# Patient Record
Sex: Female | Born: 1950 | Race: White | Hispanic: No | Marital: Married | Smoking: Never smoker
Health system: Southern US, Community
[De-identification: ages and names within clinical notes are randomized; demographics above are authoritative.]

## PROBLEM LIST (undated history)

## (undated) DIAGNOSIS — C569 Malignant neoplasm of unspecified ovary: Principal | ICD-10-CM

## (undated) DIAGNOSIS — R1900 Intra-abdominal and pelvic swelling, mass and lump, unspecified site: Secondary | ICD-10-CM

## (undated) DIAGNOSIS — N189 Chronic kidney disease, unspecified: Secondary | ICD-10-CM

## (undated) DIAGNOSIS — I499 Cardiac arrhythmia, unspecified: Secondary | ICD-10-CM

## (undated) DIAGNOSIS — I509 Heart failure, unspecified: Secondary | ICD-10-CM

## (undated) DIAGNOSIS — I5030 Unspecified diastolic (congestive) heart failure: Secondary | ICD-10-CM

## (undated) DIAGNOSIS — D649 Anemia, unspecified: Secondary | ICD-10-CM

## (undated) DIAGNOSIS — I10 Essential (primary) hypertension: Secondary | ICD-10-CM

## (undated) DIAGNOSIS — Z1379 Encounter for other screening for genetic and chromosomal anomalies: Secondary | ICD-10-CM

## (undated) HISTORY — DX: Malignant neoplasm of unspecified ovary: C56.9

## (undated) HISTORY — DX: Chronic kidney disease, unspecified: N18.9

## (undated) HISTORY — PX: APPENDECTOMY: SHX54

## (undated) HISTORY — DX: Intra-abdominal and pelvic swelling, mass and lump, unspecified site: R19.00

## (undated) HISTORY — DX: Anemia, unspecified: D64.9

## (undated) HISTORY — DX: Heart failure, unspecified: I50.9

## (undated) HISTORY — DX: Encounter for other screening for genetic and chromosomal anomalies: Z13.79

---

## 2006-10-04 ENCOUNTER — Emergency Department: Payer: Self-pay | Admitting: Emergency Medicine

## 2016-11-13 ENCOUNTER — Encounter: Payer: Self-pay | Admitting: Emergency Medicine

## 2016-11-13 ENCOUNTER — Emergency Department
Admission: EM | Admit: 2016-11-13 | Discharge: 2016-11-13 | Disposition: A | Discharge status: Home / Self Care | Attending: Student in an Organized Health Care Education/Training Program | Admitting: Student in an Organized Health Care Education/Training Program

## 2016-11-13 ENCOUNTER — Emergency Department

## 2016-11-13 DIAGNOSIS — R1031 Right lower quadrant pain: Secondary | ICD-10-CM | POA: Insufficient documentation

## 2016-11-13 DIAGNOSIS — R1903 Right lower quadrant abdominal swelling, mass and lump: Secondary | ICD-10-CM | POA: Diagnosis not present

## 2016-11-13 LAB — COMPREHENSIVE METABOLIC PANEL
ALBUMIN: 4 g/dL (ref 3.5–5.0)
ALK PHOS: 84 U/L (ref 38–126)
ALT: 8 U/L — ABNORMAL LOW (ref 14–54)
ANION GAP: 7 (ref 5–15)
AST: 33 U/L (ref 15–41)
BILIRUBIN TOTAL: 0.7 mg/dL (ref 0.3–1.2)
BUN: 8 mg/dL (ref 6–20)
CHLORIDE: 100 mmol/L — AB (ref 101–111)
CO2: 25 mmol/L (ref 22–32)
Calcium: 10 mg/dL (ref 8.9–10.3)
Creatinine, Ser: 0.5 mg/dL (ref 0.44–1.00)
GFR calc Af Amer: >60 mL/min (ref >60)
GFR calc non Af Amer: >60 mL/min (ref >60)
Glucose, Bld: 92 mg/dL (ref 65–99)
Potassium: 4.2 mmol/L (ref 3.5–5.1)
Sodium: 132 mmol/L — ABNORMAL LOW (ref 135–145)
Total Protein: 8.3 g/dL — ABNORMAL HIGH (ref 6.5–8.1)

## 2016-11-13 LAB — URINALYSIS, COMPLETE (UACMP) WITH MICROSCOPIC
Bacteria, UA: NONE SEEN
Bilirubin Urine: NEGATIVE
GLUCOSE, UA: NEGATIVE mg/dL
KETONES UR: NEGATIVE mg/dL
LEUKOCYTES UA: NEGATIVE
Nitrite: NEGATIVE
PROTEIN: NEGATIVE mg/dL
SQUAMOUS EPITHELIAL / LPF: NONE SEEN
Specific Gravity, Urine: 1.008 (ref 1.005–1.030)
pH: 7 (ref 5.0–8.0)

## 2016-11-13 LAB — CBC
HCT: 30.5 % — ABNORMAL LOW (ref 35.0–47.0)
HEMOGLOBIN: 10.4 g/dL — AB (ref 12.0–16.0)
MCH: 28.5 pg (ref 26.0–34.0)
MCHC: 34 g/dL (ref 32.0–36.0)
MCV: 83.9 fL (ref 80.0–100.0)
Platelets: 300 10*3/uL (ref 150–440)
RBC: 3.64 MIL/uL — ABNORMAL LOW (ref 3.80–5.20)
RDW: 14.6 % — ABNORMAL HIGH (ref 11.5–14.5)
WBC: 4.8 10*3/uL (ref 3.6–11.0)

## 2016-11-13 LAB — LIPASE, BLOOD: Lipase: 26 U/L (ref 11–51)

## 2016-11-13 MED ORDER — HYDROCODONE-ACETAMINOPHEN 5-325 MG PO TABS
1.0000 | ORAL_TABLET | Freq: Once | ORAL | Status: AC
Start: 2016-11-13 — End: 2016-11-13
  Administered 2016-11-13: 1 via ORAL
  Filled 2016-11-13: qty 1

## 2016-11-13 MED ORDER — IOPAMIDOL (ISOVUE-300) INJECTION 61%
75.0000 mL | Freq: Once | INTRAVENOUS | Status: AC | PRN
  Administered 2016-11-13: 75 mL via INTRAVENOUS
  Filled 2016-11-13: qty 75

## 2016-11-13 MED ORDER — ONDANSETRON HCL 4 MG PO TABS: 4.0000 mg | ORAL_TABLET | Freq: Every day | ORAL | 0 refills | Status: DC | PRN

## 2016-11-13 MED ORDER — SODIUM CHLORIDE 0.9 % IV BOLUS (SEPSIS)
1000.0000 mL | Freq: Once | INTRAVENOUS | Status: AC
  Administered 2016-11-13: 1000 mL via INTRAVENOUS

## 2016-11-13 MED ORDER — FENTANYL CITRATE (PF) 100 MCG/2ML IJ SOLN
50.0000 ug | INTRAMUSCULAR | Status: DC | PRN
  Administered 2016-11-13: 50 ug via INTRAVENOUS
  Filled 2016-11-13: qty 2

## 2016-11-13 MED ORDER — IOPAMIDOL (ISOVUE-300) INJECTION 61%
30.0000 mL | Freq: Once | INTRAVENOUS | Status: AC | PRN
  Administered 2016-11-13: 30 mL via ORAL
  Filled 2016-11-13: qty 30

## 2016-11-13 MED ORDER — ONDANSETRON HCL 4 MG/2ML IJ SOLN
4.0000 mg | Freq: Once | INTRAMUSCULAR | Status: AC
  Administered 2016-11-13: 4 mg via INTRAVENOUS
  Filled 2016-11-13: qty 2

## 2016-11-13 MED ORDER — ACETAMINOPHEN 325 MG PO TABS
650.0000 mg | ORAL_TABLET | Freq: Once | ORAL | Status: AC
  Administered 2016-11-13: 650 mg via ORAL
  Filled 2016-11-13: qty 2

## 2016-11-13 MED ORDER — TRAMADOL HCL 50 MG PO TABS
ORAL_TABLET | ORAL | Status: DC
Start: 2016-11-13 — End: 2016-11-13
  Filled 2016-11-13: qty 1

## 2016-11-13 MED ORDER — TRAMADOL HCL 50 MG PO TABS: 50.0000 mg | ORAL_TABLET | Freq: Four times a day (QID) | ORAL | 0 refills | Status: DC | PRN

## 2016-11-13 MED ORDER — TRAMADOL HCL 50 MG PO TABS
50.0000 mg | ORAL_TABLET | Freq: Once | ORAL | Status: AC
Start: 2016-11-13 — End: 2016-11-13
  Administered 2016-11-13: 50 mg via ORAL

## 2016-11-13 NOTE — ED Triage Notes (Signed)
Pt to ed with c/o right side abd pain intermittent x 3 weeks.  Also reports mucus in diarrhea.  Pt denies n/v.  Pt alert and oriented, rates abd pain at this time 8/10.

## 2016-11-13 NOTE — ED Provider Notes (Signed)
[facility] Emergency Department Provider Note    First MD Initiated Contact with Patient 11/13/16 1458     (approximate)  I have reviewed the triage vital signs and the nursing notes.   HISTORY  Chief Complaint Abdominal Pain    HPI Katie Hill is a 65 y.o. female presents due to complaint of 3 weeks of intermittent right lower quadrant abdominal pain described as a shooting pain right under her previous appendectomy scar. Patient states she's also been struggling to use the bathroom. Is normally regular about 2-3 weeks ago started having issues with constipation. Denies any blood or melena. Denies any fevers. Does feel nauseated but no vomiting. Denies any chest pain or shortness of breath.   History reviewed. No pertinent past medical history. History reviewed. No pertinent family history. History reviewed. No pertinent surgical history. There are no active problems to display for this patient.     Prior to Admission medications   Medication Sig Start Date End Date Taking? Authorizing Provider  ondansetron (ZOFRAN) 4 MG tablet Take 1 tablet (4 mg total) by mouth daily as needed for nausea or vomiting. 11/13/16 11/13/17  Merlyn Lot, MD  traMADol (ULTRAM) 50 MG tablet Take 1 tablet (50 mg total) by mouth every 6 (six) hours as needed. 11/13/16 11/13/17  Merlyn Lot, MD    Allergies Patient has no known allergies.    Social History Social History  Substance Use Topics  . Smoking status: Never Smoker  . Smokeless tobacco: Never Used  . Alcohol use Yes    Review of Systems Patient denies headaches, rhinorrhea, blurry vision, numbness, shortness of breath, chest pain, edema, cough, abdominal pain, nausea, vomiting, diarrhea, dysuria, fevers, rashes or hallucinations unless otherwise stated above in HPI. ____________________________________________   PHYSICAL EXAM:  VITAL SIGNS: Vitals:   11/13/16 1406  BP: (!) 116/58  Pulse: 83   Resp: 16  Temp: 98.1 F (36.7 C)    Constitutional: Alert and oriented. Well appearing and in no acute distress. Eyes: Conjunctivae are normal.  Head: Atraumatic. Nose: No congestion/rhinnorhea. Mouth/Throat: Mucous membranes are moist.   Neck: No stridor. Painless ROM.  Cardiovascular: Normal rate, regular rhythm. Grossly normal heart sounds.  Good peripheral circulation. Respiratory: Normal respiratory effort.  No retractions. Lungs CTAB. Gastrointestinal: Soft with mild ttp of RLQ, no guarding or rebound. No distention. No abdominal bruits. No CVA tenderness. Musculoskeletal: No lower extremity tenderness nor edema.  No joint effusions. Neurologic:  Normal speech and language. No gross focal neurologic deficits are appreciated. No facial droop Skin:  Skin is warm, dry and intact. No rash noted. Psychiatric: Mood and affect are normal. Speech and behavior are normal.  ____________________________________________   LABS (all labs ordered are listed, but only abnormal results are displayed)  Results for orders placed or performed during the hospital encounter of 11/13/16 (from the past 24 hour(s))  Lipase, blood     Status: None   Collection Time: 11/13/16  2:09 PM  Result Value Ref Range   Lipase 26 11 - 51 U/L  Comprehensive metabolic panel     Status: Abnormal   Collection Time: 11/13/16  2:09 PM  Result Value Ref Range   Sodium 132 (L) 135 - 145 mmol/L   Potassium 4.2 3.5 - 5.1 mmol/L   Chloride 100 (L) 101 - 111 mmol/L   CO2 25 22 - 32 mmol/L   Glucose, Bld 92 65 - 99 mg/dL   BUN 8 6 - 20 mg/dL   Creatinine, Ser  0.50 0.44 - 1.00 mg/dL   Calcium 10.0 8.9 - 10.3 mg/dL   Total Protein 8.3 (H) 6.5 - 8.1 g/dL   Albumin 4.0 3.5 - 5.0 g/dL   AST 33 15 - 41 U/L   ALT 8 (L) 14 - 54 U/L   Alkaline Phosphatase 84 38 - 126 U/L   Total Bilirubin 0.7 0.3 - 1.2 mg/dL   GFR calc non Af Amer >60 >60 mL/min   GFR calc Af Amer >60 >60 mL/min   Anion gap 7 5 - 15  CBC     Status:  Abnormal   Collection Time: 11/13/16  2:09 PM  Result Value Ref Range   WBC 4.8 3.6 - 11.0 K/uL   RBC 3.64 (L) 3.80 - 5.20 MIL/uL   Hemoglobin 10.4 (L) 12.0 - 16.0 g/dL   HCT 30.5 (L) 35.0 - 47.0 %   MCV 83.9 80.0 - 100.0 fL   MCH 28.5 26.0 - 34.0 pg   MCHC 34.0 32.0 - 36.0 g/dL   RDW 14.6 (H) 11.5 - 14.5 %   Platelets 300 150 - 440 K/uL  Urinalysis, Complete w Microscopic     Status: Abnormal   Collection Time: 11/13/16  2:09 PM  Result Value Ref Range   Color, Urine STRAW (A) YELLOW   APPearance CLEAR (A) CLEAR   Specific Gravity, Urine 1.008 1.005 - 1.030   pH 7.0 5.0 - 8.0   Glucose, UA NEGATIVE NEGATIVE mg/dL   Hgb urine dipstick SMALL (A) NEGATIVE   Bilirubin Urine NEGATIVE NEGATIVE   Ketones, ur NEGATIVE NEGATIVE mg/dL   Protein, ur NEGATIVE NEGATIVE mg/dL   Nitrite NEGATIVE NEGATIVE   Leukocytes, UA NEGATIVE NEGATIVE   RBC / HPF 0-5 0 - 5 RBC/hpf   WBC, UA 0-5 0 - 5 WBC/hpf   Bacteria, UA NONE SEEN NONE SEEN   Squamous Epithelial / LPF NONE SEEN NONE SEEN   ____________________________________________ ___________________________________________  RADIOLOGY  I personally reviewed all radiographic images ordered to evaluate for the above acute complaints and reviewed radiology reports and findings.  These findings were personally discussed with the patient.  Please see medical record for radiology report.  ____________________________________________   PROCEDURES  Procedure(s) performed:  Procedures    Critical Care performed: no ____________________________________________   INITIAL IMPRESSION / ASSESSMENT AND PLAN / ED COURSE  Pertinent labs & imaging results that were available during my care of the patient were reviewed by me and considered in my medical decision making (see chart for details).  DDX: appendicitis, stone, pyelo, msk strain, shingles, colitis, diverticulitis,  mass  Katie Hill is a 66 y.o. who presents to the ED with right-sided  abdominal pain as described above.   CT imaging ordered to evaluate the above differential due to the patient's pain. Unfortunately initial CT scan was limited as was ordered as a CT with contrast but the contrast extravasated. CT does show evidence of large probable uterine mass but unable to fully characterize. After discussing with radiology will order complete CT imaging of the chest abdomen and pelvis due to the likelihood that this is malignant process.  The patient will be placed on continuous pulse oximetry and telemetry for monitoring.  Laboratory evaluation will be sent to evaluate for the above complaints.      Clinical Course as of Nov 14 2014  Mon Nov 13, 2016  1931 I discussed the imaging with the patient and her husband at bedside. I have also spoken with Dr. Kenton Kingfisher of OB/GYN who  kindly agrees to see patient in his clinic tomorrow 1. She not having any urinary retention. There is no evidence of bowel obstruction. Pain seems to be well-controlled. She does not have any lower extremity edema to suggest critical stenosis of the IVC.  [PR]    Clinical Course User Index [PR] Merlyn Lot, MD   ----------------------------------------- 8:12 PM on 11/13/2016 -----------------------------------------  Patient requesting discharge home at this point. States pain is controlled. She is tolerating oral hydration and in no acute distress with close follow-up arranged tomorrow I do feel that this is an appropriate option. Discussed signs and symptoms for which she should return immediately to the hospital.  Have discussed with the patient and available family all diagnostics and treatments performed thus far and all questions were answered to the best of my ability. The patient demonstrates understanding and agreement with plan.  ____________________________________________   FINAL CLINICAL IMPRESSION(S) / ED DIAGNOSES  Final diagnoses:  Right lower quadrant abdominal pain  Right lower  quadrant abdominal mass      NEW MEDICATIONS STARTED DURING THIS VISIT:  New Prescriptions   ONDANSETRON (ZOFRAN) 4 MG TABLET    Take 1 tablet (4 mg total) by mouth daily as needed for nausea or vomiting.   TRAMADOL (ULTRAM) 50 MG TABLET    Take 1 tablet (50 mg total) by mouth every 6 (six) hours as needed.     Note:  This document was prepared using Dragon voice recognition software and may include unintentional dictation errors.    Merlyn Lot, MD 11/13/16 2016

## 2016-11-13 NOTE — Discharge Instructions (Signed)
Be sure to follow up with Dr. Kenton Kingfisher in clinic tomorrow at 1:00PM.     You have been seen in the emergency department for emergency care. It is important that you contact your own  doctor, specialist or the closest clinic for follow-up care. Please bring this instruction sheet, all medications and X-ray copies with you when you are seen for follow-up care.  Determining the exact cause for all patients with abdominal pain is extremely difficult in the emergency department. Our primary focus is to rule-out immediate life-threatening diseases. If no immediate source of pain is found the definitive diagnosis frequently needs to be determined over time.Many times your primary care physician can determine the cause by following the symptoms over time. Sometimes, specialist are required such as Gastroenterologists, Gynecologists, Urologists or Surgeons. Please return immediately to the Emergency Department for fever>101, Vomiting or Intractable Pain. You should return to the emergency department or see your primary care provider in 12-24hrs if your pain is no better and sooner if your pain becomes worse.

## 2016-11-14 ENCOUNTER — Telehealth: Payer: Self-pay | Admitting: Obstetrics & Gynecology

## 2016-11-14 ENCOUNTER — Ambulatory Visit (INDEPENDENT_AMBULATORY_CARE_PROVIDER_SITE_OTHER): Admitting: Obstetrics & Gynecology

## 2016-11-14 ENCOUNTER — Encounter: Payer: Self-pay | Admitting: Obstetrics & Gynecology

## 2016-11-14 VITALS — BP 124/72 | HR 86 | Ht 67.0 in | Wt 122.0 lb

## 2016-11-14 DIAGNOSIS — R1900 Intra-abdominal and pelvic swelling, mass and lump, unspecified site: Secondary | ICD-10-CM

## 2016-11-14 NOTE — Telephone Encounter (Signed)
Patient is schedule 11/14/16 with [facility]

## 2016-11-14 NOTE — Patient Instructions (Signed)
[place] at [place] at 10:45  Pelvic Mass A pelvic mass is an abnormal growth in the pelvis. The pelvis is the area between your hip bones. It includes the bladder and the rectum in males and females, and also the uterus and ovaries in females. What are the causes? Many things can cause a pelvic mass, including:  Cancer.  Fibroids of the uterus.  Ovarian cysts.  Infection.  Ectopic pregnancy.  What are the signs or symptoms? Symptoms of a pelvic mass may include:  Cramping.  Nausea.  Diarrhea.  Fever.  Vomiting.  Weakness.  Pain in the pelvis, side, or back.  Weight loss.  Constipation.  Problems with vaginal bleeding, including: ? Light or heavy bleeding with or without blood clots. ? Irregular menstruation. ? Pain with menstruation.  Problems with urination, including: ? Frequent urination. ? Inability to empty the bladder completely. ? Urinating very small amounts. ? Pain with urination. ? Bloody urine.  Some pelvic masses do not cause symptoms. How is this diagnosed? To make a diagnosis, your health care provider will need to learn more about the mass. You may have tests or procedures done, such as:  Blood tests.  X-rays.  Ultrasound.  CT scan.  MRI.  A surgery to look inside of your abdomen with cameras (laparoscopy).  A biopsy that is performed with a needle or during laparoscopy or surgery.  In some cases, what seemed like a pelvic mass may actually be something else, such as a mass in one of the organs that are near the pelvis, an infection (abscess) or scar tissue (adhesions) that formed after a surgery. How is this treated? Treatment will depend on the cause of the mass. Follow these instructions at home: What you need to do at home will depend on the cause of the mass. Follow the instructions that your health care provider gives to you. In general:  Keep all follow-up visits as directed by your health care  provider. This is important.  Take medicines only as directed by your health care provider.  Follow any restrictions that are given to you by your health care provider.  Contact a health care provider if:  You develop new symptoms. Get help right away if:  You vomit bright red blood or vomit material that looks like coffee grounds.  You have blood in your stools, or the stools turn black and tarry.  You have an abnormal or increased amount of vaginal bleeding.  You have a fever.  You develop easy bruising or bleeding.  You develop sudden or worsening pain that is not controlled by your medicine.  You feel worsening weakness, or you have a fainting episode.  You feel that the mass has suddenly gotten larger.  You develop severe bloating in your abdomen or your pelvis.  You cannot pass any urine.  You are unable to have a bowel movement. This information is not intended to replace advice given to you by your health care provider. Make sure you discuss any questions you have with your health care provider. Document Released: 07/04/2006 Document Revised: 09/02/2015 Document Reviewed: 11/10/2013 Elsevier Interactive Patient Education  2018 [employer].

## 2016-11-14 NOTE — Progress Notes (Signed)
  HPI: Patient is a 66 y.o. [id number] who LMP was No LMP recorded. Patient is postmenopausal., presents today for a problem visit.  She complains of recent findings of Right adnexal mass by CT - Pelvis.  Pt has had symptoms of right lower quadrant pain for 3-4 weeks, also has had nausea, bloating, no change in weight, urinary frequency, and change in bowel habits with more diffucult and stringy BM's..  Previous evaluation: emergency room visit on 11/13/16. Prior Diagnosis: none. Previous Treatment: none.  PMHx: She  has no past medical history on file. Also,  has a past surgical history that includes Appendectomy., family history is not on file.,  reports that she has never smoked. She has never used smokeless tobacco. She reports that she drinks alcohol. She reports that she does not use drugs.  She has a current medication list which includes the following prescription(s): ondansetron and tramadol. Also, has No Known Allergies.  Review of Systems  Constitutional: Negative for chills, fever and malaise/fatigue.  HENT: Negative for congestion, sinus pain and sore throat.   Eyes: Negative for blurred vision and pain.  Respiratory: Negative for cough and wheezing.   Cardiovascular: Negative for chest pain and leg swelling.  Gastrointestinal: Negative for abdominal pain, constipation, diarrhea, heartburn, nausea and vomiting.  Genitourinary: Negative for dysuria, frequency, hematuria and urgency.  Musculoskeletal: Negative for back pain, joint pain, myalgias and neck pain.  Skin: Negative for itching and rash.  Neurological: Negative for dizziness, tremors and weakness.  Endo/Heme/Allergies: Does not bruise/bleed easily.  Psychiatric/Behavioral: Negative for depression. The patient is not nervous/anxious and does not have insomnia.   All other systems reviewed and are negative.  Objective: BP 124/72 (BP Location: Left Arm, Patient Position: Sitting, Cuff Size: Normal)   Pulse 86   Ht 5\' 7"   (1.702 m)   Wt 122 lb (55.3 kg)   BMI 19.11 kg/m  Physical Exam  Constitutional: She is oriented to person, place, and time. She appears well-developed and well-nourished. No distress.  Abdominal: Normal appearance. There is generalized tenderness and tenderness in the right lower quadrant.  Musculoskeletal: Normal range of motion.  Neurological: She is alert and oriented to person, place, and time.  Skin: Skin is warm and dry.  Psychiatric: She has a normal mood and affect.  Vitals reviewed.  ASSESSMENT/PLAN:    Visit Diagnoses    Pelvic mass in female    -  Primary   Relevant Orders   Ambulatory referral to Gynecologic Oncology    CA125 pending from last night  Counseled as to concern for ovarian cancer and its prognosis and treatment options (more info to be provided by Dr Theora Gianotti tomorrow). Will assist with surgery if able to be done at [facility].  A total of 45 minutes were spent face-to-face with the patient during this encounter and over half of that time dealt with counseling and coordination of care.  Barnett Applebaum, MD, [facility], [facility] 11/14/2016  1:39 PM

## 2016-11-14 NOTE — Telephone Encounter (Signed)
-----   Message from Gae Dry, MD sent at 11/14/2016  7:04 AM EDT ----- Regarding: appt Add to my schedule today at 1:00; pt aware but still can call to get any info you need or to give additional instructions

## 2016-11-15 ENCOUNTER — Other Ambulatory Visit: Payer: Self-pay

## 2016-11-15 ENCOUNTER — Other Ambulatory Visit: Payer: Self-pay | Admitting: Obstetrics and Gynecology

## 2016-11-15 ENCOUNTER — Telehealth: Payer: Self-pay

## 2016-11-15 ENCOUNTER — Inpatient Hospital Stay: Attending: Obstetrics and Gynecology | Admitting: Obstetrics and Gynecology

## 2016-11-15 DIAGNOSIS — C768 Malignant neoplasm of other specified ill-defined sites: Secondary | ICD-10-CM

## 2016-11-15 DIAGNOSIS — C569 Malignant neoplasm of unspecified ovary: Secondary | ICD-10-CM | POA: Insufficient documentation

## 2016-11-15 DIAGNOSIS — Z7982 Long term (current) use of aspirin: Secondary | ICD-10-CM | POA: Insufficient documentation

## 2016-11-15 DIAGNOSIS — R1900 Intra-abdominal and pelvic swelling, mass and lump, unspecified site: Secondary | ICD-10-CM | POA: Insufficient documentation

## 2016-11-15 DIAGNOSIS — G893 Neoplasm related pain (acute) (chronic): Secondary | ICD-10-CM | POA: Insufficient documentation

## 2016-11-15 DIAGNOSIS — M545 Low back pain: Secondary | ICD-10-CM | POA: Insufficient documentation

## 2016-11-15 DIAGNOSIS — Z79899 Other long term (current) drug therapy: Secondary | ICD-10-CM | POA: Insufficient documentation

## 2016-11-15 DIAGNOSIS — E871 Hypo-osmolality and hyponatremia: Secondary | ICD-10-CM | POA: Insufficient documentation

## 2016-11-15 DIAGNOSIS — Z5111 Encounter for antineoplastic chemotherapy: Secondary | ICD-10-CM | POA: Insufficient documentation

## 2016-11-15 DIAGNOSIS — D649 Anemia, unspecified: Secondary | ICD-10-CM | POA: Insufficient documentation

## 2016-11-15 NOTE — Progress Notes (Signed)
Gynecologic Oncology Consult Visit   Referring Provider: Dr. Barnett Applebaum  Chief Concern: symptomatic right adenexal mass  Subjective:  Katie Hill is a 66 y.o. G0P0 female who is seen in consultation from Dr. Kenton Kingfisher for symptomatic right adnexal mass .  She is a usually healthy patient who presented to the ER 11/13/2016 with symptoms of right lower quadrant pain for 3-4 weeks, associated with nausea, bloating, no change in weight, urinary frequency, and change in bowel habits with more diffucult and stringy BM's.  CT CHEST, ABDOMEN, AND PELVIS WITH CONTRAST  Cardiovascular: The heart size is normal. No pericardial effusion. No thoracic aortic aneurysm.  Mediastinum/Nodes: No mediastinal lymphadenopathy. There is no hilar lymphadenopathy. There is no axillary lymphadenopathy. The esophagus has normal imaging features.  Lungs/Pleura: Biapical pleural-parenchymal scarring evident. Atelectasis noted in the dependent lung bases. 7 mm left lower lobe pulmonary nodule seen on image 89. Several tiny subpleural nodules are seen in the extreme right lung base (image 115 series 4).  Musculoskeletal: Bone windows reveal no worrisome lytic or sclerotic osseous lesions.  Hepatobiliary: No focal abnormality within the liver parenchyma. There is no evidence for gallstones, gallbladder wall thickening, or pericholecystic fluid. No intrahepatic or extrahepatic biliary dilation.  Pancreas: No focal mass lesion. No dilatation of the main duct. No intraparenchymal cyst. No peripancreatic edema.  Spleen: No splenomegaly. No focal mass lesion.  Adrenals/Urinary Tract: No adrenal nodule or mass. 5 mm low-density cortical lesion interpolar right kidney likely a cyst. Left kidney unremarkable. Ureters are unremarkable. Bladder is displaced by the large central pelvic mass described below.  Stomach/Bowel: Stomach is nondistended. No gastric wall thickening. No evidence of outlet  obstruction. Duodenum is normally positioned as is the ligament of Treitz. No small bowel wall thickening. No small bowel dilatation. The terminal ileum is normal. The appendix is not visualized, but there is no edema or inflammation in the region of the cecum. Colon is unremarkable except for the mid sigmoid segment that is draped across the cranial aspect of the large central pelvic mass. In this region there is focal wall thickening and imaging features are suspicious for invasion of the colonic wall in the mid sigmoid colon (see images 43 and 44 of coronal series 6 which correspond to image 96 and 97 of axial series 2).  Vascular/Lymphatic: No abdominal aortic aneurysm. Bulky necrotic retroperitoneal lymphadenopathy is evident. 5.3 x 4.4 cm right para-aortic nodal mass markedly displaces the IVC posteriorly, generating prominent mass-effect on the vessel. Left para-aortic nodal conglomeration just caudal to the left renal vein measures 4.7 x 3.7 cm. 4.3 x 4.8 cm necrotic right common iliac lymph node is evident. Numerous other smaller retroperitoneal lymph nodes are evident. There is a necrotic nodal mass in the left pelvic sidewall measuring 4.8 x 2.6 cm on image 103 of series 2. The right external iliac vein is markedly attenuated along the right pelvic sidewall and patency of this vessel cannot be confirmed by CT. Similar marked attenuation of the left common iliac vein noted due to mass-effect from the central pelvic mass and the left pelvic sidewall lymphadenopathy. 2.2 cm necrotic left internal iliac lymph node is visualized on image 93.  Reproductive: 9.6 x 11.2 x 12.6 cm heterogeneous cystic and solid or solid necrotic mass identified in the central pelvis. Axial imaging suggests at the left gonadal vessels track into the left lateral aspect of this lesion raising the question of left ovarian origin. The right gonadal veins are less clearly associated with this  lesion although the lesion does appear more bulky in the right anatomic pelvis. Difficult to assess on sagittal reformations, but while this central pelvic mass generates substantial mass-effect on the uterus, some images suggest that it is separate from the uterus.  Other: Small amount of free fluid is identified in the pelvis.  Musculoskeletal: Bone windows reveal no worrisome lytic or sclerotic osseous lesions.  IMPRESSION: 1. Large heterogeneous central pelvic mass measures up to 12.6 cm. The lesion generates substantial mass-effect on the uterus, bladder, and pelvic sidewall anatomy. The mass appears to invade the mid sigmoid colon. Etiology of the central pelvic mass is not definitive by CT, but ovarian primary is suspected. The lesion becomes indistinguishable from the posterior uterus on some images and uterine origin is possible, but the uterus does not appear to be the epicenter of the mass and appears to be more displaced by it. MRI of the pelvis without and with contrast may prove helpful to better delineate the relationship of the uterus to the central pelvic mass although it may not be able to definitively localize the origin. 2. Bulky retroperitoneal and left pelvic sidewall lymphadenopathy. The retroperitoneal lymphadenopathy generates substantial mass-effect on the IVC and left renal vein. The external iliac veins along each pelvic sidewall are markedly attenuated by the mass/lymphadenopathy. Patency of these vessels cannot be definitely confirmed on this exam.  She does not have regular medical cancer and has not has screening mammogram, colonoscopy, or Pap smears. She has not had an abnormal Pap.   Problem List: Patient Active Problem List   Diagnosis Date Noted  . Pelvic mass 11/15/2016    Past Medical History: Past Medical History:  Diagnosis Date  . Pelvic mass in female     Past Surgical History: Past Surgical History:  Procedure Laterality  Date  . APPENDECTOMY      Past Gynecologic History:  Menarche: 17 Last Menstrual Period: 24 years ago History of Abnormal pap: no Last pap: years ago   OB History:  OB History  Gravida Para Term Preterm AB Living  0 0 0 0 0 0  SAB TAB Ectopic Multiple Live Births  0 0 0 0 0        Family History: Family History  Problem Relation Age of Onset  . Throat cancer Cousin   . Throat cancer Cousin     Social History: Social History   Social History  . Marital status: Married    Spouse name: N/A  . Number of children: N/A  . Years of education: N/A   Occupational History  . Not on file.   Social History Main Topics  . Smoking status: Never Smoker  . Smokeless tobacco: Never Used  . Alcohol use Yes     Comment: occasional   . Drug use: No  . Sexual activity: Yes    Birth control/ protection: Post-menopausal   Other Topics Concern  . Not on file   Social History Narrative  . No narrative on file    Allergies: No Known Allergies  Current Medications: Current Outpatient Prescriptions  Medication Sig Dispense Refill  . ondansetron (ZOFRAN) 4 MG tablet Take 1 tablet (4 mg total) by mouth daily as needed for nausea or vomiting. 14 tablet 0  . traMADol (ULTRAM) 50 MG tablet Take 1 tablet (50 mg total) by mouth every 6 (six) hours as needed. 20 tablet 0   No current facility-administered medications for this visit.     Review of Systems General: no complaints  HEENT: no complaints  Lungs: no complaints  Cardiac: no complaints  GI: constipation; RLQ pelvic pain  GU: urinary incontinence and frequency with incomplete emptying of the bladder  Musculoskeletal: back pain  Extremities: no complaints  Skin: no complaints  Neuro: no complaints  Endocrine: no complaints  Psych: no complaints       Objective:  Physical Examination:  BP 114/68   Pulse 77   Temp 98.4 F (36.9 C) (Tympanic)   Resp 18   Ht 5\' 7"  (1.702 m)   Wt 118 lb 12.8 oz (53.9 kg)    BMI 18.61 kg/m    ECOG Performance Status: 1 - Symptomatic but completely ambulatory  General appearance: alert, cooperative and appears stated age HEENT:PERRLA, extra ocular movement intact and sclera clear, anicteric Lymph node survey: non-palpable, axillary, inguinal, supraclavicular Cardiovascular: regular rate and rhythm Respiratory: normal air entry, lungs clear to auscultation Breast exam: breasts appear normal, no suspicious masses, no skin or nipple changes or axillary nodes. Abdomen: soft, nondistended, tenderness moderate in the RLQ, without guarding, without rebound, mass located in the RLQ, no hepatosplenomegaly, no hernias and well healed incision Back: inspection of back is normal Extremities: extremities normal, atraumatic, no cyanosis or edema Skin exam - normal coloration and turgor, no rashes, no suspicious skin lesions noted. Neurological exam reveals alert, oriented, normal speech, no focal findings or movement disorder noted.  Pelvic: exam chaperoned by nurse;  Vulva: normal appearing vulva with no masses, tenderness or lesions; Vagina: normal vagina; Adnexa: tenderness right with large immobile mass present right side, size 12-14 cm; Uterus: unable to determine size due to mass ; Cervix: no lesions; Rectal: confirms impingement of the mass on the large bowel and limited mobility.    Lab Review Labs on site today: pending  Radiologic Imaging: reviewed    Assessment:  Katie Hill is a 67 y.o. 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 Exam concerning for limited mobility of mass and pelvic structures and may indicate unresectable disease.    Medical co-morbidities complicating care: prior abdominal surgery.  Plan:   Problem List Items Addressed This Visit      Other   Pelvic mass   Relevant Orders   CT Biopsy   Protime-INR   APTT    CEA   CA 125   Pap liquid-based and HPV (high risk)      We discussed options for management and recommended radiologic guided biopsy asap with tumor markers. Based on these findings we can proceed with neoadjuvant therapy. If ovarian cancer anticipate 3-4 cycles of paclitaxel/platin based therapy with interval debulking. Bevacizumab would not be advisable given concern for bowel involvement.   The patient's diagnosis, an outline of the further diagnostic and laboratory studies which will be required, the recommendation, and alternatives were discussed.  All questions were answered to the patient's satisfaction.     Gillis Ends, MD    CC:  [address],  [address], [place] [id number]

## 2016-11-15 NOTE — Progress Notes (Signed)
  Oncology Nurse Navigator Documentation Orders placed for STAT Ct guided biopsy. Lab orders placed. Provided contact information for any future questions after today. Navigator Location: [facility] (11/15/16 1200)   )Navigator Encounter Type: Initial GynOnc (11/15/16 1100)                                                    Time Spent with Patient: 60 (11/15/16 1100)

## 2016-11-15 NOTE — Telephone Encounter (Signed)
  Oncology Nurse Navigator Documentation Notified Katie Hill to come to cancer center at [place] on 8/9 for labs then she [facility] guided biopsy at 0900 for 1000 scheduled biopsy time. Educated to remain npo for 8 hours before. She may take any heart or bp medications in the early am with sip of water. Denies taking any of these medications. Instructed that she will need driver for this biopsy. Read back performed. Navigator Location: [facility] (11/15/16 1700)   )                                                     Time Spent with Patient: 15 (11/15/16 1700)

## 2016-11-15 NOTE — Patient Instructions (Signed)
Constipation, Adult °Constipation is when a person: °· Poops (has a bowel movement) fewer times in a week than normal. °· Has a hard time pooping. °· Has poop that is dry, hard, or bigger than normal. ° °Follow these instructions at home: °Eating and drinking ° °· Eat foods that have a lot of fiber, such as: °? Fresh fruits and vegetables. °? Whole grains. °? Beans. °· Eat less of foods that are high in fat, low in fiber, or overly processed, such as: °? French fries. °? Hamburgers. °? Cookies. °? Candy. °? Soda. °· Drink enough fluid to keep your pee (urine) clear or pale yellow. °General instructions °· Exercise regularly or as told by your doctor. °· Go to the restroom when you feel like you need to poop. Do not hold it in. °· Take over-the-counter and prescription medicines only as told by your doctor. These include any fiber supplements. °· Do pelvic floor retraining exercises, such as: °? Doing deep breathing while relaxing your lower belly (abdomen). °? Relaxing your pelvic floor while pooping. °· Watch your condition for any changes. °· Keep all follow-up visits as told by your doctor. This is important. °Contact a doctor if: °· You have pain that gets worse. °· You have a fever. °· You have not pooped for 4 days. °· You throw up (vomit). °· You are not hungry. °· You lose weight. °· You are bleeding from the anus. °· You have thin, pencil-like poop (stool). °Get help right away if: °· You have a fever, and your symptoms suddenly get worse. °· You leak poop or have blood in your poop. °· Your belly feels hard or bigger than normal (is bloated). °· You have very bad belly pain. °· You feel dizzy or you faint. °This information is not intended to replace advice given to you by your health care provider. Make sure you discuss any questions you have with your health care provider. °Document Released: 09/13/2007 Document Revised: 10/15/2015 Document Reviewed: 09/15/2015 °[employer]vier Interactive Patient Education ©  2017 [employer] ° °

## 2016-11-15 NOTE — Progress Notes (Signed)
Pt here for pelvic mass- started having pain in pelvic and went to ER, then got sent to [facility] and then referred to gyn

## 2016-11-16 ENCOUNTER — Inpatient Hospital Stay

## 2016-11-16 ENCOUNTER — Ambulatory Visit: Payer: No Typology Code available for payment source

## 2016-11-16 ENCOUNTER — Ambulatory Visit
Admission: RE | Admit: 2016-11-16 | Discharge: 2016-11-16 | Disposition: A | Discharge status: Home / Self Care | Source: Ambulatory Visit | Attending: Obstetrics and Gynecology | Admitting: Obstetrics and Gynecology

## 2016-11-16 DIAGNOSIS — E871 Hypo-osmolality and hyponatremia: Secondary | ICD-10-CM | POA: Diagnosis not present

## 2016-11-16 DIAGNOSIS — R1031 Right lower quadrant pain: Secondary | ICD-10-CM | POA: Diagnosis not present

## 2016-11-16 DIAGNOSIS — C779 Secondary and unspecified malignant neoplasm of lymph node, unspecified: Secondary | ICD-10-CM | POA: Diagnosis not present

## 2016-11-16 DIAGNOSIS — Z7982 Long term (current) use of aspirin: Secondary | ICD-10-CM | POA: Diagnosis not present

## 2016-11-16 DIAGNOSIS — R1900 Intra-abdominal and pelvic swelling, mass and lump, unspecified site: Secondary | ICD-10-CM | POA: Diagnosis present

## 2016-11-16 DIAGNOSIS — Z79899 Other long term (current) drug therapy: Secondary | ICD-10-CM | POA: Diagnosis not present

## 2016-11-16 DIAGNOSIS — G893 Neoplasm related pain (acute) (chronic): Secondary | ICD-10-CM | POA: Diagnosis not present

## 2016-11-16 DIAGNOSIS — Z9889 Other specified postprocedural states: Secondary | ICD-10-CM | POA: Diagnosis not present

## 2016-11-16 DIAGNOSIS — R590 Localized enlarged lymph nodes: Secondary | ICD-10-CM | POA: Diagnosis not present

## 2016-11-16 DIAGNOSIS — Z5111 Encounter for antineoplastic chemotherapy: Secondary | ICD-10-CM | POA: Diagnosis present

## 2016-11-16 DIAGNOSIS — D649 Anemia, unspecified: Secondary | ICD-10-CM | POA: Diagnosis not present

## 2016-11-16 DIAGNOSIS — Z800 Family history of malignant neoplasm of digestive organs: Secondary | ICD-10-CM | POA: Diagnosis not present

## 2016-11-16 DIAGNOSIS — C768 Malignant neoplasm of other specified ill-defined sites: Secondary | ICD-10-CM | POA: Diagnosis not present

## 2016-11-16 DIAGNOSIS — C569 Malignant neoplasm of unspecified ovary: Secondary | ICD-10-CM | POA: Diagnosis not present

## 2016-11-16 DIAGNOSIS — M545 Low back pain: Secondary | ICD-10-CM | POA: Diagnosis not present

## 2016-11-16 LAB — APTT: aPTT: 31 seconds (ref 24–36)

## 2016-11-16 LAB — PROTIME-INR
INR: 1.07
PROTHROMBIN TIME: 13.9 s (ref 11.4–15.2)

## 2016-11-16 MED ORDER — FENTANYL CITRATE (PF) 100 MCG/2ML IJ SOLN
INTRAMUSCULAR | Status: AC | PRN
  Administered 2016-11-16 (×2): 50 ug via INTRAVENOUS

## 2016-11-16 MED ORDER — SODIUM CHLORIDE 0.9 % IV SOLN
INTRAVENOUS | Status: DC
  Administered 2016-11-16: 11:00:00 via INTRAVENOUS

## 2016-11-16 MED ORDER — HYDROCODONE-ACETAMINOPHEN 5-325 MG PO TABS: 1.0000 | ORAL_TABLET | ORAL | Status: DC | PRN

## 2016-11-16 MED ORDER — FENTANYL CITRATE (PF) 100 MCG/2ML IJ SOLN
INTRAMUSCULAR | Status: AC
  Filled 2016-11-16: qty 4

## 2016-11-16 MED ORDER — MIDAZOLAM HCL 2 MG/2ML IJ SOLN
INTRAMUSCULAR | Status: AC | PRN
  Administered 2016-11-16 (×2): 1 mg via INTRAVENOUS

## 2016-11-16 MED ORDER — MIDAZOLAM HCL 2 MG/2ML IJ SOLN
INTRAMUSCULAR | Status: AC
Start: 2016-11-16 — End: 2016-11-16
  Filled 2016-11-16: qty 4

## 2016-11-16 NOTE — H&P (Signed)
Chief Complaint: Patient was seen in consultation today for pelvic, abdominal and retroperitoneal masses at the request of Gillis Ends  Referring Physician(s): Gillis Ends  Patient Status: [place]  History of Present Illness: Katie Hill is a 66 y.o. female who recently presented to the ER after 3-4 weeks of RLQ pain.  Ct imaging revealed a large pelvic mass and extensive adenopathy.  Today she is in her normal state of health and has no active complaints.   Past Medical History:  Diagnosis Date  . Pelvic mass in female     Past Surgical History:  Procedure Laterality Date  . APPENDECTOMY      Allergies: Patient has no known allergies.  Medications: Prior to Admission medications   Medication Sig Start Date End Date Taking? Authorizing Provider  traMADol (ULTRAM) 50 MG tablet Take 1 tablet (50 mg total) by mouth every 6 (six) hours as needed. 11/13/16 11/13/17 Yes Merlyn Lot, MD  ondansetron (ZOFRAN) 4 MG tablet Take 1 tablet (4 mg total) by mouth daily as needed for nausea or vomiting. 11/13/16 11/13/17  Merlyn Lot, MD     Family History  Problem Relation Age of Onset  . Throat cancer Cousin   . Throat cancer Cousin     Social History   Social History  . Marital status: Married    Spouse name: N/A  . Number of children: N/A  . Years of education: N/A   Social History Main Topics  . Smoking status: Never Smoker  . Smokeless tobacco: Never Used  . Alcohol use Yes     Comment: occasional   . Drug use: No  . Sexual activity: Yes    Birth control/ protection: Post-menopausal   Other Topics Concern  . None   Social History Narrative  . None    ECOG Status: 1 - Symptomatic but completely ambulatory  Review of Systems: A 12 point ROS discussed and pertinent positives are indicated in the HPI above.  All other systems are negative.  Review of Systems  Vital Signs: BP 114/77   Pulse 81   Temp 97.8 F (36.6  C) (Oral)   Resp 12   Ht 5\' 7"  (1.702 m)   Wt 118 lb (53.5 kg)   SpO2 96%   BMI 18.48 kg/m   Physical Exam  Mallampati Score:  MD Evaluation Airway: WNL Heart: WNL Abdomen: WNL Chest/ Lungs: WNL ASA  Classification: 2 Mallampati/Airway Score: One  Imaging: Ct Chest W Contrast  Result Date: 11/13/2016 CLINICAL DATA:  Right side abdominal pain for 3 weeks. Intraabdominal abnormality seen on noncontrast CT earlier today. EXAM: CT CHEST, ABDOMEN, AND PELVIS WITH CONTRAST TECHNIQUE: Multidetector CT imaging of the chest, abdomen and pelvis was performed following the standard protocol during bolus administration of intravenous contrast. CONTRAST:  63mL ISOVUE-300 IOPAMIDOL (ISOVUE-300) INJECTION 61% COMPARISON:  Noncontrast abdomen and pelvis CT from earlier today. FINDINGS: CT CHEST FINDINGS Cardiovascular: The heart size is normal. No pericardial effusion. No thoracic aortic aneurysm. Mediastinum/Nodes: No mediastinal lymphadenopathy. There is no hilar lymphadenopathy. There is no axillary lymphadenopathy. The esophagus has normal imaging features. Lungs/Pleura: Biapical pleural-parenchymal scarring evident. Atelectasis noted in the dependent lung bases. 7 mm left lower lobe pulmonary nodule seen on image 89. Several tiny subpleural nodules are seen in the extreme right lung base (image 115 series 4). Musculoskeletal: Bone windows reveal no worrisome lytic or sclerotic osseous lesions. CT ABDOMEN PELVIS FINDINGS Hepatobiliary: No focal abnormality within the liver parenchyma. There is  no evidence for gallstones, gallbladder wall thickening, or pericholecystic fluid. No intrahepatic or extrahepatic biliary dilation. Pancreas: No focal mass lesion. No dilatation of the main duct. No intraparenchymal cyst. No peripancreatic edema. Spleen: No splenomegaly. No focal mass lesion. Adrenals/Urinary Tract: No adrenal nodule or mass. 5 mm low-density cortical lesion interpolar right kidney likely a cyst.  Left kidney unremarkable. Ureters are unremarkable. Bladder is displaced by the large central pelvic mass described below. Stomach/Bowel: Stomach is nondistended. No gastric wall thickening. No evidence of outlet obstruction. Duodenum is normally positioned as is the ligament of Treitz. No small bowel wall thickening. No small bowel dilatation. The terminal ileum is normal. The appendix is not visualized, but there is no edema or inflammation in the region of the cecum. Colon is unremarkable except for the mid sigmoid segment that is draped across the cranial aspect of the large central pelvic mass. In this region there is focal wall thickening and imaging features are suspicious for invasion of the colonic wall in the mid sigmoid colon (see images 43 and 44 of coronal series 6 which correspond to image 96 and 97 of axial series 2). Vascular/Lymphatic: No abdominal aortic aneurysm. Bulky necrotic retroperitoneal lymphadenopathy is evident. 5.3 x 4.4 cm right para-aortic nodal mass markedly displaces the IVC posteriorly, generating prominent mass-effect on the vessel. Left para-aortic nodal conglomeration just caudal to the left renal vein measures 4.7 x 3.7 cm. 4.3 x 4.8 cm necrotic right common iliac lymph node is evident. Numerous other smaller retroperitoneal lymph nodes are evident. There is a necrotic nodal mass in the left pelvic sidewall measuring 4.8 x 2.6 cm on image 103 of series 2. The right external iliac vein is markedly attenuated along the right pelvic sidewall and patency of this vessel cannot be confirmed by CT. Similar marked attenuation of the left common iliac vein noted due to mass-effect from the central pelvic mass and the left pelvic sidewall lymphadenopathy. 2.2 cm necrotic left internal iliac lymph node is visualized on image 93. Reproductive: 9.6 x 11.2 x 12.6 cm heterogeneous cystic and solid or solid necrotic mass identified in the central pelvis. Axial imaging suggests at the left  gonadal vessels track into the left lateral aspect of this lesion raising the question of left ovarian origin. The right gonadal veins are less clearly associated with this lesion although the lesion does appear more bulky in the right anatomic pelvis. Difficult to assess on sagittal reformations, but while this central pelvic mass generates substantial mass-effect on the uterus, some images suggest that it is separate from the uterus. Other: Small amount of free fluid is identified in the pelvis. Musculoskeletal: Bone windows reveal no worrisome lytic or sclerotic osseous lesions. IMPRESSION: 1. Large heterogeneous central pelvic mass measures up to 12.6 cm. The lesion generates substantial mass-effect on the uterus, bladder, and pelvic sidewall anatomy. The mass appears to invade the mid sigmoid colon. Etiology of the central pelvic mass is not definitive by CT, but ovarian primary is suspected. The lesion becomes indistinguishable from the posterior uterus on some images and uterine origin is possible, but the uterus does not appear to be the epicenter of the mass and appears to be more displaced by it. MRI of the pelvis without and with contrast may prove helpful to better delineate the relationship of the uterus to the central pelvic mass although it may not be able to definitively localize the origin. 2. Bulky retroperitoneal and left pelvic sidewall lymphadenopathy. The retroperitoneal lymphadenopathy generates substantial mass-effect on  the IVC and left renal vein. The external iliac veins along each pelvic sidewall are markedly attenuated by the mass/lymphadenopathy. Patency of these vessels cannot be definitely confirmed on this exam. Electronically Signed   By: Misty Stanley M.D.   On: 11/13/2016 18:56   Ct Abdomen Pelvis W Contrast  Result Date: 11/13/2016 CLINICAL DATA:  Right side abdominal pain for 3 weeks. Intraabdominal abnormality seen on noncontrast CT earlier today. EXAM: CT CHEST, ABDOMEN,  AND PELVIS WITH CONTRAST TECHNIQUE: Multidetector CT imaging of the chest, abdomen and pelvis was performed following the standard protocol during bolus administration of intravenous contrast. CONTRAST:  75mL ISOVUE-300 IOPAMIDOL (ISOVUE-300) INJECTION 61% COMPARISON:  Noncontrast abdomen and pelvis CT from earlier today. FINDINGS: CT CHEST FINDINGS Cardiovascular: The heart size is normal. No pericardial effusion. No thoracic aortic aneurysm. Mediastinum/Nodes: No mediastinal lymphadenopathy. There is no hilar lymphadenopathy. There is no axillary lymphadenopathy. The esophagus has normal imaging features. Lungs/Pleura: Biapical pleural-parenchymal scarring evident. Atelectasis noted in the dependent lung bases. 7 mm left lower lobe pulmonary nodule seen on image 89. Several tiny subpleural nodules are seen in the extreme right lung base (image 115 series 4). Musculoskeletal: Bone windows reveal no worrisome lytic or sclerotic osseous lesions. CT ABDOMEN PELVIS FINDINGS Hepatobiliary: No focal abnormality within the liver parenchyma. There is no evidence for gallstones, gallbladder wall thickening, or pericholecystic fluid. No intrahepatic or extrahepatic biliary dilation. Pancreas: No focal mass lesion. No dilatation of the main duct. No intraparenchymal cyst. No peripancreatic edema. Spleen: No splenomegaly. No focal mass lesion. Adrenals/Urinary Tract: No adrenal nodule or mass. 5 mm low-density cortical lesion interpolar right kidney likely a cyst. Left kidney unremarkable. Ureters are unremarkable. Bladder is displaced by the large central pelvic mass described below. Stomach/Bowel: Stomach is nondistended. No gastric wall thickening. No evidence of outlet obstruction. Duodenum is normally positioned as is the ligament of Treitz. No small bowel wall thickening. No small bowel dilatation. The terminal ileum is normal. The appendix is not visualized, but there is no edema or inflammation in the region of the  cecum. Colon is unremarkable except for the mid sigmoid segment that is draped across the cranial aspect of the large central pelvic mass. In this region there is focal wall thickening and imaging features are suspicious for invasion of the colonic wall in the mid sigmoid colon (see images 43 and 44 of coronal series 6 which correspond to image 96 and 97 of axial series 2). Vascular/Lymphatic: No abdominal aortic aneurysm. Bulky necrotic retroperitoneal lymphadenopathy is evident. 5.3 x 4.4 cm right para-aortic nodal mass markedly displaces the IVC posteriorly, generating prominent mass-effect on the vessel. Left para-aortic nodal conglomeration just caudal to the left renal vein measures 4.7 x 3.7 cm. 4.3 x 4.8 cm necrotic right common iliac lymph node is evident. Numerous other smaller retroperitoneal lymph nodes are evident. There is a necrotic nodal mass in the left pelvic sidewall measuring 4.8 x 2.6 cm on image 103 of series 2. The right external iliac vein is markedly attenuated along the right pelvic sidewall and patency of this vessel cannot be confirmed by CT. Similar marked attenuation of the left common iliac vein noted due to mass-effect from the central pelvic mass and the left pelvic sidewall lymphadenopathy. 2.2 cm necrotic left internal iliac lymph node is visualized on image 93. Reproductive: 9.6 x 11.2 x 12.6 cm heterogeneous cystic and solid or solid necrotic mass identified in the central pelvis. Axial imaging suggests at the left gonadal vessels track into the  left lateral aspect of this lesion raising the question of left ovarian origin. The right gonadal veins are less clearly associated with this lesion although the lesion does appear more bulky in the right anatomic pelvis. Difficult to assess on sagittal reformations, but while this central pelvic mass generates substantial mass-effect on the uterus, some images suggest that it is separate from the uterus. Other: Small amount of free fluid  is identified in the pelvis. Musculoskeletal: Bone windows reveal no worrisome lytic or sclerotic osseous lesions. IMPRESSION: 1. Large heterogeneous central pelvic mass measures up to 12.6 cm. The lesion generates substantial mass-effect on the uterus, bladder, and pelvic sidewall anatomy. The mass appears to invade the mid sigmoid colon. Etiology of the central pelvic mass is not definitive by CT, but ovarian primary is suspected. The lesion becomes indistinguishable from the posterior uterus on some images and uterine origin is possible, but the uterus does not appear to be the epicenter of the mass and appears to be more displaced by it. MRI of the pelvis without and with contrast may prove helpful to better delineate the relationship of the uterus to the central pelvic mass although it may not be able to definitively localize the origin. 2. Bulky retroperitoneal and left pelvic sidewall lymphadenopathy. The retroperitoneal lymphadenopathy generates substantial mass-effect on the IVC and left renal vein. The external iliac veins along each pelvic sidewall are markedly attenuated by the mass/lymphadenopathy. Patency of these vessels cannot be definitely confirmed on this exam. Electronically Signed   By: Misty Stanley M.D.   On: 11/13/2016 18:56   Ct Abdomen Pelvis W Contrast  Result Date: 11/13/2016 CLINICAL DATA:  66 year old female with right-sided intermittent pain for the past 3 weeks. Initial encounter. EXAM: CT ABDOMEN AND PELVIS WITH CONTRAST TECHNIQUE: Multidetector CT imaging of the abdomen and pelvis was performed using the standard protocol following bolus administration of intravenous contrast. CONTRAST:  75 cc Isovue 300. No IV contrast is visualized suggesting extravasation. Discuss with Jinny Blossom, CT technologist. There were no problems during injection. Patient in emergency room. Currently being discussed with Dr. Quentin Cornwall. Exam was ordered without oral contrast. COMPARISON:  None. FINDINGS: Lower  chest: Right lower lobe pleural based 4.9 mm nodule (series 5, image 7) basilar subsegmental atelectasis. Heart size within normal limits. Hepatobiliary: Taking into account limitation by non contrast imaging, no hepatic mass. No calcified gallstones. Pancreas: Taking into account limitation by non contrast imaging, no primary pancreatic mass with surrounding inflammation. Spleen: Taking into account limitation by non contrast imaging, no mass or enlargement. Adrenals/Urinary Tract: No renal or ureteral obstructing stone. There is slight fullness of the renal collecting system which may be caused by mass effect upon the distal ureters by pelvic mass. Taking into account limitation by non contrast imaging, no renal or adrenal mass. Bladder markedly distorted by pelvic mass displaced anteriorly an under filled. Stomach/Bowel: Pelvic mass compresses colon and portions of small bowel. Adenopathy compresses and and anterior displaces duodenum and pancreas. Vascular/Lymphatic: Marked bulky adenopathy retroperitoneal region greater on the right appears partially calcified and is compressing surrounding structures including bowel, pancreas, inferior vena cava and aorta. Transverse dimension of right-sided retroperitoneal adenopathy measuring up to 6 cm. Pelvic adenopathy greatest on the left with left external iliac adenopathy transverse dimension of 4.3 x 2.7 cm. Aorta is partially calcified. At points the aorta is inseparable from the retroperitoneal adenopathy. Reproductive: Large complex pelvic mass spans over 13 x 15 x 12.7 cm and is compressing adjacent bowel and urinary bladder. This  may arise from the uterus with primary adnexal mass a secondary consideration. In addition to the more solid component, there is a cystic component versus loculated fluid within the cul de sac measuring 5.2 x 6 x 5.3 cm. Other: No free intraperitoneal air or bowel containing hernia. Musculoskeletal: Curvature lumbar spine without osseous  destructive lesion. IMPRESSION: Large complex pelvic mass spans over 13 x 15 x 12.7 cm and is compressing adjacent bowel and urinary bladder. This may arise from the uterus with primary adnexal mass a secondary consideration. In addition to the more solid component, there is a cystic component versus loculated fluid within the cul de sac measuring 5.2 x 6 x 5.3 cm. Gynecological consultation may be considered. This mass would be best assessed by pelvic MR (may be difficult with pelvic sonogram given the size). Marked bulky adenopathy retroperitoneal region greater on the right appears partially calcified and is compressing surrounding structures including bowel, pancreas, inferior vena cava and aorta. Transverse dimension of right-sided retroperitoneal adenopathy measuring up to 6 cm. Pelvic adenopathy greatest on the left with left external iliac adenopathy transverse dimension of 4.3 x 2.7 cm. Mild fullness of the renal collecting systems may be secondary to mass-effect upon the distal ureters. 4.9 mm right base pleural based nodule. Patient received contrast although appears to have extravasated. This is currently being treated by Dr. Quentin Cornwall. If contrast-enhanced CT scan is repeated after a new IV is started and patient noted to have normal renal function study, full ingestion of oral contrast recommended. CT scan of the chest, abdomen and pelvis may be performed. These results were called by telephone at the time of interpretation on 11/13/2016 at 4:06 pm to Dr. Merlyn Lot , who verbally acknowledged these results. Electronically Signed   By: Genia Del M.D.   On: 11/13/2016 16:37    Labs:  CBC:  Recent Labs  11/13/16 1409  WBC 4.8  HGB 10.4*  HCT 30.5*  PLT 300    COAGS:  Recent Labs  11/16/16 0847  INR 1.07  APTT 31    BMP:  Recent Labs  11/13/16 1409  NA 132*  K 4.2  CL 100*  CO2 25  GLUCOSE 92  BUN 8  CALCIUM 10.0  CREATININE 0.50  GFRNONAA >60  GFRAA >60     LIVER FUNCTION TESTS:  Recent Labs  11/13/16 1409  BILITOT 0.7  AST 33  ALT 8*  ALKPHOS 84  PROT 8.3*  ALBUMIN 4.0    TUMOR MARKERS: No results for input(s): AFPTM, CEA, CA199, CHROMGRNA in the last 8760 hours.  Assessment and Plan:  Newly diagnosed extensive pelvic, abdominal and retroperitoneal adenopathy.  Imaging findings concerning for lymphoma versus metastases from an ovarian or other gyn primary.   1.) CT guided biopsy of left retroperitoneal mass with moderate sedation.  Risks, benefits and alternatives discussed at length and questions were answered.  Pt understands and desires to proceed.   Thank you for this interesting consult.  I greatly enjoyed meeting Katie Hill and look forward to participating in their care.  A copy of this report was sent to the requesting provider on this date.  Electronically Signed: Jacqulynn Cadet, MD 11/16/2016, 10:34 AM   I spent a total of  15 Minutes   in face to face in clinical consultation, greater than 50% of which was counseling/coordinating care for liver mass.

## 2016-11-16 NOTE — Procedures (Signed)
Interventional Radiology Procedure Note  Procedure: CT guided bx of left retroperitoneal mass.   Complications: None  Estimated Blood Loss: None  Recommendations:  - Bedrest x 2 hrs - DC home  Signed,  Criselda Peaches, MD

## 2016-11-16 NOTE — Discharge Instructions (Signed)
Needle Biopsy, Care After °Refer to this sheet in the next few weeks. These instructions provide you with information about caring for yourself after your procedure. Your health care provider may also give you more specific instructions. Your treatment has been planned according to current medical practices, but problems sometimes occur. Call your health care provider if you have any problems or questions after your procedure. °What can I expect after the procedure? °After your procedure, it is common to have soreness, bruising, or mild pain at the biopsy site. This should go away in a few days. °Follow these instructions at home: °· Rest as directed by your health care provider. °· Take medicines only as directed by your health care provider. °· There are many different ways to close and cover the biopsy site, including stitches (sutures), skin glue, and adhesive strips. Follow your health care provider's instructions about: °? Biopsy site care. °? Bandage (dressing) changes and removal. °? Biopsy site closure removal. °· Check your biopsy site every day for signs of infection. Watch for: °? Redness, swelling, or pain. °? Fluid, blood, or pus. °Contact a health care provider if: °· You have a fever. °· You have redness, swelling, or pain at the biopsy site that lasts longer than a few days. °· You have fluid, blood, or pus coming from the biopsy site. °· You feel nauseous. °· You vomit. °Get help right away if: °· You have shortness of breath. °· You have trouble breathing. °· You have chest pain. °· You feel dizzy or you faint. °· You have bleeding that does not stop with pressure or a bandage. °· You cough up blood. °· You have pain in your abdomen. °This information is not intended to replace advice given to you by your health care provider. Make sure you discuss any questions you have with your health care provider. °Document Released: 08/11/2014 Document Revised: 09/02/2015 Document Reviewed:  03/23/2014 °Elsevier Interactive Patient Education © 2018 [employer] ° °

## 2016-11-17 ENCOUNTER — Telehealth: Payer: Self-pay

## 2016-11-17 LAB — PAP LB AND HPV HIGH-RISK
HPV, high-risk: NEGATIVE
PAP SMEAR COMMENT: 0

## 2016-11-17 LAB — SURGICAL PATHOLOGY

## 2016-11-17 LAB — CA 125: Cancer Antigen (CA) 125: 4382 U/mL — ABNORMAL HIGH (ref 0.0–38.1)

## 2016-11-17 LAB — CEA: CEA: 0.7 ng/mL (ref 0.0–4.7)

## 2016-11-17 NOTE — Telephone Encounter (Signed)
  Oncology Nurse Navigator Documentation Dr. Theora Gianotti and Dr. Fransisca Connors notified of pathology results below: Surgical Pathology  CASE: [id number]  PATIENT: Katie Hill  Surgical Pathology Report      SPECIMEN SUBMITTED:  A. Retroperitoneal adenopathy, left   CLINICAL HISTORY:  Large pelvic mass and retroperitoneal adenopathy   PRE-OPERATIVE DIAGNOSIS:  Metastatic ovarian/uterine cancer vs lymphoma   POST-OPERATIVE DIAGNOSIS:  None provided.      DIAGNOSIS:  A. LYMPH NODE, LEFT RETROPERITONEAL; CT-GUIDED CORE BIOPSY:  - METASTATIC HIGH-GRADE SEROUS CARCINOMA.   Comment:  The diagnosis was called to Mariea Clonts on 11/17/16 at 4:13 PM.  Read-back was performed.    GROSS DESCRIPTION:  A. Labeled: Left retroperitoneal adenopathy  Tissue fragment(s): 3  Size: 0.1-0.7 cm in length by 0.1 cm in diameter  Description: 3 cores of pale tan soft tissue soft tissue. In a separate  saline container is a small amount of pale tan soft tissue measuring 0.3  x 0.1 x 0.1 cm, which is triaged for lymphoma workup. 2 touch preps are  made, and the specimen is HELD for possible flow cytometry studies.  After review of histologic sections, flow cytometry is NOT performed.   Entirely submitted in 2 cassette(s).      Final Diagnosis performed by Bryan Lemma, MD. Electronically signed  11/17/2016 4:16:50PM      Navigator Location: [facility] (11/17/16 1600)   )Navigator Encounter Type: Diagnostic Results (11/17/16 1600)                                                    Time Spent with Patient: 15 (11/17/16 1600)

## 2016-11-20 ENCOUNTER — Other Ambulatory Visit: Payer: Self-pay

## 2016-11-20 DIAGNOSIS — C569 Malignant neoplasm of unspecified ovary: Secondary | ICD-10-CM | POA: Insufficient documentation

## 2016-11-20 HISTORY — DX: Malignant neoplasm of unspecified ovary: C56.9

## 2016-11-21 ENCOUNTER — Telehealth: Payer: Self-pay

## 2016-11-21 NOTE — Telephone Encounter (Signed)
  Oncology Nurse Navigator Documentation Notified [name] of appointment with Dr. Tasia Catchings, 8/15 at 1500. Per Dr. Theora Gianotti we can cancel appointment with Dr. Fransisca Connors. Navigator Location: [facility] (11/21/16 1700)   )Navigator Encounter Type: Telephone (11/21/16 1700)                                                    Time Spent with Patient: 15 (11/21/16 1700)

## 2016-11-22 ENCOUNTER — Inpatient Hospital Stay

## 2016-11-22 ENCOUNTER — Inpatient Hospital Stay (HOSPITAL_BASED_OUTPATIENT_CLINIC_OR_DEPARTMENT_OTHER): Admitting: Oncology

## 2016-11-22 ENCOUNTER — Encounter: Payer: Self-pay | Admitting: Oncology

## 2016-11-22 VITALS — BP 118/73 | HR 80 | Temp 98.6°F | Resp 16 | Ht 68.0 in | Wt 115.0 lb

## 2016-11-22 DIAGNOSIS — M545 Low back pain, unspecified: Secondary | ICD-10-CM

## 2016-11-22 DIAGNOSIS — Z5111 Encounter for antineoplastic chemotherapy: Secondary | ICD-10-CM | POA: Diagnosis not present

## 2016-11-22 DIAGNOSIS — C569 Malignant neoplasm of unspecified ovary: Secondary | ICD-10-CM | POA: Diagnosis not present

## 2016-11-22 DIAGNOSIS — D649 Anemia, unspecified: Secondary | ICD-10-CM

## 2016-11-22 LAB — CBC WITH DIFFERENTIAL/PLATELET
BASOS ABS: 0.3 10*3/uL — AB (ref 0–0.1)
BASOS PCT: 4 %
EOS ABS: 0.1 10*3/uL (ref 0–0.7)
Eosinophils Relative: 2 %
HCT: 31.1 % — ABNORMAL LOW (ref 35.0–47.0)
HEMOGLOBIN: 10.6 g/dL — AB (ref 12.0–16.0)
Lymphocytes Relative: 11 %
Lymphs Abs: 0.6 10*3/uL — ABNORMAL LOW (ref 1.0–3.6)
MCH: 28.6 pg (ref 26.0–34.0)
MCHC: 34.1 g/dL (ref 32.0–36.0)
MCV: 84 fL (ref 80.0–100.0)
MONOS PCT: 9 %
Monocytes Absolute: 0.5 10*3/uL (ref 0.2–0.9)
NEUTROS PCT: 74 %
Neutro Abs: 4.2 10*3/uL (ref 1.4–6.5)
Platelets: 318 10*3/uL (ref 150–440)
RBC: 3.7 MIL/uL — ABNORMAL LOW (ref 3.80–5.20)
RDW: 15.7 % — AB (ref 11.5–14.5)
WBC: 5.7 10*3/uL (ref 3.6–11.0)

## 2016-11-22 LAB — FERRITIN: Ferritin: 299 ng/mL (ref 11–307)

## 2016-11-22 LAB — IRON AND TIBC
IRON: 35 ug/dL (ref 28–170)
SATURATION RATIOS: 12 % (ref 10.4–31.8)
TIBC: 300 ug/dL (ref 250–450)
UIBC: 265 ug/dL

## 2016-11-22 NOTE — Progress Notes (Signed)
START ON PATHWAY REGIMEN - [place]     A cycle is every 21 days:     Paclitaxel      Carboplatin   **Always confirm dose/schedule in your pharmacy ordering system**    Patient Characteristics: Newly Diagnosed, Neoadjuvant Therapy AJCC T Category: T3c AJCC N Category: N1b AJCC M Category: M0 Therapeutic Status: Newly Diagnosed, Neoadjuvant Therapy AJCC 8 Stage Grouping: IIIC Intent of Therapy: Curative Intent, Discussed with Patient

## 2016-11-22 NOTE — Progress Notes (Signed)
Katie Hill Initial consultation  Patient Care Team: Patient, [name], RN as Registered Nurse  CHIEF COMPLAINTS/PURPOSE OF CONSULTATION: Newly diagnosed ovarian Hill.  No history exists.    HISTORY OF PRESENTING ILLNESS: Katie Hill 66 y.o. female with in her usual health with recently diagnosed ovarian Hill who is referred by Dr. Theora Gianotti to me for evaluation and management of ovarian Hill. Patient reportsthat she has been in her  Usual health until about 1 month ago she started to feel right lower quadrant pain. She presented to emergency room for evaluation. CT imaging revealed a large pelvic mass and extensive adenopathy. 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 Patient reports healthy at baseline and has not follow-up with any doctor for her life. She is married, G0P0 She denies any fatigue, weight loss, chest pain, cough, shortness of breath, and lower extremity swelling. She noted some urinary symptoms with frequency of urination. She also mentioned that she intermittently pass some mucus in her stool.also some back pain which is new.  Review of Systems  Constitutional: Negative.   HENT:  Negative.   Eyes: Negative.   Respiratory: Negative.   Cardiovascular: Negative.   Gastrointestinal: Positive for abdominal pain.       Mucus in the stool  Endocrine: Negative.   Genitourinary: Positive for frequency.   Musculoskeletal: Positive for back pain.  Skin: Negative.   Neurological: Negative.   Hematological: Negative.   Psychiatric/Behavioral: Negative.     MEDICAL HISTORY: Past Medical History:  Diagnosis Date  . 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 Pelvic mass in female     SURGICAL HISTORY: Past Surgical History:  Procedure Laterality Date  . APPENDECTOMY      SOCIAL HISTORY: Social History   Social History   . Marital status: Married    Spouse name: N/A  . Number of children: N/A  . Years of education: N/A   Occupational History  . Not on file.   Social History Main Topics  . Smoking status: Never Smoker  . Smokeless tobacco: Never Used  . Alcohol use Yes     Comment: occasional   . Drug use: No  . Sexual activity: Yes    Birth control/ protection: Post-menopausal   Other Topics Concern  . Not on file   Social History Narrative  . No narrative on file    FAMILY HISTORY Family History  Problem Relation Age of Onset  . Throat Hill Cousin   . Throat Hill Cousin     ALLERGIES:  has No Known Allergies.  MEDICATIONS:  Current Outpatient Prescriptions  Medication Sig Dispense Refill  . ondansetron (ZOFRAN) 4 MG tablet Take 1 tablet (4 mg total) by mouth daily as needed for nausea or vomiting. 14 tablet 0  . traMADol (ULTRAM) 50 MG tablet Take 1 tablet (50 mg total) by mouth every 6 (six) hours as needed. 20 tablet 0   No current facility-administered medications for this visit.     PHYSICAL EXAMINATION:  ECOG PERFORMANCE STATUS: 1 - Symptomatic but completely ambulatory   Vitals:   11/22/16 1515  BP: 118/73  Pulse: 80  Resp: 16  Temp: 98.6 F (37 C)    Filed Weights   11/22/16 1515  Weight: 115 lb (52.2 kg)     Physical Exam GENERAL: No distress, well nourished.  SKIN:  No rashes or significant lesions  HEAD:  Normocephalic, No masses, lesions, tenderness or abnormalities  EYES: Conjunctiva are pink, non icteric ENT: External ears normal ,lips , buccal mucosa, and tongue normal and mucous membranes are moist  LYMPH: No palpable cervical and axillary lymphadenopathy  LUNGS: Clear to auscultation, no crackles or wheezes HEART: Regular rate & rhythm, no murmurs, no gallops, S1 normal and S2 normal  ABDOMEN: Abdomen soft,  normal bowel sounds, She has right lower quadrant tenderness. I can't feel a mass in her lower abdomen. MUSCULOSKELETAL: No CVA  tenderness and no tenderness on percussion of the back or rib cage.  EXTREMITIES: No edema, no skin discoloration or tenderness NEURO: Alert & oriented, no focal motor/sensory deficits.    LABORATORY DATA: I have personally reviewed the data as listed: CBC    Component Value Date/Time   WBC 5.7 11/22/2016 0606   RBC 3.70 (L) 11/22/2016 0606   HGB 10.6 (L) 11/22/2016 0606   HCT 31.1 (L) 11/22/2016 0606   PLT 318 11/22/2016 0606   MCV 84.0 11/22/2016 0606   MCH 28.6 11/22/2016 0606   MCHC 34.1 11/22/2016 0606   RDW 15.7 (H) 11/22/2016 0606   LYMPHSABS 0.6 (L) 11/22/2016 0606   MONOABS 0.5 11/22/2016 0606   EOSABS 0.1 11/22/2016 0606   BASOSABS 0.3 (H) 11/22/2016 0606  Results for [name], [name] (MRN [id number]) as of 11/22/2016 21:23  Ref. Range 11/16/2016 08:47  Hill Antigen (CA) 125 Latest Ref Range: 0.0 - 38.1 U/mL 4,382.0 (H)  CEA Latest Ref Range: 0.0 - 4.7 ng/mL 0.7    pathology 11/16/2016 Surgical Pathology  CASE: [id number]  PATIENT: Katie Hill  Surgical Pathology Report   SPECIMEN SUBMITTED:  A. Retroperitoneal adenopathy, left  DIAGNOSIS:  A. LYMPH NODE, LEFT RETROPERITONEAL; CT-GUIDED CORE BIOPSY:  - METASTATIC HIGH-GRADE SEROUS CARCINOMA.   Comment:   RADIOGRAPHIC STUDIES: I have personally reviewed the radiological images as listed and agree with the findings in the report CT chest abdomen pelvis with contrast 11/13/2016 IMPRESSION: 1. Large heterogeneous central pelvic mass measures up to 12.6 cm. The lesion generates substantial mass-effect on the uterus, bladder, and pelvic sidewall anatomy. The mass appears to invade the mid sigmoid colon. Etiology of the central pelvic mass is not definitive by CT, but ovarian primary is suspected. The lesion becomes indistinguishable from the posterior uterus on some images and uterine origin is possible, but the uterus does not appear to be the epicenter of the mass and appears to be more displaced  by it. MRI of the pelvis without and with contrast may prove helpful to better delineate the relationship of the uterus to the central pelvic mass although it may not be able to definitively localize the origin. 2. Bulky retroperitoneal and left pelvic sidewall lymphadenopathy. The retroperitoneal lymphadenopathy generates substantial mass-effect on the IVC and left renal vein. The external iliac veins along each pelvic sidewall are markedly attenuated by the mass/lymphadenopathy. Patency of these vessels cannot be definitely confirmed on this exam.   ASSESSMENT/PLAN Hill Staging High grade ovarian Hill [facility]) Staging form: Ovary, Fallopian Tube, and Primary Peritoneal Carcinoma, AJCC 8th Edition - Clinical stage from 11/22/2016: Stage IIIC (cT3c, cN1b, cM0) - Signed by Earlie Server, MD on 11/22/2016  1. 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 Normocytic anemia   3. Midline low back pain without sciatica, unspecified chronicity    Lab results, image study results, pathology results were discussed with patient and her husband in detail.patient has been seen by GYN oncology Dr. Theora Gianotti. I agree with  the plan of new adjuvant chemotherapy with [place] and Taxol for 3 cycles, reevaluation for possible cytoreductive debulking.  I explained to the patient the risks and benefits of chemotherapy including all but not limited to hair loss, mouth sore, nausea, vomiting, low blood counts, bleeding, and risk of life threatening infection and even death, secondary malignancy etc.  Risk of neuropathy is associated with Taxol .patient agrees to proceed chemotherapy. # Chemotherapy education; # refer for vascular surgeon for port placement. # bone scan  Orders Placed This Encounter  Procedures  . NM Bone Scan Whole Body    Standing Status:   Future    Standing Expiration Date:   11/22/2017    Order Specific Question:   If indicated for the ordered procedure, I authorize the administration of a  radiopharmaceutical per Radiology protocol    Answer:   Yes    Order Specific Question:   Preferred imaging location?    Answer:    [facility]    Order Specific Question:   Radiology Contrast Protocol - do NOT remove file path    Answer:   [email or web address]  . CBC with Differential/Platelet    Standing Status:   Future    Number of Occurrences:   1    Standing Expiration Date:   11/22/2017  . Iron and TIBC    Standing Status:   Future    Number of Occurrences:   1    Standing Expiration Date:   11/22/2017  . Ferritin    Standing Status:   Future    Number of Occurrences:   1    Standing Expiration Date:   11/22/2017  . Ambulatory referral to Vascular Surgery    Referral Priority:   Routine    Referral Type:   Surgical    Referral Reason:   Specialty Services Required    Referred to Provider:   Algernon Huxley, MD    Requested Specialty:   Vascular Surgery    Number of Visits Requested:   1   Follow-up visit for port placement and on the day of beginning of chemotherapy. All questions were answered. The patient knows to call the clinic with any problems, questions or concerns. Thank you for this kind referral and the opportunity to participate in the care of this patient. A copy of today's note is routed to referring provider Dr.Secord,    Earlie Server, MD  11/22/2016 5:26 PM

## 2016-11-23 NOTE — Patient Instructions (Signed)

## 2016-11-24 ENCOUNTER — Other Ambulatory Visit (INDEPENDENT_AMBULATORY_CARE_PROVIDER_SITE_OTHER): Payer: Self-pay

## 2016-11-26 MED ORDER — SODIUM CHLORIDE 0.9 % IR SOLN
Freq: Once | Status: AC
  Administered 2016-11-27: 11:00:00
  Filled 2016-11-26: qty 2

## 2016-11-27 ENCOUNTER — Encounter
Admission: AD | Disposition: A | Discharge status: Home / Self Care | Payer: Self-pay | Source: Ambulatory Visit | Attending: Vascular Surgery

## 2016-11-27 ENCOUNTER — Encounter: Payer: Self-pay | Admitting: *Deleted

## 2016-11-27 ENCOUNTER — Telehealth: Payer: Self-pay

## 2016-11-27 ENCOUNTER — Ambulatory Visit
Admission: AD | Admit: 2016-11-27 | Discharge: 2016-11-27 | Disposition: A | Discharge status: Home / Self Care | Source: Ambulatory Visit | Attending: Vascular Surgery | Admitting: Vascular Surgery

## 2016-11-27 DIAGNOSIS — M545 Low back pain: Secondary | ICD-10-CM | POA: Diagnosis not present

## 2016-11-27 DIAGNOSIS — C779 Secondary and unspecified malignant neoplasm of lymph node, unspecified: Secondary | ICD-10-CM | POA: Diagnosis not present

## 2016-11-27 DIAGNOSIS — Z808 Family history of malignant neoplasm of other organs or systems: Secondary | ICD-10-CM | POA: Diagnosis not present

## 2016-11-27 DIAGNOSIS — C569 Malignant neoplasm of unspecified ovary: Secondary | ICD-10-CM | POA: Diagnosis not present

## 2016-11-27 DIAGNOSIS — Z9889 Other specified postprocedural states: Secondary | ICD-10-CM | POA: Diagnosis not present

## 2016-11-27 DIAGNOSIS — D649 Anemia, unspecified: Secondary | ICD-10-CM | POA: Insufficient documentation

## 2016-11-27 HISTORY — PX: PORTA CATH INSERTION: CATH118285

## 2016-11-27 SURGERY — PORTA CATH INSERTION
Anesthesia: Moderate Sedation

## 2016-11-27 MED ORDER — HEPARIN (PORCINE) IN NACL 2-0.9 UNIT/ML-% IJ SOLN
INTRAMUSCULAR | Status: AC
  Filled 2016-11-27: qty 500

## 2016-11-27 MED ORDER — FENTANYL CITRATE (PF) 100 MCG/2ML IJ SOLN
INTRAMUSCULAR | Status: AC
  Filled 2016-11-27: qty 2

## 2016-11-27 MED ORDER — SODIUM CHLORIDE 0.9 % IV SOLN
INTRAVENOUS | Status: DC
  Administered 2016-11-27: 11:00:00 via INTRAVENOUS

## 2016-11-27 MED ORDER — HYDROMORPHONE HCL PF 10 MG/ML IJ SOLN: 1.0000 mg/h | INTRAMUSCULAR | Status: DC

## 2016-11-27 MED ORDER — MIDAZOLAM HCL 2 MG/2ML IJ SOLN
INTRAMUSCULAR | Status: DC | PRN
  Administered 2016-11-27: 2 mg via INTRAVENOUS

## 2016-11-27 MED ORDER — CEFAZOLIN SODIUM-DEXTROSE 1-4 GM/50ML-% IV SOLN
1.0000 g | Freq: Once | INTRAVENOUS | Status: AC
  Administered 2016-11-27: 1 g via INTRAVENOUS

## 2016-11-27 MED ORDER — MIDAZOLAM HCL 2 MG/2ML IJ SOLN
INTRAMUSCULAR | Status: AC
  Filled 2016-11-27: qty 4

## 2016-11-27 MED ORDER — FENTANYL CITRATE (PF) 100 MCG/2ML IJ SOLN
INTRAMUSCULAR | Status: DC | PRN
  Administered 2016-11-27 (×2): 50 ug via INTRAVENOUS

## 2016-11-27 MED ORDER — ONDANSETRON HCL 4 MG/2ML IJ SOLN: 4.0000 mg | Freq: Once | INTRAMUSCULAR | Status: DC

## 2016-11-27 MED ORDER — TRAMADOL HCL 50 MG PO TABS: 50.0000 mg | ORAL_TABLET | Freq: Four times a day (QID) | ORAL | 0 refills | Status: DC | PRN

## 2016-11-27 MED ORDER — LIDOCAINE-EPINEPHRINE (PF) 2 %-1:200000 IJ SOLN
INTRAMUSCULAR | Status: AC
  Filled 2016-11-27: qty 20

## 2016-11-27 SURGICAL SUPPLY — 12 items
DERMABOND ADVANCED (GAUZE/BANDAGES/DRESSINGS) ×2
DERMABOND ADVANCED .7 DNX12 (GAUZE/BANDAGES/DRESSINGS) ×1 IMPLANT
KIT PORT POWER 8FR ISP CVUE (Miscellaneous) ×3 IMPLANT
PACK ANGIOGRAPHY (CUSTOM PROCEDURE TRAY) ×3 IMPLANT
PAD GROUND ADULT SPLIT (MISCELLANEOUS) ×3 IMPLANT
PENCIL ELECTRO HAND CTR (MISCELLANEOUS) ×3 IMPLANT
SUT MNCRL AB 4-0 PS2 18 (SUTURE) ×3 IMPLANT
SUT PROLENE 0 CT 1 30 (SUTURE) ×3 IMPLANT
SUTURE VIC 3-0 (SUTURE) ×3 IMPLANT
TOWEL OR 17X26 4PK STRL BLUE (TOWEL DISPOSABLE) ×3 IMPLANT
TUBING CONNECTING 10 (TUBING) ×2 IMPLANT
TUBING CONNECTING 10' (TUBING) ×1

## 2016-11-27 NOTE — Telephone Encounter (Signed)
  Oncology Nurse Navigator Documentation Received call from special procedures where Katie Hill is having her port a cath placed today. She is requesting a refill on her Ultram. Navigator Location: [facility] (11/27/16 1100)   )Navigator Encounter Type: Telephone (11/27/16 1100)                                                    Time Spent with Patient: 15 (11/27/16 1100)

## 2016-11-27 NOTE — Telephone Encounter (Addendum)
Per Dr Ander Gaster to refill, #120.  Rx printed, signed and faxed to pharmacy

## 2016-11-27 NOTE — Addendum Note (Signed)
Addended by: Vernetta Honey on: 11/27/2016 11:37 AM   Modules accepted: Orders

## 2016-11-27 NOTE — Op Note (Signed)
      [facility]       Operative Note  Date: 11/27/2016  Preoperative diagnosis:  1. Ovarian cancer  Postoperative diagnosis:  Same as above  Procedures: #1. Ultrasound guidance for vascular access to the right internal jugular vein. #2. Fluoroscopic guidance for placement of catheter. #3. Placement of CT compatible Port-A-Cath, right internal jugular vein.  Surgeon: Leotis Pain, MD.   Anesthesia: Local with moderate conscious sedation for approximately 20 minutes using 2 mg of Versed and 100 mcg of Fentanyl  Fluoroscopy time: less than 1 minute  Contrast used: 0  Estimated blood loss: 5 cc  Indication for the procedure:  The patient is a 66 y.o.female with ovarian cancer.  The patient needs a Port-A-Cath for durable venous access, chemotherapy, lab draws, and CT scans. We are asked to place this. Risks and benefits were discussed and informed consent was obtained.  Description of procedure: The patient was brought to the vascular and interventional radiology suite.  Moderate conscious sedation was administered throughout the procedure during a face to face encounter with the patient with my supervision of the RN administering medicines and monitoring the patient's vital signs, pulse oximetry, telemetry and mental status throughout from the start of the procedure until the patient was taken to the recovery room. The right neck chest and shoulder were sterilely prepped and draped, and a sterile surgical field was created. Ultrasound was used to help visualize a patent right internal jugular vein. This was then accessed under direct ultrasound guidance without difficulty with the Seldinger needle and a permanent image was recorded. A J-wire was placed. After skin nick and dilatation, the peel-away sheath was then placed over the wire. I then anesthetized an area under the clavicle approximately 1-2 fingerbreadths. A transverse incision was created and an inferior pocket  was created with electrocautery and blunt dissection. The port was then brought onto the field, placed into the pocket and secured to the chest wall with 2 Prolene sutures. The catheter was connected to the port and tunneled from the subclavicular incision to the access site. Fluoroscopic guidance was then used to cut the catheter to an appropriate length. The catheter was then placed through the peel-away sheath and the peel-away sheath was removed. The catheter tip was parked in excellent location under fluorocoscopic guidance in the SVC just above the right atrium. The pocket was then irrigated with antibiotic impregnated saline and the wound was closed with a running 3-0 Vicryl and a 4-0 Monocryl. The access incision was closed with a single 4-0 Monocryl. The Huber needle was used to withdraw blood and flush the port with heparinized saline. Dermabond was then placed as a dressing. The patient tolerated the procedure well and was taken to the recovery room in stable condition.   Leotis Pain 11/27/2016 2:35 PM   This note was created with Dragon Medical transcription system. Any errors in dictation are purely unintentional.

## 2016-11-27 NOTE — Discharge Instructions (Signed)
Moderate Conscious Sedation, Adult, Care After °These instructions provide you with information about caring for yourself after your procedure. Your health care provider may also give you more specific instructions. Your treatment has been planned according to current medical practices, but problems sometimes occur. Call your health care provider if you have any problems or questions after your procedure. °What can I expect after the procedure? °After your procedure, it is common: °· To feel sleepy for several hours. °· To feel clumsy and have poor balance for several hours. °· To have poor judgment for several hours. °· To vomit if you eat too soon. ° °Follow these instructions at home: °For at least 24 hours after the procedure: ° °· Do not: °? Participate in activities where you could fall or become injured. °? Drive. °? Use heavy machinery. °? Drink alcohol. °? Take sleeping pills or medicines that cause drowsiness. °? Make important decisions or sign legal documents. °? Take care of children on your own. °· Rest. °Eating and drinking °· Follow the diet recommended by your health care provider. °· If you vomit: °? Drink water, juice, or soup when you can drink without vomiting. °? Make sure you have little or no nausea before eating solid foods. °General instructions °· Have a responsible adult stay with you until you are awake and alert. °· Take over-the-counter and prescription medicines only as told by your health care provider. °· If you smoke, do not smoke without supervision. °· Keep all follow-up visits as told by your health care provider. This is important. °Contact a health care provider if: °· You keep feeling nauseous or you keep vomiting. °· You feel light-headed. °· You develop a rash. °· You have a fever. °Get help right away if: °· You have trouble breathing. °This information is not intended to replace advice given to you by your health care provider. Make sure you discuss any questions you have  with your health care provider. °Document Released: 01/15/2013 Document Revised: 08/30/2015 Document Reviewed: 07/17/2015 °Elsevier Interactive Patient Education © 2018 [employer] °Implanted Port Insertion, Care After °This sheet gives you information about how to care for yourself after your procedure. Your health care provider may also give you more specific instructions. If you have problems or questions, contact your health care provider. °What can I expect after the procedure? °After your procedure, it is common to have: °· Discomfort at the port insertion site. °· Bruising on the skin over the port. This should improve over 3-4 days. ° °Follow these instructions at home: °Port care °· After your port is placed, you will get a manufacturer's information card. The card has information about your port. Keep this card with you at all times. °· Take care of the port as told by your health care provider. Ask your health care provider if you or a family member can get training for taking care of the port at home. A home health care nurse may also take care of the port. °· Make sure to remember what type of port you have. °Incision care °· Follow instructions from your health care provider about how to take care of your port insertion site. Make sure you: °? Wash your hands with soap and water before you change your bandage (dressing). If soap and water are not available, use hand sanitizer. °? Change your dressing as told by your health care provider. °? Leave stitches (sutures), skin glue, or adhesive strips in place. These skin closures may need to stay   in place for 2 weeks or longer. If adhesive strip edges start to loosen and curl up, you may trim the loose edges. Do not remove adhesive strips completely unless your health care provider tells you to do that. °· Check your port insertion site every day for signs of infection. Check for: °? More redness, swelling, or pain. °? More fluid or  blood. °? Warmth. °? Pus or a bad smell. °General instructions °· Do not take baths, swim, or use a hot tub until your health care provider approves. °· Do not lift anything that is heavier than 10 lb (4.5 kg) for a week, or as told by your health care provider. °· Ask your health care provider when it is okay to: °? Return to work or school. °? Resume usual physical activities or sports. °· Do not drive for 24 hours if you were given a medicine to help you relax (sedative). °· Take over-the-counter and prescription medicines only as told by your health care provider. °· Wear a medical alert bracelet in case of an emergency. This will tell any health care providers that you have a port. °· Keep all follow-up visits as told by your health care provider. This is important. °Contact a health care provider if: °· You cannot flush your port with saline as directed, or you cannot draw blood from the port. °· You have a fever or chills. °· You have more redness, swelling, or pain around your port insertion site. °· You have more fluid or blood coming from your port insertion site. °· Your port insertion site feels warm to the touch. °· You have pus or a bad smell coming from the port insertion site. °Get help right away if: °· You have chest pain or shortness of breath. °· You have bleeding from your port that you cannot control. °Summary °· Take care of the port as told by your health care provider. °· Change your dressing as told by your health care provider. °· Keep all follow-up visits as told by your health care provider. °This information is not intended to replace advice given to you by your health care provider. Make sure you discuss any questions you have with your health care provider. °Document Released: 01/15/2013 Document Revised: 02/16/2016 Document Reviewed: 02/16/2016 °Elsevier Interactive Patient Education © 2017 Elsevier Inc. ° °

## 2016-11-27 NOTE — H&P (Signed)
[facility] History & Physical Update  The patient was interviewed and re-examined.  The patient's previous History and Physical has been reviewed and is unchanged.  There is no change in the plan of care. We plan to proceed with the scheduled procedure.  Leotis Pain, MD  11/27/2016, 10:12 AM

## 2016-11-28 ENCOUNTER — Encounter: Payer: Self-pay | Admitting: Vascular Surgery

## 2016-11-28 ENCOUNTER — Inpatient Hospital Stay

## 2016-11-29 ENCOUNTER — Telehealth: Payer: Self-pay | Admitting: *Deleted

## 2016-11-29 MED ORDER — LIDOCAINE-PRILOCAINE 2.5-2.5 % EX CREA: 1.0000 "application " | TOPICAL_CREAM | CUTANEOUS | 1 refills | Status: DC | PRN

## 2016-11-29 NOTE — Telephone Encounter (Signed)
Needs EMLA cream before treatment starts. E Scribed

## 2016-11-30 ENCOUNTER — Other Ambulatory Visit: Payer: Self-pay | Admitting: *Deleted

## 2016-11-30 DIAGNOSIS — C569 Malignant neoplasm of unspecified ovary: Secondary | ICD-10-CM

## 2016-12-01 ENCOUNTER — Inpatient Hospital Stay (HOSPITAL_BASED_OUTPATIENT_CLINIC_OR_DEPARTMENT_OTHER): Admitting: Oncology

## 2016-12-01 ENCOUNTER — Inpatient Hospital Stay

## 2016-12-01 ENCOUNTER — Encounter: Payer: Self-pay | Admitting: Oncology

## 2016-12-01 ENCOUNTER — Telehealth: Payer: Self-pay | Admitting: Oncology

## 2016-12-01 VITALS — BP 111/68 | HR 76 | Temp 98.2°F | Resp 16 | Ht 68.0 in | Wt 115.9 lb

## 2016-12-01 VITALS — BP 116/67 | HR 70 | Resp 20

## 2016-12-01 DIAGNOSIS — C569 Malignant neoplasm of unspecified ovary: Secondary | ICD-10-CM

## 2016-12-01 DIAGNOSIS — G893 Neoplasm related pain (acute) (chronic): Secondary | ICD-10-CM

## 2016-12-01 DIAGNOSIS — M545 Low back pain: Secondary | ICD-10-CM | POA: Diagnosis not present

## 2016-12-01 DIAGNOSIS — E871 Hypo-osmolality and hyponatremia: Secondary | ICD-10-CM | POA: Diagnosis not present

## 2016-12-01 DIAGNOSIS — D649 Anemia, unspecified: Secondary | ICD-10-CM

## 2016-12-01 DIAGNOSIS — Z79899 Other long term (current) drug therapy: Secondary | ICD-10-CM

## 2016-12-01 DIAGNOSIS — Z5111 Encounter for antineoplastic chemotherapy: Secondary | ICD-10-CM | POA: Diagnosis not present

## 2016-12-01 LAB — COMPREHENSIVE METABOLIC PANEL
ALBUMIN: 3.7 g/dL (ref 3.5–5.0)
ALT: 8 U/L — ABNORMAL LOW (ref 14–54)
ANION GAP: 6 (ref 5–15)
AST: 36 U/L (ref 15–41)
Alkaline Phosphatase: 81 U/L (ref 38–126)
BILIRUBIN TOTAL: 0.6 mg/dL (ref 0.3–1.2)
BUN: 8 mg/dL (ref 6–20)
CHLORIDE: 98 mmol/L — AB (ref 101–111)
CO2: 25 mmol/L (ref 22–32)
Calcium: 9.6 mg/dL (ref 8.9–10.3)
Creatinine, Ser: 0.51 mg/dL (ref 0.44–1.00)
GFR calc Af Amer: >60 mL/min (ref >60)
GFR calc non Af Amer: >60 mL/min (ref >60)
GLUCOSE: 95 mg/dL (ref 65–99)
POTASSIUM: 3.8 mmol/L (ref 3.5–5.1)
Sodium: 129 mmol/L — ABNORMAL LOW (ref 135–145)
TOTAL PROTEIN: 7.8 g/dL (ref 6.5–8.1)

## 2016-12-01 LAB — CBC WITH DIFFERENTIAL/PLATELET
BASOS ABS: 0 10*3/uL (ref 0–0.1)
BASOS PCT: 1 %
EOS ABS: 0.1 10*3/uL (ref 0–0.7)
EOS PCT: 1 %
HEMATOCRIT: 28 % — AB (ref 35.0–47.0)
Hemoglobin: 9.7 g/dL — ABNORMAL LOW (ref 12.0–16.0)
Lymphocytes Relative: 13 %
Lymphs Abs: 0.6 10*3/uL — ABNORMAL LOW (ref 1.0–3.6)
MCH: 29 pg (ref 26.0–34.0)
MCHC: 34.7 g/dL (ref 32.0–36.0)
MCV: 83.5 fL (ref 80.0–100.0)
MONO ABS: 0.5 10*3/uL (ref 0.2–0.9)
MONOS PCT: 10 %
Neutro Abs: 3.8 10*3/uL (ref 1.4–6.5)
Neutrophils Relative %: 75 %
PLATELETS: 273 10*3/uL (ref 150–440)
RBC: 3.35 MIL/uL — ABNORMAL LOW (ref 3.80–5.20)
RDW: 16 % — AB (ref 11.5–14.5)
WBC: 5 10*3/uL (ref 3.6–11.0)

## 2016-12-01 MED ORDER — SODIUM CHLORIDE 0.9 % IV SOLN
417.5000 mg | Freq: Once | INTRAVENOUS | Status: AC
  Administered 2016-12-01: 420 mg via INTRAVENOUS
  Filled 2016-12-01: qty 42

## 2016-12-01 MED ORDER — PALONOSETRON HCL INJECTION 0.25 MG/5ML
0.2500 mg | Freq: Once | INTRAVENOUS | Status: AC
  Administered 2016-12-01: 0.25 mg via INTRAVENOUS
  Filled 2016-12-01: qty 5

## 2016-12-01 MED ORDER — FAMOTIDINE IN NACL 20-0.9 MG/50ML-% IV SOLN
20.0000 mg | Freq: Once | INTRAVENOUS | Status: AC
  Administered 2016-12-01: 20 mg via INTRAVENOUS
  Filled 2016-12-01: qty 50

## 2016-12-01 MED ORDER — SODIUM CHLORIDE 0.9 % IV SOLN
Freq: Once | INTRAVENOUS | Status: AC
  Administered 2016-12-01: 10:00:00 via INTRAVENOUS
  Filled 2016-12-01: qty 1000

## 2016-12-01 MED ORDER — SODIUM CHLORIDE 0.9 % IV SOLN
20.0000 mg | Freq: Once | INTRAVENOUS | Status: AC
  Administered 2016-12-01: 20 mg via INTRAVENOUS
  Filled 2016-12-01: qty 2

## 2016-12-01 MED ORDER — HEPARIN SOD (PORK) LOCK FLUSH 100 UNIT/ML IV SOLN
500.0000 [IU] | Freq: Once | INTRAVENOUS | Status: AC | PRN
  Administered 2016-12-01: 500 [IU]

## 2016-12-01 MED ORDER — SODIUM CHLORIDE 0.9 % IV SOLN
Freq: Once | INTRAVENOUS | Status: AC
  Administered 2016-12-01: 09:00:00 via INTRAVENOUS
  Filled 2016-12-01: qty 1000

## 2016-12-01 MED ORDER — DIPHENHYDRAMINE HCL 50 MG/ML IJ SOLN
50.0000 mg | Freq: Once | INTRAMUSCULAR | Status: AC
  Administered 2016-12-01: 50 mg via INTRAVENOUS
  Filled 2016-12-01: qty 1

## 2016-12-01 MED ORDER — PACLITAXEL CHEMO INJECTION 300 MG/50ML
175.0000 mg/m2 | Freq: Once | INTRAVENOUS | Status: AC
  Administered 2016-12-01: 276 mg via INTRAVENOUS
  Filled 2016-12-01: qty 46

## 2016-12-01 NOTE — Progress Notes (Signed)
[facility] Initial consultation  Patient Care Team: Patient, No Pcp Per as PCP - General (General Practice) Katie Jacks, RN as Registered Nurse  CHIEF COMPLAINTS/PURPOSE OF CONSULTATION: Newly diagnosed ovarian cancer.  HISTORY OF PRESENTING ILLNESS: Katie Hill 66 y.o. female with in her usual health with recently diagnosed ovarian cancer who is referred by Dr. Theora Hill to me for evaluation and management of ovarian cancer. Patient reportsthat she has been in her  Usual health until about 1 month ago she started to feel right lower quadrant pain. She presented to emergency room for evaluation. CT imaging revealed a large pelvic mass and extensive adenopathy. 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 Patient reports healthy at baseline and has not follow-up with any doctor for her life. She is married, G0P0 She denies any fatigue, weight loss, chest pain, cough, shortness of breath, and lower extremity swelling. She noted some urinary symptoms with frequency of urination. She also mentioned that she intermittently pass some mucus in her stool.also some back pain which is new.  INTERVAL HISTORY Patient presents for evaluation prior to cycle 1, carbo and Taxol treatment. She takes 1 or 2 tramadol a day and her lower abdomen pain is well controlled. She continues to have increased urinary frequency. Denies fever chills, chest pain, shortness of breath,.   Review of Systems  Constitutional: Negative.   HENT:  Negative.   Eyes: Negative.   Respiratory: Negative.   Cardiovascular: Negative.   Gastrointestinal: Positive for abdominal pain.       Mucus in the stool  Endocrine: Negative.   Genitourinary: Positive for frequency.   Musculoskeletal: Positive for back pain.  Skin: Negative.   Neurological: Negative.   Hematological: Negative.   Psychiatric/Behavioral: Negative.     MEDICAL  HISTORY: Past Medical History:  Diagnosis Date  . 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 Pelvic mass in female     SURGICAL HISTORY: Past Surgical History:  Procedure Laterality Date  . APPENDECTOMY    . PORTA CATH INSERTION N/A 11/27/2016   Procedure: Glori Luis Cath Insertion;  Surgeon: Katie Huxley, MD;  Location: [facility];  Service: Cardiovascular;  Laterality: N/A;    SOCIAL HISTORY: Social History   Social History  . Marital status: Married    Spouse name: N/A  . Number of children: N/A  . Years of education: N/A   Occupational History  . Not on file.   Social History Main Topics  . Smoking status: Never Smoker  . Smokeless tobacco: Never Used  . Alcohol use Yes     Comment: occasional   . Drug use: No  . Sexual activity: Yes    Birth control/ protection: Post-menopausal   Other Topics Concern  . Not on file   Social History Narrative  . No narrative on file    FAMILY HISTORY Family History  Problem Relation Age of Onset  . Throat cancer Cousin   . Throat cancer Cousin     ALLERGIES:  has No Known Allergies.  MEDICATIONS:  Current Outpatient Prescriptions  Medication Sig Dispense Refill  . aspirin EC 325 MG tablet Take 325 mg by mouth daily as needed for moderate pain.    [name] lidocaine-prilocaine (EMLA) cream Apply 1 application topically as needed. Apply small amount to port site at least 1 hour prior to it being accessed, cover with plastic wrap 30 g 1  . ondansetron (ZOFRAN) 4 MG tablet  Take 1 tablet (4 mg total) by mouth daily as needed for nausea or vomiting. 14 tablet 0  . Tetrahydrozoline HCl (REDNESS RELIEVER EYE DROPS OP) Apply 1 drop to eye daily as needed (red eye).    . traMADol (ULTRAM) 50 MG tablet Take 1 tablet (50 mg total) by mouth every 6 (six) hours as needed. 120 tablet 0   Current Facility-Administered Medications  Medication Dose Route Frequency Provider Last Rate Last Dose  . HYDROmorphone (DILAUDID) 50 mg in sodium  chloride 0.9 % 100 mL (0.5 mg/mL) infusion  1 mg/hr Intravenous Continuous Dew, Erskine Squibb, MD      . ondansetron (ZOFRAN) injection 4 mg  4 mg Intravenous Once Katie Huxley, MD        PHYSICAL EXAMINATION:  ECOG PERFORMANCE STATUS: 1 - Symptomatic but completely ambulatory   Vitals:   12/01/16 0838  BP: 111/68  Pulse: 76  Resp: 16  Temp: 98.2 F (36.8 C)    Filed Weights   12/01/16 0838  Weight: 115 lb 14.4 oz (52.6 kg)     Physical Exam GENERAL: No distress, well nourished.  SKIN:  No rashes or significant lesions  HEAD: Normocephalic, No masses, lesions, tenderness or abnormalities  EYES: Conjunctiva are pink, non icteric ENT: External ears normal ,lips , buccal mucosa, and tongue normal and mucous membranes are moist  LYMPH: No palpable cervical and axillary lymphadenopathy  LUNGS: Clear to auscultation, no crackles or wheezes HEART: Regular rate & rhythm, no murmurs, no gallops, S1 normal and S2 normal  ABDOMEN: Abdomen soft,  normal bowel sounds, She has right lower quadrant tenderness.  MUSCULOSKELETAL: No CVA tenderness and no tenderness on percussion of the back or rib cage.  EXTREMITIES: No edema, no skin discoloration or tenderness NEURO: Alert & oriented, no focal motor/sensory deficits.    LABORATORY DATA: I have personally reviewed the data as listed: CBC    Component Value Date/Time   WBC 5.7 11/22/2016 0606   RBC 3.70 (L) 11/22/2016 0606   HGB 10.6 (L) 11/22/2016 0606   HCT 31.1 (L) 11/22/2016 0606   PLT 318 11/22/2016 0606   MCV 84.0 11/22/2016 0606   MCH 28.6 11/22/2016 0606   MCHC 34.1 11/22/2016 0606   RDW 15.7 (H) 11/22/2016 0606   LYMPHSABS 0.6 (L) 11/22/2016 0606   MONOABS 0.5 11/22/2016 0606   EOSABS 0.1 11/22/2016 0606   BASOSABS 0.3 (H) 11/22/2016 0606  Results for Katie Hill (MRN [id number]) as of 11/22/2016 21:23  Ref. Range 11/16/2016 08:47  Cancer Antigen (CA) 125 Latest Ref Range: 0.0 - 38.1 U/mL 4,382.0 (H)  CEA Latest  Ref Range: 0.0 - 4.7 ng/mL 0.7  Results for Katie Hill (MRN [id number]) as of 12/01/2016 09:50  Ref. Range 11/22/2016 06:06  Iron Latest Ref Range: 28 - 170 ug/dL 35  UIBC Latest Units: ug/dL 265  TIBC Latest Ref Range: 250 - 450 ug/dL 300  Saturation Ratios Latest Ref Range: 10.4 - 31.8 % 12  Ferritin Latest Ref Range: 11 - 307 ng/mL 299    pathology 11/16/2016 Surgical Pathology  CASE: [id number]  PATIENT: Loella Nolde  Surgical Pathology Report   SPECIMEN SUBMITTED:  A. Retroperitoneal adenopathy, left  DIAGNOSIS:  A. LYMPH NODE, LEFT RETROPERITONEAL; CT-GUIDED CORE BIOPSY:  - METASTATIC HIGH-GRADE SEROUS CARCINOMA.   Comment:   RADIOGRAPHIC STUDIES: I have personally reviewed the radiological images as listed and agree with the findings in the report CT chest abdomen pelvis with contrast 11/13/2016 IMPRESSION: 1.  Large heterogeneous central pelvic mass measures up to 12.6 cm. The lesion generates substantial mass-effect on the uterus, bladder, and pelvic sidewall anatomy. The mass appears to invade the mid sigmoid colon. Etiology of the central pelvic mass is not definitive by CT, but ovarian primary is suspected. The lesion becomes indistinguishable from the posterior uterus on some images and uterine origin is possible, but the uterus does not appear to be the epicenter of the mass and appears to be more displaced by it. MRI of the pelvis without and with contrast may prove helpful to better delineate the relationship of the uterus to the central pelvic mass although it may not be able to definitively localize the origin. 2. Bulky retroperitoneal and left pelvic sidewall lymphadenopathy. The retroperitoneal lymphadenopathy generates substantial mass-effect on the IVC and left renal vein. The external iliac veins along each pelvic sidewall are markedly attenuated by the mass/lymphadenopathy. Patency of these vessels cannot be definitely confirmed on this  exam.   ASSESSMENT/PLAN Cancer Staging High grade ovarian cancer [facility]) Staging form: Ovary, Fallopian Tube, and Primary Peritoneal Carcinoma, AJCC 8th Edition - Clinical stage from 11/22/2016: Stage IIIC (cT3c, cN1b, cM0) - Signed by Earlie Server, MD on 11/22/2016  1. High grade ovarian cancer ([place])   2. Normocytic anemia   3. Encounter for antineoplastic chemotherapy   4. Neoplasm related pain   5. Hyponatremia    # Ok to proceed cycle 1 carbo and taxol.  # Hyponatremia is likely due to poor oral intake and dehydration. add 1 L NS for hydration.  # Continue tramadol 50 mg every 6 hours when necessary. # Patient has antiemetics medication.  # bone scan pending  Follow-up 1 week prior to day 8 of cycle 1 to access tolerability.   All questions were answered. The patient knows to call the clinic with any problems, questions or concerns.  Orders Placed This Encounter  Procedures  . CBC with Differential/Platelet    Standing Status:   Standing    Number of Occurrences:   20    Standing Expiration Date:   12/01/2017  . Comprehensive metabolic panel    Standing Status:   Standing    Number of Occurrences:   20    Standing Expiration Date:   12/01/2017    Earlie Server, MD  12/01/2016 8:22 AM

## 2016-12-01 NOTE — Progress Notes (Signed)
Patient here for initial treatment. No changes since last apt. Patient still experiencing lower abdominal pain.

## 2016-12-01 NOTE — Telephone Encounter (Signed)
Per Julie/Check out notes, schd Lab/MD/Taxol/Carbo in 1 week. Treatment plan reflected Taxol/Carbo q 3 weeks. Today is the patient's 1 day of Chemo, meaning that the patient should be returning 1 week after 1st tx for TOXICITY check.  Verified with Dr Tasia Catchings, Taxol/Carbo should be q 3 weeks as reflected in tx plan, with Tox chk in 1 week. Advised Dr Tasia Catchings to update/addend orders/check out notes. Patient scheduled for Lab/MD and TOXICITY check in 1 week.

## 2016-12-02 ENCOUNTER — Encounter
Admission: RE | Admit: 2016-12-02 | Discharge: 2016-12-02 | Disposition: A | Discharge status: Home / Self Care | Source: Ambulatory Visit | Attending: Oncology | Admitting: Oncology

## 2016-12-02 ENCOUNTER — Ambulatory Visit
Admission: RE | Admit: 2016-12-02 | Discharge: 2016-12-02 | Disposition: A | Discharge status: Home / Self Care | Source: Ambulatory Visit | Attending: Oncology | Admitting: Oncology

## 2016-12-02 DIAGNOSIS — C569 Malignant neoplasm of unspecified ovary: Secondary | ICD-10-CM | POA: Diagnosis not present

## 2016-12-02 MED ORDER — TECHNETIUM TC 99M MEDRONATE IV KIT
25.0000 | PACK | Freq: Once | INTRAVENOUS | Status: AC | PRN
  Administered 2016-12-02: 21.66 via INTRAVENOUS

## 2016-12-08 ENCOUNTER — Encounter: Payer: Self-pay | Admitting: Oncology

## 2016-12-08 ENCOUNTER — Inpatient Hospital Stay

## 2016-12-08 ENCOUNTER — Other Ambulatory Visit: Payer: Self-pay

## 2016-12-08 ENCOUNTER — Inpatient Hospital Stay (HOSPITAL_BASED_OUTPATIENT_CLINIC_OR_DEPARTMENT_OTHER): Admitting: Oncology

## 2016-12-08 VITALS — BP 105/69 | HR 81 | Temp 97.8°F | Wt 115.1 lb

## 2016-12-08 DIAGNOSIS — E860 Dehydration: Secondary | ICD-10-CM

## 2016-12-08 DIAGNOSIS — M545 Low back pain: Secondary | ICD-10-CM

## 2016-12-08 DIAGNOSIS — C569 Malignant neoplasm of unspecified ovary: Secondary | ICD-10-CM

## 2016-12-08 DIAGNOSIS — D649 Anemia, unspecified: Secondary | ICD-10-CM

## 2016-12-08 DIAGNOSIS — C678 Malignant neoplasm of overlapping sites of bladder: Secondary | ICD-10-CM

## 2016-12-08 DIAGNOSIS — R1900 Intra-abdominal and pelvic swelling, mass and lump, unspecified site: Secondary | ICD-10-CM

## 2016-12-08 DIAGNOSIS — E871 Hypo-osmolality and hyponatremia: Secondary | ICD-10-CM | POA: Diagnosis not present

## 2016-12-08 DIAGNOSIS — Z79899 Other long term (current) drug therapy: Secondary | ICD-10-CM

## 2016-12-08 DIAGNOSIS — Z5111 Encounter for antineoplastic chemotherapy: Secondary | ICD-10-CM | POA: Diagnosis not present

## 2016-12-08 DIAGNOSIS — G893 Neoplasm related pain (acute) (chronic): Secondary | ICD-10-CM

## 2016-12-08 LAB — CBC WITH DIFFERENTIAL/PLATELET
BASOS ABS: 0 10*3/uL (ref 0–0.1)
BASOS PCT: 1 %
Eosinophils Absolute: 0.1 10*3/uL (ref 0–0.7)
Eosinophils Relative: 2 %
HCT: 28.1 % — ABNORMAL LOW (ref 35.0–47.0)
HEMOGLOBIN: 9.5 g/dL — AB (ref 12.0–16.0)
LYMPHS PCT: 16 %
Lymphs Abs: 0.6 10*3/uL — ABNORMAL LOW (ref 1.0–3.6)
MCH: 28.6 pg (ref 26.0–34.0)
MCHC: 34 g/dL (ref 32.0–36.0)
MCV: 84.1 fL (ref 80.0–100.0)
MONOS PCT: 6 %
Monocytes Absolute: 0.2 10*3/uL (ref 0.2–0.9)
NEUTROS ABS: 2.6 10*3/uL (ref 1.4–6.5)
NEUTROS PCT: 75 %
Platelets: 254 10*3/uL (ref 150–440)
RBC: 3.34 MIL/uL — ABNORMAL LOW (ref 3.80–5.20)
RDW: 16.1 % — ABNORMAL HIGH (ref 11.5–14.5)
WBC: 3.5 10*3/uL — ABNORMAL LOW (ref 3.6–11.0)

## 2016-12-08 LAB — COMPREHENSIVE METABOLIC PANEL
ALBUMIN: 3.7 g/dL (ref 3.5–5.0)
ALK PHOS: 80 U/L (ref 38–126)
ALT: 13 U/L — ABNORMAL LOW (ref 14–54)
AST: 39 U/L (ref 15–41)
Anion gap: 8 (ref 5–15)
BILIRUBIN TOTAL: 0.4 mg/dL (ref 0.3–1.2)
BUN: 8 mg/dL (ref 6–20)
CALCIUM: 9.5 mg/dL (ref 8.9–10.3)
CO2: 24 mmol/L (ref 22–32)
CREATININE: 0.46 mg/dL (ref 0.44–1.00)
Chloride: 92 mmol/L — ABNORMAL LOW (ref 101–111)
GFR calc Af Amer: >60 mL/min (ref >60)
GFR calc non Af Amer: >60 mL/min (ref >60)
Glucose, Bld: 112 mg/dL — ABNORMAL HIGH (ref 65–99)
Potassium: 3.9 mmol/L (ref 3.5–5.1)
Sodium: 124 mmol/L — ABNORMAL LOW (ref 135–145)
TOTAL PROTEIN: 7.5 g/dL (ref 6.5–8.1)

## 2016-12-08 LAB — OSMOLALITY: OSMOLALITY: 260 mosm/kg — AB (ref 275–295)

## 2016-12-08 MED ORDER — ONDANSETRON HCL 4 MG PO TABS: 4.0000 mg | ORAL_TABLET | Freq: Every day | ORAL | 0 refills | Status: DC | PRN

## 2016-12-08 MED ORDER — SODIUM CHLORIDE 0.9% FLUSH
10.0000 mL | Freq: Once | INTRAVENOUS | Status: AC
  Administered 2016-12-08: 10 mL via INTRAVENOUS
  Filled 2016-12-08: qty 10

## 2016-12-08 MED ORDER — HYDROCORTISONE 2.5 % EX CREA
TOPICAL_CREAM | Freq: Two times a day (BID) | CUTANEOUS | 0 refills | Status: DC
Start: 2016-12-08 — End: 2017-02-05

## 2016-12-08 MED ORDER — SODIUM CHLORIDE 0.9 % IV SOLN
Freq: Once | INTRAVENOUS | Status: AC
  Administered 2016-12-08: 15:00:00 via INTRAVENOUS
  Filled 2016-12-08: qty 1000

## 2016-12-08 MED ORDER — SENNA 8.6 MG PO TABS: 2.0000 | ORAL_TABLET | Freq: Every day | ORAL | 0 refills | Status: DC

## 2016-12-08 MED ORDER — OXYCODONE HCL 5 MG PO TABS: 5.0000 mg | ORAL_TABLET | ORAL | 0 refills | Status: DC | PRN

## 2016-12-08 MED ORDER — HEPARIN SOD (PORK) LOCK FLUSH 100 UNIT/ML IV SOLN
500.0000 [IU] | Freq: Once | INTRAVENOUS | Status: AC
  Administered 2016-12-08: 500 [IU] via INTRAVENOUS
  Filled 2016-12-08: qty 5

## 2016-12-08 NOTE — Progress Notes (Signed)
[facility] Initial consultation  Patient Care Team: Patient, [name], RN as Registered Nurse  CHIEF COMPLAINTS/PURPOSE OF CONSULTATION: Newly diagnosed ovarian cancer.  HISTORY OF PRESENTING ILLNESS: Katie Hill 66 y.o. female with in her usual health with recently diagnosed ovarian cancer who is referred by Dr. Theora Gianotti to me for evaluation and management of ovarian cancer. Patient reportsthat she has been in her  Usual health until about 1 month ago she started to feel right lower quadrant pain. She presented to emergency room for evaluation. CT imaging revealed a large pelvic mass and extensive adenopathy. 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 Patient reports healthy at baseline and has not follow-up with any doctor for her life. She is married, G0P0 She denies any fatigue, weight loss, chest pain, cough, shortness of breath, and lower extremity swelling. She noted some urinary symptoms with frequency of urination. She also mentioned that she intermittently pass some mucus in her stool.also some back pain which is new.  INTERVAL HISTORY Patient presents for evaluation on Day 8 of cycle 1, carbo and Taxol treatment. She reports feeling nausea and need zofran refll. Appetite is poor. She take tramadol for pain control and feels pain is not fully controlled. She continues to have increased urinary frequency. Denies fever chills, chest pain, shortness of breath,.   Review of Systems  Constitutional: Negative.   HENT:  Negative.   Eyes: Negative.   Respiratory: Negative.   Cardiovascular: Negative.   Gastrointestinal: Positive for abdominal pain.       Mucus in the stool  Endocrine: Negative.   Genitourinary: Positive for frequency.   Musculoskeletal: Positive for back pain.  Skin: Negative.   Neurological: Negative.   Hematological:  Negative.   Psychiatric/Behavioral: Negative.     MEDICAL HISTORY: Past Medical History:  Diagnosis Date  . 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 Pelvic mass in female     SURGICAL HISTORY: Past Surgical History:  Procedure Laterality Date  . APPENDECTOMY    . PORTA CATH INSERTION N/A 11/27/2016   Procedure: Glori Luis Cath Insertion;  Surgeon: Algernon Huxley, MD;  Location: [facility];  Service: Cardiovascular;  Laterality: N/A;    SOCIAL HISTORY: Social History   Social History  . Marital status: Married    Spouse name: N/A  . Number of children: N/A  . Years of education: N/A   Occupational History  . Not on file.   Social History Main Topics  . Smoking status: Never Smoker  . Smokeless tobacco: Never Used  . Alcohol use Yes     Comment: occasional   . Drug use: No  . Sexual activity: Yes    Birth control/ protection: Post-menopausal   Other Topics Concern  . Not on file   Social History Narrative  . No narrative on file    FAMILY HISTORY Family History  Problem Relation Age of Onset  . Throat cancer Cousin   . Throat cancer Cousin     ALLERGIES:  has No Known Allergies.  MEDICATIONS:  Current Outpatient Prescriptions  Medication Sig Dispense Refill  . aspirin EC 325 MG tablet Take 325 mg by mouth daily as needed for moderate pain.    [name] lidocaine-prilocaine (EMLA) cream Apply 1 application topically as needed. Apply small amount to port site at least 1 hour prior to it being accessed, cover with plastic wrap  30 g 1  . ondansetron (ZOFRAN) 4 MG tablet Take 1 tablet (4 mg total) by mouth daily as needed for nausea or vomiting. 14 tablet 0  . Tetrahydrozoline HCl (REDNESS RELIEVER EYE DROPS OP) Apply 1 drop to eye daily as needed (red eye).    . traMADol (ULTRAM) 50 MG tablet Take 1 tablet (50 mg total) by mouth every 6 (six) hours as needed. 120 tablet 0   Current Facility-Administered Medications  Medication Dose Route Frequency Provider  Last Rate Last Dose  . HYDROmorphone (DILAUDID) 50 mg in sodium chloride 0.9 % 100 mL (0.5 mg/mL) infusion  1 mg/hr Intravenous Continuous Dew, Erskine Squibb, MD      . ondansetron (ZOFRAN) injection 4 mg  4 mg Intravenous Once Algernon Huxley, MD        PHYSICAL EXAMINATION:  ECOG PERFORMANCE STATUS: 1 - Symptomatic but completely ambulatory   Vitals:   12/08/16 1400  BP: 105/69  Pulse: 81  Temp: 97.8 F (36.6 C)    Filed Weights   12/08/16 1400  Weight: 115 lb 2 oz (52.2 kg)     Physical Exam GENERAL: No distress, well nourished.  SKIN:  No rashes or significant lesions  HEAD: Normocephalic, No masses, lesions, tenderness or abnormalities  EYES: Conjunctiva are pink, non icteric ENT: External ears normal ,lips , buccal mucosa, and tongue normal and mucous membranes are moist  LYMPH: No palpable cervical and axillary lymphadenopathy  LUNGS: Clear to auscultation, no crackles or wheezes HEART: Regular rate & rhythm, no murmurs, no gallops, S1 normal and S2 normal  ABDOMEN: Abdomen soft,  normal bowel sounds, She has right lower quadrant tenderness.  MUSCULOSKELETAL: No CVA tenderness and no tenderness on percussion of the back or rib cage.  EXTREMITIES: No edema, no skin discoloration or tenderness NEURO: Alert & oriented, no focal motor/sensory deficits.    LABORATORY DATA: I have personally reviewed the data as listed: CBC    Component Value Date/Time   WBC 3.5 (L) 12/08/2016 1335   RBC 3.34 (L) 12/08/2016 1335   HGB 9.5 (L) 12/08/2016 1335   HCT 28.1 (L) 12/08/2016 1335   PLT 254 12/08/2016 1335   MCV 84.1 12/08/2016 1335   MCH 28.6 12/08/2016 1335   MCHC 34.0 12/08/2016 1335   RDW 16.1 (H) 12/08/2016 1335   LYMPHSABS 0.6 (L) 12/08/2016 1335   MONOABS 0.2 12/08/2016 1335   EOSABS 0.1 12/08/2016 1335   BASOSABS 0.0 12/08/2016 1335  Results for LOUINE, TENPENNY (MRN [id number]) as of 11/22/2016 21:23  Ref. Range 11/16/2016 08:47  Cancer Antigen (CA) 125 Latest  Ref Range: 0.0 - 38.1 U/mL 4,382.0 (H)  CEA Latest Ref Range: 0.0 - 4.7 ng/mL 0.7  Results for TYSHIKA, BALDRIDGE (MRN [id number]) as of 12/01/2016 09:50  Ref. Range 11/22/2016 06:06  Iron Latest Ref Range: 28 - 170 ug/dL 35  UIBC Latest Units: ug/dL 265  TIBC Latest Ref Range: 250 - 450 ug/dL 300  Saturation Ratios Latest Ref Range: 10.4 - 31.8 % 12  Ferritin Latest Ref Range: 11 - 307 ng/mL 299    pathology 11/16/2016 Surgical Pathology  CASE: [id number]  PATIENT: Pandora Samuelson  Surgical Pathology Report   SPECIMEN SUBMITTED:  A. Retroperitoneal adenopathy, left  DIAGNOSIS:  A. LYMPH NODE, LEFT RETROPERITONEAL; CT-GUIDED CORE BIOPSY:  - METASTATIC HIGH-GRADE SEROUS CARCINOMA.   Comment:   RADIOGRAPHIC STUDIES: I have personally reviewed the radiological images as listed and agree with the findings in the report CT chest  abdomen pelvis with contrast 11/13/2016 IMPRESSION: 1. Large heterogeneous central pelvic mass measures up to 12.6 cm. The lesion generates substantial mass-effect on the uterus, bladder, and pelvic sidewall anatomy. The mass appears to invade the mid sigmoid colon. Etiology of the central pelvic mass is not definitive by CT, but ovarian primary is suspected. The lesion becomes indistinguishable from the posterior uterus on some images and uterine origin is possible, but the uterus does not appear to be the epicenter of the mass and appears to be more displaced by it. MRI of the pelvis without and with contrast may prove helpful to better delineate the relationship of the uterus to the central pelvic mass although it may not be able to definitively localize the origin. 2. Bulky retroperitoneal and left pelvic sidewall lymphadenopathy. The retroperitoneal lymphadenopathy generates substantial mass-effect on the IVC and left renal vein. The external iliac veins along each pelvic sidewall are markedly attenuated by the mass/lymphadenopathy. Patency of these  vessels cannot be definitely confirmed on this exam.   ASSESSMENT/PLAN Cancer Staging High grade ovarian cancer [facility]) Staging form: Ovary, Fallopian Tube, and Primary Peritoneal Carcinoma, AJCC 8th Edition - Clinical stage from 11/22/2016: Stage IIIC (cT3c, cN1b, cM0) - Signed by Earlie Server, MD on 11/22/2016  1. High grade ovarian cancer ([place])   2. Pelvic mass    # Ok to proceed cycle 1 carbo and taxol.  # Hyponatremia is likely due to poor oral intake and dehydration.  IV 1 L NS for hydration. Chest plasma osmolarity # switch to oxycodone 5mg  Q4-6 hours as needed.  # Patient has antiemetics medication.  # bone scan negative for bone mets. Repeat BMP on 9/4 Follow-up on 12/25/2016 on Day 1 of cycle 2. All questions were answered. The patient knows to call the clinic with any problems, questions or concerns.  Orders Placed This Encounter  Procedures  . Osmolality    Standing Status:   Future    Number of Occurrences:   1    Standing Expiration Date:   12/08/2017  . Basic metabolic panel    Standing Status:   Future    Standing Expiration Date:   12/08/2017    Earlie Server, MD  12/08/2016 2:04 PM

## 2016-12-08 NOTE — Progress Notes (Signed)
Patient c/o constipation and needing increase of pain medication, tramadol works but isnt lasting to 4 hours.  Rash in the groin area that appeared about 3 days ago

## 2016-12-12 ENCOUNTER — Inpatient Hospital Stay: Attending: Oncology | Admitting: Oncology

## 2016-12-12 ENCOUNTER — Inpatient Hospital Stay

## 2016-12-12 ENCOUNTER — Encounter: Payer: Self-pay | Admitting: Oncology

## 2016-12-12 ENCOUNTER — Other Ambulatory Visit: Payer: Self-pay

## 2016-12-12 ENCOUNTER — Other Ambulatory Visit: Payer: Self-pay | Admitting: Oncology

## 2016-12-12 VITALS — BP 111/63 | HR 79 | Temp 98.2°F

## 2016-12-12 DIAGNOSIS — E871 Hypo-osmolality and hyponatremia: Secondary | ICD-10-CM

## 2016-12-12 DIAGNOSIS — C569 Malignant neoplasm of unspecified ovary: Secondary | ICD-10-CM

## 2016-12-12 DIAGNOSIS — R21 Rash and other nonspecific skin eruption: Secondary | ICD-10-CM | POA: Diagnosis not present

## 2016-12-12 DIAGNOSIS — R1900 Intra-abdominal and pelvic swelling, mass and lump, unspecified site: Secondary | ICD-10-CM

## 2016-12-12 DIAGNOSIS — Z79899 Other long term (current) drug therapy: Secondary | ICD-10-CM | POA: Insufficient documentation

## 2016-12-12 DIAGNOSIS — D649 Anemia, unspecified: Secondary | ICD-10-CM | POA: Insufficient documentation

## 2016-12-12 DIAGNOSIS — G893 Neoplasm related pain (acute) (chronic): Secondary | ICD-10-CM | POA: Diagnosis not present

## 2016-12-12 DIAGNOSIS — Z5111 Encounter for antineoplastic chemotherapy: Secondary | ICD-10-CM | POA: Insufficient documentation

## 2016-12-12 DIAGNOSIS — E860 Dehydration: Secondary | ICD-10-CM

## 2016-12-12 DIAGNOSIS — Z7982 Long term (current) use of aspirin: Secondary | ICD-10-CM | POA: Insufficient documentation

## 2016-12-12 LAB — BASIC METABOLIC PANEL WITH GFR
Anion gap: 7 (ref 5–15)
BUN: 10 mg/dL (ref 6–20)
CO2: 27 mmol/L (ref 22–32)
Calcium: 9.4 mg/dL (ref 8.9–10.3)
Chloride: 93 mmol/L — ABNORMAL LOW (ref 101–111)
Creatinine, Ser: 0.51 mg/dL (ref 0.44–1.00)
GFR calc Af Amer: >60 mL/min
GFR calc non Af Amer: >60 mL/min
Glucose, Bld: 115 mg/dL — ABNORMAL HIGH (ref 65–99)
Potassium: 3.5 mmol/L (ref 3.5–5.1)
Sodium: 127 mmol/L — ABNORMAL LOW (ref 135–145)

## 2016-12-12 LAB — OSMOLALITY, URINE: Osmolality, Ur: 242 mOsm/kg — ABNORMAL LOW (ref 300–900)

## 2016-12-12 MED ORDER — PREDNISONE 10 MG PO TABS: 20.0000 mg | ORAL_TABLET | Freq: Every day | ORAL | 0 refills | Status: DC

## 2016-12-12 MED ORDER — METHYLPREDNISOLONE 4 MG PO TBPK: ORAL_TABLET | ORAL | 0 refills | Status: DC

## 2016-12-12 MED ORDER — SODIUM CHLORIDE 0.9 % IV SOLN
INTRAVENOUS | Status: DC
  Administered 2016-12-12: 16:00:00 via INTRAVENOUS
  Filled 2016-12-12 (×2): qty 1000

## 2016-12-12 MED ORDER — HEPARIN SOD (PORK) LOCK FLUSH 100 UNIT/ML IV SOLN
500.0000 [IU] | Freq: Once | INTRAVENOUS | Status: AC
  Administered 2016-12-12: 500 [IU] via INTRAVENOUS

## 2016-12-12 MED ORDER — METHYLPREDNISOLONE SODIUM SUCC 40 MG IJ SOLR
40.0000 mg | Freq: Once | INTRAMUSCULAR | Status: AC
  Administered 2016-12-12: 40 mg via INTRAVENOUS
  Filled 2016-12-12: qty 1

## 2016-12-12 NOTE — Progress Notes (Signed)
[facility] Initial consultation  Patient Care Team: Patient, [name], RN as Registered Nurse  CHIEF COMPLAINTS/PURPOSE OF CONSULTATION: Newly diagnosed ovarian cancer.  HISTORY OF PRESENTING ILLNESS: Katie Hill 66 y.o. female with in her usual health with recently diagnosed ovarian cancer who is referred by Dr. Theora Gianotti to me for evaluation and management of ovarian cancer. Patient reportsthat she has been in her  Usual health until about 1 month ago she started to feel right lower quadrant pain. She presented to emergency room for evaluation. CT imaging revealed a large pelvic mass and extensive adenopathy. 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 Patient reports healthy at baseline and has not follow-up with any doctor for her life. She is married, G0P0 She denies any fatigue, weight loss, chest pain, cough, shortness of breath, and lower extremity swelling. She noted some urinary symptoms with frequency of urination. She also mentioned that she intermittently pass some mucus in her stool.also some back pain which is new.  INTERVAL HISTORY Patient presents for evaluation on Day 12 of cycle 1, carbo and Taxol treatment due to worsening of skin rash. She is also here to recheck her sodium level. Rash started in her groin area 7 days after treatment and I prescribed hydrocortisone 2.5% cream topical. She reports the skin rash break out and now she has erythematous rash on her bilateral anterior and posterior side of thigh as well as some on her truck. Rash is ichy. Steroid cream is not helpful. She takes benadryl PRN.  She denies vomiting. Appetite has improved.   Review of Systems  Constitutional: Negative.   HENT:  Negative.   Eyes: Negative.   Respiratory: Negative.   Cardiovascular: Negative.   Gastrointestinal: Positive for abdominal pain.        Mucus in the stool  Endocrine: Negative.   Genitourinary: Positive for frequency.   Musculoskeletal: Positive for back pain.  Skin: Positive for itching and rash.  Neurological: Negative.   Hematological: Negative.   Psychiatric/Behavioral: Negative.     MEDICAL HISTORY: Past Medical History:  Diagnosis Date  . 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 Pelvic mass in female     SURGICAL HISTORY: Past Surgical History:  Procedure Laterality Date  . APPENDECTOMY    . PORTA CATH INSERTION N/A 11/27/2016   Procedure: Glori Luis Cath Insertion;  Surgeon: Algernon Huxley, MD;  Location: [facility];  Service: Cardiovascular;  Laterality: N/A;    SOCIAL HISTORY: Social History   Social History  . Marital status: Married    Spouse name: N/A  . Number of children: N/A  . Years of education: N/A   Occupational History  . Not on file.   Social History Main Topics  . Smoking status: Never Smoker  . Smokeless tobacco: Never Used  . Alcohol use Yes     Comment: occasional   . Drug use: No  . Sexual activity: Yes    Birth control/ protection: Post-menopausal   Other Topics Concern  . Not on file   Social History Narrative  . No narrative on file    FAMILY HISTORY Family History  Problem Relation Age of Onset  . Throat cancer Cousin   . Throat cancer Cousin     ALLERGIES:  has No Known Allergies.  MEDICATIONS:  Current Outpatient Prescriptions  Medication Sig Dispense Refill  . aspirin EC 325 MG  tablet Take 325 mg by mouth daily as needed for moderate pain.    . hydrocortisone 2.5 % cream Apply topically 2 (two) times daily. 30 g 0  . lidocaine-prilocaine (EMLA) cream Apply 1 application topically as needed. Apply small amount to port site at least 1 hour prior to it being accessed, cover with plastic wrap 30 g 1  . ondansetron (ZOFRAN) 4 MG tablet Take 1 tablet (4 mg total) by mouth daily as needed for nausea or vomiting. 30 tablet 0  . oxyCODONE (OXY  IR/ROXICODONE) 5 MG immediate release tablet Take 1 tablet (5 mg total) by mouth every 4 (four) hours as needed for severe pain. 30 tablet 0  . predniSONE (DELTASONE) 10 MG tablet Take 2 tablets (20 mg total) by mouth daily with breakfast. 10 tablet 0  . senna (SENOKOT) 8.6 MG TABS tablet Take 2 tablets (17.2 mg total) by mouth daily. Hold if loose stool. 120 each 0  . Tetrahydrozoline HCl (REDNESS RELIEVER EYE DROPS OP) Apply 1 drop to eye daily as needed (red eye).    . methylPREDNISolone (MEDROL DOSEPAK) 4 MG TBPK tablet Day 1 take 6 tablets Day 2 take 5 tablets,  Day 3 take 4 tablets,  Day 4 take 3 tablets,  Day 5 take 2 tablets,  Day 6 take 1 tablet then stop. 21 tablet 0   Current Facility-Administered Medications  Medication Dose Route Frequency Provider Last Rate Last Dose  . ondansetron (ZOFRAN) injection 4 mg  4 mg Intravenous Once Algernon Huxley, MD        PHYSICAL EXAMINATION:  ECOG PERFORMANCE STATUS: 1 - Symptomatic but completely ambulatory   Vitals:   12/12/16 1516  BP: 111/63  Pulse: 79  Temp: 98.2 F (36.8 C)    There were no vitals filed for this visit.   Physical Exam GENERAL: No distress, well nourished.  SKIN:  No rashes or significant lesions  HEAD: Normocephalic, No masses, lesions, tenderness or abnormalities  EYES: Conjunctiva are pink, non icteric ENT: External ears normal ,lips , buccal mucosa, and tongue normal and mucous membranes are moist  LYMPH: No palpable cervical and axillary lymphadenopathy  LUNGS: Clear to auscultation, no crackles or wheezes HEART: Regular rate & rhythm, no murmurs, no gallops, S1 normal and S2 normal  ABDOMEN: Abdomen soft,  normal bowel sounds, She has right lower quadrant tenderness.  MUSCULOSKELETAL: No CVA tenderness and no tenderness on percussion of the back or rib cage.  EXTREMITIES: No edema, no skin discoloration or tenderness NEURO: Alert & oriented, no focal motor/sensory deficits. SKIN: maculopapular rash  on both lower extremities as well as trunk, less than 50% BSA  LABORATORY DATA: I have personally reviewed the data as listed: CBC    Component Value Date/Time   WBC 3.5 (L) 12/08/2016 1335   RBC 3.34 (L) 12/08/2016 1335   HGB 9.5 (L) 12/08/2016 1335   HCT 28.1 (L) 12/08/2016 1335   PLT 254 12/08/2016 1335   MCV 84.1 12/08/2016 1335   MCH 28.6 12/08/2016 1335   MCHC 34.0 12/08/2016 1335   RDW 16.1 (H) 12/08/2016 1335   LYMPHSABS 0.6 (L) 12/08/2016 1335   MONOABS 0.2 12/08/2016 1335   EOSABS 0.1 12/08/2016 1335   BASOSABS 0.0 12/08/2016 1335  Results for Katie Hill, Katie Hill (MRN [id number]) as of 11/22/2016 21:23  Ref. Range 11/16/2016 08:47  Cancer Antigen (CA) 125 Latest Ref Range: 0.0 - 38.1 U/mL 4,382.0 (H)  CEA Latest Ref Range: 0.0 - 4.7 ng/mL 0.7  Results for Katie Hill, Katie Hill (MRN [id number]) as of 12/01/2016 09:50  Ref. Range 11/22/2016 06:06  Iron Latest Ref Range: 28 - 170 ug/dL 35  UIBC Latest Units: ug/dL 265  TIBC Latest Ref Range: 250 - 450 ug/dL 300  Saturation Ratios Latest Ref Range: 10.4 - 31.8 % 12  Ferritin Latest Ref Range: 11 - 307 ng/mL 299    pathology 11/16/2016 Surgical Pathology  CASE: [id number]  PATIENT: Katie Hill  Surgical Pathology Report   SPECIMEN SUBMITTED:  A. Retroperitoneal adenopathy, left  DIAGNOSIS:  A. LYMPH NODE, LEFT RETROPERITONEAL; CT-GUIDED CORE BIOPSY:  - METASTATIC HIGH-GRADE SEROUS CARCINOMA.   Comment:   RADIOGRAPHIC STUDIES: I have personally reviewed the radiological images as listed and agree with the findings in the report CT chest abdomen pelvis with contrast 11/13/2016 IMPRESSION: 1. Large heterogeneous central pelvic mass measures up to 12.6 cm. The lesion generates substantial mass-effect on the uterus, bladder, and pelvic sidewall anatomy. The mass appears to invade the mid sigmoid colon. Etiology of the central pelvic mass is not definitive by CT, but ovarian primary is suspected. The lesion becomes  indistinguishable from the posterior uterus on some images and uterine origin is possible, but the uterus does not appear to be the epicenter of the mass and appears to be more displaced by it. MRI of the pelvis without and with contrast may prove helpful to better delineate the relationship of the uterus to the central pelvic mass although it may not be able to definitively localize the origin. 2. Bulky retroperitoneal and left pelvic sidewall lymphadenopathy. The retroperitoneal lymphadenopathy generates substantial mass-effect on the IVC and left renal vein. The external iliac veins along each pelvic sidewall are markedly attenuated by the mass/lymphadenopathy. Patency of these vessels cannot be definitely confirmed on this exam.   ASSESSMENT/PLAN Cancer Staging High grade ovarian cancer [facility]) Staging form: Ovary, Fallopian Tube, and Primary Peritoneal Carcinoma, AJCC 8th Edition - Clinical stage from 11/22/2016: Stage IIIC (cT3c, cN1b, cM0) - Signed by Earlie Server, MD on 11/22/2016  1. Rash/skin eruption   2. High grade ovarian cancer ([place])   3. Neoplasm related pain   4. Hyponatremia    #  Day 12 of first cycle of Carbo and Taxol.  # Hyponatremia with low plasma osmolarity. Possible SIADH. Check urine osmolarity. IV 1 L NS.  # Continue oxycodone 5mg  Q4-6 hours as needed.  # Grade 2 maculopapular skin rash likely chemotherapy drug induced (taxol?). Will give 40mg  IV solumedrol today and also start her on Medrol pack (21 tabs) from yesterday.   Follow-up on 1 week.  All questions were answered. The patient knows to call the clinic with any problems, questions or concerns.  No orders of the defined types were placed in this encounter.   Earlie Server, MD  12/12/2016 3:14 PM

## 2016-12-13 ENCOUNTER — Encounter

## 2016-12-19 ENCOUNTER — Inpatient Hospital Stay

## 2016-12-19 ENCOUNTER — Inpatient Hospital Stay (HOSPITAL_BASED_OUTPATIENT_CLINIC_OR_DEPARTMENT_OTHER): Admitting: Oncology

## 2016-12-19 ENCOUNTER — Encounter: Payer: Self-pay | Admitting: Oncology

## 2016-12-19 VITALS — BP 109/68 | HR 69 | Temp 97.7°F | Resp 16 | Wt 111.2 lb

## 2016-12-19 DIAGNOSIS — R21 Rash and other nonspecific skin eruption: Secondary | ICD-10-CM

## 2016-12-19 DIAGNOSIS — C569 Malignant neoplasm of unspecified ovary: Secondary | ICD-10-CM

## 2016-12-19 DIAGNOSIS — D649 Anemia, unspecified: Secondary | ICD-10-CM

## 2016-12-19 DIAGNOSIS — Z5111 Encounter for antineoplastic chemotherapy: Secondary | ICD-10-CM | POA: Diagnosis not present

## 2016-12-19 DIAGNOSIS — Z79899 Other long term (current) drug therapy: Secondary | ICD-10-CM

## 2016-12-19 DIAGNOSIS — E871 Hypo-osmolality and hyponatremia: Secondary | ICD-10-CM | POA: Diagnosis not present

## 2016-12-19 DIAGNOSIS — G893 Neoplasm related pain (acute) (chronic): Secondary | ICD-10-CM | POA: Diagnosis not present

## 2016-12-19 LAB — COMPREHENSIVE METABOLIC PANEL
ALT: 11 U/L — ABNORMAL LOW (ref 14–54)
ANION GAP: 4 — AB (ref 5–15)
AST: 20 U/L (ref 15–41)
Albumin: 3.7 g/dL (ref 3.5–5.0)
Alkaline Phosphatase: 74 U/L (ref 38–126)
BILIRUBIN TOTAL: 0.3 mg/dL (ref 0.3–1.2)
BUN: 12 mg/dL (ref 6–20)
CHLORIDE: 103 mmol/L (ref 101–111)
CO2: 26 mmol/L (ref 22–32)
Calcium: 9.4 mg/dL (ref 8.9–10.3)
Creatinine, Ser: 0.56 mg/dL (ref 0.44–1.00)
Glucose, Bld: 74 mg/dL (ref 65–99)
POTASSIUM: 3.7 mmol/L (ref 3.5–5.1)
Sodium: 133 mmol/L — ABNORMAL LOW (ref 135–145)
TOTAL PROTEIN: 7.2 g/dL (ref 6.5–8.1)

## 2016-12-19 LAB — CBC WITH DIFFERENTIAL/PLATELET
Basophils Absolute: 0 10*3/uL (ref 0–0.1)
Basophils Relative: 0 %
Eosinophils Absolute: 0 10*3/uL (ref 0–0.7)
Eosinophils Relative: 1 %
HEMATOCRIT: 31.7 % — AB (ref 35.0–47.0)
Hemoglobin: 10.8 g/dL — ABNORMAL LOW (ref 12.0–16.0)
LYMPHS ABS: 1 10*3/uL (ref 1.0–3.6)
LYMPHS PCT: 23 %
MCH: 28.5 pg (ref 26.0–34.0)
MCHC: 34 g/dL (ref 32.0–36.0)
MCV: 83.6 fL (ref 80.0–100.0)
MONO ABS: 0.8 10*3/uL (ref 0.2–0.9)
MONOS PCT: 18 %
NEUTROS ABS: 2.4 10*3/uL (ref 1.4–6.5)
Neutrophils Relative %: 58 %
PLATELETS: 341 10*3/uL (ref 150–440)
RBC: 3.79 MIL/uL — ABNORMAL LOW (ref 3.80–5.20)
RDW: 16.9 % — AB (ref 11.5–14.5)
WBC: 4.1 10*3/uL (ref 3.6–11.0)

## 2016-12-19 MED ORDER — HEPARIN SOD (PORK) LOCK FLUSH 100 UNIT/ML IV SOLN
INTRAVENOUS | Status: AC
  Filled 2016-12-19: qty 5

## 2016-12-19 MED ORDER — OMEPRAZOLE 40 MG PO CPDR: 40.0000 mg | DELAYED_RELEASE_CAPSULE | Freq: Every day | ORAL | 0 refills | Status: DC

## 2016-12-19 MED ORDER — DEXAMETHASONE 4 MG PO TABS: 20.0000 mg | ORAL_TABLET | Freq: Every day | ORAL | 2 refills | Status: DC

## 2016-12-19 MED ORDER — HEPARIN SOD (PORK) LOCK FLUSH 100 UNIT/ML IV SOLN
500.0000 [IU] | Freq: Once | INTRAVENOUS | Status: AC
  Administered 2016-12-19: 500 [IU] via INTRAVENOUS

## 2016-12-19 MED ORDER — SODIUM CHLORIDE 0.9% FLUSH
10.0000 mL | INTRAVENOUS | Status: DC | PRN
  Administered 2016-12-19: 10 mL via INTRAVENOUS
  Filled 2016-12-19: qty 10

## 2016-12-19 NOTE — Progress Notes (Signed)
Patient here today for follow up.   

## 2016-12-19 NOTE — Progress Notes (Signed)
[facility] Initial consultation  Patient Care Team: Patient, [name], RN as Registered Nurse  CHIEF COMPLAINTS/PURPOSE OF CONSULTATION: Newly diagnosed ovarian cancer.  HISTORY OF PRESENTING ILLNESS: Katie Hill 66 y.o. female with in her usual health with recently diagnosed ovarian cancer who is referred by Dr. Theora Gianotti to me for evaluation and management of ovarian cancer. Patient reportsthat she has been in her  Usual health until about 1 month ago she started to feel right lower quadrant pain. She presented to emergency room for evaluation. CT imaging revealed a large pelvic mass and extensive adenopathy. 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 Patient reports healthy at baseline and has not follow-up with any doctor for her life. She is married, G0P0 She denies any fatigue, weight loss, chest pain, cough, shortness of breath, and lower extremity swelling. She noted some urinary symptoms with frequency of urination. She also mentioned that she intermittently pass some mucus in her stool.also some back pain which is new.  INTERVAL HISTORY Patient presents for evaluation of chemotherapy related skin toxicity. She finished her medrol pack and rash has improved. She denies vomiting. Appetite has improved. She subjectively feel that her urinary symptoms and lower abdominal symptoms are better.   Review of Systems  Constitutional: Negative.   HENT:  Negative.   Eyes: Negative.   Respiratory: Negative.   Cardiovascular: Negative.   Gastrointestinal: Negative.   Endocrine: Negative.   Genitourinary: Positive for frequency.   Musculoskeletal: Positive for back pain.  Skin: Positive for itching and rash.       Improved.   Neurological: Negative.   Hematological: Negative.   Psychiatric/Behavioral: Negative.     MEDICAL HISTORY: Past Medical  History:  Diagnosis Date  . 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 Pelvic mass in female     SURGICAL HISTORY: Past Surgical History:  Procedure Laterality Date  . APPENDECTOMY    . PORTA CATH INSERTION N/A 11/27/2016   Procedure: Glori Luis Cath Insertion;  Surgeon: Algernon Huxley, MD;  Location: [facility];  Service: Cardiovascular;  Laterality: N/A;    SOCIAL HISTORY: Social History   Social History  . Marital status: Married    Spouse name: N/A  . Number of children: N/A  . Years of education: N/A   Occupational History  . Not on file.   Social History Main Topics  . Smoking status: Never Smoker  . Smokeless tobacco: Never Used  . Alcohol use Yes     Comment: occasional   . Drug use: No  . Sexual activity: Yes    Birth control/ protection: Post-menopausal   Other Topics Concern  . Not on file   Social History Narrative  . No narrative on file    FAMILY HISTORY Family History  Problem Relation Age of Onset  . Throat cancer Cousin   . Throat cancer Cousin     ALLERGIES:  has No Known Allergies.  MEDICATIONS:  Current Outpatient Prescriptions  Medication Sig Dispense Refill  . aspirin EC 325 MG tablet Take 325 mg by mouth daily as needed for moderate pain.    . hydrocortisone 2.5 % cream Apply topically 2 (two) times daily. 30 g 0  . lidocaine-prilocaine (EMLA) cream Apply 1 application topically as needed. Apply small amount to port site at least 1 hour prior to it being accessed, cover with plastic wrap 30 g  1  . ondansetron (ZOFRAN) 4 MG tablet Take 1 tablet (4 mg total) by mouth daily as needed for nausea or vomiting. 30 tablet 0  . oxyCODONE (OXY IR/ROXICODONE) 5 MG immediate release tablet Take 1 tablet (5 mg total) by mouth every 4 (four) hours as needed for severe pain. 30 tablet 0  . senna (SENOKOT) 8.6 MG TABS tablet Take 2 tablets (17.2 mg total) by mouth daily. Hold if loose stool. 120 each 0  . Tetrahydrozoline HCl (REDNESS RELIEVER  EYE DROPS OP) Apply 1 drop to eye daily as needed (red eye).    [name] dexamethasone (DECADRON) 4 MG tablet Take 5 tablets (20 mg total) by mouth daily. Take 20 mg daily for 2 days prior to your chemotherapy and take 20mg  daily for 2 days after chemotherapy. 20 tablet 2  . omeprazole (PRILOSEC) 40 MG capsule Take 1 capsule (40 mg total) by mouth daily. 30 capsule 0   Current Facility-Administered Medications  Medication Dose Route Frequency Provider Last Rate Last Dose  . ondansetron (ZOFRAN) injection 4 mg  4 mg Intravenous Once Algernon Huxley, MD        PHYSICAL EXAMINATION:  ECOG PERFORMANCE STATUS: 0 - Asymptomatic   Vitals:   12/19/16 0901  BP: 109/68  Pulse: 69  Resp: 16  Temp: 97.7 F (36.5 C)    Filed Weights   12/19/16 0901  Weight: 111 lb 4 oz (50.5 kg)     Physical Exam GENERAL: No distress, well nourished.  SKIN:  No rashes or significant lesions  HEAD: Normocephalic, No masses, lesions, tenderness or abnormalities  EYES: Conjunctiva are pink, non icteric ENT: External ears normal ,lips , buccal mucosa, and tongue normal and mucous membranes are moist  LYMPH: No palpable cervical and axillary lymphadenopathy  LUNGS: Clear to auscultation, no crackles or wheezes HEART: Regular rate & rhythm, no murmurs, no gallops, S1 normal and S2 normal  ABDOMEN: Abdomen soft,  normal bowel sounds, She has right lower quadrant tenderness.  MUSCULOSKELETAL: No CVA tenderness and no tenderness on percussion of the back or rib cage.  EXTREMITIES: No edema, no skin discoloration or tenderness NEURO: Alert & oriented, no focal motor/sensory deficits. SKIN: maculopapular rash on both lower extremities as well as trunk, less than 50% BSA, have significantly improved.   LABORATORY DATA: I have personally reviewed the data as listed: CBC    Component Value Date/Time   WBC 4.1 12/19/2016 0822   RBC 3.79 (L) 12/19/2016 0822   HGB 10.8 (L) 12/19/2016 0822   HCT 31.7 (L) 12/19/2016 0822    PLT 341 12/19/2016 0822   MCV 83.6 12/19/2016 0822   MCH 28.5 12/19/2016 0822   MCHC 34.0 12/19/2016 0822   RDW 16.9 (H) 12/19/2016 0822   LYMPHSABS 1.0 12/19/2016 0822   MONOABS 0.8 12/19/2016 0822   EOSABS 0.0 12/19/2016 0822   BASOSABS 0.0 12/19/2016 0822  Results for Katie Hill, Katie Hill (MRN [id number]) as of 11/22/2016 21:23  Ref. Range 11/16/2016 08:47  Cancer Antigen (CA) 125 Latest Ref Range: 0.0 - 38.1 U/mL 4,382.0 (H)  CEA Latest Ref Range: 0.0 - 4.7 ng/mL 0.7  Results for Katie Hill, Katie Hill (MRN [id number]) as of 12/01/2016 09:50  Ref. Range 11/22/2016 06:06  Iron Latest Ref Range: 28 - 170 ug/dL 35  UIBC Latest Units: ug/dL 265  TIBC Latest Ref Range: 250 - 450 ug/dL 300  Saturation Ratios Latest Ref Range: 10.4 - 31.8 % 12  Ferritin Latest Ref Range: 11 - 307  ng/mL 299    pathology 11/16/2016 Surgical Pathology  CASE: [id number]  PATIENT: Katie Hill  Surgical Pathology Report   SPECIMEN SUBMITTED:  A. Retroperitoneal adenopathy, left  DIAGNOSIS:  A. LYMPH NODE, LEFT RETROPERITONEAL; CT-GUIDED CORE BIOPSY:  - METASTATIC HIGH-GRADE SEROUS CARCINOMA.   Comment:   RADIOGRAPHIC STUDIES: I have personally reviewed the radiological images as listed and agree with the findings in the report CT chest abdomen pelvis with contrast 11/13/2016 IMPRESSION: 1. Large heterogeneous central pelvic mass measures up to 12.6 cm. The lesion generates substantial mass-effect on the uterus, bladder, and pelvic sidewall anatomy. The mass appears to invade the mid sigmoid colon. Etiology of the central pelvic mass is not definitive by CT, but ovarian primary is suspected. The lesion becomes indistinguishable from the posterior uterus on some images and uterine origin is possible, but the uterus does not appear to be the epicenter of the mass and appears to be more displaced by it. MRI of the pelvis without and with contrast may prove helpful to better delineate the relationship  of the uterus to the central pelvic mass although it may not be able to definitively localize the origin. 2. Bulky retroperitoneal and left pelvic sidewall lymphadenopathy. The retroperitoneal lymphadenopathy generates substantial mass-effect on the IVC and left renal vein. The external iliac veins along each pelvic sidewall are markedly attenuated by the mass/lymphadenopathy. Patency of these vessels cannot be definitely confirmed on this exam.   ASSESSMENT/PLAN Cancer Staging High grade ovarian cancer [facility]) Staging form: Ovary, Fallopian Tube, and Primary Peritoneal Carcinoma, AJCC 8th Edition - Clinical stage from 11/22/2016: Stage IIIC (cT3c, cN1b, cM0) - Signed by Earlie Server, MD on 11/22/2016  1. Rash/skin eruption   2. High grade ovarian cancer ([place])   3. Neoplasm related pain   4. Hyponatremia   5. Normocytic anemia    # Chemotherapy related skin toxicity. She improved with medrol pack.  # Otherwise tolerate chemotherapy well.  # Hyponatremia with low plasma osmolarity and low urine osmalarity, likely due to SIADH. Today's lab shows improvement.  # Continue oxycodone 5mg  Q4-6 hours as needed.  # For cycle 2, will ask her to take Dexamethason 20mg  x 2 days prior and after chemotherapy.   Follow-up on 1 week on Day 1 of cycle 2 Carbo Taxol w cbc, cmp, CA 125.  All questions were answered. The patient knows to call the clinic with any problems, questions or concerns.  Orders Placed This Encounter  Procedures  . CA 125    Standing Status:   Standing    Number of Occurrences:   4    Standing Expiration Date:   12/19/2017    Earlie Server, MD  12/19/2016 9:27 AM

## 2016-12-25 ENCOUNTER — Inpatient Hospital Stay

## 2016-12-25 ENCOUNTER — Inpatient Hospital Stay (HOSPITAL_BASED_OUTPATIENT_CLINIC_OR_DEPARTMENT_OTHER): Admitting: Oncology

## 2016-12-25 ENCOUNTER — Encounter: Payer: Self-pay | Admitting: Oncology

## 2016-12-25 VITALS — BP 117/68 | HR 68 | Temp 96.6°F | Resp 16 | Wt 112.1 lb

## 2016-12-25 DIAGNOSIS — C801 Malignant (primary) neoplasm, unspecified: Secondary | ICD-10-CM

## 2016-12-25 DIAGNOSIS — D649 Anemia, unspecified: Secondary | ICD-10-CM

## 2016-12-25 DIAGNOSIS — Z5111 Encounter for antineoplastic chemotherapy: Secondary | ICD-10-CM | POA: Diagnosis not present

## 2016-12-25 DIAGNOSIS — Z79899 Other long term (current) drug therapy: Secondary | ICD-10-CM | POA: Diagnosis not present

## 2016-12-25 DIAGNOSIS — R21 Rash and other nonspecific skin eruption: Secondary | ICD-10-CM | POA: Diagnosis not present

## 2016-12-25 DIAGNOSIS — G893 Neoplasm related pain (acute) (chronic): Secondary | ICD-10-CM | POA: Diagnosis not present

## 2016-12-25 DIAGNOSIS — E871 Hypo-osmolality and hyponatremia: Secondary | ICD-10-CM | POA: Diagnosis not present

## 2016-12-25 DIAGNOSIS — C569 Malignant neoplasm of unspecified ovary: Secondary | ICD-10-CM

## 2016-12-25 LAB — COMPREHENSIVE METABOLIC PANEL
ALT: 11 U/L — ABNORMAL LOW (ref 14–54)
ANION GAP: 8 (ref 5–15)
AST: 16 U/L (ref 15–41)
Albumin: 3.8 g/dL (ref 3.5–5.0)
Alkaline Phosphatase: 74 U/L (ref 38–126)
BUN: 20 mg/dL (ref 6–20)
CALCIUM: 9.7 mg/dL (ref 8.9–10.3)
CHLORIDE: 100 mmol/L — AB (ref 101–111)
CO2: 23 mmol/L (ref 22–32)
Creatinine, Ser: 0.52 mg/dL (ref 0.44–1.00)
Glucose, Bld: 119 mg/dL — ABNORMAL HIGH (ref 65–99)
Potassium: 4.1 mmol/L (ref 3.5–5.1)
SODIUM: 131 mmol/L — AB (ref 135–145)
Total Bilirubin: 0.4 mg/dL (ref 0.3–1.2)
Total Protein: 7.6 g/dL (ref 6.5–8.1)

## 2016-12-25 LAB — CBC WITH DIFFERENTIAL/PLATELET
Basophils Absolute: 0 10*3/uL (ref 0–0.1)
Basophils Relative: 0 %
EOS ABS: 0 10*3/uL (ref 0–0.7)
EOS PCT: 0 %
HCT: 32 % — ABNORMAL LOW (ref 35.0–47.0)
Hemoglobin: 10.8 g/dL — ABNORMAL LOW (ref 12.0–16.0)
LYMPHS ABS: 0.6 10*3/uL — AB (ref 1.0–3.6)
Lymphocytes Relative: 3 %
MCH: 28.7 pg (ref 26.0–34.0)
MCHC: 33.8 g/dL (ref 32.0–36.0)
MCV: 84.7 fL (ref 80.0–100.0)
MONO ABS: 0.5 10*3/uL (ref 0.2–0.9)
Monocytes Relative: 3 %
Neutro Abs: 17.8 10*3/uL — ABNORMAL HIGH (ref 1.4–6.5)
Neutrophils Relative %: 94 %
PLATELETS: 212 10*3/uL (ref 150–440)
RBC: 3.78 MIL/uL — ABNORMAL LOW (ref 3.80–5.20)
RDW: 17.6 % — AB (ref 11.5–14.5)
WBC: 19 10*3/uL — AB (ref 3.6–11.0)

## 2016-12-25 MED ORDER — DIPHENHYDRAMINE HCL 50 MG/ML IJ SOLN
50.0000 mg | Freq: Once | INTRAMUSCULAR | Status: AC
  Administered 2016-12-25: 50 mg via INTRAVENOUS
  Filled 2016-12-25: qty 1

## 2016-12-25 MED ORDER — HEPARIN SOD (PORK) LOCK FLUSH 100 UNIT/ML IV SOLN
500.0000 [IU] | Freq: Once | INTRAVENOUS | Status: AC
  Administered 2016-12-25: 500 [IU] via INTRAVENOUS
  Filled 2016-12-25: qty 5

## 2016-12-25 MED ORDER — PALONOSETRON HCL INJECTION 0.25 MG/5ML
0.2500 mg | Freq: Once | INTRAVENOUS | Status: AC
  Administered 2016-12-25: 0.25 mg via INTRAVENOUS
  Filled 2016-12-25: qty 5

## 2016-12-25 MED ORDER — PACLITAXEL CHEMO INJECTION 300 MG/50ML
175.0000 mg/m2 | Freq: Once | INTRAVENOUS | Status: AC
  Administered 2016-12-25: 276 mg via INTRAVENOUS
  Filled 2016-12-25: qty 46

## 2016-12-25 MED ORDER — SODIUM CHLORIDE 0.9 % IV SOLN
20.0000 mg | Freq: Once | INTRAVENOUS | Status: AC
  Administered 2016-12-25: 20 mg via INTRAVENOUS
  Filled 2016-12-25: qty 2

## 2016-12-25 MED ORDER — SODIUM CHLORIDE 0.9 % IV SOLN
Freq: Once | INTRAVENOUS | Status: AC
  Administered 2016-12-25: 10:00:00 via INTRAVENOUS
  Filled 2016-12-25: qty 1000

## 2016-12-25 MED ORDER — SODIUM CHLORIDE 0.9 % IV SOLN
417.5000 mg | Freq: Once | INTRAVENOUS | Status: AC
  Administered 2016-12-25: 420 mg via INTRAVENOUS
  Filled 2016-12-25: qty 42

## 2016-12-25 MED ORDER — SODIUM CHLORIDE 0.9% FLUSH
10.0000 mL | INTRAVENOUS | Status: DC | PRN
  Administered 2016-12-25: 10 mL via INTRAVENOUS
  Filled 2016-12-25: qty 10

## 2016-12-25 MED ORDER — FAMOTIDINE IN NACL 20-0.9 MG/50ML-% IV SOLN
20.0000 mg | Freq: Once | INTRAVENOUS | Status: AC
  Administered 2016-12-25: 20 mg via INTRAVENOUS
  Filled 2016-12-25: qty 50

## 2016-12-25 NOTE — Progress Notes (Signed)
[facility] Initial consultation  Patient Care Team: Patient, [name], RN as Registered Nurse  CHIEF COMPLAINTS/PURPOSE OF CONSULTATION: Newly diagnosed ovarian cancer.  HISTORY OF PRESENTING ILLNESS: Katie Hill 66 y.o. female with in her usual health with recently diagnosed ovarian cancer who is referred by Dr. Theora Gianotti to me for evaluation and management of ovarian cancer. Patient reportsthat she has been in her  Usual health until about 1 month ago she started to feel right lower quadrant pain. She presented to emergency room for evaluation. CT imaging revealed a large pelvic mass and extensive adenopathy. 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 Patient reports healthy at baseline and has not follow-up with any doctor for her life. She is married, G0P0 She denies any fatigue, weight loss, chest pain, cough, shortness of breath, and lower extremity swelling. She noted some urinary symptoms with frequency of urination. She also mentioned that she intermittently pass some mucus in her stool.also some back pain which is new.  INTERVAL HISTORY Patient presents for evaluation prior to cycle 2 chemotherapy   Appetite has improved and she has gained one pound. She subjectively feel that her urinary symptoms and lower abdominal symptoms are better.  Rash has improved. Over the weekend, she took 20mg  Dexamethasone for 2 days prior to her treatment today. She also took PPI for GI prophylaxis.  She reports developing a different kind of rash on her abdominal wall, itchy, starting yesterday, after PPI and steroids. She can not sleep well.   Review of Systems  Constitutional: Negative.   HENT:  Negative.   Eyes: Negative.   Respiratory: Negative.   Cardiovascular: Negative.   Gastrointestinal: Negative.   Endocrine: Negative.   Genitourinary: Positive for  frequency.        Improved   Musculoskeletal: Negative.   Skin: Positive for itching and rash.       Improved.   Neurological: Negative.   Hematological: Negative.   Psychiatric/Behavioral: Negative.     MEDICAL HISTORY: Past Medical History:  Diagnosis Date  . High grade ovarian cancer ([place]) 11/20/2016  . Pelvic mass in female     SURGICAL HISTORY: Past Surgical History:  Procedure Laterality Date  . APPENDECTOMY    . PORTA CATH INSERTION N/A 11/27/2016   Procedure: Glori Luis Cath Insertion;  Surgeon: Algernon Huxley, MD;  Location: [facility];  Service: Cardiovascular;  Laterality: N/A;    SOCIAL HISTORY: Social History   Social History  . Marital status: Married    Spouse name: N/A  . Number of children: N/A  . Years of education: N/A   Occupational History  . Not on file.   Social History Main Topics  . Smoking status: Never Smoker  . Smokeless tobacco: Never Used  . Alcohol use Yes     Comment: occasional   . Drug use: No  . Sexual activity: Yes    Birth control/ protection: Post-menopausal   Other Topics Concern  . Not on file   Social History Narrative  . No narrative on file    FAMILY HISTORY Family History  Problem Relation Age of Onset  . Throat cancer Cousin   . Throat cancer Cousin     ALLERGIES:  has No Known Allergies.  MEDICATIONS:  Current Outpatient Prescriptions  Medication Sig Dispense Refill  . aspirin EC 325 MG tablet Take 325 mg by mouth daily as  needed for moderate pain.    [name] dexamethasone (DECADRON) 4 MG tablet Take 5 tablets (20 mg total) by mouth daily. Take 20 mg daily for 2 days prior to your chemotherapy and take 20mg  daily for 2 days after chemotherapy. 20 tablet 2  . hydrocortisone 2.5 % cream Apply topically 2 (two) times daily. 30 g 0  . lidocaine-prilocaine (EMLA) cream Apply 1 application topically as needed. Apply small amount to port site at least 1 hour prior to it being accessed, cover with plastic wrap 30 g 1   . omeprazole (PRILOSEC) 40 MG capsule Take 1 capsule (40 mg total) by mouth daily. 30 capsule 0  . ondansetron (ZOFRAN) 4 MG tablet Take 1 tablet (4 mg total) by mouth daily as needed for nausea or vomiting. 30 tablet 0  . oxyCODONE (OXY IR/ROXICODONE) 5 MG immediate release tablet Take 1 tablet (5 mg total) by mouth every 4 (four) hours as needed for severe pain. 30 tablet 0  . senna (SENOKOT) 8.6 MG TABS tablet Take 2 tablets (17.2 mg total) by mouth daily. Hold if loose stool. 120 each 0  . Tetrahydrozoline HCl (REDNESS RELIEVER EYE DROPS OP) Apply 1 drop to eye daily as needed (red eye).     Current Facility-Administered Medications  Medication Dose Route Frequency Provider Last Rate Last Dose  . ondansetron (ZOFRAN) injection 4 mg  4 mg Intravenous Once Algernon Huxley, MD        PHYSICAL EXAMINATION:  ECOG PERFORMANCE STATUS: 0 - Asymptomatic   Vitals:   12/25/16 0945  BP: 117/68  Pulse: 68  Resp: 16  Temp: (!) 96.6 F (35.9 C)    Filed Weights   12/25/16 0942  Weight: 112 lb 1 oz (50.8 kg)     Physical Exam GENERAL: No distress, well nourished.  SKIN:  No rashes or significant lesions  HEAD: Normocephalic, No masses, lesions, tenderness or abnormalities  EYES: Conjunctiva are pink, non icteric ENT: External ears normal ,lips , buccal mucosa, and tongue normal and mucous membranes are moist  LYMPH: No palpable cervical and axillary lymphadenopathy  LUNGS: Clear to auscultation, no crackles or wheezes HEART: Regular rate & rhythm, no murmurs, no gallops, S1 normal and S2 normal  ABDOMEN: Abdomen soft,  normal bowel sounds, She has right lower quadrant tenderness.  MUSCULOSKELETAL: No CVA tenderness and no tenderness on percussion of the back or rib cage.  EXTREMITIES: No edema, no skin discoloration or tenderness NEURO: Alert & oriented, no focal motor/sensory deficits. SKIN: scatted maculopapular rash on both lower extremities as well as trunk, less than 50% BSA, have  significantly improved. A few round erythematous rash on abdominal wall.   LABORATORY DATA: I have personally reviewed the data as listed: CBC    Component Value Date/Time   WBC 4.1 12/19/2016 0822   RBC 3.79 (L) 12/19/2016 0822   HGB 10.8 (L) 12/19/2016 0822   HCT 31.7 (L) 12/19/2016 0822   PLT 341 12/19/2016 0822   MCV 83.6 12/19/2016 0822   MCH 28.5 12/19/2016 0822   MCHC 34.0 12/19/2016 0822   RDW 16.9 (H) 12/19/2016 0822   LYMPHSABS 1.0 12/19/2016 0822   MONOABS 0.8 12/19/2016 0822   EOSABS 0.0 12/19/2016 0822   BASOSABS 0.0 12/19/2016 0822  Results for Katie Hill, Katie Hill (MRN [id number]) as of 11/22/2016 21:23  Ref. Range 11/16/2016 08:47  Cancer Antigen (CA) 125 Latest Ref Range: 0.0 - 38.1 U/mL 4,382.0 (H)  CEA Latest Ref Range: 0.0 - 4.7 ng/mL 0.7  Results for Katie Hill, Katie Hill (MRN [id number]) as of 12/01/2016 09:50  Ref. Range 11/22/2016 06:06  Iron Latest Ref Range: 28 - 170 ug/dL 35  UIBC Latest Units: ug/dL 265  TIBC Latest Ref Range: 250 - 450 ug/dL 300  Saturation Ratios Latest Ref Range: 10.4 - 31.8 % 12  Ferritin Latest Ref Range: 11 - 307 ng/mL 299    pathology 11/16/2016 Surgical Pathology  CASE: [id number]  PATIENT: Katie Hill  Surgical Pathology Report   SPECIMEN SUBMITTED:  A. Retroperitoneal adenopathy, left  DIAGNOSIS:  A. LYMPH NODE, LEFT RETROPERITONEAL; CT-GUIDED CORE BIOPSY:  - METASTATIC HIGH-GRADE SEROUS CARCINOMA.   Comment:   RADIOGRAPHIC STUDIES: I have personally reviewed the radiological images as listed and agree with the findings in the report CT chest abdomen pelvis with contrast 11/13/2016 IMPRESSION: 1. Large heterogeneous central pelvic mass measures up to 12.6 cm. The lesion generates substantial mass-effect on the uterus, bladder, and pelvic sidewall anatomy. The mass appears to invade the mid sigmoid colon. Etiology of the central pelvic mass is not definitive by CT, but ovarian primary is suspected. The lesion  becomes indistinguishable from the posterior uterus on some images and uterine origin is possible, but the uterus does not appear to be the epicenter of the mass and appears to be more displaced by it. MRI of the pelvis without and with contrast may prove helpful to better delineate the relationship of the uterus to the central pelvic mass although it may not be able to definitively localize the origin. 2. Bulky retroperitoneal and left pelvic sidewall lymphadenopathy. The retroperitoneal lymphadenopathy generates substantial mass-effect on the IVC and left renal vein. The external iliac veins along each pelvic sidewall are markedly attenuated by the mass/lymphadenopathy. Patency of these vessels cannot be definitely confirmed on this exam.   ASSESSMENT/PLAN Cancer Staging High grade ovarian cancer [facility]) Staging form: Ovary, Fallopian Tube, and Primary Peritoneal Carcinoma, AJCC 8th Edition - Clinical stage from 11/22/2016: Stage IIIC (cT3c, cN1b, cM0) - Signed by Earlie Server, MD on 11/22/2016  1. High grade ovarian cancer ([place])   2. Neoplasm related pain   3. Hyponatremia   4. Rash/skin eruption   5. Encounter for antineoplastic chemotherapy    # OK to proceed to cycle 2, She has taken Dexamethason 20mg  x 2 days prior and advise her to take for 2 days after chemotherapy.  # Chemotherapy related skin toxicity, treated with medrol pack. The etiology of mild new rash is unknown, only new medication is PPI. I suggest her to hold taking PPI, instead, she can take Tums as needed.  For the itchiness, she can due benadryl as needed.  # Hyponatremia with low plasma osmolarity and low urine osmalarity, likely due to SIADH. Today's lab shows improvement.  # Continue oxycodone 5mg  Q4-6 hours as needed. Patient reports pain has become better, she only need pain medication occationally. # plan CT scan after 3 cycles of treatment.    Follow-up on 3 weeks on Day 1 of cycle 3 Carbo Taxol w cbc, cmp, CA 125.   All questions were answered. The patient knows to call the clinic with any problems, questions or concerns.  No orders of the defined types were placed in this encounter.   Earlie Server, MD  12/25/2016 8:47 AM

## 2016-12-25 NOTE — Progress Notes (Signed)
Patient here today for follow up.  Patient c/o of rash on her trunk and also in groin area that has not resolved

## 2016-12-26 LAB — CA 125: Cancer Antigen (CA) 125: 699.1 U/mL — ABNORMAL HIGH (ref 0.0–38.1)

## 2017-01-15 ENCOUNTER — Inpatient Hospital Stay

## 2017-01-15 ENCOUNTER — Inpatient Hospital Stay: Attending: Oncology | Admitting: Oncology

## 2017-01-15 ENCOUNTER — Encounter: Payer: Self-pay | Admitting: Oncology

## 2017-01-15 VITALS — BP 112/66 | HR 67 | Temp 98.1°F | Wt 112.2 lb

## 2017-01-15 DIAGNOSIS — C569 Malignant neoplasm of unspecified ovary: Secondary | ICD-10-CM | POA: Diagnosis not present

## 2017-01-15 DIAGNOSIS — R599 Enlarged lymph nodes, unspecified: Secondary | ICD-10-CM | POA: Insufficient documentation

## 2017-01-15 DIAGNOSIS — Z791 Long term (current) use of non-steroidal anti-inflammatories (NSAID): Secondary | ICD-10-CM | POA: Diagnosis not present

## 2017-01-15 DIAGNOSIS — Z5111 Encounter for antineoplastic chemotherapy: Secondary | ICD-10-CM | POA: Diagnosis present

## 2017-01-15 DIAGNOSIS — R21 Rash and other nonspecific skin eruption: Secondary | ICD-10-CM | POA: Diagnosis not present

## 2017-01-15 DIAGNOSIS — G893 Neoplasm related pain (acute) (chronic): Secondary | ICD-10-CM | POA: Diagnosis not present

## 2017-01-15 DIAGNOSIS — E871 Hypo-osmolality and hyponatremia: Secondary | ICD-10-CM | POA: Insufficient documentation

## 2017-01-15 DIAGNOSIS — D649 Anemia, unspecified: Secondary | ICD-10-CM

## 2017-01-15 DIAGNOSIS — Z79899 Other long term (current) drug therapy: Secondary | ICD-10-CM | POA: Insufficient documentation

## 2017-01-15 LAB — CBC WITH DIFFERENTIAL/PLATELET
BASOS ABS: 0 10*3/uL (ref 0–0.1)
Basophils Relative: 0 %
EOS PCT: 0 %
Eosinophils Absolute: 0 10*3/uL (ref 0–0.7)
HEMATOCRIT: 30.1 % — AB (ref 35.0–47.0)
Hemoglobin: 10.3 g/dL — ABNORMAL LOW (ref 12.0–16.0)
LYMPHS ABS: 0.7 10*3/uL — AB (ref 1.0–3.6)
LYMPHS PCT: 10 %
MCH: 29.3 pg (ref 26.0–34.0)
MCHC: 34.3 g/dL (ref 32.0–36.0)
MCV: 85.4 fL (ref 80.0–100.0)
MONO ABS: 0.8 10*3/uL (ref 0.2–0.9)
MONOS PCT: 12 %
NEUTROS ABS: 5.5 10*3/uL (ref 1.4–6.5)
Neutrophils Relative %: 78 %
PLATELETS: 237 10*3/uL (ref 150–440)
RBC: 3.52 MIL/uL — ABNORMAL LOW (ref 3.80–5.20)
RDW: 18.5 % — AB (ref 11.5–14.5)
WBC: 7 10*3/uL (ref 3.6–11.0)

## 2017-01-15 LAB — COMPREHENSIVE METABOLIC PANEL
ALT: 11 U/L — ABNORMAL LOW (ref 14–54)
AST: 18 U/L (ref 15–41)
Albumin: 4 g/dL (ref 3.5–5.0)
Alkaline Phosphatase: 71 U/L (ref 38–126)
Anion gap: 5 (ref 5–15)
BUN: 18 mg/dL (ref 6–20)
CALCIUM: 9.9 mg/dL (ref 8.9–10.3)
CHLORIDE: 101 mmol/L (ref 101–111)
CO2: 25 mmol/L (ref 22–32)
CREATININE: 0.53 mg/dL (ref 0.44–1.00)
GFR calc Af Amer: >60 mL/min (ref >60)
Glucose, Bld: 110 mg/dL — ABNORMAL HIGH (ref 65–99)
Potassium: 3.8 mmol/L (ref 3.5–5.1)
Sodium: 131 mmol/L — ABNORMAL LOW (ref 135–145)
Total Bilirubin: 0.4 mg/dL (ref 0.3–1.2)
Total Protein: 7.2 g/dL (ref 6.5–8.1)

## 2017-01-15 MED ORDER — FAMOTIDINE IN NACL 20-0.9 MG/50ML-% IV SOLN
20.0000 mg | Freq: Once | INTRAVENOUS | Status: AC
  Administered 2017-01-15: 20 mg via INTRAVENOUS
  Filled 2017-01-15: qty 50

## 2017-01-15 MED ORDER — PACLITAXEL CHEMO INJECTION 300 MG/50ML
175.0000 mg/m2 | Freq: Once | INTRAVENOUS | Status: AC
  Administered 2017-01-15: 276 mg via INTRAVENOUS
  Filled 2017-01-15 (×2): qty 46

## 2017-01-15 MED ORDER — SODIUM CHLORIDE 0.9 % IV SOLN
20.0000 mg | Freq: Once | INTRAVENOUS | Status: AC
  Administered 2017-01-15: 20 mg via INTRAVENOUS
  Filled 2017-01-15 (×2): qty 2

## 2017-01-15 MED ORDER — SODIUM CHLORIDE 0.9 % IV SOLN
420.0000 mg | Freq: Once | INTRAVENOUS | Status: AC
  Administered 2017-01-15: 420 mg via INTRAVENOUS
  Filled 2017-01-15 (×2): qty 42

## 2017-01-15 MED ORDER — PALONOSETRON HCL INJECTION 0.25 MG/5ML
0.2500 mg | Freq: Once | INTRAVENOUS | Status: AC
  Administered 2017-01-15: 0.25 mg via INTRAVENOUS
  Filled 2017-01-15: qty 5

## 2017-01-15 MED ORDER — HEPARIN SOD (PORK) LOCK FLUSH 100 UNIT/ML IV SOLN
500.0000 [IU] | Freq: Once | INTRAVENOUS | Status: AC | PRN
  Administered 2017-01-15: 500 [IU]
  Filled 2017-01-15: qty 5

## 2017-01-15 MED ORDER — SODIUM CHLORIDE 0.9% FLUSH
10.0000 mL | INTRAVENOUS | Status: DC | PRN
  Administered 2017-01-15: 10 mL
  Filled 2017-01-15: qty 10

## 2017-01-15 MED ORDER — DIPHENHYDRAMINE HCL 50 MG/ML IJ SOLN
50.0000 mg | Freq: Once | INTRAMUSCULAR | Status: AC
  Administered 2017-01-15: 50 mg via INTRAVENOUS
  Filled 2017-01-15: qty 1

## 2017-01-15 MED ORDER — SODIUM CHLORIDE 0.9 % IV SOLN
Freq: Once | INTRAVENOUS | Status: AC
  Administered 2017-01-15: 10:00:00 via INTRAVENOUS
  Filled 2017-01-15: qty 1000

## 2017-01-15 NOTE — Progress Notes (Addendum)
Katie Hill  Patient Care Team: Patient, [name], RN as Registered Nurse  CHIEF COMPLAINTS/PURPOSE OF Hill Follow up for Treatment of  ovarian cancer.  HISTORY OF PRESENTING ILLNESS: Katie Hill 66 y.o. female presents for follow up of management of ovarian cancer. She is on Neoadjuvant chemotherapy.   On presentation, CT imaging revealed a large pelvic mass and extensive adenopathy. 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 Patient reports healthy at baseline and has not follow-up with any doctor for her life. She is married, G0P0 She denies any fatigue, weight loss, chest pain, cough, shortness of breath, and lower extremity swelling. She noted some urinary symptoms with frequency of urination. She also mentioned that she intermittently pass some mucus in her stool.also some back pain which is new.   INTERVAL HISTORY Patient presents for evaluation prior to cycle 3  chemotherapy. Her appetite is very good and weight is stable.  Urinary symptoms and lower abdominal symptoms have improved a lot.  Rash due to skin toxicities has improved. She takes Dexamethasone for 2 days prior to each chemotherapy cycles to prevent rash. She did get eruption of skin rash after cycle 2 chemotherapy.   Review of Systems  Constitutional: Negative for fever, night sweats,unintentional weight loss, change in appetite. HENT: Negative for ear pain, hearing loss, nasal bleeding Eyes: Negative for eye pain, double vision   Respiratory: Negative for wheezing, shortness of breath, cough Cardiovascular: Negative for chest pain, palpitation.   Gastrointestinal: Negative abdominal pain, diarrhea, nausea vomiting Endocrine: Negative  Genitourinary: her chronic dysuria, frequency has significantly improved.  Skin: Negative for iching, bruising. Skin rash has  improved.  Neurological: Negative for headache, dizziness, seizure Hematological: Negative for easy bruising/bleeding, lymph node enlargement Psychiatric/Behavioral: Negative for depression, anxiety, suicidality  MEDICAL HISTORY: Past Medical History:  Diagnosis Date  . 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 Pelvic mass in female     SURGICAL HISTORY: Past Surgical History:  Procedure Laterality Date  . APPENDECTOMY    . PORTA CATH INSERTION N/A 11/27/2016   Procedure: Glori Luis Cath Insertion;  Surgeon: Algernon Huxley, MD;  Location: [place];  Service: Cardiovascular;  Laterality: N/A;    SOCIAL HISTORY: Social History   Social History  . Marital status: Married    Spouse name: N/A  . Number of children: N/A  . Years of education: N/A   Occupational History  . Not on file.   Social History Main Topics  . Smoking status: Never Smoker  . Smokeless tobacco: Never Used  . Alcohol use Yes     Comment: occasional   . Drug use: No  . Sexual activity: Yes    Birth control/ protection: Post-menopausal   Other Topics Concern  . Not on file   Social History Narrative  . No narrative on file    FAMILY HISTORY Family History  Problem Relation Age of Onset  . Throat cancer Cousin   . Throat cancer Cousin     ALLERGIES:  has No Known Allergies.  MEDICATIONS:  Current Outpatient Prescriptions  Medication Sig Dispense Refill  . dexamethasone (DECADRON) 4 MG tablet Take 5 tablets (20 mg total) by mouth daily. Take 20 mg daily for 2 days prior to your chemotherapy and take 20mg  daily for 2 days after chemotherapy. 20 tablet 2  . hydrocortisone 2.5 % cream Apply topically  2 (two) times daily. 30 g 0  . lidocaine-prilocaine (EMLA) cream Apply 1 application topically as needed. Apply small amount to port site at least 1 hour prior to it being accessed, cover with plastic wrap 30 g 1  . omeprazole (PRILOSEC) 40 MG capsule Take 1 capsule (40 mg total) by mouth  daily. 30 capsule 0  . ondansetron (ZOFRAN) 4 MG tablet Take 1 tablet (4 mg total) by mouth daily as needed for nausea or vomiting. 30 tablet 0  . oxyCODONE (OXY IR/ROXICODONE) 5 MG immediate release tablet Take 1 tablet (5 mg total) by mouth every 4 (four) hours as needed for severe pain. 30 tablet 0  . senna (SENOKOT) 8.6 MG TABS tablet Take 2 tablets (17.2 mg total) by mouth daily. Hold if loose stool. 120 each 0  . Tetrahydrozoline HCl (REDNESS RELIEVER EYE DROPS OP) Apply 1 drop to eye daily as needed (red eye).     Current Facility-Administered Medications  Medication Dose Route Frequency Provider Last Rate Last Dose  . ondansetron (ZOFRAN) injection 4 mg  4 mg Intravenous Once Algernon Huxley, MD       Facility-Administered Medications Ordered in Other Visits  Medication Dose Route Frequency Provider Last Rate Last Dose  . 0.9 %  sodium chloride infusion   Intravenous Once Earlie Server, MD      . CARBOplatin (PARAPLATIN) 420 mg in sodium chloride 0.9 % 250 mL chemo infusion  420 mg Intravenous Once Earlie Server, MD      . dexamethasone (DECADRON) 20 mg in sodium chloride 0.9 % 50 mL IVPB  20 mg Intravenous Once Earlie Server, MD      . diphenhydrAMINE (BENADRYL) injection 50 mg  50 mg Intravenous Once Earlie Server, MD      . famotidine (PEPCID) IVPB 20 mg premix  20 mg Intravenous Once Earlie Server, MD      . PACLitaxel (TAXOL) 276 mg in dextrose 5 % 250 mL chemo infusion (> 80mg /m2)  175 mg/m2 (Treatment Plan Recorded) Intravenous Once Earlie Server, MD      . palonosetron (ALOXI) injection 0.25 mg  0.25 mg Intravenous Once Earlie Server, MD        PHYSICAL EXAMINATION:  ECOG PERFORMANCE STATUS: 0 - Asymptomatic   Vitals:   01/15/17 0917  BP: 112/66  Pulse: 67  Temp: 98.1 F (36.7 C)    Filed Weights   01/15/17 0917  Weight: 112 lb 3.2 oz (50.9 kg)     Physical Exam GENERAL: No distress, well nourished.  HEAD: Normocephalic, No masses, lesions, tenderness or abnormalities  EYES: Conjunctiva are  pink, non icteric ENT: External ears normal ,lips , buccal mucosa, and tongue normal and mucous membranes are moist  LYMPH: No palpable cervical and axillary lymphadenopathy  LUNGS: Clear to auscultation, no crackles or wheezes HEART: Regular rate & rhythm, no murmurs, no gallops, S1 normal and S2 normal  ABDOMEN: Abdomen soft, non-tender, normal bowel sounds, I did not appreciate any  masses or organomegaly  MUSCULOSKELETAL: No CVA tenderness and no tenderness on percussion of the back or rib cage.  EXTREMITIES: No edema, or tenderness NEURO: Alert & oriented, no focal motor/sensory deficits. SKIN: a few scatted maculopapular rash on both lower extremities, have significantly improved.     SKIN: LABORATORY DATA: I have personally reviewed the data as listed: CBC    Component Value Date/Time   WBC 7.0 01/15/2017 0905   RBC 3.52 (L) 01/15/2017 0905   HGB 10.3 (L) 01/15/2017 8413  HCT 30.1 (L) 01/15/2017 0905   PLT 237 01/15/2017 0905   MCV 85.4 01/15/2017 0905   MCH 29.3 01/15/2017 0905   MCHC 34.3 01/15/2017 0905   RDW 18.5 (H) 01/15/2017 0905   LYMPHSABS 0.7 (L) 01/15/2017 0905   MONOABS 0.8 01/15/2017 0905   EOSABS 0.0 01/15/2017 0905   BASOSABS 0.0 01/15/2017 0905   CMP Latest Ref Rng & Units 01/15/2017 12/25/2016 12/19/2016  Glucose 65 - 99 mg/dL 110(H) 119(H) 74  BUN 6 - 20 mg/dL 18 20 12   Creatinine 0.44 - 1.00 mg/dL 0.53 0.52 0.56  Sodium 135 - 145 mmol/L 131(L) 131(L) 133(L)  Potassium 3.5 - 5.1 mmol/L 3.8 4.1 3.7  Chloride 101 - 111 mmol/L 101 100(L) 103  CO2 22 - 32 mmol/L 25 23 26   Calcium 8.9 - 10.3 mg/dL 9.9 9.7 9.4  Total Protein 6.5 - 8.1 g/dL 7.2 7.6 7.2  Total Bilirubin 0.3 - 1.2 mg/dL 0.4 0.4 0.3  Alkaline Phos 38 - 126 U/L 71 74 74  AST 15 - 41 U/L 18 16 20   ALT 14 - 54 U/L 11(L) 11(L) 11(L)    pathology 11/16/2016 Surgical Pathology  CASE: [id number]  PATIENT: Donnabelle Woodrow  Surgical Pathology Report   SPECIMEN SUBMITTED:  A. Retroperitoneal  adenopathy, left  DIAGNOSIS:  A. LYMPH NODE, LEFT RETROPERITONEAL; CT-GUIDED CORE BIOPSY:  - METASTATIC HIGH-GRADE SEROUS CARCINOMA.   Comment:   RADIOGRAPHIC STUDIES: I have personally reviewed the radiological images as listed and agree with the findings in the report CT chest abdomen pelvis with contrast 11/13/2016 IMPRESSION: 1. Large heterogeneous central pelvic mass measures up to 12.6 cm. The lesion generates substantial mass-effect on the uterus, bladder, and pelvic sidewall anatomy. The mass appears to invade the mid sigmoid colon. Etiology of the central pelvic mass is not definitive by CT, but ovarian primary is suspected. The lesion becomes indistinguishable from the posterior uterus on some images and uterine origin is possible, but the uterus does not appear to be the epicenter of the mass and appears to be more displaced by it. MRI of the pelvis without and with contrast may prove helpful to better delineate the relationship of the uterus to the central pelvic mass although it may not be able to definitively localize the origin. 2. Bulky retroperitoneal and left pelvic sidewall lymphadenopathy. The retroperitoneal lymphadenopathy generates substantial mass-effect on the IVC and left renal vein. The external iliac veins along each pelvic sidewall are markedly attenuated by the mass/lymphadenopathy. Patency of these vessels cannot be definitely confirmed on this exam.   ASSESSMENT/PLAN Cancer Staging High grade ovarian cancer [facility]) Staging form: Ovary, Fallopian Tube, and Primary Peritoneal Carcinoma, AJCC 8th Edition - Clinical stage from 11/22/2016: Stage IIIC (cT3c, cN1b, cM0) - Signed by Earlie Server, MD on 11/22/2016  1. High grade ovarian cancer ([place])   2. Encounter for antineoplastic chemotherapy   3. Neoplasm related pain   4. Hyponatremia   5. Rash/skin eruption    # OK to proceed to cycle 3 carbo and taxol.   # Chemotherapy related skin toxicity, likely from Taxol.  Continue Dexamethason 20mg   For two days prior and two days after chemotherapy to prevent skin toxicities.  # Hyponatremia with low plasma osmolarity and low urine osmalarity, due to SIADH. Today's sodium is stable # Continue oxycodone 5mg  Q4-6 hours as needed. Patient reports pain has become better, she only need pain medication occationally. # CT after 3-4 cycles. Will discuss with GYN for follow up [place] with Dr.Seachord for  surgery plan.   Follow-up on 3 weeks on Day 1 of cycle 3 Carbo Taxol w cbc, cmp, CA 125. My colleague Dr.Bramanday will see her. Patient is aware.   All questions were answered. The patient knows to call the clinic with any problems, questions or concerns.  Addendum: discussed with Dr.Seachord. Will proceed with CT scan after 3 cycles of chemotherapy. And plan possible debulking surgery after 4 cycles.  Earlie Server, MD  01/15/2017 9:52 AM

## 2017-01-16 LAB — CA 125: Cancer Antigen (CA) 125: 207.6 U/mL — ABNORMAL HIGH (ref 0.0–38.1)

## 2017-01-17 NOTE — Addendum Note (Signed)
Addended by: Earlie Server on: 01/17/2017 08:40 AM   Modules accepted: Orders

## 2017-01-24 ENCOUNTER — Ambulatory Visit
Admission: RE | Admit: 2017-01-24 | Discharge: 2017-01-24 | Disposition: A | Discharge status: Home / Self Care | Source: Ambulatory Visit | Attending: Oncology | Admitting: Oncology

## 2017-01-24 ENCOUNTER — Inpatient Hospital Stay (HOSPITAL_BASED_OUTPATIENT_CLINIC_OR_DEPARTMENT_OTHER): Admitting: Obstetrics and Gynecology

## 2017-01-24 VITALS — BP 116/76 | HR 79 | Temp 98.4°F | Resp 12 | Ht 68.0 in | Wt 117.6 lb

## 2017-01-24 DIAGNOSIS — E871 Hypo-osmolality and hyponatremia: Secondary | ICD-10-CM

## 2017-01-24 DIAGNOSIS — Z5111 Encounter for antineoplastic chemotherapy: Secondary | ICD-10-CM | POA: Diagnosis not present

## 2017-01-24 DIAGNOSIS — Z79899 Other long term (current) drug therapy: Secondary | ICD-10-CM

## 2017-01-24 DIAGNOSIS — Z791 Long term (current) use of non-steroidal anti-inflammatories (NSAID): Secondary | ICD-10-CM

## 2017-01-24 DIAGNOSIS — G893 Neoplasm related pain (acute) (chronic): Secondary | ICD-10-CM

## 2017-01-24 DIAGNOSIS — C569 Malignant neoplasm of unspecified ovary: Secondary | ICD-10-CM

## 2017-01-24 DIAGNOSIS — R590 Localized enlarged lymph nodes: Secondary | ICD-10-CM | POA: Insufficient documentation

## 2017-01-24 DIAGNOSIS — R1900 Intra-abdominal and pelvic swelling, mass and lump, unspecified site: Secondary | ICD-10-CM | POA: Diagnosis not present

## 2017-01-24 DIAGNOSIS — R599 Enlarged lymph nodes, unspecified: Secondary | ICD-10-CM | POA: Diagnosis not present

## 2017-01-24 DIAGNOSIS — R21 Rash and other nonspecific skin eruption: Secondary | ICD-10-CM

## 2017-01-24 MED ORDER — IOPAMIDOL (ISOVUE-300) INJECTION 61%
85.0000 mL | Freq: Once | INTRAVENOUS | Status: AC | PRN
  Administered 2017-01-24: 85 mL via INTRAVENOUS

## 2017-01-24 NOTE — Progress Notes (Signed)
Patient here for initial visit. Patient states she feels bloated, denies abdominal pain.

## 2017-01-24 NOTE — Progress Notes (Signed)
  Oncology Nurse Navigator Documentation Chaperoned exam. Appointment arranged to see Dr. Theora Gianotti, Nov 21st following fourth cycle. Navigator Location: [facility] (01/24/17 1500)   )Navigator Encounter Type: Follow-up Appt (01/24/17 1500)                     Patient Visit Type: GynOnc (01/24/17 1500)                              Time Spent with Patient: 15 (01/24/17 1500)

## 2017-01-24 NOTE — Progress Notes (Signed)
Gynecologic Oncology Consult Visit   Referring Provider: Dr. 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  Subjective:  Katie Hill is a 66 y.o. G0P0 female with advanced ovarian cancer.  Patient had cycle 3 of neoadjuvant chemotherapy 01/15/17 at [facility] with Dr. Tasia Catchings. Tolerating chemo well. CA125 has fallen from 4,382 to 207 (10/8).  CT scan shows dramatic response as well.  CT scan 10/14 Vascular/Lymphatic: Normal appearance of the abdominal aorta. Interval decrease in size of previously identified bulky retroperitoneal adenopathy. Index aortocaval node measures 1.6 x 2.6 cm, image 32 of series 2. Previously 4.4 x 5.3 cm. Index left periaortic nodal mass measures 2.2 x 1.3 cm, image 31 of series 2. Previously 3.7 x 4.7 cm. At the level of the bifurcation there is a low-attenuation nodal mass which measure 3.1 x 2.6 cm, image 44 of series 2. Previously 4.3 x 4.8 cm. Left common iliac node measures 1.2 cm, image 53 of series 2. Previously 2.2 cm. Left external iliac node measures 1.6 x 1.1 cm, image 67 of series 2. Previously 4.8 x 2.6 cm.  Reproductive: Large mass centered around the uterus measures 9.4 x 5.5 cm, image 67 of series 2. Previously this measured 12.6 x 9.2 cm.  Oncology history She is a usually healthy patient who presented to the ER 11/13/2016 with symptoms of right lower quadrant pain for 3-4 weeks, associated with nausea, bloating, no change in weight, urinary frequency, and change in bowel habits with more diffucult and stringy BM's.  CT CHEST, ABDOMEN, AND PELVIS WITH CONTRAST IMPRESSION: 1. Large heterogeneous central pelvic mass measures up to 12.6 cm. The lesion generates substantial mass-effect on the uterus, bladder, and pelvic sidewall anatomy. The mass appears to invade the mid sigmoid colon. Etiology of the central pelvic mass is not definitive by CT, but ovarian primary is suspected. The lesion  becomes indistinguishable from the posterior uterus on some images and uterine origin is possible, but the uterus does not appear to be the epicenter of the mass and appears to be more displaced by it. MRI of the pelvis without and with contrast may prove helpful to better delineate the relationship of the uterus to the central pelvic mass although it may not be able to definitively localize the origin. 2. Bulky retroperitoneal and left pelvic sidewall lymphadenopathy. The retroperitoneal lymphadenopathy generates substantial mass-effect on the IVC and left renal vein. The external iliac veins along each pelvic sidewall are markedly attenuated by the mass/lymphadenopathy. Patency of these vessels cannot be definitely confirmed on this exam.  11/16/16 LYMPH NODE, LEFT RETROPERITONEAL; CT-GUIDED CORE BIOPSY:  - METASTATIC HIGH-GRADE SEROUS CARCINOMA.   Decision was made to do neoadjuvant carbo/taxol chemotherapy.  She does not have regular medical cancer and has not has screening mammogram, colonoscopy, or Pap smears. She has not had an abnormal Pap.   Problem List: Patient Active Problem List   Diagnosis Date Noted  . Hyponatremia 12/12/2016  . Dehydration 12/08/2016  . High grade ovarian cancer ([place]) 11/20/2016  . Pelvic mass 11/15/2016    Past Medical History: Past Medical History:  Diagnosis Date  . High grade ovarian cancer ([place]) 11/20/2016  . Pelvic mass in female     Past Surgical History: Past Surgical History:  Procedure Laterality Date  . APPENDECTOMY    . PORTA CATH INSERTION N/A 11/27/2016   Procedure: Katie Hill Cath Insertion;  Surgeon: Algernon Huxley, MD;  Location: [facility];  Service: Cardiovascular;  Laterality: N/A;  Past Gynecologic History:  Menarche: 17 Last Menstrual Period: 24 years ago History of Abnormal pap: no Last pap: years ago   OB History:  OB History  Gravida Para Term Preterm AB Living  0 0 0 0 0 0  SAB TAB Ectopic Multiple  Live Births  0 0 0 0 0        Family History: Family History  Problem Relation Age of Onset  . Throat cancer Cousin   . Throat cancer Cousin     Social History: Social History   Social History  . Marital status: Married    Spouse name: N/A  . Number of children: N/A  . Years of education: N/A   Occupational History  . Not on file.   Social History Main Topics  . Smoking status: Never Smoker  . Smokeless tobacco: Never Used  . Alcohol use Yes     Comment: occasional   . Drug use: No  . Sexual activity: Yes    Birth control/ protection: Post-menopausal   Other Topics Concern  . Not on file   Social History Narrative  . No narrative on file    Allergies: No Known Allergies  Current Medications: Current Outpatient Prescriptions  Medication Sig Dispense Refill  . dexamethasone (DECADRON) 4 MG tablet Take 5 tablets (20 mg total) by mouth daily. Take 20 mg daily for 2 days prior to your chemotherapy and take 37m daily for 2 days after chemotherapy. 20 tablet 2  . hydrocortisone 2.5 % cream Apply topically 2 (two) times daily. 30 g 0  . lidocaine-prilocaine (EMLA) cream Apply 1 application topically as needed. Apply small amount to port site at least 1 hour prior to it being accessed, cover with plastic wrap 30 g 1  . meloxicam (MOBIC) 7.5 MG tablet Take 7.5 mg by mouth daily.    .Marland Kitchenomeprazole (PRILOSEC) 40 MG capsule Take 1 capsule (40 mg total) by mouth daily. 30 capsule 0  . ondansetron (ZOFRAN) 4 MG tablet Take 1 tablet (4 mg total) by mouth daily as needed for nausea or vomiting. 30 tablet 0  . oxyCODONE (OXY IR/ROXICODONE) 5 MG immediate release tablet Take 1 tablet (5 mg total) by mouth every 4 (four) hours as needed for severe pain. 30 tablet 0  . senna (SENOKOT) 8.6 MG TABS tablet Take 2 tablets (17.2 mg total) by mouth daily. Hold if loose stool. 120 each 0  . Tetrahydrozoline HCl (REDNESS RELIEVER EYE DROPS OP) Apply 1 drop to eye daily as needed (red eye).      Current Facility-Administered Medications  Medication Dose Route Frequency Provider Last Rate Last Dose  . ondansetron (ZOFRAN) injection 4 mg  4 mg Intravenous Once DAlgernon Huxley MD        Review of Systems General: no complaints  HEENT: no complaints  Lungs: no complaints  Cardiac: no complaints  GI: constipation; RLQ pelvic pain  GU: none  Musculoskeletal: back pain  Extremities: no complaints  Skin: no complaints  Neuro: no complaints  Endocrine: no complaints  Psych: no complaints     Objective:  Physical Examination:  BP 116/76 (BP Location: Left Arm, Patient Position: Sitting)   Pulse 79   Temp 98.4 F (36.9 C) (Oral)   Resp 12   Ht '5\' 8"'  (1.727 m)   Wt 117 lb 9.6 oz (53.3 kg)   BMI 17.88 kg/m    ECOG Performance Status: 1 - Symptomatic but completely ambulatory  General appearance: alert, cooperative and appears stated  age HEENT:PERRLA, extra ocular movement intact and sclera clear, anicteric Lymph node survey: non-palpable, axillary, inguinal, supraclavicular Cardiovascular: regular rate and rhythm Respiratory: normal air entry, lungs clear to auscultation Breast exam: breasts appear normal, no suspicious masses, no skin or nipple changes or axillary nodes. Abdomen: thin and soft with no masses or ascites Back: inspection of back is normal Extremities: extremities normal, atraumatic, no cyanosis or edema Skin exam - normal coloration and turgor, no rashes, no suspicious skin lesions noted. Neurological exam reveals alert, oriented, normal speech, no focal findings or movement disorder noted.  Pelvic: exam chaperoned by nurse;  Vulva: normal appearing vulva with no masses, tenderness or lesions; Vagina: normal vagina; Adnexa: tenderness right with persistent mass but only about 8 cm now; Uterus: small and mobile; Cervix: no lesions; Rectal: confirms.    Assessment:  Marlise Fahr is a 66 y.o. female diagnosed with high grade serous ovarian cancer  on CT directed node biopsy with partial bowel obstruction and extensive bulky adenopathy 8/18. Now s/p 3 cycles of carbo/taxol chemotherapy with dramatic decline in CA125 and improvement on CT scan.    Medical co-morbidities complicating care: prior abdominal surgery.  Plan:   Problem List Items Addressed This Visit      Genitourinary   High grade ovarian cancer ([place]) - Primary     Discussed with patient and husband that she has had a dramatic response to treatment.  Still has residual right adnexal mass and lymph nodes are much smaller,but still enlarged.  In view of persistent adenopathy recommend an additional cycle of chemotherapy prior to surgery.  Perhaps this would make a laparoscopic approach to optimal debulking more feasible. She will have cycle 4 on 10/19 and return to see Dr Theora Gianotti to schedule surgery on 11/21.  She understands that she will need an additional 3 cycles of chemotherapy after surgery.  Needs Genetic testing for BRCA1/2 and other HR pathway genes.   Mellody Drown, MD  CC:  Gae Dry,  [address] [place], [place] [address]

## 2017-02-05 ENCOUNTER — Inpatient Hospital Stay (HOSPITAL_BASED_OUTPATIENT_CLINIC_OR_DEPARTMENT_OTHER): Admitting: Internal Medicine

## 2017-02-05 ENCOUNTER — Inpatient Hospital Stay

## 2017-02-05 ENCOUNTER — Telehealth: Payer: Self-pay

## 2017-02-05 VITALS — BP 113/68 | HR 65

## 2017-02-05 VITALS — BP 116/70 | HR 76 | Temp 97.9°F | Wt 115.0 lb

## 2017-02-05 DIAGNOSIS — C569 Malignant neoplasm of unspecified ovary: Secondary | ICD-10-CM

## 2017-02-05 DIAGNOSIS — E871 Hypo-osmolality and hyponatremia: Secondary | ICD-10-CM

## 2017-02-05 DIAGNOSIS — R21 Rash and other nonspecific skin eruption: Secondary | ICD-10-CM

## 2017-02-05 DIAGNOSIS — D649 Anemia, unspecified: Secondary | ICD-10-CM

## 2017-02-05 DIAGNOSIS — E860 Dehydration: Secondary | ICD-10-CM

## 2017-02-05 DIAGNOSIS — R599 Enlarged lymph nodes, unspecified: Secondary | ICD-10-CM | POA: Diagnosis not present

## 2017-02-05 DIAGNOSIS — G893 Neoplasm related pain (acute) (chronic): Secondary | ICD-10-CM

## 2017-02-05 DIAGNOSIS — Z5111 Encounter for antineoplastic chemotherapy: Secondary | ICD-10-CM | POA: Diagnosis not present

## 2017-02-05 DIAGNOSIS — Z79899 Other long term (current) drug therapy: Secondary | ICD-10-CM | POA: Diagnosis not present

## 2017-02-05 DIAGNOSIS — R1900 Intra-abdominal and pelvic swelling, mass and lump, unspecified site: Secondary | ICD-10-CM

## 2017-02-05 LAB — CBC WITH DIFFERENTIAL/PLATELET
BASOS PCT: 0 %
Basophils Absolute: 0 10*3/uL (ref 0–0.1)
EOS ABS: 0 10*3/uL (ref 0–0.7)
Eosinophils Relative: 0 %
HCT: 28.9 % — ABNORMAL LOW (ref 35.0–47.0)
HEMOGLOBIN: 10.1 g/dL — AB (ref 12.0–16.0)
LYMPHS ABS: 1.1 10*3/uL (ref 1.0–3.6)
Lymphocytes Relative: 9 %
MCH: 30.4 pg (ref 26.0–34.0)
MCHC: 35 g/dL (ref 32.0–36.0)
MCV: 86.9 fL (ref 80.0–100.0)
MONO ABS: 0.8 10*3/uL (ref 0.2–0.9)
MONOS PCT: 7 %
Neutro Abs: 10.3 10*3/uL — ABNORMAL HIGH (ref 1.4–6.5)
Neutrophils Relative %: 84 %
Platelets: 189 10*3/uL (ref 150–440)
RBC: 3.32 MIL/uL — ABNORMAL LOW (ref 3.80–5.20)
RDW: 20 % — AB (ref 11.5–14.5)
WBC: 12.2 10*3/uL — ABNORMAL HIGH (ref 3.6–11.0)

## 2017-02-05 LAB — COMPREHENSIVE METABOLIC PANEL
ALBUMIN: 4 g/dL (ref 3.5–5.0)
ALT: 12 U/L — ABNORMAL LOW (ref 14–54)
AST: 20 U/L (ref 15–41)
Alkaline Phosphatase: 65 U/L (ref 38–126)
Anion gap: 9 (ref 5–15)
BUN: 16 mg/dL (ref 6–20)
CHLORIDE: 101 mmol/L (ref 101–111)
CO2: 22 mmol/L (ref 22–32)
Calcium: 9.9 mg/dL (ref 8.9–10.3)
Creatinine, Ser: 0.53 mg/dL (ref 0.44–1.00)
GFR calc Af Amer: >60 mL/min (ref >60)
Glucose, Bld: 134 mg/dL — ABNORMAL HIGH (ref 65–99)
POTASSIUM: 3.8 mmol/L (ref 3.5–5.1)
SODIUM: 132 mmol/L — AB (ref 135–145)
Total Bilirubin: 0.5 mg/dL (ref 0.3–1.2)
Total Protein: 7.1 g/dL (ref 6.5–8.1)

## 2017-02-05 MED ORDER — SODIUM CHLORIDE 0.9 % IV SOLN
417.5000 mg | Freq: Once | INTRAVENOUS | Status: AC
  Administered 2017-02-05: 420 mg via INTRAVENOUS
  Filled 2017-02-05: qty 42

## 2017-02-05 MED ORDER — FAMOTIDINE IN NACL 20-0.9 MG/50ML-% IV SOLN
20.0000 mg | Freq: Once | INTRAVENOUS | Status: AC
  Administered 2017-02-05: 20 mg via INTRAVENOUS
  Filled 2017-02-05: qty 50

## 2017-02-05 MED ORDER — SODIUM CHLORIDE 0.9 % IV SOLN
Freq: Once | INTRAVENOUS | Status: AC
  Administered 2017-02-05: 11:00:00 via INTRAVENOUS
  Filled 2017-02-05: qty 1000

## 2017-02-05 MED ORDER — HEPARIN SOD (PORK) LOCK FLUSH 100 UNIT/ML IV SOLN
500.0000 [IU] | Freq: Once | INTRAVENOUS | Status: AC
  Administered 2017-02-05: 500 [IU] via INTRAVENOUS
  Filled 2017-02-05: qty 5

## 2017-02-05 MED ORDER — SENNA 8.6 MG PO TABS: 2.0000 | ORAL_TABLET | Freq: Every day | ORAL | 0 refills | Status: DC

## 2017-02-05 MED ORDER — PACLITAXEL CHEMO INJECTION 300 MG/50ML
175.0000 mg/m2 | Freq: Once | INTRAVENOUS | Status: AC
  Administered 2017-02-05: 276 mg via INTRAVENOUS
  Filled 2017-02-05: qty 46

## 2017-02-05 MED ORDER — HYDROCORTISONE 2.5 % EX CREA: TOPICAL_CREAM | Freq: Two times a day (BID) | CUTANEOUS | 0 refills | Status: DC

## 2017-02-05 MED ORDER — SODIUM CHLORIDE 0.9% FLUSH
10.0000 mL | INTRAVENOUS | Status: DC | PRN
  Administered 2017-02-05: 10 mL via INTRAVENOUS
  Filled 2017-02-05: qty 10

## 2017-02-05 MED ORDER — PALONOSETRON HCL INJECTION 0.25 MG/5ML
0.2500 mg | Freq: Once | INTRAVENOUS | Status: AC
  Administered 2017-02-05: 0.25 mg via INTRAVENOUS
  Filled 2017-02-05: qty 5

## 2017-02-05 MED ORDER — DIPHENHYDRAMINE HCL 50 MG/ML IJ SOLN
50.0000 mg | Freq: Once | INTRAMUSCULAR | Status: AC
  Administered 2017-02-05: 50 mg via INTRAVENOUS
  Filled 2017-02-05: qty 1

## 2017-02-05 MED ORDER — SODIUM CHLORIDE 0.9 % IV SOLN
20.0000 mg | Freq: Once | INTRAVENOUS | Status: AC
  Administered 2017-02-05: 20 mg via INTRAVENOUS
  Filled 2017-02-05: qty 2

## 2017-02-05 NOTE — Progress Notes (Signed)
Patient here today for follow up.   

## 2017-02-05 NOTE — Telephone Encounter (Signed)
  Oncology Nurse Navigator Documentation Katie Hill is receiving cycle 4 of chemotherapy today. She is scheduled to see Dr. Theora Gianotti 11/21 per Dr. Fransisca Connors to arrange for surgery. I met withy Katie Hill in chemo infusion and we went over what would happen at 11/21 appt. We have cancelled cycle 5 appointment in preparation for surgery. Navigator Location: [facility] (02/05/17 1400)   )Navigator Encounter Type: Other;Genetics (02/05/17 1400)                       Treatment Phase:  (cycle 4) (02/05/17 1400)                            Time Spent with Patient: 15 (02/05/17 1400)

## 2017-02-05 NOTE — Assessment & Plan Note (Addendum)
#   High-grade serous ovarian cancer-stage III C- undergoing neoadjuvant chemotherapy-carbotaxol currently status post 3 cycles with excellent response after 3 cycles.  #  Plan is to proceed with fourth cycle of chemotherapy today; proceed with definitive surgery after cycle #4. Labs today reviewed;  acceptable for treatment today.   # Skin rash- secondary chemotherapy on steroids-improved.  # genetic testing- discussed the implications- for the patient herself/which would include therapeutic options; and also for the family. Discussed the pros and cons of genetic testing. She agrees. We'll plan to have it done at the next visit/ approximately 3 weeks.  # follow up Dr.Yu/labs- ca-125/ 3 weeks-No chemo. Will confirm re: timing of the surgery with gyn-onc.

## 2017-02-05 NOTE — Progress Notes (Signed)
[facility] OFFICE PROGRESS NOTE  Patient Care Team: Patient, [name], RN as Registered Nurse  Cancer Staging High grade ovarian cancer [facility] Staging form: Ovary, Fallopian Tube, and Primary Peritoneal Carcinoma, AJCC 8th Edition - Clinical stage from 11/22/2016: Stage IIIC (cT3c, cN1b, cM0) - Signed by Earlie Server, MD on 11/22/2016     No history exists.    This is my first interaction with the patient as patient's primary oncologist has been Dr.Yu. I reviewed the patient's prior charts/pertinent labs/imaging in detail.   INTERVAL HISTORY:  Katie Hill 66 y.o.  female  diagnosed with high grade serous ovarian cancer on CT directed node biopsy with partial bowel obstruction and extensive bulky adenopathy 8/18. Now s/p 3 cycles of carbo/taxol chemotherapy with dramatic decline in CA125 and improvement on CT scan. Patient is here to proceed with cycle #4 of carbotaxol chemotherapy.   Patient admits to mild skin rash after chemotherapy- which is improved since taking the steroids. Otherwise denies any significant tingling or numbness. Denies any significant nausea or vomiting. Appetite is good. No abdominal distention or swelling in the legs.  REVIEW OF SYSTEMS:  A complete 10 point review of system is done which is negative except mentioned above/history of present illness.   PAST MEDICAL HISTORY :  Past Medical History:  Diagnosis Date  . High grade ovarian cancer ([place]) 11/20/2016  . Pelvic mass in female     PAST SURGICAL HISTORY :   Past Surgical History:  Procedure Laterality Date  . APPENDECTOMY    . PORTA CATH INSERTION N/A 11/27/2016   Procedure: Glori Luis Cath Insertion;  Surgeon: Algernon Huxley, MD;  Location: [facility];  Service: Cardiovascular;  Laterality: N/A;    FAMILY HISTORY :   Family History  Problem Relation Age of Onset  . Throat cancer Cousin   . Throat cancer Cousin      SOCIAL HISTORY:   Social History  Substance Use Topics  . Smoking status: Never Smoker  . Smokeless tobacco: Never Used  . Alcohol use Yes     Comment: occasional     ALLERGIES:  has No Known Allergies.  MEDICATIONS:  Current Outpatient Prescriptions  Medication Sig Dispense Refill  . Calcium Carbonate Antacid (TUMS E-X PO) Take by mouth.    . dexamethasone (DECADRON) 4 MG tablet Take 5 tablets (20 mg total) by mouth daily. Take 20 mg daily for 2 days prior to your chemotherapy and take 20mg  daily for 2 days after chemotherapy. 20 tablet 2  . hydrocortisone 2.5 % cream Apply topically 2 (two) times daily. 30 g 0  . lidocaine-prilocaine (EMLA) cream Apply 1 application topically as needed. Apply small amount to port site at least 1 hour prior to it being accessed, cover with plastic wrap 30 g 1  . ondansetron (ZOFRAN) 4 MG tablet Take 1 tablet (4 mg total) by mouth daily as needed for nausea or vomiting. 30 tablet 0  . oxyCODONE (OXY IR/ROXICODONE) 5 MG immediate release tablet Take 1 tablet (5 mg total) by mouth every 4 (four) hours as needed for severe pain. 30 tablet 0  . senna (SENOKOT) 8.6 MG TABS tablet Take 2 tablets (17.2 mg total) by mouth daily. Hold if loose stool. 120 each 0  . Tetrahydrozoline HCl (REDNESS RELIEVER EYE DROPS OP) Apply 1 drop to eye daily as needed (red eye).     Current Facility-Administered Medications  Medication  Dose Route Frequency Provider Last Rate Last Dose  . ondansetron (ZOFRAN) injection 4 mg  4 mg Intravenous Once Algernon Huxley, MD       Facility-Administered Medications Ordered in Other Visits  Medication Dose Route Frequency Provider Last Rate Last Dose  . CARBOplatin (PARAPLATIN) 420 mg in sodium chloride 0.9 % 250 mL chemo infusion  420 mg Intravenous Once Charlaine Dalton R, MD      . heparin lock flush 100 unit/mL  500 Units Intravenous Once Earlie Server, MD      . PACLitaxel (TAXOL) 276 mg in dextrose 5 % 250 mL chemo infusion (>  80mg /m2)  175 mg/m2 (Treatment Plan Recorded) Intravenous Once Cammie Sickle, MD 99 mL/hr at 02/05/17 1146 276 mg at 02/05/17 1146  . sodium chloride flush (NS) 0.9 % injection 10 mL  10 mL Intravenous PRN Earlie Server, MD   10 mL at 02/05/17 0838    PHYSICAL EXAMINATION: ECOG PERFORMANCE STATUS: 0 - Asymptomatic  BP 116/70 (BP Location: Left Arm, Patient Position: Sitting)   Pulse 76   Temp 97.9 F (36.6 C) (Tympanic)   Wt 115 lb (52.2 kg)   BMI 17.49 kg/m   Filed Weights   02/05/17 0903  Weight: 115 lb (52.2 kg)    GENERAL: Well-nourished well-developed; Alert, no distress and comfortable.   With family. EYES: no pallor or icterus OROPHARYNX: no thrush or ulceration; good dentition  NECK: supple, no masses felt LYMPH:  no palpable lymphadenopathy in the cervical, axillary or inguinal regions LUNGS: clear to auscultation and  No wheeze or crackles HEART/CVS: regular rate & rhythm and no murmurs; No lower extremity edema ABDOMEN:abdomen soft, non-tender and normal bowel sounds Musculoskeletal:no cyanosis of digits and no clubbing  PSYCH: alert & oriented x 3 with fluent speech NEURO: no focal motor/sensory deficits SKIN:  no rashes or significant lesions  LABORATORY DATA:  I have reviewed the data as listed    Component Value Date/Time   NA 132 (L) 02/05/2017 0840   K 3.8 02/05/2017 0840   CL 101 02/05/2017 0840   CO2 22 02/05/2017 0840   GLUCOSE 134 (H) 02/05/2017 0840   BUN 16 02/05/2017 0840   CREATININE 0.53 02/05/2017 0840   CALCIUM 9.9 02/05/2017 0840   PROT 7.1 02/05/2017 0840   ALBUMIN 4.0 02/05/2017 0840   AST 20 02/05/2017 0840   ALT 12 (L) 02/05/2017 0840   ALKPHOS 65 02/05/2017 0840   BILITOT 0.5 02/05/2017 0840   GFRNONAA >60 02/05/2017 0840   GFRAA >60 02/05/2017 0840    No results found for: SPEP, UPEP  Lab Results  Component Value Date   WBC 12.2 (H) 02/05/2017   NEUTROABS 10.3 (H) 02/05/2017   HGB 10.1 (L) 02/05/2017   HCT 28.9 (L)  02/05/2017   MCV 86.9 02/05/2017   PLT 189 02/05/2017      Chemistry      Component Value Date/Time   NA 132 (L) 02/05/2017 0840   K 3.8 02/05/2017 0840   CL 101 02/05/2017 0840   CO2 22 02/05/2017 0840   BUN 16 02/05/2017 0840   CREATININE 0.53 02/05/2017 0840      Component Value Date/Time   CALCIUM 9.9 02/05/2017 0840   ALKPHOS 65 02/05/2017 0840   AST 20 02/05/2017 0840   ALT 12 (L) 02/05/2017 0840   BILITOT 0.5 02/05/2017 0840       RADIOGRAPHIC STUDIES: I have personally reviewed the radiological images as listed and agreed with the findings in  the report. No results found.   ASSESSMENT & PLAN:  High grade ovarian cancer ([place]) # High-grade serous ovarian cancer-stage III C- undergoing neoadjuvant chemotherapy-carbotaxol currently status post 3 cycles with excellent response after 3 cycles.  #  Plan is to proceed with fourth cycle of chemotherapy today; proceed with definitive surgery after cycle #4. Labs today reviewed;  acceptable for treatment today.   # Skin rash- secondary chemotherapy on steroids-improved.  # genetic testing- discussed the implications- for the patient herself/which would include therapeutic options; and also for the family. Discussed the pros and cons of genetic testing. She agrees. We'll plan to have it done at the next visit/ approximately 3 weeks.  # follow up Dr.Yu/labs- ca-125/ 3 weeks-No chemo. Will confirm re: timing of the surgery with gyn-onc.    No orders of the defined types were placed in this encounter.  All questions were answered. The patient knows to call the clinic with any problems, questions or concerns.      Cammie Sickle, MD 02/05/2017 1:23 PM

## 2017-02-06 ENCOUNTER — Telehealth: Payer: Self-pay

## 2017-02-06 LAB — CA 125: Cancer Antigen (CA) 125: 64.6 U/mL — ABNORMAL HIGH (ref 0.0–38.1)

## 2017-02-06 NOTE — Telephone Encounter (Signed)
  Oncology Nurse Navigator Documentation Notified Katie Hill of CA 125 results.  CA 125  Order: [id number]  Status:  Final result   Visible to patient:  No (Not Released) Next appt:  02/28/2017 at 11:30 AM in Oncology ([facility]) Dx:  High grade ovarian cancer ([place])      Ref Range & Units 1d ago  3wk ago  34mo ago  11mo ago    Cancer Antigen (CA) 125 0.0 - 38.1 U/mL 64.6   207.6CM   699.1CM   4,382.0CM    Comment: (NOTE)            Navigator Location: [facility] (02/06/17 0900)   )Navigator Encounter Type: Telephone;Diagnostic Results (02/06/17 0900)                                                    Time Spent with Patient: 15 (02/06/17 0900)

## 2017-02-10 ENCOUNTER — Other Ambulatory Visit: Payer: Self-pay | Admitting: Oncology

## 2017-02-12 ENCOUNTER — Telehealth: Payer: Self-pay | Admitting: Genetic Counselor

## 2017-02-12 NOTE — Telephone Encounter (Signed)
Patient referred by Dr. Tasia Catchings for genetic counseling via telegenetics visit. Left message for patient to call back to get scheduled.  Steele Berg, [place], [place] Genetic Counselor Phone: [phone]

## 2017-02-16 ENCOUNTER — Telehealth: Payer: Self-pay | Admitting: Genetic Counselor

## 2017-02-16 NOTE — Telephone Encounter (Signed)
2nd message left for patient to schedule genetic counseling phone appt. Closing case.   Dr. Tasia Catchings, Please contact the patient and re-refer if you'd like for her to have genetic counseling.  Steele Berg, [place], [place] Genetic Counselor Phone: [phone]

## 2017-02-20 ENCOUNTER — Telehealth: Payer: Self-pay | Admitting: Genetic Counselor

## 2017-02-20 NOTE — Telephone Encounter (Signed)
Cancer Genetics            Telegenetics Initial Visit    Patient Name: Katie Hill Patient DOB: 1950/06/17 Patient Age: 66 y.o. Phone Call Date: 02/20/2017  Referring Provider: Earlie Server, MD  Reason for Visit: Evaluate for hereditary susceptibility to cancer    Assessment and Plan:  . Ms. Katie Hill's history is not suggestive of a hereditary predisposition to cancer given the numerous unaffected family members on both maternal and paternal sides. However, she meets NCCN criteria for genetic testing due to her diagnosis of ovarian cancer as it may have significant implications for treatment.  . Testing is recommended to determine whether she has a pathogenic mutation that will impact her future treatment as well as screening and risk-reduction for future cancer. A negative result will be reassuring.  . Ms. Katie Hill wished to pursue genetic testing, but her blood draw will be deferred until the beginning on December when she will have Medicare. At that time, cost of testing will be covered. Analysis will include the 83 genes on [facility]'s Multi-Cancer panel (ALK, APC, ATM, AXIN2, BAP1, BARD1, BLM, BMPR1A, BRCA1, BRCA2, BRIP1, CASR, CDC73, CDH1, CDK4, CDKN1B, CDKN1C, CDKN2A, CEBPA, CHEK2, CTNNA1, DICER1, DIS3L2, EGFR, EPCAM, FH, FLCN, GATA2, GPC3, GREM1, HOXB13, HRAS, KIT, MAX, MEN1, MET, MITF, MLH1, MSH2, MSH3, MSH6, MUTYH, NBN, NF1, NF2, NTHL1, PALB2, PDGFRA, PHOX2B, PMS2, POLD1, POLE, POT1, PRKAR1A, PTCH1, PTEN, RAD50, RAD51C, RAD51D, RB1, RECQL4, RET, RUNX1, SDHA, SDHAF2, SDHB, SDHC, SDHD, SMAD4, SMARCA4, SMARCB1, SMARCE1, STK11, SUFU, TERC, TERT, TMEM127, TP53, TSC1, TSC2, VHL, WRN, WT1).  . Once analysis starts, results should be available in approximately 2-3 weeks, at which point we will contact her and address implications for her as well as address genetic testing for at-risk family members, if needed.      Dr. Grayland Ormond was available for questions concerning this  case. Total time spent by counseling by phone was approximately 30 minutes.   _____________________________________________________________________   History of Present Illness: Katie Hill, a 65 y.o. female, was referred for genetic counseling to discuss the possibility of a hereditary predisposition to cancer and discuss whether genetic testing is warranted. This was a telegenetics visit via phone.  Ms. Katie Hill was diagnosed with ovarian cancer at the age of 64 and is currently receiving neoadjuvant chemotherapy.   She indicated that she has never had a mammogram and has never had colon cancer screenings. She attributes this to not having a regular physician as she has not needed medical care until now.   Past Medical History:  Diagnosis Date  . High grade ovarian cancer ([place]) 11/20/2016  . Pelvic mass in female     Past Surgical History:  Procedure Laterality Date  . APPENDECTOMY      Family History: Significant diagnoses include the following:  Family History  Problem Relation Age of Onset  . Throat cancer Cousin   . Throat cancer Cousin     Additionally, Ms. Katie Hill has no children. She has two sisters and had a brother who died at 36 in an MVA. Her mother (deceased at 47) had 52 siblings, all cancer-free. Her father (suicide at 70) had 12 siblings, all cancer-free. There are no cancers reported in her grandparents.  Ms. Katie Hill ancestry is [place] and [place]. There is no known Jewish ancestry and no consanguinity.  Discussion: We reviewed the characteristics, features and inheritance patterns of hereditary cancer syndromes. We discussed her  risk of harboring a mutation in the context of her personal and family history as well as how this information may impact future treatment. We discussed the process of genetic testing, insurance coverage and implications of results: positive, negative and variant of unknown significance (VUS).   Ms. Katie Hill questions were  answered to her satisfaction today and she is welcome to call with any additional questions or concerns. Thank you for the referral and allowing [place] to share in the care of your patient.    Steele Berg, MS, [place] Certified Genetic Counselor phone: [phone]

## 2017-02-26 ENCOUNTER — Ambulatory Visit: Admitting: Oncology

## 2017-02-26 ENCOUNTER — Ambulatory Visit

## 2017-02-26 ENCOUNTER — Other Ambulatory Visit

## 2017-02-26 NOTE — Progress Notes (Addendum)
Gynecologic Oncology Consult Visit   Referring Provider: Dr. 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  Subjective:  Katie Hill is a 66 y.o. G0P0 female with advanced ovarian cancer s/p 4 cycles of neoadjuvant chemotherapy who presents for her preoperative visit.   CA125 has fallen from 4,382 to 64.6 (10/29).  CT scan shows dramatic response.  CT scan 10/14 Vascular/Lymphatic: Normal appearance of the abdominal aorta. Interval decrease in size of previously identified bulky retroperitoneal adenopathy. Index aortocaval node measures 1.6 x 2.6 cm, image 32 of series 2. Previously 4.4 x 5.3 cm. Index left periaortic nodal mass measures 2.2 x 1.3 cm, image 31 of series 2. Previously 3.7 x 4.7 cm. At the level of the bifurcation there is a low-attenuation nodal mass which measure 3.1 x 2.6 cm, image 44 of series 2. Previously 4.3 x 4.8 cm. Left common iliac node measures 1.2 cm, image 53 of series 2. Previously 2.2 cm. Left external iliac node measures 1.6 x 1.1 cm, image 67 of series 2. Previously 4.8 x 2.6 cm.  Reproductive: Large mass centered around the uterus measures 9.4 x 5.5 cm, image 67 of series 2. Previously this measured 12.6 x 9.2 Cm.  She presents today to discuss interval debulking surgery.   Oncology history She is a usually healthy patient who presented to the ER 11/13/2016 with symptoms of right lower quadrant pain for 3-4 weeks, associated with nausea, bloating, no change in weight, urinary frequency, and change in bowel habits with more diffucult and stringy BM's.  CT CHEST, ABDOMEN, AND PELVIS WITH CONTRAST IMPRESSION: 1. Large heterogeneous central pelvic mass measures up to 12.6 cm. The lesion generates substantial mass-effect on the uterus, bladder, and pelvic sidewall anatomy. The mass appears to invade the mid sigmoid colon. Etiology of the central pelvic mass is not definitive by CT, but ovarian primary is  suspected. The lesion becomes indistinguishable from the posterior uterus on some images and uterine origin is possible, but the uterus does not appear to be the epicenter of the mass and appears to be more displaced by it. MRI of the pelvis without and with contrast may prove helpful to better delineate the relationship of the uterus to the central pelvic mass although it may not be able to definitively localize the origin. 2. Bulky retroperitoneal and left pelvic sidewall lymphadenopathy. The retroperitoneal lymphadenopathy generates substantial mass-effect on the IVC and left renal vein. The external iliac veins along each pelvic sidewall are markedly attenuated by the mass/lymphadenopathy. Patency of these vessels cannot be definitely confirmed on this exam.  11/16/16 LYMPH NODE, LEFT RETROPERITONEAL; CT-GUIDED CORE BIOPSY:  - METASTATIC HIGH-GRADE SEROUS CARCINOMA.   Decision was made to do neoadjuvant carbo/taxol chemotherapy.  She does not have regular medical cancer and has not has screening mammogram, colonoscopy, or Pap smears. She has not had an abnormal Pap.   Genetic testing pending. She is awaiting insurance coverage  Problem List: Patient Active Problem List   Diagnosis Date Noted  . Hyponatremia 12/12/2016  . Dehydration 12/08/2016  . Malignant neoplasm of ovary ([facility]) 11/20/2016  . Pelvic mass 11/15/2016    Past Medical History: Past Medical History:  Diagnosis Date  . High grade ovarian cancer ([place]) 11/20/2016  . Pelvic mass in female     Past Surgical History: Past Surgical History:  Procedure Laterality Date  . APPENDECTOMY    . PORTA CATH INSERTION N/A 11/27/2016   Procedure: Glori Luis Cath Insertion;  Surgeon: Leotis Pain  S, MD;  Location: [facility];  Service: Cardiovascular;  Laterality: N/A;    Past Gynecologic History:  Menarche: 17 Last Menstrual Period: 24 years ago History of Abnormal pap: no Last pap: years ago   OB History:  OB  History  Gravida Para Term Preterm AB Living  0 0 0 0 0 0  SAB TAB Ectopic Multiple Live Births  0 0 0 0 0        Family History: Family History  Problem Relation Age of Onset  . Throat cancer Cousin   . Throat cancer Cousin     Social History: Social History   Socioeconomic History  . Marital status: Married    Spouse name: Not on file  . Number of children: Not on file  . Years of education: Not on file  . Highest education level: Not on file  Social Needs  . Financial resource strain: Not on file  . Food insecurity - worry: Not on file  . Food insecurity - inability: Not on file  . Transportation needs - medical: Not on file  . Transportation needs - non-medical: Not on file  Occupational History  . Not on file  Tobacco Use  . Smoking status: Never Smoker  . Smokeless tobacco: Never Used  Substance and Sexual Activity  . Alcohol use: Yes    Comment: occasional   . Drug use: No  . Sexual activity: Yes    Birth control/protection: Post-menopausal  Other Topics Concern  . Not on file  Social History Narrative  . Not on file    Allergies: Allergies  Allergen Reactions  . Omeprazole Rash    Current Medications: Current Outpatient Medications  Medication Sig Dispense Refill  . Calcium Carbonate Antacid (TUMS E-X PO) Take by mouth.    . dexamethasone (DECADRON) 4 MG tablet Take 5 tablets (20 mg total) by mouth daily. Take 20 mg daily for 2 days prior to your chemotherapy and take 91m daily for 2 days after chemotherapy. 20 tablet 2  . hydrocortisone 2.5 % cream Apply topically 2 (two) times daily. 30 g 0  . lidocaine-prilocaine (EMLA) cream Apply 1 application topically as needed. Apply small amount to port site at least 1 hour prior to it being accessed, cover with plastic wrap 30 g 1  . ondansetron (ZOFRAN) 4 MG tablet Take 1 tablet (4 mg total) by mouth daily as needed for nausea or vomiting. 30 tablet 0  . oxyCODONE (OXY IR/ROXICODONE) 5 MG immediate  release tablet Take 1 tablet (5 mg total) by mouth every 4 (four) hours as needed for severe pain. 30 tablet 0  . senna (SENOKOT) 8.6 MG TABS tablet Take 2 tablets (17.2 mg total) by mouth daily. Hold if loose stool. 120 each 0  . Tetrahydrozoline HCl (REDNESS RELIEVER EYE DROPS OP) Apply 1 drop to eye daily as needed (red eye).     Current Facility-Administered Medications  Medication Dose Route Frequency Provider Last Rate Last Dose  . ondansetron (ZOFRAN) injection 4 mg  4 mg Intravenous Once DAlgernon Huxley MD        Review of Systems General: no complaints  HEENT: no complaints  Lungs: no complaints  Cardiac: no complaints  GI: abdominal bloating/distension persistent but improved; RLQ pelvic pain resolved  GU: none  Musculoskeletal: back pain  Extremities: no complaints  Skin: no complaints  Neuro: no complaints  Endocrine: no complaints  Psych: no complaints     Objective:  Physical Examination:  BP  115/71 (BP Location: Left Arm, Patient Position: Sitting)   Pulse 90   Temp 98.2 F (36.8 C) (Tympanic)   Resp 18   Ht 5' 8" (1.727 m)   Wt 116 lb 9.6 oz (52.9 kg)   BMI 17.73 kg/m    ECOG Performance Status: 1 - Symptomatic but completely ambulatory  General appearance: alert, cooperative and appears stated age HEENT:PERRLA, extra ocular movement intact and sclera clear, anicteric Lymph node survey: non-palpable, axillary, and supraclavicular, bilateral palpable inguinal nodes but not enlarged. Cardiovascular: regular rate and rhythm Respiratory: normal air entry, lungs clear to auscultation Abdomen: thin and soft with no masses or ascites Back: inspection of back is normal Extremities: extremities normal, atraumatic, no cyanosis or edema Skin exam - normal coloration and turgor, no rashes, no suspicious skin lesions noted. Neurological exam reveals alert, oriented, normal speech, no focal findings or movement disorder noted.  Pelvic: exam chaperoned by nurse;   Vulva: normal appearing vulva with no masses, tenderness or lesions; Vagina: normal vagina; Adnexa: tenderness right with persistent mass adherent to the uterus but significantly smaller than on prior exam, perhaps 4-5 cm now but adherent to the uterus; right uterosacral nodularity/thickening and extension to right lateral sidewall but not involving the sidewall; Uterus: WNL and mobile; Cervix: no lesions; Rectal: confirms.   LABS: Lab Results  Component Value Date   WBC 12.2 (H) 02/05/2017   HGB 10.1 (L) 02/05/2017   HCT 28.9 (L) 02/05/2017   MCV 86.9 02/05/2017   PLT 189 02/05/2017     Chemistry      Component Value Date/Time   NA 132 (L) 02/05/2017 0840   K 3.8 02/05/2017 0840   CL 101 02/05/2017 0840   CO2 22 02/05/2017 0840   BUN 16 02/05/2017 0840   CREATININE 0.53 02/05/2017 0840      Component Value Date/Time   CALCIUM 9.9 02/05/2017 0840   ALKPHOS 65 02/05/2017 0840   AST 20 02/05/2017 0840   ALT 12 (L) 02/05/2017 0840   BILITOT 0.5 02/05/2017 0840     Albumin 4.0    Assessment:  Taraoluwa Joan C Paszkiewicz is a 65 y.o. female diagnosed with high grade serous ovarian cancer on CT directed node biopsy with partial bowel obstruction and extensive bulky adenopathy 8/18. Now s/p 4 cycles of carbo/taxol chemotherapy with dramatic decline in CA125 and improvement on CT scan.    Medical co-morbidities complicating care: prior abdominal surgery.  Plan:   Problem List Items Addressed This Visit      Genitourinary   Malignant neoplasm of ovary (HCC) - Primary   Relevant Orders   CBC with Differential   Comprehensive metabolic panel   Magnesium   CA 125     We discussed surgery and she would like to proceed. Plan laparoscopy first to determine extent of disease, possible laparotomy, TH, BSO, omentectomy, possible node dissection and resection of high PA nodes, tumor debulking, possible bowel resection/ostomy.   Risks were discussed in detail. These include infection,  anesthesia, bleeding, transfusion, wound separation, vaginal cuff dehiscence, medical issues (blood clots, stroke, heart attack, fluid in the lungs, pneumonia, abnormal heart rhythm, death), possible exploratory surgery with larger incision, lymphedema, lymphocyst, allergic reaction, injury to adjacent organs (bowel, bladder, blood vessels, nerves, ureters, uterus).   We reviewed the plan with Dr. Harris and he is not available December 5th, but his partner Dr. Staebler is available to assist with the procedure on that day.   Genetic testing pending. I briefly discussed the SOLO1   data with her and if she is either a BRCA1/2 carrier or has a somatic BRCA1/2 mutation I would recommend olaparib maintenance once she completes her therapy.   I personally reviewed the patient's history, completed key elements of her exam, and was involved in decision making in conjunction with Ms. Allen.   Gillis Ends, MD   CC:  Gae Dry,  [place], [place] [address]

## 2017-02-28 ENCOUNTER — Inpatient Hospital Stay: Attending: Obstetrics and Gynecology | Admitting: Obstetrics and Gynecology

## 2017-02-28 ENCOUNTER — Encounter: Payer: Self-pay | Admitting: Obstetrics and Gynecology

## 2017-02-28 ENCOUNTER — Encounter (INDEPENDENT_AMBULATORY_CARE_PROVIDER_SITE_OTHER): Payer: Self-pay

## 2017-02-28 ENCOUNTER — Inpatient Hospital Stay

## 2017-02-28 VITALS — BP 115/71 | HR 90 | Temp 98.2°F | Resp 18 | Ht 68.0 in | Wt 116.6 lb

## 2017-02-28 DIAGNOSIS — Z9221 Personal history of antineoplastic chemotherapy: Secondary | ICD-10-CM

## 2017-02-28 DIAGNOSIS — C569 Malignant neoplasm of unspecified ovary: Secondary | ICD-10-CM

## 2017-02-28 DIAGNOSIS — Z79899 Other long term (current) drug therapy: Secondary | ICD-10-CM

## 2017-02-28 LAB — COMPREHENSIVE METABOLIC PANEL
ALT: 11 U/L — ABNORMAL LOW (ref 14–54)
ANION GAP: 10 (ref 5–15)
AST: 20 U/L (ref 15–41)
Albumin: 4.3 g/dL (ref 3.5–5.0)
Alkaline Phosphatase: 74 U/L (ref 38–126)
BUN: 12 mg/dL (ref 6–20)
CHLORIDE: 98 mmol/L — AB (ref 101–111)
CO2: 24 mmol/L (ref 22–32)
Calcium: 10.4 mg/dL — ABNORMAL HIGH (ref 8.9–10.3)
Creatinine, Ser: 0.59 mg/dL (ref 0.44–1.00)
GFR calc non Af Amer: >60 mL/min (ref >60)
Glucose, Bld: 99 mg/dL (ref 65–99)
Potassium: 3.9 mmol/L (ref 3.5–5.1)
SODIUM: 132 mmol/L — AB (ref 135–145)
Total Bilirubin: 0.7 mg/dL (ref 0.3–1.2)
Total Protein: 7.6 g/dL (ref 6.5–8.1)

## 2017-02-28 LAB — CBC WITH DIFFERENTIAL/PLATELET
Basophils Absolute: 0 10*3/uL (ref 0–0.1)
Basophils Relative: 1 %
Eosinophils Absolute: 0.1 10*3/uL (ref 0–0.7)
Eosinophils Relative: 3 %
HEMATOCRIT: 30.2 % — AB (ref 35.0–47.0)
HEMOGLOBIN: 10.4 g/dL — AB (ref 12.0–16.0)
Lymphocytes Relative: 24 %
Lymphs Abs: 1.1 10*3/uL (ref 1.0–3.6)
MCH: 31.5 pg (ref 26.0–34.0)
MCHC: 34.6 g/dL (ref 32.0–36.0)
MCV: 91.2 fL (ref 80.0–100.0)
MONOS PCT: 9 %
Monocytes Absolute: 0.4 10*3/uL (ref 0.2–0.9)
NEUTROS ABS: 2.9 10*3/uL (ref 1.4–6.5)
NEUTROS PCT: 63 %
Platelets: 201 10*3/uL (ref 150–440)
RBC: 3.31 MIL/uL — AB (ref 3.80–5.20)
RDW: 21.8 % — ABNORMAL HIGH (ref 11.5–14.5)
WBC: 4.4 10*3/uL (ref 3.6–11.0)

## 2017-02-28 LAB — MAGNESIUM: MAGNESIUM: 2.1 mg/dL (ref 1.7–2.4)

## 2017-02-28 NOTE — H&P (View-Only) (Signed)
Gynecologic Oncology Consult Visit   Referring Provider: Dr. 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  Subjective:  Katie Hill is a 66 y.o. G0P0 female with advanced ovarian cancer s/p 4 cycles of neoadjuvant chemotherapy who presents for her preoperative visit.   CA125 has fallen from 4,382 to 64.6 (10/29).  CT scan shows dramatic response.  CT scan 10/14 Vascular/Lymphatic: Normal appearance of the abdominal aorta. Interval decrease in size of previously identified bulky retroperitoneal adenopathy. Index aortocaval node measures 1.6 x 2.6 cm, image 32 of series 2. Previously 4.4 x 5.3 cm. Index left periaortic nodal mass measures 2.2 x 1.3 cm, image 31 of series 2. Previously 3.7 x 4.7 cm. At the level of the bifurcation there is a low-attenuation nodal mass which measure 3.1 x 2.6 cm, image 44 of series 2. Previously 4.3 x 4.8 cm. Left common iliac node measures 1.2 cm, image 53 of series 2. Previously 2.2 cm. Left external iliac node measures 1.6 x 1.1 cm, image 67 of series 2. Previously 4.8 x 2.6 cm.  Reproductive: Large mass centered around the uterus measures 9.4 x 5.5 cm, image 67 of series 2. Previously this measured 12.6 x 9.2 Cm.  She presents today to discuss interval debulking surgery.   Oncology history She is a usually healthy patient who presented to the ER 11/13/2016 with symptoms of right lower quadrant pain for 3-4 weeks, associated with nausea, bloating, no change in weight, urinary frequency, and change in bowel habits with more diffucult and stringy BM's.  CT CHEST, ABDOMEN, AND PELVIS WITH CONTRAST IMPRESSION: 1. Large heterogeneous central pelvic mass measures up to 12.6 cm. The lesion generates substantial mass-effect on the uterus, bladder, and pelvic sidewall anatomy. The mass appears to invade the mid sigmoid colon. Etiology of the central pelvic mass is not definitive by CT, but ovarian primary is  suspected. The lesion becomes indistinguishable from the posterior uterus on some images and uterine origin is possible, but the uterus does not appear to be the epicenter of the mass and appears to be more displaced by it. MRI of the pelvis without and with contrast may prove helpful to better delineate the relationship of the uterus to the central pelvic mass although it may not be able to definitively localize the origin. 2. Bulky retroperitoneal and left pelvic sidewall lymphadenopathy. The retroperitoneal lymphadenopathy generates substantial mass-effect on the IVC and left renal vein. The external iliac veins along each pelvic sidewall are markedly attenuated by the mass/lymphadenopathy. Patency of these vessels cannot be definitely confirmed on this exam.  11/16/16 LYMPH NODE, LEFT RETROPERITONEAL; CT-GUIDED CORE BIOPSY:  - METASTATIC HIGH-GRADE SEROUS CARCINOMA.   Decision was made to do neoadjuvant carbo/taxol chemotherapy.  She does not have regular medical cancer and has not has screening mammogram, colonoscopy, or Pap smears. She has not had an abnormal Pap.   Genetic testing pending. She is awaiting insurance coverage  Problem List: Patient Active Problem List   Diagnosis Date Noted  . Hyponatremia 12/12/2016  . Dehydration 12/08/2016  . Malignant neoplasm of ovary ([facility]) 11/20/2016  . Pelvic mass 11/15/2016    Past Medical History: Past Medical History:  Diagnosis Date  . High grade ovarian cancer ([place]) 11/20/2016  . Pelvic mass in female     Past Surgical History: Past Surgical History:  Procedure Laterality Date  . APPENDECTOMY    . PORTA CATH INSERTION N/A 11/27/2016   Procedure: Glori Luis Cath Insertion;  Surgeon: Leotis Pain  S, MD;  Location: [facility];  Service: Cardiovascular;  Laterality: N/A;    Past Gynecologic History:  Menarche: 17 Last Menstrual Period: 24 years ago History of Abnormal pap: no Last pap: years ago   OB History:  OB  History  Gravida Para Term Preterm AB Living  0 0 0 0 0 0  SAB TAB Ectopic Multiple Live Births  0 0 0 0 0        Family History: Family History  Problem Relation Age of Onset  . Throat cancer Cousin   . Throat cancer Cousin     Social History: Social History   Socioeconomic History  . Marital status: Married    Spouse name: Not on file  . Number of children: Not on file  . Years of education: Not on file  . Highest education level: Not on file  Social Needs  . Financial resource strain: Not on file  . Food insecurity - worry: Not on file  . Food insecurity - inability: Not on file  . Transportation needs - medical: Not on file  . Transportation needs - non-medical: Not on file  Occupational History  . Not on file  Tobacco Use  . Smoking status: Never Smoker  . Smokeless tobacco: Never Used  Substance and Sexual Activity  . Alcohol use: Yes    Comment: occasional   . Drug use: No  . Sexual activity: Yes    Birth control/protection: Post-menopausal  Other Topics Concern  . Not on file  Social History Narrative  . Not on file    Allergies: Allergies  Allergen Reactions  . Omeprazole Rash    Current Medications: Current Outpatient Medications  Medication Sig Dispense Refill  . Calcium Carbonate Antacid (TUMS E-X PO) Take by mouth.    . dexamethasone (DECADRON) 4 MG tablet Take 5 tablets (20 mg total) by mouth daily. Take 20 mg daily for 2 days prior to your chemotherapy and take 91m daily for 2 days after chemotherapy. 20 tablet 2  . hydrocortisone 2.5 % cream Apply topically 2 (two) times daily. 30 g 0  . lidocaine-prilocaine (EMLA) cream Apply 1 application topically as needed. Apply small amount to port site at least 1 hour prior to it being accessed, cover with plastic wrap 30 g 1  . ondansetron (ZOFRAN) 4 MG tablet Take 1 tablet (4 mg total) by mouth daily as needed for nausea or vomiting. 30 tablet 0  . oxyCODONE (OXY IR/ROXICODONE) 5 MG immediate  release tablet Take 1 tablet (5 mg total) by mouth every 4 (four) hours as needed for severe pain. 30 tablet 0  . senna (SENOKOT) 8.6 MG TABS tablet Take 2 tablets (17.2 mg total) by mouth daily. Hold if loose stool. 120 each 0  . Tetrahydrozoline HCl (REDNESS RELIEVER EYE DROPS OP) Apply 1 drop to eye daily as needed (red eye).     Current Facility-Administered Medications  Medication Dose Route Frequency Provider Last Rate Last Dose  . ondansetron (ZOFRAN) injection 4 mg  4 mg Intravenous Once DAlgernon Huxley MD        Review of Systems General: no complaints  HEENT: no complaints  Lungs: no complaints  Cardiac: no complaints  GI: abdominal bloating/distension persistent but improved; RLQ pelvic pain resolved  GU: none  Musculoskeletal: back pain  Extremities: no complaints  Skin: no complaints  Neuro: no complaints  Endocrine: no complaints  Psych: no complaints     Objective:  Physical Examination:  BP  115/71 (BP Location: Left Arm, Patient Position: Sitting)   Pulse 90   Temp 98.2 F (36.8 C) (Tympanic)   Resp 18   Ht _0  (1.727 m)   Wt 116 lb 9.6 oz (52.9 kg)   BMI 17.73 kg/m    ECOG Performance Status: 1 - Symptomatic but completely ambulatory  General appearance: alert, cooperative and appears stated age HEENT:PERRLA, extra ocular movement intact and sclera clear, anicteric Lymph node survey: non-palpable, axillary, and supraclavicular, bilateral palpable inguinal nodes but not enlarged. Cardiovascular: regular rate and rhythm Respiratory: normal air entry, lungs clear to auscultation Abdomen: thin and soft with no masses or ascites Back: inspection of back is normal Extremities: extremities normal, atraumatic, no cyanosis or edema Skin exam - normal coloration and turgor, no rashes, no suspicious skin lesions noted. Neurological exam reveals alert, oriented, normal speech, no focal findings or movement disorder noted.  Pelvic: exam chaperoned by nurse;   Vulva: normal appearing vulva with no masses, tenderness or lesions; Vagina: normal vagina; Adnexa: tenderness right with persistent mass adherent to the uterus but significantly smaller than on prior exam, perhaps 4-5 cm now but adherent to the uterus; right uterosacral nodularity/thickening and extension to right lateral sidewall but not involving the sidewall; Uterus: WNL and mobile; Cervix: no lesions; Rectal: confirms.   LABS: Lab Results  Component Value Date   WBC 12.2 (H) 02/05/2017   HGB 10.1 (L) 02/05/2017   HCT 28.9 (L) 02/05/2017   MCV 86.9 02/05/2017   PLT 189 02/05/2017     Chemistry      Component Value Date/Time   NA 132 (L) 02/05/2017 0840   K 3.8 02/05/2017 0840   CL 101 02/05/2017 0840   CO2 22 02/05/2017 0840   BUN 16 02/05/2017 0840   CREATININE 0.53 02/05/2017 0840      Component Value Date/Time   CALCIUM 9.9 02/05/2017 0840   ALKPHOS 65 02/05/2017 0840   AST 20 02/05/2017 0840   ALT 12 (L) 02/05/2017 0840   BILITOT 0.5 02/05/2017 0840     Albumin 4.0    Assessment:  Katie Hill is a 66 y.o. female diagnosed with high grade serous ovarian cancer on CT directed node biopsy with partial bowel obstruction and extensive bulky adenopathy 8/18. Now s/p 4 cycles of carbo/taxol chemotherapy with dramatic decline in CA125 and improvement on CT scan.    Medical co-morbidities complicating care: prior abdominal surgery.  Plan:   Problem List Items Addressed This Visit      Genitourinary   Malignant neoplasm of ovary ([place]) - Primary   Relevant Orders   CBC with Differential   Comprehensive metabolic panel   Magnesium   CA 125     We discussed surgery and she would like to proceed. Plan laparoscopy first to determine extent of disease, possible laparotomy, TH, BSO, omentectomy, possible node dissection and resection of high PA nodes, tumor debulking, possible bowel resection/ostomy.   Risks were discussed in detail. These include infection,  anesthesia, bleeding, transfusion, wound separation, vaginal cuff dehiscence, medical issues (blood clots, stroke, heart attack, fluid in the lungs, pneumonia, abnormal heart rhythm, death), possible exploratory surgery with larger incision, lymphedema, lymphocyst, allergic reaction, injury to adjacent organs (bowel, bladder, blood vessels, nerves, ureters, uterus).   We reviewed the plan with Dr. Kenton Kingfisher and he is not available December 5th, but his partner Dr. Georgianne Fick is available to assist with the procedure on that day.   Genetic testing pending. I briefly discussed the SOLO1  data with her and if she is either a BRCA1/2 carrier or has a somatic BRCA1/2 mutation I would recommend olaparib maintenance once she completes her therapy.   Gillis Ends, MD  CC:  Gae Dry,  [place], [place] [address]

## 2017-02-28 NOTE — Progress Notes (Signed)
  Oncology Nurse Navigator Documentation  Navigator Location: [facility] (02/28/17 1300)   )Navigator Encounter Type: Follow-up Appt (02/28/17 1300)    Surgery being scheduled for 12/5 with Dr. Fransisca Connors and Georgianne Fick. PAT to be scheduled and she will be notified of this appointment. Labs today to be performed. Educated on upcoming surgery, including pre and post op education. Post op appointment arranged and given to Ms. Katie Hill. This appointment is 04/11/17 with Dr. Fransisca Connors. Copy of education provided in AVS. Katie Hill was given my phone number to call for any questions. Escorted to the lab.                                                Time Spent with Patient: 30 (02/28/17 1300)

## 2017-02-28 NOTE — Patient Instructions (Signed)
Laparoscopy Laparoscopy is a procedure to diagnose diseases in the abdomen. During the procedure, a thin, lighted, pencil-sized instrument called a laparoscope is inserted into the abdomen through an incision. The laparoscope allows your health care provider to look at the organs inside your body. LET YOUR HEALTH CARE PROVIDER KNOW ABOUT:  Any allergies you have.  All medicines you are taking, including vitamins, herbs, eye drops, creams, and over-the-counter medicines.  Previous problems you or members of your family have had with the use of anesthetics.  Any blood disorders you have.  Previous surgeries you have had.  Medical conditions you have. RISKS AND COMPLICATIONS  Generally, this is a safe procedure. However, problems can occur, which may include:  Infection.  Bleeding.  Damage to other organs.  Allergic reaction to the anesthetics used during the procedure. BEFORE THE PROCEDURE  Do not eat or drink anything after midnight on the night before the procedure or as directed by your health care provider.  Ask your health care provider about: ? Changing or stopping your regular medicines. ? Taking medicines such as aspirin and ibuprofen. These medicines can thin your blood. Do not take these medicines before your procedure if your health care provider instructs you not to.  Plan to have someone take you home after the procedure. PROCEDURE  You may be given a medicine to help you relax (sedative).  You will be given a medicine to make you sleep (general anesthetic).  Your abdomen will be inflated with a gas. This will make your organs easier to see.  Small incisions will be made in your abdomen.  A laparoscope and other small instruments will be inserted into the abdomen through the incisions.  A tissue sample may be removed from an organ in the abdomen for examination.  The instruments will be removed from the abdomen.  The gas will be released.  The incisions  will be closed with stitches (sutures). AFTER THE PROCEDURE  Your blood pressure, heart rate, breathing rate, and blood oxygen level will be monitored often until the medicines you were given have worn off.   This information is not intended to replace advice given to you by your health care provider. Make sure you discuss any questions you have with your health care provider.     Clear Liquid Diet for GYN Oncology Patients Day Before Surgery The day before your scheduled surgery DO NOT EAT any solid foods.  We do want you to drink enough liquids, but NO MILK products.  We do not want you to be dehydrated.  Clear liquids are defined as no milk products and no pieces of any solid food. The following are all approved for you to drink the day before you surgery.  Chicken, Beef or Vegetable Broth (bouillon or consomm) - NO BROTH AFTER MIDNIGHT  Plain Jello  (no fruit)  Water  Strained lemonade or fruit punch  Gatorade (any flavor)  CLEAR Ensure or Boost Breeze  Fruit juices without pulp, such as apple, grape, or cranberry juice  Clear sodas - NO SODA AFTER MIDNIGHT  Ice Pops without bits of fruit or fruit pulp  Honey  Tea or coffee without milk or cream Any foods not on the above list should be avoided.                                                                                 DIVISION OF GYNECOLOGIC ONCOLOGY BOWEL PREP   The following instructions are extremely important to prepare for your surgery. Please follow them carefully   Step 1: Liquid Diet Instructions   The day before surgery, drink ONLY CLEAR LIQUIDS for breakfast, lunch, dinner and throughout the day.  Drink at least 64 oz of fluid.             CLEAR LIQUID EXAMPLES:             Beef, chicken or vegetable broth, sodas, coffee, tea (sugar, lemon             artificial sweeteners, honey are acceptable), juices (apple, grape, cranberry, any    mixture of clear juices). Kool-Aid, Gatorade, Jell-o (without  fruit), popsicles                          NO MILK, MILK PRODUCTS, NON-DAIRY CREAMERS    Step 2: Laxatives           The evening before surgery:   Time: around 5pm   Follow these instructions carefully.   Administer 1 Dulcolax suppository according to manufacturer instructions on the box. You will need to purchase this laxative at a pharmacy or grocery store.     Individual responses to laxatives vary; this prep may cause multiple bowel movements. It often works in 30 minutes and may take as long as 3 hours. Stay near an available bathroom.    It is important to stay hydrated. Ensure you are still drinking clear liquids.      IMPORTANT: FOR YOUR SAFETY, WE WILL HAVE TO CANCEL YOUR SURGERY IF YOU DO NOT FOLLOW THESE INSTRUCTIONS.    Do not eat anything after midnight (including gum or candy) prior to your surgery.  Avoid drinking carbonated beverages after midnight.  You can have clear liquids up until one hour before you arrive at the hospital. "Nothing by mouth" means no liquids, gum, candy, etc for one hour before your arrival time.    Bowel Symptoms After Surgery After gynecologic surgery, women often have temporary changes in bowel function (constipation and gas pain).  Following are tips to help prevent and treat common bowel problems.  It also tells you when to call the doctor.  This is important because some symptoms might be a sign of a more serious bowel problem such as obstruction (bowel blockage).  These problems are rare but can happen after gynecologic surgery.   Besides surgery, what can temporarily affect bowel function? 1. Dietary changes   2. Decreased physical activity   3.Antibiotics   4. Pain medication   How can I prevent constipation (three days or more without a stool)? 1. Include fiber in your diet: whole grains, raw or dried fruits & vegetables, prunes, prune/pear juiceDrink at least 8 glasses of liquid (preferably water) every day 2. Avoid: ? Gas forming  foods such as broccoli, beans, peas, salads, cabbage, sweet potatoes ? Greasy, fatty, or fried foods 3. Activity helps bowel function return to normal, walk around the house at least 3-4 times each day for 15 minutes or longer, if tolerated.  Rocking in a rocking chair is preferable to sitting still. 4. Stool softeners: these are not laxatives, but serve to soften the stool to avoid straining.  Take 2-4 times a day until normal bowel function returns         Examples: Colace or generic equivalent (Docusafe) 5. Bulk laxatives: provide a concentrated source of fiber.    They do not stimulate the bowel.  Take 1-2 times each day until normal bowel function return.              Examples: Citrucel, Metamucil, Fiberal, Fibercon   What can I take for "Gas Pains"? 1. Simethicone (Mylicon, Gas-X, Maalox-Gas, Mylanta-Gas) take 3-4 times a day 2. Maalox Regular - take 3-4 times a day 3. Mylanta Regular - take 3-4 times a day   What can I take if I become constipated? 1. Start with stool softeners and add additional laxatives below as needed to have a bowel movement every 1-2 days  2. Stool softeners 1-2 tablets, 2 times a day 3. Senakot 1-2 tablets, 1-2 times a day 4. Glycerin suppository can soften hard stool take once a day 5. Bisacodyl suppository once a day  6. Milk of Magnesia 30 mL 1-2 times a day 7. Fleets or tap water enema    What can I do for nausea?  1. Limit most solid foods for 24-48 hours 2. Continue eating small frequent amounts of liquids and/or bland soft foods ? Toast, crackers, cooked cereal (grits, cream of wheat, rice) 3. Benadryl: a mild anti-nausea medicine can be obtained without a prescription. May cause drowsiness, especially if taken with narcotic pain medicines 4. Contact provider for prescription nausea medication     What can I do, or take for diarrhea (more than five loose stools per day)? 1. Drink plenty of clear fluids to prevent dehydration 2. May take Kaopectate,  Pepto-Bismol, Immodium, or probiotics for 1-2 days 3. Annusol or Preparation-H can be helpful for hemorrhoids and irritated tissue around anus   When should I call the doctor?             CONSTIPATION:   Not relieved after three days following the above program VOMITING:  That contains blood, "coffee ground" material  More the three times/hour and unable to keep down nausea medication for more than eight hours  With dry mouth, dark or strong urine, feeling light-headed, dizzy, or confused  With severe abdominal pain or bloating for more than 24 hours DIARRHEA:  That continues for more then 24-48 hours despite treatment  That contains blood or tarry material  With dry mouth, dark or strong urine, feeling light~headed, dizzy, or confused FEVER:  101 F or higher along with nausea, vomiting, gas pain, diarrhea UNABLE TO:  Pass gas from rectum for more than 24 hours  Tolerate liquids by mouth for more than 24 hours     Laparoscopy, Care After Refer to this sheet in the next few weeks. These instructions provide you with information about caring for yourself after your procedure. Your health care provider may also give you more specific instructions. Your treatment has been planned according to current medical practices, but problems sometimes occur. Call your health care provider if you have any problems or questions after your procedure. WHAT TO EXPECT AFTER THE PROCEDURE After your procedure, it is common to have mild discomfort in the throat and abdomen. HOME CARE INSTRUCTIONS  Take over-the-counter and prescription medicines only as told by your health care provider.  Do not drive for 24 hours if you received a sedative.  Return to your normal activities as told by your health care provider.  Do not take baths, swim, or use a hot tub until your health care provider approves. You may shower.  Follow instructions from your health care provider about how to take care of  your incision. Make sure you: ? Wash   your hands with soap and water before you change your bandage (dressing). If soap and water are not available, use hand sanitizer. ? Change your dressing as told by your health care provider. ? Leave stitches (sutures), skin glue, or adhesive strips in place. These skin closures may need to stay in place for 2 weeks or longer. If adhesive strip edges start to loosen and curl up, you may trim the loose edges. Do not remove adhesive strips completely unless your health care provider tells you to do that.  Check your incision area every day for signs of infection. Check for: ? More redness, swelling, or pain. ? More fluid or blood. ? Warmth. ? Pus or a bad smell.  It is your responsibility to get the results of your procedure. Ask your health care provider or the department performing the procedure when your results will be ready. SEEK MEDICAL CARE IF:  There is new pain in your shoulders.  You feel light-headed or faint.  You are unable to pass gas or unable to have a bowel movement.  You feel nauseous or you vomit.  You develop a rash.  You have more redness, swelling, or pain around your incision.  You have more fluid or blood coming from your incision.  Your incision feels warm to the touch.  You have pus or a bad smell coming from your incision.  You have a fever or chills. SEEK IMMEDIATE MEDICAL CARE IF:  Your pain is getting worse.  You have ongoing vomiting.  The edges of your incision open up.  You have trouble breathing.  You have chest pain.   This information is not intended to replace advice given to you by your health care provider. Make sure you discuss any questions you have with your health care provider.   Laparoscopic Hysterectomy, Care After Refer to this sheet in the next few weeks. These instructions provide you with information on caring for yourself after your procedure. Your health care provider may also give  you more specific instructions. Your treatment has been planned according to current medical practices, but problems sometimes occur. Call your health care provider if you have any problems or questions after your procedure. What can I expect after the procedure?  Pain and bruising at the incision sites. You will be given pain medicine to control it.  Menopausal symptoms such as hot flashes, night sweats, and insomnia if your ovaries were removed.  Sore throat from the breathing tube that was inserted during surgery. Follow these instructions at home:  Only take over-the-counter or prescription medicines for pain, discomfort, or fever as directed by your health care provider.  Do not take aspirin. It can cause bleeding.  Do not drive when taking pain medicine.  Follow your health care provider's advice regarding diet, exercise, lifting, driving, and general activities.  Resume your usual diet as directed and allowed.  Get plenty of rest and sleep.  Do not douche, use tampons, or have sexual intercourse for at least 6 weeks, or until your health care provider gives you permission.  Change your bandages (dressings) as directed by your health care provider.  Monitor your temperature and notify your health care provider of a fever.  Take showers instead of baths for 2-3 weeks.  Do not drink alcohol until your health care provider gives you permission.  If you develop constipation, you may take a mild laxative with your health care provider's permission. Bran foods may help with constipation problems. Drinking enough fluids   to keep your urine clear or pale yellow may help as well.  Try to have someone home with you for 1-2 weeks to help around the house.  Keep all of your follow-up appointments as directed by your health care provider. Contact a health care provider if:  You have swelling, redness, or increasing pain around your incision sites.  You have pus coming from your  incision.  You notice a bad smell coming from your incision.  Your incision breaks open.  You feel dizzy or lightheaded.  You have pain or bleeding when you urinate.  You have persistent diarrhea.  You have persistent nausea and vomiting.  You have abnormal vaginal discharge.  You have a rash.  You have any type of abnormal reaction or develop an allergy to your medicine.  You have poor pain control with your prescribed medicine. Get help right away if:  You have chest pain or shortness of breath.  You have severe abdominal pain that is not relieved with pain medicine.  You have pain or swelling in your legs. This information is not intended to replace advice given to you by your health care provider. Make sure you discuss any questions you have with your health care provider. Document Released: 01/15/2013 Document Revised: 09/02/2015 Document Reviewed: 10/15/2012 [employer] Interactive Patient Education  2017 [employer]     

## 2017-02-28 NOTE — Progress Notes (Signed)
Patient is here today for follow up, she mentions on her check in sheet abdominal distension/bloating.

## 2017-02-28 NOTE — H&P (Signed)
Gynecologic Oncology Consult Visit   Referring Provider: Dr. 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  Subjective:  Katie Hill is a 66 y.o. G0P0 female with advanced ovarian cancer s/p 4 cycles of neoadjuvant chemotherapy who presents for her preoperative visit.   CA125 has fallen from 4,382 to 64.6 (10/29).  CT scan shows dramatic response.  CT scan 10/14 Vascular/Lymphatic: Normal appearance of the abdominal aorta. Interval decrease in size of previously identified bulky retroperitoneal adenopathy. Index aortocaval node measures 1.6 x 2.6 cm, image 32 of series 2. Previously 4.4 x 5.3 cm. Index left periaortic nodal mass measures 2.2 x 1.3 cm, image 31 of series 2. Previously 3.7 x 4.7 cm. At the level of the bifurcation there is a low-attenuation nodal mass which measure 3.1 x 2.6 cm, image 44 of series 2. Previously 4.3 x 4.8 cm. Left common iliac node measures 1.2 cm, image 53 of series 2. Previously 2.2 cm. Left external iliac node measures 1.6 x 1.1 cm, image 67 of series 2. Previously 4.8 x 2.6 cm.  Reproductive: Large mass centered around the uterus measures 9.4 x 5.5 cm, image 67 of series 2. Previously this measured 12.6 x 9.2 Cm.  She presents today to discuss interval debulking surgery.   Oncology history She is a usually healthy patient who presented to the ER 11/13/2016 with symptoms of right lower quadrant pain for 3-4 weeks, associated with nausea, bloating, no change in weight, urinary frequency, and change in bowel habits with more diffucult and stringy BM's.  CT CHEST, ABDOMEN, AND PELVIS WITH CONTRAST IMPRESSION: 1. Large heterogeneous central pelvic mass measures up to 12.6 cm. The lesion generates substantial mass-effect on the uterus, bladder, and pelvic sidewall anatomy. The mass appears to invade the mid sigmoid colon. Etiology of the central pelvic mass is not definitive by CT, but ovarian primary is  suspected. The lesion becomes indistinguishable from the posterior uterus on some images and uterine origin is possible, but the uterus does not appear to be the epicenter of the mass and appears to be more displaced by it. MRI of the pelvis without and with contrast may prove helpful to better delineate the relationship of the uterus to the central pelvic mass although it may not be able to definitively localize the origin. 2. Bulky retroperitoneal and left pelvic sidewall lymphadenopathy. The retroperitoneal lymphadenopathy generates substantial mass-effect on the IVC and left renal vein. The external iliac veins along each pelvic sidewall are markedly attenuated by the mass/lymphadenopathy. Patency of these vessels cannot be definitely confirmed on this exam.  11/16/16 LYMPH NODE, LEFT RETROPERITONEAL; CT-GUIDED CORE BIOPSY:  - METASTATIC HIGH-GRADE SEROUS CARCINOMA.   Decision was made to do neoadjuvant carbo/taxol chemotherapy.  She does not have regular medical cancer and has not has screening mammogram, colonoscopy, or Pap smears. She has not had an abnormal Pap.   Genetic testing pending. She is awaiting insurance coverage  Problem List: Patient Active Problem List   Diagnosis Date Noted  . Hyponatremia 12/12/2016  . Dehydration 12/08/2016  . Malignant neoplasm of ovary ([place]) 11/20/2016  . Pelvic mass 11/15/2016    Past Medical History: Past Medical History:  Diagnosis Date  . High grade ovarian cancer ([place]) 11/20/2016  . Pelvic mass in female     Past Surgical History: Past Surgical History:  Procedure Laterality Date  . APPENDECTOMY    . PORTA CATH INSERTION N/A 11/27/2016   Procedure: Glori Luis Cath Insertion;  Surgeon: Leotis Pain  S, MD;  Location: [facility];  Service: Cardiovascular;  Laterality: N/A;    Past Gynecologic History:  Menarche: 17 Last Menstrual Period: 24 years ago History of Abnormal pap: no Last pap: years ago   OB History:  OB  History  Gravida Para Term Preterm AB Living  0 0 0 0 0 0  SAB TAB Ectopic Multiple Live Births  0 0 0 0 0        Family History: Family History  Problem Relation Age of Onset  . Throat cancer Cousin   . Throat cancer Cousin     Social History: Social History   Socioeconomic History  . Marital status: Married    Spouse name: Not on file  . Number of children: Not on file  . Years of education: Not on file  . Highest education level: Not on file  Social Needs  . Financial resource strain: Not on file  . Food insecurity - worry: Not on file  . Food insecurity - inability: Not on file  . Transportation needs - medical: Not on file  . Transportation needs - non-medical: Not on file  Occupational History  . Not on file  Tobacco Use  . Smoking status: Never Smoker  . Smokeless tobacco: Never Used  Substance and Sexual Activity  . Alcohol use: Yes    Comment: occasional   . Drug use: No  . Sexual activity: Yes    Birth control/protection: Post-menopausal  Other Topics Concern  . Not on file  Social History Narrative  . Not on file    Allergies: Allergies  Allergen Reactions  . Omeprazole Rash    Current Medications: Current Outpatient Medications  Medication Sig Dispense Refill  . Calcium Carbonate Antacid (TUMS E-X PO) Take by mouth.    . dexamethasone (DECADRON) 4 MG tablet Take 5 tablets (20 mg total) by mouth daily. Take 20 mg daily for 2 days prior to your chemotherapy and take 91m daily for 2 days after chemotherapy. 20 tablet 2  . hydrocortisone 2.5 % cream Apply topically 2 (two) times daily. 30 g 0  . lidocaine-prilocaine (EMLA) cream Apply 1 application topically as needed. Apply small amount to port site at least 1 hour prior to it being accessed, cover with plastic wrap 30 g 1  . ondansetron (ZOFRAN) 4 MG tablet Take 1 tablet (4 mg total) by mouth daily as needed for nausea or vomiting. 30 tablet 0  . oxyCODONE (OXY IR/ROXICODONE) 5 MG immediate  release tablet Take 1 tablet (5 mg total) by mouth every 4 (four) hours as needed for severe pain. 30 tablet 0  . senna (SENOKOT) 8.6 MG TABS tablet Take 2 tablets (17.2 mg total) by mouth daily. Hold if loose stool. 120 each 0  . Tetrahydrozoline HCl (REDNESS RELIEVER EYE DROPS OP) Apply 1 drop to eye daily as needed (red eye).     Current Facility-Administered Medications  Medication Dose Route Frequency Provider Last Rate Last Dose  . ondansetron (ZOFRAN) injection 4 mg  4 mg Intravenous Once DAlgernon Huxley MD        Review of Systems General: no complaints  HEENT: no complaints  Lungs: no complaints  Cardiac: no complaints  GI: abdominal bloating/distension persistent but improved; RLQ pelvic pain resolved  GU: none  Musculoskeletal: back pain  Extremities: no complaints  Skin: no complaints  Neuro: no complaints  Endocrine: no complaints  Psych: no complaints     Objective:  Physical Examination:  BP  115/71 (BP Location: Left Arm, Patient Position: Sitting)   Pulse 90   Temp 98.2 F (36.8 C) (Tympanic)   Resp 18   Ht 5' 8" (1.727 m)   Wt 116 lb 9.6 oz (52.9 kg)   BMI 17.73 kg/m    ECOG Performance Status: 1 - Symptomatic but completely ambulatory  General appearance: alert, cooperative and appears stated age HEENT:PERRLA, extra ocular movement intact and sclera clear, anicteric Lymph node survey: non-palpable, axillary, and supraclavicular, bilateral palpable inguinal nodes but not enlarged. Cardiovascular: regular rate and rhythm Respiratory: normal air entry, lungs clear to auscultation Abdomen: thin and soft with no masses or ascites Back: inspection of back is normal Extremities: extremities normal, atraumatic, no cyanosis or edema Skin exam - normal coloration and turgor, no rashes, no suspicious skin lesions noted. Neurological exam reveals alert, oriented, normal speech, no focal findings or movement disorder noted.  Pelvic: exam chaperoned by nurse;   Vulva: normal appearing vulva with no masses, tenderness or lesions; Vagina: normal vagina; Adnexa: tenderness right with persistent mass adherent to the uterus but significantly smaller than on prior exam, perhaps 4-5 cm now but adherent to the uterus; right uterosacral nodularity/thickening and extension to right lateral sidewall but not involving the sidewall; Uterus: WNL and mobile; Cervix: no lesions; Rectal: confirms.   LABS: Lab Results  Component Value Date   WBC 12.2 (H) 02/05/2017   HGB 10.1 (L) 02/05/2017   HCT 28.9 (L) 02/05/2017   MCV 86.9 02/05/2017   PLT 189 02/05/2017     Chemistry      Component Value Date/Time   NA 132 (L) 02/05/2017 0840   K 3.8 02/05/2017 0840   CL 101 02/05/2017 0840   CO2 22 02/05/2017 0840   BUN 16 02/05/2017 0840   CREATININE 0.53 02/05/2017 0840      Component Value Date/Time   CALCIUM 9.9 02/05/2017 0840   ALKPHOS 65 02/05/2017 0840   AST 20 02/05/2017 0840   ALT 12 (L) 02/05/2017 0840   BILITOT 0.5 02/05/2017 0840     Albumin 4.0    Assessment:  Katie Hill is a 65 y.o. female diagnosed with high grade serous ovarian cancer on CT directed node biopsy with partial bowel obstruction and extensive bulky adenopathy 8/18. Now s/p 4 cycles of carbo/taxol chemotherapy with dramatic decline in CA125 and improvement on CT scan.    Medical co-morbidities complicating care: prior abdominal surgery.  Plan:   Problem List Items Addressed This Visit      Genitourinary   Malignant neoplasm of ovary (HCC) - Primary   Relevant Orders   CBC with Differential   Comprehensive metabolic panel   Magnesium   CA 125     We discussed surgery and she would like to proceed. Plan laparoscopy first to determine extent of disease, possible laparotomy, TH, BSO, omentectomy, possible node dissection and resection of high PA nodes, tumor debulking, possible bowel resection/ostomy.   Risks were discussed in detail. These include infection,  anesthesia, bleeding, transfusion, wound separation, vaginal cuff dehiscence, medical issues (blood clots, stroke, heart attack, fluid in the lungs, pneumonia, abnormal heart rhythm, death), possible exploratory surgery with larger incision, lymphedema, lymphocyst, allergic reaction, injury to adjacent organs (bowel, bladder, blood vessels, nerves, ureters, uterus).   We reviewed the plan with Dr. Harris and he is not available December 5th, but his partner Dr. Staebler is available to assist with the procedure on that day.   Genetic testing pending. I briefly discussed the SOLO1   data with her and if she is either a BRCA1/2 carrier or has a somatic BRCA1/2 mutation I would recommend olaparib maintenance once she completes her therapy.   SECORD,ANGELES ALVAREZ, MD  CC:  Robert P Harris,  MD [address] [place], [place] [address]  

## 2017-03-01 LAB — CA 125: CANCER ANTIGEN (CA) 125: 36.9 U/mL (ref 0.0–38.1)

## 2017-03-05 ENCOUNTER — Telehealth: Payer: Self-pay

## 2017-03-05 NOTE — Telephone Encounter (Signed)
  Oncology Nurse Navigator Documentation Notified of PAT appointment 11/28 at 0930 in the [facility] [address]. Read back performed. Navigator Location: [facility] (03/05/17 1500)   )Navigator Encounter Type: Telephone (03/05/17 1500) Telephone: Lahoma Crocker Call;Appt Confirmation/Clarification (03/05/17 1500)                                                  Time Spent with Patient: 15 (03/05/17 1500)

## 2017-03-05 NOTE — Telephone Encounter (Signed)
  Oncology Nurse Navigator Documentation Notified of Ca 125 results. Notified of all upcoming appointments on 12/19 to resume chemotherapy and post op with Dr. Fransisca Connors 04/11/17. Read back performed on all appointments. Surgery request sent to OR posting with confirmation. Copy of consent faxed to PAT with confirmation. PAT testing appointment pending.  Ref Range & Units5d ago Cancer Antigen (CA) 1250.0 - 38.1 U/mL36.9   Navigator Location: [facility] (03/05/17 1100)   )Navigator Encounter Type: Diagnostic Results (03/05/17 1100)                                                    Time Spent with Patient: 15 (03/05/17 1100)

## 2017-03-07 ENCOUNTER — Encounter: Admitting: Obstetrics and Gynecology

## 2017-03-07 ENCOUNTER — Ambulatory Visit (INDEPENDENT_AMBULATORY_CARE_PROVIDER_SITE_OTHER): Admitting: Obstetrics and Gynecology

## 2017-03-07 ENCOUNTER — Encounter
Admission: RE | Admit: 2017-03-07 | Discharge: 2017-03-07 | Disposition: A | Discharge status: Home / Self Care | Source: Ambulatory Visit | Attending: Obstetrics and Gynecology | Admitting: Obstetrics and Gynecology

## 2017-03-07 ENCOUNTER — Encounter: Payer: Self-pay | Admitting: Obstetrics and Gynecology

## 2017-03-07 ENCOUNTER — Telehealth: Payer: Self-pay

## 2017-03-07 ENCOUNTER — Other Ambulatory Visit: Payer: Self-pay

## 2017-03-07 VITALS — BP 114/78 | Ht 68.0 in | Wt 117.0 lb

## 2017-03-07 DIAGNOSIS — Z01812 Encounter for preprocedural laboratory examination: Secondary | ICD-10-CM | POA: Diagnosis present

## 2017-03-07 DIAGNOSIS — Z0183 Encounter for blood typing: Secondary | ICD-10-CM | POA: Diagnosis not present

## 2017-03-07 DIAGNOSIS — C569 Malignant neoplasm of unspecified ovary: Secondary | ICD-10-CM | POA: Diagnosis not present

## 2017-03-07 DIAGNOSIS — Z01818 Encounter for other preprocedural examination: Secondary | ICD-10-CM

## 2017-03-07 HISTORY — DX: Cardiac arrhythmia, unspecified: I49.9

## 2017-03-07 NOTE — Telephone Encounter (Signed)
  Oncology Nurse Navigator Documentation [place] duty letter requested by Ms. Talerico is at front desk for pick up. She is aware. Navigator Location: [facility] (03/07/17 1700)   )Navigator Encounter Type: Telephone (03/07/17 1700) Telephone: Madaline Savage Duty) (03/07/17 1700)                                                  Time Spent with Patient: 15 (03/07/17 1700)

## 2017-03-07 NOTE — Progress Notes (Signed)
Briefly met with patient to introduce myself and discussed that I would be assisting in her surgery and taking care of her postoperative care.  We discussed length of surgery will depend on operative course, as well as postoperative recovery.  I did discuss prophylactic lovenox in setting of postoperative period and malignancy.

## 2017-03-07 NOTE — Patient Instructions (Signed)
Your procedure is scheduled on: 03/14/17 Wed Report to Same Day Surgery 2nd floor medical mall [facility] Entrance-take elevator on left to 2nd floor.  Check in with surgery information desk.) To find out your arrival time please call [phone] between 1PM - 3PM on 03/13/17 Tues  Remember: Instructions that are not followed completely may result in serious medical risk, up to and including death, or upon the discretion of your surgeon and anesthesiologist your surgery may need to be rescheduled.    _x___ 1. Do not eat food after midnight the night before your procedure. You may drink clear liquids up to 2 hours before you are scheduled to arrive at the hospital for your procedure.  Do not drink clear liquids within 2 hours of your scheduled arrival to the hospital.  Clear liquids include  --Water or Apple juice without pulp  --Clear carbohydrate beverage such as ClearFast or Gatorade  --Black Coffee or Clear Tea (No milk, no creamers, do not add anything to                  the coffee or Tea Type 1 and type 2 diabetics should only drink water.  No gum chewing or hard candies.     __x__ 2. No Alcohol for 24 hours before or after surgery.   __x__3. No Smoking for 24 prior to surgery.   ____  4. Bring all medications with you on the day of surgery if instructed.    __x__ 5. Notify your doctor if there is any change in your medical condition     (cold, fever, infections).     Do not wear jewelry, make-up, hairpins, clips or nail polish.  Do not wear lotions, powders, or perfumes. You may wear deodorant.  Do not shave 48 hours prior to surgery. Men may shave face and neck.  Do not bring valuables to the hospital.    [facility] is not responsible for any belongings or valuables.               Contacts, dentures or bridgework may not be worn into surgery.  Leave your suitcase in the car. After surgery it may be brought to your room.  For patients admitted to the hospital, discharge  time is determined by your                       treatment team.   Patients discharged the day of surgery will not be allowed to drive home.  You will need someone to drive you home and stay with you the night of your procedure.    Please read over the following fact sheets that you were given:   [facility] Preparing for Surgery and or MRSA Information   _x___ Take anti-hypertensive listed below, cardiac, seizure, asthma,     anti-reflux and psychiatric medicines. These include:  1. None  2.  3.  4.  5.  6.  ____Fleets enema or Magnesium Citrate as directed.   _x___ Use CHG Soap or sage wipes as directed on instruction sheet   ____ Use inhalers on the day of surgery and bring to hospital day of surgery  ____ Stop Metformin and Janumet 2 days prior to surgery.    ____ Take 1/2 of usual insulin dose the night before surgery and none on the morning     surgery.   _x___ Follow recommendations from Cardiologist, Pulmonologist or PCP regarding  stopping Aspirin, Coumadin, Plavix ,Eliquis, Effient, or Pradaxa, and Pletal.  X____Stop Anti-inflammatories such as Advil, Aleve, Ibuprofen, Motrin, Naproxen, Naprosyn, Goodies powders or aspirin products. OK to take Tylenol and                          Celebrex.   _x___ Stop supplements until after surgery.  But may continue Vitamin D, Vitamin B,       and multivitamin.   ____ Bring C-Pap to the hospital.

## 2017-03-12 NOTE — Telephone Encounter (Signed)
Katie Hill now has Medicare and wishes to proceed with genetic testing. She has a lab appointment already on 03/28/17 before seeing Dr. Tasia Catchings and her blood will be drawn at that time.  Steele Berg, [place], [place] Genetic Counselor Phone: [phone]

## 2017-03-14 ENCOUNTER — Encounter
Admission: RE | Disposition: A | Discharge status: Home / Self Care | Payer: Self-pay | Source: Ambulatory Visit | Attending: Obstetrics and Gynecology

## 2017-03-14 ENCOUNTER — Inpatient Hospital Stay: Payer: Medicare Other | Admitting: Anesthesiology

## 2017-03-14 ENCOUNTER — Encounter: Payer: Self-pay | Admitting: *Deleted

## 2017-03-14 ENCOUNTER — Inpatient Hospital Stay
Admission: RE | Admit: 2017-03-14 | Discharge: 2017-03-17 | DRG: 737 | Disposition: A | Discharge status: Home / Self Care | Payer: Medicare Other | Source: Ambulatory Visit | Attending: Obstetrics and Gynecology | Admitting: Obstetrics and Gynecology

## 2017-03-14 ENCOUNTER — Other Ambulatory Visit: Payer: Self-pay

## 2017-03-14 DIAGNOSIS — C561 Malignant neoplasm of right ovary: Secondary | ICD-10-CM | POA: Diagnosis not present

## 2017-03-14 DIAGNOSIS — D62 Acute posthemorrhagic anemia: Secondary | ICD-10-CM | POA: Diagnosis not present

## 2017-03-14 DIAGNOSIS — C569 Malignant neoplasm of unspecified ovary: Secondary | ICD-10-CM

## 2017-03-14 DIAGNOSIS — R1110 Vomiting, unspecified: Secondary | ICD-10-CM | POA: Diagnosis not present

## 2017-03-14 DIAGNOSIS — R1084 Generalized abdominal pain: Secondary | ICD-10-CM | POA: Diagnosis not present

## 2017-03-14 DIAGNOSIS — Z90710 Acquired absence of both cervix and uterus: Secondary | ICD-10-CM

## 2017-03-14 DIAGNOSIS — Z90722 Acquired absence of ovaries, bilateral: Secondary | ICD-10-CM

## 2017-03-14 DIAGNOSIS — R590 Localized enlarged lymph nodes: Secondary | ICD-10-CM | POA: Diagnosis present

## 2017-03-14 DIAGNOSIS — Z5331 Laparoscopic surgical procedure converted to open procedure: Secondary | ICD-10-CM

## 2017-03-14 DIAGNOSIS — G8918 Other acute postprocedural pain: Secondary | ICD-10-CM | POA: Diagnosis not present

## 2017-03-14 DIAGNOSIS — Z9079 Acquired absence of other genital organ(s): Secondary | ICD-10-CM

## 2017-03-14 HISTORY — PX: OMENTECTOMY: SHX5985

## 2017-03-14 HISTORY — PX: LAPAROTOMY: SHX154

## 2017-03-14 HISTORY — PX: LAPAROSCOPY: SHX197

## 2017-03-14 HISTORY — PX: LYMPH NODE DISSECTION: SHX5087

## 2017-03-14 LAB — ABO/RH: ABO/RH(D): O POS

## 2017-03-14 SURGERY — LAPAROSCOPY OPERATIVE
Anesthesia: General | Site: Abdomen | Wound class: Clean Contaminated

## 2017-03-14 MED ORDER — ROCURONIUM BROMIDE 50 MG/5ML IV SOLN
INTRAVENOUS | Status: AC
  Filled 2017-03-14: qty 1

## 2017-03-14 MED ORDER — PROPOFOL 10 MG/ML IV BOLUS
INTRAVENOUS | Status: DC | PRN
Start: 2017-03-14 — End: 2017-03-14
  Administered 2017-03-14: 110 mg via INTRAVENOUS

## 2017-03-14 MED ORDER — OXYCODONE-ACETAMINOPHEN 5-325 MG PO TABS
1.0000 | ORAL_TABLET | ORAL | Status: DC | PRN
  Administered 2017-03-14 – 2017-03-15 (×4): 2 via ORAL
  Administered 2017-03-16: 1 via ORAL
  Administered 2017-03-17: 2 via ORAL
  Filled 2017-03-14 (×2): qty 2
  Filled 2017-03-14: qty 1
  Filled 2017-03-14 (×3): qty 2

## 2017-03-14 MED ORDER — MICROFIBRILLAR COLL HEMOSTAT EX POWD
CUTANEOUS | Status: AC
  Filled 2017-03-14: qty 10

## 2017-03-14 MED ORDER — LIDOCAINE HCL (PF) 2 % IJ SOLN
INTRAMUSCULAR | Status: AC
  Filled 2017-03-14: qty 10

## 2017-03-14 MED ORDER — SUGAMMADEX SODIUM 200 MG/2ML IV SOLN
INTRAVENOUS | Status: DC | PRN
Start: 2017-03-14 — End: 2017-03-14
  Administered 2017-03-14: 200 mg via INTRAVENOUS

## 2017-03-14 MED ORDER — FENTANYL CITRATE (PF) 100 MCG/2ML IJ SOLN
25.0000 ug | INTRAMUSCULAR | Status: DC | PRN
  Administered 2017-03-14 (×4): 25 ug via INTRAVENOUS

## 2017-03-14 MED ORDER — PHENYLEPHRINE HCL 10 MG/ML IJ SOLN
INTRAMUSCULAR | Status: DC | PRN
  Administered 2017-03-14: 100 ug via INTRAVENOUS
  Administered 2017-03-14: 200 ug via INTRAVENOUS
  Administered 2017-03-14 (×2): 100 ug via INTRAVENOUS
  Administered 2017-03-14: 200 ug via INTRAVENOUS
  Administered 2017-03-14: 100 ug via INTRAVENOUS

## 2017-03-14 MED ORDER — ENOXAPARIN SODIUM 40 MG/0.4ML ~~LOC~~ SOLN
40.0000 mg | SUBCUTANEOUS | Status: DC
  Administered 2017-03-15 – 2017-03-17 (×3): 40 mg via SUBCUTANEOUS
  Filled 2017-03-14 (×3): qty 0.4

## 2017-03-14 MED ORDER — EPHEDRINE SULFATE 50 MG/ML IJ SOLN
INTRAMUSCULAR | Status: DC | PRN
  Administered 2017-03-14 (×2): 10 mg via INTRAVENOUS

## 2017-03-14 MED ORDER — BUPIVACAINE LIPOSOME 1.3 % IJ SUSP
INTRAMUSCULAR | Status: DC | PRN
  Administered 2017-03-14: 20 mL

## 2017-03-14 MED ORDER — CEFAZOLIN SODIUM-DEXTROSE 2-4 GM/100ML-% IV SOLN
INTRAVENOUS | Status: AC
  Filled 2017-03-14: qty 100

## 2017-03-14 MED ORDER — ONDANSETRON HCL 4 MG/2ML IJ SOLN
INTRAMUSCULAR | Status: AC
  Filled 2017-03-14: qty 2

## 2017-03-14 MED ORDER — ACETAMINOPHEN NICU IV SYRINGE 10 MG/ML
INTRAVENOUS | Status: AC
  Filled 2017-03-14: qty 1

## 2017-03-14 MED ORDER — BUPIVACAINE HCL (PF) 0.5 % IJ SOLN
INTRAMUSCULAR | Status: AC
  Filled 2017-03-14: qty 30

## 2017-03-14 MED ORDER — SODIUM CHLORIDE 0.9 % IV SOLN
INTRAVENOUS | Status: DC | PRN
  Administered 2017-03-14: 10:00:00 via INTRAVENOUS

## 2017-03-14 MED ORDER — BUPIVACAINE LIPOSOME 1.3 % IJ SUSP
INTRAMUSCULAR | Status: AC
  Filled 2017-03-14: qty 20

## 2017-03-14 MED ORDER — FENTANYL CITRATE (PF) 250 MCG/5ML IJ SOLN
INTRAMUSCULAR | Status: AC
  Filled 2017-03-14: qty 5

## 2017-03-14 MED ORDER — KETOROLAC TROMETHAMINE 30 MG/ML IJ SOLN
INTRAMUSCULAR | Status: AC
  Filled 2017-03-14: qty 1

## 2017-03-14 MED ORDER — MIDAZOLAM HCL 2 MG/2ML IJ SOLN
INTRAMUSCULAR | Status: AC
  Filled 2017-03-14: qty 2

## 2017-03-14 MED ORDER — FAMOTIDINE 20 MG PO TABS
ORAL_TABLET | ORAL | Status: AC
  Administered 2017-03-14: 20 mg via ORAL
  Filled 2017-03-14: qty 1

## 2017-03-14 MED ORDER — FAMOTIDINE 20 MG PO TABS
20.0000 mg | ORAL_TABLET | Freq: Once | ORAL | Status: AC
  Administered 2017-03-14: 20 mg via ORAL

## 2017-03-14 MED ORDER — CEFAZOLIN SODIUM-DEXTROSE 2-4 GM/100ML-% IV SOLN
2.0000 g | INTRAVENOUS | Status: AC
  Administered 2017-03-14: 2 g via INTRAVENOUS

## 2017-03-14 MED ORDER — KETAMINE HCL 10 MG/ML IJ SOLN
INTRAMUSCULAR | Status: DC | PRN
  Administered 2017-03-14: 50 mg via INTRAVENOUS

## 2017-03-14 MED ORDER — FENTANYL CITRATE (PF) 100 MCG/2ML IJ SOLN
INTRAMUSCULAR | Status: AC
  Administered 2017-03-14: 25 ug via INTRAVENOUS
  Filled 2017-03-14: qty 2

## 2017-03-14 MED ORDER — ACETAMINOPHEN 10 MG/ML IV SOLN
INTRAVENOUS | Status: DC | PRN
  Administered 2017-03-14: 1000 mg via INTRAVENOUS

## 2017-03-14 MED ORDER — FENTANYL CITRATE (PF) 100 MCG/2ML IJ SOLN
INTRAMUSCULAR | Status: DC | PRN
  Administered 2017-03-14 (×2): 25 ug via INTRAVENOUS
  Administered 2017-03-14 (×2): 50 ug via INTRAVENOUS

## 2017-03-14 MED ORDER — PROMETHAZINE HCL 25 MG RE SUPP: 25.0000 mg | Freq: Three times a day (TID) | RECTAL | Status: DC | PRN

## 2017-03-14 MED ORDER — SIMETHICONE 80 MG PO CHEW
80.0000 mg | CHEWABLE_TABLET | Freq: Four times a day (QID) | ORAL | Status: DC | PRN
  Administered 2017-03-17: 80 mg via ORAL
  Filled 2017-03-14: qty 1

## 2017-03-14 MED ORDER — ONDANSETRON HCL 4 MG/2ML IJ SOLN: 4.0000 mg | Freq: Once | INTRAMUSCULAR | Status: DC | PRN

## 2017-03-14 MED ORDER — MICROFIBRILLAR COLL HEMOSTAT EX POWD
CUTANEOUS | Status: DC | PRN
  Administered 2017-03-14: 2 g via TOPICAL

## 2017-03-14 MED ORDER — LIDOCAINE HCL (CARDIAC) 20 MG/ML IV SOLN
INTRAVENOUS | Status: DC | PRN
  Administered 2017-03-14: 50 mg via INTRAVENOUS

## 2017-03-14 MED ORDER — DEXAMETHASONE SODIUM PHOSPHATE 10 MG/ML IJ SOLN
INTRAMUSCULAR | Status: DC | PRN
  Administered 2017-03-14: 10 mg via INTRAVENOUS

## 2017-03-14 MED ORDER — MORPHINE SULFATE (PF) 2 MG/ML IV SOLN
1.0000 mg | INTRAVENOUS | Status: DC | PRN
  Administered 2017-03-14: 2 mg via INTRAVENOUS
  Administered 2017-03-14: 1 mg via INTRAVENOUS
  Administered 2017-03-16 (×2): 2 mg via INTRAVENOUS
  Filled 2017-03-14 (×4): qty 1

## 2017-03-14 MED ORDER — SUGAMMADEX SODIUM 200 MG/2ML IV SOLN
INTRAVENOUS | Status: AC
  Filled 2017-03-14: qty 2

## 2017-03-14 MED ORDER — PROPOFOL 10 MG/ML IV BOLUS
INTRAVENOUS | Status: AC
  Filled 2017-03-14: qty 20

## 2017-03-14 MED ORDER — DEXTROSE IN LACTATED RINGERS 5 % IV SOLN: INTRAVENOUS | Status: DC

## 2017-03-14 MED ORDER — ONDANSETRON HCL 4 MG/2ML IJ SOLN
INTRAMUSCULAR | Status: DC | PRN
  Administered 2017-03-14: 4 mg via INTRAVENOUS

## 2017-03-14 MED ORDER — DEXAMETHASONE SODIUM PHOSPHATE 10 MG/ML IJ SOLN
INTRAMUSCULAR | Status: AC
  Filled 2017-03-14: qty 1

## 2017-03-14 MED ORDER — ONDANSETRON HCL 4 MG/2ML IJ SOLN
4.0000 mg | Freq: Four times a day (QID) | INTRAMUSCULAR | Status: DC | PRN
  Administered 2017-03-14 – 2017-03-16 (×3): 4 mg via INTRAVENOUS
  Filled 2017-03-14 (×4): qty 2

## 2017-03-14 MED ORDER — BUPIVACAINE HCL (PF) 0.5 % IJ SOLN
INTRAMUSCULAR | Status: DC | PRN
  Administered 2017-03-14: 20 mL

## 2017-03-14 MED ORDER — MENTHOL 3 MG MT LOZG: 1.0000 | LOZENGE | OROMUCOSAL | Status: DC | PRN

## 2017-03-14 MED ORDER — ROCURONIUM BROMIDE 100 MG/10ML IV SOLN
INTRAVENOUS | Status: DC | PRN
  Administered 2017-03-14: 50 mg via INTRAVENOUS
  Administered 2017-03-14: 20 mg via INTRAVENOUS

## 2017-03-14 MED ORDER — LACTATED RINGERS IV SOLN
INTRAVENOUS | Status: DC
  Administered 2017-03-14 (×2): via INTRAVENOUS

## 2017-03-14 MED ORDER — MIDAZOLAM HCL 2 MG/2ML IJ SOLN
INTRAMUSCULAR | Status: DC | PRN
  Administered 2017-03-14: 2 mg via INTRAVENOUS

## 2017-03-14 MED ORDER — DEXTROSE-NACL 5-0.45 % IV SOLN
INTRAVENOUS | Status: DC
  Administered 2017-03-14 – 2017-03-15 (×5): via INTRAVENOUS
  Administered 2017-03-16: 125 mL/h via INTRAVENOUS
  Administered 2017-03-16 (×2): via INTRAVENOUS

## 2017-03-14 MED ORDER — SOD CITRATE-CITRIC ACID 500-334 MG/5ML PO SOLN: 30.0000 mL | ORAL | Status: DC

## 2017-03-14 MED ORDER — ENOXAPARIN SODIUM 40 MG/0.4ML ~~LOC~~ SOLN
40.0000 mg | SUBCUTANEOUS | Status: AC
  Administered 2017-03-14: 40 mg via SUBCUTANEOUS
  Filled 2017-03-14: qty 0.4

## 2017-03-14 MED ORDER — IBUPROFEN 600 MG PO TABS
600.0000 mg | ORAL_TABLET | Freq: Four times a day (QID) | ORAL | Status: DC | PRN
  Administered 2017-03-15 (×2): 600 mg via ORAL
  Filled 2017-03-14 (×3): qty 1

## 2017-03-14 MED ORDER — SODIUM CHLORIDE 0.9 % IV SOLN
INTRAVENOUS | Status: DC | PRN
  Administered 2017-03-14: 20 ug/min via INTRAVENOUS

## 2017-03-14 MED ORDER — KETAMINE HCL 50 MG/ML IJ SOLN
INTRAMUSCULAR | Status: AC
  Filled 2017-03-14: qty 10

## 2017-03-14 MED ORDER — INDOCYANINE GREEN 25 MG IV SOLR
INTRAVENOUS | Status: AC
  Filled 2017-03-14: qty 25

## 2017-03-14 SURGICAL SUPPLY — 90 items
APPLIER CLIP 11 MED OPEN (CLIP) ×8
APPLIER CLIP 9.375 SM OPEN (CLIP) ×4
BACTOSHIELD CHG 4% 4OZ (MISCELLANEOUS) ×1
BAG URINE DRAINAGE (UROLOGICAL SUPPLIES) ×4 IMPLANT
BARRIER ADHS 3X4 INTERCEED (GAUZE/BANDAGES/DRESSINGS) IMPLANT
BLADE SURG 15 STRL LF DISP TIS (BLADE) ×6 IMPLANT
BLADE SURG 15 STRL SS (BLADE) ×6
CANISTER SUCT 1200ML W/VALVE (MISCELLANEOUS) ×4 IMPLANT
CATH FOLEY 2WAY  5CC 16FR (CATHETERS) ×2
CATH URTH 16FR FL 2W BLN LF (CATHETERS) ×2 IMPLANT
CHLORAPREP W/TINT 26ML (MISCELLANEOUS) ×4 IMPLANT
CLIP APPLIE 11 MED OPEN (CLIP) ×4 IMPLANT
CLIP APPLIE 9.375 SM OPEN (CLIP) ×2 IMPLANT
CNTNR SPEC 2.5X3XGRAD LEK (MISCELLANEOUS) ×2
CONT SPEC 4OZ STER OR WHT (MISCELLANEOUS) ×2
CONTAINER SPEC 2.5X3XGRAD LEK (MISCELLANEOUS) ×2 IMPLANT
CORD MONOPOLAR M/FML 12FT (MISCELLANEOUS) ×4 IMPLANT
DEFOGGER SCOPE WARMER CLEARIFY (MISCELLANEOUS) ×4 IMPLANT
DERMABOND ADVANCED (GAUZE/BANDAGES/DRESSINGS) ×2
DERMABOND ADVANCED .7 DNX12 (GAUZE/BANDAGES/DRESSINGS) ×2 IMPLANT
DRAPE LAP W/FLUID (DRAPES) IMPLANT
DRAPE LAPAROTOMY 100X77 ABD (DRAPES) IMPLANT
DRAPE LAPAROTOMY TRNSV 106X77 (MISCELLANEOUS) IMPLANT
DRAPE LEGGINS SURG 28X43 STRL (DRAPES) ×4 IMPLANT
DRAPE PERI LITHO V/GYN (MISCELLANEOUS) IMPLANT
DRAPE SHEET LG 3/4 BI-LAMINATE (DRAPES) ×4 IMPLANT
DRAPE STERI POUCH LG 24X46 STR (DRAPES) IMPLANT
DRAPE TABLE BACK 80X90 (DRAPES) ×4 IMPLANT
DRAPE UNDER BUTTOCK W/FLU (DRAPES) IMPLANT
DRSG OPSITE POSTOP 4X12 (GAUZE/BANDAGES/DRESSINGS) ×4 IMPLANT
DRSG TEGADERM 2-3/8X2-3/4 SM (GAUZE/BANDAGES/DRESSINGS) IMPLANT
DRSG TELFA 3X8 NADH (GAUZE/BANDAGES/DRESSINGS) IMPLANT
ELECT BLADE 6 FLAT ULTRCLN (ELECTRODE) IMPLANT
ELECT CAUTERY BLADE 6.4 (BLADE) ×4 IMPLANT
ELECT REM PT RETURN 9FT ADLT (ELECTROSURGICAL) ×4
ELECTRODE REM PT RTRN 9FT ADLT (ELECTROSURGICAL) ×2 IMPLANT
GAUZE SPONGE 4X4 12PLY STRL (GAUZE/BANDAGES/DRESSINGS) IMPLANT
GLOVE BIO SURGEON STRL SZ8 (GLOVE) ×12 IMPLANT
GLOVE INDICATOR 8.0 STRL GRN (GLOVE) ×32 IMPLANT
GOWN STRL REUS W/ TWL LRG LVL3 (GOWN DISPOSABLE) ×4 IMPLANT
GOWN STRL REUS W/ TWL XL LVL3 (GOWN DISPOSABLE) ×2 IMPLANT
GOWN STRL REUS W/TWL LRG LVL3 (GOWN DISPOSABLE) ×4
GOWN STRL REUS W/TWL XL LVL3 (GOWN DISPOSABLE) ×2
GRASPER SUT TROCAR 14GX15 (MISCELLANEOUS) IMPLANT
HANDLE YANKAUER SUCT BULB TIP (MISCELLANEOUS) ×4 IMPLANT
IRRIGATION STRYKERFLOW (MISCELLANEOUS) IMPLANT
IRRIGATOR STRYKERFLOW (MISCELLANEOUS)
IV LACTATED RINGERS 1000ML (IV SOLUTION) IMPLANT
KIT PINK PAD W/HEAD ARE REST (MISCELLANEOUS) ×4
KIT PINK PAD W/HEAD ARM REST (MISCELLANEOUS) ×2 IMPLANT
KIT RM TURNOVER CYSTO AR (KITS) ×4 IMPLANT
LABEL OR SOLS (LABEL) ×4 IMPLANT
LIGASURE IMPACT 36 18CM CVD LR (INSTRUMENTS) ×4 IMPLANT
LIGASURE VESSEL 5MM BLUNT TIP (ELECTROSURGICAL) ×4 IMPLANT
MANIPULATOR VCARE LG CRV RETR (MISCELLANEOUS) IMPLANT
MANIPULATOR VCARE STD CRV RETR (MISCELLANEOUS) ×4 IMPLANT
NEEDLE VERESS 14GA 120MM (NEEDLE) IMPLANT
NS IRRIG 1000ML POUR BTL (IV SOLUTION) ×4 IMPLANT
NS IRRIG 500ML POUR BTL (IV SOLUTION) ×4 IMPLANT
PACK BASIN MAJOR ARMC (MISCELLANEOUS) IMPLANT
PACK GYN LAPAROSCOPIC (MISCELLANEOUS) ×4 IMPLANT
PAD OB MATERNITY 4.3X12.25 (PERSONAL CARE ITEMS) ×4 IMPLANT
PAD PREP 24X41 OB/GYN DISP (PERSONAL CARE ITEMS) ×4 IMPLANT
POUCH ENDO CATCH II 15MM (MISCELLANEOUS) IMPLANT
SCRUB CHG 4% DYNA-HEX 4OZ (MISCELLANEOUS) ×3 IMPLANT
SET CYSTO W/LG BORE CLAMP LF (SET/KITS/TRAYS/PACK) IMPLANT
SHEARS ENDO 5MM 31CM (CUTTER) IMPLANT
SPONGE LAP 18X18 5 PK (GAUZE/BANDAGES/DRESSINGS) ×28 IMPLANT
STAPLER SKIN PROX 35W (STAPLE) ×4 IMPLANT
SUT MAXON ABS #0 GS21 30IN (SUTURE) IMPLANT
SUT PDS AB 1 TP1 96 (SUTURE) ×8 IMPLANT
SUT PLAIN 2 0 XLH (SUTURE) IMPLANT
SUT VIC AB 0 CT1 27 (SUTURE) ×6
SUT VIC AB 0 CT1 27XCR 8 STRN (SUTURE) ×6 IMPLANT
SUT VIC AB 0 CT1 36 (SUTURE) IMPLANT
SUT VIC AB 2-0 UR6 27 (SUTURE) IMPLANT
SUT VIC AB 3-0 SH 27 (SUTURE) ×4
SUT VIC AB 3-0 SH 27X BRD (SUTURE) ×4 IMPLANT
SUT VIC AB 4-0 FS2 27 (SUTURE) IMPLANT
SUT VICRYL 0 AB UR-6 (SUTURE) ×4 IMPLANT
SUT VICRYL AB 3-0 FS1 BRD 27IN (SUTURE) ×8 IMPLANT
SYR 10ML LL (SYRINGE) ×4 IMPLANT
SYR 30ML LL (SYRINGE) IMPLANT
SYR BULB IRRIG 60ML STRL (SYRINGE) ×4 IMPLANT
TOWEL OR 17X26 4PK STRL BLUE (TOWEL DISPOSABLE) ×8 IMPLANT
TRAY FOLEY W/METER SILVER 16FR (SET/KITS/TRAYS/PACK) IMPLANT
TROCAR BLUNT TIP 12MM OMST12BT (TROCAR) ×4 IMPLANT
TROCAR VERSASTEP PLUS 12MM (TROCAR) ×4 IMPLANT
TROCAR VERSASTEP PLUS 5MM (TROCAR) ×8 IMPLANT
TUBING INSUF HEATED (TUBING) ×4 IMPLANT

## 2017-03-14 NOTE — Anesthesia Procedure Notes (Signed)
Anesthesia Regional Block: TAP block   Pre-Anesthetic Checklist: ,, timeout performed, Correct Patient, Correct Site, Correct Laterality, Correct Procedure, Correct Position, site marked, Risks and benefits discussed,  Surgical consent,  Pre-op evaluation,  At surgeon's request and post-op pain management  Laterality: Left and Right  Prep: chloraprep       Needles:  Injection technique: Single-shot  Needle Type: Stimiplex     Needle Length: 10cm  Needle Gauge: 21     Additional Needles:   Procedures:,,,, ultrasound used (permanent image in chart),,,,  Narrative:  Start time: 03/14/2017 10:50 AM End time: 03/14/2017 11:01 AM Injection made incrementally with aspirations every 5 mL.  Performed by: Personally  Anesthesiologist: Emmie Niemann, MD  Additional Notes: Functioning IV was confirmed and monitors were applied.  A Stimuplex needle was used. Sterile prep and drape,hand hygiene and sterile gloves were used.  Negative aspiration and negative test dose prior to incremental administration of local anesthetic. The patient tolerated the procedure well.

## 2017-03-14 NOTE — Interval H&P Note (Signed)
History and Physical Interval Note:  03/14/2017 7:06 AM  Katie Hill  has presented today for surgery, with the diagnosis of ovarian cancer  The various methods of treatment have been discussed with the patient and family. After consideration of risks, benefits and other options for treatment, the patient has consented to  Procedure(s): LAPAROSCOPY OPERATIVE (N/A) HYSTERECTOMY ABDOMINAL WITH SALPINGO-OOPHORECTOMY (N/A) LYMPH NODE DISSECTION (N/A) OMENTECTOMY (N/A) LAPAROTOMY (N/A) as a surgical intervention .  The patient's history has been reviewed, patient examined, no change in status, stable for surgery.  I have reviewed the patient's chart and labs.  Questions were answered to the patient's satisfaction.     Mellody Drown

## 2017-03-14 NOTE — Anesthesia Preprocedure Evaluation (Signed)
Anesthesia Evaluation  Patient identified by MRN, date of birth, ID band Patient awake    Reviewed: Allergy & Precautions, NPO status , Patient's Chart, lab work & pertinent test results  History of Anesthesia Complications Negative for: history of anesthetic complications  Airway Mallampati: III       Dental   Pulmonary neg sleep apnea, neg COPD,           Cardiovascular (-) hypertension(-) Past MI and (-) CHF + dysrhythmias (occassional palpatations) (-) Valvular Problems/Murmurs     Neuro/Psych neg Seizures    GI/Hepatic Neg liver ROS, neg GERD  ,  Endo/Other  neg diabetes  Renal/GU negative Renal ROS     Musculoskeletal   Abdominal   Peds  Hematology   Anesthesia Other Findings   Reproductive/Obstetrics                             Anesthesia Physical Anesthesia Plan  ASA: III  Anesthesia Plan: General   Post-op Pain Management:    Induction: Intravenous  PONV Risk Score and Plan: 3 and Dexamethasone, Ondansetron, Midazolam and Treatment may vary due to age or medical condition  Airway Management Planned: Oral ETT  Additional Equipment:   Intra-op Plan:   Post-operative Plan:   Informed Consent: I have reviewed the patients History and Physical, chart, labs and discussed the procedure including the risks, benefits and alternatives for the proposed anesthesia with the patient or authorized representative who has indicated his/her understanding and acceptance.     Plan Discussed with:   Anesthesia Plan Comments:         Anesthesia Quick Evaluation

## 2017-03-14 NOTE — Anesthesia Post-op Follow-up Note (Signed)
Anesthesia QCDR form completed.        

## 2017-03-14 NOTE — Progress Notes (Signed)
   Subjective: Patient reports some mild nausea, no emesis.  Tolerating clears.   Pain appropriate well-controlled on current analgesic regimen.  Objective: Vital signs in last 24 hours: Temp:  [97 F (36.1 C)-98.4 F (36.9 C)] 97.9 F (36.6 C) (12/05 1641) Pulse Rate:  [76-98] 82 (12/05 1641) Resp:  [11-18] 16 (12/05 1641) BP: (99-130)/(55-79) 110/60 (12/05 1641) SpO2:  [96 %-100 %] 100 % (12/05 1641) Weight:  [117 lb (53.1 kg)] 117 lb (53.1 kg) (12/05 0617)    Intake/Output from previous day: No intake/output data recorded.  Physical Examination: Gen: NAD HEENT: normocephalic, anicteric Pulmonary: no increased work of breathing Abdomen: Soft, non-distended, tender around incision, opsite C/D/I staple line ExtL No edema SCD's  Labs: Results for orders placed or performed during the hospital encounter of 03/14/17 (from the past 72 hour(s))  ABO/Rh     Status: None   Collection Time: 03/14/17  7:20 AM  Result Value Ref Range   ABO/RH(D) O POS   Prepare RBC (crossmatch)     Status: None   Collection Time: 03/14/17  9:36 AM  Result Value Ref Range   Order Confirmation ORDER PROCESSED BY BLOOD BANK      Assessment:  66 y.o. s/p Day of Surgery Procedure(s) (LRB): LAPAROSCOPY OPERATIVE (N/A) HYSTERECTOMY ABDOMINAL WITH SALPINGO-OOPHORECTOMY (Bilateral) LYMPH NODE DISSECTION (N/A) OMENTECTOMY (N/A) LAPAROTOMY (N/A) : stable  Plan: 1) Pain: continue po percocet prn, morphine prn - add simethicone - add abdominal binder - add zofran prn, phenergan suppository prn for nausea  2) Heme: postop CBC in AM  3) FEN: Clears, D5 1/2NS at 134mL/hr, CMP in AM  4) Prophylaxis: intermittent pneumatic compression boots and lovenox to start in AM.  5) Disposition: Pending return of bowl function  Malachy Mood 03/14/2017, 5:48 PM

## 2017-03-14 NOTE — Anesthesia Postprocedure Evaluation (Signed)
Anesthesia Post Note  Patient: Katie Hill  Procedure(s) Performed: LAPAROSCOPY OPERATIVE (N/A Abdomen) HYSTERECTOMY ABDOMINAL WITH SALPINGO-OOPHORECTOMY (Bilateral Abdomen) LYMPH NODE DISSECTION (N/A Abdomen) OMENTECTOMY (N/A Abdomen) LAPAROTOMY (N/A Abdomen)  Patient location during evaluation: PACU Anesthesia Type: General Level of consciousness: awake and alert Pain management: pain level controlled Vital Signs Assessment: post-procedure vital signs reviewed and stable Respiratory status: spontaneous breathing and respiratory function stable Cardiovascular status: stable Anesthetic complications: no     Last Vitals:  Vitals:   03/14/17 0617 03/14/17 1112  BP: 130/75 102/62  Pulse: 98 78  Resp: 17 11  Temp: (!) 36.1 C (!) 36.1 C  SpO2: 100% 100%    Last Pain:  Vitals:   03/14/17 1112  TempSrc:   PainSc: Asleep                 KEPHART,WILLIAM K

## 2017-03-14 NOTE — Anesthesia Procedure Notes (Signed)
Procedure Name: Intubation Date/Time: 03/14/2017 7:45 AM Performed by: Eben Burow, CRNA Pre-anesthesia Checklist: Patient identified, Emergency Drugs available, Suction available, Patient being monitored and Timeout performed Patient Re-evaluated:Patient Re-evaluated prior to induction Oxygen Delivery Method: Circle system utilized Preoxygenation: Pre-oxygenation with 100% oxygen Induction Type: IV induction Ventilation: Mask ventilation without difficulty Laryngoscope Size: Miller and 2 Grade View: Grade I Tube type: Oral Tube size: 7.0 mm Number of attempts: 1 Airway Equipment and Method: Stylet Placement Confirmation: ETT inserted through vocal cords under direct vision,  positive ETCO2 and breath sounds checked- equal and bilateral Secured at: 21 cm Tube secured with: Tape Dental Injury: Teeth and Oropharynx as per pre-operative assessment

## 2017-03-14 NOTE — Transfer of Care (Signed)
Immediate Anesthesia Transfer of Care Note  Patient: Sorina Derrig  Procedure(s) Performed: LAPAROSCOPY OPERATIVE (N/A Abdomen) HYSTERECTOMY ABDOMINAL WITH SALPINGO-OOPHORECTOMY (Bilateral Abdomen) LYMPH NODE DISSECTION (N/A Abdomen) OMENTECTOMY (N/A Abdomen) LAPAROTOMY (N/A Abdomen)  Patient Location: PACU  Anesthesia Type:General  Level of Consciousness: drowsy and patient cooperative  Airway & Oxygen Therapy: Patient Spontanous Breathing and Patient connected to face mask oxygen  Post-op Assessment: Report given to RN and Post -op Vital signs reviewed and stable  Post vital signs: Reviewed and stable  Last Vitals:  Vitals:   03/14/17 0617 03/14/17 1112  BP: 130/75   Pulse: 98   Resp: 17   Temp: (!) 36.1 C (!) (P) 36.1 C  SpO2: 100%     Last Pain:  Vitals:   03/14/17 0617  TempSrc: Tympanic  PainSc: 0-No pain         Complications: No apparent anesthesia complications

## 2017-03-14 NOTE — Op Note (Signed)
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  CONDITION: stable  INDICATIONS: Patient with advanced high grade epithelial ovarian cancer s/p 4 cycles of chemotherapy with excellent response for interval debulking.  FINDINGS: There was a 5 cm tumor in the right ovary that was adherent to the uterus and peritoneum.  Left adnexa and uterus normal, but also involved with adhesions.  Upper abdomen normal, but there was a 4 cm node over the IVC.  At the end of surgery there was no residual disease.   PROCEDURE IN DETAIL: After informed consent was obtained, the patient was taken to the operating room where general endotracheal anesthesia was performed without difficulty. The patient was positioned in the dorsal lithotomy position in [place] with her arms to the side.  She was prepped and draped in normal sterile fashion. Time-out was performed and a Foley catheter was placed into the bladder. A Hasson trochar was placed just below the umbilicus.  After inspection of the peritoneal cavity decision was made to convert to laparotomy, as she was amenable to complete debulking.  A vertical midline incision was made from the symphysis to above the umbilicus and carried down to the underlying fascia. The fascia was incised in the midline, and the peritoneum was then entered. After the incision was  extended the operative findings noted above were identified.   At this point attention was paid to the omentum were an omentectomy was performed to the stomach using the Ligasure. All the pedicles were hemostatic and the omentum was sent to pathology.   The bowel was freed up and placed up into the upper abdomen, packed away and a Bookwalter abdominal retractor was then placed. Round ligaments were then divided on each side with electrocautery and the retroperitoneal space was opened bilaterally. The ureters were identified and preserved. The infundibulopelvic ligaments were skeletonized, divided, ligated with the Ligasure impact. The bladder flap was created with electrocautery and the bladder was dissected down off the lower uterine segment and cervix. The uterine arteries were then skeletonized bilaterally and transected with the Ligasure. The cardinal ligaments were then clamped cut and divided and suture ligated as well with Vicryl. The vaginal angles were then clamped with hysterectomy clamps, cut and suture ligated and tagged. The specimen was then amputated from the vagina using scissors and the vaginal walls were grasped. The vaginal cuff was then closed with 0 Vicryl suture in a figure-of-eight fashion with careful attention to include the vaginal cuff angles and the vaginal mucosa within the closure and avoid the bladder.   We then extended the peritoneal incision over the root of the small bowel mesentary and the large precaval node was carefully dissected from the IVC and right common iliac artery and aorta using Bovie and Clips.    After the abdomen was irrigated and all pedicles felt to be hemostatic the laparotomy packs were removed and the fascia was then reapproximated in a running  mass abdominal wall closure using 1 loop PDS. The subcutaneous space was then irrigated, and subdermal sutures were place to reapproximate the incision the skin was closed with staples. A dressing was placed in  the operating room. The patient tolerated the procedure well. Sponge, lap, needle and instrument counts were correct x2 at the end of the procedure.   Antibiotics: 2 gm Ancef pre op.  VTE prophylaxis: was ordered perioperatively with SCDs and Lovonox.  Mellody Drown, MD

## 2017-03-15 ENCOUNTER — Encounter: Payer: Self-pay | Admitting: Obstetrics and Gynecology

## 2017-03-15 DIAGNOSIS — D62 Acute posthemorrhagic anemia: Secondary | ICD-10-CM

## 2017-03-15 LAB — CBC
HCT: 25.6 % — ABNORMAL LOW (ref 35.0–47.0)
HEMOGLOBIN: 8.6 g/dL — AB (ref 12.0–16.0)
MCH: 31.4 pg (ref 26.0–34.0)
MCHC: 33.6 g/dL (ref 32.0–36.0)
MCV: 93.6 fL (ref 80.0–100.0)
Platelets: 205 10*3/uL (ref 150–440)
RBC: 2.73 MIL/uL — AB (ref 3.80–5.20)
RDW: 17.9 % — ABNORMAL HIGH (ref 11.5–14.5)
WBC: 6.4 10*3/uL (ref 3.6–11.0)

## 2017-03-15 LAB — BASIC METABOLIC PANEL
ANION GAP: 9 (ref 5–15)
BUN: 7 mg/dL (ref 6–20)
CHLORIDE: 98 mmol/L — AB (ref 101–111)
CO2: 21 mmol/L — ABNORMAL LOW (ref 22–32)
Calcium: 8.9 mg/dL (ref 8.9–10.3)
Creatinine, Ser: 0.47 mg/dL (ref 0.44–1.00)
GFR calc Af Amer: >60 mL/min (ref >60)
GFR calc non Af Amer: >60 mL/min (ref >60)
GLUCOSE: 138 mg/dL — AB (ref 65–99)
POTASSIUM: 3.6 mmol/L (ref 3.5–5.1)
Sodium: 128 mmol/L — ABNORMAL LOW (ref 135–145)

## 2017-03-15 NOTE — Progress Notes (Signed)
   Subjective: Patient reports no passage of flatus, some nausea, one episode of emesis. Pain is well-controlled on current analgesic regimen..  No fevers or chills  Objective: Vital signs in last 24 hours: Temp:  [97 F (36.1 C)-98.5 F (36.9 C)] 98.3 F (36.8 C) (12/06 0806) Pulse Rate:  [66-85] 78 (12/06 0806) Resp:  [11-20] 18 (12/06 0806) BP: (99-110)/(52-79) 105/52 (12/06 0806) SpO2:  [96 %-100 %] 97 % (12/06 0806)    Intake/Output from previous day: 12/05 0701 - 12/06 0700 In: 1960 [P.O.:660; I.V.:1300] Out: 1740 [Urine:1440; Blood:300]  Physical Examination: Gen: NAD Pulmonary: no increased work of breathing Abdomen: Hypoactive BS, soft, mildly distended and tympanic, appropriately tender, incision D/C/I staple line Ext: no edema  Labs: Results for orders placed or performed during the hospital encounter of 03/14/17 (from the past 72 hour(s))  ABO/Rh     Status: None   Collection Time: 03/14/17  7:20 AM  Result Value Ref Range   ABO/RH(D) O POS   Prepare RBC (crossmatch)     Status: None   Collection Time: 03/14/17  9:36 AM  Result Value Ref Range   Order Confirmation ORDER PROCESSED BY BLOOD BANK   CBC     Status: Abnormal   Collection Time: 03/15/17  4:08 AM  Result Value Ref Range   WBC 6.4 3.6 - 11.0 K/uL   RBC 2.73 (L) 3.80 - 5.20 MIL/uL   Hemoglobin 8.6 (L) 12.0 - 16.0 g/dL   HCT 25.6 (L) 35.0 - 47.0 %   MCV 93.6 80.0 - 100.0 fL   MCH 31.4 26.0 - 34.0 pg   MCHC 33.6 32.0 - 36.0 g/dL   RDW 17.9 (H) 11.5 - 14.5 %   Platelets 205 150 - 440 K/uL  Basic metabolic panel     Status: Abnormal   Collection Time: 03/15/17  4:08 AM  Result Value Ref Range   Sodium 128 (L) 135 - 145 mmol/L   Potassium 3.6 3.5 - 5.1 mmol/L   Chloride 98 (L) 101 - 111 mmol/L   CO2 21 (L) 22 - 32 mmol/L   Glucose, Bld 138 (H) 65 - 99 mg/dL   BUN 7 6 - 20 mg/dL   Creatinine, Ser 0.47 0.44 - 1.00 mg/dL   Calcium 8.9 8.9 - 10.3 mg/dL   GFR calc non Af Amer >60 >60 mL/min   GFR  calc Af Amer >60 >60 mL/min    Comment: (NOTE) The eGFR has been calculated using the CKD EPI equation. This calculation has not been validated in all clinical situations. eGFR's persistently <60 mL/min signify possible Chronic Kidney Disease.    Anion gap 9 5 - 15     Assessment:  66 y.o. s/p 1 Day Post-Op Procedure(s) (LRB): LAPAROSCOPY OPERATIVE (N/A) HYSTERECTOMY ABDOMINAL WITH SALPINGO-OOPHORECTOMY (Bilateral) LYMPH NODE DISSECTION (N/A) OMENTECTOMY (N/A) LAPAROTOMY (N/A) : stable  Plan: 1) Pain: well controlled on current regimen  2) Heme: monitor CBC, H&H currently at 8.6 & 25.6 acute blood loss on chronic anemia.  Asymptomatic  3) FEN: Clear liquids until return of bowl function  4) Prophylaxis: pharmacologic prophylaxis (with any of the following: lovenox '40mg'$  daily) and intermittent pneumatic compression boots.  5) Disposition: pending postoperative course  Malachy Mood 03/15/2017, 10:17 AM

## 2017-03-16 DIAGNOSIS — R1110 Vomiting, unspecified: Secondary | ICD-10-CM

## 2017-03-16 LAB — BPAM RBC
BLOOD PRODUCT EXPIRATION DATE: 201812202359
Blood Product Expiration Date: 201812202359
UNIT TYPE AND RH: 5100
UNIT TYPE AND RH: 5100

## 2017-03-16 LAB — CBC
HEMATOCRIT: 21.8 % — AB (ref 35.0–47.0)
HEMOGLOBIN: 7.8 g/dL — AB (ref 12.0–16.0)
MCH: 32.7 pg (ref 26.0–34.0)
MCHC: 35.6 g/dL (ref 32.0–36.0)
MCV: 92 fL (ref 80.0–100.0)
Platelets: 175 10*3/uL (ref 150–440)
RBC: 2.37 MIL/uL — AB (ref 3.80–5.20)
RDW: 17.4 % — ABNORMAL HIGH (ref 11.5–14.5)
WBC: 5.5 10*3/uL (ref 3.6–11.0)

## 2017-03-16 LAB — TYPE AND SCREEN
ABO/RH(D): O POS
ANTIBODY SCREEN: NEGATIVE
UNIT DIVISION: 0
Unit division: 0

## 2017-03-16 NOTE — Progress Notes (Signed)
Subjective: Patient reports nausea.  Pain is well-controlled on current analgesic regimen..  Tolerating po: No: still having nausea had another episode of emesis overnight    Objective: Vital signs in last 24 hours: Temp:  [97.7 F (36.5 C)-98.6 F (37 C)] 98.4 F (36.9 C) (12/07 0820) Pulse Rate:  [74-87] 79 (12/07 0820) Resp:  [16-20] 16 (12/07 0820) BP: (90-107)/(49-61) 101/55 (12/07 0820) SpO2:  [89 %-98 %] 92 % (12/07 0820)    Intake/Output from previous day: 12/06 0701 - 12/07 0700 In: 4031 [P.O.:390; I.V.:3641] Out: 650 [Urine:450; Emesis/NG output:200]  Physical Examination: OBGyn Exam  Labs: Results for orders placed or performed during the hospital encounter of 03/14/17 (from the past 72 hour(s))  ABO/Rh     Status: None   Collection Time: 03/14/17  7:20 AM  Result Value Ref Range   ABO/RH(D) O POS   Prepare RBC (crossmatch)     Status: None   Collection Time: 03/14/17  9:36 AM  Result Value Ref Range   Order Confirmation ORDER PROCESSED BY BLOOD BANK   CBC     Status: Abnormal   Collection Time: 03/15/17  4:08 AM  Result Value Ref Range   WBC 6.4 3.6 - 11.0 K/uL   RBC 2.73 (L) 3.80 - 5.20 MIL/uL   Hemoglobin 8.6 (L) 12.0 - 16.0 g/dL   HCT 25.6 (L) 35.0 - 47.0 %   MCV 93.6 80.0 - 100.0 fL   MCH 31.4 26.0 - 34.0 pg   MCHC 33.6 32.0 - 36.0 g/dL   RDW 17.9 (H) 11.5 - 14.5 %   Platelets 205 150 - 440 K/uL  Basic metabolic panel     Status: Abnormal   Collection Time: 03/15/17  4:08 AM  Result Value Ref Range   Sodium 128 (L) 135 - 145 mmol/L   Potassium 3.6 3.5 - 5.1 mmol/L   Chloride 98 (L) 101 - 111 mmol/L   CO2 21 (L) 22 - 32 mmol/L   Glucose, Bld 138 (H) 65 - 99 mg/dL   BUN 7 6 - 20 mg/dL   Creatinine, Ser 0.47 0.44 - 1.00 mg/dL   Calcium 8.9 8.9 - 10.3 mg/dL   GFR calc non Af Amer >60 >60 mL/min   GFR calc Af Amer >60 >60 mL/min    Comment: (NOTE) The eGFR has been calculated using the CKD EPI equation. This calculation has not been validated  in all clinical situations. eGFR's persistently <60 mL/min signify possible Chronic Kidney Disease.    Anion gap 9 5 - 15  CBC     Status: Abnormal   Collection Time: 03/16/17  6:30 AM  Result Value Ref Range   WBC 5.5 3.6 - 11.0 K/uL   RBC 2.37 (L) 3.80 - 5.20 MIL/uL   Hemoglobin 7.8 (L) 12.0 - 16.0 g/dL   HCT 21.8 (L) 35.0 - 47.0 %   MCV 92.0 80.0 - 100.0 fL   MCH 32.7 26.0 - 34.0 pg   MCHC 35.6 32.0 - 36.0 g/dL   RDW 17.4 (H) 11.5 - 14.5 %   Platelets 175 150 - 440 K/uL     Assessment:  66 y.o. s/p 2 Days Post-Op Procedure(s) (LRB): LAPAROSCOPY OPERATIVE (N/A) HYSTERECTOMY ABDOMINAL WITH SALPINGO-OOPHORECTOMY (Bilateral) LYMPH NODE DISSECTION (N/A) OMENTECTOMY (N/A) LAPAROTOMY (N/A) : stable  Plan: 1) Pain: continue percocet, ibuprofen, with prn morphine pushes  2) Heme:Anemia: hemodynamically stable will recheck in AM.  Discussed transfusion if drops below hgb of 7.0  3) FEN: Clear liquids  -  slow return of bowl function  4) Prophylaxis: pharmacologic prophylaxis (with any of the following: loveno 89m daily) and intermittent pneumatic compression boots.  5) Disposition: pending return of bowl function  AMalachy Mood12/10/2016, 9:54 AM

## 2017-03-17 DIAGNOSIS — R1110 Vomiting, unspecified: Secondary | ICD-10-CM

## 2017-03-17 DIAGNOSIS — C561 Malignant neoplasm of right ovary: Secondary | ICD-10-CM

## 2017-03-17 DIAGNOSIS — D62 Acute posthemorrhagic anemia: Secondary | ICD-10-CM

## 2017-03-17 DIAGNOSIS — R590 Localized enlarged lymph nodes: Secondary | ICD-10-CM

## 2017-03-17 LAB — PREPARE RBC (CROSSMATCH)

## 2017-03-17 LAB — CBC
HCT: 24.3 % — ABNORMAL LOW (ref 35.0–47.0)
HEMOGLOBIN: 8.3 g/dL — AB (ref 12.0–16.0)
MCH: 31.7 pg (ref 26.0–34.0)
MCHC: 34.1 g/dL (ref 32.0–36.0)
MCV: 93 fL (ref 80.0–100.0)
Platelets: 183 10*3/uL (ref 150–440)
RBC: 2.61 MIL/uL — AB (ref 3.80–5.20)
RDW: 17.1 % — ABNORMAL HIGH (ref 11.5–14.5)
WBC: 4 10*3/uL (ref 3.6–11.0)

## 2017-03-17 MED ORDER — ENOXAPARIN SODIUM 40 MG/0.4ML ~~LOC~~ SOLN: 40.0000 mg | SUBCUTANEOUS | 0 refills | Status: DC

## 2017-03-17 MED ORDER — IBUPROFEN 600 MG PO TABS: 600.0000 mg | ORAL_TABLET | Freq: Four times a day (QID) | ORAL | 3 refills | Status: DC | PRN

## 2017-03-17 MED ORDER — OXYCODONE-ACETAMINOPHEN 5-325 MG PO TABS: 1.0000 | ORAL_TABLET | ORAL | 0 refills | Status: DC | PRN

## 2017-03-17 MED ORDER — ONDANSETRON HCL 4 MG/2ML IJ SOLN: 4.0000 mg | Freq: Four times a day (QID) | INTRAMUSCULAR | 0 refills | Status: DC | PRN

## 2017-03-17 NOTE — Progress Notes (Signed)
Pt discharged to home with husband. Reviewed D/C instructions and Rx's, pt verbalized understanding. Pt discharged via W/C. Will schedule f/u appts on Monday.

## 2017-03-17 NOTE — Discharge Summary (Signed)
Physician Discharge Summary  Patient ID: Katie Hill MRN: [id number] DOB/AGE: [birth year] 65 y.o.  Admit date: 03/14/2017 Discharge date: 03/17/2017  Admission Diagnoses: Ovarian cancer  Discharge Diagnoses:  Active Problems:   S/P total abdominal hysterectomy and bilateral salpingo-oophorectomy   Discharged Condition: good  Hospital Course: 66 year old with ovarian cancer presenting for interval debulking following neoadjuvant chemotherapy with good response.  She underwent uncomplicated total abdominal hysterectomy, bilateral salpingo-oophorectomy, omentectomy, and removal of enlarged para-aortic lymph node.  Initial postoperative course was notable for slow return of bowl function.  She began passing flatus on afternoon of postoperative day 2, has her diet advanced.  She was doing tolerating general diet, ambulating, voiding, hemodynamically stable at the time of discharge on POD3.  She will continue of prophylactic lovenox for 4 week following her procedure.     Consults: None  Significant Diagnostic Studies:  Results for orders placed or performed during the hospital encounter of 03/14/17 (from the past 72 hour(s))  Prepare RBC (crossmatch)     Status: None   Collection Time: 03/14/17  9:36 AM  Result Value Ref Range   Order Confirmation ORDER PROCESSED BY BLOOD BANK   CBC     Status: Abnormal   Collection Time: 03/15/17  4:08 AM  Result Value Ref Range   WBC 6.4 3.6 - 11.0 K/uL   RBC 2.73 (L) 3.80 - 5.20 MIL/uL   Hemoglobin 8.6 (L) 12.0 - 16.0 g/dL   HCT 25.6 (L) 35.0 - 47.0 %   MCV 93.6 80.0 - 100.0 fL   MCH 31.4 26.0 - 34.0 pg   MCHC 33.6 32.0 - 36.0 g/dL   RDW 17.9 (H) 11.5 - 14.5 %   Platelets 205 150 - 440 K/uL  Basic metabolic panel     Status: Abnormal   Collection Time: 03/15/17  4:08 AM  Result Value Ref Range   Sodium 128 (L) 135 - 145 mmol/L   Potassium 3.6 3.5 - 5.1 mmol/L   Chloride 98 (L) 101 - 111 mmol/L   CO2 21 (L) 22 - 32 mmol/L   Glucose,  Bld 138 (H) 65 - 99 mg/dL   BUN 7 6 - 20 mg/dL   Creatinine, Ser 0.47 0.44 - 1.00 mg/dL   Calcium 8.9 8.9 - 10.3 mg/dL   GFR calc non Af Amer >60 >60 mL/min   GFR calc Af Amer >60 >60 mL/min    Comment: (NOTE) The eGFR has been calculated using the CKD EPI equation. This calculation has not been validated in all clinical situations. eGFR's persistently <60 mL/min signify possible Chronic Kidney Disease.    Anion gap 9 5 - 15  CBC     Status: Abnormal   Collection Time: 03/16/17  6:30 AM  Result Value Ref Range   WBC 5.5 3.6 - 11.0 K/uL   RBC 2.37 (L) 3.80 - 5.20 MIL/uL   Hemoglobin 7.8 (L) 12.0 - 16.0 g/dL   HCT 21.8 (L) 35.0 - 47.0 %   MCV 92.0 80.0 - 100.0 fL   MCH 32.7 26.0 - 34.0 pg   MCHC 35.6 32.0 - 36.0 g/dL   RDW 17.4 (H) 11.5 - 14.5 %   Platelets 175 150 - 440 K/uL     Treatments: IV hydration, surgery  Discharge Exam: Blood pressure (!) 116/53, pulse 84, temperature (!) 97.3 F (36.3 C), temperature source Oral, resp. rate 18, height '5\' 8"'  (1.727 m), weight 117 lb (53.1 kg), SpO2 96 %. General appearance: alert, appears stated  age and no distress Resp: clear to auscultation bilaterally GI: NABS, soft, minimally tender around incision, non-distended Extremities: extremities normal, atraumatic, no cyanosis or edema Incision/Wound: D/C/I staple line  Disposition: 01-Home or Self Care  Discharge Instructions    Call MD for:   Complete by:  As directed    Heavy vaginal bleeding greater than 1 pad an hour   Call MD for:  difficulty breathing, headache or visual disturbances   Complete by:  As directed    Call MD for:  extreme fatigue   Complete by:  As directed    Call MD for:  hives   Complete by:  As directed    Call MD for:  persistant dizziness or light-headedness   Complete by:  As directed    Call MD for:  persistant nausea and vomiting   Complete by:  As directed    Call MD for:  redness, tenderness, or signs of infection (pain, swelling, redness,  odor or green/yellow discharge around incision site)   Complete by:  As directed    Call MD for:  severe uncontrolled pain   Complete by:  As directed    Call MD for:  temperature >100.4   Complete by:  As directed    Diet general   Complete by:  As directed    Discharge wound care:   Complete by:  As directed    You may apply a light dressing for minor discharge from the incision or to keep waistbands of clothing from rubbing.  You may also have been discharge with a clear dressing in which case this will be removed at your postoperative clinic visit.  You may shower, use soap on your incision.  Avoid baths or soaking the incision in the first 6 weeks following your surgery..   Driving restriction   Complete by:  As directed    Avoid driving for at least 2 weeks or while taking prescription narcotics.   Lifting restrictions   Complete by:  As directed    Weight restriction of 10lbs for 6 weeks.   Sexual acrtivity   Complete by:  As directed    No intercourse, tampons, or anything vaginally for 6 weeks     Allergies as of 03/17/2017      Reactions   Omeprazole Rash      Medication List    STOP taking these medications   oxyCODONE 5 MG immediate release tablet Commonly known as:  Oxy IR/ROXICODONE     TAKE these medications   calcium carbonate 500 MG chewable tablet Commonly known as:  TUMS - dosed in mg elemental calcium Chew 1 tablet by mouth as needed for indigestion or heartburn.   dexamethasone 4 MG tablet Commonly known as:  DECADRON Take 5 tablets (20 mg total) by mouth daily. Take 20 mg daily for 2 days prior to your chemotherapy and take 3m daily for 2 days after chemotherapy. What changed:    when to take this  additional instructions   enoxaparin 40 MG/0.4ML injection Commonly known as:  LOVENOX Inject 0.4 mLs (40 mg total) into the skin daily.   hydrocortisone 2.5 % cream Apply topically 2 (two) times daily.   ibuprofen 600 MG tablet Commonly known  as:  ADVIL,MOTRIN Take 1 tablet (600 mg total) by mouth every 6 (six) hours as needed.   lidocaine-prilocaine cream Commonly known as:  EMLA Apply 1 application topically as needed. Apply small amount to port site at least 1 hour prior to it  being accessed, cover with plastic wrap   ondansetron 4 MG tablet Commonly known as:  ZOFRAN Take 1 tablet (4 mg total) by mouth daily as needed for nausea or vomiting. What changed:  Another medication with the same name was added. Make sure you understand how and when to take each.   ondansetron 4 MG/2ML Soln injection Commonly known as:  ZOFRAN Inject 2 mLs (4 mg total) into the vein every 6 (six) hours as needed for nausea or vomiting. What changed:  You were already taking a medication with the same name, and this prescription was added. Make sure you understand how and when to take each.   oxyCODONE-acetaminophen 5-325 MG tablet Commonly known as:  PERCOCET/ROXICET Take 1-2 tablets by mouth every 4 (four) hours as needed (moderate to severe pain (when tolerating fluids)).   REDNESS RELIEVER EYE DROPS OP Place 1 drop into both eyes as needed (for red eyes).   senna 8.6 MG Tabs tablet Commonly known as:  SENOKOT Take 2 tablets (17.2 mg total) by mouth daily. Hold if loose stool. What changed:    how much to take  when to take this  additional instructions            Discharge Care Instructions  (From admission, onward)        Start     Ordered   03/17/17 0000  Discharge wound care:    Comments:  You may apply a light dressing for minor discharge from the incision or to keep waistbands of clothing from rubbing.  You may also have been discharge with a clear dressing in which case this will be removed at your postoperative clinic visit.  You may shower, use soap on your incision.  Avoid baths or soaking the incision in the first 6 weeks following your surgery..   03/17/17 3568     Follow-up Information    Malachy Mood,  MD Follow up in 1 week(s).   Specialty:  Obstetrics and Gynecology Why:  incision check Contact information: [place] [address] [phone]        Mellody Drown, MD Follow up in 2 week(s).   Specialty:  Obstetrics and Gynecology Contact information: [facility] [address] [place] [address] [phone]           Signed: Malachy Mood 03/17/2017, 7:36 AM

## 2017-03-19 LAB — SURGICAL PATHOLOGY

## 2017-03-21 ENCOUNTER — Telehealth: Payer: Self-pay | Admitting: Obstetrics and Gynecology

## 2017-03-21 ENCOUNTER — Other Ambulatory Visit: Payer: Self-pay

## 2017-03-21 DIAGNOSIS — C569 Malignant neoplasm of unspecified ovary: Secondary | ICD-10-CM

## 2017-03-21 DIAGNOSIS — R1900 Intra-abdominal and pelvic swelling, mass and lump, unspecified site: Secondary | ICD-10-CM

## 2017-03-21 DIAGNOSIS — E860 Dehydration: Secondary | ICD-10-CM

## 2017-03-21 DIAGNOSIS — E871 Hypo-osmolality and hyponatremia: Secondary | ICD-10-CM

## 2017-03-21 MED ORDER — ONDANSETRON HCL 4 MG PO TABS: 4.0000 mg | ORAL_TABLET | Freq: Every day | ORAL | 0 refills | Status: AC | PRN

## 2017-03-21 NOTE — Telephone Encounter (Signed)
Pharmacy called, they need the inj changed to the quick dissolving tablets. Please do today.

## 2017-03-21 NOTE — Telephone Encounter (Signed)
I resent the right Zofran, receipt confirmation was received by pharmacy. KJ CMA

## 2017-03-23 ENCOUNTER — Telehealth: Payer: Self-pay

## 2017-03-23 ENCOUNTER — Other Ambulatory Visit: Payer: Self-pay | Admitting: Oncology

## 2017-03-23 MED ORDER — DEXAMETHASONE 4 MG PO TABS: 20.0000 mg | ORAL_TABLET | Freq: Every day | ORAL | 3 refills | Status: DC

## 2017-03-23 NOTE — Telephone Encounter (Signed)
Received voicemail from Mr. Bartosik. Ms. Krizek is scheduled to resume her chemotherapy 12/19. SHe has taken dexamethasone in the past. If she needs this she will need more called in. Please call him and advise regarding. Oncology Nurse Navigator Documentation  Navigator Location: [facility] (03/23/17 1200)   )Navigator Encounter Type: Telephone (03/23/17 1200)                                                    Time Spent with Patient: 15 (03/23/17 1200)

## 2017-03-27 ENCOUNTER — Other Ambulatory Visit: Payer: Self-pay | Admitting: *Deleted

## 2017-03-27 MED ORDER — DEXAMETHASONE 4 MG PO TABS: ORAL_TABLET | ORAL | 3 refills | Status: DC

## 2017-03-28 ENCOUNTER — Other Ambulatory Visit: Payer: Self-pay

## 2017-03-28 ENCOUNTER — Inpatient Hospital Stay: Payer: Medicare Other

## 2017-03-28 ENCOUNTER — Inpatient Hospital Stay: Payer: Medicare Other | Attending: Oncology | Admitting: Oncology

## 2017-03-28 ENCOUNTER — Encounter: Payer: Self-pay | Admitting: Oncology

## 2017-03-28 VITALS — BP 101/63 | HR 70 | Resp 20

## 2017-03-28 VITALS — BP 114/77 | HR 71 | Temp 98.0°F | Resp 16 | Wt 115.0 lb

## 2017-03-28 DIAGNOSIS — Z90722 Acquired absence of ovaries, bilateral: Secondary | ICD-10-CM | POA: Insufficient documentation

## 2017-03-28 DIAGNOSIS — Z90710 Acquired absence of both cervix and uterus: Secondary | ICD-10-CM | POA: Diagnosis not present

## 2017-03-28 DIAGNOSIS — C569 Malignant neoplasm of unspecified ovary: Secondary | ICD-10-CM | POA: Diagnosis not present

## 2017-03-28 DIAGNOSIS — N858 Other specified noninflammatory disorders of uterus: Secondary | ICD-10-CM | POA: Diagnosis not present

## 2017-03-28 DIAGNOSIS — D649 Anemia, unspecified: Secondary | ICD-10-CM | POA: Diagnosis not present

## 2017-03-28 DIAGNOSIS — N888 Other specified noninflammatory disorders of cervix uteri: Secondary | ICD-10-CM

## 2017-03-28 DIAGNOSIS — C562 Malignant neoplasm of left ovary: Secondary | ICD-10-CM | POA: Insufficient documentation

## 2017-03-28 DIAGNOSIS — Z5111 Encounter for antineoplastic chemotherapy: Secondary | ICD-10-CM

## 2017-03-28 DIAGNOSIS — Z79899 Other long term (current) drug therapy: Secondary | ICD-10-CM | POA: Insufficient documentation

## 2017-03-28 DIAGNOSIS — Z1379 Encounter for other screening for genetic and chromosomal anomalies: Secondary | ICD-10-CM

## 2017-03-28 HISTORY — DX: Encounter for other screening for genetic and chromosomal anomalies: Z13.79

## 2017-03-28 LAB — CBC WITH DIFFERENTIAL/PLATELET
BASOS ABS: 0 10*3/uL (ref 0–0.1)
Basophils Relative: 1 %
Eosinophils Absolute: 0.5 10*3/uL (ref 0–0.7)
Eosinophils Relative: 10 %
HEMATOCRIT: 25 % — AB (ref 35.0–47.0)
Hemoglobin: 8.5 g/dL — ABNORMAL LOW (ref 12.0–16.0)
LYMPHS PCT: 20 %
Lymphs Abs: 1.1 10*3/uL (ref 1.0–3.6)
MCH: 32.3 pg (ref 26.0–34.0)
MCHC: 33.8 g/dL (ref 32.0–36.0)
MCV: 95.6 fL (ref 80.0–100.0)
Monocytes Absolute: 0.3 10*3/uL (ref 0.2–0.9)
Monocytes Relative: 6 %
NEUTROS ABS: 3.5 10*3/uL (ref 1.4–6.5)
Neutrophils Relative %: 63 %
PLATELETS: 317 10*3/uL (ref 150–440)
RBC: 2.62 MIL/uL — AB (ref 3.80–5.20)
RDW: 15.7 % — ABNORMAL HIGH (ref 11.5–14.5)
WBC: 5.5 10*3/uL (ref 3.6–11.0)

## 2017-03-28 LAB — COMPREHENSIVE METABOLIC PANEL
ALT: 10 U/L — ABNORMAL LOW (ref 14–54)
ANION GAP: 7 (ref 5–15)
AST: 19 U/L (ref 15–41)
Albumin: 3.3 g/dL — ABNORMAL LOW (ref 3.5–5.0)
Alkaline Phosphatase: 62 U/L (ref 38–126)
BILIRUBIN TOTAL: 0.4 mg/dL (ref 0.3–1.2)
BUN: 7 mg/dL (ref 6–20)
CO2: 24 mmol/L (ref 22–32)
Calcium: 9.5 mg/dL (ref 8.9–10.3)
Chloride: 102 mmol/L (ref 101–111)
Creatinine, Ser: 0.57 mg/dL (ref 0.44–1.00)
Glucose, Bld: 84 mg/dL (ref 65–99)
POTASSIUM: 4.2 mmol/L (ref 3.5–5.1)
Sodium: 133 mmol/L — ABNORMAL LOW (ref 135–145)
TOTAL PROTEIN: 6.6 g/dL (ref 6.5–8.1)

## 2017-03-28 MED ORDER — FAMOTIDINE IN NACL 20-0.9 MG/50ML-% IV SOLN
20.0000 mg | Freq: Once | INTRAVENOUS | Status: AC
  Administered 2017-03-28: 20 mg via INTRAVENOUS
  Filled 2017-03-28: qty 50

## 2017-03-28 MED ORDER — DIPHENHYDRAMINE HCL 50 MG/ML IJ SOLN
50.0000 mg | Freq: Once | INTRAMUSCULAR | Status: AC
  Administered 2017-03-28: 50 mg via INTRAVENOUS
  Filled 2017-03-28: qty 1

## 2017-03-28 MED ORDER — HEPARIN SOD (PORK) LOCK FLUSH 100 UNIT/ML IV SOLN: 500.0000 [IU] | Freq: Once | INTRAVENOUS | Status: DC

## 2017-03-28 MED ORDER — SODIUM CHLORIDE 0.9 % IV SOLN
175.0000 mg/m2 | Freq: Once | INTRAVENOUS | Status: AC
  Administered 2017-03-28: 276 mg via INTRAVENOUS
  Filled 2017-03-28: qty 46

## 2017-03-28 MED ORDER — PALONOSETRON HCL INJECTION 0.25 MG/5ML
0.2500 mg | Freq: Once | INTRAVENOUS | Status: AC
  Administered 2017-03-28: 0.25 mg via INTRAVENOUS
  Filled 2017-03-28: qty 5

## 2017-03-28 MED ORDER — SODIUM CHLORIDE 0.9% FLUSH
10.0000 mL | INTRAVENOUS | Status: DC | PRN
  Administered 2017-03-28: 10 mL via INTRAVENOUS
  Filled 2017-03-28: qty 10

## 2017-03-28 MED ORDER — SODIUM CHLORIDE 0.9 % IV SOLN
356.0000 mg | Freq: Once | INTRAVENOUS | Status: AC
  Administered 2017-03-28: 360 mg via INTRAVENOUS
  Filled 2017-03-28: qty 36

## 2017-03-28 MED ORDER — SODIUM CHLORIDE 0.9 % IV SOLN
20.0000 mg | Freq: Once | INTRAVENOUS | Status: AC
  Administered 2017-03-28: 20 mg via INTRAVENOUS
  Filled 2017-03-28: qty 2

## 2017-03-28 MED ORDER — HEPARIN SOD (PORK) LOCK FLUSH 100 UNIT/ML IV SOLN
500.0000 [IU] | Freq: Once | INTRAVENOUS | Status: AC | PRN
  Administered 2017-03-28: 500 [IU]
  Filled 2017-03-28: qty 5

## 2017-03-28 MED ORDER — SODIUM CHLORIDE 0.9 % IV SOLN
Freq: Once | INTRAVENOUS | Status: AC
  Administered 2017-03-28: 10:00:00 via INTRAVENOUS
  Filled 2017-03-28: qty 1000

## 2017-03-28 NOTE — Progress Notes (Signed)
[facility] Follow up visit  Patient Care Team: Patient, [name], RN as Registered Nurse Gae Dry, MD as Referring Physician (Obstetrics and Gynecology)  CHIEF COMPLAINTS/PURPOSE OF Visit Follow up for Treatment of  ovarian cancer.  HISTORY OF PRESENTING ILLNESS: Katie Hill 66 y.o. female presents for follow up of management of ovarian cancer. She is on Neoadjuvant chemotherapy.   On presentation, CT imaging revealed a large pelvic mass and extensive adenopathy. 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 Patient reports healthy at baseline and has not follow-up with any doctor for her life. She is married, G0P0 She denies any fatigue, weight loss, chest pain, cough, shortness of breath, and lower extremity swelling. She noted some urinary symptoms with frequency of urination. She also mentioned that she intermittently pass some mucus in her stool.also some back pain which is new.   INTERVAL HISTORY Patient presents for evaluation prior to post surgery and adjuvant chemotherapy. Patient had debulking surgery on 03/14/2017. She had a laparoscopy with conversion to laparotomy, total hysterectomy, with bilateral salpingo oophorectomy, right aortic lymph node dissection, omentectomy. Today she reports feeling well. Her wound has healed well. She reports some soreness around the incision scar.. She has a good appetite. She feels tired sometimes.   Review of Systems  Constitutional: Positive for fatigue. Negative for appetite change and chills.  HENT:   Negative for hearing loss.   Eyes: Negative for eye problems.  Respiratory: Negative for chest tightness.   Cardiovascular: Negative for chest pain.  Gastrointestinal: Negative for abdominal distention.  Endocrine: Negative for hot flashes.  Genitourinary: Negative for difficulty urinating.    Musculoskeletal: Negative for arthralgias.  Skin: Negative for itching.       Soreness around the incision. No discharge or erythema.  Neurological: Negative for dizziness.  Hematological: Negative for adenopathy.  Psychiatric/Behavioral: The patient is not nervous/anxious.     MEDICAL HISTORY: Past Medical History:  Diagnosis Date  . Dysrhythmia   . High grade ovarian cancer ([place]) 11/20/2016  . Pelvic mass in female     SURGICAL HISTORY: Past Surgical History:  Procedure Laterality Date  . APPENDECTOMY    . LAPAROSCOPY N/A 03/14/2017   Procedure: LAPAROSCOPY OPERATIVE;  Surgeon: Mellody Drown, MD;  Location: [facility];  Service: Gynecology;  Laterality: N/A;  . LAPAROTOMY N/A 03/14/2017   Procedure: LAPAROTOMY;  Surgeon: Mellody Drown, MD;  Location: [facility];  Service: Gynecology;  Laterality: N/A;  . LYMPH NODE DISSECTION N/A 03/14/2017   Procedure: LYMPH NODE DISSECTION;  Surgeon: Mellody Drown, MD;  Location: [facility];  Service: Gynecology;  Laterality: N/A;  . OMENTECTOMY N/A 03/14/2017   Procedure: OMENTECTOMY;  Surgeon: Mellody Drown, MD;  Location: [facility];  Service: Gynecology;  Laterality: N/A;  . PORTA CATH INSERTION N/A 11/27/2016   Procedure: Glori Luis Cath Insertion;  Surgeon: Algernon Huxley, MD;  Location: [facility];  Service: Cardiovascular;  Laterality: N/A;    SOCIAL HISTORY: Social History   Socioeconomic History  . Marital status: Married    Spouse name: Not on file  . Number of children: Not on file  . Years of education: Not on file  . Highest education level: Not on file  Social Needs  . Financial resource strain: Not on file  . Food insecurity - worry: Not on file  . Food insecurity - inability: Not on file  . Transportation needs -  medical: Not on file  . Transportation needs - non-medical: Not on file  Occupational History  . Not on file  Tobacco Use  . Smoking status: Never Smoker  . Smokeless tobacco: Never Used  Substance  and Sexual Activity  . Alcohol use: Yes    Comment: occasional   . Drug use: No  . Sexual activity: Yes    Birth control/protection: Post-menopausal  Other Topics Concern  . Not on file  Social History Narrative  . Not on file    FAMILY HISTORY Family History  Problem Relation Age of Onset  . Throat cancer Cousin   . Throat cancer Cousin     ALLERGIES:  is allergic to omeprazole.  MEDICATIONS:  Current Outpatient Medications  Medication Sig Dispense Refill  . calcium carbonate (TUMS - DOSED IN MG ELEMENTAL CALCIUM) 500 MG chewable tablet Chew 1 tablet by mouth as needed for indigestion or heartburn.    . enoxaparin (LOVENOX) 40 MG/0.4ML injection Inject 0.4 mLs (40 mg total) into the skin daily. 28 Syringe 0  . lidocaine-prilocaine (EMLA) cream Apply 1 application topically as needed. Apply small amount to port site at least 1 hour prior to it being accessed, cover with plastic wrap 30 g 1  . ondansetron (ZOFRAN) 4 MG tablet Take 1 tablet (4 mg total) by mouth daily as needed for nausea or vomiting. 30 tablet 0  . oxyCODONE-acetaminophen (PERCOCET/ROXICET) 5-325 MG tablet Take 1-2 tablets by mouth every 4 (four) hours as needed (moderate to severe pain (when tolerating fluids)). 30 tablet 0  . senna (SENOKOT) 8.6 MG TABS tablet Take 2 tablets (17.2 mg total) by mouth daily. Hold if loose stool. (Patient taking differently: Take 1 tablet by mouth at bedtime. Hold if loose stool.) 120 each 0  . Tetrahydrozoline HCl (REDNESS RELIEVER EYE DROPS OP) Place 1 drop into both eyes as needed (for red eyes).     [name] dexamethasone (DECADRON) 4 MG tablet Take 5 tablets (20 mg) 2 days prior to chemo and 5 tablets (20mg ) 2 days after chemo. (Patient not taking: Reported on 03/28/2017) 20 tablet 3   No current facility-administered medications for this visit.    Facility-Administered Medications Ordered in Other Visits  Medication Dose Route Frequency Provider Last Rate Last Dose  . heparin lock  flush 100 unit/mL  500 Units Intravenous Once Earlie Server, MD      . sodium chloride flush (NS) 0.9 % injection 10 mL  10 mL Intravenous PRN Earlie Server, MD   10 mL at 03/28/17 5427    PHYSICAL EXAMINATION:  ECOG PERFORMANCE STATUS: 1 - Symptomatic but completely ambulatory   Vitals:   03/28/17 0842  BP: 114/77  Pulse: 71  Resp: 16  Temp: 98 F (36.7 C)    Filed Weights   03/28/17 0842  Weight: 115 lb (52.2 kg)     Physical Exam  Constitutional: She is oriented to person, place, and time and well-developed, well-nourished, and in no distress. No distress.  HENT:  Head: Normocephalic and atraumatic.  Eyes: EOM are normal. Pupils are equal, round, and reactive to light. No scleral icterus.  Neck: Normal range of motion. Neck supple. No JVD present.  Cardiovascular:  No murmur heard. Pulmonary/Chest: Effort normal and breath sounds normal. No respiratory distress.  Abdominal: Soft. Bowel sounds are normal. She exhibits no distension.  Soreness around the incision, still has staples. No erythema or discharge.  Musculoskeletal: Normal range of motion. She exhibits no edema.  Lymphadenopathy:  She has no cervical adenopathy.  Neurological: She is alert and oriented to person, place, and time.  Skin: Skin is warm and dry.  Psychiatric: Affect normal.       SKIN: LABORATORY DATA: I have personally reviewed the data as listed: CBC    Component Value Date/Time   WBC 5.5 03/28/2017 0827   RBC 2.62 (L) 03/28/2017 0827   HGB 8.5 (L) 03/28/2017 0827   HCT 25.0 (L) 03/28/2017 0827   PLT 317 03/28/2017 0827   MCV 95.6 03/28/2017 0827   MCH 32.3 03/28/2017 0827   MCHC 33.8 03/28/2017 0827   RDW 15.7 (H) 03/28/2017 0827   LYMPHSABS 1.1 03/28/2017 0827   MONOABS 0.3 03/28/2017 0827   EOSABS 0.5 03/28/2017 0827   BASOSABS 0.0 03/28/2017 0827   CMP Latest Ref Rng & Units 03/15/2017 02/28/2017 02/05/2017  Glucose 65 - 99 mg/dL 138(H) 99 134(H)  BUN 6 - 20 mg/dL 7 12 16    Creatinine 0.44 - 1.00 mg/dL 0.47 0.59 0.53  Sodium 135 - 145 mmol/L 128(L) 132(L) 132(L)  Potassium 3.5 - 5.1 mmol/L 3.6 3.9 3.8  Chloride 101 - 111 mmol/L 98(L) 98(L) 101  CO2 22 - 32 mmol/L 21(L) 24 22  Calcium 8.9 - 10.3 mg/dL 8.9 10.4(H) 9.9  Total Protein 6.5 - 8.1 g/dL - 7.6 7.1  Total Bilirubin 0.3 - 1.2 mg/dL - 0.7 0.5  Alkaline Phos 38 - 126 U/L - 74 65  AST 15 - 41 U/L - 20 20  ALT 14 - 54 U/L - 11(L) 12(L)    pathology 11/16/2016 Surgical Pathology  CASE: [id number]  PATIENT: Yashira Nephew  Surgical Pathology Report   SPECIMEN SUBMITTED:  A. Retroperitoneal adenopathy, left  DIAGNOSIS:  A. LYMPH NODE, LEFT RETROPERITONEAL; CT-GUIDED CORE BIOPSY:  - METASTATIC HIGH-GRADE SEROUS CARCINOMA.   Comment:   RADIOGRAPHIC STUDIES: I have personally reviewed the radiological images as listed and agree with the findings in the report CT chest abdomen pelvis with contrast 11/13/2016 IMPRESSION: 1. Large heterogeneous central pelvic mass measures up to 12.6 cm. The lesion generates substantial mass-effect on the uterus, bladder, and pelvic sidewall anatomy. The mass appears to invade the mid sigmoid colon. Etiology of the central pelvic mass is not definitive by CT, but ovarian primary is suspected. The lesion becomes indistinguishable from the posterior uterus on some images and uterine origin is possible, but the uterus does not appear to be the epicenter of the mass and appears to be more displaced by it. MRI of the pelvis without and with contrast may prove helpful to better delineate the relationship of the uterus to the central pelvic mass although it may not be able to definitively localize the origin. 2. Bulky retroperitoneal and left pelvic sidewall lymphadenopathy. The retroperitoneal lymphadenopathy generates substantial mass-effect on the IVC and left renal vein. The external iliac veins along each pelvic sidewall are markedly attenuated by the  mass/lymphadenopathy. Patency of these vessels cannot be definitely confirmed on this exam.  Pathology 03/14/2017   DIAGNOSIS:  A. OMENTUM; OMENTECTOMY:  - NO TUMOR SEEN.  - ONE NEGATIVE LYMPH NODE (0/1).   B. RIGHT FALLOPIAN TUBE AND OVARY; SALPINGO-OOPHORECTOMY:  - SMALL FOCI OF HIGH GRADE SEROUS CARCINOMA INVOLVING THE OVARY.  - MARKED THERAPY RELATED CHANGE.  - NO TUMOR SEEN IN THE FALLOPIAN TUBE.   C. UTERUS, CERVIX, LEFT FALLOPIAN TUBE AND OVARY; HYSTERECTOMY AND LEFT  SALPINGO-OOPHORECTOMY:  - NABOTHIAN CYSTS.  - CYSTIC ATROPHY OF THE ENDOMETRIUM.  - SEROSAL ADHESIONS.  -  UNREMARKABLE FALLOPIAN TUBE.  - OVARY SHOWING TREATMENT RELATED CHANGE.   D. 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    ASSESSMENT/PLAN Cancer Staging Malignant neoplasm of ovary [facility]) Staging form: Ovary, Fallopian Tube, and Primary Peritoneal Carcinoma, AJCC 8th Edition - Clinical stage from 11/22/2016: Stage IIIC (cT3c, cN1b, cM0) - Signed by Earlie Server, MD on 11/22/2016  1. High grade ovarian cancer ([place])   2. Encounter for antineoplastic chemotherapy   3. Anemia, unspecified type    # OK to proceed to cycle 5 carbo and taxol.  Plan day 3 Neulasta. # Chemotherapy related skin toxicity, likely from Taxol. Continue Dexamethason 20mg  for two days after chemotherapy to prevent skin toxicities. # Take Percocet as needed for localized pain. # Will order anemia workup. Follow-up on 3 weeks for next course of chemotherapy. Patient's wound was examined by [facility], he is ok with starting chemotherapy today.   All questions were answered. The patient knows to call the clinic with any problems, questions or concerns.  Earlie Server, MD  03/28/2017 8:41 AM

## 2017-03-28 NOTE — Progress Notes (Signed)
Patient here for follow up with labs and treatment today. She states that she is feeling well, except for some soreness in her abdomen. Her husband states that she has done very well since her surgery.

## 2017-03-29 ENCOUNTER — Ambulatory Visit (INDEPENDENT_AMBULATORY_CARE_PROVIDER_SITE_OTHER): Payer: Medicare Other | Admitting: Obstetrics and Gynecology

## 2017-03-29 ENCOUNTER — Encounter: Payer: Self-pay | Admitting: Obstetrics and Gynecology

## 2017-03-29 VITALS — BP 110/68 | HR 90 | Ht 68.0 in | Wt 115.0 lb

## 2017-03-29 DIAGNOSIS — Z4889 Encounter for other specified surgical aftercare: Secondary | ICD-10-CM

## 2017-03-29 LAB — IRON AND TIBC
Iron: 33 ug/dL (ref 28–170)
SATURATION RATIOS: 12 % (ref 10.4–31.8)
TIBC: 287 ug/dL (ref 250–450)
UIBC: 254 ug/dL

## 2017-03-29 LAB — FOLATE: Folate: 29 ng/mL (ref >5.9)

## 2017-03-29 LAB — FERRITIN: Ferritin: 171 ng/mL (ref 11–307)

## 2017-03-29 LAB — CA 125: Cancer Antigen (CA) 125: 31.9 U/mL (ref 0.0–38.1)

## 2017-03-29 NOTE — Progress Notes (Signed)
      Postoperative Follow-up Patient presents post op from TAH, BSO, omentectomy, and para-aortic lymph node disection 2weeks ago for debuling ovarian cancer following neoadjuvant chemotherapy..  Subjective: Patient reports marked improvement in her preop symptoms. Eating a regular diet without difficulty. The patient is not having any pain.  Activity: normal activities of daily living.  Objective: Vitals:   03/29/17 1057  BP: 110/68  Pulse: 90   Gen: NAD HEENT: normocephalic, anicteric Pulmonary: CTAB Abdomen: soft, non-tender, non-distended, incision D/C/I staples removed steri strips applied Ext: no edema  Assessment: 66 y.o. s/p TAH, BSO, omentectomy, and para-aortic lymph node disection stable  Plan: Patient has done well after surgery with no apparent complications.  I have discussed the post-operative course to date, and the expected progress moving forward.  The patient understands what complications to be concerned about.  I will see the patient in routine follow up, or sooner if needed.    Activity plan: No heavy lifting.  Malachy Mood 03/29/2017, 10:13 PMStaples removed

## 2017-04-02 ENCOUNTER — Inpatient Hospital Stay: Payer: Medicare Other

## 2017-04-02 NOTE — Progress Notes (Signed)
Patient scheduled for Neulasta injection today with her last chemotherapy treatment on 03/18/17.  Discussed with pharmacy about today being the 5th day after treatment and should she receive the Neulasta injection.  The package insert for pegfilgrastim notes that it should not be administered between 14 days before and 24 hours after administration of cytotoxic chemotherapy.  Optimal timing is considered to be within 24 to 72 hours after chemotherapy administration.  Pharmacy also checked with our insurance department and they states that it shouldn't be a problem.  I discussed with patient and her husband about the optimal time frame of neulasta injection along with possibility of insurance issues.  Patient would rather not receive the injection today.  Advised her to follow neutropenic precautions and she understands (this is her 4th treatment).

## 2017-04-06 ENCOUNTER — Encounter: Payer: Self-pay | Admitting: Genetic Counselor

## 2017-04-06 ENCOUNTER — Telehealth: Payer: Self-pay | Admitting: Genetic Counselor

## 2017-04-06 NOTE — Telephone Encounter (Signed)
Cancer Genetics             Telegenetics Results Disclosure   Patient Name: Katie Hill Patient DOB: 1950-06-09 Patient Age: 66 y.o. Phone Call Date: 04/06/2017  Referring Provider: Earlie Server, MD    Katie Hill was called today to discuss genetic test results. Please see the Genetics telephone note from 02/20/2017 for a detailed discussion of her personal and family histories and the recommendations provided.  Genetic Testing: At the time of Katie Hill's telegenetics visit, she decided to pursue genetic testing of multiple genes associated with hereditary susceptibility to cancer. Testing included sequencing and deletion/duplication analysis. Testing did not reveal any pathogenic mutation in any of these genes.  A copy of the genetic test report will be scanned into Epic under the media tab.  The genes analyzed were the 83 genes on [facility]'s Multi-Cancer panel (ALK, APC, ATM, AXIN2, BAP1, BARD1, BLM, BMPR1A, BRCA1, BRCA2, BRIP1, CASR, CDC73, CDH1, CDK4, CDKN1B, CDKN1C, CDKN2A, CEBPA, CHEK2, CTNNA1, DICER1, DIS3L2, EGFR, EPCAM, FH, FLCN, GATA2, GPC3, GREM1, HOXB13, HRAS, KIT, MAX, MEN1, MET, MITF, MLH1, MSH2, MSH3, MSH6, MUTYH, NBN, NF1, NF2, NTHL1, PALB2, PDGFRA, PHOX2B, PMS2, POLD1, POLE, POT1, PRKAR1A, PTCH1, PTEN, RAD50, RAD51C, RAD51D, RB1, RECQL4, RET, RUNX1, SDHA, SDHAF2, SDHB, SDHC, SDHD, SMAD4, SMARCA4, SMARCB1, SMARCE1, STK11, SUFU, TERC, TERT, TMEM127, TP53, TSC1, TSC2, VHL, WRN, WT1).  Since the current test is not perfect, it is possible that there may be a gene mutation that current testing cannot detect, but that chance is small. It is possible that a different genetic factor, which has not yet been discovered or is not on this panel, is responsible for the cancer diagnoses in the family. Again, the likelihood of this is low. No additional testing is recommended at this time for Katie Hill.  A Variant of Uncertain Significance was detected: CASR c.106G>A  (p.Gly36Arg). This is still considered a normal result. While at this time, it is unknown if this finding is associated with increased cancer risk, the majority of these variants get reclassified to be inconsequential. We emphasized that medical management should not be based on this finding. With time, we suspect the lab will determine the significance, if any. If we do learn more about it, we will try to contact Katie Hill to discuss it further. It is important to stay in touch with [place] periodically and keep the address and phone number up to date.  Cancer Screening: These results suggest that Katie Hill's cancer was most likely not due to an inherited predisposition. Most cancers happen by chance and this test, along with details of her family history, suggests that her cancer falls into this category. We discussed continuing to follow the cancer screening guidelines provided by her physician.   Family Members: Family members are recommended to speak with their own providers about appropriate cancer screenings.  Any relative who had cancer at a young age or had a particularly rare cancer may also wish to pursue genetic testing. Genetic counselors can be located in other cities, by visiting the website of the [employer] ([employer]) and Field seismologist for a Dietitian by zip code.   Family members are not recommended to get tested for the above VUS outside of a research protocol as this finding has no implications for their medical management.    Lastly, cancer genetics is a rapidly advancing field  and it is possible that new genetic tests will be appropriate for Katie Hill in the future. We encourage Katie Hill to remain in contact with Genetics on an annual basis so her personal and family histories can be updated.    Steele Berg, MS, [place] Certified Genetic Counselor phone: [phone]

## 2017-04-06 NOTE — Progress Notes (Signed)
Genetic testing results mailed to home address. Oncology Nurse Navigator Documentation  Navigator Location: [facility] (04/06/17 1200)   )Navigator Encounter Type: Letter/Fax/Email;Genetics (04/06/17 1200)                                                    Time Spent with Patient: 15 (04/06/17 1200)

## 2017-04-10 NOTE — Progress Notes (Signed)
Gynecologic Oncology Visit   Referring Provider: Dr. 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  Subjective:  Katie Hill is a 67 y.o. G0P0 female with advanced ovarian cancer s/p 4 cycles of neoadjuvant chemotherapy who presents for her postoperative visit.   On 03/14/2017 she underwent L/S converted to XL, TAH, BSO, right PA node resection, omentectomy, and LOA to no residual disease.   Postoperatively she has done well.   Pathology 03/14/2017   DIAGNOSIS:  A. OMENTUM; OMENTECTOMY:  - NO TUMOR SEEN.  - ONE NEGATIVE LYMPH NODE (0/1).   B. RIGHT FALLOPIAN TUBE AND OVARY; SALPINGO-OOPHORECTOMY:  - SMALL FOCI OF HIGH GRADE SEROUS CARCINOMA INVOLVING THE OVARY.  - MARKED THERAPY RELATED CHANGE.  - NO TUMOR SEEN IN THE FALLOPIAN TUBE.   C. UTERUS, CERVIX, LEFT FALLOPIAN TUBE AND OVARY; HYSTERECTOMY AND LEFT  SALPINGO-OOPHORECTOMY:  - NABOTHIAN CYSTS.  - CYSTIC ATROPHY OF THE ENDOMETRIUM.  - SEROSAL ADHESIONS.  - UNREMARKABLE FALLOPIAN TUBE.  - OVARY SHOWING TREATMENT RELATED CHANGE.   D. 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    Cycle of chemotherapy given on 03/28/2017. CA125 31.9 .   Oncology history Katie Hill is a pleasant. G0P0 female with advanced ovarian cancer. See prior notes for complete details. She presented to the ER 11/13/2016 with symptoms of right lower quadrant pain for 3-4 weeks, associated with nausea, bloating, no change in weight, urinary frequency, and change in bowel habits with more diffucult and stringy BM's.  CT CHEST, ABDOMEN, AND PELVIS WITH CONTRAST IMPRESSION: 1. Large heterogeneous central pelvic mass measures up to 12.6 cm. The lesion generates substantial mass-effect on the uterus, bladder, and pelvic sidewall anatomy. The mass appears to invade the mid sigmoid colon. Etiology of the central  pelvic mass is not definitive by CT, but ovarian primary is suspected. The lesion becomes indistinguishable from the posterior uterus on some images and uterine origin is possible, but the uterus does not appear to be the epicenter of the mass and appears to be more displaced by it. MRI of the pelvis without and with contrast may prove helpful to better delineate the relationship of the uterus to the central pelvic mass although it may not be able to definitively localize the origin. 2. Bulky retroperitoneal and left pelvic sidewall lymphadenopathy. The retroperitoneal lymphadenopathy generates substantial mass-effect on the IVC and left renal vein. The external iliac veins along each pelvic sidewall are markedly attenuated by the mass/lymphadenopathy. Patency of these vessels cannot be definitely confirmed on this exam.  11/16/16 LYMPH NODE, LEFT RETROPERITONEAL; CT-GUIDED CORE BIOPSY:  - METASTATIC HIGH-GRADE SEROUS CARCINOMA.   Decision was made to do neoadjuvant carbo/taxol chemotherapy.  CA125 has fallen from 4,382 to 64.6 (10/29).  CT scan shows dramatic response.  CT scan 10/14 Vascular/Lymphatic: Normal appearance of the abdominal aorta. Interval decrease in size of previously identified bulky retroperitoneal adenopathy. Index aortocaval node measures 1.6 x 2.6 cm, image 32 of series 2. Previously 4.4 x 5.3 cm. Index left periaortic nodal mass measures 2.2 x 1.3 cm, image 31 of series 2. Previously 3.7 x 4.7 cm. At the level of the bifurcation there is a low-attenuation nodal mass which measure 3.1 x 2.6 cm, image 44 of series 2. Previously 4.3 x 4.8 cm. Left common iliac node measures 1.2 cm, image 53 of series 2. Previously 2.2 cm. Left external iliac node measures 1.6 x 1.1 cm,  image 67 of series 2. Previously 4.8 x 2.6 cm. Reproductive: Large mass centered around the uterus measures 9.4 x 5.5 cm, image 67 of series 2. Previously this measured 12.6 x 9.2 Cm  On  03/14/2017 she underwent L/S converted to XL, TAH, BSO, right PA node resection, omentectomy, and LOA to no residual disease.   Genetic testing negative for 83 genes on [facility]'s Multi-Cancer panel (ALK, APC, ATM, AXIN2, BAP1, BARD1, BLM, BMPR1A, BRCA1, BRCA2, BRIP1, CASR, CDC73, CDH1, CDK4, CDKN1B, CDKN1C, CDKN2A, CEBPA, CHEK2, CTNNA1, DICER1, DIS3L2, EGFR, EPCAM, FH, FLCN, GATA2, GPC3, GREM1, HOXB13, HRAS, KIT, MAX, MEN1, MET, MITF, MLH1, MSH2, MSH3, MSH6, MUTYH, NBN, NF1, NF2, NTHL1, PALB2, PDGFRA, PHOX2B, PMS2, POLD1, POLE, POT1, PRKAR1A, PTCH1, PTEN, RAD50, RAD51C, RAD51D, RB1, RECQL4, RET, RUNX1, SDHA, SDHAF2, SDHB, SDHC, SDHD, SMAD4, SMARCA4, SMARCB1, SMARCE1, STK11, SUFU, TERC, TERT, TMEM127, TP53, TSC1, TSC2, VHL, WRN, WT1).  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 This is still considered a normal result.   Problem List: Patient Active Problem List   Diagnosis Date Noted  . Hyponatremia 12/12/2016  . Dehydration 12/08/2016  . Malignant neoplasm of ovary ([place]) 11/20/2016  . Pelvic mass 11/15/2016    Past Medical History: Past Medical History:  Diagnosis Date  . High grade ovarian cancer ([place]) 11/20/2016  . Pelvic mass in female     Past Surgical History: Past Surgical History:  Procedure Laterality Date  . APPENDECTOMY    . PORTA CATH INSERTION N/A 11/27/2016   Procedure: Glori Luis Cath Insertion;  Surgeon: Algernon Huxley, MD;  Location: [facility];  Service: Cardiovascular;  Laterality: N/A;    Past Gynecologic History:  Menarche: 17 Last Menstrual Period: 24 years ago History of Abnormal pap: no Last pap: years ago She does not have regular medical cancer and has not has screening mammogram, colonoscopy, or Pap smears. She has not had an abnormal Pap.   OB History:  OB History  Gravida Para Term Preterm AB Living  0 0 0 0 0 0  SAB TAB Ectopic Multiple Live Births  0 0 0 0 0        Family History: Family History   Problem Relation Age of Onset  . Throat cancer Cousin   . Throat cancer Cousin     Social History: Social History   Socioeconomic History  . Marital status: Married    Spouse name: Not on file  . Number of children: Not on file  . Years of education: Not on file  . Highest education level: Not on file  Social Needs  . Financial resource strain: Not on file  . Food insecurity - worry: Not on file  . Food insecurity - inability: Not on file  . Transportation needs - medical: Not on file  . Transportation needs - non-medical: Not on file  Occupational History  . Not on file  Tobacco Use  . Smoking status: Never Smoker  . Smokeless tobacco: Never Used  Substance and Sexual Activity  . Alcohol use: Yes    Comment: occasional   . Drug use: No  . Sexual activity: Yes    Birth control/protection: Post-menopausal  Other Topics Concern  . Not on file  Social History Narrative  . Not on file    Allergies: Allergies  Allergen Reactions  . Omeprazole Rash    Current Medications: Current Outpatient Medications  Medication Sig Dispense Refill  . Calcium Carbonate Antacid (TUMS E-X PO) Take by mouth.    [name]  dexamethasone (DECADRON) 4 MG tablet Take 5 tablets (20 mg total) by mouth daily. Take 20 mg daily for 2 days prior to your chemotherapy and take 43m daily for 2 days after chemotherapy. 20 tablet 2  . hydrocortisone 2.5 % cream Apply topically 2 (two) times daily. 30 g 0  . lidocaine-prilocaine (EMLA) cream Apply 1 application topically as needed. Apply small amount to port site at least 1 hour prior to it being accessed, cover with plastic wrap 30 g 1  . ondansetron (ZOFRAN) 4 MG tablet Take 1 tablet (4 mg total) by mouth daily as needed for nausea or vomiting. 30 tablet 0  . oxyCODONE (OXY IR/ROXICODONE) 5 MG immediate release tablet Take 1 tablet (5 mg total) by mouth every 4 (four) hours as needed for severe pain. 30 tablet 0  . senna (SENOKOT) 8.6 MG TABS tablet Take 2  tablets (17.2 mg total) by mouth daily. Hold if loose stool. 120 each 0  . Tetrahydrozoline HCl (REDNESS RELIEVER EYE DROPS OP) Apply 1 drop to eye daily as needed (red eye).     Current Facility-Administered Medications  Medication Dose Route Frequency Provider Last Rate Last Dose  . ondansetron (ZOFRAN) injection 4 mg  4 mg Intravenous Once DAlgernon Huxley MD        Review of Systems General: no complaints  HEENT: no complaints  Lungs: no complaints  Cardiac: no complaints  GI: incisional pain, constipation managed with stool softeners  GU: vaginal spotting  Musculoskeletal: no complaints  Extremities: no complaints  Skin: no complaints  Neuro: no complaints  Endocrine: no complaints  Psych: no complaints     Objective:  Physical Examination:  BP 104/67   Pulse 76   Temp (!) 97.3 F (36.3 C) (Oral)   Resp 18   Ht '5\' 8"'  (1.727 m)   Wt 114 lb 6.4 oz (51.9 kg)   BMI 17.39 kg/m     ECOG Performance Status: 1 - Symptomatic but completely ambulatory  General appearance: alert, cooperative and appears stated age HEENT:PERRLA, extra ocular movement intact and sclera clear, anicteric Lymph node survey: non-palpable, axillary, and supraclavicular, bilateral palpable inguinal nodes but not enlarged. Cardiovascular: regular rate and rhythm Respiratory: normal air entry, lungs clear to auscultation Abdomen: soft, NTND, no masses or ascites; incision healing well and no hernias Back: inspection of back is normal Extremities: extremities normal, atraumatic, no cyanosis or edema Skin exam - normal coloration and turgor, no rashes, no suspicious skin lesions noted. Neurological exam reveals alert, oriented, normal speech, no focal findings or movement disorder noted.  Pelvic: exam chaperoned by nurse;  Vulva: normal appearing vulva with no masses, tenderness or lesions; Vagina: normal vagina, cuff healing small area of redness on right apex; Adnexa/Uterus/Cervix: surgically absent;  BME negative for masses or nodularity. Rectal: deferred  LABS: Lab Results  Component Value Date   WBC 12.2 (H) 02/05/2017   HGB 10.1 (L) 02/05/2017   HCT 28.9 (L) 02/05/2017   MCV 86.9 02/05/2017   PLT 189 02/05/2017     Chemistry      Component Value Date/Time   NA 132 (L) 02/05/2017 0840   K 3.8 02/05/2017 0840   CL 101 02/05/2017 0840   CO2 22 02/05/2017 0840   BUN 16 02/05/2017 0840   CREATININE 0.53 02/05/2017 0840      Component Value Date/Time   CALCIUM 9.9 02/05/2017 0840   ALKPHOS 65 02/05/2017 0840   AST 20 02/05/2017 0840   ALT 12 (L) 02/05/2017  0840   BILITOT 0.5 02/05/2017 0840     Albumin 4.0    Assessment:  Kenlyn Lose is a 67 y.o. female diagnosed with high grade serous ovarian cancer on CT directed node biopsy with partial bowel obstruction and extensive bulky adenopathy 8/18. s/p 4 cycles of carbo/taxol chemotherapy with dramatic decline in CA125 and improvement on CT scan. Interval debulking surgery to no gross residual on 03/14/2017. Reinitiation of chemotherapy on 03/28/2017.   Medical co-morbidities complicating care: prior abdominal surgery.  Plan:   Problem List Items Addressed This Visit      Genitourinary   Malignant neoplasm of ovary ([place]) - Primary   Relevant Orders   CBC with Differential   Comprehensive metabolic panel   Magnesium   CA 125     Continued follow up with Dr. Tasia Catchings. RTC to Gynecologic Oncology clinic in 2 months. I recommended 2 more cycles of chemotherapy.  Genetic testing negative other than VUS.  I previously discussed the [place] with her and if she is either a BRCA1/2 carrier or has a somatic BRCA1/2 mutation I would recommend olaparib maintenance once she completes her therapy. Recommend BRCA1/2 tumor testing.  Gillis Ends, MD  CC:  Gae Dry,  [address], [place] [address]

## 2017-04-11 ENCOUNTER — Encounter: Payer: Self-pay | Admitting: Obstetrics and Gynecology

## 2017-04-11 ENCOUNTER — Inpatient Hospital Stay: Payer: Medicare Other | Attending: Oncology | Admitting: Obstetrics and Gynecology

## 2017-04-11 ENCOUNTER — Encounter: Payer: Self-pay | Admitting: Oncology

## 2017-04-11 VITALS — BP 104/67 | HR 76 | Temp 97.3°F | Resp 18 | Ht 68.0 in | Wt 114.4 lb

## 2017-04-11 DIAGNOSIS — C569 Malignant neoplasm of unspecified ovary: Secondary | ICD-10-CM

## 2017-04-11 DIAGNOSIS — C786 Secondary malignant neoplasm of retroperitoneum and peritoneum: Secondary | ICD-10-CM | POA: Insufficient documentation

## 2017-04-11 DIAGNOSIS — L271 Localized skin eruption due to drugs and medicaments taken internally: Secondary | ICD-10-CM | POA: Insufficient documentation

## 2017-04-11 DIAGNOSIS — C561 Malignant neoplasm of right ovary: Secondary | ICD-10-CM | POA: Insufficient documentation

## 2017-04-11 DIAGNOSIS — D6481 Anemia due to antineoplastic chemotherapy: Secondary | ICD-10-CM | POA: Insufficient documentation

## 2017-04-11 DIAGNOSIS — Z5111 Encounter for antineoplastic chemotherapy: Secondary | ICD-10-CM | POA: Insufficient documentation

## 2017-04-11 DIAGNOSIS — E876 Hypokalemia: Secondary | ICD-10-CM | POA: Insufficient documentation

## 2017-04-11 NOTE — Progress Notes (Signed)
Pt has occ. Sharp pain in abdomen at times. Not any today, pt has some spotting after surgery and had it again a couple days ago and it was small amount

## 2017-04-17 ENCOUNTER — Other Ambulatory Visit: Payer: Self-pay | Admitting: *Deleted

## 2017-04-17 DIAGNOSIS — D649 Anemia, unspecified: Secondary | ICD-10-CM

## 2017-04-17 NOTE — Progress Notes (Addendum)
[place] [facility] Follow up visit  Patient Care Team: Patient, [name], RN as Registered Nurse Gae Dry, MD as Referring Physician (Obstetrics and Gynecology)  CHIEF COMPLAINTS/PURPOSE OF Visit Follow up for Treatment of  ovarian cancer.  HISTORY OF PRESENTING ILLNESS: Katie Hill 67 y.o. female presents for follow up of management of ovarian cancer. She is on Neoadjuvant chemotherapy.   On presentation, CT imaging revealed a large pelvic mass and extensive adenopathy. 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 Patient reports healthy at baseline and has not follow-up with any doctor for her life. She is married, G0P0 She denies any fatigue, weight loss, chest pain, cough, shortness of breath, and lower extremity swelling. She noted some urinary symptoms with frequency of urination. She also mentioned that she intermittently pass some mucus in her stool.also some back pain which is new.  Treatment:  #Carboplatin and taxol x 4 # Patient had debulking surgery on 03/14/2017. She had a laparoscopy with conversion to laparotomy, total hysterectomy, with bilateral salpingo oophorectomy, right aortic lymph node dissection, omentectomy. Pathology showed small foci of residual disease in ovary.   INTERVAL HISTORY Patient presents for evaluation prior to post surgery and adjuvant chemotherapy,   Today she reports feeling well. She has taken her steroid premeds. Denies any rash, chest pain, SOB, abdominal pain.  She has a good appetite. Fatigue is better   Review of Systems  Constitutional: Negative for appetite change, chills and fatigue.  HENT:   Negative for hearing loss and mouth sores.   Eyes: Negative for eye problems.  Respiratory: Negative for chest tightness and cough.   Cardiovascular: Negative for chest pain and leg swelling.  Gastrointestinal:  Negative for abdominal distention and abdominal pain.  Endocrine: Negative for hot flashes.  Genitourinary: Negative for difficulty urinating and dysuria.   Musculoskeletal: Negative for arthralgias, back pain and gait problem.  Skin: Negative for itching.  Neurological: Negative for dizziness, gait problem and speech difficulty.  Hematological: Negative for adenopathy.  Psychiatric/Behavioral: The patient is not nervous/anxious.     MEDICAL HISTORY: Past Medical History:  Diagnosis Date  . Dysrhythmia   . Genetic testing 03/28/2017   Multi-Cancer panel (83 genes) @ [facility] - No pathogenic mutations detected  . High grade ovarian cancer ([place]) 11/20/2016  . Pelvic mass in female     SURGICAL HISTORY: Past Surgical History:  Procedure Laterality Date  . APPENDECTOMY    . LAPAROSCOPY N/A 03/14/2017   Procedure: LAPAROSCOPY OPERATIVE;  Surgeon: Mellody Drown, MD;  Location: [facility];  Service: Gynecology;  Laterality: N/A;  . LAPAROTOMY N/A 03/14/2017   Procedure: LAPAROTOMY;  Surgeon: Mellody Drown, MD;  Location: [facility];  Service: Gynecology;  Laterality: N/A;  . LYMPH NODE DISSECTION N/A 03/14/2017   Procedure: LYMPH NODE DISSECTION;  Surgeon: Mellody Drown, MD;  Location: [facility];  Service: Gynecology;  Laterality: N/A;  . OMENTECTOMY N/A 03/14/2017   Procedure: OMENTECTOMY;  Surgeon: Mellody Drown, MD;  Location: [facility];  Service: Gynecology;  Laterality: N/A;  . PORTA CATH INSERTION N/A 11/27/2016   Procedure: Glori Luis Cath Insertion;  Surgeon: Algernon Huxley, MD;  Location: [facility];  Service: Cardiovascular;  Laterality: N/A;    SOCIAL HISTORY: Social History   Socioeconomic History  . Marital status: Married    Spouse name: Not on file  . Number of children: Not on file  . Years  of education: Not on file  . Highest education level: Not on file  Social Needs  . Financial resource strain: Not on file  . Food insecurity - worry: Not on file  . Food  insecurity - inability: Not on file  . Transportation needs - medical: Not on file  . Transportation needs - non-medical: Not on file  Occupational History  . Not on file  Tobacco Use  . Smoking status: Never Smoker  . Smokeless tobacco: Never Used  Substance and Sexual Activity  . Alcohol use: No    Frequency: Never    Comment: use to be occ. but not any since 5 months  . Drug use: No  . Sexual activity: Yes    Birth control/protection: Post-menopausal  Other Topics Concern  . Not on file  Social History Narrative  . Not on file    FAMILY HISTORY Family History  Problem Relation Age of Onset  . Throat cancer Cousin   . Throat cancer Cousin     ALLERGIES:  is allergic to omeprazole.  MEDICATIONS:  Current Outpatient Medications  Medication Sig Dispense Refill  . calcium carbonate (TUMS - DOSED IN MG ELEMENTAL CALCIUM) 500 MG chewable tablet Chew 1 tablet by mouth as needed for indigestion or heartburn.    . dexamethasone (DECADRON) 4 MG tablet Take 5 tablets (20 mg) 2 days prior to chemo and 5 tablets (22m) 2 days after chemo. 20 tablet 3  . enoxaparin (LOVENOX) 40 MG/0.4ML injection Inject 0.4 mLs (40 mg total) into the skin daily. 28 Syringe 0  . lidocaine-prilocaine (EMLA) cream Apply 1 application topically as needed. Apply small amount to port site at least 1 hour prior to it being accessed, cover with plastic wrap 30 g 1  . ondansetron (ZOFRAN) 4 MG tablet Take 1 tablet (4 mg total) by mouth daily as needed for nausea or vomiting. 30 tablet 0  . oxyCODONE-acetaminophen (PERCOCET/ROXICET) 5-325 MG tablet Take 1-2 tablets by mouth every 4 (four) hours as needed (moderate to severe pain (when tolerating fluids)). 30 tablet 0  . senna (SENOKOT) 8.6 MG TABS tablet Take 2 tablets (17.2 mg total) by mouth daily. Hold if loose stool. (Patient taking differently: Take 1 tablet by mouth at bedtime. Hold if loose stool.) 120 each 0  . Tetrahydrozoline HCl (REDNESS RELIEVER EYE  DROPS OP) Place 1 drop into both eyes as needed (for red eyes).      No current facility-administered medications for this visit.     PHYSICAL EXAMINATION:  ECOG PERFORMANCE STATUS: 1 - Symptomatic but completely ambulatory   Vitals:   04/18/17 0905  BP: 113/69  Pulse: 84  Resp: 18  Temp: (!) 97 F (36.1 C)    Filed Weights   04/18/17 0905  Weight: 114 lb (51.7 kg)     Physical Exam  Constitutional: She is oriented to person, place, and time and well-developed, well-nourished, and in no distress. No distress.  HENT:  Head: Normocephalic and atraumatic.  Eyes: EOM are normal. Pupils are equal, round, and reactive to light. No scleral icterus.  Neck: Normal range of motion. Neck supple. No JVD present.  Cardiovascular:  No murmur heard. Pulmonary/Chest: Effort normal and breath sounds normal. No respiratory distress.  Abdominal: Soft. Bowel sounds are normal. She exhibits no distension.  Incision is well healed.  Musculoskeletal: Normal range of motion. She exhibits no edema.  Lymphadenopathy:    She has no cervical adenopathy.  Neurological: She is alert and oriented to person, place,  and time.  Skin: Skin is warm and dry.  Psychiatric: Affect normal.       SKIN: LABORATORY DATA: I have personally reviewed the data as listed: CBC    Component Value Date/Time   WBC 5.5 03/28/2017 0827   RBC 2.62 (L) 03/28/2017 0827   HGB 8.5 (L) 03/28/2017 0827   HCT 25.0 (L) 03/28/2017 0827   PLT 317 03/28/2017 0827   MCV 95.6 03/28/2017 0827   MCH 32.3 03/28/2017 0827   MCHC 33.8 03/28/2017 0827   RDW 15.7 (H) 03/28/2017 0827   LYMPHSABS 1.1 03/28/2017 0827   MONOABS 0.3 03/28/2017 0827   EOSABS 0.5 03/28/2017 0827   BASOSABS 0.0 03/28/2017 0827   CMP Latest Ref Rng & Units 03/28/2017 03/15/2017 02/28/2017  Glucose 65 - 99 mg/dL 84 138(H) 99  BUN 6 - 20 mg/dL '7 7 12  ' Creatinine 0.44 - 1.00 mg/dL 0.57 0.47 0.59  Sodium 135 - 145 mmol/L 133(L) 128(L) 132(L)  Potassium  3.5 - 5.1 mmol/L 4.2 3.6 3.9  Chloride 101 - 111 mmol/L 102 98(L) 98(L)  CO2 22 - 32 mmol/L 24 21(L) 24  Calcium 8.9 - 10.3 mg/dL 9.5 8.9 10.4(H)  Total Protein 6.5 - 8.1 g/dL 6.6 - 7.6  Total Bilirubin 0.3 - 1.2 mg/dL 0.4 - 0.7  Alkaline Phos 38 - 126 U/L 62 - 74  AST 15 - 41 U/L 19 - 20  ALT 14 - 54 U/L 10(L) - 11(L)    pathology 11/16/2016 Surgical Pathology  CASE: [id number]  PATIENT: Katie Hill  Surgical Pathology Report   SPECIMEN SUBMITTED:  A. Retroperitoneal adenopathy, left  DIAGNOSIS:  A. LYMPH NODE, LEFT RETROPERITONEAL; CT-GUIDED CORE BIOPSY:  - METASTATIC HIGH-GRADE SEROUS CARCINOMA.   Comment:   RADIOGRAPHIC STUDIES: I have personally reviewed the radiological images as listed and agree with the findings in the report CT chest abdomen pelvis with contrast 11/13/2016 IMPRESSION: 1. Large heterogeneous central pelvic mass measures up to 12.6 cm. The lesion generates substantial mass-effect on the uterus, bladder, and pelvic sidewall anatomy. The mass appears to invade the mid sigmoid colon. Etiology of the central pelvic mass is not definitive by CT, but ovarian primary is suspected. The lesion becomes indistinguishable from the posterior uterus on some images and uterine origin is possible, but the uterus does not appear to be the epicenter of the mass and appears to be more displaced by it. MRI of the pelvis without and with contrast may prove helpful to better delineate the relationship of the uterus to the central pelvic mass although it may not be able to definitively localize the origin. 2. Bulky retroperitoneal and left pelvic sidewall lymphadenopathy. The retroperitoneal lymphadenopathy generates substantial mass-effect on the IVC and left renal vein. The external iliac veins along each pelvic sidewall are markedly attenuated by the mass/lymphadenopathy. Patency of these vessels cannot be definitely confirmed on this exam.  Pathology 03/14/2017    DIAGNOSIS:  A. OMENTUM; OMENTECTOMY:  - NO TUMOR SEEN.  - ONE NEGATIVE LYMPH NODE (0/1).   B. RIGHT FALLOPIAN TUBE AND OVARY; SALPINGO-OOPHORECTOMY:  - SMALL FOCI OF HIGH GRADE SEROUS CARCINOMA INVOLVING THE OVARY.  - MARKED THERAPY RELATED CHANGE.  - NO TUMOR SEEN IN THE FALLOPIAN TUBE.   C. UTERUS, CERVIX, LEFT FALLOPIAN TUBE AND OVARY; HYSTERECTOMY AND LEFT  SALPINGO-OOPHORECTOMY:  - NABOTHIAN CYSTS.  - CYSTIC ATROPHY OF THE ENDOMETRIUM.  - SEROSAL ADHESIONS.  - UNREMARKABLE FALLOPIAN TUBE.  - OVARY SHOWING TREATMENT RELATED CHANGE.   D. PARA-AORTIC LYMPH  NODE; DISSECTION:  - PREDOMINANTLY NECROTIC TUMOR (0/1) SHOWING NEARCOMPLETE TREATMENT  RESPONSE.    ASSESSMENT/PLAN Cancer Staging Malignant neoplasm of ovary [facility]) Staging form: Ovary, Fallopian Tube, and Primary Peritoneal Carcinoma, AJCC 8th Edition - Clinical stage from 11/22/2016: Stage IIIC (cT3c, cN1b, cM0) - Signed by Earlie Server, MD on 11/22/2016  1. High grade ovarian cancer ([place])   2. Encounter for antineoplastic chemotherapy   3. Anemia, unspecified type    # OK to proceed to cycle 6 carbo and taxol.   # She has had rash due to chemotherapy.  Continue Dexamethason 6m for two days after chemotherapy to prevent skin toxicities. # Take Percocet as needed for localized pain. # Anemia is improving. Iron panel reviewd.  Follow-up on 3 weeks for final course of chemotherapy.repeat CA125. # Hypokalemia: give 254m KCL with chemotherapy today.  # Awaiting somatic mutation test for BRCA1/2 to see if eligible for Olarparib maintenance( SOLO1 data).   All questions were answered. The patient knows to call the clinic with any problems, questions or concerns. Follow up in 3 weeks. Lab, MD and Carbo Taxol.   ZhEarlie ServerMD, PhD Hematology Oncology [facility]- [phone]/12/2017

## 2017-04-17 NOTE — Progress Notes (Signed)
Per request of Dr. Theora Gianotti, somatic Tumor [facility] cdx test request form sent to [facility] with confirmation of receipt. Oncology Nurse Navigator Documentation  Navigator Location: [facility] (04/17/17 1400)   )Navigator Encounter Type: Letter/Fax/Email (04/17/17 1400)                                                    Time Spent with Patient: 15 (04/17/17 1400)

## 2017-04-18 ENCOUNTER — Inpatient Hospital Stay: Payer: Medicare Other

## 2017-04-18 ENCOUNTER — Inpatient Hospital Stay (HOSPITAL_BASED_OUTPATIENT_CLINIC_OR_DEPARTMENT_OTHER): Payer: Medicare Other | Admitting: Oncology

## 2017-04-18 ENCOUNTER — Encounter: Payer: Self-pay | Admitting: Oncology

## 2017-04-18 VITALS — BP 101/61 | HR 66 | Resp 18

## 2017-04-18 VITALS — BP 113/69 | HR 84 | Temp 97.0°F | Resp 18 | Wt 114.0 lb

## 2017-04-18 DIAGNOSIS — C569 Malignant neoplasm of unspecified ovary: Secondary | ICD-10-CM

## 2017-04-18 DIAGNOSIS — C561 Malignant neoplasm of right ovary: Secondary | ICD-10-CM | POA: Diagnosis not present

## 2017-04-18 DIAGNOSIS — Z5111 Encounter for antineoplastic chemotherapy: Secondary | ICD-10-CM

## 2017-04-18 DIAGNOSIS — E876 Hypokalemia: Secondary | ICD-10-CM | POA: Diagnosis not present

## 2017-04-18 DIAGNOSIS — L271 Localized skin eruption due to drugs and medicaments taken internally: Secondary | ICD-10-CM

## 2017-04-18 DIAGNOSIS — D6481 Anemia due to antineoplastic chemotherapy: Secondary | ICD-10-CM | POA: Diagnosis not present

## 2017-04-18 DIAGNOSIS — D649 Anemia, unspecified: Secondary | ICD-10-CM

## 2017-04-18 DIAGNOSIS — E871 Hypo-osmolality and hyponatremia: Secondary | ICD-10-CM

## 2017-04-18 DIAGNOSIS — C786 Secondary malignant neoplasm of retroperitoneum and peritoneum: Secondary | ICD-10-CM

## 2017-04-18 LAB — CBC WITH DIFFERENTIAL/PLATELET
BASOS ABS: 0 10*3/uL (ref 0–0.1)
BASOS PCT: 0 %
EOS PCT: 0 %
Eosinophils Absolute: 0 10*3/uL (ref 0–0.7)
HCT: 31.3 % — ABNORMAL LOW (ref 35.0–47.0)
Hemoglobin: 10.7 g/dL — ABNORMAL LOW (ref 12.0–16.0)
Lymphocytes Relative: 15 %
Lymphs Abs: 1 10*3/uL (ref 1.0–3.6)
MCH: 32.4 pg (ref 26.0–34.0)
MCHC: 34.2 g/dL (ref 32.0–36.0)
MCV: 94.8 fL (ref 80.0–100.0)
Monocytes Absolute: 0.3 10*3/uL (ref 0.2–0.9)
Monocytes Relative: 4 %
Neutro Abs: 5.2 10*3/uL (ref 1.4–6.5)
Neutrophils Relative %: 81 %
PLATELETS: 157 10*3/uL (ref 150–440)
RBC: 3.3 MIL/uL — ABNORMAL LOW (ref 3.80–5.20)
RDW: 15.5 % — ABNORMAL HIGH (ref 11.5–14.5)
WBC: 6.4 10*3/uL (ref 3.6–11.0)

## 2017-04-18 LAB — COMPREHENSIVE METABOLIC PANEL
ALBUMIN: 4.2 g/dL (ref 3.5–5.0)
ALK PHOS: 63 U/L (ref 38–126)
ALT: 16 U/L (ref 14–54)
AST: 26 U/L (ref 15–41)
Anion gap: 10 (ref 5–15)
BUN: 19 mg/dL (ref 6–20)
CALCIUM: 10 mg/dL (ref 8.9–10.3)
CO2: 21 mmol/L — ABNORMAL LOW (ref 22–32)
Chloride: 101 mmol/L (ref 101–111)
Creatinine, Ser: 0.63 mg/dL (ref 0.44–1.00)
GFR calc Af Amer: >60 mL/min (ref >60)
GFR calc non Af Amer: >60 mL/min (ref >60)
GLUCOSE: 206 mg/dL — AB (ref 65–99)
Potassium: 3.2 mmol/L — ABNORMAL LOW (ref 3.5–5.1)
Sodium: 132 mmol/L — ABNORMAL LOW (ref 135–145)
TOTAL PROTEIN: 7.2 g/dL (ref 6.5–8.1)
Total Bilirubin: 0.5 mg/dL (ref 0.3–1.2)

## 2017-04-18 MED ORDER — SODIUM CHLORIDE 0.9 % IV SOLN
175.0000 mg/m2 | Freq: Once | INTRAVENOUS | Status: AC
  Administered 2017-04-18: 276 mg via INTRAVENOUS
  Filled 2017-04-18: qty 46

## 2017-04-18 MED ORDER — SODIUM CHLORIDE 0.9% FLUSH
10.0000 mL | INTRAVENOUS | Status: DC | PRN
  Administered 2017-04-18: 10 mL via INTRAVENOUS
  Filled 2017-04-18: qty 10

## 2017-04-18 MED ORDER — POTASSIUM CHLORIDE 20 MEQ/100ML IV SOLN
20.0000 meq | Freq: Once | INTRAVENOUS | Status: AC
  Administered 2017-04-18: 20 meq via INTRAVENOUS
  Filled 2017-04-18: qty 100

## 2017-04-18 MED ORDER — PALONOSETRON HCL INJECTION 0.25 MG/5ML
0.2500 mg | Freq: Once | INTRAVENOUS | Status: AC
  Administered 2017-04-18: 0.25 mg via INTRAVENOUS
  Filled 2017-04-18: qty 5

## 2017-04-18 MED ORDER — DIPHENHYDRAMINE HCL 50 MG/ML IJ SOLN
50.0000 mg | Freq: Once | INTRAMUSCULAR | Status: AC
  Administered 2017-04-18: 50 mg via INTRAVENOUS
  Filled 2017-04-18: qty 1

## 2017-04-18 MED ORDER — SODIUM CHLORIDE 0.9 % IV SOLN
20.0000 mg | Freq: Once | INTRAVENOUS | Status: AC
  Administered 2017-04-18: 20 mg via INTRAVENOUS
  Filled 2017-04-18: qty 2

## 2017-04-18 MED ORDER — HEPARIN SOD (PORK) LOCK FLUSH 100 UNIT/ML IV SOLN: 500.0000 [IU] | Freq: Once | INTRAVENOUS | Status: AC

## 2017-04-18 MED ORDER — HEPARIN SOD (PORK) LOCK FLUSH 100 UNIT/ML IV SOLN
500.0000 [IU] | Freq: Once | INTRAVENOUS | Status: AC | PRN
  Administered 2017-04-18: 500 [IU]
  Filled 2017-04-18: qty 5

## 2017-04-18 MED ORDER — SODIUM CHLORIDE 0.9 % IV SOLN
Freq: Once | INTRAVENOUS | Status: AC
  Administered 2017-04-18: 11:00:00 via INTRAVENOUS
  Filled 2017-04-18: qty 1000

## 2017-04-18 MED ORDER — CARBOPLATIN CHEMO INJECTION 450 MG/45ML
356.0000 mg | Freq: Once | INTRAVENOUS | Status: AC
  Administered 2017-04-18: 360 mg via INTRAVENOUS
  Filled 2017-04-18: qty 36

## 2017-04-18 MED ORDER — FAMOTIDINE IN NACL 20-0.9 MG/50ML-% IV SOLN
20.0000 mg | Freq: Once | INTRAVENOUS | Status: AC
  Administered 2017-04-18: 20 mg via INTRAVENOUS
  Filled 2017-04-18: qty 50

## 2017-04-18 NOTE — Addendum Note (Signed)
Addended by: Earlie Server on: 04/18/2017 09:36 PM   Modules accepted: Orders

## 2017-04-19 DIAGNOSIS — C569 Malignant neoplasm of unspecified ovary: Secondary | ICD-10-CM | POA: Diagnosis not present

## 2017-04-27 ENCOUNTER — Encounter: Payer: Self-pay | Admitting: Obstetrics and Gynecology

## 2017-04-27 ENCOUNTER — Ambulatory Visit (INDEPENDENT_AMBULATORY_CARE_PROVIDER_SITE_OTHER): Payer: Medicare Other | Admitting: Obstetrics and Gynecology

## 2017-04-27 VITALS — BP 108/68 | HR 91 | Wt 117.0 lb

## 2017-04-27 DIAGNOSIS — Z09 Encounter for follow-up examination after completed treatment for conditions other than malignant neoplasm: Secondary | ICD-10-CM

## 2017-04-27 NOTE — Progress Notes (Signed)
      Postoperative Follow-up Patient presents post op from TAH, BSO, omentectomy, paraortic node resection 6weeks ago for ovarian cancer.  Subjective: Patient reports marked improvement in her preop symptoms. Eating a regular diet without difficulty. The patient is not having any pain.  Activity: normal activities of daily living.  Objective: Vitals:   04/27/17 0846  BP: 108/68  Pulse: 91   Gen: NAD Abdomen: soft, non-tender, non-distended, incision well healed Gu: normal external female genitalia, vaginal cuff intact and well healed, no nodularity or tenderness Ext: no edema  Assessment: 67 y.o. s/p TAH, BSO, omentectomy, paraortic node resection  stable  Plan: Patient has done well after surgery with no apparent complications.  I have discussed the post-operative course to date, and the expected progress moving forward.  The patient understands what complications to be concerned about.  I will see the patient in routine follow up, or sooner if needed.    Activity plan: No restriction. - F/U 6 months pelvic - One more round of chemo before completion  Katie Hill 04/27/2017, 1:16 PM

## 2017-05-08 NOTE — Progress Notes (Signed)
[facility] Follow up visit  Patient Care Team: Patient, [name], RN as Registered Nurse Gae Dry, MD as Referring Physician (Obstetrics and Gynecology)  CHIEF COMPLAINTS/PURPOSE OF Visit Follow up for Treatment of  ovarian cancer.  HISTORY OF PRESENTING ILLNESS: Katie Hill 67 y.o. female presents for follow up of management of stage IIIC ovarian cancer. She underwent  Neoadjuvant chemotherapy of carbo and taxol x 4  # Patient had debulking surgery on 03/14/2017. She had a laparoscopy with conversion to laparotomy, total hysterectomy, with bilateral salpingo oophorectomy, right aortic lymph node dissection, omentectomy. Pathology showed small foci of residual disease in ovary.   Genetic testing negative for83 genes on [facility]'s Multi-Cancer panel (ALK, APC, ATM, AXIN2, BAP1, BARD1, BLM, BMPR1A, BRCA1, BRCA2, BRIP1, CASR, CDC73, CDH1, CDK4, CDKN1B, CDKN1C, CDKN2A, CEBPA, CHEK2, CTNNA1, DICER1, DIS3L2, EGFR, EPCAM, FH, FLCN, GATA2, GPC3, GREM1, HOXB13, HRAS, KIT, MAX, MEN1, MET, MITF, MLH1, MSH2, MSH3, MSH6, MUTYH, NBN, NF1, NF2, NTHL1, PALB2, PDGFRA, PHOX2B, PMS2, POLD1, POLE, POT1, PRKAR1A, PTCH1, PTEN, RAD50, RAD51C, RAD51D, RB1, RECQL4, RET, RUNX1, SDHA, SDHAF2, SDHB, SDHC, SDHD, SMAD4, SMARCA4, SMARCB1, SMARCE1, STK11, SUFU, TERC, TERT, TMEM127, TP53, TSC1, TSC2, VHL, WRN, WT1).  A Variant of UncertainSignificancewas detected: [id number]>A (p.Gly36Arg).  Patient is currently getting adjuvant chemotherapy. Per GynOnc, recommend 3 cycles of adjuvant carbo/taxol.    Treatment:  #Carboplatin and taxol x 4 Adjuvant carbo and taxol plan total 3  INTERVAL HISTORY Patient presents for evaluation prior to adjuvant chemotherapy. She reports breaking rash 2 weeks after last chemotherapy, rash is on her trunk and back, ichy, similar to the rash she got previously during her first and second neoadjuvant  treatment. She took benadryl and rash resolved after 2 days.  Otherwise doing well. Good appetite. Less fatigued.     Review of Systems  Constitutional: Negative for appetite change, chills and fatigue.  HENT:   Negative for hearing loss and mouth sores.   Eyes: Negative for eye problems.  Respiratory: Negative for cough.   Cardiovascular: Negative for chest pain and leg swelling.  Gastrointestinal: Negative for abdominal distention and abdominal pain.       Very occassionally she has right low quadrant sharp pain, resolve spontaneously.   Endocrine: Negative for hot flashes.  Genitourinary: Negative for difficulty urinating and dysuria.   Musculoskeletal: Negative for arthralgias, back pain and gait problem.  Skin: Negative for itching.       Broke out rash on trunk, resolved now.   Neurological: Negative for dizziness, gait problem and speech difficulty.  Hematological: Negative for adenopathy.  Psychiatric/Behavioral: The patient is not nervous/anxious.     MEDICAL HISTORY: Past Medical History:  Diagnosis Date  . Dysrhythmia   . Genetic testing 03/28/2017   Multi-Cancer panel (83 genes) @ [facility] - No pathogenic mutations detected  . High grade ovarian cancer ([place]) 11/20/2016  . Pelvic mass in female     SURGICAL HISTORY: Past Surgical History:  Procedure Laterality Date  . APPENDECTOMY    . LAPAROSCOPY N/A 03/14/2017   Procedure: LAPAROSCOPY OPERATIVE;  Surgeon: Mellody Drown, MD;  Location: [facility];  Service: Gynecology;  Laterality: N/A;  . LAPAROTOMY N/A 03/14/2017   Procedure: LAPAROTOMY;  Surgeon: Mellody Drown, MD;  Location: [facility];  Service: Gynecology;  Laterality: N/A;  . LYMPH NODE DISSECTION N/A 03/14/2017   Procedure: LYMPH NODE DISSECTION;  Surgeon: Mellody Drown, MD;  Location: [facility];  Service: Gynecology;  Laterality:  N/A;  . OMENTECTOMY N/A 03/14/2017   Procedure: OMENTECTOMY;  Surgeon: Mellody Drown, MD;  Location: [facility];  Service:  Gynecology;  Laterality: N/A;  . PORTA CATH INSERTION N/A 11/27/2016   Procedure: Glori Luis Cath Insertion;  Surgeon: Algernon Huxley, MD;  Location: [place] [facility];  Service: Cardiovascular;  Laterality: N/A;    SOCIAL HISTORY: Social History   Socioeconomic History  . Marital status: Married    Spouse name: Not on file  . Number of children: Not on file  . Years of education: Not on file  . Highest education level: Not on file  Social Needs  . Financial resource strain: Not on file  . Food insecurity - worry: Not on file  . Food insecurity - inability: Not on file  . Transportation needs - medical: Not on file  . Transportation needs - non-medical: Not on file  Occupational History  . Not on file  Tobacco Use  . Smoking status: Never Smoker  . Smokeless tobacco: Never Used  Substance and Sexual Activity  . Alcohol use: No    Frequency: Never    Comment: use to be occ. but not any since 5 months  . Drug use: No  . Sexual activity: Yes    Birth control/protection: Post-menopausal  Other Topics Concern  . Not on file  Social History Narrative  . Not on file    FAMILY HISTORY Family History  Problem Relation Age of Onset  . Throat cancer Cousin   . Throat cancer Cousin     ALLERGIES:  is allergic to omeprazole.  MEDICATIONS:  Current Outpatient Medications  Medication Sig Dispense Refill  . calcium carbonate (TUMS - DOSED IN MG ELEMENTAL CALCIUM) 500 MG chewable tablet Chew 1 tablet by mouth as needed for indigestion or heartburn.    . dexamethasone (DECADRON) 4 MG tablet Take 5 tablets (20 mg) 2 days prior to chemo and 5 tablets (61m) 2 days after chemo. 20 tablet 3  . lidocaine-prilocaine (EMLA) cream Apply 1 application topically as needed. Apply small amount to port site at least 1 hour prior to it being accessed, cover with plastic wrap 30 g 1  . ondansetron (ZOFRAN) 4 MG tablet Take 1 tablet (4 mg total) by mouth daily as needed for nausea or vomiting. 30  tablet 0  . oxyCODONE-acetaminophen (PERCOCET/ROXICET) 5-325 MG tablet Take 1-2 tablets by mouth every 4 (four) hours as needed (moderate to severe pain (when tolerating fluids)). 30 tablet 0  . senna (SENOKOT) 8.6 MG TABS tablet Take 2 tablets (17.2 mg total) by mouth daily. Hold if loose stool. (Patient taking differently: Take 1 tablet by mouth at bedtime. Hold if loose stool.) 120 each 0  . Tetrahydrozoline HCl (REDNESS RELIEVER EYE DROPS OP) Place 1 drop into both eyes as needed (for red eyes).      No current facility-administered medications for this visit.    Facility-Administered Medications Ordered in Other Visits  Medication Dose Route Frequency Provider Last Rate Last Dose  . CARBOplatin (PARAPLATIN) 360 mg in sodium chloride 0.9 % 250 mL chemo infusion  360 mg Intravenous Once YEarlie Server MD      . heparin lock flush 100 unit/mL  500 Units Intravenous Once YEarlie Server MD      . heparin lock flush 100 unit/mL  500 Units Intracatheter Once PRN YEarlie Server MD      . PACLitaxel (TAXOL) 276 mg in sodium chloride 0.9 % 250 mL chemo infusion (> 823mm2)  175  mg/m2 (Treatment Plan Recorded) Intravenous Once Earlie Server, MD 99 mL/hr at 05/09/17 1010 276 mg at 05/09/17 1010  . sodium chloride flush (NS) 0.9 % injection 10 mL  10 mL Intravenous PRN Earlie Server, MD   10 mL at 05/09/17 0830  . sodium chloride flush (NS) 0.9 % injection 10 mL  10 mL Intracatheter PRN Earlie Server, MD        PHYSICAL EXAMINATION:  ECOG PERFORMANCE STATUS: 1 - Symptomatic but completely ambulatory   Vitals:   05/09/17 0848  BP: 126/70  Pulse: 69  Resp: 18  Temp: (!) 97.2 F (36.2 C)    Filed Weights   05/09/17 0848  Weight: 116 lb 8 oz (52.8 kg)     Physical Exam  Constitutional: She is oriented to person, place, and time and well-developed, well-nourished, and in no distress. No distress.  HENT:  Head: Normocephalic and atraumatic.  Eyes: EOM are normal. Pupils are equal, round, and reactive to light. No  scleral icterus.  Neck: Normal range of motion. Neck supple. No JVD present.  Cardiovascular:  No murmur heard. Pulmonary/Chest: Effort normal and breath sounds normal. No respiratory distress.  Abdominal: Soft. Bowel sounds are normal. She exhibits no distension.  Incision is well healed.  Musculoskeletal: Normal range of motion. She exhibits no edema.  Lymphadenopathy:    She has no cervical adenopathy.  Neurological: She is alert and oriented to person, place, and time.  Skin: Skin is warm and dry.  Psychiatric: Affect normal.       SKIN: LABORATORY DATA: I have personally reviewed the data as listed: CBC    Component Value Date/Time   WBC 6.4 04/18/2017 0855   RBC 3.30 (L) 04/18/2017 0855   HGB 10.7 (L) 04/18/2017 0855   HCT 31.3 (L) 04/18/2017 0855   PLT 157 04/18/2017 0855   MCV 94.8 04/18/2017 0855   MCH 32.4 04/18/2017 0855   MCHC 34.2 04/18/2017 0855   RDW 15.5 (H) 04/18/2017 0855   LYMPHSABS 1.0 04/18/2017 0855   MONOABS 0.3 04/18/2017 0855   EOSABS 0.0 04/18/2017 0855   BASOSABS 0.0 04/18/2017 0855   CMP Latest Ref Rng & Units 04/18/2017 03/28/2017 03/15/2017  Glucose 65 - 99 mg/dL 206(H) 84 138(H)  BUN 6 - 20 mg/dL '19 7 7  ' Creatinine 0.44 - 1.00 mg/dL 0.63 0.57 0.47  Sodium 135 - 145 mmol/L 132(L) 133(L) 128(L)  Potassium 3.5 - 5.1 mmol/L 3.2(L) 4.2 3.6  Chloride 101 - 111 mmol/L 101 102 98(L)  CO2 22 - 32 mmol/L 21(L) 24 21(L)  Calcium 8.9 - 10.3 mg/dL 10.0 9.5 8.9  Total Protein 6.5 - 8.1 g/dL 7.2 6.6 -  Total Bilirubin 0.3 - 1.2 mg/dL 0.5 0.4 -  Alkaline Phos 38 - 126 U/L 63 62 -  AST 15 - 41 U/L 26 19 -  ALT 14 - 54 U/L 16 10(L) -    pathology 11/16/2016 Surgical Pathology  CASE: [id number]  PATIENT: Makeba Mcgath  Surgical Pathology Report   SPECIMEN SUBMITTED:  A. Retroperitoneal adenopathy, left  DIAGNOSIS:  A. LYMPH NODE, LEFT RETROPERITONEAL; CT-GUIDED CORE BIOPSY:  - METASTATIC HIGH-GRADE SEROUS CARCINOMA.   Comment:    RADIOGRAPHIC STUDIES: I have personally reviewed the radiological images as listed and agree with the findings in the report CT chest abdomen pelvis with contrast 11/13/2016 IMPRESSION: 1. Large heterogeneous central pelvic mass measures up to 12.6 cm. The lesion generates substantial mass-effect on the uterus, bladder, and pelvic sidewall anatomy. The mass appears to  invade the mid sigmoid colon. Etiology of the central pelvic mass is not definitive by CT, but ovarian primary is suspected. The lesion becomes indistinguishable from the posterior uterus on some images and uterine origin is possible, but the uterus does not appear to be the epicenter of the mass and appears to be more displaced by it. MRI of the pelvis without and with contrast may prove helpful to better delineate the relationship of the uterus to the central pelvic mass although it may not be able to definitively localize the origin. 2. Bulky retroperitoneal and left pelvic sidewall lymphadenopathy. The retroperitoneal lymphadenopathy generates substantial mass-effect on the IVC and left renal vein. The external iliac veins along each pelvic sidewall are markedly attenuated by the mass/lymphadenopathy. Patency of these vessels cannot be definitely confirmed on this exam.  Pathology 03/14/2017   DIAGNOSIS:  A. OMENTUM; OMENTECTOMY:  - NO TUMOR SEEN.  - ONE NEGATIVE LYMPH NODE (0/1).   B. RIGHT FALLOPIAN TUBE AND OVARY; SALPINGO-OOPHORECTOMY:  - SMALL FOCI OF HIGH GRADE SEROUS CARCINOMA INVOLVING THE OVARY.  - MARKED THERAPY RELATED CHANGE.  - NO TUMOR SEEN IN THE FALLOPIAN TUBE.   C. UTERUS, CERVIX, LEFT FALLOPIAN TUBE AND OVARY; HYSTERECTOMY AND LEFT  SALPINGO-OOPHORECTOMY:  - NABOTHIAN CYSTS.  - CYSTIC ATROPHY OF THE ENDOMETRIUM.  - SEROSAL ADHESIONS.  - UNREMARKABLE FALLOPIAN TUBE.  - OVARY SHOWING TREATMENT RELATED CHANGE.   D. 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    ASSESSMENT/PLAN Cancer Staging Malignant neoplasm of ovary [facility]) Staging form: Ovary, Fallopian Tube, and Primary Peritoneal Carcinoma, AJCC 8th Edition - Clinical stage from 11/22/2016: Stage IIIC (cT3c, cN1b, cM0) - Signed by Earlie Server, MD on 11/22/2016  No diagnosis found. # OK to proceed to cycle 6 carbo and taxol.   # She has had rash due to chemotherapy.  Continue Dexamethason 58m for two days after chemotherapy to prevent skin toxicities. # Take Percocet as needed for localized pain. # Anemia is improving. Iron panel reviewd.  Follow-up on 3 weeks for final course of chemotherapy.repeat CA125. # Hypokalemia: give 244m KCL with chemotherapy today.  # Awaiting somatic mutation test for BRCA1/2 to see if eligible for Olarparib maintenance( SOLO1 data).   All questions were answered. The patient knows to call the clinic with any problems, questions or concerns. Follow up in 3 weeks. Lab, MD and Carbo Taxol.   ZhEarlie ServerMD, PhD Hematology Oncology [facility]- [phone]/29/2019 CoWoburnancer Follow up visit  Patient Care Team: Patient, No Pcp Per as PCP - General ([place] Heights[place]ent JacksRN as Registered Nurse HaGae DryMD as Referring Physician (Obstetrics and Gynecology)  CHIEF COMPLAINTS/PURPOSE OF Visit Follow up for Treatment of  ovarian cancer.  HISTORY OF PRESENTING ILLNESS: Katie RAMEY89.o. female presents for follow up of management of ovarian cancer. She is on Neoadjuvant chemotherapy.   On presentation, CT imaging revealed a large pelvic mass and extensive adenopathy. 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 Patient reports healthy at baseline and has not follow-up with any doctor for her life. She is married, G0P0 She denies any fatigue, weight loss, chest pain, cough, shortness  of breath, and lower extremity swelling. She noted some urinary symptoms with frequency of urination. She also mentioned that she intermittently pass some mucus in her stool.also some back pain which is  new.  Treatment:  #Carboplatin and taxol x 4 # Patient had debulking surgery on 03/14/2017. She had a laparoscopy with conversion to laparotomy, total hysterectomy, with bilateral salpingo oophorectomy, right aortic lymph node dissection, omentectomy. Pathology showed small foci of residual disease in ovary.   INTERVAL HISTORY Patient presents for evaluation prior to post surgery and adjuvant chemotherapy,   Today she reports feeling well. She has taken her steroid premeds. Denies any rash, chest pain, SOB, abdominal pain.  She has a good appetite. Fatigue is better   Review of Systems  Constitutional: Negative for appetite change, chills and fatigue.  HENT:   Negative for hearing loss and mouth sores.   Eyes: Negative for eye problems.  Respiratory: Negative for chest tightness and cough.   Cardiovascular: Negative for chest pain and leg swelling.  Gastrointestinal: Negative for abdominal distention and abdominal pain.  Endocrine: Negative for hot flashes.  Genitourinary: Negative for difficulty urinating and dysuria.   Musculoskeletal: Negative for arthralgias, back pain and gait problem.  Skin: Negative for itching.  Neurological: Negative for dizziness, gait problem and speech difficulty.  Hematological: Negative for adenopathy.  Psychiatric/Behavioral: The patient is not nervous/anxious.     MEDICAL HISTORY: Past Medical History:  Diagnosis Date  . Dysrhythmia   . Genetic testing 03/28/2017   Multi-Cancer panel (83 genes) @ [facility] - No pathogenic mutations detected  . High grade ovarian cancer ([place]) 11/20/2016  . Pelvic mass in female     SURGICAL HISTORY: Past Surgical History:  Procedure Laterality Date  . APPENDECTOMY    . LAPAROSCOPY N/A 03/14/2017   Procedure:  LAPAROSCOPY OPERATIVE;  Surgeon: Mellody Drown, MD;  Location: [facility];  Service: Gynecology;  Laterality: N/A;  . LAPAROTOMY N/A 03/14/2017   Procedure: LAPAROTOMY;  Surgeon: Mellody Drown, MD;  Location: [facility];  Service: Gynecology;  Laterality: N/A;  . LYMPH NODE DISSECTION N/A 03/14/2017   Procedure: LYMPH NODE DISSECTION;  Surgeon: Mellody Drown, MD;  Location: [facility];  Service: Gynecology;  Laterality: N/A;  . OMENTECTOMY N/A 03/14/2017   Procedure: OMENTECTOMY;  Surgeon: Mellody Drown, MD;  Location: [facility];  Service: Gynecology;  Laterality: N/A;  . PORTA CATH INSERTION N/A 11/27/2016   Procedure: Glori Luis Cath Insertion;  Surgeon: Algernon Huxley, MD;  Location: [place] [facility];  Service: Cardiovascular;  Laterality: N/A;    SOCIAL HISTORY: Social History   Socioeconomic History  . Marital status: Married    Spouse name: Not on file  . Number of children: Not on file  . Years of education: Not on file  . Highest education level: Not on file  Social Needs  . Financial resource strain: Not on file  . Food insecurity - worry: Not on file  . Food insecurity - inability: Not on file  . Transportation needs - medical: Not on file  . Transportation needs - non-medical: Not on file  Occupational History  . Not on file  Tobacco Use  . Smoking status: Never Smoker  . Smokeless tobacco: Never Used  Substance and Sexual Activity  . Alcohol use: No    Frequency: Never    Comment: use to be occ. but not any since 5 months  . Drug use: No  . Sexual activity: Yes    Birth control/protection: Post-menopausal  Other Topics Concern  . Not on file  Social History Narrative  . Not on file    FAMILY HISTORY Family History  Problem Relation Age of Onset  . Throat cancer Cousin   .  Throat cancer Cousin     ALLERGIES:  is allergic to omeprazole.  MEDICATIONS:  Current Outpatient Medications  Medication Sig Dispense Refill  . calcium carbonate (TUMS - DOSED IN MG  ELEMENTAL CALCIUM) 500 MG chewable tablet Chew 1 tablet by mouth as needed for indigestion or heartburn.    . dexamethasone (DECADRON) 4 MG tablet Take 5 tablets (20 mg) 2 days prior to chemo and 5 tablets (43m) 2 days after chemo. 20 tablet 3  . lidocaine-prilocaine (EMLA) cream Apply 1 application topically as needed. Apply small amount to port site at least 1 hour prior to it being accessed, cover with plastic wrap 30 g 1  . ondansetron (ZOFRAN) 4 MG tablet Take 1 tablet (4 mg total) by mouth daily as needed for nausea or vomiting. 30 tablet 0  . oxyCODONE-acetaminophen (PERCOCET/ROXICET) 5-325 MG tablet Take 1-2 tablets by mouth every 4 (four) hours as needed (moderate to severe pain (when tolerating fluids)). 30 tablet 0  . senna (SENOKOT) 8.6 MG TABS tablet Take 2 tablets (17.2 mg total) by mouth daily. Hold if loose stool. (Patient taking differently: Take 1 tablet by mouth at bedtime. Hold if loose stool.) 120 each 0  . Tetrahydrozoline HCl (REDNESS RELIEVER EYE DROPS OP) Place 1 drop into both eyes as needed (for red eyes).      No current facility-administered medications for this visit.     PHYSICAL EXAMINATION:  ECOG PERFORMANCE STATUS: 0 - Asymptomatic   Vitals:   05/09/17 0848  BP: 126/70  Pulse: 69  Resp: 18  Temp: (!) 97.2 F (36.2 C)    Filed Weights   05/09/17 0848  Weight: 116 lb 8 oz (52.8 kg)   GENERAL: No distress, well nourished.  SKIN:  No rashes or significant lesions  HEAD: Normocephalic, No masses, lesions, tenderness or abnormalities  EYES: Conjunctiva are pink, non icteric ENT: External ears normal ,lips , buccal mucosa, and tongue normal and mucous membranes are moist  LYMPH: No palpable cervical and axillary lymphadenopathy  LUNGS: Clear to auscultation, no crackles or wheezes HEART: Regular rate & rhythm, no murmurs, no gallops, S1 normal and S2 normal  ABDOMEN: Abdomen soft, non-tender, normal bowel sounds, I did not appreciate any  masses or  organomegaly Incision is well healed.  MUSCULOSKELETAL: No CVA tenderness and no tenderness on percussion of the back or rib cage.  EXTREMITIES: No edema, no skin discoloration or tenderness NEURO: Alert & oriented, no focal motor/sensory deficits.    LABORATORY DATA: I have personally reviewed the data as listed: CBC    Component Value Date/Time   WBC 6.4 04/18/2017 0855   RBC 3.30 (L) 04/18/2017 0855   HGB 10.7 (L) 04/18/2017 0855   HCT 31.3 (L) 04/18/2017 0855   PLT 157 04/18/2017 0855   MCV 94.8 04/18/2017 0855   MCH 32.4 04/18/2017 0855   MCHC 34.2 04/18/2017 0855   RDW 15.5 (H) 04/18/2017 0855   LYMPHSABS 1.0 04/18/2017 0855   MONOABS 0.3 04/18/2017 0855   EOSABS 0.0 04/18/2017 0855   BASOSABS 0.0 04/18/2017 0855   CMP Latest Ref Rng & Units 04/18/2017 03/28/2017 03/15/2017  Glucose 65 - 99 mg/dL 206(H) 84 138(H)  BUN 6 - 20 mg/dL '19 7 7  ' Creatinine 0.44 - 1.00 mg/dL 0.63 0.57 0.47  Sodium 135 - 145 mmol/L 132(L) 133(L) 128(L)  Potassium 3.5 - 5.1 mmol/L 3.2(L) 4.2 3.6  Chloride 101 - 111 mmol/L 101 102 98(L)  CO2 22 - 32 mmol/L 21(L) 24 21(L)  Calcium 8.9 - 10.3 mg/dL 10.0 9.5 8.9  Total Protein 6.5 - 8.1 g/dL 7.2 6.6 -  Total Bilirubin 0.3 - 1.2 mg/dL 0.5 0.4 -  Alkaline Phos 38 - 126 U/L 63 62 -  AST 15 - 41 U/L 26 19 -  ALT 14 - 54 U/L 16 10(L) -    pathology 11/16/2016 Surgical Pathology  CASE: [id number]  PATIENT: Katie Hill  Surgical Pathology Report   SPECIMEN SUBMITTED:  A. Retroperitoneal adenopathy, left  DIAGNOSIS:  A. LYMPH NODE, LEFT RETROPERITONEAL; CT-GUIDED CORE BIOPSY:  - METASTATIC HIGH-GRADE SEROUS CARCINOMA.   Comment:   RADIOGRAPHIC STUDIES: I have personally reviewed the radiological images as listed and agree with the findings in the report CT chest abdomen pelvis with contrast 11/13/2016 IMPRESSION: 1. Large heterogeneous central pelvic mass measures up to 12.6 cm. The lesion generates substantial mass-effect on the uterus,  bladder, and pelvic sidewall anatomy. The mass appears to invade the mid sigmoid colon. Etiology of the central pelvic mass is not definitive by CT, but ovarian primary is suspected. The lesion becomes indistinguishable from the posterior uterus on some images and uterine origin is possible, but the uterus does not appear to be the epicenter of the mass and appears to be more displaced by it. MRI of the pelvis without and with contrast may prove helpful to better delineate the relationship of the uterus to the central pelvic mass although it may not be able to definitively localize the origin. 2. Bulky retroperitoneal and left pelvic sidewall lymphadenopathy. The retroperitoneal lymphadenopathy generates substantial mass-effect on the IVC and left renal vein. The external iliac veins along each pelvic sidewall are markedly attenuated by the mass/lymphadenopathy. Patency of these vessels cannot be definitely confirmed on this exam.  Pathology 03/14/2017   DIAGNOSIS:  A. OMENTUM; OMENTECTOMY:  - NO TUMOR SEEN.  - ONE NEGATIVE LYMPH NODE (0/1).   B. RIGHT FALLOPIAN TUBE AND OVARY; SALPINGO-OOPHORECTOMY:  - SMALL FOCI OF HIGH GRADE SEROUS CARCINOMA INVOLVING THE OVARY.  - MARKED THERAPY RELATED CHANGE.  - NO TUMOR SEEN IN THE FALLOPIAN TUBE.   C. UTERUS, CERVIX, LEFT FALLOPIAN TUBE AND OVARY; HYSTERECTOMY AND LEFT  SALPINGO-OOPHORECTOMY:  - NABOTHIAN CYSTS.  - CYSTIC ATROPHY OF THE ENDOMETRIUM.  - SEROSAL ADHESIONS.  - UNREMARKABLE FALLOPIAN TUBE.  - OVARY SHOWING TREATMENT RELATED CHANGE.   D. 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     ASSESSMENT/PLAN Cancer Staging Malignant neoplasm of ovary [facility]) Staging form: Ovary, Fallopian Tube, and Primary Peritoneal Carcinoma, AJCC 8th Edition - Clinical stage from 11/22/2016: Stage IIIC (cT3c, cN1b, cM0) - Signed by Earlie Server, MD on 11/22/2016  1. High grade  ovarian cancer ([place])   2. Anemia associated with chemotherapy   3. Encounter for antineoplastic chemotherapy    # OK to proceed to cycle 7 carbo and taxol.   # Continue Dexamethason 60m for two days after chemotherapy to prevent skin toxicities. # CA125 pending.  # Anemia secondary to chemotherapy. Stable. monitor Follow-up on 3 week.  # Awaiting somatic mutation test for BRCA1/2 to see if eligible for Olarparib maintenance( SOLO1 data).   All questions were answered. The patient knows to call the clinic with any problems, questions or concerns. Follow up in 3 weeks. Lab, MD  ZEarlie Server MD, PhD Hematology Oncology CSummit Surgical LLCat ASoutheastern Regional Medical CenterPager- [phone]/30/2019

## 2017-05-09 ENCOUNTER — Inpatient Hospital Stay: Payer: Medicare Other

## 2017-05-09 ENCOUNTER — Inpatient Hospital Stay (HOSPITAL_BASED_OUTPATIENT_CLINIC_OR_DEPARTMENT_OTHER): Payer: Medicare Other | Admitting: Oncology

## 2017-05-09 ENCOUNTER — Encounter: Payer: Self-pay | Admitting: Oncology

## 2017-05-09 VITALS — BP 126/70 | HR 69 | Temp 97.2°F | Resp 18 | Wt 116.5 lb

## 2017-05-09 DIAGNOSIS — D6481 Anemia due to antineoplastic chemotherapy: Secondary | ICD-10-CM | POA: Diagnosis not present

## 2017-05-09 DIAGNOSIS — C569 Malignant neoplasm of unspecified ovary: Secondary | ICD-10-CM

## 2017-05-09 DIAGNOSIS — T451X5A Adverse effect of antineoplastic and immunosuppressive drugs, initial encounter: Secondary | ICD-10-CM

## 2017-05-09 DIAGNOSIS — C561 Malignant neoplasm of right ovary: Secondary | ICD-10-CM

## 2017-05-09 DIAGNOSIS — L271 Localized skin eruption due to drugs and medicaments taken internally: Secondary | ICD-10-CM | POA: Diagnosis not present

## 2017-05-09 DIAGNOSIS — D649 Anemia, unspecified: Secondary | ICD-10-CM

## 2017-05-09 DIAGNOSIS — Z5111 Encounter for antineoplastic chemotherapy: Secondary | ICD-10-CM | POA: Diagnosis not present

## 2017-05-09 DIAGNOSIS — E876 Hypokalemia: Secondary | ICD-10-CM | POA: Diagnosis not present

## 2017-05-09 DIAGNOSIS — C786 Secondary malignant neoplasm of retroperitoneum and peritoneum: Secondary | ICD-10-CM | POA: Diagnosis not present

## 2017-05-09 LAB — CBC WITH DIFFERENTIAL/PLATELET
Basophils Absolute: 0 10*3/uL (ref 0–0.1)
Basophils Relative: 0 %
Eosinophils Absolute: 0 10*3/uL (ref 0–0.7)
Eosinophils Relative: 0 %
HCT: 29.8 % — ABNORMAL LOW (ref 35.0–47.0)
HEMOGLOBIN: 10.4 g/dL — AB (ref 12.0–16.0)
LYMPHS ABS: 1.2 10*3/uL (ref 1.0–3.6)
LYMPHS PCT: 16 %
MCH: 32.3 pg (ref 26.0–34.0)
MCHC: 34.8 g/dL (ref 32.0–36.0)
MCV: 92.8 fL (ref 80.0–100.0)
Monocytes Absolute: 0.8 10*3/uL (ref 0.2–0.9)
Monocytes Relative: 11 %
NEUTROS ABS: 5.1 10*3/uL (ref 1.4–6.5)
NEUTROS PCT: 73 %
Platelets: 207 10*3/uL (ref 150–440)
RBC: 3.21 MIL/uL — AB (ref 3.80–5.20)
RDW: 15.4 % — ABNORMAL HIGH (ref 11.5–14.5)
WBC: 7.1 10*3/uL (ref 3.6–11.0)

## 2017-05-09 LAB — COMPREHENSIVE METABOLIC PANEL
ALT: 11 U/L — ABNORMAL LOW (ref 14–54)
AST: 17 U/L (ref 15–41)
Albumin: 3.9 g/dL (ref 3.5–5.0)
Alkaline Phosphatase: 55 U/L (ref 38–126)
Anion gap: 10 (ref 5–15)
BUN: 18 mg/dL (ref 6–20)
CHLORIDE: 101 mmol/L (ref 101–111)
CO2: 21 mmol/L — ABNORMAL LOW (ref 22–32)
Calcium: 9.8 mg/dL (ref 8.9–10.3)
Creatinine, Ser: 0.63 mg/dL (ref 0.44–1.00)
GFR calc Af Amer: >60 mL/min (ref >60)
Glucose, Bld: 88 mg/dL (ref 65–99)
POTASSIUM: 3.6 mmol/L (ref 3.5–5.1)
Sodium: 132 mmol/L — ABNORMAL LOW (ref 135–145)
Total Bilirubin: 0.3 mg/dL (ref 0.3–1.2)
Total Protein: 6.9 g/dL (ref 6.5–8.1)

## 2017-05-09 MED ORDER — SODIUM CHLORIDE 0.9 % IV SOLN
175.0000 mg/m2 | Freq: Once | INTRAVENOUS | Status: AC
  Administered 2017-05-09: 276 mg via INTRAVENOUS
  Filled 2017-05-09: qty 46

## 2017-05-09 MED ORDER — FAMOTIDINE IN NACL 20-0.9 MG/50ML-% IV SOLN
20.0000 mg | Freq: Once | INTRAVENOUS | Status: AC
  Administered 2017-05-09: 20 mg via INTRAVENOUS
  Filled 2017-05-09: qty 50

## 2017-05-09 MED ORDER — SODIUM CHLORIDE 0.9 % IV SOLN
Freq: Once | INTRAVENOUS | Status: AC
  Administered 2017-05-09: 09:00:00 via INTRAVENOUS
  Filled 2017-05-09: qty 1000

## 2017-05-09 MED ORDER — SODIUM CHLORIDE 0.9% FLUSH
10.0000 mL | INTRAVENOUS | Status: DC | PRN
  Administered 2017-05-09: 10 mL via INTRAVENOUS
  Filled 2017-05-09: qty 10

## 2017-05-09 MED ORDER — DIPHENHYDRAMINE HCL 50 MG/ML IJ SOLN
50.0000 mg | Freq: Once | INTRAMUSCULAR | Status: AC
  Administered 2017-05-09: 50 mg via INTRAVENOUS
  Filled 2017-05-09: qty 1

## 2017-05-09 MED ORDER — PALONOSETRON HCL INJECTION 0.25 MG/5ML
0.2500 mg | Freq: Once | INTRAVENOUS | Status: AC
  Administered 2017-05-09: 0.25 mg via INTRAVENOUS
  Filled 2017-05-09: qty 5

## 2017-05-09 MED ORDER — SODIUM CHLORIDE 0.9 % IV SOLN
20.0000 mg | Freq: Once | INTRAVENOUS | Status: AC
  Administered 2017-05-09: 20 mg via INTRAVENOUS
  Filled 2017-05-09: qty 2

## 2017-05-09 MED ORDER — SODIUM CHLORIDE 0.9% FLUSH
10.0000 mL | INTRAVENOUS | Status: DC | PRN
  Filled 2017-05-09: qty 10

## 2017-05-09 MED ORDER — HEPARIN SOD (PORK) LOCK FLUSH 100 UNIT/ML IV SOLN: 500.0000 [IU] | Freq: Once | INTRAVENOUS | Status: DC | PRN

## 2017-05-09 MED ORDER — SODIUM CHLORIDE 0.9 % IV SOLN
356.0000 mg | Freq: Once | INTRAVENOUS | Status: AC
  Administered 2017-05-09: 360 mg via INTRAVENOUS
  Filled 2017-05-09: qty 36

## 2017-05-09 MED ORDER — HEPARIN SOD (PORK) LOCK FLUSH 100 UNIT/ML IV SOLN
500.0000 [IU] | Freq: Once | INTRAVENOUS | Status: AC
  Administered 2017-05-09: 500 [IU] via INTRAVENOUS
  Filled 2017-05-09: qty 5

## 2017-05-10 LAB — CA 125: CANCER ANTIGEN (CA) 125: 7.4 U/mL (ref 0.0–38.1)

## 2017-05-15 ENCOUNTER — Encounter: Payer: Self-pay | Admitting: Oncology

## 2017-05-23 ENCOUNTER — Encounter: Payer: Self-pay | Admitting: Oncology

## 2017-05-29 NOTE — Progress Notes (Signed)
[facility] Follow up visit  Patient Care Team: Patient, [name], RN as Registered Nurse Gae Dry, MD as Referring Physician (Obstetrics and Gynecology)  CHIEF COMPLAINTS/PURPOSE OF Visit Follow up for Treatment of  ovarian cancer.  HISTORY OF PRESENTING ILLNESS: Katie Hill 67 y.o. female presents for follow up of management of stage IIIC ovarian cancer. She underwent  Neoadjuvant chemotherapy of carbo and taxol x 4  # Patient had debulking surgery on 03/14/2017. She had a laparoscopy with conversion to laparotomy, total hysterectomy, with bilateral salpingo oophorectomy, right aortic lymph node dissection, omentectomy. Pathology showed small foci of residual disease in ovary.   Genetic testing negative for83 genes on [facility]'s Multi-Cancer panel (ALK, APC, ATM, AXIN2, BAP1, BARD1, BLM, BMPR1A, BRCA1, BRCA2, BRIP1, CASR, CDC73, CDH1, CDK4, CDKN1B, CDKN1C, CDKN2A, CEBPA, CHEK2, CTNNA1, DICER1, DIS3L2, EGFR, EPCAM, FH, FLCN, GATA2, GPC3, GREM1, HOXB13, HRAS, KIT, MAX, MEN1, MET, MITF, MLH1, MSH2, MSH3, MSH6, MUTYH, NBN, NF1, NF2, NTHL1, PALB2, PDGFRA, PHOX2B, PMS2, POLD1, POLE, POT1, PRKAR1A, PTCH1, PTEN, RAD50, RAD51C, RAD51D, RB1, RECQL4, RET, RUNX1, SDHA, SDHAF2, SDHB, SDHC, SDHD, SMAD4, SMARCA4, SMARCB1, SMARCE1, STK11, SUFU, TERC, TERT, TMEM127, TP53, TSC1, TSC2, VHL, WRN, WT1).  A Variant of UncertainSignificancewas detected: [id number].106G>A (p.Gly36Arg). Myraid testing negative for somatic BRACA1/2, positive for HRD.    Treatment:  #s/p Carboplatin and taxol x 4 neoadjuvant chemotherapy followed by debulking surgery # S/p Adjuvant carbo and taxol x3    INTERVAL HISTORY Patient presents for evaluation after last cycle of chemotherapy and discuss about molecular testing. . She is doing well, good appetite, less fatigue.  Rash has resolved.   Review of Systems  Constitutional: Negative for  appetite change, chills, fatigue and unexpected weight change.  HENT:   Negative for hearing loss and mouth sores.   Eyes: Negative for eye problems.  Respiratory: Negative for cough.   Cardiovascular: Negative for chest pain and leg swelling.  Gastrointestinal: Negative for abdominal distention and abdominal pain.       Very occassionally she has right low quadrant sharp pain, resolve spontaneously.   Endocrine: Negative for hot flashes.  Genitourinary: Negative for difficulty urinating and dysuria.   Musculoskeletal: Negative for arthralgias, back pain and gait problem.  Skin: Negative for itching.  Neurological: Negative for dizziness, gait problem and speech difficulty.  Hematological: Negative for adenopathy.  Psychiatric/Behavioral: The patient is not nervous/anxious.     MEDICAL HISTORY: Past Medical History:  Diagnosis Date  . Dysrhythmia   . Genetic testing 03/28/2017   Multi-Cancer panel (83 genes) @ [facility] - No pathogenic mutations detected  . High grade ovarian cancer ([place]) 11/20/2016  . Pelvic mass in female     SURGICAL HISTORY: Past Surgical History:  Procedure Laterality Date  . APPENDECTOMY    . LAPAROSCOPY N/A 03/14/2017   Procedure: LAPAROSCOPY OPERATIVE;  Surgeon: Mellody Drown, MD;  Location: [facility];  Service: Gynecology;  Laterality: N/A;  . LAPAROTOMY N/A 03/14/2017   Procedure: LAPAROTOMY;  Surgeon: Mellody Drown, MD;  Location: [facility];  Service: Gynecology;  Laterality: N/A;  . LYMPH NODE DISSECTION N/A 03/14/2017   Procedure: LYMPH NODE DISSECTION;  Surgeon: Mellody Drown, MD;  Location: [facility];  Service: Gynecology;  Laterality: N/A;  . OMENTECTOMY N/A 03/14/2017   Procedure: OMENTECTOMY;  Surgeon: Mellody Drown, MD;  Location: [facility];  Service: Gynecology;  Laterality: N/A;  . PORTA CATH INSERTION N/A 11/27/2016   Procedure: Glori Luis Cath Insertion;  Surgeon: Algernon Huxley, MD;  Location: [facility];  Service: Cardiovascular;   Laterality: N/A;    SOCIAL HISTORY: Social History   Socioeconomic History  . Marital status: Married    Spouse name: Not on file  . Number of children: Not on file  . Years of education: Not on file  . Highest education level: Not on file  Social Needs  . Financial resource strain: Not on file  . Food insecurity - worry: Not on file  . Food insecurity - inability: Not on file  . Transportation needs - medical: Not on file  . Transportation needs - non-medical: Not on file  Occupational History  . Not on file  Tobacco Use  . Smoking status: Never Smoker  . Smokeless tobacco: Never Used  Substance and Sexual Activity  . Alcohol use: No    Frequency: Never    Comment: use to be occ. but not any since 5 months  . Drug use: No  . Sexual activity: Yes    Birth control/protection: Post-menopausal  Other Topics Concern  . Not on file  Social History Narrative  . Not on file    FAMILY HISTORY Family History  Problem Relation Age of Onset  . Throat cancer Cousin   . Throat cancer Cousin     ALLERGIES:  is allergic to omeprazole.  MEDICATIONS:  Current Outpatient Medications  Medication Sig Dispense Refill  . calcium carbonate (TUMS - DOSED IN MG ELEMENTAL CALCIUM) 500 MG chewable tablet Chew 1 tablet by mouth as needed for indigestion or heartburn.    . dexamethasone (DECADRON) 4 MG tablet Take 5 tablets (20 mg) 2 days prior to chemo and 5 tablets (87m) 2 days after chemo. 20 tablet 3  . lidocaine-prilocaine (EMLA) cream Apply 1 application topically as needed. Apply small amount to port site at least 1 hour prior to it being accessed, cover with plastic wrap 30 g 1  . ondansetron (ZOFRAN) 4 MG tablet Take 1 tablet (4 mg total) by mouth daily as needed for nausea or vomiting. 30 tablet 0  . oxyCODONE-acetaminophen (PERCOCET/ROXICET) 5-325 MG tablet Take 1-2 tablets by mouth every 4 (four) hours as needed (moderate to severe pain (when tolerating fluids)). 30 tablet 0  .  senna (SENOKOT) 8.6 MG TABS tablet Take 2 tablets (17.2 mg total) by mouth daily. Hold if loose stool. (Patient taking differently: Take 1 tablet by mouth at bedtime. Hold if loose stool.) 120 each 0  . Tetrahydrozoline HCl (REDNESS RELIEVER EYE DROPS OP) Place 1 drop into both eyes as needed (for red eyes).      No current facility-administered medications for this visit.     PHYSICAL EXAMINATION:  ECOG PERFORMANCE STATUS: 1 - Symptomatic but completely ambulatory   There were no vitals filed for this visit.  There were no vitals filed for this visit.   Physical Exam  Constitutional: She is oriented to person, place, and time and well-developed, well-nourished, and in no distress. No distress.  HENT:  Head: Normocephalic and atraumatic.  Eyes: EOM are normal. Pupils are equal, round, and reactive to light. No scleral icterus.  Neck: Normal range of motion. Neck supple. No JVD present.  Cardiovascular:  No murmur heard. Pulmonary/Chest: Effort normal and breath sounds normal. No respiratory distress.  Abdominal: Soft. Bowel sounds are normal. She exhibits no distension.  Incision is well healed.  Musculoskeletal: Normal range of motion. She exhibits no edema.  Lymphadenopathy:    She has no cervical  adenopathy.  Neurological: She is alert and oriented to person, place, and time.  Skin: Skin is warm and dry.  Psychiatric: Affect normal.       SKIN: LABORATORY DATA: I have personally reviewed the data as listed: CBC    Component Value Date/Time   WBC 7.1 05/09/2017 0835   RBC 3.21 (L) 05/09/2017 0835   HGB 10.4 (L) 05/09/2017 0835   HCT 29.8 (L) 05/09/2017 0835   PLT 207 05/09/2017 0835   MCV 92.8 05/09/2017 0835   MCH 32.3 05/09/2017 0835   MCHC 34.8 05/09/2017 0835   RDW 15.4 (H) 05/09/2017 0835   LYMPHSABS 1.2 05/09/2017 0835   MONOABS 0.8 05/09/2017 0835   EOSABS 0.0 05/09/2017 0835   BASOSABS 0.0 05/09/2017 0835   CMP Latest Ref Rng & Units 05/09/2017 04/18/2017  03/28/2017  Glucose 65 - 99 mg/dL 88 206(H) 84  BUN 6 - 20 mg/dL '18 19 7  ' Creatinine 0.44 - 1.00 mg/dL 0.63 0.63 0.57  Sodium 135 - 145 mmol/L 132(L) 132(L) 133(L)  Potassium 3.5 - 5.1 mmol/L 3.6 3.2(L) 4.2  Chloride 101 - 111 mmol/L 101 101 102  CO2 22 - 32 mmol/L 21(L) 21(L) 24  Calcium 8.9 - 10.3 mg/dL 9.8 10.0 9.5  Total Protein 6.5 - 8.1 g/dL 6.9 7.2 6.6  Total Bilirubin 0.3 - 1.2 mg/dL 0.3 0.5 0.4  Alkaline Phos 38 - 126 U/L 55 63 62  AST 15 - 41 U/L '17 26 19  ' ALT 14 - 54 U/L 11(L) 16 10(L)    pathology 11/16/2016 Surgical Pathology  CASE: [id number]  PATIENT: Hadleigh Gutknecht  Surgical Pathology Report   SPECIMEN SUBMITTED:  A. Retroperitoneal adenopathy, left  DIAGNOSIS:  A. LYMPH NODE, LEFT RETROPERITONEAL; CT-GUIDED CORE BIOPSY:  - METASTATIC HIGH-GRADE SEROUS CARCINOMA.   Comment:   RADIOGRAPHIC STUDIES: I have personally reviewed the radiological images as listed and agree with the findings in the report CT chest abdomen pelvis with contrast 11/13/2016 IMPRESSION: 1. Large heterogeneous central pelvic mass measures up to 12.6 cm. The lesion generates substantial mass-effect on the uterus, bladder, and pelvic sidewall anatomy. The mass appears to invade the mid sigmoid colon. Etiology of the central pelvic mass is not definitive by CT, but ovarian primary is suspected. The lesion becomes indistinguishable from the posterior uterus on some images and uterine origin is possible, but the uterus does not appear to be the epicenter of the mass and appears to be more displaced by it. MRI of the pelvis without and with contrast may prove helpful to better delineate the relationship of the uterus to the central pelvic mass although it may not be able to definitively localize the origin. 2. Bulky retroperitoneal and left pelvic sidewall lymphadenopathy. The retroperitoneal lymphadenopathy generates substantial mass-effect on the IVC and left renal vein. The external iliac  veins along each pelvic sidewall are markedly attenuated by the mass/lymphadenopathy. Patency of these vessels cannot be definitely confirmed on this exam.  Pathology 03/14/2017   DIAGNOSIS:  A. OMENTUM; OMENTECTOMY:  - NO TUMOR SEEN.  - ONE NEGATIVE LYMPH NODE (0/1).   B. RIGHT FALLOPIAN TUBE AND OVARY; SALPINGO-OOPHORECTOMY:  - SMALL FOCI OF HIGH GRADE SEROUS CARCINOMA INVOLVING THE OVARY.  - MARKED THERAPY RELATED CHANGE.  - NO TUMOR SEEN IN THE FALLOPIAN TUBE.   C. UTERUS, CERVIX, LEFT FALLOPIAN TUBE AND OVARY; HYSTERECTOMY AND LEFT  SALPINGO-OOPHORECTOMY:  - NABOTHIAN CYSTS.  - CYSTIC ATROPHY OF THE ENDOMETRIUM.  - SEROSAL ADHESIONS.  - UNREMARKABLE FALLOPIAN TUBE.  -  OVARY SHOWING TREATMENT RELATED CHANGE.   D. 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    ASSESSMENT/PLAN Cancer Staging Malignant neoplasm of ovary [facility]) Staging form: Ovary, Fallopian Tube, and Primary Peritoneal Carcinoma, AJCC 8th Edition - Clinical stage from 11/22/2016: Stage IIIC (cT3c, cN1b, cM0) - Signed by Earlie Server, MD on 11/22/2016  No diagnosis found. # OK to proceed to cycle 6 carbo and taxol.   # She has had rash due to chemotherapy.  Continue Dexamethason 5m for two days after chemotherapy to prevent skin toxicities. # Take Percocet as needed for localized pain. # Anemia is improving. Iron panel reviewd.  Follow-up on 3 weeks for final course of chemotherapy.repeat CA125. # Hypokalemia: give 281m KCL with chemotherapy today.  # Awaiting somatic mutation test for BRCA1/2 to see if eligible for Olarparib maintenance( SOLO1 data).   All questions were answered. The patient knows to call the clinic with any problems, questions or concerns. Follow up in 3 weeks. Lab, MD and Carbo Taxol.   ZhEarlie ServerMD, PhD Hematology Oncology [facility]- [phone]/19/2019 [facility] Follow up visit  Patient Care Team: Patient, No Pcp Per as PCP - General (GeTurinStClent JacksRN as Registered Nurse HaGae DryMD as Referring Physician (Obstetrics and Gynecology)  CHIEF COMPLAINTS/PURPOSE OF Visit Follow up for Treatment of  ovarian cancer.  HISTORY OF PRESENTING ILLNESS: Katie MADEJ89.o. female presents for follow up of management of ovarian cancer. She is on Neoadjuvant chemotherapy.   On presentation, CT imaging revealed a large pelvic mass and extensive adenopathy. 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 Patient reports healthy at baseline and has not follow-up with any doctor for her life. She is married, G0P0 She denies any fatigue, weight loss, chest pain, cough, shortness of breath, and lower extremity swelling. She noted some urinary symptoms with frequency of urination. She also mentioned that she intermittently pass some mucus in her stool.also some back pain which is new.  Treatment:  #Carboplatin and taxol x 4 # Patient had debulking surgery on 03/14/2017. She had a laparoscopy with conversion to laparotomy, total hysterectomy, with bilateral salpingo oophorectomy, right aortic lymph node dissection, omentectomy. Pathology showed small foci of residual disease in ovary.   INTERVAL HISTORY Patient presents for evaluation prior to post surgery and adjuvant chemotherapy,   Today she reports feeling well. She has taken her steroid premeds. Denies any rash, chest pain, SOB, abdominal pain.  She has a good appetite. Fatigue is better   Review of Systems  Constitutional: Negative for appetite change, chills and fatigue.  HENT:   Negative for hearing loss and mouth sores.   Eyes: Negative for eye problems.  Respiratory: Negative for chest tightness and cough.   Cardiovascular: Negative for chest pain and leg swelling.  Gastrointestinal: Negative for  abdominal distention and abdominal pain.  Endocrine: Negative for hot flashes.  Genitourinary: Negative for difficulty urinating and dysuria.   Musculoskeletal: Negative for arthralgias, back pain and gait problem.  Skin: Negative for itching.  Neurological: Negative for dizziness, gait problem and speech difficulty.  Hematological: Negative for adenopathy.  Psychiatric/Behavioral: The patient is not nervous/anxious.     MEDICAL HISTORY: Past Medical History:  Diagnosis Date  . Dysrhythmia   . Genetic testing 03/28/2017   Multi-Cancer panel (83 genes) @ [facility] - No pathogenic mutations  detected  . High grade ovarian cancer ([place]) 11/20/2016  . Pelvic mass in female     SURGICAL HISTORY: Past Surgical History:  Procedure Laterality Date  . APPENDECTOMY    . LAPAROSCOPY N/A 03/14/2017   Procedure: LAPAROSCOPY OPERATIVE;  Surgeon: Mellody Drown, MD;  Location: [facility];  Service: Gynecology;  Laterality: N/A;  . LAPAROTOMY N/A 03/14/2017   Procedure: LAPAROTOMY;  Surgeon: Mellody Drown, MD;  Location: [facility];  Service: Gynecology;  Laterality: N/A;  . LYMPH NODE DISSECTION N/A 03/14/2017   Procedure: LYMPH NODE DISSECTION;  Surgeon: Mellody Drown, MD;  Location: [facility];  Service: Gynecology;  Laterality: N/A;  . OMENTECTOMY N/A 03/14/2017   Procedure: OMENTECTOMY;  Surgeon: Mellody Drown, MD;  Location: [facility];  Service: Gynecology;  Laterality: N/A;  . PORTA CATH INSERTION N/A 11/27/2016   Procedure: Glori Luis Cath Insertion;  Surgeon: Algernon Huxley, MD;  Location: [facility];  Service: Cardiovascular;  Laterality: N/A;    SOCIAL HISTORY: Social History   Socioeconomic History  . Marital status: Married    Spouse name: Not on file  . Number of children: Not on file  . Years of education: Not on file  . Highest education level: Not on file  Social Needs  . Financial resource strain: Not on file  . Food insecurity - worry: Not on file  . Food insecurity -  inability: Not on file  . Transportation needs - medical: Not on file  . Transportation needs - non-medical: Not on file  Occupational History  . Not on file  Tobacco Use  . Smoking status: Never Smoker  . Smokeless tobacco: Never Used  Substance and Sexual Activity  . Alcohol use: No    Frequency: Never    Comment: use to be occ. but not any since 5 months  . Drug use: No  . Sexual activity: Yes    Birth control/protection: Post-menopausal  Other Topics Concern  . Not on file  Social History Narrative  . Not on file    FAMILY HISTORY Family History  Problem Relation Age of Onset  . Throat cancer Cousin   . Throat cancer Cousin     ALLERGIES:  is allergic to omeprazole.  MEDICATIONS:  Current Outpatient Medications  Medication Sig Dispense Refill  . calcium carbonate (TUMS - DOSED IN MG ELEMENTAL CALCIUM) 500 MG chewable tablet Chew 1 tablet by mouth as needed for indigestion or heartburn.    . lidocaine-prilocaine (EMLA) cream Apply 1 application topically as needed. Apply small amount to port site at least 1 hour prior to it being accessed, cover with plastic wrap 30 g 1  . ondansetron (ZOFRAN) 4 MG tablet Take 1 tablet (4 mg total) by mouth daily as needed for nausea or vomiting. 30 tablet 0  . senna (SENOKOT) 8.6 MG TABS tablet Take 2 tablets (17.2 mg total) by mouth daily. Hold if loose stool. (Patient taking differently: Take 1 tablet by mouth at bedtime. Hold if loose stool.) 120 each 0  . Tetrahydrozoline HCl (REDNESS RELIEVER EYE DROPS OP) Place 1 drop into both eyes as needed (for red eyes).     [name] oxyCODONE-acetaminophen (PERCOCET/ROXICET) 5-325 MG tablet Take 1-2 tablets by mouth every 4 (four) hours as needed (moderate to severe pain (when tolerating fluids)). (Patient not taking: Reported on 05/30/2017) 30 tablet 0   No current facility-administered medications for this visit.     PHYSICAL EXAMINATION:  ECOG PERFORMANCE STATUS: 0 - Asymptomatic   Vitals:  05/30/17 1058  BP: 101/66  Pulse: 76  Temp: (!) 96.4 F (35.8 C)    Filed Weights   05/30/17 1058  Weight: 118 lb 9 oz (53.8 kg)   GENERAL: No distress, well nourished.  SKIN:  No rashes or significant lesions  HEAD: Normocephalic, No masses, lesions, tenderness or abnormalities  EYES: Conjunctiva are pink, non icteric ENT: External ears normal ,lips , buccal mucosa, and tongue normal and mucous membranes are moist  LYMPH: No palpable cervical and axillary lymphadenopathy  LUNGS: Clear to auscultation, no crackles or wheezes HEART: Regular rate & rhythm, no murmurs, no gallops, S1 normal and S2 normal  ABDOMEN: Abdomen soft, non-tender, normal bowel sounds, I did not appreciate any  masses or organomegaly. Incision is well healed.  MUSCULOSKELETAL: No CVA tenderness and no tenderness on percussion of the back or rib cage.  EXTREMITIES: No edema, no skin discoloration or tenderness NEURO: Alert & oriented, no focal motor/sensory deficits.    LABORATORY DATA: I have personally reviewed the data as listed: CBC    Component Value Date/Time   WBC 3.2 (L) 05/30/2017 1030   RBC 3.50 (L) 05/30/2017 1030   HGB 11.0 (L) 05/30/2017 1030   HCT 32.3 (L) 05/30/2017 1030   PLT 145 (L) 05/30/2017 1030   MCV 92.5 05/30/2017 1030   MCH 31.5 05/30/2017 1030   MCHC 34.1 05/30/2017 1030   RDW 16.5 (H) 05/30/2017 1030   LYMPHSABS 1.3 05/30/2017 1030   MONOABS 0.4 05/30/2017 1030   EOSABS 0.2 05/30/2017 1030   BASOSABS 0.0 05/30/2017 1030   CMP Latest Ref Rng & Units 05/30/2017 05/09/2017 04/18/2017  Glucose 65 - 99 mg/dL 113(H) 88 206(H)  BUN 6 - 20 mg/dL '9 18 19  ' Creatinine 0.44 - 1.00 mg/dL 0.58 0.63 0.63  Sodium 135 - 145 mmol/L 133(L) 132(L) 132(L)  Potassium 3.5 - 5.1 mmol/L 3.8 3.6 3.2(L)  Chloride 101 - 111 mmol/L 103 101 101  CO2 22 - 32 mmol/L 24 21(L) 21(L)  Calcium 8.9 - 10.3 mg/dL 9.8 9.8 10.0  Total Protein 6.5 - 8.1 g/dL 7.0 6.9 7.2  Total Bilirubin 0.3 - 1.2 mg/dL 0.5 0.3  0.5  Alkaline Phos 38 - 126 U/L 62 55 63  AST 15 - 41 U/L '17 17 26  ' ALT 14 - 54 U/L 12(L) 11(L) 16    pathology 11/16/2016 Surgical Pathology  CASE: [id number]  PATIENT: Katie Hill  Surgical Pathology Report   SPECIMEN SUBMITTED:  A. Retroperitoneal adenopathy, left  DIAGNOSIS:  A. LYMPH NODE, LEFT RETROPERITONEAL; CT-GUIDED CORE BIOPSY:  - METASTATIC HIGH-GRADE SEROUS CARCINOMA.   Comment:   RADIOGRAPHIC STUDIES: I have personally reviewed the radiological images as listed and agree with the findings in the report CT chest abdomen pelvis with contrast 11/13/2016 IMPRESSION: 1. Large heterogeneous central pelvic mass measures up to 12.6 cm. The lesion generates substantial mass-effect on the uterus, bladder, and pelvic sidewall anatomy. The mass appears to invade the mid sigmoid colon. Etiology of the central pelvic mass is not definitive by CT, but ovarian primary is suspected. The lesion becomes indistinguishable from the posterior uterus on some images and uterine origin is possible, but the uterus does not appear to be the epicenter of the mass and appears to be more displaced by it. MRI of the pelvis without and with contrast may prove helpful to better delineate the relationship of the uterus to the central pelvic mass although it may not be able to definitively localize the origin. 2. Bulky retroperitoneal  and left pelvic sidewall lymphadenopathy. The retroperitoneal lymphadenopathy generates substantial mass-effect on the IVC and left renal vein. The external iliac veins along each pelvic sidewall are markedly attenuated by the mass/lymphadenopathy. Patency of these vessels cannot be definitely confirmed on this exam.  Pathology 03/14/2017   DIAGNOSIS:  A. OMENTUM; OMENTECTOMY:  - NO TUMOR SEEN.  - ONE NEGATIVE LYMPH NODE (0/1).   B. RIGHT FALLOPIAN TUBE AND OVARY; SALPINGO-OOPHORECTOMY:  - SMALL FOCI OF HIGH GRADE SEROUS CARCINOMA INVOLVING THE OVARY.  - MARKED  THERAPY RELATED CHANGE.  - NO TUMOR SEEN IN THE FALLOPIAN TUBE.   C. UTERUS, CERVIX, LEFT FALLOPIAN TUBE AND OVARY; HYSTERECTOMY AND LEFT  SALPINGO-OOPHORECTOMY:  - NABOTHIAN CYSTS.  - CYSTIC ATROPHY OF THE ENDOMETRIUM.  - SEROSAL ADHESIONS.  - UNREMARKABLE FALLOPIAN TUBE.  - OVARY SHOWING TREATMENT RELATED CHANGE.   D. 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     ASSESSMENT/PLAN Cancer Staging Malignant neoplasm of ovary [facility]) Staging form: Ovary, Fallopian Tube, and Primary Peritoneal Carcinoma, AJCC 8th Edition - Clinical stage from 11/22/2016: Stage IIIC (cT3c, cN1b, cM0) - Signed by Earlie Server, MD on 11/22/2016  1. Malignant neoplasm of ovary, unspecified laterality ([place])   2. Normocytic anemia   3 Homologous recombination deficiency  [facility] testing results were discussed with patient. She has HRD, negative for somatic or genetic BRCA mutation, upfront olarparib is not approved in HRD setting.  I have discussed with Dr.Seachord and Dr.Berchuk, there is a trial at [facility] for upfront Parp inhibitor first line maintenance.  Patient is motivated and willing to try.  RN navigator [place] will contact [facility] clinical trial for screening patient.   Follow-up on 3 months with repeat labs/ tumor marker.    All questions were answered. The patient knows to call the clinic with any problems, questions or concerns. Earlie Server, MD, PhD Hematology Oncology [facility] at [facility] Pager- [phone] 05/30/2017

## 2017-05-30 ENCOUNTER — Inpatient Hospital Stay (HOSPITAL_BASED_OUTPATIENT_CLINIC_OR_DEPARTMENT_OTHER): Payer: Medicare Other | Admitting: Oncology

## 2017-05-30 ENCOUNTER — Other Ambulatory Visit: Payer: Self-pay

## 2017-05-30 ENCOUNTER — Encounter: Payer: Self-pay | Admitting: Oncology

## 2017-05-30 ENCOUNTER — Inpatient Hospital Stay: Payer: Medicare Other | Attending: Oncology

## 2017-05-30 VITALS — BP 101/66 | HR 76 | Temp 96.4°F | Wt 118.6 lb

## 2017-05-30 DIAGNOSIS — C561 Malignant neoplasm of right ovary: Secondary | ICD-10-CM | POA: Insufficient documentation

## 2017-05-30 DIAGNOSIS — R21 Rash and other nonspecific skin eruption: Secondary | ICD-10-CM

## 2017-05-30 DIAGNOSIS — D649 Anemia, unspecified: Secondary | ICD-10-CM

## 2017-05-30 DIAGNOSIS — C569 Malignant neoplasm of unspecified ovary: Secondary | ICD-10-CM

## 2017-05-30 DIAGNOSIS — E876 Hypokalemia: Secondary | ICD-10-CM | POA: Diagnosis not present

## 2017-05-30 LAB — COMPREHENSIVE METABOLIC PANEL
ALBUMIN: 4.1 g/dL (ref 3.5–5.0)
ALT: 12 U/L — ABNORMAL LOW (ref 14–54)
ANION GAP: 6 (ref 5–15)
AST: 17 U/L (ref 15–41)
Alkaline Phosphatase: 62 U/L (ref 38–126)
BILIRUBIN TOTAL: 0.5 mg/dL (ref 0.3–1.2)
BUN: 9 mg/dL (ref 6–20)
CO2: 24 mmol/L (ref 22–32)
Calcium: 9.8 mg/dL (ref 8.9–10.3)
Chloride: 103 mmol/L (ref 101–111)
Creatinine, Ser: 0.58 mg/dL (ref 0.44–1.00)
GFR calc Af Amer: >60 mL/min (ref >60)
GFR calc non Af Amer: >60 mL/min (ref >60)
GLUCOSE: 113 mg/dL — AB (ref 65–99)
POTASSIUM: 3.8 mmol/L (ref 3.5–5.1)
SODIUM: 133 mmol/L — AB (ref 135–145)
TOTAL PROTEIN: 7 g/dL (ref 6.5–8.1)

## 2017-05-30 LAB — CBC WITH DIFFERENTIAL/PLATELET
BASOS ABS: 0 10*3/uL (ref 0–0.1)
Basophils Relative: 1 %
Eosinophils Absolute: 0.2 10*3/uL (ref 0–0.7)
Eosinophils Relative: 6 %
HEMATOCRIT: 32.3 % — AB (ref 35.0–47.0)
Hemoglobin: 11 g/dL — ABNORMAL LOW (ref 12.0–16.0)
LYMPHS PCT: 41 %
Lymphs Abs: 1.3 10*3/uL (ref 1.0–3.6)
MCH: 31.5 pg (ref 26.0–34.0)
MCHC: 34.1 g/dL (ref 32.0–36.0)
MCV: 92.5 fL (ref 80.0–100.0)
MONO ABS: 0.4 10*3/uL (ref 0.2–0.9)
Monocytes Relative: 12 %
NEUTROS ABS: 1.3 10*3/uL — AB (ref 1.4–6.5)
Neutrophils Relative %: 40 %
Platelets: 145 10*3/uL — ABNORMAL LOW (ref 150–440)
RBC: 3.5 MIL/uL — AB (ref 3.80–5.20)
RDW: 16.5 % — ABNORMAL HIGH (ref 11.5–14.5)
WBC: 3.2 10*3/uL — ABNORMAL LOW (ref 3.6–11.0)

## 2017-05-30 NOTE — Progress Notes (Signed)
Patient here today for follow up.  Patient states no new concerns today  

## 2017-06-01 ENCOUNTER — Telehealth: Payer: Self-pay

## 2017-06-01 NOTE — Telephone Encounter (Signed)
I have spoken with Dr. Theora Gianotti regarding the [place] trail. With Ms. Mcclish interest, her team will contact [name] regarding the trial. Oncology Nurse Navigator Documentation  Navigator Location: [facility] (06/01/17 0800)   )Navigator Encounter Type: Telephone (06/01/17 0800)                                                    Time Spent with Patient: 15 (06/01/17 0800)

## 2017-06-06 ENCOUNTER — Ambulatory Visit

## 2017-07-04 ENCOUNTER — Encounter: Payer: Self-pay | Admitting: Obstetrics and Gynecology

## 2017-07-04 ENCOUNTER — Inpatient Hospital Stay: Payer: Medicare Other

## 2017-07-04 ENCOUNTER — Inpatient Hospital Stay: Payer: Medicare Other | Attending: Obstetrics and Gynecology | Admitting: Obstetrics and Gynecology

## 2017-07-04 VITALS — BP 125/71 | HR 83 | Temp 97.5°F | Resp 18 | Ht 68.0 in | Wt 119.6 lb

## 2017-07-04 DIAGNOSIS — Z9221 Personal history of antineoplastic chemotherapy: Secondary | ICD-10-CM | POA: Diagnosis not present

## 2017-07-04 DIAGNOSIS — Z90722 Acquired absence of ovaries, bilateral: Secondary | ICD-10-CM

## 2017-07-04 DIAGNOSIS — Z8543 Personal history of malignant neoplasm of ovary: Secondary | ICD-10-CM | POA: Diagnosis not present

## 2017-07-04 DIAGNOSIS — Z90710 Acquired absence of both cervix and uterus: Secondary | ICD-10-CM | POA: Insufficient documentation

## 2017-07-04 DIAGNOSIS — Z95828 Presence of other vascular implants and grafts: Secondary | ICD-10-CM

## 2017-07-04 DIAGNOSIS — C569 Malignant neoplasm of unspecified ovary: Secondary | ICD-10-CM

## 2017-07-04 MED ORDER — HEPARIN SOD (PORK) LOCK FLUSH 100 UNIT/ML IV SOLN
500.0000 [IU] | INTRAVENOUS | Status: AC | PRN
  Administered 2017-07-04: 500 [IU]

## 2017-07-04 MED ORDER — SODIUM CHLORIDE 0.9% FLUSH
10.0000 mL | INTRAVENOUS | Status: AC | PRN
  Administered 2017-07-04: 10 mL
  Filled 2017-07-04: qty 10

## 2017-07-04 NOTE — Progress Notes (Signed)
Pt has no gyn concerns. Would like to know when to get port out. I told her she would need to check with Dr. Tasia Catchings. I sent her  inbasket about the pt question

## 2017-07-04 NOTE — Progress Notes (Signed)
Gynecologic Oncology Interval Visit   Referring Provider: Dr. 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 G0P0 female with advanced ovarian cancer s/p 4 cycles of neoadjuvant chemotherapy who presents for follow-up.    On 03/14/2017 she underwent L/S converted to XL, TAH, BSO, right PA node resection, omentectomy, and LOA to no residual disease.   In the interim, she has undergone somatic testing.  She was positive for genetic instability/HRD negative for somatic BRCA 1/2 mutation.  Dr. Tasia Catchings, discussed there is a trial (Athena trial) at [facility] for upfront parp inhibitor as first-line maintenance. Ultimately, she opted out of proceeding with the trial. She saw Dr. Georgianne Fick on 04/27/17 for follow-up. Completed chemotherapy on 05/09/17.   CA-125- 7.4 (05/09/17)  Today, she reports feeling well. She reports mild tenderness at surgical incision, sporadic abdominal bloating. Requesting to have port removed. Denies other specific complaints.     Oncology history Katie Hill is a pleasant. G0P0 female with advanced ovarian cancer. See prior notes for complete details. She presented to the ER 11/13/2016 with symptoms of right lower quadrant pain for 3-4 weeks, associated with nausea, bloating, no change in weight, urinary frequency, and change in bowel habits with more diffucult and stringy BM's.  CT CHEST, ABDOMEN, AND PELVIS WITH CONTRAST IMPRESSION: 1. Large heterogeneous central pelvic mass measures up to 12.6 cm. The lesion generates substantial mass-effect on the uterus, bladder, and pelvic sidewall anatomy. The mass appears to invade the mid sigmoid colon. Etiology of the central pelvic mass is not definitive by CT, but ovarian primary is suspected. The lesion becomes indistinguishable from the posterior uterus on some images and uterine origin is possible, but the uterus does not appear to  be the epicenter of the mass and appears to be more displaced by it. MRI of the pelvis without and with contrast may prove helpful to better delineate the relationship of the uterus to the central pelvic mass although it may not be able to definitively localize the origin. 2. Bulky retroperitoneal and left pelvic sidewall lymphadenopathy. The retroperitoneal lymphadenopathy generates substantial mass-effect on the IVC and left renal vein. The external iliac veins along each pelvic sidewall are markedly attenuated by the mass/lymphadenopathy. Patency of these vessels cannot be definitely confirmed on this exam.  11/16/16 LYMPH NODE, LEFT RETROPERITONEAL; CT-GUIDED CORE BIOPSY:  - METASTATIC HIGH-GRADE SEROUS CARCINOMA.   Decision was made to do neoadjuvant carbo/taxol chemotherapy.  CA125 fell from 4,382 to 64.6 (10/29).  CT scan showed dramatic response.  CT scan 10/14 Vascular/Lymphatic: Normal appearance of the abdominal aorta. Interval decrease in size of previously identified bulky retroperitoneal adenopathy. Index aortocaval node measures 1.6 x 2.6 cm, image 32 of series 2. Previously 4.4 x 5.3 cm. Index left periaortic nodal mass measures 2.2 x 1.3 cm, image 31 of series 2. Previously 3.7 x 4.7 cm. At the level of the bifurcation there is a low-attenuation nodal mass which measure 3.1 x 2.6 cm, image 44 of series 2. Previously 4.3 x 4.8 cm. Left common iliac node measures 1.2 cm, image 53 of series 2. Previously 2.2 cm. Left external iliac node measures 1.6 x 1.1 cm, image 67 of series 2. Previously 4.8 x 2.6 cm. Reproductive: Large mass centered around the uterus measures 9.4 x 5.5 cm, image 67 of series 2. Previously this measured 12.6 x 9.2 Cm  On 03/14/2017 she underwent L/S converted to XL, TAH,  BSO, right PA node resection, omentectomy, and LOA to no residual disease.   Pathology 03/14/2017   DIAGNOSIS:  A. OMENTUM; OMENTECTOMY:  - NO TUMOR SEEN.  - ONE NEGATIVE  LYMPH NODE (0/1).   B. RIGHT FALLOPIAN TUBE AND OVARY; SALPINGO-OOPHORECTOMY:  - SMALL FOCI OF HIGH GRADE SEROUS CARCINOMA INVOLVING THE OVARY.  - MARKED THERAPY RELATED CHANGE.  - NO TUMOR SEEN IN THE FALLOPIAN TUBE.   C. UTERUS, CERVIX, LEFT FALLOPIAN TUBE AND OVARY; HYSTERECTOMY AND LEFT  SALPINGO-OOPHORECTOMY:  - NABOTHIAN CYSTS.  - CYSTIC ATROPHY OF THE ENDOMETRIUM.  - SEROSAL ADHESIONS.  - UNREMARKABLE FALLOPIAN TUBE.  - OVARY SHOWING TREATMENT RELATED CHANGE.   D. 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   Genetic testing negative for 83 genes on [employer]'s Multi-Cancer panel (ALK, APC, ATM, AXIN2, BAP1, BARD1, BLM, BMPR1A, BRCA1, BRCA2, BRIP1, CASR, CDC73, CDH1, CDK4, CDKN1B, CDKN1C, CDKN2A, CEBPA, CHEK2, CTNNA1, DICER1, DIS3L2, EGFR, EPCAM, FH, FLCN, GATA2, GPC3, GREM1, HOXB13, HRAS, KIT, MAX, MEN1, MET, MITF, MLH1, MSH2, MSH3, MSH6, MUTYH, NBN, NF1, NF2, NTHL1, PALB2, PDGFRA, PHOX2B, PMS2, POLD1, POLE, POT1, PRKAR1A, PTCH1, PTEN, RAD50, RAD51C, RAD51D, RB1, RECQL4, RET, RUNX1, SDHA, SDHAF2, SDHB, SDHC, SDHD, SMAD4, SMARCA4, SMARCB1, SMARCE1, STK11, SUFU, TERC, TERT, TMEM127, TP53, TSC1, TSC2, VHL, WRN, WT1). 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 This is still considered a normal result.   05/09/17- [facility] MyChoice was positive for genomic instability/HRD, negative for somatic BRCA1/2 mutation.    She completed 3 cycles of adjuvant carbo-taxol on 06/20/17, for a total of 7 cycles of carbo-taxol chemotherapy.   CA-125 11/16/16- 4382 12/25/16- 699.1 01/15/17- 207.6 02/05/17- 64.6 02/28/17- 36.9 03/28/17- 31.9 05/09/17- 7.4  Problem List: Patient Active Problem List   Diagnosis Date Noted  . Hyponatremia 12/12/2016  . Dehydration 12/08/2016  . Malignant neoplasm of ovary ([place]) 11/20/2016  . Pelvic mass 11/15/2016   Past Medical History: Past Medical History:   Diagnosis Date  . High grade ovarian cancer ([place]) 11/20/2016  . Pelvic mass in female    Past Surgical History: Past Surgical History:  Procedure Laterality Date  . APPENDECTOMY    . PORTA CATH INSERTION N/A 11/27/2016   Procedure: Glori Luis Cath Insertion;  Surgeon: Algernon Huxley, MD;  Location: [facility];  Service: Cardiovascular;  Laterality: N/A;   Past Gynecologic History:  Menarche: 17 Last Menstrual Period: 24 years ago History of Abnormal pap: no Last pap: years ago She does not have regular medical cancer and has not has screening mammogram, colonoscopy, or Pap smears. She has not had an abnormal Pap.   OB History:  OB History  Gravida Para Term Preterm AB Living  0 0 0 0 0 0  SAB TAB Ectopic Multiple Live Births  0 0 0 0 0       Family History: Family History  Problem Relation Age of Onset  . Throat cancer Cousin   . Throat cancer Cousin    Social History: Social History   Socioeconomic History  . Marital status: Married    Spouse name: Not on file  . Number of children: Not on file  . Years of education: Not on file  . Highest education level: Not on file  Social Needs  . Financial resource strain: Not on file  . Food insecurity - worry: Not on file  . Food insecurity - inability: Not on file  . Transportation needs - medical: Not on file  .  Transportation needs - non-medical: Not on file  Occupational History  . Not on file  Tobacco Use  . Smoking status: Never Smoker  . Smokeless tobacco: Never Used  Substance and Sexual Activity  . Alcohol use: Yes    Comment: occasional   . Drug use: No  . Sexual activity: Yes    Birth control/protection: Post-menopausal  Other Topics Concern  . Not on file  Social History Narrative  . Not on file   Allergies: Allergies  Allergen Reactions  . Omeprazole Rash   Current Medications: Current Outpatient Medications  Medication Sig Dispense Refill  . Calcium Carbonate Antacid (TUMS E-X PO) Take  by mouth.    . dexamethasone (DECADRON) 4 MG tablet Take 5 tablets (20 mg total) by mouth daily. Take 20 mg daily for 2 days prior to your chemotherapy and take 18m daily for 2 days after chemotherapy. 20 tablet 2  . hydrocortisone 2.5 % cream Apply topically 2 (two) times daily. 30 g 0  . lidocaine-prilocaine (EMLA) cream Apply 1 application topically as needed. Apply small amount to port site at least 1 hour prior to it being accessed, cover with plastic wrap 30 g 1  . ondansetron (ZOFRAN) 4 MG tablet Take 1 tablet (4 mg total) by mouth daily as needed for nausea or vomiting. 30 tablet 0  . oxyCODONE (OXY IR/ROXICODONE) 5 MG immediate release tablet Take 1 tablet (5 mg total) by mouth every 4 (four) hours as needed for severe pain. 30 tablet 0  . senna (SENOKOT) 8.6 MG TABS tablet Take 2 tablets (17.2 mg total) by mouth daily. Hold if loose stool. 120 each 0  . Tetrahydrozoline HCl (REDNESS RELIEVER EYE DROPS OP) Apply 1 drop to eye daily as needed (red eye).     Current Facility-Administered Medications  Medication Dose Route Frequency Provider Last Rate Last Dose  . ondansetron (ZOFRAN) injection 4 mg  4 mg Intravenous Once DAlgernon Huxley MD       Review of Systems General: no complaints  HEENT: no complaints  Lungs: no complaints  Cardiac: no complaints  GI: no complaints  GU: no complaints  Musculoskeletal: no complaints  Extremities: no complaints  Skin: no complaints  Neuro: no complaints  Endocrine: no complaints  Psych: no complaints     Objective:  Physical Examination:  BP 125/71   Pulse 83   Temp (!) 97.5 F (36.4 C) (Tympanic)   Resp 18   Ht _0  (1.727 m)   Wt 119 lb 9.6 oz (54.3 kg)   BMI 18.19 kg/m   ECOG Performance Status: 1 - Symptomatic but completely ambulatory  General appearance: alert, cooperative and appears stated age HEENT:PERRLA, extra ocular movement intact and sclera clear, anicteric Lymph node survey: non-palpable, axillary, and  supraclavicular, bilateral palpable inguinal nodes but not enlarged. Cardiovascular: regular rate and rhythm Respiratory: normal air entry, lungs clear to auscultation Abdomen: soft, NTND, no masses or ascites; incision healing well and no hernias Back: inspection of back is normal Extremities: extremities normal, atraumatic, no cyanosis or edema Skin exam - normal coloration and turgor, no rashes, no suspicious skin lesions noted. Neurological exam reveals alert, oriented, normal speech, no focal findings or movement disorder noted.  Pelvic: exam chaperoned by NP;  Vulva: normal appearing vulva with no masses, tenderness or lesions; BME and rectal negative for masses or nodularity.   CA-125 11/16/16- 4382 12/25/16- 699.1 01/15/17- 207.6 02/05/17- 64.6 02/28/17- 36.9 03/28/17- 31.9 05/09/17- 7.4   Assessment:  ETyrell Antonio  HILARIE SINHA is a 67 y.o. female diagnosed with high grade serous ovarian cancer on CT directed node biopsy with partial bowel obstruction and extensive bulky adenopathy 8/18. s/p 4 cycles of neoadjuvant carbo/taxol chemotherapy with dramatic decline in CA125 and improvement on CT scan. Debulking surgery to no gross residual on 03/14/2017. Reinitiation of adjuvant chemotherapy on 03/28/2017, completed 3 additional cycles on 06/20/17. CA125 and exam now normal.  She had germline testing with [employer] gene panel and BRCA1/2 and other genes were negative.  [facility] MyChoice testing on the tumor was negative for BRCA 1/2 mutation, but positive for genomic instability/HRD.    Medical co-morbidities complicating care: prior abdominal surgery.  Plan:   Problem List Items Addressed This Visit      Genitourinary   Malignant neoplasm of ovary ([place]) - Primary   Relevant Orders   CBC with Differential   Comprehensive metabolic panel   Magnesium   CA 125     The cancer was positive for HRD and she was contacted by [facility] for consideration of Athena trial for parpi and/or nivolumimab  maintenance therapy, but ultimately opted out due to potential for side effects.   Discussed that she can expect visits every 3-4 months for the first 2 years then every 3-6 months for 3 years, then annually after 5 years. We will continue to monitor her CA-125 levels at each visit. She will see Dr Tasia Catchings in May and [place] in August.     Beckey Rutter, [place], AGNP-C [place] [facility] [phone] (work cell) [phone] (office) 07/04/17 1:59 PM  I personally interviewed and examined the patient. Agreed with the above/below plan of care. Patient/family questions were answered.  Mellody Drown, MD   CC:  [address],  [address] [place], [place] [address]

## 2017-07-05 ENCOUNTER — Telehealth: Payer: Self-pay | Admitting: Nurse Practitioner

## 2017-07-05 LAB — CA 125: Cancer Antigen (CA) 125: 11.3 U/mL (ref 0.0–38.1)

## 2017-07-05 NOTE — Telephone Encounter (Signed)
Called patient with results of CA 125. All questions answered. Keep follow-up appointment as scheduled for further monitoring.

## 2017-08-01 ENCOUNTER — Inpatient Hospital Stay: Payer: Medicare Other | Attending: Oncology

## 2017-08-02 NOTE — Progress Notes (Unsigned)
Survivorship Care Plan visit completed.  Treatment summary reviewed and given to patient.  ASCO answers booklet reviewed and given to patient.  CARE program and Cancer Transitions discussed with patient along with other resources cancer center offers to patients and caregivers.  Patient verbalized understanding.    

## 2017-08-27 ENCOUNTER — Inpatient Hospital Stay (HOSPITAL_BASED_OUTPATIENT_CLINIC_OR_DEPARTMENT_OTHER): Payer: Medicare Other | Admitting: Oncology

## 2017-08-27 ENCOUNTER — Other Ambulatory Visit: Payer: Self-pay

## 2017-08-27 ENCOUNTER — Inpatient Hospital Stay: Payer: Medicare Other | Attending: Oncology

## 2017-08-27 ENCOUNTER — Encounter: Payer: Self-pay | Admitting: Oncology

## 2017-08-27 DIAGNOSIS — E871 Hypo-osmolality and hyponatremia: Secondary | ICD-10-CM

## 2017-08-27 DIAGNOSIS — C569 Malignant neoplasm of unspecified ovary: Secondary | ICD-10-CM

## 2017-08-27 DIAGNOSIS — Z8543 Personal history of malignant neoplasm of ovary: Secondary | ICD-10-CM | POA: Diagnosis not present

## 2017-08-27 DIAGNOSIS — D649 Anemia, unspecified: Secondary | ICD-10-CM | POA: Insufficient documentation

## 2017-08-27 LAB — COMPREHENSIVE METABOLIC PANEL
ALT: 9 U/L — ABNORMAL LOW (ref 14–54)
ANION GAP: 9 (ref 5–15)
AST: 16 U/L (ref 15–41)
Albumin: 4.3 g/dL (ref 3.5–5.0)
Alkaline Phosphatase: 59 U/L (ref 38–126)
BILIRUBIN TOTAL: 0.8 mg/dL (ref 0.3–1.2)
BUN: 10 mg/dL (ref 6–20)
CHLORIDE: 100 mmol/L — AB (ref 101–111)
CO2: 21 mmol/L — ABNORMAL LOW (ref 22–32)
Calcium: 10 mg/dL (ref 8.9–10.3)
Creatinine, Ser: 0.49 mg/dL (ref 0.44–1.00)
Glucose, Bld: 87 mg/dL (ref 65–99)
POTASSIUM: 3.7 mmol/L (ref 3.5–5.1)
Sodium: 130 mmol/L — ABNORMAL LOW (ref 135–145)
TOTAL PROTEIN: 7.4 g/dL (ref 6.5–8.1)

## 2017-08-27 LAB — CBC WITH DIFFERENTIAL/PLATELET
BASOS ABS: 0 10*3/uL (ref 0–0.1)
Basophils Relative: 1 %
EOS PCT: 4 %
Eosinophils Absolute: 0.1 10*3/uL (ref 0–0.7)
HCT: 33.1 % — ABNORMAL LOW (ref 35.0–47.0)
HEMOGLOBIN: 11.8 g/dL — AB (ref 12.0–16.0)
LYMPHS ABS: 1.2 10*3/uL (ref 1.0–3.6)
LYMPHS PCT: 34 %
MCH: 32.5 pg (ref 26.0–34.0)
MCHC: 35.8 g/dL (ref 32.0–36.0)
MCV: 90.9 fL (ref 80.0–100.0)
Monocytes Absolute: 0.3 10*3/uL (ref 0.2–0.9)
Monocytes Relative: 8 %
NEUTROS PCT: 53 %
Neutro Abs: 1.8 10*3/uL (ref 1.4–6.5)
PLATELETS: 168 10*3/uL (ref 150–440)
RBC: 3.64 MIL/uL — AB (ref 3.80–5.20)
RDW: 12.8 % (ref 11.5–14.5)
WBC: 3.5 10*3/uL — ABNORMAL LOW (ref 3.6–11.0)

## 2017-08-27 MED ORDER — HEPARIN SOD (PORK) LOCK FLUSH 100 UNIT/ML IV SOLN
500.0000 [IU] | Freq: Once | INTRAVENOUS | Status: AC
  Administered 2017-08-27: 500 [IU] via INTRAVENOUS

## 2017-08-27 MED ORDER — SODIUM CHLORIDE 0.9% FLUSH
10.0000 mL | Freq: Once | INTRAVENOUS | Status: AC
  Administered 2017-08-27: 10 mL via INTRAVENOUS
  Filled 2017-08-27: qty 10

## 2017-08-27 NOTE — Progress Notes (Signed)
[facility] Follow up visit  Patient Care Team: Patient, [name], [name], [name], [name], MD as Referring Physician (Obstetrics and Gynecology)  CHIEF COMPLAINTS/PURPOSE OF Visit Follow up for Treatment of  ovarian cancer.  HISTORY OF PRESENTING ILLNESS: Katie Hill 67 y.o. female presents for follow up of management of stage IIIC ovarian cancer. She underwent  Neoadjuvant chemotherapy of carbo and taxol x 4  # Patient had debulking surgery on 03/14/2017. She had a laparoscopy with conversion to laparotomy, total hysterectomy, with bilateral salpingo oophorectomy, right aortic lymph node dissection, omentectomy. Pathology showed small foci of residual disease in ovary.   Genetic testing negative for83 genes on [facility]'s Multi-Cancer panel (ALK, APC, ATM, AXIN2, BAP1, BARD1, BLM, BMPR1A, BRCA1, BRCA2, BRIP1, CASR, CDC73, CDH1, CDK4, CDKN1B, CDKN1C, CDKN2A, CEBPA, CHEK2, CTNNA1, DICER1, DIS3L2, EGFR, EPCAM, FH, FLCN, GATA2, GPC3, GREM1, HOXB13, HRAS, KIT, MAX, MEN1, MET, MITF, MLH1, MSH2, MSH3, MSH6, MUTYH, NBN, NF1, NF2, NTHL1, PALB2, PDGFRA, PHOX2B, PMS2, POLD1, POLE, POT1, PRKAR1A, PTCH1, PTEN, RAD50, RAD51C, RAD51D, RB1, RECQL4, RET, RUNX1, SDHA, SDHAF2, SDHB, SDHC, SDHD, SMAD4, SMARCA4, SMARCB1, SMARCE1, STK11, SUFU, TERC, TERT, TMEM127, TP53, TSC1, TSC2, VHL, WRN, WT1).  A Variant of UncertainSignificancewas detected: [id number].106G>A (p.Gly36Arg). Myraid testing negative for somatic BRACA1/2, positive for HRD.   # HRD positive, was referred to [facility] for clinical trials of Olarparib maintenance. She opted out.   Treatment:  #s/p Carboplatin and taxol x 4 neoadjuvant chemotherapy followed by debulking surgery # S/p Adjuvant carbo and taxol x3    INTERVAL  HISTORY Patient presents for follow up of her ovarian cancer. Reports doing well with good energy level.  Review of Systems  Constitutional: Negative for appetite change, chills, fatigue and unexpected weight change.  HENT:   Negative for hearing loss and mouth sores.   Eyes: Negative for eye problems.  Respiratory: Negative for cough.   Cardiovascular: Negative for chest pain and leg swelling.  Gastrointestinal: Negative for abdominal distention and abdominal pain.  Endocrine: Negative for hot flashes.  Genitourinary: Negative for difficulty urinating and dysuria.   Musculoskeletal: Negative for arthralgias, back pain and gait problem.  Skin: Negative for itching.  Neurological: Negative for dizziness, gait problem and speech difficulty.  Hematological: Negative for adenopathy.  Psychiatric/Behavioral: The patient is not nervous/anxious.     MEDICAL HISTORY: Past Medical History:  Diagnosis Date  . Dysrhythmia   . Genetic testing 03/28/2017   Multi-Cancer panel (83 genes) @ [facility] - No pathogenic mutations detected  . High grade ovarian cancer ([place]) 11/20/2016  . Pelvic mass in female     SURGICAL HISTORY: Past Surgical History:  Procedure Laterality Date  . APPENDECTOMY    . LAPAROSCOPY N/A 03/14/2017   Procedure: LAPAROSCOPY OPERATIVE;  Surgeon: Mellody Drown, MD;  Location: [facility];  Service: Gynecology;  Laterality: N/A;  . LAPAROTOMY N/A 03/14/2017   Procedure: LAPAROTOMY;  Surgeon: Mellody Drown, MD;  Location: [facility];  Service: Gynecology;  Laterality: N/A;  . LYMPH NODE DISSECTION N/A 03/14/2017   Procedure: LYMPH NODE DISSECTION;  Surgeon: Mellody Drown, MD;  Location: [facility];  Service: Gynecology;  Laterality: N/A;  . OMENTECTOMY N/A 03/14/2017   Procedure: OMENTECTOMY;  Surgeon: Mellody Drown, MD;  Location: [facility];  Service: Gynecology;  Laterality: N/A;  . PORTA CATH INSERTION N/A 11/27/2016   Procedure:  Porta Cath Insertion;  Surgeon: Algernon Huxley,  MD;  Location: [place] [facility];  Service: Cardiovascular;  Laterality: N/A;    SOCIAL HISTORY: Social History   Socioeconomic History  . Marital status: Married    Spouse name: Not on file  . Number of children: Not on file  . Years of education: Not on file  . Highest education level: Not on file  Occupational History  . Not on file  Social Needs  . Financial resource strain: Not on file  . Food insecurity:    Worry: Not on file    Inability: Not on file  . Transportation needs:    Medical: Not on file    Non-medical: Not on file  Tobacco Use  . Smoking status: Never Smoker  . Smokeless tobacco: Never Used  Substance and Sexual Activity  . Alcohol use: No    Frequency: Never    Comment: use to be occ. but not any since 5 months  . Drug use: No  . Sexual activity: Yes    Birth control/protection: Post-menopausal  Lifestyle  . Physical activity:    Days per week: Not on file    Minutes per session: Not on file  . Stress: Not on file  Relationships  . Social connections:    Talks on phone: Not on file    Gets together: Not on file    Attends religious service: Not on file    Active member of club or organization: Not on file    Attends meetings of clubs or organizations: Not on file    Relationship status: Not on file  . Intimate partner violence:    Fear of current or ex partner: Not on file    Emotionally abused: Not on file    Physically abused: Not on file    Forced sexual activity: Not on file  Other Topics Concern  . Not on file  Social History Narrative  . Not on file    FAMILY HISTORY Family History  Problem Relation Age of Onset  . Throat cancer Cousin   . Throat cancer Cousin   . Leukemia Cousin     ALLERGIES:  is allergic to omeprazole.  MEDICATIONS:  Current Outpatient Medications  Medication Sig Dispense Refill  . calcium carbonate (TUMS - DOSED IN MG ELEMENTAL CALCIUM) 500 MG chewable tablet Chew 1 tablet by mouth as needed for  indigestion or heartburn.    . lidocaine-prilocaine (EMLA) cream Apply 1 application topically as needed. Apply small amount to port site at least 1 hour prior to it being accessed, cover with plastic wrap 30 g 1  . ondansetron (ZOFRAN) 4 MG tablet Take 1 tablet (4 mg total) by mouth daily as needed for nausea or vomiting. 30 tablet 0  . Tetrahydrozoline HCl (REDNESS RELIEVER EYE DROPS OP) Place 1 drop into both eyes as needed (for red eyes).      No current facility-administered medications for this visit.     PHYSICAL EXAMINATION:  ECOG PERFORMANCE STATUS: 1 - Symptomatic but completely ambulatory   Vitals:   08/27/17 1004  BP: 102/66  Pulse: 68  Temp: (!) 97 F (36.1 C)    Filed Weights   08/27/17 1004  Weight: 121 lb 2 oz (54.9 kg)     Physical Exam  Constitutional: She is oriented to person, place, and time and well-developed, well-nourished, and in no distress. No distress.  HENT:  Head: Normocephalic and atraumatic.  Mouth/Throat: No oropharyngeal exudate.  Eyes:  Pupils are equal, round, and reactive to light. EOM are normal. No scleral icterus.  Neck: Normal range of motion. Neck supple. No JVD present.  Cardiovascular:  No murmur heard. Pulmonary/Chest: Effort normal and breath sounds normal. No respiratory distress.  Abdominal: Soft. Bowel sounds are normal. She exhibits no distension. There is no tenderness. There is no rebound.  Incision is well healed.  Musculoskeletal: Normal range of motion. She exhibits no edema.  Lymphadenopathy:    She has no cervical adenopathy.  Neurological: She is alert and oriented to person, place, and time.  Skin: Skin is warm and dry. No erythema.  Psychiatric: Affect and judgment normal.       SKIN: LABORATORY DATA: I have personally reviewed the data as listed: CBC    Component Value Date/Time   WBC 3.5 (L) 08/27/2017 0945   RBC 3.64 (L) 08/27/2017 0945   HGB 11.8 (L) 08/27/2017 0945   HCT 33.1 (L) 08/27/2017 0945    PLT 168 08/27/2017 0945   MCV 90.9 08/27/2017 0945   MCH 32.5 08/27/2017 0945   MCHC 35.8 08/27/2017 0945   RDW 12.8 08/27/2017 0945   LYMPHSABS 1.2 08/27/2017 0945   MONOABS 0.3 08/27/2017 0945   EOSABS 0.1 08/27/2017 0945   BASOSABS 0.0 08/27/2017 0945   CMP Latest Ref Rng & Units 08/27/2017 05/30/2017 05/09/2017  Glucose 65 - 99 mg/dL 87 113(H) 88  BUN 6 - 20 mg/dL '10 9 18  ' Creatinine 0.44 - 1.00 mg/dL 0.49 0.58 0.63  Sodium 135 - 145 mmol/L 130(L) 133(L) 132(L)  Potassium 3.5 - 5.1 mmol/L 3.7 3.8 3.6  Chloride 101 - 111 mmol/L 100(L) 103 101  CO2 22 - 32 mmol/L 21(L) 24 21(L)  Calcium 8.9 - 10.3 mg/dL 10.0 9.8 9.8  Total Protein 6.5 - 8.1 g/dL 7.4 7.0 6.9  Total Bilirubin 0.3 - 1.2 mg/dL 0.8 0.5 0.3  Alkaline Phos 38 - 126 U/L 59 62 55  AST 15 - 41 U/L '16 17 17  ' ALT 14 - 54 U/L 9(L) 12(L) 11(L)    pathology 11/16/2016 Surgical Pathology  CASE: [id number]  PATIENT: Doriana Leavitt  Surgical Pathology Report   SPECIMEN SUBMITTED:  A. Retroperitoneal adenopathy, left  DIAGNOSIS:  A. LYMPH NODE, LEFT RETROPERITONEAL; CT-GUIDED CORE BIOPSY:  - METASTATIC HIGH-GRADE SEROUS CARCINOMA.   Comment:   RADIOGRAPHIC STUDIES: I have personally reviewed the radiological images as listed and agree with the findings in the report CT chest abdomen pelvis with contrast 11/13/2016 IMPRESSION: 1. Large heterogeneous central pelvic mass measures up to 12.6 cm. The lesion generates substantial mass-effect on the uterus, bladder, and pelvic sidewall anatomy. The mass appears to invade the mid sigmoid colon. Etiology of the central pelvic mass is not definitive by CT, but ovarian primary is suspected. The lesion becomes indistinguishable from the posterior uterus on some images and uterine origin is possible, but the uterus does not appear to be the epicenter of the mass and appears to be more displaced by it. MRI of the pelvis without and with contrast may prove helpful to better delineate  the relationship of the uterus to the central pelvic mass although it may not be able to definitively localize the origin. 2. Bulky retroperitoneal and left pelvic sidewall lymphadenopathy. The retroperitoneal lymphadenopathy generates substantial mass-effect on the IVC and left renal vein. The external iliac veins along each pelvic sidewall are markedly attenuated by the mass/lymphadenopathy. Patency of these vessels cannot be definitely confirmed on this exam.  Pathology 03/14/2017   DIAGNOSIS:  A. OMENTUM; OMENTECTOMY:  - NO TUMOR SEEN.  - ONE NEGATIVE LYMPH NODE (0/1).   B. RIGHT FALLOPIAN TUBE AND OVARY; SALPINGO-OOPHORECTOMY:  - SMALL FOCI OF HIGH GRADE SEROUS CARCINOMA INVOLVING THE OVARY.  - MARKED THERAPY RELATED CHANGE.  - NO TUMOR SEEN IN THE FALLOPIAN TUBE.   C. UTERUS, CERVIX, LEFT FALLOPIAN TUBE AND OVARY; HYSTERECTOMY AND LEFT  SALPINGO-OOPHORECTOMY:  - NABOTHIAN CYSTS.  - CYSTIC ATROPHY OF THE ENDOMETRIUM.  - SEROSAL ADHESIONS.  - UNREMARKABLE FALLOPIAN TUBE.  - OVARY SHOWING TREATMENT RELATED CHANGE.   D. 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    ASSESSMENT/PLAN Cancer Staging Malignant neoplasm of ovary [facility]) Staging form: Ovary, Fallopian Tube, and Primary Peritoneal Carcinoma, AJCC 8th Edition - Clinical stage from 11/22/2016: Stage IIIC (cT3c, cN1b, cM0) - Signed by Earlie Server, MD on 11/22/2016  1. Malignant neoplasm of ovary, unspecified laterality ([place])   2. High grade ovarian cancer ([place])   3. Normocytic anemia     # Ovarian Cancer, in remission.  Will check CA 125 every 3 months.   # Anemia: improved.   # Hyponatremia: chronic, stable.  # All questions were answered. The patient knows to call the clinic with any problems, questions or concerns. Follow up in 3 weeks. Lab, MD and Carbo Taxol.   Earlie Server, MD, PhD Hematology Oncology [place] at  [facility] Pager- [phone] 08/27/2017 [place] [facility] Follow up visit  Patient Care Team: Patient, [name], [name], [name], [name], MD as Referring Physician (Obstetrics and Gynecology)  CHIEF COMPLAINTS/PURPOSE OF Visit Follow up for Treatment of  ovarian cancer.  HISTORY OF PRESENTING ILLNESS: Katie Hill 67 y.o. female presents for follow up of management of ovarian cancer. She is on Neoadjuvant chemotherapy.   On presentation, CT imaging revealed a large pelvic mass and extensive adenopathy. 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 Patient reports healthy at baseline and has not follow-up with any doctor for her life. She is married, G0P0 She denies any fatigue, weight loss, chest pain, cough, shortness of breath, and lower extremity swelling. She noted some urinary symptoms with frequency of urination. She also mentioned that she intermittently pass some mucus in her stool.also some back pain which is new.  Treatment:  #Carboplatin and taxol x 4 # Patient had debulking surgery on 03/14/2017. She had a laparoscopy with conversion to laparotomy, total hysterectomy, with bilateral salpingo oophorectomy, right aortic lymph node dissection, omentectomy. Pathology showed small foci of residual disease in ovary.   INTERVAL HISTORY Patient presents for evaluation prior to post surgery and adjuvant chemotherapy,   Today she reports feeling well. She has taken her steroid premeds. Denies any rash, chest pain, SOB, abdominal pain.  She has a good appetite. Fatigue is better   Review of Systems  Constitutional: Negative for appetite change, chills and fatigue.  HENT:   Negative for hearing loss and mouth  sores.   Eyes: Negative for eye problems.  Respiratory: Negative for chest tightness and cough.   Cardiovascular: Negative for chest pain and leg swelling.  Gastrointestinal: Negative for abdominal distention and abdominal pain.  Endocrine: Negative for hot flashes.  Genitourinary: Negative for difficulty urinating and dysuria.   Musculoskeletal: Negative  for arthralgias, back pain and gait problem.  Skin: Negative for itching.  Neurological: Negative for dizziness, gait problem and speech difficulty.  Hematological: Negative for adenopathy.  Psychiatric/Behavioral: The patient is not nervous/anxious.     MEDICAL HISTORY: Past Medical History:  Diagnosis Date  . Dysrhythmia   . Genetic testing 03/28/2017   Multi-Cancer panel (83 genes) @ [facility] - No pathogenic mutations detected  . High grade ovarian cancer ([place]) 11/20/2016  . Pelvic mass in female     SURGICAL HISTORY: Past Surgical History:  Procedure Laterality Date  . APPENDECTOMY    . LAPAROSCOPY N/A 03/14/2017   Procedure: LAPAROSCOPY OPERATIVE;  Surgeon: Mellody Drown, MD;  Location: [facility];  Service: Gynecology;  Laterality: N/A;  . LAPAROTOMY N/A 03/14/2017   Procedure: LAPAROTOMY;  Surgeon: Mellody Drown, MD;  Location: [facility];  Service: Gynecology;  Laterality: N/A;  . LYMPH NODE DISSECTION N/A 03/14/2017   Procedure: LYMPH NODE DISSECTION;  Surgeon: Mellody Drown, MD;  Location: [facility];  Service: Gynecology;  Laterality: N/A;  . OMENTECTOMY N/A 03/14/2017   Procedure: OMENTECTOMY;  Surgeon: Mellody Drown, MD;  Location: [facility];  Service: Gynecology;  Laterality: N/A;  . PORTA CATH INSERTION N/A 11/27/2016   Procedure: Glori Luis Cath Insertion;  Surgeon: Algernon Huxley, MD;  Location: [place] [facility];  Service: Cardiovascular;  Laterality: N/A;    SOCIAL HISTORY: Social History   Socioeconomic History  . Marital status: Married    Spouse name: Not on file  . Number of children: Not on file  .  Years of education: Not on file  . Highest education level: Not on file  Occupational History  . Not on file  Social Needs  . Financial resource strain: Not on file  . Food insecurity:    Worry: Not on file    Inability: Not on file  . Transportation needs:    Medical: Not on file    Non-medical: Not on file  Tobacco Use  . Smoking status: Never Smoker  . Smokeless tobacco: Never Used  Substance and Sexual Activity  . Alcohol use: No    Frequency: Never    Comment: use to be occ. but not any since 5 months  . Drug use: No  . Sexual activity: Yes    Birth control/protection: Post-menopausal  Lifestyle  . Physical activity:    Days per week: Not on file    Minutes per session: Not on file  . Stress: Not on file  Relationships  . Social connections:    Talks on phone: Not on file    Gets together: Not on file    Attends religious service: Not on file    Active member of club or organization: Not on file    Attends meetings of clubs or organizations: Not on file    Relationship status: Not on file  . Intimate partner violence:    Fear of current or ex partner: Not on file    Emotionally abused: Not on file    Physically abused: Not on file    Forced sexual activity: Not on file  Other Topics Concern  . Not on file  Social History Narrative  . Not on file    FAMILY HISTORY Family History  Problem Relation Age of Onset  . Throat cancer Cousin   . Throat cancer Cousin   . Leukemia Cousin     ALLERGIES:  is allergic to omeprazole.  MEDICATIONS:  Current Outpatient Medications  Medication Sig Dispense Refill  .  calcium carbonate (TUMS - DOSED IN MG ELEMENTAL CALCIUM) 500 MG chewable tablet Chew 1 tablet by mouth as needed for indigestion or heartburn.    . lidocaine-prilocaine (EMLA) cream Apply 1 application topically as needed. Apply small amount to port site at least 1 hour prior to it being accessed, cover with plastic wrap 30 g 1  . ondansetron (ZOFRAN) 4 MG  tablet Take 1 tablet (4 mg total) by mouth daily as needed for nausea or vomiting. 30 tablet 0  . Tetrahydrozoline HCl (REDNESS RELIEVER EYE DROPS OP) Place 1 drop into both eyes as needed (for red eyes).      No current facility-administered medications for this visit.     PHYSICAL EXAMINATION:  ECOG PERFORMANCE STATUS: 0 - Asymptomatic   Vitals:   08/27/17 1004  BP: 102/66  Pulse: 68  Temp: (!) 97 F (36.1 C)    Filed Weights   08/27/17 1004  Weight: 121 lb 2 oz (54.9 kg)   GENERAL: No distress, well nourished.  SKIN:  No rashes or significant lesions  HEAD: Normocephalic, No masses, lesions, tenderness or abnormalities  EYES: Conjunctiva are pink, non icteric ENT: External ears normal ,lips , buccal mucosa, and tongue normal and mucous membranes are moist  LYMPH: No palpable cervical and axillary lymphadenopathy  LUNGS: Clear to auscultation, no crackles or wheezes HEART: Regular rate & rhythm, no murmurs, no gallops, S1 normal and S2 normal  ABDOMEN: Abdomen soft, non-tender, normal bowel sounds, I did not appreciate any  masses or organomegaly. Incision is well healed.  MUSCULOSKELETAL: No CVA tenderness and no tenderness on percussion of the back or rib cage.  EXTREMITIES: No edema, no skin discoloration or tenderness NEURO: Alert & oriented, no focal motor/sensory deficits.    LABORATORY DATA: I have personally reviewed the data as listed: CBC    Component Value Date/Time   WBC 3.5 (L) 08/27/2017 0945   RBC 3.64 (L) 08/27/2017 0945   HGB 11.8 (L) 08/27/2017 0945   HCT 33.1 (L) 08/27/2017 0945   PLT 168 08/27/2017 0945   MCV 90.9 08/27/2017 0945   MCH 32.5 08/27/2017 0945   MCHC 35.8 08/27/2017 0945   RDW 12.8 08/27/2017 0945   LYMPHSABS 1.2 08/27/2017 0945   MONOABS 0.3 08/27/2017 0945   EOSABS 0.1 08/27/2017 0945   BASOSABS 0.0 08/27/2017 0945   CMP Latest Ref Rng & Units 08/27/2017 05/30/2017 05/09/2017  Glucose 65 - 99 mg/dL 87 113(H) 88  BUN 6 - 20  mg/dL '10 9 18  ' Creatinine 0.44 - 1.00 mg/dL 0.49 0.58 0.63  Sodium 135 - 145 mmol/L 130(L) 133(L) 132(L)  Potassium 3.5 - 5.1 mmol/L 3.7 3.8 3.6  Chloride 101 - 111 mmol/L 100(L) 103 101  CO2 22 - 32 mmol/L 21(L) 24 21(L)  Calcium 8.9 - 10.3 mg/dL 10.0 9.8 9.8  Total Protein 6.5 - 8.1 g/dL 7.4 7.0 6.9  Total Bilirubin 0.3 - 1.2 mg/dL 0.8 0.5 0.3  Alkaline Phos 38 - 126 U/L 59 62 55  AST 15 - 41 U/L '16 17 17  ' ALT 14 - 54 U/L 9(L) 12(L) 11(L)    pathology 11/16/2016 Surgical Pathology  CASE: [id number]  PATIENT: Shawntelle Chuang  Surgical Pathology Report   SPECIMEN SUBMITTED:  A. Retroperitoneal adenopathy, left  DIAGNOSIS:  A. LYMPH NODE, LEFT RETROPERITONEAL; CT-GUIDED CORE BIOPSY:  - METASTATIC HIGH-GRADE SEROUS CARCINOMA.   Comment:   RADIOGRAPHIC STUDIES: I have personally reviewed the radiological images as listed and agree with the findings in the  report CT chest abdomen pelvis with contrast 11/13/2016 IMPRESSION: 1. Large heterogeneous central pelvic mass measures up to 12.6 cm. The lesion generates substantial mass-effect on the uterus, bladder, and pelvic sidewall anatomy. The mass appears to invade the mid sigmoid colon. Etiology of the central pelvic mass is not definitive by CT, but ovarian primary is suspected. The lesion becomes indistinguishable from the posterior uterus on some images and uterine origin is possible, but the uterus does not appear to be the epicenter of the mass and appears to be more displaced by it. MRI of the pelvis without and with contrast may prove helpful to better delineate the relationship of the uterus to the central pelvic mass although it may not be able to definitively localize the origin. 2. Bulky retroperitoneal and left pelvic sidewall lymphadenopathy. The retroperitoneal lymphadenopathy generates substantial mass-effect on the IVC and left renal vein. The external iliac veins along each pelvic sidewall are markedly attenuated by  the mass/lymphadenopathy. Patency of these vessels cannot be definitely confirmed on this exam.  Pathology 03/14/2017   DIAGNOSIS:  A. OMENTUM; OMENTECTOMY:  - NO TUMOR SEEN.  - ONE NEGATIVE LYMPH NODE (0/1).   B. RIGHT FALLOPIAN TUBE AND OVARY; SALPINGO-OOPHORECTOMY:  - SMALL FOCI OF HIGH GRADE SEROUS CARCINOMA INVOLVING THE OVARY.  - MARKED THERAPY RELATED CHANGE.  - NO TUMOR SEEN IN THE FALLOPIAN TUBE.   C. UTERUS, CERVIX, LEFT FALLOPIAN TUBE AND OVARY; HYSTERECTOMY AND LEFT  SALPINGO-OOPHORECTOMY:  - NABOTHIAN CYSTS.  - CYSTIC ATROPHY OF THE ENDOMETRIUM.  - SEROSAL ADHESIONS.  - UNREMARKABLE FALLOPIAN TUBE.  - OVARY SHOWING TREATMENT RELATED CHANGE.   D. 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     ASSESSMENT/PLAN Cancer Staging Malignant neoplasm of ovary [facility]) Staging form: Ovary, Fallopian Tube, and Primary Peritoneal Carcinoma, AJCC 8th Edition - Clinical stage from 11/22/2016: Stage IIIC (cT3c, cN1b, cM0) - Signed by Earlie Server, MD on 11/22/2016  1. Malignant neoplasm of ovary, unspecified laterality ([place])   2. High grade ovarian cancer ([place])   3. Normocytic anemia   3 Homologous recombination deficiency  [facility]riad testing results were discussed with patient. She has HRD, negative for somatic or genetic BRCA mutation, upfront olarparib is not approved in HRD setting.   # Port flush every 6-8weeks, will set up port flush appointment for this year.  Follow-up on 3 months with repeat labs/ tumor marker.    All questions were answered. The patient knows to call the clinic with any problems, questions or concerns. Earlie Server, MD, PhD Hematology Oncology [facility] at [facility] Pager- [phone] 08/27/2017

## 2017-08-28 LAB — CA 125: Cancer Antigen (CA) 125: 14.1 U/mL (ref 0.0–38.1)

## 2017-08-30 ENCOUNTER — Telehealth: Payer: Self-pay | Admitting: *Deleted

## 2017-08-30 NOTE — Telephone Encounter (Signed)
Notified pt of CA125 results

## 2017-08-30 NOTE — Telephone Encounter (Signed)
Patient called asking for results from last weeks labs  Dx:  Normocytic anemia; High grade ovarian...   Ref Range & Units 3d ago  WBC 3.6 - 11.0 K/uL 3.5Low    RBC 3.80 - 5.20 MIL/uL 3.64Low    Hemoglobin 12.0 - 16.0 g/dL 11.8Low    HCT 35.0 - 47.0 % 33.1Low    MCV 80.0 - 100.0 fL 90.9   MCH 26.0 - 34.0 pg 32.5   MCHC 32.0 - 36.0 g/dL 35.8   RDW 11.5 - 14.5 % 12.8   Platelets 150 - 440 K/uL 168   Neutrophils Relative % % 53   Neutro Abs 1.4 - 6.5 K/uL 1.8   Lymphocytes Relative % 34   Lymphs Abs 1.0 - 3.6 K/uL 1.2   Monocytes Relative % 8   Monocytes Absolute 0.2 - 0.9 K/uL 0.3   Eosinophils Relative % 4   Eosinophils Absolute 0 - 0.7 K/uL 0.1   Basophils Relative % 1   Basophils Absolute 0 - 0.1 K/uL 0.0   Comment: Performed at [facility], [address] [place], [place] [address]  Resulting Agency  [facility]      Specimen Collected: 08/27/17 09:45  Last Resulted: 08/27/17 09:51       Dx:  Malignant neoplasm of ovary, unspecif...   Ref Range & Units 3d ago  Cancer Antigen (CA) 125 0.0 - 38.1 U/mL 14.1        Dx:  Normocytic anemia; High grade ovarian...   Ref Range & Units 3d ago  Sodium 135 - 145 mmol/L 130Low    Potassium 3.5 - 5.1 mmol/L 3.7   Chloride 101 - 111 mmol/L 100Low    CO2 22 - 32 mmol/L 21Low    Glucose, Bld 65 - 99 mg/dL 87   BUN 6 - 20 mg/dL 10   Creatinine, Ser 0.44 - 1.00 mg/dL 0.49   Calcium 8.9 - 10.3 mg/dL 10.0   Total Protein 6.5 - 8.1 g/dL 7.4   Albumin 3.5 - 5.0 g/dL 4.3   AST 15 - 41 U/L 16   ALT 14 - 54 U/L 9Low    Alkaline Phosphatase 38 - 126 U/L 59   Total Bilirubin 0.3 - 1.2 mg/dL 0.8   GFR calc non Af Amer >60 mL/min >60   GFR calc Af Amer >60 mL/min >60   Comment: (NOTE)  The eGFR has been calculated using the CKD EPI equation.  This calculation has not been validated in all clinical situations.  eGFR's persistently <60 mL/min signify possible Chronic Kidney  Disease.   Anion gap 5 - 15 9   Comment: Performed  at [facility], [address] [place], [place] [address]  Resulting Agency  [facility]      Specimen Collected: 08/27/17 09:45  Last Resulted: 08/27/17 10:01

## 2017-10-08 ENCOUNTER — Inpatient Hospital Stay: Payer: Medicare Other | Attending: Oncology

## 2017-10-08 DIAGNOSIS — Z452 Encounter for adjustment and management of vascular access device: Secondary | ICD-10-CM | POA: Insufficient documentation

## 2017-10-08 DIAGNOSIS — Z8543 Personal history of malignant neoplasm of ovary: Secondary | ICD-10-CM | POA: Insufficient documentation

## 2017-10-08 DIAGNOSIS — Z95828 Presence of other vascular implants and grafts: Secondary | ICD-10-CM

## 2017-10-08 MED ORDER — SODIUM CHLORIDE 0.9% FLUSH
10.0000 mL | Freq: Once | INTRAVENOUS | Status: AC
  Administered 2017-10-08: 10 mL via INTRAVENOUS
  Filled 2017-10-08: qty 10

## 2017-10-08 MED ORDER — HEPARIN SOD (PORK) LOCK FLUSH 100 UNIT/ML IV SOLN
500.0000 [IU] | Freq: Once | INTRAVENOUS | Status: AC
  Administered 2017-10-08: 500 [IU] via INTRAVENOUS

## 2017-11-09 ENCOUNTER — Inpatient Hospital Stay: Payer: Medicare Other | Attending: Oncology

## 2017-11-09 DIAGNOSIS — Z452 Encounter for adjustment and management of vascular access device: Secondary | ICD-10-CM | POA: Insufficient documentation

## 2017-11-09 DIAGNOSIS — Z90710 Acquired absence of both cervix and uterus: Secondary | ICD-10-CM | POA: Diagnosis not present

## 2017-11-09 DIAGNOSIS — Z90722 Acquired absence of ovaries, bilateral: Secondary | ICD-10-CM | POA: Insufficient documentation

## 2017-11-09 DIAGNOSIS — D649 Anemia, unspecified: Secondary | ICD-10-CM | POA: Diagnosis not present

## 2017-11-09 DIAGNOSIS — C569 Malignant neoplasm of unspecified ovary: Secondary | ICD-10-CM | POA: Insufficient documentation

## 2017-11-09 DIAGNOSIS — Z9221 Personal history of antineoplastic chemotherapy: Secondary | ICD-10-CM | POA: Diagnosis not present

## 2017-11-09 LAB — CBC WITH DIFFERENTIAL/PLATELET
Basophils Absolute: 0 10*3/uL (ref 0–0.1)
Basophils Relative: 1 %
Eosinophils Absolute: 0.1 10*3/uL (ref 0–0.7)
Eosinophils Relative: 3 %
HEMATOCRIT: 34.3 % — AB (ref 35.0–47.0)
HEMOGLOBIN: 11.6 g/dL — AB (ref 12.0–16.0)
LYMPHS PCT: 32 %
Lymphs Abs: 1 10*3/uL (ref 1.0–3.6)
MCH: 30.9 pg (ref 26.0–34.0)
MCHC: 33.7 g/dL (ref 32.0–36.0)
MCV: 91.7 fL (ref 80.0–100.0)
Monocytes Absolute: 0.3 10*3/uL (ref 0.2–0.9)
Monocytes Relative: 9 %
NEUTROS PCT: 55 %
Neutro Abs: 1.8 10*3/uL (ref 1.4–6.5)
Platelets: 167 10*3/uL (ref 150–440)
RBC: 3.73 MIL/uL — AB (ref 3.80–5.20)
RDW: 14 % (ref 11.5–14.5)
WBC: 3.3 10*3/uL — AB (ref 3.6–11.0)

## 2017-11-09 LAB — COMPREHENSIVE METABOLIC PANEL
ALT: 9 U/L (ref 0–44)
AST: 16 U/L (ref 15–41)
Albumin: 4.4 g/dL (ref 3.5–5.0)
Alkaline Phosphatase: 52 U/L (ref 38–126)
Anion gap: 8 (ref 5–15)
BUN: 10 mg/dL (ref 8–23)
CHLORIDE: 103 mmol/L (ref 98–111)
CO2: 23 mmol/L (ref 22–32)
Calcium: 10.1 mg/dL (ref 8.9–10.3)
Creatinine, Ser: 0.54 mg/dL (ref 0.44–1.00)
GFR calc Af Amer: >60 mL/min (ref >60)
Glucose, Bld: 88 mg/dL (ref 70–99)
POTASSIUM: 4.5 mmol/L (ref 3.5–5.1)
Sodium: 134 mmol/L — ABNORMAL LOW (ref 135–145)
Total Bilirubin: 0.9 mg/dL (ref 0.3–1.2)
Total Protein: 7.5 g/dL (ref 6.5–8.1)

## 2017-11-10 LAB — CA 125: CANCER ANTIGEN (CA) 125: 21 U/mL (ref 0.0–38.1)

## 2017-11-14 ENCOUNTER — Other Ambulatory Visit: Payer: Self-pay

## 2017-11-14 ENCOUNTER — Encounter: Payer: Self-pay | Admitting: Oncology

## 2017-11-14 ENCOUNTER — Inpatient Hospital Stay (HOSPITAL_BASED_OUTPATIENT_CLINIC_OR_DEPARTMENT_OTHER): Payer: Medicare Other | Admitting: Oncology

## 2017-11-14 ENCOUNTER — Inpatient Hospital Stay (HOSPITAL_BASED_OUTPATIENT_CLINIC_OR_DEPARTMENT_OTHER): Payer: Medicare Other | Admitting: Obstetrics and Gynecology

## 2017-11-14 ENCOUNTER — Other Ambulatory Visit

## 2017-11-14 VITALS — BP 114/66 | HR 70 | Temp 97.9°F | Resp 16 | Wt 123.4 lb

## 2017-11-14 DIAGNOSIS — Z90710 Acquired absence of both cervix and uterus: Secondary | ICD-10-CM

## 2017-11-14 DIAGNOSIS — D649 Anemia, unspecified: Secondary | ICD-10-CM

## 2017-11-14 DIAGNOSIS — Z90722 Acquired absence of ovaries, bilateral: Secondary | ICD-10-CM | POA: Diagnosis not present

## 2017-11-14 DIAGNOSIS — Z9221 Personal history of antineoplastic chemotherapy: Secondary | ICD-10-CM | POA: Diagnosis not present

## 2017-11-14 DIAGNOSIS — C569 Malignant neoplasm of unspecified ovary: Secondary | ICD-10-CM | POA: Diagnosis not present

## 2017-11-14 DIAGNOSIS — Z452 Encounter for adjustment and management of vascular access device: Secondary | ICD-10-CM | POA: Diagnosis not present

## 2017-11-14 DIAGNOSIS — Z95828 Presence of other vascular implants and grafts: Secondary | ICD-10-CM

## 2017-11-14 NOTE — Progress Notes (Signed)
[place] [facility] Follow up visit  Patient Care Team: Patient, [name], [name], [name], [name], MD as Referring Physician (Obstetrics and Gynecology)  CHIEF COMPLAINTS/PURPOSE OF Visit Follow up for Treatment of  ovarian cancer.  HISTORY OF PRESENTING ILLNESS: Katie Hill 67 y.o. female presents for follow up of management of stage IIIC ovarian cancer. She underwent  Neoadjuvant chemotherapy of carbo and taxol x 4  # Patient had debulking surgery on 03/14/2017. She had a laparoscopy with conversion to laparotomy, total hysterectomy, with bilateral salpingo oophorectomy, right aortic lymph node dissection, omentectomy. Pathology showed small foci of residual disease in ovary.   Genetic testing negative for83 genes on [facility]'s Multi-Cancer panel (ALK, APC, ATM, AXIN2, BAP1, BARD1, BLM, BMPR1A, BRCA1, BRCA2, BRIP1, CASR, CDC73, CDH1, CDK4, CDKN1B, CDKN1C, CDKN2A, CEBPA, CHEK2, CTNNA1, DICER1, DIS3L2, EGFR, EPCAM, FH, FLCN, GATA2, GPC3, GREM1, HOXB13, HRAS, KIT, MAX, MEN1, MET, MITF, MLH1, MSH2, MSH3, MSH6, MUTYH, NBN, NF1, NF2, NTHL1, PALB2, PDGFRA, PHOX2B, PMS2, POLD1, POLE, POT1, PRKAR1A, PTCH1, PTEN, RAD50, RAD51C, RAD51D, RB1, RECQL4, RET, RUNX1, SDHA, SDHAF2, SDHB, SDHC, SDHD, SMAD4, SMARCA4, SMARCB1, SMARCE1, STK11, SUFU, TERC, TERT, TMEM127, TP53, TSC1, TSC2, VHL, WRN, WT1).  A Variant of UncertainSignificancewas detected: CASRc.106G>A (p.Gly36Arg). Myraid testing negative for somatic BRACA1/2, positive for HRD.   # HRD positive, was referred to [facility] for clinical trials of Olarparib maintenance. She opted out.   Treatment:  #s/p Carboplatin and taxol x 4 neoadjuvant chemotherapy followed by debulking surgery # S/p Adjuvant carbo and taxol x3    INTERVAL  HISTORY Patient with above oncology history presents for follow-up of stage IIIc ovarian cancer. She reports doing really well.  Hair has grown back.  She has formed bowel movement 2 times a day. Denies any abdominal pain, bloating, cough, chest pain. Weight has been stable.  Appetite is good.  Review of Systems  Constitutional: Negative for appetite change, chills, fatigue and unexpected weight change.  HENT:   Negative for hearing loss and mouth sores.   Eyes: Negative for eye problems.  Respiratory: Negative for cough.   Cardiovascular: Negative for chest pain and leg swelling.  Gastrointestinal: Negative for abdominal distention and abdominal pain.  Endocrine: Negative for hot flashes.  Genitourinary: Negative for difficulty urinating and dysuria.   Musculoskeletal: Negative for arthralgias, back pain and gait problem.  Skin: Negative for itching.  Neurological: Negative for dizziness, gait problem and speech difficulty.  Hematological: Negative for adenopathy.  Psychiatric/Behavioral: The patient is not nervous/anxious.     MEDICAL HISTORY: Past Medical History:  Diagnosis Date  . Dysrhythmia   . Genetic testing 03/28/2017   Multi-Cancer panel (83 genes) @ [facility] - No pathogenic mutations detected  . High grade ovarian cancer ([place]) 11/20/2016  . Pelvic mass in female     SURGICAL HISTORY: Past Surgical History:  Procedure Laterality Date  . APPENDECTOMY    . LAPAROSCOPY N/A 03/14/2017   Procedure: LAPAROSCOPY OPERATIVE;  Surgeon: Mellody Drown, MD;  Location: [facility] ORS;  Service: Gynecology;  Laterality: N/A;  . LAPAROTOMY N/A 03/14/2017   Procedure: LAPAROTOMY;  Surgeon: Mellody Drown, MD;  Location: [facility] ORS;  Service: Gynecology;  Laterality: N/A;  . LYMPH NODE DISSECTION N/A 03/14/2017   Procedure: LYMPH NODE DISSECTION;  Surgeon: Mellody Drown, MD;  Location: [facility] ORS;  Service: Gynecology;  Laterality: N/A;  .  OMENTECTOMY N/A 03/14/2017   Procedure:  OMENTECTOMY;  Surgeon: Mellody Drown, MD;  Location: [facility] ORS;  Service: Gynecology;  Laterality: N/A;  . PORTA CATH INSERTION N/A 11/27/2016   Procedure: Glori Luis Cath Insertion;  Surgeon: Algernon Huxley, MD;  Location: [place] [facility];  Service: Cardiovascular;  Laterality: N/A;    SOCIAL HISTORY: Social History   Socioeconomic History  . Marital status: Married    Spouse name: Not on file  . Number of children: Not on file  . Years of education: Not on file  . Highest education level: Not on file  Occupational History  . Not on file  Social Needs  . Financial resource strain: Not on file  . Food insecurity:    Worry: Not on file    Inability: Not on file  . Transportation needs:    Medical: Not on file    Non-medical: Not on file  Tobacco Use  . Smoking status: Never Smoker  . Smokeless tobacco: Never Used  Substance and Sexual Activity  . Alcohol use: No    Frequency: Never    Comment: use to be occ. but not any since 5 months  . Drug use: No  . Sexual activity: Yes    Birth control/protection: Post-menopausal  Lifestyle  . Physical activity:    Days per week: Not on file    Minutes per session: Not on file  . Stress: Not on file  Relationships  . Social connections:    Talks on phone: Not on file    Gets together: Not on file    Attends religious service: Not on file    Active member of club or organization: Not on file    Attends meetings of clubs or organizations: Not on file    Relationship status: Not on file  . Intimate partner violence:    Fear of current or ex partner: Not on file    Emotionally abused: Not on file    Physically abused: Not on file    Forced sexual activity: Not on file  Other Topics Concern  . Not on file  Social History Narrative  . Not on file    FAMILY HISTORY Family History  Problem Relation Age of Onset  . Throat cancer Cousin   . Throat cancer Cousin   . Leukemia Cousin     ALLERGIES:  is allergic to  omeprazole.  MEDICATIONS:  Current Outpatient Medications  Medication Sig Dispense Refill  . calcium carbonate (TUMS - DOSED IN MG ELEMENTAL CALCIUM) 500 MG chewable tablet Chew 1 tablet by mouth as needed for indigestion or heartburn.    . lidocaine-prilocaine (EMLA) cream Apply 1 application topically as needed. Apply small amount to port site at least 1 hour prior to it being accessed, cover with plastic wrap 30 g 1  . ondansetron (ZOFRAN) 4 MG tablet Take 1 tablet (4 mg total) by mouth daily as needed for nausea or vomiting. 30 tablet 0  . Tetrahydrozoline HCl (REDNESS RELIEVER EYE DROPS OP) Place 1 drop into both eyes as needed (for red eyes).      No current facility-administered medications for this visit.     PHYSICAL EXAMINATION:  ECOG PERFORMANCE STATUS: 1 - Symptomatic but completely ambulatory Vitals:   11/14/17 1431  BP: 114/66  Pulse: 70  Resp: 16  Temp: 97.9 F (36.6 C)    Filed Weights   11/14/17 1431  Weight: 123 lb 6 oz (56 kg)     Physical Exam  Constitutional: She is oriented to person, place, and time and well-developed, well-nourished, and in no distress. No distress.  HENT:  Head: Normocephalic and atraumatic.  Mouth/Throat: No oropharyngeal exudate.  Eyes: Pupils are equal, round, and reactive to light. EOM are normal. No scleral icterus.  Neck: Normal range of motion. Neck supple. No JVD present.  Cardiovascular:  No murmur heard. Pulmonary/Chest: Effort normal and breath sounds normal. No respiratory distress.  Abdominal: Soft. Bowel sounds are normal. She exhibits no distension. There is no tenderness. There is no rebound.  Musculoskeletal: Normal range of motion. She exhibits no edema.  Lymphadenopathy:    She has no cervical adenopathy.  Neurological: She is alert and oriented to person, place, and time.  Skin: Skin is warm and dry. No erythema.  Psychiatric: Affect and judgment normal.       SKIN: LABORATORY DATA: I have personally  reviewed the data as listed: CBC    Component Value Date/Time   WBC 3.3 (L) 11/09/2017 1114   RBC 3.73 (L) 11/09/2017 1114   HGB 11.6 (L) 11/09/2017 1114   HCT 34.3 (L) 11/09/2017 1114   PLT 167 11/09/2017 1114   MCV 91.7 11/09/2017 1114   MCH 30.9 11/09/2017 1114   MCHC 33.7 11/09/2017 1114   RDW 14.0 11/09/2017 1114   LYMPHSABS 1.0 11/09/2017 1114   MONOABS 0.3 11/09/2017 1114   EOSABS 0.1 11/09/2017 1114   BASOSABS 0.0 11/09/2017 1114   CMP Latest Ref Rng & Units 11/09/2017 08/27/2017 05/30/2017  Glucose 70 - 99 mg/dL 88 87 113(H)  BUN 8 - 23 mg/dL '10 10 9  ' Creatinine 0.44 - 1.00 mg/dL 0.54 0.49 0.58  Sodium 135 - 145 mmol/L 134(L) 130(L) 133(L)  Potassium 3.5 - 5.1 mmol/L 4.5 3.7 3.8  Chloride 98 - 111 mmol/L 103 100(L) 103  CO2 22 - 32 mmol/L 23 21(L) 24  Calcium 8.9 - 10.3 mg/dL 10.1 10.0 9.8  Total Protein 6.5 - 8.1 g/dL 7.5 7.4 7.0  Total Bilirubin 0.3 - 1.2 mg/dL 0.9 0.8 0.5  Alkaline Phos 38 - 126 U/L 52 59 62  AST 15 - 41 U/L '16 16 17  ' ALT 0 - 44 U/L 9 9(L) 12(L)    pathology 11/16/2016 Surgical Pathology  CASE: [id number]  PATIENT: Mariyana Antkowiak  Surgical Pathology Report   SPECIMEN SUBMITTED:  A. Retroperitoneal adenopathy, left  DIAGNOSIS:  A. LYMPH NODE, LEFT RETROPERITONEAL; CT-GUIDED CORE BIOPSY:  - METASTATIC HIGH-GRADE SEROUS CARCINOMA.   Comment:   RADIOGRAPHIC STUDIES: I have personally reviewed the radiological images as listed and agree with the findings in the report CT chest abdomen pelvis with contrast 11/13/2016 IMPRESSION: 1. Large heterogeneous central pelvic mass measures up to 12.6 cm. The lesion generates substantial mass-effect on the uterus, bladder, and pelvic sidewall anatomy. The mass appears to invade the mid sigmoid colon. Etiology of the central pelvic mass is not definitive by CT, but ovarian primary is suspected. The lesion becomes indistinguishable from the posterior uterus on some images and uterine origin is possible,  but the uterus does not appear to be the epicenter of the mass and appears to be more displaced by it. MRI of the pelvis without and with contrast may prove helpful to better delineate the relationship of the uterus to the central pelvic mass although it may not be able to definitively localize the origin. 2. Bulky retroperitoneal and left pelvic sidewall lymphadenopathy. The retroperitoneal lymphadenopathy generates substantial mass-effect on the IVC and left renal vein. The external iliac veins  along each pelvic sidewall are markedly attenuated by the mass/lymphadenopathy. Patency of these vessels cannot be definitely confirmed on this exam.  Pathology 03/14/2017   DIAGNOSIS:  A. OMENTUM; OMENTECTOMY:  - NO TUMOR SEEN.  - ONE NEGATIVE LYMPH NODE (0/1).   B. RIGHT FALLOPIAN TUBE AND OVARY; SALPINGO-OOPHORECTOMY:  - SMALL FOCI OF HIGH GRADE SEROUS CARCINOMA INVOLVING THE OVARY.  - MARKED THERAPY RELATED CHANGE.  - NO TUMOR SEEN IN THE FALLOPIAN TUBE.   C. UTERUS, CERVIX, LEFT FALLOPIAN TUBE AND OVARY; HYSTERECTOMY AND LEFT  SALPINGO-OOPHORECTOMY:  - NABOTHIAN CYSTS.  - CYSTIC ATROPHY OF THE ENDOMETRIUM.  - SEROSAL ADHESIONS.  - UNREMARKABLE FALLOPIAN TUBE.  - OVARY SHOWING TREATMENT RELATED CHANGE.   D. 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   Results for LAURALIE, BLACKSHER" (MRN [id number]) as of 11/15/2017 11:04  Ref. Range 11/16/2016 08:47 12/25/2016 09:26 01/15/2017 09:05 02/05/2017 10:07 02/28/2017 13:35 03/28/2017 08:27 05/09/2017 08:35 07/04/2017 14:49 08/27/2017 09:45 11/09/2017 11:14  Cancer Antigen (CA) 125 Latest Ref Range: 0.0 - 38.1 U/mL 4,382.0 (H) 699.1 (H) 207.6 (H) 64.6 (H) 36.9 31.9 7.4 11.3 14.1 21.0   ASSESSMENT/PLAN Cancer Staging Malignant neoplasm of ovary (HCC) Staging form: Ovary, Fallopian Tube, and Primary Peritoneal Carcinoma, AJCC 8th Edition - Clinical stage from 11/22/2016:  Stage IIIC (cT3c, cN1b, cM0) - Signed by Earlie Server, MD on 11/22/2016  1. Malignant neoplasm of ovary, unspecified laterality ([place])     # Ovarian Cancer,  Clinically doing well.  However CA125 levels gradually climbing up although still within normal limits. I discussed with GYN oncology Dr.Berchuck will proceed with surveillance image with CT chest abdomen pelvis with contrast # Anemia: stable. monitor All questions were answered. The patient knows to call the clinic with any problems, questions or concerns. Follow up in 3 months or earlier if CT showed progression.  Orders Placed This Encounter  Procedures  . CT Chest W Contrast    Standing Status:   Future    Standing Expiration Date:   11/14/2018    Order Specific Question:   If indicated for the ordered procedure, I authorize the administration of contrast media per Radiology protocol    Answer:   Yes    Order Specific Question:   Preferred imaging location?    Answer:   [facility]    Order Specific Question:   Radiology Contrast Protocol - do NOT remove file path    Answer:   \\[email or web address]  . CT Abdomen Pelvis W Contrast    Standing Status:   Future    Standing Expiration Date:   11/14/2018    Order Specific Question:   If indicated for the ordered procedure, I authorize the administration of contrast media per Radiology protocol    Answer:   Yes    Order Specific Question:   Preferred imaging location?    Answer:   [facility]    Order Specific Question:   Is Oral Contrast requested for this exam?    Answer:   Yes, Per Radiology protocol    Order Specific Question:   Radiology Contrast Protocol - do NOT remove file path    Answer:   \\[email or web address]   Total face to face encounter time for this patient visit was 25 min. >50% of the time was  spent in counseling and coordination of care.   Earlie Server, MD, PhD Hematology Oncology [facility] at  [facility] Pager- [phone] 11/14/2017

## 2017-11-14 NOTE — Progress Notes (Signed)
Gynecologic Oncology Visit   Referring Provider: Dr. 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  Subjective:  Katie Hill is a 67 y.o. G0P0 female with advanced ovarian cancer s/p 4 cycles of neoadjuvant chemotherapy who presents for follow up.   Received 4 cycles of neoadjuvant carbo/taxol and on 03/14/2017 she underwent L/S converted to XL, TAH, BSO, right PA node resection, omentectomy, and LOA to no residual disease. Then had additional 3 cycles of chemo.  CA125 was 7.4 05/09/17 and then has risen to 14 in 5/19 and 21 in 8/19.  Today reports some mild abdominal bloating, but no other symptoms.  Oncology history Katie Hill is a pleasant. G0P0 female with advanced ovarian cancer.  She presented to the ER 11/13/2016 with symptoms of right lower quadrant pain for 3-4 weeks, associated with nausea, bloating, no change in weight, urinary frequency, and change in bowel habits with more diffucult and stringy BM's.  CT CHEST, ABDOMEN, AND PELVIS WITH CONTRAST IMPRESSION: 1. Large heterogeneous central pelvic mass measures up to 12.6 cm. The lesion generates substantial mass-effect on the uterus, bladder, and pelvic sidewall anatomy. The mass appears to invade the mid sigmoid colon. Etiology of the central pelvic mass is not definitive by CT, but ovarian primary is suspected. The lesion becomes indistinguishable from the posterior uterus on some images and uterine origin is possible, but the uterus does not appear to be the epicenter of the mass and appears to be more displaced by it. MRI of the pelvis without and with contrast may prove helpful to better delineate the relationship of the uterus to the central pelvic mass although it may not be able to definitively localize the origin. 2. Bulky retroperitoneal and left pelvic sidewall lymphadenopathy. The retroperitoneal lymphadenopathy generates substantial mass-effect on the IVC and left  renal vein. The external iliac veins along each pelvic sidewall are markedly attenuated by the mass/lymphadenopathy. Patency of these vessels cannot be definitely confirmed on this exam.  11/16/16 LYMPH NODE, LEFT RETROPERITONEAL; CT-GUIDED CORE BIOPSY:  - METASTATIC HIGH-GRADE SEROUS CARCINOMA.   Decision was made to do neoadjuvant carbo/taxol chemotherapy.  CA125 fell from 4,382 to 64.6 (10/29).  CT scan shows dramatic response.  CT scan 10/14 Vascular/Lymphatic: Normal appearance of the abdominal aorta. Interval decrease in size of previously identified bulky retroperitoneal adenopathy. Index aortocaval node measures 1.6 x 2.6 cm, image 32 of series 2. Previously 4.4 x 5.3 cm. Index left periaortic Hill mass measures 2.2 x 1.3 cm, image 31 of series 2. Previously 3.7 x 4.7 cm. At the level of the bifurcation there is a low-attenuation Hill mass which measure 3.1 x 2.6 cm, image 44 of series 2. Previously 4.3 x 4.8 cm. Left common iliac node measures 1.2 cm, image 53 of series 2. Previously 2.2 cm. Left external iliac node measures 1.6 x 1.1 cm, image 67 of series 2. Previously 4.8 x 2.6 cm. Reproductive: Large mass centered around the uterus measures 9.4 x 5.5 cm, image 67 of series 2. Previously this measured 12.6 x 9.2 Cm  On 03/14/2017 she underwent L/S converted to XL, TAH, BSO, right PA node resection, omentectomy, and LOA to no residual disease.   Pathology 03/14/2017   DIAGNOSIS:  A. OMENTUM; OMENTECTOMY:  - NO TUMOR SEEN.  - ONE NEGATIVE LYMPH NODE (0/1).   B. RIGHT FALLOPIAN TUBE AND OVARY; SALPINGO-OOPHORECTOMY:  - SMALL FOCI OF HIGH GRADE SEROUS CARCINOMA INVOLVING THE OVARY.  - MARKED THERAPY RELATED CHANGE.  -  NO TUMOR SEEN IN THE FALLOPIAN TUBE.   C. UTERUS, CERVIX, LEFT FALLOPIAN TUBE AND OVARY; HYSTERECTOMY AND LEFT  SALPINGO-OOPHORECTOMY:  - NABOTHIAN CYSTS.  - CYSTIC ATROPHY OF THE ENDOMETRIUM.  - SEROSAL ADHESIONS.  - UNREMARKABLE FALLOPIAN TUBE.   - OVARY SHOWING TREATMENT RELATED CHANGE.   D. 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   Then completed an additional 3 cycles of carbo/taxol.   Genetic testing negative for 83 genes on [facility] Multi-Cancer panel (ALK, APC, ATM, AXIN2, BAP1, BARD1, BLM, BMPR1A, BRCA1, BRCA2, BRIP1, CASR, CDC73, CDH1, CDK4, CDKN1B, CDKN1C, CDKN2A, CEBPA, CHEK2, CTNNA1, DICER1, DIS3L2, EGFR, EPCAM, FH, FLCN, GATA2, GPC3, GREM1, HOXB13, HRAS, KIT, MAX, MEN1, MET, MITF, MLH1, MSH2, MSH3, MSH6, MUTYH, NBN, NF1, NF2, NTHL1, PALB2, PDGFRA, PHOX2B, PMS2, POLD1, POLE, POT1, PRKAR1A, PTCH1, PTEN, RAD50, RAD51C, RAD51D, RB1, RECQL4, RET, RUNX1, SDHA, SDHAF2, SDHB, SDHC, SDHD, SMAD4, SMARCA4, SMARCB1, SMARCE1, STK11, SUFU, TERC, TERT, TMEM127, TP53, TSC1, TSC2, VHL, WRN, WT1).  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 This is still considered a normal result.   Somatic tumor testing:  She has HRD positive cancer, negative for somatic or germline BRCA mutation.   Problem List: Patient Active Problem List   Diagnosis Date Noted  . Hyponatremia 12/12/2016  . Dehydration 12/08/2016  . Malignant neoplasm of ovary ([place]) 11/20/2016  . Pelvic mass 11/15/2016    Past Medical History: Past Medical History:  Diagnosis Date  . High grade ovarian cancer ([place]) 11/20/2016  . Pelvic mass in female     Past Surgical History: Past Surgical History:  Procedure Laterality Date  . APPENDECTOMY    . PORTA CATH INSERTION N/A 11/27/2016   Procedure: Katie Hill Cath Insertion;  Surgeon: Algernon Huxley, MD;  Location: [facility];  Service: Cardiovascular;  Laterality: N/A;   Past Gynecologic History:  Menarche: 17 Last Menstrual Period: 24 years ago History of Abnormal pap: no Last pap: years ago She does not have regular medical cancer and has not has screening mammogram, colonoscopy, or Pap smears. She has  not had an abnormal Pap.   OB History:  OB History  Gravida Para Term Preterm AB Living  0 0 0 0 0 0  SAB TAB Ectopic Multiple Live Births  0 0 0 0 0        Family History: Family History  Problem Relation Age of Onset  . Throat cancer Cousin   . Throat cancer Cousin     Social History: Social History   Socioeconomic History  . Marital status: Married    Spouse name: Not on file  . Number of children: Not on file  . Years of education: Not on file  . Highest education level: Not on file  Social Needs  . Financial resource strain: Not on file  . Food insecurity - worry: Not on file  . Food insecurity - inability: Not on file  . Transportation needs - medical: Not on file  . Transportation needs - non-medical: Not on file  Occupational History  . Not on file  Tobacco Use  . Smoking status: Never Smoker  . Smokeless tobacco: Never Used  Substance and Sexual Activity  . Alcohol use: Yes    Comment: occasional   . Drug use: No  . Sexual activity: Yes    Birth control/protection: Post-menopausal  Other Topics Concern  . Not on file  Social History Narrative  . Not on file  Allergies: Allergies  Allergen Reactions  . Omeprazole Rash    Current Medications: Current Outpatient Medications  Medication Sig Dispense Refill  . Calcium Carbonate Antacid (TUMS E-X PO) Take by mouth.    . dexamethasone (DECADRON) 4 MG tablet Take 5 tablets (20 mg total) by mouth daily. Take 20 mg daily for 2 days prior to your chemotherapy and take 46m daily for 2 days after chemotherapy. 20 tablet 2  . hydrocortisone 2.5 % cream Apply topically 2 (two) times daily. 30 g 0  . lidocaine-prilocaine (EMLA) cream Apply 1 application topically as needed. Apply small amount to port site at least 1 hour prior to it being accessed, cover with plastic wrap 30 g 1  . ondansetron (ZOFRAN) 4 MG tablet Take 1 tablet (4 mg total) by mouth daily as needed for nausea or vomiting. 30 tablet 0  .  oxyCODONE (OXY IR/ROXICODONE) 5 MG immediate release tablet Take 1 tablet (5 mg total) by mouth every 4 (four) hours as needed for severe pain. 30 tablet 0  . senna (SENOKOT) 8.6 MG TABS tablet Take 2 tablets (17.2 mg total) by mouth daily. Hold if loose stool. 120 each 0  . Tetrahydrozoline HCl (REDNESS RELIEVER EYE DROPS OP) Apply 1 drop to eye daily as needed (red eye).     Current Facility-Administered Medications  Medication Dose Route Frequency Provider Last Rate Last Dose  . ondansetron (ZOFRAN) injection 4 mg  4 mg Intravenous Once DAlgernon Huxley MD        Review of Systems General: no complaints  HEENT: no complaints  Lungs: no complaints  Cardiac: no complaints  GI: incisional pain, constipation managed with stool softeners  GU: vaginal spotting  Musculoskeletal: no complaints  Extremities: no complaints  Skin: no complaints  Neuro: no complaints  Endocrine: no complaints  Psych: no complaints     Objective:  Physical Examination:   ECOG Performance Status: 1 - Symptomatic but completely ambulatory  General appearance: alert, cooperative and appears stated age HEENT:PERRLA, extra ocular movement intact and sclera clear, anicteric Lymph node survey: non-palpable, axillary, and supraclavicular, bilateral palpable inguinal nodes but not enlarged. Cardiovascular: regular rate and rhythm Respiratory: normal air entry, lungs clear to auscultation Abdomen: soft, NTND, no masses or ascites; incision healing well and no hernias Back: inspection of back is normal Extremities: extremities normal, atraumatic, no cyanosis or edema Skin exam - normal coloration and turgor, no rashes, no suspicious skin lesions noted. Neurological exam reveals alert, oriented, normal speech, no focal findings or movement disorder noted.  Pelvic: exam chaperoned by nurse;  Vulva: normal appearing vulva with no masses, tenderness or lesions; Vagina: normal vagina, cuff healing small area of redness on  right apex; Adnexa/Uterus/Cervix: surgically absent; BME negative for masses or nodularity. Rectal: deferred   LABS: Component     Latest Ref Rng & Units 03/28/2017 05/09/2017 07/04/2017 08/27/2017  Cancer Antigen (CA) 125     0.0 - 38.1 U/mL 31.9 7.4 11.3 14.1   Component     Latest Ref Rng & Units 11/09/2017  Cancer Antigen (CA) 125     0.0 - 38.1 U/mL 21.0     Assessment:  EKyaira Tranthamis a 67y.o. female diagnosed with high grade serous ovarian cancer on CT directed node biopsy with partial bowel obstruction and extensive bulky adenopathy 8/18. s/p 4 cycles of carbo/taxol chemotherapy with dramatic decline in CA125 and improvement on CT scan. Interval debulking surgery to no gross residual on 03/14/2017. Reinitiation of  chemotherapy on 03/28/2017 with normalization of CA125.  Now with rising CA125 within the normal range from 7 to 21.  She does not have a germline mutation in BRCA1/2 or other related gene, but somatic tumor testing is positive for HRD.   Medical co-morbidities complicating care: prior abdominal surgery.  Plan:   Problem List Items Addressed This Visit      Genitourinary   Malignant neoplasm of ovary ([place]) - Primary   Relevant Orders   CBC with Differential   Comprehensive metabolic panel   Magnesium   CA 125     Will order CT scan C/A/P to look for evidence of recurrent disease in view of rising CA125.    Continued follow up with Dr. Tasia Catchings. RTC to Gynecologic Oncology clinic in 2 months.    Olaparib maintenance therapy may be an option if she has recurrent disease and has a response to second line therapy.   Mellody Drown, MD   CC:  [address],  [address] [place], [place] [address]

## 2017-11-14 NOTE — Progress Notes (Signed)
Patient here today for follow up.  Patient states no new concerns today  

## 2017-11-21 ENCOUNTER — Telehealth: Payer: Self-pay | Admitting: *Deleted

## 2017-11-21 NOTE — Telephone Encounter (Signed)
Patient's CT ABD/PELVIS/CHEST is now scheduled for 11/23/17 @ 8:00a.m  I called her and made her aware of the location, date and time of this scheduled appt.  NPO and to pick up day before 11/23/17 Patient is aware!!

## 2017-11-23 ENCOUNTER — Ambulatory Visit
Admission: RE | Admit: 2017-11-23 | Discharge: 2017-11-23 | Disposition: A | Discharge status: Home / Self Care | Payer: Medicare Other | Source: Ambulatory Visit | Attending: Oncology | Admitting: Oncology

## 2017-11-23 ENCOUNTER — Telehealth: Payer: Self-pay

## 2017-11-23 DIAGNOSIS — R591 Generalized enlarged lymph nodes: Secondary | ICD-10-CM | POA: Diagnosis not present

## 2017-11-23 DIAGNOSIS — C569 Malignant neoplasm of unspecified ovary: Secondary | ICD-10-CM | POA: Insufficient documentation

## 2017-11-23 DIAGNOSIS — R918 Other nonspecific abnormal finding of lung field: Secondary | ICD-10-CM | POA: Insufficient documentation

## 2017-11-23 DIAGNOSIS — N289 Disorder of kidney and ureter, unspecified: Secondary | ICD-10-CM | POA: Diagnosis not present

## 2017-11-23 MED ORDER — IOPAMIDOL (ISOVUE-300) INJECTION 61%
100.0000 mL | Freq: Once | INTRAVENOUS | Status: AC | PRN
  Administered 2017-11-23: 100 mL via INTRAVENOUS

## 2017-11-26 ENCOUNTER — Inpatient Hospital Stay: Payer: Medicare Other

## 2017-11-26 ENCOUNTER — Telehealth: Payer: Self-pay

## 2017-11-26 DIAGNOSIS — Z90722 Acquired absence of ovaries, bilateral: Secondary | ICD-10-CM | POA: Diagnosis not present

## 2017-11-26 DIAGNOSIS — Z452 Encounter for adjustment and management of vascular access device: Secondary | ICD-10-CM | POA: Diagnosis not present

## 2017-11-26 DIAGNOSIS — C569 Malignant neoplasm of unspecified ovary: Secondary | ICD-10-CM | POA: Diagnosis not present

## 2017-11-26 DIAGNOSIS — Z95828 Presence of other vascular implants and grafts: Secondary | ICD-10-CM

## 2017-11-26 DIAGNOSIS — Z9221 Personal history of antineoplastic chemotherapy: Secondary | ICD-10-CM | POA: Diagnosis not present

## 2017-11-26 DIAGNOSIS — D649 Anemia, unspecified: Secondary | ICD-10-CM | POA: Diagnosis not present

## 2017-11-26 DIAGNOSIS — Z90710 Acquired absence of both cervix and uterus: Secondary | ICD-10-CM | POA: Diagnosis not present

## 2017-11-26 MED ORDER — HEPARIN SOD (PORK) LOCK FLUSH 100 UNIT/ML IV SOLN
500.0000 [IU] | Freq: Once | INTRAVENOUS | Status: AC
  Administered 2017-11-26: 500 [IU] via INTRAVENOUS

## 2017-11-26 MED ORDER — SODIUM CHLORIDE 0.9% FLUSH
10.0000 mL | Freq: Once | INTRAVENOUS | Status: AC
  Administered 2017-11-26: 10 mL via INTRAVENOUS
  Filled 2017-11-26: qty 10

## 2017-11-26 NOTE — Telephone Encounter (Signed)
error 

## 2017-11-26 NOTE — Telephone Encounter (Signed)
Called and went over CT results with Katie Hill. Dr Fransisca Connors would like to see her back in one month with repeat Ca 125. Appt for lab and Gyn Onc arranged. All questions answered. Dr. Tasia Catchings will keep appt in three months for right now.  IMPRESSION: 1. Interval debulking surgery with decrease in retroperitoneal and extraperitoneal lymphadenopathy. In some areas the lymphadenopathy has resolved completely. Peritoneal implant seen along the falciform ligament on the prior study has decreased. 2. 4 x 2 cm collection of complex fluid or soft tissue in the right cul-de-sac is in the region of the complex mass lesion seen on the previous study. Given that there is some apparent enhancement peripherally, residual/recurrent disease is a concern and close attention will be required. 3. Stable tiny bilateral pulmonary nodules with no pleural effusion.    Oncology Nurse Navigator Documentation  Navigator Location: [facility] (11/26/17 0900)   )Navigator Encounter Type: Telephone;Diagnostic Results (11/26/17 0900)                                                    Time Spent with Patient: 15 (11/26/17 0900)

## 2017-12-24 ENCOUNTER — Inpatient Hospital Stay: Payer: Medicare Other | Attending: Oncology

## 2017-12-24 DIAGNOSIS — C772 Secondary and unspecified malignant neoplasm of intra-abdominal lymph nodes: Secondary | ICD-10-CM | POA: Insufficient documentation

## 2017-12-24 DIAGNOSIS — Z90722 Acquired absence of ovaries, bilateral: Secondary | ICD-10-CM | POA: Insufficient documentation

## 2017-12-24 DIAGNOSIS — Z90710 Acquired absence of both cervix and uterus: Secondary | ICD-10-CM | POA: Diagnosis not present

## 2017-12-24 DIAGNOSIS — Z9221 Personal history of antineoplastic chemotherapy: Secondary | ICD-10-CM | POA: Insufficient documentation

## 2017-12-24 DIAGNOSIS — C569 Malignant neoplasm of unspecified ovary: Secondary | ICD-10-CM | POA: Insufficient documentation

## 2017-12-24 MED ORDER — SODIUM CHLORIDE 0.9% FLUSH
10.0000 mL | Freq: Once | INTRAVENOUS | Status: AC
  Administered 2017-12-24: 10 mL via INTRAVENOUS
  Filled 2017-12-24: qty 10

## 2017-12-24 MED ORDER — HEPARIN SOD (PORK) LOCK FLUSH 100 UNIT/ML IV SOLN
500.0000 [IU] | Freq: Once | INTRAVENOUS | Status: AC
  Administered 2017-12-24: 500 [IU] via INTRAVENOUS

## 2017-12-25 LAB — CA 125: Cancer Antigen (CA) 125: 25.7 U/mL (ref 0.0–38.1)

## 2017-12-26 ENCOUNTER — Inpatient Hospital Stay (HOSPITAL_BASED_OUTPATIENT_CLINIC_OR_DEPARTMENT_OTHER): Payer: Medicare Other | Admitting: Obstetrics and Gynecology

## 2017-12-26 VITALS — BP 103/69 | HR 72 | Temp 97.2°F | Resp 18 | Ht 68.0 in | Wt 123.2 lb

## 2017-12-26 DIAGNOSIS — C569 Malignant neoplasm of unspecified ovary: Secondary | ICD-10-CM

## 2017-12-26 DIAGNOSIS — Z90710 Acquired absence of both cervix and uterus: Secondary | ICD-10-CM

## 2017-12-26 DIAGNOSIS — Z9221 Personal history of antineoplastic chemotherapy: Secondary | ICD-10-CM | POA: Diagnosis not present

## 2017-12-26 DIAGNOSIS — Z90722 Acquired absence of ovaries, bilateral: Secondary | ICD-10-CM

## 2017-12-26 DIAGNOSIS — C772 Secondary and unspecified malignant neoplasm of intra-abdominal lymph nodes: Secondary | ICD-10-CM

## 2017-12-26 NOTE — Progress Notes (Signed)
Gynecologic Oncology Visit   Referring Provider: Dr. 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  Subjective:  Katie Hill is a 67 y.o. G0P0 female with advanced ovarian cancer s/p 4 cycles of neoadjuvant chemotherapy who presents for follow up.   Received 4 cycles of neoadjuvant carbo/taxol and on 03/14/2017 she underwent L/S converted to XL, TAH, BSO, right PA node resection, omentectomy, and LOA to no residual disease. Then had additional 3 cycles of chemo.  CA125 was 7.4 05/09/17 and then has risen to 14 in 5/19 and 21 in 8/19.   CT scan 11/22/17 IMPRESSION: 1. Interval debulking surgery with decrease in retroperitoneal and extraperitoneal lymphadenopathy. In some areas the lymphadenopathy has resolved completely. Peritoneal implant seen along the falciform ligament on the prior study has decreased. 2. 4 x 2 cm collection of complex fluid or soft tissue in the right cul-de-sac is in the region of the complex mass lesion seen on the previous study. Given that there is some apparent enhancement peripherally, residual/recurrent disease is a concern and close attention will be required. 3. Stable tiny bilateral pulmonary nodules with no pleural effusion.  CA125 25.7 (12/24/17)  Today reports feeling well. Denies fatigue, weight loss, abdominal bloating or distension.   Oncology History: Katie Hill is a pleasant. G0P0 female with advanced ovarian cancer.  She presented to the ER 11/13/2016 with symptoms of right lower quadrant pain for 3-4 weeks, associated with nausea, bloating, no change in weight, urinary frequency, and change in bowel habits with more diffucult and stringy BM's.  CT CHEST, ABDOMEN, AND PELVIS WITH CONTRAST IMPRESSION: 1. Large heterogeneous central pelvic mass measures up to 12.6 cm. The lesion generates substantial mass-effect on the uterus, bladder, and pelvic sidewall anatomy. The mass appears to invade the mid sigmoid  colon. Etiology of the central pelvic mass is not definitive by CT, but ovarian primary is suspected. The lesion becomes indistinguishable from the posterior uterus on some images and uterine origin is possible, but the uterus does not appear to be the epicenter of the mass and appears to be more displaced by it. MRI of the pelvis without and with contrast may prove helpful to better delineate the relationship of the uterus to the central pelvic mass although it may not be able to definitively localize the origin. 2. Bulky retroperitoneal and left pelvic sidewall lymphadenopathy. The retroperitoneal lymphadenopathy generates substantial mass-effect on the IVC and left renal vein. The external iliac veins along each pelvic sidewall are markedly attenuated by the mass/lymphadenopathy. Patency of these vessels cannot be definitely confirmed on this exam.  11/16/16 LYMPH NODE, LEFT RETROPERITONEAL; CT-GUIDED CORE BIOPSY:  - METASTATIC HIGH-GRADE SEROUS CARCINOMA.   Decision was made to do neoadjuvant carbo/taxol chemotherapy.  CA125 fell from 4,382 to 64.6 (10/29).  CT scan showed dramatic response.  CT scan 10/14 Vascular/Lymphatic: Normal appearance of the abdominal aorta. Interval decrease in size of previously identified bulky retroperitoneal adenopathy. Index aortocaval node measures 1.6 x 2.6 cm, image 32 of series 2. Previously 4.4 x 5.3 cm. Index left periaortic nodal mass measures 2.2 x 1.3 cm, image 31 of series 2. Previously 3.7 x 4.7 cm. At the level of the bifurcation there is a low-attenuation nodal mass which measure 3.1 x 2.6 cm, image 44 of series 2. Previously 4.3 x 4.8 cm. Left common iliac node measures 1.2 cm, image 53 of series 2. Previously 2.2 cm. Left external iliac node measures 1.6 x 1.1 cm, image 67  of series 2. Previously 4.8 x 2.6 cm. Reproductive: Large mass centered around the uterus measures 9.4 x 5.5 cm, image 67 of series 2. Previously this measured 12.6 x 9.2 cm  On  03/14/2017 she underwent L/S converted to XL, TAH, BSO, right PA node resection, omentectomy, and LOA to no residual disease.   Pathology 03/14/2017  DIAGNOSIS:  A. OMENTUM; OMENTECTOMY:  - NO TUMOR SEEN.  - ONE NEGATIVE LYMPH NODE (0/1).   B. RIGHT FALLOPIAN TUBE AND OVARY; SALPINGO-OOPHORECTOMY:  - SMALL FOCI OF HIGH GRADE SEROUS CARCINOMA INVOLVING THE OVARY.  - MARKED THERAPY RELATED CHANGE.  - NO TUMOR SEEN IN THE FALLOPIAN TUBE.   C. UTERUS, CERVIX, LEFT FALLOPIAN TUBE AND OVARY; HYSTERECTOMY AND LEFT  SALPINGO-OOPHORECTOMY:  - NABOTHIAN CYSTS.  - CYSTIC ATROPHY OF THE ENDOMETRIUM.  - SEROSAL ADHESIONS.  - UNREMARKABLE FALLOPIAN TUBE.  - OVARY SHOWING TREATMENT RELATED CHANGE.   D. 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   Then completed an additional 3 cycles of carbo/taxol.   Genetic testing negative for 83 genes on [facility] Multi-Cancer panel (ALK, APC, ATM, AXIN2, BAP1, BARD1, BLM, BMPR1A, BRCA1, BRCA2, BRIP1, CASR, CDC73, CDH1, CDK4, CDKN1B, CDKN1C, CDKN2A, CEBPA, CHEK2, CTNNA1, DICER1, DIS3L2, EGFR, EPCAM, FH, FLCN, GATA2, GPC3, GREM1, HOXB13, HRAS, KIT, MAX, MEN1, MET, MITF, MLH1, MSH2, MSH3, MSH6, MUTYH, NBN, NF1, NF2, NTHL1, PALB2, PDGFRA, PHOX2B, PMS2, POLD1, POLE, POT1, PRKAR1A, PTCH1, PTEN, RAD50, RAD51C, RAD51D, RB1, RECQL4, RET, RUNX1, SDHA, SDHAF2, SDHB, SDHC, SDHD, SMAD4, SMARCA4, SMARCB1, SMARCE1, STK11, SUFU, TERC, TERT, TMEM127, TP53, TSC1, TSC2, VHL, WRN, WT1).  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 This is still considered a normal result.   Somatic tumor testing:  She has HRD positive cancer, negative for somatic or germline BRCA mutation.   Problem List: Patient Active Problem List   Diagnosis Date Noted  . Hyponatremia 12/12/2016  . Dehydration 12/08/2016  . Malignant neoplasm of ovary ([place]) 11/20/2016  . Pelvic mass 11/15/2016     Past Medical History: Past Medical History:  Diagnosis Date  . High grade ovarian cancer ([place]) 11/20/2016  . Pelvic mass in female     Past Surgical History: Past Surgical History:  Procedure Laterality Date  . APPENDECTOMY    . PORTA CATH INSERTION N/A 11/27/2016   Procedure: Katie Hill Cath Insertion;  Surgeon: Algernon Huxley, MD;  Location: [facility];  Service: Cardiovascular;  Laterality: N/A;   Past Gynecologic History:  Menarche: 17 Last Menstrual Period: 24 years ago History of Abnormal pap: no Last pap: years ago She does not have regular medical cancer and has not has screening mammogram, colonoscopy, or Pap smears. She has not had an abnormal Pap.   OB History:  OB History  Gravida Para Term Preterm AB Living  0 0 0 0 0 0  SAB TAB Ectopic Multiple Live Births  0 0 0 0 0        Family History: Family History  Problem Relation Age of Onset  . Throat cancer Cousin   . Throat cancer Cousin     Social History: Social History   Socioeconomic History  . Marital status: Married    Spouse name: Not on file  . Number of children: Not on file  . Years of education: Not on file  . Highest education level: Not on file  Social Needs  . Financial resource strain: Not on file  . Food insecurity - worry:  Not on file  . Food insecurity - inability: Not on file  . Transportation needs - medical: Not on file  . Transportation needs - non-medical: Not on file  Occupational History  . Not on file  Tobacco Use  . Smoking status: Never Smoker  . Smokeless tobacco: Never Used  Substance and Sexual Activity  . Alcohol use: Yes    Comment: occasional   . Drug use: No  . Sexual activity: Yes    Birth control/protection: Post-menopausal  Other Topics Concern  . Not on file  Social History Narrative  . Not on file    Allergies: Allergies  Allergen Reactions  . Omeprazole Rash    Current Medications: Current Outpatient Medications  Medication Sig  Dispense Refill  . Calcium Carbonate Antacid (TUMS E-X PO) Take by mouth.    . dexamethasone (DECADRON) 4 MG tablet Take 5 tablets (20 mg total) by mouth daily. Take 20 mg daily for 2 days prior to your chemotherapy and take 84m daily for 2 days after chemotherapy. 20 tablet 2  . hydrocortisone 2.5 % cream Apply topically 2 (two) times daily. 30 g 0  . lidocaine-prilocaine (EMLA) cream Apply 1 application topically as needed. Apply small amount to port site at least 1 hour prior to it being accessed, cover with plastic wrap 30 g 1  . ondansetron (ZOFRAN) 4 MG tablet Take 1 tablet (4 mg total) by mouth daily as needed for nausea or vomiting. 30 tablet 0  . oxyCODONE (OXY IR/ROXICODONE) 5 MG immediate release tablet Take 1 tablet (5 mg total) by mouth every 4 (four) hours as needed for severe pain. 30 tablet 0  . senna (SENOKOT) 8.6 MG TABS tablet Take 2 tablets (17.2 mg total) by mouth daily. Hold if loose stool. 120 each 0  . Tetrahydrozoline HCl (REDNESS RELIEVER EYE DROPS OP) Apply 1 drop to eye daily as needed (red eye).     Current Facility-Administered Medications  Medication Dose Route Frequency Provider Last Rate Last Dose  . ondansetron (ZOFRAN) injection 4 mg  4 mg Intravenous Once DAlgernon Huxley MD        Review of Systems General: no complaints  HEENT: no complaints  Lungs: no complaints  Cardiac: no complaints  GI: incisional pain, constipation managed with stool softeners  GU: vaginal spotting  Musculoskeletal: no complaints  Extremities: no complaints  Skin: no complaints  Neuro: no complaints  Endocrine: no complaints  Psych: no complaints     Objective:  Physical Examination:   ECOG Performance Status: 1 - Symptomatic but completely ambulatory  General appearance: alert, cooperative and appears stated age HEENT:PERRLA, extra ocular movement intact and sclera clear, anicteric Lymph node survey: non-palpable, axillary, and supraclavicular, bilateral palpable  inguinal nodes but not enlarged. Cardiovascular: regular rate and rhythm Respiratory: normal air entry, lungs clear to auscultation Abdomen: soft, NTND, no masses or ascites; incision healing well and no hernias Back: inspection of back is normal Extremities: extremities normal, atraumatic, no cyanosis or edema Skin exam - normal coloration and turgor, no rashes, no suspicious skin lesions noted. Neurological exam reveals alert, oriented, normal speech, no focal findings or movement disorder noted.  Pelvic: exam chaperoned by nurse;  Vulva: normal appearing vulva with no masses, tenderness or lesions; Vagina: normal vagina, cuff healing small area of redness on right apex; Adnexa/Uterus/Cervix: surgically absent; BME negative for masses or nodularity. Rectal: deferred   LABS: Component     Latest Ref Rng & Units 03/28/2017 05/09/2017 07/04/2017 08/27/2017  Cancer Antigen (CA) 125     0.0 - 38.1 U/mL 31.9 7.4 11.3 14.1   Component     Latest Ref Rng & Units 11/09/2017  Cancer Antigen (CA) 125     0.0 - 38.1 U/mL 21.0     Assessment:  Katie Hill is a 67 y.o. female diagnosed with high grade serous ovarian cancer on CT directed node biopsy with partial bowel obstruction and extensive bulky adenopathy 8/18. s/p 4 cycles of carbo/taxol chemotherapy with dramatic decline in CA125 and improvement on CT scan. Interval debulking surgery to no gross residual on 03/14/2017. Reinitiation of chemotherapy on 03/28/2017 with normalization of CA125.  Now with rising CA125 within the normal range from 7 to 25.  She does not have a germline mutation in BRCA1/2 or other related gene, but somatic tumor testing is positive for HRD.   Medical co-morbidities complicating care: prior abdominal surgery.  Plan:   Problem List Items Addressed This Visit      Genitourinary   Malignant neoplasm of ovary ([place]) - Primary   Relevant Orders   CBC with Differential   Comprehensive metabolic panel    Magnesium   CA 125     In view of rising CA125 will monitor closely and she will RTC with CA125 in 2 months.  Olaparib maintenance therapy may be an option if she has recurrent disease and has a response to second line therapy.   Mellody Drown, MD   CC:  [address],  [place], [place] [address]

## 2017-12-26 NOTE — Addendum Note (Signed)
Addended by: Mariea Clonts D on: 12/26/2017 03:58 PM   Modules accepted: Orders

## 2017-12-26 NOTE — Progress Notes (Signed)
Pt has no complaints of GYN

## 2018-01-07 ENCOUNTER — Encounter

## 2018-01-08 NOTE — Progress Notes (Signed)
Spoke with Dr. Tasia Catchings regarding follow up. Dr. Fransisca Connors feels that since we are seeing Katie Hill back in 2 months for Gyn Onc assessment she can cancel appointment in November with Dr. Tasia Catchings. Dr. Tasia Catchings approved and message sent to scheduling. Katie Hill is aware that we were going to ask Dr. Tasia Catchings if appointment could be cancelled and is awaiting answer. Oncology Nurse Navigator Documentation  Navigator Location: [facility] (01/08/18 0900)   )Navigator Encounter Type: Other;Follow-up Appt (01/08/18 0900)                                                    Time Spent with Patient: 15 (01/08/18 0900)

## 2018-01-15 ENCOUNTER — Ambulatory Visit

## 2018-01-16 ENCOUNTER — Ambulatory Visit

## 2018-02-08 ENCOUNTER — Other Ambulatory Visit

## 2018-02-14 ENCOUNTER — Ambulatory Visit: Admitting: Oncology

## 2018-02-18 ENCOUNTER — Inpatient Hospital Stay: Payer: Medicare Other

## 2018-02-18 DIAGNOSIS — C569 Malignant neoplasm of unspecified ovary: Secondary | ICD-10-CM

## 2018-02-18 DIAGNOSIS — Z90722 Acquired absence of ovaries, bilateral: Secondary | ICD-10-CM | POA: Diagnosis not present

## 2018-02-18 DIAGNOSIS — Z9221 Personal history of antineoplastic chemotherapy: Secondary | ICD-10-CM | POA: Insufficient documentation

## 2018-02-18 DIAGNOSIS — Z90710 Acquired absence of both cervix and uterus: Secondary | ICD-10-CM | POA: Insufficient documentation

## 2018-02-19 LAB — CA 125: Cancer Antigen (CA) 125: 46.3 U/mL — ABNORMAL HIGH (ref 0.0–38.1)

## 2018-02-20 ENCOUNTER — Inpatient Hospital Stay: Payer: Medicare Other | Attending: Obstetrics and Gynecology | Admitting: Obstetrics and Gynecology

## 2018-02-20 ENCOUNTER — Other Ambulatory Visit: Payer: Self-pay | Admitting: Nurse Practitioner

## 2018-02-20 ENCOUNTER — Inpatient Hospital Stay: Payer: Medicare Other

## 2018-02-20 VITALS — BP 104/64 | HR 79 | Temp 98.0°F | Resp 18 | Ht 68.0 in | Wt 123.0 lb

## 2018-02-20 DIAGNOSIS — Z90722 Acquired absence of ovaries, bilateral: Secondary | ICD-10-CM | POA: Diagnosis not present

## 2018-02-20 DIAGNOSIS — Z9221 Personal history of antineoplastic chemotherapy: Secondary | ICD-10-CM | POA: Diagnosis not present

## 2018-02-20 DIAGNOSIS — C569 Malignant neoplasm of unspecified ovary: Secondary | ICD-10-CM

## 2018-02-20 DIAGNOSIS — Z90710 Acquired absence of both cervix and uterus: Secondary | ICD-10-CM

## 2018-02-20 DIAGNOSIS — Z95828 Presence of other vascular implants and grafts: Secondary | ICD-10-CM

## 2018-02-20 MED ORDER — HEPARIN SOD (PORK) LOCK FLUSH 100 UNIT/ML IV SOLN
500.0000 [IU] | Freq: Once | INTRAVENOUS | Status: AC
  Administered 2018-02-20: 500 [IU] via INTRAVENOUS

## 2018-02-20 MED ORDER — SODIUM CHLORIDE 0.9% FLUSH
10.0000 mL | Freq: Once | INTRAVENOUS | Status: AC
  Administered 2018-02-20: 10 mL via INTRAVENOUS
  Filled 2018-02-20: qty 10

## 2018-02-20 NOTE — Progress Notes (Signed)
Pt had bloating every day and sometimes gas pains. No other issues

## 2018-02-20 NOTE — Progress Notes (Signed)
Gynecologic Oncology Interval Visit   Referring Provider: Dr. 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  Subjective:  Katie Hill is a 67 y.o. G0P0 female with advanced ovarian cancer s/p 4 cycles of neoadjuvant chemotherapy who presents for follow up.   Received 4 cycles of neoadjuvant carbo/taxol and on 03/14/2017 she underwent L/S converted to XL, TAH, BSO, right PA node resection, omentectomy, and LOA to no residual disease. Then had additional 3 cycles of chemo.  CA 125 was 7.4 05/09/17 and then has risen to 14 in 5/19 and 21 in 8/19.   CT scan 11/22/17 IMPRESSION: 1. Interval debulking surgery with decrease in retroperitoneal and extraperitoneal lymphadenopathy. In some areas the lymphadenopathy has resolved completely. Peritoneal implant seen along the falciform ligament on the prior study has decreased. 2. 4 x 2 cm collection of complex fluid or soft tissue in the right cul-de-sac is in the region of the complex mass lesion seen on the previous study. Given that there is some apparent enhancement peripherally, residual/recurrent disease is a concern and close attention will be required. 3. Stable tiny bilateral pulmonary nodules with no pleural effusion.  CA125   25.7 12/24/17  46.3 02/18/18  Today reports abdominal bloating and gas like pains nearly every day. She denies fatigue, weight change, weakness, shortness of breath.    Oncology History: Katie Hill  is a pleasant. G0P0 female with advanced ovarian cancer.  She presented to the ER 11/13/2016 with symptoms of right lower quadrant pain for 3-4 weeks, associated with nausea, bloating, no change in weight, urinary frequency, and change in bowel habits with more diffucult and stringy BM's.  CT CHEST, ABDOMEN, AND PELVIS WITH CONTRAST IMPRESSION: 1. Large heterogeneous central pelvic mass measures up to 12.6 cm. The lesion generates substantial mass-effect on the uterus, bladder,  and pelvic sidewall anatomy. The mass appears to invade the mid sigmoid colon. Etiology of the central pelvic mass is not definitive by CT, but ovarian primary is suspected. The lesion becomes indistinguishable from the posterior uterus on some images and uterine origin is possible, but the uterus does not appear to be the epicenter of the mass and appears to be more displaced by it. MRI of the pelvis without and with contrast may prove helpful to better delineate the relationship of the uterus to the central pelvic mass although it may not be able to definitively localize the origin. 2. Bulky retroperitoneal and left pelvic sidewall lymphadenopathy. The retroperitoneal lymphadenopathy generates substantial mass-effect on the IVC and left renal vein. The external iliac veins along each pelvic sidewall are markedly attenuated by the mass/lymphadenopathy. Patency of these vessels cannot be definitely confirmed on this exam.  11/16/16 LYMPH NODE, LEFT RETROPERITONEAL; CT-GUIDED CORE BIOPSY:  - METASTATIC HIGH-GRADE SEROUS CARCINOMA.   Decision was made to do neoadjuvant carbo/taxol chemotherapy.  CA125 fell from 4,382 to 64.6 (10/29).  CT scan showed dramatic response.  CT scan 10/14 Vascular/Lymphatic: Normal appearance of the abdominal aorta. Interval decrease in size of previously identified bulky retroperitoneal adenopathy. Index aortocaval node measures 1.6 x 2.6 cm, image 32 of series 2. Previously 4.4 x 5.3 cm. Index left periaortic nodal mass measures 2.2 x 1.3 cm, image 31 of series 2. Previously 3.7 x 4.7 cm. At the level of the bifurcation there is a low-attenuation nodal mass which measure 3.1 x 2.6 cm, image 44 of series 2. Previously 4.3 x 4.8 cm. Left common iliac node measures 1.2 cm, image 53 of  series 2. Previously 2.2 cm. Left external iliac node measures 1.6 x 1.1 cm, image 67 of series 2. Previously 4.8 x 2.6 cm. Reproductive: Large mass centered around the uterus measures 9.4 x 5.5  cm, image 67 of series 2. Previously this measured 12.6 x 9.2 cm  On 03/14/2017 she underwent L/S converted to XL, TAH, BSO, right PA node resection, omentectomy, and LOA to no residual disease.   Pathology 03/14/2017  DIAGNOSIS:  A. OMENTUM; OMENTECTOMY:  - NO TUMOR SEEN.  - ONE NEGATIVE LYMPH NODE (0/1).   B. RIGHT FALLOPIAN TUBE AND OVARY; SALPINGO-OOPHORECTOMY:  - SMALL FOCI OF HIGH GRADE SEROUS CARCINOMA INVOLVING THE OVARY.  - MARKED THERAPY RELATED CHANGE.  - NO TUMOR SEEN IN THE FALLOPIAN TUBE.   C. UTERUS, CERVIX, LEFT FALLOPIAN TUBE AND OVARY; HYSTERECTOMY AND LEFT  SALPINGO-OOPHORECTOMY:  - NABOTHIAN CYSTS.  - CYSTIC ATROPHY OF THE ENDOMETRIUM.  - SEROSAL ADHESIONS.  - UNREMARKABLE FALLOPIAN TUBE.  - OVARY SHOWING TREATMENT RELATED CHANGE.   D. 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   Then completed an additional 3 cycles of carbo/taxol.   Genetic testing negative for 83 genes on [facility]'s Multi-Cancer panel (ALK, APC, ATM, AXIN2, BAP1, BARD1, BLM, BMPR1A, BRCA1, BRCA2, BRIP1, CASR, CDC73, CDH1, CDK4, CDKN1B, CDKN1C, CDKN2A, CEBPA, CHEK2, CTNNA1, DICER1, DIS3L2, EGFR, EPCAM, FH, FLCN, GATA2, GPC3, GREM1, HOXB13, HRAS, KIT, MAX, MEN1, MET, MITF, MLH1, MSH2, MSH3, MSH6, MUTYH, NBN, NF1, NF2, NTHL1, PALB2, PDGFRA, PHOX2B, PMS2, POLD1, POLE, POT1, PRKAR1A, PTCH1, PTEN, RAD50, RAD51C, RAD51D, RB1, RECQL4, RET, RUNX1, SDHA, SDHAF2, SDHB, SDHC, SDHD, SMAD4, SMARCA4, SMARCB1, SMARCE1, STK11, SUFU, TERC, TERT, TMEM127, TP53, TSC1, TSC2, VHL, WRN, WT1).  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 This is still considered a normal result.   Somatic tumor testing:  She has HRD positive cancer, negative for somatic or germline BRCA mutation.   Patient Active Problem List   Diagnosis Date Noted  . Genetic testing 03/28/2017  . S/P total abdominal hysterectomy and  bilateral salpingo-oophorectomy 03/14/2017  . Hyponatremia 12/12/2016  . Dehydration 12/08/2016  . Malignant neoplasm of ovary ([place]) 11/20/2016  . Pelvic mass 11/15/2016   Past Medical History:  Diagnosis Date  . Dysrhythmia   . Genetic testing 03/28/2017   Multi-Cancer panel (83 genes) @ [facility] - No pathogenic mutations detected  . High grade ovarian cancer ([place]) 11/20/2016  . Pelvic mass in female    Past Surgical History:  Procedure Laterality Date  . APPENDECTOMY    . LAPAROSCOPY N/A 03/14/2017   Procedure: LAPAROSCOPY OPERATIVE;  Surgeon: Mellody Drown, MD;  Location: [facility];  Service: Gynecology;  Laterality: N/A;  . LAPAROTOMY N/A 03/14/2017   Procedure: LAPAROTOMY;  Surgeon: Mellody Drown, MD;  Location: [facility];  Service: Gynecology;  Laterality: N/A;  . LYMPH NODE DISSECTION N/A 03/14/2017   Procedure: LYMPH NODE DISSECTION;  Surgeon: Mellody Drown, MD;  Location: [facility];  Service: Gynecology;  Laterality: N/A;  . OMENTECTOMY N/A 03/14/2017   Procedure: OMENTECTOMY;  Surgeon: Mellody Drown, MD;  Location: [facility];  Service: Gynecology;  Laterality: N/A;  . PORTA CATH INSERTION N/A 11/27/2016   Procedure: Glori Luis Cath Insertion;  Surgeon: Algernon Huxley, MD;  Location: [facility];  Service: Cardiovascular;  Laterality: N/A;   Past Gynecologic History:  Menarche: 17 Last Menstrual Period: 24 years ago History of Abnormal pap: no Last pap: years ago She does not have regular medical cancer and has  not has screening mammogram, colonoscopy, or Pap smears. She has not had an abnormal Pap.   OB History    Gravida  0   Para  0   Term  0   Preterm  0   AB  0   Living  0     SAB  0   TAB  0   Ectopic  0   Multiple  0   Live Births  0          Family History  Problem Relation Age of Onset  . Throat cancer Cousin   . Throat cancer Cousin   . Leukemia Cousin    Social History   Socioeconomic History  . Marital status: Married     Spouse name: Not on file  . Number of children: Not on file  . Years of education: Not on file  . Highest education level: Not on file  Occupational History  . Not on file  Social Needs  . Financial resource strain: Not on file  . Food insecurity:    Worry: Not on file    Inability: Not on file  . Transportation needs:    Medical: Not on file    Non-medical: Not on file  Tobacco Use  . Smoking status: Never Smoker  . Smokeless tobacco: Never Used  Substance and Sexual Activity  . Alcohol use: No    Frequency: Never    Comment: use to be occ. but not any since 5 months  . Drug use: No  . Sexual activity: Yes    Birth control/protection: Post-menopausal  Lifestyle  . Physical activity:    Days per week: Not on file    Minutes per session: Not on file  . Stress: Not on file  Relationships  . Social connections:    Talks on phone: Not on file    Gets together: Not on file    Attends religious service: Not on file    Active member of club or organization: Not on file    Attends meetings of clubs or organizations: Not on file    Relationship status: Not on file  Other Topics Concern  . Not on file  Social History Narrative  . Not on file   Allergies  Allergen Reactions  . Omeprazole Rash   Current Outpatient Medications on File Prior to Visit  Medication Sig Dispense Refill  . calcium carbonate (TUMS - DOSED IN MG ELEMENTAL CALCIUM) 500 MG chewable tablet Chew 1 tablet by mouth as needed for indigestion or heartburn.    . lidocaine-prilocaine (EMLA) cream Apply 1 application topically as needed. Apply small amount to port site at least 1 hour prior to it being accessed, cover with plastic wrap 30 g 1  . ondansetron (ZOFRAN) 4 MG tablet Take 1 tablet (4 mg total) by mouth daily as needed for nausea or vomiting. 30 tablet 0  . Tetrahydrozoline HCl (REDNESS RELIEVER EYE DROPS OP) Place 1 drop into both eyes as needed (for red eyes).      No current facility-administered  medications on file prior to visit.     Review of Systems General:  no complaints Skin: no complaints Eyes: no complaints HEENT: no complaints Breasts: no complaints Pulmonary: no complaints Cardiac: no complaints Gastrointestinal: per HPI Genitourinary/Sexual: no complaints Ob/Gyn: no complaints Musculoskeletal: no complaints Hematology: no complaints Neurologic/Psych: no complaints  Objective:  Physical Examination:  Today's Vitals   02/20/18 1332  BP: 104/64  Pulse: 79  Resp: 18  Temp: 98 F (36.7 C)  TempSrc: Tympanic  Weight: 123 lb (55.8 kg)  Height: _0  (1.727 m)  PainSc: 0-No pain   Body mass index is 18.7 kg/m.  ECOG Performance Status: 1 - Symptomatic but completely ambulatory  GENERAL: Patient is a well appearing female in no acute distress HEENT:  PERRL, neck supple with midline trachea. Thyroid without masses.  NODES:  No cervical, supraclavicular, axillary, or inguinal lymphadenopathy palpated. Previously bilateral pelvic nodes palpable but not enlarged. LUNGS:  Clear to auscultation bilaterally.  No wheezes or rhonchi. HEART:  Regular rate and rhythm. No murmur appreciated. ABDOMEN:  Soft, nontender.  Positive, normoactive bowel sounds.  MSK:  No focal spinal tenderness to palpation. Full range of motion bilaterally in the upper extremities. EXTREMITIES:  No peripheral edema.   SKIN:  Clear with no obvious rashes or skin changes. No nail dyscrasia. NEURO:  Nonfocal. Well oriented.  Appropriate affect.  Pelvic: exam chaperoned by nurse;  Vulva: normal appearing vulva with no masses, tenderness or lesions; Vagina: normal vagina, cuff healing small area of redness on right apex; Adnexa/Uterus/Cervix: surgically absent; BME negative for masses or nodularity. Rectal: deferred.  Assessment:  Charity Tessier is a 67 y.o. female diagnosed with high grade serous ovarian cancer on CT directed node biopsy with partial bowel obstruction and extensive  bulky adenopathy 8/18. s/p 4 cycles of carbo/taxol chemotherapy with dramatic decline in CA125 and improvement on CT scan. Interval debulking surgery to no gross residual on 03/14/2017.  Reinitiation of chemotherapy on 03/28/2017 with normalization of CA125.  Now with rising CA125 (46.3 on 02/18/18) and having some abdominal discomfort.  She does not have a germline mutation in BRCA1/2 or other related gene, but somatic tumor testing is positive for HRD.     Medical co-morbidities complicating care: prior abdominal surgery.  Plan:   Problem List Items Addressed This Visit      Genitourinary   Malignant neoplasm of ovary ([place]) - Primary   In view of rising CA125 and symptoms will repeat CT scan and if recurrence seen will plan to restart chemotherapy with carbo/doxil since she is platinum sensitive.  If she responds well then will transition to maintenance Olaparib in view of HRD positive cancer.    CC:  Gae Dry,  MD [address] [place], [place] [address]  Mellody Drown, MD

## 2018-02-28 ENCOUNTER — Ambulatory Visit
Admission: RE | Admit: 2018-02-28 | Discharge: 2018-02-28 | Disposition: A | Discharge status: Home / Self Care | Payer: Medicare Other | Source: Ambulatory Visit | Attending: Nurse Practitioner | Admitting: Nurse Practitioner

## 2018-02-28 DIAGNOSIS — R590 Localized enlarged lymph nodes: Secondary | ICD-10-CM | POA: Diagnosis not present

## 2018-02-28 DIAGNOSIS — R140 Abdominal distension (gaseous): Secondary | ICD-10-CM | POA: Diagnosis not present

## 2018-02-28 DIAGNOSIS — C569 Malignant neoplasm of unspecified ovary: Secondary | ICD-10-CM | POA: Insufficient documentation

## 2018-02-28 DIAGNOSIS — R918 Other nonspecific abnormal finding of lung field: Secondary | ICD-10-CM | POA: Diagnosis not present

## 2018-02-28 DIAGNOSIS — C562 Malignant neoplasm of left ovary: Secondary | ICD-10-CM | POA: Diagnosis not present

## 2018-02-28 DIAGNOSIS — I700 Atherosclerosis of aorta: Secondary | ICD-10-CM | POA: Insufficient documentation

## 2018-02-28 LAB — POCT I-STAT CREATININE: Creatinine, Ser: 0.7 mg/dL (ref 0.44–1.00)

## 2018-02-28 MED ORDER — IOPAMIDOL (ISOVUE-300) INJECTION 61%
80.0000 mL | Freq: Once | INTRAVENOUS | Status: AC | PRN
  Administered 2018-02-28: 80 mL via INTRAVENOUS

## 2018-03-04 ENCOUNTER — Encounter: Payer: Self-pay | Admitting: Oncology

## 2018-03-04 ENCOUNTER — Inpatient Hospital Stay (HOSPITAL_BASED_OUTPATIENT_CLINIC_OR_DEPARTMENT_OTHER): Payer: Medicare Other | Admitting: Oncology

## 2018-03-04 ENCOUNTER — Other Ambulatory Visit: Payer: Self-pay

## 2018-03-04 VITALS — BP 104/69 | HR 87 | Temp 97.9°F | Resp 18 | Wt 122.8 lb

## 2018-03-04 DIAGNOSIS — C569 Malignant neoplasm of unspecified ovary: Secondary | ICD-10-CM

## 2018-03-04 DIAGNOSIS — Z90722 Acquired absence of ovaries, bilateral: Secondary | ICD-10-CM | POA: Diagnosis not present

## 2018-03-04 DIAGNOSIS — Z9221 Personal history of antineoplastic chemotherapy: Secondary | ICD-10-CM | POA: Diagnosis not present

## 2018-03-04 DIAGNOSIS — Z95828 Presence of other vascular implants and grafts: Secondary | ICD-10-CM

## 2018-03-04 DIAGNOSIS — Z90710 Acquired absence of both cervix and uterus: Secondary | ICD-10-CM | POA: Diagnosis not present

## 2018-03-04 MED ORDER — ONDANSETRON HCL 8 MG PO TABS: 8.0000 mg | ORAL_TABLET | Freq: Two times a day (BID) | ORAL | 1 refills | Status: DC | PRN

## 2018-03-04 MED ORDER — PROCHLORPERAZINE MALEATE 10 MG PO TABS: 10.0000 mg | ORAL_TABLET | Freq: Four times a day (QID) | ORAL | 1 refills | Status: DC | PRN

## 2018-03-04 MED ORDER — DEXAMETHASONE 4 MG PO TABS: 8.0000 mg | ORAL_TABLET | Freq: Every day | ORAL | 1 refills | Status: DC

## 2018-03-04 MED ORDER — LIDOCAINE-PRILOCAINE 2.5-2.5 % EX CREA: TOPICAL_CREAM | CUTANEOUS | 3 refills | Status: DC

## 2018-03-04 NOTE — Progress Notes (Signed)
ON PATHWAY REGIMEN - Ovarian  No Change  Continue With Treatment as Ordered.     A cycle is every 21 days:     Paclitaxel      Carboplatin   **Always confirm dose/schedule in your pharmacy ordering system**  Patient Characteristics: Newly Diagnosed, Neoadjuvant Therapy AJCC T Category: T3c AJCC N Category: N1b AJCC M Category: M0 Therapeutic Status: Newly Diagnosed, Neoadjuvant Therapy AJCC 8 Stage Grouping: IIIC Intent of Therapy: Curative Intent, Discussed with Patient

## 2018-03-04 NOTE — Progress Notes (Signed)
[facility] Follow up visit  Patient Care Team: Patient, [name], [name], [name], [name], MD as Referring Physician (Obstetrics and Gynecology)  CHIEF COMPLAINTS/PURPOSE OF Visit Follow up for Treatment of  ovarian cancer.  HISTORY OF PRESENTING ILLNESS: Katie Hill 67 y.o. female presents for follow up of management of stage IIIC ovarian cancer. She underwent  Neoadjuvant chemotherapy of carbo and taxol x 4  # Patient had debulking surgery on 03/14/2017. She had a laparoscopy with conversion to laparotomy, total hysterectomy, with bilateral salpingo oophorectomy, right aortic lymph node dissection, omentectomy. Pathology showed small foci of residual disease in ovary.   Genetic testing negative for83 genes on [facility]'s Multi-Cancer panel (ALK, APC, ATM, AXIN2, BAP1, BARD1, BLM, BMPR1A, BRCA1, BRCA2, BRIP1, CASR, CDC73, CDH1, CDK4, CDKN1B, CDKN1C, CDKN2A, CEBPA, CHEK2, CTNNA1, DICER1, DIS3L2, EGFR, EPCAM, FH, FLCN, GATA2, GPC3, GREM1, HOXB13, HRAS, KIT, MAX, MEN1, MET, MITF, MLH1, MSH2, MSH3, MSH6, MUTYH, NBN, NF1, NF2, NTHL1, PALB2, PDGFRA, PHOX2B, PMS2, POLD1, POLE, POT1, PRKAR1A, PTCH1, PTEN, RAD50, RAD51C, RAD51D, RB1, RECQL4, RET, RUNX1, SDHA, SDHAF2, SDHB, SDHC, SDHD, SMAD4, SMARCA4, SMARCB1, SMARCE1, STK11, SUFU, TERC, TERT, TMEM127, TP53, TSC1, TSC2, VHL, WRN, WT1).  A Variant of UncertainSignificancewas detected: CASRc.106G>A (p.Gly36Arg). Myraid testing negative for somatic BRACA1/2, positive for HRD.   # HRD positive, was referred to [facility] for clinical trials of Olarparib maintenance. She opted out.   Treatment:  #s/p Carboplatin and taxol x 4 neoadjuvant chemotherapy followed by debulking surgery 03/14/2017 # 03/28/2017 S/p Adjuvant carbo and taxol x3     INTERVAL HISTORY 67 y.o. female with above oncology history reviewed by me today presents for follow-up of stage IIIC ovarian cancer.  During the interval, patient has had lab work-up done which reviewed rising Ca125 to 46.3 on 02/18/2018. Interval CT chest abdomen pelvis showed interval enlargement off and aorto caval lymph node which currently measures 1.4 cm in short axis.  This lymph node is in close proximity to the previously resected retroperitoneal lymphadenopathy and it was a concern for additional nodal metastasis.Small lung nodules stable.  Patient was accompanied by her husband to today's visit.  Overall she reports doing well.  Denies any fatigue, weight loss.  Occasionally she has some right lower abdominal pain/bloating and gas-like pains  Review of Systems  Constitutional: Negative for appetite change, chills, fatigue and fever.  HENT:   Negative for hearing loss and voice change.   Eyes: Negative for eye problems.  Respiratory: Negative for chest tightness and cough.   Cardiovascular: Negative for chest pain.  Gastrointestinal: Positive for abdominal pain. Negative for abdominal distention and blood in stool.  Endocrine: Negative for hot flashes.  Genitourinary: Negative for difficulty urinating and frequency.   Musculoskeletal: Negative for arthralgias.  Skin: Negative for itching and rash.  Neurological: Negative for extremity weakness.  Hematological: Negative for adenopathy.  Psychiatric/Behavioral: Negative for confusion.     MEDICAL HISTORY: Past Medical History:  Diagnosis Date  . Dysrhythmia   . Genetic testing 03/28/2017   Multi-Cancer panel (83 genes) @ [facility] - No pathogenic mutations detected  . High grade ovarian cancer ([place]) 11/20/2016  . Pelvic mass in female     SURGICAL HISTORY: Past Surgical History:  Procedure Laterality Date  . APPENDECTOMY    . LAPAROSCOPY N/A 03/14/2017   Procedure: LAPAROSCOPY OPERATIVE;  Surgeon:  Mellody Drown,  MD;  Location: [facility];  Service: Gynecology;  Laterality: N/A;  . LAPAROTOMY N/A 03/14/2017   Procedure: LAPAROTOMY;  Surgeon: Mellody Drown, MD;  Location: [facility];  Service: Gynecology;  Laterality: N/A;  . LYMPH NODE DISSECTION N/A 03/14/2017   Procedure: LYMPH NODE DISSECTION;  Surgeon: Mellody Drown, MD;  Location: [facility];  Service: Gynecology;  Laterality: N/A;  . OMENTECTOMY N/A 03/14/2017   Procedure: OMENTECTOMY;  Surgeon: Mellody Drown, MD;  Location: [facility];  Service: Gynecology;  Laterality: N/A;  . PORTA CATH INSERTION N/A 11/27/2016   Procedure: Glori Luis Cath Insertion;  Surgeon: Algernon Huxley, MD;  Location: [facility];  Service: Cardiovascular;  Laterality: N/A;    SOCIAL HISTORY: Social History   Socioeconomic History  . Marital status: Married    Spouse name: Not on file  . Number of children: Not on file  . Years of education: Not on file  . Highest education level: Not on file  Occupational History  . Not on file  Social Needs  . Financial resource strain: Not on file  . Food insecurity:    Worry: Not on file    Inability: Not on file  . Transportation needs:    Medical: Not on file    Non-medical: Not on file  Tobacco Use  . Smoking status: Never Smoker  . Smokeless tobacco: Never Used  Substance and Sexual Activity  . Alcohol use: No    Frequency: Never    Comment: use to be occ. but not any since 5 months  . Drug use: No  . Sexual activity: Yes    Birth control/protection: Post-menopausal  Lifestyle  . Physical activity:    Days per week: Not on file    Minutes per session: Not on file  . Stress: Not on file  Relationships  . Social connections:    Talks on phone: Not on file    Gets together: Not on file    Attends religious service: Not on file    Active member of club or organization: Not on file    Attends meetings of clubs or organizations: Not on file    Relationship status: Not on file  . Intimate partner violence:     Fear of current or ex partner: Not on file    Emotionally abused: Not on file    Physically abused: Not on file    Forced sexual activity: Not on file  Other Topics Concern  . Not on file  Social History Narrative  . Not on file    FAMILY HISTORY Family History  Problem Relation Age of Onset  . Throat cancer Cousin   . Throat cancer Cousin   . Leukemia Cousin     ALLERGIES:  is allergic to omeprazole.  MEDICATIONS:  Current Outpatient Medications  Medication Sig Dispense Refill  . calcium carbonate (TUMS - DOSED IN MG ELEMENTAL CALCIUM) 500 MG chewable tablet Chew 1 tablet by mouth as needed for indigestion or heartburn.    . lidocaine-prilocaine (EMLA) cream Apply 1 application topically as needed. Apply small amount to port site at least 1 hour prior to it being accessed, cover with plastic wrap 30 g 1  . ondansetron (ZOFRAN) 4 MG tablet Take 1 tablet (4 mg total) by mouth daily as needed for nausea or vomiting. 30 tablet 0  . Tetrahydrozoline HCl (REDNESS RELIEVER EYE DROPS OP) Place 1 drop into both eyes as needed (for red eyes).  No current facility-administered medications for this visit.     PHYSICAL EXAMINATION:  ECOG PERFORMANCE STATUS: 1 - Symptomatic but completely ambulatory Vitals:   03/04/18 1325  BP: 104/69  Pulse: 87  Resp: 18  Temp: 97.9 F (36.6 C)    Filed Weights   03/04/18 1325  Weight: 122 lb 12.8 oz (55.7 kg)     Physical Exam  Constitutional: She is oriented to person, place, and time and well-developed, well-nourished, and in no distress. No distress.  HENT:  Head: Normocephalic and atraumatic.  Nose: Nose normal.  Mouth/Throat: Oropharynx is clear and moist. No oropharyngeal exudate.  Eyes: Pupils are equal, round, and reactive to light. EOM are normal. No scleral icterus.  Neck: Normal range of motion. Neck supple. No JVD present.  Cardiovascular: Normal rate and regular rhythm.  No murmur heard. Pulmonary/Chest: Effort  normal and breath sounds normal. No respiratory distress. She has no rales. She exhibits no tenderness.  Abdominal: Soft. Bowel sounds are normal. She exhibits no distension. There is no tenderness. There is no rebound.  Musculoskeletal: Normal range of motion. She exhibits no edema.  Lymphadenopathy:    She has no cervical adenopathy.  Neurological: She is alert and oriented to person, place, and time. No cranial nerve deficit. She exhibits normal muscle tone. Coordination normal.  Skin: Skin is warm and dry. She is not diaphoretic. No erythema.  Psychiatric: Affect and judgment normal.       SKIN: LABORATORY DATA: I have personally reviewed the data as listed: CBC    Component Value Date/Time   WBC 3.3 (L) 11/09/2017 1114   RBC 3.73 (L) 11/09/2017 1114   HGB 11.6 (L) 11/09/2017 1114   HCT 34.3 (L) 11/09/2017 1114   PLT 167 11/09/2017 1114   MCV 91.7 11/09/2017 1114   MCH 30.9 11/09/2017 1114   MCHC 33.7 11/09/2017 1114   RDW 14.0 11/09/2017 1114   LYMPHSABS 1.0 11/09/2017 1114   MONOABS 0.3 11/09/2017 1114   EOSABS 0.1 11/09/2017 1114   BASOSABS 0.0 11/09/2017 1114   CMP Latest Ref Rng & Units 02/28/2018 11/09/2017 08/27/2017  Glucose 70 - 99 mg/dL - 88 87  BUN 8 - 23 mg/dL - 10 10  Creatinine 0.44 - 1.00 mg/dL 0.70 0.54 0.49  Sodium 135 - 145 mmol/L - 134(L) 130(L)  Potassium 3.5 - 5.1 mmol/L - 4.5 3.7  Chloride 98 - 111 mmol/L - 103 100(L)  CO2 22 - 32 mmol/L - 23 21(L)  Calcium 8.9 - 10.3 mg/dL - 10.1 10.0  Total Protein 6.5 - 8.1 g/dL - 7.5 7.4  Total Bilirubin 0.3 - 1.2 mg/dL - 0.9 0.8  Alkaline Phos 38 - 126 U/L - 52 59  AST 15 - 41 U/L - 16 16  ALT 0 - 44 U/L - 9 9(L)    pathology 11/16/2016 Surgical Pathology  CASE: [id number]  PATIENT: Katie Hill  Surgical Pathology Report   SPECIMEN SUBMITTED:  A. Retroperitoneal adenopathy, left  DIAGNOSIS:  A. LYMPH NODE, LEFT RETROPERITONEAL; CT-GUIDED CORE BIOPSY:  - METASTATIC HIGH-GRADE SEROUS CARCINOMA.    Comment:   RADIOGRAPHIC STUDIES: I have personally reviewed the radiological images as listed and agree with the findings in the report CT chest abdomen pelvis with contrast 11/13/2016 IMPRESSION: 1. Large heterogeneous central pelvic mass measures up to 12.6 cm. The lesion generates substantial mass-effect on the uterus, bladder, and pelvic sidewall anatomy. The mass appears to invade the mid sigmoid colon. Etiology of the central pelvic mass is not definitive  by CT, but ovarian primary is suspected. The lesion becomes indistinguishable from the posterior uterus on some images and uterine origin is possible, but the uterus does not appear to be the epicenter of the mass and appears to be more displaced by it. MRI of the pelvis without and with contrast may prove helpful to better delineate the relationship of the uterus to the central pelvic mass although it may not be able to definitively localize the origin. 2. Bulky retroperitoneal and left pelvic sidewall lymphadenopathy. The retroperitoneal lymphadenopathy generates substantial mass-effect on the IVC and left renal vein. The external iliac veins along each pelvic sidewall are markedly attenuated by the mass/lymphadenopathy. Patency of these vessels cannot be definitely confirmed on this exam.  Pathology 03/14/2017   DIAGNOSIS:  A. OMENTUM; OMENTECTOMY:  - NO TUMOR SEEN.  - ONE NEGATIVE LYMPH NODE (0/1).   B. RIGHT FALLOPIAN TUBE AND OVARY; SALPINGO-OOPHORECTOMY:  - SMALL FOCI OF HIGH GRADE SEROUS CARCINOMA INVOLVING THE OVARY.  - MARKED THERAPY RELATED CHANGE.  - NO TUMOR SEEN IN THE FALLOPIAN TUBE.   C. UTERUS, CERVIX, LEFT FALLOPIAN TUBE AND OVARY; HYSTERECTOMY AND LEFT  SALPINGO-OOPHORECTOMY:  - NABOTHIAN CYSTS.  - CYSTIC ATROPHY OF THE ENDOMETRIUM.  - SEROSAL ADHESIONS.  - UNREMARKABLE FALLOPIAN TUBE.  - OVARY SHOWING TREATMENT RELATED CHANGE.   D. 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   Results for LADEJA, PELHAM" (MRN [id number]) as of 03/04/2018 16:52  Ref. Range 11/16/2016 08:47 12/25/2016 09:26 01/15/2017 09:05 02/05/2017 10:07 02/28/2017 13:35 03/28/2017 08:27 05/09/2017 08:35 07/04/2017 14:49 08/27/2017 09:45 11/09/2017 11:14 12/24/2017 11:25 02/18/2018 14:48  Cancer Antigen (CA) 125 Latest Ref Range: 0.0 - 38.1 U/mL 4,382.0 (H) 699.1 (H) 207.6 (H) 64.6 (H) 36.9 31.9 7.4 11.3 14.1 21.0 25.7 46.3 (H)   ASSESSMENT/PLAN Cancer Staging Malignant neoplasm of ovary (HCC) Staging form: Ovary, Fallopian Tube, and Primary Peritoneal Carcinoma, AJCC 8th Edition - Clinical stage from 11/22/2016: Stage IIIC (cT3c, cN1b, cM0) - Signed by Earlie Server, MD on 11/22/2016  1. High grade ovarian cancer ([place])   2. Malignant neoplasm of ovary, unspecified laterality ([place])   3. Port-A-Cath in place     # Ovarian Cancer, Stage IIIC Status post neoadjuvant chemotherapy followed by debulking surgery followed by adjuvant chemotherapy. She has been in remission for more than 6 months. Ca125 rises, CT chest abdomen pelvis was independent reviewed by me and discussed with the patient and her husband. CT is concerning for local recurrence. I discussed with Dr. Fransisca Connors via email.  There is no role for secondary cytoreduction. We agree to reinitiate carboplatin and Taxol.  If she responds this, will add her on pump inhibitor for maintenance. Plan was discussed with patient and her husband.  She is in agreement and willing to proceed.  Orders Placed This Encounter  Procedures  . CA 125    Standing Status:   Standing    Number of Occurrences:   20    Standing Expiration Date:   03/05/2019  . CBC with Differential    Standing Status:   Standing    Number of Occurrences:   20    Standing Expiration Date:   03/05/2019  . Comprehensive metabolic panel    Standing Status:   Standing    Number of Occurrences:   20    Standing Expiration Date:    03/05/2019  Total face to face encounter time for this patient visit was 25 min. >50%  of the time was  spent in counseling and coordination of care.   Earlie Server, MD, PhD Hematology Oncology [facility] Pager- [phone] 03/04/2018

## 2018-03-04 NOTE — Progress Notes (Signed)
Patient here for follow up. No concerns voiced.  °

## 2018-03-09 ENCOUNTER — Encounter: Payer: Self-pay | Admitting: Oncology

## 2018-03-09 DIAGNOSIS — Z7189 Other specified counseling: Secondary | ICD-10-CM | POA: Insufficient documentation

## 2018-03-15 ENCOUNTER — Other Ambulatory Visit: Payer: Self-pay

## 2018-03-15 ENCOUNTER — Inpatient Hospital Stay: Payer: Medicare Other | Attending: Oncology

## 2018-03-15 ENCOUNTER — Encounter: Payer: Self-pay | Admitting: Oncology

## 2018-03-15 ENCOUNTER — Inpatient Hospital Stay: Payer: Medicare Other

## 2018-03-15 ENCOUNTER — Inpatient Hospital Stay (HOSPITAL_BASED_OUTPATIENT_CLINIC_OR_DEPARTMENT_OTHER): Payer: Medicare Other | Admitting: Oncology

## 2018-03-15 VITALS — BP 119/67 | HR 61 | Resp 18

## 2018-03-15 VITALS — BP 118/70 | HR 67 | Temp 98.1°F | Resp 16 | Wt 124.0 lb

## 2018-03-15 DIAGNOSIS — D6481 Anemia due to antineoplastic chemotherapy: Secondary | ICD-10-CM | POA: Insufficient documentation

## 2018-03-15 DIAGNOSIS — Z5111 Encounter for antineoplastic chemotherapy: Secondary | ICD-10-CM | POA: Diagnosis not present

## 2018-03-15 DIAGNOSIS — D6959 Other secondary thrombocytopenia: Secondary | ICD-10-CM | POA: Diagnosis not present

## 2018-03-15 DIAGNOSIS — C569 Malignant neoplasm of unspecified ovary: Secondary | ICD-10-CM | POA: Diagnosis not present

## 2018-03-15 LAB — CBC WITH DIFFERENTIAL/PLATELET
Abs Immature Granulocytes: 0.04 10*3/uL (ref 0.00–0.07)
BASOS PCT: 0 %
Basophils Absolute: 0 10*3/uL (ref 0.0–0.1)
EOS PCT: 0 %
Eosinophils Absolute: 0 10*3/uL (ref 0.0–0.5)
HCT: 32.8 % — ABNORMAL LOW (ref 36.0–46.0)
HEMOGLOBIN: 11.3 g/dL — AB (ref 12.0–15.0)
Immature Granulocytes: 1 %
Lymphocytes Relative: 20 %
Lymphs Abs: 1.8 10*3/uL (ref 0.7–4.0)
MCH: 31.2 pg (ref 26.0–34.0)
MCHC: 34.5 g/dL (ref 30.0–36.0)
MCV: 90.6 fL (ref 80.0–100.0)
MONO ABS: 0.9 10*3/uL (ref 0.1–1.0)
Monocytes Relative: 10 %
Neutro Abs: 6.2 10*3/uL (ref 1.7–7.7)
Neutrophils Relative %: 69 %
PLATELETS: 192 10*3/uL (ref 150–400)
RBC: 3.62 MIL/uL — AB (ref 3.87–5.11)
RDW: 12.7 % (ref 11.5–15.5)
WBC: 8.9 10*3/uL (ref 4.0–10.5)
nRBC: 0 % (ref 0.0–0.2)

## 2018-03-15 LAB — COMPREHENSIVE METABOLIC PANEL
ALT: 9 U/L (ref 0–44)
ANION GAP: 7 (ref 5–15)
AST: 14 U/L — ABNORMAL LOW (ref 15–41)
Albumin: 4.5 g/dL (ref 3.5–5.0)
Alkaline Phosphatase: 51 U/L (ref 38–126)
BILIRUBIN TOTAL: 0.6 mg/dL (ref 0.3–1.2)
BUN: 17 mg/dL (ref 8–23)
CALCIUM: 9.9 mg/dL (ref 8.9–10.3)
CO2: 25 mmol/L (ref 22–32)
CREATININE: 0.66 mg/dL (ref 0.44–1.00)
Chloride: 100 mmol/L (ref 98–111)
Glucose, Bld: 83 mg/dL (ref 70–99)
Potassium: 3.7 mmol/L (ref 3.5–5.1)
Sodium: 132 mmol/L — ABNORMAL LOW (ref 135–145)
TOTAL PROTEIN: 7.7 g/dL (ref 6.5–8.1)

## 2018-03-15 MED ORDER — FAMOTIDINE IN NACL 20-0.9 MG/50ML-% IV SOLN
20.0000 mg | Freq: Once | INTRAVENOUS | Status: AC
  Administered 2018-03-15: 20 mg via INTRAVENOUS
  Filled 2018-03-15: qty 50

## 2018-03-15 MED ORDER — PALONOSETRON HCL INJECTION 0.25 MG/5ML
0.2500 mg | Freq: Once | INTRAVENOUS | Status: AC
  Administered 2018-03-15: 0.25 mg via INTRAVENOUS
  Filled 2018-03-15: qty 5

## 2018-03-15 MED ORDER — SODIUM CHLORIDE 0.9 % IV SOLN
Freq: Once | INTRAVENOUS | Status: AC
  Administered 2018-03-15: 10:00:00 via INTRAVENOUS
  Filled 2018-03-15: qty 250

## 2018-03-15 MED ORDER — SODIUM CHLORIDE 0.9 % IV SOLN
175.0000 mg/m2 | Freq: Once | INTRAVENOUS | Status: AC
  Administered 2018-03-15: 288 mg via INTRAVENOUS
  Filled 2018-03-15: qty 48

## 2018-03-15 MED ORDER — SODIUM CHLORIDE 0.9 % IV SOLN
371.5000 mg | Freq: Once | INTRAVENOUS | Status: AC
  Administered 2018-03-15: 370 mg via INTRAVENOUS
  Filled 2018-03-15: qty 37

## 2018-03-15 MED ORDER — DIPHENHYDRAMINE HCL 50 MG/ML IJ SOLN
50.0000 mg | Freq: Once | INTRAMUSCULAR | Status: AC
  Administered 2018-03-15: 50 mg via INTRAVENOUS
  Filled 2018-03-15: qty 1

## 2018-03-15 MED ORDER — SODIUM CHLORIDE 0.9% FLUSH
10.0000 mL | Freq: Once | INTRAVENOUS | Status: AC
  Administered 2018-03-15: 10 mL
  Filled 2018-03-15: qty 10

## 2018-03-15 MED ORDER — SODIUM CHLORIDE 0.9 % IV SOLN
20.0000 mg | Freq: Once | INTRAVENOUS | Status: AC
  Administered 2018-03-15: 20 mg via INTRAVENOUS
  Filled 2018-03-15: qty 2

## 2018-03-15 MED ORDER — HEPARIN SOD (PORK) LOCK FLUSH 100 UNIT/ML IV SOLN
500.0000 [IU] | Freq: Once | INTRAVENOUS | Status: AC | PRN
  Administered 2018-03-15: 500 [IU]
  Filled 2018-03-15: qty 5

## 2018-03-15 NOTE — Progress Notes (Signed)
Patient here today for follow up and chemo infusion

## 2018-03-15 NOTE — Progress Notes (Signed)
Katie Hill Follow up visit  Patient Care Team: Patient, [name], [name], [name], [name], MD as Referring Physician (Obstetrics and Gynecology)  CHIEF COMPLAINTS/PURPOSE OF Visit Follow up for Treatment of  ovarian Hill.  HISTORY OF PRESENTING ILLNESS: Katie Hill 67 y.o. female presents for follow up of management of stage IIIC ovarian Hill. She underwent  Neoadjuvant chemotherapy of carbo and taxol x 4  # Patient had debulking surgery on 03/14/2017. She had a laparoscopy with conversion to laparotomy, total hysterectomy, with bilateral salpingo oophorectomy, right aortic lymph node dissection, omentectomy. Pathology showed small foci of residual disease in ovary.   Genetic testing negative for83 genes on [facility]'s Multi-Hill panel (ALK, APC, ATM, AXIN2, BAP1, BARD1, BLM, BMPR1A, BRCA1, BRCA2, BRIP1, CASR, CDC73, CDH1, CDK4, CDKN1B, CDKN1C, CDKN2A, CEBPA, CHEK2, CTNNA1, DICER1, DIS3L2, EGFR, EPCAM, FH, FLCN, GATA2, GPC3, GREM1, HOXB13, HRAS, KIT, MAX, MEN1, MET, MITF, MLH1, MSH2, MSH3, MSH6, MUTYH, NBN, NF1, NF2, NTHL1, PALB2, PDGFRA, PHOX2B, PMS2, POLD1, POLE, POT1, PRKAR1A, PTCH1, PTEN, RAD50, RAD51C, RAD51D, RB1, RECQL4, RET, RUNX1, SDHA, SDHAF2, SDHB, SDHC, SDHD, SMAD4, SMARCA4, SMARCB1, SMARCE1, STK11, SUFU, TERC, TERT, TMEM127, TP53, TSC1, TSC2, VHL, WRN, WT1).  A Variant of UncertainSignificancewas detected: [id number].106G>A (p.Gly36Arg). Myraid testing negative for somatic BRACA1/2, positive for HRD.   # HRD positive, was referred to [facility] for clinical trials of Olarparib maintenance. She opted out.  #  Treatment:  Stage IIIC Ovarian Hill:  #s/p Carboplatin and taxol x 4 neoadjuvant chemotherapy followed by debulking surgery 03/14/2017 # 03/28/2017  S/p Adjuvant carbo and taxol x3   Local recurrence 03/15/2018 Carboplatin and Taxol   INTERVAL HISTORY 67 y.o. female with above oncology history reviewed by me today presents management of recurrence of ovarian Hill. Today she will restart chemotherapy.  Feels well. Started taking Dexamethasone as instructed.  Did not sleep well last night.    Review of Systems  Constitutional: Negative for appetite change, chills, fatigue and fever.  HENT:   Negative for hearing loss and voice change.   Eyes: Negative for eye problems.  Respiratory: Negative for chest tightness and cough.   Cardiovascular: Negative for chest pain.  Gastrointestinal: Negative for abdominal distention, abdominal pain and blood in stool.  Endocrine: Negative for hot flashes.  Genitourinary: Negative for difficulty urinating and frequency.   Musculoskeletal: Negative for arthralgias.  Skin: Negative for itching and rash.  Neurological: Negative for extremity weakness.  Hematological: Negative for adenopathy.  Psychiatric/Behavioral: Negative for confusion.   MEDICAL HISTORY: Past Medical History:  Diagnosis Date  . Dysrhythmia   . Genetic testing 03/28/2017   Multi-Hill panel (83 genes) @ [facility] - No pathogenic mutations detected  . High grade ovarian Hill ([place]) 11/20/2016  . Pelvic mass in female     SURGICAL HISTORY: Past Surgical History:  Procedure Laterality Date  . APPENDECTOMY    . LAPAROSCOPY N/A 03/14/2017   Procedure: LAPAROSCOPY OPERATIVE;  Surgeon: Mellody Drown, MD;  Location: [facility];  Service: Gynecology;  Laterality: N/A;  . LAPAROTOMY N/A 03/14/2017   Procedure: LAPAROTOMY;  Surgeon: Mellody Drown, MD;  Location: [facility];  Service: Gynecology;  Laterality: N/A;  . LYMPH NODE DISSECTION N/A 03/14/2017   Procedure: LYMPH NODE DISSECTION;  Surgeon: Mellody Drown, MD;  Location: [facility];  Service: Gynecology;  Laterality: N/A;  . OMENTECTOMY N/A 03/14/2017  Procedure:  OMENTECTOMY;  Surgeon: Mellody Drown, MD;  Location: [facility];  Service: Gynecology;  Laterality: N/A;  . PORTA CATH INSERTION N/A 11/27/2016   Procedure: Glori Luis Cath Insertion;  Surgeon: Algernon Huxley, MD;  Location: [place] [facility];  Service: Cardiovascular;  Laterality: N/A;    SOCIAL HISTORY: Social History   Socioeconomic History  . Marital status: Married    Spouse name: Not on file  . Number of children: Not on file  . Years of education: Not on file  . Highest education level: Not on file  Occupational History  . Not on file  Social Needs  . Financial resource strain: Not on file  . Food insecurity:    Worry: Not on file    Inability: Not on file  . Transportation needs:    Medical: Not on file    Non-medical: Not on file  Tobacco Use  . Smoking status: Never Smoker  . Smokeless tobacco: Never Used  Substance and Sexual Activity  . Alcohol use: No    Frequency: Never    Comment: use to be occ. but not any since 5 months  . Drug use: No  . Sexual activity: Yes    Birth control/protection: Post-menopausal  Lifestyle  . Physical activity:    Days per week: Not on file    Minutes per session: Not on file  . Stress: Not on file  Relationships  . Social connections:    Talks on phone: Not on file    Gets together: Not on file    Attends religious service: Not on file    Active member of club or organization: Not on file    Attends meetings of clubs or organizations: Not on file    Relationship status: Not on file  . Intimate partner violence:    Fear of current or ex partner: Not on file    Emotionally abused: Not on file    Physically abused: Not on file    Forced sexual activity: Not on file  Other Topics Concern  . Not on file  Social History Narrative  . Not on file    FAMILY HISTORY Family History  Problem Relation Age of Onset  . Throat Hill Cousin   . Throat Hill Cousin   . Leukemia Cousin     ALLERGIES:  is allergic to  omeprazole.  MEDICATIONS:  Current Outpatient Medications  Medication Sig Dispense Refill  . calcium carbonate (TUMS - DOSED IN MG ELEMENTAL CALCIUM) 500 MG chewable tablet Chew 1 tablet by mouth as needed for indigestion or heartburn.    . dexamethasone (DECADRON) 4 MG tablet Take 2 tablets (8 mg total) by mouth daily. Take 2 days before and 2 days after chemotherapy 60 tablet 1  . lidocaine-prilocaine (EMLA) cream Apply 1 application topically as needed. Apply small amount to port site at least 1 hour prior to it being accessed, cover with plastic wrap 30 g 1  . lidocaine-prilocaine (EMLA) cream Apply to affected area once 30 g 3  . ondansetron (ZOFRAN) 4 MG tablet Take 1 tablet (4 mg total) by mouth daily as needed for nausea or vomiting. 30 tablet 0  . ondansetron (ZOFRAN) 8 MG tablet Take 1 tablet (8 mg total) by mouth 2 (two) times daily as needed for refractory nausea / vomiting. Start on day 3 after chemo. 30 tablet 1  . prochlorperazine (COMPAZINE) 10 MG tablet Take 1 tablet (10 mg total) by mouth every 6 (six) hours as needed (  Nausea or vomiting). 30 tablet 1  . Tetrahydrozoline HCl (REDNESS RELIEVER EYE DROPS OP) Place 1 drop into both eyes as needed (for red eyes).      No current facility-administered medications for this visit.     PHYSICAL EXAMINATION:  ECOG PERFORMANCE STATUS: 1 - Symptomatic but completely ambulatory Vitals:   03/15/18 0909 03/15/18 0957  BP: 118/70   Pulse: 67   Resp:  16  Temp:  98.1 F (36.7 C)    Filed Weights   03/15/18 0909  Weight: 124 lb (56.2 kg)     Physical Exam  Constitutional: She is oriented to person, place, and time. No distress.  HENT:  Head: Normocephalic and atraumatic.  Mouth/Throat: No oropharyngeal exudate.  Eyes: Pupils are equal, round, and reactive to light. EOM are normal. No scleral icterus.  Neck: Normal range of motion. Neck supple. No JVD present.  Cardiovascular: Normal rate and regular rhythm.  No murmur  heard. Pulmonary/Chest: Effort normal and breath sounds normal. No respiratory distress. She has no rales. She exhibits no tenderness.  Abdominal: Soft. Bowel sounds are normal. She exhibits no distension. There is no tenderness.  Musculoskeletal: Normal range of motion. She exhibits no edema.  Lymphadenopathy:    She has no cervical adenopathy.  Neurological: She is alert and oriented to person, place, and time. No cranial nerve deficit. She exhibits normal muscle tone. Coordination normal.  Skin: Skin is warm and dry. No erythema.  Psychiatric: Affect and judgment normal.       SKIN: LABORATORY DATA: I have personally reviewed the data as listed: CBC    Component Value Date/Time   WBC 8.9 03/15/2018 0844   RBC 3.62 (L) 03/15/2018 0844   HGB 11.3 (L) 03/15/2018 0844   HCT 32.8 (L) 03/15/2018 0844   PLT 192 03/15/2018 0844   MCV 90.6 03/15/2018 0844   MCH 31.2 03/15/2018 0844   MCHC 34.5 03/15/2018 0844   RDW 12.7 03/15/2018 0844   LYMPHSABS 1.8 03/15/2018 0844   MONOABS 0.9 03/15/2018 0844   EOSABS 0.0 03/15/2018 0844   BASOSABS 0.0 03/15/2018 0844   CMP Latest Ref Rng & Units 03/15/2018 02/28/2018 11/09/2017  Glucose 70 - 99 mg/dL 83 - 88  BUN 8 - 23 mg/dL 17 - 10  Creatinine 0.44 - 1.00 mg/dL 0.66 0.70 0.54  Sodium 135 - 145 mmol/L 132(L) - 134(L)  Potassium 3.5 - 5.1 mmol/L 3.7 - 4.5  Chloride 98 - 111 mmol/L 100 - 103  CO2 22 - 32 mmol/L 25 - 23  Calcium 8.9 - 10.3 mg/dL 9.9 - 10.1  Total Protein 6.5 - 8.1 g/dL 7.7 - 7.5  Total Bilirubin 0.3 - 1.2 mg/dL 0.6 - 0.9  Alkaline Phos 38 - 126 U/L 51 - 52  AST 15 - 41 U/L 14(L) - 16  ALT 0 - 44 U/L 9 - 9    pathology 11/16/2016 Surgical Pathology  CASE: [id number]  PATIENT: Anael Mazzuca  Surgical Pathology Report   SPECIMEN SUBMITTED:  A. Retroperitoneal adenopathy, left  DIAGNOSIS:  A. LYMPH NODE, LEFT RETROPERITONEAL; CT-GUIDED CORE BIOPSY:  - METASTATIC HIGH-GRADE SEROUS CARCINOMA.   Comment:    RADIOGRAPHIC STUDIES: I have personally reviewed the radiological images as listed and agree with the findings in the report CT chest abdomen pelvis with contrast 11/13/2016 IMPRESSION: 1. Large heterogeneous central pelvic mass measures up to 12.6 cm. The lesion generates substantial mass-effect on the uterus, bladder, and pelvic sidewall anatomy. The mass appears to invade the mid sigmoid  colon. Etiology of the central pelvic mass is not definitive by CT, but ovarian primary is suspected. The lesion becomes indistinguishable from the posterior uterus on some images and uterine origin is possible, but the uterus does not appear to be the epicenter of the mass and appears to be more displaced by it. MRI of the pelvis without and with contrast may prove helpful to better delineate the relationship of the uterus to the central pelvic mass although it may not be able to definitively localize the origin. 2. Bulky retroperitoneal and left pelvic sidewall lymphadenopathy. The retroperitoneal lymphadenopathy generates substantial mass-effect on the IVC and left renal vein. The external iliac veins along each pelvic sidewall are markedly attenuated by the mass/lymphadenopathy. Patency of these vessels cannot be definitely confirmed on this exam.  Pathology 03/14/2017   DIAGNOSIS:  A. OMENTUM; OMENTECTOMY:  - NO TUMOR SEEN.  - ONE NEGATIVE LYMPH NODE (0/1).   B. RIGHT FALLOPIAN TUBE AND OVARY; SALPINGO-OOPHORECTOMY:  - SMALL FOCI OF HIGH GRADE SEROUS CARCINOMA INVOLVING THE OVARY.  - MARKED THERAPY RELATED CHANGE.  - NO TUMOR SEEN IN THE FALLOPIAN TUBE.   C. UTERUS, CERVIX, LEFT FALLOPIAN TUBE AND OVARY; HYSTERECTOMY AND LEFT  SALPINGO-OOPHORECTOMY:  - NABOTHIAN CYSTS.  - CYSTIC ATROPHY OF THE ENDOMETRIUM.  - SEROSAL ADHESIONS.  - UNREMARKABLE FALLOPIAN TUBE.  - OVARY SHOWING TREATMENT RELATED CHANGE.   D. 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   Results for HIAWATHA, DRESSEL" (MRN [id number]) as of 03/04/2018 16:52  Ref. Range 11/16/2016 08:47 12/25/2016 09:26 01/15/2017 09:05 02/05/2017 10:07 02/28/2017 13:35 03/28/2017 08:27 05/09/2017 08:35 07/04/2017 14:49 08/27/2017 09:45 11/09/2017 11:14 12/24/2017 11:25 02/18/2018 14:48  Hill Antigen (CA) 125 Latest Ref Range: 0.0 - 38.1 U/mL 4,382.0 (H) 699.1 (H) 207.6 (H) 64.6 (H) 36.9 31.9 7.4 11.3 14.1 21.0 25.7 46.3 (H)    RADIOGRAPHIC STUDIES: I have personally reviewed the radiological images as listed and agreed with the findings in the report. 02/28/2018 CT chest abdomen pelvis showed interval enlargement off and aorto caval lymph node which currently measures 1.4 cm in short axis.  This lymph node is in close proximity to the previously resected retroperitoneal lymphadenopathy and it was a concern for additional nodal metastasis.Small lung nodules stable.  ASSESSMENT/PLAN Hill Staging Malignant neoplasm of ovary [facility]) Staging form: Ovary, Fallopian Tube, and Primary Peritoneal Carcinoma, AJCC 8th Edition - Clinical stage from 11/22/2016: Stage IIIC (cT3c, cN1b, cM0) - Signed by Earlie Server, MD on 11/22/2016  1. Malignant neoplasm of ovary, unspecified laterality ([place])   2. Encounter for antineoplastic chemotherapy     # Ovarian Hill, local recurrence.  Status post neoadjuvant chemotherapy followed by debulking surgery followed by adjuvant chemotherapy. She has been in remission for more than 6 months. Local recurrence.  Labs are reviewed and discussed with patient.  Proceed with carboplatin and Taxol.  Patient has antiemetics at home.  Follow up in 3 weeks for next cycle of treatment.  Patient knows to call clinic if any symptoms or concerns.   Total face to face encounter time for this patient visit was 25 min. >50% of the time was  spent in counseling and coordination of care.   Earlie Server, MD, PhD Hematology Oncology [facility] Pager- [phone] 03/15/2018

## 2018-03-16 LAB — CA 125: Cancer Antigen (CA) 125: 49.7 U/mL — ABNORMAL HIGH (ref 0.0–38.1)

## 2018-03-27 ENCOUNTER — Ambulatory Visit: Payer: Medicare Other

## 2018-04-05 ENCOUNTER — Other Ambulatory Visit: Payer: Self-pay

## 2018-04-05 ENCOUNTER — Inpatient Hospital Stay: Payer: Medicare Other

## 2018-04-05 ENCOUNTER — Encounter: Payer: Self-pay | Admitting: Oncology

## 2018-04-05 ENCOUNTER — Inpatient Hospital Stay (HOSPITAL_BASED_OUTPATIENT_CLINIC_OR_DEPARTMENT_OTHER): Payer: Medicare Other | Admitting: Oncology

## 2018-04-05 VITALS — BP 108/71 | HR 66 | Temp 96.9°F | Resp 18 | Wt 123.4 lb

## 2018-04-05 DIAGNOSIS — C569 Malignant neoplasm of unspecified ovary: Secondary | ICD-10-CM | POA: Diagnosis not present

## 2018-04-05 DIAGNOSIS — D6481 Anemia due to antineoplastic chemotherapy: Secondary | ICD-10-CM

## 2018-04-05 DIAGNOSIS — D696 Thrombocytopenia, unspecified: Secondary | ICD-10-CM

## 2018-04-05 DIAGNOSIS — Z5111 Encounter for antineoplastic chemotherapy: Secondary | ICD-10-CM | POA: Diagnosis not present

## 2018-04-05 DIAGNOSIS — D6959 Other secondary thrombocytopenia: Secondary | ICD-10-CM

## 2018-04-05 DIAGNOSIS — T451X5A Adverse effect of antineoplastic and immunosuppressive drugs, initial encounter: Secondary | ICD-10-CM

## 2018-04-05 LAB — COMPREHENSIVE METABOLIC PANEL
ALT: 11 U/L (ref 0–44)
AST: 15 U/L (ref 15–41)
Albumin: 4.3 g/dL (ref 3.5–5.0)
Alkaline Phosphatase: 45 U/L (ref 38–126)
Anion gap: 9 (ref 5–15)
BUN: 14 mg/dL (ref 8–23)
CO2: 23 mmol/L (ref 22–32)
CREATININE: 0.75 mg/dL (ref 0.44–1.00)
Calcium: 10.1 mg/dL (ref 8.9–10.3)
Chloride: 105 mmol/L (ref 98–111)
Glucose, Bld: 87 mg/dL (ref 70–99)
Potassium: 3.6 mmol/L (ref 3.5–5.1)
Sodium: 137 mmol/L (ref 135–145)
Total Bilirubin: 0.5 mg/dL (ref 0.3–1.2)
Total Protein: 7.2 g/dL (ref 6.5–8.1)

## 2018-04-05 LAB — CBC WITH DIFFERENTIAL/PLATELET
Abs Immature Granulocytes: 0.05 10*3/uL (ref 0.00–0.07)
Basophils Absolute: 0 10*3/uL (ref 0.0–0.1)
Basophils Relative: 0 %
Eosinophils Absolute: 0 10*3/uL (ref 0.0–0.5)
Eosinophils Relative: 1 %
HCT: 31.8 % — ABNORMAL LOW (ref 36.0–46.0)
HEMOGLOBIN: 10.5 g/dL — AB (ref 12.0–15.0)
Immature Granulocytes: 1 %
Lymphocytes Relative: 34 %
Lymphs Abs: 1.8 10*3/uL (ref 0.7–4.0)
MCH: 30.2 pg (ref 26.0–34.0)
MCHC: 33 g/dL (ref 30.0–36.0)
MCV: 91.4 fL (ref 80.0–100.0)
Monocytes Absolute: 0.6 10*3/uL (ref 0.1–1.0)
Monocytes Relative: 11 %
NRBC: 0 % (ref 0.0–0.2)
Neutro Abs: 2.8 10*3/uL (ref 1.7–7.7)
Neutrophils Relative %: 53 %
Platelets: 129 10*3/uL — ABNORMAL LOW (ref 150–400)
RBC: 3.48 MIL/uL — ABNORMAL LOW (ref 3.87–5.11)
RDW: 13.8 % (ref 11.5–15.5)
WBC: 5.3 10*3/uL (ref 4.0–10.5)

## 2018-04-05 MED ORDER — FAMOTIDINE IN NACL 20-0.9 MG/50ML-% IV SOLN
20.0000 mg | Freq: Once | INTRAVENOUS | Status: AC
  Administered 2018-04-05: 20 mg via INTRAVENOUS
  Filled 2018-04-05: qty 50

## 2018-04-05 MED ORDER — HEPARIN SOD (PORK) LOCK FLUSH 100 UNIT/ML IV SOLN
500.0000 [IU] | Freq: Once | INTRAVENOUS | Status: AC
  Administered 2018-04-05: 500 [IU] via INTRAVENOUS
  Filled 2018-04-05: qty 5

## 2018-04-05 MED ORDER — SODIUM CHLORIDE 0.9 % IV SOLN
20.0000 mg | Freq: Once | INTRAVENOUS | Status: AC
  Administered 2018-04-05: 20 mg via INTRAVENOUS
  Filled 2018-04-05: qty 2

## 2018-04-05 MED ORDER — PALONOSETRON HCL INJECTION 0.25 MG/5ML
0.2500 mg | Freq: Once | INTRAVENOUS | Status: AC
  Administered 2018-04-05: 0.25 mg via INTRAVENOUS
  Filled 2018-04-05: qty 5

## 2018-04-05 MED ORDER — DIPHENHYDRAMINE HCL 50 MG/ML IJ SOLN
50.0000 mg | Freq: Once | INTRAMUSCULAR | Status: AC
  Administered 2018-04-05: 50 mg via INTRAVENOUS
  Filled 2018-04-05: qty 1

## 2018-04-05 MED ORDER — SODIUM CHLORIDE 0.9 % IV SOLN
Freq: Once | INTRAVENOUS | Status: AC
  Administered 2018-04-05: 10:00:00 via INTRAVENOUS
  Filled 2018-04-05: qty 250

## 2018-04-05 MED ORDER — SODIUM CHLORIDE 0.9 % IV SOLN
175.0000 mg/m2 | Freq: Once | INTRAVENOUS | Status: AC
  Administered 2018-04-05: 288 mg via INTRAVENOUS
  Filled 2018-04-05: qty 48

## 2018-04-05 MED ORDER — SODIUM CHLORIDE 0.9 % IV SOLN
368.5000 mg | Freq: Once | INTRAVENOUS | Status: AC
  Administered 2018-04-05: 370 mg via INTRAVENOUS
  Filled 2018-04-05: qty 37

## 2018-04-05 MED ORDER — SODIUM CHLORIDE 0.9% FLUSH
10.0000 mL | Freq: Once | INTRAVENOUS | Status: AC
  Administered 2018-04-05: 10 mL via INTRAVENOUS
  Filled 2018-04-05: qty 10

## 2018-04-05 NOTE — Progress Notes (Signed)
[facility] Follow up visit  Patient Care Team: Patient, [name], [name], [name], [name], MD as Referring Physician (Obstetrics and Gynecology)  CHIEF COMPLAINTS/PURPOSE OF Visit Follow up for Treatment of  ovarian cancer.  HISTORY OF PRESENTING ILLNESS: Katie Hill 67 y.o. female presents for follow up of management of stage IIIC ovarian cancer. She underwent  Neoadjuvant chemotherapy of carbo and taxol x 4  # Patient had debulking surgery on 03/14/2017. She had a laparoscopy with conversion to laparotomy, total hysterectomy, with bilateral salpingo oophorectomy, right aortic lymph node dissection, omentectomy. Pathology showed small foci of residual disease in ovary.   Genetic testing negative for83 genes on [facility]'s Multi-Cancer panel (ALK, APC, ATM, AXIN2, BAP1, BARD1, BLM, BMPR1A, BRCA1, BRCA2, BRIP1, CASR, CDC73, CDH1, CDK4, CDKN1B, CDKN1C, CDKN2A, CEBPA, CHEK2, CTNNA1, DICER1, DIS3L2, EGFR, EPCAM, FH, FLCN, GATA2, GPC3, GREM1, HOXB13, HRAS, KIT, MAX, MEN1, MET, MITF, MLH1, MSH2, MSH3, MSH6, MUTYH, NBN, NF1, NF2, NTHL1, PALB2, PDGFRA, PHOX2B, PMS2, POLD1, POLE, POT1, PRKAR1A, PTCH1, PTEN, RAD50, RAD51C, RAD51D, RB1, RECQL4, RET, RUNX1, SDHA, SDHAF2, SDHB, SDHC, SDHD, SMAD4, SMARCA4, SMARCB1, SMARCE1, STK11, SUFU, TERC, TERT, TMEM127, TP53, TSC1, TSC2, VHL, WRN, WT1).  A Variant of UncertainSignificancewas detected: CASRc.106G>A (p.Gly36Arg). Myraid testing negative for somatic BRACA1/2, positive for HRD.   # HRD positive, was referred to [facility] for clinical trials of Olarparib maintenance. She opted out.  #  Treatment:  Stage IIIC Ovarian cancer:  #s/p Carboplatin and taxol x 4 neoadjuvant chemotherapy followed by debulking surgery 03/14/2017 # 03/28/2017  S/p Adjuvant carbo and taxol x3   Local recurrence 03/15/2018 Carboplatin and Taxol   INTERVAL HISTORY 67 y.o. female with above oncology history reviewed by me today presents for evaluation prior to chemotherapy carbo and Taxol for the treatment of recurrence of ovarian cancer. Patient reports tolerating chemotherapy well.  Denies nausea.  No vomiting.  She does not need to use any antiemetics. She takes dexamethasone prior and after each cycles of chemotherapy.  Denies any development of skin rash. Reports not able to sleep well on the days that she takes dexamethasone.  Last night she slept 4 hours.  She uses Benadryl for her chronic sinusitis last night and able to sleep a little better. #Denies any numbness or tingling of fingertips.  Very occasionally she may has very mild tingling of great toes. Denies any chest pain, shortness of breath, abdominal pain, weight loss, nausea vomiting.   Review of Systems  Constitutional: Negative for appetite change, chills, fatigue and fever.  HENT:   Negative for hearing loss and voice change.   Eyes: Negative for eye problems.  Respiratory: Negative for chest tightness and cough.   Cardiovascular: Negative for chest pain.  Gastrointestinal: Negative for abdominal distention, abdominal pain and blood in stool.  Endocrine: Negative for hot flashes.  Genitourinary: Negative for difficulty urinating and frequency.   Musculoskeletal: Negative for arthralgias.  Skin: Negative for itching and rash.  Neurological: Negative for extremity weakness.  Hematological: Negative for adenopathy.  Psychiatric/Behavioral: Negative for confusion.   MEDICAL HISTORY: Past Medical History:  Diagnosis Date  . Dysrhythmia   . Genetic testing 03/28/2017   Multi-Cancer panel (83 genes) @ [facility] - No pathogenic mutations detected  . High grade ovarian cancer ([place]) 11/20/2016  . Pelvic mass in female     SURGICAL  HISTORY: Past Surgical History:  Procedure  Laterality Date  . APPENDECTOMY    . LAPAROSCOPY N/A 03/14/2017   Procedure: LAPAROSCOPY OPERATIVE;  Surgeon: Mellody Drown, MD;  Location: [facility];  Service: Gynecology;  Laterality: N/A;  . LAPAROTOMY N/A 03/14/2017   Procedure: LAPAROTOMY;  Surgeon: Mellody Drown, MD;  Location: [facility];  Service: Gynecology;  Laterality: N/A;  . LYMPH NODE DISSECTION N/A 03/14/2017   Procedure: LYMPH NODE DISSECTION;  Surgeon: Mellody Drown, MD;  Location: [facility];  Service: Gynecology;  Laterality: N/A;  . OMENTECTOMY N/A 03/14/2017   Procedure: OMENTECTOMY;  Surgeon: Mellody Drown, MD;  Location: [facility];  Service: Gynecology;  Laterality: N/A;  . PORTA CATH INSERTION N/A 11/27/2016   Procedure: Glori Luis Cath Insertion;  Surgeon: Algernon Huxley, MD;  Location: [place] [facility];  Service: Cardiovascular;  Laterality: N/A;    SOCIAL HISTORY: Social History   Socioeconomic History  . Marital status: Married    Spouse name: Not on file  . Number of children: Not on file  . Years of education: Not on file  . Highest education level: Not on file  Occupational History  . Not on file  Social Needs  . Financial resource strain: Not on file  . Food insecurity:    Worry: Not on file    Inability: Not on file  . Transportation needs:    Medical: Not on file    Non-medical: Not on file  Tobacco Use  . Smoking status: Never Smoker  . Smokeless tobacco: Never Used  Substance and Sexual Activity  . Alcohol use: No    Frequency: Never    Comment: use to be occ. but not any since 5 months  . Drug use: No  . Sexual activity: Yes    Birth control/protection: Post-menopausal  Lifestyle  . Physical activity:    Days per week: Not on file    Minutes per session: Not on file  . Stress: Not on file  Relationships  . Social connections:    Talks on phone: Not on file    Gets together: Not on file    Attends religious service: Not on file    Active member of club or organization: Not on file     Attends meetings of clubs or organizations: Not on file    Relationship status: Not on file  . Intimate partner violence:    Fear of current or ex partner: Not on file    Emotionally abused: Not on file    Physically abused: Not on file    Forced sexual activity: Not on file  Other Topics Concern  . Not on file  Social History Narrative  . Not on file    FAMILY HISTORY Family History  Problem Relation Age of Onset  . Throat cancer Cousin   . Throat cancer Cousin   . Leukemia Cousin     ALLERGIES:  is allergic to omeprazole.  MEDICATIONS:  Current Outpatient Medications  Medication Sig Dispense Refill  . calcium carbonate (TUMS - DOSED IN MG ELEMENTAL CALCIUM) 500 MG chewable tablet Chew 1 tablet by mouth as needed for indigestion or heartburn.    . dexamethasone (DECADRON) 4 MG tablet Take 2 tablets (8 mg total) by mouth daily. Take 2 days before and 2 days after chemotherapy 60 tablet 1  . lidocaine-prilocaine (EMLA) cream Apply 1 application topically as needed. Apply small amount to port site at least 1 hour prior to it being accessed, cover with plastic wrap  30 g 1  . lidocaine-prilocaine (EMLA) cream Apply to affected area once 30 g 3  . ondansetron (ZOFRAN) 8 MG tablet Take 1 tablet (8 mg total) by mouth 2 (two) times daily as needed for refractory nausea / vomiting. Start on day 3 after chemo. 30 tablet 1  . prochlorperazine (COMPAZINE) 10 MG tablet Take 1 tablet (10 mg total) by mouth every 6 (six) hours as needed (Nausea or vomiting). 30 tablet 1  . Tetrahydrozoline HCl (REDNESS RELIEVER EYE DROPS OP) Place 1 drop into both eyes as needed (for red eyes).      No current facility-administered medications for this visit.    Facility-Administered Medications Ordered in Other Visits  Medication Dose Route Frequency Provider Last Rate Last Dose  . heparin lock flush 100 unit/mL  500 Units Intravenous Once Earlie Server, MD      . sodium chloride flush (NS) 0.9 % injection 10  mL  10 mL Intravenous Once Earlie Server, MD        PHYSICAL EXAMINATION:  ECOG PERFORMANCE STATUS: 0 - Asymptomatic Vitals:   04/05/18 0847  BP: 108/71  Pulse: 66  Resp: 18  Temp: (!) 96.9 F (36.1 C)    Filed Weights   04/05/18 0847  Weight: 123 lb 6.4 oz (56 kg)     Physical Exam  Constitutional: She is oriented to person, place, and time. No distress.  HENT:  Head: Normocephalic and atraumatic.  Nose: Nose normal.  Mouth/Throat: Oropharynx is clear and moist. No oropharyngeal exudate.  Eyes: Pupils are equal, round, and reactive to light. EOM are normal. No scleral icterus.  Neck: Normal range of motion. Neck supple. No JVD present.  Cardiovascular: Normal rate and regular rhythm.  No murmur heard. Pulmonary/Chest: Effort normal and breath sounds normal. No respiratory distress. She has no rales. She exhibits no tenderness.  Abdominal: Soft. Bowel sounds are normal. She exhibits no distension. There is no abdominal tenderness.  Musculoskeletal: Normal range of motion.        General: No edema.  Lymphadenopathy:    She has no cervical adenopathy.  Neurological: She is alert and oriented to person, place, and time. No cranial nerve deficit. She exhibits normal muscle tone. Coordination normal.  Skin: Skin is warm and dry. She is not diaphoretic. No erythema.  Psychiatric: Affect and judgment normal.       SKIN: LABORATORY DATA: I have personally reviewed the data as listed: CBC    Component Value Date/Time   WBC 8.9 03/15/2018 0844   RBC 3.62 (L) 03/15/2018 0844   HGB 11.3 (L) 03/15/2018 0844   HCT 32.8 (L) 03/15/2018 0844   PLT 192 03/15/2018 0844   MCV 90.6 03/15/2018 0844   MCH 31.2 03/15/2018 0844   MCHC 34.5 03/15/2018 0844   RDW 12.7 03/15/2018 0844   LYMPHSABS 1.8 03/15/2018 0844   MONOABS 0.9 03/15/2018 0844   EOSABS 0.0 03/15/2018 0844   BASOSABS 0.0 03/15/2018 0844   CMP Latest Ref Rng & Units 03/15/2018 02/28/2018 11/09/2017  Glucose 70 - 99 mg/dL  83 - 88  BUN 8 - 23 mg/dL 17 - 10  Creatinine 0.44 - 1.00 mg/dL 0.66 0.70 0.54  Sodium 135 - 145 mmol/L 132(L) - 134(L)  Potassium 3.5 - 5.1 mmol/L 3.7 - 4.5  Chloride 98 - 111 mmol/L 100 - 103  CO2 22 - 32 mmol/L 25 - 23  Calcium 8.9 - 10.3 mg/dL 9.9 - 10.1  Total Protein 6.5 - 8.1 g/dL 7.7 - 7.5  Total Bilirubin 0.3 - 1.2 mg/dL 0.6 - 0.9  Alkaline Phos 38 - 126 U/L 51 - 52  AST 15 - 41 U/L 14(L) - 16  ALT 0 - 44 U/L 9 - 9    pathology 11/16/2016 Surgical Pathology  CASE: [id number]  PATIENT: Katie Hill  Surgical Pathology Report   SPECIMEN SUBMITTED:  A. Retroperitoneal adenopathy, left  DIAGNOSIS:  A. LYMPH NODE, LEFT RETROPERITONEAL; CT-GUIDED CORE BIOPSY:  - METASTATIC HIGH-GRADE SEROUS CARCINOMA.   Comment:   RADIOGRAPHIC STUDIES: I have personally reviewed the radiological images as listed and agree with the findings in the report CT chest abdomen pelvis with contrast 11/13/2016 IMPRESSION: 1. Large heterogeneous central pelvic mass measures up to 12.6 cm. The lesion generates substantial mass-effect on the uterus, bladder, and pelvic sidewall anatomy. The mass appears to invade the mid sigmoid colon. Etiology of the central pelvic mass is not definitive by CT, but ovarian primary is suspected. The lesion becomes indistinguishable from the posterior uterus on some images and uterine origin is possible, but the uterus does not appear to be the epicenter of the mass and appears to be more displaced by it. MRI of the pelvis without and with contrast may prove helpful to better delineate the relationship of the uterus to the central pelvic mass although it may not be able to definitively localize the origin. 2. Bulky retroperitoneal and left pelvic sidewall lymphadenopathy. The retroperitoneal lymphadenopathy generates substantial mass-effect on the IVC and left renal vein. The external iliac veins along each pelvic sidewall are markedly attenuated by the  mass/lymphadenopathy. Patency of these vessels cannot be definitely confirmed on this exam.  Pathology 03/14/2017   DIAGNOSIS:  A. OMENTUM; OMENTECTOMY:  - NO TUMOR SEEN.  - ONE NEGATIVE LYMPH NODE (0/1).   B. RIGHT FALLOPIAN TUBE AND OVARY; SALPINGO-OOPHORECTOMY:  - SMALL FOCI OF HIGH GRADE SEROUS CARCINOMA INVOLVING THE OVARY.  - MARKED THERAPY RELATED CHANGE.  - NO TUMOR SEEN IN THE FALLOPIAN TUBE.   C. UTERUS, CERVIX, LEFT FALLOPIAN TUBE AND OVARY; HYSTERECTOMY AND LEFT  SALPINGO-OOPHORECTOMY:  - NABOTHIAN CYSTS.  - CYSTIC ATROPHY OF THE ENDOMETRIUM.  - SEROSAL ADHESIONS.  - UNREMARKABLE FALLOPIAN TUBE.  - OVARY SHOWING TREATMENT RELATED CHANGE.   D. 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   Results for SHUNDRA, WIRSING" (MRN [id number]) as of 03/04/2018 16:52  Ref. Range 11/16/2016 08:47 12/25/2016 09:26 01/15/2017 09:05 02/05/2017 10:07 02/28/2017 13:35 03/28/2017 08:27 05/09/2017 08:35 07/04/2017 14:49 08/27/2017 09:45 11/09/2017 11:14 12/24/2017 11:25 02/18/2018 14:48  Cancer Antigen (CA) 125 Latest Ref Range: 0.0 - 38.1 U/mL 4,382.0 (H) 699.1 (H) 207.6 (H) 64.6 (H) 36.9 31.9 7.4 11.3 14.1 21.0 25.7 46.3 (H)    RADIOGRAPHIC STUDIES: I have personally reviewed the radiological images as listed and agreed with the findings in the report. 02/28/2018 CT chest abdomen pelvis showed interval enlargement off and aorto caval lymph node which currently measures 1.4 cm in short axis.  This lymph node is in close proximity to the previously resected retroperitoneal lymphadenopathy and it was a concern for additional nodal metastasis.Small lung nodules stable.  ASSESSMENT/PLAN Cancer Staging Malignant neoplasm of ovary [facility]) Staging form: Ovary, Fallopian Tube, and Primary Peritoneal Carcinoma, AJCC 8th Edition - Clinical stage from 11/22/2016: Stage IIIC (cT3c, cN1b, cM0) - Signed by Earlie Server, MD on  11/22/2016  1. Encounter for antineoplastic chemotherapy   2. High grade ovarian cancer ([place])   3. Anemia associated with chemotherapy  4. Thrombocytopenia ([place])     # Ovarian Cancer, local recurrence.  Status post neoadjuvant chemotherapy followed by debulking surgery followed by adjuvant chemotherapy.  Now with local recurrence. Tolerates chemotherapy with [place] and Taxol with minimal side effects. Labs reviewed and discussed with patient. Counts acceptable to proceed with cycle 2 carboplatin and Taxol. She takes dexamethasone prior and after each cycles of chemotherapy.  So far no development of skin rash. #Insomnia secondary to steroid use.  Advised patient to use over-the-counter melatonin.  Patient declines as she does not not have any problems sleeping on the days she is not taking dexamethasone.  She does not feel like to take any medication. #Normocytic anemia and thrombocytopenia secondary to chemotherapy.  Continue to monitor. Follow-up in 3 weeks for next cycle of treatment. Patient knows to call clinic if any symptoms or concerns. Total face to face encounter time for this patient visit was 25 min. >50% of the time was  spent in counseling and coordination of care.  Earlie Server, MD, PhD Hematology Oncology [facility] Pager- [phone] 04/05/2018

## 2018-04-05 NOTE — Progress Notes (Signed)
Patient here for follow up. No concerns voiced.  °

## 2018-04-06 LAB — CA 125: Cancer Antigen (CA) 125: 15.3 U/mL (ref 0.0–38.1)

## 2018-04-26 ENCOUNTER — Inpatient Hospital Stay: Payer: Medicare Other

## 2018-04-26 ENCOUNTER — Encounter: Payer: Self-pay | Admitting: Oncology

## 2018-04-26 ENCOUNTER — Ambulatory Visit: Payer: Medicare Other

## 2018-04-26 ENCOUNTER — Inpatient Hospital Stay (HOSPITAL_BASED_OUTPATIENT_CLINIC_OR_DEPARTMENT_OTHER): Payer: Medicare Other | Admitting: Oncology

## 2018-04-26 ENCOUNTER — Other Ambulatory Visit: Payer: Self-pay

## 2018-04-26 ENCOUNTER — Inpatient Hospital Stay: Payer: Medicare Other | Attending: Oncology

## 2018-04-26 VITALS — BP 112/71 | HR 68 | Temp 96.2°F | Resp 18 | Wt 122.9 lb

## 2018-04-26 VITALS — BP 101/60 | HR 68 | Resp 18

## 2018-04-26 DIAGNOSIS — D6959 Other secondary thrombocytopenia: Secondary | ICD-10-CM

## 2018-04-26 DIAGNOSIS — C569 Malignant neoplasm of unspecified ovary: Secondary | ICD-10-CM | POA: Insufficient documentation

## 2018-04-26 DIAGNOSIS — T451X5A Adverse effect of antineoplastic and immunosuppressive drugs, initial encounter: Secondary | ICD-10-CM

## 2018-04-26 DIAGNOSIS — Z5111 Encounter for antineoplastic chemotherapy: Secondary | ICD-10-CM | POA: Diagnosis not present

## 2018-04-26 DIAGNOSIS — D6481 Anemia due to antineoplastic chemotherapy: Secondary | ICD-10-CM | POA: Insufficient documentation

## 2018-04-26 DIAGNOSIS — G4700 Insomnia, unspecified: Secondary | ICD-10-CM | POA: Insufficient documentation

## 2018-04-26 DIAGNOSIS — Z95828 Presence of other vascular implants and grafts: Secondary | ICD-10-CM

## 2018-04-26 DIAGNOSIS — D696 Thrombocytopenia, unspecified: Secondary | ICD-10-CM

## 2018-04-26 LAB — COMPREHENSIVE METABOLIC PANEL
ALBUMIN: 4.3 g/dL (ref 3.5–5.0)
ALT: 9 U/L (ref 0–44)
AST: 16 U/L (ref 15–41)
Alkaline Phosphatase: 52 U/L (ref 38–126)
Anion gap: 7 (ref 5–15)
BUN: 16 mg/dL (ref 8–23)
CO2: 23 mmol/L (ref 22–32)
Calcium: 9.9 mg/dL (ref 8.9–10.3)
Chloride: 105 mmol/L (ref 98–111)
Creatinine, Ser: 0.62 mg/dL (ref 0.44–1.00)
GFR calc Af Amer: >60 mL/min (ref >60)
GFR calc non Af Amer: >60 mL/min (ref >60)
Glucose, Bld: 76 mg/dL (ref 70–99)
Potassium: 3.6 mmol/L (ref 3.5–5.1)
Sodium: 135 mmol/L (ref 135–145)
Total Bilirubin: 0.6 mg/dL (ref 0.3–1.2)
Total Protein: 7 g/dL (ref 6.5–8.1)

## 2018-04-26 LAB — CBC WITH DIFFERENTIAL/PLATELET
Abs Immature Granulocytes: 0.04 10*3/uL (ref 0.00–0.07)
BASOS ABS: 0 10*3/uL (ref 0.0–0.1)
Basophils Relative: 0 %
Eosinophils Absolute: 0 10*3/uL (ref 0.0–0.5)
Eosinophils Relative: 0 %
HCT: 30.4 % — ABNORMAL LOW (ref 36.0–46.0)
Hemoglobin: 10.2 g/dL — ABNORMAL LOW (ref 12.0–15.0)
IMMATURE GRANULOCYTES: 1 %
LYMPHS PCT: 33 %
Lymphs Abs: 1.7 10*3/uL (ref 0.7–4.0)
MCH: 31.4 pg (ref 26.0–34.0)
MCHC: 33.6 g/dL (ref 30.0–36.0)
MCV: 93.5 fL (ref 80.0–100.0)
Monocytes Absolute: 0.8 10*3/uL (ref 0.1–1.0)
Monocytes Relative: 14 %
NRBC: 0 % (ref 0.0–0.2)
Neutro Abs: 2.7 10*3/uL (ref 1.7–7.7)
Neutrophils Relative %: 52 %
Platelets: 127 10*3/uL — ABNORMAL LOW (ref 150–400)
RBC: 3.25 MIL/uL — ABNORMAL LOW (ref 3.87–5.11)
RDW: 15.2 % (ref 11.5–15.5)
WBC: 5.3 10*3/uL (ref 4.0–10.5)

## 2018-04-26 MED ORDER — SODIUM CHLORIDE 0.9 % IV SOLN
20.0000 mg | Freq: Once | INTRAVENOUS | Status: AC
  Administered 2018-04-26: 20 mg via INTRAVENOUS
  Filled 2018-04-26: qty 2

## 2018-04-26 MED ORDER — SODIUM CHLORIDE 0.9 % IV SOLN
175.0000 mg/m2 | Freq: Once | INTRAVENOUS | Status: AC
  Administered 2018-04-26: 288 mg via INTRAVENOUS
  Filled 2018-04-26: qty 48

## 2018-04-26 MED ORDER — HEPARIN SOD (PORK) LOCK FLUSH 100 UNIT/ML IV SOLN
500.0000 [IU] | Freq: Once | INTRAVENOUS | Status: AC | PRN
  Administered 2018-04-26: 500 [IU]
  Filled 2018-04-26: qty 5

## 2018-04-26 MED ORDER — SODIUM CHLORIDE 0.9 % IV SOLN
368.5000 mg | Freq: Once | INTRAVENOUS | Status: AC
  Administered 2018-04-26: 370 mg via INTRAVENOUS
  Filled 2018-04-26: qty 37

## 2018-04-26 MED ORDER — PALONOSETRON HCL INJECTION 0.25 MG/5ML
0.2500 mg | Freq: Once | INTRAVENOUS | Status: AC
  Administered 2018-04-26: 0.25 mg via INTRAVENOUS
  Filled 2018-04-26: qty 5

## 2018-04-26 MED ORDER — FAMOTIDINE IN NACL 20-0.9 MG/50ML-% IV SOLN
20.0000 mg | Freq: Once | INTRAVENOUS | Status: AC
  Administered 2018-04-26: 20 mg via INTRAVENOUS
  Filled 2018-04-26: qty 50

## 2018-04-26 MED ORDER — DIPHENHYDRAMINE HCL 50 MG/ML IJ SOLN
50.0000 mg | Freq: Once | INTRAMUSCULAR | Status: AC
  Administered 2018-04-26: 50 mg via INTRAVENOUS
  Filled 2018-04-26: qty 1

## 2018-04-26 MED ORDER — SODIUM CHLORIDE 0.9 % IV SOLN
Freq: Once | INTRAVENOUS | Status: AC
  Administered 2018-04-26: 09:00:00 via INTRAVENOUS
  Filled 2018-04-26: qty 250

## 2018-04-26 NOTE — Progress Notes (Signed)
Patient here for follow up. Pt has no concerns, states she feels "pretty good."

## 2018-04-26 NOTE — Progress Notes (Signed)
[place] [facility] Follow up visit  Patient Care Team: Patient, [name], [name], [name], [name], MD as Referring Physician (Obstetrics and Gynecology)  CHIEF COMPLAINTS/PURPOSE OF Visit Follow up for Treatment of  ovarian cancer.  HISTORY OF PRESENTING ILLNESS: Katie Hill 68 y.o. female presents for follow up of management of stage IIIC ovarian cancer. She underwent  Neoadjuvant chemotherapy of carbo and taxol x 4  # Patient had debulking surgery on 03/14/2017. She had a laparoscopy with conversion to laparotomy, total hysterectomy, with bilateral salpingo oophorectomy, right aortic lymph node dissection, omentectomy. Pathology showed small foci of residual disease in ovary.   Genetic testing negative for83 genes on [facility]'s Multi-Cancer panel (ALK, APC, ATM, AXIN2, BAP1, BARD1, BLM, BMPR1A, BRCA1, BRCA2, BRIP1, CASR, CDC73, CDH1, CDK4, CDKN1B, CDKN1C, CDKN2A, CEBPA, CHEK2, CTNNA1, DICER1, DIS3L2, EGFR, EPCAM, FH, FLCN, GATA2, GPC3, GREM1, HOXB13, HRAS, KIT, MAX, MEN1, MET, MITF, MLH1, MSH2, MSH3, MSH6, MUTYH, NBN, NF1, NF2, NTHL1, PALB2, PDGFRA, PHOX2B, PMS2, POLD1, POLE, POT1, PRKAR1A, PTCH1, PTEN, RAD50, RAD51C, RAD51D, RB1, RECQL4, RET, RUNX1, SDHA, SDHAF2, SDHB, SDHC, SDHD, SMAD4, SMARCA4, SMARCB1, SMARCE1, STK11, SUFU, TERC, TERT, TMEM127, TP53, TSC1, TSC2, VHL, WRN, WT1).  A Variant of UncertainSignificancewas detected: CASRc.106G>A (p.Gly36Arg). Myraid testing negative for somatic BRACA1/2, positive for HRD.   # HRD positive, was referred to [facility] for clinical trials of Olarparib maintenance. She opted out.  #  Treatment:  Stage IIIC Ovarian cancer:  #s/p Carboplatin and taxol x 4 neoadjuvant chemotherapy followed by debulking surgery 03/14/2017 # 03/28/2017  S/p Adjuvant carbo and taxol x3   Local recurrence 03/15/2018 Carboplatin and Taxol   INTERVAL HISTORY 68 y.o. female with above oncology history reviewed by me today presents for evaluation prior to chemotherapy carbo and Taxol for the treatment of recurrence of ovarian cancer. #Patient reports tolerating chemotherapy very well.  Denies any nausea, vomiting.  Does not use any antiemetics. She takes dexamethasone prior and after each cycles of chemotherapy.  Denies any development of skin rash Mentioned that she has some facial flash when she takes dexamethasone. She still have insomnia on the days she takes dexamethasone.  We discussed about starting melatonin and she never starts as she usually is able to sleep when she is not on dexamethasone and do not want to restart.  Denies any numbness or tingling of fingertips.  Very occasionally she has very mild tingling of great toes.  Not bothering her daily life. Denies any chest pain, shortness of breath, abdominal pain, weight loss, nausea or vomiting.    Review of Systems  Constitutional: Negative for appetite change, chills, fatigue and fever.  HENT:   Negative for hearing loss and voice change.   Eyes: Negative for eye problems.  Respiratory: Negative for chest tightness and cough.   Cardiovascular: Negative for chest pain.  Gastrointestinal: Negative for abdominal distention, abdominal pain and blood in stool.  Endocrine: Negative for hot flashes.  Genitourinary: Negative for bladder incontinence, difficulty urinating, frequency and hematuria.   Musculoskeletal: Negative for arthralgias.  Skin: Negative for itching and rash.  Neurological: Negative for extremity weakness.  Hematological: Negative for adenopathy.  Psychiatric/Behavioral: Negative for confusion.   MEDICAL HISTORY: Past Medical History:  Diagnosis Date  . Dysrhythmia   . Genetic testing 03/28/2017   Multi-Cancer panel (83 genes) @ [facility] - No pathogenic  mutations  detected  . High grade ovarian cancer ([place]) 11/20/2016  . Pelvic mass in female     SURGICAL HISTORY: Past Surgical History:  Procedure Laterality Date  . APPENDECTOMY    . LAPAROSCOPY N/A 03/14/2017   Procedure: LAPAROSCOPY OPERATIVE;  Surgeon: Mellody Drown, MD;  Location: [facility] OR[facility];  Service: Gynecology;  Laterality: N/A;  . LAPAROTOMY N/A 03/14/2017   Procedure: LAPAROTOMY;  Surgeon: Mellody Drown, MD;  Location: [facility] OR[facility];  Service: Gynecology;  Laterality: N/A;  . LYMPH NODE DISSECTION N/A 03/14/2017   Procedure: LYMPH NODE DISSECTION;  Surgeon: Mellody Drown, MD;  Location: [facility] OR[facility];  Service: Gynecology;  Laterality: N/A;  . OMENTECTOMY N/A 03/14/2017   Procedure: OMENTECTOMY;  Surgeon: Mellody Drown, MD;  Location: [facility] OR[facility];  Service: Gynecology;  Laterality: N/A;  . PORTA CATH INSERTION N/A 11/27/2016   Procedure: Glori Luis Cath Insertion;  Surgeon: Algernon Huxley, MD;  Location: [place] [facility];  Service: Cardiovascular;  Laterality: N/A;    SOCIAL HISTORY: Social History   Socioeconomic History  . Marital status: Married    Spouse name: Not on file  . Number of children: Not on file  . Years of education: Not on file  . Highest education level: Not on file  Occupational History  . Not on file  Social Needs  . Financial resource strain: Not on file  . Food insecurity:    Worry: Not on file    Inability: Not on file  . Transportation needs:    Medical: Not on file    Non-medical: Not on file  Tobacco Use  . Smoking status: Never Smoker  . Smokeless tobacco: Never Used  Substance and Sexual Activity  . Alcohol use: No    Frequency: Never    Comment: use to be occ. but not any since 5 months  . Drug use: No  . Sexual activity: Yes    Birth control/protection: Post-menopausal  Lifestyle  . Physical activity:    Days per week: Not on file    Minutes per session: Not on file  . Stress: Not on file  Relationships  . Social connections:    Talks on  phone: Not on file    Gets together: Not on file    Attends religious service: Not on file    Active member of club or organization: Not on file    Attends meetings of clubs or organizations: Not on file    Relationship status: Not on file  . Intimate partner violence:    Fear of current or ex partner: Not on file    Emotionally abused: Not on file    Physically abused: Not on file    Forced sexual activity: Not on file  Other Topics Concern  . Not on file  Social History Narrative  . Not on file    FAMILY HISTORY Family History  Problem Relation Age of Onset  . Throat cancer Cousin   . Throat cancer Cousin   . Leukemia Cousin     ALLERGIES:  is allergic to omeprazole.  MEDICATIONS:  Current Outpatient Medications  Medication Sig Dispense Refill  . calcium carbonate (TUMS - DOSED IN MG ELEMENTAL CALCIUM) 500 MG chewable tablet Chew 1 tablet by mouth as needed for indigestion or heartburn.    . dexamethasone (DECADRON) 4 MG tablet Take 2 tablets (8 mg total) by mouth daily. Take 2 days before and 2 days after chemotherapy 60 tablet 1  . lidocaine-prilocaine (EMLA) cream Apply 1 application topically  as needed. Apply small amount to port site at least 1 hour prior to it being accessed, cover with plastic wrap 30 g 1  . lidocaine-prilocaine (EMLA) cream Apply to affected area once 30 g 3  . ondansetron (ZOFRAN) 8 MG tablet Take 1 tablet (8 mg total) by mouth 2 (two) times daily as needed for refractory nausea / vomiting. Start on day 3 after chemo. 30 tablet 1  . prochlorperazine (COMPAZINE) 10 MG tablet Take 1 tablet (10 mg total) by mouth every 6 (six) hours as needed (Nausea or vomiting). 30 tablet 1  . Tetrahydrozoline HCl (REDNESS RELIEVER EYE DROPS OP) Place 1 drop into both eyes as needed (for red eyes).      No current facility-administered medications for this visit.     PHYSICAL EXAMINATION:  ECOG PERFORMANCE STATUS: 0 - Asymptomatic Vitals:   04/26/18 0845  BP:  112/71  Pulse: 68  Resp: 18  Temp: (!) 96.2 F (35.7 C)    Filed Weights   04/26/18 0845  Weight: 122 lb 14.4 oz (55.7 kg)     Physical Exam  Constitutional: She is oriented to person, place, and time. No distress.  HENT:  Head: Normocephalic and atraumatic.  Nose: Nose normal.  Mouth/Throat: Oropharynx is clear and moist. No oropharyngeal exudate.  Eyes: Pupils are equal, round, and reactive to light. EOM are normal. No scleral icterus.  Neck: Normal range of motion. Neck supple. No JVD present.  Cardiovascular: Normal rate and regular rhythm.  No murmur heard. Pulmonary/Chest: Effort normal and breath sounds normal. No respiratory distress. She has no rales. She exhibits no tenderness.  Abdominal: Soft. Bowel sounds are normal. She exhibits no distension. There is no abdominal tenderness.  Musculoskeletal: Normal range of motion.        General: No edema.  Lymphadenopathy:    She has no cervical adenopathy.  Neurological: She is alert and oriented to person, place, and time. No cranial nerve deficit. She exhibits normal muscle tone. Coordination normal.  Skin: Skin is warm and dry. She is not diaphoretic. No erythema.  Psychiatric: Affect and judgment normal.       SKIN: LABORATORY DATA: I have personally reviewed the data as listed: CBC    Component Value Date/Time   WBC 5.3 04/05/2018 0837   RBC 3.48 (L) 04/05/2018 0837   HGB 10.5 (L) 04/05/2018 0837   HCT 31.8 (L) 04/05/2018 0837   PLT 129 (L) 04/05/2018 0837   MCV 91.4 04/05/2018 0837   MCH 30.2 04/05/2018 0837   MCHC 33.0 04/05/2018 0837   RDW 13.8 04/05/2018 0837   LYMPHSABS 1.8 04/05/2018 0837   MONOABS 0.6 04/05/2018 0837   EOSABS 0.0 04/05/2018 0837   BASOSABS 0.0 04/05/2018 0837   CMP Latest Ref Rng & Units 04/05/2018 03/15/2018 02/28/2018  Glucose 70 - 99 mg/dL 87 83 -  BUN 8 - 23 mg/dL 14 17 -  Creatinine 0.44 - 1.00 mg/dL 0.75 0.66 0.70  Sodium 135 - 145 mmol/L 137 132(L) -  Potassium 3.5 - 5.1  mmol/L 3.6 3.7 -  Chloride 98 - 111 mmol/L 105 100 -  CO2 22 - 32 mmol/L 23 25 -  Calcium 8.9 - 10.3 mg/dL 10.1 9.9 -  Total Protein 6.5 - 8.1 g/dL 7.2 7.7 -  Total Bilirubin 0.3 - 1.2 mg/dL 0.5 0.6 -  Alkaline Phos 38 - 126 U/L 45 51 -  AST 15 - 41 U/L 15 14(L) -  ALT 0 - 44 U/L 11 9 -  pathology 11/16/2016 Surgical Pathology  CASE: [id number]  PATIENT: Breella Thumm  Surgical Pathology Report   SPECIMEN SUBMITTED:  A. Retroperitoneal adenopathy, left  DIAGNOSIS:  A. LYMPH NODE, LEFT RETROPERITONEAL; CT-GUIDED CORE BIOPSY:  - METASTATIC HIGH-GRADE SEROUS CARCINOMA.   Comment:   RADIOGRAPHIC STUDIES: I have personally reviewed the radiological images as listed and agree with the findings in the report CT chest abdomen pelvis with contrast 11/13/2016 IMPRESSION: 1. Large heterogeneous central pelvic mass measures up to 12.6 cm. The lesion generates substantial mass-effect on the uterus, bladder, and pelvic sidewall anatomy. The mass appears to invade the mid sigmoid colon. Etiology of the central pelvic mass is not definitive by CT, but ovarian primary is suspected. The lesion becomes indistinguishable from the posterior uterus on some images and uterine origin is possible, but the uterus does not appear to be the epicenter of the mass and appears to be more displaced by it. MRI of the pelvis without and with contrast may prove helpful to better delineate the relationship of the uterus to the central pelvic mass although it may not be able to definitively localize the origin. 2. Bulky retroperitoneal and left pelvic sidewall lymphadenopathy. The retroperitoneal lymphadenopathy generates substantial mass-effect on the IVC and left renal vein. The external iliac veins along each pelvic sidewall are markedly attenuated by the mass/lymphadenopathy. Patency of these vessels cannot be definitely confirmed on this exam.  Pathology 03/14/2017   DIAGNOSIS:  A. OMENTUM; OMENTECTOMY:   - NO TUMOR SEEN.  - ONE NEGATIVE LYMPH NODE (0/1).   B. RIGHT FALLOPIAN TUBE AND OVARY; SALPINGO-OOPHORECTOMY:  - SMALL FOCI OF HIGH GRADE SEROUS CARCINOMA INVOLVING THE OVARY.  - MARKED THERAPY RELATED CHANGE.  - NO TUMOR SEEN IN THE FALLOPIAN TUBE.   C. UTERUS, CERVIX, LEFT FALLOPIAN TUBE AND OVARY; HYSTERECTOMY AND LEFT  SALPINGO-OOPHORECTOMY:  - NABOTHIAN CYSTS.  - CYSTIC ATROPHY OF THE ENDOMETRIUM.  - SEROSAL ADHESIONS.  - UNREMARKABLE FALLOPIAN TUBE.  - OVARY SHOWING TREATMENT RELATED CHANGE.   D. 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   Results for MANVIR, THORSON" (MRN [id number]) as of 03/04/2018 16:52  Ref. Range 11/16/2016 08:47 12/25/2016 09:26 01/15/2017 09:05 02/05/2017 10:07 02/28/2017 13:35 03/28/2017 08:27 05/09/2017 08:35 07/04/2017 14:49 08/27/2017 09:45 11/09/2017 11:14 12/24/2017 11:25 02/18/2018 14:48  Cancer Antigen (CA) 125 Latest Ref Range: 0.0 - 38.1 U/mL 4,382.0 (H) 699.1 (H) 207.6 (H) 64.6 (H) 36.9 31.9 7.4 11.3 14.1 21.0 25.7 46.3 (H)    RADIOGRAPHIC STUDIES: I have personally reviewed the radiological images as listed and agreed with the findings in the report. 02/28/2018 CT chest abdomen pelvis showed interval enlargement off and aorto caval lymph node which currently measures 1.4 cm in short axis.  This lymph node is in close proximity to the previously resected retroperitoneal lymphadenopathy and it was a concern for additional nodal metastasis.Small lung nodules stable.  ASSESSMENT/PLAN Cancer Staging Malignant neoplasm of ovary [facility]) Staging form: Ovary, Fallopian Tube, and Primary Peritoneal Carcinoma, AJCC 8th Edition - Clinical stage from 11/22/2016: Stage IIIC (cT3c, cN1b, cM0) - Signed by Earlie Server, MD on 11/22/2016  1. Encounter for antineoplastic chemotherapy   2. High grade ovarian cancer ([place])   3. Thrombocytopenia ([place])   4. Anemia associated with  chemotherapy   5. Port-A-Cath in place     # Ovarian Cancer, local recurrence.  Status post neoadjuvant chemotherapy followed by debulking surgery followed by adjuvant chemotherapy.  Now with local recurrence. Tolerates chemotherapy  with [place] and Taxol with minimal side effects. Labs reviewed and discussed with patient. Counts are acceptable to proceed with cycle 3 carboplatin AUC 5 and Taxol. She uses dexamethasone prior and after each cycle of chemotherapy.  No recurrent episode of skin rash . #Insomnia, secondary to steroid use.  She prefers not to start melatonin.  But aware about option.  #Normocytic anemia and thrombocytopenia secondary to chemotherapy.  Counts acceptable to proceed with treatment.  Continue monitor.  Her CA125 has decreased to 2:15 cycles of carboplatin and Taxol.  Today's Ca1 25 trended down to 6.3. She has responded very well to chemotherapy.  Discussed with patient about switching to olaparib 360m BID as maintenance. Rationale of Olarparib and side effects including but not limited to peripheral edema, fatigue, nausea, vomiting, low blood counts, infection, etc, discussed with patient. Patient agrees with plan and willing to proceed.  Will start paperwork to obtain Olarparib.   Total face to face encounter time for this patient visit was 25 min. >50% of the time was  spent in counseling and coordination of care.   RTF in 3 weeks.   ZEarlie Server MD, PhD Hematology Oncology CNaugatuck Valley Endoscopy Center LLCat AMacomb Endoscopy Center PlcPager- [phone]/17/2020

## 2018-04-27 LAB — CA 125: Cancer Antigen (CA) 125: 6.3 U/mL (ref 0.0–38.1)

## 2018-04-29 ENCOUNTER — Telehealth: Payer: Self-pay | Admitting: Pharmacist

## 2018-04-29 ENCOUNTER — Telehealth: Payer: Self-pay | Admitting: Pharmacy Technician

## 2018-04-29 DIAGNOSIS — C569 Malignant neoplasm of unspecified ovary: Secondary | ICD-10-CM

## 2018-04-29 NOTE — Telephone Encounter (Signed)
Oral Oncology Patient Advocate Encounter  After completing a benefits investigation, prior authorization for Lonie Peak is not required at this time through [facility].  Patient's copay is $3000 (deductible).  We will apply for grant funding to cover the out of pocket cost.  [place] Patient [place] Phone [phone] Fax [phone] 04/29/2018 2:42 PM

## 2018-04-29 NOTE — Telephone Encounter (Signed)
Oral Oncology Patient Advocate Encounter  Was successful in securing patient a $3500 grant from [employer] to provide copayment coverage for [employer].  This will keep the out of pocket expense at $0.     [employer] ID: [id number]  I have spoken with the patient.    The billing information is as follows and has been shared with [place].    RxBin: [id number] PCN: [id number] Member ID: [id number] Group ID: [id number] Dates of Eligibility: 03/30/18 through 03/30/2019  [place] Patient [place] Phone [phone] Fax [phone] 04/29/2018 3:15 PM

## 2018-04-29 NOTE — Telephone Encounter (Signed)
Oral Oncology Patient Advocate Encounter   Was successful in securing patient an $80 grant from [facility] [place] [facility] to provide copayment coverage for Lynparza.  This will keep the out of pocket expense at $0.     I have spoken with the patient.    The billing information is as follows and has been shared with [place].   Member ID: [id number] Group ID: [id number] RxBin: [id number] Dates of Eligibility: 01/29/18 through 04/29/2019  [place] Patient [place] Phone [phone] Fax [phone] 04/29/2018 3:37 PM

## 2018-04-29 NOTE — Telephone Encounter (Signed)
Oral Oncology Pharmacist Encounter  Received new prescription for Lynparza (olaparib) for the maintenance therapy of recurrent ovarian cancer following her carboplatin and paclitaxel regimen, planned duration until disease progression or unacceptable drug toxicity.  CMP/CBC from 04/26/2018 assessed, no relevant lab abnormalities. She will have some time to recover following her infusion on 04/26/2018 before starting the olaparib. Prescription dose and frequency assessed.   Current medication list in Epic reviewed, no DDIs with olaparib identified.  Oral Oncology Clinic will continue to follow for insurance authorization, copayment issues, initial counseling and start date.  Darl Pikes, PharmD, BCPS, [facility] Hematology/Oncology Clinical Pharmacist [facility]/AP [facility] [phone]  04/29/2018 8:44 AM

## 2018-04-30 MED ORDER — OLAPARIB 150 MG PO TABS: 300.0000 mg | ORAL_TABLET | Freq: Two times a day (BID) | ORAL | 2 refills | Status: DC

## 2018-04-30 NOTE — Telephone Encounter (Signed)
Oral Chemotherapy Pharmacist Encounter   Patient would prefer to use [place] for her Lonie Peak. Prescription was sent to [facility]. Will continue to follow patient medication access.   Called patient and let her know not to start the [place] when she receives it. She should bring the medication with her to her next appt and she will be instructed when to begin at that time. She stated her understanding.  Darl Pikes, PharmD, BCPS, [facility] Hematology/Oncology Clinical Pharmacist [facility]/AP Oral [facility] [phone]  04/30/2018 11:49 AM

## 2018-05-02 NOTE — Telephone Encounter (Signed)
Oral Oncology Patient Advocate Encounter  Spoke with patients husband on 1/21 and they want to use [facility] [facility].  They were told that they could receive medication through this program at no out of pocket cost to them.  I explained that the grants we signed her up for could cover the $3000 deductible but patient was adamant that they wanted to use [employer] service instead.    Lynparza prescription was e-scribed to [facility] [employer].  Patient is aware that not using grants will result in the funds closing.  [place] Patient [place] Phone [phone] Fax [phone] 05/02/2018 9:25 AM

## 2018-05-10 NOTE — Telephone Encounter (Signed)
Spoke to patient today to confirm that she had received medication from [facility].  She stated that she received it 05/09/2018 and will bring it to her appointment on 05/17/2018.  [place] Patient [place] Phone [phone] Fax [phone] 05/10/2018 3:21 PM

## 2018-05-17 ENCOUNTER — Inpatient Hospital Stay: Payer: Medicare Other

## 2018-05-17 ENCOUNTER — Inpatient Hospital Stay: Payer: Medicare Other | Attending: Oncology

## 2018-05-17 ENCOUNTER — Other Ambulatory Visit: Payer: Self-pay

## 2018-05-17 ENCOUNTER — Inpatient Hospital Stay (HOSPITAL_BASED_OUTPATIENT_CLINIC_OR_DEPARTMENT_OTHER): Payer: Medicare Other | Admitting: Oncology

## 2018-05-17 ENCOUNTER — Encounter: Payer: Self-pay | Admitting: Oncology

## 2018-05-17 VITALS — BP 116/70 | HR 73 | Temp 96.3°F | Resp 18 | Wt 124.3 lb

## 2018-05-17 DIAGNOSIS — T451X5A Adverse effect of antineoplastic and immunosuppressive drugs, initial encounter: Secondary | ICD-10-CM

## 2018-05-17 DIAGNOSIS — D61810 Antineoplastic chemotherapy induced pancytopenia: Secondary | ICD-10-CM | POA: Diagnosis not present

## 2018-05-17 DIAGNOSIS — Z79899 Other long term (current) drug therapy: Secondary | ICD-10-CM | POA: Diagnosis not present

## 2018-05-17 DIAGNOSIS — Z9221 Personal history of antineoplastic chemotherapy: Secondary | ICD-10-CM | POA: Insufficient documentation

## 2018-05-17 DIAGNOSIS — E871 Hypo-osmolality and hyponatremia: Secondary | ICD-10-CM | POA: Insufficient documentation

## 2018-05-17 DIAGNOSIS — Z90710 Acquired absence of both cervix and uterus: Secondary | ICD-10-CM | POA: Insufficient documentation

## 2018-05-17 DIAGNOSIS — D696 Thrombocytopenia, unspecified: Secondary | ICD-10-CM

## 2018-05-17 DIAGNOSIS — R590 Localized enlarged lymph nodes: Secondary | ICD-10-CM | POA: Diagnosis not present

## 2018-05-17 DIAGNOSIS — C569 Malignant neoplasm of unspecified ovary: Secondary | ICD-10-CM | POA: Diagnosis not present

## 2018-05-17 DIAGNOSIS — Z95828 Presence of other vascular implants and grafts: Secondary | ICD-10-CM

## 2018-05-17 DIAGNOSIS — Z90722 Acquired absence of ovaries, bilateral: Secondary | ICD-10-CM | POA: Insufficient documentation

## 2018-05-17 DIAGNOSIS — D6481 Anemia due to antineoplastic chemotherapy: Secondary | ICD-10-CM

## 2018-05-17 DIAGNOSIS — Z5111 Encounter for antineoplastic chemotherapy: Secondary | ICD-10-CM

## 2018-05-17 DIAGNOSIS — Z452 Encounter for adjustment and management of vascular access device: Secondary | ICD-10-CM | POA: Diagnosis not present

## 2018-05-17 LAB — COMPREHENSIVE METABOLIC PANEL
ALT: 10 U/L (ref 0–44)
AST: 17 U/L (ref 15–41)
Albumin: 4.1 g/dL (ref 3.5–5.0)
Alkaline Phosphatase: 56 U/L (ref 38–126)
Anion gap: 4 — ABNORMAL LOW (ref 5–15)
BUN: 9 mg/dL (ref 8–23)
CO2: 23 mmol/L (ref 22–32)
Calcium: 9.2 mg/dL (ref 8.9–10.3)
Chloride: 106 mmol/L (ref 98–111)
Creatinine, Ser: 0.52 mg/dL (ref 0.44–1.00)
GFR calc Af Amer: >60 mL/min (ref >60)
Glucose, Bld: 96 mg/dL (ref 70–99)
Potassium: 3.6 mmol/L (ref 3.5–5.1)
Sodium: 133 mmol/L — ABNORMAL LOW (ref 135–145)
TOTAL PROTEIN: 6.7 g/dL (ref 6.5–8.1)
Total Bilirubin: 0.6 mg/dL (ref 0.3–1.2)

## 2018-05-17 LAB — CBC WITH DIFFERENTIAL/PLATELET
Abs Immature Granulocytes: 0.02 10*3/uL (ref 0.00–0.07)
Basophils Absolute: 0 10*3/uL (ref 0.0–0.1)
Basophils Relative: 0 %
Eosinophils Absolute: 0.1 10*3/uL (ref 0.0–0.5)
Eosinophils Relative: 3 %
HCT: 29.9 % — ABNORMAL LOW (ref 36.0–46.0)
Hemoglobin: 9.9 g/dL — ABNORMAL LOW (ref 12.0–15.0)
Immature Granulocytes: 1 %
Lymphocytes Relative: 40 %
Lymphs Abs: 1.1 10*3/uL (ref 0.7–4.0)
MCH: 31.6 pg (ref 26.0–34.0)
MCHC: 33.1 g/dL (ref 30.0–36.0)
MCV: 95.5 fL (ref 80.0–100.0)
Monocytes Absolute: 0.4 10*3/uL (ref 0.1–1.0)
Monocytes Relative: 14 %
NEUTROS PCT: 42 %
Neutro Abs: 1.1 10*3/uL — ABNORMAL LOW (ref 1.7–7.7)
Platelets: 113 10*3/uL — ABNORMAL LOW (ref 150–400)
RBC: 3.13 MIL/uL — ABNORMAL LOW (ref 3.87–5.11)
RDW: 15.9 % — ABNORMAL HIGH (ref 11.5–15.5)
WBC: 2.7 10*3/uL — ABNORMAL LOW (ref 4.0–10.5)
nRBC: 0 % (ref 0.0–0.2)

## 2018-05-17 MED ORDER — HEPARIN SOD (PORK) LOCK FLUSH 100 UNIT/ML IV SOLN
INTRAVENOUS | Status: AC
  Filled 2018-05-17: qty 5

## 2018-05-17 MED ORDER — HEPARIN SOD (PORK) LOCK FLUSH 100 UNIT/ML IV SOLN
500.0000 [IU] | Freq: Once | INTRAVENOUS | Status: AC
  Administered 2018-05-17: 500 [IU] via INTRAVENOUS

## 2018-05-17 MED ORDER — LOPERAMIDE HCL 2 MG PO CAPS: 2.0000 mg | ORAL_CAPSULE | ORAL | 1 refills | Status: DC

## 2018-05-17 NOTE — Progress Notes (Signed)
Patient here for follow up. No concerns voiced.  °

## 2018-05-17 NOTE — Progress Notes (Signed)
[facility] Follow up visit  Patient Care Team: Patient, [name], [name], [name], [name], MD as Referring Physician (Obstetrics and Gynecology)  CHIEF COMPLAINTS/PURPOSE OF Visit Follow up for Treatment of  ovarian cancer.  HISTORY OF PRESENTING ILLNESS: Katie Hill 68 y.o. female presents for follow up of management of stage IIIC ovarian cancer. She underwent  Neoadjuvant chemotherapy of carbo and taxol x 4  # Patient had debulking surgery on 03/14/2017. She had a laparoscopy with conversion to laparotomy, total hysterectomy, with bilateral salpingo oophorectomy, right aortic lymph node dissection, omentectomy. Pathology showed small foci of residual disease in ovary.   Genetic testing negative for83 genes on [facility]'s Multi-Cancer panel (ALK, APC, ATM, AXIN2, BAP1, BARD1, BLM, BMPR1A, BRCA1, BRCA2, BRIP1, CASR, CDC73, CDH1, CDK4, CDKN1B, CDKN1C, CDKN2A, CEBPA, CHEK2, CTNNA1, DICER1, DIS3L2, EGFR, EPCAM, FH, FLCN, GATA2, GPC3, GREM1, HOXB13, HRAS, KIT, MAX, MEN1, MET, MITF, MLH1, MSH2, MSH3, MSH6, MUTYH, NBN, NF1, NF2, NTHL1, PALB2, PDGFRA, PHOX2B, PMS2, POLD1, POLE, POT1, PRKAR1A, PTCH1, PTEN, RAD50, RAD51C, RAD51D, RB1, RECQL4, RET, RUNX1, SDHA, SDHAF2, SDHB, SDHC, SDHD, SMAD4, SMARCA4, SMARCB1, SMARCE1, STK11, SUFU, TERC, TERT, TMEM127, TP53, TSC1, TSC2, VHL, WRN, WT1).  A Variant of UncertainSignificancewas detected: CASRc.106G>A (p.Gly36Arg). Myraid testing negative for somatic BRACA1/2, positive for HRD.   # HRD positive, was referred to [facility] for clinical trials of Olarparib maintenance. She opted out.  #  Treatment:  Stage IIIC Ovarian cancer:  #s/p Carboplatin and taxol x 4 neoadjuvant chemotherapy followed by debulking surgery 03/14/2017 # 03/28/2017  S/p Adjuvant carbo and taxol x3   Local recurrence, CA125 one 46.3 03/15/2018 Carboplatin and Taxol   INTERVAL HISTORY 68 y.o. female with above oncology history reviewed by me today presents for evaluation prior to chemotherapy carbo and Taxol for the treatment of recurrence of ovarian cancer. She has tolerated 3 cycles of carbo and Taxol.  Denies any nausea, vomiting.  Denies any numbness or tingling CA125 has responded to treatment very well and trended down to 6. She noticed a skin lesion on her chest wall which has been there for months and recently she has used hydrocortisone and applied on the skin lesion.  Skin lesion color is more red.  No itchiness, discharge or pus Chronic fatigue, at baseline.  Manageable.   Review of Systems  Constitutional: Positive for fatigue. Negative for appetite change, chills and fever.  HENT:   Negative for hearing loss and voice change.   Eyes: Negative for eye problems.  Respiratory: Negative for chest tightness and cough.   Cardiovascular: Negative for chest pain.  Gastrointestinal: Negative for abdominal distention, abdominal pain and blood in stool.  Endocrine: Negative for hot flashes.  Genitourinary: Negative for bladder incontinence, difficulty urinating, frequency and hematuria.   Musculoskeletal: Negative for arthralgias.  Skin: Negative for itching and rash.  Neurological: Negative for extremity weakness.  Hematological: Negative for adenopathy.  Psychiatric/Behavioral: Negative for confusion.   MEDICAL HISTORY: Past Medical History:  Diagnosis Date  . Dysrhythmia   . Genetic testing 03/28/2017   Multi-Cancer panel (83 genes) @ [facility] - No pathogenic mutations detected  . High grade ovarian cancer ([place]) 11/20/2016  . Pelvic mass in female     SURGICAL HISTORY: Past Surgical History:  Procedure Laterality Date  . APPENDECTOMY    . LAPAROSCOPY N/A 03/14/2017  Procedure: LAPAROSCOPY OPERATIVE;  Surgeon: Mellody Drown, MD;   Location: [facility] OR[facility];  Service: Gynecology;  Laterality: N/A;  . LAPAROTOMY N/A 03/14/2017   Procedure: LAPAROTOMY;  Surgeon: Mellody Drown, MD;  Location: [facility] OR[facility];  Service: Gynecology;  Laterality: N/A;  . LYMPH NODE DISSECTION N/A 03/14/2017   Procedure: LYMPH NODE DISSECTION;  Surgeon: Mellody Drown, MD;  Location: [facility];  Service: Gynecology;  Laterality: N/A;  . OMENTECTOMY N/A 03/14/2017   Procedure: OMENTECTOMY;  Surgeon: Mellody Drown, MD;  Location: [facility];  Service: Gynecology;  Laterality: N/A;  . PORTA CATH INSERTION N/A 11/27/2016   Procedure: Glori Luis Cath Insertion;  Surgeon: Algernon Huxley, MD;  Location: [place] [facility];  Service: Cardiovascular;  Laterality: N/A;    SOCIAL HISTORY: Social History   Socioeconomic History  . Marital status: Married    Spouse name: Not on file  . Number of children: Not on file  . Years of education: Not on file  . Highest education level: Not on file  Occupational History  . Not on file  Social Needs  . Financial resource strain: Not on file  . Food insecurity:    Worry: Not on file    Inability: Not on file  . Transportation needs:    Medical: Not on file    Non-medical: Not on file  Tobacco Use  . Smoking status: Never Smoker  . Smokeless tobacco: Never Used  Substance and Sexual Activity  . Alcohol use: No    Frequency: Never    Comment: use to be occ. but not any since 5 months  . Drug use: No  . Sexual activity: Yes    Birth control/protection: Post-menopausal  Lifestyle  . Physical activity:    Days per week: Not on file    Minutes per session: Not on file  . Stress: Not on file  Relationships  . Social connections:    Talks on phone: Not on file    Gets together: Not on file    Attends religious service: Not on file    Active member of club or organization: Not on file    Attends meetings of clubs or organizations: Not on file    Relationship status: Not on file  . Intimate partner violence:     Fear of current or ex partner: Not on file    Emotionally abused: Not on file    Physically abused: Not on file    Forced sexual activity: Not on file  Other Topics Concern  . Not on file  Social History Narrative  . Not on file    FAMILY HISTORY Family History  Problem Relation Age of Onset  . Throat cancer Cousin   . Throat cancer Cousin   . Leukemia Cousin     ALLERGIES:  is allergic to omeprazole.  MEDICATIONS:  Current Outpatient Medications  Medication Sig Dispense Refill  . calcium carbonate (TUMS - DOSED IN MG ELEMENTAL CALCIUM) 500 MG chewable tablet Chew 1 tablet by mouth as needed for indigestion or heartburn.    . dexamethasone (DECADRON) 4 MG tablet Take 2 tablets (8 mg total) by mouth daily. Take 2 days before and 2 days after chemotherapy 60 tablet 1  . lidocaine-prilocaine (EMLA) cream Apply 1 application topically as needed. Apply small amount to port site at least 1 hour prior to it being accessed, cover with plastic wrap 30 g 1  . lidocaine-prilocaine (EMLA) cream Apply to affected area once 30 g 3  . olaparib (LYNPARZA)  150 MG tablet Take 2 tablets (300 mg total) by mouth 2 (two) times daily. 120 tablet 2  . ondansetron (ZOFRAN) 8 MG tablet Take 1 tablet (8 mg total) by mouth 2 (two) times daily as needed for refractory nausea / vomiting. Start on day 3 after chemo. 30 tablet 1  . prochlorperazine (COMPAZINE) 10 MG tablet Take 1 tablet (10 mg total) by mouth every 6 (six) hours as needed (Nausea or vomiting). 30 tablet 1  . Tetrahydrozoline HCl (REDNESS RELIEVER EYE DROPS OP) Place 1 drop into both eyes as needed (for red eyes).     [name] loperamide (IMODIUM) 2 MG capsule Take 1 capsule (2 mg total) by mouth See admin instructions. With onset of loose stool, take 91m followed by 289mevery 2 hours until 12 hours have passed without loose bowel movement. Maximum: 16 mg/day 120 capsule 1   No current facility-administered medications for this visit.     PHYSICAL  EXAMINATION:  ECOG PERFORMANCE STATUS: 0 - Asymptomatic Vitals:   05/17/18 0838  BP: 116/70  Pulse: 73  Resp: 18  Temp: (!) 96.3 F (35.7 C)    Filed Weights   05/17/18 0838  Weight: 124 lb 4.8 oz (56.4 kg)     Physical Exam  Constitutional: She is oriented to person, place, and time. No distress.  HENT:  Head: Normocephalic and atraumatic.  Nose: Nose normal.  Mouth/Throat: Oropharynx is clear and moist. No oropharyngeal exudate.  Eyes: Pupils are equal, round, and reactive to light. EOM are normal. No scleral icterus.  Neck: Normal range of motion. Neck supple. No JVD present.  Cardiovascular: Normal rate and regular rhythm.  No murmur heard. Pulmonary/Chest: Effort normal and breath sounds normal. No respiratory distress. She has no rales. She exhibits no tenderness.  Abdominal: Soft. Bowel sounds are normal. She exhibits no distension. There is no abdominal tenderness.  Musculoskeletal: Normal range of motion.        General: No edema.  Lymphadenopathy:    She has no cervical adenopathy.  Neurological: She is alert and oriented to person, place, and time. No cranial nerve deficit. She exhibits normal muscle tone. Coordination normal.  Skin: Skin is warm and dry. She is not diaphoretic. No erythema.  Right anterior medi port + 0.5cm raised erythematous skin lesion on anterior chest wall.   Psychiatric: Affect and judgment normal.       SKIN: LABORATORY DATA: I have personally reviewed the data as listed: CBC    Component Value Date/Time   WBC 2.7 (L) 05/17/2018 0813   RBC 3.13 (L) 05/17/2018 0813   HGB 9.9 (L) 05/17/2018 0813   HCT 29.9 (L) 05/17/2018 0813   PLT 113 (L) 05/17/2018 0813   MCV 95.5 05/17/2018 0813   MCH 31.6 05/17/2018 0813   MCHC 33.1 05/17/2018 0813   RDW 15.9 (H) 05/17/2018 0813   LYMPHSABS 1.1 05/17/2018 0813   MONOABS 0.4 05/17/2018 0813   EOSABS 0.1 05/17/2018 0813   BASOSABS 0.0 05/17/2018 0813   CMP Latest Ref Rng & Units 05/17/2018  04/26/2018 04/05/2018  Glucose 70 - 99 mg/dL 96 76 87  BUN 8 - 23 mg/dL _0 Creatinine 0.44 - 1.00 mg/dL 0.52 0.62 0.75  Sodium 135 - 145 mmol/L 133(L) 135 137  Potassium 3.5 - 5.1 mmol/L 3.6 3.6 3.6  Chloride 98 - 111 mmol/L 106 105 105  CO2 22 - 32 mmol/L _1 Calcium 8.9 - 10.3 mg/dL 9.2 9.9 10.1  Total Protein  6.5 - 8.1 g/dL 6.7 7.0 7.2  Total Bilirubin 0.3 - 1.2 mg/dL 0.6 0.6 0.5  Alkaline Phos 38 - 126 U/L 56 52 45  AST 15 - 41 U/L _0 ALT 0 - 44 U/L _1 pathology 11/16/2016 Surgical Pathology  CASE: [id number]  PATIENT: Boni Somes  Surgical Pathology Report   SPECIMEN SUBMITTED:  A. Retroperitoneal adenopathy, left  DIAGNOSIS:  A. LYMPH NODE, LEFT RETROPERITONEAL; CT-GUIDED CORE BIOPSY:  - METASTATIC HIGH-GRADE SEROUS CARCINOMA.   RADIOGRAPHIC STUDIES: I have personally reviewed the radiological images as listed and agree with the findings in the report CT chest abdomen pelvis with contrast 11/13/2016 IMPRESSION: 1. Large heterogeneous central pelvic mass measures up to 12.6 cm. The lesion generates substantial mass-effect on the uterus, bladder, and pelvic sidewall anatomy. The mass appears to invade the mid sigmoid colon. Etiology of the central pelvic mass is not definitive by CT, but ovarian primary is suspected. The lesion becomes indistinguishable from the posterior uterus on some images and uterine origin is possible, but the uterus does not appear to be the epicenter of the mass and appears to be more displaced by it. MRI of the pelvis without and with contrast may prove helpful to better delineate the relationship of the uterus to the central pelvic mass although it may not be able to definitively localize the origin. 2. Bulky retroperitoneal and left pelvic sidewall lymphadenopathy. The retroperitoneal lymphadenopathy generates substantial mass-effect on the IVC and left renal vein. The external iliac veins along each pelvic sidewall  are markedly attenuated by the mass/lymphadenopathy. Patency of these vessels cannot be definitely confirmed on this exam.  Pathology 03/14/2017   DIAGNOSIS:  A. OMENTUM; OMENTECTOMY:  - NO TUMOR SEEN.  - ONE NEGATIVE LYMPH NODE (0/1).   B. RIGHT FALLOPIAN TUBE AND OVARY; SALPINGO-OOPHORECTOMY:  - SMALL FOCI OF HIGH GRADE SEROUS CARCINOMA INVOLVING THE OVARY.  - MARKED THERAPY RELATED CHANGE.  - NO TUMOR SEEN IN THE FALLOPIAN TUBE.   C. UTERUS, CERVIX, LEFT FALLOPIAN TUBE AND OVARY; HYSTERECTOMY AND LEFT  SALPINGO-OOPHORECTOMY:  - NABOTHIAN CYSTS.  - CYSTIC ATROPHY OF THE ENDOMETRIUM.  - SEROSAL ADHESIONS.  - UNREMARKABLE FALLOPIAN TUBE.  - OVARY SHOWING TREATMENT RELATED CHANGE.   D. 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   Results for CARMINA, WALLE" (MRN [id number]) as of 03/04/2018 16:52  Ref. Range 11/16/2016 08:47 12/25/2016 09:26 01/15/2017 09:05 02/05/2017 10:07 02/28/2017 13:35 03/28/2017 08:27 05/09/2017 08:35 07/04/2017 14:49 08/27/2017 09:45 11/09/2017 11:14 12/24/2017 11:25 02/18/2018 14:48  Cancer Antigen (CA) 125 Latest Ref Range: 0.0 - 38.1 U/mL 4,382.0 (H) 699.1 (H) 207.6 (H) 64.6 (H) 36.9 31.9 7.4 11.3 14.1 21.0 25.7 46.3 (H)    RADIOGRAPHIC STUDIES: I have personally reviewed the radiological images as listed and agreed with the findings in the report. 02/28/2018 CT chest abdomen pelvis showed interval enlargement off and aorto caval lymph node which currently measures 1.4 cm in short axis.  This lymph node is in close proximity to the previously resected retroperitoneal lymphadenopathy and it was a concern for additional nodal metastasis.Small lung nodules stable.  ASSESSMENT/PLAN Cancer Staging Malignant neoplasm of ovary [facility]) Staging form: Ovary, Fallopian Tube, and Primary Peritoneal Carcinoma, AJCC 8th Edition - Clinical stage from 11/22/2016: Stage IIIC (cT3c, cN1b, cM0) -  Signed by Earlie Server, MD on 11/22/2016  1. Malignant neoplasm of ovary, unspecified laterality ([place])   2. Encounter for antineoplastic chemotherapy  3. Thrombocytopenia ([place])   4. Port-A-Cath in place   5. Anemia associated with chemotherapy   : # Ovarian Cancer, local recurrence.  Tolerated 3 cycles of carboplatin AUC 5 and Taxol.  CA 125 trended down to 6.  Today's CA125 pending. Switch to Olarparib 387m BID.  Rationale and side effects discussed with patient. side effects including but not limited to peripheral edema, fatigue, nausea, vomiting, low blood counts, infection, etc, discussed with patient. Patient agrees with plan and willing to proceed.   # Pancytopenia, chemotherapy induced. Close monitor.  Repeat CBC,CMP in 1 week  # Medi port needs to be flushed every 6 weeks.   RTF in 3 weeks.   ZEarlie Server MD, PhD Hematology Oncology CThe [facility]at AOutpatient CarecenterPager- [phone]/10/2018

## 2018-05-18 LAB — CA 125: Cancer Antigen (CA) 125: 6 U/mL (ref 0.0–38.1)

## 2018-05-20 ENCOUNTER — Telehealth: Payer: Self-pay | Admitting: Pharmacist

## 2018-05-20 NOTE — Telephone Encounter (Signed)
Oral Chemotherapy Pharmacist Encounter  Patient Education I spoke with patient for overview of new oral chemotherapy medication: Lynparza (olaparib) for maintenance therapy of recurrent ovarian cancer following her carboplatin and paclitaxel regimen, planned duration until disease progression or unacceptable drug toxicity.   Pt is doing well. Counseled patient on administration, dosing, side effects, monitoring, drug-food interactions, safe handling, storage, and disposal. Patient will take 2 tablets (300 mg total) by mouth 2 (two) times daily.  Side effects include but not limited to: N/V, decreased wbc/hgb/plt, fatigue, diarrhea.    Reviewed with patient importance of keeping a medication schedule and plan for any missed doses.  Ms. Riede voiced understanding and appreciation. All questions answered. Medication handout placed in the mail.  Provided patient with Oral [facility] phone number. Patient knows to call the office with questions or concerns. Oral Chemotherapy Navigation [facility] will continue to follow.  Darl Pikes, PharmD, BCPS, [facility] Hematology/Oncology Clinical Pharmacist [facility]/AP [facility] [phone]  05/20/2018 9:41 AM

## 2018-05-22 ENCOUNTER — Inpatient Hospital Stay (HOSPITAL_BASED_OUTPATIENT_CLINIC_OR_DEPARTMENT_OTHER): Payer: Medicare Other | Admitting: Obstetrics and Gynecology

## 2018-05-22 ENCOUNTER — Other Ambulatory Visit: Payer: Self-pay

## 2018-05-22 ENCOUNTER — Inpatient Hospital Stay: Payer: Medicare Other

## 2018-05-22 VITALS — BP 123/74 | HR 57 | Temp 96.8°F | Resp 18 | Wt 122.9 lb

## 2018-05-22 DIAGNOSIS — Z90710 Acquired absence of both cervix and uterus: Secondary | ICD-10-CM

## 2018-05-22 DIAGNOSIS — C569 Malignant neoplasm of unspecified ovary: Secondary | ICD-10-CM

## 2018-05-22 DIAGNOSIS — Z90722 Acquired absence of ovaries, bilateral: Secondary | ICD-10-CM

## 2018-05-22 DIAGNOSIS — Z9221 Personal history of antineoplastic chemotherapy: Secondary | ICD-10-CM

## 2018-05-22 DIAGNOSIS — Z79899 Other long term (current) drug therapy: Secondary | ICD-10-CM | POA: Diagnosis not present

## 2018-05-22 DIAGNOSIS — R590 Localized enlarged lymph nodes: Secondary | ICD-10-CM

## 2018-05-22 LAB — CBC WITH DIFFERENTIAL/PLATELET
Abs Immature Granulocytes: 0.02 10*3/uL (ref 0.00–0.07)
BASOS ABS: 0 10*3/uL (ref 0.0–0.1)
Basophils Relative: 1 %
EOS ABS: 0 10*3/uL (ref 0.0–0.5)
Eosinophils Relative: 1 %
HCT: 31.6 % — ABNORMAL LOW (ref 36.0–46.0)
Hemoglobin: 10.5 g/dL — ABNORMAL LOW (ref 12.0–15.0)
Immature Granulocytes: 1 %
Lymphocytes Relative: 30 %
Lymphs Abs: 0.9 10*3/uL (ref 0.7–4.0)
MCH: 31.9 pg (ref 26.0–34.0)
MCHC: 33.2 g/dL (ref 30.0–36.0)
MCV: 96 fL (ref 80.0–100.0)
Monocytes Absolute: 0.3 10*3/uL (ref 0.1–1.0)
Monocytes Relative: 10 %
Neutro Abs: 1.8 10*3/uL (ref 1.7–7.7)
Neutrophils Relative %: 57 %
Platelets: 120 10*3/uL — ABNORMAL LOW (ref 150–400)
RBC: 3.29 MIL/uL — ABNORMAL LOW (ref 3.87–5.11)
RDW: 15.4 % (ref 11.5–15.5)
WBC: 3.1 10*3/uL — ABNORMAL LOW (ref 4.0–10.5)
nRBC: 0 % (ref 0.0–0.2)

## 2018-05-22 LAB — COMPREHENSIVE METABOLIC PANEL
ALK PHOS: 60 U/L (ref 38–126)
ALT: 10 U/L (ref 0–44)
ANION GAP: 5 (ref 5–15)
AST: 16 U/L (ref 15–41)
Albumin: 4.4 g/dL (ref 3.5–5.0)
BUN: 12 mg/dL (ref 8–23)
CALCIUM: 9.6 mg/dL (ref 8.9–10.3)
CO2: 25 mmol/L (ref 22–32)
Chloride: 103 mmol/L (ref 98–111)
Creatinine, Ser: 0.68 mg/dL (ref 0.44–1.00)
GFR calc Af Amer: >60 mL/min (ref >60)
GFR calc non Af Amer: >60 mL/min (ref >60)
Glucose, Bld: 99 mg/dL (ref 70–99)
Potassium: 3.9 mmol/L (ref 3.5–5.1)
Sodium: 133 mmol/L — ABNORMAL LOW (ref 135–145)
Total Bilirubin: 0.7 mg/dL (ref 0.3–1.2)
Total Protein: 7.2 g/dL (ref 6.5–8.1)

## 2018-05-22 NOTE — Progress Notes (Signed)
Here for follow up. Overall stated " I feel pretty good " appetite decreased w taste sensations due to chemo med. Pt stated eating twice a day. No voiced c/o.

## 2018-05-22 NOTE — Progress Notes (Signed)
Gynecologic Oncology Interval Visit   Referring Provider: Dr. 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  Subjective:  Katie Hill is a 68 y.o. G0P0 female with advanced ovarian cancer s/p 4 cycles of neoadjuvant chemotherapy who presents for follow up.   02/28/2018- CT C/A/P  1. Compared to the prior examination there has been interval enlargement of an aortocaval lymph node which currently measures 1.4 cm in short axis (axial image 75 of series 2). This lymph node is in close proximity to the previously resected retroperitoneal lymphadenopathy and is very concerning for an additional nodal metastasis. 2. Other findings in the chest, abdomen and pelvis appear very similar to prior examinations, as discussed above. 3. Aortic atherosclerosis.  She received 3 cycles of carbo-Taxol 03/15/2018-04/26/2018. CA 125 improved to 6.0. She started olaparib 300 mg BID for platinum-sensitive recurrent ovarian cancer.   Today, she reports overall feeling well. She has some fatigue. She complains of abdominal bloating (epigastric) intermittently, decreased appetite, and intermittent nausea.   CA125 04/05/2018    15.3 04/26/2018       6.3 05/17/2018         6.0  Oncology History: Katie Hill is a pleasant. G0P0 female with advanced ovarian cancer.  She presented to the ER 11/13/2016 with symptoms of right lower quadrant pain for 3-4 weeks, associated with nausea, bloating, no change in weight, urinary frequency, and change in bowel habits with more diffucult and stringy BM's.  CT CHEST, ABDOMEN, AND PELVIS WITH CONTRAST IMPRESSION: 1. Large heterogeneous central pelvic mass measures up to 12.6 cm. The lesion generates substantial mass-effect on the uterus, bladder, and pelvic sidewall anatomy. The mass appears to invade the mid sigmoid colon. Etiology of the central pelvic mass is not definitive by CT, but ovarian primary is suspected. The lesion becomes  indistinguishable from the posterior uterus on some images and uterine origin is possible, but the uterus does not appear to be the epicenter of the mass and appears to be more displaced by it. MRI of the pelvis without and with contrast may prove helpful to better delineate the relationship of the uterus to the central pelvic mass although it may not be able to definitively localize the origin. 2. Bulky retroperitoneal and left pelvic sidewall lymphadenopathy. The retroperitoneal lymphadenopathy generates substantial mass-effect on the IVC and left renal vein. The external iliac veins along each pelvic sidewall are markedly attenuated by the mass/lymphadenopathy. Patency of these vessels cannot be definitely confirmed on this exam.  11/16/16 LYMPH NODE, LEFT RETROPERITONEAL; CT-GUIDED CORE BIOPSY:  - METASTATIC HIGH-GRADE SEROUS CARCINOMA.   Decision was made to do neoadjuvant carbo/taxol chemotherapy.  CA125 fell from 4,382 to 64.6 (10/29).  CT scan showed dramatic response.  CT scan 10/14 Vascular/Lymphatic: Normal appearance of the abdominal aorta. Interval decrease in size of previously identified bulky retroperitoneal adenopathy. Index aortocaval node measures 1.6 x 2.6 cm, image 32 of series 2. Previously 4.4 x 5.3 cm. Index left periaortic nodal mass measures 2.2 x 1.3 cm, image 31 of series 2. Previously 3.7 x 4.7 cm. At the level of the bifurcation there is a low-attenuation nodal mass which measure 3.1 x 2.6 cm, image 44 of series 2. Previously 4.3 x 4.8 cm. Left common iliac node measures 1.2 cm, image 53 of series 2. Previously 2.2 cm. Left external iliac node measures 1.6 x 1.1 cm, image 67 of series 2. Previously 4.8 x 2.6 cm. Reproductive: Large mass centered around the uterus  measures 9.4 x 5.5 cm, image 67 of series 2. Previously this measured 12.6 x 9.2 cm  On 03/14/2017 she underwent L/S converted to XL, TAH, BSO, right PA node resection, omentectomy, and LOA to no residual disease.    Pathology 03/14/2017  DIAGNOSIS:  A. OMENTUM; OMENTECTOMY:  - NO TUMOR SEEN.  - ONE NEGATIVE LYMPH NODE (0/1).   B. RIGHT FALLOPIAN TUBE AND OVARY; SALPINGO-OOPHORECTOMY:  - SMALL FOCI OF HIGH GRADE SEROUS CARCINOMA INVOLVING THE OVARY.  - MARKED THERAPY RELATED CHANGE.  - NO TUMOR SEEN IN THE FALLOPIAN TUBE.   C. UTERUS, CERVIX, LEFT FALLOPIAN TUBE AND OVARY; HYSTERECTOMY AND LEFT  SALPINGO-OOPHORECTOMY:  - NABOTHIAN CYSTS.  - CYSTIC ATROPHY OF THE ENDOMETRIUM.  - SEROSAL ADHESIONS.  - UNREMARKABLE FALLOPIAN TUBE.  - OVARY SHOWING TREATMENT RELATED CHANGE.   D. 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   Then completed an additional 3 cycles of carbo/Taxol and CA 125 was 7.4 05/09/17.  CA 125  14 in 5/19  21 in 8/19.   CT scan 11/22/17 IMPRESSION: 1. Interval debulking surgery with decrease in retroperitoneal and extraperitoneal lymphadenopathy. In some areas the lymphadenopathy has resolved completely. Peritoneal implant seen along the falciform ligament on the prior study has decreased. 2. 4 x 2 cm collection of complex fluid or soft tissue in the right cul-de-sac is in the region of the complex mass lesion seen on the previous study. Given that there is some apparent enhancement peripherally, residual/recurrent disease is a concern and close attention will be required. 3. Stable tiny bilateral pulmonary nodules with no pleural effusion.  CA125  25.7 12/24/17 46.3 02/18/18     Genetic testing negative for 83 genes on [facility]'s Multi-Cancer panel (ALK, APC, ATM, AXIN2, BAP1, BARD1, BLM, BMPR1A, BRCA1, BRCA2, BRIP1, CASR, CDC73, CDH1, CDK4, CDKN1B, CDKN1C, CDKN2A, CEBPA, CHEK2, CTNNA1, DICER1, DIS3L2, EGFR, EPCAM, FH, FLCN, GATA2, GPC3, GREM1, HOXB13, HRAS, KIT, MAX, MEN1, MET, MITF, MLH1, MSH2, MSH3, MSH6, MUTYH, NBN, NF1, NF2, NTHL1, PALB2, PDGFRA, PHOX2B, PMS2, POLD1, POLE, POT1, PRKAR1A, PTCH1,  PTEN, RAD50, RAD51C, RAD51D, RB1, RECQL4, RET, RUNX1, SDHA, SDHAF2, SDHB, SDHC, SDHD, SMAD4, SMARCA4, SMARCB1, SMARCE1, STK11, SUFU, TERC, TERT, TMEM127, TP53, TSC1, TSC2, VHL, WRN, WT1).  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 This is still considered a normal result.   Somatic tumor testing:  She has HRD positive cancer, negative for somatic or germline BRCA mutation.   Patient Active Problem List   Diagnosis Date Noted  . Goals of care, counseling/discussion 03/09/2018  . Genetic testing 03/28/2017  . S/P total abdominal hysterectomy and bilateral salpingo-oophorectomy 03/14/2017  . Hyponatremia 12/12/2016  . Dehydration 12/08/2016  . Malignant neoplasm of ovary ([place]) 11/20/2016  . Pelvic mass 11/15/2016   Past Medical History:  Diagnosis Date  . Dysrhythmia   . Genetic testing 03/28/2017   Multi-Cancer panel (83 genes) @ [facility] - No pathogenic mutations detected  . High grade ovarian cancer ([place]) 11/20/2016  . Pelvic mass in female    Past Surgical History:  Procedure Laterality Date  . APPENDECTOMY    . LAPAROSCOPY N/A 03/14/2017   Procedure: LAPAROSCOPY OPERATIVE;  Surgeon: Mellody Drown, MD;  Location: [facility];  Service: Gynecology;  Laterality: N/A;  . LAPAROTOMY N/A 03/14/2017   Procedure: LAPAROTOMY;  Surgeon: Mellody Drown, MD;  Location: [facility];  Service: Gynecology;  Laterality: N/A;  . LYMPH NODE DISSECTION N/A 03/14/2017   Procedure: LYMPH NODE DISSECTION;  Surgeon: Fransisca Connors,  Mitzi Hansen, MD;  Location: [facility];  Service: Gynecology;  Laterality: N/A;  . OMENTECTOMY N/A 03/14/2017   Procedure: OMENTECTOMY;  Surgeon: Mellody Drown, MD;  Location: [facility];  Service: Gynecology;  Laterality: N/A;  . PORTA CATH INSERTION N/A 11/27/2016   Procedure: Glori Luis Cath Insertion;  Surgeon: Algernon Huxley, MD;  Location: [facility];  Service: Cardiovascular;  Laterality: N/A;   Past Gynecologic History:  Menarche: 17 Last  Menstrual Period: 24 years ago History of Abnormal pap: no Last pap: years ago She does not have regular medical cancer and has not has screening mammogram, colonoscopy, or Pap smears. She has not had an abnormal Pap.   OB History    Gravida  0   Para  0   Term  0   Preterm  0   AB  0   Living  0     SAB  0   TAB  0   Ectopic  0   Multiple  0   Live Births  0          Family History  Problem Relation Age of Onset  . Throat cancer Cousin   . Throat cancer Cousin   . Leukemia Cousin    Social History   Socioeconomic History  . Marital status: Married    Spouse name: Not on file  . Number of children: Not on file  . Years of education: Not on file  . Highest education level: Not on file  Occupational History  . Not on file  Social Needs  . Financial resource strain: Not on file  . Food insecurity:    Worry: Not on file    Inability: Not on file  . Transportation needs:    Medical: Not on file    Non-medical: Not on file  Tobacco Use  . Smoking status: Never Smoker  . Smokeless tobacco: Never Used  Substance and Sexual Activity  . Alcohol use: No    Frequency: Never    Comment: use to be occ. but not any since 5 months  . Drug use: No  . Sexual activity: Yes    Birth control/protection: Post-menopausal  Lifestyle  . Physical activity:    Days per week: Not on file    Minutes per session: Not on file  . Stress: Not on file  Relationships  . Social connections:    Talks on phone: Not on file    Gets together: Not on file    Attends religious service: Not on file    Active member of club or organization: Not on file    Attends meetings of clubs or organizations: Not on file    Relationship status: Not on file  Other Topics Concern  . Not on file  Social History Narrative  . Not on file   Allergies  Allergen Reactions  . Omeprazole Rash   Current Outpatient Medications on File Prior to Visit  Medication Sig Dispense Refill  . olaparib  (LYNPARZA) 150 MG tablet Take 2 tablets (300 mg total) by mouth 2 (two) times daily. 120 tablet 2  . calcium carbonate (TUMS - DOSED IN MG ELEMENTAL CALCIUM) 500 MG chewable tablet Chew 1 tablet by mouth as needed for indigestion or heartburn.    . dexamethasone (DECADRON) 4 MG tablet Take 2 tablets (8 mg total) by mouth daily. Take 2 days before and 2 days after chemotherapy (Patient not taking: Reported on 05/22/2018) 60 tablet 1  . lidocaine-prilocaine (EMLA) cream Apply  1 application topically as needed. Apply small amount to port site at least 1 hour prior to it being accessed, cover with plastic wrap (Patient not taking: Reported on 05/22/2018) 30 g 1  . loperamide (IMODIUM) 2 MG capsule Take 1 capsule (2 mg total) by mouth See admin instructions. With onset of loose stool, take '4mg'$  followed by '2mg'$  every 2 hours until 12 hours have passed without loose bowel movement. Maximum: 16 mg/day (Patient not taking: Reported on 05/22/2018) 120 capsule 1  . ondansetron (ZOFRAN) 8 MG tablet Take 1 tablet (8 mg total) by mouth 2 (two) times daily as needed for refractory nausea / vomiting. Start on day 3 after chemo. (Patient not taking: Reported on 05/22/2018) 30 tablet 1  . prochlorperazine (COMPAZINE) 10 MG tablet Take 1 tablet (10 mg total) by mouth every 6 (six) hours as needed (Nausea or vomiting). (Patient not taking: Reported on 05/22/2018) 30 tablet 1  . Tetrahydrozoline HCl (REDNESS RELIEVER EYE DROPS OP) Place 1 drop into both eyes as needed (for red eyes).      No current facility-administered medications on file prior to visit.     Review of Systems General:  no complaints Skin: no complaints Eyes: no complaints HEENT: no complaints Breasts: no complaints Pulmonary: no complaints Cardiac: no complaints Gastrointestinal: per hpi Genitourinary/Sexual: no complaints Ob/Gyn: no complaints Musculoskeletal: no complaints Hematology: no complaints Neurologic/Psych: no complaints   Objective:   Physical Examination:  Today's Vitals   05/22/18 1527  BP: 123/74  Pulse: (!) 57  Resp: 18  Temp: (!) 96.8 F (36 C)  Weight: 122 lb 14.4 oz (55.7 kg)  PainSc: 0-No pain   Body mass index is 18.69 kg/m.  ECOG Performance Status: 1 - Symptomatic but completely ambulatory  GENERAL: thin built, female. Alert. No acute distress. Accompanied by husband.  HEENT:  Sclera clear. Anicteric. Alopecia.  NODES:  Negative axillary, supraclavicular, inguinal lymph node survery LUNGS:  Clear to auscultation bilaterally.   HEART:  Regular rate and rhythm.  ABDOMEN:  Soft, nontender.  No hernias, incisions well healed. No masses or ascites EXTREMITIES:  No peripheral edema. Atraumatic.  SKIN:  Clear with no obvious rashes or skin changes.  NEURO:  Nonfocal. Well oriented.  Appropriate affect.  Pelvic: exam chaperoned by nurse;  Vulva: normal appearing vulva with no masses, tenderness or lesions; Vagina: normal vagina, cuff healed. Adnexa/Uterus/Cervix: surgically absent; BME negative for masses or nodularity. Rectal: confirmatory.  Assessment:  Katie Hill is a 68 y.o. female diagnosed with high grade serous ovarian cancer (HRD) on CT directed node biopsy with partial bowel obstruction and extensive bulky adenopathy 8/18. s/p 4 cycles of carbo/taxol chemotherapy with dramatic decline in CA125 and improvement on CT scan. Interval debulking surgery to no gross residual on 03/14/2017.  Reinitiation of chemotherapy on 03/28/2017 with normalization of CA125.  Platinum-sensitive recurrence with normalization with re-induction of carbo-Taxol 03/15/2018-04/26/2018. Currently olaparib.   She does not have a germline mutation in BRCA1/2 or other related gene, but somatic tumor testing is positive for HRD.     Medical co-morbidities complicating care: prior abdominal surgery.  Plan:   Problem List Items Addressed This Visit      Genitourinary   Malignant neoplasm of ovary ([place]) - Primary    Continue to follow closely. She will see Dr. Tasia Catchings for monthly evaluation on olaparib. She is currently doing well other than mild GI complaints that she will review with Dr. Collie Siad team. I did discuss with her timing of  meals with olaparib and use of anti-emetics.   A total of 20 minutes were spent with the patient/family today; >50% was spent in education, counseling and coordination of care for ovarian cancer.  Jabbar Palmero Gaetana Michaelis, MD   CC:  Gae Dry,  [address] [place], [place] [phone]

## 2018-06-07 ENCOUNTER — Inpatient Hospital Stay: Payer: Medicare Other

## 2018-06-07 ENCOUNTER — Inpatient Hospital Stay (HOSPITAL_BASED_OUTPATIENT_CLINIC_OR_DEPARTMENT_OTHER): Payer: Medicare Other | Admitting: Oncology

## 2018-06-07 ENCOUNTER — Other Ambulatory Visit: Payer: Self-pay

## 2018-06-07 ENCOUNTER — Encounter: Payer: Self-pay | Admitting: Oncology

## 2018-06-07 VITALS — BP 96/59 | HR 75 | Temp 97.5°F | Resp 18 | Wt 123.9 lb

## 2018-06-07 DIAGNOSIS — Z9221 Personal history of antineoplastic chemotherapy: Secondary | ICD-10-CM | POA: Diagnosis not present

## 2018-06-07 DIAGNOSIS — C569 Malignant neoplasm of unspecified ovary: Secondary | ICD-10-CM | POA: Diagnosis not present

## 2018-06-07 DIAGNOSIS — T451X5A Adverse effect of antineoplastic and immunosuppressive drugs, initial encounter: Secondary | ICD-10-CM

## 2018-06-07 DIAGNOSIS — Z95828 Presence of other vascular implants and grafts: Secondary | ICD-10-CM

## 2018-06-07 DIAGNOSIS — R590 Localized enlarged lymph nodes: Secondary | ICD-10-CM | POA: Diagnosis not present

## 2018-06-07 DIAGNOSIS — D61810 Antineoplastic chemotherapy induced pancytopenia: Secondary | ICD-10-CM | POA: Diagnosis not present

## 2018-06-07 DIAGNOSIS — D6481 Anemia due to antineoplastic chemotherapy: Secondary | ICD-10-CM

## 2018-06-07 DIAGNOSIS — Z79899 Other long term (current) drug therapy: Secondary | ICD-10-CM | POA: Diagnosis not present

## 2018-06-07 DIAGNOSIS — E871 Hypo-osmolality and hyponatremia: Secondary | ICD-10-CM | POA: Diagnosis not present

## 2018-06-07 DIAGNOSIS — Z5111 Encounter for antineoplastic chemotherapy: Secondary | ICD-10-CM

## 2018-06-07 DIAGNOSIS — Z90722 Acquired absence of ovaries, bilateral: Secondary | ICD-10-CM | POA: Diagnosis not present

## 2018-06-07 DIAGNOSIS — Z90710 Acquired absence of both cervix and uterus: Secondary | ICD-10-CM | POA: Diagnosis not present

## 2018-06-07 LAB — COMPREHENSIVE METABOLIC PANEL
ALT: 9 U/L (ref 0–44)
ANION GAP: 6 (ref 5–15)
AST: 18 U/L (ref 15–41)
Albumin: 4.4 g/dL (ref 3.5–5.0)
Alkaline Phosphatase: 59 U/L (ref 38–126)
BUN: 12 mg/dL (ref 8–23)
CO2: 24 mmol/L (ref 22–32)
Calcium: 9.6 mg/dL (ref 8.9–10.3)
Chloride: 102 mmol/L (ref 98–111)
Creatinine, Ser: 0.79 mg/dL (ref 0.44–1.00)
GFR calc Af Amer: >60 mL/min (ref >60)
GFR calc non Af Amer: >60 mL/min (ref >60)
Glucose, Bld: 94 mg/dL (ref 70–99)
POTASSIUM: 3.8 mmol/L (ref 3.5–5.1)
SODIUM: 132 mmol/L — AB (ref 135–145)
Total Bilirubin: 0.7 mg/dL (ref 0.3–1.2)
Total Protein: 7.2 g/dL (ref 6.5–8.1)

## 2018-06-07 LAB — CBC WITH DIFFERENTIAL/PLATELET
Abs Immature Granulocytes: 0.01 10*3/uL (ref 0.00–0.07)
Basophils Absolute: 0 10*3/uL (ref 0.0–0.1)
Basophils Relative: 1 %
Eosinophils Absolute: 0.1 10*3/uL (ref 0.0–0.5)
Eosinophils Relative: 3 %
HEMATOCRIT: 29.5 % — AB (ref 36.0–46.0)
HEMOGLOBIN: 10 g/dL — AB (ref 12.0–15.0)
Immature Granulocytes: 0 %
LYMPHS ABS: 0.8 10*3/uL (ref 0.7–4.0)
Lymphocytes Relative: 30 %
MCH: 33.7 pg (ref 26.0–34.0)
MCHC: 33.9 g/dL (ref 30.0–36.0)
MCV: 99.3 fL (ref 80.0–100.0)
MONOS PCT: 9 %
Monocytes Absolute: 0.3 10*3/uL (ref 0.1–1.0)
Neutro Abs: 1.6 10*3/uL — ABNORMAL LOW (ref 1.7–7.7)
Neutrophils Relative %: 57 %
Platelets: 166 10*3/uL (ref 150–400)
RBC: 2.97 MIL/uL — ABNORMAL LOW (ref 3.87–5.11)
RDW: 16.6 % — ABNORMAL HIGH (ref 11.5–15.5)
WBC: 2.8 10*3/uL — ABNORMAL LOW (ref 4.0–10.5)
nRBC: 0 % (ref 0.0–0.2)

## 2018-06-07 NOTE — Progress Notes (Signed)
Patient here for follow up. Pt states her taste buds have changed due to lymparza. Feels nauseated at times.

## 2018-06-08 LAB — CA 125: Cancer Antigen (CA) 125: 6.9 U/mL (ref 0.0–38.1)

## 2018-06-08 NOTE — Progress Notes (Signed)
[facility] Follow up visit  Patient Care Team: Patient, [name], [name], [name], [name], MD as Referring Physician (Obstetrics and Gynecology)  CHIEF COMPLAINTS/PURPOSE OF Visit Follow up for Treatment of  ovarian cancer.  HISTORY OF PRESENTING ILLNESS: Katie Hill 68 y.o. female presents for follow up of management of stage IIIC ovarian cancer. She underwent  Neoadjuvant chemotherapy of carbo and taxol x 4  # Patient had debulking surgery on 03/14/2017. She had a laparoscopy with conversion to laparotomy, total hysterectomy, with bilateral salpingo oophorectomy, right aortic lymph node dissection, omentectomy. Pathology showed small foci of residual disease in ovary.   Genetic testing negative for83 genes on [facility]'s Multi-Cancer panel (ALK, APC, ATM, AXIN2, BAP1, BARD1, BLM, BMPR1A, BRCA1, BRCA2, BRIP1, CASR, CDC73, CDH1, CDK4, CDKN1B, CDKN1C, CDKN2A, CEBPA, CHEK2, CTNNA1, DICER1, DIS3L2, EGFR, EPCAM, FH, FLCN, GATA2, GPC3, GREM1, HOXB13, HRAS, KIT, MAX, MEN1, MET, MITF, MLH1, MSH2, MSH3, MSH6, MUTYH, NBN, NF1, NF2, NTHL1, PALB2, PDGFRA, PHOX2B, PMS2, POLD1, POLE, POT1, PRKAR1A, PTCH1, PTEN, RAD50, RAD51C, RAD51D, RB1, RECQL4, RET, RUNX1, SDHA, SDHAF2, SDHB, SDHC, SDHD, SMAD4, SMARCA4, SMARCB1, SMARCE1, STK11, SUFU, TERC, TERT, TMEM127, TP53, TSC1, TSC2, VHL, WRN, WT1).  A Variant of UncertainSignificancewas detected: CASRc.106G>A (p.Gly36Arg). Myraid testing negative for somatic BRACA1/2, positive for HRD.   # HRD positive, was referred to [facility] for clinical trials of Olarparib maintenance. She opted out.  #  Treatment:  Stage IIIC Ovarian cancer:  #s/p Carboplatin and taxol x 4 neoadjuvant chemotherapy followed by debulking surgery 03/14/2017 # 03/28/2017  S/p Adjuvant carbo and taxol x3   Local recurrence, CA125 one 46.3 03/15/2018 Carboplatin and Taxol   INTERVAL HISTORY 68 y.o. female with above oncology history reviewed by me today presents for evaluation for tolerability of olaparib. Patient has been started on olaparib 300 mg twice daily since 05/17/2018. Reports tolerating well except that she feels taste change for a few hours after she takes medication. Otherwise no new complaints.  Denies any nausea vomiting, abdominal pain Chronic fatigue at baseline.   Review of Systems  Constitutional: Positive for fatigue. Negative for appetite change, chills and fever.  HENT:   Negative for hearing loss and voice change.   Eyes: Negative for eye problems.  Respiratory: Negative for chest tightness and cough.   Cardiovascular: Negative for chest pain.  Gastrointestinal: Negative for abdominal distention, abdominal pain and blood in stool.  Endocrine: Negative for hot flashes.  Genitourinary: Negative for bladder incontinence, difficulty urinating, frequency and hematuria.   Musculoskeletal: Negative for arthralgias.  Skin: Negative for itching and rash.  Neurological: Negative for extremity weakness.  Hematological: Negative for adenopathy.  Psychiatric/Behavioral: Negative for confusion.   MEDICAL HISTORY: Past Medical History:  Diagnosis Date  . Dysrhythmia   . Genetic testing 03/28/2017   Multi-Cancer panel (83 genes) @ [facility] - No pathogenic mutations detected  . High grade ovarian cancer ([place]) 11/20/2016  . Pelvic mass in female     SURGICAL HISTORY: Past Surgical History:  Procedure Laterality Date  . APPENDECTOMY    . LAPAROSCOPY N/A 03/14/2017   Procedure: LAPAROSCOPY OPERATIVE;  Surgeon: Mellody Drown, MD;  Location: [facility];  Service: Gynecology;  Laterality: N/A;  . LAPAROTOMY N/A 03/14/2017   Procedure: LAPAROTOMY;  Surgeon: Mellody Drown, MD;  Location: [facility];  Service: Gynecology;  Laterality: N/A;  .  LYMPH  NODE DISSECTION N/A 03/14/2017   Procedure: LYMPH NODE DISSECTION;  Surgeon: Mellody Drown, MD;  Location: [facility];  Service: Gynecology;  Laterality: N/A;  . OMENTECTOMY N/A 03/14/2017   Procedure: OMENTECTOMY;  Surgeon: Mellody Drown, MD;  Location: [facility];  Service: Gynecology;  Laterality: N/A;  . PORTA CATH INSERTION N/A 11/27/2016   Procedure: Glori Luis Cath Insertion;  Surgeon: Algernon Huxley, MD;  Location: [place] [facility];  Service: Cardiovascular;  Laterality: N/A;    SOCIAL HISTORY: Social History   Socioeconomic History  . Marital status: Married    Spouse name: Not on file  . Number of children: Not on file  . Years of education: Not on file  . Highest education level: Not on file  Occupational History  . Not on file  Social Needs  . Financial resource strain: Not on file  . Food insecurity:    Worry: Not on file    Inability: Not on file  . Transportation needs:    Medical: Not on file    Non-medical: Not on file  Tobacco Use  . Smoking status: Never Smoker  . Smokeless tobacco: Never Used  Substance and Sexual Activity  . Alcohol use: No    Frequency: Never    Comment: use to be occ. but not any since 5 months  . Drug use: No  . Sexual activity: Yes    Birth control/protection: Post-menopausal  Lifestyle  . Physical activity:    Days per week: Not on file    Minutes per session: Not on file  . Stress: Not on file  Relationships  . Social connections:    Talks on phone: Not on file    Gets together: Not on file    Attends religious service: Not on file    Active member of club or organization: Not on file    Attends meetings of clubs or organizations: Not on file    Relationship status: Not on file  . Intimate partner violence:    Fear of current or ex partner: Not on file    Emotionally abused: Not on file    Physically abused: Not on file    Forced sexual activity: Not on file  Other Topics Concern  . Not on file  Social History Narrative   . Not on file    FAMILY HISTORY Family History  Problem Relation Age of Onset  . Throat cancer Cousin   . Throat cancer Cousin   . Leukemia Cousin     ALLERGIES:  is allergic to omeprazole.  MEDICATIONS:  Current Outpatient Medications  Medication Sig Dispense Refill  . calcium carbonate (TUMS - DOSED IN MG ELEMENTAL CALCIUM) 500 MG chewable tablet Chew 1 tablet by mouth as needed for indigestion or heartburn.    . lidocaine-prilocaine (EMLA) cream Apply 1 application topically as needed. Apply small amount to port site at least 1 hour prior to it being accessed, cover with plastic wrap 30 g 1  . loperamide (IMODIUM) 2 MG capsule Take 1 capsule (2 mg total) by mouth See admin instructions. With onset of loose stool, take '4mg'$  followed by '2mg'$  every 2 hours until 12 hours have passed without loose bowel movement. Maximum: 16 mg/day 120 capsule 1  . olaparib (LYNPARZA) 150 MG tablet Take 2 tablets (300 mg total) by mouth 2 (two) times daily. 120 tablet 2  . ondansetron (ZOFRAN) 8 MG tablet Take 1 tablet (8 mg total) by mouth 2 (two) times daily as needed  for refractory nausea / vomiting. Start on day 3 after chemo. 30 tablet 1  . prochlorperazine (COMPAZINE) 10 MG tablet Take 1 tablet (10 mg total) by mouth every 6 (six) hours as needed (Nausea or vomiting). 30 tablet 1  . Tetrahydrozoline HCl (REDNESS RELIEVER EYE DROPS OP) Place 1 drop into both eyes as needed (for red eyes).     [name] dexamethasone (DECADRON) 4 MG tablet Take 2 tablets (8 mg total) by mouth daily. Take 2 days before and 2 days after chemotherapy (Patient not taking: Reported on 06/07/2018) 60 tablet 1   No current facility-administered medications for this visit.     PHYSICAL EXAMINATION:  ECOG PERFORMANCE STATUS: 0 - Asymptomatic Vitals:   06/07/18 0921  BP: (!) 96/59  Pulse: 75  Resp: 18  Temp: (!) 97.5 F (36.4 C)    Filed Weights   06/07/18 0921  Weight: 123 lb 14.4 oz (56.2 kg)     Physical Exam   Constitutional: She is oriented to person, place, and time. No distress.  HENT:  Head: Normocephalic and atraumatic.  Nose: Nose normal.  Mouth/Throat: Oropharynx is clear and moist. No oropharyngeal exudate.  Eyes: Pupils are equal, round, and reactive to light. EOM are normal. No scleral icterus.  Neck: Normal range of motion. Neck supple. No JVD present.  Cardiovascular: Normal rate and regular rhythm.  No murmur heard. Pulmonary/Chest: Effort normal and breath sounds normal. No respiratory distress. She has no rales. She exhibits no tenderness.  Abdominal: Soft. Bowel sounds are normal. She exhibits no distension. There is no abdominal tenderness.  Musculoskeletal: Normal range of motion.        General: No edema.  Lymphadenopathy:    She has no cervical adenopathy.  Neurological: She is alert and oriented to person, place, and time. No cranial nerve deficit. She exhibits normal muscle tone. Coordination normal.  Skin: Skin is warm and dry. She is not diaphoretic. No erythema.  Right anterior medi port + 0.5cm raised erythematous skin lesion on anterior chest wall.   Psychiatric: Affect and judgment normal.       SKIN: LABORATORY DATA: I have personally reviewed the data as listed: CBC    Component Value Date/Time   WBC 2.8 (L) 06/07/2018 0905   RBC 2.97 (L) 06/07/2018 0905   HGB 10.0 (L) 06/07/2018 0905   HCT 29.5 (L) 06/07/2018 0905   PLT 166 06/07/2018 0905   MCV 99.3 06/07/2018 0905   MCH 33.7 06/07/2018 0905   MCHC 33.9 06/07/2018 0905   RDW 16.6 (H) 06/07/2018 0905   LYMPHSABS 0.8 06/07/2018 0905   MONOABS 0.3 06/07/2018 0905   EOSABS 0.1 06/07/2018 0905   BASOSABS 0.0 06/07/2018 0905   CMP Latest Ref Rng & Units 06/07/2018 05/22/2018 05/17/2018  Glucose 70 - 99 mg/dL 94 99 96  BUN 8 - 23 mg/dL _0 Creatinine 0.44 - 1.00 mg/dL 0.79 0.68 0.52  Sodium 135 - 145 mmol/L 132(L) 133(L) 133(L)  Potassium 3.5 - 5.1 mmol/L 3.8 3.9 3.6  Chloride 98 - 111 mmol/L 102  103 106  CO2 22 - 32 mmol/L _1 Calcium 8.9 - 10.3 mg/dL 9.6 9.6 9.2  Total Protein 6.5 - 8.1 g/dL 7.2 7.2 6.7  Total Bilirubin 0.3 - 1.2 mg/dL 0.7 0.7 0.6  Alkaline Phos 38 - 126 U/L 59 60 56  AST 15 - 41 U/L _2 ALT 0 - 44 U/L _3 pathology 11/16/2016 Surgical  Pathology  CASE: [id number]  PATIENT: Allexis Blanck  Surgical Pathology Report   SPECIMEN SUBMITTED:  A. Retroperitoneal adenopathy, left  DIAGNOSIS:  A. LYMPH NODE, LEFT RETROPERITONEAL; CT-GUIDED CORE BIOPSY:  - METASTATIC HIGH-GRADE SEROUS CARCINOMA.   RADIOGRAPHIC STUDIES: I have personally reviewed the radiological images as listed and agree with the findings in the report CT chest abdomen pelvis with contrast 11/13/2016 IMPRESSION: 1. Large heterogeneous central pelvic mass measures up to 12.6 cm. The lesion generates substantial mass-effect on the uterus, bladder, and pelvic sidewall anatomy. The mass appears to invade the mid sigmoid colon. Etiology of the central pelvic mass is not definitive by CT, but ovarian primary is suspected. The lesion becomes indistinguishable from the posterior uterus on some images and uterine origin is possible, but the uterus does not appear to be the epicenter of the mass and appears to be more displaced by it. MRI of the pelvis without and with contrast may prove helpful to better delineate the relationship of the uterus to the central pelvic mass although it may not be able to definitively localize the origin. 2. Bulky retroperitoneal and left pelvic sidewall lymphadenopathy. The retroperitoneal lymphadenopathy generates substantial mass-effect on the IVC and left renal vein. The external iliac veins along each pelvic sidewall are markedly attenuated by the mass/lymphadenopathy. Patency of these vessels cannot be definitely confirmed on this exam.  Pathology 03/14/2017   DIAGNOSIS:  A. OMENTUM; OMENTECTOMY:  - NO TUMOR SEEN.  - ONE NEGATIVE LYMPH NODE (0/1).    B. RIGHT FALLOPIAN TUBE AND OVARY; SALPINGO-OOPHORECTOMY:  - SMALL FOCI OF HIGH GRADE SEROUS CARCINOMA INVOLVING THE OVARY.  - MARKED THERAPY RELATED CHANGE.  - NO TUMOR SEEN IN THE FALLOPIAN TUBE.   C. UTERUS, CERVIX, LEFT FALLOPIAN TUBE AND OVARY; HYSTERECTOMY AND LEFT  SALPINGO-OOPHORECTOMY:  - NABOTHIAN CYSTS.  - CYSTIC ATROPHY OF THE ENDOMETRIUM.  - SEROSAL ADHESIONS.  - UNREMARKABLE FALLOPIAN TUBE.  - OVARY SHOWING TREATMENT RELATED CHANGE.   D. 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   Results for KINZA, GOUVEIA" (MRN [id number]) as of 03/04/2018 16:52  Ref. Range 11/16/2016 08:47 12/25/2016 09:26 01/15/2017 09:05 02/05/2017 10:07 02/28/2017 13:35 03/28/2017 08:27 05/09/2017 08:35 07/04/2017 14:49 08/27/2017 09:45 11/09/2017 11:14 12/24/2017 11:25 02/18/2018 14:48  Cancer Antigen (CA) 125 Latest Ref Range: 0.0 - 38.1 U/mL 4,382.0 (H) 699.1 (H) 207.6 (H) 64.6 (H) 36.9 31.9 7.4 11.3 14.1 21.0 25.7 46.3 (H)    RADIOGRAPHIC STUDIES: I have personally reviewed the radiological images as listed and agreed with the findings in the report. 02/28/2018 CT chest abdomen pelvis showed interval enlargement off and aorto caval lymph node which currently measures 1.4 cm in short axis.  This lymph node is in close proximity to the previously resected retroperitoneal lymphadenopathy and it was a concern for additional nodal metastasis.Small lung nodules stable.  ASSESSMENT/PLAN Cancer Staging Malignant neoplasm of ovary [facility]) Staging form: Ovary, Fallopian Tube, and Primary Peritoneal Carcinoma, AJCC 8th Edition - Clinical stage from 11/22/2016: Stage IIIC (cT3c, cN1b, cM0) - Signed by Earlie Server, MD on 11/22/2016  1. Encounter for antineoplastic chemotherapy   2. Port-A-Cath in place   3. High grade ovarian cancer ([place])   4. Anemia associated with chemotherapy   5. Hyponatremia   : # Ovarian Cancer, local  recurrence.  Tolerating olaparib 300 mg twice daily as maintenance. We will continue.  CA125 6.9.  #Normocytic anemia, likely secondary to olaparib.  Continue to monitor. # Medi  port will need to be flushed every 6 weeks.  Patient is aware about that. #Hyponatremia, chronic.  Continue to monitor  RTF in 4 weeks with labs MD assessment.   Earlie Server, MD, PhD Hematology Oncology [facility] at [facility] Pager- [phone] 06/07/2018

## 2018-06-11 ENCOUNTER — Telehealth: Payer: Self-pay | Admitting: *Deleted

## 2018-06-11 NOTE — Telephone Encounter (Signed)
Husband called requesting results of markers.  Please return call [phone].    Ref Range & Units 4d ago (06/07/18) 3wk ago (05/17/18) 77mo ago (04/26/18) 47mo ago (04/05/18) 60mo ago (03/15/18) 24mo ago (02/18/18) 26mo ago (12/24/17)  Cancer Antigen (CA) 125 0.0 - 38.1 U/mL 6.9  6.0 CM 6.3 CM 15.3 CM 49.7High  CM 46.3High  CM 25.7 CM  Comment: (NOTE)  Roche [employer]ostics Electrochemiluminescence Immunoassay (ECLIA)  Values obtained with different assay methods or kits cannot be  used interchangeably. Results cannot be interpreted as absolute  evidence of the presence or absence of malignant disease.  Performed At: [facility]  [address] [place], [place] [id number]  Rush Farmer MD NO:[id number]   Resulting Agency  [facility] [place] [facility] [place] [facility] [place] [facility]      Specimen Collected: 06/07/18 09:05  Last Resulted: 06/08/18 03:36

## 2018-06-12 NOTE — Telephone Encounter (Signed)
Left vm notifying pt of CA125 result

## 2018-06-21 ENCOUNTER — Inpatient Hospital Stay: Payer: Medicare Other | Attending: Oncology

## 2018-06-21 ENCOUNTER — Other Ambulatory Visit: Payer: Self-pay

## 2018-06-21 DIAGNOSIS — D539 Nutritional anemia, unspecified: Secondary | ICD-10-CM | POA: Diagnosis not present

## 2018-06-21 DIAGNOSIS — D701 Agranulocytosis secondary to cancer chemotherapy: Secondary | ICD-10-CM | POA: Insufficient documentation

## 2018-06-21 DIAGNOSIS — C569 Malignant neoplasm of unspecified ovary: Secondary | ICD-10-CM

## 2018-06-21 DIAGNOSIS — E871 Hypo-osmolality and hyponatremia: Secondary | ICD-10-CM | POA: Diagnosis not present

## 2018-06-21 DIAGNOSIS — C561 Malignant neoplasm of right ovary: Secondary | ICD-10-CM | POA: Insufficient documentation

## 2018-06-21 LAB — COMPREHENSIVE METABOLIC PANEL
ALT: 9 U/L (ref 0–44)
AST: 14 U/L — ABNORMAL LOW (ref 15–41)
Albumin: 4.3 g/dL (ref 3.5–5.0)
Alkaline Phosphatase: 56 U/L (ref 38–126)
Anion gap: 5 (ref 5–15)
BUN: 10 mg/dL (ref 8–23)
CO2: 24 mmol/L (ref 22–32)
CREATININE: 0.73 mg/dL (ref 0.44–1.00)
Calcium: 9.5 mg/dL (ref 8.9–10.3)
Chloride: 104 mmol/L (ref 98–111)
GFR calc non Af Amer: >60 mL/min (ref >60)
Glucose, Bld: 94 mg/dL (ref 70–99)
Potassium: 3.8 mmol/L (ref 3.5–5.1)
Sodium: 133 mmol/L — ABNORMAL LOW (ref 135–145)
Total Bilirubin: 0.6 mg/dL (ref 0.3–1.2)
Total Protein: 6.7 g/dL (ref 6.5–8.1)

## 2018-06-21 LAB — CBC WITH DIFFERENTIAL/PLATELET
Abs Immature Granulocytes: 0.01 10*3/uL (ref 0.00–0.07)
Basophils Absolute: 0 10*3/uL (ref 0.0–0.1)
Basophils Relative: 0 %
EOS PCT: 4 %
Eosinophils Absolute: 0.1 10*3/uL (ref 0.0–0.5)
HEMATOCRIT: 26.5 % — AB (ref 36.0–46.0)
Hemoglobin: 9.2 g/dL — ABNORMAL LOW (ref 12.0–15.0)
Immature Granulocytes: 0 %
Lymphocytes Relative: 33 %
Lymphs Abs: 0.8 10*3/uL (ref 0.7–4.0)
MCH: 35.5 pg — ABNORMAL HIGH (ref 26.0–34.0)
MCHC: 34.7 g/dL (ref 30.0–36.0)
MCV: 102.3 fL — ABNORMAL HIGH (ref 80.0–100.0)
Monocytes Absolute: 0.3 10*3/uL (ref 0.1–1.0)
Monocytes Relative: 12 %
NRBC: 0 % (ref 0.0–0.2)
Neutro Abs: 1.2 10*3/uL — ABNORMAL LOW (ref 1.7–7.7)
Neutrophils Relative %: 51 %
Platelets: 132 10*3/uL — ABNORMAL LOW (ref 150–400)
RBC: 2.59 MIL/uL — ABNORMAL LOW (ref 3.87–5.11)
RDW: 18.1 % — ABNORMAL HIGH (ref 11.5–15.5)
WBC: 2.3 10*3/uL — ABNORMAL LOW (ref 4.0–10.5)

## 2018-06-22 LAB — CA 125: CANCER ANTIGEN (CA) 125: 7 U/mL (ref 0.0–38.1)

## 2018-06-24 ENCOUNTER — Other Ambulatory Visit: Payer: Self-pay | Admitting: Oncology

## 2018-06-24 DIAGNOSIS — D702 Other drug-induced agranulocytosis: Secondary | ICD-10-CM

## 2018-06-24 DIAGNOSIS — D696 Thrombocytopenia, unspecified: Secondary | ICD-10-CM

## 2018-06-27 ENCOUNTER — Other Ambulatory Visit: Payer: Self-pay

## 2018-06-28 ENCOUNTER — Other Ambulatory Visit: Payer: Self-pay

## 2018-06-28 ENCOUNTER — Inpatient Hospital Stay: Payer: Medicare Other

## 2018-06-28 DIAGNOSIS — D701 Agranulocytosis secondary to cancer chemotherapy: Secondary | ICD-10-CM | POA: Diagnosis not present

## 2018-06-28 DIAGNOSIS — D696 Thrombocytopenia, unspecified: Secondary | ICD-10-CM

## 2018-06-28 DIAGNOSIS — E871 Hypo-osmolality and hyponatremia: Secondary | ICD-10-CM | POA: Diagnosis not present

## 2018-06-28 DIAGNOSIS — D702 Other drug-induced agranulocytosis: Secondary | ICD-10-CM

## 2018-06-28 DIAGNOSIS — Z95828 Presence of other vascular implants and grafts: Secondary | ICD-10-CM

## 2018-06-28 DIAGNOSIS — C561 Malignant neoplasm of right ovary: Secondary | ICD-10-CM | POA: Diagnosis not present

## 2018-06-28 DIAGNOSIS — D539 Nutritional anemia, unspecified: Secondary | ICD-10-CM | POA: Diagnosis not present

## 2018-06-28 DIAGNOSIS — C569 Malignant neoplasm of unspecified ovary: Secondary | ICD-10-CM

## 2018-06-28 MED ORDER — HEPARIN SOD (PORK) LOCK FLUSH 100 UNIT/ML IV SOLN
500.0000 [IU] | Freq: Once | INTRAVENOUS | Status: AC
  Administered 2018-06-28: 500 [IU] via INTRAVENOUS

## 2018-06-28 MED ORDER — SODIUM CHLORIDE 0.9% FLUSH
10.0000 mL | Freq: Once | INTRAVENOUS | Status: AC
  Administered 2018-06-28: 10 mL via INTRAVENOUS
  Filled 2018-06-28: qty 10

## 2018-07-04 ENCOUNTER — Other Ambulatory Visit: Payer: Self-pay

## 2018-07-05 ENCOUNTER — Other Ambulatory Visit: Payer: Self-pay

## 2018-07-05 ENCOUNTER — Encounter: Payer: Self-pay | Admitting: Oncology

## 2018-07-05 ENCOUNTER — Inpatient Hospital Stay (HOSPITAL_BASED_OUTPATIENT_CLINIC_OR_DEPARTMENT_OTHER): Payer: Medicare Other | Admitting: Oncology

## 2018-07-05 ENCOUNTER — Inpatient Hospital Stay: Payer: Medicare Other

## 2018-07-05 ENCOUNTER — Inpatient Hospital Stay: Payer: Medicare Other | Admitting: Oncology

## 2018-07-05 VITALS — BP 104/66 | HR 66 | Temp 96.2°F | Resp 18 | Wt 124.4 lb

## 2018-07-05 DIAGNOSIS — E871 Hypo-osmolality and hyponatremia: Secondary | ICD-10-CM

## 2018-07-05 DIAGNOSIS — C561 Malignant neoplasm of right ovary: Secondary | ICD-10-CM | POA: Diagnosis not present

## 2018-07-05 DIAGNOSIS — D539 Nutritional anemia, unspecified: Secondary | ICD-10-CM

## 2018-07-05 DIAGNOSIS — D701 Agranulocytosis secondary to cancer chemotherapy: Secondary | ICD-10-CM

## 2018-07-05 DIAGNOSIS — Z95828 Presence of other vascular implants and grafts: Secondary | ICD-10-CM

## 2018-07-05 DIAGNOSIS — D702 Other drug-induced agranulocytosis: Secondary | ICD-10-CM

## 2018-07-05 DIAGNOSIS — Z5111 Encounter for antineoplastic chemotherapy: Secondary | ICD-10-CM

## 2018-07-05 DIAGNOSIS — C569 Malignant neoplasm of unspecified ovary: Secondary | ICD-10-CM

## 2018-07-05 LAB — COMPREHENSIVE METABOLIC PANEL
ALT: 7 U/L (ref 0–44)
ANION GAP: 5 (ref 5–15)
AST: 14 U/L — ABNORMAL LOW (ref 15–41)
Albumin: 4.2 g/dL (ref 3.5–5.0)
Alkaline Phosphatase: 57 U/L (ref 38–126)
BUN: 9 mg/dL (ref 8–23)
CO2: 25 mmol/L (ref 22–32)
Calcium: 9.7 mg/dL (ref 8.9–10.3)
Chloride: 103 mmol/L (ref 98–111)
Creatinine, Ser: 0.74 mg/dL (ref 0.44–1.00)
GFR calc Af Amer: >60 mL/min (ref >60)
GFR calc non Af Amer: >60 mL/min (ref >60)
GLUCOSE: 97 mg/dL (ref 70–99)
Potassium: 3.9 mmol/L (ref 3.5–5.1)
Sodium: 133 mmol/L — ABNORMAL LOW (ref 135–145)
TOTAL PROTEIN: 6.7 g/dL (ref 6.5–8.1)
Total Bilirubin: 0.6 mg/dL (ref 0.3–1.2)

## 2018-07-05 LAB — CBC WITH DIFFERENTIAL/PLATELET
Abs Immature Granulocytes: 0.01 10*3/uL (ref 0.00–0.07)
Basophils Absolute: 0 10*3/uL (ref 0.0–0.1)
Basophils Relative: 0 %
EOS PCT: 4 %
Eosinophils Absolute: 0.1 10*3/uL (ref 0.0–0.5)
HCT: 25.8 % — ABNORMAL LOW (ref 36.0–46.0)
Hemoglobin: 9 g/dL — ABNORMAL LOW (ref 12.0–15.0)
Immature Granulocytes: 0 %
Lymphocytes Relative: 33 %
Lymphs Abs: 0.8 10*3/uL (ref 0.7–4.0)
MCH: 37.2 pg — ABNORMAL HIGH (ref 26.0–34.0)
MCHC: 34.9 g/dL (ref 30.0–36.0)
MCV: 106.6 fL — ABNORMAL HIGH (ref 80.0–100.0)
Monocytes Absolute: 0.2 10*3/uL (ref 0.1–1.0)
Monocytes Relative: 10 %
Neutro Abs: 1.3 10*3/uL — ABNORMAL LOW (ref 1.7–7.7)
Neutrophils Relative %: 53 %
Platelets: 120 10*3/uL — ABNORMAL LOW (ref 150–400)
RBC: 2.42 MIL/uL — ABNORMAL LOW (ref 3.87–5.11)
RDW: 19.3 % — ABNORMAL HIGH (ref 11.5–15.5)
WBC: 2.4 10*3/uL — ABNORMAL LOW (ref 4.0–10.5)
nRBC: 0 % (ref 0.0–0.2)

## 2018-07-05 NOTE — Progress Notes (Signed)
[facility] Follow up visit  Patient Care Team: Patient, [name], [name], [name], [name], MD as Referring Physician (Obstetrics and Gynecology)  CHIEF COMPLAINTS/PURPOSE OF Visit Follow up for Treatment tolerability of  ovarian cancer.  HISTORY OF PRESENTING ILLNESS: Katie Hill 68 y.o. female presents for follow up of management of stage IIIC ovarian cancer. She underwent  Neoadjuvant chemotherapy of carbo and taxol x 4  # Patient had debulking surgery on 03/14/2017. She had a laparoscopy with conversion to laparotomy, total hysterectomy, with bilateral salpingo oophorectomy, right aortic lymph node dissection, omentectomy. Pathology showed small foci of residual disease in ovary.   Genetic testing negative for83 genes on [facility]'s Multi-Cancer panel (ALK, APC, ATM, AXIN2, BAP1, BARD1, BLM, BMPR1A, BRCA1, BRCA2, BRIP1, CASR, CDC73, CDH1, CDK4, CDKN1B, CDKN1C, CDKN2A, CEBPA, CHEK2, CTNNA1, DICER1, DIS3L2, EGFR, EPCAM, FH, FLCN, GATA2, GPC3, GREM1, HOXB13, HRAS, KIT, MAX, MEN1, MET, MITF, MLH1, MSH2, MSH3, MSH6, MUTYH, NBN, NF1, NF2, NTHL1, PALB2, PDGFRA, PHOX2B, PMS2, POLD1, POLE, POT1, PRKAR1A, PTCH1, PTEN, RAD50, RAD51C, RAD51D, RB1, RECQL4, RET, RUNX1, SDHA, SDHAF2, SDHB, SDHC, SDHD, SMAD4, SMARCA4, SMARCB1, SMARCE1, STK11, SUFU, TERC, TERT, TMEM127, TP53, TSC1, TSC2, VHL, WRN, WT1).  A Variant of UncertainSignificancewas detected: CASRc.106G>A (p.Gly36Arg). Myraid testing negative for somatic BRACA1/2, positive for HRD.   # HRD positive, was referred to [facility] for clinical trials of Olarparib maintenance. She opted out.  #  Treatment:  Stage IIIC Ovarian cancer:  #s/p Carboplatin and taxol x 4 neoadjuvant chemotherapy followed by debulking surgery 03/14/2017 #  03/28/2017 S/p Adjuvant carbo and taxol x3   Local recurrence, CA125 one 46.3 03/15/2018 Carboplatin and Taxol   INTERVAL HISTORY 68 y.o. female with above oncology history reviewed by me today presents for evaluation for tolerability of olaparib. Patient reports feeling well.  Takes olaparib 300 mg twice daily since 05/17/2018. Reports tolerating well.  No significant side effects she is experiencing. No new complaints. Denies any nausea vomiting, fever, abdominal pain or diarrhea. Chronic fatigue at baseline.    Review of Systems  Constitutional: Positive for fatigue. Negative for appetite change, chills and fever.  HENT:   Negative for hearing loss and voice change.   Eyes: Negative for eye problems.  Respiratory: Negative for chest tightness and cough.   Cardiovascular: Negative for chest pain.  Gastrointestinal: Negative for abdominal distention, abdominal pain and blood in stool.  Endocrine: Negative for hot flashes.  Genitourinary: Negative for bladder incontinence, difficulty urinating, frequency and hematuria.   Musculoskeletal: Negative for arthralgias.  Skin: Negative for itching and rash.  Neurological: Negative for extremity weakness.  Hematological: Negative for adenopathy.  Psychiatric/Behavioral: Negative for confusion.   MEDICAL HISTORY: Past Medical History:  Diagnosis Date  . Dysrhythmia   . Genetic testing 03/28/2017   Multi-Cancer panel (83 genes) @ [facility] - No pathogenic mutations detected  . High grade ovarian cancer ([place]) 11/20/2016  . Pelvic mass in female     SURGICAL HISTORY: Past Surgical History:  Procedure Laterality Date  . APPENDECTOMY    . LAPAROSCOPY N/A 03/14/2017   Procedure: LAPAROSCOPY OPERATIVE;  Surgeon: Mellody Drown, MD;  Location: [facility];  Service: Gynecology;  Laterality: N/A;  . LAPAROTOMY N/A 03/14/2017   Procedure: LAPAROTOMY;  Surgeon: Mellody Drown, MD;  Location: [facility];  Service: Gynecology;  Laterality: N/A;  .  LYMPH  NODE DISSECTION N/A 03/14/2017   Procedure: LYMPH NODE DISSECTION;  Surgeon: Mellody Drown, MD;  Location: [facility];  Service: Gynecology;  Laterality: N/A;  . OMENTECTOMY N/A 03/14/2017   Procedure: OMENTECTOMY;  Surgeon: Mellody Drown, MD;  Location: [facility];  Service: Gynecology;  Laterality: N/A;  . PORTA CATH INSERTION N/A 11/27/2016   Procedure: Glori Luis Cath Insertion;  Surgeon: Algernon Huxley, MD;  Location: [place] [facility];  Service: Cardiovascular;  Laterality: N/A;    SOCIAL HISTORY: Social History   Socioeconomic History  . Marital status: Married    Spouse name: Not on file  . Number of children: Not on file  . Years of education: Not on file  . Highest education level: Not on file  Occupational History  . Not on file  Social Needs  . Financial resource strain: Not on file  . Food insecurity:    Worry: Not on file    Inability: Not on file  . Transportation needs:    Medical: Not on file    Non-medical: Not on file  Tobacco Use  . Smoking status: Never Smoker  . Smokeless tobacco: Never Used  Substance and Sexual Activity  . Alcohol use: No    Frequency: Never    Comment: use to be occ. but not any since 5 months  . Drug use: No  . Sexual activity: Yes    Birth control/protection: Post-menopausal  Lifestyle  . Physical activity:    Days per week: Not on file    Minutes per session: Not on file  . Stress: Not on file  Relationships  . Social connections:    Talks on phone: Not on file    Gets together: Not on file    Attends religious service: Not on file    Active member of club or organization: Not on file    Attends meetings of clubs or organizations: Not on file    Relationship status: Not on file  . Intimate partner violence:    Fear of current or ex partner: Not on file    Emotionally abused: Not on file    Physically abused: Not on file    Forced sexual activity: Not on file  Other Topics Concern  . Not on file  Social History  Narrative  . Not on file    FAMILY HISTORY Family History  Problem Relation Age of Onset  . Throat cancer Cousin   . Throat cancer Cousin   . Leukemia Cousin     ALLERGIES:  is allergic to omeprazole.  MEDICATIONS:  Current Outpatient Medications  Medication Sig Dispense Refill  . calcium carbonate (TUMS - DOSED IN MG ELEMENTAL CALCIUM) 500 MG chewable tablet Chew 1 tablet by mouth as needed for indigestion or heartburn.    . lidocaine-prilocaine (EMLA) cream Apply 1 application topically as needed. Apply small amount to port site at least 1 hour prior to it being accessed, cover with plastic wrap 30 g 1  . loperamide (IMODIUM) 2 MG capsule Take 1 capsule (2 mg total) by mouth See admin instructions. With onset of loose stool, take 36m followed by 224mevery 2 hours until 12 hours have passed without loose bowel movement. Maximum: 16 mg/day 120 capsule 1  . olaparib (LYNPARZA) 150 MG tablet Take 2 tablets (300 mg total) by mouth 2 (two) times daily. 120 tablet 2  . ondansetron (ZOFRAN) 8 MG tablet Take 1 tablet (8 mg total) by mouth 2 (two) times daily as needed for refractory  nausea / vomiting. Start on day 3 after chemo. 30 tablet 1  . prochlorperazine (COMPAZINE) 10 MG tablet Take 1 tablet (10 mg total) by mouth every 6 (six) hours as needed (Nausea or vomiting). 30 tablet 1  . Tetrahydrozoline HCl (REDNESS RELIEVER EYE DROPS OP) Place 1 drop into both eyes as needed (for red eyes).     [name] dexamethasone (DECADRON) 4 MG tablet Take 2 tablets (8 mg total) by mouth daily. Take 2 days before and 2 days after chemotherapy (Patient not taking: Reported on 06/07/2018) 60 tablet 1   No current facility-administered medications for this visit.     PHYSICAL EXAMINATION:  ECOG PERFORMANCE STATUS: 0 - Asymptomatic Vitals:   07/05/18 0951  BP: 104/66  Pulse: 66  Resp: 18  Temp: (!) 96.2 F (35.7 C)    Filed Weights   07/05/18 0951  Weight: 124 lb 6.4 oz (56.4 kg)     Physical Exam   Constitutional: She is oriented to person, place, and time. No distress.  HENT:  Head: Normocephalic and atraumatic.  Nose: Nose normal.  Mouth/Throat: Oropharynx is clear and moist. No oropharyngeal exudate.  Eyes: Pupils are equal, round, and reactive to light. EOM are normal. No scleral icterus.  Neck: Normal range of motion. Neck supple. No JVD present.  Cardiovascular: Normal rate and regular rhythm.  No murmur heard. Pulmonary/Chest: Effort normal and breath sounds normal. No respiratory distress. She has no rales. She exhibits no tenderness.  Abdominal: Soft. Bowel sounds are normal. She exhibits no distension. There is no abdominal tenderness.  Musculoskeletal: Normal range of motion.        General: No edema.  Lymphadenopathy:    She has no cervical adenopathy.  Neurological: She is alert and oriented to person, place, and time. No cranial nerve deficit. She exhibits normal muscle tone. Coordination normal.  Skin: Skin is warm and dry. She is not diaphoretic. No erythema.  Right anterior medi port + 0.5cm raised erythematous skin lesion on anterior chest wall.   Psychiatric: Affect and judgment normal.       SKIN: LABORATORY DATA: I have personally reviewed the data as listed: CBC    Component Value Date/Time   WBC 2.4 (L) 07/05/2018 0917   RBC 2.42 (L) 07/05/2018 0917   HGB 9.0 (L) 07/05/2018 0917   HCT 25.8 (L) 07/05/2018 0917   PLT 120 (L) 07/05/2018 0917   MCV 106.6 (H) 07/05/2018 0917   MCH 37.2 (H) 07/05/2018 0917   MCHC 34.9 07/05/2018 0917   RDW 19.3 (H) 07/05/2018 0917   LYMPHSABS 0.8 07/05/2018 0917   MONOABS 0.2 07/05/2018 0917   EOSABS 0.1 07/05/2018 0917   BASOSABS 0.0 07/05/2018 0917   CMP Latest Ref Rng & Units 07/05/2018 06/21/2018 06/07/2018  Glucose 70 - 99 mg/dL 97 94 94  BUN 8 - 23 mg/dL _0 Creatinine 0.44 - 1.00 mg/dL 0.74 0.73 0.79  Sodium 135 - 145 mmol/L 133(L) 133(L) 132(L)  Potassium 3.5 - 5.1 mmol/L 3.9 3.8 3.8  Chloride 98 -  111 mmol/L 103 104 102  CO2 22 - 32 mmol/L _1 Calcium 8.9 - 10.3 mg/dL 9.7 9.5 9.6  Total Protein 6.5 - 8.1 g/dL 6.7 6.7 7.2  Total Bilirubin 0.3 - 1.2 mg/dL 0.6 0.6 0.7  Alkaline Phos 38 - 126 U/L 57 56 59  AST 15 - 41 U/L 14(L) 14(L) 18  ALT 0 - 44 U/L _2 pathology 11/16/2016 Surgical  Pathology  CASE: [id number]  PATIENT: Katie Hill  Surgical Pathology Report   SPECIMEN SUBMITTED:  A. Retroperitoneal adenopathy, left  DIAGNOSIS:  A. LYMPH NODE, LEFT RETROPERITONEAL; CT-GUIDED CORE BIOPSY:  - METASTATIC HIGH-GRADE SEROUS CARCINOMA.   RADIOGRAPHIC STUDIES: I have personally reviewed the radiological images as listed and agree with the findings in the report CT chest abdomen pelvis with contrast 11/13/2016 IMPRESSION: 1. Large heterogeneous central pelvic mass measures up to 12.6 cm. The lesion generates substantial mass-effect on the uterus, bladder, and pelvic sidewall anatomy. The mass appears to invade the mid sigmoid colon. Etiology of the central pelvic mass is not definitive by CT, but ovarian primary is suspected. The lesion becomes indistinguishable from the posterior uterus on some images and uterine origin is possible, but the uterus does not appear to be the epicenter of the mass and appears to be more displaced by it. MRI of the pelvis without and with contrast may prove helpful to better delineate the relationship of the uterus to the central pelvic mass although it may not be able to definitively localize the origin. 2. Bulky retroperitoneal and left pelvic sidewall lymphadenopathy. The retroperitoneal lymphadenopathy generates substantial mass-effect on the IVC and left renal vein. The external iliac veins along each pelvic sidewall are markedly attenuated by the mass/lymphadenopathy. Patency of these vessels cannot be definitely confirmed on this exam.  Pathology 03/14/2017   DIAGNOSIS:  A. OMENTUM; OMENTECTOMY:  - NO TUMOR SEEN.  - ONE  NEGATIVE LYMPH NODE (0/1).   B. RIGHT FALLOPIAN TUBE AND OVARY; SALPINGO-OOPHORECTOMY:  - SMALL FOCI OF HIGH GRADE SEROUS CARCINOMA INVOLVING THE OVARY.  - MARKED THERAPY RELATED CHANGE.  - NO TUMOR SEEN IN THE FALLOPIAN TUBE.   C. UTERUS, CERVIX, LEFT FALLOPIAN TUBE AND OVARY; HYSTERECTOMY AND LEFT  SALPINGO-OOPHORECTOMY:  - NABOTHIAN CYSTS.  - CYSTIC ATROPHY OF THE ENDOMETRIUM.  - SEROSAL ADHESIONS.  - UNREMARKABLE FALLOPIAN TUBE.  - OVARY SHOWING TREATMENT RELATED CHANGE.   D. 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   Results for Katie Hill, Katie Hill" (MRN [id number]) as of 03/04/2018 16:52  Ref. Range 11/16/2016 08:47 12/25/2016 09:26 01/15/2017 09:05 02/05/2017 10:07 02/28/2017 13:35 03/28/2017 08:27 05/09/2017 08:35 07/04/2017 14:49 08/27/2017 09:45 11/09/2017 11:14 12/24/2017 11:25 02/18/2018 14:48  Cancer Antigen (CA) 125 Latest Ref Range: 0.0 - 38.1 U/mL 4,382.0 (H) 699.1 (H) 207.6 (H) 64.6 (H) 36.9 31.9 7.4 11.3 14.1 21.0 25.7 46.3 (H)    RADIOGRAPHIC STUDIES: I have personally reviewed the radiological images as listed and agreed with the findings in the report. 02/28/2018 CT chest abdomen pelvis showed interval enlargement off and aorto caval lymph node which currently measures 1.4 cm in short axis.  This lymph node is in close proximity to the previously resected retroperitoneal lymphadenopathy and it was a concern for additional nodal metastasis.Small lung nodules stable.  ASSESSMENT/PLAN Cancer Staging Malignant neoplasm of ovary [facility]) Staging form: Ovary, Fallopian Tube, and Primary Peritoneal Carcinoma, AJCC 8th Edition - Clinical stage from 11/22/2016: Stage IIIC (cT3c, cN1b, cM0) - Signed by Earlie Server, MD on 11/22/2016  1. Drug-induced neutropenia (HCC)   2. Port-A-Cath in place   3. High grade ovarian cancer ([place])   4. Encounter for antineoplastic chemotherapy   5. Macrocytic anemia   6.  Hyponatremia   : # Ovarian Cancer, local recurrence.  Tolerating maintenance olaparib 300 mg twice daily  We will continue.  CA125 was 7 on 06/21/2018.  #Drug-induced neutropenia, ANC 1.3.  Continue to monitor. #Macrocytic anemia.  MCV increased to 106.6. likely due to ola parib.   Will check folate and B12 at next lab encounter.  # Medi port will need to be flushed every 6 weeks.  Patient is aware about that. #Hyponatremia, chronic. Sodium is stable. Continue to monitor  Labs in 2 weeks.  RTF in 4 weeks with labs MD assessment.  We spent sufficient time to discuss many aspect of care, questions were answered to patient's satisfaction. Total face to face encounter time for this patient visit was 25 min. >50% of the time was  spent in counseling and coordination of care.   Earlie Server, MD, PhD Hematology Oncology [facility] at [facility] Pager- [phone] 07/05/18

## 2018-07-05 NOTE — Progress Notes (Signed)
Patient here for follow up. Continues taking Lynparza, no side effects per patient.

## 2018-07-06 LAB — CA 125: Cancer Antigen (CA) 125: 8.4 U/mL (ref 0.0–38.1)

## 2018-07-19 ENCOUNTER — Other Ambulatory Visit: Payer: Self-pay

## 2018-07-19 ENCOUNTER — Inpatient Hospital Stay: Payer: Medicare Other | Attending: Oncology

## 2018-07-19 DIAGNOSIS — D539 Nutritional anemia, unspecified: Secondary | ICD-10-CM | POA: Insufficient documentation

## 2018-07-19 DIAGNOSIS — E538 Deficiency of other specified B group vitamins: Secondary | ICD-10-CM | POA: Diagnosis not present

## 2018-07-19 DIAGNOSIS — D701 Agranulocytosis secondary to cancer chemotherapy: Secondary | ICD-10-CM | POA: Insufficient documentation

## 2018-07-19 DIAGNOSIS — Z452 Encounter for adjustment and management of vascular access device: Secondary | ICD-10-CM | POA: Diagnosis not present

## 2018-07-19 DIAGNOSIS — C569 Malignant neoplasm of unspecified ovary: Secondary | ICD-10-CM | POA: Insufficient documentation

## 2018-07-19 DIAGNOSIS — E871 Hypo-osmolality and hyponatremia: Secondary | ICD-10-CM | POA: Insufficient documentation

## 2018-07-19 LAB — VITAMIN B12: Vitamin B-12: 104 pg/mL — ABNORMAL LOW (ref 180–914)

## 2018-07-19 LAB — CBC WITH DIFFERENTIAL/PLATELET
Abs Immature Granulocytes: 0.01 10*3/uL (ref 0.00–0.07)
Basophils Absolute: 0 10*3/uL (ref 0.0–0.1)
Basophils Relative: 0 %
Eosinophils Absolute: 0.1 10*3/uL (ref 0.0–0.5)
Eosinophils Relative: 2 %
HCT: 27.5 % — ABNORMAL LOW (ref 36.0–46.0)
Hemoglobin: 9.4 g/dL — ABNORMAL LOW (ref 12.0–15.0)
Immature Granulocytes: 0 %
Lymphocytes Relative: 30 %
Lymphs Abs: 0.8 10*3/uL (ref 0.7–4.0)
MCH: 37.6 pg — ABNORMAL HIGH (ref 26.0–34.0)
MCHC: 34.2 g/dL (ref 30.0–36.0)
MCV: 110 fL — ABNORMAL HIGH (ref 80.0–100.0)
Monocytes Absolute: 0.3 10*3/uL (ref 0.1–1.0)
Monocytes Relative: 11 %
Neutro Abs: 1.5 10*3/uL — ABNORMAL LOW (ref 1.7–7.7)
Neutrophils Relative %: 57 %
Platelets: 124 10*3/uL — ABNORMAL LOW (ref 150–400)
RBC: 2.5 MIL/uL — ABNORMAL LOW (ref 3.87–5.11)
RDW: 19.8 % — ABNORMAL HIGH (ref 11.5–15.5)
WBC: 2.6 10*3/uL — ABNORMAL LOW (ref 4.0–10.5)
nRBC: 0 % (ref 0.0–0.2)

## 2018-07-19 LAB — FOLATE: Folate: 21.1 ng/mL (ref >5.9)

## 2018-07-25 ENCOUNTER — Other Ambulatory Visit: Payer: Self-pay | Admitting: Oncology

## 2018-07-25 MED ORDER — VITAMIN B-12 1000 MCG PO TABS: 1000.0000 ug | ORAL_TABLET | Freq: Every day | ORAL | 1 refills | Status: DC

## 2018-08-01 ENCOUNTER — Other Ambulatory Visit: Payer: Self-pay | Admitting: Oncology

## 2018-08-01 DIAGNOSIS — C569 Malignant neoplasm of unspecified ovary: Secondary | ICD-10-CM

## 2018-08-01 NOTE — Telephone Encounter (Signed)
CBC with Differential/Platelet  Order: [id number]  Status:  Final result  Visible to patient:  No (Not Released)  Next appt:  08/02/2018 at 12:45 PM in Oncology ([facility])  Dx:  Macrocytic anemia   Ref Range & Units 13d ago (07/19/18) 3wk ago (07/05/18) 49mo ago (06/21/18) 65mo ago (06/07/18) 93mo ago (05/22/18) 15mo ago (05/17/18) 45mo ago (04/26/18)  WBC 4.0 - 10.5 K/uL 2.6Low   2.4Low   2.3Low   2.8Low   3.1Low   2.7Low   5.3   RBC 3.87 - 5.11 MIL/uL 2.50Low   2.42Low   2.59Low   2.97Low   3.29Low   3.13Low   3.25Low    Hemoglobin 12.0 - 15.0 g/dL 9.4Low   9.0Low   9.2Low   10.0Low   10.5Low   9.9Low   10.2Low    HCT 36.0 - 46.0 % 27.5Low   25.8Low   26.5Low   29.5Low   31.6Low   29.9Low   30.4Low    MCV 80.0 - 100.0 fL 110.0High   106.6High   102.3High   99.3  96.0  95.5  93.5   MCH 26.0 - 34.0 pg 37.6High   37.2High   35.5High   33.7  31.9  31.6  31.4   MCHC 30.0 - 36.0 g/dL 34.2  34.9  34.7  33.9  33.2  33.1  33.6   RDW 11.5 - 15.5 % 19.8High   19.3High   18.1High   16.6High   15.4  15.9High   15.2   Platelets 150 - 400 K/uL 124Low   120Low   132Low   166  120Low   113Low   127Low    nRBC 0.0 - 0.2 % 0.0  0.0  0.0  0.0  0.0  0.0  0.0   Neutrophils Relative % % 57  53  51  57  57  42  52   Neutro Abs 1.7 - 7.7 K/uL 1.5Low   1.3Low   1.2Low   1.6Low   1.8  1.1Low   2.7   Lymphocytes Relative % 30  33  33  30  30  40  33   Lymphs Abs 0.7 - 4.0 K/uL 0.8  0.8  0.8  0.8  0.9  1.1  1.7   Monocytes Relative % 11  10  12  9  10  14  14    Monocytes Absolute 0.1 - 1.0 K/uL 0.3  0.2  0.3  0.3  0.3  0.4  0.8   Eosinophils Relative % 2  4  4  3  1  3   0   Eosinophils Absolute 0.0 - 0.5 K/uL 0.1  0.1  0.1  0.1  0.0  0.1  0.0   Basophils Relative % 0  0  0  1  1  0  0   Basophils Absolute 0.0 - 0.1 K/uL 0.0  0.0  0.0  0.0  0.0  0.0  0.0   Immature Granulocytes % 0  0  0  0  1  1  1    Abs Immature Granulocytes 0.00 - 0.07 K/uL 0.01  0.01 CM 0.01 CM 0.01 CM 0.02 CM 0.02 CM 0.04 CM  Comment: Performed at  [facility], [address] [place], [place] [address]  Resulting Agency  [facility] CL[facility] LAB [place] [facility] [place] [facility] [place] [facility] [place] [facility] [place] [facility] [place] [facility]      Specimen Collected: 07/19/18 09:11  Last Resulted:  07/19/18 09:22       Comprehensive metabolic panel  Order: [id number]  Status:  Final result Visible to patient:  No (Not Released) Next appt:  08/02/2018 at 12:45 PM in Oncology ([facility]) Dx:  Malignant neoplasm of ovary, unspecif...   Ref Range & Units 3wk ago (07/05/18) 4mo ago (06/21/18) 35mo ago (06/07/18)  Sodium 135 - 145 mmol/L 133Low   133Low   132Low    Potassium 3.5 - 5.1 mmol/L 3.9  3.8  3.8   Chloride 98 - 111 mmol/L 103  104  102   CO2 22 - 32 mmol/L 25  24  24    Glucose, Bld 70 - 99 mg/dL 97  94  94   BUN 8 - 23 mg/dL 9  10  12    Creatinine, Ser 0.44 - 1.00 mg/dL 0.74  0.73  0.79   Calcium 8.9 - 10.3 mg/dL 9.7  9.5  9.6   Total Protein 6.5 - 8.1 g/dL 6.7  6.7  7.2   Albumin 3.5 - 5.0 g/dL 4.2  4.3  4.4   AST 15 - 41 U/L 14Low   14Low   18   ALT 0 - 44 U/L 7  9  9    Alkaline Phosphatase 38 - 126 U/L 57  56  59   Total Bilirubin 0.3 - 1.2 mg/dL 0.6  0.6  0.7   GFR calc non Af Amer >60 mL/min >60  >60  >60   GFR calc Af Amer >60 mL/min >60  >60  >60   Anion gap 5 - 15 5  5  CM 6 CM  Comment: Performed at [facility], [address] [place], [place] [address]  Resulting Agency  [facility] CLIN LAB [facility] CLIN LAB [facility] CLIN LAB      Specimen Collected: 07/05/18 09:17 Last Resulted

## 2018-08-02 ENCOUNTER — Other Ambulatory Visit: Payer: Self-pay | Admitting: *Deleted

## 2018-08-02 ENCOUNTER — Encounter: Payer: Self-pay | Admitting: Oncology

## 2018-08-02 ENCOUNTER — Inpatient Hospital Stay (HOSPITAL_BASED_OUTPATIENT_CLINIC_OR_DEPARTMENT_OTHER): Payer: Medicare Other | Admitting: Oncology

## 2018-08-02 ENCOUNTER — Inpatient Hospital Stay: Payer: Medicare Other

## 2018-08-02 ENCOUNTER — Other Ambulatory Visit: Payer: Self-pay

## 2018-08-02 VITALS — BP 104/68 | HR 67 | Temp 98.4°F | Resp 18 | Wt 123.0 lb

## 2018-08-02 DIAGNOSIS — D702 Other drug-induced agranulocytosis: Secondary | ICD-10-CM

## 2018-08-02 DIAGNOSIS — E538 Deficiency of other specified B group vitamins: Secondary | ICD-10-CM

## 2018-08-02 DIAGNOSIS — C569 Malignant neoplasm of unspecified ovary: Secondary | ICD-10-CM

## 2018-08-02 DIAGNOSIS — E871 Hypo-osmolality and hyponatremia: Secondary | ICD-10-CM | POA: Diagnosis not present

## 2018-08-02 DIAGNOSIS — D539 Nutritional anemia, unspecified: Secondary | ICD-10-CM | POA: Diagnosis not present

## 2018-08-02 DIAGNOSIS — Z5111 Encounter for antineoplastic chemotherapy: Secondary | ICD-10-CM

## 2018-08-02 DIAGNOSIS — Z452 Encounter for adjustment and management of vascular access device: Secondary | ICD-10-CM | POA: Diagnosis not present

## 2018-08-02 DIAGNOSIS — D701 Agranulocytosis secondary to cancer chemotherapy: Secondary | ICD-10-CM | POA: Diagnosis not present

## 2018-08-02 DIAGNOSIS — Z95828 Presence of other vascular implants and grafts: Secondary | ICD-10-CM

## 2018-08-02 LAB — CBC WITH DIFFERENTIAL/PLATELET
Abs Immature Granulocytes: 0.01 10*3/uL (ref 0.00–0.07)
Basophils Absolute: 0 10*3/uL (ref 0.0–0.1)
Basophils Relative: 1 %
Eosinophils Absolute: 0.1 10*3/uL (ref 0.0–0.5)
Eosinophils Relative: 3 %
HCT: 26.9 % — ABNORMAL LOW (ref 36.0–46.0)
Hemoglobin: 9.5 g/dL — ABNORMAL LOW (ref 12.0–15.0)
Immature Granulocytes: 0 %
Lymphocytes Relative: 39 %
Lymphs Abs: 1.1 10*3/uL (ref 0.7–4.0)
MCH: 38.9 pg — ABNORMAL HIGH (ref 26.0–34.0)
MCHC: 35.3 g/dL (ref 30.0–36.0)
MCV: 110.2 fL — ABNORMAL HIGH (ref 80.0–100.0)
Monocytes Absolute: 0.3 10*3/uL (ref 0.1–1.0)
Monocytes Relative: 12 %
Neutro Abs: 1.3 10*3/uL — ABNORMAL LOW (ref 1.7–7.7)
Neutrophils Relative %: 45 %
Platelets: 148 10*3/uL — ABNORMAL LOW (ref 150–400)
RBC: 2.44 MIL/uL — ABNORMAL LOW (ref 3.87–5.11)
RDW: 19.2 % — ABNORMAL HIGH (ref 11.5–15.5)
WBC: 2.8 10*3/uL — ABNORMAL LOW (ref 4.0–10.5)
nRBC: 0 % (ref 0.0–0.2)

## 2018-08-02 LAB — COMPREHENSIVE METABOLIC PANEL
ALT: 8 U/L (ref 0–44)
AST: 16 U/L (ref 15–41)
Albumin: 4.4 g/dL (ref 3.5–5.0)
Alkaline Phosphatase: 64 U/L (ref 38–126)
Anion gap: 6 (ref 5–15)
BUN: 12 mg/dL (ref 8–23)
CO2: 22 mmol/L (ref 22–32)
Calcium: 9.4 mg/dL (ref 8.9–10.3)
Chloride: 104 mmol/L (ref 98–111)
Creatinine, Ser: 0.64 mg/dL (ref 0.44–1.00)
GFR calc Af Amer: >60 mL/min (ref >60)
GFR calc non Af Amer: >60 mL/min (ref >60)
Glucose, Bld: 94 mg/dL (ref 70–99)
Potassium: 3.7 mmol/L (ref 3.5–5.1)
Sodium: 132 mmol/L — ABNORMAL LOW (ref 135–145)
Total Bilirubin: 0.8 mg/dL (ref 0.3–1.2)
Total Protein: 7.1 g/dL (ref 6.5–8.1)

## 2018-08-02 MED ORDER — OLAPARIB 150 MG PO TABS: 300.0000 mg | ORAL_TABLET | Freq: Two times a day (BID) | ORAL | 2 refills | Status: DC

## 2018-08-02 MED ORDER — SODIUM CHLORIDE 0.9% FLUSH
10.0000 mL | INTRAVENOUS | Status: DC | PRN
  Administered 2018-08-02: 10 mL via INTRAVENOUS
  Filled 2018-08-02: qty 10

## 2018-08-02 MED ORDER — HEPARIN SOD (PORK) LOCK FLUSH 100 UNIT/ML IV SOLN
500.0000 [IU] | Freq: Once | INTRAVENOUS | Status: AC
  Administered 2018-08-02: 500 [IU] via INTRAVENOUS
  Filled 2018-08-02: qty 5

## 2018-08-02 NOTE — Progress Notes (Signed)
Patient here for follow up. No concerns voiced. Pt requesting refill for olanzapine

## 2018-08-02 NOTE — Telephone Encounter (Signed)
CBC with Differential/Platelet  Order: [id number]  Status:  Final result  Visible to patient:  No (Not Released)  Next appt:  Today at 12:45 PM in Oncology ([facility])  Dx:  Macrocytic anemia   Ref Range & Units 2wk ago (07/19/18) 4wk ago (07/05/18) 8mo ago (06/21/18) 24mo ago (06/07/18) 65mo ago (05/22/18) 62mo ago (05/17/18) 17mo ago (04/26/18)  WBC 4.0 - 10.5 K/uL 2.6Low   2.4Low   2.3Low   2.8Low   3.1Low   2.7Low   5.3   RBC 3.87 - 5.11 MIL/uL 2.50Low   2.42Low   2.59Low   2.97Low   3.29Low   3.13Low   3.25Low    Hemoglobin 12.0 - 15.0 g/dL 9.4Low   9.0Low   9.2Low   10.0Low   10.5Low   9.9Low   10.2Low    HCT 36.0 - 46.0 % 27.5Low   25.8Low   26.5Low   29.5Low   31.6Low   29.9Low   30.4Low    MCV 80.0 - 100.0 fL 110.0High   106.6High   102.3High   99.3  96.0  95.5  93.5   MCH 26.0 - 34.0 pg 37.6High   37.2High   35.5High   33.7  31.9  31.6  31.4   MCHC 30.0 - 36.0 g/dL 34.2  34.9  34.7  33.9  33.2  33.1  33.6   RDW 11.5 - 15.5 % 19.8High   19.3High   18.1High   16.6High   15.4  15.9High   15.2   Platelets 150 - 400 K/uL 124Low   120Low   132Low   166  120Low   113Low   127Low    nRBC 0.0 - 0.2 % 0.0  0.0  0.0  0.0  0.0  0.0  0.0   Neutrophils Relative % % 57  53  51  57  57  42  52   Neutro Abs 1.7 - 7.7 K/uL 1.5Low   1.3Low   1.2Low   1.6Low   1.8  1.1Low   2.7   Lymphocytes Relative % 30  33  33  30  30  40  33   Lymphs Abs 0.7 - 4.0 K/uL 0.8  0.8  0.8  0.8  0.9  1.1  1.7   Monocytes Relative % 11  10  12  9  10  14  14    Monocytes Absolute 0.1 - 1.0 K/uL 0.3  0.2  0.3  0.3  0.3  0.4  0.8   Eosinophils Relative % 2  4  4  3  1  3   0   Eosinophils Absolute 0.0 - 0.5 K/uL 0.1  0.1  0.1  0.1  0.0  0.1  0.0   Basophils Relative % 0  0  0  1  1  0  0   Basophils Absolute 0.0 - 0.1 K/uL 0.0  0.0  0.0  0.0  0.0  0.0  0.0   Immature Granulocytes % 0  0  0  0  1  1  1    Abs Immature Granulocytes 0.00 - 0.07 K/uL 0.01  0.01 CM 0.01 CM 0.01 CM 0.02 CM 0.02 CM 0.04 CM  Comment: Performed at [facility], [address] [place], [place] [address]  Resulting Agency  [facility] CL[facility] LAB [place] CLIN LAB [place] CL[facility] LAB [place] [facility] [place] [facility] [place] [facility] [place] [facility]      Specimen Collected: 07/19/18 09:11  Last Resulted:  07/19/18 09:22        

## 2018-08-03 LAB — CA 125: Cancer Antigen (CA) 125: 18.7 U/mL (ref 0.0–38.1)

## 2018-08-04 NOTE — Progress Notes (Signed)
[facility] Follow up visit  Patient Care Team: Patient, [name], [name], [name], [name], MD as Referring Physician (Obstetrics and Gynecology)  CHIEF COMPLAINTS/PURPOSE OF Visit Follow up for chemotherapy tolerability of  ovarian cancer.  HISTORY OF PRESENTING ILLNESS: Katie Hill 68 y.o. female presents for follow up of management of stage IIIC ovarian cancer. She underwent  Neoadjuvant chemotherapy of carbo and taxol x 4  # Patient had debulking surgery on 03/14/2017. She had a laparoscopy with conversion to laparotomy, total hysterectomy, with bilateral salpingo oophorectomy, right aortic lymph node dissection, omentectomy. Pathology showed small foci of residual disease in ovary.   Genetic testing negative for83 genes on [facility]'s Multi-Cancer panel (ALK, APC, ATM, AXIN2, BAP1, BARD1, BLM, BMPR1A, BRCA1, BRCA2, BRIP1, CASR, CDC73, CDH1, CDK4, CDKN1B, CDKN1C, CDKN2A, CEBPA, CHEK2, CTNNA1, DICER1, DIS3L2, EGFR, EPCAM, FH, FLCN, GATA2, GPC3, GREM1, HOXB13, HRAS, KIT, MAX, MEN1, MET, MITF, MLH1, MSH2, MSH3, MSH6, MUTYH, NBN, NF1, NF2, NTHL1, PALB2, PDGFRA, PHOX2B, PMS2, POLD1, POLE, POT1, PRKAR1A, PTCH1, PTEN, RAD50, RAD51C, RAD51D, RB1, RECQL4, RET, RUNX1, SDHA, SDHAF2, SDHB, SDHC, SDHD, SMAD4, SMARCA4, SMARCB1, SMARCE1, STK11, SUFU, TERC, TERT, TMEM127, TP53, TSC1, TSC2, VHL, WRN, WT1).  A Variant of UncertainSignificancewas detected: CASRc.106G>A (p.Gly36Arg). Myraid testing negative for somatic BRACA1/2, positive for HRD.   # HRD positive, was referred to [facility] for clinical trials of Olarparib maintenance. She opted out.  #  Treatment:  Stage IIIC Ovarian cancer:  #s/p Carboplatin and taxol x 4 neoadjuvant chemotherapy followed by debulking surgery  03/14/2017 # 03/28/2017 S/p Adjuvant carbo and taxol x3   Local recurrence, CA125 one 46.3 03/15/2018-05/17/2018 Carboplatin and Taxol x 4. CA125 decrease from 49.7 to 6 after 4 cycles of treatment.  Started on Olarparib 372m BID on 05/17/2018.   INTERVAL HISTORY 68y.o. female with above oncology history reviewed by me today presents for evaluation for chemotherapy for treatment of Ovarian cancer  tolerability of olaparib. She denies any nausea, vomiting, diarrhea.  Tolerates well.  Denies any pain.    Review of Systems  Constitutional: Positive for fatigue. Negative for appetite change, chills and fever.  HENT:   Negative for hearing loss and voice change.   Eyes: Negative for eye problems.  Respiratory: Negative for chest tightness and cough.   Cardiovascular: Negative for chest pain.  Gastrointestinal: Negative for abdominal distention, abdominal pain and blood in stool.  Endocrine: Negative for hot flashes.  Genitourinary: Negative for bladder incontinence, difficulty urinating, frequency and hematuria.   Musculoskeletal: Negative for arthralgias.  Skin: Negative for itching and rash.  Neurological: Negative for extremity weakness.  Hematological: Negative for adenopathy.  Psychiatric/Behavioral: Negative for confusion.   MEDICAL HISTORY: Past Medical History:  Diagnosis Date  . Dysrhythmia   . Genetic testing 03/28/2017   Multi-Cancer panel (83 genes) @ [facility] - No pathogenic mutations detected  . High grade ovarian cancer ([place] 11/20/2016  . Pelvic mass in female     SURGICAL HISTORY: Past Surgical History:  Procedure Laterality Date  . APPENDECTOMY    . LAPAROSCOPY N/A 03/14/2017   Procedure: LAPAROSCOPY OPERATIVE;  Surgeon: BMellody Drown MD;  Location: [facility];  Service: Gynecology;  Laterality: N/A;  . LAPAROTOMY N/A 03/14/2017   Procedure: LAPAROTOMY;  Surgeon: BMellody Drown MD;  Location: [facility];  Service: Gynecology;  Laterality: N/A;  . LYMPH  NODE  DISSECTION N/A 03/14/2017   Procedure: LYMPH NODE DISSECTION;  Surgeon: Mellody Drown, MD;  Location: [facility];  Service: Gynecology;  Laterality: N/A;  . OMENTECTOMY N/A 03/14/2017   Procedure: OMENTECTOMY;  Surgeon: Mellody Drown, MD;  Location: [facility];  Service: Gynecology;  Laterality: N/A;  . PORTA CATH INSERTION N/A 11/27/2016   Procedure: Glori Luis Cath Insertion;  Surgeon: Algernon Huxley, MD;  Location: [facility];  Service: Cardiovascular;  Laterality: N/A;    SOCIAL HISTORY: Social History   Socioeconomic History  . Marital status: Married    Spouse name: Not on file  . Number of children: Not on file  . Years of education: Not on file  . Highest education level: Not on file  Occupational History  . Not on file  Social Needs  . Financial resource strain: Not on file  . Food insecurity:    Worry: Not on file    Inability: Not on file  . Transportation needs:    Medical: Not on file    Non-medical: Not on file  Tobacco Use  . Smoking status: Never Smoker  . Smokeless tobacco: Never Used  Substance and Sexual Activity  . Alcohol use: No    Frequency: Never    Comment: use to be occ. but not any since 5 months  . Drug use: No  . Sexual activity: Yes    Birth control/protection: Post-menopausal  Lifestyle  . Physical activity:    Days per week: Not on file    Minutes per session: Not on file  . Stress: Not on file  Relationships  . Social connections:    Talks on phone: Not on file    Gets together: Not on file    Attends religious service: Not on file    Active member of club or organization: Not on file    Attends meetings of clubs or organizations: Not on file    Relationship status: Not on file  . Intimate partner violence:    Fear of current or ex partner: Not on file    Emotionally abused: Not on file    Physically abused: Not on file    Forced sexual activity: Not on file  Other Topics Concern  . Not on file  Social History Narrative  .  Not on file    FAMILY HISTORY Family History  Problem Relation Age of Onset  . Throat cancer Cousin   . Throat cancer Cousin   . Leukemia Cousin     ALLERGIES:  is allergic to omeprazole.  MEDICATIONS:  Current Outpatient Medications  Medication Sig Dispense Refill  . calcium carbonate (TUMS - DOSED IN MG ELEMENTAL CALCIUM) 500 MG chewable tablet Chew 1 tablet by mouth as needed for indigestion or heartburn.    . lidocaine-prilocaine (EMLA) cream Apply 1 application topically as needed. Apply small amount to port site at least 1 hour prior to it being accessed, cover with plastic wrap 30 g 1  . loperamide (IMODIUM) 2 MG capsule Take 1 capsule (2 mg total) by mouth See admin instructions. With onset of loose stool, take '4mg'$  followed by '2mg'$  every 2 hours until 12 hours have passed without loose bowel movement. Maximum: 16 mg/day 120 capsule 1  . olaparib (LYNPARZA) 150 MG tablet Take 2 tablets (300 mg total) by mouth 2 (two) times daily. 120 tablet 2  . ondansetron (ZOFRAN) 8 MG tablet Take 1 tablet (8 mg total) by mouth 2 (two) times daily as needed for  refractory nausea / vomiting. Start on day 3 after chemo. 30 tablet 1  . prochlorperazine (COMPAZINE) 10 MG tablet Take 1 tablet (10 mg total) by mouth every 6 (six) hours as needed (Nausea or vomiting). 30 tablet 1  . Tetrahydrozoline HCl (REDNESS RELIEVER EYE DROPS OP) Place 1 drop into both eyes as needed (for red eyes).     . vitamin B-12 (CYANOCOBALAMIN) 1000 MCG tablet Take 1 tablet (1,000 mcg total) by mouth daily. 90 tablet 1  . dexamethasone (DECADRON) 4 MG tablet Take 2 tablets (8 mg total) by mouth daily. Take 2 days before and 2 days after chemotherapy (Patient not taking: Reported on 06/07/2018) 60 tablet 1   No current facility-administered medications for this visit.     PHYSICAL EXAMINATION:  ECOG PERFORMANCE STATUS: 0 - Asymptomatic Vitals:   08/02/18 1314  BP: 104/68  Pulse: 67  Resp: 18  Temp: 98.4 F (36.9 C)     Filed Weights   08/02/18 1314  Weight: 123 lb (55.8 kg)     Physical Exam  Constitutional: She is oriented to person, place, and time. No distress.  HENT:  Head: Normocephalic and atraumatic.  Nose: Nose normal.  Mouth/Throat: Oropharynx is clear and moist. No oropharyngeal exudate.  Eyes: Pupils are equal, round, and reactive to light. EOM are normal. No scleral icterus.  Neck: Normal range of motion. Neck supple. No JVD present.  Cardiovascular: Normal rate and regular rhythm.  No murmur heard. Pulmonary/Chest: Effort normal and breath sounds normal. No respiratory distress. She has no rales. She exhibits no tenderness.  Abdominal: Soft. Bowel sounds are normal. She exhibits no distension. There is no abdominal tenderness.  Musculoskeletal: Normal range of motion.        General: No edema.  Lymphadenopathy:    She has no cervical adenopathy.  Neurological: She is alert and oriented to person, place, and time. No cranial nerve deficit. She exhibits normal muscle tone. Coordination normal.  Skin: Skin is warm and dry. She is not diaphoretic. No erythema.  Right anterior medi port + 0.5cm raised erythematous skin lesion on anterior chest wall.   Psychiatric: Affect and judgment normal.       SKIN: LABORATORY DATA: I have personally reviewed the data as listed: CBC    Component Value Date/Time   WBC 2.8 (L) 08/02/2018 1243   RBC 2.44 (L) 08/02/2018 1243   HGB 9.5 (L) 08/02/2018 1243   HCT 26.9 (L) 08/02/2018 1243   PLT 148 (L) 08/02/2018 1243   MCV 110.2 (H) 08/02/2018 1243   MCH 38.9 (H) 08/02/2018 1243   MCHC 35.3 08/02/2018 1243   RDW 19.2 (H) 08/02/2018 1243   LYMPHSABS 1.1 08/02/2018 1243   MONOABS 0.3 08/02/2018 1243   EOSABS 0.1 08/02/2018 1243   BASOSABS 0.0 08/02/2018 1243   CMP Latest Ref Rng & Units 08/02/2018 07/05/2018 06/21/2018  Glucose 70 - 99 mg/dL 94 97 94  BUN 8 - 23 mg/dL _0 Creatinine 0.44 - 1.00 mg/dL 0.64 0.74 0.73  Sodium 135 - 145  mmol/L 132(L) 133(L) 133(L)  Potassium 3.5 - 5.1 mmol/L 3.7 3.9 3.8  Chloride 98 - 111 mmol/L 104 103 104  CO2 22 - 32 mmol/L _1 Calcium 8.9 - 10.3 mg/dL 9.4 9.7 9.5  Total Protein 6.5 - 8.1 g/dL 7.1 6.7 6.7  Total Bilirubin 0.3 - 1.2 mg/dL 0.8 0.6 0.6  Alkaline Phos 38 - 126 U/L 64 57 56  AST 15 - 41 U/L  16 14(L) 14(L)  ALT 0 - 44 U/L _0 pathology 11/16/2016 Surgical Pathology  CASE: [id number]  PATIENT: Jeilani Remund  Surgical Pathology Report   SPECIMEN SUBMITTED:  A. Retroperitoneal adenopathy, left  DIAGNOSIS:  A. LYMPH NODE, LEFT RETROPERITONEAL; CT-GUIDED CORE BIOPSY:  - METASTATIC HIGH-GRADE SEROUS CARCINOMA.   RADIOGRAPHIC STUDIES: I have personally reviewed the radiological images as listed and agree with the findings in the report CT chest abdomen pelvis with contrast 11/13/2016 IMPRESSION: 1. Large heterogeneous central pelvic mass measures up to 12.6 cm. The lesion generates substantial mass-effect on the uterus, bladder, and pelvic sidewall anatomy. The mass appears to invade the mid sigmoid colon. Etiology of the central pelvic mass is not definitive by CT, but ovarian primary is suspected. The lesion becomes indistinguishable from the posterior uterus on some images and uterine origin is possible, but the uterus does not appear to be the epicenter of the mass and appears to be more displaced by it. MRI of the pelvis without and with contrast may prove helpful to better delineate the relationship of the uterus to the central pelvic mass although it may not be able to definitively localize the origin. 2. Bulky retroperitoneal and left pelvic sidewall lymphadenopathy. The retroperitoneal lymphadenopathy generates substantial mass-effect on the IVC and left renal vein. The external iliac veins along each pelvic sidewall are markedly attenuated by the mass/lymphadenopathy. Patency of these vessels cannot be definitely confirmed on this exam.  Pathology  03/14/2017   DIAGNOSIS:  A. OMENTUM; OMENTECTOMY:  - NO TUMOR SEEN.  - ONE NEGATIVE LYMPH NODE (0/1).   B. RIGHT FALLOPIAN TUBE AND OVARY; SALPINGO-OOPHORECTOMY:  - SMALL FOCI OF HIGH GRADE SEROUS CARCINOMA INVOLVING THE OVARY.  - MARKED THERAPY RELATED CHANGE.  - NO TUMOR SEEN IN THE FALLOPIAN TUBE.   C. UTERUS, CERVIX, LEFT FALLOPIAN TUBE AND OVARY; HYSTERECTOMY AND LEFT  SALPINGO-OOPHORECTOMY:  - NABOTHIAN CYSTS.  - CYSTIC ATROPHY OF THE ENDOMETRIUM.  - SEROSAL ADHESIONS.  - UNREMARKABLE FALLOPIAN TUBE.  - OVARY SHOWING TREATMENT RELATED CHANGE.   D. 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   Results for JERIKA, WALES" (MRN [id number]) as of 03/04/2018 16:52  Ref. Range 11/16/2016 08:47 12/25/2016 09:26 01/15/2017 09:05 02/05/2017 10:07 02/28/2017 13:35 03/28/2017 08:27 05/09/2017 08:35 07/04/2017 14:49 08/27/2017 09:45 11/09/2017 11:14 12/24/2017 11:25 02/18/2018 14:48  Cancer Antigen (CA) 125 Latest Ref Range: 0.0 - 38.1 U/mL 4,382.0 (H) 699.1 (H) 207.6 (H) 64.6 (H) 36.9 31.9 7.4 11.3 14.1 21.0 25.7 46.3 (H)    RADIOGRAPHIC STUDIES: I have personally reviewed the radiological images as listed and agreed with the findings in the report. 02/28/2018 CT chest abdomen pelvis showed interval enlargement off and aorto caval lymph node which currently measures 1.4 cm in short axis.  This lymph node is in close proximity to the previously resected retroperitoneal lymphadenopathy and it was a concern for additional nodal metastasis.Small lung nodules stable.  ASSESSMENT/PLAN Cancer Staging Malignant neoplasm of ovary [facility]) Staging form: Ovary, Fallopian Tube, and Primary Peritoneal Carcinoma, AJCC 8th Edition - Clinical stage from 11/22/2016: Stage IIIC (cT3c, cN1b, cM0) - Signed by Earlie Server, MD on 11/22/2016  1. Malignant neoplasm of ovary, unspecified laterality ([place])   2. High grade ovarian cancer ([place])    3. Encounter for antineoplastic chemotherapy   4. Macrocytic anemia   5. Hyponatremia   6. Drug-induced neutropenia ([place])   : # Ovarian Cancer, local recurrence.  Tolerating  maintenance olaparib 300 mg twice daily  Continue.   CA125 gradually trending up to 18.7.  Obtain CT for evaluation   #Drug-induced neutropenia, ANC 1.3.  Continue to monitor #Macrocytic anemia.  Due to olarparib and vitamin b12 deficiency. Vitamin B2 deficiency is new. Started on vitamin B12 1059mg daily.   # Medi port will need to be flushed every 6 weeks.  Patient is aware about that. #Hyponatremia, chronic. Sodium is stable. Continue to monitor  Labs in 2 weeks.  RTF in 4 weeks with labs MD assessment.  Orders Placed This Encounter  Procedures  . CT Chest W Contrast    Standing Status:   Future    Standing Expiration Date:   08/02/2019    Order Specific Question:   If indicated for the ordered procedure, I authorize the administration of contrast media per Radiology protocol    Answer:   Yes    Order Specific Question:   Preferred imaging location?    Answer:   [facility]    Order Specific Question:   Radiology Contrast Protocol - do NOT remove file path    Answer:   [email or web address]  . CT Abdomen Pelvis W Contrast    Standing Status:   Future    Standing Expiration Date:   08/02/2019    Order Specific Question:   ** REASON FOR EXAM (FREE TEXT)    Answer:   ovarian cancer    Order Specific Question:   If indicated for the ordered procedure, I authorize the administration of contrast media per Radiology protocol    Answer:   Yes    Order Specific Question:   Preferred imaging location?    Answer:   [facility]    Order Specific Question:   Is Oral Contrast requested for this exam?    Answer:   Yes, Per Radiology protocol    Order Specific Question:   Radiology Contrast Protocol - do NOT remove file path    Answer:    [email or web address]     ZEarlie Server MD, PhD Hematology Oncology CAdventist Health Lodi Memorial Hospitalat AWheeling HospitalPager- [phone]/26/20

## 2018-08-13 IMAGING — CT CT CHEST W/ CM
2 of 5 series · 11 of 36 positions shown, 13 images · IV contrast (iopamidol)
Comparison: Noncontrast abdomen and pelvis CT from earlier today.

CLINICAL DATA: Right side abdominal pain for 3 weeks.
Intraabdominal abnormality seen on noncontrast CT earlier today.

EXAM:
CT CHEST, ABDOMEN, AND PELVIS WITH CONTRAST
TECHNIQUE: Multidetector CT imaging of the chest, abdomen and pelvis was
performed following the standard protocol during bolus
administration of intravenous contrast.
CONTRAST:  75mL [id number] IOPAMIDOL ([id number]) INJECTION 61%

[Series 2: cap with · axial · 0.69mm/px · z∈[-599,-79]mm · 8 of 131 slices shown, 10 images]
[im 14/131  mediastinal]
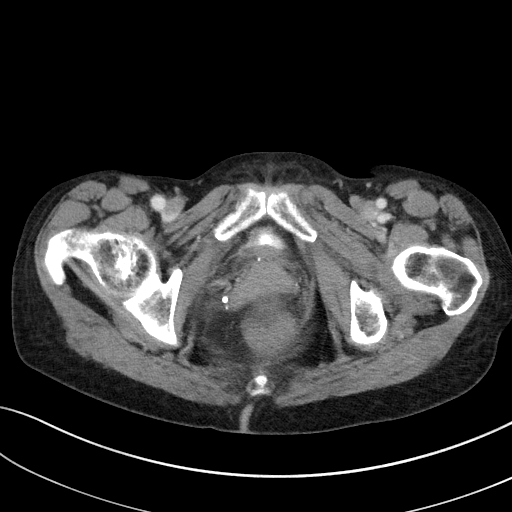
[im 14/131  lung]
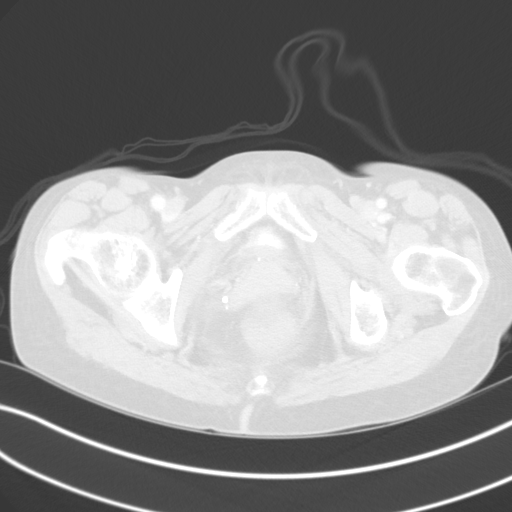
[im 27/131  lung]
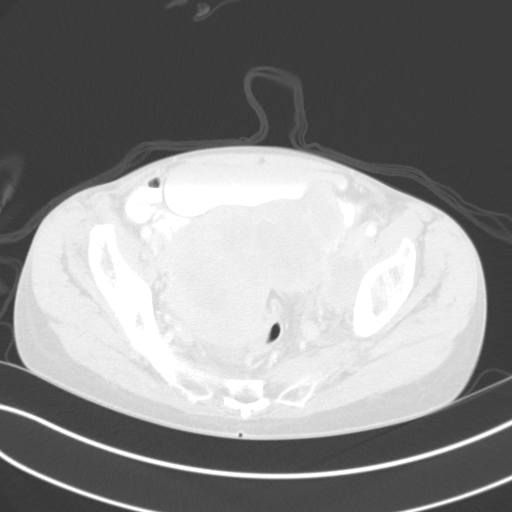
[im 40/131  lung]
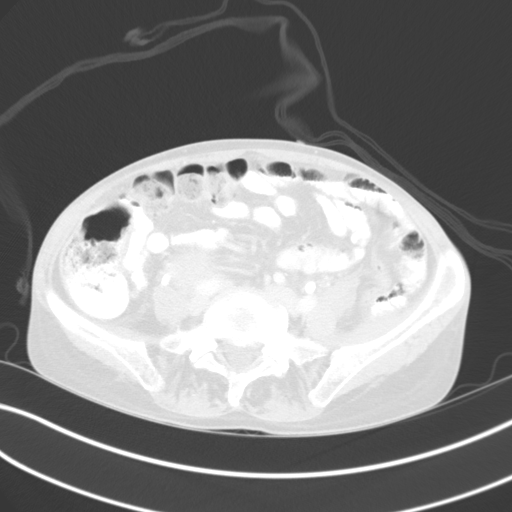
[im 53/131  lung]
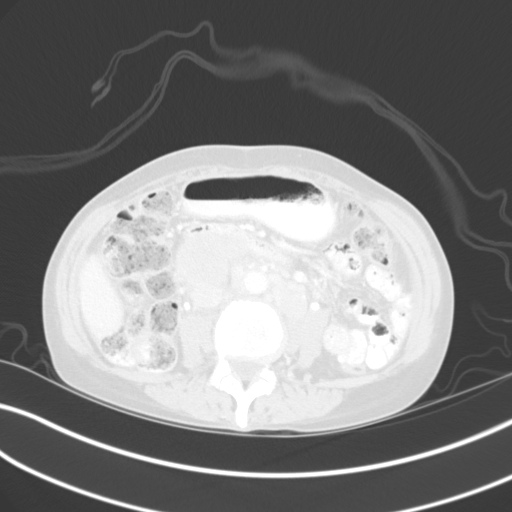
[im 79/131  mediastinal]
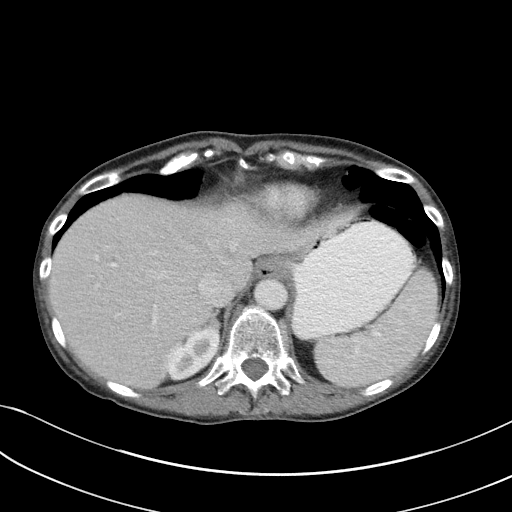
[im 79/131  lung]
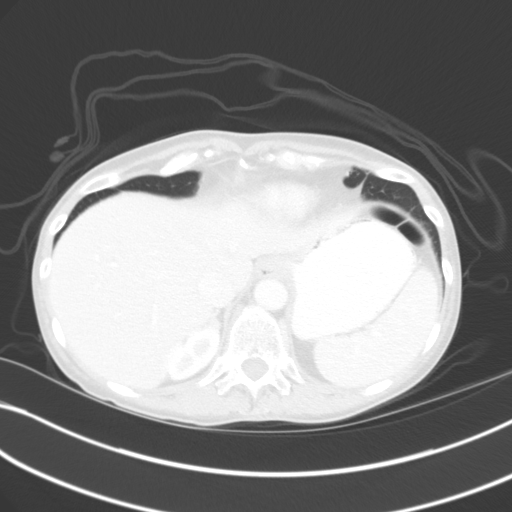
[im 92/131  lung]
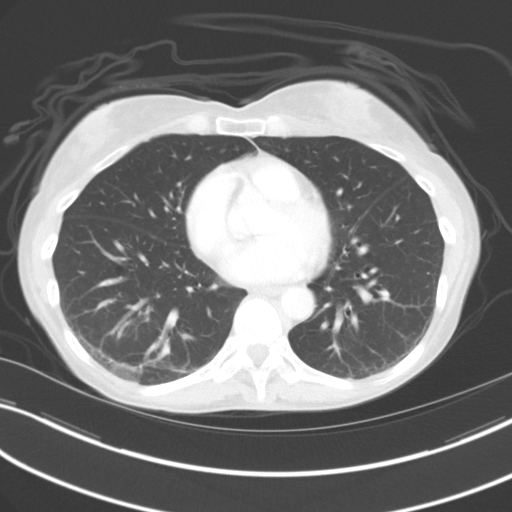
[im 105/131  lung]
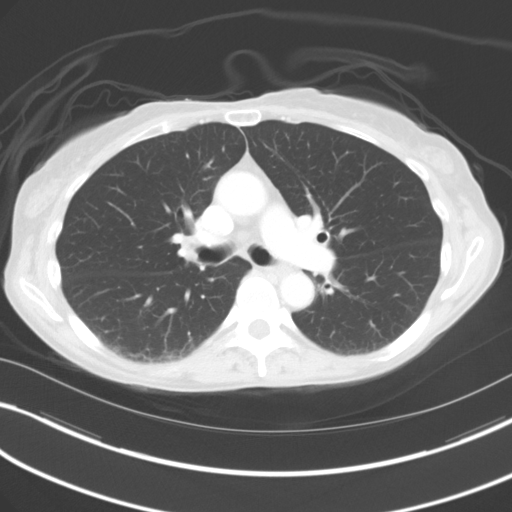
[im 118/131  lung]
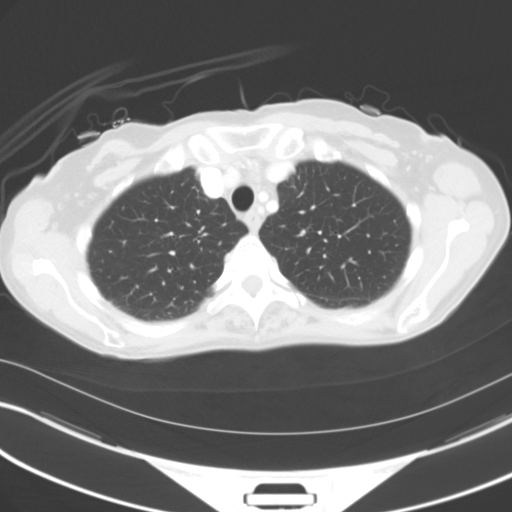

[Series 6: coronals · coronal · 0.86mm/px · 3 of 107 slices shown]
[im 22/107  lung]
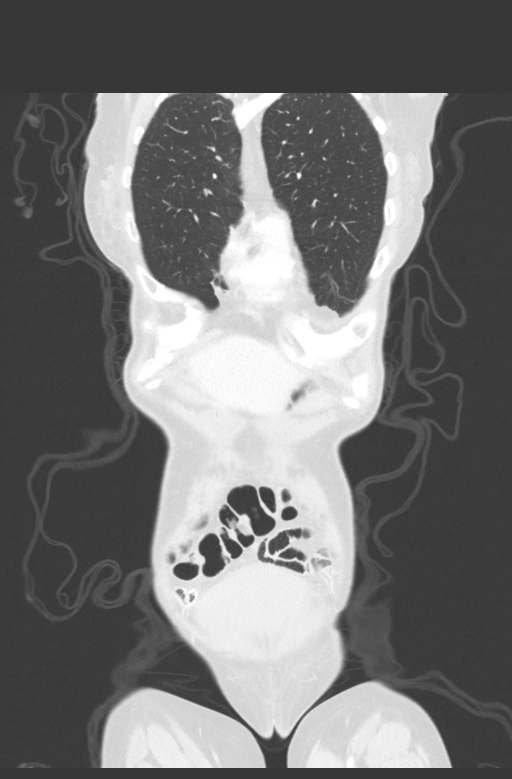
[im 43/107  lung]
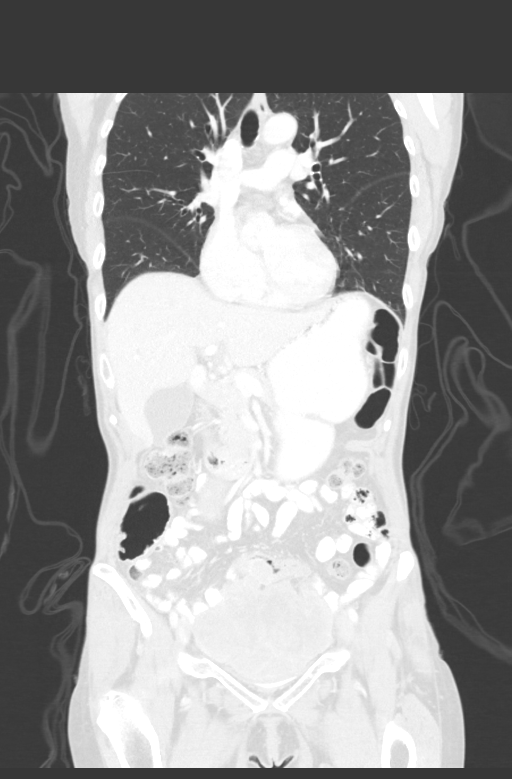
[im 64/107  lung]
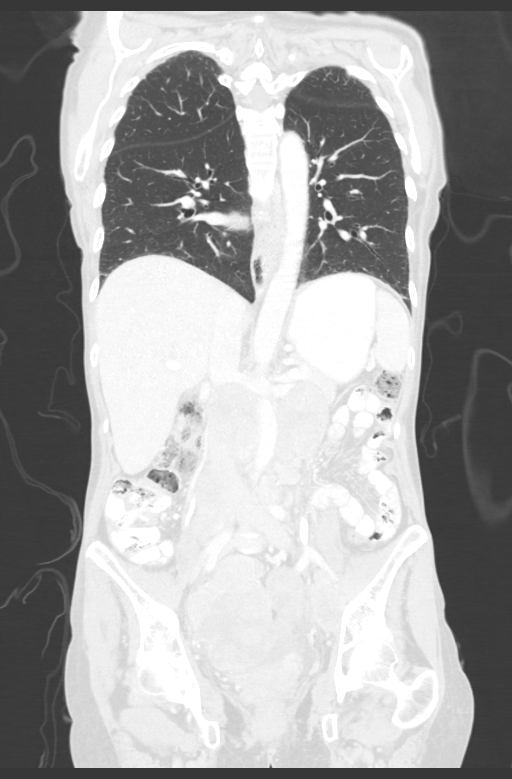

[11 of 36 positions shown; findings below may reference images not displayed]

FINDINGS: CT CHEST FINDINGS

Cardiovascular: The heart size is normal. No pericardial effusion.
No thoracic aortic aneurysm.

Mediastinum/Nodes: No mediastinal lymphadenopathy. There is no hilar
lymphadenopathy. There is no axillary lymphadenopathy. The esophagus
has normal imaging features.

Lungs/Pleura: Biapical pleural-parenchymal scarring evident.
Atelectasis noted in the dependent lung bases. 7 mm left lower lobe
pulmonary nodule seen on image 89. Several tiny subpleural nodules
are seen in the extreme right lung base (image 115 series 4).

Musculoskeletal: Bone windows reveal no worrisome lytic or sclerotic
osseous lesions.

CT ABDOMEN PELVIS FINDINGS

Hepatobiliary: No focal abnormality within the liver parenchyma.
There is no evidence for gallstones, gallbladder wall thickening, or
pericholecystic fluid. No intrahepatic or extrahepatic biliary
dilation.

Pancreas: No focal mass lesion. No dilatation of the main duct. No
intraparenchymal cyst. No peripancreatic edema.

Spleen: No splenomegaly. No focal mass lesion.

Adrenals/Urinary Tract: No adrenal nodule or mass. 5 mm low-density
cortical lesion interpolar right kidney likely a cyst. Left kidney
unremarkable. Ureters are unremarkable. Bladder is displaced by the
large central pelvic mass described below.

Stomach/Bowel: Stomach is nondistended. No gastric wall thickening.
No evidence of outlet obstruction. Duodenum is normally positioned
as is the ligament of Treitz. No small bowel wall thickening. No
small bowel dilatation. The terminal ileum is normal. The appendix
is not visualized, but there is no edema or inflammation in the
region of the cecum. Colon is unremarkable except for the mid
sigmoid segment that is draped across the cranial aspect of the
large central pelvic mass. In this region there is focal wall
thickening and imaging features are suspicious for invasion of the
colonic wall in the mid sigmoid colon (see images 43 and 44 of
coronal series 6 which correspond to image 96 and 97 of axial series
2).

Vascular/Lymphatic: No abdominal aortic aneurysm. Bulky necrotic
retroperitoneal lymphadenopathy is evident. 5.3 x 4.4 cm right
para-aortic nodal mass markedly displaces the IVC posteriorly,
generating prominent mass-effect on the vessel. Left para-aortic
nodal conglomeration just caudal to the left renal vein measures
x 3.7 cm. 4.3 x 4.8 cm necrotic right common iliac lymph node is
evident. Numerous other smaller retroperitoneal lymph nodes are
evident. There is a necrotic nodal mass in the left pelvic sidewall
measuring 4.8 x 2.6 cm on image 103 of series 2. The right external
iliac vein is markedly attenuated along the right pelvic sidewall
and patency of this vessel cannot be confirmed by CT. Similar marked
attenuation of the left common iliac vein noted due to mass-effect
from the central pelvic mass and the left pelvic sidewall
lymphadenopathy. 2.2 cm necrotic left internal iliac lymph node is
visualized on image 93.

Reproductive: 9.6 x 11.2 x 12.6 cm heterogeneous cystic and solid or
solid necrotic mass identified in the central pelvis. Axial imaging
suggests at the left gonadal vessels track into the left lateral
aspect of this lesion raising the question of left ovarian origin.
The right gonadal veins are less clearly associated with this lesion
although the lesion does appear more bulky in the right anatomic
pelvis. Difficult to assess on sagittal reformations, but while this
central pelvic mass generates substantial mass-effect on the uterus,
some images suggest that it is separate from the uterus.

Other: Small amount of free fluid is identified in the pelvis.

Musculoskeletal: Bone windows reveal no worrisome lytic or sclerotic
osseous lesions.
IMPRESSION: 1. Large heterogeneous central pelvic mass measures up to 12.6 cm.
The lesion generates substantial mass-effect on the uterus, bladder,
and pelvic sidewall anatomy. The mass appears to invade the mid
sigmoid colon. Etiology of the central pelvic mass is not definitive
by CT, but ovarian primary is suspected. The lesion becomes
indistinguishable from the posterior uterus on some images and
uterine origin is possible, but the uterus does not appear to be the
epicenter of the mass and appears to be more displaced by it. MRI of
the pelvis without and with contrast may prove helpful to better
delineate the relationship of the uterus to the central pelvic mass
although it may not be able to definitively localize the origin.
2. Bulky retroperitoneal and left pelvic sidewall lymphadenopathy.
The retroperitoneal lymphadenopathy generates substantial
mass-effect on the IVC and left renal vein. The external iliac veins
along each pelvic sidewall are markedly attenuated by the
mass/lymphadenopathy. Patency of these vessels cannot be definitely
confirmed on this exam.

## 2018-08-16 ENCOUNTER — Encounter (INDEPENDENT_AMBULATORY_CARE_PROVIDER_SITE_OTHER): Payer: Self-pay

## 2018-08-16 ENCOUNTER — Other Ambulatory Visit: Payer: Self-pay

## 2018-08-16 ENCOUNTER — Inpatient Hospital Stay: Payer: Medicare Other | Attending: Oncology

## 2018-08-16 DIAGNOSIS — E871 Hypo-osmolality and hyponatremia: Secondary | ICD-10-CM | POA: Insufficient documentation

## 2018-08-16 DIAGNOSIS — D701 Agranulocytosis secondary to cancer chemotherapy: Secondary | ICD-10-CM | POA: Insufficient documentation

## 2018-08-16 DIAGNOSIS — D539 Nutritional anemia, unspecified: Secondary | ICD-10-CM | POA: Diagnosis not present

## 2018-08-16 DIAGNOSIS — C561 Malignant neoplasm of right ovary: Secondary | ICD-10-CM | POA: Diagnosis not present

## 2018-08-16 DIAGNOSIS — C569 Malignant neoplasm of unspecified ovary: Secondary | ICD-10-CM

## 2018-08-16 LAB — COMPREHENSIVE METABOLIC PANEL
ALT: 9 U/L (ref 0–44)
AST: 16 U/L (ref 15–41)
Albumin: 4.5 g/dL (ref 3.5–5.0)
Alkaline Phosphatase: 58 U/L (ref 38–126)
Anion gap: 6 (ref 5–15)
BUN: 11 mg/dL (ref 8–23)
CO2: 23 mmol/L (ref 22–32)
Calcium: 9.7 mg/dL (ref 8.9–10.3)
Chloride: 105 mmol/L (ref 98–111)
Creatinine, Ser: 0.74 mg/dL (ref 0.44–1.00)
GFR calc Af Amer: >60 mL/min (ref >60)
GFR calc non Af Amer: >60 mL/min (ref >60)
Glucose, Bld: 109 mg/dL — ABNORMAL HIGH (ref 70–99)
Potassium: 4.3 mmol/L (ref 3.5–5.1)
Sodium: 134 mmol/L — ABNORMAL LOW (ref 135–145)
Total Bilirubin: 0.8 mg/dL (ref 0.3–1.2)
Total Protein: 7.1 g/dL (ref 6.5–8.1)

## 2018-08-16 LAB — CBC WITH DIFFERENTIAL/PLATELET
Abs Immature Granulocytes: 0.01 10*3/uL (ref 0.00–0.07)
Basophils Absolute: 0 10*3/uL (ref 0.0–0.1)
Basophils Relative: 1 %
Eosinophils Absolute: 0.1 10*3/uL (ref 0.0–0.5)
Eosinophils Relative: 3 %
HCT: 26.9 % — ABNORMAL LOW (ref 36.0–46.0)
Hemoglobin: 9.3 g/dL — ABNORMAL LOW (ref 12.0–15.0)
Immature Granulocytes: 0 %
Lymphocytes Relative: 39 %
Lymphs Abs: 1 10*3/uL (ref 0.7–4.0)
MCH: 39.7 pg — ABNORMAL HIGH (ref 26.0–34.0)
MCHC: 34.6 g/dL (ref 30.0–36.0)
MCV: 115 fL — ABNORMAL HIGH (ref 80.0–100.0)
Monocytes Absolute: 0.3 10*3/uL (ref 0.1–1.0)
Monocytes Relative: 10 %
Neutro Abs: 1.3 10*3/uL — ABNORMAL LOW (ref 1.7–7.7)
Neutrophils Relative %: 47 %
Platelets: 137 10*3/uL — ABNORMAL LOW (ref 150–400)
RBC: 2.34 MIL/uL — ABNORMAL LOW (ref 3.87–5.11)
RDW: 18.5 % — ABNORMAL HIGH (ref 11.5–15.5)
WBC: 2.7 10*3/uL — ABNORMAL LOW (ref 4.0–10.5)
nRBC: 0 % (ref 0.0–0.2)

## 2018-08-16 IMAGING — CT CT BIOPSY
1 of 3 series · 13 of 32 positions shown, 18 images · non-contrast
Comparison: none

INDICATION: 65-year-old female with newly diagnosed large pelvic mass and
extensive retroperitoneal adenopathy concerning for either a primary
gynecologic malignancy with metastatic nodal disease, or lymphoma.

[Series 2: i-spiral 5.0 b30f · axial · 0.64mm/px · z∈[+1062,+1248]mm · 13 of 61 slices shown, 18 images]
[im 4/61  soft-tissue]
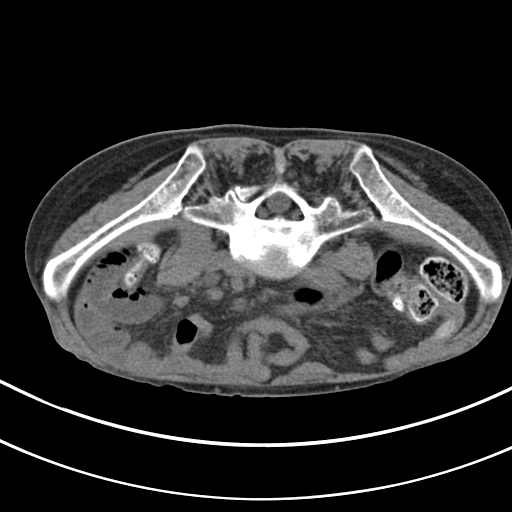
[im 4/61  bone]
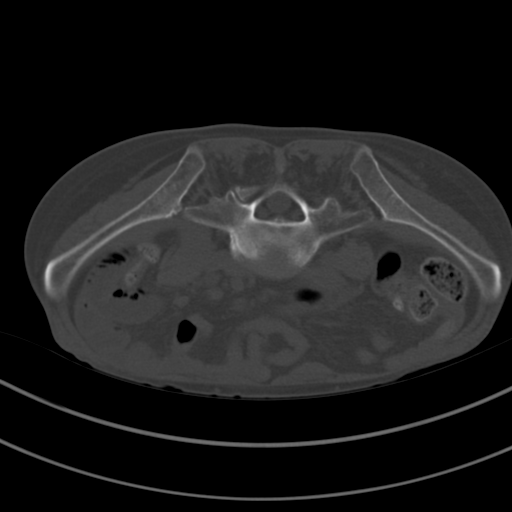
[im 11/61  soft-tissue]
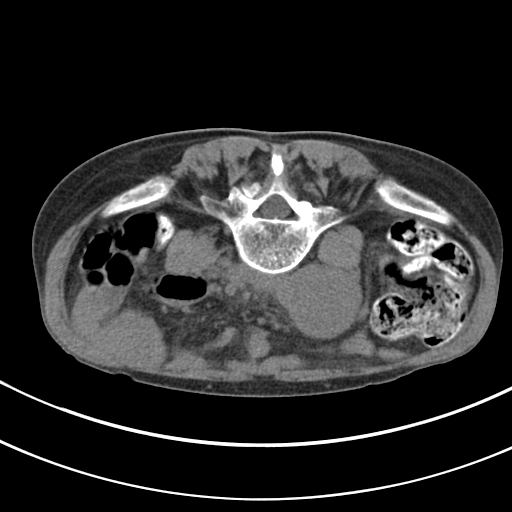
[im 15/61  soft-tissue]
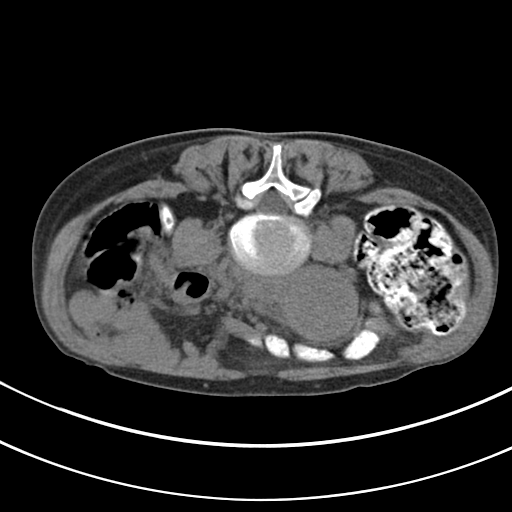
[im 18/61  soft-tissue]
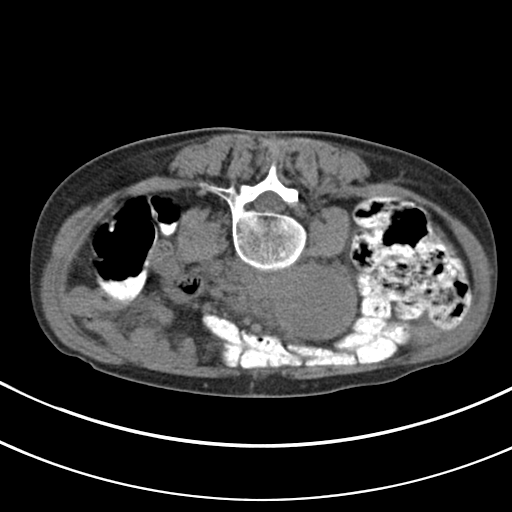
[im 25/61  soft-tissue]
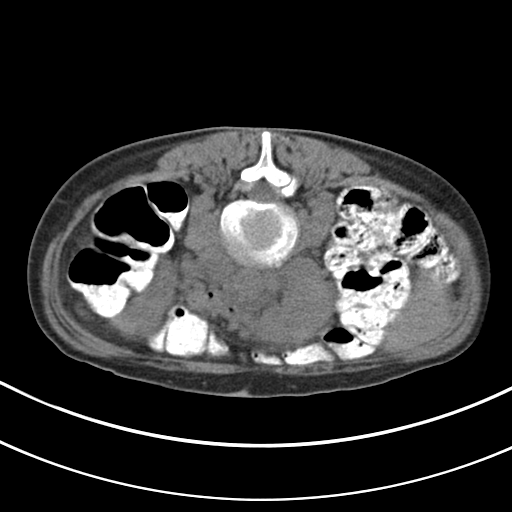
[im 29/61  soft-tissue]
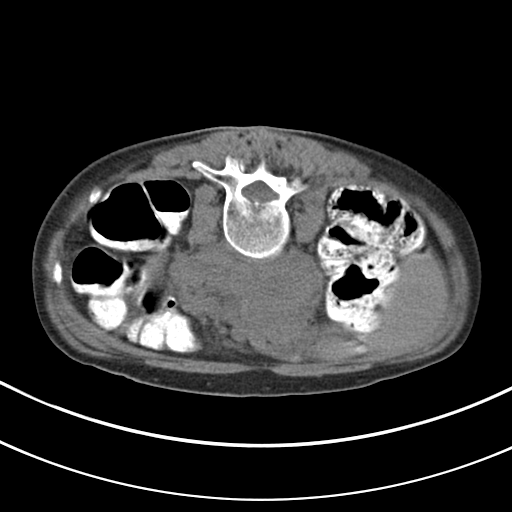
[im 32/61  soft-tissue]
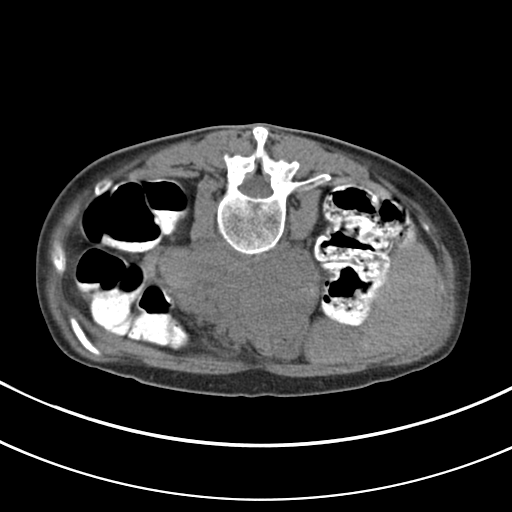
[im 39/61  soft-tissue]
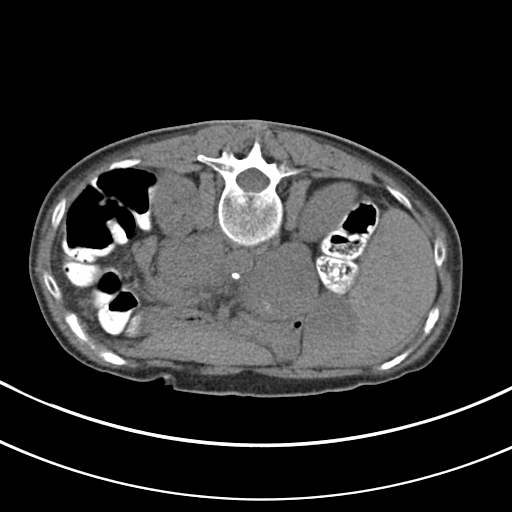
[im 43/61  soft-tissue]
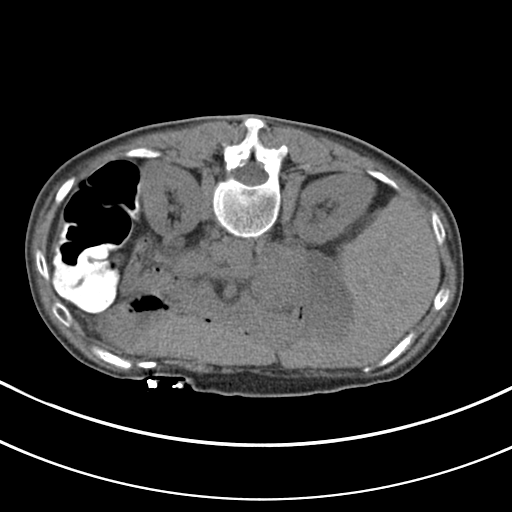
[im 43/61  bone]
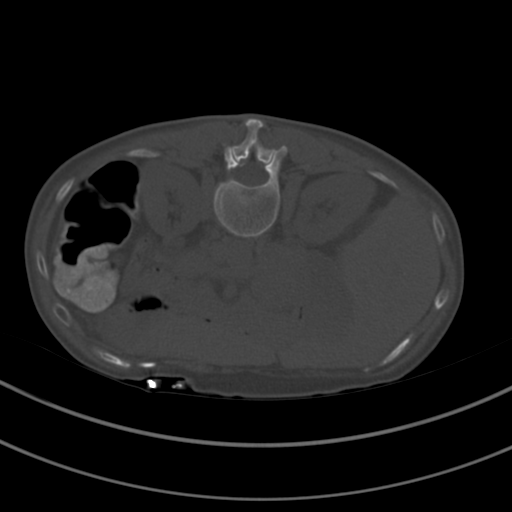
[im 46/61  soft-tissue]
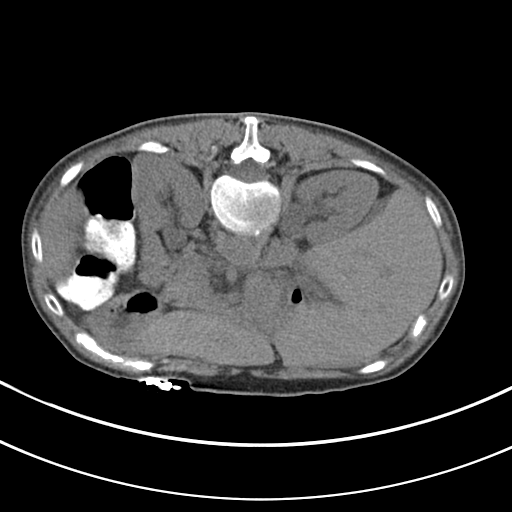
[im 46/61  lung]
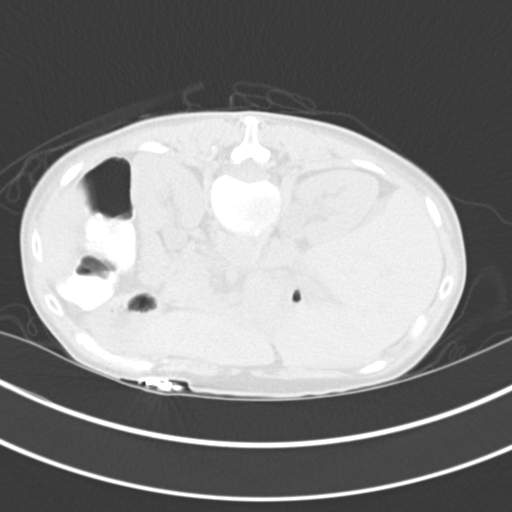
[im 50/61  lung]
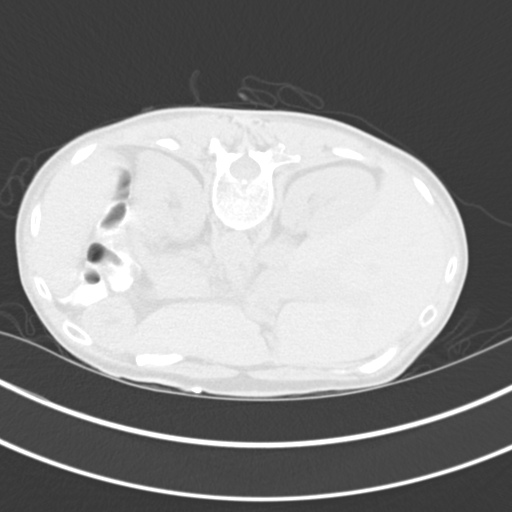
[im 53/61  soft-tissue]
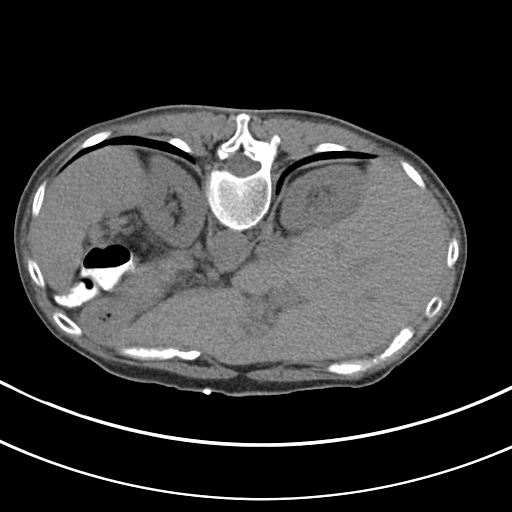
[im 53/61  lung]
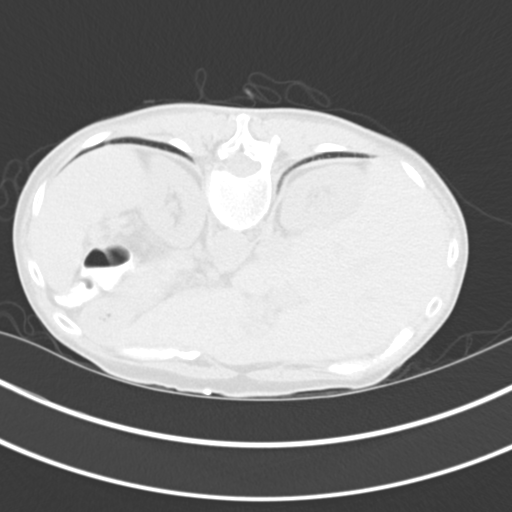
[im 57/61  soft-tissue]
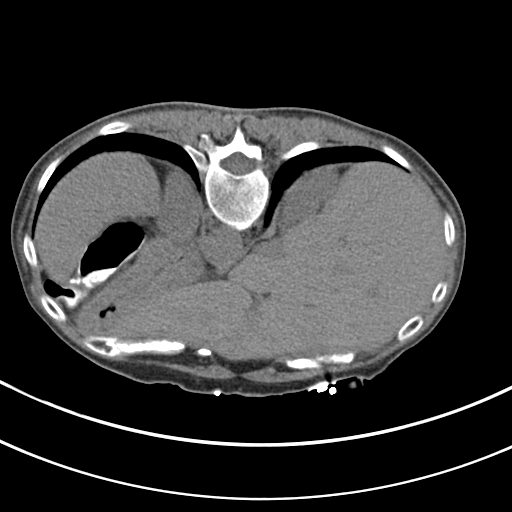
[im 57/61  lung]
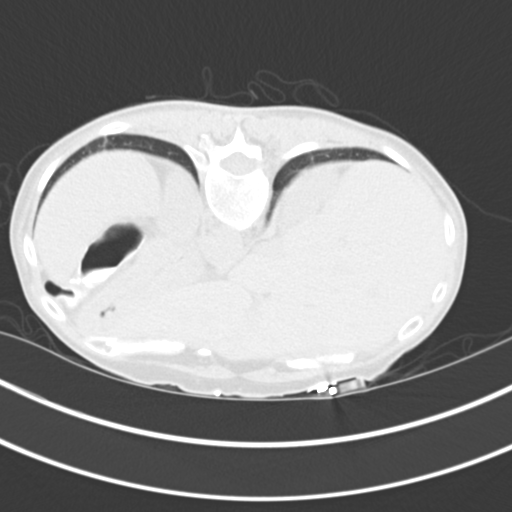

[13 of 32 positions shown; findings below may reference images not displayed]

EXAM:
CT BIOPSY

MEDICATIONS:
None.

ANESTHESIA/SEDATION:
Moderate (conscious) sedation was employed during this procedure. A
total of Versed 2 mg and Fentanyl 100 mcg was administered
intravenously.

Moderate Sedation Time: 13 minutes. The patient's level of
consciousness and vital signs were monitored continuously by
radiology nursing throughout the procedure under my direct
supervision.

FLUOROSCOPY TIME:  Fluoroscopy Time: 0 minutes 0 seconds (0 mGy).

COMPLICATIONS:
None immediate.

PROCEDURE:
Informed written consent was obtained from the patient after a
thorough discussion of the procedural risks, benefits and
alternatives. All questions were addressed. A timeout was performed
prior to the initiation of the procedure.

A planning axial CT scan was performed. The extensive left
para-aortic lymphadenopathy was successfully identified. A suitable
skin entry site was selected and marked. The overlying skin was
sterilely prepped and draped in the standard fashion using
chlorhexidine skin prep. Local anesthesia was attained by
infiltration with 1% lidocaine. A small dermatotomy was made. Under
intermittent CT guidance, a 17 gauge introducer needle was advanced
through the left paraspinal musculature and into the retroperitoneal
adenopathy. Multiple 18 gauge core biopsies were then obtained with
the Senyorita Kalad automated biopsy device. Biopsy specimens were placed
in both formalin and saline and sent to pathology for further
analysis. The needle was removed. Post biopsy axial CT imaging
demonstrates mild expansion of the left psoas muscle consistent with
mild intramuscular hematoma. No evidence of large hemorrhage or
complication. The patient tolerated the procedure well.
IMPRESSION: Technically successful ultrasound-guided biopsy of left
retroperitoneal lymphadenopathy.

## 2018-08-21 ENCOUNTER — Inpatient Hospital Stay: Payer: Medicare Other | Attending: Obstetrics and Gynecology | Admitting: Obstetrics and Gynecology

## 2018-08-21 ENCOUNTER — Inpatient Hospital Stay: Payer: Medicare Other

## 2018-08-21 ENCOUNTER — Other Ambulatory Visit: Payer: Self-pay

## 2018-08-21 ENCOUNTER — Encounter: Payer: Self-pay | Admitting: Obstetrics and Gynecology

## 2018-08-21 VITALS — BP 99/66 | HR 77 | Temp 97.8°F | Resp 16

## 2018-08-21 DIAGNOSIS — Z9221 Personal history of antineoplastic chemotherapy: Secondary | ICD-10-CM | POA: Insufficient documentation

## 2018-08-21 DIAGNOSIS — Z90722 Acquired absence of ovaries, bilateral: Secondary | ICD-10-CM | POA: Diagnosis not present

## 2018-08-21 DIAGNOSIS — C569 Malignant neoplasm of unspecified ovary: Secondary | ICD-10-CM | POA: Diagnosis not present

## 2018-08-21 DIAGNOSIS — Z90710 Acquired absence of both cervix and uterus: Secondary | ICD-10-CM | POA: Insufficient documentation

## 2018-08-21 NOTE — Progress Notes (Signed)
Gynecologic Oncology Interval Visit   Referring Provider: Dr. 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 G0P0 female with locally recurrent advanced ovarian cancer, currently on olaparib, who presents to clinic for follow up and pelvic exam.   She is s/p 4 cycles of neoadjuvant chemotherapy followed by interval debulking to no gross residual disease followed by 3 cycles of adjuvant carbo-taxol completed 05/09/2017. In November 2019 she had local recurrence and received 3 cycles of carbo-taxol 03/15/2018 - 04/26/2018. She is HRD positive and is currently on olaparib.   Today, she reports overall feeling well. She continues to have some abdominal bloating and intermittent nausea but doesn't feel bothered by these enough to take anything for her symptoms. She has imaging scheduled for 08/28/2018.   CA 125 has been followed:  6.9 06/07/2018 7.0 06/21/2018 8.4 07/05/2018 18.7 08/02/2018    Oncology History: Katie Hill is a pleasant. G0P0 female with advanced ovarian cancer.  She presented to the ER 11/13/2016 with symptoms of right lower quadrant pain for 3-4 weeks, associated with nausea, bloating, no change in weight, urinary frequency, and change in bowel habits with more diffucult and stringy BM's.  CT CHEST, ABDOMEN, AND PELVIS WITH CONTRAST IMPRESSION: 1. Large heterogeneous central pelvic mass measures up to 12.6 cm. The lesion generates substantial mass-effect on the uterus, bladder, and pelvic sidewall anatomy. The mass appears to invade the mid sigmoid colon. Etiology of the central pelvic mass is not definitive by CT, but ovarian primary is suspected. The lesion becomes indistinguishable from the posterior uterus on some images and uterine origin is possible, but the uterus does not appear to be the epicenter of the mass and appears to be more displaced by it. MRI of the pelvis without and with contrast  may prove helpful to better delineate the relationship of the uterus to the central pelvic mass although it may not be able to definitively localize the origin. 2. Bulky retroperitoneal and left pelvic sidewall lymphadenopathy. The retroperitoneal lymphadenopathy generates substantial mass-effect on the IVC and left renal vein. The external iliac veins along each pelvic sidewall are markedly attenuated by the mass/lymphadenopathy. Patency of these vessels cannot be definitely confirmed on this exam.  11/16/16 LYMPH NODE, LEFT RETROPERITONEAL; CT-GUIDED CORE BIOPSY:  - METASTATIC HIGH-GRADE SEROUS CARCINOMA.   Decision was made to do neoadjuvant carbo/taxol chemotherapy.  CA125 fell from 4,382 to 64.6 (10/29).  CT scan showed dramatic response.  CT scan 10/14 Vascular/Lymphatic: Normal appearance of the abdominal aorta. Interval decrease in size of previously identified bulky retroperitoneal adenopathy. Index aortocaval node measures 1.6 x 2.6 cm, image 32 of series 2. Previously 4.4 x 5.3 cm. Index left periaortic nodal mass measures 2.2 x 1.3 cm, image 31 of series 2. Previously 3.7 x 4.7 cm. At the level of the bifurcation there is a low-attenuation nodal mass which measure 3.1 x 2.6 cm, image 44 of series 2. Previously 4.3 x 4.8 cm. Left common iliac node measures 1.2 cm, image 53 of series 2. Previously 2.2 cm. Left external iliac node measures 1.6 x 1.1 cm, image 67 of series 2. Previously 4.8 x 2.6 cm. Reproductive: Large mass centered around the uterus measures 9.4 x 5.5 cm, image 67 of series 2. Previously this measured 12.6 x 9.2 cm  On 03/14/2017 she underwent L/S converted to XL, TAH, BSO, right PA node resection, omentectomy, and LOA to no residual disease.  Pathology 03/14/2017  DIAGNOSIS:  A. OMENTUM; OMENTECTOMY:  - NO TUMOR SEEN.  - ONE NEGATIVE LYMPH NODE (0/1).   B. RIGHT FALLOPIAN TUBE AND OVARY; SALPINGO-OOPHORECTOMY:  - SMALL FOCI OF HIGH GRADE SEROUS CARCINOMA INVOLVING  THE OVARY.  - MARKED THERAPY RELATED CHANGE.  - NO TUMOR SEEN IN THE FALLOPIAN TUBE.   C. UTERUS, CERVIX, LEFT FALLOPIAN TUBE AND OVARY; HYSTERECTOMY AND LEFT  SALPINGO-OOPHORECTOMY:  - NABOTHIAN CYSTS.  - CYSTIC ATROPHY OF THE ENDOMETRIUM.  - SEROSAL ADHESIONS.  - UNREMARKABLE FALLOPIAN TUBE.  - OVARY SHOWING TREATMENT RELATED CHANGE.   D. 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   Then completed an additional 3 cycles of carbo/Taxol and CA 125 was 7.4 05/09/17.  CA 125  14 in 5/19  21 in 8/19.   CT scan 11/22/17 IMPRESSION: 1. Interval debulking surgery with decrease in retroperitoneal and extraperitoneal lymphadenopathy. In some areas the lymphadenopathy has resolved completely. Peritoneal implant seen along the falciform ligament on the prior study has decreased. 2. 4 x 2 cm collection of complex fluid or soft tissue in the right cul-de-sac is in the region of the complex mass lesion seen on the previous study. Given that there is some apparent enhancement peripherally, residual/recurrent disease is a concern and close attention will be required. 3. Stable tiny bilateral pulmonary nodules with no pleural effusion.  CA125  25.7 12/24/17 46.3 02/18/18  02/28/2018- CT C/A/P  1. Compared to the prior examination there has been interval enlargement of an aortocaval lymph node which currently measures 1.4 cm in short axis (axial image 75 of series 2). This lymph node is in close proximity to the previously resected retroperitoneal lymphadenopathy and is very concerning for an additional nodal metastasis. 2. Other findings in the chest, abdomen and pelvis appear very similar to prior examinations, as discussed above. 3. Aortic atherosclerosis.  She received 3 cycles of carbo-Taxol 03/15/2018-04/26/2018. CA 125 improved to 6.0. She started olaparib 300 mg BID for platinum-sensitive recurrent ovarian cancer.    CA125 04/05/2018    15.3 04/26/2018       6.3 05/17/2018         6.0    Genetic testing negative for 83 genes on [facility]'s Multi-Cancer panel (ALK, APC, ATM, AXIN2, BAP1, BARD1, BLM, BMPR1A, BRCA1, BRCA2, BRIP1, CASR, CDC73, CDH1, CDK4, CDKN1B, CDKN1C, CDKN2A, CEBPA, CHEK2, CTNNA1, DICER1, DIS3L2, EGFR, EPCAM, FH, FLCN, GATA2, GPC3, GREM1, HOXB13, HRAS, KIT, MAX, MEN1, MET, MITF, MLH1, MSH2, MSH3, MSH6, MUTYH, NBN, NF1, NF2, NTHL1, PALB2, PDGFRA, PHOX2B, PMS2, POLD1, POLE, POT1, PRKAR1A, PTCH1, PTEN, RAD50, RAD51C, RAD51D, RB1, RECQL4, RET, RUNX1, SDHA, SDHAF2, SDHB, SDHC, SDHD, SMAD4, SMARCA4, SMARCB1, SMARCE1, STK11, SUFU, TERC, TERT, TMEM127, TP53, TSC1, TSC2, VHL, WRN, WT1).  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 This is still considered a normal result.   Somatic tumor testing:  She has HRD positive cancer, negative for somatic or germline BRCA mutation.   Patient Active Problem List   Diagnosis Date Noted  . Goals of care, counseling/discussion 03/09/2018  . Genetic testing 03/28/2017  . S/P total abdominal hysterectomy and bilateral salpingo-oophorectomy 03/14/2017  . Hyponatremia 12/12/2016  . Dehydration 12/08/2016  . Malignant neoplasm of ovary ([place]) 11/20/2016  . Pelvic mass 11/15/2016   Past Medical History:  Diagnosis Date  . Dysrhythmia   . Genetic testing 03/28/2017   Multi-Cancer panel (83 genes) @ [facility] - No pathogenic mutations detected  . High grade ovarian cancer ([place]) 11/20/2016  .  Pelvic mass in female    Past Surgical History:  Procedure Laterality Date  . APPENDECTOMY    . LAPAROSCOPY N/A 03/14/2017   Procedure: LAPAROSCOPY OPERATIVE;  Surgeon: Mellody Drown, MD;  Location: [facility];  Service: Gynecology;  Laterality: N/A;  . LAPAROTOMY N/A 03/14/2017   Procedure: LAPAROTOMY;  Surgeon: Mellody Drown, MD;  Location: [facility];  Service: Gynecology;  Laterality: N/A;  . LYMPH NODE DISSECTION N/A 03/14/2017    Procedure: LYMPH NODE DISSECTION;  Surgeon: Mellody Drown, MD;  Location: [facility];  Service: Gynecology;  Laterality: N/A;  . OMENTECTOMY N/A 03/14/2017   Procedure: OMENTECTOMY;  Surgeon: Mellody Drown, MD;  Location: [facility];  Service: Gynecology;  Laterality: N/A;  . PORTA CATH INSERTION N/A 11/27/2016   Procedure: Glori Luis Cath Insertion;  Surgeon: Algernon Huxley, MD;  Location: [facility];  Service: Cardiovascular;  Laterality: N/A;   Past Gynecologic History:  Menarche: 17 Last Menstrual Period: 24 years ago History of Abnormal pap: no Last pap: years ago She does not have regular medical cancer and has not has screening mammogram, colonoscopy, or Pap smears. She has not had an abnormal Pap.   OB History    Gravida  0   Para  0   Term  0   Preterm  0   AB  0   Living  0     SAB  0   TAB  0   Ectopic  0   Multiple  0   Live Births  0          Family History  Problem Relation Age of Onset  . Throat cancer Cousin   . Throat cancer Cousin   . Leukemia Cousin    Social History   Socioeconomic History  . Marital status: Married    Spouse name: Not on file  . Number of children: Not on file  . Years of education: Not on file  . Highest education level: Not on file  Occupational History  . Not on file  Social Needs  . Financial resource strain: Not on file  . Food insecurity:    Worry: Not on file    Inability: Not on file  . Transportation needs:    Medical: Not on file    Non-medical: Not on file  Tobacco Use  . Smoking status: Never Smoker  . Smokeless tobacco: Never Used  Substance and Sexual Activity  . Alcohol use: No    Frequency: Never    Comment: use to be occ. but not any since 5 months  . Drug use: No  . Sexual activity: Yes    Birth control/protection: Post-menopausal  Lifestyle  . Physical activity:    Days per week: Not on file    Minutes per session: Not on file  . Stress: Not on file  Relationships  . Social  connections:    Talks on phone: Not on file    Gets together: Not on file    Attends religious service: Not on file    Active member of club or organization: Not on file    Attends meetings of clubs or organizations: Not on file    Relationship status: Not on file  Other Topics Concern  . Not on file  Social History Narrative  . Not on file   Allergies  Allergen Reactions  . Omeprazole Rash   Current Outpatient Medications on File Prior to Visit  Medication Sig Dispense Refill  . calcium carbonate (TUMS -  DOSED IN MG ELEMENTAL CALCIUM) 500 MG chewable tablet Chew 1 tablet by mouth as needed for indigestion or heartburn.    . dexamethasone (DECADRON) 4 MG tablet Take 2 tablets (8 mg total) by mouth daily. Take 2 days before and 2 days after chemotherapy (Patient not taking: Reported on 06/07/2018) 60 tablet 1  . lidocaine-prilocaine (EMLA) cream Apply 1 application topically as needed. Apply small amount to port site at least 1 hour prior to it being accessed, cover with plastic wrap 30 g 1  . loperamide (IMODIUM) 2 MG capsule Take 1 capsule (2 mg total) by mouth See admin instructions. With onset of loose stool, take 41m followed by 222mevery 2 hours until 12 hours have passed without loose bowel movement. Maximum: 16 mg/day 120 capsule 1  . olaparib (LYNPARZA) 150 MG tablet Take 2 tablets (300 mg total) by mouth 2 (two) times daily. 120 tablet 2  . ondansetron (ZOFRAN) 8 MG tablet Take 1 tablet (8 mg total) by mouth 2 (two) times daily as needed for refractory nausea / vomiting. Start on day 3 after chemo. 30 tablet 1  . prochlorperazine (COMPAZINE) 10 MG tablet Take 1 tablet (10 mg total) by mouth every 6 (six) hours as needed (Nausea or vomiting). 30 tablet 1  . Tetrahydrozoline HCl (REDNESS RELIEVER EYE DROPS OP) Place 1 drop into both eyes as needed (for red eyes).     . vitamin B-12 (CYANOCOBALAMIN) 1000 MCG tablet Take 1 tablet (1,000 mcg total) by mouth daily. 90 tablet 1   No  current facility-administered medications on file prior to visit.    Review of Systems General:  no complaints Skin: no complaints Eyes: no complaints HEENT: no complaints Breasts: no complaints Pulmonary: no complaints Cardiac: no complaints Gastrointestinal: nausea & bloating  Genitourinary/Sexual: no complaints Ob/Gyn: no complaints Musculoskeletal: no complaints Hematology: no complaints Neurologic/Psych: no complaints   Objective:  Physical Examination:  Today's Vitals   08/21/18 1244 08/21/18 1245  BP: 99/66   Pulse: 77   Resp: 16   Temp: 97.8 F (36.6 C)   TempSrc: Tympanic   PainSc: 0-No pain 0-No pain   There is no height or weight on file to calculate BMI.  ECOG Performance Status: 1 - Symptomatic but completely ambulatory  GENERAL: thin built, female. No acute distress.  HEENT:  Sclera clear. Anicteric. Alopecia.  ABDOMEN:  Soft, nontender.  No hernias, incisions well healed. No masses or ascites EXTREMITIES:  No peripheral edema. Atraumatic. No cyanosis  NEURO:  Nonfocal. Well oriented.  Appropriate affect.  Pelvic: exam chaperoned by nurse;  Vulva: normal appearing vulva with no masses, tenderness or lesions; Vagina: normal vagina, cuff healed. Adnexa/Uterus/Cervix: surgically absent; BME negative for masses or nodularity. Rectal: not performed.  Assessment:  Katie Hill a [age].o. female diagnosed with high grade serous ovarian cancer (HRD) on CT directed node biopsy with partial bowel obstruction and extensive bulky adenopathy 8/18. s/p 4 cycles of carbo/taxol chemotherapy with dramatic decline in CA125 and improvement on CT scan. Interval debulking surgery to no gross residual on 03/14/2017.  Reinitiation of chemotherapy on 03/28/2017 with normalization of CA125.  Platinum-sensitive recurrence with normalization with re-induction of carbo-Taxol 03/15/2018-04/26/2018. Currently olaparib. Clinically NED on exam.   She does not have a germline  mutation in BRCA1/2 or other related gene, but somatic tumor testing is positive for HRD.     Medical co-morbidities complicating care: prior abdominal surgery.  Plan:   Problem List Items Addressed This Visit  Genitourinary   Malignant neoplasm of ovary ([place]) - Primary   Continue to follow closely. She will see Dr. Tasia Catchings for monthly evaluation on olaparib. She is currently doing well other than mild GI complaints that she will review with Dr. Collie Siad team. Dr. Tasia Catchings has also scheduled a  CT scan.    A total of 20 minutes were spent with the patient/family today; >50% was spent in education, counseling and coordination of care for ovarian cancer.  Beckey Rutter, DNP, AGNP-C [place] [facility] [phone] (work cell) [phone] (office)  I personally interviewed and examined the patient. Agreed with the above/below plan of care. Patient/family questions were answered.  Santiago Glad, MD   CC:  Gae Dry,  MD [address] [place], [place] [address]  Dr. Tasia Catchings

## 2018-08-28 ENCOUNTER — Ambulatory Visit
Admission: RE | Admit: 2018-08-28 | Discharge: 2018-08-28 | Disposition: A | Discharge status: Home / Self Care | Payer: Medicare Other | Source: Ambulatory Visit | Attending: Oncology | Admitting: Oncology

## 2018-08-28 ENCOUNTER — Other Ambulatory Visit: Payer: Self-pay

## 2018-08-28 DIAGNOSIS — R911 Solitary pulmonary nodule: Secondary | ICD-10-CM | POA: Diagnosis not present

## 2018-08-28 DIAGNOSIS — Z90710 Acquired absence of both cervix and uterus: Secondary | ICD-10-CM | POA: Insufficient documentation

## 2018-08-28 DIAGNOSIS — C569 Malignant neoplasm of unspecified ovary: Secondary | ICD-10-CM | POA: Insufficient documentation

## 2018-08-28 DIAGNOSIS — C562 Malignant neoplasm of left ovary: Secondary | ICD-10-CM | POA: Diagnosis not present

## 2018-08-28 DIAGNOSIS — N289 Disorder of kidney and ureter, unspecified: Secondary | ICD-10-CM | POA: Diagnosis not present

## 2018-08-28 MED ORDER — IOHEXOL 300 MG/ML  SOLN
100.0000 mL | Freq: Once | INTRAMUSCULAR | Status: AC | PRN
  Administered 2018-08-28: 10:00:00 80 mL via INTRAVENOUS

## 2018-08-29 ENCOUNTER — Other Ambulatory Visit: Payer: Self-pay

## 2018-08-29 ENCOUNTER — Other Ambulatory Visit: Payer: Medicare Other

## 2018-08-29 ENCOUNTER — Inpatient Hospital Stay (HOSPITAL_BASED_OUTPATIENT_CLINIC_OR_DEPARTMENT_OTHER): Payer: Medicare Other | Admitting: Oncology

## 2018-08-29 ENCOUNTER — Ambulatory Visit: Payer: Medicare Other | Admitting: Oncology

## 2018-08-29 ENCOUNTER — Encounter: Payer: Self-pay | Admitting: Oncology

## 2018-08-29 ENCOUNTER — Inpatient Hospital Stay: Payer: Medicare Other

## 2018-08-29 VITALS — BP 87/52 | HR 77 | Temp 97.7°F | Wt 120.3 lb

## 2018-08-29 DIAGNOSIS — C569 Malignant neoplasm of unspecified ovary: Secondary | ICD-10-CM

## 2018-08-29 DIAGNOSIS — E871 Hypo-osmolality and hyponatremia: Secondary | ICD-10-CM | POA: Diagnosis not present

## 2018-08-29 DIAGNOSIS — D539 Nutritional anemia, unspecified: Secondary | ICD-10-CM

## 2018-08-29 DIAGNOSIS — D701 Agranulocytosis secondary to cancer chemotherapy: Secondary | ICD-10-CM

## 2018-08-29 DIAGNOSIS — Z5111 Encounter for antineoplastic chemotherapy: Secondary | ICD-10-CM

## 2018-08-29 DIAGNOSIS — Z90710 Acquired absence of both cervix and uterus: Secondary | ICD-10-CM | POA: Diagnosis not present

## 2018-08-29 DIAGNOSIS — D702 Other drug-induced agranulocytosis: Secondary | ICD-10-CM

## 2018-08-29 DIAGNOSIS — C561 Malignant neoplasm of right ovary: Secondary | ICD-10-CM

## 2018-08-29 DIAGNOSIS — Z9221 Personal history of antineoplastic chemotherapy: Secondary | ICD-10-CM | POA: Diagnosis not present

## 2018-08-29 DIAGNOSIS — Z90722 Acquired absence of ovaries, bilateral: Secondary | ICD-10-CM | POA: Diagnosis not present

## 2018-08-29 DIAGNOSIS — Z95828 Presence of other vascular implants and grafts: Secondary | ICD-10-CM

## 2018-08-29 LAB — VITAMIN B12: Vitamin B-12: 366 pg/mL (ref 180–914)

## 2018-08-29 LAB — COMPREHENSIVE METABOLIC PANEL
ALT: 10 U/L (ref 0–44)
AST: 16 U/L (ref 15–41)
Albumin: 4.8 g/dL (ref 3.5–5.0)
Alkaline Phosphatase: 63 U/L (ref 38–126)
Anion gap: 6 (ref 5–15)
BUN: 10 mg/dL (ref 8–23)
CO2: 25 mmol/L (ref 22–32)
Calcium: 9.6 mg/dL (ref 8.9–10.3)
Chloride: 101 mmol/L (ref 98–111)
Creatinine, Ser: 0.74 mg/dL (ref 0.44–1.00)
GFR calc Af Amer: >60 mL/min (ref >60)
GFR calc non Af Amer: >60 mL/min (ref >60)
Glucose, Bld: 109 mg/dL — ABNORMAL HIGH (ref 70–99)
Potassium: 3.7 mmol/L (ref 3.5–5.1)
Sodium: 132 mmol/L — ABNORMAL LOW (ref 135–145)
Total Bilirubin: 0.8 mg/dL (ref 0.3–1.2)
Total Protein: 7.2 g/dL (ref 6.5–8.1)

## 2018-08-29 LAB — CBC WITH DIFFERENTIAL/PLATELET
Abs Immature Granulocytes: 0.01 10*3/uL (ref 0.00–0.07)
Basophils Absolute: 0 10*3/uL (ref 0.0–0.1)
Basophils Relative: 1 %
Eosinophils Absolute: 0.1 10*3/uL (ref 0.0–0.5)
Eosinophils Relative: 4 %
HCT: 28.8 % — ABNORMAL LOW (ref 36.0–46.0)
Hemoglobin: 10.2 g/dL — ABNORMAL LOW (ref 12.0–15.0)
Immature Granulocytes: 0 %
Lymphocytes Relative: 31 %
Lymphs Abs: 0.9 10*3/uL (ref 0.7–4.0)
MCH: 41 pg — ABNORMAL HIGH (ref 26.0–34.0)
MCHC: 35.4 g/dL (ref 30.0–36.0)
MCV: 115.7 fL — ABNORMAL HIGH (ref 80.0–100.0)
Monocytes Absolute: 0.3 10*3/uL (ref 0.1–1.0)
Monocytes Relative: 10 %
Neutro Abs: 1.5 10*3/uL — ABNORMAL LOW (ref 1.7–7.7)
Neutrophils Relative %: 54 %
Platelets: 138 10*3/uL — ABNORMAL LOW (ref 150–400)
RBC: 2.49 MIL/uL — ABNORMAL LOW (ref 3.87–5.11)
RDW: 16.7 % — ABNORMAL HIGH (ref 11.5–15.5)
WBC: 2.8 10*3/uL — ABNORMAL LOW (ref 4.0–10.5)
nRBC: 0 % (ref 0.0–0.2)

## 2018-08-29 NOTE — Progress Notes (Signed)
Patient here today for follow up.  Patient states no new concerns today  

## 2018-08-30 LAB — CA 125: Cancer Antigen (CA) 125: 61.2 U/mL — ABNORMAL HIGH (ref 0.0–38.1)

## 2018-09-01 NOTE — Progress Notes (Signed)
[place] [facility] Follow up visit  Patient Care Team: Patient, [name], [name], [name], [name], MD as Referring Physician (Obstetrics and Gynecology)  CHIEF COMPLAINTS/PURPOSE OF Visit Follow up for chemotherapy tolerability of  ovarian cancer.  HISTORY OF PRESENTING ILLNESS: Katie Hill 68 y.o. female presents for follow up of management of stage IIIC ovarian cancer. She underwent  Neoadjuvant chemotherapy of carbo and taxol x 4  # Patient had debulking surgery on 03/14/2017. She had a laparoscopy with conversion to laparotomy, total hysterectomy, with bilateral salpingo oophorectomy, right aortic lymph node dissection, omentectomy. Pathology showed small foci of residual disease in ovary.   Genetic testing negative for83 genes on [facility]'s Multi-Cancer panel (ALK, APC, ATM, AXIN2, BAP1, BARD1, BLM, BMPR1A, BRCA1, BRCA2, BRIP1, CASR, CDC73, CDH1, CDK4, CDKN1B, CDKN1C, CDKN2A, CEBPA, CHEK2, CTNNA1, DICER1, DIS3L2, EGFR, EPCAM, FH, FLCN, GATA2, GPC3, GREM1, HOXB13, HRAS, KIT, MAX, MEN1, MET, MITF, MLH1, MSH2, MSH3, MSH6, MUTYH, NBN, NF1, NF2, NTHL1, PALB2, PDGFRA, PHOX2B, PMS2, POLD1, POLE, POT1, PRKAR1A, PTCH1, PTEN, RAD50, RAD51C, RAD51D, RB1, RECQL4, RET, RUNX1, SDHA, SDHAF2, SDHB, SDHC, SDHD, SMAD4, SMARCA4, SMARCB1, SMARCE1, STK11, SUFU, TERC, TERT, TMEM127, TP53, TSC1, TSC2, VHL, WRN, WT1).  A Variant of UncertainSignificancewas detected: CASRc.106G>A (p.Gly36Arg). Myraid testing negative for somatic BRACA1/2, positive for HRD.   # HRD positive, was referred to [facility] for clinical trials of Olarparib maintenance. She opted out.  #  Treatment:  Stage IIIC Ovarian cancer:  #s/p Carboplatin and taxol x 4 neoadjuvant chemotherapy followed by debulking surgery  03/14/2017 # 03/28/2017 S/p Adjuvant carbo and taxol x3   Local recurrence, CA125 one 46.3 03/15/2018-05/17/2018 Carboplatin and Taxol x 4. CA125 decrease from 49.7 to 6 after 4 cycles of treatment.  Started on Olarparib 360m BID on 05/17/2018.   INTERVAL HISTORY 68y.o. female with above oncology history reviewed by me today presents for evaluation for chemotherapy for treatment of Ovarian cancer  Patient reports feeling well at baseline.  Denies any nausea, vomiting, diarrhea. Denies any pain.  She takes olarparib, toelrates well.  No new complaints.    Review of Systems  Constitutional: Positive for fatigue. Negative for appetite change, chills and fever.  HENT:   Negative for hearing loss and voice change.   Eyes: Negative for eye problems.  Respiratory: Negative for chest tightness and cough.   Cardiovascular: Negative for chest pain.  Gastrointestinal: Negative for abdominal distention, abdominal pain and blood in stool.  Endocrine: Negative for hot flashes.  Genitourinary: Negative for bladder incontinence, difficulty urinating, frequency and hematuria.   Musculoskeletal: Negative for arthralgias.  Skin: Negative for itching and rash.  Neurological: Negative for extremity weakness.  Hematological: Negative for adenopathy.  Psychiatric/Behavioral: Negative for confusion.   MEDICAL HISTORY: Past Medical History:  Diagnosis Date  . Dysrhythmia   . Genetic testing 03/28/2017   Multi-Cancer panel (83 genes) @ [facility] - No pathogenic mutations detected  . [place]  . Pelvic mass in female     SURGICAL HISTORY: Past Surgical History:  Procedure Laterality Date  . APPENDECTOMY    . LAPAROSCOPY N/A 03/14/2017   Procedure: LAPAROSCOPY OPERATIVE;  Surgeon: BMellody Drown MD;  Location: [facility];  Service: Gynecology;  Laterality: N/A;  . LAPAROTOMY N/A 03/14/2017   Procedure: LAPAROTOMY;  Surgeon: BMellody Drown MD;  Location: [facility];  Service: Gynecology;  Laterality: N/A;  . LYMPH NODE DISSECTION N/A 03/14/2017   Procedure: LYMPH NODE DISSECTION;  Surgeon: Mellody Drown, MD;  Location: [facility];  Service: Gynecology;  Laterality: N/A;  . OMENTECTOMY N/A 03/14/2017   Procedure: OMENTECTOMY;  Surgeon: Mellody Drown, MD;  Location: [facility];  Service: Gynecology;  Laterality: N/A;  . PORTA CATH INSERTION N/A 11/27/2016   Procedure: Glori Luis Cath Insertion;  Surgeon: Algernon Huxley, MD;  Location: [facility];  Service: Cardiovascular;  Laterality: N/A;    SOCIAL HISTORY: Social History   Socioeconomic History  . Marital status: Married    Spouse name: Not on file  . Number of children: Not on file  . Years of education: Not on file  . Highest education level: Not on file  Occupational History  . Not on file  Social Needs  . Financial resource strain: Not on file  . Food insecurity:    Worry: Not on file    Inability: Not on file  . Transportation needs:    Medical: Not on file    Non-medical: Not on file  Tobacco Use  . Smoking status: Never Smoker  . Smokeless tobacco: Never Used  Substance and Sexual Activity  . Alcohol use: No    Frequency: Never    Comment: use to be occ. but not any since 5 months  . Drug use: No  . Sexual activity: Yes    Birth control/protection: Post-menopausal  Lifestyle  . Physical activity:    Days per week: Not on file    Minutes per session: Not on file  . Stress: Not on file  Relationships  . Social connections:    Talks on phone: Not on file    Gets together: Not on file    Attends religious service: Not on file    Active member of club or organization: Not on file    Attends meetings of clubs or organizations: Not on file    Relationship status: Not on file  . Intimate partner violence:    Fear of current or ex partner: Not on file    Emotionally abused: Not on file    Physically abused: Not on file    Forced sexual activity: Not on file  Other Topics  Concern  . Not on file  Social History Narrative  . Not on file    FAMILY HISTORY Family History  Problem Relation Age of Onset  . Throat cancer Cousin   . Throat cancer Cousin   . Leukemia Cousin     ALLERGIES:  is allergic to omeprazole.  MEDICATIONS:  Current Outpatient Medications  Medication Sig Dispense Refill  . calcium carbonate (TUMS - DOSED IN MG ELEMENTAL CALCIUM) 500 MG chewable tablet Chew 1 tablet by mouth as needed for indigestion or heartburn.    . lidocaine-prilocaine (EMLA) cream Apply 1 application topically as needed. Apply small amount to port site at least 1 hour prior to it being accessed, cover with plastic wrap 30 g 1  . loperamide (IMODIUM) 2 MG capsule Take 1 capsule (2 mg total) by mouth See admin instructions. With onset of loose stool, take 67m followed by 253mevery 2 hours until 12 hours have passed without loose bowel movement. Maximum: 16 mg/day 120 capsule 1  . olaparib (LYNPARZA) 150 MG tablet Take 2 tablets (300 mg total) by mouth 2 (two) times daily. 120 tablet 2  . ondansetron (ZOFRAN) 8 MG tablet Take 1 tablet (8 mg total) by mouth  2 (two) times daily as needed for refractory nausea / vomiting. Start on day 3 after chemo. 30 tablet 1  . prochlorperazine (COMPAZINE) 10 MG tablet Take 1 tablet (10 mg total) by mouth every 6 (six) hours as needed (Nausea or vomiting). 30 tablet 1  . Tetrahydrozoline HCl (REDNESS RELIEVER EYE DROPS OP) Place 1 drop into both eyes as needed (for red eyes).     . vitamin B-12 (CYANOCOBALAMIN) 1000 MCG tablet Take 1 tablet (1,000 mcg total) by mouth daily. 90 tablet 1   No current facility-administered medications for this visit.     PHYSICAL EXAMINATION:  ECOG PERFORMANCE STATUS: 0 - Asymptomatic Vitals:   08/29/18 1202  BP: (!) 87/52  Pulse: 77  Temp: 97.7 F (36.5 C)    Filed Weights   08/29/18 1202  Weight: 120 lb 5 oz (54.6 kg)     Physical Exam  Constitutional: She is oriented to person, place,  and time. No distress.  HENT:  Head: Normocephalic and atraumatic.  Nose: Nose normal.  Mouth/Throat: Oropharynx is clear and moist. No oropharyngeal exudate.  Eyes: Pupils are equal, round, and reactive to light. EOM are normal. No scleral icterus.  Neck: Normal range of motion. Neck supple. No JVD present.  Cardiovascular: Normal rate and regular rhythm.  No murmur heard. Pulmonary/Chest: Effort normal and breath sounds normal. No respiratory distress. She has no rales. She exhibits no tenderness.  Abdominal: Soft. Bowel sounds are normal. She exhibits no distension. There is no abdominal tenderness.  Musculoskeletal: Normal range of motion.        General: No edema.  Lymphadenopathy:    She has no cervical adenopathy.  Neurological: She is alert and oriented to person, place, and time. No cranial nerve deficit. She exhibits normal muscle tone. Coordination normal.  Skin: Skin is warm and dry. She is not diaphoretic. No erythema.  Right anterior medi port + 0.5cm raised erythematous skin lesion on anterior chest wall.   Psychiatric: Affect and judgment normal.       SKIN: LABORATORY DATA: I have personally reviewed the data as listed: CBC    Component Value Date/Time   WBC 2.8 (L) 08/29/2018 1127   RBC 2.49 (L) 08/29/2018 1127   HGB 10.2 (L) 08/29/2018 1127   HCT 28.8 (L) 08/29/2018 1127   PLT 138 (L) 08/29/2018 1127   MCV 115.7 (H) 08/29/2018 1127   MCH 41.0 (H) 08/29/2018 1127   MCHC 35.4 08/29/2018 1127   RDW 16.7 (H) 08/29/2018 1127   LYMPHSABS 0.9 08/29/2018 1127   MONOABS 0.3 08/29/2018 1127   EOSABS 0.1 08/29/2018 1127   BASOSABS 0.0 08/29/2018 1127   CMP Latest Ref Rng & Units 08/29/2018 08/16/2018 08/02/2018  Glucose 70 - 99 mg/dL 109(H) 109(H) 94  BUN 8 - 23 mg/dL _0 Creatinine 0.44 - 1.00 mg/dL 0.74 0.74 0.64  Sodium 135 - 145 mmol/L 132(L) 134(L) 132(L)  Potassium 3.5 - 5.1 mmol/L 3.7 4.3 3.7  Chloride 98 - 111 mmol/L 101 105 104  CO2 22 - 32 mmol/L  _1 Calcium 8.9 - 10.3 mg/dL 9.6 9.7 9.4  Total Protein 6.5 - 8.1 g/dL 7.2 7.1 7.1  Total Bilirubin 0.3 - 1.2 mg/dL 0.8 0.8 0.8  Alkaline Phos 38 - 126 U/L 63 58 64  AST 15 - 41 U/L _2 ALT 0 - 44 U/L _3 pathology 11/16/2016 Surgical Pathology  CASE: [id number]  PATIENT: Katie Hill  Surgical Pathology Report   SPECIMEN SUBMITTED:  A. Retroperitoneal adenopathy, left  DIAGNOSIS:  A. LYMPH NODE, LEFT RETROPERITONEAL; CT-GUIDED CORE BIOPSY:  - METASTATIC HIGH-GRADE SEROUS CARCINOMA.   RADIOGRAPHIC STUDIES: I have personally reviewed the radiological images as listed and agree with the findings in the report CT chest abdomen pelvis with contrast 11/13/2016 IMPRESSION: 1. Large heterogeneous central pelvic mass measures up to 12.6 cm. The lesion generates substantial mass-effect on the uterus, bladder, and pelvic sidewall anatomy. The mass appears to invade the mid sigmoid colon. Etiology of the central pelvic mass is not definitive by CT, but ovarian primary is suspected. The lesion becomes indistinguishable from the posterior uterus on some images and uterine origin is possible, but the uterus does not appear to be the epicenter of the mass and appears to be more displaced by it. MRI of the pelvis without and with contrast may prove helpful to better delineate the relationship of the uterus to the central pelvic mass although it may not be able to definitively localize the origin. 2. Bulky retroperitoneal and left pelvic sidewall lymphadenopathy. The retroperitoneal lymphadenopathy generates substantial mass-effect on the IVC and left renal vein. The external iliac veins along each pelvic sidewall are markedly attenuated by the mass/lymphadenopathy. Patency of these vessels cannot be definitely confirmed on this exam.  Pathology 03/14/2017   DIAGNOSIS:  A. OMENTUM; OMENTECTOMY:  - NO TUMOR SEEN.  - ONE NEGATIVE LYMPH NODE (0/1).   B. RIGHT FALLOPIAN TUBE  AND OVARY; SALPINGO-OOPHORECTOMY:  - SMALL FOCI OF HIGH GRADE SEROUS CARCINOMA INVOLVING THE OVARY.  - MARKED THERAPY RELATED CHANGE.  - NO TUMOR SEEN IN THE FALLOPIAN TUBE.   C. UTERUS, CERVIX, LEFT FALLOPIAN TUBE AND OVARY; HYSTERECTOMY AND LEFT  SALPINGO-OOPHORECTOMY:  - NABOTHIAN CYSTS.  - CYSTIC ATROPHY OF THE ENDOMETRIUM.  - SEROSAL ADHESIONS.  - UNREMARKABLE FALLOPIAN TUBE.  - OVARY SHOWING TREATMENT RELATED CHANGE.   D. 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   Results for RENIA, MIKELSON" (MRN [id number]) as of 03/04/2018 16:52  Ref. Range 11/16/2016 08:47 12/25/2016 09:26 01/15/2017 09:05 02/05/2017 10:07 02/28/2017 13:35 03/28/2017 08:27 05/09/2017 08:35 07/04/2017 14:49 08/27/2017 09:45 11/09/2017 11:14 12/24/2017 11:25 02/18/2018 14:48  Cancer Antigen (CA) 125 Latest Ref Range: 0.0 - 38.1 U/mL 4,382.0 (H) 699.1 (H) 207.6 (H) 64.6 (H) 36.9 31.9 7.4 11.3 14.1 21.0 25.7 46.3 (H)   RADIOGRAPHIC STUDIES: I have personally reviewed the radiological images as listed and agreed with the findings in the report.  02/28/2018 CT chest abdomen pelvis showed interval enlargement off and aorto caval lymph node which currently measures 1.4 cm in short axis.  This lymph node is in close proximity to the previously resected retroperitoneal lymphadenopathy and it was a concern for additional nodal metastasis.Small lung nodules stable.  08/28/2018 CT chest abdomen pevlis.  . Marked interval decrease in the abdominal aortocaval lymph node of concern on the prior study, measuring 5 mm short axis today compared to 14 mm on the prior study. 2. No new or progressive findings on today's exam to suggest new recurrent or metastatic disease. 3. Stable 6 mm left lower lobe pulmonary nodule.  ASSESSMENT/PLAN Cancer Staging Malignant neoplasm of ovary [facility]) Staging form: Ovary, Fallopian Tube, and Primary Peritoneal Carcinoma,  AJCC 8th Edition - Clinical stage from 11/22/2016: Stage IIIC (cT3c, cN1b, cM0) - Signed by Earlie Server, MD on 11/22/2016  1. Malignant neoplasm of ovary, unspecified laterality ([place])   2. Encounter for  antineoplastic chemotherapy   3. Port-A-Cath in place   4. Drug-induced neutropenia ([place])   : # Ovarian Cancer, local recurrence.  Tolerates olaparib 300 mg twice daily well Continue current regimen. Labs are reviewed and discussed with patient. I have personally reviewed the CT images and discussed with patient.    #Drug-induced neutropenia, ANC 1.5, stable. continue to monitor.   #Macrocytic anemia.  Due to olarparib and vitamin b12 deficiency.  Vitamin B2 deficiency continue  vitamin B12 1089mg daily. B12 level has improved.   # Medi port will need to be flushed every 6 weeks.  Patient is aware about that. #Hyponatremia, chronic. Sodium is stable. Continue to monitor .  RTF in 4 weeks with labs MD assessment.  No orders of the defined types were placed in this encounter.    ZEarlie Server MD, PhD Hematology Oncology [facility]at AAbrazo Arrowhead CampusPager- [phone]/24/20

## 2018-09-23 ENCOUNTER — Other Ambulatory Visit: Payer: Self-pay

## 2018-09-23 ENCOUNTER — Inpatient Hospital Stay: Payer: Medicare Other

## 2018-09-23 ENCOUNTER — Inpatient Hospital Stay: Payer: Medicare Other | Attending: Oncology

## 2018-09-23 ENCOUNTER — Inpatient Hospital Stay (HOSPITAL_BASED_OUTPATIENT_CLINIC_OR_DEPARTMENT_OTHER): Payer: Medicare Other | Admitting: Oncology

## 2018-09-23 ENCOUNTER — Encounter: Payer: Self-pay | Admitting: Oncology

## 2018-09-23 VITALS — BP 111/69 | HR 79 | Temp 97.3°F | Wt 120.9 lb

## 2018-09-23 DIAGNOSIS — Z5111 Encounter for antineoplastic chemotherapy: Secondary | ICD-10-CM

## 2018-09-23 DIAGNOSIS — C569 Malignant neoplasm of unspecified ovary: Secondary | ICD-10-CM

## 2018-09-23 DIAGNOSIS — E538 Deficiency of other specified B group vitamins: Secondary | ICD-10-CM | POA: Diagnosis not present

## 2018-09-23 DIAGNOSIS — D539 Nutritional anemia, unspecified: Secondary | ICD-10-CM | POA: Diagnosis not present

## 2018-09-23 DIAGNOSIS — D702 Other drug-induced agranulocytosis: Secondary | ICD-10-CM

## 2018-09-23 DIAGNOSIS — E871 Hypo-osmolality and hyponatremia: Secondary | ICD-10-CM | POA: Insufficient documentation

## 2018-09-23 DIAGNOSIS — Z95828 Presence of other vascular implants and grafts: Secondary | ICD-10-CM

## 2018-09-23 DIAGNOSIS — D701 Agranulocytosis secondary to cancer chemotherapy: Secondary | ICD-10-CM

## 2018-09-23 LAB — CBC WITH DIFFERENTIAL/PLATELET
Abs Immature Granulocytes: 0.01 10*3/uL (ref 0.00–0.07)
Basophils Absolute: 0 10*3/uL (ref 0.0–0.1)
Basophils Relative: 1 %
Eosinophils Absolute: 0.1 10*3/uL (ref 0.0–0.5)
Eosinophils Relative: 3 %
HCT: 28 % — ABNORMAL LOW (ref 36.0–46.0)
Hemoglobin: 10 g/dL — ABNORMAL LOW (ref 12.0–15.0)
Immature Granulocytes: 0 %
Lymphocytes Relative: 31 %
Lymphs Abs: 0.9 10*3/uL (ref 0.7–4.0)
MCH: 42.2 pg — ABNORMAL HIGH (ref 26.0–34.0)
MCHC: 35.7 g/dL (ref 30.0–36.0)
MCV: 118.1 fL — ABNORMAL HIGH (ref 80.0–100.0)
Monocytes Absolute: 0.2 10*3/uL (ref 0.1–1.0)
Monocytes Relative: 8 %
Neutro Abs: 1.6 10*3/uL — ABNORMAL LOW (ref 1.7–7.7)
Neutrophils Relative %: 57 %
Platelets: 143 10*3/uL — ABNORMAL LOW (ref 150–400)
RBC: 2.37 MIL/uL — ABNORMAL LOW (ref 3.87–5.11)
RDW: 14.6 % (ref 11.5–15.5)
WBC: 2.8 10*3/uL — ABNORMAL LOW (ref 4.0–10.5)
nRBC: 0 % (ref 0.0–0.2)

## 2018-09-23 LAB — COMPREHENSIVE METABOLIC PANEL
ALT: 9 U/L (ref 0–44)
AST: 15 U/L (ref 15–41)
Albumin: 4.7 g/dL (ref 3.5–5.0)
Alkaline Phosphatase: 62 U/L (ref 38–126)
Anion gap: 9 (ref 5–15)
BUN: 12 mg/dL (ref 8–23)
CO2: 24 mmol/L (ref 22–32)
Calcium: 9.9 mg/dL (ref 8.9–10.3)
Chloride: 102 mmol/L (ref 98–111)
Creatinine, Ser: 0.76 mg/dL (ref 0.44–1.00)
GFR calc Af Amer: >60 mL/min (ref >60)
GFR calc non Af Amer: >60 mL/min (ref >60)
Glucose, Bld: 128 mg/dL — ABNORMAL HIGH (ref 70–99)
Potassium: 4 mmol/L (ref 3.5–5.1)
Sodium: 135 mmol/L (ref 135–145)
Total Bilirubin: 0.7 mg/dL (ref 0.3–1.2)
Total Protein: 7.5 g/dL (ref 6.5–8.1)

## 2018-09-23 MED ORDER — HEPARIN SOD (PORK) LOCK FLUSH 100 UNIT/ML IV SOLN
INTRAVENOUS | Status: AC
  Filled 2018-09-23: qty 5

## 2018-09-23 MED ORDER — SODIUM CHLORIDE 0.9% FLUSH
10.0000 mL | Freq: Once | INTRAVENOUS | Status: AC
  Administered 2018-09-23: 10 mL via INTRAVENOUS
  Filled 2018-09-23: qty 10

## 2018-09-23 MED ORDER — HEPARIN SOD (PORK) LOCK FLUSH 100 UNIT/ML IV SOLN
500.0000 [IU] | Freq: Once | INTRAVENOUS | Status: AC
  Administered 2018-09-23: 500 [IU] via INTRAVENOUS

## 2018-09-24 LAB — CA 125: Cancer Antigen (CA) 125: 111 U/mL — ABNORMAL HIGH (ref 0.0–38.1)

## 2018-09-24 NOTE — Progress Notes (Signed)
[place] [facility] Follow up visit  Patient Care Team: Patient, [name], [name], [name], [name], MD as Referring Physician (Obstetrics and Gynecology)  CHIEF COMPLAINTS/PURPOSE OF Visit Follow up for chemotherapy tolerability of  ovarian cancer.  HISTORY OF PRESENTING ILLNESS: Katie Hill 68 y.o. female presents for follow up of management of stage IIIC ovarian cancer. She underwent  Neoadjuvant chemotherapy of carbo and taxol x 4  # Patient had debulking surgery on 03/14/2017. She had a laparoscopy with conversion to laparotomy, total hysterectomy, with bilateral salpingo oophorectomy, right aortic lymph node dissection, omentectomy. Pathology showed small foci of residual disease in ovary.   Genetic testing negative for83 genes on [facility]'s Multi-Cancer panel (ALK, APC, ATM, AXIN2, BAP1, BARD1, BLM, BMPR1A, BRCA1, BRCA2, BRIP1, CASR, CDC73, CDH1, CDK4, CDKN1B, CDKN1C, CDKN2A, CEBPA, CHEK2, CTNNA1, DICER1, DIS3L2, EGFR, EPCAM, FH, FLCN, GATA2, GPC3, GREM1, HOXB13, HRAS, KIT, MAX, MEN1, MET, MITF, MLH1, MSH2, MSH3, MSH6, MUTYH, NBN, NF1, NF2, NTHL1, PALB2, PDGFRA, PHOX2B, PMS2, POLD1, POLE, POT1, PRKAR1A, PTCH1, PTEN, RAD50, RAD51C, RAD51D, RB1, RECQL4, RET, RUNX1, SDHA, SDHAF2, SDHB, SDHC, SDHD, SMAD4, SMARCA4, SMARCB1, SMARCE1, STK11, SUFU, TERC, TERT, TMEM127, TP53, TSC1, TSC2, VHL, WRN, WT1).  A Variant of UncertainSignificancewas detected: CASRc.106G>A (p.Gly36Arg). Myraid testing negative for somatic BRACA1/2, positive for HRD.   # HRD positive, was referred to [facility] for clinical trials of Olarparib maintenance. She opted out.  #  Treatment:  Stage IIIC Ovarian cancer:  #s/p Carboplatin and taxol x 4 neoadjuvant chemotherapy followed by debulking surgery  03/14/2017 # 03/28/2017 S/p Adjuvant carbo and taxol x3   Local recurrence, CA125 one 46.3 03/15/2018-05/17/2018 Carboplatin and Taxol x 4. CA125 decrease from 49.7 to 6 after 4 cycles of treatment.  Started on Olarparib 338m BID on 05/17/2018.   INTERVAL HISTORY 68y.o. female with above oncology history reviewed by me today presents for evaluation for chemotherapy for treatment of Ovarian cancer  Patient reports feeling well at baseline. Denies any nausea vomiting diarrhea.  Denies any pain. She is taking olaparib.  Tolerating well. No new complaints today.   Review of Systems  Constitutional: Positive for fatigue. Negative for appetite change, chills and fever.  HENT:   Negative for hearing loss and voice change.   Eyes: Negative for eye problems.  Respiratory: Negative for chest tightness and cough.   Cardiovascular: Negative for chest pain.  Gastrointestinal: Negative for abdominal distention, abdominal pain and blood in stool.  Endocrine: Negative for hot flashes.  Genitourinary: Negative for bladder incontinence, difficulty urinating, frequency and hematuria.   Musculoskeletal: Negative for arthralgias.  Skin: Negative for itching and rash.  Neurological: Negative for extremity weakness.  Hematological: Negative for adenopathy.  Psychiatric/Behavioral: Negative for confusion.   MEDICAL HISTORY: Past Medical History:  Diagnosis Date  . Dysrhythmia   . Genetic testing 03/28/2017   Multi-Cancer panel (83 genes) @ [facility] - No pathogenic mutations detected  . High grade ovarian cancer ([facility] 11/20/2016  . Pelvic mass in female     SURGICAL HISTORY: Past Surgical History:  Procedure Laterality Date  . APPENDECTOMY    . LAPAROSCOPY N/A 03/14/2017   Procedure: LAPAROSCOPY OPERATIVE;  Surgeon: BMellody Drown MD;  Location: [facility];  Service: Gynecology;  Laterality: N/A;  . LAPAROTOMY N/A 03/14/2017   Procedure: LAPAROTOMY;  Surgeon: BMellody Drown MD;  Location: [facility];  Service: Gynecology;  Laterality: N/A;  . LYMPH NODE DISSECTION N/A 03/14/2017   Procedure: LYMPH NODE DISSECTION;  Surgeon: Mellody Drown, MD;  Location: [facility];  Service: Gynecology;  Laterality: N/A;  . OMENTECTOMY N/A 03/14/2017   Procedure: OMENTECTOMY;  Surgeon: Mellody Drown, MD;  Location: [facility];  Service: Gynecology;  Laterality: N/A;  . PORTA CATH INSERTION N/A 11/27/2016   Procedure: Glori Luis Cath Insertion;  Surgeon: Algernon Huxley, MD;  Location: [place] [facility];  Service: Cardiovascular;  Laterality: N/A;    SOCIAL HISTORY: Social History   Socioeconomic History  . Marital status: Married    Spouse name: Not on file  . Number of children: Not on file  . Years of education: Not on file  . Highest education level: Not on file  Occupational History  . Not on file  Social Needs  . Financial resource strain: Not on file  . Food insecurity    Worry: Not on file    Inability: Not on file  . Transportation needs    Medical: Not on file    Non-medical: Not on file  Tobacco Use  . Smoking status: Never Smoker  . Smokeless tobacco: Never Used  Substance and Sexual Activity  . Alcohol use: No    Frequency: Never    Comment: use to be occ. but not any since 5 months  . Drug use: No  . Sexual activity: Yes    Birth control/protection: Post-menopausal  Lifestyle  . Physical activity    Days per week: Not on file    Minutes per session: Not on file  . Stress: Not on file  Relationships  . Social Herbalist on phone: Not on file    Gets together: Not on file    Attends religious service: Not on file    Active member of club or organization: Not on file    Attends meetings of clubs or organizations: Not on file    Relationship status: Not on file  . Intimate partner violence    Fear of current or ex partner: Not on file    Emotionally abused: Not on file    Physically abused: Not on file    Forced sexual activity: Not on file  Other Topics  Concern  . Not on file  Social History Narrative  . Not on file    FAMILY HISTORY Family History  Problem Relation Age of Onset  . Throat cancer Cousin   . Throat cancer Cousin   . Leukemia Cousin     ALLERGIES:  is allergic to omeprazole.  MEDICATIONS:  Current Outpatient Medications  Medication Sig Dispense Refill  . calcium carbonate (TUMS - DOSED IN MG ELEMENTAL CALCIUM) 500 MG chewable tablet Chew 1 tablet by mouth as needed for indigestion or heartburn.    . lidocaine-prilocaine (EMLA) cream Apply 1 application topically as needed. Apply small amount to port site at least 1 hour prior to it being accessed, cover with plastic wrap 30 g 1  . loperamide (IMODIUM) 2 MG capsule Take 1 capsule (2 mg total) by mouth See admin instructions. With onset of loose stool, take 54m followed by 246mevery 2 hours until 12 hours have passed without loose bowel movement. Maximum: 16 mg/day 120 capsule 1  . olaparib (LYNPARZA) 150 MG tablet Take 2 tablets (300 mg total) by mouth 2 (two) times daily. 120 tablet 2  . ondansetron (ZOFRAN) 8 MG tablet Take 1 tablet (8 mg total) by mouth  2 (two) times daily as needed for refractory nausea / vomiting. Start on day 3 after chemo. 30 tablet 1  . prochlorperazine (COMPAZINE) 10 MG tablet Take 1 tablet (10 mg total) by mouth every 6 (six) hours as needed (Nausea or vomiting). 30 tablet 1  . Tetrahydrozoline HCl (REDNESS RELIEVER EYE DROPS OP) Place 1 drop into both eyes as needed (for red eyes).     . vitamin B-12 (CYANOCOBALAMIN) 1000 MCG tablet Take 1 tablet (1,000 mcg total) by mouth daily. 90 tablet 1   No current facility-administered medications for this visit.     PHYSICAL EXAMINATION:  ECOG PERFORMANCE STATUS: 0 - Asymptomatic Vitals:   09/23/18 1425  BP: 111/69  Pulse: 79  Temp: (!) 97.3 F (36.3 C)    Filed Weights   09/23/18 1425  Weight: 120 lb 14.4 oz (54.8 kg)     Physical Exam  Constitutional: She is oriented to person,  place, and time. No distress.  HENT:  Head: Normocephalic and atraumatic.  Nose: Nose normal.  Mouth/Throat: Oropharynx is clear and moist. No oropharyngeal exudate.  Eyes: Pupils are equal, round, and reactive to light. EOM are normal. No scleral icterus.  Neck: Normal range of motion. Neck supple. No JVD present.  Cardiovascular: Normal rate and regular rhythm.  No murmur heard. Pulmonary/Chest: Effort normal and breath sounds normal. No respiratory distress. She has no rales. She exhibits no tenderness.  Abdominal: Soft. Bowel sounds are normal. She exhibits no distension. There is no abdominal tenderness.  Musculoskeletal: Normal range of motion.        General: No edema.  Lymphadenopathy:    She has no cervical adenopathy.  Neurological: She is alert and oriented to person, place, and time. No cranial nerve deficit. She exhibits normal muscle tone. Coordination normal.  Skin: Skin is warm and dry. She is not diaphoretic. No erythema.  Right anterior medi port + 0.5cm raised erythematous skin lesion on anterior chest wall.   Psychiatric: Affect and judgment normal.       SKIN: LABORATORY DATA: I have personally reviewed the data as listed: CBC    Component Value Date/Time   WBC 2.8 (L) 09/23/2018 1414   RBC 2.37 (L) 09/23/2018 1414   HGB 10.0 (L) 09/23/2018 1414   HCT 28.0 (L) 09/23/2018 1414   PLT 143 (L) 09/23/2018 1414   MCV 118.1 (H) 09/23/2018 1414   MCH 42.2 (H) 09/23/2018 1414   MCHC 35.7 09/23/2018 1414   RDW 14.6 09/23/2018 1414   LYMPHSABS 0.9 09/23/2018 1414   MONOABS 0.2 09/23/2018 1414   EOSABS 0.1 09/23/2018 1414   BASOSABS 0.0 09/23/2018 1414   CMP Latest Ref Rng & Units 09/23/2018 08/29/2018 08/16/2018  Glucose 70 - 99 mg/dL 128(H) 109(H) 109(H)  BUN 8 - 23 mg/dL '12 10 11  ' Creatinine 0.44 - 1.00 mg/dL 0.76 0.74 0.74  Sodium 135 - 145 mmol/L 135 132(L) 134(L)  Potassium 3.5 - 5.1 mmol/L 4.0 3.7 4.3  Chloride 98 - 111 mmol/L 102 101 105  CO2 22 - 32  mmol/L '24 25 23  ' Calcium 8.9 - 10.3 mg/dL 9.9 9.6 9.7  Total Protein 6.5 - 8.1 g/dL 7.5 7.2 7.1  Total Bilirubin 0.3 - 1.2 mg/dL 0.7 0.8 0.8  Alkaline Phos 38 - 126 U/L 62 63 58  AST 15 - 41 U/L '15 16 16  ' ALT 0 - 44 U/L '9 10 9    ' pathology 11/16/2016 Surgical Pathology  CASE: [id number]  PATIENT: Noam Richardson  Surgical  Pathology Report   SPECIMEN SUBMITTED:  A. Retroperitoneal adenopathy, left  DIAGNOSIS:  A. LYMPH NODE, LEFT RETROPERITONEAL; CT-GUIDED CORE BIOPSY:  - METASTATIC HIGH-GRADE SEROUS CARCINOMA.   RADIOGRAPHIC STUDIES: I have personally reviewed the radiological images as listed and agree with the findings in the report CT chest abdomen pelvis with contrast 11/13/2016 IMPRESSION: 1. Large heterogeneous central pelvic mass measures up to 12.6 cm. The lesion generates substantial mass-effect on the uterus, bladder, and pelvic sidewall anatomy. The mass appears to invade the mid sigmoid colon. Etiology of the central pelvic mass is not definitive by CT, but ovarian primary is suspected. The lesion becomes indistinguishable from the posterior uterus on some images and uterine origin is possible, but the uterus does not appear to be the epicenter of the mass and appears to be more displaced by it. MRI of the pelvis without and with contrast may prove helpful to better delineate the relationship of the uterus to the central pelvic mass although it may not be able to definitively localize the origin. 2. Bulky retroperitoneal and left pelvic sidewall lymphadenopathy. The retroperitoneal lymphadenopathy generates substantial mass-effect on the IVC and left renal vein. The external iliac veins along each pelvic sidewall are markedly attenuated by the mass/lymphadenopathy. Patency of these vessels cannot be definitely confirmed on this exam.  Pathology 03/14/2017   DIAGNOSIS:  A. OMENTUM; OMENTECTOMY:  - NO TUMOR SEEN.  - ONE NEGATIVE LYMPH NODE (0/1).   B. RIGHT  FALLOPIAN TUBE AND OVARY; SALPINGO-OOPHORECTOMY:  - SMALL FOCI OF HIGH GRADE SEROUS CARCINOMA INVOLVING THE OVARY.  - MARKED THERAPY RELATED CHANGE.  - NO TUMOR SEEN IN THE FALLOPIAN TUBE.   C. UTERUS, CERVIX, LEFT FALLOPIAN TUBE AND OVARY; HYSTERECTOMY AND LEFT  SALPINGO-OOPHORECTOMY:  - NABOTHIAN CYSTS.  - CYSTIC ATROPHY OF THE ENDOMETRIUM.  - SEROSAL ADHESIONS.  - UNREMARKABLE FALLOPIAN TUBE.  - OVARY SHOWING TREATMENT RELATED CHANGE.   D. 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    RADIOGRAPHIC STUDIES: I have personally reviewed the radiological images as listed and agreed with the findings in the report.  02/28/2018 CT chest abdomen pelvis showed interval enlargement off and aorto caval lymph node which currently measures 1.4 cm in short axis.  This lymph node is in close proximity to the previously resected retroperitoneal lymphadenopathy and it was a concern for additional nodal metastasis.Small lung nodules stable.  08/28/2018 CT chest abdomen pevlis.  . Marked interval decrease in the abdominal aortocaval lymph node of concern on the prior study, measuring 5 mm short axis today compared to 14 mm on the prior study. 2. No new or progressive findings on today's exam to suggest new recurrent or metastatic disease. 3. Stable 6 mm left lower lobe pulmonary nodule.  ASSESSMENT/PLAN Cancer Staging Malignant neoplasm of ovary [facility]) Staging form: Ovary, Fallopian Tube, and Primary Peritoneal Carcinoma, AJCC 8th Edition - Clinical stage from 11/22/2016: Stage IIIC (cT3c, cN1b, cM0) - Signed by Earlie Server, MD on 11/22/2016  1. Malignant neoplasm of ovary, unspecified laterality ([place])   2. Encounter for antineoplastic chemotherapy   3. Macrocytic anemia   4. Drug-induced neutropenia ([place])   : # Ovarian Cancer, local recurrence.  Tolerates olaparib 300 mg twice daily well CA125 continues to rise, most  recent at 111. [name]  She had CT chest abdomen pelvis done on 08/28/2018 which showed no radiographic recurrence She likely has radiographic occult metastasis. Will have short term follow up CT scan in 3 months.  Discussed with [facility] Dr.Berchuck.  #Drug-induced neutropenia, ANC 1.6, stable.   #Macrocytic anemia.  Due to olarparib and vitamin b12 deficiency.  Vitamin B2 deficiency continue  vitamin B12 1034mg daily. B12 level has improved.   # Medi port will need to be flushed every 6 weeks.  Patient is aware about that. #Hyponatremia, chronic. Sodium is stable. Continue to monitor .  RTF in 4 weeks with labs MD assessment.    ZEarlie Server MD, PhD Hematology Oncology CCanyon [facility] LLCat AValdosta Endoscopy Center LLCPager- [phone]/16/20

## 2018-09-26 ENCOUNTER — Other Ambulatory Visit: Payer: Medicare Other

## 2018-09-26 ENCOUNTER — Ambulatory Visit: Payer: Medicare Other | Admitting: Oncology

## 2018-10-21 ENCOUNTER — Inpatient Hospital Stay: Payer: Medicare Other | Attending: Oncology

## 2018-10-21 ENCOUNTER — Encounter: Payer: Self-pay | Admitting: Oncology

## 2018-10-21 ENCOUNTER — Other Ambulatory Visit: Payer: Self-pay

## 2018-10-21 ENCOUNTER — Inpatient Hospital Stay (HOSPITAL_BASED_OUTPATIENT_CLINIC_OR_DEPARTMENT_OTHER): Payer: Medicare Other | Admitting: Oncology

## 2018-10-21 VITALS — BP 97/58 | HR 65 | Temp 97.9°F | Wt 121.2 lb

## 2018-10-21 DIAGNOSIS — E871 Hypo-osmolality and hyponatremia: Secondary | ICD-10-CM | POA: Insufficient documentation

## 2018-10-21 DIAGNOSIS — Z95828 Presence of other vascular implants and grafts: Secondary | ICD-10-CM

## 2018-10-21 DIAGNOSIS — E538 Deficiency of other specified B group vitamins: Secondary | ICD-10-CM | POA: Diagnosis not present

## 2018-10-21 DIAGNOSIS — D6481 Anemia due to antineoplastic chemotherapy: Secondary | ICD-10-CM

## 2018-10-21 DIAGNOSIS — D701 Agranulocytosis secondary to cancer chemotherapy: Secondary | ICD-10-CM

## 2018-10-21 DIAGNOSIS — C569 Malignant neoplasm of unspecified ovary: Secondary | ICD-10-CM | POA: Diagnosis not present

## 2018-10-21 DIAGNOSIS — D539 Nutritional anemia, unspecified: Secondary | ICD-10-CM

## 2018-10-21 DIAGNOSIS — Z5111 Encounter for antineoplastic chemotherapy: Secondary | ICD-10-CM

## 2018-10-21 DIAGNOSIS — D702 Other drug-induced agranulocytosis: Secondary | ICD-10-CM

## 2018-10-21 LAB — CBC WITH DIFFERENTIAL/PLATELET
Abs Immature Granulocytes: 0.02 10*3/uL (ref 0.00–0.07)
Basophils Absolute: 0 10*3/uL (ref 0.0–0.1)
Basophils Relative: 0 %
Eosinophils Absolute: 0.1 10*3/uL (ref 0.0–0.5)
Eosinophils Relative: 3 %
HCT: 28.5 % — ABNORMAL LOW (ref 36.0–46.0)
Hemoglobin: 10 g/dL — ABNORMAL LOW (ref 12.0–15.0)
Immature Granulocytes: 1 %
Lymphocytes Relative: 37 %
Lymphs Abs: 1.1 10*3/uL (ref 0.7–4.0)
MCH: 41.7 pg — ABNORMAL HIGH (ref 26.0–34.0)
MCHC: 35.1 g/dL (ref 30.0–36.0)
MCV: 118.8 fL — ABNORMAL HIGH (ref 80.0–100.0)
Monocytes Absolute: 0.3 10*3/uL (ref 0.1–1.0)
Monocytes Relative: 11 %
Neutro Abs: 1.4 10*3/uL — ABNORMAL LOW (ref 1.7–7.7)
Neutrophils Relative %: 48 %
Platelets: 129 10*3/uL — ABNORMAL LOW (ref 150–400)
RBC: 2.4 MIL/uL — ABNORMAL LOW (ref 3.87–5.11)
RDW: 13.9 % (ref 11.5–15.5)
WBC: 2.9 10*3/uL — ABNORMAL LOW (ref 4.0–10.5)
nRBC: 0 % (ref 0.0–0.2)

## 2018-10-21 LAB — COMPREHENSIVE METABOLIC PANEL
ALT: 8 U/L (ref 0–44)
AST: 14 U/L — ABNORMAL LOW (ref 15–41)
Albumin: 4.5 g/dL (ref 3.5–5.0)
Alkaline Phosphatase: 53 U/L (ref 38–126)
Anion gap: 4 — ABNORMAL LOW (ref 5–15)
BUN: 12 mg/dL (ref 8–23)
CO2: 26 mmol/L (ref 22–32)
Calcium: 9.8 mg/dL (ref 8.9–10.3)
Chloride: 104 mmol/L (ref 98–111)
Creatinine, Ser: 0.73 mg/dL (ref 0.44–1.00)
GFR calc Af Amer: >60 mL/min (ref >60)
GFR calc non Af Amer: >60 mL/min (ref >60)
Glucose, Bld: 101 mg/dL — ABNORMAL HIGH (ref 70–99)
Potassium: 4.3 mmol/L (ref 3.5–5.1)
Sodium: 134 mmol/L — ABNORMAL LOW (ref 135–145)
Total Bilirubin: 0.8 mg/dL (ref 0.3–1.2)
Total Protein: 7.3 g/dL (ref 6.5–8.1)

## 2018-10-21 NOTE — Progress Notes (Signed)
Patient here today for follow up.  Patient states no new concerns today  

## 2018-10-22 LAB — CA 125: Cancer Antigen (CA) 125: 90.5 U/mL — ABNORMAL HIGH (ref 0.0–38.1)

## 2018-10-23 ENCOUNTER — Other Ambulatory Visit: Payer: Self-pay | Admitting: Oncology

## 2018-10-23 DIAGNOSIS — C569 Malignant neoplasm of unspecified ovary: Secondary | ICD-10-CM

## 2018-10-24 NOTE — Progress Notes (Signed)
[facility] Follow up visit  Patient Care Team: Patient, [name], [name], [name], [name], MD as Referring Physician (Obstetrics and Gynecology)  CHIEF COMPLAINTS/PURPOSE OF Visit Follow up for chemotherapy tolerability of  ovarian cancer.  HISTORY OF PRESENTING ILLNESS: Katie Hill 68 y.o. female presents for follow up of management of stage IIIC ovarian cancer. She underwent  Neoadjuvant chemotherapy of carbo and taxol x 4  # Patient had debulking surgery on 03/14/2017. She had a laparoscopy with conversion to laparotomy, total hysterectomy, with bilateral salpingo oophorectomy, right aortic lymph node dissection, omentectomy. Pathology showed small foci of residual disease in ovary.   Genetic testing negative for83 genes on [facility]'s Multi-Cancer panel (ALK, APC, ATM, AXIN2, BAP1, BARD1, BLM, BMPR1A, BRCA1, BRCA2, BRIP1, CASR, CDC73, CDH1, CDK4, CDKN1B, CDKN1C, CDKN2A, CEBPA, CHEK2, CTNNA1, DICER1, DIS3L2, EGFR, EPCAM, FH, FLCN, GATA2, GPC3, GREM1, HOXB13, HRAS, KIT, MAX, MEN1, MET, MITF, MLH1, MSH2, MSH3, MSH6, MUTYH, NBN, NF1, NF2, NTHL1, PALB2, PDGFRA, PHOX2B, PMS2, POLD1, POLE, POT1, PRKAR1A, PTCH1, PTEN, RAD50, RAD51C, RAD51D, RB1, RECQL4, RET, RUNX1, SDHA, SDHAF2, SDHB, SDHC, SDHD, SMAD4, SMARCA4, SMARCB1, SMARCE1, STK11, SUFU, TERC, TERT, TMEM127, TP53, TSC1, TSC2, VHL, WRN, WT1).  A Variant of UncertainSignificancewas detected: CASRc.106G>A (p.Gly36Arg). Myraid testing negative for somatic BRACA1/2, positive for HRD.   # HRD positive, was referred to [facility] for clinical trials of Olarparib maintenance. She opted out.  #  Treatment:  Stage IIIC Ovarian cancer:  #s/p Carboplatin and taxol x 4 neoadjuvant chemotherapy followed by debulking surgery  03/14/2017 # 03/28/2017 S/p Adjuvant carbo and taxol x3   Local recurrence, CA125 one 46.3 03/15/2018-05/17/2018 Carboplatin and Taxol x 4. CA125 decrease from 49.7 to 6 after 4 cycles of treatment.  Started on Olarparib 361m BID on 05/17/2018.   INTERVAL HISTORY 68y.o. female with above oncology history reviewed by me today presents for evaluation for chemotherapy for treatment of ovarian cancer  Patient reports feeling well at baseline.  Denies any nausea, vomiting, diarrhea. She is taking olaparib.  Tolerating well.  Denies any specific side effects. She is concerned about the rising CA125.    Review of Systems  Constitutional: Positive for fatigue. Negative for appetite change, chills and fever.  HENT:   Negative for hearing loss and voice change.   Eyes: Negative for eye problems.  Respiratory: Negative for chest tightness and cough.   Cardiovascular: Negative for chest pain.  Gastrointestinal: Negative for abdominal distention, abdominal pain and blood in stool.  Endocrine: Negative for hot flashes.  Genitourinary: Negative for bladder incontinence, difficulty urinating, frequency and hematuria.   Musculoskeletal: Negative for arthralgias.  Skin: Negative for itching and rash.  Neurological: Negative for extremity weakness.  Hematological: Negative for adenopathy.  Psychiatric/Behavioral: Negative for confusion.   MEDICAL HISTORY: Past Medical History:  Diagnosis Date  . Dysrhythmia   . Genetic testing 03/28/2017   Multi-Cancer panel (83 genes) @ [facility] - No pathogenic mutations detected  . High grade ovarian cancer ([facility] 11/20/2016  . Pelvic mass in female     SURGICAL HISTORY: Past Surgical History:  Procedure Laterality Date  . APPENDECTOMY    . LAPAROSCOPY N/A 03/14/2017   Procedure: LAPAROSCOPY OPERATIVE;  Surgeon: BMellody Drown MD;  Location: [facility];  Service: Gynecology;  Laterality: N/A;  . LAPAROTOMY N/A 03/14/2017   Procedure: LAPAROTOMY;  Surgeon:  Mellody Drown, MD;  Location: [facility];  Service: Gynecology;  Laterality: N/A;  . LYMPH NODE DISSECTION N/A 03/14/2017   Procedure: LYMPH NODE DISSECTION;  Surgeon: Mellody Drown, MD;  Location: [facility];  Service: Gynecology;  Laterality: N/A;  . OMENTECTOMY N/A 03/14/2017   Procedure: OMENTECTOMY;  Surgeon: Mellody Drown, MD;  Location: [facility];  Service: Gynecology;  Laterality: N/A;  . PORTA CATH INSERTION N/A 11/27/2016   Procedure: Glori Luis Cath Insertion;  Surgeon: Algernon Huxley, MD;  Location: [place] [facility];  Service: Cardiovascular;  Laterality: N/A;    SOCIAL HISTORY: Social History   Socioeconomic History  . Marital status: Married    Spouse name: Not on file  . Number of children: Not on file  . Years of education: Not on file  . Highest education level: Not on file  Occupational History  . Not on file  Social Needs  . Financial resource strain: Not on file  . Food insecurity    Worry: Not on file    Inability: Not on file  . Transportation needs    Medical: Not on file    Non-medical: Not on file  Tobacco Use  . Smoking status: Never Smoker  . Smokeless tobacco: Never Used  Substance and Sexual Activity  . Alcohol use: No    Frequency: Never    Comment: use to be occ. but not any since 5 months  . Drug use: No  . Sexual activity: Yes    Birth control/protection: Post-menopausal  Lifestyle  . Physical activity    Days per week: Not on file    Minutes per session: Not on file  . Stress: Not on file  Relationships  . Social Herbalist on phone: Not on file    Gets together: Not on file    Attends religious service: Not on file    Active member of club or organization: Not on file    Attends meetings of clubs or organizations: Not on file    Relationship status: Not on file  . Intimate partner violence    Fear of current or ex partner: Not on file    Emotionally abused: Not on file    Physically abused: Not on file    Forced sexual  activity: Not on file  Other Topics Concern  . Not on file  Social History Narrative  . Not on file    FAMILY HISTORY Family History  Problem Relation Age of Onset  . Throat cancer Cousin   . Throat cancer Cousin   . Leukemia Cousin     ALLERGIES:  is allergic to omeprazole.  MEDICATIONS:  Current Outpatient Medications  Medication Sig Dispense Refill  . calcium carbonate (TUMS - DOSED IN MG ELEMENTAL CALCIUM) 500 MG chewable tablet Chew 1 tablet by mouth as needed for indigestion or heartburn.    . lidocaine-prilocaine (EMLA) cream Apply 1 application topically as needed. Apply small amount to port site at least 1 hour prior to it being accessed, cover with plastic wrap 30 g 1  . loperamide (IMODIUM) 2 MG capsule Take 1 capsule (2 mg total) by mouth See admin instructions. With onset of loose stool, take 28m followed by 255mevery 2 hours until 12 hours have passed without loose bowel movement. Maximum: 16 mg/day 120 capsule 1  . ondansetron (ZOFRAN) 8 MG tablet Take 1 tablet (8 mg total) by mouth 2 (two) times daily as needed for refractory nausea / vomiting. Start on day  3 after chemo. 30 tablet 1  . prochlorperazine (COMPAZINE) 10 MG tablet Take 1 tablet (10 mg total) by mouth every 6 (six) hours as needed (Nausea or vomiting). 30 tablet 1  . Tetrahydrozoline HCl (REDNESS RELIEVER EYE DROPS OP) Place 1 drop into both eyes as needed (for red eyes).     . vitamin B-12 (CYANOCOBALAMIN) 1000 MCG tablet Take 1 tablet (1,000 mcg total) by mouth daily. 90 tablet 1  . LYNPARZA 150 MG tablet TAKE TWO TABLETS BY MOUTH TWICE A DAY 120 tablet 2   No current facility-administered medications for this visit.     PHYSICAL EXAMINATION:  ECOG PERFORMANCE STATUS: 0 - Asymptomatic Vitals:   10/21/18 1344  BP: (!) 97/58  Pulse: 65  Temp: 97.9 F (36.6 C)    Filed Weights   10/21/18 1344  Weight: 121 lb 3 oz (55 kg)     Physical Exam  Constitutional: She is oriented to person, place,  and time. No distress.  HENT:  Head: Normocephalic and atraumatic.  Nose: Nose normal.  Mouth/Throat: Oropharynx is clear and moist. No oropharyngeal exudate.  Eyes: Pupils are equal, round, and reactive to light. EOM are normal. No scleral icterus.  Neck: Normal range of motion. Neck supple. No JVD present.  Cardiovascular: Normal rate and regular rhythm.  No murmur heard. Pulmonary/Chest: Effort normal and breath sounds normal. No respiratory distress. She has no rales. She exhibits no tenderness.  Abdominal: Soft. Bowel sounds are normal. She exhibits no distension. There is no abdominal tenderness.  Musculoskeletal: Normal range of motion.        General: No edema.  Lymphadenopathy:    She has no cervical adenopathy.  Neurological: She is alert and oriented to person, place, and time. No cranial nerve deficit. She exhibits normal muscle tone. Coordination normal.  Skin: Skin is warm and dry. She is not diaphoretic. No erythema.  Right anterior medi port +   Psychiatric: Affect and judgment normal.       SKIN: LABORATORY DATA: I have personally reviewed the data as listed: CBC    Component Value Date/Time   WBC 2.9 (L) 10/21/2018 1253   RBC 2.40 (L) 10/21/2018 1253   HGB 10.0 (L) 10/21/2018 1253   HCT 28.5 (L) 10/21/2018 1253   PLT 129 (L) 10/21/2018 1253   MCV 118.8 (H) 10/21/2018 1253   MCH 41.7 (H) 10/21/2018 1253   MCHC 35.1 10/21/2018 1253   RDW 13.9 10/21/2018 1253   LYMPHSABS 1.1 10/21/2018 1253   MONOABS 0.3 10/21/2018 1253   EOSABS 0.1 10/21/2018 1253   BASOSABS 0.0 10/21/2018 1253   CMP Latest Ref Rng & Units 10/21/2018 09/23/2018 08/29/2018  Glucose 70 - 99 mg/dL 101(H) 128(H) 109(H)  BUN 8 - 23 mg/dL '12 12 10  ' Creatinine 0.44 - 1.00 mg/dL 0.73 0.76 0.74  Sodium 135 - 145 mmol/L 134(L) 135 132(L)  Potassium 3.5 - 5.1 mmol/L 4.3 4.0 3.7  Chloride 98 - 111 mmol/L 104 102 101  CO2 22 - 32 mmol/L '26 24 25  ' Calcium 8.9 - 10.3 mg/dL 9.8 9.9 9.6  Total Protein  6.5 - 8.1 g/dL 7.3 7.5 7.2  Total Bilirubin 0.3 - 1.2 mg/dL 0.8 0.7 0.8  Alkaline Phos 38 - 126 U/L 53 62 63  AST 15 - 41 U/L 14(L) 15 16  ALT 0 - 44 U/L '8 9 10    ' pathology 11/16/2016 Surgical Pathology  CASE: [id number]  PATIENT: Shevon Steinke  Surgical Pathology Report   SPECIMEN SUBMITTED:  A. Retroperitoneal adenopathy, left  DIAGNOSIS:  A. LYMPH NODE, LEFT RETROPERITONEAL; CT-GUIDED CORE BIOPSY:  - METASTATIC HIGH-GRADE SEROUS CARCINOMA.   RADIOGRAPHIC STUDIES: I have personally reviewed the radiological images as listed and agree with the findings in the report CT chest abdomen pelvis with contrast 11/13/2016 IMPRESSION: 1. Large heterogeneous central pelvic mass measures up to 12.6 cm. The lesion generates substantial mass-effect on the uterus, bladder, and pelvic sidewall anatomy. The mass appears to invade the mid sigmoid colon. Etiology of the central pelvic mass is not definitive by CT, but ovarian primary is suspected. The lesion becomes indistinguishable from the posterior uterus on some images and uterine origin is possible, but the uterus does not appear to be the epicenter of the mass and appears to be more displaced by it. MRI of the pelvis without and with contrast may prove helpful to better delineate the relationship of the uterus to the central pelvic mass although it may not be able to definitively localize the origin. 2. Bulky retroperitoneal and left pelvic sidewall lymphadenopathy. The retroperitoneal lymphadenopathy generates substantial mass-effect on the IVC and left renal vein. The external iliac veins along each pelvic sidewall are markedly attenuated by the mass/lymphadenopathy. Patency of these vessels cannot be definitely confirmed on this exam.  Pathology 03/14/2017   DIAGNOSIS:  A. OMENTUM; OMENTECTOMY:  - NO TUMOR SEEN.  - ONE NEGATIVE LYMPH NODE (0/1).   B. RIGHT FALLOPIAN TUBE AND OVARY; SALPINGO-OOPHORECTOMY:  - SMALL FOCI OF HIGH  GRADE SEROUS CARCINOMA INVOLVING THE OVARY.  - MARKED THERAPY RELATED CHANGE.  - NO TUMOR SEEN IN THE FALLOPIAN TUBE.   C. UTERUS, CERVIX, LEFT FALLOPIAN TUBE AND OVARY; HYSTERECTOMY AND LEFT  SALPINGO-OOPHORECTOMY:  - NABOTHIAN CYSTS.  - CYSTIC ATROPHY OF THE ENDOMETRIUM.  - SEROSAL ADHESIONS.  - UNREMARKABLE FALLOPIAN TUBE.  - OVARY SHOWING TREATMENT RELATED CHANGE.   D. 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    RADIOGRAPHIC STUDIES: I have personally reviewed the radiological images as listed and agreed with the findings in the report.  02/28/2018 CT chest abdomen pelvis showed interval enlargement off and aorto caval lymph node which currently measures 1.4 cm in short axis.  This lymph node is in close proximity to the previously resected retroperitoneal lymphadenopathy and it was a concern for additional nodal metastasis.Small lung nodules stable.  08/28/2018 CT chest abdomen pevlis.  . Marked interval decrease in the abdominal aortocaval lymph node of concern on the prior study, measuring 5 mm short axis today compared to 14 mm on the prior study. 2. No new or progressive findings on today's exam to suggest new recurrent or metastatic disease. 3. Stable 6 mm left lower lobe pulmonary nodule.  ASSESSMENT/PLAN Cancer Staging Malignant neoplasm of ovary [facility]) Staging form: Ovary, Fallopian Tube, and Primary Peritoneal Carcinoma, AJCC 8th Edition - Clinical stage from 11/22/2016: Stage IIIC (cT3c, cN1b, cM0) - Signed by Earlie Server, MD on 11/22/2016  1. Malignant neoplasm of ovary, unspecified laterality ([place])   : # Ovarian Cancer, local recurrence.  Tolerates olaparib 300 mg twice daily well CA125 continues to rise, most recent at 111. 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 Patient is concerned about the rising tumor marker. We had a discussion and decision was made to proceed with MRI abdomen pelvis  #Drug-induced neutropenia, ANC 1.4, stable. #Macrocytic anemia,  due to olaparib and vitamin B12 deficiency. #Vitamin B12 deficiency, continue vitamin B12 1000 mcg daily. # Medi port, continue port flush every 6 to 8 weeks.   #Hyponatremia, chronic.  Stable sodium.  Continue to monitor.  Orders Placed This Encounter  Procedures  . MR Abdomen W Wo Contrast    Standing Status:   Future    Standing Expiration Date:   10/21/2019    Order Specific Question:   ** REASON FOR EXAM (FREE TEXT)    Answer:   ovarian cancer, rising CA125    Order Specific Question:   If indicated for the ordered procedure, I authorize the administration of contrast media per Radiology protocol    Answer:   Yes    Order Specific Question:   What is the patient's sedation requirement?    Answer:   No Sedation    Order Specific Question:   Does the patient have a pacemaker or implanted devices?    Answer:   No    Order Specific Question:   Radiology Contrast Protocol - do NOT remove file path    Answer:   \\[email or web address]    Order Specific Question:   Preferred imaging location?    Answer:   [facility] (table limit-400lbs)  . MR PELVIS W WO CONTRAST    Standing Status:   Future    Standing Expiration Date:   12/22/2019    Order Specific Question:   ** REASON FOR EXAM (FREE TEXT)    Answer:   ovarian cancer, rising CA125    Order Specific Question:   If indicated for the ordered procedure, I authorize the administration of contrast media per Radiology protocol    Answer:   Yes    Order Specific Question:   What is the patient's sedation requirement?    Answer:   No Sedation    Order Specific Question:   Does the patient have a pacemaker or implanted devices?    Answer:   No    Order Specific Question:   Radiology Contrast Protocol - do  NOT remove file path    Answer:   \\[email or web address]    Order Specific Question:   Preferred imaging location?    Answer:   [facility] (table limit-400lbs)   RTF in 4 weeks with labs MD assessment.  We spent sufficient time to discuss many aspect of care, questions were answered to patient's satisfaction. Total face to face encounter time for this patient visit was 25 min. >50% of the time was  spent in counseling and coordination of care.    Earlie Server, MD, PhD Hematology Oncology [facility] Pager- [phone] 10/24/18

## 2018-11-05 ENCOUNTER — Ambulatory Visit
Admission: RE | Admit: 2018-11-05 | Discharge: 2018-11-05 | Disposition: A | Discharge status: Home / Self Care | Payer: Medicare Other | Source: Ambulatory Visit | Attending: Oncology | Admitting: Oncology

## 2018-11-05 ENCOUNTER — Other Ambulatory Visit: Payer: Self-pay

## 2018-11-05 DIAGNOSIS — C569 Malignant neoplasm of unspecified ovary: Secondary | ICD-10-CM | POA: Insufficient documentation

## 2018-11-05 MED ORDER — GADOBUTROL 1 MMOL/ML IV SOLN
6.0000 mL | Freq: Once | INTRAVENOUS | Status: AC | PRN
  Administered 2018-11-05: 09:00:00 6 mL via INTRAVENOUS

## 2018-11-18 ENCOUNTER — Inpatient Hospital Stay (HOSPITAL_BASED_OUTPATIENT_CLINIC_OR_DEPARTMENT_OTHER): Payer: Medicare Other | Admitting: Oncology

## 2018-11-18 ENCOUNTER — Encounter: Payer: Self-pay | Admitting: Oncology

## 2018-11-18 ENCOUNTER — Inpatient Hospital Stay: Payer: Medicare Other | Attending: Oncology

## 2018-11-18 ENCOUNTER — Other Ambulatory Visit: Payer: Self-pay

## 2018-11-18 VITALS — BP 116/70 | HR 63 | Temp 98.9°F | Resp 16 | Wt 120.9 lb

## 2018-11-18 DIAGNOSIS — Z90710 Acquired absence of both cervix and uterus: Secondary | ICD-10-CM | POA: Diagnosis not present

## 2018-11-18 DIAGNOSIS — Z95828 Presence of other vascular implants and grafts: Secondary | ICD-10-CM

## 2018-11-18 DIAGNOSIS — E538 Deficiency of other specified B group vitamins: Secondary | ICD-10-CM | POA: Diagnosis not present

## 2018-11-18 DIAGNOSIS — D6481 Anemia due to antineoplastic chemotherapy: Secondary | ICD-10-CM

## 2018-11-18 DIAGNOSIS — R140 Abdominal distension (gaseous): Secondary | ICD-10-CM | POA: Diagnosis not present

## 2018-11-18 DIAGNOSIS — T451X5A Adverse effect of antineoplastic and immunosuppressive drugs, initial encounter: Secondary | ICD-10-CM | POA: Diagnosis not present

## 2018-11-18 DIAGNOSIS — C772 Secondary and unspecified malignant neoplasm of intra-abdominal lymph nodes: Secondary | ICD-10-CM | POA: Diagnosis not present

## 2018-11-18 DIAGNOSIS — C569 Malignant neoplasm of unspecified ovary: Secondary | ICD-10-CM | POA: Diagnosis not present

## 2018-11-18 DIAGNOSIS — Z79899 Other long term (current) drug therapy: Secondary | ICD-10-CM | POA: Insufficient documentation

## 2018-11-18 DIAGNOSIS — Z90722 Acquired absence of ovaries, bilateral: Secondary | ICD-10-CM | POA: Diagnosis not present

## 2018-11-18 DIAGNOSIS — R110 Nausea: Secondary | ICD-10-CM | POA: Insufficient documentation

## 2018-11-18 DIAGNOSIS — D696 Thrombocytopenia, unspecified: Secondary | ICD-10-CM

## 2018-11-18 LAB — CBC WITH DIFFERENTIAL/PLATELET
Abs Immature Granulocytes: 0 10*3/uL (ref 0.00–0.07)
Basophils Absolute: 0 10*3/uL (ref 0.0–0.1)
Basophils Relative: 1 %
Eosinophils Absolute: 0.1 10*3/uL (ref 0.0–0.5)
Eosinophils Relative: 2 %
HCT: 27.5 % — ABNORMAL LOW (ref 36.0–46.0)
Hemoglobin: 9.8 g/dL — ABNORMAL LOW (ref 12.0–15.0)
Immature Granulocytes: 0 %
Lymphocytes Relative: 33 %
Lymphs Abs: 1.1 10*3/uL (ref 0.7–4.0)
MCH: 41.5 pg — ABNORMAL HIGH (ref 26.0–34.0)
MCHC: 35.6 g/dL (ref 30.0–36.0)
MCV: 116.5 fL — ABNORMAL HIGH (ref 80.0–100.0)
Monocytes Absolute: 0.3 10*3/uL (ref 0.1–1.0)
Monocytes Relative: 8 %
Neutro Abs: 1.8 10*3/uL (ref 1.7–7.7)
Neutrophils Relative %: 56 %
Platelets: 125 10*3/uL — ABNORMAL LOW (ref 150–400)
RBC: 2.36 MIL/uL — ABNORMAL LOW (ref 3.87–5.11)
RDW: 14.2 % (ref 11.5–15.5)
WBC: 3.2 10*3/uL — ABNORMAL LOW (ref 4.0–10.5)
nRBC: 0 % (ref 0.0–0.2)

## 2018-11-18 LAB — COMPREHENSIVE METABOLIC PANEL
ALT: 9 U/L (ref 0–44)
AST: 14 U/L — ABNORMAL LOW (ref 15–41)
Albumin: 4.5 g/dL (ref 3.5–5.0)
Alkaline Phosphatase: 57 U/L (ref 38–126)
Anion gap: 7 (ref 5–15)
BUN: 10 mg/dL (ref 8–23)
CO2: 23 mmol/L (ref 22–32)
Calcium: 9.8 mg/dL (ref 8.9–10.3)
Chloride: 103 mmol/L (ref 98–111)
Creatinine, Ser: 0.77 mg/dL (ref 0.44–1.00)
GFR calc Af Amer: >60 mL/min (ref >60)
GFR calc non Af Amer: >60 mL/min (ref >60)
Glucose, Bld: 96 mg/dL (ref 70–99)
Potassium: 3.8 mmol/L (ref 3.5–5.1)
Sodium: 133 mmol/L — ABNORMAL LOW (ref 135–145)
Total Bilirubin: 0.8 mg/dL (ref 0.3–1.2)
Total Protein: 7.1 g/dL (ref 6.5–8.1)

## 2018-11-18 MED ORDER — HEPARIN SOD (PORK) LOCK FLUSH 100 UNIT/ML IV SOLN
500.0000 [IU] | Freq: Once | INTRAVENOUS | Status: AC
  Administered 2018-11-18: 13:00:00 500 [IU] via INTRAVENOUS

## 2018-11-18 MED ORDER — SODIUM CHLORIDE 0.9% FLUSH
10.0000 mL | Freq: Once | INTRAVENOUS | Status: AC
  Administered 2018-11-18: 13:00:00 10 mL via INTRAVENOUS
  Filled 2018-11-18: qty 10

## 2018-11-18 NOTE — Progress Notes (Signed)
[facility] Follow up visit  Patient Care Team: Patient, [name], [name], [name], [name], MD as Referring Physician (Obstetrics and Gynecology)  CHIEF COMPLAINTS/PURPOSE OF Visit Follow up for chemotherapy tolerability of  ovarian cancer.  HISTORY OF PRESENTING ILLNESS: Katie Hill 68 y.o. female presents for follow up of management of stage IIIC ovarian cancer. She underwent  Neoadjuvant chemotherapy of carbo and taxol x 4  # Patient had debulking surgery on 03/14/2017. She had a laparoscopy with conversion to laparotomy, total hysterectomy, with bilateral salpingo oophorectomy, right aortic lymph node dissection, omentectomy. Pathology showed small foci of residual disease in ovary.   Genetic testing negative for83 genes on [facility]'s Multi-Cancer panel (ALK, APC, ATM, AXIN2, BAP1, BARD1, BLM, BMPR1A, BRCA1, BRCA2, BRIP1, CASR, CDC73, CDH1, CDK4, CDKN1B, CDKN1C, CDKN2A, CEBPA, CHEK2, CTNNA1, DICER1, DIS3L2, EGFR, EPCAM, FH, FLCN, GATA2, GPC3, GREM1, HOXB13, HRAS, KIT, MAX, MEN1, MET, MITF, MLH1, MSH2, MSH3, MSH6, MUTYH, NBN, NF1, NF2, NTHL1, PALB2, PDGFRA, PHOX2B, PMS2, POLD1, POLE, POT1, PRKAR1A, PTCH1, PTEN, RAD50, RAD51C, RAD51D, RB1, RECQL4, RET, RUNX1, SDHA, SDHAF2, SDHB, SDHC, SDHD, SMAD4, SMARCA4, SMARCB1, SMARCE1, STK11, SUFU, TERC, TERT, TMEM127, TP53, TSC1, TSC2, VHL, WRN, WT1).  A Variant of UncertainSignificancewas detected: CASRc.106G>A (p.Gly36Arg). Myraid testing negative for somatic BRACA1/2, positive for HRD.   # HRD positive, was referred to [facility] for clinical trials of Olarparib maintenance. She opted out.  #  Treatment:  Stage IIIC Ovarian cancer:  #s/p Carboplatin and taxol x 4 neoadjuvant chemotherapy followed by debulking surgery  03/14/2017 # 03/28/2017 S/p Adjuvant carbo and taxol x3   Local recurrence, CA125 one 46.3 03/15/2018-05/17/2018 Carboplatin and Taxol x 4. CA125 decrease from 49.7 to 6 after 4 cycles of treatment.  Started on Olarparib 364m BID on 05/17/2018.   INTERVAL HISTORY 68y.o. female with above oncology history reviewed by me today presents for evaluation for chemotherapy for treatment of ovarian cancer  Patient reports feeling well at baseline. Denies nausea vomiting or diarrhea. She is taking olaparib.  Tolerating well.  Denies any specific side effects .  Chronic fatigue slightly worse. During the interval MRI abdomen pelvis was obtained due to rising Ca1 25. es any specific side effects. She is concerned about the rising CA125.    Review of Systems  Constitutional: Positive for fatigue. Negative for appetite change, chills and fever.  HENT:   Negative for hearing loss and voice change.   Eyes: Negative for eye problems.  Respiratory: Negative for chest tightness and cough.   Cardiovascular: Negative for chest pain.  Gastrointestinal: Negative for abdominal distention, abdominal pain and blood in stool.  Endocrine: Negative for hot flashes.  Genitourinary: Negative for bladder incontinence, difficulty urinating, frequency and hematuria.   Musculoskeletal: Negative for arthralgias.  Skin: Negative for itching and rash.  Neurological: Negative for extremity weakness.  Hematological: Negative for adenopathy.  Psychiatric/Behavioral: Negative for confusion.   MEDICAL HISTORY: Past Medical History:  Diagnosis Date  . Dysrhythmia   . Genetic testing 03/28/2017   Multi-Cancer panel (83 genes) @ [facility] - No pathogenic mutations detected  . High grade ovarian cancer ([place] 11/20/2016  . Pelvic mass in female     SURGICAL HISTORY: Past Surgical History:  Procedure Laterality Date  . APPENDECTOMY    . LAPAROSCOPY N/A 03/14/2017   Procedure: LAPAROSCOPY OPERATIVE;  Surgeon: BFransisca Connors  Mitzi Hansen, MD;  Location: [facility];  Service: Gynecology;  Laterality: N/A;  . LAPAROTOMY N/A 03/14/2017   Procedure: LAPAROTOMY;  Surgeon: Mellody Drown, MD;  Location: [facility];  Service: Gynecology;  Laterality: N/A;  . LYMPH NODE DISSECTION N/A 03/14/2017   Procedure: LYMPH NODE DISSECTION;  Surgeon: Mellody Drown, MD;  Location: [facility];  Service: Gynecology;  Laterality: N/A;  . OMENTECTOMY N/A 03/14/2017   Procedure: OMENTECTOMY;  Surgeon: Mellody Drown, MD;  Location: [facility];  Service: Gynecology;  Laterality: N/A;  . PORTA CATH INSERTION N/A 11/27/2016   Procedure: Glori Luis Cath Insertion;  Surgeon: Algernon Huxley, MD;  Location: [facility];  Service: Cardiovascular;  Laterality: N/A;    SOCIAL HISTORY: Social History   Socioeconomic History  . Marital status: Married    Spouse name: Not on file  . Number of children: Not on file  . Years of education: Not on file  . Highest education level: Not on file  Occupational History  . Not on file  Social Needs  . Financial resource strain: Not on file  . Food insecurity    Worry: Not on file    Inability: Not on file  . Transportation needs    Medical: Not on file    Non-medical: Not on file  Tobacco Use  . Smoking status: Never Smoker  . Smokeless tobacco: Never Used  Substance and Sexual Activity  . Alcohol use: No    Frequency: Never    Comment: use to be occ. but not any since 5 months  . Drug use: No  . Sexual activity: Yes    Birth control/protection: Post-menopausal  Lifestyle  . Physical activity    Days per week: Not on file    Minutes per session: Not on file  . Stress: Not on file  Relationships  . Social Herbalist on phone: Not on file    Gets together: Not on file    Attends religious service: Not on file    Active member of club or organization: Not on file    Attends meetings of clubs or organizations: Not on file    Relationship status: Not on file  . Intimate partner  violence    Fear of current or ex partner: Not on file    Emotionally abused: Not on file    Physically abused: Not on file    Forced sexual activity: Not on file  Other Topics Concern  . Not on file  Social History Narrative  . Not on file    FAMILY HISTORY Family History  Problem Relation Age of Onset  . Throat cancer Cousin   . Throat cancer Cousin   . Leukemia Cousin     ALLERGIES:  is allergic to omeprazole.  MEDICATIONS:  Current Outpatient Medications  Medication Sig Dispense Refill  . calcium carbonate (TUMS - DOSED IN MG ELEMENTAL CALCIUM) 500 MG chewable tablet Chew 1 tablet by mouth as needed for indigestion or heartburn.    . lidocaine-prilocaine (EMLA) cream Apply 1 application topically as needed. Apply small amount to port site at least 1 hour prior to it being accessed, cover with plastic wrap 30 g 1  . loperamide (IMODIUM) 2 MG capsule Take 1 capsule (2 mg total) by mouth See admin instructions. With onset of loose stool, take 60m followed by 26mevery 2 hours until 12 hours have passed without loose bowel movement. Maximum: 16 mg/day 120 capsule 1  . LYNPARZA 150 MG tablet  TAKE TWO TABLETS BY MOUTH TWICE A DAY 120 tablet 2  . ondansetron (ZOFRAN) 8 MG tablet Take 1 tablet (8 mg total) by mouth 2 (two) times daily as needed for refractory nausea / vomiting. Start on day 3 after chemo. 30 tablet 1  . prochlorperazine (COMPAZINE) 10 MG tablet Take 1 tablet (10 mg total) by mouth every 6 (six) hours as needed (Nausea or vomiting). 30 tablet 1  . Tetrahydrozoline HCl (REDNESS RELIEVER EYE DROPS OP) Place 1 drop into both eyes as needed (for red eyes).     . vitamin B-12 (CYANOCOBALAMIN) 1000 MCG tablet Take 1 tablet (1,000 mcg total) by mouth daily. 90 tablet 1   No current facility-administered medications for this visit.     PHYSICAL EXAMINATION:  ECOG PERFORMANCE STATUS: 0 - Asymptomatic Vitals:   11/18/18 1310  BP: 116/70  Pulse: 63  Resp: 16  Temp: 98.9  F (37.2 C)    Filed Weights   11/18/18 1310  Weight: 120 lb 14.4 oz (54.8 kg)     Physical Exam  Constitutional: She is oriented to person, place, and time. No distress.  HENT:  Head: Normocephalic and atraumatic.  Nose: Nose normal.  Mouth/Throat: Oropharynx is clear and moist. No oropharyngeal exudate.  Eyes: Pupils are equal, round, and reactive to light. EOM are normal. No scleral icterus.  Neck: Normal range of motion. Neck supple. No JVD present.  Cardiovascular: Normal rate and regular rhythm.  No murmur heard. Pulmonary/Chest: Effort normal and breath sounds normal. No respiratory distress. She has no rales. She exhibits no tenderness.  Abdominal: Soft. Bowel sounds are normal. She exhibits no distension. There is no abdominal tenderness.  Musculoskeletal: Normal range of motion.        General: No edema.  Lymphadenopathy:    She has no cervical adenopathy.  Neurological: She is alert and oriented to person, place, and time. No cranial nerve deficit. She exhibits normal muscle tone. Coordination normal.  Skin: Skin is warm and dry. She is not diaphoretic. No erythema.  Right anterior medi port +   Psychiatric: Affect and judgment normal.       SKIN: LABORATORY DATA: I have personally reviewed the data as listed: CBC    Component Value Date/Time   WBC 3.2 (L) 11/18/2018 1256   RBC 2.36 (L) 11/18/2018 1256   HGB 9.8 (L) 11/18/2018 1256   HCT 27.5 (L) 11/18/2018 1256   PLT 125 (L) 11/18/2018 1256   MCV 116.5 (H) 11/18/2018 1256   MCH 41.5 (H) 11/18/2018 1256   MCHC 35.6 11/18/2018 1256   RDW 14.2 11/18/2018 1256   LYMPHSABS 1.1 11/18/2018 1256   MONOABS 0.3 11/18/2018 1256   EOSABS 0.1 11/18/2018 1256   BASOSABS 0.0 11/18/2018 1256   CMP Latest Ref Rng & Units 11/18/2018 10/21/2018 09/23/2018  Glucose 70 - 99 mg/dL 96 101(H) 128(H)  BUN 8 - 23 mg/dL _0 Creatinine 0.44 - 1.00 mg/dL 0.77 0.73 0.76  Sodium 135 - 145 mmol/L 133(L) 134(L) 135  Potassium  3.5 - 5.1 mmol/L 3.8 4.3 4.0  Chloride 98 - 111 mmol/L 103 104 102  CO2 22 - 32 mmol/L _1 Calcium 8.9 - 10.3 mg/dL 9.8 9.8 9.9  Total Protein 6.5 - 8.1 g/dL 7.1 7.3 7.5  Total Bilirubin 0.3 - 1.2 mg/dL 0.8 0.8 0.7  Alkaline Phos 38 - 126 U/L 57 53 62  AST 15 - 41 U/L 14(L) 14(L) 15  ALT 0 - 44 U/L  _0 pathology 11/16/2016 Surgical Pathology  CASE: [id number]  PATIENT: Diantha Lamons  Surgical Pathology Report   SPECIMEN SUBMITTED:  A. Retroperitoneal adenopathy, left  DIAGNOSIS:  A. LYMPH NODE, LEFT RETROPERITONEAL; CT-GUIDED CORE BIOPSY:  - METASTATIC HIGH-GRADE SEROUS CARCINOMA.   RADIOGRAPHIC STUDIES: I have personally reviewed the radiological images as listed and agree with the findings in the report CT chest abdomen pelvis with contrast 11/13/2016 IMPRESSION: 1. Large heterogeneous central pelvic mass measures up to 12.6 cm. The lesion generates substantial mass-effect on the uterus, bladder, and pelvic sidewall anatomy. The mass appears to invade the mid sigmoid colon. Etiology of the central pelvic mass is not definitive by CT, but ovarian primary is suspected. The lesion becomes indistinguishable from the posterior uterus on some images and uterine origin is possible, but the uterus does not appear to be the epicenter of the mass and appears to be more displaced by it. MRI of the pelvis without and with contrast may prove helpful to better delineate the relationship of the uterus to the central pelvic mass although it may not be able to definitively localize the origin. 2. Bulky retroperitoneal and left pelvic sidewall lymphadenopathy. The retroperitoneal lymphadenopathy generates substantial mass-effect on the IVC and left renal vein. The external iliac veins along each pelvic sidewall are markedly attenuated by the mass/lymphadenopathy. Patency of these vessels cannot be definitely confirmed on this exam.  Pathology 03/14/2017   DIAGNOSIS:  A. OMENTUM;  OMENTECTOMY:  - NO TUMOR SEEN.  - ONE NEGATIVE LYMPH NODE (0/1).   B. RIGHT FALLOPIAN TUBE AND OVARY; SALPINGO-OOPHORECTOMY:  - SMALL FOCI OF HIGH GRADE SEROUS CARCINOMA INVOLVING THE OVARY.  - MARKED THERAPY RELATED CHANGE.  - NO TUMOR SEEN IN THE FALLOPIAN TUBE.   C. UTERUS, CERVIX, LEFT FALLOPIAN TUBE AND OVARY; HYSTERECTOMY AND LEFT  SALPINGO-OOPHORECTOMY:  - NABOTHIAN CYSTS.  - CYSTIC ATROPHY OF THE ENDOMETRIUM.  - SEROSAL ADHESIONS.  - UNREMARKABLE FALLOPIAN TUBE.  - OVARY SHOWING TREATMENT RELATED CHANGE.   D. 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    RADIOGRAPHIC STUDIES: I have personally reviewed the radiological images as listed and agreed with the findings in the report.  02/28/2018 CT chest abdomen pelvis showed interval enlargement off and aorto caval lymph node which currently measures 1.4 cm in short axis.  This lymph node is in close proximity to the previously resected retroperitoneal lymphadenopathy and it was a concern for additional nodal metastasis.Small lung nodules stable.  08/28/2018 CT chest abdomen pevlis.  . Marked interval decrease in the abdominal aortocaval lymph node of concern on the prior study, measuring 5 mm short axis today compared to 14 mm on the prior study. 2. No new or progressive findings on today's exam to suggest new recurrent or metastatic disease. 3. Stable 6 mm left lower lobe pulmonary nodule.  ASSESSMENT/PLAN Cancer Staging Malignant neoplasm of ovary [facility]) Staging form: Ovary, Fallopian Tube, and Primary Peritoneal Carcinoma, AJCC 8th Edition - Clinical stage from 11/22/2016: Stage IIIC (cT3c, cN1b, cM0) - Signed by Earlie Server, MD on 11/22/2016  1. Malignant neoplasm of ovary, unspecified laterality ([place])   2. Port-A-Cath in place   3. Anemia associated with chemotherapy   4. Thrombocytopenia ([place])   : # Ovarian Cancer, local recurrence.  Tolerates  olaparib 300 mg twice daily well Ca1 25 continue to rise, most recent reading was at 90.5. Today's CEA 125 is pending.   Patient had MRI abd  and pelvis was done on 11/05/2018. Images were independently reviewed by me and discussed with patient. There is interval progression of the retro-caval lymph nodes in the abdomen, now measuring 2.7 x 2.3 cm concerning for progression of metastatic disease. Aorto caval lymph node seen as decreased on prior study is stable. 3.3 x 1.6 cm homogeneous lesion posterior right pelvis is smaller than 02/28/2018. Possible disease progression was discussed with patient. I have previously discussed with Dr. Theora Gianotti regarding rising CA1 25. Patient has follow-up appointment with GYN oncology for further discussion of clinical trial.  #Chronic anemia, hemoglobin 9.8, macrocytic secondary to prolonged period. #Thrombocytopenia, 1 25,000.  Stable. Since he progressed on olaparib.  Advised patient to discontinue olaparib  #Vitamin B12 deficiency, continue B12 1000 MCG daily .  Mediport continue port flush every 6 to 8 weeks  RTF to be determined.   We spent sufficient time to discuss many aspect of care, questions were answered to patient's satisfaction. Total face to face encounter time for this patient visit was 25 min. >50% of the time was  spent in counseling and coordination of care.     Earlie Server, MD, PhD Hematology Oncology [facility] Pager- [phone] 11/18/18

## 2018-11-18 NOTE — Progress Notes (Signed)
Patient does not offer any problems today and is here to discuss test results.

## 2018-11-19 ENCOUNTER — Other Ambulatory Visit: Payer: Self-pay

## 2018-11-19 LAB — CA 125: Cancer Antigen (CA) 125: 69 U/mL — ABNORMAL HIGH (ref 0.0–38.1)

## 2018-11-20 ENCOUNTER — Other Ambulatory Visit: Payer: Self-pay

## 2018-11-20 ENCOUNTER — Inpatient Hospital Stay (HOSPITAL_BASED_OUTPATIENT_CLINIC_OR_DEPARTMENT_OTHER): Payer: Medicare Other | Admitting: Obstetrics and Gynecology

## 2018-11-20 ENCOUNTER — Ambulatory Visit: Payer: Medicare Other

## 2018-11-20 ENCOUNTER — Inpatient Hospital Stay: Payer: Medicare Other

## 2018-11-20 VITALS — BP 109/54 | HR 65 | Temp 98.4°F | Resp 20 | Ht 68.0 in | Wt 121.1 lb

## 2018-11-20 DIAGNOSIS — C772 Secondary and unspecified malignant neoplasm of intra-abdominal lymph nodes: Secondary | ICD-10-CM | POA: Diagnosis not present

## 2018-11-20 DIAGNOSIS — C569 Malignant neoplasm of unspecified ovary: Secondary | ICD-10-CM | POA: Diagnosis not present

## 2018-11-20 DIAGNOSIS — R140 Abdominal distension (gaseous): Secondary | ICD-10-CM | POA: Diagnosis not present

## 2018-11-20 DIAGNOSIS — Z90710 Acquired absence of both cervix and uterus: Secondary | ICD-10-CM | POA: Diagnosis not present

## 2018-11-20 DIAGNOSIS — Z90722 Acquired absence of ovaries, bilateral: Secondary | ICD-10-CM | POA: Diagnosis not present

## 2018-11-20 DIAGNOSIS — Z9221 Personal history of antineoplastic chemotherapy: Secondary | ICD-10-CM

## 2018-11-20 DIAGNOSIS — Z79899 Other long term (current) drug therapy: Secondary | ICD-10-CM | POA: Diagnosis not present

## 2018-11-20 MED ORDER — OLAPARIB 150 MG PO TABS: 300.0000 mg | ORAL_TABLET | Freq: Two times a day (BID) | ORAL | 2 refills | Status: DC

## 2018-11-20 NOTE — Progress Notes (Signed)
Gynecologic Oncology Interval Visit   Referring Provider: Dr. 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 G0P0 female with locally recurrent advanced ovarian cancer, currently on olaparib, who presents to clinic for follow up.   She is s/p 4 cycles of neoadjuvant chemotherapy followed by interval debulking to no gross residual disease followed by 3 cycles of adjuvant carbo-taxol completed 05/09/2017. In November 2019 she had local recurrence and received 3 cycles of carbo-taxol 03/15/2018 - 04/26/2018. She is HRD positive and is currently on olaparib since 2/20.  CA125 was rising up to 111 in June from baseline of 7, but down to 90 in July and most recently 69.  Today, she reports overall feeling well. She continues to have some abdominal bloating and intermittent nausea but doesn't feel bothered by these enough to take anything for her symptoms.      11/04/18 MRI Abd/Pelvis IMPRESSION: 1. Interval progression of a retrocaval lymph node in the abdomen, now measuring 2.7 x 2.3 cm and concerning for progression of metastatic disease. 2. Aortocaval lymph node seen as decreased on the prior study is stable. This is unenlarged by MR criteria. 3. 3.3 x 1.6 cm homogeneous lesion posterior right pelvis is smaller than 02/28/2018. This is homogeneous and shows thin, smooth peripheral enhancement with no evidence for central enhancement and likely represents a chronic postoperative collection although metastatic disease not entirely excluded.  Oncology History: Katie Hill is a pleasant. G0P0 female with advanced ovarian cancer.  She presented to the ER 11/13/2016 with symptoms of right lower quadrant pain for 3-4 weeks, associated with nausea, bloating, no change in weight, urinary frequency, and change in bowel habits with more diffucult and stringy BM's.  CT CHEST, ABDOMEN, AND PELVIS WITH  CONTRAST IMPRESSION: 1. Large heterogeneous central pelvic mass measures up to 12.6 cm. The lesion generates substantial mass-effect on the uterus, bladder, and pelvic sidewall anatomy. The mass appears to invade the mid sigmoid colon. Etiology of the central pelvic mass is not definitive by CT, but ovarian primary is suspected. The lesion becomes indistinguishable from the posterior uterus on some images and uterine origin is possible, but the uterus does not appear to be the epicenter of the mass and appears to be more displaced by it. MRI of the pelvis without and with contrast may prove helpful to better delineate the relationship of the uterus to the central pelvic mass although it may not be able to definitively localize the origin. 2. Bulky retroperitoneal and left pelvic sidewall lymphadenopathy. The retroperitoneal lymphadenopathy generates substantial mass-effect on the IVC and left renal vein. The external iliac veins along each pelvic sidewall are markedly attenuated by the mass/lymphadenopathy. Patency of these vessels cannot be definitely confirmed on this exam.  11/16/16 LYMPH NODE, LEFT RETROPERITONEAL; CT-GUIDED CORE BIOPSY:  - METASTATIC HIGH-GRADE SEROUS CARCINOMA.   Decision was made to do neoadjuvant carbo/taxol chemotherapy.  CA125 fell from 4,382 to 64.6 (10/29).  CT scan showed dramatic response.  CT scan 10/14 Vascular/Lymphatic: Normal appearance of the abdominal aorta. Interval decrease in size of previously identified bulky retroperitoneal adenopathy. Index aortocaval node measures 1.6 x 2.6 cm, image 32 of series 2. Previously 4.4 x 5.3 cm. Index left periaortic nodal mass measures 2.2 x 1.3 cm, image 31 of series 2. Previously 3.7 x 4.7 cm. At the level of the bifurcation there is a low-attenuation nodal mass which measure 3.1 x 2.6 cm, image  44 of series 2. Previously 4.3 x 4.8 cm. Left common iliac node measures 1.2 cm, image 53 of series 2. Previously 2.2 cm. Left  external iliac node measures 1.6 x 1.1 cm, image 67 of series 2. Previously 4.8 x 2.6 cm. Reproductive: Large mass centered around the uterus measures 9.4 x 5.5 cm, image 67 of series 2. Previously this measured 12.6 x 9.2 cm  On 03/14/2017 she underwent L/S converted to XL, TAH, BSO, right PA node resection, omentectomy, and LOA to no residual disease.   Pathology 03/14/2017  DIAGNOSIS:  A. OMENTUM; OMENTECTOMY:  - NO TUMOR SEEN.  - ONE NEGATIVE LYMPH NODE (0/1).   B. RIGHT FALLOPIAN TUBE AND OVARY; SALPINGO-OOPHORECTOMY:  - SMALL FOCI OF HIGH GRADE SEROUS CARCINOMA INVOLVING THE OVARY.  - MARKED THERAPY RELATED CHANGE.  - NO TUMOR SEEN IN THE FALLOPIAN TUBE.   C. UTERUS, CERVIX, LEFT FALLOPIAN TUBE AND OVARY; HYSTERECTOMY AND LEFT  SALPINGO-OOPHORECTOMY:  - NABOTHIAN CYSTS.  - CYSTIC ATROPHY OF THE ENDOMETRIUM.  - SEROSAL ADHESIONS.  - UNREMARKABLE FALLOPIAN TUBE.  - OVARY SHOWING TREATMENT RELATED CHANGE.   D. 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   Then completed an additional 3 cycles of carbo/Taxol and CA 125 was 7.4 05/09/17.  CA 125  14 in 5/19  21 in 8/19.   CT scan 11/22/17 IMPRESSION: 1. Interval debulking surgery with decrease in retroperitoneal and extraperitoneal lymphadenopathy. In some areas the lymphadenopathy has resolved completely. Peritoneal implant seen along the falciform ligament on the prior study has decreased. 2. 4 x 2 cm collection of complex fluid or soft tissue in the right cul-de-sac is in the region of the complex mass lesion seen on the previous study. Given that there is some apparent enhancement peripherally, residual/recurrent disease is a concern and close attention will be required. 3. Stable tiny bilateral pulmonary nodules with no pleural effusion.  CA125  25.7 12/24/17 46.3 02/18/18  02/28/2018- CT C/A/P  1. Compared to the prior examination there has been  interval enlargement of an aortocaval lymph node which currently measures 1.4 cm in short axis (axial image 75 of series 2). This lymph node is in close proximity to the previously resected retroperitoneal lymphadenopathy and is very concerning for an additional nodal metastasis. 2. Other findings in the chest, abdomen and pelvis appear very similar to prior examinations, as discussed above. 3. Aortic atherosclerosis.  She received 3 cycles of carbo-Taxol 03/15/2018-04/26/2018. CA 125 improved to 6.0. She started olaparib 300 mg BID for platinum-sensitive recurrent ovarian cancer.   CA125 04/05/2018    15.3 04/26/2018       6.3 05/17/2018         6.0    Genetic testing negative for 83 genes on [facility]'s Multi-Cancer panel (ALK, APC, ATM, AXIN2, BAP1, BARD1, BLM, BMPR1A, BRCA1, BRCA2, BRIP1, CASR, CDC73, CDH1, CDK4, CDKN1B, CDKN1C, CDKN2A, CEBPA, CHEK2, CTNNA1, DICER1, DIS3L2, EGFR, EPCAM, FH, FLCN, GATA2, GPC3, GREM1, HOXB13, HRAS, KIT, MAX, MEN1, MET, MITF, MLH1, MSH2, MSH3, MSH6, MUTYH, NBN, NF1, NF2, NTHL1, PALB2, PDGFRA, PHOX2B, PMS2, POLD1, POLE, POT1, PRKAR1A, PTCH1, PTEN, RAD50, RAD51C, RAD51D, RB1, RECQL4, RET, RUNX1, SDHA, SDHAF2, SDHB, SDHC, SDHD, SMAD4, SMARCA4, SMARCB1, SMARCE1, STK11, SUFU, TERC, TERT, TMEM127, TP53, TSC1, TSC2, VHL, WRN, WT1).  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 This is still considered a normal result.   Somatic tumor testing:  She has HRD positive cancer, negative for somatic or germline BRCA mutation.  Patient Active Problem List   Diagnosis Date Noted  . Goals of care, counseling/discussion 03/09/2018  . Genetic testing 03/28/2017  . S/P total abdominal hysterectomy and bilateral salpingo-oophorectomy 03/14/2017  . Hyponatremia 12/12/2016  . Dehydration 12/08/2016  . Malignant neoplasm of ovary ([place]) 11/20/2016  . Pelvic mass 11/15/2016   Past Medical History:  Diagnosis Date  . Dysrhythmia   . Genetic testing  03/28/2017   Multi-Cancer panel (83 genes) @ [facility] - No pathogenic mutations detected  . High grade ovarian cancer ([place]) 11/20/2016  . Pelvic mass in female    Past Surgical History:  Procedure Laterality Date  . APPENDECTOMY    . LAPAROSCOPY N/A 03/14/2017   Procedure: LAPAROSCOPY OPERATIVE;  Surgeon: Mellody Drown, MD;  Location: [facility];  Service: Gynecology;  Laterality: N/A;  . LAPAROTOMY N/A 03/14/2017   Procedure: LAPAROTOMY;  Surgeon: Mellody Drown, MD;  Location: [facility];  Service: Gynecology;  Laterality: N/A;  . LYMPH NODE DISSECTION N/A 03/14/2017   Procedure: LYMPH NODE DISSECTION;  Surgeon: Mellody Drown, MD;  Location: [facility];  Service: Gynecology;  Laterality: N/A;  . OMENTECTOMY N/A 03/14/2017   Procedure: OMENTECTOMY;  Surgeon: Mellody Drown, MD;  Location: [facility];  Service: Gynecology;  Laterality: N/A;  . PORTA CATH INSERTION N/A 11/27/2016   Procedure: Glori Luis Cath Insertion;  Surgeon: Algernon Huxley, MD;  Location: [facility];  Service: Cardiovascular;  Laterality: N/A;   Past Gynecologic History:  Menarche: 17 Last Menstrual Period: 24 years ago History of Abnormal pap: no Last pap: years ago She does not have regular medical cancer and has not has screening mammogram, colonoscopy, or Pap smears. She has not had an abnormal Pap.   OB History    Gravida  0   Para  0   Term  0   Preterm  0   AB  0   Living  0     SAB  0   TAB  0   Ectopic  0   Multiple  0   Live Births  0          Family History  Problem Relation Age of Onset  . Throat cancer Cousin   . Throat cancer Cousin   . Leukemia Cousin    Social History   Socioeconomic History  . Marital status: Married    Spouse name: Not on file  . Number of children: Not on file  . Years of education: Not on file  . Highest education level: Not on file  Occupational History  . Not on file  Social Needs  . Financial resource strain: Not on file  . Food  insecurity    Worry: Not on file    Inability: Not on file  . Transportation needs    Medical: Not on file    Non-medical: Not on file  Tobacco Use  . Smoking status: Never Smoker  . Smokeless tobacco: Never Used  Substance and Sexual Activity  . Alcohol use: No    Frequency: Never    Comment: use to be occ. but not any since 5 months  . Drug use: No  . Sexual activity: Yes    Birth control/protection: Post-menopausal  Lifestyle  . Physical activity    Days per week: Not on file    Minutes per session: Not on file  . Stress: Not on file  Relationships  . Social Herbalist on phone: Not on file    Gets together: Not  on file    Attends religious service: Not on file    Active member of club or organization: Not on file    Attends meetings of clubs or organizations: Not on file    Relationship status: Not on file  Other Topics Concern  . Not on file  Social History Narrative  . Not on file   Allergies  Allergen Reactions  . Omeprazole Rash   Current Outpatient Medications on File Prior to Visit  Medication Sig Dispense Refill  . calcium carbonate (TUMS - DOSED IN MG ELEMENTAL CALCIUM) 500 MG chewable tablet Chew 1 tablet by mouth as needed for indigestion or heartburn.    . lidocaine-prilocaine (EMLA) cream Apply 1 application topically as needed. Apply small amount to port site at least 1 hour prior to it being accessed, cover with plastic wrap 30 g 1  . loperamide (IMODIUM) 2 MG capsule Take 1 capsule (2 mg total) by mouth See admin instructions. With onset of loose stool, take 30m followed by 280mevery 2 hours until 12 hours have passed without loose bowel movement. Maximum: 16 mg/day 120 capsule 1  . ondansetron (ZOFRAN) 8 MG tablet Take 1 tablet (8 mg total) by mouth 2 (two) times daily as needed for refractory nausea / vomiting. Start on day 3 after chemo. 30 tablet 1  . prochlorperazine (COMPAZINE) 10 MG tablet Take 1 tablet (10 mg total) by mouth every 6  (six) hours as needed (Nausea or vomiting). 30 tablet 1  . Tetrahydrozoline HCl (REDNESS RELIEVER EYE DROPS OP) Place 1 drop into both eyes as needed (for red eyes).     . vitamin B-12 (CYANOCOBALAMIN) 1000 MCG tablet Take 1 tablet (1,000 mcg total) by mouth daily. 90 tablet 1  . [DISCONTINUED] olaparib (LYNPARZA) 150 MG tablet Take 2 tablets (300 mg total) by mouth 2 (two) times daily. 120 tablet 2   No current facility-administered medications on file prior to visit.    Review of Systems General:  no complaints Skin: no complaints Eyes: no complaints HEENT: no complaints Breasts: no complaints Pulmonary: no complaints Cardiac: no complaints Gastrointestinal: nausea & bloating  Genitourinary/Sexual: no complaints Ob/Gyn: no complaints Musculoskeletal: no complaints Hematology: no complaints Neurologic/Psych: no complaints   Objective:  Physical Examination:  Today's Vitals   11/20/18 0839  BP: (!) 109/54  Pulse: 65  Resp: 20  Temp: 98.4 F (36.9 C)  TempSrc: Tympanic  Weight: 121 lb 1.6 oz (54.9 kg)  Height: _0  (1.727 m)   Body mass index is 18.41 kg/m.  ECOG Performance Status: 1 - Symptomatic but completely ambulatory  GENERAL: thin built, female. No acute distress.  HEENT:  Sclera clear. Anicteric. Alopecia.  ABDOMEN:  Soft, nontender.  No hernias, incisions well healed. No masses or ascites EXTREMITIES:  No peripheral edema. Atraumatic. No cyanosis  NEURO:  Nonfocal. Well oriented.  Appropriate affect.  Pelvic: exam chaperoned by nurse;   Vulva: normal appearing vulva with no masses, tenderness or lesions;  Vagina: normal vagina, cuff healed.  Adnexa/Uterus/Cervix: surgically absent;  BME negative for masses or nodularity.  Rectal: confirmed.  Assessment:  ElReem Fleurys a [age].o. female diagnosed with high grade serous ovarian cancer (HRD) on CT directed node biopsy with partial bowel obstruction and extensive bulky adenopathy 8/18. s/p 4  cycles of carbo/taxol chemotherapy with dramatic decline in CA125 and improvement on CT scan. Interval debulking surgery to no gross residual on 03/14/2017.  Reinitiation of chemotherapy on 03/28/2017 with normalization of CA125.  Platinum-sensitive recurrence with normalization with re-induction of carbo-Taxol 03/15/2018-04/26/2018. On olaparib since 2/20 for HRD positive cancer and CA125 was rising, but now falling again and she is doing well clinically.  MRI shows slight progression.   She does not have a germline mutation in BRCA1/2 or other related gene, but somatic tumor testing is positive for HRD.     Medical co-morbidities complicating care: prior abdominal surgery.  Plan:   Problem List Items Addressed This Visit      Genitourinary   Malignant neoplasm of ovary ([place]) - Primary   In view of CA125 falling again will continue olaparib. She will see Dr. Tasia Catchings for monthly evaluation on olaparib. She is currently doing well other than mild GI complaints.  If she has more significant progression of disease she would be treated with other FDA approved drugs including doxil, gemcitabine and Avastin.  Also could be a candidate for clinical trial.  A total of 25 minutes were spent with the patient/family today; >50% was spent in education, counseling and coordination of care for ovarian cancer.  Beckey Rutter, DNP, AGNP-C [place] [facility] [phone] (work cell) [phone] (office)  I personally interviewed and examined the patient. Agreed with the above/below plan of care. Patient/family questions were answered.  Mellody Drown, MD   CC:  Gae Dry,  MD [address] [place], [place] [address]  Dr. Tasia Catchings

## 2018-11-26 ENCOUNTER — Telehealth: Payer: Self-pay | Admitting: *Deleted

## 2018-11-26 NOTE — Telephone Encounter (Signed)
Call returned to patient to answer questions regarding medications and follow up. Patient states Dr. Tasia Catchings stopped Katie Hill on 8/10 and then it was restarted on 8/12 by Gyn Onc after deciding patient would not be starting on trial at [facility]. She also did not have follow up so was concerned, I advised her that follow up for lab/MD had been set for 9/9. Appt details given to patient and she verbalized understanding.

## 2018-11-26 NOTE — Telephone Encounter (Signed)
Thank you :)

## 2018-11-27 ENCOUNTER — Ambulatory Visit: Payer: Medicare Other

## 2018-12-17 ENCOUNTER — Other Ambulatory Visit: Payer: Self-pay

## 2018-12-18 ENCOUNTER — Other Ambulatory Visit: Payer: Self-pay

## 2018-12-18 ENCOUNTER — Inpatient Hospital Stay: Payer: Medicare Other | Attending: Oncology

## 2018-12-18 ENCOUNTER — Encounter: Payer: Self-pay | Admitting: Oncology

## 2018-12-18 ENCOUNTER — Inpatient Hospital Stay (HOSPITAL_BASED_OUTPATIENT_CLINIC_OR_DEPARTMENT_OTHER): Payer: Medicare Other | Admitting: Oncology

## 2018-12-18 VITALS — BP 112/59 | HR 66 | Temp 98.3°F | Resp 16 | Ht 68.0 in | Wt 122.9 lb

## 2018-12-18 DIAGNOSIS — C569 Malignant neoplasm of unspecified ovary: Secondary | ICD-10-CM | POA: Insufficient documentation

## 2018-12-18 DIAGNOSIS — Z5111 Encounter for antineoplastic chemotherapy: Secondary | ICD-10-CM | POA: Diagnosis not present

## 2018-12-18 DIAGNOSIS — D6481 Anemia due to antineoplastic chemotherapy: Secondary | ICD-10-CM | POA: Insufficient documentation

## 2018-12-18 DIAGNOSIS — T451X5A Adverse effect of antineoplastic and immunosuppressive drugs, initial encounter: Secondary | ICD-10-CM | POA: Insufficient documentation

## 2018-12-18 DIAGNOSIS — E538 Deficiency of other specified B group vitamins: Secondary | ICD-10-CM | POA: Insufficient documentation

## 2018-12-18 DIAGNOSIS — Z95828 Presence of other vascular implants and grafts: Secondary | ICD-10-CM

## 2018-12-18 DIAGNOSIS — D702 Other drug-induced agranulocytosis: Secondary | ICD-10-CM

## 2018-12-18 DIAGNOSIS — D696 Thrombocytopenia, unspecified: Secondary | ICD-10-CM | POA: Diagnosis not present

## 2018-12-18 DIAGNOSIS — D701 Agranulocytosis secondary to cancer chemotherapy: Secondary | ICD-10-CM | POA: Diagnosis not present

## 2018-12-18 LAB — CBC WITH DIFFERENTIAL/PLATELET
Abs Immature Granulocytes: 0.01 10*3/uL (ref 0.00–0.07)
Basophils Absolute: 0 10*3/uL (ref 0.0–0.1)
Basophils Relative: 0 %
Eosinophils Absolute: 0.1 10*3/uL (ref 0.0–0.5)
Eosinophils Relative: 2 %
HCT: 29.3 % — ABNORMAL LOW (ref 36.0–46.0)
Hemoglobin: 10.1 g/dL — ABNORMAL LOW (ref 12.0–15.0)
Immature Granulocytes: 0 %
Lymphocytes Relative: 29 %
Lymphs Abs: 0.7 10*3/uL (ref 0.7–4.0)
MCH: 40.7 pg — ABNORMAL HIGH (ref 26.0–34.0)
MCHC: 34.5 g/dL (ref 30.0–36.0)
MCV: 118.1 fL — ABNORMAL HIGH (ref 80.0–100.0)
Monocytes Absolute: 0.3 10*3/uL (ref 0.1–1.0)
Monocytes Relative: 10 %
Neutro Abs: 1.5 10*3/uL — ABNORMAL LOW (ref 1.7–7.7)
Neutrophils Relative %: 59 %
Platelets: 124 10*3/uL — ABNORMAL LOW (ref 150–400)
RBC: 2.48 MIL/uL — ABNORMAL LOW (ref 3.87–5.11)
RDW: 13.7 % (ref 11.5–15.5)
WBC: 2.5 10*3/uL — ABNORMAL LOW (ref 4.0–10.5)
nRBC: 0 % (ref 0.0–0.2)

## 2018-12-18 LAB — COMPREHENSIVE METABOLIC PANEL
ALT: 8 U/L (ref 0–44)
AST: 14 U/L — ABNORMAL LOW (ref 15–41)
Albumin: 4.5 g/dL (ref 3.5–5.0)
Alkaline Phosphatase: 56 U/L (ref 38–126)
Anion gap: 8 (ref 5–15)
BUN: 12 mg/dL (ref 8–23)
CO2: 24 mmol/L (ref 22–32)
Calcium: 9.8 mg/dL (ref 8.9–10.3)
Chloride: 103 mmol/L (ref 98–111)
Creatinine, Ser: 0.85 mg/dL (ref 0.44–1.00)
GFR calc Af Amer: >60 mL/min (ref >60)
GFR calc non Af Amer: >60 mL/min (ref >60)
Glucose, Bld: 102 mg/dL — ABNORMAL HIGH (ref 70–99)
Potassium: 3.9 mmol/L (ref 3.5–5.1)
Sodium: 135 mmol/L (ref 135–145)
Total Bilirubin: 0.7 mg/dL (ref 0.3–1.2)
Total Protein: 6.8 g/dL (ref 6.5–8.1)

## 2018-12-18 NOTE — Progress Notes (Signed)
[facility] Follow up visit  Patient Care Team: Patient, [name], [name], [name], [name], MD as Referring Physician (Obstetrics and Gynecology)  CHIEF COMPLAINTS/PURPOSE OF Visit Follow up for chemotherapy tolerability of  ovarian cancer.  HISTORY OF PRESENTING ILLNESS: Katie Hill 68 y.o. female presents for follow up of management of stage IIIC ovarian cancer. She underwent  Neoadjuvant chemotherapy of carbo and taxol x 4  # Patient had debulking surgery on 03/14/2017. She had a laparoscopy with conversion to laparotomy, total hysterectomy, with bilateral salpingo oophorectomy, right aortic lymph node dissection, omentectomy. Pathology showed small foci of residual disease in ovary.   Genetic testing negative for83 genes on [facility]'s Multi-Cancer panel (ALK, APC, ATM, AXIN2, BAP1, BARD1, BLM, BMPR1A, BRCA1, BRCA2, BRIP1, CASR, CDC73, CDH1, CDK4, CDKN1B, CDKN1C, CDKN2A, CEBPA, CHEK2, CTNNA1, DICER1, DIS3L2, EGFR, EPCAM, FH, FLCN, GATA2, GPC3, GREM1, HOXB13, HRAS, KIT, MAX, MEN1, MET, MITF, MLH1, MSH2, MSH3, MSH6, MUTYH, NBN, NF1, NF2, NTHL1, PALB2, PDGFRA, PHOX2B, PMS2, POLD1, POLE, POT1, PRKAR1A, PTCH1, PTEN, RAD50, RAD51C, RAD51D, RB1, RECQL4, RET, RUNX1, SDHA, SDHAF2, SDHB, SDHC, SDHD, SMAD4, SMARCA4, SMARCB1, SMARCE1, STK11, SUFU, TERC, TERT, TMEM127, TP53, TSC1, TSC2, VHL, WRN, WT1).  A Variant of UncertainSignificancewas detected: CASRc.106G>A (p.Gly36Arg). Myraid testing negative for somatic BRACA1/2, positive for HRD.   # HRD positive, was referred to [facility] for clinical trials of Olarparib maintenance. She opted out.  #  Treatment:  Stage IIIC Ovarian cancer:  #s/p Carboplatin and taxol x 4 neoadjuvant chemotherapy followed by debulking surgery  03/14/2017 # 03/28/2017 S/p Adjuvant carbo and taxol x3   Local recurrence, CA125 one 46.3 03/15/2018-05/17/2018 Carboplatin and Taxol x 4. CA125 decrease from 49.7 to 6 after 4 cycles of treatment.  Started on Olarparib 387m BID on 05/17/2018.   INTERVAL HISTORY 68y.o. female with above oncology history reviewed by me today presents for evaluation for chemotherapy for treatment of ovarian cancer  Patient reports feeling well at baseline.  Denies nausea vomiting or diarrhea. She was seen by Dr. BFransisca Connorsfor further evaluation due to rising Ca1 25 and MRI abdomenPelvis on 11/05/2018 showed Interval progression of, now measuring 2.7 x 2.3 cm and concerning for progression of metastatic disease.retrocaval lymph node in the abdomen Olaparib was held for short period of time. Her CA125 trended down again.  Dr. BFransisca Connorsrecommending resume olaparib and continue monitor. If she has more significant progression of disease she may be a candidate for clinical trial or will be treated with other chemotherapy drugs including Doxil, gemcitabine and Avastin. Patient reports having no new complaint after resuming olaparib.  Chronic fatigue is at baseline.   Review of Systems  Constitutional: Positive for fatigue. Negative for appetite change, chills and fever.  HENT:   Negative for hearing loss and voice change.   Eyes: Negative for eye problems.  Respiratory: Negative for chest tightness and cough.   Cardiovascular: Negative for chest pain.  Gastrointestinal: Negative for abdominal distention, abdominal pain and blood in stool.  Endocrine: Negative for hot flashes.  Genitourinary: Negative for bladder incontinence, difficulty urinating, frequency and hematuria.   Musculoskeletal: Negative for arthralgias.  Skin: Negative for itching and rash.  Neurological: Negative for extremity weakness.  Hematological: Negative for adenopathy.  Psychiatric/Behavioral: Negative for confusion.   MEDICAL HISTORY: Past  Medical History:  Diagnosis Date  . Dysrhythmia   .  Genetic testing 03/28/2017   Multi-Cancer panel (83 genes) @ [facility] - No pathogenic mutations detected  . High grade ovarian cancer ([place]) 11/20/2016  . Pelvic mass in female     SURGICAL HISTORY: Past Surgical History:  Procedure Laterality Date  . APPENDECTOMY    . LAPAROSCOPY N/A 03/14/2017   Procedure: LAPAROSCOPY OPERATIVE;  Surgeon: Mellody Drown, MD;  Location: [facility];  Service: Gynecology;  Laterality: N/A;  . LAPAROTOMY N/A 03/14/2017   Procedure: LAPAROTOMY;  Surgeon: Mellody Drown, MD;  Location: [facility];  Service: Gynecology;  Laterality: N/A;  . LYMPH NODE DISSECTION N/A 03/14/2017   Procedure: LYMPH NODE DISSECTION;  Surgeon: Mellody Drown, MD;  Location: [facility];  Service: Gynecology;  Laterality: N/A;  . OMENTECTOMY N/A 03/14/2017   Procedure: OMENTECTOMY;  Surgeon: Mellody Drown, MD;  Location: [facility];  Service: Gynecology;  Laterality: N/A;  . PORTA CATH INSERTION N/A 11/27/2016   Procedure: Glori Luis Cath Insertion;  Surgeon: Algernon Huxley, MD;  Location: [facility];  Service: Cardiovascular;  Laterality: N/A;    SOCIAL HISTORY: Social History   Socioeconomic History  . Marital status: Married    Spouse name: Not on file  . Number of children: Not on file  . Years of education: Not on file  . Highest education level: Not on file  Occupational History  . Not on file  Social Needs  . Financial resource strain: Not on file  . Food insecurity    Worry: Not on file    Inability: Not on file  . Transportation needs    Medical: Not on file    Non-medical: Not on file  Tobacco Use  . Smoking status: Never Smoker  . Smokeless tobacco: Never Used  Substance and Sexual Activity  . Alcohol use: No    Frequency: Never    Comment: use to be occ. but not any since 5 months  . Drug use: No  . Sexual activity: Yes    Birth control/protection: Post-menopausal  Lifestyle  . Physical activity     Days per week: Not on file    Minutes per session: Not on file  . Stress: Not on file  Relationships  . Social Herbalist on phone: Not on file    Gets together: Not on file    Attends religious service: Not on file    Active member of club or organization: Not on file    Attends meetings of clubs or organizations: Not on file    Relationship status: Not on file  . Intimate partner violence    Fear of current or ex partner: Not on file    Emotionally abused: Not on file    Physically abused: Not on file    Forced sexual activity: Not on file  Other Topics Concern  . Not on file  Social History Narrative  . Not on file    FAMILY HISTORY Family History  Problem Relation Age of Onset  . Throat cancer Cousin   . Throat cancer Cousin   . Leukemia Cousin     ALLERGIES:  is allergic to omeprazole.  MEDICATIONS:  Current Outpatient Medications  Medication Sig Dispense Refill  . calcium carbonate (TUMS - DOSED IN MG ELEMENTAL CALCIUM) 500 MG chewable tablet Chew 1 tablet by mouth as needed for indigestion or heartburn.    . lidocaine-prilocaine (EMLA) cream Apply 1 application topically as needed. Apply small amount to port site at least 1 hour prior to it  being accessed, cover with plastic wrap 30 g 1  . olaparib (LYNPARZA) 150 MG tablet Take 2 tablets (300 mg total) by mouth 2 (two) times daily. Swallow whole. May take with food to decrease nausea and vomiting. 120 tablet 2  . Tetrahydrozoline HCl (REDNESS RELIEVER EYE DROPS OP) Place 1 drop into both eyes as needed (for red eyes).     . vitamin B-12 (CYANOCOBALAMIN) 1000 MCG tablet Take 1 tablet (1,000 mcg total) by mouth daily. 90 tablet 1  . loperamide (IMODIUM) 2 MG capsule Take 1 capsule (2 mg total) by mouth See admin instructions. With onset of loose stool, take 30m followed by 255mevery 2 hours until 12 hours have passed without loose bowel movement. Maximum: 16 mg/day (Patient not taking: Reported on 12/17/2018) 120  capsule 1  . ondansetron (ZOFRAN) 8 MG tablet Take 1 tablet (8 mg total) by mouth 2 (two) times daily as needed for refractory nausea / vomiting. Start on day 3 after chemo. (Patient not taking: Reported on 12/17/2018) 30 tablet 1  . prochlorperazine (COMPAZINE) 10 MG tablet Take 1 tablet (10 mg total) by mouth every 6 (six) hours as needed (Nausea or vomiting). (Patient not taking: Reported on 12/17/2018) 30 tablet 1   No current facility-administered medications for this visit.     PHYSICAL EXAMINATION:  ECOG PERFORMANCE STATUS: 0 - Asymptomatic Vitals:   12/18/18 1025  BP: (!) 112/59  Pulse: 66  Resp: 16  Temp: 98.3 F (36.8 C)    Filed Weights   12/18/18 1025  Weight: 122 lb 14.4 oz (55.7 kg)     Physical Exam  Constitutional: She is oriented to person, place, and time. No distress.  HENT:  Head: Normocephalic and atraumatic.  Nose: Nose normal.  Mouth/Throat: Oropharynx is clear and moist. No oropharyngeal exudate.  Eyes: Pupils are equal, round, and reactive to light. EOM are normal. No scleral icterus.  Neck: Normal range of motion. Neck supple. No JVD present.  Cardiovascular: Normal rate and regular rhythm.  No murmur heard. Pulmonary/Chest: Effort normal and breath sounds normal. No respiratory distress. She has no rales. She exhibits no tenderness.  Abdominal: Soft. Bowel sounds are normal. She exhibits no distension. There is no abdominal tenderness.  Musculoskeletal: Normal range of motion.        General: No edema.  Lymphadenopathy:    She has no cervical adenopathy.  Neurological: She is alert and oriented to person, place, and time. No cranial nerve deficit. She exhibits normal muscle tone. Coordination normal.  Skin: Skin is warm and dry. She is not diaphoretic. No erythema.  Right anterior medi port +   Psychiatric: Affect and judgment normal.       SKIN: LABORATORY DATA: I have personally reviewed the data as listed: CBC    Component Value Date/Time    WBC 2.5 (L) 12/18/2018 1011   RBC 2.48 (L) 12/18/2018 1011   HGB 10.1 (L) 12/18/2018 1011   HCT 29.3 (L) 12/18/2018 1011   PLT 124 (L) 12/18/2018 1011   MCV 118.1 (H) 12/18/2018 1011   MCH 40.7 (H) 12/18/2018 1011   MCHC 34.5 12/18/2018 1011   RDW 13.7 12/18/2018 1011   LYMPHSABS 0.7 12/18/2018 1011   MONOABS 0.3 12/18/2018 1011   EOSABS 0.1 12/18/2018 1011   BASOSABS 0.0 12/18/2018 1011   CMP Latest Ref Rng & Units 12/18/2018 11/18/2018 10/21/2018  Glucose 70 - 99 mg/dL 102(H) 96 101(H)  BUN 8 - 23 mg/dL '12 10 12  ' Creatinine 0.44 -  1.00 mg/dL 0.85 0.77 0.73  Sodium 135 - 145 mmol/L 135 133(L) 134(L)  Potassium 3.5 - 5.1 mmol/L 3.9 3.8 4.3  Chloride 98 - 111 mmol/L 103 103 104  CO2 22 - 32 mmol/L '24 23 26  ' Calcium 8.9 - 10.3 mg/dL 9.8 9.8 9.8  Total Protein 6.5 - 8.1 g/dL 6.8 7.1 7.3  Total Bilirubin 0.3 - 1.2 mg/dL 0.7 0.8 0.8  Alkaline Phos 38 - 126 U/L 56 57 53  AST 15 - 41 U/L 14(L) 14(L) 14(L)  ALT 0 - 44 U/L '8 9 8    ' pathology 11/16/2016 Surgical Pathology  CASE: [id number]  PATIENT: Katie Hill  Surgical Pathology Report   SPECIMEN SUBMITTED:  A. Retroperitoneal adenopathy, left  DIAGNOSIS:  A. LYMPH NODE, LEFT RETROPERITONEAL; CT-GUIDED CORE BIOPSY:  - METASTATIC HIGH-GRADE SEROUS CARCINOMA.   RADIOGRAPHIC STUDIES: I have personally reviewed the radiological images as listed and agree with the findings in the report CT chest abdomen pelvis with contrast 11/13/2016 IMPRESSION: 1. Large heterogeneous central pelvic mass measures up to 12.6 cm. The lesion generates substantial mass-effect on the uterus, bladder, and pelvic sidewall anatomy. The mass appears to invade the mid sigmoid colon. Etiology of the central pelvic mass is not definitive by CT, but ovarian primary is suspected. The lesion becomes indistinguishable from the posterior uterus on some images and uterine origin is possible, but the uterus does not appear to be the epicenter of the mass and  appears to be more displaced by it. MRI of the pelvis without and with contrast may prove helpful to better delineate the relationship of the uterus to the central pelvic mass although it may not be able to definitively localize the origin. 2. Bulky retroperitoneal and left pelvic sidewall lymphadenopathy. The retroperitoneal lymphadenopathy generates substantial mass-effect on the IVC and left renal vein. The external iliac veins along each pelvic sidewall are markedly attenuated by the mass/lymphadenopathy. Patency of these vessels cannot be definitely confirmed on this exam.  Pathology 03/14/2017   DIAGNOSIS:  A. OMENTUM; OMENTECTOMY:  - NO TUMOR SEEN.  - ONE NEGATIVE LYMPH NODE (0/1).   B. RIGHT FALLOPIAN TUBE AND OVARY; SALPINGO-OOPHORECTOMY:  - SMALL FOCI OF HIGH GRADE SEROUS CARCINOMA INVOLVING THE OVARY.  - MARKED THERAPY RELATED CHANGE.  - NO TUMOR SEEN IN THE FALLOPIAN TUBE.   C. UTERUS, CERVIX, LEFT FALLOPIAN TUBE AND OVARY; HYSTERECTOMY AND LEFT  SALPINGO-OOPHORECTOMY:  - NABOTHIAN CYSTS.  - CYSTIC ATROPHY OF THE ENDOMETRIUM.  - SEROSAL ADHESIONS.  - UNREMARKABLE FALLOPIAN TUBE.  - OVARY SHOWING TREATMENT RELATED CHANGE.   D. 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    RADIOGRAPHIC STUDIES: I have personally reviewed the radiological images as listed and agreed with the findings in the report.  02/28/2018 CT chest abdomen pelvis showed interval enlargement off and aorto caval lymph node which currently measures 1.4 cm in short axis.  This lymph node is in close proximity to the previously resected retroperitoneal lymphadenopathy and it was a concern for additional nodal metastasis.Small lung nodules stable.  08/28/2018 CT chest abdomen pevlis.  . Marked interval decrease in the abdominal aortocaval lymph node of concern on the prior study, measuring 5 mm short axis today compared to 14 mm on  the prior study. 2. No new or progressive findings on today's exam to suggest new recurrent or metastatic disease. 3. Stable 6 mm left lower lobe pulmonary nodule.  ASSESSMENT/PLAN Cancer Staging Malignant neoplasm of ovary ([place])  Staging form: Ovary, Fallopian Tube, and Primary Peritoneal Carcinoma, AJCC 8th Edition - Clinical stage from 11/22/2016: Stage IIIC (cT3c, cN1b, cM0) - Signed by Earlie Server, MD on 11/22/2016  1. Malignant neoplasm of ovary, unspecified laterality ([place])   2. Anemia associated with chemotherapy   3. Thrombocytopenia ([place])   4. Drug-induced neutropenia ([facility])   5. Port-A-Cath in place   6. Encounter for antineoplastic chemotherapy   7. Vitamin B12 deficiency   : # Ovarian Cancer, local recurrence.  Currently on olaparib 300 mg twice daily.  Tolerating well. CA125 peaked at 111 on 09/23/2018, then trended down to 90.5 on 10/21/2018,  Further trended down to 69 on 11/18/2018. MRI did showed progression of retrocaval lymph node in the abdomen.  Measuring 2.7 x 2.3 cm. Discussed with GYN oncology Dr. Jarold Song, will continue olaparib 300 mg twice daily for now. If she develops additional significant progression, she will be candidate off clinical trial be switched to other chemotherapy including Doxil, gemcitabine, Avastin .  #Chronic anemia, hemoglobin 10.1, macrocytic secondary to prolonged period.  Continue to monitor. #Thrombocytopenia, 104,000, continue to monitor. #Chemotherapy-induced neutropenia, ANC 1.5, stable.  Continue to monitor. #Vitamin B12 deficiency, continue B12 1000 MCG daily.  [name]#Continue Mediport continue port flush every 6 to 8 weeks  RTF 4 weeks.    Earlie Server, MD, PhD Hematology Oncology [facility] Pager- [phone] 12/18/18

## 2018-12-19 LAB — CA 125: Cancer Antigen (CA) 125: 54.8 U/mL — ABNORMAL HIGH (ref 0.0–38.1)

## 2019-01-14 ENCOUNTER — Other Ambulatory Visit: Payer: Self-pay

## 2019-01-14 ENCOUNTER — Encounter: Payer: Self-pay | Admitting: Oncology

## 2019-01-14 NOTE — Progress Notes (Signed)
Patient stated that she had been doing well with no complaints. 

## 2019-01-15 ENCOUNTER — Inpatient Hospital Stay (HOSPITAL_BASED_OUTPATIENT_CLINIC_OR_DEPARTMENT_OTHER): Payer: Medicare Other | Admitting: Oncology

## 2019-01-15 ENCOUNTER — Other Ambulatory Visit: Payer: Self-pay

## 2019-01-15 ENCOUNTER — Inpatient Hospital Stay: Payer: Medicare Other | Attending: Oncology

## 2019-01-15 VITALS — BP 98/60 | HR 67 | Temp 98.0°F | Resp 18 | Wt 121.3 lb

## 2019-01-15 DIAGNOSIS — C569 Malignant neoplasm of unspecified ovary: Secondary | ICD-10-CM

## 2019-01-15 DIAGNOSIS — Z452 Encounter for adjustment and management of vascular access device: Secondary | ICD-10-CM | POA: Insufficient documentation

## 2019-01-15 DIAGNOSIS — D696 Thrombocytopenia, unspecified: Secondary | ICD-10-CM | POA: Diagnosis not present

## 2019-01-15 DIAGNOSIS — Z95828 Presence of other vascular implants and grafts: Secondary | ICD-10-CM

## 2019-01-15 DIAGNOSIS — T451X5A Adverse effect of antineoplastic and immunosuppressive drugs, initial encounter: Secondary | ICD-10-CM

## 2019-01-15 DIAGNOSIS — D6481 Anemia due to antineoplastic chemotherapy: Secondary | ICD-10-CM | POA: Diagnosis not present

## 2019-01-15 DIAGNOSIS — D702 Other drug-induced agranulocytosis: Secondary | ICD-10-CM

## 2019-01-15 LAB — CBC WITH DIFFERENTIAL/PLATELET
Abs Immature Granulocytes: 0.01 10*3/uL (ref 0.00–0.07)
Basophils Absolute: 0 10*3/uL (ref 0.0–0.1)
Basophils Relative: 1 %
Eosinophils Absolute: 0.1 10*3/uL (ref 0.0–0.5)
Eosinophils Relative: 2 %
HCT: 29.5 % — ABNORMAL LOW (ref 36.0–46.0)
Hemoglobin: 10.5 g/dL — ABNORMAL LOW (ref 12.0–15.0)
Immature Granulocytes: 0 %
Lymphocytes Relative: 35 %
Lymphs Abs: 1.2 10*3/uL (ref 0.7–4.0)
MCH: 40.9 pg — ABNORMAL HIGH (ref 26.0–34.0)
MCHC: 35.6 g/dL (ref 30.0–36.0)
MCV: 114.8 fL — ABNORMAL HIGH (ref 80.0–100.0)
Monocytes Absolute: 0.3 10*3/uL (ref 0.1–1.0)
Monocytes Relative: 9 %
Neutro Abs: 1.8 10*3/uL (ref 1.7–7.7)
Neutrophils Relative %: 53 %
Platelets: 143 10*3/uL — ABNORMAL LOW (ref 150–400)
RBC: 2.57 MIL/uL — ABNORMAL LOW (ref 3.87–5.11)
RDW: 14 % (ref 11.5–15.5)
WBC: 3.4 10*3/uL — ABNORMAL LOW (ref 4.0–10.5)
nRBC: 0 % (ref 0.0–0.2)

## 2019-01-15 LAB — COMPREHENSIVE METABOLIC PANEL
ALT: 8 U/L (ref 0–44)
AST: 14 U/L — ABNORMAL LOW (ref 15–41)
Albumin: 4.7 g/dL (ref 3.5–5.0)
Alkaline Phosphatase: 59 U/L (ref 38–126)
Anion gap: 6 (ref 5–15)
BUN: 11 mg/dL (ref 8–23)
CO2: 24 mmol/L (ref 22–32)
Calcium: 9.9 mg/dL (ref 8.9–10.3)
Chloride: 104 mmol/L (ref 98–111)
Creatinine, Ser: 0.72 mg/dL (ref 0.44–1.00)
GFR calc Af Amer: >60 mL/min (ref >60)
GFR calc non Af Amer: >60 mL/min (ref >60)
Glucose, Bld: 96 mg/dL (ref 70–99)
Potassium: 3.8 mmol/L (ref 3.5–5.1)
Sodium: 134 mmol/L — ABNORMAL LOW (ref 135–145)
Total Bilirubin: 0.8 mg/dL (ref 0.3–1.2)
Total Protein: 7.4 g/dL (ref 6.5–8.1)

## 2019-01-15 MED ORDER — HEPARIN SOD (PORK) LOCK FLUSH 100 UNIT/ML IV SOLN
500.0000 [IU] | Freq: Once | INTRAVENOUS | Status: AC
  Administered 2019-01-15: 500 [IU] via INTRAVENOUS
  Filled 2019-01-15: qty 5

## 2019-01-15 MED ORDER — OLAPARIB 150 MG PO TABS: 300.0000 mg | ORAL_TABLET | Freq: Two times a day (BID) | ORAL | 0 refills | Status: DC

## 2019-01-15 MED ORDER — VITAMIN B-12 1000 MCG PO TABS: 1000.0000 ug | ORAL_TABLET | Freq: Every day | ORAL | 4 refills | Status: DC

## 2019-01-15 MED ORDER — SODIUM CHLORIDE 0.9% FLUSH
10.0000 mL | Freq: Once | INTRAVENOUS | Status: AC
  Administered 2019-01-15: 10 mL via INTRAVENOUS
  Filled 2019-01-15: qty 10

## 2019-01-15 NOTE — Progress Notes (Signed)
[facility] Follow up visit  Patient Care Team: Patient, [name], [name], [name], [name], MD as Referring Physician (Obstetrics and Gynecology)  CHIEF COMPLAINTS/PURPOSE OF Visit Follow up for chemotherapy tolerability of  ovarian cancer.  HISTORY OF PRESENTING ILLNESS: Katie Hill 68 y.o. female presents for follow up of management of stage IIIC ovarian cancer. She underwent  Neoadjuvant chemotherapy of carbo and taxol x 4  # Patient had debulking surgery on 03/14/2017. She had a laparoscopy with conversion to laparotomy, total hysterectomy, with bilateral salpingo oophorectomy, right aortic lymph node dissection, omentectomy. Pathology showed small foci of residual disease in ovary.   Genetic testing negative for83 genes on [facility]'s Multi-Cancer panel (ALK, APC, ATM, AXIN2, BAP1, BARD1, BLM, BMPR1A, BRCA1, BRCA2, BRIP1, CASR, CDC73, CDH1, CDK4, CDKN1B, CDKN1C, CDKN2A, CEBPA, CHEK2, CTNNA1, DICER1, DIS3L2, EGFR, EPCAM, FH, FLCN, GATA2, GPC3, GREM1, HOXB13, HRAS, KIT, MAX, MEN1, MET, MITF, MLH1, MSH2, MSH3, MSH6, MUTYH, NBN, NF1, NF2, NTHL1, PALB2, PDGFRA, PHOX2B, PMS2, POLD1, POLE, POT1, PRKAR1A, PTCH1, PTEN, RAD50, RAD51C, RAD51D, RB1, RECQL4, RET, RUNX1, SDHA, SDHAF2, SDHB, SDHC, SDHD, SMAD4, SMARCA4, SMARCB1, SMARCE1, STK11, SUFU, TERC, TERT, TMEM127, TP53, TSC1, TSC2, VHL, WRN, WT1).  A Variant of UncertainSignificancewas detected: CASRc.106G>A (p.Gly36Arg). Myraid testing negative for somatic BRACA1/2, positive for HRD.   # HRD positive, was referred to [facility] for clinical trials of Olarparib maintenance. She opted out.  #  Treatment:  Stage IIIC Ovarian cancer:  #s/p Carboplatin and taxol x 4 neoadjuvant chemotherapy followed by debulking surgery  03/14/2017 # 03/28/2017 S/p Adjuvant carbo and taxol x3   Local recurrence, CA125 one 46.3 03/15/2018-05/17/2018 Carboplatin and Taxol x 4. CA125 decrease from 49.7 to 6 after 4 cycles of treatment.  Started on Olarparib 368m BID on 05/17/2018.  # was seen by Dr. BFransisca Connorsfor further evaluation due to rising Ca1 25 and MRI abdomenPelvis on 11/05/2018 showed Interval progression of, now measuring 2.7 x 2.3 cm and concerning for progression of metastatic disease.retrocaval lymph node in the abdomen Olaparib was held for short period of time. Her CA125 trended down again.  Dr. BFransisca Connorsrecommending resume olaparib and continue monitor. If she has more significant progression of disease she may be a candidate for clinical trial or will be treated with other chemotherapy drugs including Doxil, gemcitabine and Avastin.   INTERVAL HISTORY 68y.o. female with above oncology history reviewed by me today presents for evaluation for chemotherapy for treatment of ovarian cancer  Reports feeling well at baseline. Denies any nausea or vomiting or abdominal pain.  Chronic fatigue is at baseline.   Review of Systems  Constitutional: Positive for fatigue. Negative for appetite change, chills and fever.  HENT:   Negative for hearing loss and voice change.   Eyes: Negative for eye problems.  Respiratory: Negative for chest tightness and cough.   Cardiovascular: Negative for chest pain.  Gastrointestinal: Negative for abdominal distention, abdominal pain and blood in stool.  Endocrine: Negative for hot flashes.  Genitourinary: Negative for bladder incontinence, difficulty urinating, frequency and hematuria.   Musculoskeletal: Negative for arthralgias.  Skin: Negative for itching and rash.  Neurological: Negative for extremity weakness.  Hematological: Negative for adenopathy.  Psychiatric/Behavioral: Negative for confusion.   MEDICAL HISTORY: Past Medical History:  Diagnosis Date  . Dysrhythmia   .  Genetic testing 03/28/2017  Multi-Cancer panel (83 genes) @ [facility] - No pathogenic mutations detected  . High grade ovarian cancer ([place]) 11/20/2016  . Pelvic mass in female     SURGICAL HISTORY: Past Surgical History:  Procedure Laterality Date  . APPENDECTOMY    . LAPAROSCOPY N/A 03/14/2017   Procedure: LAPAROSCOPY OPERATIVE;  Surgeon: Mellody Drown, MD;  Location: [facility];  Service: Gynecology;  Laterality: N/A;  . LAPAROTOMY N/A 03/14/2017   Procedure: LAPAROTOMY;  Surgeon: Mellody Drown, MD;  Location: [facility];  Service: Gynecology;  Laterality: N/A;  . LYMPH NODE DISSECTION N/A 03/14/2017   Procedure: LYMPH NODE DISSECTION;  Surgeon: Mellody Drown, MD;  Location: [facility];  Service: Gynecology;  Laterality: N/A;  . OMENTECTOMY N/A 03/14/2017   Procedure: OMENTECTOMY;  Surgeon: Mellody Drown, MD;  Location: [facility];  Service: Gynecology;  Laterality: N/A;  . PORTA CATH INSERTION N/A 11/27/2016   Procedure: Glori Luis Cath Insertion;  Surgeon: Algernon Huxley, MD;  Location: [facility];  Service: Cardiovascular;  Laterality: N/A;    SOCIAL HISTORY: Social History   Socioeconomic History  . Marital status: Married    Spouse name: Not on file  . Number of children: Not on file  . Years of education: Not on file  . Highest education level: Not on file  Occupational History  . Not on file  Social Needs  . Financial resource strain: Not on file  . Food insecurity    Worry: Not on file    Inability: Not on file  . Transportation needs    Medical: Not on file    Non-medical: Not on file  Tobacco Use  . Smoking status: Never Smoker  . Smokeless tobacco: Never Used  Substance and Sexual Activity  . Alcohol use: No    Frequency: Never    Comment: use to be occ. but not any since 5 months  . Drug use: No  . Sexual activity: Yes    Birth control/protection: Post-menopausal  Lifestyle  . Physical activity    Days per week: Not on file    Minutes per session:  Not on file  . Stress: Not on file  Relationships  . Social Herbalist on phone: Not on file    Gets together: Not on file    Attends religious service: Not on file    Active member of club or organization: Not on file    Attends meetings of clubs or organizations: Not on file    Relationship status: Not on file  . Intimate partner violence    Fear of current or ex partner: Not on file    Emotionally abused: Not on file    Physically abused: Not on file    Forced sexual activity: Not on file  Other Topics Concern  . Not on file  Social History Narrative  . Not on file    FAMILY HISTORY Family History  Problem Relation Age of Onset  . Throat cancer Cousin   . Throat cancer Cousin   . Leukemia Cousin     ALLERGIES:  is allergic to omeprazole.  MEDICATIONS:  Current Outpatient Medications  Medication Sig Dispense Refill  . calcium carbonate (TUMS - DOSED IN MG ELEMENTAL CALCIUM) 500 MG chewable tablet Chew 1 tablet by mouth as needed for indigestion or heartburn.    . lidocaine-prilocaine (EMLA) cream Apply 1 application topically as needed. Apply small amount to port site at least 1 hour prior to it being accessed, cover with plastic  wrap 30 g 1  . loperamide (IMODIUM) 2 MG capsule Take 1 capsule (2 mg total) by mouth See admin instructions. With onset of loose stool, take 3m followed by 218mevery 2 hours until 12 hours have passed without loose bowel movement. Maximum: 16 mg/day 120 capsule 1  . ondansetron (ZOFRAN) 8 MG tablet Take 1 tablet (8 mg total) by mouth 2 (two) times daily as needed for refractory nausea / vomiting. Start on day 3 after chemo. 30 tablet 1  . prochlorperazine (COMPAZINE) 10 MG tablet Take 1 tablet (10 mg total) by mouth every 6 (six) hours as needed (Nausea or vomiting). 30 tablet 1  . Tetrahydrozoline HCl (REDNESS RELIEVER EYE DROPS OP) Place 1 drop into both eyes as needed (for red eyes).     . Marland Kitchenlaparib (LYNPARZA) 150 MG tablet Take 2  tablets (300 mg total) by mouth 2 (two) times daily. Swallow whole. May take with food to decrease nausea and vomiting. 360 tablet 0  . vitamin B-12 (CYANOCOBALAMIN) 1000 MCG tablet Take 1 tablet (1,000 mcg total) by mouth daily. 90 tablet 4   No current facility-administered medications for this visit.     PHYSICAL EXAMINATION:  ECOG PERFORMANCE STATUS: 0 - Asymptomatic Vitals:   01/14/19 1548  BP: 98/60  Pulse: 67  Resp: 18  Temp: 98 F (36.7 C)    Filed Weights   01/14/19 1548  Weight: 121 lb 4.8 oz (55 kg)     Physical Exam  Constitutional: She is oriented to person, place, and time. No distress.  HENT:  Head: Normocephalic and atraumatic.  Nose: Nose normal.  Mouth/Throat: Oropharynx is clear and moist. No oropharyngeal exudate.  Eyes: Pupils are equal, round, and reactive to light. EOM are normal. No scleral icterus.  Neck: Normal range of motion. Neck supple. No JVD present.  Cardiovascular: Normal rate and regular rhythm.  No murmur heard. Pulmonary/Chest: Effort normal and breath sounds normal. No respiratory distress. She has no rales. She exhibits no tenderness.  Abdominal: Soft. Bowel sounds are normal. She exhibits no distension. There is no abdominal tenderness.  Musculoskeletal: Normal range of motion.        General: No edema.  Lymphadenopathy:    She has no cervical adenopathy.  Neurological: She is alert and oriented to person, place, and time. No cranial nerve deficit. She exhibits normal muscle tone. Coordination normal.  Skin: Skin is warm and dry. She is not diaphoretic. No erythema.  Right anterior medi port +   Psychiatric: Affect and judgment normal.       SKIN: LABORATORY DATA: I have personally reviewed the data as listed: CBC    Component Value Date/Time   WBC 3.4 (L) 01/15/2019 1339   RBC 2.57 (L) 01/15/2019 1339   HGB 10.5 (L) 01/15/2019 1339   HCT 29.5 (L) 01/15/2019 1339   PLT 143 (L) 01/15/2019 1339   MCV 114.8 (H) 01/15/2019  1339   MCH 40.9 (H) 01/15/2019 1339   MCHC 35.6 01/15/2019 1339   RDW 14.0 01/15/2019 1339   LYMPHSABS 1.2 01/15/2019 1339   MONOABS 0.3 01/15/2019 1339   EOSABS 0.1 01/15/2019 1339   BASOSABS 0.0 01/15/2019 1339   CMP Latest Ref Rng & Units 01/15/2019 12/18/2018 11/18/2018  Glucose 70 - 99 mg/dL 96 102(H) 96  BUN 8 - 23 mg/dL _0 Creatinine 0.44 - 1.00 mg/dL 0.72 0.85 0.77  Sodium 135 - 145 mmol/L 134(L) 135 133(L)  Potassium 3.5 - 5.1 mmol/L 3.8 3.9 3.8  Chloride 98 - 111 mmol/L 104 103 103  CO2 22 - 32 mmol/L _0 Calcium 8.9 - 10.3 mg/dL 9.9 9.8 9.8  Total Protein 6.5 - 8.1 g/dL 7.4 6.8 7.1  Total Bilirubin 0.3 - 1.2 mg/dL 0.8 0.7 0.8  Alkaline Phos 38 - 126 U/L 59 56 57  AST 15 - 41 U/L 14(L) 14(L) 14(L)  ALT 0 - 44 U/L _1 pathology 11/16/2016 Surgical Pathology  CASE: [id number]  PATIENT: Tahira Bergin  Surgical Pathology Report   SPECIMEN SUBMITTED:  A. Retroperitoneal adenopathy, left  DIAGNOSIS:  A. LYMPH NODE, LEFT RETROPERITONEAL; CT-GUIDED CORE BIOPSY:  - METASTATIC HIGH-GRADE SEROUS CARCINOMA.   RADIOGRAPHIC STUDIES: I have personally reviewed the radiological images as listed and agree with the findings in the report CT chest abdomen pelvis with contrast 11/13/2016 IMPRESSION: 1. Large heterogeneous central pelvic mass measures up to 12.6 cm. The lesion generates substantial mass-effect on the uterus, bladder, and pelvic sidewall anatomy. The mass appears to invade the mid sigmoid colon. Etiology of the central pelvic mass is not definitive by CT, but ovarian primary is suspected. The lesion becomes indistinguishable from the posterior uterus on some images and uterine origin is possible, but the uterus does not appear to be the epicenter of the mass and appears to be more displaced by it. MRI of the pelvis without and with contrast may prove helpful to better delineate the relationship of the uterus to the central pelvic mass although it may  not be able to definitively localize the origin. 2. Bulky retroperitoneal and left pelvic sidewall lymphadenopathy. The retroperitoneal lymphadenopathy generates substantial mass-effect on the IVC and left renal vein. The external iliac veins along each pelvic sidewall are markedly attenuated by the mass/lymphadenopathy. Patency of these vessels cannot be definitely confirmed on this exam.  Pathology 03/14/2017   DIAGNOSIS:  A. OMENTUM; OMENTECTOMY:  - NO TUMOR SEEN.  - ONE NEGATIVE LYMPH NODE (0/1).   B. RIGHT FALLOPIAN TUBE AND OVARY; SALPINGO-OOPHORECTOMY:  - SMALL FOCI OF HIGH GRADE SEROUS CARCINOMA INVOLVING THE OVARY.  - MARKED THERAPY RELATED CHANGE.  - NO TUMOR SEEN IN THE FALLOPIAN TUBE.   C. UTERUS, CERVIX, LEFT FALLOPIAN TUBE AND OVARY; HYSTERECTOMY AND LEFT  SALPINGO-OOPHORECTOMY:  - NABOTHIAN CYSTS.  - CYSTIC ATROPHY OF THE ENDOMETRIUM.  - SEROSAL ADHESIONS.  - UNREMARKABLE FALLOPIAN TUBE.  - OVARY SHOWING TREATMENT RELATED CHANGE.   D. 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    RADIOGRAPHIC STUDIES: I have personally reviewed the radiological images as listed and agreed with the findings in the report.  02/28/2018 CT chest abdomen pelvis showed interval enlargement off and aorto caval lymph node which currently measures 1.4 cm in short axis.  This lymph node is in close proximity to the previously resected retroperitoneal lymphadenopathy and it was a concern for additional nodal metastasis.Small lung nodules stable.  08/28/2018 CT chest abdomen pevlis.  . Marked interval decrease in the abdominal aortocaval lymph node of concern on the prior study, measuring 5 mm short axis today compared to 14 mm on the prior study. 2. No new or progressive findings on today's exam to suggest new recurrent or metastatic disease. 3. Stable 6 mm left lower lobe pulmonary nodule.  ASSESSMENT/PLAN Cancer  Staging Malignant neoplasm of ovary [facility]) Staging form: Ovary, Fallopian Tube, and Primary Peritoneal Carcinoma, AJCC 8th Edition - Clinical stage from 11/22/2016: Stage IIIC (cT3c, cN1b, cM0) - Signed  by Earlie Server, MD on 11/22/2016  1. Malignant neoplasm of ovary, unspecified laterality ([place])   2. Anemia associated with chemotherapy   3. Thrombocytopenia ([place])   4. Drug-induced neutropenia ([facility])   5. Port-A-Cath in place   : # Ovarian Cancer, local recurrence.  Currently on olaparib 300 mg twice daily.  Tolerates well.  Labs are reviewed and discussed with patient.  I did not check CA125 today.  CA125 peaked at 111 on 09/23/2018, then trended down to 90.5 on 10/21/2018,  Further trended down to 69 on 11/18/2018 Will repeat image after next visit.   #Chronic anemia, hemoglobin 10.5, macrocytic secondary to Olaparib continue to monitor.  #Thrombocytopenia, platelet trended down at last visit, now improved. Monitor.  #Chemotherapy-induced neutropenia, ANC 1.8 today. Improved.  #Vitamin B12 deficiency, continue B12 1000 MCG daily.  [name]#Mediport continue port flush every 6 to 8 weeks  RTF 4 weeks.    Earlie Server, MD, PhD Hematology Oncology [facility] at [facility] Pager- [phone] 01/15/19

## 2019-01-22 ENCOUNTER — Other Ambulatory Visit: Payer: Self-pay | Admitting: Oncology

## 2019-01-22 DIAGNOSIS — C569 Malignant neoplasm of unspecified ovary: Secondary | ICD-10-CM

## 2019-01-23 NOTE — Telephone Encounter (Signed)
I refilled it 1 week ago Is there a problem?

## 2019-01-23 NOTE — Telephone Encounter (Signed)
I called [facility] and spoke to [place], which states that they have not received refill request since September. Must have not gone through when it was sent last week. Will you please refill. Thanks.

## 2019-02-12 ENCOUNTER — Other Ambulatory Visit: Payer: Self-pay

## 2019-02-12 ENCOUNTER — Inpatient Hospital Stay: Payer: Medicare Other | Attending: Oncology

## 2019-02-12 ENCOUNTER — Encounter: Payer: Self-pay | Admitting: Oncology

## 2019-02-12 ENCOUNTER — Inpatient Hospital Stay (HOSPITAL_BASED_OUTPATIENT_CLINIC_OR_DEPARTMENT_OTHER): Payer: Medicare Other | Admitting: Oncology

## 2019-02-12 VITALS — BP 112/71 | HR 69 | Temp 97.7°F | Resp 16 | Wt 122.1 lb

## 2019-02-12 DIAGNOSIS — Z95828 Presence of other vascular implants and grafts: Secondary | ICD-10-CM

## 2019-02-12 DIAGNOSIS — E871 Hypo-osmolality and hyponatremia: Secondary | ICD-10-CM | POA: Insufficient documentation

## 2019-02-12 DIAGNOSIS — Z5111 Encounter for antineoplastic chemotherapy: Secondary | ICD-10-CM

## 2019-02-12 DIAGNOSIS — E538 Deficiency of other specified B group vitamins: Secondary | ICD-10-CM

## 2019-02-12 DIAGNOSIS — Z90710 Acquired absence of both cervix and uterus: Secondary | ICD-10-CM | POA: Diagnosis not present

## 2019-02-12 DIAGNOSIS — D702 Other drug-induced agranulocytosis: Secondary | ICD-10-CM

## 2019-02-12 DIAGNOSIS — C569 Malignant neoplasm of unspecified ovary: Secondary | ICD-10-CM

## 2019-02-12 DIAGNOSIS — Z90722 Acquired absence of ovaries, bilateral: Secondary | ICD-10-CM | POA: Diagnosis not present

## 2019-02-12 DIAGNOSIS — Z9221 Personal history of antineoplastic chemotherapy: Secondary | ICD-10-CM | POA: Diagnosis not present

## 2019-02-12 DIAGNOSIS — I499 Cardiac arrhythmia, unspecified: Secondary | ICD-10-CM | POA: Insufficient documentation

## 2019-02-12 DIAGNOSIS — D6481 Anemia due to antineoplastic chemotherapy: Secondary | ICD-10-CM | POA: Diagnosis not present

## 2019-02-12 DIAGNOSIS — T451X5A Adverse effect of antineoplastic and immunosuppressive drugs, initial encounter: Secondary | ICD-10-CM | POA: Diagnosis not present

## 2019-02-12 DIAGNOSIS — Z79899 Other long term (current) drug therapy: Secondary | ICD-10-CM | POA: Diagnosis not present

## 2019-02-12 DIAGNOSIS — D6959 Other secondary thrombocytopenia: Secondary | ICD-10-CM | POA: Diagnosis not present

## 2019-02-12 DIAGNOSIS — D696 Thrombocytopenia, unspecified: Secondary | ICD-10-CM

## 2019-02-12 LAB — COMPREHENSIVE METABOLIC PANEL
ALT: 8 U/L (ref 0–44)
AST: 15 U/L (ref 15–41)
Albumin: 4.6 g/dL (ref 3.5–5.0)
Alkaline Phosphatase: 57 U/L (ref 38–126)
Anion gap: 7 (ref 5–15)
BUN: 12 mg/dL (ref 8–23)
CO2: 24 mmol/L (ref 22–32)
Calcium: 9.9 mg/dL (ref 8.9–10.3)
Chloride: 101 mmol/L (ref 98–111)
Creatinine, Ser: 0.79 mg/dL (ref 0.44–1.00)
GFR calc Af Amer: >60 mL/min (ref >60)
GFR calc non Af Amer: >60 mL/min (ref >60)
Glucose, Bld: 98 mg/dL (ref 70–99)
Potassium: 4.1 mmol/L (ref 3.5–5.1)
Sodium: 132 mmol/L — ABNORMAL LOW (ref 135–145)
Total Bilirubin: 0.7 mg/dL (ref 0.3–1.2)
Total Protein: 7.2 g/dL (ref 6.5–8.1)

## 2019-02-12 LAB — CBC WITH DIFFERENTIAL/PLATELET
Abs Immature Granulocytes: 0.01 10*3/uL (ref 0.00–0.07)
Basophils Absolute: 0 10*3/uL (ref 0.0–0.1)
Basophils Relative: 1 %
Eosinophils Absolute: 0.1 10*3/uL (ref 0.0–0.5)
Eosinophils Relative: 2 %
HCT: 30.8 % — ABNORMAL LOW (ref 36.0–46.0)
Hemoglobin: 10.7 g/dL — ABNORMAL LOW (ref 12.0–15.0)
Immature Granulocytes: 0 %
Lymphocytes Relative: 26 %
Lymphs Abs: 0.8 10*3/uL (ref 0.7–4.0)
MCH: 40.7 pg — ABNORMAL HIGH (ref 26.0–34.0)
MCHC: 34.7 g/dL (ref 30.0–36.0)
MCV: 117.1 fL — ABNORMAL HIGH (ref 80.0–100.0)
Monocytes Absolute: 0.3 10*3/uL (ref 0.1–1.0)
Monocytes Relative: 9 %
Neutro Abs: 2 10*3/uL (ref 1.7–7.7)
Neutrophils Relative %: 62 %
Platelets: 133 10*3/uL — ABNORMAL LOW (ref 150–400)
RBC: 2.63 MIL/uL — ABNORMAL LOW (ref 3.87–5.11)
RDW: 14.3 % (ref 11.5–15.5)
WBC: 3.1 10*3/uL — ABNORMAL LOW (ref 4.0–10.5)
nRBC: 0 % (ref 0.0–0.2)

## 2019-02-12 NOTE — Progress Notes (Signed)
Patient does not offer any problems today.  

## 2019-02-12 NOTE — Progress Notes (Signed)
[facility] Follow up visit  Patient Care Team: Patient, [name], [name], [name], [name], MD as Referring Physician (Obstetrics and Gynecology)  CHIEF COMPLAINTS/PURPOSE OF Visit Follow up for chemotherapy tolerability of  ovarian cancer.  HISTORY OF PRESENTING ILLNESS: Katie Hill 68 y.o. female presents for follow up of management of stage IIIC ovarian cancer. She underwent  Neoadjuvant chemotherapy of carbo and taxol x 4  # Patient had debulking surgery on 03/14/2017. She had a laparoscopy with conversion to laparotomy, total hysterectomy, with bilateral salpingo oophorectomy, right aortic lymph node dissection, omentectomy. Pathology showed small foci of residual disease in ovary.   Genetic testing negative for83 genes on [facility]'s Multi-Cancer panel (ALK, APC, ATM, AXIN2, BAP1, BARD1, BLM, BMPR1A, BRCA1, BRCA2, BRIP1, CASR, CDC73, CDH1, CDK4, CDKN1B, CDKN1C, CDKN2A, CEBPA, CHEK2, CTNNA1, DICER1, DIS3L2, EGFR, EPCAM, FH, FLCN, GATA2, GPC3, GREM1, HOXB13, HRAS, KIT, MAX, MEN1, MET, MITF, MLH1, MSH2, MSH3, MSH6, MUTYH, NBN, NF1, NF2, NTHL1, PALB2, PDGFRA, PHOX2B, PMS2, POLD1, POLE, POT1, PRKAR1A, PTCH1, PTEN, RAD50, RAD51C, RAD51D, RB1, RECQL4, RET, RUNX1, SDHA, SDHAF2, SDHB, SDHC, SDHD, SMAD4, SMARCA4, SMARCB1, SMARCE1, STK11, SUFU, TERC, TERT, TMEM127, TP53, TSC1, TSC2, VHL, WRN, WT1).  A Variant of UncertainSignificancewas detected: CASRc.106G>A (p.Gly36Arg). Myraid testing negative for somatic BRACA1/2, positive for HRD.   # HRD positive, was referred to [facility] for clinical trials of Olarparib maintenance. She opted out.  #  Treatment:  Stage IIIC Ovarian cancer:  #s/p Carboplatin and taxol x 4 neoadjuvant chemotherapy followed by debulking surgery  03/14/2017 # 03/28/2017 S/p Adjuvant carbo and taxol x3   Local recurrence, CA125 one 46.3 03/15/2018-05/17/2018 Carboplatin and Taxol x 4. CA125 decrease from 49.7 to 6 after 4 cycles of treatment.  Started on Olarparib 373m BID on 05/17/2018.  # was seen by Dr. BFransisca Connorsfor further evaluation due to rising Ca1 25 and MRI abdomenPelvis on 11/05/2018 showed Interval progression of, now measuring 2.7 x 2.3 cm and concerning for progression of metastatic disease.retrocaval lymph node in the abdomen Olaparib was held for short period of time. Her CA125 trended down again.  Dr. BFransisca Connorsrecommending resume olaparib and continue monitor. If she has more significant progression of disease she may be a candidate for clinical trial or will be treated with other chemotherapy drugs including Doxil, gemcitabine and Avastin.   INTERVAL HISTORY 68y.o. female with above oncology history reviewed by me today presents for evaluation for chemotherapy for treatment of ovarian cancer  Reports that she feels well at baseline. No new complaints.  Chronic fatigue at baseline.  Denies any nausea, vomiting or abdominal pain.     Review of Systems  Constitutional: Positive for fatigue. Negative for appetite change, chills and fever.  HENT:   Negative for hearing loss and voice change.   Eyes: Negative for eye problems.  Respiratory: Negative for chest tightness and cough.   Cardiovascular: Negative for chest pain.  Gastrointestinal: Negative for abdominal distention, abdominal pain and blood in stool.  Endocrine: Negative for hot flashes.  Genitourinary: Negative for bladder incontinence, difficulty urinating, frequency and hematuria.   Musculoskeletal: Negative for arthralgias.  Skin: Negative for itching and rash.  Neurological: Negative for extremity weakness.  Hematological: Negative for adenopathy.  Psychiatric/Behavioral: Negative for confusion.   MEDICAL HISTORY: Past Medical History:  Diagnosis Date   . Dysrhythmia   .  Genetic testing 03/28/2017   Multi-Cancer panel (83 genes) @ [facility] - No pathogenic mutations detected  . High grade ovarian cancer ([place]) 11/20/2016  . Pelvic mass in female     SURGICAL HISTORY: Past Surgical History:  Procedure Laterality Date  . APPENDECTOMY    . LAPAROSCOPY N/A 03/14/2017   Procedure: LAPAROSCOPY OPERATIVE;  Surgeon: Mellody Drown, MD;  Location: [facility];  Service: Gynecology;  Laterality: N/A;  . LAPAROTOMY N/A 03/14/2017   Procedure: LAPAROTOMY;  Surgeon: Mellody Drown, MD;  Location: [facility];  Service: Gynecology;  Laterality: N/A;  . LYMPH NODE DISSECTION N/A 03/14/2017   Procedure: LYMPH NODE DISSECTION;  Surgeon: Mellody Drown, MD;  Location: [facility];  Service: Gynecology;  Laterality: N/A;  . OMENTECTOMY N/A 03/14/2017   Procedure: OMENTECTOMY;  Surgeon: Mellody Drown, MD;  Location: [facility];  Service: Gynecology;  Laterality: N/A;  . PORTA CATH INSERTION N/A 11/27/2016   Procedure: Glori Luis Cath Insertion;  Surgeon: Algernon Huxley, MD;  Location: [facility];  Service: Cardiovascular;  Laterality: N/A;    SOCIAL HISTORY: Social History   Socioeconomic History  . Marital status: Married    Spouse name: Not on file  . Number of children: Not on file  . Years of education: Not on file  . Highest education level: Not on file  Occupational History  . Not on file  Social Needs  . Financial resource strain: Not on file  . Food insecurity    Worry: Not on file    Inability: Not on file  . Transportation needs    Medical: Not on file    Non-medical: Not on file  Tobacco Use  . Smoking status: Never Smoker  . Smokeless tobacco: Never Used  Substance and Sexual Activity  . Alcohol use: No    Frequency: Never    Comment: use to be occ. but not any since 5 months  . Drug use: No  . Sexual activity: Yes    Birth control/protection: Post-menopausal  Lifestyle  . Physical activity    Days per week: Not on file     Minutes per session: Not on file  . Stress: Not on file  Relationships  . Social Herbalist on phone: Not on file    Gets together: Not on file    Attends religious service: Not on file    Active member of club or organization: Not on file    Attends meetings of clubs or organizations: Not on file    Relationship status: Not on file  . Intimate partner violence    Fear of current or ex partner: Not on file    Emotionally abused: Not on file    Physically abused: Not on file    Forced sexual activity: Not on file  Other Topics Concern  . Not on file  Social History Narrative  . Not on file    FAMILY HISTORY Family History  Problem Relation Age of Onset  . Throat cancer Cousin   . Throat cancer Cousin   . Leukemia Cousin     ALLERGIES:  is allergic to omeprazole.  MEDICATIONS:  Current Outpatient Medications  Medication Sig Dispense Refill  . calcium carbonate (TUMS - DOSED IN MG ELEMENTAL CALCIUM) 500 MG chewable tablet Chew 1 tablet by mouth as needed for indigestion or heartburn.    . lidocaine-prilocaine (EMLA) cream Apply 1 application topically as needed. Apply small amount to port site at least 1 hour prior to it  being accessed, cover with plastic wrap 30 g 1  . loperamide (IMODIUM) 2 MG capsule Take 1 capsule (2 mg total) by mouth See admin instructions. With onset of loose stool, take 29m followed by 274mevery 2 hours until 12 hours have passed without loose bowel movement. Maximum: 16 mg/day 120 capsule 1  . LYNPARZA 150 MG tablet TAKE TWO TABLETS BY MOUTH TWICE A DAY 120 tablet 2  . ondansetron (ZOFRAN) 8 MG tablet Take 1 tablet (8 mg total) by mouth 2 (two) times daily as needed for refractory nausea / vomiting. Start on day 3 after chemo. 30 tablet 1  . prochlorperazine (COMPAZINE) 10 MG tablet Take 1 tablet (10 mg total) by mouth every 6 (six) hours as needed (Nausea or vomiting). 30 tablet 1  . Tetrahydrozoline HCl (REDNESS RELIEVER EYE DROPS OP) Place 1  drop into both eyes as needed (for red eyes).     . vitamin B-12 (CYANOCOBALAMIN) 1000 MCG tablet Take 1 tablet (1,000 mcg total) by mouth daily. 90 tablet 4   No current facility-administered medications for this visit.     PHYSICAL EXAMINATION:  ECOG PERFORMANCE STATUS: 0 - Asymptomatic Vitals:   02/12/19 1025  BP: 112/71  Pulse: 69  Resp: 16  Temp: 97.7 F (36.5 C)    Filed Weights   02/12/19 1025  Weight: 122 lb 1.6 oz (55.4 kg)     Physical Exam  Constitutional: She is oriented to person, place, and time. No distress.  Thin built, walk independantly  HENT:  Head: Normocephalic and atraumatic.  Nose: Nose normal.  Mouth/Throat: Oropharynx is clear and moist. No oropharyngeal exudate.  Eyes: Pupils are equal, round, and reactive to light. EOM are normal. No scleral icterus.  Neck: Normal range of motion. Neck supple. No JVD present.  Cardiovascular: Normal rate and regular rhythm.  No murmur heard. Pulmonary/Chest: Effort normal and breath sounds normal. No respiratory distress. She has no rales. She exhibits no tenderness.  Abdominal: Soft. Bowel sounds are normal. She exhibits no distension. There is no abdominal tenderness.  Musculoskeletal: Normal range of motion.        General: No edema.  Lymphadenopathy:    She has no cervical adenopathy.  Neurological: She is alert and oriented to person, place, and time. No cranial nerve deficit. She exhibits normal muscle tone. Coordination normal.  Skin: Skin is warm and dry. She is not diaphoretic. No erythema.  Right anterior medi port +   Psychiatric: Affect and judgment normal.       SKIN: LABORATORY DATA: I have personally reviewed the data as listed: CBC    Component Value Date/Time   WBC 3.1 (L) 02/12/2019 0955   RBC 2.63 (L) 02/12/2019 0955   HGB 10.7 (L) 02/12/2019 0955   HCT 30.8 (L) 02/12/2019 0955   PLT 133 (L) 02/12/2019 0955   MCV 117.1 (H) 02/12/2019 0955   MCH 40.7 (H) 02/12/2019 0955   MCHC  34.7 02/12/2019 0955   RDW 14.3 02/12/2019 0955   LYMPHSABS 0.8 02/12/2019 0955   MONOABS 0.3 02/12/2019 0955   EOSABS 0.1 02/12/2019 0955   BASOSABS 0.0 02/12/2019 0955   CMP Latest Ref Rng & Units 02/12/2019 01/15/2019 12/18/2018  Glucose 70 - 99 mg/dL 98 96 102(H)  BUN 8 - 23 mg/dL '12 11 12  ' Creatinine 0.44 - 1.00 mg/dL 0.79 0.72 0.85  Sodium 135 - 145 mmol/L 132(L) 134(L) 135  Potassium 3.5 - 5.1 mmol/L 4.1 3.8 3.9  Chloride 98 - 111 mmol/L  101 104 103  CO2 22 - 32 mmol/L '24 24 24  ' Calcium 8.9 - 10.3 mg/dL 9.9 9.9 9.8  Total Protein 6.5 - 8.1 g/dL 7.2 7.4 6.8  Total Bilirubin 0.3 - 1.2 mg/dL 0.7 0.8 0.7  Alkaline Phos 38 - 126 U/L 57 59 56  AST 15 - 41 U/L 15 14(L) 14(L)  ALT 0 - 44 U/L '8 8 8    ' pathology 11/16/2016 Surgical Pathology  CASE: [id number]  PATIENT: Galileah Spivack  Surgical Pathology Report   SPECIMEN SUBMITTED:  A. Retroperitoneal adenopathy, left  DIAGNOSIS:  A. LYMPH NODE, LEFT RETROPERITONEAL; CT-GUIDED CORE BIOPSY:  - METASTATIC HIGH-GRADE SEROUS CARCINOMA.   RADIOGRAPHIC STUDIES: I have personally reviewed the radiological images as listed and agree with the findings in the report CT chest abdomen pelvis with contrast 11/13/2016 IMPRESSION: 1. Large heterogeneous central pelvic mass measures up to 12.6 cm. The lesion generates substantial mass-effect on the uterus, bladder, and pelvic sidewall anatomy. The mass appears to invade the mid sigmoid colon. Etiology of the central pelvic mass is not definitive by CT, but ovarian primary is suspected. The lesion becomes indistinguishable from the posterior uterus on some images and uterine origin is possible, but the uterus does not appear to be the epicenter of the mass and appears to be more displaced by it. MRI of the pelvis without and with contrast may prove helpful to better delineate the relationship of the uterus to the central pelvic mass although it may not be able to definitively localize the origin.  2. Bulky retroperitoneal and left pelvic sidewall lymphadenopathy. The retroperitoneal lymphadenopathy generates substantial mass-effect on the IVC and left renal vein. The external iliac veins along each pelvic sidewall are markedly attenuated by the mass/lymphadenopathy. Patency of these vessels cannot be definitely confirmed on this exam.  Pathology 03/14/2017   DIAGNOSIS:  A. OMENTUM; OMENTECTOMY:  - NO TUMOR SEEN.  - ONE NEGATIVE LYMPH NODE (0/1).   B. RIGHT FALLOPIAN TUBE AND OVARY; SALPINGO-OOPHORECTOMY:  - SMALL FOCI OF HIGH GRADE SEROUS CARCINOMA INVOLVING THE OVARY.  - MARKED THERAPY RELATED CHANGE.  - NO TUMOR SEEN IN THE FALLOPIAN TUBE.   C. UTERUS, CERVIX, LEFT FALLOPIAN TUBE AND OVARY; HYSTERECTOMY AND LEFT  SALPINGO-OOPHORECTOMY:  - NABOTHIAN CYSTS.  - CYSTIC ATROPHY OF THE ENDOMETRIUM.  - SEROSAL ADHESIONS.  - UNREMARKABLE FALLOPIAN TUBE.  - OVARY SHOWING TREATMENT RELATED CHANGE.   D. 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    RADIOGRAPHIC STUDIES: I have personally reviewed the radiological images as listed and agreed with the findings in the report.  02/28/2018 CT chest abdomen pelvis showed interval enlargement off and aorto caval lymph node which currently measures 1.4 cm in short axis.  This lymph node is in close proximity to the previously resected retroperitoneal lymphadenopathy and it was a concern for additional nodal metastasis.Small lung nodules stable.  08/28/2018 CT chest abdomen pevlis.  . Marked interval decrease in the abdominal aortocaval lymph node of concern on the prior study, measuring 5 mm short axis today compared to 14 mm on the prior study. 2. No new or progressive findings on today's exam to suggest new recurrent or metastatic disease. 3. Stable 6 mm left lower lobe pulmonary nodule.  ASSESSMENT/PLAN Cancer Staging Malignant neoplasm of ovary  [facility]) Staging form: Ovary, Fallopian Tube, and Primary Peritoneal Carcinoma, AJCC 8th Edition - Clinical stage from 11/22/2016: Stage IIIC (cT3c, cN1b, cM0) - Signed by Earlie Server, MD on  11/22/2016  1. Malignant neoplasm of ovary, unspecified laterality ([place])   2. Anemia associated with chemotherapy   3. Thrombocytopenia ([place])   4. Port-A-Cath in place   5. Encounter for antineoplastic chemotherapy   6. Vitamin B12 deficiency   7. Hyponatremia   : # Ovarian Cancer, local recurrence.  Currently on olaparib 300 mg twice daily. She tolerates well.  CA 125 18.7-->61.2-->111-->90.5-->69--> 54.8--> pending.  Labs are reviewed and discussed with patient. Counts are stable. Continue current regimen.  Plan repeat imaging  CT chest abdomen pelvis.   # Hyponatremia, slight lower sodium level today, chronic. Monitor. Encourage oral hydration.  #Chronic anemia, hemoglobin 10.7, macrocytic secondary to Olaparib continue to monitor. Stable.  #Thrombocytopenia,grade 1,stable. Monitor.  #Vitamin B12 deficiency, continue oral B12 1000 MCG daily.  [name]#Mediport  Continue port flush every 6 to 8 weeks  RTF  2 months.      Earlie Server, MD, PhD Hematology Oncology [facility] Pager- [phone] 02/12/19

## 2019-02-13 LAB — CA 125: Cancer Antigen (CA) 125: 29.1 U/mL (ref 0.0–38.1)

## 2019-02-18 ENCOUNTER — Other Ambulatory Visit: Payer: Self-pay

## 2019-02-18 NOTE — Progress Notes (Signed)
Patient pre screened for office appointment, no questions or concerns today. Patient reminded of upcoming appointment time and date. 

## 2019-02-19 ENCOUNTER — Other Ambulatory Visit: Payer: Self-pay

## 2019-02-19 ENCOUNTER — Inpatient Hospital Stay (HOSPITAL_BASED_OUTPATIENT_CLINIC_OR_DEPARTMENT_OTHER): Payer: Medicare Other | Admitting: Obstetrics and Gynecology

## 2019-02-19 VITALS — BP 110/59 | HR 81 | Temp 98.8°F | Resp 16 | Ht 68.5 in | Wt 122.1 lb

## 2019-02-19 DIAGNOSIS — Z9221 Personal history of antineoplastic chemotherapy: Secondary | ICD-10-CM | POA: Diagnosis not present

## 2019-02-19 DIAGNOSIS — Z5111 Encounter for antineoplastic chemotherapy: Secondary | ICD-10-CM

## 2019-02-19 DIAGNOSIS — Z79899 Other long term (current) drug therapy: Secondary | ICD-10-CM

## 2019-02-19 DIAGNOSIS — Z90722 Acquired absence of ovaries, bilateral: Secondary | ICD-10-CM | POA: Diagnosis not present

## 2019-02-19 DIAGNOSIS — C569 Malignant neoplasm of unspecified ovary: Secondary | ICD-10-CM | POA: Diagnosis not present

## 2019-02-19 DIAGNOSIS — Z90710 Acquired absence of both cervix and uterus: Secondary | ICD-10-CM | POA: Diagnosis not present

## 2019-02-19 DIAGNOSIS — D6481 Anemia due to antineoplastic chemotherapy: Secondary | ICD-10-CM | POA: Diagnosis not present

## 2019-02-19 NOTE — Progress Notes (Signed)
Gynecologic Oncology Interval Visit   Referring Provider: Dr. 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 G0P0 female with locally recurrent advanced ovarian cancer, currently on olaparib, who presents to clinic for follow up.   She has continued on olaparib (she only stopped for about a 1/2 a day) and her CA125 values have declined. She presents for a pelvic exam today. She saw Dr. Tasia Catchings on 02/12/2019. She had chronic anemia and grade 1 thrombocytopenia, secondary to olaparib. She is otherwise tolerating treatment very well.     CA125  Component     Latest Ref Rng & Units 11/18/2018 12/18/2018 02/12/2019  Cancer Antigen (CA) 125     0.0 - 38.1 U/mL 69.0 (H) 54.8 (H) 29.1     Oncology History: Katie Hill is a pleasant. G0P0 female with advanced ovarian cancer.  She presented to the ER 11/13/2016 with symptoms of right lower quadrant pain for 3-4 weeks, associated with nausea, bloating, no change in weight, urinary frequency, and change in bowel habits with more diffucult and stringy BM's.  CT CHEST, ABDOMEN, AND PELVIS WITH CONTRAST IMPRESSION: 1. Large heterogeneous central pelvic mass measures up to 12.6 cm. The lesion generates substantial mass-effect on the uterus, bladder, and pelvic sidewall anatomy. The mass appears to invade the mid sigmoid colon. Etiology of the central pelvic mass is not definitive by CT, but ovarian primary is suspected. The lesion becomes indistinguishable from the posterior uterus on some images and uterine origin is possible, but the uterus does not appear to be the epicenter of the mass and appears to be more displaced by it. MRI of the pelvis without and with contrast may prove helpful to better delineate the relationship of the uterus to the central pelvic mass although it may not be able to definitively localize the origin. 2. Bulky retroperitoneal and left pelvic  sidewall lymphadenopathy. The retroperitoneal lymphadenopathy generates substantial mass-effect on the IVC and left renal vein. The external iliac veins along each pelvic sidewall are markedly attenuated by the mass/lymphadenopathy. Patency of these vessels cannot be definitely confirmed on this exam.  11/16/16 LYMPH NODE, LEFT RETROPERITONEAL; CT-GUIDED CORE BIOPSY:  - METASTATIC HIGH-GRADE SEROUS CARCINOMA.   Decision was made to do neoadjuvant carbo/taxol chemotherapy.  CA125 fell from 4,382 to 64.6 (10/29).  CT scan showed dramatic response.  CT scan 10/14 Vascular/Lymphatic: Normal appearance of the abdominal aorta. Interval decrease in size of previously identified bulky retroperitoneal adenopathy. Index aortocaval node measures 1.6 x 2.6 cm, image 32 of series 2. Previously 4.4 x 5.3 cm. Index left periaortic nodal mass measures 2.2 x 1.3 cm, image 31 of series 2. Previously 3.7 x 4.7 cm. At the level of the bifurcation there is a low-attenuation nodal mass which measure 3.1 x 2.6 cm, image 44 of series 2. Previously 4.3 x 4.8 cm. Left common iliac node measures 1.2 cm, image 53 of series 2. Previously 2.2 cm. Left external iliac node measures 1.6 x 1.1 cm, image 67 of series 2. Previously 4.8 x 2.6 cm. Reproductive: Large mass centered around the uterus measures 9.4 x 5.5 cm, image 67 of series 2. Previously this measured 12.6 x 9.2 cm  On 03/14/2017 she underwent L/S converted to XL, TAH, BSO, right PA node resection, omentectomy, and LOA to no residual disease.   Pathology 03/14/2017  DIAGNOSIS:  A. OMENTUM; OMENTECTOMY:  - NO TUMOR SEEN.  - ONE NEGATIVE LYMPH NODE (  0/1).   B. RIGHT FALLOPIAN TUBE AND OVARY; SALPINGO-OOPHORECTOMY:  - SMALL FOCI OF HIGH GRADE SEROUS CARCINOMA INVOLVING THE OVARY.  - MARKED THERAPY RELATED CHANGE.  - NO TUMOR SEEN IN THE FALLOPIAN TUBE.   C. UTERUS, CERVIX, LEFT FALLOPIAN TUBE AND OVARY; HYSTERECTOMY AND LEFT  SALPINGO-OOPHORECTOMY:  - NABOTHIAN  CYSTS.  - CYSTIC ATROPHY OF THE ENDOMETRIUM.  - SEROSAL ADHESIONS.  - UNREMARKABLE FALLOPIAN TUBE.  - OVARY SHOWING TREATMENT RELATED CHANGE.   D. 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   Then completed an additional 3 cycles of carbo/Taxol and CA 125 was 7.4 05/09/17.  CA 125  14 in 5/19  21 in 8/19.   CT scan 11/22/17 IMPRESSION: 1. Interval debulking surgery with decrease in retroperitoneal and extraperitoneal lymphadenopathy. In some areas the lymphadenopathy has resolved completely. Peritoneal implant seen along the falciform ligament on the prior study has decreased. 2. 4 x 2 cm collection of complex fluid or soft tissue in the right cul-de-sac is in the region of the complex mass lesion seen on the previous study. Given that there is some apparent enhancement peripherally, residual/recurrent disease is a concern and close attention will be required. 3. Stable tiny bilateral pulmonary nodules with no pleural effusion.  CA125  25.7 12/24/17 46.3 02/18/18  02/28/2018- CT C/A/P  1. Compared to the prior examination there has been interval enlargement of an aortocaval lymph node which currently measures 1.4 cm in short axis (axial image 75 of series 2). This lymph node is in close proximity to the previously resected retroperitoneal lymphadenopathy and is very concerning for an additional nodal metastasis. 2. Other findings in the chest, abdomen and pelvis appear very similar to prior examinations, as discussed above. 3. Aortic atherosclerosis.  She received 3 cycles of carbo-Taxol 03/15/2018-04/26/2018. CA 125 improved to 6.0. She started olaparib 300 mg BID 2/20 for platinum-sensitive recurrent ovarian cancer (HRD positive).   CA125 04/05/2018    15.3 04/26/2018       6.3 05/17/2018         6.0 06/07/2018        6.9 06/21/2018 7.0 07/05/2018 8.4 08/02/2018 18.7 08/29/2018 61.2 (H) 09/23/2018  111.0  (H) 10/21/2018 90.5 (H)    11/04/18 MRI Abd/Pelvis IMPRESSION: 1. Interval progression of a retrocaval lymph node in the abdomen, now measuring 2.7 x 2.3 cm and concerning for progression of metastatic disease. 2. Aortocaval lymph node seen as decreased on the prior study is stable. This is unenlarged by MR criteria. 3. 3.3 x 1.6 cm homogeneous lesion posterior right pelvis is smaller than 02/28/2018. This is homogeneous and shows thin, smooth peripheral enhancement with no evidence for central enhancement and likely represents a chronic postoperative collection although metastatic disease not entirely excluded.    Genetic testing negative for 83 genes on [facility]'s Multi-Cancer panel (ALK, APC, ATM, AXIN2, BAP1, BARD1, BLM, BMPR1A, BRCA1, BRCA2, BRIP1, CASR, CDC73, CDH1, CDK4, CDKN1B, CDKN1C, CDKN2A, CEBPA, CHEK2, CTNNA1, DICER1, DIS3L2, EGFR, EPCAM, FH, FLCN, GATA2, GPC3, GREM1, HOXB13, HRAS, KIT, MAX, MEN1, MET, MITF, MLH1, MSH2, MSH3, MSH6, MUTYH, NBN, NF1, NF2, NTHL1, PALB2, PDGFRA, PHOX2B, PMS2, POLD1, POLE, POT1, PRKAR1A, PTCH1, PTEN, RAD50, RAD51C, RAD51D, RB1, RECQL4, RET, RUNX1, SDHA, SDHAF2, SDHB, SDHC, SDHD, SMAD4, SMARCA4, SMARCB1, SMARCE1, STK11, SUFU, TERC, TERT, TMEM127, TP53, TSC1, TSC2, VHL, WRN, WT1).  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 This is still considered a normal result.   Somatic tumor testing:  She has HRD  positive cancer, negative for somatic or germline BRCA mutation.   Patient Active Problem List   Diagnosis Date Noted  . Goals of care, counseling/discussion 03/09/2018  . Genetic testing 03/28/2017  . S/P total abdominal hysterectomy and bilateral salpingo-oophorectomy 03/14/2017  . Hyponatremia 12/12/2016  . Dehydration 12/08/2016  . Malignant neoplasm of ovary ([place]) 11/20/2016  . Pelvic mass 11/15/2016   Past Medical History:  Diagnosis Date  . Dysrhythmia   . Genetic testing 03/28/2017   Multi-Cancer panel (83  genes) @ [facility] - No pathogenic mutations detected  . High grade ovarian cancer ([place]) 11/20/2016  . Pelvic mass in female    Past Surgical History:  Procedure Laterality Date  . APPENDECTOMY    . LAPAROSCOPY N/A 03/14/2017   Procedure: LAPAROSCOPY OPERATIVE;  Surgeon: Mellody Drown, MD;  Location: [facility];  Service: Gynecology;  Laterality: N/A;  . LAPAROTOMY N/A 03/14/2017   Procedure: LAPAROTOMY;  Surgeon: Mellody Drown, MD;  Location: [facility];  Service: Gynecology;  Laterality: N/A;  . LYMPH NODE DISSECTION N/A 03/14/2017   Procedure: LYMPH NODE DISSECTION;  Surgeon: Mellody Drown, MD;  Location: [facility];  Service: Gynecology;  Laterality: N/A;  . OMENTECTOMY N/A 03/14/2017   Procedure: OMENTECTOMY;  Surgeon: Mellody Drown, MD;  Location: [facility];  Service: Gynecology;  Laterality: N/A;  . PORTA CATH INSERTION N/A 11/27/2016   Procedure: Glori Luis Cath Insertion;  Surgeon: Algernon Huxley, MD;  Location: [facility];  Service: Cardiovascular;  Laterality: N/A;   Past Gynecologic History:  Menarche: 17 Last Menstrual Period: 24 years ago History of Abnormal pap: no Last pap: years ago She does not have regular medical cancer and has not has screening mammogram, colonoscopy, or Pap smears. She has not had an abnormal Pap.   OB History    Gravida  0   Para  0   Term  0   Preterm  0   AB  0   Living  0     SAB  0   TAB  0   Ectopic  0   Multiple  0   Live Births  0          Family History  Problem Relation Age of Onset  . Throat cancer Cousin   . Throat cancer Cousin   . Leukemia Cousin    Social History   Socioeconomic History  . Marital status: Married    Spouse name: Not on file  . Number of children: Not on file  . Years of education: Not on file  . Highest education level: Not on file  Occupational History  . Not on file  Social Needs  . Financial resource strain: Not on file  . Food insecurity    Worry: Not on file     Inability: Not on file  . Transportation needs    Medical: Not on file    Non-medical: Not on file  Tobacco Use  . Smoking status: Never Smoker  . Smokeless tobacco: Never Used  Substance and Sexual Activity  . Alcohol use: No    Frequency: Never    Comment: use to be occ. but not any since 5 months  . Drug use: No  . Sexual activity: Yes    Birth control/protection: Post-menopausal  Lifestyle  . Physical activity    Days per week: Not on file    Minutes per session: Not on file  . Stress: Not on file  Relationships  . Social connections    Talks  on phone: Not on file    Gets together: Not on file    Attends religious service: Not on file    Active member of club or organization: Not on file    Attends meetings of clubs or organizations: Not on file    Relationship status: Not on file  Other Topics Concern  . Not on file  Social History Narrative  . Not on file   Allergies  Allergen Reactions  . Omeprazole Rash   Current Outpatient Medications on File Prior to Visit  Medication Sig Dispense Refill  . calcium carbonate (TUMS - DOSED IN MG ELEMENTAL CALCIUM) 500 MG chewable tablet Chew 1 tablet by mouth as needed for indigestion or heartburn.    . lidocaine-prilocaine (EMLA) cream Apply 1 application topically as needed. Apply small amount to port site at least 1 hour prior to it being accessed, cover with plastic wrap 30 g 1  . loperamide (IMODIUM) 2 MG capsule Take 1 capsule (2 mg total) by mouth See admin instructions. With onset of loose stool, take 84m followed by 21mevery 2 hours until 12 hours have passed without loose bowel movement. Maximum: 16 mg/day 120 capsule 1  . LYNPARZA 150 MG tablet TAKE TWO TABLETS BY MOUTH TWICE A DAY 120 tablet 2  . ondansetron (ZOFRAN) 8 MG tablet Take 1 tablet (8 mg total) by mouth 2 (two) times daily as needed for refractory nausea / vomiting. Start on day 3 after chemo. 30 tablet 1  . prochlorperazine (COMPAZINE) 10 MG tablet Take 1  tablet (10 mg total) by mouth every 6 (six) hours as needed (Nausea or vomiting). 30 tablet 1  . Tetrahydrozoline HCl (REDNESS RELIEVER EYE DROPS OP) Place 1 drop into both eyes as needed (for red eyes).     . vitamin B-12 (CYANOCOBALAMIN) 1000 MCG tablet Take 1 tablet (1,000 mcg total) by mouth daily. 90 tablet 4  . [DISCONTINUED] olaparib (LYNPARZA) 150 MG tablet Take 2 tablets (300 mg total) by mouth 2 (two) times daily. 120 tablet 2   No current facility-administered medications on file prior to visit.    Review of Systems General: no complaints  HEENT: no complaints  Lungs: no complaints  Cardiac: no complaints  GI: no complaints  GU: no complaints  Musculoskeletal: no complaints  Extremities: no complaints  Skin: no complaints  Neuro: no complaints  Endocrine: no complaints  Psych: no complaints       Objective:  Physical Examination:  Today's Vitals   02/19/19 1400  BP: (!) 110/59  Pulse: 81  Resp: 16  Temp: 98.8 F (37.1 C)  TempSrc: Temporal  SpO2: 100%  Weight: 122 lb 1.6 oz (55.4 kg)  Height: 5' 8.5" (1.74 m)   Body mass index is 18.3 kg/m.  ECOG Performance Status: 1 - Symptomatic but completely ambulatory  GENERAL: Patient is a well appearing female in no acute distress HEENT:  PERRL, neck supple with midline trachea. Thyroid without masses.  NODES:  No cervical, supraclavicular, or inguinal lymphadenopathy palpated.  ABDOMEN:  Soft, nontender.  Positive, normoactive bowel sounds. No ascites or hepatosplenomegaly, or masses.  EXTREMITIES:  No peripheral edema.   NEURO:  Nonfocal. Well oriented.  Appropriate affect.  Pelvic: EGBUS: no lesions Cervix: surgically absent Vagina: no lesions, no discharge or bleeding Uterus: surgically absent Adnexa: no palpable masses Rectovaginal: not done   Assessment:  ElDossie Ocanass a [age].o. female diagnosed with high grade serous ovarian cancer (HRD) on CT directed node  biopsy with partial bowel  obstruction and extensive bulky adenopathy 8/18. s/p 4 cycles of carbo/taxol chemotherapy with dramatic decline in CA125 and improvement on CT scan. Interval debulking surgery to no gross residual on 03/14/2017.  Reinitiation of chemotherapy on 03/28/2017 with normalization of CA125.  Platinum-sensitive recurrence with normalization with re-induction of carbo-Taxol 03/15/2018-04/26/2018. On olaparib since 2/20 for HRD positive cancer, with initial CA125 rise and then normalization CA125.   She does not have a germline mutation in BRCA1/2 or other related gene, but somatic tumor testing is positive for HRD.     Medical co-morbidities complicating care: prior abdominal surgery.  Plan:   Problem List Items Addressed This Visit      Genitourinary   Malignant neoplasm of ovary ([place]) - Primary   She will continue to see Dr. Tasia Catchings for evaluation on olaparib. She is tolerating therapy well.   Return to [place] clinic in 3 months.   If she has more significant progression of disease she would be treated with other FDA approved drugs including Doxil, gemcitabine and Avastin.  Also could be a candidate for clinical trial.  A total of 20 minutes were spent with the patient/family today; >50% was spent in education, counseling and coordination of care for platinum-sensitive  high grade serous ovarian cancer (HRD)  cancer.    Gaetana Michaelis, MD   CC:  Gae Dry,  MD [address] [place], [place] [address]  Dr. Tasia Catchings

## 2019-02-19 NOTE — Patient Instructions (Signed)
CA125  Component     Latest Ref Rng & Units 11/18/2018 12/18/2018 02/12/2019  Cancer Antigen (CA) 125     0.0 - 38.1 U/mL 69.0 (H) 54.8 (H) 29.1

## 2019-03-12 ENCOUNTER — Inpatient Hospital Stay: Payer: Medicare Other | Attending: Oncology

## 2019-03-12 ENCOUNTER — Other Ambulatory Visit: Payer: Self-pay

## 2019-03-12 DIAGNOSIS — C569 Malignant neoplasm of unspecified ovary: Secondary | ICD-10-CM | POA: Insufficient documentation

## 2019-03-12 DIAGNOSIS — Z95828 Presence of other vascular implants and grafts: Secondary | ICD-10-CM

## 2019-03-12 DIAGNOSIS — Z452 Encounter for adjustment and management of vascular access device: Secondary | ICD-10-CM | POA: Diagnosis not present

## 2019-03-12 MED ORDER — SODIUM CHLORIDE 0.9% FLUSH
10.0000 mL | Freq: Once | INTRAVENOUS | Status: AC
  Administered 2019-03-12: 10 mL via INTRAVENOUS
  Filled 2019-03-12: qty 10

## 2019-03-12 MED ORDER — HEPARIN SOD (PORK) LOCK FLUSH 100 UNIT/ML IV SOLN
500.0000 [IU] | Freq: Once | INTRAVENOUS | Status: AC
  Administered 2019-03-12: 500 [IU] via INTRAVENOUS

## 2019-04-14 ENCOUNTER — Other Ambulatory Visit: Payer: Self-pay

## 2019-04-14 ENCOUNTER — Inpatient Hospital Stay: Payer: Medicare Other | Attending: Oncology

## 2019-04-14 ENCOUNTER — Encounter: Payer: Self-pay | Admitting: Oncology

## 2019-04-14 ENCOUNTER — Inpatient Hospital Stay (HOSPITAL_BASED_OUTPATIENT_CLINIC_OR_DEPARTMENT_OTHER): Payer: Medicare Other | Admitting: Oncology

## 2019-04-14 VITALS — BP 110/71 | HR 68 | Temp 98.1°F | Resp 18 | Wt 122.4 lb

## 2019-04-14 DIAGNOSIS — Z5111 Encounter for antineoplastic chemotherapy: Secondary | ICD-10-CM | POA: Insufficient documentation

## 2019-04-14 DIAGNOSIS — R1900 Intra-abdominal and pelvic swelling, mass and lump, unspecified site: Secondary | ICD-10-CM | POA: Diagnosis not present

## 2019-04-14 DIAGNOSIS — R590 Localized enlarged lymph nodes: Secondary | ICD-10-CM | POA: Insufficient documentation

## 2019-04-14 DIAGNOSIS — E538 Deficiency of other specified B group vitamins: Secondary | ICD-10-CM | POA: Diagnosis not present

## 2019-04-14 DIAGNOSIS — Z95828 Presence of other vascular implants and grafts: Secondary | ICD-10-CM

## 2019-04-14 DIAGNOSIS — D6481 Anemia due to antineoplastic chemotherapy: Secondary | ICD-10-CM | POA: Diagnosis not present

## 2019-04-14 DIAGNOSIS — T451X5A Adverse effect of antineoplastic and immunosuppressive drugs, initial encounter: Secondary | ICD-10-CM | POA: Diagnosis not present

## 2019-04-14 DIAGNOSIS — Z452 Encounter for adjustment and management of vascular access device: Secondary | ICD-10-CM | POA: Insufficient documentation

## 2019-04-14 DIAGNOSIS — D696 Thrombocytopenia, unspecified: Secondary | ICD-10-CM | POA: Insufficient documentation

## 2019-04-14 DIAGNOSIS — C569 Malignant neoplasm of unspecified ovary: Secondary | ICD-10-CM | POA: Insufficient documentation

## 2019-04-14 LAB — CBC WITH DIFFERENTIAL/PLATELET
Abs Immature Granulocytes: 0.02 10*3/uL (ref 0.00–0.07)
Basophils Absolute: 0 10*3/uL (ref 0.0–0.1)
Basophils Relative: 1 %
Eosinophils Absolute: 0.1 10*3/uL (ref 0.0–0.5)
Eosinophils Relative: 2 %
HCT: 33.1 % — ABNORMAL LOW (ref 36.0–46.0)
Hemoglobin: 11.2 g/dL — ABNORMAL LOW (ref 12.0–15.0)
Immature Granulocytes: 1 %
Lymphocytes Relative: 34 %
Lymphs Abs: 1 10*3/uL (ref 0.7–4.0)
MCH: 39.7 pg — ABNORMAL HIGH (ref 26.0–34.0)
MCHC: 33.8 g/dL (ref 30.0–36.0)
MCV: 117.4 fL — ABNORMAL HIGH (ref 80.0–100.0)
Monocytes Absolute: 0.3 10*3/uL (ref 0.1–1.0)
Monocytes Relative: 9 %
Neutro Abs: 1.6 10*3/uL — ABNORMAL LOW (ref 1.7–7.7)
Neutrophils Relative %: 53 %
Platelets: 132 10*3/uL — ABNORMAL LOW (ref 150–400)
RBC: 2.82 MIL/uL — ABNORMAL LOW (ref 3.87–5.11)
RDW: 13.8 % (ref 11.5–15.5)
WBC: 3 10*3/uL — ABNORMAL LOW (ref 4.0–10.5)
nRBC: 0 % (ref 0.0–0.2)

## 2019-04-14 LAB — COMPREHENSIVE METABOLIC PANEL
ALT: 9 U/L (ref 0–44)
AST: 15 U/L (ref 15–41)
Albumin: 4.7 g/dL (ref 3.5–5.0)
Alkaline Phosphatase: 58 U/L (ref 38–126)
Anion gap: 8 (ref 5–15)
BUN: 14 mg/dL (ref 8–23)
CO2: 24 mmol/L (ref 22–32)
Calcium: 9.8 mg/dL (ref 8.9–10.3)
Chloride: 103 mmol/L (ref 98–111)
Creatinine, Ser: 0.76 mg/dL (ref 0.44–1.00)
GFR calc Af Amer: >60 mL/min (ref >60)
GFR calc non Af Amer: >60 mL/min (ref >60)
Glucose, Bld: 95 mg/dL (ref 70–99)
Potassium: 4.2 mmol/L (ref 3.5–5.1)
Sodium: 135 mmol/L (ref 135–145)
Total Bilirubin: 0.8 mg/dL (ref 0.3–1.2)
Total Protein: 7.4 g/dL (ref 6.5–8.1)

## 2019-04-14 NOTE — Progress Notes (Signed)
Patient does not offer any problems today.  

## 2019-04-15 NOTE — Progress Notes (Signed)
[place] [facility] Follow up visit  Patient Care Team: Patient, [name], [name], [name], [name], [name] She underwent  Neoadjuvant chemotherapy of carbo and taxol x 4  # Patient had debulking surgery on 03/14/2017. She had a laparoscopy with conversion to laparotomy, total hysterectomy, with bilateral salpingo oophorectomy, right aortic lymph node dissection, omentectomy. Pathology showed small foci of residual disease in ovary.   Genetic testing negative for83 genes on [facility]'s Multi-Cancer panel (ALK, APC, ATM, AXIN2, BAP1, BARD1, BLM, BMPR1A, BRCA1, BRCA2, BRIP1, CASR, CDC73, CDH1, CDK4, CDKN1B, CDKN1C, CDKN2A, CEBPA, CHEK2, CTNNA1, DICER1, DIS3L2, EGFR, EPCAM, FH, FLCN, GATA2, GPC3, GREM1, HOXB13, HRAS, KIT, MAX, MEN1, MET, MITF, MLH1, MSH2, MSH3, MSH6, MUTYH, NBN, NF1, NF2, NTHL1, PALB2, PDGFRA, PHOX2B, PMS2, POLD1, POLE, POT1, PRKAR1A, PTCH1, PTEN, RAD50, RAD51C, RAD51D, RB1, RECQL4, RET, RUNX1, SDHA, SDHAF2, SDHB, SDHC, SDHD, SMAD4, SMARCA4, SMARCB1, SMARCE1, STK11, SUFU, TERC, TERT, TMEM127, TP53, TSC1, TSC2, VHL, WRN, WT1).  A Variant of UncertainSignificancewas detected: [id number].106G>A (p.Gly36Arg). Myraid testing negative for somatic BRACA1/2, positive for HRD.   # HRD positive, was referred to [facility] for clinical trials of Olarparib maintenance. She opted out.  #  Treatment:  Stage IIIC Ovarian cancer:  #s/p Carboplatin and taxol x 4 neoadjuvant chemotherapy followed by debulking surgery  03/14/2017 # 03/28/2017 S/p Adjuvant carbo and taxol x3   Local recurrence, CA125 one 46.3 03/15/2018-05/17/2018 Carboplatin and Taxol x 4. CA125 decrease from 49.7 to 6 after 4 cycles of treatment.  Started on Olarparib '300mg'$  BID on 05/17/2018.  # was seen by Dr. Fransisca Connors for further evaluation due to rising Ca1 25 and MRI abdomenPelvis on 11/05/2018 showed Interval progression of, now measuring 2.7 x 2.3 cm and concerning for progression of metastatic disease.retrocaval lymph node in the abdomen Olaparib was held for short period of time. Her CA125 trended down again.  Dr. Fransisca Connors recommending resume olaparib and continue monitor. If she has more significant progression of disease she may be a candidate for clinical trial or will be treated with other chemotherapy drugs including Doxil, gemcitabine and Avastin.   INTERVAL HISTORY 69 y.o. female with above oncology history reviewed by me today presents for evaluation for chemotherapy for treatment of ovarian cancer  Patient reports feeling well at baseline.  She has no new complaints. Denies any pain, unintentional weight loss, abdominal pain, nausea vomiting.  Chronic fatigue is at baseline.      Review of Systems  Constitutional: Positive for fatigue. Negative for appetite change, chills and fever.  HENT:   Negative for hearing loss and voice change.   Eyes: Negative for eye problems.  Respiratory: Negative for chest tightness and cough.   Cardiovascular: Negative for chest pain.  Gastrointestinal: Negative for abdominal distention, abdominal pain and blood in stool.  Endocrine: Negative for hot flashes.  Genitourinary: Negative for bladder incontinence, difficulty urinating, frequency and hematuria.   Musculoskeletal: Negative for arthralgias.  Skin: Negative for itching and rash.  Neurological: Negative for extremity weakness.  Hematological: Negative for adenopathy.  Psychiatric/Behavioral: Negative for confusion.   MEDICAL  HISTORY: Past Medical History:  Diagnosis Date  . Dysrhythmia   . Genetic testing 03/28/2017   Multi-Cancer panel (83 genes) @ [facility] - No pathogenic mutations detected  . High grade ovarian cancer ([place]) 11/20/2016  . Pelvic mass in female     SURGICAL HISTORY: Past Surgical History:  Procedure Laterality Date  . APPENDECTOMY    . LAPAROSCOPY N/A 03/14/2017   Procedure: LAPAROSCOPY OPERATIVE;  Surgeon: Mellody Drown, MD;  Location: [facility] ORS;  Service: Gynecology;  Laterality: N/A;  . LAPAROTOMY N/A 03/14/2017   Procedure: LAPAROTOMY;  Surgeon: Mellody Drown, MD;  Location: [facility] OR[facility];  Service: Gynecology;  Laterality: N/A;  . LYMPH NODE DISSECTION N/A 03/14/2017   Procedure: LYMPH NODE DISSECTION;  Surgeon: Mellody Drown, MD;  Location: [facility] ORS;  Service: Gynecology;  Laterality: N/A;  . OMENTECTOMY N/A 03/14/2017   Procedure: OMENTECTOMY;  Surgeon: Mellody Drown, MD;  Location: [facility] OR[facility];  Service: Gynecology;  Laterality: N/A;  . PORTA CATH INSERTION N/A 11/27/2016   Procedure: Glori Luis Cath Insertion;  Surgeon: Algernon Huxley, MD;  Location: [facility];  Service: Cardiovascular;  Laterality: N/A;    SOCIAL HISTORY: Social History   Socioeconomic History  . Marital status: Married    Spouse name: Not on file  . Number of children: Not on file  . Years of education: Not on file  . Highest education level: Not on file  Occupational History  . Not on file  Tobacco Use  . Smoking status: Never Smoker  . Smokeless tobacco: Never Used  Substance and Sexual Activity  . Alcohol use: No    Comment: use to be occ. but not any since 5 months  . Drug use: No  . Sexual activity: Yes    Birth control/protection: Post-menopausal  Other Topics Concern  . Not on file  Social History Narrative  . Not on file   Social Determinants of Health   Financial Resource Strain:   . Difficulty of Paying Living Expenses: Not on file  Food Insecurity:   . Worried About Paediatric nurse in the Last Year: Not on file  . Ran Out of Food in the Last Year: Not on file  Transportation Needs:   . Lack of Transportation (Medical): Not on file  . Lack of Transportation (Non-Medical): Not on file  Physical Activity:   . Days of Exercise per Week: Not on file  . Minutes of Exercise per Session: Not on file  Stress:   . Feeling of Stress : Not on file  Social Connections:   . Frequency of Communication with Friends and Family: Not on file  . Frequency of Social Gatherings with Friends and Family: Not on file  . Attends Religious Services: Not on file  . Active Member of Clubs or Organizations: Not on file  . Attends Archivist Meetings: Not on file  . Marital Status: Not on file  Intimate Partner Violence:   . Fear of Current or Ex-Partner: Not on file  . Emotionally Abused: Not on file  . Physically Abused: Not on file  . Sexually Abused: Not on file    FAMILY HISTORY Family History  Problem Relation Age of Onset  . Throat cancer Cousin   . Throat cancer Cousin   . Leukemia Cousin     ALLERGIES:  is allergic to omeprazole.  MEDICATIONS:  Current Outpatient Medications  Medication Sig Dispense Refill  . calcium carbonate (TUMS - DOSED IN MG ELEMENTAL CALCIUM) 500 MG chewable tablet Chew 1 tablet by  mouth as needed for indigestion or heartburn.    . lidocaine-prilocaine (EMLA) cream Apply 1 application topically as needed. Apply small amount to port site at least 1 hour prior to it being accessed, cover with plastic wrap 30 g 1  . loperamide (IMODIUM) 2 MG capsule Take 1 capsule (2 mg total) by mouth See admin instructions. With onset of loose stool, take 62m followed by 235mevery 2 hours until 12 hours have passed without loose bowel movement. Maximum: 16 mg/day 120 capsule 1  . LYNPARZA 150 MG tablet TAKE TWO TABLETS BY MOUTH TWICE A DAY 120 tablet 2  . Tetrahydrozoline HCl (REDNESS RELIEVER EYE DROPS OP) Place 1 drop into both eyes as needed  (for red eyes).     . vitamin B-12 (CYANOCOBALAMIN) 1000 MCG tablet Take 1 tablet (1,000 mcg total) by mouth daily. 90 tablet 4  . ondansetron (ZOFRAN) 8 MG tablet Take 1 tablet (8 mg total) by mouth 2 (two) times daily as needed for refractory nausea / vomiting. Start on day 3 after chemo. (Patient not taking: Reported on 04/14/2019) 30 tablet 1  . prochlorperazine (COMPAZINE) 10 MG tablet Take 1 tablet (10 mg total) by mouth every 6 (six) hours as needed (Nausea or vomiting). (Patient not taking: Reported on 04/14/2019) 30 tablet 1   No current facility-administered medications for this visit.    PHYSICAL EXAMINATION:  ECOG PERFORMANCE STATUS: 0 - Asymptomatic There were no vitals filed for this visit.  There were no vitals filed for this visit.   Physical Exam  Constitutional: She is oriented to person, place, and time. No distress.  Thin built, she walks independantly  HENT:  Head: Normocephalic and atraumatic.  Nose: Nose normal.  Mouth/Throat: Oropharynx is clear and moist. No oropharyngeal exudate.  Eyes: Pupils are equal, round, and reactive to light. EOM are normal. No scleral icterus.  Neck: No JVD present.  Cardiovascular: Normal rate and regular rhythm.  No murmur heard. Pulmonary/Chest: Effort normal and breath sounds normal. No respiratory distress. She has no rales. She exhibits no tenderness.  Abdominal: Soft. Bowel sounds are normal. She exhibits no distension. There is no abdominal tenderness.  Musculoskeletal:        General: No edema. Normal range of motion.     Cervical back: Normal range of motion and neck supple.  Lymphadenopathy:    She has no cervical adenopathy.  Neurological: She is alert and oriented to person, place, and time. No cranial nerve deficit. She exhibits normal muscle tone. Coordination normal.  Skin: Skin is warm and dry. She is not diaphoretic. No erythema.  Right anterior medi port +   Psychiatric: Affect and judgment normal.      LABORATORY DATA: I have personally reviewed the data as listed: CBC    Component Value Date/Time   WBC 3.0 (L) 04/14/2019 1243   RBC 2.82 (L) 04/14/2019 1243   HGB 11.2 (L) 04/14/2019 1243   HCT 33.1 (L) 04/14/2019 1243   PLT 132 (L) 04/14/2019 1243   MCV 117.4 (H) 04/14/2019 1243   MCH 39.7 (H) 04/14/2019 1243   MCHC 33.8 04/14/2019 1243   RDW 13.8 04/14/2019 1243   LYMPHSABS 1.0 04/14/2019 1243   MONOABS 0.3 04/14/2019 1243   EOSABS 0.1 04/14/2019 1243   BASOSABS 0.0 04/14/2019 1243   CMP Latest Ref Rng & Units 04/14/2019 02/12/2019 01/15/2019  Glucose 70 - 99 mg/dL 95 98 96  BUN 8 - 23 mg/dL _0 Creatinine 0.44 - 1.00 mg/dL  0.76 0.79 0.72  Sodium 135 - 145 mmol/L 135 132(L) 134(L)  Potassium 3.5 - 5.1 mmol/L 4.2 4.1 3.8  Chloride 98 - 111 mmol/L 103 101 104  CO2 22 - 32 mmol/L _0 Calcium 8.9 - 10.3 mg/dL 9.8 9.9 9.9  Total Protein 6.5 - 8.1 g/dL 7.4 7.2 7.4  Total Bilirubin 0.3 - 1.2 mg/dL 0.8 0.7 0.8  Alkaline Phos 38 - 126 U/L 58 57 59  AST 15 - 41 U/L 15 15 14(L)  ALT 0 - 44 U/L _1 pathology 11/16/2016 Surgical Pathology  CASE: [id number]  PATIENT: Gunhild Sapien  Surgical Pathology Report   SPECIMEN SUBMITTED:  A. Retroperitoneal adenopathy, left  DIAGNOSIS:  A. LYMPH NODE, LEFT RETROPERITONEAL; CT-GUIDED CORE BIOPSY:  - METASTATIC HIGH-GRADE SEROUS CARCINOMA.   RADIOGRAPHIC STUDIES: I have personally reviewed the radiological images as listed and agree with the findings in the report CT chest abdomen pelvis with contrast 11/13/2016 IMPRESSION: 1. Large heterogeneous central pelvic mass measures up to 12.6 cm. The lesion generates substantial mass-effect on the uterus, bladder, and pelvic sidewall anatomy. The mass appears to invade the mid sigmoid colon. Etiology of the central pelvic mass is not definitive by CT, but ovarian primary is suspected. The lesion becomes indistinguishable from the posterior uterus on some images and uterine  origin is possible, but the uterus does not appear to be the epicenter of the mass and appears to be more displaced by it. MRI of the pelvis without and with contrast may prove helpful to better delineate the relationship of the uterus to the central pelvic mass although it may not be able to definitively localize the origin. 2. Bulky retroperitoneal and left pelvic sidewall lymphadenopathy. The retroperitoneal lymphadenopathy generates substantial mass-effect on the IVC and left renal vein. The external iliac veins along each pelvic sidewall are markedly attenuated by the mass/lymphadenopathy. Patency of these vessels cannot be definitely confirmed on this exam.  Pathology 03/14/2017   DIAGNOSIS:  A. OMENTUM; OMENTECTOMY:  - NO TUMOR SEEN.  - ONE NEGATIVE LYMPH NODE (0/1).   B. RIGHT FALLOPIAN TUBE AND OVARY; SALPINGO-OOPHORECTOMY:  - SMALL FOCI OF HIGH GRADE SEROUS CARCINOMA INVOLVING THE OVARY.  - MARKED THERAPY RELATED CHANGE.  - NO TUMOR SEEN IN THE FALLOPIAN TUBE.   C. UTERUS, CERVIX, LEFT FALLOPIAN TUBE AND OVARY; HYSTERECTOMY AND LEFT  SALPINGO-OOPHORECTOMY:  - NABOTHIAN CYSTS.  - CYSTIC ATROPHY OF THE ENDOMETRIUM.  - SEROSAL ADHESIONS.  - UNREMARKABLE FALLOPIAN TUBE.  - OVARY SHOWING TREATMENT RELATED CHANGE.   D. 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    RADIOGRAPHIC STUDIES: I have personally reviewed the radiological images as listed and agreed with the findings in the report.  02/28/2018 CT chest abdomen pelvis showed interval enlargement off and aorto caval lymph node which currently measures 1.4 cm in short axis.  This lymph node is in close proximity to the previously resected retroperitoneal lymphadenopathy and it was a concern for additional nodal metastasis.Small lung nodules stable.  08/28/2018 CT chest abdomen pevlis.  . Marked interval decrease in the abdominal aortocaval lymph  node of concern on the prior study, measuring 5 mm short axis today compared to 14 mm on the prior study. 2. No new or progressive findings on today's exam to suggest new recurrent or metastatic disease. 3. Stable 6 mm left lower lobe pulmonary nodule.  ASSESSMENT/PLAN Cancer Staging Malignant neoplasm of ovary [facility]) Staging form:  Ovary, Fallopian Tube, and Primary Peritoneal Carcinoma, AJCC 8th Edition - Clinical stage from 11/22/2016: Stage IIIC (cT3c, cN1b, cM0) - Signed by Earlie Server, MD on 11/22/2016  1. Malignant neoplasm of ovary, unspecified laterality ([place])   2. Encounter for antineoplastic chemotherapy   3. Port-A-Cath in place   4. Anemia associated with chemotherapy   : # Ovarian Cancer, local recurrence.  Currently on olaparib 300 mg twice daily. She tolerates well.  CA 125 18.7-->61.2-->111-->90.5-->69--> 54.8--> 29.1  Labs are reviewed and discussed with patient  Counts are stable.  Continue Olaparib 300 mg twice daily. Plan repeat imaging CT chest abdomen pelvis.  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  Continue to monitor. #Thrombocytopenia,grade 1, counts are stable.  Continue to monitor. #Vitamin B12 deficiency, continue vitamin B12 supplements.  Check B12 at the next visit. [name]#Mediport continue port flush.  Every 6 to 8 weeks  RTF  2 months.     Orders Placed This Encounter  Procedures  . CT Chest W Contrast    Standing Status:   Future    Standing Expiration Date:   04/13/2020    Order Specific Question:   If indicated for the ordered procedure, I authorize the administration of contrast media per Radiology protocol    Answer:   Yes    Order Specific Question:   Preferred imaging location?    Answer:   [facility]    Order Specific Question:   Radiology Contrast Protocol - do NOT remove file path    Answer:   \\[email or web address]\[email or web address]\CTProtocols.pdf  . CT Abdomen Pelvis W Contrast    Standing Status:   Future     Standing Expiration Date:   04/13/2020    Order Specific Question:   If indicated for the ordered procedure, I authorize the administration of contrast media per Radiology protocol    Answer:   Yes    Order Specific Question:   Preferred imaging location?    Answer:   [facility]    Order Specific Question:   Is Oral Contrast requested for this exam?    Answer:   Yes, Per Radiology protocol    Order Specific Question:   Radiology Contrast Protocol - do NOT remove file path    Answer:   \\[email or web address]\Radiant\CTProtocols.pdf  . CA 125    Standing Status:   Future    Standing Expiration Date:   04/13/2020  . Comprehensive metabolic panel    Standing Status:   Future    Standing Expiration Date:   04/13/2020  . CBC with Differential/Platelet    Standing Status:   Future    Standing Expiration Date:   04/13/2020    Earlie Server, MD, PhD Hematology Oncology [facility] Pager- [phone] 04/15/19

## 2019-04-16 LAB — CA 125: Cancer Antigen (CA) 125: 47 U/mL — ABNORMAL HIGH (ref 0.0–38.1)

## 2019-04-17 ENCOUNTER — Other Ambulatory Visit: Payer: Self-pay | Admitting: Oncology

## 2019-04-17 ENCOUNTER — Telehealth: Payer: Self-pay | Admitting: *Deleted

## 2019-04-17 DIAGNOSIS — C569 Malignant neoplasm of unspecified ovary: Secondary | ICD-10-CM

## 2019-04-17 NOTE — Telephone Encounter (Signed)
Patient called asking for results of her Tumor Markers, it has gone uo from 02/2019 test. Please call patient   CA 125 Order: [id number] Status:  Final result  Visible to patient:  No (inaccessible in [place])  Next appt:  04/28/2019 at 08:30 AM in Radiology ([facility]-CT)  Dx:  Malignant neoplasm of ovary, unspecif...  Ref Range & Units 3 d ago  Cancer Antigen (CA) 125 0.0 - 38.1 U/mL 47.0High    Comment: (NOTE)  Roche [employer]ostics Electrochemiluminescence Immunoassay (ECLIA)  Values obtained with different assay methods or kits cannot be  used interchangeably. Results cannot be interpreted as absolute  evidence of the presence or absence of malignant disease.  Performed At: [facility]  [place], [place] [id number]  Rush Farmer MD [id number]   Resulting Agency  [facility]      Specimen Collected: 04/14/19 12:43  Last Resulted: 04/16/19 03:36

## 2019-04-17 NOTE — Telephone Encounter (Signed)
CBC with Differential/Platelet Order: [id number]  Status: Final result Visible to patient: No (inaccessible in MyChart) Next appt: 04/28/2019 at 08:30 AM in Radiology ([facility]-CT) Dx: Malignant neoplasm of ovary, unspecif...    Ref Range & Units 3 d ago 2 mo ago 3 mo ago  WBC 4.0 - 10.5 K/uL 3.0 3.1 3.4  RBC 3.87 - 5.11 MIL/uL 2.82 2.63 2.57  Hemoglobin 12.0 - 15.0 g/dL 11.2 10.7 10.5  HCT 36.0 - 46.0 % 33.1 30.8 29.5  MCV 80.0 - 100.0 fL 117.4 117.1 114.8  MCH 26.0 - 34.0 pg 39.7 40.7 40.9  MCHC 30.0 - 36.0 g/dL 33.8  34.7  35.6   RDW 11.5 - 15.5 % 13.8  14.3  14.0   Platelets 150 - 400 K/uL 132 133 143  nRBC 0.0 - 0.2 % 0.0  0.0  0.0   Neutrophils Relative % % 53  62  53   Neutro Abs 1.7 - 7.7 K/uL 1.6 2.0  1.8   Lymphocytes Relative % 34  26  35   Lymphs Abs 0.7 - 4.0 K/uL 1.0  0.8  1.2   Monocytes Relative % 9  9  9    Monocytes Absolute 0.1 - 1.0 K/uL 0.3  0.3  0.3   Eosinophils Relative % 2  2  2    Eosinophils Absolute 0.0 - 0.5 K/uL 0.1  0.1  0.1   Basophils Relative % 1  1  1    Basophils Absolute 0.0 - 0.1 K/uL 0.0  0.0  0.0   Immature Granulocytes % 1  0  0   Abs Immature Granulocytes 0.00 - 0.07 K/uL 0.02  0.01 CM  0.01 CM   Comment: Performed at [facility], [address] [place], [place] [address]  Resulting Agency  [facility]     Specimen Collected: 04/14/19 12:43 Last Resulted: 04/14/19 13:00  Lab Flowsheet  Order Details  View Encounter  Lab and Collection Details  Routing  Result History   CM=Additional comments    Other Results from 04/14/2019 CA 125  Status: Final result Visible to patient: No (inaccessible in [place]) Next appt: 04/28/2019 at 08:30 AM in Radiology ([facility]-CT) Dx: Malignant neoplasm of ovary, unspecif...  Order: [id number]    Ref Range & Units 3 d ago 2 mo ago 4 mo ago  Cancer Antigen (CA) 125 0.0 - 38.1 U/mL 47.0 29.1 CM  54.8CM   Comment: (NOTE)  Roche Diagnostics Electrochemiluminescence Immunoassay (ECLIA)   Values obtained with different assay methods or kits cannot be  used interchangeably. Results cannot be interpreted as absolute  evidence of the presence or absence of malignant disease.  Performed At: [facility]  [address] [place], [place] [id number]  Rush Farmer MD [id number]    Resulting Agency  [facility] CL[facility] LAB [facility] CLIN LAB [facility] LAB     Specimen Collected: 04/14/19 12:43 Last Resulted: 04/16/19 03:36  Lab Flowsheet  Order Details  View Encounter  Lab and Collection Details  Routing  Result History   CM=Additional comments       Comprehensive metabolic panel  Status: Final result Visible to patient: No (inaccessible in MyChart) Next appt: 04/28/2019 at 08:30 AM in Radiology ([facility]-CT) Dx: Malignant neoplasm of ovary, unspecif...  Order: [id number]    Ref Range & Units 3 d ago 2 mo ago 3 mo ago  Sodium 135 - 145 mmol/L 135  132 134  Potassium 3.5 - 5.1 mmol/L 4.2  4.1  3.8   Chloride 98 - 111 mmol/L 103  101  104   CO2 22 - 32 mmol/L 24  24  24    Glucose, Bld 70 - 99 mg/dL 95  98  96   BUN 8 - 23 mg/dL 14  12  11    Creatinine, Ser 0.44 - 1.00 mg/dL 0.76  0.79  0.72   Calcium 8.9 - 10.3 mg/dL 9.8  9.9  9.9   Total Protein 6.5 - 8.1 g/dL 7.4  7.2  7.4   Albumin 3.5 - 5.0 g/dL 4.7  4.6  4.7   AST 15 - 41 U/L 15  15  14   ALT 0 - 44 U/L 9  8  8    Alkaline Phosphatase 38 - 126 U/L 58  57  59   Total Bilirubin 0.3 - 1.2 mg/dL 0.8  0.7  0.8   GFR calc non Af Amer >60 mL/min >60  60 mL/min" class="[id number]">60  60 mL/min" class="[id number]">60   GFR calc Af Amer >60 mL/min >60  60 mL/min" class="[id number]">60  60 mL/min" class="rz_[id number]">60   Anion gap 5 - 15 8  7  CM  6 CM   Comment: Performed at [facility], [address] [place], [place] [address]  Resulting Agency  [facility] CLIN LAB [facility] LAB [facility] LAB     Specimen Collected: 04/14/19 12:43 Last Resulted: 04/14/19 13:09

## 2019-04-18 NOTE — Telephone Encounter (Signed)
Results given to patient as she called again today

## 2019-04-28 ENCOUNTER — Ambulatory Visit
Admission: RE | Admit: 2019-04-28 | Discharge: 2019-04-28 | Disposition: A | Discharge status: Home / Self Care | Payer: Medicare Other | Source: Ambulatory Visit | Attending: Oncology | Admitting: Oncology

## 2019-04-28 ENCOUNTER — Other Ambulatory Visit: Payer: Self-pay

## 2019-04-28 DIAGNOSIS — R590 Localized enlarged lymph nodes: Secondary | ICD-10-CM | POA: Insufficient documentation

## 2019-04-28 DIAGNOSIS — C569 Malignant neoplasm of unspecified ovary: Secondary | ICD-10-CM | POA: Insufficient documentation

## 2019-04-28 DIAGNOSIS — Z90710 Acquired absence of both cervix and uterus: Secondary | ICD-10-CM | POA: Insufficient documentation

## 2019-04-28 DIAGNOSIS — C799 Secondary malignant neoplasm of unspecified site: Secondary | ICD-10-CM | POA: Diagnosis not present

## 2019-04-28 DIAGNOSIS — I700 Atherosclerosis of aorta: Secondary | ICD-10-CM | POA: Diagnosis not present

## 2019-04-28 DIAGNOSIS — C772 Secondary and unspecified malignant neoplasm of intra-abdominal lymph nodes: Secondary | ICD-10-CM | POA: Diagnosis not present

## 2019-04-28 DIAGNOSIS — Z5111 Encounter for antineoplastic chemotherapy: Secondary | ICD-10-CM

## 2019-04-28 MED ORDER — IOHEXOL 300 MG/ML  SOLN
85.0000 mL | Freq: Once | INTRAMUSCULAR | Status: AC | PRN
  Administered 2019-04-28: 09:00:00 85 mL via INTRAVENOUS

## 2019-04-30 ENCOUNTER — Other Ambulatory Visit: Payer: Self-pay | Admitting: Oncology

## 2019-05-07 ENCOUNTER — Inpatient Hospital Stay: Payer: Medicare Other

## 2019-05-07 ENCOUNTER — Other Ambulatory Visit: Payer: Self-pay

## 2019-05-07 DIAGNOSIS — C569 Malignant neoplasm of unspecified ovary: Secondary | ICD-10-CM | POA: Diagnosis not present

## 2019-05-07 DIAGNOSIS — Z95828 Presence of other vascular implants and grafts: Secondary | ICD-10-CM

## 2019-05-07 DIAGNOSIS — E538 Deficiency of other specified B group vitamins: Secondary | ICD-10-CM | POA: Diagnosis not present

## 2019-05-07 DIAGNOSIS — D696 Thrombocytopenia, unspecified: Secondary | ICD-10-CM | POA: Diagnosis not present

## 2019-05-07 DIAGNOSIS — D6481 Anemia due to antineoplastic chemotherapy: Secondary | ICD-10-CM | POA: Diagnosis not present

## 2019-05-07 DIAGNOSIS — R590 Localized enlarged lymph nodes: Secondary | ICD-10-CM | POA: Diagnosis not present

## 2019-05-07 DIAGNOSIS — R1900 Intra-abdominal and pelvic swelling, mass and lump, unspecified site: Secondary | ICD-10-CM | POA: Diagnosis not present

## 2019-05-07 MED ORDER — HEPARIN SOD (PORK) LOCK FLUSH 100 UNIT/ML IV SOLN
INTRAVENOUS | Status: AC
  Filled 2019-05-07: qty 5

## 2019-05-07 MED ORDER — HEPARIN SOD (PORK) LOCK FLUSH 100 UNIT/ML IV SOLN
500.0000 [IU] | Freq: Once | INTRAVENOUS | Status: AC
  Administered 2019-05-07: 14:00:00 500 [IU] via INTRAVENOUS
  Filled 2019-05-07: qty 5

## 2019-05-07 MED ORDER — SODIUM CHLORIDE 0.9% FLUSH
10.0000 mL | Freq: Once | INTRAVENOUS | Status: AC
  Administered 2019-05-07: 10 mL via INTRAVENOUS
  Filled 2019-05-07: qty 10

## 2019-05-21 ENCOUNTER — Other Ambulatory Visit: Payer: Medicare Other

## 2019-05-22 NOTE — Progress Notes (Signed)
Tumor Board Documentation  Katie Hill was presented by Earlie Server, MD at our Tumor Board on 05/21/2019, which included representatives from medical oncology, radiation oncology, surgical oncology, pathology, navigation.  [name] currently presents as a current patient, for [place], for progression, for discussion with history of the following treatments: adjuvant chemotherapy(PARP inhibitor).  Additionally, we reviewed previous medical and familial history, history of present illness, and recent lab results along with all available histopathologic and imaging studies. The tumor board considered available treatment options and made the following recommendations:   Radiological progression only. Continue Lynparza with CT surveillance.  The following procedures/referrals were also placed: No orders of the defined types were placed in this encounter.   Clinical Trial Status: unknown   Staging used:    National site-specific guidelines   were discussed with respect to the case.  Tumor board is a meeting of clinicians from various specialty areas who evaluate and discuss patients for whom a multidisciplinary approach is being considered. Final determinations in the plan of care are those of the provider(s). The responsibility for follow up of recommendations given during tumor board is that of the provider.   Today's extended care, comprehensive team conference, Saraii was not present for the discussion and was not examined.   Multidisciplinary Tumor Board is a multidisciplinary case peer review process.  Decisions discussed in the Multidisciplinary Tumor Board reflect the opinions of the specialists present at the conference without having examined the patient.  Ultimately, treatment and diagnostic decisions rest with the primary provider(s) and the patient.

## 2019-05-28 ENCOUNTER — Inpatient Hospital Stay: Payer: Medicare Other | Attending: Obstetrics and Gynecology | Admitting: Obstetrics and Gynecology

## 2019-05-28 ENCOUNTER — Inpatient Hospital Stay: Payer: Medicare Other

## 2019-05-28 ENCOUNTER — Other Ambulatory Visit: Payer: Self-pay

## 2019-05-28 VITALS — BP 113/66 | HR 68 | Temp 97.6°F | Resp 18 | Wt 123.7 lb

## 2019-05-28 DIAGNOSIS — Z90710 Acquired absence of both cervix and uterus: Secondary | ICD-10-CM | POA: Diagnosis not present

## 2019-05-28 DIAGNOSIS — C569 Malignant neoplasm of unspecified ovary: Secondary | ICD-10-CM

## 2019-05-28 DIAGNOSIS — Z90722 Acquired absence of ovaries, bilateral: Secondary | ICD-10-CM | POA: Diagnosis not present

## 2019-05-28 DIAGNOSIS — Z9221 Personal history of antineoplastic chemotherapy: Secondary | ICD-10-CM

## 2019-05-28 DIAGNOSIS — Z5111 Encounter for antineoplastic chemotherapy: Secondary | ICD-10-CM

## 2019-05-28 LAB — COMPREHENSIVE METABOLIC PANEL
ALT: 11 U/L (ref 0–44)
AST: 18 U/L (ref 15–41)
Albumin: 5 g/dL (ref 3.5–5.0)
Alkaline Phosphatase: 63 U/L (ref 38–126)
Anion gap: 10 (ref 5–15)
BUN: 11 mg/dL (ref 8–23)
CO2: 23 mmol/L (ref 22–32)
Calcium: 10.1 mg/dL (ref 8.9–10.3)
Chloride: 100 mmol/L (ref 98–111)
Creatinine, Ser: 0.74 mg/dL (ref 0.44–1.00)
GFR calc Af Amer: >60 mL/min (ref >60)
GFR calc non Af Amer: >60 mL/min (ref >60)
Glucose, Bld: 97 mg/dL (ref 70–99)
Potassium: 4 mmol/L (ref 3.5–5.1)
Sodium: 133 mmol/L — ABNORMAL LOW (ref 135–145)
Total Bilirubin: 0.7 mg/dL (ref 0.3–1.2)
Total Protein: 8.2 g/dL — ABNORMAL HIGH (ref 6.5–8.1)

## 2019-05-28 LAB — CBC WITH DIFFERENTIAL/PLATELET
Abs Immature Granulocytes: 0.02 10*3/uL (ref 0.00–0.07)
Basophils Absolute: 0 10*3/uL (ref 0.0–0.1)
Basophils Relative: 0 %
Eosinophils Absolute: 0 10*3/uL (ref 0.0–0.5)
Eosinophils Relative: 1 %
HCT: 34.8 % — ABNORMAL LOW (ref 36.0–46.0)
Hemoglobin: 11.7 g/dL — ABNORMAL LOW (ref 12.0–15.0)
Immature Granulocytes: 1 %
Lymphocytes Relative: 28 %
Lymphs Abs: 0.8 10*3/uL (ref 0.7–4.0)
MCH: 39.3 pg — ABNORMAL HIGH (ref 26.0–34.0)
MCHC: 33.6 g/dL (ref 30.0–36.0)
MCV: 116.8 fL — ABNORMAL HIGH (ref 80.0–100.0)
Monocytes Absolute: 0.2 10*3/uL (ref 0.1–1.0)
Monocytes Relative: 8 %
Neutro Abs: 1.8 10*3/uL (ref 1.7–7.7)
Neutrophils Relative %: 62 %
Platelets: 154 10*3/uL (ref 150–400)
RBC: 2.98 MIL/uL — ABNORMAL LOW (ref 3.87–5.11)
RDW: 13.8 % (ref 11.5–15.5)
WBC: 2.9 10*3/uL — ABNORMAL LOW (ref 4.0–10.5)
nRBC: 0 % (ref 0.0–0.2)

## 2019-05-28 NOTE — Progress Notes (Signed)
Gynecologic Oncology Interval Visit   Referring Provider: Dr. 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 G0P0 female with locally recurrent advanced ovarian cancer, currently on olaparib, who presents to clinic for follow up.   She was diagnosed with high grade serous ovarian cancer (HRD) on CT directed node biopsy with partial bowel obstruction and extensive bulky adenopathy on 11/2016 and received 4 cycles of carbo-taxol with dramatic decline in CA 125 and improvement in imaging. She underwent interval debulking surgery to no gross residual disease on 03/14/2017 followed by re-initiation of chemotherapy on 03/28/17 with normalization of CA-125. She had a platinum sensitive recurrence and received carbo-taxol recurred and received carbo-taxol 03/15/2018-04/26/2018 followed by olaparib since 2/20 for somatic HRD positive malignancy. Initially CA 125 rose, then normalized.   In the interim, she was felt to have radiologic progression. Case was discussed at [facility] and recommendation was to continue parp inhibitor as she continued to be asymptomatic. We had discussed other options for treatment should she have clinical progression.   CA125 04/14/19 47.0 02/12/20 29.1 12/18/2018 54.8  05/08/2019 CT C/A/P IMPRESSION: 1. Mild progression of metastatic disease. Mildly enlarged aortocaval lymph node is newly enlarged since 11/05/2018 MRI and 08/28/2018 CT studies. Separate enlarged retrocaval lymph node is stable since 11/05/2018 MRI and increased since 08/28/2018 CT. 2. Stable posterior right pelvic soft tissue focus, characterized as probable chronic postsurgical collection on prior MRI. 3.  Aortic Atherosclerosis (ICD10-I70.0).  Reviewed imaging 08/2018; 10/2018; 04/2019; Measurement vary on imaging reads. She does not have a baseline CT scan from 05/2018 1.Enlarged retrocaval lymph node measures 2.2 cm short axis diameter  (series 2/image 29), compared to 2.2 cm 11/05/2018 MRI using similar measuring technique and 1.5 cm on 08/28/2018 CT. 2. Posterior pelvic 3.2 x 1.9 cm soft tissue focus just to the right of midline (series 2/image 64), previously 3.5 x 1.6 cm on 08/28/2018 CT. 3.3 x 1.6 cm  On MRI 10/2018. 3. New enlarged PA node 1.5 cm (series 2/image 29)  Oncology History: Katie Hill is a pleasant. G0P0 female with advanced ovarian cancer.  She presented to the ER 11/13/2016 with symptoms of right lower quadrant pain for 3-4 weeks, associated with nausea, bloating, no change in weight, urinary frequency, and change in bowel habits with more diffucult and stringy BM's.  CT CHEST, ABDOMEN, AND PELVIS WITH CONTRAST IMPRESSION: 1. Large heterogeneous central pelvic mass measures up to 12.6 cm. The lesion generates substantial mass-effect on the uterus, bladder, and pelvic sidewall anatomy. The mass appears to invade the mid sigmoid colon. Etiology of the central pelvic mass is not definitive by CT, but ovarian primary is suspected. The lesion becomes indistinguishable from the posterior uterus on some images and uterine origin is possible, but the uterus does not appear to be the epicenter of the mass and appears to be more displaced by it. MRI of the pelvis without and with contrast may prove helpful to better delineate the relationship of the uterus to the central pelvic mass although it may not be able to definitively localize the origin. 2. Bulky retroperitoneal and left pelvic sidewall lymphadenopathy. The retroperitoneal lymphadenopathy generates substantial mass-effect on the IVC and left renal vein. The external iliac veins along each pelvic sidewall are markedly attenuated by the mass/lymphadenopathy. Patency of these vessels cannot be definitely confirmed on this exam.  11/16/16 LYMPH NODE, LEFT RETROPERITONEAL; CT-GUIDED CORE BIOPSY:  - METASTATIC HIGH-GRADE SEROUS CARCINOMA.  Decision was made to do  neoadjuvant carbo/taxol chemotherapy.  CA125 fell from 4,382 to 64.6 (10/29).  CT scan showed dramatic response.  CT scan 10/14 Vascular/Lymphatic: Normal appearance of the abdominal aorta. Interval decrease in size of previously identified bulky retroperitoneal adenopathy. Index aortocaval node measures 1.6 x 2.6 cm, image 32 of series 2. Previously 4.4 x 5.3 cm. Index left periaortic nodal mass measures 2.2 x 1.3 cm, image 31 of series 2. Previously 3.7 x 4.7 cm. At the level of the bifurcation there is a low-attenuation nodal mass which measure 3.1 x 2.6 cm, image 44 of series 2. Previously 4.3 x 4.8 cm. Left common iliac node measures 1.2 cm, image 53 of series 2. Previously 2.2 cm. Left external iliac node measures 1.6 x 1.1 cm, image 67 of series 2. Previously 4.8 x 2.6 cm. Reproductive: Large mass centered around the uterus measures 9.4 x 5.5 cm, image 67 of series 2. Previously this measured 12.6 x 9.2 cm  On 03/14/2017 she underwent L/S converted to XL, TAH, BSO, right PA node resection, omentectomy, and LOA to no residual disease.   Pathology 03/14/2017  DIAGNOSIS:  A. OMENTUM; OMENTECTOMY:  - NO TUMOR SEEN.  - ONE NEGATIVE LYMPH NODE (0/1).   B. RIGHT FALLOPIAN TUBE AND OVARY; SALPINGO-OOPHORECTOMY:  - SMALL FOCI OF HIGH GRADE SEROUS CARCINOMA INVOLVING THE OVARY.  - MARKED THERAPY RELATED CHANGE.  - NO TUMOR SEEN IN THE FALLOPIAN TUBE.   C. UTERUS, CERVIX, LEFT FALLOPIAN TUBE AND OVARY; HYSTERECTOMY AND LEFT  SALPINGO-OOPHORECTOMY:  - NABOTHIAN CYSTS.  - CYSTIC ATROPHY OF THE ENDOMETRIUM.  - SEROSAL ADHESIONS.  - UNREMARKABLE FALLOPIAN TUBE.  - OVARY SHOWING TREATMENT RELATED CHANGE.   D. 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   Then completed an additional 3 cycles of carbo/Taxol and CA 125 was 7.4 05/09/17.  CA 125  14 in 5/19  21 in 8/19.   CT scan 11/22/17 IMPRESSION: 1. Interval  debulking surgery with decrease in retroperitoneal and extraperitoneal lymphadenopathy. In some areas the lymphadenopathy has resolved completely. Peritoneal implant seen along the falciform ligament on the prior study has decreased. 2. 4 x 2 cm collection of complex fluid or soft tissue in the right cul-de-sac is in the region of the complex mass lesion seen on the previous study. Given that there is some apparent enhancement peripherally, residual/recurrent disease is a concern and close attention will be required. 3. Stable tiny bilateral pulmonary nodules with no pleural effusion.  CA125  25.7 12/24/17 46.3 02/18/18  02/28/2018- CT C/A/P  1. Compared to the prior examination there has been interval enlargement of an aortocaval lymph node which currently measures 1.4 cm in short axis (axial image 75 of series 2). This lymph node is in close proximity to the previously resected retroperitoneal lymphadenopathy and is very concerning for an additional nodal metastasis. 2. Other findings in the chest, abdomen and pelvis appear very similar to prior examinations, as discussed above. 3. Aortic atherosclerosis.  She received 3 cycles of carbo-Taxol 03/15/2018-04/26/2018. CA 125 improved to 6.0. She started olaparib 300 mg BID 2/20 for platinum-sensitive recurrent ovarian cancer (HRD positive).   CA125 04/05/2018    15.3 04/26/2018       6.3 05/17/2018         6.0 06/07/2018        6.9 06/21/2018 7.0 07/05/2018 8.4 08/02/2018 18.7 08/29/2018 61.2 (H) 09/23/2018  111.0 (H) 10/21/2018 90.5 (H)    11/04/18 MRI  Abd/Pelvis IMPRESSION: 1. Interval progression of a retrocaval lymph node in the abdomen, now measuring 2.7 x 2.3 cm and concerning for progression of metastatic disease. 2. Aortocaval lymph node seen as decreased on the prior study is stable. This is unenlarged by MR criteria. 3. 3.3 x 1.6 cm homogeneous lesion posterior right pelvis is smaller than 02/28/2018. This is homogeneous and shows thin,  smooth peripheral enhancement with no evidence for central enhancement and likely represents a chronic postoperative collection although metastatic disease not entirely excluded.  She has continued on olaparib (she only stopped for about a 1/2 a day) and her CA125 values have declined. She presents for a pelvic exam today. She saw Dr. Tasia Catchings on 02/12/2019. She had chronic anemia and grade 1 thrombocytopenia, secondary to olaparib. She is otherwise tolerating treatment very well.     Genetic testing negative for 83 genes on [facility]'s Multi-Cancer panel (ALK, APC, ATM, AXIN2, BAP1, BARD1, BLM, BMPR1A, BRCA1, BRCA2, BRIP1, CASR, CDC73, CDH1, CDK4, CDKN1B, CDKN1C, CDKN2A, CEBPA, CHEK2, CTNNA1, DICER1, DIS3L2, EGFR, EPCAM, FH, FLCN, GATA2, GPC3, GREM1, HOXB13, HRAS, KIT, MAX, MEN1, MET, MITF, MLH1, MSH2, MSH3, MSH6, MUTYH, NBN, NF1, NF2, NTHL1, PALB2, PDGFRA, PHOX2B, PMS2, POLD1, POLE, POT1, PRKAR1A, PTCH1, PTEN, RAD50, RAD51C, RAD51D, RB1, RECQL4, RET, RUNX1, SDHA, SDHAF2, SDHB, SDHC, SDHD, SMAD4, SMARCA4, SMARCB1, SMARCE1, STK11, SUFU, TERC, TERT, TMEM127, TP53, TSC1, TSC2, VHL, WRN, WT1).  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 This is still considered a normal result.   Somatic tumor testing:  She has HRD positive cancer, negative for somatic or germline BRCA mutation.   Patient Active Problem List   Diagnosis Date Noted  . Encounter for antineoplastic chemotherapy 04/14/2019  . Goals of care, counseling/discussion 03/09/2018  . Genetic testing 03/28/2017  . S/P total abdominal hysterectomy and bilateral salpingo-oophorectomy 03/14/2017  . Hyponatremia 12/12/2016  . Dehydration 12/08/2016  . Malignant neoplasm of ovary ([place]) 11/20/2016  . Pelvic mass 11/15/2016   Past Medical History:  Diagnosis Date  . Dysrhythmia   . Genetic testing 03/28/2017   Multi-Cancer panel (83 genes) @ [facility] - No pathogenic mutations detected  . High grade ovarian cancer ([place])  11/20/2016  . Pelvic mass in female    Past Surgical History:  Procedure Laterality Date  . APPENDECTOMY    . LAPAROSCOPY N/A 03/14/2017   Procedure: LAPAROSCOPY OPERATIVE;  Surgeon: Mellody Drown, MD;  Location: [facility] OR[facility];  Service: Gynecology;  Laterality: N/A;  . LAPAROTOMY N/A 03/14/2017   Procedure: LAPAROTOMY;  Surgeon: Mellody Drown, MD;  Location: [facility] OR[facility];  Service: Gynecology;  Laterality: N/A;  . LYMPH NODE DISSECTION N/A 03/14/2017   Procedure: LYMPH NODE DISSECTION;  Surgeon: Mellody Drown, MD;  Location: [facility];  Service: Gynecology;  Laterality: N/A;  . OMENTECTOMY N/A 03/14/2017   Procedure: OMENTECTOMY;  Surgeon: Mellody Drown, MD;  Location: [facility];  Service: Gynecology;  Laterality: N/A;  . PORTA CATH INSERTION N/A 11/27/2016   Procedure: Glori Luis Cath Insertion;  Surgeon: Algernon Huxley, MD;  Location: [facility];  Service: Cardiovascular;  Laterality: N/A;   Past Gynecologic History:  Menarche: 17 Last Menstrual Period: 24 years ago History of Abnormal pap: no Last pap: years ago She does not have regular medical cancer and has not has screening mammogram, colonoscopy, or Pap smears. She has not had an abnormal Pap.   OB History    Gravida  0   Para  0   Term  0   Preterm  0  AB  0   Living  0     SAB  0   TAB  0   Ectopic  0   Multiple  0   Live Births  0          Family History  Problem Relation Age of Onset  . Throat cancer Cousin   . Throat cancer Cousin   . Leukemia Cousin    Social History   Socioeconomic History  . Marital status: Married    Spouse name: Not on file  . Number of children: Not on file  . Years of education: Not on file  . Highest education level: Not on file  Occupational History  . Not on file  Tobacco Use  . Smoking status: Never Smoker  . Smokeless tobacco: Never Used  Substance and Sexual Activity  . Alcohol use: No    Comment: use to be occ. but not any since 5 months  . Drug use:  No  . Sexual activity: Yes    Birth control/protection: Post-menopausal  Other Topics Concern  . Not on file  Social History Narrative  . Not on file   Social Determinants of Health   Financial Resource Strain:   . Difficulty of Paying Living Expenses: Not on file  Food Insecurity:   . Worried About Charity fundraiser in the Last Year: Not on file  . Ran Out of Food in the Last Year: Not on file  Transportation Needs:   . Lack of Transportation (Medical): Not on file  . Lack of Transportation (Non-Medical): Not on file  Physical Activity:   . Days of Exercise per Week: Not on file  . Minutes of Exercise per Session: Not on file  Stress:   . Feeling of Stress : Not on file  Social Connections:   . Frequency of Communication with Friends and Family: Not on file  . Frequency of Social Gatherings with Friends and Family: Not on file  . Attends Religious Services: Not on file  . Active Member of Clubs or Organizations: Not on file  . Attends Archivist Meetings: Not on file  . Marital Status: Not on file   Allergies  Allergen Reactions  . Omeprazole Rash   Current Outpatient Medications on File Prior to Visit  Medication Sig Dispense Refill  . lidocaine-prilocaine (EMLA) cream Apply 1 application topically as needed. Apply small amount to port site at least 1 hour prior to it being accessed, cover with plastic wrap 30 g 1  . LYNPARZA 150 MG tablet TAKE TWO TABLETS BY MOUTH TWICE A DAY 120 tablet 2  . vitamin B-12 (CYANOCOBALAMIN) 1000 MCG tablet Take 1 tablet (1,000 mcg total) by mouth daily. 90 tablet 4  . [DISCONTINUED] olaparib (LYNPARZA) 150 MG tablet Take 2 tablets (300 mg total) by mouth 2 (two) times daily. 120 tablet 2   No current facility-administered medications on file prior to visit.   Review of Systems General:  no complaints Skin: no complaints Eyes: no complaints HEENT: no complaints Breasts: no complaints Pulmonary: no complaints Cardiac: no  complaints Gastrointestinal: no complaints Genitourinary/Sexual: no complaints Ob/Gyn: no complaints Musculoskeletal: no complaints Hematology: no complaints Neurologic/Psych: no complaints   Objective:  Physical Examination:  Today's Vitals   05/28/19 1138  BP: 113/66  Pulse: 68  Resp: 18  Temp: 97.6 F (36.4 C)  TempSrc: Tympanic  Weight: 123 lb 11.2 oz (56.1 kg)  PainSc: 0-No pain   Body mass index is 18.53 kg/m.  ECOG Performance Status: 1 - Symptomatic but completely ambulatory  GENERAL: Patient is a well appearing female in no acute distress HEENT:  Sclera clear. Anicteric NODES:  Negative axillary, supraclavicular, inguinal lymph node survery LUNGS:  Clear to auscultation bilaterally.   HEART:  Regular rate and rhythm.  ABDOMEN:  Soft, nontender.  No hernias, incisions well healed. No masses or ascites EXTREMITIES:  No peripheral edema. Atraumatic. No cyanosis SKIN:  Clear with no obvious rashes or skin changes.  NEURO:  Nonfocal. Well oriented.  Appropriate affect.  Pelvic: EGBUS: no lesions Cervix: surgically absent Vagina: no lesions, no discharge or bleeding Uterus: surgically absent Adnexa: no palpable masses Rectovaginal: performed and confirmatory. No pelvic masses palpated.    Assessment:  Katie Hill is a 69 y.o. female diagnosed with high grade serous ovarian cancer (HRD) on CT directed node biopsy with partial bowel obstruction and extensive bulky adenopathy 8/18. s/p 4 cycles of carbo/taxol chemotherapy with dramatic decline in CA125 and improvement on CT scan. Interval debulking surgery to no gross residual on 03/14/2017.  Reinitiation of chemotherapy on 03/28/2017 with normalization of CA125.  Platinum-sensitive recurrence with normalization with re-induction of carbo-Taxol 03/15/2018-04/26/2018. On olaparib since 2/20 for HRD positive cancer, with initial CA125 rise and then normalization CA125.   She does not have a germline mutation in  BRCA1/2 or other related gene, but somatic tumor testing is positive for HRD.     Medical co-morbidities complicating care: prior abdominal surgery.  Plan:   Problem List Items Addressed This Visit      Other   Encounter for antineoplastic chemotherapy    Other Visit Diagnoses    High grade ovarian cancer ([place])    -  Primary   Relevant Orders   Comprehensive metabolic panel   CBC with Differential/Platelet   CA 125     She will continue to see Dr. Tasia Catchings for evaluation on olaparib. She is tolerating therapy well. Imaging concerning for progressive disease but new aortocaval node is not a target lesion. I reviewed all her films. The measurements vary from imaging study but in totality she does not appear to have definitive metastatic disease. Check CA125, CBC, and CMP today and monthly.   Return to [place] clinic in 3 months.   If she has more significant progression of disease she would be treated with other FDA approved drugs including Doxil, gemcitabine and Avastin.  Also could be a candidate for clinical trial.  A total of over 40 minutes were spent with the patient/family today; >50% was spent in education, counseling and coordination of care for platinum-sensitive  high grade serous ovarian cancer (HRD) cancer.  I personally had a face to face interaction and evaluated the patient jointly with the NP, Ms. Beckey Rutter.  I have reviewed her history and available records and have performed the key portions of the physical exam including lymph node survey, abdominal exam, pelvic exam with my findings confirming those documented above by the APP.  I have discussed the case with the APP and the patient.  I agree with the above documentation, assessment and plan which was fully formulated by me.  Counseling was completed by me.   I personally saw the patient and performed a substantive portion of this encounter in conjunction with the listed APP as documented above.  Nils Thor Gaetana Michaelis,  MD    CC:  Gae Dry,  MD [address] [place], [place] [address]  Dr. Tasia Catchings

## 2019-05-29 ENCOUNTER — Telehealth: Payer: Self-pay | Admitting: Nurse Practitioner

## 2019-05-29 LAB — CA 125: Cancer Antigen (CA) 125: 71.2 U/mL — ABNORMAL HIGH (ref 0.0–38.1)

## 2019-05-29 NOTE — Telephone Encounter (Signed)
Spoke to patient and discussed results of bloodwork from yesterday. CA 125 slightly more elevated. She is currently asymptomatic. Per discussion from yesterday's visit, if CA 125 continues to trend up consider repeating scan sooner or if she becomes symptomatic, otherwise plan to repeat 3 months from January scan (around April 2021).

## 2019-06-02 ENCOUNTER — Telehealth: Payer: Self-pay | Admitting: Obstetrics and Gynecology

## 2019-06-02 NOTE — Telephone Encounter (Signed)
I called Katie Hill to discuss her results. Her CA125 is rising. I recommended to Dr. Tasia Catchings to repeat CT scan in March given the rise. Ms. Utech did not answer the phone and I left a message for her to call me. Beckey Rutter, NP has also reviewed her results with her.  Isolde Skaff Gaetana Michaelis, MD

## 2019-06-10 ENCOUNTER — Other Ambulatory Visit: Payer: Self-pay

## 2019-06-10 ENCOUNTER — Encounter: Payer: Self-pay | Admitting: Oncology

## 2019-06-10 ENCOUNTER — Inpatient Hospital Stay (HOSPITAL_BASED_OUTPATIENT_CLINIC_OR_DEPARTMENT_OTHER): Payer: Medicare Other | Admitting: Oncology

## 2019-06-10 ENCOUNTER — Inpatient Hospital Stay: Payer: Medicare Other | Attending: Oncology

## 2019-06-10 VITALS — BP 114/70 | HR 65 | Temp 97.5°F | Resp 18 | Wt 124.2 lb

## 2019-06-10 DIAGNOSIS — C569 Malignant neoplasm of unspecified ovary: Secondary | ICD-10-CM | POA: Diagnosis not present

## 2019-06-10 DIAGNOSIS — D6481 Anemia due to antineoplastic chemotherapy: Secondary | ICD-10-CM | POA: Insufficient documentation

## 2019-06-10 DIAGNOSIS — R590 Localized enlarged lymph nodes: Secondary | ICD-10-CM | POA: Diagnosis not present

## 2019-06-10 DIAGNOSIS — T451X5A Adverse effect of antineoplastic and immunosuppressive drugs, initial encounter: Secondary | ICD-10-CM | POA: Diagnosis not present

## 2019-06-10 DIAGNOSIS — Z9079 Acquired absence of other genital organ(s): Secondary | ICD-10-CM | POA: Diagnosis not present

## 2019-06-10 DIAGNOSIS — Z90722 Acquired absence of ovaries, bilateral: Secondary | ICD-10-CM | POA: Diagnosis not present

## 2019-06-10 DIAGNOSIS — Z79899 Other long term (current) drug therapy: Secondary | ICD-10-CM | POA: Diagnosis not present

## 2019-06-10 DIAGNOSIS — Z90710 Acquired absence of both cervix and uterus: Secondary | ICD-10-CM | POA: Insufficient documentation

## 2019-06-10 DIAGNOSIS — D696 Thrombocytopenia, unspecified: Secondary | ICD-10-CM | POA: Diagnosis not present

## 2019-06-10 DIAGNOSIS — C561 Malignant neoplasm of right ovary: Secondary | ICD-10-CM | POA: Insufficient documentation

## 2019-06-10 DIAGNOSIS — Z5111 Encounter for antineoplastic chemotherapy: Secondary | ICD-10-CM

## 2019-06-10 DIAGNOSIS — E538 Deficiency of other specified B group vitamins: Secondary | ICD-10-CM | POA: Diagnosis not present

## 2019-06-10 LAB — COMPREHENSIVE METABOLIC PANEL
ALT: 8 U/L (ref 0–44)
AST: 14 U/L — ABNORMAL LOW (ref 15–41)
Albumin: 4.3 g/dL (ref 3.5–5.0)
Alkaline Phosphatase: 56 U/L (ref 38–126)
Anion gap: 8 (ref 5–15)
BUN: 15 mg/dL (ref 8–23)
CO2: 23 mmol/L (ref 22–32)
Calcium: 9.9 mg/dL (ref 8.9–10.3)
Chloride: 102 mmol/L (ref 98–111)
Creatinine, Ser: 0.86 mg/dL (ref 0.44–1.00)
GFR calc Af Amer: >60 mL/min (ref >60)
GFR calc non Af Amer: >60 mL/min (ref >60)
Glucose, Bld: 97 mg/dL (ref 70–99)
Potassium: 4.4 mmol/L (ref 3.5–5.1)
Sodium: 133 mmol/L — ABNORMAL LOW (ref 135–145)
Total Bilirubin: 0.7 mg/dL (ref 0.3–1.2)
Total Protein: 7 g/dL (ref 6.5–8.1)

## 2019-06-10 LAB — CBC WITH DIFFERENTIAL/PLATELET
Abs Immature Granulocytes: 0.01 10*3/uL (ref 0.00–0.07)
Basophils Absolute: 0 10*3/uL (ref 0.0–0.1)
Basophils Relative: 1 %
Eosinophils Absolute: 0.1 10*3/uL (ref 0.0–0.5)
Eosinophils Relative: 2 %
HCT: 31.7 % — ABNORMAL LOW (ref 36.0–46.0)
Hemoglobin: 10.7 g/dL — ABNORMAL LOW (ref 12.0–15.0)
Immature Granulocytes: 0 %
Lymphocytes Relative: 33 %
Lymphs Abs: 1.1 10*3/uL (ref 0.7–4.0)
MCH: 39.5 pg — ABNORMAL HIGH (ref 26.0–34.0)
MCHC: 33.8 g/dL (ref 30.0–36.0)
MCV: 117 fL — ABNORMAL HIGH (ref 80.0–100.0)
Monocytes Absolute: 0.3 10*3/uL (ref 0.1–1.0)
Monocytes Relative: 9 %
Neutro Abs: 1.8 10*3/uL (ref 1.7–7.7)
Neutrophils Relative %: 55 %
Platelets: 130 10*3/uL — ABNORMAL LOW (ref 150–400)
RBC: 2.71 MIL/uL — ABNORMAL LOW (ref 3.87–5.11)
RDW: 13.9 % (ref 11.5–15.5)
WBC: 3.3 10*3/uL — ABNORMAL LOW (ref 4.0–10.5)
nRBC: 0 % (ref 0.0–0.2)

## 2019-06-10 NOTE — Progress Notes (Signed)
Patient contacted for follow up. No concerns voiced.

## 2019-06-11 LAB — CA 125: Cancer Antigen (CA) 125: 67.7 U/mL — ABNORMAL HIGH (ref 0.0–38.1)

## 2019-06-12 NOTE — Progress Notes (Signed)
[facility] Follow up visit  Patient Care Team: Patient, [name], [name], [name], [name], [name] She underwent  Neoadjuvant chemotherapy of carbo and taxol x 4  # Patient had debulking surgery on 03/14/2017. She had a laparoscopy with conversion to laparotomy, total hysterectomy, with bilateral salpingo oophorectomy, right aortic lymph node dissection, omentectomy. Pathology showed small foci of residual disease in ovary.   Genetic testing negative for83 genes on [facility]'s Multi-Cancer panel (ALK, APC, ATM, AXIN2, BAP1, BARD1, BLM, BMPR1A, BRCA1, BRCA2, BRIP1, CASR, CDC73, CDH1, CDK4, CDKN1B, CDKN1C, CDKN2A, CEBPA, CHEK2, CTNNA1, DICER1, DIS3L2, EGFR, EPCAM, FH, FLCN, GATA2, GPC3, GREM1, HOXB13, HRAS, KIT, MAX, MEN1, MET, MITF, MLH1, MSH2, MSH3, MSH6, MUTYH, NBN, NF1, NF2, NTHL1, PALB2, PDGFRA, PHOX2B, PMS2, POLD1, POLE, POT1, PRKAR1A, PTCH1, PTEN, RAD50, RAD51C, RAD51D, RB1, RECQL4, RET, RUNX1, SDHA, SDHAF2, SDHB, SDHC, SDHD, SMAD4, SMARCA4, SMARCB1, SMARCE1, STK11, SUFU, TERC, TERT, TMEM127, TP53, TSC1, TSC2, VHL, WRN, WT1).  A Variant of UncertainSignificancewas detected: CASRc.106G>A (p.Gly36Arg). Myraid testing negative for somatic BRACA1/2, positive for HRD.   # HRD positive, was referred to [facility] for clinical trials of Olarparib maintenance. She opted out.  #  Treatment:  Stage IIIC Ovarian cancer:  #s/p Carboplatin and taxol x 4 neoadjuvant chemotherapy followed by debulking surgery  03/14/2017 # 03/28/2017 S/p Adjuvant carbo and taxol x3   Local recurrence, CA125 one 46.3 03/15/2018-05/17/2018 Carboplatin and Taxol x 4. CA125 decrease from 49.7 to 6 after 4 cycles of treatment.  Started on Olarparib 355m BID on 05/17/2018.  # was seen by Dr. BFransisca Connorsfor further evaluation due to rising Ca1 25 and MRI abdomenPelvis on 11/05/2018 showed Interval progression of, now measuring 2.7 x 2.3 cm and concerning for progression of metastatic disease.retrocaval lymph node in the abdomen Olaparib was held for short period of time. Her CA125 trended down again.  Dr. BFransisca Connorsrecommending resume olaparib and continue monitor. If she has more significant progression of disease she may be a candidate for clinical trial or will be treated with other chemotherapy drugs including Doxil, gemcitabine and Avastin.   INTERVAL HISTORY 69y.o. female with above oncology history reviewed by me today presents for evaluation for chemotherapy for treatment of ovarian cancer  Patient was accompanied by her husband. She reports feeling well at baseline.  She denies any pain. Patient has been on olaparib and tolerates well.  She denies any side effects   Review of Systems  Constitutional: Negative for appetite change, chills, fatigue and fever.  HENT:   Negative for hearing loss and voice change.   Eyes: Negative for eye problems.  Respiratory: Negative for chest tightness and cough.   Cardiovascular: Negative for chest pain.  Gastrointestinal: Negative for abdominal distention, abdominal pain and blood in stool.  Endocrine: Negative for hot flashes.  Genitourinary: Negative for bladder incontinence, difficulty urinating, frequency and hematuria.   Musculoskeletal: Negative for arthralgias.  Skin: Negative for itching and rash.  Neurological: Negative for extremity weakness.  Hematological: Negative for adenopathy.  Psychiatric/Behavioral: Negative for confusion.   MEDICAL HISTORY: Past Medical  History:  Diagnosis Date  .  Dysrhythmia   . Genetic testing 03/28/2017   Multi-Cancer panel (83 genes) @ [facility] - No pathogenic mutations detected  . High grade ovarian cancer ([place]) 11/20/2016  . Pelvic mass in female     SURGICAL HISTORY: Past Surgical History:  Procedure Laterality Date  . APPENDECTOMY    . LAPAROSCOPY N/A 03/14/2017   Procedure: LAPAROSCOPY OPERATIVE;  Surgeon: Mellody Drown, MD;  Location: [facility];  Service: Gynecology;  Laterality: N/A;  . LAPAROTOMY N/A 03/14/2017   Procedure: LAPAROTOMY;  Surgeon: Mellody Drown, MD;  Location: [facility];  Service: Gynecology;  Laterality: N/A;  . LYMPH NODE DISSECTION N/A 03/14/2017   Procedure: LYMPH NODE DISSECTION;  Surgeon: Mellody Drown, MD;  Location: [facility];  Service: Gynecology;  Laterality: N/A;  . OMENTECTOMY N/A 03/14/2017   Procedure: OMENTECTOMY;  Surgeon: Mellody Drown, MD;  Location: [facility];  Service: Gynecology;  Laterality: N/A;  . PORTA CATH INSERTION N/A 11/27/2016   Procedure: Glori Luis Cath Insertion;  Surgeon: Algernon Huxley, MD;  Location: [facility];  Service: Cardiovascular;  Laterality: N/A;    SOCIAL HISTORY: Social History   Socioeconomic History  . Marital status: Married    Spouse name: Not on file  . Number of children: Not on file  . Years of education: Not on file  . Highest education level: Not on file  Occupational History  . Not on file  Tobacco Use  . Smoking status: Never Smoker  . Smokeless tobacco: Never Used  Substance and Sexual Activity  . Alcohol use: No    Comment: use to be occ. but not any since 5 months  . Drug use: No  . Sexual activity: Yes    Birth control/protection: Post-menopausal  Other Topics Concern  . Not on file  Social History Narrative  . Not on file   Social Determinants of Health   Financial Resource Strain:   . Difficulty of Paying Living Expenses: Not on file  Food Insecurity:   . Worried About Charity fundraiser in the Last  Year: Not on file  . Ran Out of Food in the Last Year: Not on file  Transportation Needs:   . Lack of Transportation (Medical): Not on file  . Lack of Transportation (Non-Medical): Not on file  Physical Activity:   . Days of Exercise per Week: Not on file  . Minutes of Exercise per Session: Not on file  Stress:   . Feeling of Stress : Not on file  Social Connections:   . Frequency of Communication with Friends and Family: Not on file  . Frequency of Social Gatherings with Friends and Family: Not on file  . Attends Religious Services: Not on file  . Active Member of Clubs or Organizations: Not on file  . Attends Archivist Meetings: Not on file  . Marital Status: Not on file  Intimate Partner Violence:   . Fear of Current or Ex-Partner: Not on file  . Emotionally Abused: Not on file  . Physically Abused: Not on file  . Sexually Abused: Not on file    FAMILY HISTORY Family History  Problem Relation Age of Onset  . Throat cancer Cousin   . Throat cancer Cousin   . Leukemia Cousin     ALLERGIES:  is allergic to omeprazole.  MEDICATIONS:  Current Outpatient Medications  Medication Sig Dispense Refill  . lidocaine-prilocaine (EMLA) cream Apply 1 application topically as needed. Apply small amount to port site at least 1 hour prior to  it being accessed, cover with plastic wrap 30 g 1  . LYNPARZA 150 MG tablet TAKE TWO TABLETS BY MOUTH TWICE A DAY 120 tablet 2  . vitamin B-12 (CYANOCOBALAMIN) 1000 MCG tablet Take 1 tablet (1,000 mcg total) by mouth daily. 90 tablet 4   No current facility-administered medications for this visit.    PHYSICAL EXAMINATION:  ECOG PERFORMANCE STATUS: 0 - Asymptomatic Vitals:   06/10/19 1422  BP: 114/70  Pulse: 65  Resp: 18  Temp: (!) 97.5 F (36.4 C)    Filed Weights   06/10/19 1422  Weight: 124 lb 3.2 oz (56.3 kg)     Physical Exam  Constitutional: She is oriented to person, place, and time. No distress.  Thin built, she  walks independantly  HENT:  Head: Normocephalic and atraumatic.  Nose: Nose normal.  Mouth/Throat: Oropharynx is clear and moist. No oropharyngeal exudate.  Eyes: Pupils are equal, round, and reactive to light. EOM are normal. No scleral icterus.  Neck: No JVD present.  Cardiovascular: Normal rate and regular rhythm.  No murmur heard. Pulmonary/Chest: Effort normal and breath sounds normal. No respiratory distress. She has no rales. She exhibits no tenderness.  Abdominal: Soft. Bowel sounds are normal. She exhibits no distension. There is no abdominal tenderness.  Musculoskeletal:        General: No edema. Normal range of motion.     Cervical back: Normal range of motion and neck supple.  Lymphadenopathy:    She has no cervical adenopathy.  Neurological: She is alert and oriented to person, place, and time. No cranial nerve deficit. She exhibits normal muscle tone. Coordination normal.  Skin: Skin is warm and dry. She is not diaphoretic. No erythema.  Right anterior medi port +   Psychiatric: Affect and judgment normal.     LABORATORY DATA: I have personally reviewed the data as listed: CBC    Component Value Date/Time   WBC 3.3 (L) 06/10/2019 1340   RBC 2.71 (L) 06/10/2019 1340   HGB 10.7 (L) 06/10/2019 1340   HCT 31.7 (L) 06/10/2019 1340   PLT 130 (L) 06/10/2019 1340   MCV 117.0 (H) 06/10/2019 1340   MCH 39.5 (H) 06/10/2019 1340   MCHC 33.8 06/10/2019 1340   RDW 13.9 06/10/2019 1340   LYMPHSABS 1.1 06/10/2019 1340   MONOABS 0.3 06/10/2019 1340   EOSABS 0.1 06/10/2019 1340   BASOSABS 0.0 06/10/2019 1340   CMP Latest Ref Rng & Units 06/10/2019 05/28/2019 04/14/2019  Glucose 70 - 99 mg/dL 97 97 95  BUN 8 - 23 mg/dL _0 Creatinine 0.44 - 1.00 mg/dL 0.86 0.74 0.76  Sodium 135 - 145 mmol/L 133(L) 133(L) 135  Potassium 3.5 - 5.1 mmol/L 4.4 4.0 4.2  Chloride 98 - 111 mmol/L 102 100 103  CO2 22 - 32 mmol/L _1 Calcium 8.9 - 10.3 mg/dL 9.9 10.1 9.8  Total Protein 6.5  - 8.1 g/dL 7.0 8.2(H) 7.4  Total Bilirubin 0.3 - 1.2 mg/dL 0.7 0.7 0.8  Alkaline Phos 38 - 126 U/L 56 63 58  AST 15 - 41 U/L 14(L) 18 15  ALT 0 - 44 U/L _2 Pathology 11/16/2016 Surgical Pathology  CASE: [id number]  PATIENT: Sofya Pizana  Surgical Pathology Report   SPECIMEN SUBMITTED:  A. Retroperitoneal adenopathy, left  DIAGNOSIS:  A. LYMPH NODE, LEFT RETROPERITONEAL; CT-GUIDED CORE BIOPSY:  - METASTATIC HIGH-GRADE SEROUS CARCINOMA.   RADIOGRAPHIC STUDIES: I have personally reviewed the radiological images as  listed and agree with the findings in the report CT chest abdomen pelvis with contrast 11/13/2016 IMPRESSION: 1. Large heterogeneous central pelvic mass measures up to 12.6 cm. The lesion generates substantial mass-effect on the uterus, bladder, and pelvic sidewall anatomy. The mass appears to invade the mid sigmoid colon. Etiology of the central pelvic mass is not definitive by CT, but ovarian primary is suspected. The lesion becomes indistinguishable from the posterior uterus on some images and uterine origin is possible, but the uterus does not appear to be the epicenter of the mass and appears to be more displaced by it. MRI of the pelvis without and with contrast may prove helpful to better delineate the relationship of the uterus to the central pelvic mass although it may not be able to definitively localize the origin. 2. Bulky retroperitoneal and left pelvic sidewall lymphadenopathy. The retroperitoneal lymphadenopathy generates substantial mass-effect on the IVC and left renal vein. The external iliac veins along each pelvic sidewall are markedly attenuated by the mass/lymphadenopathy. Patency of these vessels cannot be definitely confirmed on this exam.  Pathology 03/14/2017   DIAGNOSIS:  A. OMENTUM; OMENTECTOMY:  - NO TUMOR SEEN.  - ONE NEGATIVE LYMPH NODE (0/1).   B. RIGHT FALLOPIAN TUBE AND OVARY; SALPINGO-OOPHORECTOMY:  - SMALL FOCI OF HIGH GRADE  SEROUS CARCINOMA INVOLVING THE OVARY.  - MARKED THERAPY RELATED CHANGE.  - NO TUMOR SEEN IN THE FALLOPIAN TUBE.   C. UTERUS, CERVIX, LEFT FALLOPIAN TUBE AND OVARY; HYSTERECTOMY AND LEFT  SALPINGO-OOPHORECTOMY:  - NABOTHIAN CYSTS.  - CYSTIC ATROPHY OF THE ENDOMETRIUM.  - SEROSAL ADHESIONS.  - UNREMARKABLE FALLOPIAN TUBE.  - OVARY SHOWING TREATMENT RELATED CHANGE.   D. 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    RADIOGRAPHIC STUDIES: I have personally reviewed the radiological images as listed and agreed with the findings in the report. 02/28/2018 CT chest abdomen pelvis showed interval enlargement off and aorto caval lymph node which currently measures 1.4 cm in short axis.  This lymph node is in close proximity to the previously resected retroperitoneal lymphadenopathy and it was a concern for additional nodal metastasis.Small lung nodules stable. 08/28/2018 CT chest abdomen pevlis.  . Marked interval decrease in the abdominal aortocaval lymph node of concern on the prior study, measuring 5 mm short axis today compared to 14 mm on the prior study. 2. No new or progressive findings on today's exam to suggest new recurrent or metastatic disease. 3. Stable 6 mm left lower lobe pulmonary nodule.  ASSESSMENT/PLAN Cancer Staging Malignant neoplasm of ovary [facility]) Staging form: Ovary, Fallopian Tube, and Primary Peritoneal Carcinoma, AJCC 8th Edition - Clinical stage from 11/22/2016: Stage IIIC (cT3c, cN1b, cM0) - Signed by Earlie Server, MD on 11/22/2016  1. High grade ovarian cancer ([place])   2. Encounter for antineoplastic chemotherapy   3. Anemia associated with chemotherapy   4. Thrombocytopenia ([place])   : # Ovarian Cancer, local recurrence.  Currently on olaparib 300 mg twice daily. She tolerates well.  CA 125 18.7-->61.2-->111-->90.5-->69--> 54.8--> 29.1-->47-->71.2-->67.7 Labs reviewed and discussed with  patient. CA-125 has trended up. today's level slightly trended down. I had a discussion with Dr. Theora Gianotti and plan is to obtain a short-term follow-up of CT chest abdomen pelvis 2 months from last study. For now continue olaparib 300 mg twice daily.  #Chronic anemia, hemoglobin 11.2, macrocytosis is secondary to Olaparib, continue to monitor. #Thrombocytopenia,grade 1, counts are stable.  Monitor. #Vitamin B12 deficiency, continue vitamin B12 supplements.  Check vitamin B12. #Mediport continue port flush.  Continue flush every 6-8 weeks.  RTF 4 weeks Orders Placed This Encounter  Procedures  . CT Chest W Contrast    Standing Status:   Future    Standing Expiration Date:   06/09/2020    Order Specific Question:   ** REASON FOR EXAM (FREE TEXT)    Answer:   ovarian cancer    Order Specific Question:   If indicated for the ordered procedure, I authorize the administration of contrast media per Radiology protocol    Answer:   Yes    Order Specific Question:   Preferred imaging location?    Answer:   [facility]    Order Specific Question:   Radiology Contrast Protocol - do NOT remove file path    Answer:   \\[email or web address]  . CT Abdomen Pelvis W Contrast    Standing Status:   Future    Standing Expiration Date:   06/09/2020    Order Specific Question:   ** REASON FOR EXAM (FREE TEXT)    Answer:   ovarian cacer    Order Specific Question:   If indicated for the ordered procedure, I authorize the administration of contrast media per Radiology protocol    Answer:   Yes    Order Specific Question:   Preferred imaging location?    Answer:   [facility]    Order Specific Question:   Is Oral Contrast requested for this exam?    Answer:   Yes, Per Radiology protocol    Order Specific Question:   Radiology Contrast Protocol - do NOT remove file path    Answer:   \\[email or web address]  . CBC with Differential/Platelet    Standing  Status:   Future    Standing Expiration Date:   06/09/2020  . Comprehensive metabolic panel    Standing Status:   Future    Standing Expiration Date:   06/09/2020  . CA 125    Standing Status:   Future    Standing Expiration Date:   06/09/2020    Earlie Server, MD, PhD Hematology Oncology [facility] Pager- [phone] 06/12/19

## 2019-06-15 ENCOUNTER — Ambulatory Visit: Payer: Medicare Other | Attending: Internal Medicine

## 2019-06-15 DIAGNOSIS — Z23 Encounter for immunization: Secondary | ICD-10-CM | POA: Insufficient documentation

## 2019-06-15 NOTE — Progress Notes (Signed)
   Covid-19 Vaccination Clinic  Name:  Katie Hill    MRN: [id number] DOB: Nov 01, 1950  06/15/2019  [name] was observed post Covid-19 immunization for 15 minutes without incident. She was provided with Vaccine Information Sheet and instruction to access the V-Safe system.   Ms. May was instructed to call 911 with any severe reactions post vaccine: [name] Difficulty breathing  . Swelling of face and throat  . A fast heartbeat  . A bad rash all over body  . Dizziness and weakness   Immunizations Administered    Name Date Dose VIS Date Route   Pfizer COVID-19 Vaccine 06/15/2019 11:28 AM 0.3 mL 03/21/2019 Intramuscular   Manufacturer: [place]   Lot: [id number]   [place]: [id number]

## 2019-06-26 ENCOUNTER — Other Ambulatory Visit: Payer: Self-pay

## 2019-06-26 ENCOUNTER — Ambulatory Visit
Admission: RE | Admit: 2019-06-26 | Discharge: 2019-06-26 | Disposition: A | Discharge status: Home / Self Care | Payer: Medicare Other | Source: Ambulatory Visit | Attending: Oncology | Admitting: Oncology

## 2019-06-26 DIAGNOSIS — C569 Malignant neoplasm of unspecified ovary: Secondary | ICD-10-CM | POA: Insufficient documentation

## 2019-06-26 DIAGNOSIS — Z8543 Personal history of malignant neoplasm of ovary: Secondary | ICD-10-CM | POA: Diagnosis not present

## 2019-06-26 DIAGNOSIS — R918 Other nonspecific abnormal finding of lung field: Secondary | ICD-10-CM | POA: Diagnosis not present

## 2019-06-26 MED ORDER — IOHEXOL 300 MG/ML  SOLN
75.0000 mL | Freq: Once | INTRAMUSCULAR | Status: AC | PRN
  Administered 2019-06-26: 75 mL via INTRAVENOUS

## 2019-07-02 ENCOUNTER — Inpatient Hospital Stay: Payer: Medicare Other

## 2019-07-02 DIAGNOSIS — E538 Deficiency of other specified B group vitamins: Secondary | ICD-10-CM | POA: Diagnosis not present

## 2019-07-02 DIAGNOSIS — C561 Malignant neoplasm of right ovary: Secondary | ICD-10-CM | POA: Diagnosis not present

## 2019-07-02 DIAGNOSIS — C569 Malignant neoplasm of unspecified ovary: Secondary | ICD-10-CM

## 2019-07-02 DIAGNOSIS — D696 Thrombocytopenia, unspecified: Secondary | ICD-10-CM | POA: Diagnosis not present

## 2019-07-02 DIAGNOSIS — D6481 Anemia due to antineoplastic chemotherapy: Secondary | ICD-10-CM | POA: Diagnosis not present

## 2019-07-02 DIAGNOSIS — T451X5A Adverse effect of antineoplastic and immunosuppressive drugs, initial encounter: Secondary | ICD-10-CM | POA: Diagnosis not present

## 2019-07-02 DIAGNOSIS — Z95828 Presence of other vascular implants and grafts: Secondary | ICD-10-CM

## 2019-07-02 DIAGNOSIS — R590 Localized enlarged lymph nodes: Secondary | ICD-10-CM | POA: Diagnosis not present

## 2019-07-02 LAB — CBC WITH DIFFERENTIAL/PLATELET
Abs Immature Granulocytes: 0.01 10*3/uL (ref 0.00–0.07)
Basophils Absolute: 0 10*3/uL (ref 0.0–0.1)
Basophils Relative: 1 %
Eosinophils Absolute: 0.1 10*3/uL (ref 0.0–0.5)
Eosinophils Relative: 2 %
HCT: 30.6 % — ABNORMAL LOW (ref 36.0–46.0)
Hemoglobin: 10.9 g/dL — ABNORMAL LOW (ref 12.0–15.0)
Immature Granulocytes: 0 %
Lymphocytes Relative: 35 %
Lymphs Abs: 1.1 10*3/uL (ref 0.7–4.0)
MCH: 40.4 pg — ABNORMAL HIGH (ref 26.0–34.0)
MCHC: 35.6 g/dL (ref 30.0–36.0)
MCV: 113.3 fL — ABNORMAL HIGH (ref 80.0–100.0)
Monocytes Absolute: 0.4 10*3/uL (ref 0.1–1.0)
Monocytes Relative: 11 %
Neutro Abs: 1.6 10*3/uL — ABNORMAL LOW (ref 1.7–7.7)
Neutrophils Relative %: 51 %
Platelets: 137 10*3/uL — ABNORMAL LOW (ref 150–400)
RBC: 2.7 MIL/uL — ABNORMAL LOW (ref 3.87–5.11)
RDW: 14.1 % (ref 11.5–15.5)
WBC: 3.1 10*3/uL — ABNORMAL LOW (ref 4.0–10.5)
nRBC: 0 % (ref 0.0–0.2)

## 2019-07-02 LAB — COMPREHENSIVE METABOLIC PANEL
ALT: 9 U/L (ref 0–44)
AST: 14 U/L — ABNORMAL LOW (ref 15–41)
Albumin: 4.5 g/dL (ref 3.5–5.0)
Alkaline Phosphatase: 59 U/L (ref 38–126)
Anion gap: 8 (ref 5–15)
BUN: 12 mg/dL (ref 8–23)
CO2: 23 mmol/L (ref 22–32)
Calcium: 9.5 mg/dL (ref 8.9–10.3)
Chloride: 102 mmol/L (ref 98–111)
Creatinine, Ser: 0.72 mg/dL (ref 0.44–1.00)
GFR calc Af Amer: >60 mL/min (ref >60)
GFR calc non Af Amer: >60 mL/min (ref >60)
Glucose, Bld: 84 mg/dL (ref 70–99)
Potassium: 3.7 mmol/L (ref 3.5–5.1)
Sodium: 133 mmol/L — ABNORMAL LOW (ref 135–145)
Total Bilirubin: 0.8 mg/dL (ref 0.3–1.2)
Total Protein: 7 g/dL (ref 6.5–8.1)

## 2019-07-02 MED ORDER — SODIUM CHLORIDE 0.9% FLUSH
10.0000 mL | Freq: Once | INTRAVENOUS | Status: AC
  Administered 2019-07-02: 10 mL via INTRAVENOUS
  Filled 2019-07-02: qty 10

## 2019-07-02 MED ORDER — HEPARIN SOD (PORK) LOCK FLUSH 100 UNIT/ML IV SOLN
500.0000 [IU] | Freq: Once | INTRAVENOUS | Status: AC
  Administered 2019-07-02: 500 [IU] via INTRAVENOUS
  Filled 2019-07-02: qty 5

## 2019-07-03 LAB — CA 125: Cancer Antigen (CA) 125: 73.2 U/mL — ABNORMAL HIGH (ref 0.0–38.1)

## 2019-07-07 ENCOUNTER — Ambulatory Visit: Payer: Medicare Other | Attending: Internal Medicine

## 2019-07-07 DIAGNOSIS — Z23 Encounter for immunization: Secondary | ICD-10-CM

## 2019-07-07 NOTE — Progress Notes (Signed)
   Covid-19 Vaccination Clinic  Name:  Katie Hill    MRN: [id number] DOB: [birth year]  07/07/2019  Katie Hill was observed post Covid-19 immunization for 15 minutes without incident. She was provided with Vaccine Information Sheet and instruction to access the V-Safe system.   Katie Hill was instructed to call 911 with any severe reactions post vaccine: [name] Difficulty breathing  . Swelling of face and throat  . A fast heartbeat  . A bad rash all over body  . Dizziness and weakness   Immunizations Administered    Name Date Dose VIS Date Route   Pfizer COVID-19 Vaccine 07/07/2019 10:14 AM 0.3 mL 03/21/2019 Intramuscular   Manufacturer: [place]   Lot: [id number]   [place]: [id number]

## 2019-07-08 ENCOUNTER — Inpatient Hospital Stay (HOSPITAL_BASED_OUTPATIENT_CLINIC_OR_DEPARTMENT_OTHER): Payer: Medicare Other | Admitting: Oncology

## 2019-07-08 ENCOUNTER — Inpatient Hospital Stay: Payer: Medicare Other

## 2019-07-08 ENCOUNTER — Encounter: Payer: Self-pay | Admitting: Oncology

## 2019-07-08 VITALS — BP 111/73 | HR 75 | Temp 99.1°F | Resp 16 | Wt 122.4 lb

## 2019-07-08 DIAGNOSIS — Z95828 Presence of other vascular implants and grafts: Secondary | ICD-10-CM

## 2019-07-08 DIAGNOSIS — C561 Malignant neoplasm of right ovary: Secondary | ICD-10-CM | POA: Diagnosis not present

## 2019-07-08 DIAGNOSIS — T451X5A Adverse effect of antineoplastic and immunosuppressive drugs, initial encounter: Secondary | ICD-10-CM | POA: Diagnosis not present

## 2019-07-08 DIAGNOSIS — E538 Deficiency of other specified B group vitamins: Secondary | ICD-10-CM

## 2019-07-08 DIAGNOSIS — D6481 Anemia due to antineoplastic chemotherapy: Secondary | ICD-10-CM | POA: Diagnosis not present

## 2019-07-08 DIAGNOSIS — Z5111 Encounter for antineoplastic chemotherapy: Secondary | ICD-10-CM | POA: Diagnosis not present

## 2019-07-08 DIAGNOSIS — D696 Thrombocytopenia, unspecified: Secondary | ICD-10-CM

## 2019-07-08 DIAGNOSIS — R590 Localized enlarged lymph nodes: Secondary | ICD-10-CM | POA: Diagnosis not present

## 2019-07-08 DIAGNOSIS — C569 Malignant neoplasm of unspecified ovary: Secondary | ICD-10-CM

## 2019-07-08 NOTE — Progress Notes (Signed)
[place] [facility] Follow up visit  Patient Care Team: Patient, [name], [name], [name], [name], [name] She underwent  Neoadjuvant chemotherapy of carbo and taxol x 4  # Patient had debulking surgery on 03/14/2017. She had a laparoscopy with conversion to laparotomy, total hysterectomy, with bilateral salpingo oophorectomy, right aortic lymph node dissection, omentectomy. Pathology showed small foci of residual disease in ovary.   Genetic testing negative for83 genes on [facility]'s Multi-Cancer panel (ALK, APC, ATM, AXIN2, BAP1, BARD1, BLM, BMPR1A, BRCA1, BRCA2, BRIP1, CASR, CDC73, CDH1, CDK4, CDKN1B, CDKN1C, CDKN2A, CEBPA, CHEK2, CTNNA1, DICER1, DIS3L2, EGFR, EPCAM, FH, FLCN, GATA2, GPC3, GREM1, HOXB13, HRAS, KIT, MAX, MEN1, MET, MITF, MLH1, MSH2, MSH3, MSH6, MUTYH, NBN, NF1, NF2, NTHL1, PALB2, PDGFRA, PHOX2B, PMS2, POLD1, POLE, POT1, PRKAR1A, PTCH1, PTEN, RAD50, RAD51C, RAD51D, RB1, RECQL4, RET, RUNX1, SDHA, SDHAF2, SDHB, SDHC, SDHD, SMAD4, SMARCA4, SMARCB1, SMARCE1, STK11, SUFU, TERC, TERT, TMEM127, TP53, TSC1, TSC2, VHL, WRN, WT1).  A Variant of UncertainSignificancewas detected: CASRc.106G>A (p.Gly36Arg). Myraid testing negative for somatic BRACA1/2, positive for HRD.   # HRD positive, was referred to [facility] for clinical trials of Olarparib maintenance. She opted out.  #  Treatment:  Stage IIIC Ovarian cancer:  #s/p Carboplatin and taxol x 4 neoadjuvant chemotherapy followed by debulking surgery  03/14/2017 # 03/28/2017 S/p Adjuvant carbo and taxol x3   Local recurrence, CA125 one 46.3 03/15/2018-05/17/2018 Carboplatin and Taxol x 4. CA125 decrease from 49.7 to 6 after 4 cycles of treatment.  Started on Olarparib 361m BID on 05/17/2018.  # was seen by Dr. BFransisca Connorsfor further evaluation due to rising Ca1 25 and MRI abdomenPelvis on 11/05/2018 showed Interval progression of, now measuring 2.7 x 2.3 cm and concerning for progression of metastatic disease.retrocaval lymph node in the abdomen Olaparib was held for short period of time. Her CA125 trended down again.  Dr. BFransisca Connorsrecommending resume olaparib and continue monitor. If she has more significant progression of disease she may be a candidate for clinical trial or will be treated with other chemotherapy drugs including Doxil, gemcitabine and Avastin.   INTERVAL HISTORY 69y.o. female with above oncology history reviewed by me today presents for evaluation for chemotherapy for treatment of ovarian cancer  Patient was accompanied by her husband. Patient reports feeling well at baseline.  She denies any pain or new complaints today. Patient has been on olaparib and tolerates well. Review of Systems  Constitutional: Negative for appetite change, chills, fatigue and fever.  HENT:   Negative for hearing loss and voice change.   Eyes: Negative for eye problems.  Respiratory: Negative for chest tightness and cough.   Cardiovascular: Negative for chest pain.  Gastrointestinal: Negative for abdominal distention, abdominal pain and blood in stool.  Endocrine: Negative for hot flashes.  Genitourinary: Negative for bladder incontinence, difficulty urinating, frequency and hematuria.   Musculoskeletal: Negative for arthralgias.  Skin: Negative for itching and rash.  Neurological: Negative for extremity weakness.  Hematological: Negative for adenopathy.  Psychiatric/Behavioral: Negative for confusion.   MEDICAL HISTORY: Past Medical  History:  Diagnosis Date  . Dysrhythmia   .  Genetic testing 03/28/2017   Multi-Cancer panel (83 genes) @ [facility] - No pathogenic mutations detected  . High grade ovarian cancer ([place]) 11/20/2016  . Pelvic mass in female     SURGICAL HISTORY: Past Surgical History:  Procedure Laterality Date  . APPENDECTOMY    . LAPAROSCOPY N/A 03/14/2017   Procedure: LAPAROSCOPY OPERATIVE;  Surgeon: Mellody Drown, MD;  Location: [facility];  Service: Gynecology;  Laterality: N/A;  . LAPAROTOMY N/A 03/14/2017   Procedure: LAPAROTOMY;  Surgeon: Mellody Drown, MD;  Location: [facility];  Service: Gynecology;  Laterality: N/A;  . LYMPH NODE DISSECTION N/A 03/14/2017   Procedure: LYMPH NODE DISSECTION;  Surgeon: Mellody Drown, MD;  Location: [facility];  Service: Gynecology;  Laterality: N/A;  . OMENTECTOMY N/A 03/14/2017   Procedure: OMENTECTOMY;  Surgeon: Mellody Drown, MD;  Location: [facility];  Service: Gynecology;  Laterality: N/A;  . PORTA CATH INSERTION N/A 11/27/2016   Procedure: Glori Luis Cath Insertion;  Surgeon: Algernon Huxley, MD;  Location: [place] [facility];  Service: Cardiovascular;  Laterality: N/A;    SOCIAL HISTORY: Social History   Socioeconomic History  . Marital status: Married    Spouse name: Not on file  . Number of children: Not on file  . Years of education: Not on file  . Highest education level: Not on file  Occupational History  . Not on file  Tobacco Use  . Smoking status: Never Smoker  . Smokeless tobacco: Never Used  Substance and Sexual Activity  . Alcohol use: No    Comment: use to be occ. but not any since 5 months  . Drug use: No  . Sexual activity: Yes    Birth control/protection: Post-menopausal  Other Topics Concern  . Not on file  Social History Narrative  . Not on file   Social Determinants of Health   Financial Resource Strain:   . Difficulty of Paying Living Expenses:   Food Insecurity:   . Worried About Charity fundraiser in the Last Year:   .  Arboriculturist in the Last Year:   Transportation Needs:   . Film/video editor (Medical):   [name] Lack of Transportation (Non-Medical):   Physical Activity:   . Days of Exercise per Week:   . Minutes of Exercise per Session:   Stress:   . Feeling of Stress :   Social Connections:   . Frequency of Communication with Friends and Family:   . Frequency of Social Gatherings with Friends and Family:   . Attends Religious Services:   . Active Member of Clubs or Organizations:   . Attends Archivist Meetings:   [name] Marital Status:   Intimate Partner Violence:   . Fear of Current or Ex-Partner:   . Emotionally Abused:   [name] Physically Abused:   . Sexually Abused:     FAMILY HISTORY Family History  Problem Relation Age of Onset  . Throat cancer Cousin   . Throat cancer Cousin   . Leukemia Cousin     ALLERGIES:  is allergic to omeprazole.  MEDICATIONS:  Current Outpatient Medications  Medication Sig Dispense Refill  . lidocaine-prilocaine (EMLA) cream Apply 1 application topically as needed. Apply small amount to port site at least 1 hour prior to it being accessed, cover with plastic wrap 30 g 1  . LYNPARZA 150 MG tablet TAKE TWO TABLETS BY MOUTH TWICE A DAY 120 tablet 2  . vitamin B-12 (CYANOCOBALAMIN) 1000 MCG tablet Take 1 tablet (1,000 mcg  total) by mouth daily. 90 tablet 4   No current facility-administered medications for this visit.    PHYSICAL EXAMINATION:  ECOG PERFORMANCE STATUS: 0 - Asymptomatic Vitals:   07/08/19 1011  BP: 111/73  Pulse: 75  Resp: 16  Temp: 99.1 F (37.3 C)    Filed Weights   07/08/19 1011  Weight: 122 lb 6.4 oz (55.5 kg)     Physical Exam  Constitutional: She is oriented to person, place, and time. No distress.  Thin built, she walks independantly  HENT:  Head: Normocephalic and atraumatic.  Mouth/Throat: No oropharyngeal exudate.  Eyes: Pupils are equal, round, and reactive to light. EOM are normal. No scleral icterus.   Neck: No JVD present.  Cardiovascular: Normal rate and regular rhythm.  No murmur heard. Pulmonary/Chest: Effort normal and breath sounds normal. No respiratory distress. She has no rales. She exhibits no tenderness.  Abdominal: Soft. Bowel sounds are normal. She exhibits no distension. There is no abdominal tenderness.  Musculoskeletal:        General: No edema. Normal range of motion.     Cervical back: Normal range of motion and neck supple.  Lymphadenopathy:    She has no cervical adenopathy.  Neurological: She is alert and oriented to person, place, and time. No cranial nerve deficit. She exhibits normal muscle tone. Coordination normal.  Skin: Skin is warm and dry. She is not diaphoretic. No erythema.  Right anterior medi port +   Psychiatric: Affect and judgment normal.     LABORATORY DATA: I have personally reviewed the data as listed: CBC    Component Value Date/Time   WBC 3.1 (L) 07/02/2019 1321   RBC 2.70 (L) 07/02/2019 1321   HGB 10.9 (L) 07/02/2019 1321   HCT 30.6 (L) 07/02/2019 1321   PLT 137 (L) 07/02/2019 1321   MCV 113.3 (H) 07/02/2019 1321   MCH 40.4 (H) 07/02/2019 1321   MCHC 35.6 07/02/2019 1321   RDW 14.1 07/02/2019 1321   LYMPHSABS 1.1 07/02/2019 1321   MONOABS 0.4 07/02/2019 1321   EOSABS 0.1 07/02/2019 1321   BASOSABS 0.0 07/02/2019 1321   CMP Latest Ref Rng & Units 07/02/2019 06/10/2019 05/28/2019  Glucose 70 - 99 mg/dL 84 97 97  BUN 8 - 23 mg/dL '12 15 11  ' Creatinine 0.44 - 1.00 mg/dL 0.72 0.86 0.74  Sodium 135 - 145 mmol/L 133(L) 133(L) 133(L)  Potassium 3.5 - 5.1 mmol/L 3.7 4.4 4.0  Chloride 98 - 111 mmol/L 102 102 100  CO2 22 - 32 mmol/L '23 23 23  ' Calcium 8.9 - 10.3 mg/dL 9.5 9.9 10.1  Total Protein 6.5 - 8.1 g/dL 7.0 7.0 8.2(H)  Total Bilirubin 0.3 - 1.2 mg/dL 0.8 0.7 0.7  Alkaline Phos 38 - 126 U/L 59 56 63  AST 15 - 41 U/L 14(L) 14(L) 18  ALT 0 - 44 U/L '9 8 11    ' Pathology 11/16/2016 Surgical Pathology  CASE: [id number]  PATIENT:  Nakeesha Mcquitty  Surgical Pathology Report   SPECIMEN SUBMITTED:  A. Retroperitoneal adenopathy, left  DIAGNOSIS:  A. LYMPH NODE, LEFT RETROPERITONEAL; CT-GUIDED CORE BIOPSY:  - METASTATIC HIGH-GRADE SEROUS CARCINOMA.  Pathology 03/14/2017   DIAGNOSIS:  A. OMENTUM; OMENTECTOMY:  - NO TUMOR SEEN.  - ONE NEGATIVE LYMPH NODE (0/1).   B. RIGHT FALLOPIAN TUBE AND OVARY; SALPINGO-OOPHORECTOMY:  - SMALL FOCI OF HIGH GRADE SEROUS CARCINOMA INVOLVING THE OVARY.  - MARKED THERAPY RELATED CHANGE.  - NO TUMOR SEEN IN THE FALLOPIAN TUBE.   C. UTERUS,  CERVIX, LEFT FALLOPIAN TUBE AND OVARY; HYSTERECTOMY AND LEFT  SALPINGO-OOPHORECTOMY:  - NABOTHIAN CYSTS.  - CYSTIC ATROPHY OF THE ENDOMETRIUM.  - SEROSAL ADHESIONS.  - UNREMARKABLE FALLOPIAN TUBE.  - OVARY SHOWING TREATMENT RELATED CHANGE.   D. 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    RADIOGRAPHIC STUDIES: I have personally reviewed the radiological images as listed and agreed with the findings in the report. CT Chest W Contrast  Result Date: 06/26/2019 CLINICAL DATA:  69 year old female with history of ovarian cancer. Follow-up study. EXAM: CT CHEST, ABDOMEN, AND PELVIS WITH CONTRAST TECHNIQUE: Multidetector CT imaging of the chest, abdomen and pelvis was performed following the standard protocol during bolus administration of intravenous contrast. CONTRAST:  48m OMNIPAQUE IOHEXOL 300 MG/ML  SOLN COMPARISON:  CT the abdomen and pelvis 04/28/2019. CT the chest, abdomen and pelvis 08/28/2018. FINDINGS: CT CHEST FINDINGS Cardiovascular: Heart size is normal. There is no significant pericardial fluid, thickening or pericardial calcification. Aortic atherosclerosis. No definite coronary artery calcifications. Right internal jugular single-lumen porta cath with tip terminating in the distal superior vena cava. Mediastinum/Nodes: No pathologically enlarged mediastinal or  hilar lymph nodes. Esophagus is unremarkable in appearance. No axillary lymphadenopathy. Lungs/Pleura: Multiple small pulmonary nodules are again noted in the lungs bilaterally, stable in size and number compared to the prior study from 08/28/2018, largest of which measures 6 mm in the left lower lobe (axial image 93 of series 4). No other suspicious appearing pulmonary nodules or masses are noted. No acute consolidative airspace disease. No pleural effusions. Musculoskeletal: There are no aggressive appearing lytic or blastic lesions noted in the visualized portions of the skeleton. CT ABDOMEN PELVIS FINDINGS Hepatobiliary: No suspicious cystic or solid hepatic lesions. No intra or extrahepatic biliary ductal dilatation. Gallbladder is normal in appearance. Pancreas: No pancreatic mass. No pancreatic ductal dilatation. No pancreatic or peripancreatic fluid collections or inflammatory changes. Spleen: Unremarkable. Adrenals/Urinary Tract: Bilateral kidneys and adrenal glands are normal in appearance. No hydroureteronephrosis. Urinary bladder is normal in appearance. Stomach/Bowel: Normal appearance of the stomach. No pathologic dilatation of small bowel or colon. The appendix is not confidently identified and may be surgically absent. Regardless, there are no inflammatory changes noted adjacent to the cecum to suggest the presence of an acute appendicitis at this time. Vascular/Lymphatic: Aortic atherosclerosis, without evidence of aneurysm or dissection in the abdominal or pelvic vasculature. Enlarged retrocaval lymph nodes measuring up to 2 cm in short axis (axial image 73 of series 2), very similar to the prior examination. Adjacent aortocaval lymph node measuring 1.6 cm in short axis (axial image 71 of series 2) increased from 1.3 cm in short axis on the prior. Reproductive: Status post total abdominal hysterectomy and bilateral salpingo-oophorectomy. Persistent small 3.6 x 1.7 cm intermediate attenuation (35  HU) collection of material in the posterior aspect of the anatomic pelvis (axial image 107 of series 2), stable compared to the prior study. Other: No significant volume of ascites.  No pneumoperitoneum. Musculoskeletal: There are no aggressive appearing lytic or blastic lesions noted in the visualized portions of the skeleton. IMPRESSION: 1. Previously noted retrocaval lymph node is stable, while the adjacent aortocaval lymph node has slightly increased in size compared to the prior examination. Previously noted soft tissue attenuation lesion in the posterior aspect of the anatomic pelvis appear stable compared to the prior study. No other new sites of metastatic disease are noted elsewhere in the chest, abdomen or pelvis. 2. Previously noted small pulmonary nodules  are all stable in number and size compared to the prior examinations, favored to be benign. 3. Aortic atherosclerosis. Electronically Signed   By: Vinnie Langton M.D.   On: 06/26/2019 11:26   CT Abdomen Pelvis W Contrast  Result Date: 06/26/2019 CLINICAL DATA:  69 year old female with history of ovarian cancer. Follow-up study. EXAM: CT CHEST, ABDOMEN, AND PELVIS WITH CONTRAST TECHNIQUE: Multidetector CT imaging of the chest, abdomen and pelvis was performed following the standard protocol during bolus administration of intravenous contrast. CONTRAST:  88m OMNIPAQUE IOHEXOL 300 MG/ML  SOLN COMPARISON:  CT the abdomen and pelvis 04/28/2019. CT the chest, abdomen and pelvis 08/28/2018. FINDINGS: CT CHEST FINDINGS Cardiovascular: Heart size is normal. There is no significant pericardial fluid, thickening or pericardial calcification. Aortic atherosclerosis. No definite coronary artery calcifications. Right internal jugular single-lumen porta cath with tip terminating in the distal superior vena cava. Mediastinum/Nodes: No pathologically enlarged mediastinal or hilar lymph nodes. Esophagus is unremarkable in appearance. No axillary lymphadenopathy.  Lungs/Pleura: Multiple small pulmonary nodules are again noted in the lungs bilaterally, stable in size and number compared to the prior study from 08/28/2018, largest of which measures 6 mm in the left lower lobe (axial image 93 of series 4). No other suspicious appearing pulmonary nodules or masses are noted. No acute consolidative airspace disease. No pleural effusions. Musculoskeletal: There are no aggressive appearing lytic or blastic lesions noted in the visualized portions of the skeleton. CT ABDOMEN PELVIS FINDINGS Hepatobiliary: No suspicious cystic or solid hepatic lesions. No intra or extrahepatic biliary ductal dilatation. Gallbladder is normal in appearance. Pancreas: No pancreatic mass. No pancreatic ductal dilatation. No pancreatic or peripancreatic fluid collections or inflammatory changes. Spleen: Unremarkable. Adrenals/Urinary Tract: Bilateral kidneys and adrenal glands are normal in appearance. No hydroureteronephrosis. Urinary bladder is normal in appearance. Stomach/Bowel: Normal appearance of the stomach. No pathologic dilatation of small bowel or colon. The appendix is not confidently identified and may be surgically absent. Regardless, there are no inflammatory changes noted adjacent to the cecum to suggest the presence of an acute appendicitis at this time. Vascular/Lymphatic: Aortic atherosclerosis, without evidence of aneurysm or dissection in the abdominal or pelvic vasculature. Enlarged retrocaval lymph nodes measuring up to 2 cm in short axis (axial image 73 of series 2), very similar to the prior examination. Adjacent aortocaval lymph node measuring 1.6 cm in short axis (axial image 71 of series 2) increased from 1.3 cm in short axis on the prior. Reproductive: Status post total abdominal hysterectomy and bilateral salpingo-oophorectomy. Persistent small 3.6 x 1.7 cm intermediate attenuation (35 HU) collection of material in the posterior aspect of the anatomic pelvis (axial image 107  of series 2), stable compared to the prior study. Other: No significant volume of ascites.  No pneumoperitoneum. Musculoskeletal: There are no aggressive appearing lytic or blastic lesions noted in the visualized portions of the skeleton. IMPRESSION: 1. Previously noted retrocaval lymph node is stable, while the adjacent aortocaval lymph node has slightly increased in size compared to the prior examination. Previously noted soft tissue attenuation lesion in the posterior aspect of the anatomic pelvis appear stable compared to the prior study. No other new sites of metastatic disease are noted elsewhere in the chest, abdomen or pelvis. 2. Previously noted small pulmonary nodules are all stable in number and size compared to the prior examinations, favored to be benign. 3. Aortic atherosclerosis. Electronically Signed   By: DVinnie LangtonM.D.   On: 06/26/2019 11:26   CT Abdomen Pelvis W Contrast  Result  Date: 04/28/2019 CLINICAL DATA:  Stage III C ovarian cancer status post neoadjuvant chemotherapy and debulking surgery 03/14/2017 with subsequent recurrence with ongoing oral therapy. Restaging. EXAM: CT ABDOMEN AND PELVIS WITH CONTRAST TECHNIQUE: Multidetector CT imaging of the abdomen and pelvis was performed using the standard protocol following bolus administration of intravenous contrast. CONTRAST:  48m OMNIPAQUE IOHEXOL 300 MG/ML  SOLN COMPARISON:  11/05/2018 MRI abdomen/pelvis. 08/28/2018 CT chest, abdomen and pelvis. FINDINGS: Lower chest: Tiny subpleural nodules at the peripheral basilar right lower lobe, largest 3 mm (series 3/image 9), stable, probably benign. No acute abnormality at the lung bases. Hepatobiliary: Normal liver size. No liver mass. Normal gallbladder with no radiopaque cholelithiasis. No biliary ductal dilatation. Pancreas: Normal, with no mass or duct dilation. Spleen: Normal size. No mass. Adrenals/Urinary Tract: Normal adrenals. Scattered subcentimeter hypodense renal cortical  lesions in both kidneys are too small to characterize and are unchanged, considered benign. No new renal lesions. No hydronephrosis. Normal bladder. Stomach/Bowel: Normal non-distended stomach. Normal caliber small bowel with no small bowel wall thickening. Appendectomy. Oral contrast transits to the colon. Normal large bowel with no diverticulosis, large bowel wall thickening or pericolonic fat stranding. Vascular/Lymphatic: Mildly atherosclerotic nonaneurysmal abdominal aorta. Patent portal, splenic, hepatic and renal veins. Enlarged retrocaval lymph node measures 2.2 cm short axis diameter (series 2/image 29), compared to 2.2 cm 11/05/2018 MRI using similar measuring technique and 1.5 cm on 08/28/2018 CT. Enlarged 1.5 cm short axis diameter aortocaval node (series 2/image 29), newly enlarged. No additional pathologically enlarged lymph nodes in the abdomen or pelvis. Reproductive: Status post hysterectomy, with no abnormal findings at the vaginal cuff. No adnexal mass. Posterior pelvic 3.2 x 1.9 cm soft tissue focus just to the right of midline (series 2/image 64), previously 3.5 x 1.6 cm on 08/28/2018 CT, not appreciably changed, characterized as probable chronic postsurgical collection on prior MRI. Other: No pneumoperitoneum, ascites or focal fluid collection. Musculoskeletal: No aggressive appearing focal osseous lesions. IMPRESSION: 1. Mild progression of metastatic disease. Mildly enlarged aortocaval lymph node is newly enlarged since 11/05/2018 MRI and 08/28/2018 CT studies. Separate enlarged retrocaval lymph node is stable since 11/05/2018 MRI and increased since 08/28/2018 CT. 2. Stable posterior right pelvic soft tissue focus, characterized as probable chronic postsurgical collection on prior MRI. 3.  Aortic Atherosclerosis (ICD10-I70.0). Electronically Signed   By: JIlona SorrelM.D.   On: 04/28/2019 09:20    ASSESSMENT/PLAN Cancer Staging Malignant neoplasm of ovary ([facility] Staging form: Ovary,  Fallopian Tube, and Primary Peritoneal Carcinoma, AJCC 8th Edition - Clinical stage from 11/22/2016: Stage IIIC (cT3c, cN1b, cM0) - Signed by YEarlie Server MD on 11/22/2016  1. [place]   2. Port-A-Cath in place   3. Encounter for antineoplastic chemotherapy   4. Anemia associated with chemotherapy   5. Thrombocytopenia ([place]   6. Vitamin B12 deficiency   : # Ovarian Cancer, local recurrence.  Currently on olaparib 300 mg twice daily. She tolerates well.  CA 125 18.7-->61.2-->111-->90.5-->69--> 54.8--> 29.1-->47-->71.2-->67.7->73.2 Labs reviewed and discussed with patient. CA-125 are trending up. Short-term follow-up CT 06/26/2019 was independently reviewed by me and discussed with patient. Previously noted retrocaval lymph node is stable, adjacent aorta caval lymph node has slightly increasing size compared to prior examination.  Soft tissue attenuation lesion in the posterior aspect of the anastomic pelvis.  Stable.  No other new sites of metastatic disease.  Small lung nodules are stable.  Slow progression was discussed with patient.  For now I recommend to continue follow-up.  300 mg twice daily.  She tolerates well.  Continue follow-up CA-125 monthly. Plan repeat CT chest abdomen pelvis in 3 months, or earlier if CA-125 significantly trends up. She agrees with the plan. History of B12 deficiency, check B12 at the next visit. #Chronic anemia, hemoglobin 10.9, macrocytosis is secondary to Olaparib, stable continue to monitor. #Thrombocytopenia,grade 1, platelet count 1 37,000, stable.[name] #Mediport continue port flush.  Continue flush every 6-8 weeks.  RTF 4 weeks  Orders Placed This Encounter  Procedures  . CA 125    Standing Status:   Future    Standing Expiration Date:   07/07/2020  . CBC with Differential    Standing Status:   Future    Standing Expiration Date:   07/07/2020  . Comprehensive metabolic panel    Standing Status:   Future    Standing Expiration  Date:   07/07/2020    Earlie Server, MD, PhD Hematology Oncology [facility] at [facility] Pager- [phone] 07/08/19

## 2019-07-08 NOTE — Progress Notes (Signed)
Patient does not offer any problems today.  

## 2019-07-18 ENCOUNTER — Other Ambulatory Visit: Payer: Self-pay | Admitting: Oncology

## 2019-07-18 DIAGNOSIS — C569 Malignant neoplasm of unspecified ovary: Secondary | ICD-10-CM

## 2019-08-04 ENCOUNTER — Inpatient Hospital Stay: Payer: Medicare Other | Attending: Oncology

## 2019-08-04 ENCOUNTER — Other Ambulatory Visit: Payer: Self-pay

## 2019-08-04 DIAGNOSIS — D696 Thrombocytopenia, unspecified: Secondary | ICD-10-CM | POA: Diagnosis not present

## 2019-08-04 DIAGNOSIS — D6481 Anemia due to antineoplastic chemotherapy: Secondary | ICD-10-CM | POA: Diagnosis not present

## 2019-08-04 DIAGNOSIS — E538 Deficiency of other specified B group vitamins: Secondary | ICD-10-CM

## 2019-08-04 DIAGNOSIS — C569 Malignant neoplasm of unspecified ovary: Secondary | ICD-10-CM | POA: Insufficient documentation

## 2019-08-04 DIAGNOSIS — T451X5A Adverse effect of antineoplastic and immunosuppressive drugs, initial encounter: Secondary | ICD-10-CM

## 2019-08-04 LAB — CBC WITH DIFFERENTIAL/PLATELET
Abs Immature Granulocytes: 0.01 10*3/uL (ref 0.00–0.07)
Basophils Absolute: 0 10*3/uL (ref 0.0–0.1)
Basophils Relative: 1 %
Eosinophils Absolute: 0.1 10*3/uL (ref 0.0–0.5)
Eosinophils Relative: 2 %
HCT: 31.7 % — ABNORMAL LOW (ref 36.0–46.0)
Hemoglobin: 11.2 g/dL — ABNORMAL LOW (ref 12.0–15.0)
Immature Granulocytes: 0 %
Lymphocytes Relative: 34 %
Lymphs Abs: 1 10*3/uL (ref 0.7–4.0)
MCH: 40.4 pg — ABNORMAL HIGH (ref 26.0–34.0)
MCHC: 35.3 g/dL (ref 30.0–36.0)
MCV: 114.4 fL — ABNORMAL HIGH (ref 80.0–100.0)
Monocytes Absolute: 0.3 10*3/uL (ref 0.1–1.0)
Monocytes Relative: 10 %
Neutro Abs: 1.7 10*3/uL (ref 1.7–7.7)
Neutrophils Relative %: 53 %
Platelets: 125 10*3/uL — ABNORMAL LOW (ref 150–400)
RBC: 2.77 MIL/uL — ABNORMAL LOW (ref 3.87–5.11)
RDW: 13.9 % (ref 11.5–15.5)
WBC: 3.1 10*3/uL — ABNORMAL LOW (ref 4.0–10.5)
nRBC: 0 % (ref 0.0–0.2)

## 2019-08-04 LAB — COMPREHENSIVE METABOLIC PANEL
ALT: 10 U/L (ref 0–44)
AST: 15 U/L (ref 15–41)
Albumin: 4.6 g/dL (ref 3.5–5.0)
Alkaline Phosphatase: 60 U/L (ref 38–126)
Anion gap: 9 (ref 5–15)
BUN: 12 mg/dL (ref 8–23)
CO2: 24 mmol/L (ref 22–32)
Calcium: 9.8 mg/dL (ref 8.9–10.3)
Chloride: 100 mmol/L (ref 98–111)
Creatinine, Ser: 0.75 mg/dL (ref 0.44–1.00)
GFR calc Af Amer: >60 mL/min (ref >60)
GFR calc non Af Amer: >60 mL/min (ref >60)
Glucose, Bld: 100 mg/dL — ABNORMAL HIGH (ref 70–99)
Potassium: 4 mmol/L (ref 3.5–5.1)
Sodium: 133 mmol/L — ABNORMAL LOW (ref 135–145)
Total Bilirubin: 0.8 mg/dL (ref 0.3–1.2)
Total Protein: 7.5 g/dL (ref 6.5–8.1)

## 2019-08-04 LAB — VITAMIN B12: Vitamin B-12: 1396 pg/mL — ABNORMAL HIGH (ref 180–914)

## 2019-08-05 LAB — CA 125: Cancer Antigen (CA) 125: 82.8 U/mL — ABNORMAL HIGH (ref 0.0–38.1)

## 2019-08-06 ENCOUNTER — Encounter: Payer: Self-pay | Admitting: Oncology

## 2019-08-06 ENCOUNTER — Inpatient Hospital Stay (HOSPITAL_BASED_OUTPATIENT_CLINIC_OR_DEPARTMENT_OTHER): Payer: Medicare Other | Admitting: Oncology

## 2019-08-06 ENCOUNTER — Other Ambulatory Visit: Payer: Self-pay

## 2019-08-06 VITALS — BP 99/61 | HR 80 | Temp 97.4°F | Resp 18 | Wt 123.3 lb

## 2019-08-06 DIAGNOSIS — C569 Malignant neoplasm of unspecified ovary: Secondary | ICD-10-CM | POA: Diagnosis not present

## 2019-08-06 DIAGNOSIS — T451X5A Adverse effect of antineoplastic and immunosuppressive drugs, initial encounter: Secondary | ICD-10-CM

## 2019-08-06 DIAGNOSIS — D696 Thrombocytopenia, unspecified: Secondary | ICD-10-CM | POA: Diagnosis not present

## 2019-08-06 DIAGNOSIS — D6481 Anemia due to antineoplastic chemotherapy: Secondary | ICD-10-CM | POA: Diagnosis not present

## 2019-08-06 DIAGNOSIS — Z95828 Presence of other vascular implants and grafts: Secondary | ICD-10-CM | POA: Diagnosis not present

## 2019-08-06 DIAGNOSIS — Z5111 Encounter for antineoplastic chemotherapy: Secondary | ICD-10-CM | POA: Diagnosis not present

## 2019-08-06 NOTE — Progress Notes (Signed)
[name], [name], [name], [name], [name], [name] She underwent  Neoadjuvant chemotherapy of carbo and taxol x 4  # Patient had debulking surgery on 03/14/2017. She had a laparoscopy with conversion to laparotomy, total hysterectomy, with bilateral salpingo oophorectomy, right aortic lymph node dissection, omentectomy. Pathology showed small foci of residual disease in ovary.   Genetic testing negative for83 genes on [facility]'s Multi-Cancer panel (ALK, APC, ATM, AXIN2, BAP1, BARD1, BLM, BMPR1A, BRCA1, BRCA2, BRIP1, CASR, CDC73, CDH1, CDK4, CDKN1B, CDKN1C, CDKN2A, CEBPA, CHEK2, CTNNA1, DICER1, DIS3L2, EGFR, EPCAM, FH, FLCN, GATA2, GPC3, GREM1, HOXB13, HRAS, KIT, MAX, MEN1, MET, MITF, MLH1, MSH2, MSH3, MSH6, MUTYH, NBN, NF1, NF2, NTHL1, PALB2, PDGFRA, PHOX2B, PMS2, POLD1, POLE, POT1, PRKAR1A, PTCH1, PTEN, RAD50, RAD51C, RAD51D, RB1, RECQL4, RET, RUNX1, SDHA, SDHAF2, SDHB, SDHC, SDHD, SMAD4, SMARCA4, SMARCB1, SMARCE1, STK11, SUFU, TERC, TERT, TMEM127, TP53, TSC1, TSC2, VHL, WRN, WT1).  A Variant of UncertainSignificancewas detected: CASRc.106G>A (p.Gly36Arg). Myraid testing negative for somatic BRACA1/2, positive for HRD.   # HRD positive, was referred to [facility] for clinical trials of Olarparib maintenance. She opted out.  #  Treatment:  Stage IIIC Ovarian cancer:  #s/p Carboplatin and taxol x 4 neoadjuvant chemotherapy followed by debulking surgery  03/14/2017 # 03/28/2017 S/p Adjuvant carbo and taxol x3   Local recurrence, CA125 one 46.3 03/15/2018-05/17/2018 Carboplatin and Taxol x 4. CA125 decrease from 49.7 to 6 after 4 cycles of treatment.  Started on Olarparib 339m BID on 05/17/2018.  # was seen by Dr. BFransisca Connorsfor further evaluation due to rising Ca1 25 and MRI abdomenPelvis on 11/05/2018 showed Interval progression of, now measuring 2.7 x 2.3 cm and concerning for progression of metastatic disease.retrocaval lymph node in the abdomen Olaparib was held for short period of time. Her CA125 trended down again.  Dr. BFransisca Connorsrecommending resume olaparib and continue monitor. If she has more significant progression of disease she may be a candidate for clinical trial or will be treated with other chemotherapy drugs including Doxil, gemcitabine and Avastin.   INTERVAL HISTORY 69y.o. female with above oncology history reviewed by me today presents for evaluation for chemotherapy for treatment of ovarian cancer  Patient was accompanied by her husband. Patient reports feeling well.  Occasionally she feels bloated, feedings improved after bowel movements. Patient has been on olaparib and tolerates Well.  No new complaints. . Review of Systems  Constitutional: Negative for appetite change, chills, fatigue and fever.  HENT:   Negative for hearing loss and voice change.   Eyes: Negative for eye problems.  Respiratory: Negative for chest tightness and cough.   Cardiovascular: Negative for chest pain.  Gastrointestinal: Negative for abdominal distention, abdominal pain and blood in stool.  Endocrine: Negative for hot flashes.  Genitourinary: Negative for bladder incontinence, difficulty urinating, frequency and hematuria.   Musculoskeletal: Negative for arthralgias.  Skin: Negative for itching and rash.  Neurological: Negative for extremity weakness.  Hematological: Negative for adenopathy.  Psychiatric/Behavioral: Negative for confusion.    MEDICAL HISTORY: Past Medical History:  Diagnosis Date  .  Dysrhythmia   . Genetic testing 03/28/2017   Multi-Cancer panel (83 genes) @ [facility] - No pathogenic mutations detected  . High grade ovarian cancer ([place]) 11/20/2016  . Pelvic mass in female     SURGICAL HISTORY: Past Surgical History:  Procedure Laterality Date  . APPENDECTOMY    . LAPAROSCOPY N/A 03/14/2017   Procedure: LAPAROSCOPY OPERATIVE;  Surgeon: Mellody Drown, MD;  Location: [facility];  Service: Gynecology;  Laterality: N/A;  . LAPAROTOMY N/A 03/14/2017   Procedure: LAPAROTOMY;  Surgeon: Mellody Drown, MD;  Location: [facility];  Service: Gynecology;  Laterality: N/A;  . LYMPH NODE DISSECTION N/A 03/14/2017   Procedure: LYMPH NODE DISSECTION;  Surgeon: Mellody Drown, MD;  Location: [facility];  Service: Gynecology;  Laterality: N/A;  . OMENTECTOMY N/A 03/14/2017   Procedure: OMENTECTOMY;  Surgeon: Mellody Drown, MD;  Location: [facility];  Service: Gynecology;  Laterality: N/A;  . PORTA CATH INSERTION N/A 11/27/2016   Procedure: Glori Luis Cath Insertion;  Surgeon: Algernon Huxley, MD;  Location: [facility];  Service: Cardiovascular;  Laterality: N/A;    SOCIAL HISTORY: Social History   Socioeconomic History  . Marital status: Married    Spouse name: Not on file  . Number of children: Not on file  . Years of education: Not on file  . Highest education level: Not on file  Occupational History  . Not on file  Tobacco Use  . Smoking status: Never Smoker  . Smokeless tobacco: Never Used  Substance and Sexual Activity  . Alcohol use: No    Comment: use to be occ. but not any since 5 months  . Drug use: No  . Sexual activity: Yes    Birth control/protection: Post-menopausal  Other Topics Concern  . Not on file  Social History Narrative  . Not on file   Social Determinants of Health   Financial Resource Strain:   . Difficulty of Paying Living Expenses:   Food Insecurity:   . Worried About Paediatric nurse in the Last Year:   . Arboriculturist in the Last Year:   Transportation Needs:   . Film/video editor (Medical):   [name] Lack of Transportation (Non-Medical):   Physical Activity:   . Days of Exercise per Week:   . Minutes of Exercise per Session:   Stress:   . Feeling of Stress :   Social Connections:   . Frequency of Communication with Friends and Family:   . Frequency of Social Gatherings with Friends and Family:   . Attends Religious Services:   . Active Member of Clubs or Organizations:   . Attends Archivist Meetings:   [name] Marital Status:   Intimate Partner Violence:   . Fear of Current or Ex-Partner:   . Emotionally Abused:   [name] Physically Abused:   . Sexually Abused:     FAMILY HISTORY Family History  Problem Relation Age of Onset  . Throat cancer Cousin   . Throat cancer Cousin   . Leukemia Cousin     ALLERGIES:  is allergic to omeprazole.  MEDICATIONS:  Current Outpatient Medications  Medication Sig Dispense Refill  . lidocaine-prilocaine (EMLA) cream Apply 1 application topically as needed. Apply small amount to port site at least 1 hour prior to it being accessed, cover with plastic wrap 30 g 1  . LYNPARZA 150 MG tablet TAKE TWO TABLETS BY MOUTH TWICE A DAY 120 tablet 2  . vitamin B-12 (CYANOCOBALAMIN) 1000 MCG tablet Take  1 tablet (1,000 mcg total) by mouth daily. 90 tablet 4   No current facility-administered medications for this visit.    PHYSICAL EXAMINATION:  ECOG PERFORMANCE STATUS: 0 - Asymptomatic Vitals:   08/06/19 1036  BP: 99/61  Pulse: 80  Resp: 18  Temp: (!) 97.4 F (36.3 C)    Filed Weights   08/06/19 1036  Weight: 123 lb 4.8 oz (55.9 kg)     Physical Exam  Constitutional: She is oriented to person, place, and time. No distress.  Thin built, she walks independantly  HENT:  Head: Normocephalic and atraumatic.  Nose: Nose normal.  Mouth/Throat: Oropharynx is clear and moist. No oropharyngeal exudate.   Eyes: Pupils are equal, round, and reactive to light. EOM are normal. No scleral icterus.  Neck: No JVD present.  Cardiovascular: Normal rate and regular rhythm.  No murmur heard. Pulmonary/Chest: Effort normal and breath sounds normal. No respiratory distress. She has no rales. She exhibits no tenderness.  Abdominal: Soft. Bowel sounds are normal. She exhibits no distension. There is no abdominal tenderness.  Musculoskeletal:        General: No edema. Normal range of motion.     Cervical back: Normal range of motion and neck supple.  Lymphadenopathy:    She has no cervical adenopathy.  Neurological: She is alert and oriented to person, place, and time. No cranial nerve deficit. She exhibits normal muscle tone. Coordination normal.  Skin: Skin is warm and dry. She is not diaphoretic. No erythema.  Right anterior medi port +   Psychiatric: Affect and judgment normal.     LABORATORY DATA: I have personally reviewed the data as listed: CBC    Component Value Date/Time   WBC 3.1 (L) 08/04/2019 1109   RBC 2.77 (L) 08/04/2019 1109   HGB 11.2 (L) 08/04/2019 1109   HCT 31.7 (L) 08/04/2019 1109   PLT 125 (L) 08/04/2019 1109   MCV 114.4 (H) 08/04/2019 1109   MCH 40.4 (H) 08/04/2019 1109   MCHC 35.3 08/04/2019 1109   RDW 13.9 08/04/2019 1109   LYMPHSABS 1.0 08/04/2019 1109   MONOABS 0.3 08/04/2019 1109   EOSABS 0.1 08/04/2019 1109   BASOSABS 0.0 08/04/2019 1109   CMP Latest Ref Rng & Units 08/04/2019 07/02/2019 06/10/2019  Glucose 70 - 99 mg/dL 100(H) 84 97  BUN 8 - 23 mg/dL _0 Creatinine 0.44 - 1.00 mg/dL 0.75 0.72 0.86  Sodium 135 - 145 mmol/L 133(L) 133(L) 133(L)  Potassium 3.5 - 5.1 mmol/L 4.0 3.7 4.4  Chloride 98 - 111 mmol/L 100 102 102  CO2 22 - 32 mmol/L _1 Calcium 8.9 - 10.3 mg/dL 9.8 9.5 9.9  Total Protein 6.5 - 8.1 g/dL 7.5 7.0 7.0  Total Bilirubin 0.3 - 1.2 mg/dL 0.8 0.8 0.7  Alkaline Phos 38 - 126 U/L 60 59 56  AST 15 - 41 U/L 15 14(L) 14(L)  ALT 0 - 44  U/L _2 Pathology 11/16/2016 Surgical Pathology  CASE: [id number]  PATIENT: Katie Hill  Surgical Pathology Report   SPECIMEN SUBMITTED:  A. Retroperitoneal adenopathy, left  DIAGNOSIS:  A. LYMPH NODE, LEFT RETROPERITONEAL; CT-GUIDED CORE BIOPSY:  - METASTATIC HIGH-GRADE SEROUS CARCINOMA.  Pathology 03/14/2017   DIAGNOSIS:  A. OMENTUM; OMENTECTOMY:  - NO TUMOR SEEN.  - ONE NEGATIVE LYMPH NODE (0/1).   B. RIGHT FALLOPIAN TUBE AND OVARY; SALPINGO-OOPHORECTOMY:  - SMALL FOCI OF HIGH GRADE SEROUS CARCINOMA INVOLVING THE OVARY.  - MARKED THERAPY RELATED  CHANGE.  - NO TUMOR SEEN IN THE FALLOPIAN TUBE.   C. UTERUS, CERVIX, LEFT FALLOPIAN TUBE AND OVARY; HYSTERECTOMY AND LEFT  SALPINGO-OOPHORECTOMY:  - NABOTHIAN CYSTS.  - CYSTIC ATROPHY OF THE ENDOMETRIUM.  - SEROSAL ADHESIONS.  - UNREMARKABLE FALLOPIAN TUBE.  - OVARY SHOWING TREATMENT RELATED CHANGE.   D. 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    RADIOGRAPHIC STUDIES: I have personally reviewed the radiological images as listed and agreed with the findings in the report. CT Chest W Contrast  Result Date: 06/26/2019 CLINICAL DATA:  69 year old female with history of ovarian cancer. Follow-up study. EXAM: CT CHEST, ABDOMEN, AND PELVIS WITH CONTRAST TECHNIQUE: Multidetector CT imaging of the chest, abdomen and pelvis was performed following the standard protocol during bolus administration of intravenous contrast. CONTRAST:  65m OMNIPAQUE IOHEXOL 300 MG/ML  SOLN COMPARISON:  CT the abdomen and pelvis 04/28/2019. CT the chest, abdomen and pelvis 08/28/2018. FINDINGS: CT CHEST FINDINGS Cardiovascular: Heart size is normal. There is no significant pericardial fluid, thickening or pericardial calcification. Aortic atherosclerosis. No definite coronary artery calcifications. Right internal jugular single-lumen porta cath with tip terminating in  the distal superior vena cava. Mediastinum/Nodes: No pathologically enlarged mediastinal or hilar lymph nodes. Esophagus is unremarkable in appearance. No axillary lymphadenopathy. Lungs/Pleura: Multiple small pulmonary nodules are again noted in the lungs bilaterally, stable in size and number compared to the prior study from 08/28/2018, largest of which measures 6 mm in the left lower lobe (axial image 93 of series 4). No other suspicious appearing pulmonary nodules or masses are noted. No acute consolidative airspace disease. No pleural effusions. Musculoskeletal: There are no aggressive appearing lytic or blastic lesions noted in the visualized portions of the skeleton. CT ABDOMEN PELVIS FINDINGS Hepatobiliary: No suspicious cystic or solid hepatic lesions. No intra or extrahepatic biliary ductal dilatation. Gallbladder is normal in appearance. Pancreas: No pancreatic mass. No pancreatic ductal dilatation. No pancreatic or peripancreatic fluid collections or inflammatory changes. Spleen: Unremarkable. Adrenals/Urinary Tract: Bilateral kidneys and adrenal glands are normal in appearance. No hydroureteronephrosis. Urinary bladder is normal in appearance. Stomach/Bowel: Normal appearance of the stomach. No pathologic dilatation of small bowel or colon. The appendix is not confidently identified and may be surgically absent. Regardless, there are no inflammatory changes noted adjacent to the cecum to suggest the presence of an acute appendicitis at this time. Vascular/Lymphatic: Aortic atherosclerosis, without evidence of aneurysm or dissection in the abdominal or pelvic vasculature. Enlarged retrocaval lymph nodes measuring up to 2 cm in short axis (axial image 73 of series 2), very similar to the prior examination. Adjacent aortocaval lymph node measuring 1.6 cm in short axis (axial image 71 of series 2) increased from 1.3 cm in short axis on the prior. Reproductive: Status post total abdominal hysterectomy and  bilateral salpingo-oophorectomy. Persistent small 3.6 x 1.7 cm intermediate attenuation (35 HU) collection of material in the posterior aspect of the anatomic pelvis (axial image 107 of series 2), stable compared to the prior study. Other: No significant volume of ascites.  No pneumoperitoneum. Musculoskeletal: There are no aggressive appearing lytic or blastic lesions noted in the visualized portions of the skeleton. IMPRESSION: 1. Previously noted retrocaval lymph node is stable, while the adjacent aortocaval lymph node has slightly increased in size compared to the prior examination. Previously noted soft tissue attenuation lesion in the posterior aspect of the anatomic pelvis appear stable compared to the prior study. No other new sites of metastatic disease are  noted elsewhere in the chest, abdomen or pelvis. 2. Previously noted small pulmonary nodules are all stable in number and size compared to the prior examinations, favored to be benign. 3. Aortic atherosclerosis. Electronically Signed   By: Vinnie Langton M.D.   On: 06/26/2019 11:26   CT Abdomen Pelvis W Contrast  Result Date: 06/26/2019 CLINICAL DATA:  69 year old female with history of ovarian cancer. Follow-up study. EXAM: CT CHEST, ABDOMEN, AND PELVIS WITH CONTRAST TECHNIQUE: Multidetector CT imaging of the chest, abdomen and pelvis was performed following the standard protocol during bolus administration of intravenous contrast. CONTRAST:  41m OMNIPAQUE IOHEXOL 300 MG/ML  SOLN COMPARISON:  CT the abdomen and pelvis 04/28/2019. CT the chest, abdomen and pelvis 08/28/2018. FINDINGS: CT CHEST FINDINGS Cardiovascular: Heart size is normal. There is no significant pericardial fluid, thickening or pericardial calcification. Aortic atherosclerosis. No definite coronary artery calcifications. Right internal jugular single-lumen porta cath with tip terminating in the distal superior vena cava. Mediastinum/Nodes: No pathologically enlarged mediastinal  or hilar lymph nodes. Esophagus is unremarkable in appearance. No axillary lymphadenopathy. Lungs/Pleura: Multiple small pulmonary nodules are again noted in the lungs bilaterally, stable in size and number compared to the prior study from 08/28/2018, largest of which measures 6 mm in the left lower lobe (axial image 93 of series 4). No other suspicious appearing pulmonary nodules or masses are noted. No acute consolidative airspace disease. No pleural effusions. Musculoskeletal: There are no aggressive appearing lytic or blastic lesions noted in the visualized portions of the skeleton. CT ABDOMEN PELVIS FINDINGS Hepatobiliary: No suspicious cystic or solid hepatic lesions. No intra or extrahepatic biliary ductal dilatation. Gallbladder is normal in appearance. Pancreas: No pancreatic mass. No pancreatic ductal dilatation. No pancreatic or peripancreatic fluid collections or inflammatory changes. Spleen: Unremarkable. Adrenals/Urinary Tract: Bilateral kidneys and adrenal glands are normal in appearance. No hydroureteronephrosis. Urinary bladder is normal in appearance. Stomach/Bowel: Normal appearance of the stomach. No pathologic dilatation of small bowel or colon. The appendix is not confidently identified and may be surgically absent. Regardless, there are no inflammatory changes noted adjacent to the cecum to suggest the presence of an acute appendicitis at this time. Vascular/Lymphatic: Aortic atherosclerosis, without evidence of aneurysm or dissection in the abdominal or pelvic vasculature. Enlarged retrocaval lymph nodes measuring up to 2 cm in short axis (axial image 73 of series 2), very similar to the prior examination. Adjacent aortocaval lymph node measuring 1.6 cm in short axis (axial image 71 of series 2) increased from 1.3 cm in short axis on the prior. Reproductive: Status post total abdominal hysterectomy and bilateral salpingo-oophorectomy. Persistent small 3.6 x 1.7 cm intermediate attenuation (35  HU) collection of material in the posterior aspect of the anatomic pelvis (axial image 107 of series 2), stable compared to the prior study. Other: No significant volume of ascites.  No pneumoperitoneum. Musculoskeletal: There are no aggressive appearing lytic or blastic lesions noted in the visualized portions of the skeleton. IMPRESSION: 1. Previously noted retrocaval lymph node is stable, while the adjacent aortocaval lymph node has slightly increased in size compared to the prior examination. Previously noted soft tissue attenuation lesion in the posterior aspect of the anatomic pelvis appear stable compared to the prior study. No other new sites of metastatic disease are noted elsewhere in the chest, abdomen or pelvis. 2. Previously noted small pulmonary nodules are all stable in number and size compared to the prior examinations, favored to be benign. 3. Aortic atherosclerosis. Electronically Signed   By: DMauri BrooklynD.  On: 06/26/2019 11:26    ASSESSMENT/PLAN Cancer Staging Malignant neoplasm of ovary [facility]) Staging form: Ovary, Fallopian Tube, and Primary Peritoneal Carcinoma, AJCC 8th Edition - Clinical stage from 11/22/2016: Stage IIIC (cT3c, cN1b, cM0) - Signed by Earlie Server, MD on 11/22/2016  1. High grade ovarian cancer ([place])   2. Thrombocytopenia ([employer])   3. Anemia associated with chemotherapy   4. Port-A-Cath in place   5. Encounter for antineoplastic chemotherapy   : # Ovarian Cancer, local recurrence.  Currently on olaparib 300 mg twice daily. She tolerates well.  CA 125 18.7-->61.2-->111-->90.5-->69--> 54.8--> 29.1-->47-->71.2-->67.7->73.2-->82.8 Labs reviewed and discussed with patient. CA-125 continues to trend up. Reviewed recent  CTs again with patient.   CT 06/26/2019 Previously noted retrocaval lymph node is stable, adjacent aorta caval lymph node has slightly increasing size compared to prior examination.  Soft tissue attenuation lesion in the posterior aspect of the  anastomic pelvis.  Stable.  No other new sites of metastatic disease.  Small lung nodules are stable. Patient is having slow progression.  Continue follow-up closely. Recommend patient to continue olaparib 300 mg twice daily. She tolerates well. Follow-up CA-125 monthly.  Chronic anemia, hemoglobin is stable. Macrocytosis due to olaparib. Thrombocytopenia, grade 1, stable. Port flush every 6 to 8 weeks. RTF 4 weeks  Orders Placed This Encounter  Procedures  . CBC with Differential/Platelet    Standing Status:   Future    Standing Expiration Date:   08/05/2020  . Comprehensive metabolic panel    Standing Status:   Future    Standing Expiration Date:   08/05/2020  . CA 125    Standing Status:   Future    Standing Expiration Date:   08/05/2020    Earlie Server, MD, PhD Hematology Oncology [facility] at [facility] Pager- [phone] 08/06/19

## 2019-08-06 NOTE — Progress Notes (Signed)
Patient does not offer any problems today.  

## 2019-08-20 ENCOUNTER — Inpatient Hospital Stay: Payer: Medicare Other | Attending: Obstetrics and Gynecology | Admitting: Obstetrics and Gynecology

## 2019-08-20 ENCOUNTER — Other Ambulatory Visit: Payer: Self-pay

## 2019-08-20 VITALS — BP 112/59 | HR 62 | Temp 96.8°F | Resp 16 | Wt 123.0 lb

## 2019-08-20 DIAGNOSIS — Z90710 Acquired absence of both cervix and uterus: Secondary | ICD-10-CM | POA: Diagnosis not present

## 2019-08-20 DIAGNOSIS — C569 Malignant neoplasm of unspecified ovary: Secondary | ICD-10-CM | POA: Insufficient documentation

## 2019-08-20 DIAGNOSIS — D61810 Antineoplastic chemotherapy induced pancytopenia: Secondary | ICD-10-CM | POA: Insufficient documentation

## 2019-08-20 DIAGNOSIS — R971 Elevated cancer antigen 125 [CA 125]: Secondary | ICD-10-CM | POA: Diagnosis not present

## 2019-08-20 DIAGNOSIS — Z79899 Other long term (current) drug therapy: Secondary | ICD-10-CM

## 2019-08-20 DIAGNOSIS — Z90722 Acquired absence of ovaries, bilateral: Secondary | ICD-10-CM | POA: Diagnosis not present

## 2019-08-20 NOTE — Progress Notes (Signed)
Pt states that she has bloating at times especially with BM's.

## 2019-08-20 NOTE — Progress Notes (Signed)
Gynecologic Oncology Interval Visit   Referring Provider: Dr. 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 G0P0 female with locally recurrent advanced ovarian cancer, currently on olaparib, who presents to clinic for follow up.   She was diagnosed with high grade serous ovarian cancer (HRD) on CT directed node biopsy with partial bowel obstruction and extensive bulky adenopathy on 11/2016 and received 4 cycles of carbo-taxol with dramatic decline in CA 125 and improvement in imaging. She underwent interval debulking surgery to no gross residual disease on 03/14/2017 followed by re-initiation of chemotherapy on 03/28/17 with normalization of CA-125. She had a platinum sensitive recurrence and received carbo-taxol recurred and received carbo-taxol 03/15/2018-04/26/2018 followed by olaparib since 2/20 for somatic HRD positive malignancy. Initially CA 125 rose, then normalized.   In the interim, she was felt to have radiologic progression. Case was discussed at [facility] and recommendation was to continue parp inhibitor as she continued to be asymptomatic. We had discussed other options for treatment should she have clinical progression.    CA125 continues to rise slowly. No significant symptoms.   CA125 07/25/19 82  3/21 76 2/21 67 04/14/19 47.0 02/12/20 29.1 12/18/2018 54.8  05/08/2019 CT C/A/P IMPRESSION: 1. Mild progression of metastatic disease. Mildly enlarged aortocaval lymph node is newly enlarged since 11/05/2018 MRI and 08/28/2018 CT studies. Separate enlarged retrocaval lymph node is stable since 11/05/2018 MRI and increased since 08/28/2018 CT. 2. Stable posterior right pelvic soft tissue focus, characterized as probable chronic postsurgical collection on prior MRI. 3.  Aortic Atherosclerosis (ICD10-I70.0).  Reviewed imaging 08/2018; 10/2018; 04/2019; Measurement vary on imaging reads. She does not have a  baseline CT scan from 05/2018 1.Enlarged retrocaval lymph node measures 2.2 cm short axis diameter (series 2/image 29), compared to 2.2 cm 11/05/2018 MRI using similar measuring technique and 1.5 cm on 08/28/2018 CT. 2. Posterior pelvic 3.2 x 1.9 cm soft tissue focus just to the right of midline (series 2/image 64), previously 3.5 x 1.6 cm on 08/28/2018 CT. 3.3 x 1.6 cm  On MRI 10/2018. 3. New enlarged PA node 1.5 cm (series 2/image 29)  CT 3/21  IMPRESSION: 1. Previously noted retrocaval lymph node is stable, while the adjacent aortocaval lymph node has slightly increased in size compared to the prior examination. Previously noted soft tissue attenuation lesion in the posterior aspect of the anatomic pelvis appear stable compared to the prior study. No other new sites of metastatic disease are noted elsewhere in the chest, abdomen or pelvis. 2. Previously noted small pulmonary nodules are all stable in number and size compared to the prior examinations, favored to be benign. 3. Aortic atherosclerosis.  Oncology History: Arina Torry is a pleasant. G0P0 female with advanced ovarian cancer.  She presented to the ER 11/13/2016 with symptoms of right lower quadrant pain for 3-4 weeks, associated with nausea, bloating, no change in weight, urinary frequency, and change in bowel habits with more diffucult and stringy BM's.  CT CHEST, ABDOMEN, AND PELVIS WITH CONTRAST IMPRESSION: 1. Large heterogeneous central pelvic mass measures up to 12.6 cm. The lesion generates substantial mass-effect on the uterus, bladder, and pelvic sidewall anatomy. The mass appears to invade the mid sigmoid colon. Etiology of the central pelvic mass is not definitive by CT, but ovarian primary is suspected. The lesion becomes indistinguishable from the posterior uterus on some images and uterine origin is possible, but the uterus does not appear to be  the epicenter of the mass and appears to be more displaced by it. MRI of  the pelvis without and with contrast may prove helpful to better delineate the relationship of the uterus to the central pelvic mass although it may not be able to definitively localize the origin. 2. Bulky retroperitoneal and left pelvic sidewall lymphadenopathy. The retroperitoneal lymphadenopathy generates substantial mass-effect on the IVC and left renal vein. The external iliac veins along each pelvic sidewall are markedly attenuated by the mass/lymphadenopathy. Patency of these vessels cannot be definitely confirmed on this exam.  11/16/16 LYMPH NODE, LEFT RETROPERITONEAL; CT-GUIDED CORE BIOPSY:  - METASTATIC HIGH-GRADE SEROUS CARCINOMA.   Decision was made to do neoadjuvant carbo/taxol chemotherapy.  CA125 fell from 4,382 to 64.6 (10/29).  CT scan showed dramatic response.  CT scan 10/14 Vascular/Lymphatic: Normal appearance of the abdominal aorta. Interval decrease in size of previously identified bulky retroperitoneal adenopathy. Index aortocaval node measures 1.6 x 2.6 cm, image 32 of series 2. Previously 4.4 x 5.3 cm. Index left periaortic nodal mass measures 2.2 x 1.3 cm, image 31 of series 2. Previously 3.7 x 4.7 cm. At the level of the bifurcation there is a low-attenuation nodal mass which measure 3.1 x 2.6 cm, image 44 of series 2. Previously 4.3 x 4.8 cm. Left common iliac node measures 1.2 cm, image 53 of series 2. Previously 2.2 cm. Left external iliac node measures 1.6 x 1.1 cm, image 67 of series 2. Previously 4.8 x 2.6 cm. Reproductive: Large mass centered around the uterus measures 9.4 x 5.5 cm, image 67 of series 2. Previously this measured 12.6 x 9.2 cm  On 03/14/2017 she underwent L/S converted to XL, TAH, BSO, right PA node resection, omentectomy, and LOA to no residual disease.   Pathology 03/14/2017  DIAGNOSIS:  A. OMENTUM; OMENTECTOMY:  - NO TUMOR SEEN.  - ONE NEGATIVE LYMPH NODE (0/1).   B. RIGHT FALLOPIAN TUBE AND OVARY; SALPINGO-OOPHORECTOMY:  - SMALL FOCI OF  HIGH GRADE SEROUS CARCINOMA INVOLVING THE OVARY.  - MARKED THERAPY RELATED CHANGE.  - NO TUMOR SEEN IN THE FALLOPIAN TUBE.   C. UTERUS, CERVIX, LEFT FALLOPIAN TUBE AND OVARY; HYSTERECTOMY AND LEFT  SALPINGO-OOPHORECTOMY:  - NABOTHIAN CYSTS.  - CYSTIC ATROPHY OF THE ENDOMETRIUM.  - SEROSAL ADHESIONS.  - UNREMARKABLE FALLOPIAN TUBE.  - OVARY SHOWING TREATMENT RELATED CHANGE.   D. 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   Then completed an additional 3 cycles of carbo/Taxol and CA 125 was 7.4 05/09/17.  CA 125  14 in 5/19  21 in 8/19.   CT scan 11/22/17 IMPRESSION: 1. Interval debulking surgery with decrease in retroperitoneal and extraperitoneal lymphadenopathy. In some areas the lymphadenopathy has resolved completely. Peritoneal implant seen along the falciform ligament on the prior study has decreased. 2. 4 x 2 cm collection of complex fluid or soft tissue in the right cul-de-sac is in the region of the complex mass lesion seen on the previous study. Given that there is some apparent enhancement peripherally, residual/recurrent disease is a concern and close attention will be required. 3. Stable tiny bilateral pulmonary nodules with no pleural effusion.  CA125  25.7 12/24/17 46.3 02/18/18  02/28/2018- CT C/A/P  1. Compared to the prior examination there has been interval enlargement of an aortocaval lymph node which currently measures 1.4 cm in short axis (axial image 75 of series 2). This lymph node is in close proximity to the previously resected retroperitoneal lymphadenopathy and is  very concerning for an additional nodal metastasis. 2. Other findings in the chest, abdomen and pelvis appear very similar to prior examinations, as discussed above. 3. Aortic atherosclerosis.  She received 3 cycles of carbo-Taxol 03/15/2018-04/26/2018. CA 125 improved to 6.0. She started olaparib 300 mg BID 2/20 for  platinum-sensitive recurrent ovarian cancer (HRD positive).   CA125 04/05/2018    15.3 04/26/2018       6.3 05/17/2018         6.0 06/07/2018        6.9 06/21/2018 7.0 07/05/2018 8.4 08/02/2018 18.7 08/29/2018 61.2 (H) 09/23/2018  111.0 (H) 10/21/2018 90.5 (H)    11/04/18 MRI Abd/Pelvis IMPRESSION: 1. Interval progression of a retrocaval lymph node in the abdomen, now measuring 2.7 x 2.3 cm and concerning for progression of metastatic disease. 2. Aortocaval lymph node seen as decreased on the prior study is stable. This is unenlarged by MR criteria. 3. 3.3 x 1.6 cm homogeneous lesion posterior right pelvis is smaller than 02/28/2018. This is homogeneous and shows thin, smooth peripheral enhancement with no evidence for central enhancement and likely represents a chronic postoperative collection although metastatic disease not entirely excluded.  She has continued on olaparib (she only stopped for about a 1/2 a day) and her CA125 values have declined. She presents for a pelvic exam today. She saw Dr. Tasia Catchings on 02/12/2019. She had chronic anemia and grade 1 thrombocytopenia, secondary to olaparib. She is otherwise tolerating treatment very well.     Genetic testing negative for 83 genes on [facility]'s Multi-Cancer panel (ALK, APC, ATM, AXIN2, BAP1, BARD1, BLM, BMPR1A, BRCA1, BRCA2, BRIP1, CASR, CDC73, CDH1, CDK4, CDKN1B, CDKN1C, CDKN2A, CEBPA, CHEK2, CTNNA1, DICER1, DIS3L2, EGFR, EPCAM, FH, FLCN, GATA2, GPC3, GREM1, HOXB13, HRAS, KIT, MAX, MEN1, MET, MITF, MLH1, MSH2, MSH3, MSH6, MUTYH, NBN, NF1, NF2, NTHL1, PALB2, PDGFRA, PHOX2B, PMS2, POLD1, POLE, POT1, PRKAR1A, PTCH1, PTEN, RAD50, RAD51C, RAD51D, RB1, RECQL4, RET, RUNX1, SDHA, SDHAF2, SDHB, SDHC, SDHD, SMAD4, SMARCA4, SMARCB1, SMARCE1, STK11, SUFU, TERC, TERT, TMEM127, TP53, TSC1, TSC2, VHL, WRN, WT1).  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 This is still considered a normal result.   Somatic tumor testing:  She  has HRD positive cancer, negative for somatic or germline BRCA mutation.   Patient Active Problem List   Diagnosis Date Noted  . Encounter for antineoplastic chemotherapy 04/14/2019  . Goals of care, counseling/discussion 03/09/2018  . Genetic testing 03/28/2017  . S/P total abdominal hysterectomy and bilateral salpingo-oophorectomy 03/14/2017  . Hyponatremia 12/12/2016  . Dehydration 12/08/2016  . Malignant neoplasm of ovary ([place]) 11/20/2016  . Pelvic mass 11/15/2016   Past Medical History:  Diagnosis Date  . Dysrhythmia   . Genetic testing 03/28/2017   Multi-Cancer panel (83 genes) @ [facility] - No pathogenic mutations detected  . High grade ovarian cancer ([place]) 11/20/2016  . Pelvic mass in female    Past Surgical History:  Procedure Laterality Date  . APPENDECTOMY    . LAPAROSCOPY N/A 03/14/2017   Procedure: LAPAROSCOPY OPERATIVE;  Surgeon: Mellody Drown, MD;  Location: [facility];  Service: Gynecology;  Laterality: N/A;  . LAPAROTOMY N/A 03/14/2017   Procedure: LAPAROTOMY;  Surgeon: Mellody Drown, MD;  Location: [facility];  Service: Gynecology;  Laterality: N/A;  . LYMPH NODE DISSECTION N/A 03/14/2017   Procedure: LYMPH NODE DISSECTION;  Surgeon: Mellody Drown, MD;  Location: [facility];  Service: Gynecology;  Laterality: N/A;  . OMENTECTOMY N/A 03/14/2017   Procedure: OMENTECTOMY;  Surgeon: Mellody Drown, MD;  Location: [facility];  Service: Gynecology;  Laterality: N/A;  . PORTA CATH INSERTION N/A 11/27/2016   Procedure: Glori Luis Cath Insertion;  Surgeon: Algernon Huxley, MD;  Location: [place] [facility];  Service: Cardiovascular;  Laterality: N/A;   Past Gynecologic History:  Menarche: 17 Last Menstrual Period: 24 years ago History of Abnormal pap: no Last pap: years ago She does not have regular medical cancer and has not has screening mammogram, colonoscopy, or Pap smears. She has not had an abnormal Pap.   OB History    Gravida  0   Para  0   Term  0   Preterm  0    AB  0   Living  0     SAB  0   TAB  0   Ectopic  0   Multiple  0   Live Births  0          Family History  Problem Relation Age of Onset  . Throat cancer Cousin   . Throat cancer Cousin   . Leukemia Cousin    Social History   Socioeconomic History  . Marital status: Married    Spouse name: Not on file  . Number of children: Not on file  . Years of education: Not on file  . Highest education level: Not on file  Occupational History  . Not on file  Tobacco Use  . Smoking status: Never Smoker  . Smokeless tobacco: Never Used  Substance and Sexual Activity  . Alcohol use: Not Currently  . Drug use: No  . Sexual activity: Yes    Birth control/protection: Post-menopausal  Other Topics Concern  . Not on file  Social History Narrative  . Not on file   Social Determinants of Health   Financial Resource Strain:   . Difficulty of Paying Living Expenses:   Food Insecurity:   . Worried About Charity fundraiser in the Last Year:   . Arboriculturist in the Last Year:   Transportation Needs:   . Film/video editor (Medical):   [name] Lack of Transportation (Non-Medical):   Physical Activity:   . Days of Exercise per Week:   . Minutes of Exercise per Session:   Stress:   . Feeling of Stress :   Social Connections:   . Frequency of Communication with Friends and Family:   . Frequency of Social Gatherings with Friends and Family:   . Attends Religious Services:   . Active Member of Clubs or Organizations:   . Attends Archivist Meetings:   [name] Marital Status:    Allergies  Allergen Reactions  . Omeprazole Rash   Current Outpatient Medications on File Prior to Visit  Medication Sig Dispense Refill  . lidocaine-prilocaine (EMLA) cream Apply 1 application topically as needed. Apply small amount to port site at least 1 hour prior to it being accessed, cover with plastic wrap 30 g 1  . LYNPARZA 150 MG tablet TAKE TWO TABLETS BY MOUTH TWICE A DAY 120  tablet 2  . vitamin B-12 (CYANOCOBALAMIN) 1000 MCG tablet Take 1 tablet (1,000 mcg total) by mouth daily. 90 tablet 4  . [DISCONTINUED] olaparib (LYNPARZA) 150 MG tablet Take 2 tablets (300 mg total) by mouth 2 (two) times daily. 120 tablet 2   No current facility-administered medications on file prior to visit.   Review of Systems General:  no complaints Skin: no complaints Eyes: no complaints HEENT: no complaints Breasts: no complaints Pulmonary: no complaints Cardiac: no complaints  Gastrointestinal: no complaints Genitourinary/Sexual: no complaints Ob/Gyn: no complaints Musculoskeletal: no complaints Hematology: no complaints Neurologic/Psych: no complaints   Objective:  Physical Examination:  Today's Vitals   08/20/19 1033  BP: (!) 112/59  Pulse: 62  Resp: 16  Temp: (!) 96.8 F (36 C)  TempSrc: Tympanic  Weight: 123 lb (55.8 kg)  PainSc: 0-No pain   Body mass index is 18.43 kg/m.  ECOG Performance Status: 1 - Symptomatic but completely ambulatory  GENERAL: Patient is a well appearing female in no acute distress HEENT:  Sclera clear. Anicteric NODES:  Negative axillary, supraclavicular, inguinal lymph node survery LUNGS:  Clear to auscultation bilaterally.   HEART:  Regular rate and rhythm.  ABDOMEN:  Soft, nontender.  No hernias, incisions well healed. No masses or ascites EXTREMITIES:  No peripheral edema. Atraumatic. No cyanosis SKIN:  Clear with no obvious rashes or skin changes.  NEURO:  Nonfocal. Well oriented.  Appropriate affect.  Pelvic: EGBUS: no lesions Cervix: surgically absent Vagina: no lesions, no discharge or bleeding Uterus: surgically absent Adnexa: no palpable masses Rectovaginal: performed and confirmatory. No pelvic masses palpated.   Lab Results  Component Value Date   WBC 3.1 (L) 08/04/2019   HGB 11.2 (L) 08/04/2019   HCT 31.7 (L) 08/04/2019   MCV 114.4 (H) 08/04/2019   PLT 125 (L) 08/04/2019     Assessment:  Felesha Moncrieffe is a 69 y.o. female diagnosed with high grade serous ovarian cancer (HRD) on CT directed node biopsy with partial bowel obstruction and extensive bulky adenopathy 8/18. s/p 4 cycles of carbo/taxol chemotherapy with dramatic decline in CA125 and improvement on CT scan. Interval debulking surgery to no gross residual on 03/14/2017.  Reinitiation of chemotherapy on 03/28/2017 with normalization of CA125.  Platinum-sensitive recurrence with normalization with re-induction of carbo-Taxol 03/15/2018-04/26/2018. On olaparib since 2/20 for HRD positive cancer, with initial CA125 rise and then normalization CA125. Now with slowly rising CA125 on Olaparib with mild pancytopenia. CT scan 3/21 without clear evidence of progression, aortocaval lymph node which currently measures 1.4 cm in short axis is slightly larger.   She does not have a germline mutation in BRCA1/2 or other related gene, but somatic tumor testing is positive for HRD.     Medical co-morbidities complicating care: prior abdominal surgery.  Plan:   Problem List Items Addressed This Visit      Endocrine   Malignant neoplasm of ovary ([place]) - Primary     She will continue to see Dr. Tasia Catchings for evaluation on Olaparib. She is tolerating therapy well. CA125 trend and CT concerning for mild progressive disease, but not having significant symptoms.   Return to [place] clinic in 3 months.   If she has more significant progression of disease she would be treated with other FDA approved drugs including Doxil, gemcitabine and Avastin.  Also could be a candidate for clinical trial.  A total of over 25 minutes were spent with the patient/family today; >50% was spent in education, counseling and coordination of care for platinum-sensitive  high grade serous ovarian cancer (HRD) cancer.   Mellody Drown, MD   CC:  Gae Dry,  MD [address] [place], [place] [address]  Dr. Tasia Catchings

## 2019-08-27 ENCOUNTER — Ambulatory Visit: Payer: Medicare Other

## 2019-09-03 ENCOUNTER — Other Ambulatory Visit: Payer: Self-pay

## 2019-09-03 ENCOUNTER — Inpatient Hospital Stay: Payer: Medicare Other

## 2019-09-03 DIAGNOSIS — D61810 Antineoplastic chemotherapy induced pancytopenia: Secondary | ICD-10-CM | POA: Diagnosis not present

## 2019-09-03 DIAGNOSIS — Z79899 Other long term (current) drug therapy: Secondary | ICD-10-CM | POA: Diagnosis not present

## 2019-09-03 DIAGNOSIS — R971 Elevated cancer antigen 125 [CA 125]: Secondary | ICD-10-CM | POA: Diagnosis not present

## 2019-09-03 DIAGNOSIS — C569 Malignant neoplasm of unspecified ovary: Secondary | ICD-10-CM

## 2019-09-03 DIAGNOSIS — Z90722 Acquired absence of ovaries, bilateral: Secondary | ICD-10-CM | POA: Diagnosis not present

## 2019-09-03 DIAGNOSIS — Z95828 Presence of other vascular implants and grafts: Secondary | ICD-10-CM

## 2019-09-03 DIAGNOSIS — Z90710 Acquired absence of both cervix and uterus: Secondary | ICD-10-CM | POA: Diagnosis not present

## 2019-09-03 LAB — CBC WITH DIFFERENTIAL/PLATELET
Abs Immature Granulocytes: 0.03 10*3/uL (ref 0.00–0.07)
Basophils Absolute: 0 10*3/uL (ref 0.0–0.1)
Basophils Relative: 1 %
Eosinophils Absolute: 0.1 10*3/uL (ref 0.0–0.5)
Eosinophils Relative: 2 %
HCT: 29.6 % — ABNORMAL LOW (ref 36.0–46.0)
Hemoglobin: 10.6 g/dL — ABNORMAL LOW (ref 12.0–15.0)
Immature Granulocytes: 1 %
Lymphocytes Relative: 31 %
Lymphs Abs: 1.1 10*3/uL (ref 0.7–4.0)
MCH: 39.8 pg — ABNORMAL HIGH (ref 26.0–34.0)
MCHC: 35.8 g/dL (ref 30.0–36.0)
MCV: 111.3 fL — ABNORMAL HIGH (ref 80.0–100.0)
Monocytes Absolute: 0.4 10*3/uL (ref 0.1–1.0)
Monocytes Relative: 10 %
Neutro Abs: 2 10*3/uL (ref 1.7–7.7)
Neutrophils Relative %: 55 %
Platelets: 126 10*3/uL — ABNORMAL LOW (ref 150–400)
RBC: 2.66 MIL/uL — ABNORMAL LOW (ref 3.87–5.11)
RDW: 14.2 % (ref 11.5–15.5)
WBC: 3.5 10*3/uL — ABNORMAL LOW (ref 4.0–10.5)
nRBC: 0 % (ref 0.0–0.2)

## 2019-09-03 LAB — COMPREHENSIVE METABOLIC PANEL
ALT: 10 U/L (ref 0–44)
AST: 14 U/L — ABNORMAL LOW (ref 15–41)
Albumin: 4.4 g/dL (ref 3.5–5.0)
Alkaline Phosphatase: 60 U/L (ref 38–126)
Anion gap: 7 (ref 5–15)
BUN: 12 mg/dL (ref 8–23)
CO2: 23 mmol/L (ref 22–32)
Calcium: 9.6 mg/dL (ref 8.9–10.3)
Chloride: 101 mmol/L (ref 98–111)
Creatinine, Ser: 0.72 mg/dL (ref 0.44–1.00)
GFR calc Af Amer: >60 mL/min (ref >60)
GFR calc non Af Amer: >60 mL/min (ref >60)
Glucose, Bld: 89 mg/dL (ref 70–99)
Potassium: 3.8 mmol/L (ref 3.5–5.1)
Sodium: 131 mmol/L — ABNORMAL LOW (ref 135–145)
Total Bilirubin: 0.8 mg/dL (ref 0.3–1.2)
Total Protein: 7.2 g/dL (ref 6.5–8.1)

## 2019-09-03 MED ORDER — SODIUM CHLORIDE 0.9% FLUSH
10.0000 mL | Freq: Once | INTRAVENOUS | Status: AC
  Administered 2019-09-03: 10 mL via INTRAVENOUS
  Filled 2019-09-03: qty 10

## 2019-09-03 MED ORDER — HEPARIN SOD (PORK) LOCK FLUSH 100 UNIT/ML IV SOLN
INTRAVENOUS | Status: AC
  Filled 2019-09-03: qty 5

## 2019-09-03 MED ORDER — HEPARIN SOD (PORK) LOCK FLUSH 100 UNIT/ML IV SOLN
500.0000 [IU] | Freq: Once | INTRAVENOUS | Status: AC
  Administered 2019-09-03: 500 [IU] via INTRAVENOUS
  Filled 2019-09-03: qty 5

## 2019-09-04 ENCOUNTER — Inpatient Hospital Stay (HOSPITAL_BASED_OUTPATIENT_CLINIC_OR_DEPARTMENT_OTHER): Payer: Medicare Other | Admitting: Oncology

## 2019-09-04 ENCOUNTER — Encounter: Payer: Self-pay | Admitting: Oncology

## 2019-09-04 VITALS — BP 100/51 | HR 68 | Temp 97.8°F | Resp 16 | Wt 121.9 lb

## 2019-09-04 DIAGNOSIS — Z5111 Encounter for antineoplastic chemotherapy: Secondary | ICD-10-CM

## 2019-09-04 DIAGNOSIS — Z95828 Presence of other vascular implants and grafts: Secondary | ICD-10-CM

## 2019-09-04 DIAGNOSIS — D696 Thrombocytopenia, unspecified: Secondary | ICD-10-CM | POA: Diagnosis not present

## 2019-09-04 DIAGNOSIS — D61810 Antineoplastic chemotherapy induced pancytopenia: Secondary | ICD-10-CM | POA: Diagnosis not present

## 2019-09-04 DIAGNOSIS — C569 Malignant neoplasm of unspecified ovary: Secondary | ICD-10-CM | POA: Diagnosis not present

## 2019-09-04 DIAGNOSIS — T451X5A Adverse effect of antineoplastic and immunosuppressive drugs, initial encounter: Secondary | ICD-10-CM | POA: Diagnosis not present

## 2019-09-04 DIAGNOSIS — Z90722 Acquired absence of ovaries, bilateral: Secondary | ICD-10-CM | POA: Diagnosis not present

## 2019-09-04 DIAGNOSIS — D6481 Anemia due to antineoplastic chemotherapy: Secondary | ICD-10-CM

## 2019-09-04 DIAGNOSIS — R971 Elevated cancer antigen 125 [CA 125]: Secondary | ICD-10-CM | POA: Diagnosis not present

## 2019-09-04 DIAGNOSIS — Z90710 Acquired absence of both cervix and uterus: Secondary | ICD-10-CM | POA: Diagnosis not present

## 2019-09-04 DIAGNOSIS — Z79899 Other long term (current) drug therapy: Secondary | ICD-10-CM | POA: Diagnosis not present

## 2019-09-04 LAB — CA 125: Cancer Antigen (CA) 125: 70.4 U/mL — ABNORMAL HIGH (ref 0.0–38.1)

## 2019-09-04 NOTE — Progress Notes (Signed)
[facility] Follow up visit  Patient Care Team: Patient, [name], [name], [name], [name], [name] She underwent  Neoadjuvant chemotherapy of carbo and taxol x 4  # Patient had debulking surgery on 03/14/2017. She had a laparoscopy with conversion to laparotomy, total hysterectomy, with bilateral salpingo oophorectomy, right aortic lymph node dissection, omentectomy. Pathology showed small foci of residual disease in ovary.   Genetic testing negative for83 genes on [facility]'s Multi-Cancer panel (ALK, APC, ATM, AXIN2, BAP1, BARD1, BLM, BMPR1A, BRCA1, BRCA2, BRIP1, CASR, CDC73, CDH1, CDK4, CDKN1B, CDKN1C, CDKN2A, CEBPA, CHEK2, CTNNA1, DICER1, DIS3L2, EGFR, EPCAM, FH, FLCN, GATA2, GPC3, GREM1, HOXB13, HRAS, KIT, MAX, MEN1, MET, MITF, MLH1, MSH2, MSH3, MSH6, MUTYH, NBN, NF1, NF2, NTHL1, PALB2, PDGFRA, PHOX2B, PMS2, POLD1, POLE, POT1, PRKAR1A, PTCH1, PTEN, RAD50, RAD51C, RAD51D, RB1, RECQL4, RET, RUNX1, SDHA, SDHAF2, SDHB, SDHC, SDHD, SMAD4, SMARCA4, SMARCB1, SMARCE1, STK11, SUFU, TERC, TERT, TMEM127, TP53, TSC1, TSC2, VHL, WRN, WT1).  A Variant of UncertainSignificancewas detected: CASRc.106G>A (p.Gly36Arg). Myraid testing negative for somatic BRACA1/2, positive for HRD.   # HRD positive, was referred to [facility] for clinical trials of Olarparib maintenance. She opted out.  #  Treatment:  Stage IIIC Ovarian cancer:  #s/p Carboplatin and taxol x 4 neoadjuvant chemotherapy followed by debulking surgery  03/14/2017 # 03/28/2017 S/p Adjuvant carbo and taxol x3   Local recurrence, CA125 one 46.3 03/15/2018-05/17/2018 Carboplatin and Taxol x 4. CA125 decrease from 49.7 to 6 after 4 cycles of treatment.  Started on Olarparib 342m BID on 05/17/2018.  # was seen by Dr. BFransisca Connorsfor further evaluation due to rising Ca1 25 and MRI abdomenPelvis on 11/05/2018 showed Interval progression of, now measuring 2.7 x 2.3 cm and concerning for progression of metastatic disease.retrocaval lymph node in the abdomen Olaparib was held for short period of time. Her CA125 trended down again.  Dr. BFransisca Connorsrecommending resume olaparib and continue monitor. If she has more significant progression of disease she may be a candidate for clinical trial or will be treated with other chemotherapy drugs including Doxil, gemcitabine and Avastin.   INTERVAL HISTORY 69y.o. female with above oncology history reviewed by me today presents for evaluation for chemotherapy for treatment of ovarian cancer  Patient was accompanied by her husband. Patient has no new complaints today. Her appetite is fair.  She has lost 2 pounds since last visit.  Review of Systems  Constitutional: Negative for appetite change, chills, fatigue and fever.  HENT:   Negative for hearing loss and voice change.   Eyes: Negative for eye problems.  Respiratory: Negative for chest tightness and cough.   Cardiovascular: Negative for chest pain.  Gastrointestinal: Negative for abdominal distention, abdominal pain and blood in stool.  Endocrine: Negative for hot flashes.  Genitourinary: Negative for bladder incontinence, difficulty urinating, frequency and hematuria.   Musculoskeletal: Negative for arthralgias.  Skin: Negative for itching and rash.  Neurological: Negative for extremity weakness.  Hematological: Negative for adenopathy.  Psychiatric/Behavioral: Negative for confusion.   MEDICAL HISTORY: Past Medical History:  Diagnosis Date  . Dysrhythmia    . Genetic  testing 03/28/2017   Multi-Cancer panel (83 genes) @ [facility] - No pathogenic mutations detected  . High grade ovarian cancer ([place]) 11/20/2016  . Pelvic mass in female     SURGICAL HISTORY: Past Surgical History:  Procedure Laterality Date  . APPENDECTOMY    . LAPAROSCOPY N/A 03/14/2017   Procedure: LAPAROSCOPY OPERATIVE;  Surgeon: Mellody Drown, MD;  Location: [facility];  Service: Gynecology;  Laterality: N/A;  . LAPAROTOMY N/A 03/14/2017   Procedure: LAPAROTOMY;  Surgeon: Mellody Drown, MD;  Location: [facility];  Service: Gynecology;  Laterality: N/A;  . LYMPH NODE DISSECTION N/A 03/14/2017   Procedure: LYMPH NODE DISSECTION;  Surgeon: Mellody Drown, MD;  Location: [facility];  Service: Gynecology;  Laterality: N/A;  . OMENTECTOMY N/A 03/14/2017   Procedure: OMENTECTOMY;  Surgeon: Mellody Drown, MD;  Location: [facility];  Service: Gynecology;  Laterality: N/A;  . PORTA CATH INSERTION N/A 11/27/2016   Procedure: Glori Luis Cath Insertion;  Surgeon: Algernon Huxley, MD;  Location: [facility];  Service: Cardiovascular;  Laterality: N/A;    SOCIAL HISTORY: Social History   Socioeconomic History  . Marital status: Married    Spouse name: Not on file  . Number of children: Not on file  . Years of education: Not on file  . Highest education level: Not on file  Occupational History  . Not on file  Tobacco Use  . Smoking status: Never Smoker  . Smokeless tobacco: Never Used  Substance and Sexual Activity  . Alcohol use: Not Currently  . Drug use: No  . Sexual activity: Yes    Birth control/protection: Post-menopausal  Other Topics Concern  . Not on file  Social History Narrative  . Not on file   Social Determinants of Health   Financial Resource Strain:   . Difficulty of Paying Living Expenses:   Food Insecurity:   . Worried About Charity fundraiser in the Last Year:   . Arboriculturist in the Last Year:   Transportation Needs:   . Film/video editor  (Medical):   [name] Lack of Transportation (Non-Medical):   Physical Activity:   . Days of Exercise per Week:   . Minutes of Exercise per Session:   Stress:   . Feeling of Stress :   Social Connections:   . Frequency of Communication with Friends and Family:   . Frequency of Social Gatherings with Friends and Family:   . Attends Religious Services:   . Active Member of Clubs or Organizations:   . Attends Archivist Meetings:   [name] Marital Status:   Intimate Partner Violence:   . Fear of Current or Ex-Partner:   . Emotionally Abused:   [name] Physically Abused:   . Sexually Abused:     FAMILY HISTORY Family History  Problem Relation Age of Onset  . Throat cancer Cousin   . Throat cancer Cousin   . Leukemia Cousin     ALLERGIES:  is allergic to omeprazole.  MEDICATIONS:  Current Outpatient Medications  Medication Sig Dispense Refill  . lidocaine-prilocaine (EMLA) cream Apply 1 application topically as needed. Apply small amount to port site at least 1 hour prior to it being accessed, cover with plastic wrap 30 g 1  . LYNPARZA 150 MG tablet TAKE TWO TABLETS BY MOUTH TWICE A DAY 120 tablet 2  . vitamin B-12 (CYANOCOBALAMIN) 1000 MCG tablet Take 1 tablet (1,000 mcg total) by mouth daily. 90 tablet 4   No current facility-administered medications for  this visit.    PHYSICAL EXAMINATION:  ECOG PERFORMANCE STATUS: 0 - Asymptomatic Vitals:   09/04/19 1008  BP: (!) 100/51  Pulse: 68  Resp: 16  Temp: 97.8 F (36.6 C)    Filed Weights   09/04/19 1008  Weight: 121 lb 14.4 oz (55.3 kg)     Physical Exam  Constitutional: She is oriented to person, place, and time. No distress.  Thin built, she walks independantly  HENT:  Head: Normocephalic and atraumatic.  Nose: Nose normal.  Mouth/Throat: Oropharynx is clear and moist. No oropharyngeal exudate.  Eyes: Pupils are equal, round, and reactive to light. EOM are normal. No scleral icterus.  Neck: No JVD present.   Cardiovascular: Normal rate and regular rhythm.  No murmur heard. Pulmonary/Chest: Effort normal and breath sounds normal. No respiratory distress. She has no rales. She exhibits no tenderness.  Abdominal: Soft. Bowel sounds are normal. She exhibits no distension. There is no abdominal tenderness.  Musculoskeletal:        General: No edema. Normal range of motion.     Cervical back: Normal range of motion and neck supple.  Lymphadenopathy:    She has no cervical adenopathy.  Neurological: She is alert and oriented to person, place, and time. No cranial nerve deficit. She exhibits normal muscle tone. Coordination normal.  Skin: Skin is warm and dry. She is not diaphoretic. No erythema.  Right anterior medi port +   Psychiatric: Affect and judgment normal.     LABORATORY DATA: I have personally reviewed the data as listed: CBC    Component Value Date/Time   WBC 3.5 (L) 09/03/2019 1348   RBC 2.66 (L) 09/03/2019 1348   HGB 10.6 (L) 09/03/2019 1348   HCT 29.6 (L) 09/03/2019 1348   PLT 126 (L) 09/03/2019 1348   MCV 111.3 (H) 09/03/2019 1348   MCH 39.8 (H) 09/03/2019 1348   MCHC 35.8 09/03/2019 1348   RDW 14.2 09/03/2019 1348   LYMPHSABS 1.1 09/03/2019 1348   MONOABS 0.4 09/03/2019 1348   EOSABS 0.1 09/03/2019 1348   BASOSABS 0.0 09/03/2019 1348   CMP Latest Ref Rng & Units 09/03/2019 08/04/2019 07/02/2019  Glucose 70 - 99 mg/dL 89 100(H) 84  BUN 8 - 23 mg/dL _0 Creatinine 0.44 - 1.00 mg/dL 0.72 0.75 0.72  Sodium 135 - 145 mmol/L 131(L) 133(L) 133(L)  Potassium 3.5 - 5.1 mmol/L 3.8 4.0 3.7  Chloride 98 - 111 mmol/L 101 100 102  CO2 22 - 32 mmol/L _1 Calcium 8.9 - 10.3 mg/dL 9.6 9.8 9.5  Total Protein 6.5 - 8.1 g/dL 7.2 7.5 7.0  Total Bilirubin 0.3 - 1.2 mg/dL 0.8 0.8 0.8  Alkaline Phos 38 - 126 U/L 60 60 59  AST 15 - 41 U/L 14(L) 15 14(L)  ALT 0 - 44 U/L _2 Pathology 11/16/2016 Surgical Pathology  CASE: [id number]  PATIENT: Dorcus Yo   Surgical Pathology Report   SPECIMEN SUBMITTED:  A. Retroperitoneal adenopathy, left  DIAGNOSIS:  A. LYMPH NODE, LEFT RETROPERITONEAL; CT-GUIDED CORE BIOPSY:  - METASTATIC HIGH-GRADE SEROUS CARCINOMA.  Pathology 03/14/2017   DIAGNOSIS:  A. OMENTUM; OMENTECTOMY:  - NO TUMOR SEEN.  - ONE NEGATIVE LYMPH NODE (0/1).   B. RIGHT FALLOPIAN TUBE AND OVARY; SALPINGO-OOPHORECTOMY:  - SMALL FOCI OF HIGH GRADE SEROUS CARCINOMA INVOLVING THE OVARY.  - MARKED THERAPY RELATED CHANGE.  - NO TUMOR SEEN IN THE FALLOPIAN TUBE.   C. UTERUS, CERVIX, LEFT FALLOPIAN TUBE  AND OVARY; HYSTERECTOMY AND LEFT  SALPINGO-OOPHORECTOMY:  - NABOTHIAN CYSTS.  - CYSTIC ATROPHY OF THE ENDOMETRIUM.  - SEROSAL ADHESIONS.  - UNREMARKABLE FALLOPIAN TUBE.  - OVARY SHOWING TREATMENT RELATED CHANGE.   D. 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    RADIOGRAPHIC STUDIES: I have personally reviewed the radiological images as listed and agreed with the findings in the report. CT Chest W Contrast  Result Date: 06/26/2019 CLINICAL DATA:  69 year old female with history of ovarian cancer. Follow-up study. EXAM: CT CHEST, ABDOMEN, AND PELVIS WITH CONTRAST TECHNIQUE: Multidetector CT imaging of the chest, abdomen and pelvis was performed following the standard protocol during bolus administration of intravenous contrast. CONTRAST:  29m OMNIPAQUE IOHEXOL 300 MG/ML  SOLN COMPARISON:  CT the abdomen and pelvis 04/28/2019. CT the chest, abdomen and pelvis 08/28/2018. FINDINGS: CT CHEST FINDINGS Cardiovascular: Heart size is normal. There is no significant pericardial fluid, thickening or pericardial calcification. Aortic atherosclerosis. No definite coronary artery calcifications. Right internal jugular single-lumen porta cath with tip terminating in the distal superior vena cava. Mediastinum/Nodes: No pathologically enlarged mediastinal or hilar lymph  nodes. Esophagus is unremarkable in appearance. No axillary lymphadenopathy. Lungs/Pleura: Multiple small pulmonary nodules are again noted in the lungs bilaterally, stable in size and number compared to the prior study from 08/28/2018, largest of which measures 6 mm in the left lower lobe (axial image 93 of series 4). No other suspicious appearing pulmonary nodules or masses are noted. No acute consolidative airspace disease. No pleural effusions. Musculoskeletal: There are no aggressive appearing lytic or blastic lesions noted in the visualized portions of the skeleton. CT ABDOMEN PELVIS FINDINGS Hepatobiliary: No suspicious cystic or solid hepatic lesions. No intra or extrahepatic biliary ductal dilatation. Gallbladder is normal in appearance. Pancreas: No pancreatic mass. No pancreatic ductal dilatation. No pancreatic or peripancreatic fluid collections or inflammatory changes. Spleen: Unremarkable. Adrenals/Urinary Tract: Bilateral kidneys and adrenal glands are normal in appearance. No hydroureteronephrosis. Urinary bladder is normal in appearance. Stomach/Bowel: Normal appearance of the stomach. No pathologic dilatation of small bowel or colon. The appendix is not confidently identified and may be surgically absent. Regardless, there are no inflammatory changes noted adjacent to the cecum to suggest the presence of an acute appendicitis at this time. Vascular/Lymphatic: Aortic atherosclerosis, without evidence of aneurysm or dissection in the abdominal or pelvic vasculature. Enlarged retrocaval lymph nodes measuring up to 2 cm in short axis (axial image 73 of series 2), very similar to the prior examination. Adjacent aortocaval lymph node measuring 1.6 cm in short axis (axial image 71 of series 2) increased from 1.3 cm in short axis on the prior. Reproductive: Status post total abdominal hysterectomy and bilateral salpingo-oophorectomy. Persistent small 3.6 x 1.7 cm intermediate attenuation (35 HU) collection  of material in the posterior aspect of the anatomic pelvis (axial image 107 of series 2), stable compared to the prior study. Other: No significant volume of ascites.  No pneumoperitoneum. Musculoskeletal: There are no aggressive appearing lytic or blastic lesions noted in the visualized portions of the skeleton. IMPRESSION: 1. Previously noted retrocaval lymph node is stable, while the adjacent aortocaval lymph node has slightly increased in size compared to the prior examination. Previously noted soft tissue attenuation lesion in the posterior aspect of the anatomic pelvis appear stable compared to the prior study. No other new sites of metastatic disease are noted elsewhere in the chest, abdomen or pelvis. 2. Previously noted small pulmonary nodules are all stable in  number and size compared to the prior examinations, favored to be benign. 3. Aortic atherosclerosis. Electronically Signed   By: Vinnie Langton M.D.   On: 06/26/2019 11:26   CT Abdomen Pelvis W Contrast  Result Date: 06/26/2019 CLINICAL DATA:  69 year old female with history of ovarian cancer. Follow-up study. EXAM: CT CHEST, ABDOMEN, AND PELVIS WITH CONTRAST TECHNIQUE: Multidetector CT imaging of the chest, abdomen and pelvis was performed following the standard protocol during bolus administration of intravenous contrast. CONTRAST:  42m OMNIPAQUE IOHEXOL 300 MG/ML  SOLN COMPARISON:  CT the abdomen and pelvis 04/28/2019. CT the chest, abdomen and pelvis 08/28/2018. FINDINGS: CT CHEST FINDINGS Cardiovascular: Heart size is normal. There is no significant pericardial fluid, thickening or pericardial calcification. Aortic atherosclerosis. No definite coronary artery calcifications. Right internal jugular single-lumen porta cath with tip terminating in the distal superior vena cava. Mediastinum/Nodes: No pathologically enlarged mediastinal or hilar lymph nodes. Esophagus is unremarkable in appearance. No axillary lymphadenopathy. Lungs/Pleura:  Multiple small pulmonary nodules are again noted in the lungs bilaterally, stable in size and number compared to the prior study from 08/28/2018, largest of which measures 6 mm in the left lower lobe (axial image 93 of series 4). No other suspicious appearing pulmonary nodules or masses are noted. No acute consolidative airspace disease. No pleural effusions. Musculoskeletal: There are no aggressive appearing lytic or blastic lesions noted in the visualized portions of the skeleton. CT ABDOMEN PELVIS FINDINGS Hepatobiliary: No suspicious cystic or solid hepatic lesions. No intra or extrahepatic biliary ductal dilatation. Gallbladder is normal in appearance. Pancreas: No pancreatic mass. No pancreatic ductal dilatation. No pancreatic or peripancreatic fluid collections or inflammatory changes. Spleen: Unremarkable. Adrenals/Urinary Tract: Bilateral kidneys and adrenal glands are normal in appearance. No hydroureteronephrosis. Urinary bladder is normal in appearance. Stomach/Bowel: Normal appearance of the stomach. No pathologic dilatation of small bowel or colon. The appendix is not confidently identified and may be surgically absent. Regardless, there are no inflammatory changes noted adjacent to the cecum to suggest the presence of an acute appendicitis at this time. Vascular/Lymphatic: Aortic atherosclerosis, without evidence of aneurysm or dissection in the abdominal or pelvic vasculature. Enlarged retrocaval lymph nodes measuring up to 2 cm in short axis (axial image 73 of series 2), very similar to the prior examination. Adjacent aortocaval lymph node measuring 1.6 cm in short axis (axial image 71 of series 2) increased from 1.3 cm in short axis on the prior. Reproductive: Status post total abdominal hysterectomy and bilateral salpingo-oophorectomy. Persistent small 3.6 x 1.7 cm intermediate attenuation (35 HU) collection of material in the posterior aspect of the anatomic pelvis (axial image 107 of series 2),  stable compared to the prior study. Other: No significant volume of ascites.  No pneumoperitoneum. Musculoskeletal: There are no aggressive appearing lytic or blastic lesions noted in the visualized portions of the skeleton. IMPRESSION: 1. Previously noted retrocaval lymph node is stable, while the adjacent aortocaval lymph node has slightly increased in size compared to the prior examination. Previously noted soft tissue attenuation lesion in the posterior aspect of the anatomic pelvis appear stable compared to the prior study. No other new sites of metastatic disease are noted elsewhere in the chest, abdomen or pelvis. 2. Previously noted small pulmonary nodules are all stable in number and size compared to the prior examinations, favored to be benign. 3. Aortic atherosclerosis. Electronically Signed   By: DVinnie LangtonM.D.   On: 06/26/2019 11:26    ASSESSMENT/PLAN Cancer Staging Malignant neoplasm of ovary ([facility] Staging form:  Ovary, Fallopian Tube, and Primary Peritoneal Carcinoma, AJCC 8th Edition - Clinical stage from 11/22/2016: Stage IIIC (cT3c, cN1b, cM0) - Signed by Earlie Server, MD on 11/22/2016  1. High grade ovarian cancer ([place])   2. Port-A-Cath in place   3. Thrombocytopenia ([place])   4. Anemia associated with chemotherapy   5. Encounter for antineoplastic chemotherapy   : # Ovarian Cancer, local recurrence.  Currently on olaparib 300 mg twice daily. She tolerates well.  CA 125 18.7-->61.2-->111-->90.5-->69--> 54.8--> 29.1-->47-->71.2-->67.7->73.2-->82.8-->70.4 Labs reviewed and discussed with patient. Recommend patient to continue olaparib 300 mg twice daily. Tolerates well. Most recent CA-125 trended down. Last CT scan was done in March 2021. I will obtain surveillance CT scan prior to next visit, which will be approximately 3 months . 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  Hemoglobins are stable. Thrombocytopenia, grade 1, stable. Port flush every 6 to 8  weeks. RTF 4 weeks  Orders Placed This Encounter  Procedures  . CT Chest W Contrast    Standing Status:   Future    Standing Expiration Date:   09/03/2020    Order Specific Question:   ** REASON FOR EXAM (FREE TEXT)    Answer:   ovarian cancer    Order Specific Question:   If indicated for the ordered procedure, I authorize the administration of contrast media per Radiology protocol    Answer:   Yes    Order Specific Question:   Preferred imaging location?    Answer:   [facility]    Order Specific Question:   Radiology Contrast Protocol - do NOT remove file path    Answer:   _0 [email or web address]  . CT Abdomen Pelvis W Contrast    Standing Status:   Future    Standing Expiration Date:   09/03/2020    Order Specific Question:   ** REASON FOR EXAM (FREE TEXT)    Answer:   ovarian cancer    Order Specific Question:   If indicated for the ordered procedure, I authorize the administration of contrast media per Radiology protocol    Answer:   Yes    Order Specific Question:   Preferred imaging location?    Answer:   [facility]    Order Specific Question:   Is Oral Contrast requested for this exam?    Answer:   Yes, Per Radiology protocol    Order Specific Question:   Radiology Contrast Protocol - do NOT remove file path    Answer:   _1 [email or web address]  . CBC with Differential/Platelet    Standing Status:   Future    Standing Expiration Date:   09/03/2020  . Comprehensive metabolic panel    Standing Status:   Future    Standing Expiration Date:   09/03/2020  . CA 125    Standing Status:   Future    Standing Expiration Date:   09/03/2020    Earlie Server, MD, PhD Hematology Oncology [facility] Pager- [phone] 09/04/19

## 2019-09-04 NOTE — Progress Notes (Signed)
Patient denies new problems/concerns today.   °

## 2019-09-25 ENCOUNTER — Other Ambulatory Visit: Payer: Self-pay | Admitting: Oncology

## 2019-09-25 DIAGNOSIS — C569 Malignant neoplasm of unspecified ovary: Secondary | ICD-10-CM

## 2019-09-25 NOTE — Telephone Encounter (Signed)
CBC with Differential/Platelet Order: [id number] Status:  Final result  Visible to patient:  No (inaccessible in MyChart)  Next appt:  09/29/2019 at 10:15 AM in Oncology ([facility])  Dx:  High grade ovarian cancer (HCC)  0 Result Notes  Ref Range & Units 3 wk ago  WBC 4.0 - 10.5 K/uL 3.5Low   RBC 3.87 - 5.11 MIL/uL 2.66Low   Hemoglobin 12.0 - 15.0 g/dL 10.6Low   HCT 36 - 46 % 29.6Low   MCV 80.0 - 100.0 fL 111.3High   MCH 26.0 - 34.0 pg 39.8High   MCHC 30.0 - 36.0 g/dL 35.8   RDW 11.5 - 15.5 % 14.2   Platelets 150 - 400 K/uL 126Low   nRBC 0.0 - 0.2 % 0.0   Neutrophils Relative % % 55   Neutro Abs 1.7 - 7.7 K/uL 2.0   Lymphocytes Relative % 31   Lymphs Abs 0.7 - 4.0 K/uL 1.1   Monocytes Relative % 10   Monocytes Absolute 0 - 1 K/uL 0.4   Eosinophils Relative % 2   Eosinophils Absolute 0 - 0 K/uL 0.1   Basophils Relative % 1   Basophils Absolute 0 - 0 K/uL 0.0   Immature Granulocytes % 1   Abs Immature Granulocytes 0.00 - 0.07 K/uL 0.03   Comment: Performed at [facility], [address] [place], [place] [address]  Resulting Agency  [facility]      Specimen Collected: 09/03/19 13:48  Last Resulted: 09/03/19 14:02     Lab Flowsheet   Order Details   View Encounter   Lab and Collection Details   Routing   Result History       Result Care Coordination  Patient Communication  Add Comments Not seen Back to Top      Other Results from 09/03/2019  Contains abnormal dataCA 125  Status:  Final result  Visible to patient:  No (inaccessible in MyChart)  Next appt:  09/29/2019 at 10:15 AM in Oncology ([facility])  Dx:  High grade ovarian cancer ([place]) Order: [id number]  0 Result Notes  Ref Range & Units 3 wk ago  Cancer Antigen (CA) 125 0.0 - 38.1 U/mL 70.4High   Comment: (NOTE)  Roche Diagnostics Electrochemiluminescence Immunoassay (ECLIA)  Values obtained with different assay methods or kits cannot be  used interchangeably.  Results cannot be interpreted as absolute  evidence of the presence or absence of malignant disease.  Performed At: [facility]  [place], [place] [id number]  Rush Farmer MD SP:[id number]   Resulting Agency  [facility] LAB      Specimen Collected: 09/03/19 13:48  Last Resulted: 09/04/19 03:35     Lab Flowsheet   Order Details   View Encounter   Lab and Collection Details   Routing   Result History       Result Care Coordination  Patient Communication  Add Comments Not seen Back to Top        Contains abnormal dataComprehensive metabolic panel  Status:  Final result  Visible to patient:  No (inaccessible in MyChart)  Next appt:  09/29/2019 at 10:15 AM in Oncology ([facility])  Dx:  High grade ovarian cancer ([place]) Order: [id number]  0 Result Notes  Ref Range & Units 3 wk ago  Sodium 135 - 145 mmol/L 131Low   Potassium 3.5 - 5.1 mmol/L 3.8   Chloride 98 - 111 mmol/L 101   CO2 22 - 32 mmol/L 23   Glucose, Bld 70 -  99 mg/dL 89   Comment: Glucose reference range applies only to samples taken after fasting for at least 8 hours.  BUN 8 - 23 mg/dL 12   Creatinine, Ser 0.44 - 1.00 mg/dL 0.72   Calcium 8.9 - 10.3 mg/dL 9.6   Total Protein 6.5 - 8.1 g/dL 7.2   Albumin 3.5 - 5.0 g/dL 4.4   AST 15 - 41 U/L 14Low   ALT 0 - 44 U/L 10   Alkaline Phosphatase 38 - 126 U/L 60   Total Bilirubin 0.3 - 1.2 mg/dL 0.8   GFR calc non Af Amer >60 mL/min >60   GFR calc Af Amer >60 mL/min >60   Anion gap 5 - 15 7   Comment: Performed at [facility], [address] [place], [place] [address]  Resulting Agency  [facility] CLIN LAB      Specimen Collected: 09/03/19 13:48  Last Resulted: 09/03/19 14:23

## 2019-09-29 ENCOUNTER — Other Ambulatory Visit: Payer: Self-pay

## 2019-09-29 ENCOUNTER — Inpatient Hospital Stay: Payer: Medicare Other | Attending: Oncology

## 2019-09-29 ENCOUNTER — Ambulatory Visit
Admission: RE | Admit: 2019-09-29 | Discharge: 2019-09-29 | Disposition: A | Discharge status: Home / Self Care | Payer: Medicare Other | Source: Ambulatory Visit | Attending: Oncology | Admitting: Oncology

## 2019-09-29 DIAGNOSIS — D696 Thrombocytopenia, unspecified: Secondary | ICD-10-CM | POA: Insufficient documentation

## 2019-09-29 DIAGNOSIS — D7589 Other specified diseases of blood and blood-forming organs: Secondary | ICD-10-CM | POA: Diagnosis not present

## 2019-09-29 DIAGNOSIS — D6481 Anemia due to antineoplastic chemotherapy: Secondary | ICD-10-CM | POA: Diagnosis not present

## 2019-09-29 DIAGNOSIS — C569 Malignant neoplasm of unspecified ovary: Secondary | ICD-10-CM | POA: Insufficient documentation

## 2019-09-29 DIAGNOSIS — Z8543 Personal history of malignant neoplasm of ovary: Secondary | ICD-10-CM | POA: Diagnosis not present

## 2019-09-29 LAB — CBC WITH DIFFERENTIAL/PLATELET
Abs Immature Granulocytes: 0.02 10*3/uL (ref 0.00–0.07)
Basophils Absolute: 0 10*3/uL (ref 0.0–0.1)
Basophils Relative: 1 %
Eosinophils Absolute: 0.1 10*3/uL (ref 0.0–0.5)
Eosinophils Relative: 2 %
HCT: 31.7 % — ABNORMAL LOW (ref 36.0–46.0)
Hemoglobin: 11.1 g/dL — ABNORMAL LOW (ref 12.0–15.0)
Immature Granulocytes: 1 %
Lymphocytes Relative: 27 %
Lymphs Abs: 0.8 10*3/uL (ref 0.7–4.0)
MCH: 40.2 pg — ABNORMAL HIGH (ref 26.0–34.0)
MCHC: 35 g/dL (ref 30.0–36.0)
MCV: 114.9 fL — ABNORMAL HIGH (ref 80.0–100.0)
Monocytes Absolute: 0.3 10*3/uL (ref 0.1–1.0)
Monocytes Relative: 9 %
Neutro Abs: 1.9 10*3/uL (ref 1.7–7.7)
Neutrophils Relative %: 60 %
Platelets: 120 10*3/uL — ABNORMAL LOW (ref 150–400)
RBC: 2.76 MIL/uL — ABNORMAL LOW (ref 3.87–5.11)
RDW: 14.4 % (ref 11.5–15.5)
WBC: 3.1 10*3/uL — ABNORMAL LOW (ref 4.0–10.5)
nRBC: 0 % (ref 0.0–0.2)

## 2019-09-29 LAB — COMPREHENSIVE METABOLIC PANEL
ALT: 10 U/L (ref 0–44)
AST: 16 U/L (ref 15–41)
Albumin: 4.6 g/dL (ref 3.5–5.0)
Alkaline Phosphatase: 63 U/L (ref 38–126)
Anion gap: 9 (ref 5–15)
BUN: 9 mg/dL (ref 8–23)
CO2: 24 mmol/L (ref 22–32)
Calcium: 9.9 mg/dL (ref 8.9–10.3)
Chloride: 99 mmol/L (ref 98–111)
Creatinine, Ser: 0.81 mg/dL (ref 0.44–1.00)
GFR calc Af Amer: >60 mL/min (ref >60)
GFR calc non Af Amer: >60 mL/min (ref >60)
Glucose, Bld: 101 mg/dL — ABNORMAL HIGH (ref 70–99)
Potassium: 4.3 mmol/L (ref 3.5–5.1)
Sodium: 132 mmol/L — ABNORMAL LOW (ref 135–145)
Total Bilirubin: 0.8 mg/dL (ref 0.3–1.2)
Total Protein: 7.5 g/dL (ref 6.5–8.1)

## 2019-09-29 MED ORDER — IOHEXOL 300 MG/ML  SOLN
85.0000 mL | Freq: Once | INTRAMUSCULAR | Status: AC | PRN
  Administered 2019-09-29: 85 mL via INTRAVENOUS

## 2019-09-30 LAB — CA 125: Cancer Antigen (CA) 125: 66.8 U/mL — ABNORMAL HIGH (ref 0.0–38.1)

## 2019-10-02 ENCOUNTER — Other Ambulatory Visit: Payer: Self-pay

## 2019-10-02 ENCOUNTER — Inpatient Hospital Stay (HOSPITAL_BASED_OUTPATIENT_CLINIC_OR_DEPARTMENT_OTHER): Payer: Medicare Other | Admitting: Oncology

## 2019-10-02 ENCOUNTER — Encounter: Payer: Self-pay | Admitting: Oncology

## 2019-10-02 VITALS — BP 101/61 | HR 66 | Temp 97.8°F | Resp 20 | Wt 120.1 lb

## 2019-10-02 DIAGNOSIS — T451X5A Adverse effect of antineoplastic and immunosuppressive drugs, initial encounter: Secondary | ICD-10-CM

## 2019-10-02 DIAGNOSIS — C569 Malignant neoplasm of unspecified ovary: Secondary | ICD-10-CM | POA: Diagnosis not present

## 2019-10-02 DIAGNOSIS — D6481 Anemia due to antineoplastic chemotherapy: Secondary | ICD-10-CM

## 2019-10-02 DIAGNOSIS — Z95828 Presence of other vascular implants and grafts: Secondary | ICD-10-CM | POA: Diagnosis not present

## 2019-10-02 DIAGNOSIS — Z5111 Encounter for antineoplastic chemotherapy: Secondary | ICD-10-CM

## 2019-10-02 DIAGNOSIS — D696 Thrombocytopenia, unspecified: Secondary | ICD-10-CM

## 2019-10-02 NOTE — Progress Notes (Signed)
[place] [facility] Follow up visit  Patient Care Team: Patient, [name], [name], [name], [name], [name] She underwent  Neoadjuvant chemotherapy of carbo and taxol x 4  # Patient had debulking surgery on 03/14/2017. She had a laparoscopy with conversion to laparotomy, total hysterectomy, with bilateral salpingo oophorectomy, right aortic lymph node dissection, omentectomy. Pathology showed small foci of residual disease in ovary.   Genetic testing negative for83 genes on [facility]'s Multi-Cancer panel (ALK, APC, ATM, AXIN2, BAP1, BARD1, BLM, BMPR1A, BRCA1, BRCA2, BRIP1, CASR, CDC73, CDH1, CDK4, CDKN1B, CDKN1C, CDKN2A, CEBPA, CHEK2, CTNNA1, DICER1, DIS3L2, EGFR, EPCAM, FH, FLCN, GATA2, GPC3, GREM1, HOXB13, HRAS, KIT, MAX, MEN1, MET, MITF, MLH1, MSH2, MSH3, MSH6, MUTYH, NBN, NF1, NF2, NTHL1, PALB2, PDGFRA, PHOX2B, PMS2, POLD1, POLE, POT1, PRKAR1A, PTCH1, PTEN, RAD50, RAD51C, RAD51D, RB1, RECQL4, RET, RUNX1, SDHA, SDHAF2, SDHB, SDHC, SDHD, SMAD4, SMARCA4, SMARCB1, SMARCE1, STK11, SUFU, TERC, TERT, TMEM127, TP53, TSC1, TSC2, VHL, WRN, WT1).  A Variant of UncertainSignificancewas detected: CASRc.106G>A (p.Gly36Arg). Myraid testing negative for somatic BRACA1/2, positive for HRD.   # HRD positive, was referred to [facility] for clinical trials of Olarparib maintenance. She opted out.  #  Treatment:  Stage IIIC Ovarian cancer:  #s/p Carboplatin and taxol x 4 neoadjuvant chemotherapy followed by debulking surgery  03/14/2017 # 03/28/2017 S/p Adjuvant carbo and taxol x3   Local recurrence, CA125 one 46.3 03/15/2018-05/17/2018 Carboplatin and Taxol x 4. CA125 decrease from 49.7 to 6 after 4 cycles of treatment.  Started on Olarparib 332m BID on 05/17/2018.  # was seen by Dr. BFransisca Connorsfor further evaluation due to rising Ca1 25 and MRI abdomenPelvis on 11/05/2018 showed Interval progression of, now measuring 2.7 x 2.3 cm and concerning for progression of metastatic disease.retrocaval lymph node in the abdomen Olaparib was held for short period of time. Her CA125 trended down again.  Dr. BFransisca Connorsrecommending resume olaparib and continue monitor. If she has more significant progression of disease she may be a candidate for clinical trial or will be treated with other chemotherapy drugs including Doxil, gemcitabine and Avastin.   INTERVAL HISTORY 69y.o. female with above oncology history reviewed by me today presents for evaluation for chemotherapy for treatment of ovarian cancer  Patient was accompanied by her husband. Patient has no new complaints today.  Appetite is fair.  She has lost 1 pound since last visit.  Patient had CT done during interval.  Review of Systems  Constitutional: Negative for appetite change, chills, fatigue and fever.  HENT:   Negative for hearing loss and voice change.   Eyes: Negative for eye problems.  Respiratory: Negative for chest tightness and cough.   Cardiovascular: Negative for chest pain.  Gastrointestinal: Negative for abdominal distention, abdominal pain and blood in stool.  Endocrine: Negative for hot flashes.  Genitourinary: Negative for bladder incontinence, difficulty urinating, frequency and hematuria.   Musculoskeletal: Negative for arthralgias.  Skin: Negative for itching and rash.  Neurological: Negative for extremity weakness.  Hematological: Negative for adenopathy.  Psychiatric/Behavioral: Negative for confusion.   MEDICAL HISTORY: Past Medical History:   Diagnosis Date  .  Dysrhythmia   . Genetic testing 03/28/2017   Multi-Cancer panel (83 genes) @ [facility] - No pathogenic mutations detected  . High grade ovarian cancer ([name]) 11/20/2016  . Pelvic mass in female     SURGICAL HISTORY: Past Surgical History:  Procedure Laterality Date  . APPENDECTOMY    . LAPAROSCOPY N/A 03/14/2017   Procedure: LAPAROSCOPY OPERATIVE;  Surgeon: Mellody Drown, MD;  Location: [facility] ORS;  Service: Gynecology;  Laterality: N/A;  . LAPAROTOMY N/A 03/14/2017   Procedure: LAPAROTOMY;  Surgeon: Mellody Drown, MD;  Location: [facility] ORS;  Service: Gynecology;  Laterality: N/A;  . LYMPH NODE DISSECTION N/A 03/14/2017   Procedure: LYMPH NODE DISSECTION;  Surgeon: Mellody Drown, MD;  Location: [facility] ORS;  Service: Gynecology;  Laterality: N/A;  . OMENTECTOMY N/A 03/14/2017   Procedure: OMENTECTOMY;  Surgeon: Mellody Drown, MD;  Location: [facility] OR[facility];  Service: Gynecology;  Laterality: N/A;  . PORTA CATH INSERTION N/A 11/27/2016   Procedure: Glori Luis Cath Insertion;  Surgeon: Algernon Huxley, MD;  Location: [place] [facility];  Service: Cardiovascular;  Laterality: N/A;    SOCIAL HISTORY: Social History   Socioeconomic History  . Marital status: Married    Spouse name: Not on file  . Number of children: Not on file  . Years of education: Not on file  . Highest education level: Not on file  Occupational History  . Not on file  Tobacco Use  . Smoking status: Never Smoker  . Smokeless tobacco: Never Used  Vaping Use  . Vaping Use: Never used  Substance and Sexual Activity  . Alcohol use: Not Currently  . Drug use: No  . Sexual activity: Yes    Birth control/protection: Post-menopausal  Other Topics Concern  . Not on file  Social History Narrative  . Not on file   Social Determinants of Health   Financial Resource Strain:   . Difficulty of Paying Living Expenses:   Food Insecurity:   . Worried About Charity fundraiser in the Last Year:   . Academic librarian in the Last Year:   Transportation Needs:   . Film/video editor (Medical):   [name] Lack of Transportation (Non-Medical):   Physical Activity:   . Days of Exercise per Week:   . Minutes of Exercise per Session:   Stress:   . Feeling of Stress :   Social Connections:   . Frequency of Communication with Friends and Family:   . Frequency of Social Gatherings with Friends and Family:   . Attends Religious Services:   . Active Member of Clubs or Organizations:   . Attends Archivist Meetings:   [name] Marital Status:   Intimate Partner Violence:   . Fear of Current or Ex-Partner:   . Emotionally Abused:   [name] Physically Abused:   . Sexually Abused:     FAMILY HISTORY Family History  Problem Relation Age of Onset  . Throat cancer Cousin   . Throat cancer Cousin   . Leukemia Cousin     ALLERGIES:  is allergic to omeprazole.  MEDICATIONS:  Current Outpatient Medications  Medication Sig Dispense Refill  . lidocaine-prilocaine (EMLA) cream Apply 1 application topically as needed. Apply small amount to port site at least 1 hour prior to it being accessed, cover with plastic wrap 30 g 1  . LYNPARZA 150 MG tablet TAKE TWO TABLETS BY MOUTH TWICE A DAY 120 tablet 2  . vitamin B-12 (CYANOCOBALAMIN) 1000 MCG tablet Take 1 tablet (1,000 mcg  total) by mouth daily. 90 tablet 4   No current facility-administered medications for this visit.    PHYSICAL EXAMINATION:  ECOG PERFORMANCE STATUS: 0 - Asymptomatic Vitals:   10/02/19 1055  BP: 101/61  Pulse: 66  Resp: 20  Temp: 97.8 F (36.6 C)  SpO2: 100%    Filed Weights   10/02/19 1055  Weight: 120 lb 1.6 oz (54.5 kg)     Physical Exam Constitutional:      General: She is not in acute distress.    Appearance: She is not diaphoretic.     Comments: Thin built, she walks independantly  HENT:     Head: Normocephalic and atraumatic.     Nose: Nose normal.     Mouth/Throat:     Pharynx: No oropharyngeal exudate.  Eyes:      General: No scleral icterus.    Pupils: Pupils are equal, round, and reactive to light.  Neck:     Vascular: No JVD.  Cardiovascular:     Rate and Rhythm: Normal rate and regular rhythm.     Heart sounds: No murmur heard.   Pulmonary:     Effort: Pulmonary effort is normal. No respiratory distress.     Breath sounds: Normal breath sounds. No rales.  Chest:     Chest wall: No tenderness.  Abdominal:     General: Bowel sounds are normal. There is no distension.     Palpations: Abdomen is soft.     Tenderness: There is no abdominal tenderness.  Musculoskeletal:        General: Normal range of motion.     Cervical back: Normal range of motion and neck supple.  Lymphadenopathy:     Cervical: No cervical adenopathy.  Skin:    General: Skin is warm and dry.     Findings: No erythema.     Comments: Right anterior medi port +   Neurological:     Mental Status: She is alert and oriented to person, place, and time.     Cranial Nerves: No cranial nerve deficit.     Motor: No abnormal muscle tone.     Coordination: Coordination normal.  Psychiatric:        Mood and Affect: Affect normal.        Judgment: Judgment normal.      LABORATORY DATA: I have personally reviewed the data as listed: CBC    Component Value Date/Time   WBC 3.1 (L) 09/29/2019 1015   RBC 2.76 (L) 09/29/2019 1015   HGB 11.1 (L) 09/29/2019 1015   HCT 31.7 (L) 09/29/2019 1015   PLT 120 (L) 09/29/2019 1015   MCV 114.9 (H) 09/29/2019 1015   MCH 40.2 (H) 09/29/2019 1015   MCHC 35.0 09/29/2019 1015   RDW 14.4 09/29/2019 1015   LYMPHSABS 0.8 09/29/2019 1015   MONOABS 0.3 09/29/2019 1015   EOSABS 0.1 09/29/2019 1015   BASOSABS 0.0 09/29/2019 1015   CMP Latest Ref Rng & Units 09/29/2019 09/03/2019 08/04/2019  Glucose 70 - 99 mg/dL 101(H) 89 100(H)  BUN 8 - 23 mg/dL '9 12 12  ' Creatinine 0.44 - 1.00 mg/dL 0.81 0.72 0.75  Sodium 135 - 145 mmol/L 132(L) 131(L) 133(L)  Potassium 3.5 - 5.1 mmol/L 4.3 3.8 4.0   Chloride 98 - 111 mmol/L 99 101 100  CO2 22 - 32 mmol/L '24 23 24  ' Calcium 8.9 - 10.3 mg/dL 9.9 9.6 9.8  Total Protein 6.5 - 8.1 g/dL 7.5 7.2 7.5  Total Bilirubin 0.3 - 1.2  mg/dL 0.8 0.8 0.8  Alkaline Phos 38 - 126 U/L 63 60 60  AST 15 - 41 U/L 16 14(L) 15  ALT 0 - 44 U/L '10 10 10    ' Pathology 11/16/2016 Surgical Pathology  CASE: [id number]  PATIENT: Katie Hill  Surgical Pathology Report   SPECIMEN SUBMITTED:  A. Retroperitoneal adenopathy, left  DIAGNOSIS:  A. LYMPH NODE, LEFT RETROPERITONEAL; CT-GUIDED CORE BIOPSY:  - METASTATIC HIGH-GRADE SEROUS CARCINOMA.  Pathology 03/14/2017   DIAGNOSIS:  A. OMENTUM; OMENTECTOMY:  - NO TUMOR SEEN.  - ONE NEGATIVE LYMPH NODE (0/1).   B. RIGHT FALLOPIAN TUBE AND OVARY; SALPINGO-OOPHORECTOMY:  - SMALL FOCI OF HIGH GRADE SEROUS CARCINOMA INVOLVING THE OVARY.  - MARKED THERAPY RELATED CHANGE.  - NO TUMOR SEEN IN THE FALLOPIAN TUBE.   C. UTERUS, CERVIX, LEFT FALLOPIAN TUBE AND OVARY; HYSTERECTOMY AND LEFT  SALPINGO-OOPHORECTOMY:  - NABOTHIAN CYSTS.  - CYSTIC ATROPHY OF THE ENDOMETRIUM.  - SEROSAL ADHESIONS.  - UNREMARKABLE FALLOPIAN TUBE.  - OVARY SHOWING TREATMENT RELATED CHANGE.   D. 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    RADIOGRAPHIC STUDIES: I have personally reviewed the radiological images as listed and agreed with the findings in the report. CT Chest W Contrast  Result Date: 09/29/2019 CLINICAL DATA:  Follow-up ovarian cancer, diagnosed in 2018 history of prior chemotherapy. EXAM: CT CHEST, ABDOMEN, AND PELVIS WITH CONTRAST TECHNIQUE: Multidetector CT imaging of the chest, abdomen and pelvis was performed following the standard protocol during bolus administration of intravenous contrast. CONTRAST:  66m OMNIPAQUE IOHEXOL 300 MG/ML  SOLN COMPARISON:  06/26/2019 FINDINGS: CT CHEST FINDINGS Cardiovascular: RIGHT-sided Port-A-Cath  terminates at the distal aspect of the superior vena cava. Aortic caliber is normal. Heart size is normal. No pericardial effusion. Venous phase appearance of central pulmonary vasculature is unremarkable. Mediastinum/Nodes: Thoracic inlet structures are normal. No axillary lymphadenopathy. No mediastinal lymphadenopathy. No hilar lymphadenopathy. No retrocrural adenopathy. Lungs/Pleura: Biapical pleural and parenchymal scarring similar to the prior study. No consolidation. No pleural effusion. Small nodules at the RIGHT lung base are unchanged largest on image 143 of series 3 measuring approximately 4 mm. 5 mm LEFT lower lobe pulmonary nodule (image 104, series 3) not changed. Airways are patent.  No new pulmonary nodule. Musculoskeletal: See below for full musculoskeletal details. CT ABDOMEN PELVIS FINDINGS Hepatobiliary: No focal, suspicious hepatic lesion. Gallbladder is normal. No biliary ductal dilation. Pancreas: Pancreas is normal without ductal dilation or inflammation. Spleen: Spleen normal size without focal lesion. Adrenals/Urinary Tract: Adrenal glands are normal. Kidneys enhance symmetrically. No hydronephrosis. No suspicious renal lesion. Small cyst in the lower pole of the RIGHT kidney. Urinary bladder is under distended limiting assessment. Stomach/Bowel: No sign of bowel obstruction. No acute bowel process. Vascular/Lymphatic: Vascular structures in the abdomen are patent without significant atherosclerotic change. Retroperitoneal adenopathy, intra-aortocaval and retrocaval lymph nodes (image 73, series 2) largest at 2.0 cm short axis previously approximately 1.6 cm this is the more medial of the 2 largest lymph nodes. The the more lateral retrocaval node measures 1.6 cm today previously 1.9 cm short axis. A smaller lymph node on image 77 of series 2 inferior to both of these lymph nodes measures 1.1 cm. This is unchanged. No sign of pelvic adenopathy. Reproductive: Post hysterectomy. Mass along  small bowel loops in the RIGHT hemipelvis measuring 3.6 x 1.5 cm, previously 3.6 x 1.7 cm. Other: No ascites. No peritoneal nodule aside from previously mention disease in the pelvis. Musculoskeletal: No  acute bone finding. No destructive bone process. IMPRESSION: 1. On balance, stable disease in the retroperitoneum with enlargement of the smaller of the 2 lymph nodes on the prior study and decrease in size of the largest lymph node seen on the previous exam. 2. Mass along small bowel loops in the RIGHT hemipelvis measuring 3.6 x 1.5 cm, previously 3.6 x 1.7 cm. 3. Stable small nodules at the RIGHT lung base. 4. No new pulmonary nodule. 5. Aortic atherosclerosis. Aortic Atherosclerosis (ICD10-I70.0). Electronically Signed   By: Zetta Bills M.D.   On: 09/29/2019 14:52   CT Abdomen Pelvis W Contrast  Result Date: 09/29/2019 CLINICAL DATA:  Follow-up ovarian cancer, diagnosed in 2018 history of prior chemotherapy. EXAM: CT CHEST, ABDOMEN, AND PELVIS WITH CONTRAST TECHNIQUE: Multidetector CT imaging of the chest, abdomen and pelvis was performed following the standard protocol during bolus administration of intravenous contrast. CONTRAST:  24m OMNIPAQUE IOHEXOL 300 MG/ML  SOLN COMPARISON:  06/26/2019 FINDINGS: CT CHEST FINDINGS Cardiovascular: RIGHT-sided Port-A-Cath terminates at the distal aspect of the superior vena cava. Aortic caliber is normal. Heart size is normal. No pericardial effusion. Venous phase appearance of central pulmonary vasculature is unremarkable. Mediastinum/Nodes: Thoracic inlet structures are normal. No axillary lymphadenopathy. No mediastinal lymphadenopathy. No hilar lymphadenopathy. No retrocrural adenopathy. Lungs/Pleura: Biapical pleural and parenchymal scarring similar to the prior study. No consolidation. No pleural effusion. Small nodules at the RIGHT lung base are unchanged largest on image 143 of series 3 measuring approximately 4 mm. 5 mm LEFT lower lobe pulmonary nodule  (image 104, series 3) not changed. Airways are patent.  No new pulmonary nodule. Musculoskeletal: See below for full musculoskeletal details. CT ABDOMEN PELVIS FINDINGS Hepatobiliary: No focal, suspicious hepatic lesion. Gallbladder is normal. No biliary ductal dilation. Pancreas: Pancreas is normal without ductal dilation or inflammation. Spleen: Spleen normal size without focal lesion. Adrenals/Urinary Tract: Adrenal glands are normal. Kidneys enhance symmetrically. No hydronephrosis. No suspicious renal lesion. Small cyst in the lower pole of the RIGHT kidney. Urinary bladder is under distended limiting assessment. Stomach/Bowel: No sign of bowel obstruction. No acute bowel process. Vascular/Lymphatic: Vascular structures in the abdomen are patent without significant atherosclerotic change. Retroperitoneal adenopathy, intra-aortocaval and retrocaval lymph nodes (image 73, series 2) largest at 2.0 cm short axis previously approximately 1.6 cm this is the more medial of the 2 largest lymph nodes. The the more lateral retrocaval node measures 1.6 cm today previously 1.9 cm short axis. A smaller lymph node on image 77 of series 2 inferior to both of these lymph nodes measures 1.1 cm. This is unchanged. No sign of pelvic adenopathy. Reproductive: Post hysterectomy. Mass along small bowel loops in the RIGHT hemipelvis measuring 3.6 x 1.5 cm, previously 3.6 x 1.7 cm. Other: No ascites. No peritoneal nodule aside from previously mention disease in the pelvis. Musculoskeletal: No acute bone finding. No destructive bone process. IMPRESSION: 1. On balance, stable disease in the retroperitoneum with enlargement of the smaller of the 2 lymph nodes on the prior study and decrease in size of the largest lymph node seen on the previous exam. 2. Mass along small bowel loops in the RIGHT hemipelvis measuring 3.6 x 1.5 cm, previously 3.6 x 1.7 cm. 3. Stable small nodules at the RIGHT lung base. 4. No new pulmonary nodule. 5. Aortic  atherosclerosis. Aortic Atherosclerosis (ICD10-I70.0). Electronically Signed   By: GZetta BillsM.D.   On: 09/29/2019 14:52    ASSESSMENT/PLAN Cancer Staging Malignant neoplasm of ovary ([facility] Staging form: Ovary, Fallopian Tube, and  Primary Peritoneal Carcinoma, AJCC 8th Edition - Clinical stage from 11/22/2016: Stage IIIC (cT3c, cN1b, cM0) - Signed by Earlie Server, MD on 11/22/2016  1. High grade ovarian cancer ([place])   2. Port-A-Cath in place   3. Thrombocytopenia ([place])   4. Anemia associated with chemotherapy   5. Encounter for antineoplastic chemotherapy   : # Ovarian Cancer, local recurrence.  Currently on olaparib 300 mg twice daily. She tolerates well.  CA 125 18.7-->61.2-->111-->90.5-->69--> 54.8--> 29.1-->47-->71.2-->67.7->73.2-->82.8-->70.4-->66.4 Labs are reviewed and discussed with patient. CA-125 is trending down. CT scan was independently reviewed by me and discussed with patient.  She has stable disease.  Mass along small bowel loops in the right hemipelvis measuring 3.6 x 1.5 cm is stable.  2 retroperitoneal lymph nodes are stable in size.  No new lung nodules. I recommend patient to continue olaparib 300 mg twice daily.  . 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  Hemoglobin is stable at 11.1.  Thrombocytopenia, grade 1,  Port flush every 6 to 8 weeks. RTF 4 weeks  Orders Placed This Encounter  Procedures  . CBC with Differential/Platelet    Standing Status:   Future    Standing Expiration Date:   10/01/2020  . Comprehensive metabolic panel    Standing Status:   Future    Standing Expiration Date:   10/01/2020  . CA 125    Standing Status:   Future    Standing Expiration Date:   10/01/2020    Earlie Server, MD, PhD Hematology Oncology [facility] Pager- [phone] 10/02/19

## 2019-10-28 ENCOUNTER — Inpatient Hospital Stay: Payer: Medicare Other | Attending: Oncology

## 2019-10-28 ENCOUNTER — Other Ambulatory Visit: Payer: Self-pay

## 2019-10-28 DIAGNOSIS — D6481 Anemia due to antineoplastic chemotherapy: Secondary | ICD-10-CM | POA: Insufficient documentation

## 2019-10-28 DIAGNOSIS — N888 Other specified noninflammatory disorders of cervix uteri: Secondary | ICD-10-CM | POA: Insufficient documentation

## 2019-10-28 DIAGNOSIS — D696 Thrombocytopenia, unspecified: Secondary | ICD-10-CM | POA: Diagnosis not present

## 2019-10-28 DIAGNOSIS — C561 Malignant neoplasm of right ovary: Secondary | ICD-10-CM | POA: Diagnosis not present

## 2019-10-28 DIAGNOSIS — Z79899 Other long term (current) drug therapy: Secondary | ICD-10-CM | POA: Diagnosis not present

## 2019-10-28 DIAGNOSIS — D7589 Other specified diseases of blood and blood-forming organs: Secondary | ICD-10-CM | POA: Insufficient documentation

## 2019-10-28 DIAGNOSIS — I700 Atherosclerosis of aorta: Secondary | ICD-10-CM | POA: Diagnosis not present

## 2019-10-28 DIAGNOSIS — R918 Other nonspecific abnormal finding of lung field: Secondary | ICD-10-CM | POA: Insufficient documentation

## 2019-10-28 DIAGNOSIS — T451X5A Adverse effect of antineoplastic and immunosuppressive drugs, initial encounter: Secondary | ICD-10-CM | POA: Diagnosis not present

## 2019-10-28 DIAGNOSIS — C569 Malignant neoplasm of unspecified ovary: Secondary | ICD-10-CM

## 2019-10-28 DIAGNOSIS — Z95828 Presence of other vascular implants and grafts: Secondary | ICD-10-CM

## 2019-10-28 LAB — CBC WITH DIFFERENTIAL/PLATELET
Abs Immature Granulocytes: 0.01 10*3/uL (ref 0.00–0.07)
Basophils Absolute: 0 10*3/uL (ref 0.0–0.1)
Basophils Relative: 1 %
Eosinophils Absolute: 0 10*3/uL (ref 0.0–0.5)
Eosinophils Relative: 1 %
HCT: 29.6 % — ABNORMAL LOW (ref 36.0–46.0)
Hemoglobin: 10.4 g/dL — ABNORMAL LOW (ref 12.0–15.0)
Immature Granulocytes: 0 %
Lymphocytes Relative: 29 %
Lymphs Abs: 0.9 10*3/uL (ref 0.7–4.0)
MCH: 39.5 pg — ABNORMAL HIGH (ref 26.0–34.0)
MCHC: 35.1 g/dL (ref 30.0–36.0)
MCV: 112.5 fL — ABNORMAL HIGH (ref 80.0–100.0)
Monocytes Absolute: 0.3 10*3/uL (ref 0.1–1.0)
Monocytes Relative: 8 %
Neutro Abs: 2 10*3/uL (ref 1.7–7.7)
Neutrophils Relative %: 61 %
Platelets: 128 10*3/uL — ABNORMAL LOW (ref 150–400)
RBC: 2.63 MIL/uL — ABNORMAL LOW (ref 3.87–5.11)
RDW: 14.2 % (ref 11.5–15.5)
WBC: 3.2 10*3/uL — ABNORMAL LOW (ref 4.0–10.5)
nRBC: 0 % (ref 0.0–0.2)

## 2019-10-28 LAB — COMPREHENSIVE METABOLIC PANEL
ALT: 9 U/L (ref 0–44)
AST: 14 U/L — ABNORMAL LOW (ref 15–41)
Albumin: 4.5 g/dL (ref 3.5–5.0)
Alkaline Phosphatase: 59 U/L (ref 38–126)
Anion gap: 7 (ref 5–15)
BUN: 13 mg/dL (ref 8–23)
CO2: 24 mmol/L (ref 22–32)
Calcium: 9.6 mg/dL (ref 8.9–10.3)
Chloride: 103 mmol/L (ref 98–111)
Creatinine, Ser: 0.78 mg/dL (ref 0.44–1.00)
GFR calc Af Amer: >60 mL/min (ref >60)
GFR calc non Af Amer: >60 mL/min (ref >60)
Glucose, Bld: 92 mg/dL (ref 70–99)
Potassium: 3.9 mmol/L (ref 3.5–5.1)
Sodium: 134 mmol/L — ABNORMAL LOW (ref 135–145)
Total Bilirubin: 0.9 mg/dL (ref 0.3–1.2)
Total Protein: 7.1 g/dL (ref 6.5–8.1)

## 2019-10-28 MED ORDER — HEPARIN SOD (PORK) LOCK FLUSH 100 UNIT/ML IV SOLN
INTRAVENOUS | Status: AC
  Filled 2019-10-28: qty 5

## 2019-10-28 MED ORDER — HEPARIN SOD (PORK) LOCK FLUSH 100 UNIT/ML IV SOLN
500.0000 [IU] | Freq: Once | INTRAVENOUS | Status: AC
  Administered 2019-10-28: 500 [IU] via INTRAVENOUS
  Filled 2019-10-28: qty 5

## 2019-10-28 MED ORDER — SODIUM CHLORIDE 0.9% FLUSH
10.0000 mL | Freq: Once | INTRAVENOUS | Status: AC
  Administered 2019-10-28: 10 mL via INTRAVENOUS
  Filled 2019-10-28: qty 10

## 2019-10-29 LAB — CA 125: Cancer Antigen (CA) 125: 58.4 U/mL — ABNORMAL HIGH (ref 0.0–38.1)

## 2019-10-30 ENCOUNTER — Inpatient Hospital Stay (HOSPITAL_BASED_OUTPATIENT_CLINIC_OR_DEPARTMENT_OTHER): Payer: Medicare Other | Admitting: Oncology

## 2019-10-30 ENCOUNTER — Other Ambulatory Visit: Payer: Self-pay

## 2019-10-30 ENCOUNTER — Encounter: Payer: Self-pay | Admitting: Oncology

## 2019-10-30 VITALS — BP 99/50 | HR 62 | Temp 98.3°F | Resp 18 | Wt 120.2 lb

## 2019-10-30 DIAGNOSIS — Z5111 Encounter for antineoplastic chemotherapy: Secondary | ICD-10-CM | POA: Diagnosis not present

## 2019-10-30 DIAGNOSIS — R918 Other nonspecific abnormal finding of lung field: Secondary | ICD-10-CM | POA: Diagnosis not present

## 2019-10-30 DIAGNOSIS — D696 Thrombocytopenia, unspecified: Secondary | ICD-10-CM

## 2019-10-30 DIAGNOSIS — Z95828 Presence of other vascular implants and grafts: Secondary | ICD-10-CM | POA: Diagnosis not present

## 2019-10-30 DIAGNOSIS — C561 Malignant neoplasm of right ovary: Secondary | ICD-10-CM | POA: Diagnosis not present

## 2019-10-30 DIAGNOSIS — D6481 Anemia due to antineoplastic chemotherapy: Secondary | ICD-10-CM | POA: Diagnosis not present

## 2019-10-30 DIAGNOSIS — C569 Malignant neoplasm of unspecified ovary: Secondary | ICD-10-CM | POA: Diagnosis not present

## 2019-10-30 DIAGNOSIS — T451X5A Adverse effect of antineoplastic and immunosuppressive drugs, initial encounter: Secondary | ICD-10-CM | POA: Diagnosis not present

## 2019-10-30 DIAGNOSIS — N888 Other specified noninflammatory disorders of cervix uteri: Secondary | ICD-10-CM | POA: Diagnosis not present

## 2019-10-30 DIAGNOSIS — I700 Atherosclerosis of aorta: Secondary | ICD-10-CM | POA: Diagnosis not present

## 2019-10-30 NOTE — Progress Notes (Signed)
[facility] Follow up visit  Patient Care Team: Patient, [name], [name], [name], [name], [name] She underwent  Neoadjuvant chemotherapy of carbo and taxol x 4  # Patient had debulking surgery on 03/14/2017. She had a laparoscopy with conversion to laparotomy, total hysterectomy, with bilateral salpingo oophorectomy, right aortic lymph node dissection, omentectomy. Pathology showed small foci of residual disease in ovary.   Genetic testing negative for83 genes on [facility]'s Multi-Cancer panel (ALK, APC, ATM, AXIN2, BAP1, BARD1, BLM, BMPR1A, BRCA1, BRCA2, BRIP1, CASR, CDC73, CDH1, CDK4, CDKN1B, CDKN1C, CDKN2A, CEBPA, CHEK2, CTNNA1, DICER1, DIS3L2, EGFR, EPCAM, FH, FLCN, GATA2, GPC3, GREM1, HOXB13, HRAS, KIT, MAX, MEN1, MET, MITF, MLH1, MSH2, MSH3, MSH6, MUTYH, NBN, NF1, NF2, NTHL1, PALB2, PDGFRA, PHOX2B, PMS2, POLD1, POLE, POT1, PRKAR1A, PTCH1, PTEN, RAD50, RAD51C, RAD51D, RB1, RECQL4, RET, RUNX1, SDHA, SDHAF2, SDHB, SDHC, SDHD, SMAD4, SMARCA4, SMARCB1, SMARCE1, STK11, SUFU, TERC, TERT, TMEM127, TP53, TSC1, TSC2, VHL, WRN, WT1).  A Variant of UncertainSignificancewas detected: CASRc.106G>A (p.Gly36Arg). Myraid testing negative for somatic BRACA1/2, positive for HRD.   # HRD positive, was referred to [facility] for clinical trials of Olarparib maintenance. She opted out.  #  Treatment:  Stage IIIC Ovarian cancer:  #s/p Carboplatin and taxol x 4 neoadjuvant chemotherapy followed by debulking surgery  03/14/2017 # 03/28/2017 S/p Adjuvant carbo and taxol x3   Local recurrence, CA125 one 46.3 03/15/2018-05/17/2018 Carboplatin and Taxol x 4. CA125 decrease from 49.7 to 6 after 4 cycles of treatment.  Started on Olarparib 373m BID on 05/17/2018.  # was seen by Dr. BFransisca Connorsfor further evaluation due to rising Ca1 25 and MRI abdomenPelvis on 11/05/2018 showed Interval progression of, now measuring 2.7 x 2.3 cm and concerning for progression of metastatic disease.retrocaval lymph node in the abdomen Olaparib was held for short period of time. Her CA125 trended down again.  Dr. BFransisca Connorsrecommending resume olaparib and continue monitor. If she has more significant progression of disease she may be a candidate for clinical trial or will be treated with other chemotherapy drugs including Doxil, gemcitabine and Avastin.   INTERVAL HISTORY 69y.o. female with above oncology history reviewed by me today presents for evaluation for chemotherapy for treatment of ovarian cancer  Patient was accompanied by her husband.she has no new complaints. Doing well. Appetite is good.   Review of Systems  Constitutional: Negative for appetite change, chills, fatigue and fever.  HENT:   Negative for hearing loss and voice change.   Eyes: Negative for eye problems.  Respiratory: Negative for chest tightness and cough.   Cardiovascular: Negative for chest pain.  Gastrointestinal: Negative for abdominal distention, abdominal pain and blood in stool.  Endocrine: Negative for hot flashes.  Genitourinary: Negative for bladder incontinence, difficulty urinating, frequency and hematuria.   Musculoskeletal: Negative for arthralgias.  Skin: Negative for itching and rash.  Neurological: Negative for extremity weakness.  Hematological: Negative for adenopathy.  Psychiatric/Behavioral: Negative for confusion.   MEDICAL HISTORY: Past Medical History:  Diagnosis Date  . Dysrhythmia   . Genetic testing 03/28/2017    Multi-Cancer panel (83 genes) @  [facility] - No pathogenic mutations detected  . High grade ovarian cancer ([place]) 11/20/2016  . Pelvic mass in female     SURGICAL HISTORY: Past Surgical History:  Procedure Laterality Date  . APPENDECTOMY    . LAPAROSCOPY N/A 03/14/2017   Procedure: LAPAROSCOPY OPERATIVE;  Surgeon: Mellody Drown, MD;  Location: [facility];  Service: Gynecology;  Laterality: N/A;  . LAPAROTOMY N/A 03/14/2017   Procedure: LAPAROTOMY;  Surgeon: Mellody Drown, MD;  Location: [facility];  Service: Gynecology;  Laterality: N/A;  . LYMPH NODE DISSECTION N/A 03/14/2017   Procedure: LYMPH NODE DISSECTION;  Surgeon: Mellody Drown, MD;  Location: [facility];  Service: Gynecology;  Laterality: N/A;  . OMENTECTOMY N/A 03/14/2017   Procedure: OMENTECTOMY;  Surgeon: Mellody Drown, MD;  Location: [facility];  Service: Gynecology;  Laterality: N/A;  . PORTA CATH INSERTION N/A 11/27/2016   Procedure: Glori Luis Cath Insertion;  Surgeon: Algernon Huxley, MD;  Location: [facility];  Service: Cardiovascular;  Laterality: N/A;    SOCIAL HISTORY: Social History   Socioeconomic History  . Marital status: Married    Spouse name: Not on file  . Number of children: Not on file  . Years of education: Not on file  . Highest education level: Not on file  Occupational History  . Not on file  Tobacco Use  . Smoking status: Never Smoker  . Smokeless tobacco: Never Used  Vaping Use  . Vaping Use: Never used  Substance and Sexual Activity  . Alcohol use: Not Currently  . Drug use: No  . Sexual activity: Yes    Birth control/protection: Post-menopausal  Other Topics Concern  . Not on file  Social History Narrative  . Not on file   Social Determinants of Health   Financial Resource Strain:   . Difficulty of Paying Living Expenses:   Food Insecurity:   . Worried About Charity fundraiser in the Last Year:   . Arboriculturist in the Last Year:   Transportation Needs:   . Lexicographer (Medical):   [name] Lack of Transportation (Non-Medical):   Physical Activity:   . Days of Exercise per Week:   . Minutes of Exercise per Session:   Stress:   . Feeling of Stress :   Social Connections:   . Frequency of Communication with Friends and Family:   . Frequency of Social Gatherings with Friends and Family:   . Attends Religious Services:   . Active Member of Clubs or Organizations:   . Attends Archivist Meetings:   [name] Marital Status:   Intimate Partner Violence:   . Fear of Current or Ex-Partner:   . Emotionally Abused:   [name] Physically Abused:   . Sexually Abused:     FAMILY HISTORY Family History  Problem Relation Age of Onset  . Throat cancer Cousin   . Throat cancer Cousin   . Leukemia Cousin     ALLERGIES:  is allergic to omeprazole.  MEDICATIONS:  Current Outpatient Medications  Medication Sig Dispense Refill  . lidocaine-prilocaine (EMLA) cream Apply 1 application topically as needed. Apply small amount to port site at least 1 hour prior to it being accessed, cover with plastic wrap 30 g 1  . LYNPARZA 150 MG tablet TAKE TWO TABLETS BY MOUTH TWICE A DAY 120 tablet 2  . vitamin B-12 (CYANOCOBALAMIN) 1000 MCG tablet Take 1 tablet (1,000 mcg total) by mouth daily. 90 tablet 4   No current facility-administered medications for  this visit.    PHYSICAL EXAMINATION:  ECOG PERFORMANCE STATUS: 0 - Asymptomatic Vitals:   10/30/19 1102  BP: (!) 99/50  Pulse: 62  Resp: 18  Temp: 98.3 F (36.8 C)    Filed Weights   10/30/19 1102  Weight: 120 lb 3.2 oz (54.5 kg)     Physical Exam Constitutional:      General: She is not in acute distress.    Appearance: She is not diaphoretic.     Comments: Thin built, she walks independantly  HENT:     Head: Normocephalic and atraumatic.     Nose: Nose normal.     Mouth/Throat:     Pharynx: No oropharyngeal exudate.  Eyes:     General: No scleral icterus.    Pupils: Pupils are equal, round,  and reactive to light.  Neck:     Vascular: No JVD.  Cardiovascular:     Rate and Rhythm: Normal rate and regular rhythm.     Heart sounds: No murmur heard.   Pulmonary:     Effort: Pulmonary effort is normal. No respiratory distress.     Breath sounds: Normal breath sounds. No rales.  Chest:     Chest wall: No tenderness.  Abdominal:     General: Bowel sounds are normal. There is no distension.     Palpations: Abdomen is soft.     Tenderness: There is no abdominal tenderness.  Musculoskeletal:        General: Normal range of motion.     Cervical back: Normal range of motion and neck supple.  Lymphadenopathy:     Cervical: No cervical adenopathy.  Skin:    General: Skin is warm and dry.     Findings: No erythema.     Comments: Right anterior medi port +   Neurological:     Mental Status: She is alert and oriented to person, place, and time.     Cranial Nerves: No cranial nerve deficit.     Motor: No abnormal muscle tone.     Coordination: Coordination normal.  Psychiatric:        Mood and Affect: Affect normal.        Judgment: Judgment normal.      LABORATORY DATA: I have personally reviewed the data as listed: CBC    Component Value Date/Time   WBC 3.2 (L) 10/28/2019 1414   RBC 2.63 (L) 10/28/2019 1414   HGB 10.4 (L) 10/28/2019 1414   HCT 29.6 (L) 10/28/2019 1414   PLT 128 (L) 10/28/2019 1414   MCV 112.5 (H) 10/28/2019 1414   MCH 39.5 (H) 10/28/2019 1414   MCHC 35.1 10/28/2019 1414   RDW 14.2 10/28/2019 1414   LYMPHSABS 0.9 10/28/2019 1414   MONOABS 0.3 10/28/2019 1414   EOSABS 0.0 10/28/2019 1414   BASOSABS 0.0 10/28/2019 1414   CMP Latest Ref Rng & Units 10/28/2019 09/29/2019 09/03/2019  Glucose 70 - 99 mg/dL 92 101(H) 89  BUN 8 - 23 mg/dL _0 Creatinine 0.44 - 1.00 mg/dL 0.78 0.81 0.72  Sodium 135 - 145 mmol/L 134(L) 132(L) 131(L)  Potassium 3.5 - 5.1 mmol/L 3.9 4.3 3.8  Chloride 98 - 111 mmol/L 103 99 101  CO2 22 - 32 mmol/L _1 Calcium 8.9  - 10.3 mg/dL 9.6 9.9 9.6  Total Protein 6.5 - 8.1 g/dL 7.1 7.5 7.2  Total Bilirubin 0.3 - 1.2 mg/dL 0.9 0.8 0.8  Alkaline Phos 38 - 126 U/L 59 63 60  AST  15 - 41 U/L 14(L) 16 14(L)  ALT 0 - 44 U/L _0 Pathology 11/16/2016 Surgical Pathology  CASE: [id number]  PATIENT: Katie Hill  Surgical Pathology Report   SPECIMEN SUBMITTED:  A. Retroperitoneal adenopathy, left  DIAGNOSIS:  A. LYMPH NODE, LEFT RETROPERITONEAL; CT-GUIDED CORE BIOPSY:  - METASTATIC HIGH-GRADE SEROUS CARCINOMA.  Pathology 03/14/2017   DIAGNOSIS:  A. OMENTUM; OMENTECTOMY:  - NO TUMOR SEEN.  - ONE NEGATIVE LYMPH NODE (0/1).   B. RIGHT FALLOPIAN TUBE AND OVARY; SALPINGO-OOPHORECTOMY:  - SMALL FOCI OF HIGH GRADE SEROUS CARCINOMA INVOLVING THE OVARY.  - MARKED THERAPY RELATED CHANGE.  - NO TUMOR SEEN IN THE FALLOPIAN TUBE.   C. UTERUS, CERVIX, LEFT FALLOPIAN TUBE AND OVARY; HYSTERECTOMY AND LEFT  SALPINGO-OOPHORECTOMY:  - NABOTHIAN CYSTS.  - CYSTIC ATROPHY OF THE ENDOMETRIUM.  - SEROSAL ADHESIONS.  - UNREMARKABLE FALLOPIAN TUBE.  - OVARY SHOWING TREATMENT RELATED CHANGE.   D. 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    RADIOGRAPHIC STUDIES: I have personally reviewed the radiological images as listed and agreed with the findings in the report. CT Chest W Contrast  Result Date: 09/29/2019 CLINICAL DATA:  Follow-up ovarian cancer, diagnosed in 2018 history of prior chemotherapy. EXAM: CT CHEST, ABDOMEN, AND PELVIS WITH CONTRAST TECHNIQUE: Multidetector CT imaging of the chest, abdomen and pelvis was performed following the standard protocol during bolus administration of intravenous contrast. CONTRAST:  11m OMNIPAQUE IOHEXOL 300 MG/ML  SOLN COMPARISON:  06/26/2019 FINDINGS: CT CHEST FINDINGS Cardiovascular: RIGHT-sided Port-A-Cath terminates at the distal aspect of the superior vena cava. Aortic caliber is normal.  Heart size is normal. No pericardial effusion. Venous phase appearance of central pulmonary vasculature is unremarkable. Mediastinum/Nodes: Thoracic inlet structures are normal. No axillary lymphadenopathy. No mediastinal lymphadenopathy. No hilar lymphadenopathy. No retrocrural adenopathy. Lungs/Pleura: Biapical pleural and parenchymal scarring similar to the prior study. No consolidation. No pleural effusion. Small nodules at the RIGHT lung base are unchanged largest on image 143 of series 3 measuring approximately 4 mm. 5 mm LEFT lower lobe pulmonary nodule (image 104, series 3) not changed. Airways are patent.  No new pulmonary nodule. Musculoskeletal: See below for full musculoskeletal details. CT ABDOMEN PELVIS FINDINGS Hepatobiliary: No focal, suspicious hepatic lesion. Gallbladder is normal. No biliary ductal dilation. Pancreas: Pancreas is normal without ductal dilation or inflammation. Spleen: Spleen normal size without focal lesion. Adrenals/Urinary Tract: Adrenal glands are normal. Kidneys enhance symmetrically. No hydronephrosis. No suspicious renal lesion. Small cyst in the lower pole of the RIGHT kidney. Urinary bladder is under distended limiting assessment. Stomach/Bowel: No sign of bowel obstruction. No acute bowel process. Vascular/Lymphatic: Vascular structures in the abdomen are patent without significant atherosclerotic change. Retroperitoneal adenopathy, intra-aortocaval and retrocaval lymph nodes (image 73, series 2) largest at 2.0 cm short axis previously approximately 1.6 cm this is the more medial of the 2 largest lymph nodes. The the more lateral retrocaval node measures 1.6 cm today previously 1.9 cm short axis. A smaller lymph node on image 77 of series 2 inferior to both of these lymph nodes measures 1.1 cm. This is unchanged. No sign of pelvic adenopathy. Reproductive: Post hysterectomy. Mass along small bowel loops in the RIGHT hemipelvis measuring 3.6 x 1.5 cm, previously 3.6 x 1.7  cm. Other: No ascites. No peritoneal nodule aside from previously mention disease in the pelvis. Musculoskeletal: No acute bone finding. No destructive bone process. IMPRESSION: 1. On balance, stable disease in the retroperitoneum  with enlargement of the smaller of the 2 lymph nodes on the prior study and decrease in size of the largest lymph node seen on the previous exam. 2. Mass along small bowel loops in the RIGHT hemipelvis measuring 3.6 x 1.5 cm, previously 3.6 x 1.7 cm. 3. Stable small nodules at the RIGHT lung base. 4. No new pulmonary nodule. 5. Aortic atherosclerosis. Aortic Atherosclerosis (ICD10-I70.0). Electronically Signed   By: Zetta Bills M.D.   On: 09/29/2019 14:52   CT Abdomen Pelvis W Contrast  Result Date: 09/29/2019 CLINICAL DATA:  Follow-up ovarian cancer, diagnosed in 2018 history of prior chemotherapy. EXAM: CT CHEST, ABDOMEN, AND PELVIS WITH CONTRAST TECHNIQUE: Multidetector CT imaging of the chest, abdomen and pelvis was performed following the standard protocol during bolus administration of intravenous contrast. CONTRAST:  46m OMNIPAQUE IOHEXOL 300 MG/ML  SOLN COMPARISON:  06/26/2019 FINDINGS: CT CHEST FINDINGS Cardiovascular: RIGHT-sided Port-A-Cath terminates at the distal aspect of the superior vena cava. Aortic caliber is normal. Heart size is normal. No pericardial effusion. Venous phase appearance of central pulmonary vasculature is unremarkable. Mediastinum/Nodes: Thoracic inlet structures are normal. No axillary lymphadenopathy. No mediastinal lymphadenopathy. No hilar lymphadenopathy. No retrocrural adenopathy. Lungs/Pleura: Biapical pleural and parenchymal scarring similar to the prior study. No consolidation. No pleural effusion. Small nodules at the RIGHT lung base are unchanged largest on image 143 of series 3 measuring approximately 4 mm. 5 mm LEFT lower lobe pulmonary nodule (image 104, series 3) not changed. Airways are patent.  No new pulmonary nodule.  Musculoskeletal: See below for full musculoskeletal details. CT ABDOMEN PELVIS FINDINGS Hepatobiliary: No focal, suspicious hepatic lesion. Gallbladder is normal. No biliary ductal dilation. Pancreas: Pancreas is normal without ductal dilation or inflammation. Spleen: Spleen normal size without focal lesion. Adrenals/Urinary Tract: Adrenal glands are normal. Kidneys enhance symmetrically. No hydronephrosis. No suspicious renal lesion. Small cyst in the lower pole of the RIGHT kidney. Urinary bladder is under distended limiting assessment. Stomach/Bowel: No sign of bowel obstruction. No acute bowel process. Vascular/Lymphatic: Vascular structures in the abdomen are patent without significant atherosclerotic change. Retroperitoneal adenopathy, intra-aortocaval and retrocaval lymph nodes (image 73, series 2) largest at 2.0 cm short axis previously approximately 1.6 cm this is the more medial of the 2 largest lymph nodes. The the more lateral retrocaval node measures 1.6 cm today previously 1.9 cm short axis. A smaller lymph node on image 77 of series 2 inferior to both of these lymph nodes measures 1.1 cm. This is unchanged. No sign of pelvic adenopathy. Reproductive: Post hysterectomy. Mass along small bowel loops in the RIGHT hemipelvis measuring 3.6 x 1.5 cm, previously 3.6 x 1.7 cm. Other: No ascites. No peritoneal nodule aside from previously mention disease in the pelvis. Musculoskeletal: No acute bone finding. No destructive bone process. IMPRESSION: 1. On balance, stable disease in the retroperitoneum with enlargement of the smaller of the 2 lymph nodes on the prior study and decrease in size of the largest lymph node seen on the previous exam. 2. Mass along small bowel loops in the RIGHT hemipelvis measuring 3.6 x 1.5 cm, previously 3.6 x 1.7 cm. 3. Stable small nodules at the RIGHT lung base. 4. No new pulmonary nodule. 5. Aortic atherosclerosis. Aortic Atherosclerosis (ICD10-I70.0). Electronically Signed    By: GZetta BillsM.D.   On: 09/29/2019 14:52    ASSESSMENT/PLAN Cancer Staging Malignant neoplasm of ovary ([facility] Staging form: Ovary, Fallopian Tube, and Primary Peritoneal Carcinoma, AJCC 8th Edition - Clinical stage from 11/22/2016: Stage IIIC (cT3c, cN1b, cM0) -  Signed by Earlie Server, MD on 11/22/2016  1. Port-A-Cath in place   2. High grade ovarian cancer ([place])   3. Thrombocytopenia ([place])   4. Anemia associated with chemotherapy   5. Encounter for antineoplastic chemotherapy   : # Ovarian Cancer, local recurrence.  Currently on olaparib 300 mg twice daily. She tolerates well.  CA 125 18.7-->61.2-->111-->90.5-->69--> 54.8--> 29.1-->47-->71.2-->67.7->73.2-->82.8-->70.4-->66.4--> 58.4 Labs are reviewed and discussed with patient. CA-125 is trending down.I recommend patient to continue olaparib 300 mg twice daily.  6/21/2021CT scan was independently reviewed by me and discussed with patient.  She has stable disease.  Mass along small bowel loops in the right hemipelvis measuring 3.6 x 1.5 cm is stable.  2 retroperitoneal lymph nodes are stable in size.  No new lung nodules.  . Chronic anemia, hemoglobin slightly lower than last visit.  10.4, stable. 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    Thrombocytopenia, grade 1, stable. Port flush every 6 to 8 weeks. RTF 4 weeks  Orders Placed This Encounter  Procedures  . CBC with Differential/Platelet    Standing Status:   Future    Standing Expiration Date:   10/29/2020  . Comprehensive metabolic panel    Standing Status:   Future    Standing Expiration Date:   10/29/2020  . CA 125    Standing Status:   Future    Standing Expiration Date:   10/29/2020    Earlie Server, MD, PhD Hematology Oncology [facility] Pager- [phone] 10/30/19

## 2019-10-30 NOTE — Progress Notes (Signed)
Pt here for follow. Denies any new concerns.

## 2019-11-19 ENCOUNTER — Inpatient Hospital Stay: Payer: Medicare Other

## 2019-11-26 ENCOUNTER — Other Ambulatory Visit: Payer: Self-pay

## 2019-11-26 ENCOUNTER — Inpatient Hospital Stay: Payer: Medicare Other | Attending: Obstetrics and Gynecology | Admitting: Obstetrics and Gynecology

## 2019-11-26 ENCOUNTER — Encounter (INDEPENDENT_AMBULATORY_CARE_PROVIDER_SITE_OTHER): Payer: Self-pay

## 2019-11-26 VITALS — BP 115/60 | HR 66 | Temp 98.7°F | Resp 20 | Wt 121.1 lb

## 2019-11-26 DIAGNOSIS — Z9221 Personal history of antineoplastic chemotherapy: Secondary | ICD-10-CM

## 2019-11-26 DIAGNOSIS — Z90722 Acquired absence of ovaries, bilateral: Secondary | ICD-10-CM | POA: Diagnosis not present

## 2019-11-26 DIAGNOSIS — Z90710 Acquired absence of both cervix and uterus: Secondary | ICD-10-CM | POA: Insufficient documentation

## 2019-11-26 DIAGNOSIS — D696 Thrombocytopenia, unspecified: Secondary | ICD-10-CM | POA: Diagnosis not present

## 2019-11-26 DIAGNOSIS — D649 Anemia, unspecified: Secondary | ICD-10-CM | POA: Insufficient documentation

## 2019-11-26 DIAGNOSIS — Z79899 Other long term (current) drug therapy: Secondary | ICD-10-CM | POA: Insufficient documentation

## 2019-11-26 DIAGNOSIS — C569 Malignant neoplasm of unspecified ovary: Secondary | ICD-10-CM | POA: Diagnosis not present

## 2019-11-26 DIAGNOSIS — D7589 Other specified diseases of blood and blood-forming organs: Secondary | ICD-10-CM | POA: Insufficient documentation

## 2019-11-26 NOTE — Progress Notes (Signed)
Gynecologic Oncology Interval Visit   Referring Provider: Dr. 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  Subjective:  Katie Hill is a 69 y.o. G0P0 female with locally recurrent advanced ovarian cancer, currently on olaparib since 2/20 with Dr Tasia Catchings, who presents to clinic for follow up.   She was diagnosed with high grade serous ovarian cancer (HRD) on CT directed node biopsy with partial bowel obstruction and extensive bulky adenopathy on 11/2016 and received 4 cycles of carbo-taxol with dramatic decline in CA 125 and improvement in imaging. She underwent interval debulking surgery to no gross residual disease on 03/14/2017 followed by re-initiation of chemotherapy on 03/28/17 with normalization of CA-125. She had a platinum sensitive recurrence and received carbo-taxol recurred and received carbo-taxol 03/15/2018-04/26/2018 followed by olaparib since 2/20 for somatic HRD positive malignancy. Initially CA 125 rose, then normalized.   In the interim, she was felt to have radiologic progression. Case was discussed at [facility] and recommendation was to continue parp inhibitor as she continued to be asymptomatic. We had discussed other options for treatment should she have clinical progression.    CA125 continues to stay with in a range without clear up trend. No significant symptoms.  CA125 10/28/19 = 58   07/25/19 82  3/21 76 2/21 67 04/14/19 47.0 02/12/20 29.1 12/18/2018 54.8  CT 09/29/19 IMPRESSION: 1. On balance, stable disease in the retroperitoneum with enlargement of the smaller of the 2 lymph nodes on the prior study and decrease in size of the largest lymph node seen on the previous exam. 2. Mass along small bowel loops in the RIGHT hemipelvis measuring 3.6 x 1.5 cm, previously 3.6 x 1.7 cm. 3. Stable small nodules at the RIGHT lung base. 4. No new pulmonary nodule.   Oncology History: Katie Hill is a pleasant. G0P0 female with  advanced ovarian cancer.  She presented to the ER 11/13/2016 with symptoms of right lower quadrant pain for 3-4 weeks, associated with nausea, bloating, no change in weight, urinary frequency, and change in bowel habits with more diffucult and stringy BM's.  CT CHEST, ABDOMEN, AND PELVIS WITH CONTRAST IMPRESSION: 1. Large heterogeneous central pelvic mass measures up to 12.6 cm. The lesion generates substantial mass-effect on the uterus, bladder, and pelvic sidewall anatomy. The mass appears to invade the mid sigmoid colon. Etiology of the central pelvic mass is not definitive by CT, but ovarian primary is suspected. The lesion becomes indistinguishable from the posterior uterus on some images and uterine origin is possible, but the uterus does not appear to be the epicenter of the mass and appears to be more displaced by it. MRI of the pelvis without and with contrast may prove helpful to better delineate the relationship of the uterus to the central pelvic mass although it may not be able to definitively localize the origin. 2. Bulky retroperitoneal and left pelvic sidewall lymphadenopathy. The retroperitoneal lymphadenopathy generates substantial mass-effect on the IVC and left renal vein. The external iliac veins along each pelvic sidewall are markedly attenuated by the mass/lymphadenopathy. Patency of these vessels cannot be definitely confirmed on this exam.  11/16/16 LYMPH NODE, LEFT RETROPERITONEAL; CT-GUIDED CORE BIOPSY:  - METASTATIC HIGH-GRADE SEROUS CARCINOMA.   Decision was made to do neoadjuvant carbo/taxol chemotherapy.  CA125 fell from 4,382 to 64.6 (10/29).  CT scan showed dramatic response.  CT scan 10/14 Vascular/Lymphatic: Normal appearance of the abdominal aorta. Interval decrease in size of previously identified bulky retroperitoneal adenopathy. Index aortocaval node measures  1.6 x 2.6 cm, image 32 of series 2. Previously 4.4 x 5.3 cm. Index left periaortic nodal mass measures 2.2  x 1.3 cm, image 31 of series 2. Previously 3.7 x 4.7 cm. At the level of the bifurcation there is a low-attenuation nodal mass which measure 3.1 x 2.6 cm, image 44 of series 2. Previously 4.3 x 4.8 cm. Left common iliac node measures 1.2 cm, image 53 of series 2. Previously 2.2 cm. Left external iliac node measures 1.6 x 1.1 cm, image 67 of series 2. Previously 4.8 x 2.6 cm. Reproductive: Large mass centered around the uterus measures 9.4 x 5.5 cm, image 67 of series 2. Previously this measured 12.6 x 9.2 cm  On 03/14/2017 she underwent L/S converted to XL, TAH, BSO, right PA node resection, omentectomy, and LOA to no residual disease.   Pathology 03/14/2017  DIAGNOSIS:  A. OMENTUM; OMENTECTOMY:  - NO TUMOR SEEN.  - ONE NEGATIVE LYMPH NODE (0/1).   B. RIGHT FALLOPIAN TUBE AND OVARY; SALPINGO-OOPHORECTOMY:  - SMALL FOCI OF HIGH GRADE SEROUS CARCINOMA INVOLVING THE OVARY.  - MARKED THERAPY RELATED CHANGE.  - NO TUMOR SEEN IN THE FALLOPIAN TUBE.   C. UTERUS, CERVIX, LEFT FALLOPIAN TUBE AND OVARY; HYSTERECTOMY AND LEFT  SALPINGO-OOPHORECTOMY:  - NABOTHIAN CYSTS.  - CYSTIC ATROPHY OF THE ENDOMETRIUM.  - SEROSAL ADHESIONS.  - UNREMARKABLE FALLOPIAN TUBE.  - OVARY SHOWING TREATMENT RELATED CHANGE.   D. 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   Then completed an additional 3 cycles of carbo/Taxol and CA 125 was 7.4 05/09/17.  CA 125  14 in 5/19  21 in 8/19.   CT scan 11/22/17 IMPRESSION: 1. Interval debulking surgery with decrease in retroperitoneal and extraperitoneal lymphadenopathy. In some areas the lymphadenopathy has resolved completely. Peritoneal implant seen along the falciform ligament on the prior study has decreased. 2. 4 x 2 cm collection of complex fluid or soft tissue in the right cul-de-sac is in the region of the complex mass lesion seen on the previous study. Given that there is some apparent  enhancement peripherally, residual/recurrent disease is a concern and close attention will be required. 3. Stable tiny bilateral pulmonary nodules with no pleural effusion.  CA125  25.7 12/24/17 46.3 02/18/18  02/28/2018- CT C/A/P  1. Compared to the prior examination there has been interval enlargement of an aortocaval lymph node which currently measures 1.4 cm in short axis (axial image 75 of series 2). This lymph node is in close proximity to the previously resected retroperitoneal lymphadenopathy and is very concerning for an additional nodal metastasis. 2. Other findings in the chest, abdomen and pelvis appear very similar to prior examinations, as discussed above. 3. Aortic atherosclerosis.  She received 3 cycles of carbo-Taxol 03/15/2018-04/26/2018. CA 125 improved to 6.0. She started olaparib 300 mg BID 2/20 for platinum-sensitive recurrent ovarian cancer (HRD positive).   CA125 04/05/2018    15.3 04/26/2018       6.3 05/17/2018         6.0 06/07/2018        6.9 06/21/2018 7.0 07/05/2018 8.4 08/02/2018 18.7 08/29/2018 61.2 (H) 09/23/2018  111.0 (H) 10/21/2018 90.5 (H)    11/04/18 MRI Abd/Pelvis IMPRESSION: 1. Interval progression of a retrocaval lymph node in the abdomen, now measuring 2.7 x 2.3 cm and concerning for progression of metastatic disease. 2. Aortocaval lymph node seen as decreased on the prior study is stable. This is unenlarged by MR criteria. 3. 3.3  x 1.6 cm homogeneous lesion posterior right pelvis is smaller than 02/28/2018. This is homogeneous and shows thin, smooth peripheral enhancement with no evidence for central enhancement and likely represents a chronic postoperative collection although metastatic disease not entirely excluded.  She continued on olaparib (she only stopped for about a 1/2 a day) and her CA125 values declined. She saw Dr. Tasia Catchings on 02/12/2019. She had chronic anemia and grade 1 thrombocytopenia, secondary to olaparib. Otherwise tolerated treatment very  well.   05/08/2019 CT C/A/P IMPRESSION: 1. Mild progression of metastatic disease. Mildly enlarged aortocaval lymph node is newly enlarged since 11/05/2018 MRI and 08/28/2018 CT studies. Separate enlarged retrocaval lymph node is stable since 11/05/2018 MRI and increased since 08/28/2018 CT. 2. Stable posterior right pelvic soft tissue focus, characterized as probable chronic postsurgical collection on prior MRI. 3.  Aortic Atherosclerosis (ICD10-I70.0).  Reviewed imaging 08/2018; 10/2018; 04/2019; Measurement vary on imaging reads. She does not have a baseline CT scan from 05/2018 1.Enlarged retrocaval lymph node measures 2.2 cm short axis diameter (series 2/image 29), compared to 2.2 cm 11/05/2018 MRI using similar measuring technique and 1.5 cm on 08/28/2018 CT. 2. Posterior pelvic 3.2 x 1.9 cm soft tissue focus just to the right of midline (series 2/image 64), previously 3.5 x 1.6 cm on 08/28/2018 CT. 3.3 x 1.6 cm  On MRI 10/2018. 3. New enlarged PA node 1.5 cm (series 2/image 29)  CT 3/21  IMPRESSION: 1. Previously noted retrocaval lymph node is stable, while the adjacent aortocaval lymph node has slightly increased in size compared to the prior examination. Previously noted soft tissue attenuation lesion in the posterior aspect of the anatomic pelvis appear stable compared to the prior study. No other new sites of metastatic disease are noted elsewhere in the chest, abdomen or pelvis. 2. Previously noted small pulmonary nodules are all stable in number and size compared to the prior examinations, favored to be benign. 3. Aortic atherosclerosis.   Genetic testing negative for 83 genes on [facility]'s Multi-Cancer panel (ALK, APC, ATM, AXIN2, BAP1, BARD1, BLM, BMPR1A, BRCA1, BRCA2, BRIP1, CASR, CDC73, CDH1, CDK4, CDKN1B, CDKN1C, CDKN2A, CEBPA, CHEK2, CTNNA1, DICER1, DIS3L2, EGFR, EPCAM, FH, FLCN, GATA2, GPC3, GREM1, HOXB13, HRAS, KIT, MAX, MEN1, MET, MITF, MLH1, MSH2, MSH3, MSH6, MUTYH, NBN, NF1,  NF2, NTHL1, PALB2, PDGFRA, PHOX2B, PMS2, POLD1, POLE, POT1, PRKAR1A, PTCH1, PTEN, RAD50, RAD51C, RAD51D, RB1, RECQL4, RET, RUNX1, SDHA, SDHAF2, SDHB, SDHC, SDHD, SMAD4, SMARCA4, SMARCB1, SMARCE1, STK11, SUFU, TERC, TERT, TMEM127, TP53, TSC1, TSC2, VHL, WRN, WT1).  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 This is still considered a normal result.   Somatic tumor testing:  She has HRD positive cancer, negative for somatic or germline BRCA mutation.   Patient Active Problem List   Diagnosis Date Noted  . Encounter for antineoplastic chemotherapy 04/14/2019  . Goals of care, counseling/discussion 03/09/2018  . Genetic testing 03/28/2017  . S/P total abdominal hysterectomy and bilateral salpingo-oophorectomy 03/14/2017  . Hyponatremia 12/12/2016  . Dehydration 12/08/2016  . Malignant neoplasm of ovary ([place]) 11/20/2016  . Pelvic mass 11/15/2016   Past Medical History:  Diagnosis Date  . Dysrhythmia   . Genetic testing 03/28/2017   Multi-Cancer panel (83 genes) @ [facility] - No pathogenic mutations detected  . High grade ovarian cancer ([place]) 11/20/2016  . Pelvic mass in female    Past Surgical History:  Procedure Laterality Date  . APPENDECTOMY    . LAPAROSCOPY N/A 03/14/2017   Procedure: LAPAROSCOPY OPERATIVE;  Surgeon: Mellody Drown, MD;  Location: [facility] OR[facility];  Service: Gynecology;  Laterality: N/A;  . LAPAROTOMY N/A 03/14/2017   Procedure: LAPAROTOMY;  Surgeon: Mellody Drown, MD;  Location: [facility] OR[facility];  Service: Gynecology;  Laterality: N/A;  . LYMPH NODE DISSECTION N/A 03/14/2017   Procedure: LYMPH NODE DISSECTION;  Surgeon: Mellody Drown, MD;  Location: [facility] OR[facility];  Service: Gynecology;  Laterality: N/A;  . OMENTECTOMY N/A 03/14/2017   Procedure: OMENTECTOMY;  Surgeon: Mellody Drown, MD;  Location: [facility];  Service: Gynecology;  Laterality: N/A;  . PORTA CATH INSERTION N/A 11/27/2016   Procedure: Glori Luis Cath Insertion;  Surgeon: Algernon Huxley,  MD;  Location: [place] [facility];  Service: Cardiovascular;  Laterality: N/A;   Past Gynecologic History:  Menarche: 17 Last Menstrual Period: 24 years ago History of Abnormal pap: no Last pap: years ago She does not have regular medical cancer and has not has screening mammogram, colonoscopy, or Pap smears. She has not had an abnormal Pap.   OB History    Gravida  0   Para  0   Term  0   Preterm  0   AB  0   Living  0     SAB  0   TAB  0   Ectopic  0   Multiple  0   Live Births  0          Family History  Problem Relation Age of Onset  . Throat cancer Cousin   . Throat cancer Cousin   . Leukemia Cousin    Social History   Socioeconomic History  . Marital status: Married    Spouse name: Not on file  . Number of children: Not on file  . Years of education: Not on file  . Highest education level: Not on file  Occupational History  . Not on file  Tobacco Use  . Smoking status: Never Smoker  . Smokeless tobacco: Never Used  Vaping Use  . Vaping Use: Never used  Substance and Sexual Activity  . Alcohol use: Not Currently  . Drug use: No  . Sexual activity: Yes    Birth control/protection: Post-menopausal  Other Topics Concern  . Not on file  Social History Narrative  . Not on file   Social Determinants of Health   Financial Resource Strain:   . Difficulty of Paying Living Expenses:   Food Insecurity:   . Worried About Charity fundraiser in the Last Year:   . Arboriculturist in the Last Year:   Transportation Needs:   . Film/video editor (Medical):   [name] Lack of Transportation (Non-Medical):   Physical Activity:   . Days of Exercise per Week:   . Minutes of Exercise per Session:   Stress:   . Feeling of Stress :   Social Connections:   . Frequency of Communication with Friends and Family:   . Frequency of Social Gatherings with Friends and Family:   . Attends Religious Services:   . Active Member of Clubs or Organizations:   .  Attends Archivist Meetings:   [name] Marital Status:    Allergies  Allergen Reactions  . Omeprazole Rash   Current Outpatient Medications on File Prior to Visit  Medication Sig Dispense Refill  . lidocaine-prilocaine (EMLA) cream Apply 1 application topically as needed. Apply small amount to port site at least 1 hour prior to it being accessed, cover with plastic wrap 30 g 1  . LYNPARZA 150 MG tablet TAKE TWO TABLETS  BY MOUTH TWICE A DAY 120 tablet 2  . vitamin B-12 (CYANOCOBALAMIN) 1000 MCG tablet Take 1 tablet (1,000 mcg total) by mouth daily. 90 tablet 4  . [DISCONTINUED] olaparib (LYNPARZA) 150 MG tablet Take 2 tablets (300 mg total) by mouth 2 (two) times daily. 120 tablet 2   No current facility-administered medications on file prior to visit.   Review of Systems General:  no complaints Skin: no complaints Eyes: no complaints HEENT: no complaints Breasts: no complaints Pulmonary: no complaints Cardiac: no complaints Gastrointestinal: no complaints Genitourinary/Sexual: no complaints Ob/Gyn: no complaints Musculoskeletal: no complaints Hematology: no complaints Neurologic/Psych: no complaints   Objective:  Physical Examination:  Today's Vitals   11/26/19 1128 11/26/19 1131  BP: 115/60   Pulse: 66   Resp: 20   Temp: 98.7 F (37.1 C)   SpO2: 100%   Weight: 121 lb 1.6 oz (54.9 kg)   PainSc:  0-No pain   Body mass index is 18.15 kg/m.  ECOG Performance Status: 1 - Symptomatic but completely ambulatory  GENERAL: Patient is a well appearing female in no acute distress HEENT:  Sclera clear. Anicteric NODES:  Negative axillary, supraclavicular, inguinal lymph node survery LUNGS:  Clear to auscultation bilaterally.   HEART:  Regular rate and rhythm.  ABDOMEN:  Soft, nontender.  No hernias, incisions well healed. No masses or ascites EXTREMITIES:  No peripheral edema. Atraumatic. No cyanosis SKIN:  Clear with no obvious rashes or skin changes.  NEURO:   Nonfocal. Well oriented.  Appropriate affect.  Pelvic: EGBUS: no lesions Cervix: surgically absent Vagina: no lesions, no discharge or bleeding Uterus: surgically absent Adnexa: no palpable masses Rectovaginal: performed and confirmatory. No pelvic masses palpated.   Lab Results  Component Value Date   WBC 3.2 (L) 10/28/2019   HGB 10.4 (L) 10/28/2019   HCT 29.6 (L) 10/28/2019   MCV 112.5 (H) 10/28/2019   PLT 128 (L) 10/28/2019    Assessment:  Katie Hill is a 70 y.o. female diagnosed with high grade serous ovarian cancer (HRD) on CT directed node biopsy with partial bowel obstruction and extensive bulky adenopathy 8/18. s/p 4 cycles of carbo/taxol chemotherapy with dramatic decline in CA125 and improvement on CT scan. Interval debulking surgery to no gross residual on 03/14/2017.  Reinitiation of chemotherapy on 03/28/2017 with normalization of CA125.  Platinum-sensitive recurrence with normalization with re-induction of carbo-Taxol 03/15/2018-04/26/2018. On olaparib since 2/20 for HRD positive cancer, with initial CA125 rise and then normalization CA125. CA125 now elevated in the 40-80 range on Olaparib with mild pancytopenia. CT scan 6/21 without clear evidence of progression.  Decision made to continue on with Olaparib under Dr Tasia Catchings until clear sign of progression.   She does not have a germline mutation in BRCA1/2 or other related gene, but somatic tumor testing is positive for HRD.    Medical co-morbidities complicating care: prior abdominal surgery.  Plan:   Problem List Items Addressed This Visit    None    Visit Diagnoses    High grade ovarian cancer ([place])    -  Primary     She will continue to see Dr. Tasia Catchings for evaluation on Olaparib. She is tolerating therapy well. CA125 trend and CT concerning for mild progressive disease, but not having significant symptoms.   Return to [place] clinic in 3 months.   If she has more significant progression of disease she would be  treated with other FDA approved drugs including Doxil, gemcitabine and Avastin.  Also could be  a candidate for clinical trial.  A total of over 25 minutes were spent with the patient/family today; >50% was spent in education, counseling and coordination of care for platinum-sensitive  high grade serous ovarian cancer (HRD) cancer.  Mellody Drown, MD  CC:  Gae Dry,  MD [address] [place], [place] [address]  Dr. Tasia Catchings

## 2019-12-02 ENCOUNTER — Inpatient Hospital Stay: Payer: Medicare Other

## 2019-12-02 ENCOUNTER — Other Ambulatory Visit: Payer: Self-pay

## 2019-12-02 DIAGNOSIS — D7589 Other specified diseases of blood and blood-forming organs: Secondary | ICD-10-CM | POA: Diagnosis not present

## 2019-12-02 DIAGNOSIS — D696 Thrombocytopenia, unspecified: Secondary | ICD-10-CM | POA: Diagnosis not present

## 2019-12-02 DIAGNOSIS — D649 Anemia, unspecified: Secondary | ICD-10-CM | POA: Diagnosis not present

## 2019-12-02 DIAGNOSIS — C569 Malignant neoplasm of unspecified ovary: Secondary | ICD-10-CM

## 2019-12-02 DIAGNOSIS — Z79899 Other long term (current) drug therapy: Secondary | ICD-10-CM | POA: Diagnosis not present

## 2019-12-02 DIAGNOSIS — Z9221 Personal history of antineoplastic chemotherapy: Secondary | ICD-10-CM | POA: Diagnosis not present

## 2019-12-02 LAB — CBC WITH DIFFERENTIAL/PLATELET
Abs Immature Granulocytes: 0.01 10*3/uL (ref 0.00–0.07)
Basophils Absolute: 0 10*3/uL (ref 0.0–0.1)
Basophils Relative: 1 %
Eosinophils Absolute: 0.1 10*3/uL (ref 0.0–0.5)
Eosinophils Relative: 2 %
HCT: 30.2 % — ABNORMAL LOW (ref 36.0–46.0)
Hemoglobin: 10.6 g/dL — ABNORMAL LOW (ref 12.0–15.0)
Immature Granulocytes: 0 %
Lymphocytes Relative: 29 %
Lymphs Abs: 0.8 10*3/uL (ref 0.7–4.0)
MCH: 40.5 pg — ABNORMAL HIGH (ref 26.0–34.0)
MCHC: 35.1 g/dL (ref 30.0–36.0)
MCV: 115.3 fL — ABNORMAL HIGH (ref 80.0–100.0)
Monocytes Absolute: 0.3 10*3/uL (ref 0.1–1.0)
Monocytes Relative: 9 %
Neutro Abs: 1.7 10*3/uL (ref 1.7–7.7)
Neutrophils Relative %: 59 %
Platelets: 125 10*3/uL — ABNORMAL LOW (ref 150–400)
RBC: 2.62 MIL/uL — ABNORMAL LOW (ref 3.87–5.11)
RDW: 14.8 % (ref 11.5–15.5)
WBC: 2.8 10*3/uL — ABNORMAL LOW (ref 4.0–10.5)
nRBC: 0 % (ref 0.0–0.2)

## 2019-12-02 LAB — COMPREHENSIVE METABOLIC PANEL
ALT: 10 U/L (ref 0–44)
AST: 15 U/L (ref 15–41)
Albumin: 4.5 g/dL (ref 3.5–5.0)
Alkaline Phosphatase: 65 U/L (ref 38–126)
Anion gap: 7 (ref 5–15)
BUN: 11 mg/dL (ref 8–23)
CO2: 26 mmol/L (ref 22–32)
Calcium: 9.8 mg/dL (ref 8.9–10.3)
Chloride: 101 mmol/L (ref 98–111)
Creatinine, Ser: 0.73 mg/dL (ref 0.44–1.00)
GFR calc Af Amer: >60 mL/min (ref >60)
GFR calc non Af Amer: >60 mL/min (ref >60)
Glucose, Bld: 99 mg/dL (ref 70–99)
Potassium: 4.6 mmol/L (ref 3.5–5.1)
Sodium: 134 mmol/L — ABNORMAL LOW (ref 135–145)
Total Bilirubin: 0.5 mg/dL (ref 0.3–1.2)
Total Protein: 7.3 g/dL (ref 6.5–8.1)

## 2019-12-03 LAB — CA 125: Cancer Antigen (CA) 125: 64.9 U/mL — ABNORMAL HIGH (ref 0.0–38.1)

## 2019-12-04 ENCOUNTER — Inpatient Hospital Stay (HOSPITAL_BASED_OUTPATIENT_CLINIC_OR_DEPARTMENT_OTHER): Payer: Medicare Other | Admitting: Oncology

## 2019-12-04 ENCOUNTER — Encounter: Payer: Self-pay | Admitting: Oncology

## 2019-12-04 ENCOUNTER — Other Ambulatory Visit: Payer: Self-pay

## 2019-12-04 VITALS — BP 104/63 | HR 62 | Temp 98.0°F | Resp 16 | Wt 121.0 lb

## 2019-12-04 DIAGNOSIS — Z9221 Personal history of antineoplastic chemotherapy: Secondary | ICD-10-CM | POA: Diagnosis not present

## 2019-12-04 DIAGNOSIS — D696 Thrombocytopenia, unspecified: Secondary | ICD-10-CM | POA: Diagnosis not present

## 2019-12-04 DIAGNOSIS — C569 Malignant neoplasm of unspecified ovary: Secondary | ICD-10-CM

## 2019-12-04 DIAGNOSIS — Z79899 Other long term (current) drug therapy: Secondary | ICD-10-CM | POA: Diagnosis not present

## 2019-12-04 DIAGNOSIS — Z95828 Presence of other vascular implants and grafts: Secondary | ICD-10-CM | POA: Diagnosis not present

## 2019-12-04 DIAGNOSIS — D649 Anemia, unspecified: Secondary | ICD-10-CM | POA: Diagnosis not present

## 2019-12-04 DIAGNOSIS — D7589 Other specified diseases of blood and blood-forming organs: Secondary | ICD-10-CM | POA: Diagnosis not present

## 2019-12-04 NOTE — Progress Notes (Signed)
[facility] Follow up visit  Patient Care Team: Patient, [name], [name], [name], [name], [name] She underwent  Neoadjuvant chemotherapy of carbo and taxol x 4  # Patient had debulking surgery on 03/14/2017. She had a laparoscopy with conversion to laparotomy, total hysterectomy, with bilateral salpingo oophorectomy, right aortic lymph node dissection, omentectomy. Pathology showed small foci of residual disease in ovary.   Genetic testing negative for83 genes on [facility]'s Multi-Cancer panel (ALK, APC, ATM, AXIN2, BAP1, BARD1, BLM, BMPR1A, BRCA1, BRCA2, BRIP1, CASR, CDC73, CDH1, CDK4, CDKN1B, CDKN1C, CDKN2A, CEBPA, CHEK2, CTNNA1, DICER1, DIS3L2, EGFR, EPCAM, FH, FLCN, GATA2, GPC3, GREM1, HOXB13, HRAS, KIT, MAX, MEN1, MET, MITF, MLH1, MSH2, MSH3, MSH6, MUTYH, NBN, NF1, NF2, NTHL1, PALB2, PDGFRA, PHOX2B, PMS2, POLD1, POLE, POT1, PRKAR1A, PTCH1, PTEN, RAD50, RAD51C, RAD51D, RB1, RECQL4, RET, RUNX1, SDHA, SDHAF2, SDHB, SDHC, SDHD, SMAD4, SMARCA4, SMARCB1, SMARCE1, STK11, SUFU, TERC, TERT, TMEM127, TP53, TSC1, TSC2, VHL, WRN, WT1).  A Variant of UncertainSignificancewas detected: CASRc.106G>A (p.Gly36Arg). Myraid testing negative for somatic BRACA1/2, positive for HRD.   # HRD positive, was referred to [facility] for clinical trials of Olarparib maintenance. She opted out.  #  Treatment:  Stage IIIC Ovarian cancer:  #s/p Carboplatin and taxol x 4 neoadjuvant chemotherapy followed by debulking surgery  03/14/2017 # 03/28/2017 S/p Adjuvant carbo and taxol x3   Local recurrence, CA125 one 46.3 03/15/2018-05/17/2018 Carboplatin and Taxol x 4. CA125 decrease from 49.7 to 6 after 4 cycles of treatment.  Started on Olarparib 336m BID on 05/17/2018.  # was seen by Dr. BFransisca Connorsfor further evaluation due to rising Ca1 25 and MRI abdomenPelvis on 11/05/2018 showed Interval progression of, now measuring 2.7 x 2.3 cm and concerning for progression of metastatic disease.retrocaval lymph node in the abdomen Olaparib was held for short period of time. Her CA125 trended down again.  Dr. BFransisca Connorsrecommending resume olaparib and continue monitor. If she has more significant progression of disease she may be a candidate for clinical trial or will be treated with other chemotherapy drugs including Doxil, gemcitabine and Avastin.   INTERVAL HISTORY 69y.o. female with above oncology history reviewed by me today presents for evaluation for chemotherapy for treatment of ovarian cancer  Patient was accompanied by her husband.  She has no new complaints.  Appetite is good.  Weight has been stable .  Patient is on olaparib and tolerates well. No fever, chills, abdominal pain.  Review of Systems  Constitutional: Negative for appetite change, chills, fatigue and fever.  HENT:   Negative for hearing loss and voice change.   Eyes: Negative for eye problems.  Respiratory: Negative for chest tightness and cough.   Cardiovascular: Negative for chest pain.  Gastrointestinal: Negative for abdominal distention, abdominal pain and blood in stool.  Endocrine: Negative for hot flashes.  Genitourinary: Negative for bladder incontinence, difficulty urinating, frequency and hematuria.   Musculoskeletal: Negative for arthralgias.  Skin: Negative for itching and rash.  Neurological: Negative for extremity weakness.  Hematological: Negative for adenopathy.  Psychiatric/Behavioral: Negative for confusion.   MEDICAL HISTORY: Past  Medical History:  Diagnosis Date  . Dysrhythmia   . Genetic testing 03/28/2017   Multi-Cancer panel (83 genes) @ [facility] - No pathogenic mutations detected  . High grade ovarian cancer ([place]) 11/20/2016  . Pelvic mass in female     SURGICAL HISTORY: Past Surgical History:  Procedure Laterality Date  . APPENDECTOMY    . LAPAROSCOPY N/A 03/14/2017   Procedure: LAPAROSCOPY OPERATIVE;  Surgeon: Mellody Drown, MD;  Location: [facility];  Service: Gynecology;  Laterality: N/A;  . LAPAROTOMY N/A 03/14/2017   Procedure: LAPAROTOMY;  Surgeon: Mellody Drown, MD;  Location: [facility];  Service: Gynecology;  Laterality: N/A;  . LYMPH NODE DISSECTION N/A 03/14/2017   Procedure: LYMPH NODE DISSECTION;  Surgeon: Mellody Drown, MD;  Location: [facility] OR[facility];  Service: Gynecology;  Laterality: N/A;  . OMENTECTOMY N/A 03/14/2017   Procedure: OMENTECTOMY;  Surgeon: Mellody Drown, MD;  Location: [facility];  Service: Gynecology;  Laterality: N/A;  . PORTA CATH INSERTION N/A 11/27/2016   Procedure: Glori Luis Cath Insertion;  Surgeon: Algernon Huxley, MD;  Location: [facility];  Service: Cardiovascular;  Laterality: N/A;    SOCIAL HISTORY: Social History   Tobacco Use  . Smoking status: Never Smoker  . Smokeless tobacco: Never Used  Vaping Use  . Vaping Use: Never used  Substance Use Topics  . Alcohol use: Not Currently  . Drug use: No     FAMILY HISTORY Family History  Problem Relation Age of Onset  . Throat cancer Cousin   . Throat cancer Cousin   . Leukemia Cousin     ALLERGIES:  is allergic to omeprazole.  MEDICATIONS:  Current Outpatient Medications  Medication Sig Dispense Refill  . lidocaine-prilocaine (EMLA) cream Apply 1 application topically as needed. Apply small amount to port site at least 1 hour prior to it being accessed, cover with plastic wrap 30 g 1  . LYNPARZA 150 MG tablet TAKE TWO TABLETS BY MOUTH TWICE A DAY 120 tablet 2  . vitamin B-12 (CYANOCOBALAMIN) 1000 MCG  tablet Take 1 tablet (1,000 mcg total) by mouth daily. 90 tablet 4   No current facility-administered medications for this visit.    PHYSICAL EXAMINATION:  ECOG PERFORMANCE STATUS: 0 - Asymptomatic Vitals:   12/04/19 1000  BP: 104/63  Pulse: 62  Resp: 16  Temp: 98 F (36.7 C)    Filed Weights   12/04/19 1000  Weight: 121 lb (54.9 kg)     Physical Exam Constitutional:      General: She is not in acute distress.    Appearance: She is not diaphoretic.     Comments: Thin built, she walks independantly  HENT:     Head: Normocephalic and atraumatic.     Nose: Nose normal.     Mouth/Throat:     Pharynx: No oropharyngeal exudate.  Eyes:     General: No scleral icterus.    Pupils: Pupils are equal, round, and reactive to light.  Neck:     Vascular: No JVD.  Cardiovascular:     Rate and Rhythm: Normal rate and regular rhythm.     Heart sounds: No murmur heard.   Pulmonary:     Effort: Pulmonary effort is normal. No respiratory distress.     Breath sounds: Normal breath sounds. No rales.  Chest:     Chest wall: No tenderness.  Abdominal:     General: Bowel sounds are normal. There is no distension.     Palpations: Abdomen is soft.     Tenderness: There is  no abdominal tenderness.  Musculoskeletal:        General: Normal range of motion.     Cervical back: Normal range of motion and neck supple.  Lymphadenopathy:     Cervical: No cervical adenopathy.  Skin:    General: Skin is warm and dry.     Findings: No erythema.     Comments: Right anterior medi port +   Neurological:     Mental Status: She is alert and oriented to person, place, and time.     Cranial Nerves: No cranial nerve deficit.     Motor: No abnormal muscle tone.     Coordination: Coordination normal.  Psychiatric:        Mood and Affect: Affect normal.        Judgment: Judgment normal.      LABORATORY DATA: I have personally reviewed the data as listed: CBC    Component Value Date/Time    WBC 2.8 (L) 12/02/2019 1103   RBC 2.62 (L) 12/02/2019 1103   HGB 10.6 (L) 12/02/2019 1103   HCT 30.2 (L) 12/02/2019 1103   PLT 125 (L) 12/02/2019 1103   MCV 115.3 (H) 12/02/2019 1103   MCH 40.5 (H) 12/02/2019 1103   MCHC 35.1 12/02/2019 1103   RDW 14.8 12/02/2019 1103   LYMPHSABS 0.8 12/02/2019 1103   MONOABS 0.3 12/02/2019 1103   EOSABS 0.1 12/02/2019 1103   BASOSABS 0.0 12/02/2019 1103   CMP Latest Ref Rng & Units 12/02/2019 10/28/2019 09/29/2019  Glucose 70 - 99 mg/dL 99 92 101(H)  BUN 8 - 23 mg/dL _0 Creatinine 0.44 - 1.00 mg/dL 0.73 0.78 0.81  Sodium 135 - 145 mmol/L 134(L) 134(L) 132(L)  Potassium 3.5 - 5.1 mmol/L 4.6 3.9 4.3  Chloride 98 - 111 mmol/L 101 103 99  CO2 22 - 32 mmol/L _1 Calcium 8.9 - 10.3 mg/dL 9.8 9.6 9.9  Total Protein 6.5 - 8.1 g/dL 7.3 7.1 7.5  Total Bilirubin 0.3 - 1.2 mg/dL 0.5 0.9 0.8  Alkaline Phos 38 - 126 U/L 65 59 63  AST 15 - 41 U/L 15 14(L) 16  ALT 0 - 44 U/L _2 Pathology 11/16/2016 Surgical Pathology  CASE: [id number]  PATIENT: Ethylene Leavey  Surgical Pathology Report   SPECIMEN SUBMITTED:  A. Retroperitoneal adenopathy, left  DIAGNOSIS:  A. LYMPH NODE, LEFT RETROPERITONEAL; CT-GUIDED CORE BIOPSY:  - METASTATIC HIGH-GRADE SEROUS CARCINOMA.  Pathology 03/14/2017   DIAGNOSIS:  A. OMENTUM; OMENTECTOMY:  - NO TUMOR SEEN.  - ONE NEGATIVE LYMPH NODE (0/1).   B. RIGHT FALLOPIAN TUBE AND OVARY; SALPINGO-OOPHORECTOMY:  - SMALL FOCI OF HIGH GRADE SEROUS CARCINOMA INVOLVING THE OVARY.  - MARKED THERAPY RELATED CHANGE.  - NO TUMOR SEEN IN THE FALLOPIAN TUBE.   C. UTERUS, CERVIX, LEFT FALLOPIAN TUBE AND OVARY; HYSTERECTOMY AND LEFT  SALPINGO-OOPHORECTOMY:  - NABOTHIAN CYSTS.  - CYSTIC ATROPHY OF THE ENDOMETRIUM.  - SEROSAL ADHESIONS.  - UNREMARKABLE FALLOPIAN TUBE.  - OVARY SHOWING TREATMENT RELATED CHANGE.   D. 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    RADIOGRAPHIC STUDIES: I have personally reviewed the radiological images as listed and agreed with the findings in the report. CT Chest W Contrast  Result Date: 09/29/2019 CLINICAL DATA:  Follow-up ovarian cancer, diagnosed in 2018 history of prior chemotherapy. EXAM: CT CHEST, ABDOMEN, AND PELVIS WITH CONTRAST TECHNIQUE: Multidetector CT imaging of the chest, abdomen and pelvis  was performed following the standard protocol during bolus administration of intravenous contrast. CONTRAST:  50m OMNIPAQUE IOHEXOL 300 MG/ML  SOLN COMPARISON:  06/26/2019 FINDINGS: CT CHEST FINDINGS Cardiovascular: RIGHT-sided Port-A-Cath terminates at the distal aspect of the superior vena cava. Aortic caliber is normal. Heart size is normal. No pericardial effusion. Venous phase appearance of central pulmonary vasculature is unremarkable. Mediastinum/Nodes: Thoracic inlet structures are normal. No axillary lymphadenopathy. No mediastinal lymphadenopathy. No hilar lymphadenopathy. No retrocrural adenopathy. Lungs/Pleura: Biapical pleural and parenchymal scarring similar to the prior study. No consolidation. No pleural effusion. Small nodules at the RIGHT lung base are unchanged largest on image 143 of series 3 measuring approximately 4 mm. 5 mm LEFT lower lobe pulmonary nodule (image 104, series 3) not changed. Airways are patent.  No new pulmonary nodule. Musculoskeletal: See below for full musculoskeletal details. CT ABDOMEN PELVIS FINDINGS Hepatobiliary: No focal, suspicious hepatic lesion. Gallbladder is normal. No biliary ductal dilation. Pancreas: Pancreas is normal without ductal dilation or inflammation. Spleen: Spleen normal size without focal lesion. Adrenals/Urinary Tract: Adrenal glands are normal. Kidneys enhance symmetrically. No hydronephrosis. No suspicious renal lesion. Small cyst in the lower pole of the RIGHT kidney. Urinary bladder is under distended limiting assessment. Stomach/Bowel:  No sign of bowel obstruction. No acute bowel process. Vascular/Lymphatic: Vascular structures in the abdomen are patent without significant atherosclerotic change. Retroperitoneal adenopathy, intra-aortocaval and retrocaval lymph nodes (image 73, series 2) largest at 2.0 cm short axis previously approximately 1.6 cm this is the more medial of the 2 largest lymph nodes. The the more lateral retrocaval node measures 1.6 cm today previously 1.9 cm short axis. A smaller lymph node on image 77 of series 2 inferior to both of these lymph nodes measures 1.1 cm. This is unchanged. No sign of pelvic adenopathy. Reproductive: Post hysterectomy. Mass along small bowel loops in the RIGHT hemipelvis measuring 3.6 x 1.5 cm, previously 3.6 x 1.7 cm. Other: No ascites. No peritoneal nodule aside from previously mention disease in the pelvis. Musculoskeletal: No acute bone finding. No destructive bone process. IMPRESSION: 1. On balance, stable disease in the retroperitoneum with enlargement of the smaller of the 2 lymph nodes on the prior study and decrease in size of the largest lymph node seen on the previous exam. 2. Mass along small bowel loops in the RIGHT hemipelvis measuring 3.6 x 1.5 cm, previously 3.6 x 1.7 cm. 3. Stable small nodules at the RIGHT lung base. 4. No new pulmonary nodule. 5. Aortic atherosclerosis. Aortic Atherosclerosis (ICD10-I70.0). Electronically Signed   By: GZetta BillsM.D.   On: 09/29/2019 14:52   CT Abdomen Pelvis W Contrast  Result Date: 09/29/2019 CLINICAL DATA:  Follow-up ovarian cancer, diagnosed in 2018 history of prior chemotherapy. EXAM: CT CHEST, ABDOMEN, AND PELVIS WITH CONTRAST TECHNIQUE: Multidetector CT imaging of the chest, abdomen and pelvis was performed following the standard protocol during bolus administration of intravenous contrast. CONTRAST:  823mOMNIPAQUE IOHEXOL 300 MG/ML  SOLN COMPARISON:  06/26/2019 FINDINGS: CT CHEST FINDINGS Cardiovascular: RIGHT-sided Port-A-Cath  terminates at the distal aspect of the superior vena cava. Aortic caliber is normal. Heart size is normal. No pericardial effusion. Venous phase appearance of central pulmonary vasculature is unremarkable. Mediastinum/Nodes: Thoracic inlet structures are normal. No axillary lymphadenopathy. No mediastinal lymphadenopathy. No hilar lymphadenopathy. No retrocrural adenopathy. Lungs/Pleura: Biapical pleural and parenchymal scarring similar to the prior study. No consolidation. No pleural effusion. Small nodules at the RIGHT lung base are unchanged largest on image 143 of series 3 measuring approximately 4 mm.  5 mm LEFT lower lobe pulmonary nodule (image 104, series 3) not changed. Airways are patent.  No new pulmonary nodule. Musculoskeletal: See below for full musculoskeletal details. CT ABDOMEN PELVIS FINDINGS Hepatobiliary: No focal, suspicious hepatic lesion. Gallbladder is normal. No biliary ductal dilation. Pancreas: Pancreas is normal without ductal dilation or inflammation. Spleen: Spleen normal size without focal lesion. Adrenals/Urinary Tract: Adrenal glands are normal. Kidneys enhance symmetrically. No hydronephrosis. No suspicious renal lesion. Small cyst in the lower pole of the RIGHT kidney. Urinary bladder is under distended limiting assessment. Stomach/Bowel: No sign of bowel obstruction. No acute bowel process. Vascular/Lymphatic: Vascular structures in the abdomen are patent without significant atherosclerotic change. Retroperitoneal adenopathy, intra-aortocaval and retrocaval lymph nodes (image 73, series 2) largest at 2.0 cm short axis previously approximately 1.6 cm this is the more medial of the 2 largest lymph nodes. The the more lateral retrocaval node measures 1.6 cm today previously 1.9 cm short axis. A smaller lymph node on image 77 of series 2 inferior to both of these lymph nodes measures 1.1 cm. This is unchanged. No sign of pelvic adenopathy. Reproductive: Post hysterectomy. Mass along  small bowel loops in the RIGHT hemipelvis measuring 3.6 x 1.5 cm, previously 3.6 x 1.7 cm. Other: No ascites. No peritoneal nodule aside from previously mention disease in the pelvis. Musculoskeletal: No acute bone finding. No destructive bone process. IMPRESSION: 1. On balance, stable disease in the retroperitoneum with enlargement of the smaller of the 2 lymph nodes on the prior study and decrease in size of the largest lymph node seen on the previous exam. 2. Mass along small bowel loops in the RIGHT hemipelvis measuring 3.6 x 1.5 cm, previously 3.6 x 1.7 cm. 3. Stable small nodules at the RIGHT lung base. 4. No new pulmonary nodule. 5. Aortic atherosclerosis. Aortic Atherosclerosis (ICD10-I70.0). Electronically Signed   By: Zetta Bills M.D.   On: 09/29/2019 14:52    ASSESSMENT/PLAN Cancer Staging Malignant neoplasm of ovary [facility]) Staging form: Ovary, Fallopian Tube, and Primary Peritoneal Carcinoma, AJCC 8th Edition - Clinical stage from 11/22/2016: Stage IIIC (cT3c, cN1b, cM0) - Signed by Earlie Server, MD on 11/22/2016  1. High grade ovarian cancer ([place])   2. Port-A-Cath in place   3. Thrombocytopenia ([place])   : # Ovarian Cancer, local recurrence.  Currently on olaparib 300 mg twice daily. She tolerates well.  CA 125 18.7-->61.2-->111-->90.5-->69--> 54.8--> 29.1-->47-->71.2-->67.7->73.2-->82.8-->70.4-->66.4--> 58.4--> 64.9.  Labs are reviewed and discussed with patient. Continue  olaparib  300 mg twice daily. Obtain CT chest abdomen pelvis in September. . Chronic anemia, hemoglobin stable at 10.6.  Continue to monitor. 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    Thrombocytopenia, grade 1, stable platelet count. Port flush every  8 weeks. Discussed with patient about CDC recommendation of third dose of COVID-19 vaccination in immunocompromised patient.  Recommend patient to have third dose of vaccination.  RTF 4 weeks  Orders Placed This Encounter  Procedures  . CT CHEST ABDOMEN  PELVIS W CONTRAST    Standing Status:   Future    Standing Expiration Date:   12/03/2020    Order Specific Question:   Reason for Exam (SYMPTOM  OR DIAGNOSIS REQUIRED)    Answer:   ovarian cancer follow up    Order Specific Question:   Preferred imaging location?    Answer:   [facility]    Order Specific Question:   Radiology Contrast Protocol - do NOT remove file path    Answer:   [email or web address]  .  CBC with Differential/Platelet    Standing Status:   Future    Standing Expiration Date:   12/03/2020  . Comprehensive metabolic panel    Standing Status:   Future    Standing Expiration Date:   12/03/2020  . CA 125    Standing Status:   Future    Standing Expiration Date:   12/03/2020    Earlie Server, MD, PhD Hematology Oncology [facility] Pager- [phone] 12/04/19

## 2019-12-04 NOTE — Progress Notes (Signed)
Patient denies new problems/concerns today.   °

## 2019-12-17 ENCOUNTER — Inpatient Hospital Stay: Payer: Medicare Other

## 2019-12-26 ENCOUNTER — Other Ambulatory Visit: Payer: Self-pay | Admitting: Oncology

## 2019-12-26 DIAGNOSIS — C569 Malignant neoplasm of unspecified ovary: Secondary | ICD-10-CM

## 2019-12-29 ENCOUNTER — Other Ambulatory Visit: Payer: Self-pay

## 2019-12-29 ENCOUNTER — Ambulatory Visit
Admission: RE | Admit: 2019-12-29 | Discharge: 2019-12-29 | Disposition: A | Discharge status: Home / Self Care | Payer: Medicare Other | Source: Ambulatory Visit | Attending: Oncology | Admitting: Oncology

## 2019-12-29 DIAGNOSIS — C569 Malignant neoplasm of unspecified ovary: Secondary | ICD-10-CM | POA: Diagnosis not present

## 2019-12-29 DIAGNOSIS — R911 Solitary pulmonary nodule: Secondary | ICD-10-CM | POA: Diagnosis not present

## 2019-12-29 MED ORDER — IOHEXOL 300 MG/ML  SOLN
85.0000 mL | Freq: Once | INTRAMUSCULAR | Status: AC | PRN
  Administered 2019-12-29: 85 mL via INTRAVENOUS

## 2020-01-01 ENCOUNTER — Other Ambulatory Visit: Payer: Self-pay

## 2020-01-01 ENCOUNTER — Inpatient Hospital Stay: Payer: Medicare Other | Attending: Oncology

## 2020-01-01 DIAGNOSIS — R911 Solitary pulmonary nodule: Secondary | ICD-10-CM | POA: Diagnosis not present

## 2020-01-01 DIAGNOSIS — N888 Other specified noninflammatory disorders of cervix uteri: Secondary | ICD-10-CM | POA: Diagnosis not present

## 2020-01-01 DIAGNOSIS — I700 Atherosclerosis of aorta: Secondary | ICD-10-CM | POA: Insufficient documentation

## 2020-01-01 DIAGNOSIS — D7589 Other specified diseases of blood and blood-forming organs: Secondary | ICD-10-CM | POA: Insufficient documentation

## 2020-01-01 DIAGNOSIS — Z9049 Acquired absence of other specified parts of digestive tract: Secondary | ICD-10-CM | POA: Diagnosis not present

## 2020-01-01 DIAGNOSIS — D649 Anemia, unspecified: Secondary | ICD-10-CM | POA: Diagnosis not present

## 2020-01-01 DIAGNOSIS — D696 Thrombocytopenia, unspecified: Secondary | ICD-10-CM | POA: Insufficient documentation

## 2020-01-01 DIAGNOSIS — C569 Malignant neoplasm of unspecified ovary: Secondary | ICD-10-CM | POA: Insufficient documentation

## 2020-01-01 DIAGNOSIS — Z79899 Other long term (current) drug therapy: Secondary | ICD-10-CM | POA: Diagnosis not present

## 2020-01-01 DIAGNOSIS — Z95828 Presence of other vascular implants and grafts: Secondary | ICD-10-CM

## 2020-01-01 LAB — CBC WITH DIFFERENTIAL/PLATELET
Abs Immature Granulocytes: 0.02 10*3/uL (ref 0.00–0.07)
Basophils Absolute: 0 10*3/uL (ref 0.0–0.1)
Basophils Relative: 1 %
Eosinophils Absolute: 0.1 10*3/uL (ref 0.0–0.5)
Eosinophils Relative: 2 %
HCT: 29.4 % — ABNORMAL LOW (ref 36.0–46.0)
Hemoglobin: 10.8 g/dL — ABNORMAL LOW (ref 12.0–15.0)
Immature Granulocytes: 1 %
Lymphocytes Relative: 31 %
Lymphs Abs: 1.2 10*3/uL (ref 0.7–4.0)
MCH: 41.7 pg — ABNORMAL HIGH (ref 26.0–34.0)
MCHC: 36.7 g/dL — ABNORMAL HIGH (ref 30.0–36.0)
MCV: 113.5 fL — ABNORMAL HIGH (ref 80.0–100.0)
Monocytes Absolute: 0.4 10*3/uL (ref 0.1–1.0)
Monocytes Relative: 10 %
Neutro Abs: 2.1 10*3/uL (ref 1.7–7.7)
Neutrophils Relative %: 55 %
Platelets: 124 10*3/uL — ABNORMAL LOW (ref 150–400)
RBC: 2.59 MIL/uL — ABNORMAL LOW (ref 3.87–5.11)
RDW: 14.6 % (ref 11.5–15.5)
WBC: 3.8 10*3/uL — ABNORMAL LOW (ref 4.0–10.5)
nRBC: 0.5 % — ABNORMAL HIGH (ref 0.0–0.2)

## 2020-01-01 LAB — COMPREHENSIVE METABOLIC PANEL
ALT: 8 U/L (ref 0–44)
AST: 15 U/L (ref 15–41)
Albumin: 4.4 g/dL (ref 3.5–5.0)
Alkaline Phosphatase: 58 U/L (ref 38–126)
Anion gap: 9 (ref 5–15)
BUN: 11 mg/dL (ref 8–23)
CO2: 23 mmol/L (ref 22–32)
Calcium: 9.5 mg/dL (ref 8.9–10.3)
Chloride: 101 mmol/L (ref 98–111)
Creatinine, Ser: 0.66 mg/dL (ref 0.44–1.00)
GFR calc Af Amer: >60 mL/min (ref >60)
GFR calc non Af Amer: >60 mL/min (ref >60)
Glucose, Bld: 87 mg/dL (ref 70–99)
Potassium: 4 mmol/L (ref 3.5–5.1)
Sodium: 133 mmol/L — ABNORMAL LOW (ref 135–145)
Total Bilirubin: 0.8 mg/dL (ref 0.3–1.2)
Total Protein: 7.1 g/dL (ref 6.5–8.1)

## 2020-01-01 MED ORDER — HEPARIN SOD (PORK) LOCK FLUSH 100 UNIT/ML IV SOLN
500.0000 [IU] | Freq: Once | INTRAVENOUS | Status: AC
  Administered 2020-01-01: 500 [IU] via INTRAVENOUS
  Filled 2020-01-01: qty 5

## 2020-01-01 MED ORDER — SODIUM CHLORIDE 0.9% FLUSH
10.0000 mL | Freq: Once | INTRAVENOUS | Status: AC
  Administered 2020-01-01: 10 mL via INTRAVENOUS
  Filled 2020-01-01: qty 10

## 2020-01-01 MED ORDER — HEPARIN SOD (PORK) LOCK FLUSH 100 UNIT/ML IV SOLN
INTRAVENOUS | Status: AC
  Filled 2020-01-01: qty 5

## 2020-01-02 ENCOUNTER — Inpatient Hospital Stay (HOSPITAL_BASED_OUTPATIENT_CLINIC_OR_DEPARTMENT_OTHER): Payer: Medicare Other | Admitting: Oncology

## 2020-01-02 ENCOUNTER — Encounter: Payer: Self-pay | Admitting: Oncology

## 2020-01-02 VITALS — BP 114/71 | HR 64 | Temp 98.0°F | Resp 16 | Wt 122.0 lb

## 2020-01-02 DIAGNOSIS — Z5111 Encounter for antineoplastic chemotherapy: Secondary | ICD-10-CM | POA: Diagnosis not present

## 2020-01-02 DIAGNOSIS — D696 Thrombocytopenia, unspecified: Secondary | ICD-10-CM | POA: Diagnosis not present

## 2020-01-02 DIAGNOSIS — I700 Atherosclerosis of aorta: Secondary | ICD-10-CM | POA: Diagnosis not present

## 2020-01-02 DIAGNOSIS — C569 Malignant neoplasm of unspecified ovary: Secondary | ICD-10-CM

## 2020-01-02 DIAGNOSIS — Z95828 Presence of other vascular implants and grafts: Secondary | ICD-10-CM

## 2020-01-02 DIAGNOSIS — D7589 Other specified diseases of blood and blood-forming organs: Secondary | ICD-10-CM | POA: Diagnosis not present

## 2020-01-02 DIAGNOSIS — D649 Anemia, unspecified: Secondary | ICD-10-CM | POA: Diagnosis not present

## 2020-01-02 DIAGNOSIS — R911 Solitary pulmonary nodule: Secondary | ICD-10-CM | POA: Diagnosis not present

## 2020-01-02 DIAGNOSIS — N888 Other specified noninflammatory disorders of cervix uteri: Secondary | ICD-10-CM | POA: Diagnosis not present

## 2020-01-02 LAB — CA 125: Cancer Antigen (CA) 125: 56.9 U/mL — ABNORMAL HIGH (ref 0.0–38.1)

## 2020-01-02 MED ORDER — OLAPARIB 150 MG PO TABS: 300.0000 mg | ORAL_TABLET | Freq: Two times a day (BID) | ORAL | 3 refills | Status: DC

## 2020-01-02 NOTE — Progress Notes (Signed)
Patient denies new problems/concerns today.   °

## 2020-01-02 NOTE — Progress Notes (Signed)
Katie Hill Follow up visit  Patient Care Team: Patient, [name], [name], [name], [name], MD as Referring Physician (Obstetrics and Gynecology)  CHIEF COMPLAINTS/PURPOSE OF Visit Follow up for chemotherapy tolerability of  ovarian Hill.  HISTORY OF PRESENTING ILLNESS: Katie Hill 69 y.o. female presents for follow up of management of stage IIIC ovarian Hill. She underwent  Neoadjuvant chemotherapy of carbo and taxol x 4  # Patient had debulking surgery on 03/14/2017. She had a laparoscopy with conversion to laparotomy, total hysterectomy, with bilateral salpingo oophorectomy, right aortic lymph node dissection, omentectomy. Pathology showed small foci of residual disease in ovary.   Genetic testing negative for83 genes on [facility]'s Multi-Hill panel (ALK, APC, ATM, AXIN2, BAP1, BARD1, BLM, BMPR1A, BRCA1, BRCA2, BRIP1, CASR, CDC73, CDH1, CDK4, CDKN1B, CDKN1C, CDKN2A, CEBPA, CHEK2, CTNNA1, DICER1, DIS3L2, EGFR, EPCAM, FH, FLCN, GATA2, GPC3, GREM1, HOXB13, HRAS, KIT, MAX, MEN1, MET, MITF, MLH1, MSH2, MSH3, MSH6, MUTYH, NBN, NF1, NF2, NTHL1, PALB2, PDGFRA, PHOX2B, PMS2, POLD1, POLE, POT1, PRKAR1A, PTCH1, PTEN, RAD50, RAD51C, RAD51D, RB1, RECQL4, RET, RUNX1, SDHA, SDHAF2, SDHB, SDHC, SDHD, SMAD4, SMARCA4, SMARCB1, SMARCE1, STK11, SUFU, TERC, TERT, TMEM127, TP53, TSC1, TSC2, VHL, WRN, WT1).  A Variant of UncertainSignificancewas detected: CASRc.106G>A (p.Gly36Arg). Myraid testing negative for somatic BRACA1/2, positive for HRD.   # HRD positive, was referred to [facility] for clinical trials of Olarparib maintenance. She opted out.  #  Treatment:  Stage IIIC Ovarian Hill:  #s/p Carboplatin and taxol x 4 neoadjuvant chemotherapy followed by debulking surgery  03/14/2017 # 03/28/2017 S/p Adjuvant carbo and taxol x3   Local recurrence, CA125 one 46.3 03/15/2018-05/17/2018 Carboplatin and Taxol x 4. CA125 decrease from 49.7 to 6 after 4 cycles of treatment.  Started on Olarparib 316m BID on 05/17/2018.  # was seen by Dr. BFransisca Connorsfor further evaluation due to rising Ca1 25 and MRI abdomenPelvis on 11/05/2018 showed Interval progression of, now measuring 2.7 x 2.3 cm and concerning for progression of metastatic disease.retrocaval lymph node in the abdomen Olaparib was held for short period of time. Her CA125 trended down again.  Dr. BFransisca Connorsrecommending resume olaparib and continue monitor. If she has more significant progression of disease she may be a candidate for clinical trial or will be treated with other chemotherapy drugs including Doxil, gemcitabine and Avastin.   INTERVAL HISTORY 69y.o. female with above oncology history reviewed by me today presents for evaluation for chemotherapy for treatment of ovarian Hill  Patient was accompanied by her husband. Patient has been on olaparib. She continues to do very well clinically. Weight has been stable. Denies any fever, chills, abdominal pain. Review of Systems  Constitutional: Negative for appetite change, chills, fatigue and fever.  HENT:   Negative for hearing loss and voice change.   Eyes: Negative for eye problems.  Respiratory: Negative for chest tightness and cough.   Cardiovascular: Negative for chest pain.  Gastrointestinal: Negative for abdominal distention, abdominal pain and blood in stool.  Endocrine: Negative for hot flashes.  Genitourinary: Negative for bladder incontinence, difficulty urinating, frequency and hematuria.   Musculoskeletal: Negative for arthralgias.  Skin: Negative for itching and rash.  Neurological: Negative for extremity weakness.  Hematological: Negative for adenopathy.  Psychiatric/Behavioral: Negative for confusion.   MEDICAL HISTORY: Past Medical  History:  Diagnosis Date  . Dysrhythmia   .  Genetic testing 03/28/2017   Multi-Hill panel (83 genes) @ [facility] - No pathogenic mutations detected  . High grade ovarian Hill ([place]) 11/20/2016  . Pelvic mass in female     SURGICAL HISTORY: Past Surgical History:  Procedure Laterality Date  . APPENDECTOMY    . LAPAROSCOPY N/A 03/14/2017   Procedure: LAPAROSCOPY OPERATIVE;  Surgeon: Mellody Drown, MD;  Location: [facility];  Service: Gynecology;  Laterality: N/A;  . LAPAROTOMY N/A 03/14/2017   Procedure: LAPAROTOMY;  Surgeon: Mellody Drown, MD;  Location: [facility];  Service: Gynecology;  Laterality: N/A;  . LYMPH NODE DISSECTION N/A 03/14/2017   Procedure: LYMPH NODE DISSECTION;  Surgeon: Mellody Drown, MD;  Location: [facility];  Service: Gynecology;  Laterality: N/A;  . OMENTECTOMY N/A 03/14/2017   Procedure: OMENTECTOMY;  Surgeon: Mellody Drown, MD;  Location: [facility];  Service: Gynecology;  Laterality: N/A;  . PORTA CATH INSERTION N/A 11/27/2016   Procedure: Glori Luis Cath Insertion;  Surgeon: Algernon Huxley, MD;  Location: [facility];  Service: Cardiovascular;  Laterality: N/A;    SOCIAL HISTORY: Social History   Tobacco Use  . Smoking status: Never Smoker  . Smokeless tobacco: Never Used  Vaping Use  . Vaping Use: Never used  Substance Use Topics  . Alcohol use: Not Currently  . Drug use: No     FAMILY HISTORY Family History  Problem Relation Age of Onset  . Throat Hill Cousin   . Throat Hill Cousin   . Leukemia Cousin     ALLERGIES:  is allergic to omeprazole.  MEDICATIONS:  Current Outpatient Medications  Medication Sig Dispense Refill  . lidocaine-prilocaine (EMLA) cream Apply 1 application topically as needed. Apply small amount to port site at least 1 hour prior to it being accessed, cover with plastic wrap 30 g 1  . olaparib (LYNPARZA) 150 MG tablet Take 2 tablets (300 mg total) by mouth 2 (two) times daily. Swallow whole. May take with food  to decrease nausea and vomiting. 120 tablet 3  . vitamin B-12 (CYANOCOBALAMIN) 1000 MCG tablet Take 1 tablet (1,000 mcg total) by mouth daily. 90 tablet 4   No current facility-administered medications for this visit.    PHYSICAL EXAMINATION:  ECOG PERFORMANCE STATUS: 0 - Asymptomatic Vitals:   01/02/20 1336  BP: 114/71  Pulse: 64  Resp: 16  Temp: 98 F (36.7 C)    Filed Weights   01/02/20 1336  Weight: 122 lb (55.3 kg)     Physical Exam Constitutional:      General: She is not in acute distress.    Appearance: She is not diaphoretic.     Comments: Thin built, she walks independantly  HENT:     Head: Normocephalic and atraumatic.     Nose: Nose normal.     Mouth/Throat:     Pharynx: No oropharyngeal exudate.  Eyes:     General: No scleral icterus.    Pupils: Pupils are equal, round, and reactive to light.  Neck:     Vascular: No JVD.  Cardiovascular:     Rate and Rhythm: Normal rate and regular rhythm.     Heart sounds: No murmur heard.   Pulmonary:     Effort: Pulmonary effort is normal. No respiratory distress.     Breath sounds: Normal breath sounds. No rales.  Chest:     Chest wall: No tenderness.  Abdominal:     General: Bowel sounds are normal. There is no distension.     Palpations: Abdomen is  soft.     Tenderness: There is no abdominal tenderness.  Musculoskeletal:        General: Normal range of motion.     Cervical back: Normal range of motion and neck supple.  Lymphadenopathy:     Cervical: No cervical adenopathy.  Skin:    General: Skin is warm and dry.     Findings: No erythema.     Comments: Right anterior medi port +   Neurological:     Mental Status: She is alert and oriented to person, place, and time.     Cranial Nerves: No cranial nerve deficit.     Motor: No abnormal muscle tone.     Coordination: Coordination normal.  Psychiatric:        Mood and Affect: Affect normal.        Judgment: Judgment normal.      LABORATORY  DATA: I have personally reviewed the data as listed: CBC    Component Value Date/Time   WBC 3.8 (L) 01/01/2020 1251   RBC 2.59 (L) 01/01/2020 1251   HGB 10.8 (L) 01/01/2020 1251   HCT 29.4 (L) 01/01/2020 1251   PLT 124 (L) 01/01/2020 1251   MCV 113.5 (H) 01/01/2020 1251   MCH 41.7 (H) 01/01/2020 1251   MCHC 36.7 (H) 01/01/2020 1251   RDW 14.6 01/01/2020 1251   LYMPHSABS 1.2 01/01/2020 1251   MONOABS 0.4 01/01/2020 1251   EOSABS 0.1 01/01/2020 1251   BASOSABS 0.0 01/01/2020 1251   CMP Latest Ref Rng & Units 01/01/2020 12/02/2019 10/28/2019  Glucose 70 - 99 mg/dL 87 99 92  BUN 8 - 23 mg/dL '11 11 13  ' Creatinine 0.44 - 1.00 mg/dL 0.66 0.73 0.78  Sodium 135 - 145 mmol/L 133(L) 134(L) 134(L)  Potassium 3.5 - 5.1 mmol/L 4.0 4.6 3.9  Chloride 98 - 111 mmol/L 101 101 103  CO2 22 - 32 mmol/L '23 26 24  ' Calcium 8.9 - 10.3 mg/dL 9.5 9.8 9.6  Total Protein 6.5 - 8.1 g/dL 7.1 7.3 7.1  Total Bilirubin 0.3 - 1.2 mg/dL 0.8 0.5 0.9  Alkaline Phos 38 - 126 U/L 58 65 59  AST 15 - 41 U/L 15 15 14(L)  ALT 0 - 44 U/L '8 10 9    ' Pathology 11/16/2016 Surgical Pathology  CASE: [id number]  PATIENT: Katie Hill  Surgical Pathology Report   SPECIMEN SUBMITTED:  A. Retroperitoneal adenopathy, left  DIAGNOSIS:  A. LYMPH NODE, LEFT RETROPERITONEAL; CT-GUIDED CORE BIOPSY:  - METASTATIC HIGH-GRADE SEROUS CARCINOMA.  Pathology 03/14/2017   DIAGNOSIS:  A. OMENTUM; OMENTECTOMY:  - NO TUMOR SEEN.  - ONE NEGATIVE LYMPH NODE (0/1).   B. RIGHT FALLOPIAN TUBE AND OVARY; SALPINGO-OOPHORECTOMY:  - SMALL FOCI OF HIGH GRADE SEROUS CARCINOMA INVOLVING THE OVARY.  - MARKED THERAPY RELATED CHANGE.  - NO TUMOR SEEN IN THE FALLOPIAN TUBE.   C. UTERUS, CERVIX, LEFT FALLOPIAN TUBE AND OVARY; HYSTERECTOMY AND LEFT  SALPINGO-OOPHORECTOMY:  - NABOTHIAN CYSTS.  - CYSTIC ATROPHY OF THE ENDOMETRIUM.  - SEROSAL ADHESIONS.  - UNREMARKABLE FALLOPIAN TUBE.  - OVARY SHOWING TREATMENT RELATED CHANGE.   D.  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    RADIOGRAPHIC STUDIES: I have personally reviewed the radiological images as listed and agreed with the findings in the report. CT CHEST ABDOMEN PELVIS W CONTRAST  Result Date: 12/29/2019 CLINICAL DATA:  Ovarian Hill follow-up. EXAM: CT CHEST, ABDOMEN, AND PELVIS WITH CONTRAST TECHNIQUE: Multidetector CT imaging of the  chest, abdomen and pelvis was performed following the standard protocol during bolus administration of intravenous contrast. CONTRAST:  7m OMNIPAQUE IOHEXOL 300 MG/ML  SOLN COMPARISON:  Prior exam from September 29, 2019 FINDINGS: CT CHEST FINDINGS Cardiovascular: Aortic caliber is normal. Vertebral artery arising directly from the aortic arch. RIGHT-sided Port-A-Cath terminates at the caval to atrial junction. Heart size is normal without pericardial effusion. Central pulmonary vasculature is unremarkable on venous phase assessment. Mediastinum/Nodes: Thoracic inlet structures are unremarkable. No axillary lymphadenopathy. No mediastinal lymphadenopathy. No hilar lymphadenopathy. Lungs/Pleura: No consolidation. No pleural effusion. Small pulmonary nodule the LEFT lung base measures 5 mm and is unchanged compared to the prior study. Airways are patent. Musculoskeletal: See below for full musculoskeletal detail. No acute musculoskeletal process. No destructive bone finding about the bony thorax. CT ABDOMEN PELVIS FINDINGS Hepatobiliary: No suspicious, focal hepatic lesion. Portal vein is patent. No pericholecystic stranding. No biliary duct dilation. Pancreas: Pancreas without ductal dilation or signs of inflammation. Spleen: Spleen normal in size and contour. Adrenals/Urinary Tract: Adrenal glands are normal. No hydronephrosis. Symmetric renal enhancement. Low-density lesions in the bilateral kidneys likely small cysts without change from previous imaging. Stomach/Bowel: No  acute gastrointestinal process. Stool throughout much of the colon. Vascular/Lymphatic: Calcified atheromatous plaque in the abdominal aorta. No aneurysmal dilation. Minimal calcification is noted on today's study. Bulky retrocaval lymphadenopathy, largest at 20 mm short axis is stable compared to the prior study. The other smaller lymph nodes seen lateral to this on image 72 of series 2 is also within 1 mm of previous measurement approximately 15 mm. No new adenopathy. Signs of retroperitoneal lymphadenectomy in the past. No pelvic adenopathy. Reproductive: Post hysterectomy. 3.5 x 1.5 cm soft tissue adjacent to small bowel loops in the pelvis shows no change. Other: No ascites. Musculoskeletal: No acute bone finding. No destructive bone process. IMPRESSION: 1. Stable exam of the chest, abdomen and pelvis. 2. Stable LEFT lower lobe pulmonary nodule, retroperitoneal lymph nodes and pelvic soft tissue. 3. Aortic atherosclerosis. Aortic Atherosclerosis (ICD10-I70.0). Electronically Signed   By: GZetta BillsM.D.   On: 12/29/2019 09:03    ASSESSMENT/PLAN Hill Staging Malignant neoplasm of ovary ([facility] Staging form: Ovary, Fallopian Tube, and Primary Peritoneal Carcinoma, AJCC 8th Edition - Clinical stage from 11/22/2016: Stage IIIC (cT3c, cN1b, cM0) - Signed by YEarlie Server MD on 11/22/2016  1. Thrombocytopenia ([place]   2. Malignant neoplasm of ovary, unspecified laterality ([place]   3. Port-A-Cath in place   4. Encounter for antineoplastic chemotherapy   : # Ovarian Hill, local recurrence.  Currently on olaparib 300 mg twice daily. She tolerates well.  CA 125 18.7-->61.2-->111-->90.5-->69--> 54.8--> 29.1-->47-->71.2-->67.7->73.2-->82.8-->70.4-->66.4--> 58.4--> 64.9--> 56.9 Labs are reviewed and discussed with patient. Continue olaparib 300 mg twice daily. Interval CT images were reviewed and discussed with patient. Stable disease . Chronic anemia, hemoglobin stable at 10.8. Continue to  monitor.. 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    Thrombocytopenia, grade 1, platelet counts are stable. Port flush every  8 weeks. Recommend flu vaccination  RTF 6 weeks  Orders Placed This Encounter  Procedures  . CBC with Differential/Platelet    Standing Status:   Future    Standing Expiration Date:   01/01/2021  . Comprehensive metabolic panel    Standing Status:   Future    Standing Expiration Date:   01/01/2021  . CA 125    Standing Status:   Future    Standing Expiration Date:   01/01/2021    ZEarlie Server MD, PhD Hematology  [place] [facility] [place]- [phone] 01/02/20

## 2020-02-13 ENCOUNTER — Inpatient Hospital Stay: Payer: Medicare Other | Attending: Oncology

## 2020-02-13 DIAGNOSIS — Z79899 Other long term (current) drug therapy: Secondary | ICD-10-CM | POA: Insufficient documentation

## 2020-02-13 DIAGNOSIS — Z90722 Acquired absence of ovaries, bilateral: Secondary | ICD-10-CM | POA: Insufficient documentation

## 2020-02-13 DIAGNOSIS — Z90710 Acquired absence of both cervix and uterus: Secondary | ICD-10-CM | POA: Insufficient documentation

## 2020-02-13 DIAGNOSIS — C569 Malignant neoplasm of unspecified ovary: Secondary | ICD-10-CM | POA: Insufficient documentation

## 2020-02-13 DIAGNOSIS — D696 Thrombocytopenia, unspecified: Secondary | ICD-10-CM | POA: Insufficient documentation

## 2020-02-13 DIAGNOSIS — Z9221 Personal history of antineoplastic chemotherapy: Secondary | ICD-10-CM | POA: Insufficient documentation

## 2020-02-13 DIAGNOSIS — D649 Anemia, unspecified: Secondary | ICD-10-CM | POA: Diagnosis not present

## 2020-02-13 LAB — COMPREHENSIVE METABOLIC PANEL
ALT: 9 U/L (ref 0–44)
AST: 16 U/L (ref 15–41)
Albumin: 4.5 g/dL (ref 3.5–5.0)
Alkaline Phosphatase: 59 U/L (ref 38–126)
Anion gap: 7 (ref 5–15)
BUN: 14 mg/dL (ref 8–23)
CO2: 27 mmol/L (ref 22–32)
Calcium: 9.9 mg/dL (ref 8.9–10.3)
Chloride: 100 mmol/L (ref 98–111)
Creatinine, Ser: 0.72 mg/dL (ref 0.44–1.00)
GFR, Estimated: >60 mL/min (ref >60)
Glucose, Bld: 129 mg/dL — ABNORMAL HIGH (ref 70–99)
Potassium: 3.8 mmol/L (ref 3.5–5.1)
Sodium: 134 mmol/L — ABNORMAL LOW (ref 135–145)
Total Bilirubin: 0.7 mg/dL (ref 0.3–1.2)
Total Protein: 7.3 g/dL (ref 6.5–8.1)

## 2020-02-13 LAB — CBC WITH DIFFERENTIAL/PLATELET
Abs Immature Granulocytes: 0.01 10*3/uL (ref 0.00–0.07)
Basophils Absolute: 0 10*3/uL (ref 0.0–0.1)
Basophils Relative: 1 %
Eosinophils Absolute: 0.1 10*3/uL (ref 0.0–0.5)
Eosinophils Relative: 2 %
HCT: 30.5 % — ABNORMAL LOW (ref 36.0–46.0)
Hemoglobin: 10.9 g/dL — ABNORMAL LOW (ref 12.0–15.0)
Immature Granulocytes: 0 %
Lymphocytes Relative: 27 %
Lymphs Abs: 0.8 10*3/uL (ref 0.7–4.0)
MCH: 41.1 pg — ABNORMAL HIGH (ref 26.0–34.0)
MCHC: 35.7 g/dL (ref 30.0–36.0)
MCV: 115.1 fL — ABNORMAL HIGH (ref 80.0–100.0)
Monocytes Absolute: 0.2 10*3/uL (ref 0.1–1.0)
Monocytes Relative: 8 %
Neutro Abs: 1.9 10*3/uL (ref 1.7–7.7)
Neutrophils Relative %: 62 %
Platelets: 146 10*3/uL — ABNORMAL LOW (ref 150–400)
RBC: 2.65 MIL/uL — ABNORMAL LOW (ref 3.87–5.11)
RDW: 14.6 % (ref 11.5–15.5)
WBC: 3 10*3/uL — ABNORMAL LOW (ref 4.0–10.5)
nRBC: 0 % (ref 0.0–0.2)

## 2020-02-14 LAB — CA 125: Cancer Antigen (CA) 125: 42.2 U/mL — ABNORMAL HIGH (ref 0.0–38.1)

## 2020-02-16 ENCOUNTER — Other Ambulatory Visit: Payer: Self-pay

## 2020-02-16 ENCOUNTER — Inpatient Hospital Stay (HOSPITAL_BASED_OUTPATIENT_CLINIC_OR_DEPARTMENT_OTHER): Payer: Medicare Other | Admitting: Oncology

## 2020-02-16 ENCOUNTER — Encounter: Payer: Self-pay | Admitting: Oncology

## 2020-02-16 VITALS — BP 104/65 | HR 67 | Temp 98.3°F | Resp 18 | Wt 121.8 lb

## 2020-02-16 DIAGNOSIS — Z90710 Acquired absence of both cervix and uterus: Secondary | ICD-10-CM | POA: Diagnosis not present

## 2020-02-16 DIAGNOSIS — Z9221 Personal history of antineoplastic chemotherapy: Secondary | ICD-10-CM | POA: Diagnosis not present

## 2020-02-16 DIAGNOSIS — T451X5A Adverse effect of antineoplastic and immunosuppressive drugs, initial encounter: Secondary | ICD-10-CM | POA: Diagnosis not present

## 2020-02-16 DIAGNOSIS — Z5111 Encounter for antineoplastic chemotherapy: Secondary | ICD-10-CM

## 2020-02-16 DIAGNOSIS — Z90722 Acquired absence of ovaries, bilateral: Secondary | ICD-10-CM | POA: Diagnosis not present

## 2020-02-16 DIAGNOSIS — Z95828 Presence of other vascular implants and grafts: Secondary | ICD-10-CM | POA: Diagnosis not present

## 2020-02-16 DIAGNOSIS — D696 Thrombocytopenia, unspecified: Secondary | ICD-10-CM | POA: Diagnosis not present

## 2020-02-16 DIAGNOSIS — D6481 Anemia due to antineoplastic chemotherapy: Secondary | ICD-10-CM

## 2020-02-16 DIAGNOSIS — Z79899 Other long term (current) drug therapy: Secondary | ICD-10-CM | POA: Diagnosis not present

## 2020-02-16 DIAGNOSIS — C569 Malignant neoplasm of unspecified ovary: Secondary | ICD-10-CM | POA: Diagnosis not present

## 2020-02-16 NOTE — Progress Notes (Signed)
Katie Hill  Patient Care Team: Patient, [name], [name], [name], [name], MD as Referring Physician (Obstetrics and Gynecology)  CHIEF COMPLAINTS/PURPOSE OF Hill Follow up for chemotherapy tolerability of  ovarian cancer.  HISTORY OF PRESENTING ILLNESS: Katie Hill 69 y.o. female presents for follow up of management of stage IIIC ovarian cancer. She underwent  Neoadjuvant chemotherapy of carbo and taxol x 4  # Patient had debulking surgery on 03/14/2017. She had a laparoscopy with conversion to laparotomy, total hysterectomy, with bilateral salpingo oophorectomy, right aortic lymph node dissection, omentectomy. Pathology showed small foci of residual disease in ovary.   Genetic testing negative for83 genes on [facility]'s Multi-Cancer panel (ALK, APC, ATM, AXIN2, BAP1, BARD1, BLM, BMPR1A, BRCA1, BRCA2, BRIP1, CASR, CDC73, CDH1, CDK4, CDKN1B, CDKN1C, CDKN2A, CEBPA, CHEK2, CTNNA1, DICER1, DIS3L2, EGFR, EPCAM, FH, FLCN, GATA2, GPC3, GREM1, HOXB13, HRAS, KIT, MAX, MEN1, MET, MITF, MLH1, MSH2, MSH3, MSH6, MUTYH, NBN, NF1, NF2, NTHL1, PALB2, PDGFRA, PHOX2B, PMS2, POLD1, POLE, POT1, PRKAR1A, PTCH1, PTEN, RAD50, RAD51C, RAD51D, RB1, RECQL4, RET, RUNX1, SDHA, SDHAF2, SDHB, SDHC, SDHD, SMAD4, SMARCA4, SMARCB1, SMARCE1, STK11, SUFU, TERC, TERT, TMEM127, TP53, TSC1, TSC2, VHL, WRN, WT1).  A Variant of UncertainSignificancewas detected: CASRc.106G>A (p.Gly36Arg). Myraid testing negative for somatic BRACA1/2, positive for HRD.   # HRD positive, was referred to [facility] for clinical trials of Olarparib maintenance. She opted out.  #  Treatment:  Stage IIIC Ovarian cancer:  #s/p Carboplatin and taxol x 4 neoadjuvant chemotherapy followed by debulking surgery  03/14/2017 # 03/28/2017 S/p Adjuvant carbo and taxol x3   Local recurrence, CA125 one 46.3 03/15/2018-05/17/2018 Carboplatin and Taxol x 4. CA125 decrease from 49.7 to 6 after 4 cycles of treatment.  Started on Olarparib 344m BID on 05/17/2018.  # was seen by Dr. BFransisca Connorsfor further evaluation due to rising Ca1 25 and MRI abdomenPelvis on 11/05/2018 showed Interval progression of, now measuring 2.7 x 2.3 cm and concerning for progression of metastatic disease.retrocaval lymph node in the abdomen Olaparib was held for short period of time. Her CA125 trended down again.  Dr. BFransisca Connorsrecommending resume olaparib and continue monitor. If she has more significant progression of disease she may be a candidate for clinical trial or will be treated with other chemotherapy drugs including Doxil, gemcitabine and Avastin.   INTERVAL HISTORY 69y.o. female with above oncology history reviewed by me today presents for evaluation for chemotherapy for treatment of ovarian cancer  Patient was accompanied by her husband. Patient has been on olaparib.  She tolerates well.  No new complaints. [place] any fever, chills, abdominal pain.  Appetite is fair.  Weight has been stable . Review of Systems  Constitutional: Negative for appetite change, chills, fatigue and fever.  HENT:   Negative for hearing loss and voice change.   Eyes: Negative for eye problems.  Respiratory: Negative for chest tightness and cough.   Cardiovascular: Negative for chest pain.  Gastrointestinal: Negative for abdominal distention, abdominal pain and blood in stool.  Endocrine: Negative for hot flashes.  Genitourinary: Negative for bladder incontinence, difficulty urinating, frequency and hematuria.   Musculoskeletal: Negative for arthralgias.  Skin: Negative for itching and rash.  Neurological: Negative for extremity weakness.  Hematological: Negative for adenopathy.  Psychiatric/Behavioral: Negative for confusion.   MEDICAL  HISTORY: Past Medical History:  Diagnosis Date  . Dysrhythmia   . Genetic testing 03/28/2017   Multi-Cancer panel (83 genes) @ [facility] - No pathogenic mutations detected  . High grade ovarian cancer ([place]) 11/20/2016  . Pelvic mass in female     SURGICAL HISTORY: Past Surgical History:  Procedure Laterality Date  . APPENDECTOMY    . LAPAROSCOPY N/A 03/14/2017   Procedure: LAPAROSCOPY OPERATIVE;  Surgeon: Mellody Drown, MD;  Location: [facility];  Service: Gynecology;  Laterality: N/A;  . LAPAROTOMY N/A 03/14/2017   Procedure: LAPAROTOMY;  Surgeon: Mellody Drown, MD;  Location: [facility];  Service: Gynecology;  Laterality: N/A;  . LYMPH NODE DISSECTION N/A 03/14/2017   Procedure: LYMPH NODE DISSECTION;  Surgeon: Mellody Drown, MD;  Location: [facility];  Service: Gynecology;  Laterality: N/A;  . OMENTECTOMY N/A 03/14/2017   Procedure: OMENTECTOMY;  Surgeon: Mellody Drown, MD;  Location: [facility];  Service: Gynecology;  Laterality: N/A;  . PORTA CATH INSERTION N/A 11/27/2016   Procedure: Glori Luis Cath Insertion;  Surgeon: Algernon Huxley, MD;  Location: [place];  Service: Cardiovascular;  Laterality: N/A;    SOCIAL HISTORY: Social History   Tobacco Use  . Smoking status: Never Smoker  . Smokeless tobacco: Never Used  Vaping Use  . Vaping Use: Never used  Substance Use Topics  . Alcohol use: Not Currently  . Drug use: No     FAMILY HISTORY Family History  Problem Relation Age of Onset  . Throat cancer Cousin   . Throat cancer Cousin   . Leukemia Cousin     ALLERGIES:  is allergic to omeprazole.  MEDICATIONS:  Current Outpatient Medications  Medication Sig Dispense Refill  . lidocaine-prilocaine (EMLA) cream Apply 1 application topically as needed. Apply small amount to port site at least 1 hour prior to it being accessed, cover with plastic wrap 30 g 1  . olaparib (LYNPARZA) 150 MG tablet Take 2 tablets (300 mg total) by mouth 2 (two) times daily. Swallow  whole. May take with food to decrease nausea and vomiting. 120 tablet 3  . vitamin B-12 (CYANOCOBALAMIN) 1000 MCG tablet Take 1 tablet (1,000 mcg total) by mouth daily. 90 tablet 4   No current facility-administered medications for this Hill.    PHYSICAL EXAMINATION:  ECOG PERFORMANCE STATUS: 0 - Asymptomatic Vitals:   02/16/20 1414  BP: 104/65  Pulse: 67  Resp: 18  Temp: 98.3 F (36.8 C)    Filed Weights   02/16/20 1414  Weight: 121 lb 12.8 oz (55.2 kg)     Physical Exam Constitutional:      General: She is not in acute distress.    Appearance: She is not diaphoretic.     Comments: Thin built, she walks independantly  HENT:     Head: Normocephalic and atraumatic.     Nose: Nose normal.     Mouth/Throat:     Pharynx: No oropharyngeal exudate.  Eyes:     General: No scleral icterus.    Pupils: Pupils are equal, round, and reactive to light.  Neck:     Vascular: No JVD.  Cardiovascular:     Rate and Rhythm: Normal rate and regular rhythm.     Heart sounds: No murmur heard.   Pulmonary:     Effort: Pulmonary effort is normal. No respiratory distress.     Breath sounds: Normal breath sounds. No rales.  Chest:     Chest wall: No tenderness.  Abdominal:     General: Bowel sounds are normal. There  is no distension.     Palpations: Abdomen is soft.     Tenderness: There is no abdominal tenderness.  Musculoskeletal:        General: Normal range of motion.     Cervical back: Normal range of motion and neck supple.  Lymphadenopathy:     Cervical: No cervical adenopathy.  Skin:    General: Skin is warm and dry.     Findings: No erythema.     Comments: Right anterior medi port +   Neurological:     Mental Status: She is alert and oriented to person, place, and time.     Cranial Nerves: No cranial nerve deficit.     Motor: No abnormal muscle tone.     Coordination: Coordination normal.  Psychiatric:        Mood and Affect: Affect normal.        Judgment:  Judgment normal.      LABORATORY DATA: I have personally reviewed the data as listed: CBC    Component Value Date/Time   WBC 3.0 (L) 02/13/2020 1435   RBC 2.65 (L) 02/13/2020 1435   HGB 10.9 (L) 02/13/2020 1435   HCT 30.5 (L) 02/13/2020 1435   PLT 146 (L) 02/13/2020 1435   MCV 115.1 (H) 02/13/2020 1435   MCH 41.1 (H) 02/13/2020 1435   MCHC 35.7 02/13/2020 1435   RDW 14.6 02/13/2020 1435   LYMPHSABS 0.8 02/13/2020 1435   MONOABS 0.2 02/13/2020 1435   EOSABS 0.1 02/13/2020 1435   BASOSABS 0.0 02/13/2020 1435   CMP Latest Ref Rng & Units 02/13/2020 01/01/2020 12/02/2019  Glucose 70 - 99 mg/dL 129(H) 87 99  BUN 8 - 23 mg/dL _0 Creatinine 0.44 - 1.00 mg/dL 0.72 0.66 0.73  Sodium 135 - 145 mmol/L 134(L) 133(L) 134(L)  Potassium 3.5 - 5.1 mmol/L 3.8 4.0 4.6  Chloride 98 - 111 mmol/L 100 101 101  CO2 22 - 32 mmol/L _1 Calcium 8.9 - 10.3 mg/dL 9.9 9.5 9.8  Total Protein 6.5 - 8.1 g/dL 7.3 7.1 7.3  Total Bilirubin 0.3 - 1.2 mg/dL 0.7 0.8 0.5  Alkaline Phos 38 - 126 U/L 59 58 65  AST 15 - 41 U/L _2 ALT 0 - 44 U/L _3 Pathology 11/16/2016 Surgical Pathology  CASE: [id number]  PATIENT: Katie Hill  Surgical Pathology Report   SPECIMEN SUBMITTED:  A. Retroperitoneal adenopathy, left  DIAGNOSIS:  A. LYMPH NODE, LEFT RETROPERITONEAL; CT-GUIDED CORE BIOPSY:  - METASTATIC HIGH-GRADE SEROUS CARCINOMA.  Pathology 03/14/2017   DIAGNOSIS:  A. OMENTUM; OMENTECTOMY:  - NO TUMOR SEEN.  - ONE NEGATIVE LYMPH NODE (0/1).   B. RIGHT FALLOPIAN TUBE AND OVARY; SALPINGO-OOPHORECTOMY:  - SMALL FOCI OF HIGH GRADE SEROUS CARCINOMA INVOLVING THE OVARY.  - MARKED THERAPY RELATED CHANGE.  - NO TUMOR SEEN IN THE FALLOPIAN TUBE.   C. UTERUS, CERVIX, LEFT FALLOPIAN TUBE AND OVARY; HYSTERECTOMY AND LEFT  SALPINGO-OOPHORECTOMY:  - NABOTHIAN CYSTS.  - CYSTIC ATROPHY OF THE ENDOMETRIUM.  - SEROSAL ADHESIONS.  - UNREMARKABLE FALLOPIAN TUBE.  - OVARY SHOWING  TREATMENT RELATED CHANGE.   D. 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    RADIOGRAPHIC STUDIES: I have personally reviewed the radiological images as listed and agreed with the findings in the report. CT CHEST ABDOMEN PELVIS W CONTRAST  Result Date: 12/29/2019 CLINICAL DATA:  Ovarian cancer follow-up. EXAM: CT CHEST, ABDOMEN, AND  PELVIS WITH CONTRAST TECHNIQUE: Multidetector CT imaging of the chest, abdomen and pelvis was performed following the standard protocol during bolus administration of intravenous contrast. CONTRAST:  70m OMNIPAQUE IOHEXOL 300 MG/ML  SOLN COMPARISON:  Prior exam from September 29, 2019 FINDINGS: CT CHEST FINDINGS Cardiovascular: Aortic caliber is normal. Vertebral artery arising directly from the aortic arch. RIGHT-sided Port-A-Cath terminates at the caval to atrial junction. Heart size is normal without pericardial effusion. Central pulmonary vasculature is unremarkable on venous phase assessment. Mediastinum/Nodes: Thoracic inlet structures are unremarkable. No axillary lymphadenopathy. No mediastinal lymphadenopathy. No hilar lymphadenopathy. Lungs/Pleura: No consolidation. No pleural effusion. Small pulmonary nodule the LEFT lung base measures 5 mm and is unchanged compared to the prior study. Airways are patent. Musculoskeletal: See below for full musculoskeletal detail. No acute musculoskeletal process. No destructive bone finding about the bony thorax. CT ABDOMEN PELVIS FINDINGS Hepatobiliary: No suspicious, focal hepatic lesion. Portal vein is patent. No pericholecystic stranding. No biliary duct dilation. Pancreas: Pancreas without ductal dilation or signs of inflammation. Spleen: Spleen normal in size and contour. Adrenals/Urinary Tract: Adrenal glands are normal. No hydronephrosis. Symmetric renal enhancement. Low-density lesions in the bilateral kidneys likely small cysts without change from  previous imaging. Stomach/Bowel: No acute gastrointestinal process. Stool throughout much of the colon. Vascular/Lymphatic: Calcified atheromatous plaque in the abdominal aorta. No aneurysmal dilation. Minimal calcification is noted on today's study. Bulky retrocaval lymphadenopathy, largest at 20 mm short axis is stable compared to the prior study. The other smaller lymph nodes seen lateral to this on image 72 of series 2 is also within 1 mm of previous measurement approximately 15 mm. No new adenopathy. Signs of retroperitoneal lymphadenectomy in the past. No pelvic adenopathy. Reproductive: Post hysterectomy. 3.5 x 1.5 cm soft tissue adjacent to small bowel loops in the pelvis shows no change. Other: No ascites. Musculoskeletal: No acute bone finding. No destructive bone process. IMPRESSION: 1. Stable exam of the chest, abdomen and pelvis. 2. Stable LEFT lower lobe pulmonary nodule, retroperitoneal lymph nodes and pelvic soft tissue. 3. Aortic atherosclerosis. Aortic Atherosclerosis (ICD10-I70.0). Electronically Signed   By: GZetta BillsM.D.   On: 12/29/2019 09:03    ASSESSMENT/PLAN Cancer Staging Malignant neoplasm of ovary ([facility] Staging form: Ovary, Fallopian Tube, and Primary Peritoneal Carcinoma, AJCC 8th Edition - Clinical stage from 11/22/2016: Stage IIIC (cT3c, cN1b, cM0) - Signed by YEarlie Server MD on 11/22/2016  1. Malignant neoplasm of ovary, unspecified laterality ([place]   2. Thrombocytopenia ([facility]   3. Port-A-Cath in place   4. Encounter for antineoplastic chemotherapy   5. Anemia associated with chemotherapy   : # Ovarian Cancer, local recurrence.  Currently on olaparib 300 mg twice daily. She tolerates well.  CA 125 18.7-->61.2-->111-->90.5-->69--> 54.8--> 29.1-->47-->71.2-->67.7->73.2-->82.8-->70.4-->66.4--> 58.4--> 64.9--> 56.9--> 42.2 Labs are reviewed and discussed with patient. Continue olaparib 300 mg twice daily. Surveillance CT images will be done prior to next Hill. She  also has appointment with gynecology oncology around that time.  . Chronic anemia, hemoglobin stable at 10.9.  Continue to monitor her counts... 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    Thrombocytopenia, grade 1, platelet counts  are stable   Medi port, continue port flush every  8 weeks.  RTF 6 weeks  Orders Placed This Encounter  Procedures  . CT CHEST ABDOMEN PELVIS W CONTRAST    Standing Status:   Future    Standing Expiration Date:   02/15/2021    Order Specific Question:   Preferred imaging location?    Answer:  [facility]    Order Specific Question:   Radiology Contrast Protocol - do NOT remove file path    Answer:   [email or web address]  . CBC with Differential/Platelet    Standing Status:   Future    Standing Expiration Date:   02/15/2021  . Comprehensive metabolic panel    Standing Status:   Future    Standing Expiration Date:   02/15/2021  . CA 125    Standing Status:   Future    Standing Expiration Date:   02/15/2021    Earlie Server, MD, PhD Hematology Oncology [facility] Pager- [phone] 02/16/20

## 2020-02-26 ENCOUNTER — Inpatient Hospital Stay: Payer: Medicare Other

## 2020-02-26 DIAGNOSIS — Z90722 Acquired absence of ovaries, bilateral: Secondary | ICD-10-CM | POA: Diagnosis not present

## 2020-02-26 DIAGNOSIS — Z9221 Personal history of antineoplastic chemotherapy: Secondary | ICD-10-CM | POA: Diagnosis not present

## 2020-02-26 DIAGNOSIS — Z90710 Acquired absence of both cervix and uterus: Secondary | ICD-10-CM | POA: Diagnosis not present

## 2020-02-26 DIAGNOSIS — C569 Malignant neoplasm of unspecified ovary: Secondary | ICD-10-CM | POA: Diagnosis not present

## 2020-02-26 DIAGNOSIS — Z95828 Presence of other vascular implants and grafts: Secondary | ICD-10-CM

## 2020-02-26 DIAGNOSIS — Z79899 Other long term (current) drug therapy: Secondary | ICD-10-CM | POA: Diagnosis not present

## 2020-02-26 DIAGNOSIS — D696 Thrombocytopenia, unspecified: Secondary | ICD-10-CM | POA: Diagnosis not present

## 2020-02-26 MED ORDER — HEPARIN SOD (PORK) LOCK FLUSH 100 UNIT/ML IV SOLN
500.0000 [IU] | Freq: Once | INTRAVENOUS | Status: AC
  Administered 2020-02-26: 500 [IU] via INTRAVENOUS
  Filled 2020-02-26: qty 5

## 2020-02-26 MED ORDER — HEPARIN SOD (PORK) LOCK FLUSH 100 UNIT/ML IV SOLN
INTRAVENOUS | Status: AC
  Filled 2020-02-26: qty 5

## 2020-02-26 MED ORDER — SODIUM CHLORIDE 0.9% FLUSH
10.0000 mL | Freq: Once | INTRAVENOUS | Status: AC
  Administered 2020-02-26: 10 mL via INTRAVENOUS
  Filled 2020-02-26: qty 10

## 2020-03-17 ENCOUNTER — Encounter: Payer: Self-pay | Admitting: Obstetrics and Gynecology

## 2020-03-17 ENCOUNTER — Inpatient Hospital Stay: Payer: Medicare Other | Attending: Obstetrics and Gynecology | Admitting: Obstetrics and Gynecology

## 2020-03-17 VITALS — BP 112/61 | HR 66 | Temp 97.9°F | Resp 18 | Wt 122.7 lb

## 2020-03-17 DIAGNOSIS — C775 Secondary and unspecified malignant neoplasm of intrapelvic lymph nodes: Secondary | ICD-10-CM | POA: Insufficient documentation

## 2020-03-17 DIAGNOSIS — Z90722 Acquired absence of ovaries, bilateral: Secondary | ICD-10-CM | POA: Diagnosis not present

## 2020-03-17 DIAGNOSIS — C569 Malignant neoplasm of unspecified ovary: Secondary | ICD-10-CM | POA: Diagnosis present

## 2020-03-17 DIAGNOSIS — Z79899 Other long term (current) drug therapy: Secondary | ICD-10-CM | POA: Insufficient documentation

## 2020-03-17 DIAGNOSIS — Z9221 Personal history of antineoplastic chemotherapy: Secondary | ICD-10-CM | POA: Diagnosis not present

## 2020-03-17 DIAGNOSIS — Z90710 Acquired absence of both cervix and uterus: Secondary | ICD-10-CM | POA: Insufficient documentation

## 2020-03-17 NOTE — Progress Notes (Signed)
Gynecologic Oncology Interval Visit   Referring Provider: Dr. 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 G0P0 female with locally recurrent advanced ovarian cancer, currently on olaparib since 2/20 with Dr Tasia Catchings, who presents to clinic for follow up.   She was diagnosed with high grade serous ovarian cancer (HRD) on CT directed node biopsy with partial bowel obstruction and extensive bulky adenopathy on 11/2016 and received 4 cycles of carbo-taxol with dramatic decline in CA 125 and improvement in imaging. She underwent interval debulking surgery to no gross residual disease on 03/14/2017 followed by re-initiation of chemotherapy on 03/28/17 with normalization of CA-125. She had a platinum sensitive recurrence and received carbo-taxol recurred and received carbo-taxol 03/15/2018-04/26/2018 followed by olaparib since 2/20 for somatic HRD positive malignancy. Initially CA 125 rose, then normalized. Thought to have radiologic progression, discussed at [facility] and recommendation to continue parp given clinically asymptomatic.   Last imaging 12/29/19 was essentially stable. Doing well today with no complaints.   CA 125  02/13/20 42.2  01/01/20 56.9  12/02/19 64.9  10/28/19 58   07/25/19  82   3/21   76  2/21   67  04/14/19   47.0  02/12/20  29.1  12/18/2018  54.8  Oncology History: Katie Hill is a pleasant. G0P0 female with advanced ovarian cancer.  She presented to the ER 11/13/2016 with symptoms of right lower quadrant pain for 3-4 weeks, associated with nausea, bloating, no change in weight, urinary frequency, and change in bowel habits with more diffucult and stringy BM's.  CT CHEST, ABDOMEN, AND PELVIS WITH CONTRAST IMPRESSION: 1. Large heterogeneous central pelvic mass measures up to 12.6 cm. The lesion generates substantial mass-effect on the uterus, bladder, and pelvic sidewall anatomy. The mass appears to  invade the mid sigmoid colon. Etiology of the central pelvic mass is not definitive by CT, but ovarian primary is suspected. The lesion becomes indistinguishable from the posterior uterus on some images and uterine origin is possible, but the uterus does not appear to be the epicenter of the mass and appears to be more displaced by it. MRI of the pelvis without and with contrast may prove helpful to better delineate the relationship of the uterus to the central pelvic mass although it may not be able to definitively localize the origin. 2. Bulky retroperitoneal and left pelvic sidewall lymphadenopathy. The retroperitoneal lymphadenopathy generates substantial mass-effect on the IVC and left renal vein. The external iliac veins along each pelvic sidewall are markedly attenuated by the mass/lymphadenopathy. Patency of these vessels cannot be definitely confirmed on this exam.  11/16/16 LYMPH NODE, LEFT RETROPERITONEAL; CT-GUIDED CORE BIOPSY:  - METASTATIC HIGH-GRADE SEROUS CARCINOMA.   Decision was made to do neoadjuvant carbo/taxol chemotherapy.  CA125 fell from 4,382 to 64.6 (10/29).  CT scan showed dramatic response.  CT scan 10/14 Vascular/Lymphatic: Normal appearance of the abdominal aorta. Interval decrease in size of previously identified bulky retroperitoneal adenopathy. Index aortocaval node measures 1.6 x 2.6 cm, image 32 of series 2. Previously 4.4 x 5.3 cm. Index left periaortic nodal mass measures 2.2 x 1.3 cm, image 31 of series 2. Previously 3.7 x 4.7 cm. At the level of the bifurcation there is a low-attenuation nodal mass which measure 3.1 x 2.6 cm, image 44 of series 2. Previously 4.3 x 4.8 cm. Left common iliac node measures 1.2 cm, image 53 of series 2. Previously 2.2 cm. Left external iliac  node measures 1.6 x 1.1 cm, image 67 of series 2. Previously 4.8 x 2.6 cm. Reproductive: Large mass centered around the uterus measures 9.4 x 5.5 cm, image 67 of series 2. Previously this measured  12.6 x 9.2 cm  On 03/14/2017 she underwent L/S converted to XL, TAH, BSO, right PA node resection, omentectomy, and LOA to no residual disease.   Pathology 03/14/2017  DIAGNOSIS:  A. OMENTUM; OMENTECTOMY:  - NO TUMOR SEEN.  - ONE NEGATIVE LYMPH NODE (0/1).   B. RIGHT FALLOPIAN TUBE AND OVARY; SALPINGO-OOPHORECTOMY:  - SMALL FOCI OF HIGH GRADE SEROUS CARCINOMA INVOLVING THE OVARY.  - MARKED THERAPY RELATED CHANGE.  - NO TUMOR SEEN IN THE FALLOPIAN TUBE.   C. UTERUS, CERVIX, LEFT FALLOPIAN TUBE AND OVARY; HYSTERECTOMY AND LEFT  SALPINGO-OOPHORECTOMY:  - NABOTHIAN CYSTS.  - CYSTIC ATROPHY OF THE ENDOMETRIUM.  - SEROSAL ADHESIONS.  - UNREMARKABLE FALLOPIAN TUBE.  - OVARY SHOWING TREATMENT RELATED CHANGE.   D. 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   Then completed an additional 3 cycles of carbo/Taxol and CA 125 was 7.4 05/09/17.  CA 125  14 in 5/19  21 in 8/19.   CT scan 11/22/17 IMPRESSION: 1. Interval debulking surgery with decrease in retroperitoneal and extraperitoneal lymphadenopathy. In some areas the lymphadenopathy has resolved completely. Peritoneal implant seen along the falciform ligament on the prior study has decreased. 2. 4 x 2 cm collection of complex fluid or soft tissue in the right cul-de-sac is in the region of the complex mass lesion seen on the previous study. Given that there is some apparent enhancement peripherally, residual/recurrent disease is a concern and close attention will be required. 3. Stable tiny bilateral pulmonary nodules with no pleural effusion.  CA125  25.7 12/24/17 46.3 02/18/18  02/28/2018- CT C/A/P  1. Compared to the prior examination there has been interval enlargement of an aortocaval lymph node which currently measures 1.4 cm in short axis (axial image 75 of series 2). This lymph node is in close proximity to the previously resected retroperitoneal  lymphadenopathy and is very concerning for an additional nodal metastasis. 2. Other findings in the chest, abdomen and pelvis appear very similar to prior examinations, as discussed above. 3. Aortic atherosclerosis.  She received 3 cycles of carbo-Taxol 03/15/2018-04/26/2018. CA 125 improved to 6.0. She started olaparib 300 mg BID 2/20 for platinum-sensitive recurrent ovarian cancer (HRD positive).   CA125 04/05/2018    15.3 04/26/2018       6.3 05/17/2018         6.0 06/07/2018        6.9 06/21/2018 7.0 07/05/2018 8.4 08/02/2018 18.7 08/29/2018 61.2 (H) 09/23/2018  111.0 (H) 10/21/2018 90.5 (H)    11/04/18 MRI Abd/Pelvis IMPRESSION: 1. Interval progression of a retrocaval lymph node in the abdomen, now measuring 2.7 x 2.3 cm and concerning for progression of metastatic disease. 2. Aortocaval lymph node seen as decreased on the prior study is stable. This is unenlarged by MR criteria. 3. 3.3 x 1.6 cm homogeneous lesion posterior right pelvis is smaller than 02/28/2018. This is homogeneous and shows thin, smooth peripheral enhancement with no evidence for central enhancement and likely represents a chronic postoperative collection although metastatic disease not entirely excluded.  She continued on olaparib (she only stopped for about a 1/2 a day) and her CA125 values declined. She saw Dr. Tasia Catchings on 02/12/2019. She had chronic anemia and grade 1 thrombocytopenia, secondary to olaparib. Otherwise tolerated treatment very  well.   05/08/2019 CT C/A/P IMPRESSION: 1. Mild progression of metastatic disease. Mildly enlarged aortocaval lymph node is newly enlarged since 11/05/2018 MRI and 08/28/2018 CT studies. Separate enlarged retrocaval lymph node is stable since 11/05/2018 MRI and increased since 08/28/2018 CT. 2. Stable posterior right pelvic soft tissue focus, characterized as probable chronic postsurgical collection on prior MRI. 3.  Aortic Atherosclerosis (ICD10-I70.0).  Reviewed imaging 08/2018; 10/2018;  04/2019; Measurement vary on imaging reads. She does not have a baseline CT scan from 05/2018 1.Enlarged retrocaval lymph node measures 2.2 cm short axis diameter (series 2/image 29), compared to 2.2 cm 11/05/2018 MRI using similar measuring technique and 1.5 cm on 08/28/2018 CT. 2. Posterior pelvic 3.2 x 1.9 cm soft tissue focus just to the right of midline (series 2/image 64), previously 3.5 x 1.6 cm on 08/28/2018 CT. 3.3 x 1.6 cm  On MRI 10/2018. 3. New enlarged PA node 1.5 cm (series 2/image 29)  CT 3/21  IMPRESSION: 1. Previously noted retrocaval lymph node is stable, while the adjacent aortocaval lymph node has slightly increased in size compared to the prior examination. Previously noted soft tissue attenuation lesion in the posterior aspect of the anatomic pelvis appear stable compared to the prior study. No other new sites of metastatic disease are noted elsewhere in the chest, abdomen or pelvis. 2. Previously noted small pulmonary nodules are all stable in number and size compared to the prior examinations, favored to be benign. 3. Aortic atherosclerosis.  CT 09/29/19 1. On balance, stable disease in the retroperitoneum with enlargement of the smaller of the 2 lymph nodes on the prior study and decrease in size of the largest lymph node seen on the previous exam. 2. Mass along small bowel loops in the RIGHT hemipelvis measuring 3.6 x 1.5 cm, previously 3.6 x 1.7 cm. 3. Stable small nodules at the RIGHT lung base. 4. No new pulmonary nodule.   Genetic testing negative for 83 genes on [facility]'s Multi-Cancer panel (ALK, APC, ATM, AXIN2, BAP1, BARD1, BLM, BMPR1A, BRCA1, BRCA2, BRIP1, CASR, CDC73, CDH1, CDK4, CDKN1B, CDKN1C, CDKN2A, CEBPA, CHEK2, CTNNA1, DICER1, DIS3L2, EGFR, EPCAM, FH, FLCN, GATA2, GPC3, GREM1, HOXB13, HRAS, KIT, MAX, MEN1, MET, MITF, MLH1, MSH2, MSH3, MSH6, MUTYH, NBN, NF1, NF2, NTHL1, PALB2, PDGFRA, PHOX2B, PMS2, POLD1, POLE, POT1, PRKAR1A, PTCH1, PTEN, RAD50, RAD51C, RAD51D,  RB1, RECQL4, RET, RUNX1, SDHA, SDHAF2, SDHB, SDHC, SDHD, SMAD4, SMARCA4, SMARCB1, SMARCE1, STK11, SUFU, TERC, TERT, TMEM127, TP53, TSC1, TSC2, VHL, WRN, WT1).  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 This is still considered a normal result.   Somatic tumor testing:  She has HRD positive cancer, negative for somatic or germline BRCA mutation.   Patient Active Problem List   Diagnosis Date Noted  . High grade ovarian cancer ([place]) 12/04/2019  . Encounter for antineoplastic chemotherapy 04/14/2019  . Goals of care, counseling/discussion 03/09/2018  . Genetic testing 03/28/2017  . S/P total abdominal hysterectomy and bilateral salpingo-oophorectomy 03/14/2017  . Hyponatremia 12/12/2016  . Dehydration 12/08/2016  . Malignant neoplasm of ovary ([place]) 11/20/2016  . Pelvic mass 11/15/2016   Past Medical History:  Diagnosis Date  . Dysrhythmia   . Genetic testing 03/28/2017   Multi-Cancer panel (83 genes) @ [facility] - No pathogenic mutations detected  . High grade ovarian cancer ([place]) 11/20/2016  . Pelvic mass in female    Past Surgical History:  Procedure Laterality Date  . APPENDECTOMY    . LAPAROSCOPY N/A 03/14/2017   Procedure: LAPAROSCOPY OPERATIVE;  Surgeon: Mellody Drown, MD;  Location: [facility] OR[facility];  Service: Gynecology;  Laterality: N/A;  . LAPAROTOMY N/A 03/14/2017   Procedure: LAPAROTOMY;  Surgeon: Mellody Drown, MD;  Location: [facility] OR[facility];  Service: Gynecology;  Laterality: N/A;  . LYMPH NODE DISSECTION N/A 03/14/2017   Procedure: LYMPH NODE DISSECTION;  Surgeon: Mellody Drown, MD;  Location: [facility] OR[facility];  Service: Gynecology;  Laterality: N/A;  . OMENTECTOMY N/A 03/14/2017   Procedure: OMENTECTOMY;  Surgeon: Mellody Drown, MD;  Location: [facility] OR[facility];  Service: Gynecology;  Laterality: N/A;  . PORTA CATH INSERTION N/A 11/27/2016   Procedure: Glori Luis Cath Insertion;  Surgeon: Algernon Huxley, MD;  Location: [facility];  Service:  Cardiovascular;  Laterality: N/A;   Past Gynecologic History:  Menarche: 17 Last Menstrual Period: 24 years ago History of Abnormal pap: no Last pap: years ago She does not have regular medical cancer and has not has screening mammogram, colonoscopy, or Pap smears. She has not had an abnormal Pap.   OB History    Gravida  0   Para  0   Term  0   Preterm  0   AB  0   Living  0     SAB  0   TAB  0   Ectopic  0   Multiple  0   Live Births  0          Family History  Problem Relation Age of Onset  . Throat cancer Cousin   . Throat cancer Cousin   . Leukemia Cousin    Social History   Socioeconomic History  . Marital status: Married    Spouse name: Not on file  . Number of children: Not on file  . Years of education: Not on file  . Highest education level: Not on file  Occupational History  . Not on file  Tobacco Use  . Smoking status: Never Smoker  . Smokeless tobacco: Never Used  Vaping Use  . Vaping Use: Never used  Substance and Sexual Activity  . Alcohol use: Not Currently  . Drug use: No  . Sexual activity: Yes    Birth control/protection: Post-menopausal  Other Topics Concern  . Not on file  Social History Narrative  . Not on file   Social Determinants of Health   Financial Resource Strain:   . Difficulty of Paying Living Expenses: Not on file  Food Insecurity:   . Worried About Charity fundraiser in the Last Year: Not on file  . Ran Out of Food in the Last Year: Not on file  Transportation Needs:   . Lack of Transportation (Medical): Not on file  . Lack of Transportation (Non-Medical): Not on file  Physical Activity:   . Days of Exercise per Week: Not on file  . Minutes of Exercise per Session: Not on file  Stress:   . Feeling of Stress : Not on file  Social Connections:   . Frequency of Communication with Friends and Family: Not on file  . Frequency of Social Gatherings with Friends and Family: Not on file  . Attends Religious  Services: Not on file  . Active Member of Clubs or Organizations: Not on file  . Attends Archivist Meetings: Not on file  . Marital Status: Not on file   Allergies  Allergen Reactions  . Omeprazole Rash   Current Outpatient Medications on File Prior to Visit  Medication Sig Dispense Refill  . lidocaine-prilocaine (EMLA) cream Apply 1 application topically as needed. Apply small amount  to port site at least 1 hour prior to it being accessed, cover with plastic wrap 30 g 1  . olaparib (LYNPARZA) 150 MG tablet Take 2 tablets (300 mg total) by mouth 2 (two) times daily. Swallow whole. May take with food to decrease nausea and vomiting. 120 tablet 3  . vitamin B-12 (CYANOCOBALAMIN) 1000 MCG tablet Take 1 tablet (1,000 mcg total) by mouth daily. 90 tablet 4  . [DISCONTINUED] olaparib (LYNPARZA) 150 MG tablet Take 2 tablets (300 mg total) by mouth 2 (two) times daily. 120 tablet 2   No current facility-administered medications on file prior to visit.   Review of Systems General:  no complaints Skin: no complaints Eyes: no complaints HEENT: no complaints Breasts: no complaints Pulmonary: no complaints Cardiac: no complaints Gastrointestinal: no complaints Genitourinary/Sexual: no complaints Ob/Gyn: no complaints Musculoskeletal: no complaints Hematology: no complaints Neurologic/Psych: no complaints   Objective:  Physical Examination:  Today's Vitals   03/17/20 1059 03/17/20 1105  BP: 112/61   Pulse: 66   Resp: 18   Temp: 97.9 F (36.6 C)   TempSrc: Tympanic   SpO2: 100%   Weight: 122 lb 11.2 oz (55.7 kg)   PainSc:  0-No pain   Body mass index is 18.39 kg/m.  ECOG Performance Status: 1 - Symptomatic but completely ambulatory  GENERAL: Patient is a well appearing female in no acute distress HEENT:  Sclera clear. Anicteric NODES:  Negative axillary, supraclavicular, inguinal lymph node survery LUNGS:  Clear to auscultation bilaterally.   HEART:  Regular rate  and rhythm.  ABDOMEN:  Soft, nontender.  No hernias, incisions well healed. No masses or ascites EXTREMITIES:  No peripheral edema. Atraumatic. No cyanosis SKIN:  Clear with no obvious rashes or skin changes.  NEURO:  Nonfocal. Well oriented.  Appropriate affect.  Pelvic: EGBUS: no lesions Cervix: surgically absent Vagina: no lesions, no discharge or bleeding Uterus: surgically absent Adnexa: no palpable masses Rectovaginal: performed and confirmatory. No pelvic masses palpated.   Lab Results  Component Value Date   WBC 3.0 (L) 02/13/2020   HGB 10.9 (L) 02/13/2020   HCT 30.5 (L) 02/13/2020   MCV 115.1 (H) 02/13/2020   PLT 146 (L) 02/13/2020    Assessment:  Breanda Greenlaw is a 69 y.o. female diagnosed with high grade serous ovarian cancer (HRD) on CT directed node biopsy with partial bowel obstruction and extensive bulky adenopathy 8/18. s/p 4 cycles of carbo/taxol chemotherapy with dramatic decline in CA125 and improvement on CT scan. Interval debulking surgery to no gross residual on 03/14/2017.  Reinitiation of chemotherapy on 03/28/2017 with normalization of CA125.  Platinum-sensitive recurrence with normalization with re-induction of carbo-Taxol 03/15/2018-04/26/2018. On olaparib since 2/20 for HRD positive cancer, with initial CA125 rise and then normalization of CA125. CA125 elevated in the 40-80 range on Olaparib with mild pancytopenia. CT scan 6/21 without clear evidence of progression.  Decision made to continue on with Olaparib under Dr Tasia Catchings until clear sign of progression.   She does not have a germline mutation in BRCA1/2 or other related gene, but somatic tumor testing is positive for HRD.    Medical co-morbidities complicating care: prior abdominal surgery.  Plan:   Problem List Items Addressed This Visit      Endocrine   High grade ovarian cancer ([place]) - Primary     She will continue to see Dr. Tasia Catchings for evaluation on Olaparib. She is tolerating therapy well. CA125  trend and CT concerning for mild progressive disease, but not having  significant symptoms.   Return to [facility] clinic in 4 months.   If she has more significant progression of disease she would be treated with other FDA approved drugs including Doxil, gemcitabine and Avastin.  Also could be a candidate for clinical trial.  A total of over 25 minutes were spent with the patient/family today; >50% was spent in education, counseling and coordination of care for platinum-sensitive  high grade serous ovarian cancer (HRD) cancer.  Mellody Drown, MD  CC:  Gae Dry,  MD [address] [place], [place] [address]  Dr. Tasia Catchings

## 2020-03-19 ENCOUNTER — Other Ambulatory Visit: Payer: Self-pay

## 2020-03-19 ENCOUNTER — Inpatient Hospital Stay: Payer: Medicare Other

## 2020-03-19 DIAGNOSIS — Z79899 Other long term (current) drug therapy: Secondary | ICD-10-CM | POA: Diagnosis not present

## 2020-03-19 DIAGNOSIS — C569 Malignant neoplasm of unspecified ovary: Secondary | ICD-10-CM

## 2020-03-19 DIAGNOSIS — C775 Secondary and unspecified malignant neoplasm of intrapelvic lymph nodes: Secondary | ICD-10-CM | POA: Diagnosis not present

## 2020-03-19 DIAGNOSIS — Z90710 Acquired absence of both cervix and uterus: Secondary | ICD-10-CM | POA: Diagnosis not present

## 2020-03-19 DIAGNOSIS — Z9221 Personal history of antineoplastic chemotherapy: Secondary | ICD-10-CM | POA: Diagnosis not present

## 2020-03-19 DIAGNOSIS — Z90722 Acquired absence of ovaries, bilateral: Secondary | ICD-10-CM | POA: Diagnosis not present

## 2020-03-19 LAB — CBC WITH DIFFERENTIAL/PLATELET
Abs Immature Granulocytes: 0.01 10*3/uL (ref 0.00–0.07)
Basophils Absolute: 0 10*3/uL (ref 0.0–0.1)
Basophils Relative: 1 %
Eosinophils Absolute: 0.1 10*3/uL (ref 0.0–0.5)
Eosinophils Relative: 3 %
HCT: 31.3 % — ABNORMAL LOW (ref 36.0–46.0)
Hemoglobin: 11.3 g/dL — ABNORMAL LOW (ref 12.0–15.0)
Immature Granulocytes: 0 %
Lymphocytes Relative: 28 %
Lymphs Abs: 0.8 10*3/uL (ref 0.7–4.0)
MCH: 42 pg — ABNORMAL HIGH (ref 26.0–34.0)
MCHC: 36.1 g/dL — ABNORMAL HIGH (ref 30.0–36.0)
MCV: 116.4 fL — ABNORMAL HIGH (ref 80.0–100.0)
Monocytes Absolute: 0.3 10*3/uL (ref 0.1–1.0)
Monocytes Relative: 10 %
Neutro Abs: 1.7 10*3/uL (ref 1.7–7.7)
Neutrophils Relative %: 58 %
Platelets: 136 10*3/uL — ABNORMAL LOW (ref 150–400)
RBC: 2.69 MIL/uL — ABNORMAL LOW (ref 3.87–5.11)
RDW: 14.2 % (ref 11.5–15.5)
WBC: 2.9 10*3/uL — ABNORMAL LOW (ref 4.0–10.5)
nRBC: 0.7 % — ABNORMAL HIGH (ref 0.0–0.2)

## 2020-03-19 LAB — COMPREHENSIVE METABOLIC PANEL
ALT: 10 U/L (ref 0–44)
AST: 17 U/L (ref 15–41)
Albumin: 4.6 g/dL (ref 3.5–5.0)
Alkaline Phosphatase: 63 U/L (ref 38–126)
Anion gap: 9 (ref 5–15)
BUN: 10 mg/dL (ref 8–23)
CO2: 25 mmol/L (ref 22–32)
Calcium: 9.9 mg/dL (ref 8.9–10.3)
Chloride: 98 mmol/L (ref 98–111)
Creatinine, Ser: 0.83 mg/dL (ref 0.44–1.00)
GFR, Estimated: >60 mL/min (ref >60)
Glucose, Bld: 91 mg/dL (ref 70–99)
Potassium: 4.2 mmol/L (ref 3.5–5.1)
Sodium: 132 mmol/L — ABNORMAL LOW (ref 135–145)
Total Bilirubin: 0.8 mg/dL (ref 0.3–1.2)
Total Protein: 7.2 g/dL (ref 6.5–8.1)

## 2020-03-20 LAB — CA 125: Cancer Antigen (CA) 125: 47.7 U/mL — ABNORMAL HIGH (ref 0.0–38.1)

## 2020-03-22 ENCOUNTER — Ambulatory Visit
Admission: RE | Admit: 2020-03-22 | Discharge: 2020-03-22 | Disposition: A | Discharge status: Home / Self Care | Payer: Medicare Other | Source: Ambulatory Visit | Attending: Oncology | Admitting: Oncology

## 2020-03-22 ENCOUNTER — Other Ambulatory Visit: Payer: Self-pay

## 2020-03-22 DIAGNOSIS — R911 Solitary pulmonary nodule: Secondary | ICD-10-CM | POA: Diagnosis not present

## 2020-03-22 DIAGNOSIS — R590 Localized enlarged lymph nodes: Secondary | ICD-10-CM | POA: Diagnosis not present

## 2020-03-22 DIAGNOSIS — C569 Malignant neoplasm of unspecified ovary: Secondary | ICD-10-CM | POA: Insufficient documentation

## 2020-03-22 DIAGNOSIS — J984 Other disorders of lung: Secondary | ICD-10-CM | POA: Diagnosis not present

## 2020-03-22 MED ORDER — IOHEXOL 300 MG/ML  SOLN
85.0000 mL | Freq: Once | INTRAMUSCULAR | Status: AC | PRN
  Administered 2020-03-22: 08:00:00 85 mL via INTRAVENOUS

## 2020-04-01 ENCOUNTER — Encounter: Payer: Self-pay | Admitting: Oncology

## 2020-04-01 ENCOUNTER — Inpatient Hospital Stay (HOSPITAL_BASED_OUTPATIENT_CLINIC_OR_DEPARTMENT_OTHER): Payer: Medicare Other | Admitting: Oncology

## 2020-04-01 VITALS — BP 118/75 | HR 72 | Temp 98.8°F | Resp 16 | Wt 122.0 lb

## 2020-04-01 DIAGNOSIS — Z90710 Acquired absence of both cervix and uterus: Secondary | ICD-10-CM | POA: Diagnosis not present

## 2020-04-01 DIAGNOSIS — T451X5A Adverse effect of antineoplastic and immunosuppressive drugs, initial encounter: Secondary | ICD-10-CM

## 2020-04-01 DIAGNOSIS — Z90722 Acquired absence of ovaries, bilateral: Secondary | ICD-10-CM | POA: Diagnosis not present

## 2020-04-01 DIAGNOSIS — D696 Thrombocytopenia, unspecified: Secondary | ICD-10-CM | POA: Diagnosis not present

## 2020-04-01 DIAGNOSIS — C569 Malignant neoplasm of unspecified ovary: Secondary | ICD-10-CM

## 2020-04-01 DIAGNOSIS — D6481 Anemia due to antineoplastic chemotherapy: Secondary | ICD-10-CM | POA: Diagnosis not present

## 2020-04-01 DIAGNOSIS — Z9221 Personal history of antineoplastic chemotherapy: Secondary | ICD-10-CM | POA: Diagnosis not present

## 2020-04-01 DIAGNOSIS — Z5111 Encounter for antineoplastic chemotherapy: Secondary | ICD-10-CM | POA: Diagnosis not present

## 2020-04-01 DIAGNOSIS — Z79899 Other long term (current) drug therapy: Secondary | ICD-10-CM | POA: Diagnosis not present

## 2020-04-01 DIAGNOSIS — C775 Secondary and unspecified malignant neoplasm of intrapelvic lymph nodes: Secondary | ICD-10-CM | POA: Diagnosis not present

## 2020-04-01 DIAGNOSIS — Z95828 Presence of other vascular implants and grafts: Secondary | ICD-10-CM

## 2020-04-01 NOTE — Progress Notes (Signed)
[facility] Follow up visit  Patient Care Team: Patient, [name], [name], [name], [name], [name] She underwent  Neoadjuvant chemotherapy of carbo and taxol x 4  # Patient had debulking surgery on 03/14/2017. She had a laparoscopy with conversion to laparotomy, total hysterectomy, with bilateral salpingo oophorectomy, right aortic lymph node dissection, omentectomy. Pathology showed small foci of residual disease in ovary.   Genetic testing negative for83 genes on [facility]'s Multi-Cancer panel (ALK, APC, ATM, AXIN2, BAP1, BARD1, BLM, BMPR1A, BRCA1, BRCA2, BRIP1, CASR, CDC73, CDH1, CDK4, CDKN1B, CDKN1C, CDKN2A, CEBPA, CHEK2, CTNNA1, DICER1, DIS3L2, EGFR, EPCAM, FH, FLCN, GATA2, GPC3, GREM1, HOXB13, HRAS, KIT, MAX, MEN1, MET, MITF, MLH1, MSH2, MSH3, MSH6, MUTYH, NBN, NF1, NF2, NTHL1, PALB2, PDGFRA, PHOX2B, PMS2, POLD1, POLE, POT1, PRKAR1A, PTCH1, PTEN, RAD50, RAD51C, RAD51D, RB1, RECQL4, RET, RUNX1, SDHA, SDHAF2, SDHB, SDHC, SDHD, SMAD4, SMARCA4, SMARCB1, SMARCE1, STK11, SUFU, TERC, TERT, TMEM127, TP53, TSC1, TSC2, VHL, WRN, WT1).  A Variant of UncertainSignificancewas detected: CASRc.106G>A (p.Gly36Arg). Myraid testing negative for somatic BRACA1/2, positive for HRD.   # HRD positive, was referred to [facility] for clinical trials of Olarparib maintenance. She opted out.  #  Treatment:  Stage IIIC Ovarian cancer:  #s/p Carboplatin and taxol x 4 neoadjuvant chemotherapy followed by debulking surgery  03/14/2017 # 03/28/2017 S/p Adjuvant carbo and taxol x3   Local recurrence, CA125 one 46.3 03/15/2018-05/17/2018 Carboplatin and Taxol x 4. CA125 decrease from 49.7 to 6 after 4 cycles of treatment.  Started on Olarparib 337m BID on 05/17/2018.  # was seen by Dr. BFransisca Connorsfor further evaluation due to rising Ca1 25 and MRI abdomenPelvis on 11/05/2018 showed Interval progression of, now measuring 2.7 x 2.3 cm and concerning for progression of metastatic disease.retrocaval lymph node in the abdomen Olaparib was held for short period of time. Her CA125 trended down again.  Dr. BFransisca Connorsrecommending resume olaparib and continue monitor. If she has more significant progression of disease she may be a candidate for clinical trial or will be treated with other chemotherapy drugs including Doxil, gemcitabine and Avastin.   INTERVAL HISTORY 69y.o. female with above oncology history reviewed by me today presents for evaluation for chemotherapy for treatment of ovarian cancer  Patient was accompanied by her husband. She has no new compliant. Feeling well at baseline.  Appetite is good.  Her weight has been stable. . Review of Systems  Constitutional: Negative for appetite change, chills, fatigue and fever.  HENT:   Negative for hearing loss and voice change.   Eyes: Negative for eye problems.  Respiratory: Negative for chest tightness and cough.   Cardiovascular: Negative for chest pain.  Gastrointestinal: Negative for abdominal distention, abdominal pain and blood in stool.  Endocrine: Negative for hot flashes.  Genitourinary: Negative for bladder incontinence, difficulty urinating, frequency and hematuria.   Musculoskeletal: Negative for arthralgias.  Skin: Negative for itching and rash.  Neurological: Negative for extremity weakness.  Hematological: Negative for adenopathy.  Psychiatric/Behavioral: Negative for confusion.   MEDICAL HISTORY: Past Medical History:  Diagnosis Date  . Dysrhythmia    . Genetic  testing 03/28/2017   Multi-Cancer panel (83 genes) @ [facility] - No pathogenic mutations detected  . High grade ovarian cancer ([place]) 11/20/2016  . Pelvic mass in female     SURGICAL HISTORY: Past Surgical History:  Procedure Laterality Date  . APPENDECTOMY    . LAPAROSCOPY N/A 03/14/2017   Procedure: LAPAROSCOPY OPERATIVE;  Surgeon: Mellody Drown, MD;  Location: [facility];  Service: Gynecology;  Laterality: N/A;  . LAPAROTOMY N/A 03/14/2017   Procedure: LAPAROTOMY;  Surgeon: Mellody Drown, MD;  Location: [facility];  Service: Gynecology;  Laterality: N/A;  . LYMPH NODE DISSECTION N/A 03/14/2017   Procedure: LYMPH NODE DISSECTION;  Surgeon: Mellody Drown, MD;  Location: [facility];  Service: Gynecology;  Laterality: N/A;  . OMENTECTOMY N/A 03/14/2017   Procedure: OMENTECTOMY;  Surgeon: Mellody Drown, MD;  Location: [facility];  Service: Gynecology;  Laterality: N/A;  . PORTA CATH INSERTION N/A 11/27/2016   Procedure: Glori Luis Cath Insertion;  Surgeon: Algernon Huxley, MD;  Location: [place] [facility];  Service: Cardiovascular;  Laterality: N/A;    SOCIAL HISTORY: Social History   Tobacco Use  . Smoking status: Never Smoker  . Smokeless tobacco: Never Used  Vaping Use  . Vaping Use: Never used  Substance Use Topics  . Alcohol use: Not Currently  . Drug use: No     FAMILY HISTORY Family History  Problem Relation Age of Onset  . Throat cancer Cousin   . Throat cancer Cousin   . Leukemia Cousin     ALLERGIES:  is allergic to omeprazole.  MEDICATIONS:  Current Outpatient Medications  Medication Sig Dispense Refill  . lidocaine-prilocaine (EMLA) cream Apply 1 application topically as needed. Apply small amount to port site at least 1 hour prior to it being accessed, cover with plastic wrap 30 g 1  . olaparib (LYNPARZA) 150 MG tablet Take 2 tablets (300 mg total) by mouth 2 (two) times daily. Swallow whole. May take with food to decrease nausea and vomiting. 120 tablet  3  . vitamin B-12 (CYANOCOBALAMIN) 1000 MCG tablet Take 1 tablet (1,000 mcg total) by mouth daily. 90 tablet 4   No current facility-administered medications for this visit.    PHYSICAL EXAMINATION:  ECOG PERFORMANCE STATUS: 0 - Asymptomatic Vitals:   04/01/20 1004  BP: 118/75  Pulse: 72  Resp: 16  Temp: 98.8 F (37.1 C)    Filed Weights   04/01/20 1004  Weight: 122 lb (55.3 kg)     Physical Exam Constitutional:      General: She is not in acute distress.    Appearance: She is not diaphoretic.     Comments: Thin built, she walks independantly  HENT:     Head: Normocephalic and atraumatic.     Nose: Nose normal.     Mouth/Throat:     Pharynx: No oropharyngeal exudate.  Eyes:     General: No scleral icterus.    Pupils: Pupils are equal, round, and reactive to light.  Neck:     Vascular: No JVD.  Cardiovascular:     Rate and Rhythm: Normal rate and regular rhythm.     Heart sounds: No murmur heard.   Pulmonary:     Effort: Pulmonary effort is normal. No respiratory distress.     Breath sounds: Normal breath sounds. No rales.  Chest:     Chest wall: No tenderness.  Abdominal:     General: Bowel sounds are normal. There is no distension.     Palpations: Abdomen is soft.  Tenderness: There is no abdominal tenderness.  Musculoskeletal:        General: Normal range of motion.     Cervical back: Normal range of motion and neck supple.  Lymphadenopathy:     Cervical: No cervical adenopathy.  Skin:    General: Skin is warm and dry.     Findings: No erythema.     Comments: Right anterior medi port +   Neurological:     Mental Status: She is alert and oriented to person, place, and time.     Cranial Nerves: No cranial nerve deficit.     Motor: No abnormal muscle tone.     Coordination: Coordination normal.  Psychiatric:        Mood and Affect: Affect normal.        Judgment: Judgment normal.      LABORATORY DATA: I have personally reviewed the data as  listed: CBC    Component Value Date/Time   WBC 2.9 (L) 03/19/2020 1054   RBC 2.69 (L) 03/19/2020 1054   HGB 11.3 (L) 03/19/2020 1054   HCT 31.3 (L) 03/19/2020 1054   PLT 136 (L) 03/19/2020 1054   MCV 116.4 (H) 03/19/2020 1054   MCH 42.0 (H) 03/19/2020 1054   MCHC 36.1 (H) 03/19/2020 1054   RDW 14.2 03/19/2020 1054   LYMPHSABS 0.8 03/19/2020 1054   MONOABS 0.3 03/19/2020 1054   EOSABS 0.1 03/19/2020 1054   BASOSABS 0.0 03/19/2020 1054   CMP Latest Ref Rng & Units 03/19/2020 02/13/2020 01/01/2020  Glucose 70 - 99 mg/dL 91 129(H) 87  BUN 8 - 23 mg/dL '10 14 11  ' Creatinine 0.44 - 1.00 mg/dL 0.83 0.72 0.66  Sodium 135 - 145 mmol/L 132(L) 134(L) 133(L)  Potassium 3.5 - 5.1 mmol/L 4.2 3.8 4.0  Chloride 98 - 111 mmol/L 98 100 101  CO2 22 - 32 mmol/L '25 27 23  ' Calcium 8.9 - 10.3 mg/dL 9.9 9.9 9.5  Total Protein 6.5 - 8.1 g/dL 7.2 7.3 7.1  Total Bilirubin 0.3 - 1.2 mg/dL 0.8 0.7 0.8  Alkaline Phos 38 - 126 U/L 63 59 58  AST 15 - 41 U/L '17 16 15  ' ALT 0 - 44 U/L '10 9 8    ' Pathology 11/16/2016 Surgical Pathology  CASE: [id number]  PATIENT: Kyriana Rollo  Surgical Pathology Report   SPECIMEN SUBMITTED:  A. Retroperitoneal adenopathy, left  DIAGNOSIS:  A. LYMPH NODE, LEFT RETROPERITONEAL; CT-GUIDED CORE BIOPSY:  - METASTATIC HIGH-GRADE SEROUS CARCINOMA.  Pathology 03/14/2017   DIAGNOSIS:  A. OMENTUM; OMENTECTOMY:  - NO TUMOR SEEN.  - ONE NEGATIVE LYMPH NODE (0/1).   B. RIGHT FALLOPIAN TUBE AND OVARY; SALPINGO-OOPHORECTOMY:  - SMALL FOCI OF HIGH GRADE SEROUS CARCINOMA INVOLVING THE OVARY.  - MARKED THERAPY RELATED CHANGE.  - NO TUMOR SEEN IN THE FALLOPIAN TUBE.   C. UTERUS, CERVIX, LEFT FALLOPIAN TUBE AND OVARY; HYSTERECTOMY AND LEFT  SALPINGO-OOPHORECTOMY:  - NABOTHIAN CYSTS.  - CYSTIC ATROPHY OF THE ENDOMETRIUM.  - SEROSAL ADHESIONS.  - UNREMARKABLE FALLOPIAN TUBE.  - OVARY SHOWING TREATMENT RELATED CHANGE.   D. 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    RADIOGRAPHIC STUDIES: I have personally reviewed the radiological images as listed and agreed with the findings in the report. CT CHEST ABDOMEN PELVIS W CONTRAST  Result Date: 03/22/2020 CLINICAL DATA:  Ovarian cancer.  Restaging. EXAM: CT CHEST, ABDOMEN, AND PELVIS WITH CONTRAST TECHNIQUE: Multidetector CT imaging of the chest, abdomen and pelvis  was performed following the standard protocol during bolus administration of intravenous contrast. CONTRAST:  44m OMNIPAQUE IOHEXOL 300 MG/ML  SOLN COMPARISON:  12/29/2019. FINDINGS: CT CHEST FINDINGS Cardiovascular: The heart size is normal. No substantial pericardial effusion. No thoracic aortic aneurysm. Right Port-A-Cath tip is positioned in the mid to distal SVC. Mediastinum/Nodes: No mediastinal lymphadenopathy. There is no hilar lymphadenopathy. The esophagus has normal imaging features. There is no axillary lymphadenopathy. Lungs/Pleura: Biapical pleuroparenchymal scarring again noted. 5 mm left lower lobe pulmonary nodule (99/3) is unchanged. No new suspicious nodule or mass. No focal airspace consolidation. No pleural effusion. Musculoskeletal: No worrisome lytic or sclerotic osseous abnormality. CT ABDOMEN PELVIS FINDINGS Hepatobiliary: No suspicious focal abnormality within the liver parenchyma. There is no evidence for gallstones, gallbladder wall thickening, or pericholecystic fluid. No intrahepatic or extrahepatic biliary dilation. Pancreas: No focal mass lesion. No dilatation of the main duct. No intraparenchymal cyst. No peripancreatic edema. Spleen: No splenomegaly. No focal mass lesion. Adrenals/Urinary Tract: No adrenal nodule or mass. Tiny hypodensities in each kidney are too small to characterize but stable and most likely benign. No evidence for hydroureter. Bladder is nondistended. Stomach/Bowel: Stomach is unremarkable. No gastric wall thickening. No evidence  of outlet obstruction. Duodenum is normally positioned as is the ligament of Treitz. No small bowel wall thickening. No small bowel dilatation. The terminal ileum is normal. The appendix is not visualized, but there is no edema or inflammation in the region of the cecum. No gross colonic mass. No colonic wall thickening. Prominent stool volume right and transverse segments of the colon. Vascular/Lymphatic: There is abdominal aortic atherosclerosis without aneurysm. No substantial change retroperitoneal lymphadenopathy. Aortocaval node on 72/2 is 1.9 cm short axis today, stable in the interval. Retrocaval node on 73/2 measures 1.6 cm today compared to 1.5 cm previously. No new lymphadenopathy in the abdomen. Lymphadenopathy does generates some mass-effect on the IVC. Portal vein, superior mesenteric vein, and splenic vein are patent. No pelvic sidewall lymphadenopathy. 3.3 x 1.3 cm focus of soft tissue is identified in the posterior pelvis, between small bowel loops and rectum. This measures minimally smaller today than on the prior exam. Reproductive: Uterus surgically absent.  There is no adnexal mass. Other: No intraperitoneal free fluid. Musculoskeletal: No worrisome lytic or sclerotic osseous abnormality. IMPRESSION: 1. Stable appearance of the 5 mm left lower lobe pulmonary nodule, retroperitoneal adenopathy, and posterior pelvic soft tissue. No new or progressive interval findings. 2. Prominent stool volume right and transverse segments of the colon. Imaging features could be compatible with clinical constipation. 3.  Aortic Atherosclerois (ICD10-170.0) Electronically Signed   By: EMisty StanleyM.D.   On: 03/22/2020 09:05    ASSESSMENT/PLAN Cancer Staging Malignant neoplasm of ovary ([facility] Staging form: Ovary, Fallopian Tube, and Primary Peritoneal Carcinoma, AJCC 8th Edition - Clinical stage from 11/22/2016: Stage IIIC (cT3c, cN1b, cM0) - Signed by YEarlie Server MD on 11/22/2016  1. [place]  cancer [place]   2. Encounter for antineoplastic chemotherapy   3. Thrombocytopenia ([place]   4. Port-A-Cath in place   5. Anemia associated with chemotherapy   : # Ovarian Cancer, local recurrence.  Currently on olaparib 300 mg twice daily. She tolerates well.  CA 125 18.7-->61.2-->111-->90.5-->69--> 54.8--> 29.1-->47-->71.2-->67.7->73.2-->82.8-->70.4-->66.4--> 58.4--> 64.9--> 56.9--> 42.2-->47.7 Interval CT chest abdomen pelvis was independently reviewed by me and discussed with patient. Stable disease.  5 mm left lower lobe pulmonary nodule, retroperitoneal adenopathy, posterior pelvic soft tissue no change.  No new or progressive disease. Continue olaparib 300 mg daily.  She tolerates well. . Chronic anemia, hemoglobin stable at 11.3.  Chemotherapy-induced.  Monitor... 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    Thrombocytopenia, grade 1, stable counts. Medi port, continue port flush every  8 weeks.  RTF 4 weeks  Orders Placed This Encounter  Procedures  . CBC with Differential/Platelet    Standing Status:   Future    Standing Expiration Date:   04/01/2021  . Comprehensive metabolic panel    Standing Status:   Future    Standing Expiration Date:   04/01/2021  . CA 125    Standing Status:   Future    Standing Expiration Date:   04/01/2021    Earlie Server, MD, PhD Hematology Oncology [facility] Pager- [phone] 04/01/20

## 2020-04-01 NOTE — Progress Notes (Signed)
Patient denies new problems/concerns today.   °

## 2020-04-27 ENCOUNTER — Other Ambulatory Visit: Payer: Self-pay

## 2020-04-27 ENCOUNTER — Inpatient Hospital Stay: Payer: Medicare Other | Attending: Oncology

## 2020-04-27 DIAGNOSIS — C569 Malignant neoplasm of unspecified ovary: Secondary | ICD-10-CM | POA: Diagnosis not present

## 2020-04-27 DIAGNOSIS — D6481 Anemia due to antineoplastic chemotherapy: Secondary | ICD-10-CM | POA: Diagnosis not present

## 2020-04-27 DIAGNOSIS — D696 Thrombocytopenia, unspecified: Secondary | ICD-10-CM | POA: Insufficient documentation

## 2020-04-27 DIAGNOSIS — D7589 Other specified diseases of blood and blood-forming organs: Secondary | ICD-10-CM | POA: Diagnosis not present

## 2020-04-27 LAB — COMPREHENSIVE METABOLIC PANEL
ALT: 8 U/L (ref 0–44)
AST: 16 U/L (ref 15–41)
Albumin: 4 g/dL (ref 3.5–5.0)
Alkaline Phosphatase: 57 U/L (ref 38–126)
Anion gap: 6 (ref 5–15)
BUN: 12 mg/dL (ref 8–23)
CO2: 24 mmol/L (ref 22–32)
Calcium: 9.5 mg/dL (ref 8.9–10.3)
Chloride: 100 mmol/L (ref 98–111)
Creatinine, Ser: 0.66 mg/dL (ref 0.44–1.00)
GFR, Estimated: >60 mL/min (ref >60)
Glucose, Bld: 101 mg/dL — ABNORMAL HIGH (ref 70–99)
Potassium: 3.9 mmol/L (ref 3.5–5.1)
Sodium: 130 mmol/L — ABNORMAL LOW (ref 135–145)
Total Bilirubin: 0.6 mg/dL (ref 0.3–1.2)
Total Protein: 6.9 g/dL (ref 6.5–8.1)

## 2020-04-27 LAB — CBC WITH DIFFERENTIAL/PLATELET
Abs Immature Granulocytes: 0.03 10*3/uL (ref 0.00–0.07)
Basophils Absolute: 0 10*3/uL (ref 0.0–0.1)
Basophils Relative: 1 %
Eosinophils Absolute: 0.1 10*3/uL (ref 0.0–0.5)
Eosinophils Relative: 4 %
HCT: 29.4 % — ABNORMAL LOW (ref 36.0–46.0)
Hemoglobin: 10.6 g/dL — ABNORMAL LOW (ref 12.0–15.0)
Immature Granulocytes: 1 %
Lymphocytes Relative: 22 %
Lymphs Abs: 0.6 10*3/uL — ABNORMAL LOW (ref 0.7–4.0)
MCH: 40.6 pg — ABNORMAL HIGH (ref 26.0–34.0)
MCHC: 36.1 g/dL — ABNORMAL HIGH (ref 30.0–36.0)
MCV: 112.6 fL — ABNORMAL HIGH (ref 80.0–100.0)
Monocytes Absolute: 0.4 10*3/uL (ref 0.1–1.0)
Monocytes Relative: 14 %
Neutro Abs: 1.7 10*3/uL (ref 1.7–7.7)
Neutrophils Relative %: 58 %
Platelets: 149 10*3/uL — ABNORMAL LOW (ref 150–400)
RBC: 2.61 MIL/uL — ABNORMAL LOW (ref 3.87–5.11)
RDW: 13.8 % (ref 11.5–15.5)
WBC: 2.9 10*3/uL — ABNORMAL LOW (ref 4.0–10.5)
nRBC: 0 % (ref 0.0–0.2)

## 2020-04-27 MED ORDER — HEPARIN SOD (PORK) LOCK FLUSH 100 UNIT/ML IV SOLN
500.0000 [IU] | Freq: Once | INTRAVENOUS | Status: AC
  Administered 2020-04-27: 500 [IU]
  Filled 2020-04-27: qty 5

## 2020-04-27 MED ORDER — HEPARIN SOD (PORK) LOCK FLUSH 100 UNIT/ML IV SOLN
INTRAVENOUS | Status: AC
  Filled 2020-04-27: qty 5

## 2020-04-27 MED ORDER — SODIUM CHLORIDE 0.9% FLUSH
10.0000 mL | Freq: Once | INTRAVENOUS | Status: AC
  Administered 2020-04-27: 10 mL
  Filled 2020-04-27: qty 10

## 2020-04-28 LAB — CA 125: Cancer Antigen (CA) 125: 54.4 U/mL — ABNORMAL HIGH (ref 0.0–38.1)

## 2020-04-29 ENCOUNTER — Inpatient Hospital Stay (HOSPITAL_BASED_OUTPATIENT_CLINIC_OR_DEPARTMENT_OTHER): Payer: Medicare Other | Admitting: Oncology

## 2020-04-29 ENCOUNTER — Encounter: Payer: Self-pay | Admitting: Oncology

## 2020-04-29 VITALS — BP 116/70 | HR 83 | Temp 98.6°F | Resp 16 | Wt 120.8 lb

## 2020-04-29 DIAGNOSIS — D7589 Other specified diseases of blood and blood-forming organs: Secondary | ICD-10-CM | POA: Diagnosis not present

## 2020-04-29 DIAGNOSIS — Z5111 Encounter for antineoplastic chemotherapy: Secondary | ICD-10-CM

## 2020-04-29 DIAGNOSIS — T451X5A Adverse effect of antineoplastic and immunosuppressive drugs, initial encounter: Secondary | ICD-10-CM

## 2020-04-29 DIAGNOSIS — D6481 Anemia due to antineoplastic chemotherapy: Secondary | ICD-10-CM | POA: Diagnosis not present

## 2020-04-29 DIAGNOSIS — D696 Thrombocytopenia, unspecified: Secondary | ICD-10-CM | POA: Diagnosis not present

## 2020-04-29 DIAGNOSIS — Z95828 Presence of other vascular implants and grafts: Secondary | ICD-10-CM

## 2020-04-29 DIAGNOSIS — C569 Malignant neoplasm of unspecified ovary: Secondary | ICD-10-CM | POA: Diagnosis not present

## 2020-04-29 MED ORDER — OLAPARIB 150 MG PO TABS: 300.0000 mg | ORAL_TABLET | Freq: Two times a day (BID) | ORAL | 1 refills | Status: DC

## 2020-04-29 NOTE — Progress Notes (Signed)
[facility] Follow up visit  Patient Care Team: Patient, [name], [name], [name], [name], MD as Referring Physician (Obstetrics and Gynecology)  CHIEF COMPLAINTS/PURPOSE OF Visit Follow up for chemotherapy tolerability of  ovarian cancer.  HISTORY OF PRESENTING ILLNESS: Katie Hill 70 y.o. female presents for follow up of management of stage IIIC ovarian cancer. She underwent  Neoadjuvant chemotherapy of carbo and taxol x 4  # Patient had debulking surgery on 03/14/2017. She had a laparoscopy with conversion to laparotomy, total hysterectomy, with bilateral salpingo oophorectomy, right aortic lymph node dissection, omentectomy. Pathology showed small foci of residual disease in ovary.   Genetic testing negative for83 genes on [facility]'s Multi-Cancer panel (ALK, APC, ATM, AXIN2, BAP1, BARD1, BLM, BMPR1A, BRCA1, BRCA2, BRIP1, CASR, CDC73, CDH1, CDK4, CDKN1B, CDKN1C, CDKN2A, CEBPA, CHEK2, CTNNA1, DICER1, DIS3L2, EGFR, EPCAM, FH, FLCN, GATA2, GPC3, GREM1, HOXB13, HRAS, KIT, MAX, MEN1, MET, MITF, MLH1, MSH2, MSH3, MSH6, MUTYH, NBN, NF1, NF2, NTHL1, PALB2, PDGFRA, PHOX2B, PMS2, POLD1, POLE, POT1, PRKAR1A, PTCH1, PTEN, RAD50, RAD51C, RAD51D, RB1, RECQL4, RET, RUNX1, SDHA, SDHAF2, SDHB, SDHC, SDHD, SMAD4, SMARCA4, SMARCB1, SMARCE1, STK11, SUFU, TERC, TERT, TMEM127, TP53, TSC1, TSC2, VHL, WRN, WT1).  A Variant of UncertainSignificancewas detected: CASRc.106G>A (p.Gly36Arg). Myraid testing negative for somatic BRACA1/2, positive for HRD.   # HRD positive, was referred to [facility] for clinical trials of Olarparib maintenance. She opted out.  #  Treatment:  Stage IIIC Ovarian cancer:  #s/p Carboplatin and taxol x 4 neoadjuvant chemotherapy followed by debulking surgery  03/14/2017 # 03/28/2017 S/p Adjuvant carbo and taxol x3   Local recurrence, CA125 one 46.3 03/15/2018-05/17/2018 Carboplatin and Taxol x 4. CA125 decrease from 49.7 to 6 after 4 cycles of treatment.  Started on Olarparib 321m BID on 05/17/2018.  # was seen by Dr. BFransisca Connorsfor further evaluation due to rising Ca1 25 and MRI abdomenPelvis on 11/05/2018 showed Interval progression of, now measuring 2.7 x 2.3 cm and concerning for progression of metastatic disease.retrocaval lymph node in the abdomen Olaparib was held for short period of time. Her CA125 trended down again.  Dr. BFransisca Connorsrecommending resume olaparib and continue monitor. If she has more significant progression of disease she may be a candidate for clinical trial or will be treated with other chemotherapy drugs including Doxil, gemcitabine and Avastin.   INTERVAL HISTORY 70y.o. female with above oncology history reviewed by me today presents for evaluation for chemotherapy for treatment of ovarian cancer  Patient was accompanied by her husband. Patient reports no new complaints.  Tolerating olaparib Denies any unintentional weight loss, fever, chills, abdominal pain  Review of Systems  Constitutional: Negative for appetite change, chills, fatigue and fever.  HENT:   Negative for hearing loss and voice change.   Eyes: Negative for eye problems.  Respiratory: Negative for chest tightness and cough.   Cardiovascular: Negative for chest pain.  Gastrointestinal: Negative for abdominal distention, abdominal pain and blood in stool.  Endocrine: Negative for hot flashes.  Genitourinary: Negative for difficulty urinating and frequency.   Musculoskeletal: Negative for arthralgias.  Skin: Negative for itching and rash.  Neurological: Negative for extremity weakness.  Hematological: Negative for adenopathy.  Psychiatric/Behavioral: Negative for confusion.     .Marland KitchenMEDICAL HISTORY: Past Medical History:  Diagnosis Date  . Dysrhythmia    . Genetic testing 03/28/2017  Multi-Cancer panel (83 genes) @ [facility] - No pathogenic mutations detected  . High grade ovarian cancer ([place]) 11/20/2016  . Pelvic mass in female     SURGICAL HISTORY: Past Surgical History:  Procedure Laterality Date  . APPENDECTOMY    . LAPAROSCOPY N/A 03/14/2017   Procedure: LAPAROSCOPY OPERATIVE;  Surgeon: Mellody Drown, MD;  Location: [facility] OR[facility];  Service: Gynecology;  Laterality: N/A;  . LAPAROTOMY N/A 03/14/2017   Procedure: LAPAROTOMY;  Surgeon: Mellody Drown, MD;  Location: [facility] OR[facility];  Service: Gynecology;  Laterality: N/A;  . LYMPH NODE DISSECTION N/A 03/14/2017   Procedure: LYMPH NODE DISSECTION;  Surgeon: Mellody Drown, MD;  Location: [facility] OR[facility];  Service: Gynecology;  Laterality: N/A;  . OMENTECTOMY N/A 03/14/2017   Procedure: OMENTECTOMY;  Surgeon: Mellody Drown, MD;  Location: [facility];  Service: Gynecology;  Laterality: N/A;  . PORTA CATH INSERTION N/A 11/27/2016   Procedure: Glori Luis Cath Insertion;  Surgeon: Algernon Huxley, MD;  Location: [place] [facility];  Service: Cardiovascular;  Laterality: N/A;    SOCIAL HISTORY: Social History   Tobacco Use  . Smoking status: Never Smoker  . Smokeless tobacco: Never Used  Vaping Use  . Vaping Use: Never used  Substance Use Topics  . Alcohol use: Not Currently  . Drug use: No     FAMILY HISTORY Family History  Problem Relation Age of Onset  . Throat cancer Cousin   . Throat cancer Cousin   . Leukemia Cousin     ALLERGIES:  is allergic to omeprazole.  MEDICATIONS:  Current Outpatient Medications  Medication Sig Dispense Refill  . lidocaine-prilocaine (EMLA) cream Apply 1 application topically as needed. Apply small amount to port site at least 1 hour prior to it being accessed, cover with plastic wrap 30 g 1  . vitamin B-12 (CYANOCOBALAMIN) 1000 MCG tablet Take 1 tablet (1,000 mcg total) by mouth daily. 90 tablet 4  . olaparib (LYNPARZA) 150 MG tablet Take 2 tablets (300 mg  total) by mouth 2 (two) times daily. Swallow whole. May take with food to decrease nausea and vomiting. 120 tablet 1   No current facility-administered medications for this visit.    PHYSICAL EXAMINATION:  ECOG PERFORMANCE STATUS: 0 - Asymptomatic Vitals:   04/29/20 1010  BP: 116/70  Pulse: 83  Resp: 16  Temp: 98.6 F (37 C)    Filed Weights   04/29/20 1010  Weight: 120 lb 12.8 oz (54.8 kg)     Physical Exam Constitutional:      General: She is not in acute distress.    Appearance: She is not diaphoretic.     Comments: Thin built, she walks independantly  HENT:     Head: Normocephalic and atraumatic.     Nose: Nose normal.     Mouth/Throat:     Pharynx: No oropharyngeal exudate.  Eyes:     General: No scleral icterus.    Pupils: Pupils are equal, round, and reactive to light.  Neck:     Vascular: No JVD.  Cardiovascular:     Rate and Rhythm: Normal rate and regular rhythm.     Heart sounds: No murmur heard.   Pulmonary:     Effort: Pulmonary effort is normal. No respiratory distress.     Breath sounds: Normal breath sounds. No rales.  Chest:     Chest wall: No tenderness.  Abdominal:     General: Bowel sounds are normal. There is no distension.     Palpations: Abdomen is soft.  Tenderness: There is no abdominal tenderness.  Musculoskeletal:        General: Normal range of motion.     Cervical back: Normal range of motion and neck supple.  Lymphadenopathy:     Cervical: No cervical adenopathy.  Skin:    General: Skin is warm and dry.     Findings: No erythema.     Comments: Right anterior medi port +   Neurological:     Mental Status: She is alert and oriented to person, place, and time.     Cranial Nerves: No cranial nerve deficit.     Motor: No abnormal muscle tone.     Coordination: Coordination normal.  Psychiatric:        Mood and Affect: Affect normal.        Judgment: Judgment normal.      LABORATORY DATA: I have personally reviewed  the data as listed: CBC    Component Value Date/Time   WBC 2.9 (L) 04/27/2020 1051   RBC 2.61 (L) 04/27/2020 1051   HGB 10.6 (L) 04/27/2020 1051   HCT 29.4 (L) 04/27/2020 1051   PLT 149 (L) 04/27/2020 1051   MCV 112.6 (H) 04/27/2020 1051   MCH 40.6 (H) 04/27/2020 1051   MCHC 36.1 (H) 04/27/2020 1051   RDW 13.8 04/27/2020 1051   LYMPHSABS 0.6 (L) 04/27/2020 1051   MONOABS 0.4 04/27/2020 1051   EOSABS 0.1 04/27/2020 1051   BASOSABS 0.0 04/27/2020 1051   CMP Latest Ref Rng & Units 04/27/2020 03/19/2020 02/13/2020  Glucose 70 - 99 mg/dL 101(H) 91 129(H)  BUN 8 - 23 mg/dL '12 10 14  ' Creatinine 0.44 - 1.00 mg/dL 0.66 0.83 0.72  Sodium 135 - 145 mmol/L 130(L) 132(L) 134(L)  Potassium 3.5 - 5.1 mmol/L 3.9 4.2 3.8  Chloride 98 - 111 mmol/L 100 98 100  CO2 22 - 32 mmol/L '24 25 27  ' Calcium 8.9 - 10.3 mg/dL 9.5 9.9 9.9  Total Protein 6.5 - 8.1 g/dL 6.9 7.2 7.3  Total Bilirubin 0.3 - 1.2 mg/dL 0.6 0.8 0.7  Alkaline Phos 38 - 126 U/L 57 63 59  AST 15 - 41 U/L '16 17 16  ' ALT 0 - 44 U/L '8 10 9    ' Pathology 11/16/2016 Surgical Pathology  CASE: [id number]  PATIENT: Katie Hill  Surgical Pathology Report   SPECIMEN SUBMITTED:  A. Retroperitoneal adenopathy, left  DIAGNOSIS:  A. LYMPH NODE, LEFT RETROPERITONEAL; CT-GUIDED CORE BIOPSY:  - METASTATIC HIGH-GRADE SEROUS CARCINOMA.  Pathology 03/14/2017   DIAGNOSIS:  A. OMENTUM; OMENTECTOMY:  - NO TUMOR SEEN.  - ONE NEGATIVE LYMPH NODE (0/1).   B. RIGHT FALLOPIAN TUBE AND OVARY; SALPINGO-OOPHORECTOMY:  - SMALL FOCI OF HIGH GRADE SEROUS CARCINOMA INVOLVING THE OVARY.  - MARKED THERAPY RELATED CHANGE.  - NO TUMOR SEEN IN THE FALLOPIAN TUBE.   C. UTERUS, CERVIX, LEFT FALLOPIAN TUBE AND OVARY; HYSTERECTOMY AND LEFT  SALPINGO-OOPHORECTOMY:  - NABOTHIAN CYSTS.  - CYSTIC ATROPHY OF THE ENDOMETRIUM.  - SEROSAL ADHESIONS.  - UNREMARKABLE FALLOPIAN TUBE.  - OVARY SHOWING TREATMENT RELATED CHANGE.   D. 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    RADIOGRAPHIC STUDIES: I have personally reviewed the radiological images as listed and agreed with the findings in the report. CT CHEST ABDOMEN PELVIS W CONTRAST  Result Date: 03/22/2020 CLINICAL DATA:  Ovarian cancer.  Restaging. EXAM: CT CHEST, ABDOMEN, AND PELVIS WITH CONTRAST TECHNIQUE: Multidetector CT imaging of the chest, abdomen and  pelvis was performed following the standard protocol during bolus administration of intravenous contrast. CONTRAST:  35m OMNIPAQUE IOHEXOL 300 MG/ML  SOLN COMPARISON:  12/29/2019. FINDINGS: CT CHEST FINDINGS Cardiovascular: The heart size is normal. No substantial pericardial effusion. No thoracic aortic aneurysm. Right Port-A-Cath tip is positioned in the mid to distal SVC. Mediastinum/Nodes: No mediastinal lymphadenopathy. There is no hilar lymphadenopathy. The esophagus has normal imaging features. There is no axillary lymphadenopathy. Lungs/Pleura: Biapical pleuroparenchymal scarring again noted. 5 mm left lower lobe pulmonary nodule (99/3) is unchanged. No new suspicious nodule or mass. No focal airspace consolidation. No pleural effusion. Musculoskeletal: No worrisome lytic or sclerotic osseous abnormality. CT ABDOMEN PELVIS FINDINGS Hepatobiliary: No suspicious focal abnormality within the liver parenchyma. There is no evidence for gallstones, gallbladder wall thickening, or pericholecystic fluid. No intrahepatic or extrahepatic biliary dilation. Pancreas: No focal mass lesion. No dilatation of the main duct. No intraparenchymal cyst. No peripancreatic edema. Spleen: No splenomegaly. No focal mass lesion. Adrenals/Urinary Tract: No adrenal nodule or mass. Tiny hypodensities in each kidney are too small to characterize but stable and most likely benign. No evidence for hydroureter. Bladder is nondistended. Stomach/Bowel: Stomach is unremarkable. No gastric wall  thickening. No evidence of outlet obstruction. Duodenum is normally positioned as is the ligament of Treitz. No small bowel wall thickening. No small bowel dilatation. The terminal ileum is normal. The appendix is not visualized, but there is no edema or inflammation in the region of the cecum. No gross colonic mass. No colonic wall thickening. Prominent stool volume right and transverse segments of the colon. Vascular/Lymphatic: There is abdominal aortic atherosclerosis without aneurysm. No substantial change retroperitoneal lymphadenopathy. Aortocaval node on 72/2 is 1.9 cm short axis today, stable in the interval. Retrocaval node on 73/2 measures 1.6 cm today compared to 1.5 cm previously. No new lymphadenopathy in the abdomen. Lymphadenopathy does generates some mass-effect on the IVC. Portal vein, superior mesenteric vein, and splenic vein are patent. No pelvic sidewall lymphadenopathy. 3.3 x 1.3 cm focus of soft tissue is identified in the posterior pelvis, between small bowel loops and rectum. This measures minimally smaller today than on the prior exam. Reproductive: Uterus surgically absent.  There is no adnexal mass. Other: No intraperitoneal free fluid. Musculoskeletal: No worrisome lytic or sclerotic osseous abnormality. IMPRESSION: 1. Stable appearance of the 5 mm left lower lobe pulmonary nodule, retroperitoneal adenopathy, and posterior pelvic soft tissue. No new or progressive interval findings. 2. Prominent stool volume right and transverse segments of the colon. Imaging features could be compatible with clinical constipation. 3.  Aortic Atherosclerois (ICD10-170.0) Electronically Signed   By: EMisty StanleyM.D.   On: 03/22/2020 09:05    ASSESSMENT/PLAN Cancer Staging Malignant neoplasm of ovary ([facility] Staging form: Ovary, Fallopian Tube, and Primary Peritoneal Carcinoma, AJCC 8th Edition - Clinical stage from 11/22/2016: Stage IIIC (cT3c, cN1b, cM0) - Signed by YEarlie Server MD on 11/22/2016  1.  High grade ovarian cancer ([place]   2. Malignant neoplasm of ovary, unspecified laterality ([place]   3. Port-A-Cath in place   4. Thrombocytopenia ([place]   5. Encounter for antineoplastic chemotherapy   6. Anemia associated with chemotherapy   : # Ovarian Cancer, local recurrence.  Currently on olaparib 300 mg twice daily. She tolerates well.  CA 125 18.7-->61.2-->111-->90.5-->69--> 54.8--> 29.1-->47-->71.2-->67.7->73.2-->82.8-->70.4-->66.4--> 58.4--> 64.9--> 56.9--> 42.2-->47.7-->54.4 Continue olaparib 300 mg daily.  She tolerates well.  Labs are reviewed and discussed with patient.  . Chronic anemia, hemoglobin stable at 11.3.  Chemotherapy-induced.  Monitor... 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    Thrombocytopenia, grade 1, stable counts.  Medi port, continue port flush every  8 weeks.  RTF 6 weeks  Orders Placed This Encounter  Procedures  . CBC with Differential/Platelet    Standing Status:   Future    Standing Expiration Date:   04/29/2021  . Comprehensive metabolic panel    Standing Status:   Future    Standing Expiration Date:   04/29/2021  . CA 125    Standing Status:   Future    Standing Expiration Date:   04/29/2021    Earlie Server, MD, PhD Hematology Oncology [facility] Pager- [phone] 04/29/20

## 2020-04-29 NOTE — Progress Notes (Signed)
Patient denies new problems/concerns today.   °

## 2020-06-08 ENCOUNTER — Inpatient Hospital Stay: Payer: Medicare Other | Attending: Oncology

## 2020-06-08 DIAGNOSIS — T451X5A Adverse effect of antineoplastic and immunosuppressive drugs, initial encounter: Secondary | ICD-10-CM | POA: Insufficient documentation

## 2020-06-08 DIAGNOSIS — C569 Malignant neoplasm of unspecified ovary: Secondary | ICD-10-CM | POA: Diagnosis not present

## 2020-06-08 DIAGNOSIS — Z79899 Other long term (current) drug therapy: Secondary | ICD-10-CM | POA: Insufficient documentation

## 2020-06-08 DIAGNOSIS — Z808 Family history of malignant neoplasm of other organs or systems: Secondary | ICD-10-CM | POA: Diagnosis not present

## 2020-06-08 DIAGNOSIS — D6481 Anemia due to antineoplastic chemotherapy: Secondary | ICD-10-CM | POA: Diagnosis not present

## 2020-06-08 DIAGNOSIS — Z806 Family history of leukemia: Secondary | ICD-10-CM | POA: Diagnosis not present

## 2020-06-08 DIAGNOSIS — I700 Atherosclerosis of aorta: Secondary | ICD-10-CM | POA: Insufficient documentation

## 2020-06-08 LAB — COMPREHENSIVE METABOLIC PANEL
ALT: 9 U/L (ref 0–44)
AST: 16 U/L (ref 15–41)
Albumin: 4.5 g/dL (ref 3.5–5.0)
Alkaline Phosphatase: 70 U/L (ref 38–126)
Anion gap: 9 (ref 5–15)
BUN: 15 mg/dL (ref 8–23)
CO2: 25 mmol/L (ref 22–32)
Calcium: 9.8 mg/dL (ref 8.9–10.3)
Chloride: 100 mmol/L (ref 98–111)
Creatinine, Ser: 0.72 mg/dL (ref 0.44–1.00)
GFR, Estimated: >60 mL/min (ref >60)
Glucose, Bld: 121 mg/dL — ABNORMAL HIGH (ref 70–99)
Potassium: 3.9 mmol/L (ref 3.5–5.1)
Sodium: 134 mmol/L — ABNORMAL LOW (ref 135–145)
Total Bilirubin: 0.7 mg/dL (ref 0.3–1.2)
Total Protein: 7.3 g/dL (ref 6.5–8.1)

## 2020-06-08 LAB — CBC WITH DIFFERENTIAL/PLATELET
Abs Immature Granulocytes: 0.01 10*3/uL (ref 0.00–0.07)
Basophils Absolute: 0 10*3/uL (ref 0.0–0.1)
Basophils Relative: 1 %
Eosinophils Absolute: 0.1 10*3/uL (ref 0.0–0.5)
Eosinophils Relative: 3 %
HCT: 32.3 % — ABNORMAL LOW (ref 36.0–46.0)
Hemoglobin: 11.3 g/dL — ABNORMAL LOW (ref 12.0–15.0)
Immature Granulocytes: 0 %
Lymphocytes Relative: 29 %
Lymphs Abs: 0.9 10*3/uL (ref 0.7–4.0)
MCH: 40.6 pg — ABNORMAL HIGH (ref 26.0–34.0)
MCHC: 35 g/dL (ref 30.0–36.0)
MCV: 116.2 fL — ABNORMAL HIGH (ref 80.0–100.0)
Monocytes Absolute: 0.3 10*3/uL (ref 0.1–1.0)
Monocytes Relative: 9 %
Neutro Abs: 1.9 10*3/uL (ref 1.7–7.7)
Neutrophils Relative %: 58 %
Platelets: 150 10*3/uL (ref 150–400)
RBC: 2.78 MIL/uL — ABNORMAL LOW (ref 3.87–5.11)
RDW: 14.3 % (ref 11.5–15.5)
WBC: 3.3 10*3/uL — ABNORMAL LOW (ref 4.0–10.5)
nRBC: 0 % (ref 0.0–0.2)

## 2020-06-09 LAB — CA 125: Cancer Antigen (CA) 125: 68.2 U/mL — ABNORMAL HIGH (ref 0.0–38.1)

## 2020-06-10 ENCOUNTER — Encounter: Payer: Self-pay | Admitting: Oncology

## 2020-06-10 ENCOUNTER — Inpatient Hospital Stay (HOSPITAL_BASED_OUTPATIENT_CLINIC_OR_DEPARTMENT_OTHER): Payer: Medicare Other | Admitting: Oncology

## 2020-06-10 VITALS — BP 107/61 | HR 96 | Temp 98.5°F | Wt 121.7 lb

## 2020-06-10 DIAGNOSIS — I700 Atherosclerosis of aorta: Secondary | ICD-10-CM | POA: Diagnosis not present

## 2020-06-10 DIAGNOSIS — D696 Thrombocytopenia, unspecified: Secondary | ICD-10-CM

## 2020-06-10 DIAGNOSIS — D6481 Anemia due to antineoplastic chemotherapy: Secondary | ICD-10-CM

## 2020-06-10 DIAGNOSIS — Z95828 Presence of other vascular implants and grafts: Secondary | ICD-10-CM

## 2020-06-10 DIAGNOSIS — T451X5A Adverse effect of antineoplastic and immunosuppressive drugs, initial encounter: Secondary | ICD-10-CM | POA: Diagnosis not present

## 2020-06-10 DIAGNOSIS — Z808 Family history of malignant neoplasm of other organs or systems: Secondary | ICD-10-CM | POA: Diagnosis not present

## 2020-06-10 DIAGNOSIS — Z79899 Other long term (current) drug therapy: Secondary | ICD-10-CM | POA: Diagnosis not present

## 2020-06-10 DIAGNOSIS — Z5111 Encounter for antineoplastic chemotherapy: Secondary | ICD-10-CM

## 2020-06-10 DIAGNOSIS — C569 Malignant neoplasm of unspecified ovary: Secondary | ICD-10-CM | POA: Diagnosis not present

## 2020-06-10 NOTE — Progress Notes (Signed)
[facility] Follow up visit  Patient Care Team: Patient, [name], [name], [name], [name], MD as Referring Physician (Obstetrics and Gynecology)  CHIEF COMPLAINTS/PURPOSE OF Visit Follow up for chemotherapy tolerability of  ovarian cancer.  HISTORY OF PRESENTING ILLNESS: Katie Hill 70 y.o. female presents for follow up of management of stage IIIC ovarian cancer. She underwent  Neoadjuvant chemotherapy of carbo and taxol x 4  # Patient had debulking surgery on 03/14/2017. She had a laparoscopy with conversion to laparotomy, total hysterectomy, with bilateral salpingo oophorectomy, right aortic lymph node dissection, omentectomy. Pathology showed small foci of residual disease in ovary.   Genetic testing negative for83 genes on [facility]'s Multi-Cancer panel (ALK, APC, ATM, AXIN2, BAP1, BARD1, BLM, BMPR1A, BRCA1, BRCA2, BRIP1, CASR, CDC73, CDH1, CDK4, CDKN1B, CDKN1C, CDKN2A, CEBPA, CHEK2, CTNNA1, DICER1, DIS3L2, EGFR, EPCAM, FH, FLCN, GATA2, GPC3, GREM1, HOXB13, HRAS, KIT, MAX, MEN1, MET, MITF, MLH1, MSH2, MSH3, MSH6, MUTYH, NBN, NF1, NF2, NTHL1, PALB2, PDGFRA, PHOX2B, PMS2, POLD1, POLE, POT1, PRKAR1A, PTCH1, PTEN, RAD50, RAD51C, RAD51D, RB1, RECQL4, RET, RUNX1, SDHA, SDHAF2, SDHB, SDHC, SDHD, SMAD4, SMARCA4, SMARCB1, SMARCE1, STK11, SUFU, TERC, TERT, TMEM127, TP53, TSC1, TSC2, VHL, WRN, WT1).  A Variant of UncertainSignificancewas detected: CASRc.106G>A (p.Gly36Arg). Myraid testing negative for somatic BRACA1/2, positive for HRD.   # HRD positive, was referred to [facility] for clinical trials of Olarparib maintenance. She opted out.  #  Treatment:  Stage IIIC Ovarian cancer:  #s/p Carboplatin and taxol x 4 neoadjuvant chemotherapy followed by debulking surgery  03/14/2017 # 03/28/2017 S/p Adjuvant carbo and taxol x3   Local recurrence, CA125 one 46.3 03/15/2018-05/17/2018 Carboplatin and Taxol x 4. CA125 decrease from 49.7 to 6 after 4 cycles of treatment.  Started on Olarparib 348m BID on 05/17/2018.  # was seen by Dr. BFransisca Connorsfor further evaluation due to rising Ca1 25 and MRI abdomenPelvis on 11/05/2018 showed Interval progression of, now measuring 2.7 x 2.3 cm and concerning for progression of metastatic disease.retrocaval lymph node in the abdomen Olaparib was held for short period of time. Her CA125 trended down again.  Dr. BFransisca Connorsrecommending resume olaparib and continue monitor. If she has more significant progression of disease she may be a candidate for clinical trial or will be treated with other chemotherapy drugs including Doxil, gemcitabine and Avastin.   INTERVAL HISTORY 70y.o. female with above oncology history reviewed by me today presents for evaluation for chemotherapy for treatment of ovarian cancer  Patient was accompanied by her husband. Patient takes olaparib.  Tolerates well.  No new complaints Denies any constitutional symptoms .  Denies any abdominal pain.  Review of Systems  Constitutional: Negative for appetite change, chills, fatigue and fever.  HENT:   Negative for hearing loss and voice change.   Eyes: Negative for eye problems.  Respiratory: Negative for chest tightness and cough.   Cardiovascular: Negative for chest pain.  Gastrointestinal: Negative for abdominal distention, abdominal pain and blood in stool.  Endocrine: Negative for hot flashes.  Genitourinary: Negative for difficulty urinating and frequency.   Musculoskeletal: Negative for arthralgias.  Skin: Negative for itching and rash.  Neurological: Negative for extremity weakness.  Hematological: Negative for adenopathy.  Psychiatric/Behavioral: Negative for confusion.     .Marland KitchenMEDICAL HISTORY: Past Medical History:  Diagnosis Date  . Dysrhythmia    .  Genetic testing 03/28/2017   Multi-Cancer panel (83 genes) @ [facility] - No pathogenic mutations detected  . High grade ovarian cancer ([place]) 11/20/2016  . Pelvic mass in female     SURGICAL HISTORY: Past Surgical History:  Procedure Laterality Date  . APPENDECTOMY    . LAPAROSCOPY N/A 03/14/2017   Procedure: LAPAROSCOPY OPERATIVE;  Surgeon: Mellody Drown, MD;  Location: [facility];  Service: Gynecology;  Laterality: N/A;  . LAPAROTOMY N/A 03/14/2017   Procedure: LAPAROTOMY;  Surgeon: Mellody Drown, MD;  Location: [facility];  Service: Gynecology;  Laterality: N/A;  . LYMPH NODE DISSECTION N/A 03/14/2017   Procedure: LYMPH NODE DISSECTION;  Surgeon: Mellody Drown, MD;  Location: [facility];  Service: Gynecology;  Laterality: N/A;  . OMENTECTOMY N/A 03/14/2017   Procedure: OMENTECTOMY;  Surgeon: Mellody Drown, MD;  Location: [facility];  Service: Gynecology;  Laterality: N/A;  . PORTA CATH INSERTION N/A 11/27/2016   Procedure: Glori Luis Cath Insertion;  Surgeon: Algernon Huxley, MD;  Location: [facility];  Service: Cardiovascular;  Laterality: N/A;    SOCIAL HISTORY: Social History   Tobacco Use  . Smoking status: Never Smoker  . Smokeless tobacco: Never Used  Vaping Use  . Vaping Use: Never used  Substance Use Topics  . Alcohol use: Not Currently  . Drug use: No     FAMILY HISTORY Family History  Problem Relation Age of Onset  . Throat cancer Cousin   . Throat cancer Cousin   . Leukemia Cousin     ALLERGIES:  is allergic to omeprazole.  MEDICATIONS:  Current Outpatient Medications  Medication Sig Dispense Refill  . lidocaine-prilocaine (EMLA) cream Apply 1 application topically as needed. Apply small amount to port site at least 1 hour prior to it being accessed, cover with plastic wrap 30 g 1  . olaparib (LYNPARZA) 150 MG tablet Take 2 tablets (300 mg total) by mouth 2 (two) times daily. Swallow whole. May take with food to decrease nausea and vomiting. 120 tablet  1  . vitamin B-12 (CYANOCOBALAMIN) 1000 MCG tablet Take 1 tablet (1,000 mcg total) by mouth daily. 90 tablet 4   No current facility-administered medications for this visit.    PHYSICAL EXAMINATION:  ECOG PERFORMANCE STATUS: 0 - Asymptomatic Vitals:   06/10/20 1034  BP: 107/61  Pulse: 96  Temp: 98.5 F (36.9 C)  SpO2: 100%    Filed Weights   06/10/20 1034  Weight: 121 lb 11.2 oz (55.2 kg)     Physical Exam Constitutional:      General: She is not in acute distress.    Appearance: She is not diaphoretic.     Comments: Thin built, she walks independantly  HENT:     Head: Normocephalic and atraumatic.     Nose: Nose normal.     Mouth/Throat:     Pharynx: No oropharyngeal exudate.  Eyes:     General: No scleral icterus.    Pupils: Pupils are equal, round, and reactive to light.  Neck:     Vascular: No JVD.  Cardiovascular:     Rate and Rhythm: Normal rate and regular rhythm.     Heart sounds: No murmur heard.   Pulmonary:     Effort: Pulmonary effort is normal. No respiratory distress.     Breath sounds: Normal breath sounds. No rales.  Chest:     Chest wall: No tenderness.  Abdominal:     General: Bowel sounds are normal. There is no distension.     Palpations:  Abdomen is soft.     Tenderness: There is no abdominal tenderness.  Musculoskeletal:        General: Normal range of motion.     Cervical back: Normal range of motion and neck supple.  Lymphadenopathy:     Cervical: No cervical adenopathy.  Skin:    General: Skin is warm and dry.     Findings: No erythema.     Comments: Right anterior medi port +   Neurological:     Mental Status: She is alert and oriented to person, place, and time.     Cranial Nerves: No cranial nerve deficit.     Motor: No abnormal muscle tone.     Coordination: Coordination normal.  Psychiatric:        Mood and Affect: Affect normal.        Judgment: Judgment normal.      LABORATORY DATA: I have personally reviewed the  data as listed: CBC    Component Value Date/Time   WBC 3.3 (L) 06/08/2020 1507   RBC 2.78 (L) 06/08/2020 1507   HGB 11.3 (L) 06/08/2020 1507   HCT 32.3 (L) 06/08/2020 1507   PLT 150 06/08/2020 1507   MCV 116.2 (H) 06/08/2020 1507   MCH 40.6 (H) 06/08/2020 1507   MCHC 35.0 06/08/2020 1507   RDW 14.3 06/08/2020 1507   LYMPHSABS 0.9 06/08/2020 1507   MONOABS 0.3 06/08/2020 1507   EOSABS 0.1 06/08/2020 1507   BASOSABS 0.0 06/08/2020 1507   CMP Latest Ref Rng & Units 06/08/2020 04/27/2020 03/19/2020  Glucose 70 - 99 mg/dL 121(H) 101(H) 91  BUN 8 - 23 mg/dL '15 12 10  ' Creatinine 0.44 - 1.00 mg/dL 0.72 0.66 0.83  Sodium 135 - 145 mmol/L 134(L) 130(L) 132(L)  Potassium 3.5 - 5.1 mmol/L 3.9 3.9 4.2  Chloride 98 - 111 mmol/L 100 100 98  CO2 22 - 32 mmol/L '25 24 25  ' Calcium 8.9 - 10.3 mg/dL 9.8 9.5 9.9  Total Protein 6.5 - 8.1 g/dL 7.3 6.9 7.2  Total Bilirubin 0.3 - 1.2 mg/dL 0.7 0.6 0.8  Alkaline Phos 38 - 126 U/L 70 57 63  AST 15 - 41 U/L '16 16 17  ' ALT 0 - 44 U/L '9 8 10    ' Pathology 11/16/2016 Surgical Pathology  CASE: [id number]  PATIENT: Onia Fredin  Surgical Pathology Report   SPECIMEN SUBMITTED:  A. Retroperitoneal adenopathy, left  DIAGNOSIS:  A. LYMPH NODE, LEFT RETROPERITONEAL; CT-GUIDED CORE BIOPSY:  - METASTATIC HIGH-GRADE SEROUS CARCINOMA.  Pathology 03/14/2017   DIAGNOSIS:  A. OMENTUM; OMENTECTOMY:  - NO TUMOR SEEN.  - ONE NEGATIVE LYMPH NODE (0/1).   B. RIGHT FALLOPIAN TUBE AND OVARY; SALPINGO-OOPHORECTOMY:  - SMALL FOCI OF HIGH GRADE SEROUS CARCINOMA INVOLVING THE OVARY.  - MARKED THERAPY RELATED CHANGE.  - NO TUMOR SEEN IN THE FALLOPIAN TUBE.   C. UTERUS, CERVIX, LEFT FALLOPIAN TUBE AND OVARY; HYSTERECTOMY AND LEFT  SALPINGO-OOPHORECTOMY:  - NABOTHIAN CYSTS.  - CYSTIC ATROPHY OF THE ENDOMETRIUM.  - SEROSAL ADHESIONS.  - UNREMARKABLE FALLOPIAN TUBE.  - OVARY SHOWING TREATMENT RELATED CHANGE.   D. 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    RADIOGRAPHIC STUDIES: I have personally reviewed the radiological images as listed and agreed with the findings in the report. CT CHEST ABDOMEN PELVIS W CONTRAST  Result Date: 03/22/2020 CLINICAL DATA:  Ovarian cancer.  Restaging. EXAM: CT CHEST, ABDOMEN, AND PELVIS WITH CONTRAST TECHNIQUE: Multidetector CT imaging of  the chest, abdomen and pelvis was performed following the standard protocol during bolus administration of intravenous contrast. CONTRAST:  16m OMNIPAQUE IOHEXOL 300 MG/ML  SOLN COMPARISON:  12/29/2019. FINDINGS: CT CHEST FINDINGS Cardiovascular: The heart size is normal. No substantial pericardial effusion. No thoracic aortic aneurysm. Right Port-A-Cath tip is positioned in the mid to distal SVC. Mediastinum/Nodes: No mediastinal lymphadenopathy. There is no hilar lymphadenopathy. The esophagus has normal imaging features. There is no axillary lymphadenopathy. Lungs/Pleura: Biapical pleuroparenchymal scarring again noted. 5 mm left lower lobe pulmonary nodule (99/3) is unchanged. No new suspicious nodule or mass. No focal airspace consolidation. No pleural effusion. Musculoskeletal: No worrisome lytic or sclerotic osseous abnormality. CT ABDOMEN PELVIS FINDINGS Hepatobiliary: No suspicious focal abnormality within the liver parenchyma. There is no evidence for gallstones, gallbladder wall thickening, or pericholecystic fluid. No intrahepatic or extrahepatic biliary dilation. Pancreas: No focal mass lesion. No dilatation of the main duct. No intraparenchymal cyst. No peripancreatic edema. Spleen: No splenomegaly. No focal mass lesion. Adrenals/Urinary Tract: No adrenal nodule or mass. Tiny hypodensities in each kidney are too small to characterize but stable and most likely benign. No evidence for hydroureter. Bladder is nondistended. Stomach/Bowel: Stomach is unremarkable. No gastric wall thickening. No evidence  of outlet obstruction. Duodenum is normally positioned as is the ligament of Treitz. No small bowel wall thickening. No small bowel dilatation. The terminal ileum is normal. The appendix is not visualized, but there is no edema or inflammation in the region of the cecum. No gross colonic mass. No colonic wall thickening. Prominent stool volume right and transverse segments of the colon. Vascular/Lymphatic: There is abdominal aortic atherosclerosis without aneurysm. No substantial change retroperitoneal lymphadenopathy. Aortocaval node on 72/2 is 1.9 cm short axis today, stable in the interval. Retrocaval node on 73/2 measures 1.6 cm today compared to 1.5 cm previously. No new lymphadenopathy in the abdomen. Lymphadenopathy does generates some mass-effect on the IVC. Portal vein, superior mesenteric vein, and splenic vein are patent. No pelvic sidewall lymphadenopathy. 3.3 x 1.3 cm focus of soft tissue is identified in the posterior pelvis, between small bowel loops and rectum. This measures minimally smaller today than on the prior exam. Reproductive: Uterus surgically absent.  There is no adnexal mass. Other: No intraperitoneal free fluid. Musculoskeletal: No worrisome lytic or sclerotic osseous abnormality. IMPRESSION: 1. Stable appearance of the 5 mm left lower lobe pulmonary nodule, retroperitoneal adenopathy, and posterior pelvic soft tissue. No new or progressive interval findings. 2. Prominent stool volume right and transverse segments of the colon. Imaging features could be compatible with clinical constipation. 3.  Aortic Atherosclerois (ICD10-170.0) Electronically Signed   By: EMisty StanleyM.D.   On: 03/22/2020 09:05    ASSESSMENT/PLAN Cancer Staging Malignant neoplasm of ovary ([facility] Staging form: Ovary, Fallopian Tube, and Primary Peritoneal Carcinoma, AJCC 8th Edition - Clinical stage from 11/22/2016: Stage IIIC (cT3c, cN1b, cM0) - Signed by YEarlie Server MD on 11/22/2016 Stage used in treatment  planning: Yes National guidelines used in treatment planning: Yes Type of national guideline used in treatment planning: NCCN  1. [place] cancer ([place]   2. Port-A-Cath in place   3. [place]rombocytopenia ([place]   4. Encounter for antineoplastic chemotherapy   5. Anemia associated with chemotherapy   : # Ovarian Cancer, local recurrence.  Currently on olaparib 300 mg twice daily. She tolerates well.  CA 125 18.7-->61.2-->111-->90.5-->69--> 54.8--> 29.1-->47-->71.2-->67.7->73.2-->82.8-->70.4-->66.4--> 58.4--> 64.9--> 56.9--> 42.2-->47.7-->54.4-->68.2 Continue olaparib 300 mg daily.  Labs reviewed and are discussed with patient. Obtain follow-up CT  chest abdomen pelvis with contrast.  . Chronic anemia, hemoglobin stable at 11.3.  Chemotherapy-induced.  Stable.  Monitor. 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    Thrombocytopenia, grade 1, platelet count at 150,000 today.  Port-A-Cath in place, continue port flush every  8 weeks.  RTF 6 weeks  Orders Placed This Encounter  Procedures  . CT CHEST ABDOMEN PELVIS W CONTRAST    Standing Status:   Future    Standing Expiration Date:   06/10/2021    Order Specific Question:   If indicated for the ordered procedure, I authorize the administration of contrast media per Radiology protocol    Answer:   Yes    Order Specific Question:   Preferred imaging location?    Answer:   [facility]    Order Specific Question:   Is Oral Contrast requested for this exam?    Answer:   Yes, Per Radiology protocol    Order Specific Question:   Reason for Exam (SYMPTOM  OR DIAGNOSIS REQUIRED)    Answer:   ovarian cancer  . CBC with Differential/Platelet    Standing Status:   Future    Standing Expiration Date:   06/10/2021  . Comprehensive metabolic panel    Standing Status:   Future    Standing Expiration Date:   06/10/2021  . CA 125    Standing Status:   Future    Standing Expiration Date:   06/10/2021    Earlie Server, MD, PhD Hematology  Oncology [facility] at [facility] Pager- [phone] 06/10/20

## 2020-06-17 ENCOUNTER — Inpatient Hospital Stay: Payer: Medicare Other

## 2020-06-17 ENCOUNTER — Other Ambulatory Visit: Payer: Self-pay

## 2020-06-17 DIAGNOSIS — Z79899 Other long term (current) drug therapy: Secondary | ICD-10-CM | POA: Diagnosis not present

## 2020-06-17 DIAGNOSIS — Z95828 Presence of other vascular implants and grafts: Secondary | ICD-10-CM

## 2020-06-17 DIAGNOSIS — D6481 Anemia due to antineoplastic chemotherapy: Secondary | ICD-10-CM | POA: Diagnosis not present

## 2020-06-17 DIAGNOSIS — C569 Malignant neoplasm of unspecified ovary: Secondary | ICD-10-CM | POA: Diagnosis not present

## 2020-06-17 DIAGNOSIS — Z808 Family history of malignant neoplasm of other organs or systems: Secondary | ICD-10-CM | POA: Diagnosis not present

## 2020-06-17 DIAGNOSIS — T451X5A Adverse effect of antineoplastic and immunosuppressive drugs, initial encounter: Secondary | ICD-10-CM | POA: Diagnosis not present

## 2020-06-17 DIAGNOSIS — I700 Atherosclerosis of aorta: Secondary | ICD-10-CM | POA: Diagnosis not present

## 2020-06-17 MED ORDER — SODIUM CHLORIDE 0.9% FLUSH
10.0000 mL | INTRAVENOUS | Status: DC | PRN
  Administered 2020-06-17: 10 mL via INTRAVENOUS
  Filled 2020-06-17: qty 10

## 2020-06-17 MED ORDER — HEPARIN SOD (PORK) LOCK FLUSH 100 UNIT/ML IV SOLN
500.0000 [IU] | Freq: Once | INTRAVENOUS | Status: AC
  Administered 2020-06-17: 500 [IU] via INTRAVENOUS
  Filled 2020-06-17: qty 5

## 2020-06-17 MED ORDER — HEPARIN SOD (PORK) LOCK FLUSH 100 UNIT/ML IV SOLN
INTRAVENOUS | Status: AC
  Filled 2020-06-17: qty 5

## 2020-06-30 ENCOUNTER — Other Ambulatory Visit: Payer: Self-pay

## 2020-06-30 ENCOUNTER — Ambulatory Visit
Admission: RE | Admit: 2020-06-30 | Discharge: 2020-06-30 | Disposition: A | Discharge status: Home / Self Care | Payer: Medicare Other | Source: Ambulatory Visit | Attending: Oncology | Admitting: Oncology

## 2020-06-30 DIAGNOSIS — Z90710 Acquired absence of both cervix and uterus: Secondary | ICD-10-CM | POA: Diagnosis not present

## 2020-06-30 DIAGNOSIS — C569 Malignant neoplasm of unspecified ovary: Secondary | ICD-10-CM | POA: Insufficient documentation

## 2020-06-30 DIAGNOSIS — J984 Other disorders of lung: Secondary | ICD-10-CM | POA: Diagnosis not present

## 2020-06-30 DIAGNOSIS — R918 Other nonspecific abnormal finding of lung field: Secondary | ICD-10-CM | POA: Diagnosis not present

## 2020-06-30 DIAGNOSIS — I700 Atherosclerosis of aorta: Secondary | ICD-10-CM | POA: Diagnosis not present

## 2020-06-30 DIAGNOSIS — R590 Localized enlarged lymph nodes: Secondary | ICD-10-CM | POA: Diagnosis not present

## 2020-06-30 MED ORDER — IOHEXOL 300 MG/ML  SOLN
85.0000 mL | Freq: Once | INTRAMUSCULAR | Status: AC | PRN
  Administered 2020-06-30: 85 mL via INTRAVENOUS

## 2020-07-16 ENCOUNTER — Other Ambulatory Visit: Payer: Self-pay | Admitting: Oncology

## 2020-07-16 DIAGNOSIS — C569 Malignant neoplasm of unspecified ovary: Secondary | ICD-10-CM

## 2020-07-20 ENCOUNTER — Inpatient Hospital Stay: Payer: Medicare Other | Attending: Oncology

## 2020-07-20 DIAGNOSIS — Z90710 Acquired absence of both cervix and uterus: Secondary | ICD-10-CM | POA: Insufficient documentation

## 2020-07-20 DIAGNOSIS — C569 Malignant neoplasm of unspecified ovary: Secondary | ICD-10-CM | POA: Diagnosis not present

## 2020-07-20 DIAGNOSIS — Z90722 Acquired absence of ovaries, bilateral: Secondary | ICD-10-CM | POA: Insufficient documentation

## 2020-07-20 DIAGNOSIS — Z79899 Other long term (current) drug therapy: Secondary | ICD-10-CM | POA: Diagnosis not present

## 2020-07-20 LAB — CBC WITH DIFFERENTIAL/PLATELET
Abs Immature Granulocytes: 0.02 10*3/uL (ref 0.00–0.07)
Basophils Absolute: 0 10*3/uL (ref 0.0–0.1)
Basophils Relative: 1 %
Eosinophils Absolute: 0.1 10*3/uL (ref 0.0–0.5)
Eosinophils Relative: 2 %
HCT: 32.4 % — ABNORMAL LOW (ref 36.0–46.0)
Hemoglobin: 11.4 g/dL — ABNORMAL LOW (ref 12.0–15.0)
Immature Granulocytes: 1 %
Lymphocytes Relative: 25 %
Lymphs Abs: 0.8 10*3/uL (ref 0.7–4.0)
MCH: 40.3 pg — ABNORMAL HIGH (ref 26.0–34.0)
MCHC: 35.2 g/dL (ref 30.0–36.0)
MCV: 114.5 fL — ABNORMAL HIGH (ref 80.0–100.0)
Monocytes Absolute: 0.3 10*3/uL (ref 0.1–1.0)
Monocytes Relative: 11 %
Neutro Abs: 2 10*3/uL (ref 1.7–7.7)
Neutrophils Relative %: 60 %
Platelets: 135 10*3/uL — ABNORMAL LOW (ref 150–400)
RBC: 2.83 MIL/uL — ABNORMAL LOW (ref 3.87–5.11)
RDW: 13.8 % (ref 11.5–15.5)
WBC: 3.2 10*3/uL — ABNORMAL LOW (ref 4.0–10.5)
nRBC: 0 % (ref 0.0–0.2)

## 2020-07-20 LAB — COMPREHENSIVE METABOLIC PANEL
ALT: 8 U/L (ref 0–44)
AST: 15 U/L (ref 15–41)
Albumin: 4.5 g/dL (ref 3.5–5.0)
Alkaline Phosphatase: 60 U/L (ref 38–126)
Anion gap: 7 (ref 5–15)
BUN: 10 mg/dL (ref 8–23)
CO2: 26 mmol/L (ref 22–32)
Calcium: 10.1 mg/dL (ref 8.9–10.3)
Chloride: 99 mmol/L (ref 98–111)
Creatinine, Ser: 0.74 mg/dL (ref 0.44–1.00)
GFR, Estimated: >60 mL/min (ref >60)
Glucose, Bld: 92 mg/dL (ref 70–99)
Potassium: 4.4 mmol/L (ref 3.5–5.1)
Sodium: 132 mmol/L — ABNORMAL LOW (ref 135–145)
Total Bilirubin: 0.6 mg/dL (ref 0.3–1.2)
Total Protein: 7.2 g/dL (ref 6.5–8.1)

## 2020-07-21 ENCOUNTER — Ambulatory Visit: Payer: Medicare Other

## 2020-07-21 LAB — CA 125: Cancer Antigen (CA) 125: 79.3 U/mL — ABNORMAL HIGH (ref 0.0–38.1)

## 2020-07-22 ENCOUNTER — Inpatient Hospital Stay (HOSPITAL_BASED_OUTPATIENT_CLINIC_OR_DEPARTMENT_OTHER): Payer: Medicare Other | Admitting: Oncology

## 2020-07-22 ENCOUNTER — Encounter: Payer: Self-pay | Admitting: Oncology

## 2020-07-22 VITALS — BP 99/64 | HR 72 | Temp 98.7°F | Resp 18 | Wt 120.3 lb

## 2020-07-22 DIAGNOSIS — T451X5A Adverse effect of antineoplastic and immunosuppressive drugs, initial encounter: Secondary | ICD-10-CM | POA: Diagnosis not present

## 2020-07-22 DIAGNOSIS — Z90710 Acquired absence of both cervix and uterus: Secondary | ICD-10-CM | POA: Diagnosis not present

## 2020-07-22 DIAGNOSIS — Z90722 Acquired absence of ovaries, bilateral: Secondary | ICD-10-CM | POA: Diagnosis not present

## 2020-07-22 DIAGNOSIS — D6481 Anemia due to antineoplastic chemotherapy: Secondary | ICD-10-CM | POA: Diagnosis not present

## 2020-07-22 DIAGNOSIS — Z79899 Other long term (current) drug therapy: Secondary | ICD-10-CM | POA: Diagnosis not present

## 2020-07-22 DIAGNOSIS — Z95828 Presence of other vascular implants and grafts: Secondary | ICD-10-CM

## 2020-07-22 DIAGNOSIS — C569 Malignant neoplasm of unspecified ovary: Secondary | ICD-10-CM | POA: Diagnosis not present

## 2020-07-22 DIAGNOSIS — D696 Thrombocytopenia, unspecified: Secondary | ICD-10-CM | POA: Diagnosis not present

## 2020-07-22 DIAGNOSIS — Z5111 Encounter for antineoplastic chemotherapy: Secondary | ICD-10-CM

## 2020-07-22 NOTE — Progress Notes (Signed)
[facility] Follow up visit  Patient Care Team: Patient, No Pcp Per (Inactive) as PCP - General (General Practice) Clent Jacks, [name], [name], [name], MD as Referring Physician (Obstetrics and Gynecology)  CHIEF COMPLAINTS/PURPOSE OF Visit Follow up for chemotherapy tolerability of  ovarian cancer.  HISTORY OF PRESENTING ILLNESS: Katie Hill 70 y.o. female presents for follow up of management of stage IIIC ovarian cancer. She underwent  Neoadjuvant chemotherapy of carbo and taxol x 4  # Patient had debulking surgery on 03/14/2017. She had a laparoscopy with conversion to laparotomy, total hysterectomy, with bilateral salpingo oophorectomy, right aortic lymph node dissection, omentectomy. Pathology showed small foci of residual disease in ovary.   Genetic testing negative for83 genes on [facility]'s Multi-Cancer panel (ALK, APC, ATM, AXIN2, BAP1, BARD1, BLM, BMPR1A, BRCA1, BRCA2, BRIP1, CASR, CDC73, CDH1, CDK4, CDKN1B, CDKN1C, CDKN2A, CEBPA, CHEK2, CTNNA1, DICER1, DIS3L2, EGFR, EPCAM, FH, FLCN, GATA2, GPC3, GREM1, HOXB13, HRAS, KIT, MAX, MEN1, MET, MITF, MLH1, MSH2, MSH3, MSH6, MUTYH, NBN, NF1, NF2, NTHL1, PALB2, PDGFRA, PHOX2B, PMS2, POLD1, POLE, POT1, PRKAR1A, PTCH1, PTEN, RAD50, RAD51C, RAD51D, RB1, RECQL4, RET, RUNX1, SDHA, SDHAF2, SDHB, SDHC, SDHD, SMAD4, SMARCA4, SMARCB1, SMARCE1, STK11, SUFU, TERC, TERT, TMEM127, TP53, TSC1, TSC2, VHL, WRN, WT1).  A Variant of UncertainSignificancewas detected: CASRc.106G>A (p.Gly36Arg). Myraid testing negative for somatic BRACA1/2, positive for HRD.   # HRD positive, was referred to [facility] for clinical trials of Olarparib maintenance. She opted out.  #  Treatment:  Stage IIIC Ovarian cancer:  #s/p Carboplatin and taxol x 4 neoadjuvant chemotherapy followed by debulking surgery  03/14/2017 # 03/28/2017 S/p Adjuvant carbo and taxol x3   Local recurrence, CA125 one 46.3 03/15/2018-05/17/2018 Carboplatin and Taxol x 4. CA125 decrease from 49.7 to 6 after 4 cycles of treatment.  Started on Olarparib $RemoveBefo'300mg'yQlRqbkrRIc$  BID on 05/17/2018.  # was seen by Dr. Fransisca Connors for further evaluation due to rising Ca1 25 and MRI abdomenPelvis on 11/05/2018 showed Interval progression of, now measuring 2.7 x 2.3 cm and concerning for progression of metastatic disease.retrocaval lymph node in the abdomen Olaparib was held for short period of time. Her CA125 trended down again.  Dr. Fransisca Connors recommending resume olaparib and continue monitor. If she has more significant progression of disease she may be a candidate for clinical trial or will be treated with other chemotherapy drugs including Doxil, gemcitabine and Avastin.   INTERVAL HISTORY 70 y.o. female with above oncology history reviewed by me today presents for evaluation for chemotherapy for treatment of ovarian cancer  Patient was accompanied by her husband. Patient takes olaparib.  Patient tolerates well.  She reports having good daily bowel movements.  No abdominal pain, denies any constitutional symptoms.  Appetite is good .  Weight is stable . Review of Systems  Constitutional: Negative for appetite change, chills, fatigue and fever.  HENT:   Negative for hearing loss and voice change.   Eyes: Negative for eye problems.  Respiratory: Negative for chest tightness and cough.   Cardiovascular: Negative for chest pain.  Gastrointestinal: Negative for abdominal distention, abdominal pain and blood in stool.  Endocrine: Negative for hot flashes.  Genitourinary: Negative for difficulty urinating and frequency.   Musculoskeletal: Negative for arthralgias.  Skin: Negative for itching and rash.  Neurological: Negative for extremity weakness.  Hematological: Negative for adenopathy.  Psychiatric/Behavioral: Negative for confusion.     [name] MEDICAL  HISTORY: Past Medical History:  Diagnosis Date  . Dysrhythmia   . Genetic testing 03/28/2017   Multi-Cancer panel (83 genes) @ [facility] - No pathogenic mutations detected  . High grade ovarian cancer ([place]) 11/20/2016  . Pelvic mass in female     SURGICAL HISTORY: Past Surgical History:  Procedure Laterality Date  . APPENDECTOMY    . LAPAROSCOPY N/A 03/14/2017   Procedure: LAPAROSCOPY OPERATIVE;  Surgeon: Mellody Drown, MD;  Location: [facility];  Service: Gynecology;  Laterality: N/A;  . LAPAROTOMY N/A 03/14/2017   Procedure: LAPAROTOMY;  Surgeon: Mellody Drown, MD;  Location: [facility];  Service: Gynecology;  Laterality: N/A;  . LYMPH NODE DISSECTION N/A 03/14/2017   Procedure: LYMPH NODE DISSECTION;  Surgeon: Mellody Drown, MD;  Location: [facility];  Service: Gynecology;  Laterality: N/A;  . OMENTECTOMY N/A 03/14/2017   Procedure: OMENTECTOMY;  Surgeon: Mellody Drown, MD;  Location: [facility];  Service: Gynecology;  Laterality: N/A;  . PORTA CATH INSERTION N/A 11/27/2016   Procedure: Glori Luis Cath Insertion;  Surgeon: Algernon Huxley, MD;  Location: [facility];  Service: Cardiovascular;  Laterality: N/A;    SOCIAL HISTORY: Social History   Tobacco Use  . Smoking status: Never Smoker  . Smokeless tobacco: Never Used  Vaping Use  . Vaping Use: Never used  Substance Use Topics  . Alcohol use: Not Currently  . Drug use: No     FAMILY HISTORY Family History  Problem Relation Age of Onset  . Throat cancer Cousin   . Throat cancer Cousin   . Leukemia Cousin     ALLERGIES:  is allergic to omeprazole.  MEDICATIONS:  Current Outpatient Medications  Medication Sig Dispense Refill  . lidocaine-prilocaine (EMLA) cream Apply 1 application topically as needed. Apply small amount to port site at least 1 hour prior to it being accessed, cover with plastic wrap 30 g 1  . LYNPARZA 150 MG tablet TAKE TWO TABLETS BY MOUTH TWICE A DAY (SWALLOW WHOLE. MAY TAKE WITH FOOD TO  DECREASE NAUSEA AND VOMITING) 120 tablet 1  . vitamin B-12 (CYANOCOBALAMIN) 1000 MCG tablet Take 1 tablet (1,000 mcg total) by mouth daily. 90 tablet 4   No current facility-administered medications for this visit.    PHYSICAL EXAMINATION:  ECOG PERFORMANCE STATUS: 0 - Asymptomatic Vitals:   07/22/20 1007  BP: 99/64  Pulse: 72  Resp: 18  Temp: 98.7 F (37.1 C)  SpO2: 100%    Filed Weights   07/22/20 1007  Weight: 120 lb 4.8 oz (54.6 kg)     Physical Exam Constitutional:      General: She is not in acute distress.    Appearance: She is not diaphoretic.     Comments: Thin built, she walks independantly  HENT:     Head: Normocephalic and atraumatic.     Nose: Nose normal.     Mouth/Throat:     Pharynx: No oropharyngeal exudate.  Eyes:     General: No scleral icterus.    Pupils: Pupils are equal, round, and reactive to light.  Neck:     Vascular: No JVD.  Cardiovascular:     Rate and Rhythm: Normal rate and regular rhythm.     Heart sounds: No murmur heard.   Pulmonary:     Effort: Pulmonary effort is normal. No respiratory distress.     Breath sounds: Normal breath sounds. No rales.  Chest:     Chest wall: No tenderness.  Abdominal:     General: Bowel sounds  are normal. There is no distension.     Palpations: Abdomen is soft.     Tenderness: There is no abdominal tenderness.  Musculoskeletal:        General: Normal range of motion.     Cervical back: Normal range of motion and neck supple.  Lymphadenopathy:     Cervical: No cervical adenopathy.  Skin:    General: Skin is warm and dry.     Findings: No erythema.     Comments: Right anterior medi port +   Neurological:     Mental Status: She is alert and oriented to person, place, and time.     Cranial Nerves: No cranial nerve deficit.     Motor: No abnormal muscle tone.     Coordination: Coordination normal.  Psychiatric:        Mood and Affect: Affect normal.        Judgment: Judgment normal.       LABORATORY DATA: I have personally reviewed the data as listed: CBC    Component Value Date/Time   WBC 3.2 (L) 07/20/2020 1039   RBC 2.83 (L) 07/20/2020 1039   HGB 11.4 (L) 07/20/2020 1039   HCT 32.4 (L) 07/20/2020 1039   PLT 135 (L) 07/20/2020 1039   MCV 114.5 (H) 07/20/2020 1039   MCH 40.3 (H) 07/20/2020 1039   MCHC 35.2 07/20/2020 1039   RDW 13.8 07/20/2020 1039   LYMPHSABS 0.8 07/20/2020 1039   MONOABS 0.3 07/20/2020 1039   EOSABS 0.1 07/20/2020 1039   BASOSABS 0.0 07/20/2020 1039   CMP Latest Ref Rng & Units 07/20/2020 06/08/2020 04/27/2020  Glucose 70 - 99 mg/dL 92 121(H) 101(H)  BUN 8 - 23 mg/dL _0 Creatinine 0.44 - 1.00 mg/dL 0.74 0.72 0.66  Sodium 135 - 145 mmol/L 132(L) 134(L) 130(L)  Potassium 3.5 - 5.1 mmol/L 4.4 3.9 3.9  Chloride 98 - 111 mmol/L 99 100 100  CO2 22 - 32 mmol/L _1 Calcium 8.9 - 10.3 mg/dL 10.1 9.8 9.5  Total Protein 6.5 - 8.1 g/dL 7.2 7.3 6.9  Total Bilirubin 0.3 - 1.2 mg/dL 0.6 0.7 0.6  Alkaline Phos 38 - 126 U/L 60 70 57  AST 15 - 41 U/L _2 ALT 0 - 44 U/L _3 Pathology 11/16/2016 Surgical Pathology  CASE: [id number]  PATIENT: Marien Younker  Surgical Pathology Report   SPECIMEN SUBMITTED:  A. Retroperitoneal adenopathy, left  DIAGNOSIS:  A. LYMPH NODE, LEFT RETROPERITONEAL; CT-GUIDED CORE BIOPSY:  - METASTATIC HIGH-GRADE SEROUS CARCINOMA.  Pathology 03/14/2017   DIAGNOSIS:  A. OMENTUM; OMENTECTOMY:  - NO TUMOR SEEN.  - ONE NEGATIVE LYMPH NODE (0/1).   B. RIGHT FALLOPIAN TUBE AND OVARY; SALPINGO-OOPHORECTOMY:  - SMALL FOCI OF HIGH GRADE SEROUS CARCINOMA INVOLVING THE OVARY.  - MARKED THERAPY RELATED CHANGE.  - NO TUMOR SEEN IN THE FALLOPIAN TUBE.   C. UTERUS, CERVIX, LEFT FALLOPIAN TUBE AND OVARY; HYSTERECTOMY AND LEFT  SALPINGO-OOPHORECTOMY:  - NABOTHIAN CYSTS.  - CYSTIC ATROPHY OF THE ENDOMETRIUM.  - SEROSAL ADHESIONS.  - UNREMARKABLE FALLOPIAN TUBE.  - OVARY SHOWING TREATMENT RELATED CHANGE.    D. 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    RADIOGRAPHIC STUDIES: I have personally reviewed the radiological images as listed and agreed with the findings in the report. CT CHEST ABDOMEN PELVIS W CONTRAST  Result Date: 06/30/2020 CLINICAL DATA:  Ovarian cancer, restaging. EXAM: CT  CHEST, ABDOMEN, AND PELVIS WITH CONTRAST TECHNIQUE: Multidetector CT imaging of the chest, abdomen and pelvis was performed following the standard protocol during bolus administration of intravenous contrast. CONTRAST:  87mL OMNIPAQUE IOHEXOL 300 MG/ML  SOLN COMPARISON:  Multiple priors including CT chest abdomen pelvis March 22, 2020 FINDINGS: CT CHEST FINDINGS Cardiovascular: Right chest wall porta catheter with tip in the superior cavoatrial junction. No thoracic aortic aneurysm. Scattered aortic atherosclerosis. No central pulmonary embolus. Normal size heart. No significant pericardial effusion/thickening. Mediastinum/Nodes: No mediastinal, hilar or axillary lymphadenopathy. Normal trachea and esophagus. Or Lungs/Pleura: Similar biapical pleuroparenchymal scarring. Unchanged size of the 5 mm left lower lobe pulmonary nodule on image 102/3. Unchanged size of 3 right lateral subpleural pulmonary nodules which measure 3 mm or smaller on image 135/3. No new or enlarging suspicious pulmonary nodules or masses. No focal consolidation. No pleural effusion. No pneumothorax. Musculoskeletal: No aggressive lytic or blastic lesions of bone. CT ABDOMEN PELVIS FINDINGS Hepatobiliary: No suspicious hepatic lesions. Gallbladder is unremarkable. No biliary ductal dilation. Pancreas: Unremarkable. No pancreatic ductal dilatation or surrounding inflammatory changes. Spleen: Normal in size without focal abnormality. Adrenals/Urinary Tract: Adrenal glands are unremarkable. Tiny bilateral renal hypodensities are too small to accurately characterize but stable  favored benign. Kidneys are without renal calculi, solid lesion, or hydronephrosis. Bladder is unremarkable, for degree of distension. Stomach/Bowel: Enteric contrast traverses the transverse colon. Stomach is grossly unremarkable. Normal positioning of the duodenum/ligament of [place]. No suspicious small bowel wall thickening or dilation. Terminal ileum is unremarkable. The appendix is not confidently identified however there is no pericecal inflammation. Moderate volume stool in the right in transverse segments of the colon. Vascular/Lymphatic: Abdominal aorta atherosclerosis. No abdominal aortic aneurysm. No significant change in the retroperitoneal adenopathy. No new or enlarging lymph nodes in the abdomen or pelvis. Index lymph nodes are as follows: -aortocaval lymph node measures 1.9 cm on image 73/2, unchanged. -retrocaval lymph node measures 1.5 cm on image 75/2 previously 1.6 cm Reproductive: Status post hysterectomy. No adnexal masses. Other: Unchanged size of the soft tissue nodularity located in the posterior pelvis between the rectum and loops of small bowel on image 100/10 measuring 3.3 x 1.3 cm. No abdominopelvic ascites. Musculoskeletal: No aggressive lytic or blastic lesions of bone. IMPRESSION: 1. Stable size and appearance of the pulmonary nodules, retroperitoneal adenopathy, and posterior pelvic soft tissue. No interval findings new or progressive disease. 2. Again seen is prominent stool volume in the right and transverse segments of the colon, possibly compatible with clinical constipation. 3. Aortic atherosclerosis.  Aortic Atherosclerosis (ICD10-I70.0). Electronically Signed   By: Dahlia Bailiff MD   On: 06/30/2020 15:11    ASSESSMENT/PLAN Cancer Staging Malignant neoplasm of ovary [facility]) Staging form: Ovary, Fallopian Tube, and Primary Peritoneal Carcinoma, AJCC 8th Edition - Clinical stage from 11/22/2016: Stage IIIC (cT3c, cN1b, cM0) - Signed by Earlie Server, MD on 11/22/2016 Stage used  in treatment planning: Yes National guidelines used in treatment planning: Yes Type of national guideline used in treatment planning: NCCN  1. High grade ovarian cancer ([place])   2. Thrombocytopenia ([place])   3. Port-A-Cath in place   4. Encounter for antineoplastic chemotherapy   5. Anemia associated with chemotherapy   : # Ovarian Cancer, local recurrence.  Currently on olaparib 300 mg twice daily. She tolerates well.  CA 125 18.7-->61.2-->111-->90.5-->69--> 54.8--> 29.1-->47-->71.2-->67.7->73.2-->82.8-->70.4-->66.4--> 58.4--> 64.9--> 56.9--> 42.2-->47.7-->54.4-->68.2 Recommend patient to continue olaparib 300 mg daily.   Labs are reviewed and discussed with patient. 06/30/2020, CT chest abdomen pelvis was independently  reviewed by me and discussed with patient. Stable soft tissue mass in the pelvis 3.3 x 1.3 cm.  No change. . Chronic anemia, hemoglobin stable at 11.4.  Chemotherapy-induced.  Stable and continue to monitor.   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    Thrombocytopenia, grade 1, platelet count at 1 35,000 today.  Port-A-Cath in place, continue port flush every  8 weeks.  RTF 6 weeks  Orders Placed This Encounter  Procedures  . CBC with Differential/Platelet    Standing Status:   Future    Standing Expiration Date:   07/22/2021  . Comprehensive metabolic panel    Standing Status:   Future    Standing Expiration Date:   07/22/2021  . CA 125    Standing Status:   Future    Standing Expiration Date:   07/22/2021    Earlie Server, MD, PhD Hematology Oncology [facility] Pager- [phone] 07/22/20

## 2020-08-04 ENCOUNTER — Inpatient Hospital Stay (HOSPITAL_BASED_OUTPATIENT_CLINIC_OR_DEPARTMENT_OTHER): Payer: Medicare Other | Admitting: Obstetrics and Gynecology

## 2020-08-04 ENCOUNTER — Encounter: Payer: Self-pay | Admitting: Obstetrics and Gynecology

## 2020-08-04 VITALS — BP 109/60 | HR 66 | Temp 98.3°F | Resp 19 | Wt 121.1 lb

## 2020-08-04 DIAGNOSIS — Z90722 Acquired absence of ovaries, bilateral: Secondary | ICD-10-CM | POA: Diagnosis not present

## 2020-08-04 DIAGNOSIS — C569 Malignant neoplasm of unspecified ovary: Secondary | ICD-10-CM

## 2020-08-04 DIAGNOSIS — Z79899 Other long term (current) drug therapy: Secondary | ICD-10-CM | POA: Diagnosis not present

## 2020-08-04 DIAGNOSIS — Z90710 Acquired absence of both cervix and uterus: Secondary | ICD-10-CM | POA: Diagnosis not present

## 2020-08-04 NOTE — Progress Notes (Signed)
Gynecologic Oncology Interval Visit   Referring Provider: Dr. 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 G0P0 female with locally recurrent advanced ovarian cancer, currently on olaparib since 2/20 with Dr Tasia Catchings, who presents to clinic for follow up.   She was diagnosed with high grade serous ovarian cancer (HRD) on CT directed node biopsy with partial bowel obstruction and extensive bulky adenopathy on 11/2016 and received 4 cycles of carbo-taxol with dramatic decline in CA 125 and improvement in imaging. She underwent interval debulking surgery to no gross residual disease on 03/14/2017 followed by re-initiation of chemotherapy on 03/28/17 with normalization of CA-125. She had a platinum sensitive recurrence and received carbo-taxol recurred and received carbo-taxol 03/15/2018-04/26/2018 followed by olaparib since 2/20 for somatic HRD positive malignancy. She was felt to have radiologic progression. Case was discussed at [facility] and recommendation was to continue parp inhibitor as she continued to be asymptomatic. We had discussed other options for treatment should she have clinical progression.  CA125 continues to stay with in a range 47-82 without clear up trend. No significant symptoms. CA125 most recently 79 on 07/20/20.  CT 06/30/20 IMPRESSION: 1. Stable size and appearance of the pulmonary nodules, retroperitoneal adenopathy, and posterior pelvic soft tissue. No interval findings new or progressive disease.  2. Again seen is prominent stool volume in the right and transverse segments of the colon, possibly compatible with clinical constipation. 3. Aortic atherosclerosis.  Aortic Atherosclerosis (ICD10-I70.0).  Only complaint is some discomfort in pelvis with BMs. CT scan also suggests constipation.  Oncology History: Katie Hill is a pleasant. G0P0 female with advanced ovarian  cancer.  She presented to the ER 11/13/2016 with symptoms of right lower quadrant pain for 3-4 weeks, associated with nausea, bloating, no change in weight, urinary frequency, and change in bowel habits with more diffucult and stringy BM's.  CT CHEST, ABDOMEN, AND PELVIS WITH CONTRAST IMPRESSION: 1. Large heterogeneous central pelvic mass measures up to 12.6 cm. The lesion generates substantial mass-effect on the uterus, bladder, and pelvic sidewall anatomy. The mass appears to invade the mid sigmoid colon. Etiology of the central pelvic mass is not definitive by CT, but ovarian primary is suspected. The lesion becomes indistinguishable from the posterior uterus on some images and uterine origin is possible, but the uterus does not appear to be the epicenter of the mass and appears to be more displaced by it. MRI of the pelvis without and with contrast may prove helpful to better delineate the relationship of the uterus to the central pelvic mass although it may not be able to definitively localize the origin. 2. Bulky retroperitoneal and left pelvic sidewall lymphadenopathy. The retroperitoneal lymphadenopathy generates substantial mass-effect on the IVC and left renal vein. The external iliac veins along each pelvic sidewall are markedly attenuated by the mass/lymphadenopathy. Patency of these vessels cannot be definitely confirmed on this exam.  11/16/16 LYMPH NODE, LEFT RETROPERITONEAL; CT-GUIDED CORE BIOPSY:  - METASTATIC HIGH-GRADE SEROUS CARCINOMA.   Decision was made to do neoadjuvant carbo/taxol chemotherapy.  CA125 fell from 4,382 to 64.6 (10/29).  CT scan showed dramatic response.  CT scan 10/14 Vascular/Lymphatic: Normal appearance of the abdominal aorta. Interval decrease in size of previously identified bulky retroperitoneal adenopathy. Index aortocaval node measures 1.6 x 2.6 cm, image 32 of series 2. Previously 4.4 x 5.3 cm. Index left periaortic nodal mass measures 2.2 x 1.3 cm, image  31  of series 2. Previously 3.7 x 4.7 cm. At the level of the bifurcation there is a low-attenuation nodal mass which measure 3.1 x 2.6 cm, image 44 of series 2. Previously 4.3 x 4.8 cm. Left common iliac node measures 1.2 cm, image 53 of series 2. Previously 2.2 cm. Left external iliac node measures 1.6 x 1.1 cm, image 67 of series 2. Previously 4.8 x 2.6 cm. Reproductive: Large mass centered around the uterus measures 9.4 x 5.5 cm, image 67 of series 2. Previously this measured 12.6 x 9.2 cm  On 03/14/2017 she underwent L/S converted to XL, TAH, BSO, right PA node resection, omentectomy, and LOA to no residual disease.   Pathology 03/14/2017  DIAGNOSIS:  A. OMENTUM; OMENTECTOMY:  - NO TUMOR SEEN.  - ONE NEGATIVE LYMPH NODE (0/1).   B. RIGHT FALLOPIAN TUBE AND OVARY; SALPINGO-OOPHORECTOMY:  - SMALL FOCI OF HIGH GRADE SEROUS CARCINOMA INVOLVING THE OVARY.  - MARKED THERAPY RELATED CHANGE.  - NO TUMOR SEEN IN THE FALLOPIAN TUBE.   C. UTERUS, CERVIX, LEFT FALLOPIAN TUBE AND OVARY; HYSTERECTOMY AND LEFT  SALPINGO-OOPHORECTOMY:  - NABOTHIAN CYSTS.  - CYSTIC ATROPHY OF THE ENDOMETRIUM.  - SEROSAL ADHESIONS.  - UNREMARKABLE FALLOPIAN TUBE.  - OVARY SHOWING TREATMENT RELATED CHANGE.   D. 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   Then completed an additional 3 cycles of carbo/Taxol and CA 125 was 7.4 05/09/17.  CA 125  14 in 5/19  21 in 8/19.   CT scan 11/22/17 IMPRESSION: 1. Interval debulking surgery with decrease in retroperitoneal and extraperitoneal lymphadenopathy. In some areas the lymphadenopathy has resolved completely. Peritoneal implant seen along the falciform ligament on the prior study has decreased. 2. 4 x 2 cm collection of complex fluid or soft tissue in the right cul-de-sac is in the region of the complex mass lesion seen on the previous study. Given that there is some apparent enhancement  peripherally, residual/recurrent disease is a concern and close attention will be required. 3. Stable tiny bilateral pulmonary nodules with no pleural effusion.  CA125  25.7 12/24/17 46.3 02/18/18  02/28/2018- CT C/A/P  1. Compared to the prior examination there has been interval enlargement of an aortocaval lymph node which currently measures 1.4 cm in short axis (axial image 75 of series 2). This lymph node is in close proximity to the previously resected retroperitoneal lymphadenopathy and is very concerning for an additional nodal metastasis. 2. Other findings in the chest, abdomen and pelvis appear very similar to prior examinations, as discussed above. 3. Aortic atherosclerosis.  She received 3 cycles of carbo-Taxol 03/15/2018-04/26/2018. CA 125 improved to 6.0. She started olaparib 300 mg BID 2/20 for platinum-sensitive recurrent ovarian cancer (HRD positive).   CA125 04/05/2018    15.3 04/26/2018       6.3 05/17/2018         6.0 06/07/2018        6.9 06/21/2018 7.0 07/05/2018 8.4 08/02/2018 18.7 08/29/2018 61.2 (H) 09/23/2018  111.0 (H) 10/21/2018 90.5 (H)    11/04/18 MRI Abd/Pelvis IMPRESSION: 1. Interval progression of a retrocaval lymph node in the abdomen, now measuring 2.7 x 2.3 cm and concerning for progression of metastatic disease. 2. Aortocaval lymph node seen as decreased on the prior study is stable. This is unenlarged by MR criteria. 3. 3.3 x 1.6 cm homogeneous lesion posterior right pelvis is smaller than 02/28/2018. This is homogeneous and shows thin, smooth peripheral enhancement with no evidence for central enhancement  and likely represents a chronic postoperative collection although metastatic disease not entirely excluded.  She continued on olaparib (she only stopped for about a 1/2 a day) and her CA125 values declined. She saw Dr. Tasia Catchings on 02/12/2019. She had chronic anemia and grade 1 thrombocytopenia, secondary to olaparib. Otherwise tolerated treatment very well.    05/08/2019 CT C/A/P IMPRESSION: 1. Mild progression of metastatic disease. Mildly enlarged aortocaval lymph node is newly enlarged since 11/05/2018 MRI and 08/28/2018 CT studies. Separate enlarged retrocaval lymph node is stable since 11/05/2018 MRI and increased since 08/28/2018 CT. 2. Stable posterior right pelvic soft tissue focus, characterized as probable chronic postsurgical collection on prior MRI. 3.  Aortic Atherosclerosis (ICD10-I70.0).  Reviewed imaging 08/2018; 10/2018; 04/2019; Measurement vary on imaging reads. She does not have a baseline CT scan from 05/2018 1.Enlarged retrocaval lymph node measures 2.2 cm short axis diameter (series 2/image 29), compared to 2.2 cm 11/05/2018 MRI using similar measuring technique and 1.5 cm on 08/28/2018 CT. 2. Posterior pelvic 3.2 x 1.9 cm soft tissue focus just to the right of midline (series 2/image 64), previously 3.5 x 1.6 cm on 08/28/2018 CT. 3.3 x 1.6 cm  On MRI 10/2018. 3. New enlarged PA node 1.5 cm (series 2/image 29)  CT 3/21  IMPRESSION: 1. Previously noted retrocaval lymph node is stable, while the adjacent aortocaval lymph node has slightly increased in size compared to the prior examination. Previously noted soft tissue attenuation lesion in the posterior aspect of the anatomic pelvis appear stable compared to the prior study. No other new sites of metastatic disease are noted elsewhere in the chest, abdomen or pelvis. 2. Previously noted small pulmonary nodules are all stable in number and size compared to the prior examinations, favored to be benign. 3. Aortic atherosclerosis.   Genetic testing negative for 83 genes on [facility]'s Multi-Cancer panel (ALK, APC, ATM, AXIN2, BAP1, BARD1, BLM, BMPR1A, BRCA1, BRCA2, BRIP1, CASR, CDC73, CDH1, CDK4, CDKN1B, CDKN1C, CDKN2A, CEBPA, CHEK2, CTNNA1, DICER1, DIS3L2, EGFR, EPCAM, FH, FLCN, GATA2, GPC3, GREM1, HOXB13, HRAS, KIT, MAX, MEN1, MET, MITF, MLH1, MSH2, MSH3, MSH6, MUTYH, NBN, NF1, NF2,  NTHL1, PALB2, PDGFRA, PHOX2B, PMS2, POLD1, POLE, POT1, PRKAR1A, PTCH1, PTEN, RAD50, RAD51C, RAD51D, RB1, RECQL4, RET, RUNX1, SDHA, SDHAF2, SDHB, SDHC, SDHD, SMAD4, SMARCA4, SMARCB1, SMARCE1, STK11, SUFU, TERC, TERT, TMEM127, TP53, TSC1, TSC2, VHL, WRN, WT1).  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 This is still considered a normal result.   Somatic tumor testing:  She has HRD positive cancer, negative for somatic or germline BRCA mutation.   Patient Active Problem List   Diagnosis Date Noted  . High grade ovarian cancer ([place]) 12/04/2019  . Encounter for antineoplastic chemotherapy 04/14/2019  . Goals of care, counseling/discussion 03/09/2018  . Genetic testing 03/28/2017  . S/P total abdominal hysterectomy and bilateral salpingo-oophorectomy 03/14/2017  . Hyponatremia 12/12/2016  . Dehydration 12/08/2016  . Malignant neoplasm of ovary ([place]) 11/20/2016  . Pelvic mass 11/15/2016   Past Medical History:  Diagnosis Date  . Dysrhythmia   . Genetic testing 03/28/2017   Multi-Cancer panel (83 genes) @ [facility] - No pathogenic mutations detected  . High grade ovarian cancer ([place]) 11/20/2016  . Pelvic mass in female    Past Surgical History:  Procedure Laterality Date  . APPENDECTOMY    . LAPAROSCOPY N/A 03/14/2017   Procedure: LAPAROSCOPY OPERATIVE;  Surgeon: Mellody Drown, MD;  Location: [facility];  Service: Gynecology;  Laterality: N/A;  . LAPAROTOMY N/A 03/14/2017   Procedure: LAPAROTOMY;  Surgeon: Mellody Drown, MD;  Location: [facility];  Service: Gynecology;  Laterality: N/A;  . LYMPH NODE DISSECTION N/A 03/14/2017   Procedure: LYMPH NODE DISSECTION;  Surgeon: Mellody Drown, MD;  Location: [facility];  Service: Gynecology;  Laterality: N/A;  . OMENTECTOMY N/A 03/14/2017   Procedure: OMENTECTOMY;  Surgeon: Mellody Drown, MD;  Location: [facility];  Service: Gynecology;  Laterality: N/A;  . PORTA CATH INSERTION N/A 11/27/2016   Procedure: Glori Luis  Cath Insertion;  Surgeon: Algernon Huxley, MD;  Location: [facility];  Service: Cardiovascular;  Laterality: N/A;   Past Gynecologic History:  Menarche: 17 Last Menstrual Period: 24 years ago History of Abnormal pap: no Last pap: years ago She does not have regular medical cancer and has not has screening mammogram, colonoscopy, or Pap smears. She has not had an abnormal Pap.   OB History    Gravida  0   Para  0   Term  0   Preterm  0   AB  0   Living  0     SAB  0   IAB  0   Ectopic  0   Multiple  0   Live Births  0          Family History  Problem Relation Age of Onset  . Throat cancer Cousin   . Throat cancer Cousin   . Leukemia Cousin    Social History   Socioeconomic History  . Marital status: Married    Spouse name: Not on file  . Number of children: Not on file  . Years of education: Not on file  . Highest education level: Not on file  Occupational History  . Not on file  Tobacco Use  . Smoking status: Never Smoker  . Smokeless tobacco: Never Used  Vaping Use  . Vaping Use: Never used  Substance and Sexual Activity  . Alcohol use: Not Currently  . Drug use: No  . Sexual activity: Yes    Birth control/protection: Post-menopausal  Other Topics Concern  . Not on file  Social History Narrative  . Not on file   Social Determinants of Health   Financial Resource Strain: Not on file  Food Insecurity: Not on file  Transportation Needs: Not on file  Physical Activity: Not on file  Stress: Not on file  Social Connections: Not on file   Allergies  Allergen Reactions  . Omeprazole Rash   Current Outpatient Medications on File Prior to Visit  Medication Sig Dispense Refill  . lidocaine-prilocaine (EMLA) cream Apply 1 application topically as needed. Apply small amount to port site at least 1 hour prior to it being accessed, cover with plastic wrap 30 g 1  . LYNPARZA 150 MG tablet TAKE TWO TABLETS BY MOUTH TWICE A DAY (SWALLOW WHOLE.  MAY TAKE WITH FOOD TO DECREASE NAUSEA AND VOMITING) 120 tablet 1  . vitamin B-12 (CYANOCOBALAMIN) 1000 MCG tablet Take 1 tablet (1,000 mcg total) by mouth daily. 90 tablet 4   No current facility-administered medications on file prior to visit.   Review of Systems General:  no complaints Skin: no complaints Eyes: no complaints HEENT: no complaints Breasts: no complaints Pulmonary: no complaints Cardiac: no complaints Gastrointestinal: no complaints Genitourinary/Sexual: no complaints Ob/Gyn: no complaints Musculoskeletal: no complaints Hematology: no complaints Neurologic/Psych: no complaints   Objective:  Physical Examination:  Today's Vitals   08/04/20 1024 08/04/20 1027  BP: 109/60   Pulse: 66   Resp: 19   Temp: 98.3  F (36.8 C)   SpO2: 100%   Weight: 121 lb 1.6 oz (54.9 kg)   PainSc:  0-No pain   Body mass index is 18.15 kg/m.  ECOG Performance Status: 0 - no symptoms  GENERAL: Patient is a well appearing female in no acute distress HEENT:  Sclera clear. Anicteric NODES:  Negative axillary, supraclavicular, inguinal lymph node survery LUNGS:  Clear to auscultation bilaterally.   HEART:  Regular rate and rhythm.  ABDOMEN:  Soft, nontender.  No hernias, incisions well healed. No masses or ascites EXTREMITIES:  No peripheral edema. Atraumatic. No cyanosis SKIN:  Clear with no obvious rashes or skin changes.  NEURO:  Nonfocal. Well oriented.  Appropriate affect.  Pelvic: EGBUS: no lesions Cervix: surgically absent Vagina: no lesions, no discharge or bleeding Uterus: surgically absent Adnexa: no palpable masses Rectovaginal: performed and confirmatory. No pelvic masses palpated.   Lab Results  Component Value Date   WBC 3.2 (L) 07/20/2020   HGB 11.4 (L) 07/20/2020   HCT 32.4 (L) 07/20/2020   MCV 114.5 (H) 07/20/2020   PLT 135 (L) 07/20/2020    Assessment:  Katie Hill is a 70 y.o. female diagnosed with high grade serous ovarian cancer (HRD)  on CT directed node biopsy with partial bowel obstruction and extensive bulky adenopathy 8/18. s/p 4 cycles of carbo/taxol chemotherapy with dramatic decline in CA125 and improvement on CT scan. Interval debulking surgery to no gross residual on 03/14/2017.  Reinitiation of chemotherapy on 03/28/2017 with normalization of CA125.  Platinum-sensitive recurrence with normalization with re-induction of carbo-Taxol 03/15/2018-04/26/2018. On olaparib since 2/20 for HRD positive cancer. CA125 elevated in the 40-80 range, but no clear progressive rise on Olaparib with mild pancytopenia. CT scan 3/22 without evidence of progression.  Decision made to continue on with Olaparib under Dr Tasia Catchings until clear sign of progression.  NED today.  Some constipation clinically and on CT scan.   She does not have a germline mutation in BRCA1/2 or other related gene, but somatic tumor testing is positive for HRD.    Medical co-morbidities complicating care: prior abdominal surgery.  Plan:   Problem List Items Addressed This Visit      Endocrine   Malignant neoplasm of ovary ([place]) - Primary     She will continue to see Dr. Tasia Catchings for evaluation on Olaparib. She is tolerating therapy well at full doses. Discussed that she has passed the two year mark on Olaparib and could consider discontinuing, but she prefers to continue since she is tolerating well.   Return to [place] clinic in 4 months.   If she has significant progression of disease she would be treated with other FDA approved drugs including Doxil, gemcitabine and Avastin.  Also could be a candidate for clinical trial.  A total of over 25 minutes were spent with the patient/family today; >50% was spent in education, counseling and coordination of care for platinum-sensitive  high grade serous ovarian cancer (HRD) cancer.  Mellody Drown, MD  CC:  Gae Dry,  MD [address] [place], [place] [address]  Dr. Tasia Catchings

## 2020-08-12 ENCOUNTER — Inpatient Hospital Stay: Payer: Medicare Other | Attending: Oncology

## 2020-08-12 DIAGNOSIS — C561 Malignant neoplasm of right ovary: Secondary | ICD-10-CM | POA: Diagnosis not present

## 2020-08-12 DIAGNOSIS — Z452 Encounter for adjustment and management of vascular access device: Secondary | ICD-10-CM | POA: Diagnosis not present

## 2020-08-12 DIAGNOSIS — Z95828 Presence of other vascular implants and grafts: Secondary | ICD-10-CM

## 2020-08-12 MED ORDER — HEPARIN SOD (PORK) LOCK FLUSH 100 UNIT/ML IV SOLN
500.0000 [IU] | Freq: Once | INTRAVENOUS | Status: AC
  Administered 2020-08-12: 500 [IU] via INTRAVENOUS
  Filled 2020-08-12: qty 5

## 2020-08-12 MED ORDER — SODIUM CHLORIDE 0.9% FLUSH
10.0000 mL | INTRAVENOUS | Status: DC | PRN
  Administered 2020-08-12: 10 mL via INTRAVENOUS
  Filled 2020-08-12: qty 10

## 2020-08-12 MED ORDER — HEPARIN SOD (PORK) LOCK FLUSH 100 UNIT/ML IV SOLN
INTRAVENOUS | Status: AC
  Filled 2020-08-12: qty 5

## 2020-09-03 ENCOUNTER — Inpatient Hospital Stay: Payer: Medicare Other

## 2020-09-03 ENCOUNTER — Inpatient Hospital Stay: Payer: Medicare Other | Admitting: Oncology

## 2020-09-08 ENCOUNTER — Inpatient Hospital Stay: Payer: Medicare Other | Attending: Oncology

## 2020-09-08 ENCOUNTER — Inpatient Hospital Stay (HOSPITAL_BASED_OUTPATIENT_CLINIC_OR_DEPARTMENT_OTHER): Payer: Medicare Other | Admitting: Oncology

## 2020-09-08 ENCOUNTER — Encounter: Payer: Self-pay | Admitting: Oncology

## 2020-09-08 VITALS — BP 111/67 | HR 66 | Temp 98.3°F | Resp 18 | Wt 120.6 lb

## 2020-09-08 DIAGNOSIS — T451X5A Adverse effect of antineoplastic and immunosuppressive drugs, initial encounter: Secondary | ICD-10-CM | POA: Diagnosis not present

## 2020-09-08 DIAGNOSIS — D6481 Anemia due to antineoplastic chemotherapy: Secondary | ICD-10-CM | POA: Insufficient documentation

## 2020-09-08 DIAGNOSIS — C569 Malignant neoplasm of unspecified ovary: Secondary | ICD-10-CM

## 2020-09-08 DIAGNOSIS — D7589 Other specified diseases of blood and blood-forming organs: Secondary | ICD-10-CM | POA: Diagnosis not present

## 2020-09-08 DIAGNOSIS — R911 Solitary pulmonary nodule: Secondary | ICD-10-CM

## 2020-09-08 DIAGNOSIS — D696 Thrombocytopenia, unspecified: Secondary | ICD-10-CM | POA: Diagnosis not present

## 2020-09-08 DIAGNOSIS — Z95828 Presence of other vascular implants and grafts: Secondary | ICD-10-CM

## 2020-09-08 DIAGNOSIS — Z5111 Encounter for antineoplastic chemotherapy: Secondary | ICD-10-CM

## 2020-09-08 LAB — CBC WITH DIFFERENTIAL/PLATELET
Abs Immature Granulocytes: 0.01 10*3/uL (ref 0.00–0.07)
Basophils Absolute: 0 10*3/uL (ref 0.0–0.1)
Basophils Relative: 1 %
Eosinophils Absolute: 0.1 10*3/uL (ref 0.0–0.5)
Eosinophils Relative: 3 %
HCT: 30.7 % — ABNORMAL LOW (ref 36.0–46.0)
Hemoglobin: 11 g/dL — ABNORMAL LOW (ref 12.0–15.0)
Immature Granulocytes: 0 %
Lymphocytes Relative: 26 %
Lymphs Abs: 0.8 10*3/uL (ref 0.7–4.0)
MCH: 41 pg — ABNORMAL HIGH (ref 26.0–34.0)
MCHC: 35.8 g/dL (ref 30.0–36.0)
MCV: 114.6 fL — ABNORMAL HIGH (ref 80.0–100.0)
Monocytes Absolute: 0.3 10*3/uL (ref 0.1–1.0)
Monocytes Relative: 9 %
Neutro Abs: 1.9 10*3/uL (ref 1.7–7.7)
Neutrophils Relative %: 61 %
Platelets: 142 10*3/uL — ABNORMAL LOW (ref 150–400)
RBC: 2.68 MIL/uL — ABNORMAL LOW (ref 3.87–5.11)
RDW: 14.9 % (ref 11.5–15.5)
WBC: 3.1 10*3/uL — ABNORMAL LOW (ref 4.0–10.5)
nRBC: 0 % (ref 0.0–0.2)

## 2020-09-08 LAB — COMPREHENSIVE METABOLIC PANEL
ALT: 9 U/L (ref 0–44)
AST: 15 U/L (ref 15–41)
Albumin: 4.5 g/dL (ref 3.5–5.0)
Alkaline Phosphatase: 66 U/L (ref 38–126)
Anion gap: 8 (ref 5–15)
BUN: 12 mg/dL (ref 8–23)
CO2: 24 mmol/L (ref 22–32)
Calcium: 9.9 mg/dL (ref 8.9–10.3)
Chloride: 100 mmol/L (ref 98–111)
Creatinine, Ser: 0.69 mg/dL (ref 0.44–1.00)
GFR, Estimated: >60 mL/min (ref >60)
Glucose, Bld: 98 mg/dL (ref 70–99)
Potassium: 4.2 mmol/L (ref 3.5–5.1)
Sodium: 132 mmol/L — ABNORMAL LOW (ref 135–145)
Total Bilirubin: 0.8 mg/dL (ref 0.3–1.2)
Total Protein: 7.2 g/dL (ref 6.5–8.1)

## 2020-09-08 MED ORDER — OLAPARIB 150 MG PO TABS: ORAL_TABLET | ORAL | 2 refills | Status: DC

## 2020-09-08 NOTE — Progress Notes (Signed)
Pt here for follow up. No new concerns voiced.   

## 2020-09-08 NOTE — Progress Notes (Signed)
[facility] Follow up visit  Patient Care Team: Patient, No Pcp Per (Inactive) as PCP - General (General Practice) Clent Jacks, [name], [name], [name], MD as Referring Physician (Obstetrics and Gynecology)  CHIEF COMPLAINTS/PURPOSE OF Visit Follow up for chemotherapy tolerability of  ovarian cancer.  HISTORY OF PRESENTING ILLNESS: Katie Hill 70 y.o. female presents for follow up of management of stage IIIC ovarian cancer. She underwent  Neoadjuvant chemotherapy of carbo and taxol x 4  # Patient had debulking surgery on 03/14/2017. She had a laparoscopy with conversion to laparotomy, total hysterectomy, with bilateral salpingo oophorectomy, right aortic lymph node dissection, omentectomy. Pathology showed small foci of residual disease in ovary.   Genetic testing negative for83 genes on [facility]'s Multi-Cancer panel (ALK, APC, ATM, AXIN2, BAP1, BARD1, BLM, BMPR1A, BRCA1, BRCA2, BRIP1, CASR, CDC73, CDH1, CDK4, CDKN1B, CDKN1C, CDKN2A, CEBPA, CHEK2, CTNNA1, DICER1, DIS3L2, EGFR, EPCAM, FH, FLCN, GATA2, GPC3, GREM1, HOXB13, HRAS, KIT, MAX, MEN1, MET, MITF, MLH1, MSH2, MSH3, MSH6, MUTYH, NBN, NF1, NF2, NTHL1, PALB2, PDGFRA, PHOX2B, PMS2, POLD1, POLE, POT1, PRKAR1A, PTCH1, PTEN, RAD50, RAD51C, RAD51D, RB1, RECQL4, RET, RUNX1, SDHA, SDHAF2, SDHB, SDHC, SDHD, SMAD4, SMARCA4, SMARCB1, SMARCE1, STK11, SUFU, TERC, TERT, TMEM127, TP53, TSC1, TSC2, VHL, WRN, WT1).  A Variant of UncertainSignificancewas detected: CASRc.106G>A (p.Gly36Arg). Myraid testing negative for somatic BRACA1/2, positive for HRD.   # HRD positive, was referred to [facility] for clinical trials of Olarparib maintenance. She opted out.  #  Treatment:  Stage IIIC Ovarian cancer:  #s/p Carboplatin and taxol x 4 neoadjuvant chemotherapy followed by debulking surgery  03/14/2017 # 03/28/2017 S/p Adjuvant carbo and taxol x3   Local recurrence, CA125 one 46.3 03/15/2018-05/17/2018 Carboplatin and Taxol x 4. CA125 decrease from 49.7 to 6 after 4 cycles of treatment.  Started on Olarparib 354m BID on 05/17/2018.  # was seen by Dr. BFransisca Connorsfor further evaluation due to rising Ca1 25 and MRI abdomenPelvis on 11/05/2018 showed Interval progression of, now measuring 2.7 x 2.3 cm and concerning for progression of metastatic disease.retrocaval lymph node in the abdomen Olaparib was held for short period of time. Her CA125 trended down again.  Dr. BFransisca Connorsrecommending resume olaparib and continue monitor. If she has more significant progression of disease she may be a candidate for clinical trial or will be treated with other chemotherapy drugs including Doxil, gemcitabine and Avastin.   INTERVAL HISTORY 70y.o. female with above oncology history reviewed by me today presents for evaluation for chemotherapy for treatment of ovarian cancer  Patient was accompanied by her husband. Patient takes olaparib.  Overall she tolerates well.  She has no new complaints . Review of Systems  Constitutional: Negative for appetite change, chills, fatigue and fever.  HENT:   Negative for hearing loss and voice change.   Eyes: Negative for eye problems.  Respiratory: Negative for chest tightness and cough.   Cardiovascular: Negative for chest pain.  Gastrointestinal: Negative for abdominal distention, abdominal pain and blood in stool.  Endocrine: Negative for hot flashes.  Genitourinary: Negative for difficulty urinating and frequency.   Musculoskeletal: Negative for arthralgias.  Skin: Negative for itching and rash.  Neurological: Negative for extremity weakness.  Hematological: Negative for adenopathy.  Psychiatric/Behavioral: Negative for confusion.     .Marland KitchenMEDICAL HISTORY: Past Medical History:  Diagnosis Date  . Dysrhythmia   . Genetic testing 03/28/2017  Multi-Cancer panel (83 genes) @ [facility] - No pathogenic mutations detected  . High grade ovarian cancer ([place]) 11/20/2016  . Pelvic mass in female     SURGICAL HISTORY: Past Surgical History:  Procedure Laterality Date  . APPENDECTOMY    . LAPAROSCOPY N/A 03/14/2017   Procedure: LAPAROSCOPY OPERATIVE;  Surgeon: Mellody Drown, MD;  Location: [facility];  Service: Gynecology;  Laterality: N/A;  . LAPAROTOMY N/A 03/14/2017   Procedure: LAPAROTOMY;  Surgeon: Mellody Drown, MD;  Location: [facility];  Service: Gynecology;  Laterality: N/A;  . LYMPH NODE DISSECTION N/A 03/14/2017   Procedure: LYMPH NODE DISSECTION;  Surgeon: Mellody Drown, MD;  Location: [facility];  Service: Gynecology;  Laterality: N/A;  . OMENTECTOMY N/A 03/14/2017   Procedure: OMENTECTOMY;  Surgeon: Mellody Drown, MD;  Location: [facility];  Service: Gynecology;  Laterality: N/A;  . PORTA CATH INSERTION N/A 11/27/2016   Procedure: Glori Luis Cath Insertion;  Surgeon: Algernon Huxley, MD;  Location: [facility];  Service: Cardiovascular;  Laterality: N/A;    SOCIAL HISTORY: Social History   Tobacco Use  . Smoking status: Never Smoker  . Smokeless tobacco: Never Used  Vaping Use  . Vaping Use: Never used  Substance Use Topics  . Alcohol use: Not Currently  . Drug use: No     FAMILY HISTORY Family History  Problem Relation Age of Onset  . Throat cancer Cousin   . Throat cancer Cousin   . Leukemia Cousin     ALLERGIES:  is allergic to omeprazole.  MEDICATIONS:  Current Outpatient Medications  Medication Sig Dispense Refill  . lidocaine-prilocaine (EMLA) cream Apply 1 application topically as needed. Apply small amount to port site at least 1 hour prior to it being accessed, cover with plastic wrap 30 g 1  . vitamin B-12 (CYANOCOBALAMIN) 1000 MCG tablet Take 1 tablet (1,000 mcg total) by mouth daily. 90 tablet 4  . olaparib (LYNPARZA) 150 MG tablet TAKE TWO TABLETS BY MOUTH TWICE A DAY (SWALLOW WHOLE. MAY  TAKE WITH FOOD TO DECREASE NAUSEA AND VOMITING) 120 tablet 2   No current facility-administered medications for this visit.    PHYSICAL EXAMINATION:  ECOG PERFORMANCE STATUS: 0 - Asymptomatic Vitals:   09/08/20 1059  BP: 111/67  Pulse: 66  Resp: 18  Temp: 98.3 F (36.8 C)    Filed Weights   09/08/20 1059  Weight: 120 lb 9.6 oz (54.7 kg)     Physical Exam Constitutional:      General: She is not in acute distress.    Appearance: She is not diaphoretic.     Comments: Thin built, she walks independantly  HENT:     Head: Normocephalic and atraumatic.     Nose: Nose normal.     Mouth/Throat:     Pharynx: No oropharyngeal exudate.  Eyes:     General: No scleral icterus.    Pupils: Pupils are equal, round, and reactive to light.  Neck:     Vascular: No JVD.  Cardiovascular:     Rate and Rhythm: Normal rate and regular rhythm.     Heart sounds: No murmur heard.   Pulmonary:     Effort: Pulmonary effort is normal. No respiratory distress.     Breath sounds: Normal breath sounds. No rales.  Chest:     Chest wall: No tenderness.  Abdominal:     General: Bowel sounds are normal. There is no distension.     Palpations: Abdomen is soft.     Tenderness: There  is no abdominal tenderness.  Musculoskeletal:        General: Normal range of motion.     Cervical back: Normal range of motion and neck supple.  Lymphadenopathy:     Cervical: No cervical adenopathy.  Skin:    General: Skin is warm and dry.     Findings: No erythema.     Comments: Right anterior medi port +   Neurological:     Mental Status: She is alert and oriented to person, place, and time.     Cranial Nerves: No cranial nerve deficit.     Motor: No abnormal muscle tone.     Coordination: Coordination normal.  Psychiatric:        Mood and Affect: Affect normal.        Judgment: Judgment normal.      LABORATORY DATA: I have personally reviewed the data as listed: CBC    Component Value Date/Time    WBC 3.1 (L) 09/08/2020 0937   RBC 2.68 (L) 09/08/2020 0937   HGB 11.0 (L) 09/08/2020 0937   HCT 30.7 (L) 09/08/2020 0937   PLT 142 (L) 09/08/2020 0937   MCV 114.6 (H) 09/08/2020 0937   MCH 41.0 (H) 09/08/2020 0937   MCHC 35.8 09/08/2020 0937   RDW 14.9 09/08/2020 0937   LYMPHSABS 0.8 09/08/2020 0937   MONOABS 0.3 09/08/2020 0937   EOSABS 0.1 09/08/2020 0937   BASOSABS 0.0 09/08/2020 0937   CMP Latest Ref Rng & Units 09/08/2020 07/20/2020 06/08/2020  Glucose 70 - 99 mg/dL 98 92 121(H)  BUN 8 - 23 mg/dL '12 10 15  ' Creatinine 0.44 - 1.00 mg/dL 0.69 0.74 0.72  Sodium 135 - 145 mmol/L 132(L) 132(L) 134(L)  Potassium 3.5 - 5.1 mmol/L 4.2 4.4 3.9  Chloride 98 - 111 mmol/L 100 99 100  CO2 22 - 32 mmol/L '24 26 25  ' Calcium 8.9 - 10.3 mg/dL 9.9 10.1 9.8  Total Protein 6.5 - 8.1 g/dL 7.2 7.2 7.3  Total Bilirubin 0.3 - 1.2 mg/dL 0.8 0.6 0.7  Alkaline Phos 38 - 126 U/L 66 60 70  AST 15 - 41 U/L '15 15 16  ' ALT 0 - 44 U/L '9 8 9    ' Pathology 11/16/2016 Surgical Pathology  CASE: [id number]  PATIENT: Kaleia Hildebran  Surgical Pathology Report   SPECIMEN SUBMITTED:  A. Retroperitoneal adenopathy, left  DIAGNOSIS:  A. LYMPH NODE, LEFT RETROPERITONEAL; CT-GUIDED CORE BIOPSY:  - METASTATIC HIGH-GRADE SEROUS CARCINOMA.  Pathology 03/14/2017   DIAGNOSIS:  A. OMENTUM; OMENTECTOMY:  - NO TUMOR SEEN.  - ONE NEGATIVE LYMPH NODE (0/1).   B. RIGHT FALLOPIAN TUBE AND OVARY; SALPINGO-OOPHORECTOMY:  - SMALL FOCI OF HIGH GRADE SEROUS CARCINOMA INVOLVING THE OVARY.  - MARKED THERAPY RELATED CHANGE.  - NO TUMOR SEEN IN THE FALLOPIAN TUBE.   C. UTERUS, CERVIX, LEFT FALLOPIAN TUBE AND OVARY; HYSTERECTOMY AND LEFT  SALPINGO-OOPHORECTOMY:  - NABOTHIAN CYSTS.  - CYSTIC ATROPHY OF THE ENDOMETRIUM.  - SEROSAL ADHESIONS.  - UNREMARKABLE FALLOPIAN TUBE.  - OVARY SHOWING TREATMENT RELATED CHANGE.   D. 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    RADIOGRAPHIC STUDIES: I have personally reviewed the radiological images as listed and agreed with the findings in the report. CT CHEST ABDOMEN PELVIS W CONTRAST  Result Date: 06/30/2020 CLINICAL DATA:  Ovarian cancer, restaging. EXAM: CT CHEST, ABDOMEN, AND PELVIS WITH CONTRAST TECHNIQUE: Multidetector CT imaging of the chest, abdomen and pelvis was performed following the  standard protocol during bolus administration of intravenous contrast. CONTRAST:  29m OMNIPAQUE IOHEXOL 300 MG/ML  SOLN COMPARISON:  Multiple priors including CT chest abdomen pelvis March 22, 2020 FINDINGS: CT CHEST FINDINGS Cardiovascular: Right chest wall porta catheter with tip in the superior cavoatrial junction. No thoracic aortic aneurysm. Scattered aortic atherosclerosis. No central pulmonary embolus. Normal size heart. No significant pericardial effusion/thickening. Mediastinum/Nodes: No mediastinal, hilar or axillary lymphadenopathy. Normal trachea and esophagus. Or Lungs/Pleura: Similar biapical pleuroparenchymal scarring. Unchanged size of the 5 mm left lower lobe pulmonary nodule on image 102/3. Unchanged size of 3 right lateral subpleural pulmonary nodules which measure 3 mm or smaller on image 135/3. No new or enlarging suspicious pulmonary nodules or masses. No focal consolidation. No pleural effusion. No pneumothorax. Musculoskeletal: No aggressive lytic or blastic lesions of bone. CT ABDOMEN PELVIS FINDINGS Hepatobiliary: No suspicious hepatic lesions. Gallbladder is unremarkable. No biliary ductal dilation. Pancreas: Unremarkable. No pancreatic ductal dilatation or surrounding inflammatory changes. Spleen: Normal in size without focal abnormality. Adrenals/Urinary Tract: Adrenal glands are unremarkable. Tiny bilateral renal hypodensities are too small to accurately characterize but stable favored benign. Kidneys are without renal calculi, solid lesion, or hydronephrosis. Bladder is  unremarkable, for degree of distension. Stomach/Bowel: Enteric contrast traverses the transverse colon. Stomach is grossly unremarkable. Normal positioning of the duodenum/ligament of [place]. No suspicious small bowel wall thickening or dilation. Terminal ileum is unremarkable. The appendix is not confidently identified however there is no pericecal inflammation. Moderate volume stool in the right in transverse segments of the colon. Vascular/Lymphatic: Abdominal aorta atherosclerosis. No abdominal aortic aneurysm. No significant change in the retroperitoneal adenopathy. No new or enlarging lymph nodes in the abdomen or pelvis. Index lymph nodes are as follows: -aortocaval lymph node measures 1.9 cm on image 73/2, unchanged. -retrocaval lymph node measures 1.5 cm on image 75/2 previously 1.6 cm Reproductive: Status post hysterectomy. No adnexal masses. Other: Unchanged size of the soft tissue nodularity located in the posterior pelvis between the rectum and loops of small bowel on image 100/10 measuring 3.3 x 1.3 cm. No abdominopelvic ascites. Musculoskeletal: No aggressive lytic or blastic lesions of bone. IMPRESSION: 1. Stable size and appearance of the pulmonary nodules, retroperitoneal adenopathy, and posterior pelvic soft tissue. No interval findings new or progressive disease. 2. Again seen is prominent stool volume in the right and transverse segments of the colon, possibly compatible with clinical constipation. 3. Aortic atherosclerosis.  Aortic Atherosclerosis (ICD10-I70.0). Electronically Signed   By: JDahlia BailiffMD   On: 06/30/2020 15:11    ASSESSMENT/PLAN Cancer Staging Malignant neoplasm of ovary ([facility] Staging form: Ovary, Fallopian Tube, and Primary Peritoneal Carcinoma, AJCC 8th Edition - Clinical stage from 11/22/2016: Stage IIIC (cT3c, cN1b, cM0) - Signed by YEarlie Server MD on 11/22/2016 Stage used in treatment planning: Yes National guidelines used in treatment planning: Yes Type of  national guideline used in treatment planning: NCCN  1. Malignant neoplasm of ovary, unspecified laterality ([place]   2. [place]rombocytopenia ([place]   3. Encounter for antineoplastic chemotherapy   4. Anemia associated with chemotherapy   5. Lung nodule   6. Port-A-Cath in place   : # Ovarian Cancer, local recurrence.  Currently on olaparib 300 mg twice daily. She tolerates well.  CA 125 18.7-->61.2-->111-->90.5-->69--> 54.8--> 29.1-->47-->71.2-->67.7->73.2-->82.8-->70.4-->66.4--> 58.4--> 64.9--> 56.9--> 42.2-->47.7-->54.4-->68.2-> pending Recommend her to continue olaparib 300 mg daily.   Labs are reviewed and discussed with patient. Repeat CT chest abdomen pelvis in late June 2022. .Marland KitchenChronic anemia, hemoglobin stable at 11.7.  Chemotherapy-induced.  Stable and continue to monitor.   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   Thrombocytopenia, grade 1, platelet count at 142,000 today.  Monitor.  Port-A-Cath in place, we will schedule her for port flush every  8 weeks.  RTF 6 weeks  Orders Placed This Encounter  Procedures  . CT CHEST ABDOMEN PELVIS W CONTRAST    Standing Status:   Future    Standing Expiration Date:   09/08/2021    Order Specific Question:   If indicated for the ordered procedure, I authorize the administration of contrast media per Radiology protocol    Answer:   Yes    Order Specific Question:   Preferred imaging location?    Answer:   [facility]    Order Specific Question:   Is Oral Contrast requested for this exam?    Answer:   Yes, Per Radiology protocol  . CBC with Differential/Platelet    Standing Status:   Future    Standing Expiration Date:   09/08/2021  . Comprehensive metabolic panel    Standing Status:   Future    Standing Expiration Date:   09/08/2021  . CA 125    Standing Status:   Future    Standing Expiration Date:   09/08/2021    Earlie Server, MD, PhD Hematology Oncology [facility] Pager-  [phone] 09/08/20

## 2020-09-09 LAB — CA 125: Cancer Antigen (CA) 125: 97 U/mL — ABNORMAL HIGH (ref 0.0–38.1)

## 2020-10-06 ENCOUNTER — Ambulatory Visit
Admission: RE | Admit: 2020-10-06 | Discharge: 2020-10-06 | Disposition: A | Discharge status: Home / Self Care | Payer: Medicare Other | Source: Ambulatory Visit | Attending: Oncology | Admitting: Oncology

## 2020-10-06 ENCOUNTER — Other Ambulatory Visit: Payer: Self-pay

## 2020-10-06 DIAGNOSIS — J929 Pleural plaque without asbestos: Secondary | ICD-10-CM | POA: Diagnosis not present

## 2020-10-06 DIAGNOSIS — R918 Other nonspecific abnormal finding of lung field: Secondary | ICD-10-CM | POA: Diagnosis not present

## 2020-10-06 DIAGNOSIS — Z90710 Acquired absence of both cervix and uterus: Secondary | ICD-10-CM | POA: Diagnosis not present

## 2020-10-06 DIAGNOSIS — R911 Solitary pulmonary nodule: Secondary | ICD-10-CM | POA: Insufficient documentation

## 2020-10-06 DIAGNOSIS — R590 Localized enlarged lymph nodes: Secondary | ICD-10-CM | POA: Diagnosis not present

## 2020-10-06 DIAGNOSIS — C569 Malignant neoplasm of unspecified ovary: Secondary | ICD-10-CM | POA: Insufficient documentation

## 2020-10-06 MED ORDER — IOHEXOL 300 MG/ML  SOLN
75.0000 mL | Freq: Once | INTRAMUSCULAR | Status: AC | PRN
  Administered 2020-10-06: 75 mL via INTRAVENOUS

## 2020-10-18 ENCOUNTER — Inpatient Hospital Stay: Payer: Medicare Other | Attending: Oncology

## 2020-10-18 DIAGNOSIS — C569 Malignant neoplasm of unspecified ovary: Secondary | ICD-10-CM | POA: Diagnosis not present

## 2020-10-18 DIAGNOSIS — Z5111 Encounter for antineoplastic chemotherapy: Secondary | ICD-10-CM | POA: Insufficient documentation

## 2020-10-18 DIAGNOSIS — Z5112 Encounter for antineoplastic immunotherapy: Secondary | ICD-10-CM | POA: Insufficient documentation

## 2020-10-18 DIAGNOSIS — D6481 Anemia due to antineoplastic chemotherapy: Secondary | ICD-10-CM | POA: Insufficient documentation

## 2020-10-18 LAB — CBC WITH DIFFERENTIAL/PLATELET
Abs Immature Granulocytes: 0.02 10*3/uL (ref 0.00–0.07)
Basophils Absolute: 0 10*3/uL (ref 0.0–0.1)
Basophils Relative: 1 %
Eosinophils Absolute: 0.1 10*3/uL (ref 0.0–0.5)
Eosinophils Relative: 2 %
HCT: 32.2 % — ABNORMAL LOW (ref 36.0–46.0)
Hemoglobin: 11.2 g/dL — ABNORMAL LOW (ref 12.0–15.0)
Immature Granulocytes: 1 %
Lymphocytes Relative: 24 %
Lymphs Abs: 0.8 10*3/uL (ref 0.7–4.0)
MCH: 41 pg — ABNORMAL HIGH (ref 26.0–34.0)
MCHC: 34.8 g/dL (ref 30.0–36.0)
MCV: 117.9 fL — ABNORMAL HIGH (ref 80.0–100.0)
Monocytes Absolute: 0.3 10*3/uL (ref 0.1–1.0)
Monocytes Relative: 9 %
Neutro Abs: 2.1 10*3/uL (ref 1.7–7.7)
Neutrophils Relative %: 63 %
Platelets: 145 10*3/uL — ABNORMAL LOW (ref 150–400)
RBC: 2.73 MIL/uL — ABNORMAL LOW (ref 3.87–5.11)
RDW: 14.8 % (ref 11.5–15.5)
WBC: 3.2 10*3/uL — ABNORMAL LOW (ref 4.0–10.5)
nRBC: 0 % (ref 0.0–0.2)

## 2020-10-18 LAB — COMPREHENSIVE METABOLIC PANEL
ALT: 9 U/L (ref 0–44)
AST: 17 U/L (ref 15–41)
Albumin: 4.6 g/dL (ref 3.5–5.0)
Alkaline Phosphatase: 65 U/L (ref 38–126)
Anion gap: 7 (ref 5–15)
BUN: 10 mg/dL (ref 8–23)
CO2: 26 mmol/L (ref 22–32)
Calcium: 10.2 mg/dL (ref 8.9–10.3)
Chloride: 100 mmol/L (ref 98–111)
Creatinine, Ser: 0.73 mg/dL (ref 0.44–1.00)
GFR, Estimated: >60 mL/min (ref >60)
Glucose, Bld: 94 mg/dL (ref 70–99)
Potassium: 4.4 mmol/L (ref 3.5–5.1)
Sodium: 133 mmol/L — ABNORMAL LOW (ref 135–145)
Total Bilirubin: 0.6 mg/dL (ref 0.3–1.2)
Total Protein: 7.5 g/dL (ref 6.5–8.1)

## 2020-10-19 LAB — CA 125: Cancer Antigen (CA) 125: 168 U/mL — ABNORMAL HIGH (ref 0.0–38.1)

## 2020-10-20 ENCOUNTER — Encounter: Payer: Self-pay | Admitting: Oncology

## 2020-10-20 ENCOUNTER — Telehealth: Payer: Self-pay

## 2020-10-20 ENCOUNTER — Other Ambulatory Visit: Payer: Self-pay

## 2020-10-20 ENCOUNTER — Inpatient Hospital Stay (HOSPITAL_BASED_OUTPATIENT_CLINIC_OR_DEPARTMENT_OTHER): Payer: Medicare Other | Admitting: Oncology

## 2020-10-20 VITALS — BP 99/59 | HR 71 | Temp 98.3°F | Resp 18 | Wt 120.6 lb

## 2020-10-20 DIAGNOSIS — D6481 Anemia due to antineoplastic chemotherapy: Secondary | ICD-10-CM

## 2020-10-20 DIAGNOSIS — C569 Malignant neoplasm of unspecified ovary: Secondary | ICD-10-CM

## 2020-10-20 DIAGNOSIS — Z01818 Encounter for other preprocedural examination: Secondary | ICD-10-CM

## 2020-10-20 DIAGNOSIS — T451X5A Adverse effect of antineoplastic and immunosuppressive drugs, initial encounter: Secondary | ICD-10-CM | POA: Diagnosis not present

## 2020-10-20 DIAGNOSIS — Z5111 Encounter for antineoplastic chemotherapy: Secondary | ICD-10-CM

## 2020-10-20 DIAGNOSIS — D696 Thrombocytopenia, unspecified: Secondary | ICD-10-CM | POA: Diagnosis not present

## 2020-10-20 DIAGNOSIS — Z5112 Encounter for antineoplastic immunotherapy: Secondary | ICD-10-CM | POA: Diagnosis not present

## 2020-10-20 DIAGNOSIS — Z0189 Encounter for other specified special examinations: Secondary | ICD-10-CM

## 2020-10-20 NOTE — Telephone Encounter (Signed)
Please schedule patient for Echo OR MUGA (whichever can be done sooner) within 1-2 weeks. lab/MD/ Doxil/ Bevacizumab *new* will have to be r/s until after echo/muga.  I will change order STAT if either cant be done within 1-2 weeks .  Please notify pt of new appts.

## 2020-10-20 NOTE — Progress Notes (Signed)
ON PATHWAY REGIMEN - Ovarian  No Change  Continue With Treatment as Ordered.  Original Decision Date/Time: 11/22/2016 21:53     A cycle is every 21 days:     Paclitaxel      Carboplatin   **Always confirm dose/schedule in your pharmacy ordering system**  Patient Characteristics: Newly Diagnosed, Neoadjuvant Therapy AJCC T Category: T3c AJCC N Category: N1b AJCC M Category: M0 Therapeutic Status: Newly Diagnosed, Neoadjuvant Therapy AJCC 8 Stage Grouping: IIIC Intent of Therapy: Curative Intent, Discussed with Patient

## 2020-10-20 NOTE — Addendum Note (Signed)
Addended by: Earlie Server on: 10/20/2020 04:44 PM   Modules accepted: Orders

## 2020-10-20 NOTE — Progress Notes (Signed)
DISCONTINUE ON PATHWAY REGIMEN - Ovarian     A cycle is every 21 days:     Paclitaxel      Carboplatin   **Always confirm dose/schedule in your pharmacy ordering system**  REASON: Disease Progression PRIOR TREATMENT: [id number]: Carboplatin AUC=6 + Paclitaxel 175 mg/m2 q21 Days x 2-4 Cycles TREATMENT RESPONSE: Progressive Disease (PD)  START ON PATHWAY REGIMEN - Ovarian     A cycle is every 28 days:     Liposomal doxorubicin      Bevacizumab-xxxx   **Always confirm dose/schedule in your pharmacy ordering system**  Patient Characteristics: Recurrent or Progressive Disease, Third Line, Platinum Resistant or < 6 Months Since Last Platinum Therapy BRCA Mutation Status: Absent Therapeutic Status: Recurrent or Progressive Disease Line of Therapy: Third Line  Intent of Therapy: Non-Curative / Palliative Intent, Discussed with Patient

## 2020-10-20 NOTE — Progress Notes (Signed)
[facility] Follow up visit  Patient Care Team: Patient, No Pcp Per (Inactive) as PCP - General (General Practice) Clent Jacks, [name], [name], [name], MD as Referring Physician (Obstetrics and Gynecology)  CHIEF COMPLAINTS/PURPOSE OF Visit Follow up for chemotherapy tolerability of  ovarian cancer.  HISTORY OF PRESENTING ILLNESS: Katie Hill 70 y.o. female presents for follow up of management of stage IIIC ovarian cancer. She underwent  Neoadjuvant chemotherapy of carbo and taxol x 4  # Patient had debulking surgery on 03/14/2017. She had a laparoscopy with conversion to laparotomy, total hysterectomy, with bilateral salpingo oophorectomy, right aortic lymph node dissection, omentectomy. Pathology showed small foci of residual disease in ovary.   Genetic testing negative for 83 genes on [facility]'s Multi-Cancer panel (ALK, APC, ATM, AXIN2, BAP1, BARD1, BLM, BMPR1A, BRCA1, BRCA2, BRIP1, CASR, CDC73, CDH1, CDK4, CDKN1B, CDKN1C, CDKN2A, CEBPA, CHEK2, CTNNA1, DICER1, DIS3L2, EGFR, EPCAM, FH, FLCN, GATA2, GPC3, GREM1, HOXB13, HRAS, KIT, MAX, MEN1, MET, MITF, MLH1, MSH2, MSH3, MSH6, MUTYH, NBN, NF1, NF2, NTHL1, PALB2, PDGFRA, PHOX2B, PMS2, POLD1, POLE, POT1, PRKAR1A, PTCH1, PTEN, RAD50, RAD51C, RAD51D, RB1, RECQL4, RET, RUNX1, SDHA, SDHAF2, SDHB, SDHC, SDHD, SMAD4, SMARCA4, SMARCB1, SMARCE1, STK11, SUFU, TERC, TERT, TMEM127, TP53, TSC1, TSC2, VHL, WRN, WT1).   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  Myraid testing negative for somatic BRACA1/2, positive for HRD.   # HRD positive, was referred to [facility] for clinical trials of Olarparib maintenance. She opted out.  #  Treatment:  Stage IIIC Ovarian cancer:  #s/p Carboplatin and taxol x 4 neoadjuvant chemotherapy followed by debulking surgery  03/14/2017 # 03/28/2017 S/p Adjuvant carbo and taxol x3   Local recurrence, CA125 one 46.3 03/15/2018-05/17/2018 Carboplatin and Taxol x 4. CA125 decrease from 49.7 to 6 after 4 cycles of treatment.  Started on Olarparib 317m BID on 05/17/2018.  # was seen by Dr. BFransisca Connorsfor further evaluation due to rising Ca1 25 and MRI abdomenPelvis on 11/05/2018 showed Interval progression of, now measuring 2.7 x 2.3 cm and concerning for progression of metastatic disease.retrocaval lymph node in the abdomen Olaparib was held for short period of time. Her CA125 trended down again.  Dr. BFransisca Connorsrecommending resume olaparib and continue monitor. If she has more significant progression of disease she may be a candidate for clinical trial or will be treated with other chemotherapy drugs including Doxil, gemcitabine and Avastin.   INTERVAL HISTORY 70y.o. female with above oncology history reviewed by me today presents for evaluation for chemotherapy for treatment of ovarian cancer  Patient was accompanied by her husband. Patient takes olaparib.  Overall she tolerates well.  Appetite is good, she has no new complaints today. . Review of Systems  Constitutional:  Negative for appetite change, chills, fatigue and fever.  HENT:   Negative for hearing loss and voice change.   Eyes:  Negative for eye problems.  Respiratory:  Negative for chest tightness and cough.   Cardiovascular:  Negative for chest pain.  Gastrointestinal:  Negative for abdominal distention, abdominal pain and blood in stool.  Endocrine: Negative for hot flashes.  Genitourinary:  Negative for difficulty urinating and frequency.   Musculoskeletal:  Negative for arthralgias.  Skin:  Negative for itching and rash.  Neurological:  Negative for extremity weakness.  Hematological:  Negative for adenopathy.  Psychiatric/Behavioral:  Negative for confusion.     .[name]  MEDICAL HISTORY: Past Medical History:  Diagnosis Date   Dysrhythmia    Genetic  testing 03/28/2017   Multi-Cancer panel (83 genes) @ [facility] - No pathogenic mutations detected   High grade ovarian cancer ([place]) 11/20/2016   Pelvic mass in female     SURGICAL HISTORY: Past Surgical History:  Procedure Laterality Date   APPENDECTOMY     LAPAROSCOPY N/A 03/14/2017   Procedure: LAPAROSCOPY OPERATIVE;  Surgeon: Mellody Drown, MD;  Location: [facility];  Service: Gynecology;  Laterality: N/A;   LAPAROTOMY N/A 03/14/2017   Procedure: LAPAROTOMY;  Surgeon: Mellody Drown, MD;  Location: [facility];  Service: Gynecology;  Laterality: N/A;   LYMPH NODE DISSECTION N/A 03/14/2017   Procedure: LYMPH NODE DISSECTION;  Surgeon: Mellody Drown, MD;  Location: [facility];  Service: Gynecology;  Laterality: N/A;   OMENTECTOMY N/A 03/14/2017   Procedure: OMENTECTOMY;  Surgeon: Mellody Drown, MD;  Location: [facility];  Service: Gynecology;  Laterality: N/A;   PORTA CATH INSERTION N/A 11/27/2016   Procedure: Glori Luis Cath Insertion;  Surgeon: Algernon Huxley, MD;  Location: [place] [facility];  Service: Cardiovascular;  Laterality: N/A;    SOCIAL HISTORY: Social History   Tobacco Use   Smoking status: Never   Smokeless tobacco: Never  Vaping Use   Vaping Use: Never used  Substance Use Topics   Alcohol use: Not Currently   Drug use: No     FAMILY HISTORY Family History  Problem Relation Age of Onset   Throat cancer Cousin    Throat cancer Cousin    Leukemia Cousin     ALLERGIES:  is allergic to omeprazole.  MEDICATIONS:  Current Outpatient Medications  Medication Sig Dispense Refill   lidocaine-prilocaine (EMLA) cream Apply 1 application topically as needed. Apply small amount to port site at least 1 hour prior to it being accessed, cover with plastic wrap 30 g 1   olaparib (LYNPARZA) 150 MG tablet TAKE TWO TABLETS BY MOUTH TWICE A DAY (SWALLOW WHOLE. MAY TAKE WITH FOOD TO DECREASE NAUSEA AND VOMITING) 120 tablet 2   vitamin B-12 (CYANOCOBALAMIN) 1000 MCG tablet Take 1  tablet (1,000 mcg total) by mouth daily. 90 tablet 4   No current facility-administered medications for this visit.    PHYSICAL EXAMINATION:  ECOG PERFORMANCE STATUS: 0 - Asymptomatic Vitals:   10/20/20 0957  BP: (!) 99/59  Pulse: 71  Resp: 18  Temp: 98.3 F (36.8 C)    Filed Weights   10/20/20 0957  Weight: 120 lb 9.6 oz (54.7 kg)     Physical Exam Constitutional:      General: She is not in acute distress.    Appearance: She is not diaphoretic.     Comments: Thin built, she walks independantly  HENT:     Head: Normocephalic and atraumatic.     Nose: Nose normal.     Mouth/Throat:     Pharynx: No oropharyngeal exudate.  Eyes:     General: No scleral icterus.    Pupils: Pupils are equal, round, and reactive to light.  Neck:     Vascular: No JVD.  Cardiovascular:     Rate and Rhythm: Normal rate and regular rhythm.     Heart sounds: No murmur heard. Pulmonary:     Effort: Pulmonary effort is normal. No respiratory distress.     Breath sounds: Normal breath sounds. No rales.  Chest:     Chest wall: No tenderness.  Abdominal:     General: Bowel sounds are normal. There  is no distension.     Palpations: Abdomen is soft.     Tenderness: There is no abdominal tenderness.  Musculoskeletal:        General: Normal range of motion.     Cervical back: Normal range of motion and neck supple.  Lymphadenopathy:     Cervical: No cervical adenopathy.  Skin:    General: Skin is warm and dry.     Findings: No erythema.     Comments: Right anterior medi port +   Neurological:     Mental Status: She is alert and oriented to person, place, and time.     Cranial Nerves: No cranial nerve deficit.     Motor: No abnormal muscle tone.     Coordination: Coordination normal.  Psychiatric:        Mood and Affect: Affect normal.        Judgment: Judgment normal.     LABORATORY DATA: I have personally reviewed the data as listed: CBC    Component Value Date/Time   WBC 3.2  (L) 10/18/2020 1028   RBC 2.73 (L) 10/18/2020 1028   HGB 11.2 (L) 10/18/2020 1028   HCT 32.2 (L) 10/18/2020 1028   PLT 145 (L) 10/18/2020 1028   MCV 117.9 (H) 10/18/2020 1028   MCH 41.0 (H) 10/18/2020 1028   MCHC 34.8 10/18/2020 1028   RDW 14.8 10/18/2020 1028   LYMPHSABS 0.8 10/18/2020 1028   MONOABS 0.3 10/18/2020 1028   EOSABS 0.1 10/18/2020 1028   BASOSABS 0.0 10/18/2020 1028   CMP Latest Ref Rng & Units 10/18/2020 09/08/2020 07/20/2020  Glucose 70 - 99 mg/dL 94 98 92  BUN 8 - 23 mg/dL '10 12 10  ' Creatinine 0.44 - 1.00 mg/dL 0.73 0.69 0.74  Sodium 135 - 145 mmol/L 133(L) 132(L) 132(L)  Potassium 3.5 - 5.1 mmol/L 4.4 4.2 4.4  Chloride 98 - 111 mmol/L 100 100 99  CO2 22 - 32 mmol/L '26 24 26  ' Calcium 8.9 - 10.3 mg/dL 10.2 9.9 10.1  Total Protein 6.5 - 8.1 g/dL 7.5 7.2 7.2  Total Bilirubin 0.3 - 1.2 mg/dL 0.6 0.8 0.6  Alkaline Phos 38 - 126 U/L 65 66 60  AST 15 - 41 U/L '17 15 15  ' ALT 0 - 44 U/L '9 9 8    ' Pathology 11/16/2016 Surgical Pathology  CASE: [id number]  PATIENT: Charina Justman  Surgical Pathology Report   SPECIMEN SUBMITTED:  A. Retroperitoneal adenopathy, left  DIAGNOSIS:  A. LYMPH NODE, LEFT RETROPERITONEAL; CT-GUIDED CORE BIOPSY:  - METASTATIC HIGH-GRADE SEROUS CARCINOMA.  Pathology 03/14/2017   DIAGNOSIS:  A. OMENTUM; OMENTECTOMY:  - NO TUMOR SEEN.  - ONE NEGATIVE LYMPH NODE (0/1).   B.  RIGHT FALLOPIAN TUBE AND OVARY; SALPINGO-OOPHORECTOMY:  - SMALL FOCI OF HIGH GRADE SEROUS CARCINOMA INVOLVING THE OVARY.  - MARKED THERAPY RELATED CHANGE.  - NO TUMOR SEEN IN THE FALLOPIAN TUBE.   C.  UTERUS, CERVIX, LEFT FALLOPIAN TUBE AND OVARY; HYSTERECTOMY AND LEFT  SALPINGO-OOPHORECTOMY:  - NABOTHIAN CYSTS.  - CYSTIC ATROPHY OF THE ENDOMETRIUM.  - SEROSAL ADHESIONS.  - UNREMARKABLE FALLOPIAN TUBE.  - OVARY SHOWING TREATMENT RELATED CHANGE.   D.  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    RADIOGRAPHIC STUDIES: I have personally reviewed the radiological images as listed and agreed with the findings in the report. CT CHEST ABDOMEN PELVIS W CONTRAST  Result Date: 10/07/2020 CLINICAL DATA:  Follow-up malignant ovarian carcinoma. Initial  diagnosis 2018. Chemotherapy completed 2018. Recurrence 2019 with chemotherapy. Surgical history of appendectomy a omentectomy and total hysterectomy. EXAM: CT CHEST, ABDOMEN, AND PELVIS WITH CONTRAST TECHNIQUE: Multidetector CT imaging of the chest, abdomen and pelvis was performed following the standard protocol during bolus administration of intravenous contrast. CONTRAST:  108m OMNIPAQUE IOHEXOL 300 MG/ML  SOLN COMPARISON:  CT 06/30/2020 FINDINGS: CT CHEST FINDINGS Cardiovascular: No significant vascular findings. Normal heart size. No pericardial effusion. Port in the anterior chest wall with tip in distal SVC. Mediastinum/Nodes: New prevascular lymph node in the anterior mediastinum measuring 9 mm (image 23/series 2). Slightly more superior prevascular node measures 9 mm on image 19. High LEFT paratracheal node measures 11 mm on image 14. No supraclavicular adenopathy. Lungs/Pleura: Biapical pleuroparenchymal thickening. Round nodule in the RIGHT lower lobe measuring 4 mm (image 97/3) is new from comparison exam. Musculoskeletal: No aggressive osseous lesion. CT ABDOMEN AND PELVIS FINDINGS Hepatobiliary: No focal hepatic lesion. No biliary ductal dilatation. Gallbladder is normal. Common bile duct is normal. Pancreas: Pancreas is normal. No ductal dilatation. No pancreatic inflammation. Spleen: Normal spleen Adrenals/urinary tract: Adrenal glands and kidneys are normal. The ureters and bladder normal. Stomach/Bowel: Stomach small-bowel cecum normal. Colon and rectosigmoid colon normal. Vascular/Lymphatic: Periaortic retroperitoneal node LEFT aorta at the level of the renal vein measures 16 mm (image 69/2) compared to 11 mm. Elongated lymph  node position between the IVC and aorta measures 2.3 x 3.9 cm compared with 2.6 x 3.8 cm for no significant change. No pelvic lymphadenopathy. Reproductive: Post hysterectomy.  Adnexa unremarkable Other: No peritoneal metastasis. Trace free fluid in the pelvis is unchanged (image 110/2) Musculoskeletal: No aggressive osseous lesion. IMPRESSION: Chest Impression: 1. New lymph nodes in the prevascular space of the upper anterior mediastinum most consistent with lymphangitic spread of carcinoma. 2. Single new RIGHT lower lobe pulmonary nodule is concerning for early pulmonary metastasis. Abdomen / Pelvis Impression: 1. Interval enlargement of a LEFT periaortic retroperitoneal lymph node. Stable adjacent aortocaval adenopathy. 2. No peritoneal metastasis or ascites.  No liver metastasis. Electronically Signed   By: SSuzy BouchardM.D.   On: 10/07/2020 08:38     ASSESSMENT/PLAN Cancer Staging Malignant neoplasm of ovary ([facility] Staging form: Ovary, Fallopian Tube, and Primary Peritoneal Carcinoma, AJCC 8th Edition - Clinical stage from 11/22/2016: Stage IIIC (cT3c, cN1b, cM0) - Signed by YEarlie Server MD on 11/22/2016 Stage used in treatment planning: Yes National guidelines used in treatment planning: Yes Type of national guideline used in treatment planning: NCCN  1. Malignant neoplasm of ovary, unspecified laterality ([place]   2. Thrombocytopenia ([place]   3. Encounter for antineoplastic chemotherapy   4. Anemia associated with chemotherapy   : # Ovarian Cancer, local recurrence.  Currently on olaparib 300 mg twice daily. She tolerates well.  CA 125 18.7-->61.2-->111-->90.5-->69--> 54.8--> 29.1-->47-->71.2-->67.7->73.2-->82.8-->70.4-->66.4--> 58.4--> 64.9--> 56.9--> 42.2-->47.7-->54.4-->68.2-> 97-->168 10/06/2020, CT chest abdomen pelvis with contrast showed new lymph nodes in the prevascular space of the upper anterior mediastinum most consistent with metastasis.  Single new right lower lobe pulmonary nodule  is concerning for early pulmonary metastasis.  Interval enlargement of the left periaortic retroperitoneal lymph node.  Stable adjacent aorto caval adenopathy.  I had a discussion with patient that these findings are consistent with disease progression clinically.  Mediastinal lymph nodes and the new lung nodule are very small, probably below Detective sensitivity limit on PET scan.  Biopsy can be difficult due to the size as well.  CA125 has significantly increased since last visit, consistent with the radiographic  findings.  I recommend patient to stop olaparib and switch to second line treatment with Doxil and bevacizumab. I explained to the patient the risks and benefits of chemotherapy Doxil and bevacizumab including all but not limited to infusion reaction, hair loss, hearing loss, mouth sore, nausea, vomiting, low blood counts, bleeding, perforation, increased blood pressure, heart failure, kidney failure and risk of life threatening infection and even death, secondary malignancy etc.  Patient voices understanding and willing to proceed chemotherapy.  Discussed with [facility] Dr. Fransisca Connors who agrees with the plan.  No available clinical trials at [facility] . Chronic anemia, hemoglobin stable at 11.2.  Chemotherapy-induced.  Stable and continue to monitor.   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   Thrombocytopenia, grade 1, platelet count at 145,000 today.  Monitor.  Port-A-Cath in place, we will schedule her for port flush every  8 weeks. Baseline Echocardiogram.  RTF 1-2 weeks to start first cycle of Doxil and bevacizumab. Orders Placed This Encounter  Procedures   Protein, urine, random    Standing Status:   Standing    Number of Occurrences:   20    Standing Expiration Date:   10/20/2021   CBC with Differential    Standing Status:   Standing    Number of Occurrences:   20    Standing Expiration Date:   10/20/2021   Comprehensive metabolic panel    Standing Status:   Standing    Number  of Occurrences:   20    Standing Expiration Date:   10/20/2021   PHYSICIAN COMMUNICATION ORDER    A baseline Echo/ [place] should be obtained prior to initiation of Anthracycline Chemotherapy     Earlie Server, MD, PhD Hematology Oncology [facility] Pager- [phone] 10/20/20

## 2020-10-21 ENCOUNTER — Other Ambulatory Visit: Payer: Self-pay

## 2020-10-21 ENCOUNTER — Encounter: Payer: Self-pay | Admitting: Oncology

## 2020-10-21 DIAGNOSIS — Z8544 Personal history of malignant neoplasm of other female genital organs: Secondary | ICD-10-CM

## 2020-10-21 DIAGNOSIS — Z01818 Encounter for other preprocedural examination: Secondary | ICD-10-CM

## 2020-10-21 DIAGNOSIS — Z8543 Personal history of malignant neoplasm of ovary: Secondary | ICD-10-CM

## 2020-10-21 DIAGNOSIS — C569 Malignant neoplasm of unspecified ovary: Secondary | ICD-10-CM

## 2020-10-21 NOTE — Telephone Encounter (Signed)
Appointments rescheduled.  Patient notified and confirmed.

## 2020-10-26 ENCOUNTER — Inpatient Hospital Stay: Payer: Medicare Other

## 2020-10-26 ENCOUNTER — Inpatient Hospital Stay: Payer: Medicare Other | Admitting: Oncology

## 2020-10-29 ENCOUNTER — Encounter
Admission: RE | Admit: 2020-10-29 | Discharge: 2020-10-29 | Disposition: A | Discharge status: Home / Self Care | Payer: Medicare Other | Source: Ambulatory Visit | Attending: Oncology | Admitting: Oncology

## 2020-10-29 ENCOUNTER — Other Ambulatory Visit: Payer: Self-pay

## 2020-10-29 DIAGNOSIS — Z5111 Encounter for antineoplastic chemotherapy: Secondary | ICD-10-CM | POA: Diagnosis not present

## 2020-10-29 DIAGNOSIS — C569 Malignant neoplasm of unspecified ovary: Secondary | ICD-10-CM | POA: Insufficient documentation

## 2020-10-29 DIAGNOSIS — Z01818 Encounter for other preprocedural examination: Secondary | ICD-10-CM | POA: Diagnosis not present

## 2020-10-29 MED ORDER — TECHNETIUM TC 99M-LABELED RED BLOOD CELLS IV KIT
20.0000 | PACK | Freq: Once | INTRAVENOUS | Status: AC | PRN
  Administered 2020-10-29: 20.5 via INTRAVENOUS

## 2020-11-03 ENCOUNTER — Inpatient Hospital Stay: Payer: Medicare Other

## 2020-11-03 ENCOUNTER — Ambulatory Visit: Payer: Medicare Other

## 2020-11-03 ENCOUNTER — Encounter: Payer: Self-pay | Admitting: Oncology

## 2020-11-03 ENCOUNTER — Inpatient Hospital Stay (HOSPITAL_BASED_OUTPATIENT_CLINIC_OR_DEPARTMENT_OTHER): Payer: Medicare Other | Admitting: Oncology

## 2020-11-03 VITALS — BP 116/66 | HR 61 | Temp 97.9°F | Resp 18 | Ht 68.5 in | Wt 121.2 lb

## 2020-11-03 DIAGNOSIS — D6481 Anemia due to antineoplastic chemotherapy: Secondary | ICD-10-CM | POA: Diagnosis not present

## 2020-11-03 DIAGNOSIS — C569 Malignant neoplasm of unspecified ovary: Secondary | ICD-10-CM

## 2020-11-03 DIAGNOSIS — D696 Thrombocytopenia, unspecified: Secondary | ICD-10-CM | POA: Diagnosis not present

## 2020-11-03 DIAGNOSIS — Z5112 Encounter for antineoplastic immunotherapy: Secondary | ICD-10-CM | POA: Diagnosis not present

## 2020-11-03 DIAGNOSIS — T451X5A Adverse effect of antineoplastic and immunosuppressive drugs, initial encounter: Secondary | ICD-10-CM

## 2020-11-03 DIAGNOSIS — Z5111 Encounter for antineoplastic chemotherapy: Secondary | ICD-10-CM

## 2020-11-03 LAB — COMPREHENSIVE METABOLIC PANEL
ALT: 8 U/L (ref 0–44)
AST: 15 U/L (ref 15–41)
Albumin: 4.2 g/dL (ref 3.5–5.0)
Alkaline Phosphatase: 54 U/L (ref 38–126)
Anion gap: 4 — ABNORMAL LOW (ref 5–15)
BUN: 9 mg/dL (ref 8–23)
CO2: 24 mmol/L (ref 22–32)
Calcium: 9.5 mg/dL (ref 8.9–10.3)
Chloride: 105 mmol/L (ref 98–111)
Creatinine, Ser: 0.6 mg/dL (ref 0.44–1.00)
GFR, Estimated: >60 mL/min (ref >60)
Glucose, Bld: 88 mg/dL (ref 70–99)
Potassium: 3.9 mmol/L (ref 3.5–5.1)
Sodium: 133 mmol/L — ABNORMAL LOW (ref 135–145)
Total Bilirubin: 0.5 mg/dL (ref 0.3–1.2)
Total Protein: 6.9 g/dL (ref 6.5–8.1)

## 2020-11-03 LAB — PROTEIN, URINE, RANDOM: Total Protein, Urine: <6 mg/dL

## 2020-11-03 LAB — CBC WITH DIFFERENTIAL/PLATELET
Abs Immature Granulocytes: 0.01 10*3/uL (ref 0.00–0.07)
Basophils Absolute: 0 10*3/uL (ref 0.0–0.1)
Basophils Relative: 1 %
Eosinophils Absolute: 0.1 10*3/uL (ref 0.0–0.5)
Eosinophils Relative: 2 %
HCT: 30.4 % — ABNORMAL LOW (ref 36.0–46.0)
Hemoglobin: 10.4 g/dL — ABNORMAL LOW (ref 12.0–15.0)
Immature Granulocytes: 0 %
Lymphocytes Relative: 27 %
Lymphs Abs: 0.8 10*3/uL (ref 0.7–4.0)
MCH: 40.6 pg — ABNORMAL HIGH (ref 26.0–34.0)
MCHC: 34.2 g/dL (ref 30.0–36.0)
MCV: 118.8 fL — ABNORMAL HIGH (ref 80.0–100.0)
Monocytes Absolute: 0.3 10*3/uL (ref 0.1–1.0)
Monocytes Relative: 11 %
Neutro Abs: 1.8 10*3/uL (ref 1.7–7.7)
Neutrophils Relative %: 59 %
Platelets: 146 10*3/uL — ABNORMAL LOW (ref 150–400)
RBC: 2.56 MIL/uL — ABNORMAL LOW (ref 3.87–5.11)
RDW: 14.4 % (ref 11.5–15.5)
WBC: 3 10*3/uL — ABNORMAL LOW (ref 4.0–10.5)
nRBC: 0 % (ref 0.0–0.2)

## 2020-11-03 MED ORDER — HEPARIN SOD (PORK) LOCK FLUSH 100 UNIT/ML IV SOLN
500.0000 [IU] | Freq: Once | INTRAVENOUS | Status: AC | PRN
Start: 2020-11-03 — End: 2020-11-03
  Administered 2020-11-03: 500 [IU]
  Filled 2020-11-03: qty 5

## 2020-11-03 MED ORDER — HEPARIN SOD (PORK) LOCK FLUSH 100 UNIT/ML IV SOLN
INTRAVENOUS | Status: AC
  Filled 2020-11-03: qty 5

## 2020-11-03 MED ORDER — SODIUM CHLORIDE 0.9 % IV SOLN
10.0000 mg | Freq: Once | INTRAVENOUS | Status: AC
  Administered 2020-11-03: 10 mg via INTRAVENOUS
  Filled 2020-11-03: qty 10

## 2020-11-03 MED ORDER — SODIUM CHLORIDE 0.9 % IV SOLN
10.0000 mg/kg | Freq: Once | INTRAVENOUS | Status: AC
  Administered 2020-11-03: 500 mg via INTRAVENOUS
  Filled 2020-11-03: qty 16

## 2020-11-03 MED ORDER — SODIUM CHLORIDE 0.9 % IV SOLN
Freq: Once | INTRAVENOUS | Status: AC
  Filled 2020-11-03: qty 250

## 2020-11-03 MED ORDER — DEXTROSE 5 % IV SOLN
Freq: Once | INTRAVENOUS | Status: AC
  Filled 2020-11-03: qty 250

## 2020-11-03 MED ORDER — DEXTROSE 5 % IV SOLN
70.0000 mg | Freq: Once | INTRAVENOUS | Status: AC
  Administered 2020-11-03: 70 mg via INTRAVENOUS
  Filled 2020-11-03: qty 25

## 2020-11-03 MED ORDER — DOXORUBICIN HCL LIPOSOMAL CHEMO INJECTION 2 MG/ML
40.0000 mg/m2 | Freq: Once | INTRAVENOUS | Status: DC
  Filled 2020-11-03: qty 32

## 2020-11-03 NOTE — Progress Notes (Signed)
[facility] Follow up visit  Patient Care Team: Patient, No Pcp Per (Inactive) as PCP - General (General Practice) Clent Jacks, [name], [name], [name], MD as Referring Physician (Obstetrics and Gynecology)  CHIEF COMPLAINTS/PURPOSE OF Visit Follow up for chemotherapy tolerability of  ovarian cancer.  HISTORY OF PRESENTING ILLNESS: Katie Hill 70 y.o. female presents for follow up of management of stage IIIC ovarian cancer. She underwent  Neoadjuvant chemotherapy of carbo and taxol x 4  # Patient had debulking surgery on 03/14/2017. She had a laparoscopy with conversion to laparotomy, total hysterectomy, with bilateral salpingo oophorectomy, right aortic lymph node dissection, omentectomy. Pathology showed small foci of residual disease in ovary.   Genetic testing negative for 83 genes on [facility]'s Multi-Cancer panel (ALK, APC, ATM, AXIN2, BAP1, BARD1, BLM, BMPR1A, BRCA1, BRCA2, BRIP1, CASR, CDC73, CDH1, CDK4, CDKN1B, CDKN1C, CDKN2A, CEBPA, CHEK2, CTNNA1, DICER1, DIS3L2, EGFR, EPCAM, FH, FLCN, GATA2, GPC3, GREM1, HOXB13, HRAS, KIT, MAX, MEN1, MET, MITF, MLH1, MSH2, MSH3, MSH6, MUTYH, NBN, NF1, NF2, NTHL1, PALB2, PDGFRA, PHOX2B, PMS2, POLD1, POLE, POT1, PRKAR1A, PTCH1, PTEN, RAD50, RAD51C, RAD51D, RB1, RECQL4, RET, RUNX1, SDHA, SDHAF2, SDHB, SDHC, SDHD, SMAD4, SMARCA4, SMARCB1, SMARCE1, STK11, SUFU, TERC, TERT, TMEM127, TP53, TSC1, TSC2, VHL, WRN, WT1).   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  Myraid testing negative for somatic BRACA1/2, positive for HRD.   # HRD positive, was referred to [facility] for clinical trials of Olarparib maintenance. She opted out.  #  Treatment:  Stage IIIC Ovarian cancer:  #s/p Carboplatin and taxol x 4 neoadjuvant chemotherapy followed by debulking surgery  03/14/2017 # 03/28/2017 S/p Adjuvant carbo and taxol x3   Local recurrence, CA125 one 46.3 03/15/2018-05/17/2018 Carboplatin and Taxol x 4. CA125 decrease from 49.7 to 6 after 4 cycles of treatment.  Started on Olarparib 35m BID on 05/17/2018.  # was seen by Dr. BFransisca Connorsfor further evaluation due to rising Ca1 25 and MRI abdomenPelvis on 11/05/2018 showed Interval progression of, now measuring 2.7 x 2.3 cm and concerning for progression of metastatic disease.retrocaval lymph node in the abdomen Olaparib was held for short period of time. Her CA125 trended down again.  Dr. BFransisca Connorsrecommending resume olaparib and continue monitor. If she has more significant progression of disease she may be a candidate for clinical trial or will be treated with other chemotherapy drugs including Doxil, gemcitabine and Avastin.  #10/06/2020, CT chest abdomen pelvis with contrast showed new lymph nodes in the prevascular space of the upper anterior mediastinum most consistent with metastasis.  Single new right lower lobe pulmonary nodule is concerning for early pulmonary metastasis.  Interval enlargement of the left periaortic retroperitoneal lymph node.  Stable adjacent aorto caval adenopathy.  10/29/2020 MUGA LVEF normal 10/29/2020 Stopped Olarparib  11/03/2020 start Doxil + bevacizumab  INTERVAL HISTORY 70y.o. female with above oncology history reviewed by me today presents for evaluation for chemotherapy for treatment of ovarian cancer  Patient was accompanied by her husband. No new complaints. She has stopped Olarparib as recommended.   . Review of Systems  Constitutional:  Negative for appetite change, chills, fatigue and fever.  HENT:   Negative for hearing loss and voice change.   Eyes:  Negative for eye problems.  Respiratory:  Negative for chest tightness and cough.   Cardiovascular:  Negative for chest pain.  Gastrointestinal:  Negative for  abdominal distention, abdominal pain and blood in stool.   Endocrine: Negative for hot flashes.  Genitourinary:  Negative for difficulty urinating and frequency.   Musculoskeletal:  Negative for arthralgias.  Skin:  Negative for itching and rash.  Neurological:  Negative for extremity weakness.  Hematological:  Negative for adenopathy.  Psychiatric/Behavioral:  Negative for confusion.     [name] MEDICAL HISTORY: Past Medical History:  Diagnosis Date   Dysrhythmia    Genetic testing 03/28/2017   Multi-Cancer panel (83 genes) @ [facility] - No pathogenic mutations detected   High grade ovarian cancer ([place]) 11/20/2016   Pelvic mass in female     SURGICAL HISTORY: Past Surgical History:  Procedure Laterality Date   APPENDECTOMY     LAPAROSCOPY N/A 03/14/2017   Procedure: LAPAROSCOPY OPERATIVE;  Surgeon: Mellody Drown, MD;  Location: [facility];  Service: Gynecology;  Laterality: N/A;   LAPAROTOMY N/A 03/14/2017   Procedure: LAPAROTOMY;  Surgeon: Mellody Drown, MD;  Location: [facility];  Service: Gynecology;  Laterality: N/A;   LYMPH NODE DISSECTION N/A 03/14/2017   Procedure: LYMPH NODE DISSECTION;  Surgeon: Mellody Drown, MD;  Location: [facility];  Service: Gynecology;  Laterality: N/A;   OMENTECTOMY N/A 03/14/2017   Procedure: OMENTECTOMY;  Surgeon: Mellody Drown, MD;  Location: [facility];  Service: Gynecology;  Laterality: N/A;   PORTA CATH INSERTION N/A 11/27/2016   Procedure: Glori Luis Cath Insertion;  Surgeon: Algernon Huxley, MD;  Location: [facility];  Service: Cardiovascular;  Laterality: N/A;    SOCIAL HISTORY: Social History   Tobacco Use   Smoking status: Never   Smokeless tobacco: Never  Vaping Use   Vaping Use: Never used  Substance Use Topics   Alcohol use: Not Currently   Drug use: No     FAMILY HISTORY Family History  Problem Relation Age of Onset   Throat cancer Cousin    Throat cancer Cousin    Leukemia Cousin     ALLERGIES:  is allergic to omeprazole.  MEDICATIONS:  Current Outpatient Medications   Medication Sig Dispense Refill   lidocaine-prilocaine (EMLA) cream Apply 1 application topically as needed. Apply small amount to port site at least 1 hour prior to it being accessed, cover with plastic wrap 30 g 1   vitamin B-12 (CYANOCOBALAMIN) 1000 MCG tablet Take 1 tablet (1,000 mcg total) by mouth daily. 90 tablet 4   olaparib (LYNPARZA) 150 MG tablet TAKE TWO TABLETS BY MOUTH TWICE A DAY (SWALLOW WHOLE. MAY TAKE WITH FOOD TO DECREASE NAUSEA AND VOMITING) (Patient not taking: Reported on 11/03/2020) 120 tablet 2   No current facility-administered medications for this visit.    PHYSICAL EXAMINATION:  ECOG PERFORMANCE STATUS: 0 - Asymptomatic Vitals:   11/03/20 0910  BP: 116/66  Pulse: 61  Resp: 18  Temp: 97.9 F (36.6 C)    Filed Weights   11/03/20 0910  Weight: 121 lb 3.2 oz (55 kg)     Physical Exam Constitutional:      General: She is not in acute distress.    Appearance: She is not diaphoretic.     Comments: Thin built, she walks independantly  HENT:     Head: Normocephalic and atraumatic.     Nose: Nose normal.     Mouth/Throat:     Pharynx: No oropharyngeal exudate.  Eyes:     General: No scleral icterus.    Pupils: Pupils are equal, round, and reactive to light.  Neck:     Vascular: No JVD.  Cardiovascular:  Rate and Rhythm: Normal rate and regular rhythm.     Heart sounds: No murmur heard. Pulmonary:     Effort: Pulmonary effort is normal. No respiratory distress.     Breath sounds: Normal breath sounds. No rales.  Chest:     Chest wall: No tenderness.  Abdominal:     General: Bowel sounds are normal. There is no distension.     Palpations: Abdomen is soft.     Tenderness: There is no abdominal tenderness.  Musculoskeletal:        General: Normal range of motion.     Cervical back: Normal range of motion and neck supple.  Lymphadenopathy:     Cervical: No cervical adenopathy.  Skin:    General: Skin is warm and dry.     Findings: No  erythema.     Comments: Right anterior medi port +   Neurological:     Mental Status: She is alert and oriented to person, place, and time.     Cranial Nerves: No cranial nerve deficit.     Motor: No abnormal muscle tone.     Coordination: Coordination normal.  Psychiatric:        Mood and Affect: Affect normal.        Judgment: Judgment normal.     LABORATORY DATA: I have personally reviewed the data as listed: CBC    Component Value Date/Time   WBC 3.0 (L) 11/03/2020 0850   RBC 2.56 (L) 11/03/2020 0850   HGB 10.4 (L) 11/03/2020 0850   HCT 30.4 (L) 11/03/2020 0850   PLT 146 (L) 11/03/2020 0850   MCV 118.8 (H) 11/03/2020 0850   MCH 40.6 (H) 11/03/2020 0850   MCHC 34.2 11/03/2020 0850   RDW 14.4 11/03/2020 0850   LYMPHSABS 0.8 11/03/2020 0850   MONOABS 0.3 11/03/2020 0850   EOSABS 0.1 11/03/2020 0850   BASOSABS 0.0 11/03/2020 0850   CMP Latest Ref Rng & Units 11/03/2020 10/18/2020 09/08/2020  Glucose 70 - 99 mg/dL 88 94 98  BUN 8 - 23 mg/dL _0 Creatinine 0.44 - 1.00 mg/dL 0.60 0.73 0.69  Sodium 135 - 145 mmol/L 133(L) 133(L) 132(L)  Potassium 3.5 - 5.1 mmol/L 3.9 4.4 4.2  Chloride 98 - 111 mmol/L 105 100 100  CO2 22 - 32 mmol/L _1 Calcium 8.9 - 10.3 mg/dL 9.5 10.2 9.9  Total Protein 6.5 - 8.1 g/dL 6.9 7.5 7.2  Total Bilirubin 0.3 - 1.2 mg/dL 0.5 0.6 0.8  Alkaline Phos 38 - 126 U/L 54 65 66  AST 15 - 41 U/L _2 ALT 0 - 44 U/L _3 Pathology 11/16/2016 Surgical Pathology  CASE: [id number]  PATIENT: Frady Marcoux  Surgical Pathology Report   SPECIMEN SUBMITTED:  A. Retroperitoneal adenopathy, left  DIAGNOSIS:  A. LYMPH NODE, LEFT RETROPERITONEAL; CT-GUIDED CORE BIOPSY:  - METASTATIC HIGH-GRADE SEROUS CARCINOMA.  Pathology 03/14/2017   DIAGNOSIS:  A. OMENTUM; OMENTECTOMY:  - NO TUMOR SEEN.  - ONE NEGATIVE LYMPH NODE (0/1).   B.  RIGHT FALLOPIAN TUBE AND OVARY; SALPINGO-OOPHORECTOMY:  - SMALL FOCI OF HIGH GRADE SEROUS CARCINOMA  INVOLVING THE OVARY.  - MARKED THERAPY RELATED CHANGE.  - NO TUMOR SEEN IN THE FALLOPIAN TUBE.   C.  UTERUS, CERVIX, LEFT FALLOPIAN TUBE AND OVARY; HYSTERECTOMY AND LEFT  SALPINGO-OOPHORECTOMY:  - NABOTHIAN CYSTS.  - CYSTIC ATROPHY OF THE ENDOMETRIUM.  - SEROSAL ADHESIONS.  - UNREMARKABLE FALLOPIAN TUBE.  - OVARY SHOWING  TREATMENT RELATED CHANGE.   D.  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    RADIOGRAPHIC STUDIES: I have personally reviewed the radiological images as listed and agreed with the findings in the report. NM Cardiac Muga Rest  Result Date: 10/29/2020 CLINICAL DATA:  Ovarian cancer, pre cardiotoxic chemotherapy EXAM: NUCLEAR MEDICINE CARDIAC BLOOD POOL IMAGING (MUGA) TECHNIQUE: Cardiac multi-gated acquisition was performed at rest following intravenous injection of Tc-18m labeled red blood cells. RADIOPHARMACEUTICALS:  20.5 mCi Tc-23m pertechnetate in-vitro labeled red blood cells IV COMPARISON:  None FINDINGS: Calculated LEFT ventricular ejection fraction is 66%, normal. Study was obtained at a cardiac rate of 63 bpm. Patient was rhythmic during imaging. Cine analysis of the LEFT ventricle in 3 projections demonstrates normal LEFT ventricular wall motion. IMPRESSION: Normal LEFT ventricular ejection fraction of 66%. Normal LV wall motion. Electronically Signed   By: Lavonia Dana M.D.   On: 10/29/2020 16:27   CT CHEST ABDOMEN PELVIS W CONTRAST  Result Date: 10/07/2020 CLINICAL DATA:  Follow-up malignant ovarian carcinoma. Initial diagnosis 2018. Chemotherapy completed 2018. Recurrence 2019 with chemotherapy. Surgical history of appendectomy a omentectomy and total hysterectomy. EXAM: CT CHEST, ABDOMEN, AND PELVIS WITH CONTRAST TECHNIQUE: Multidetector CT imaging of the chest, abdomen and pelvis was performed following the standard protocol during bolus administration of intravenous contrast. CONTRAST:   78mL OMNIPAQUE IOHEXOL 300 MG/ML  SOLN COMPARISON:  CT 06/30/2020 FINDINGS: CT CHEST FINDINGS Cardiovascular: No significant vascular findings. Normal heart size. No pericardial effusion. Port in the anterior chest wall with tip in distal SVC. Mediastinum/Nodes: New prevascular lymph node in the anterior mediastinum measuring 9 mm (image 23/series 2). Slightly more superior prevascular node measures 9 mm on image 19. High LEFT paratracheal node measures 11 mm on image 14. No supraclavicular adenopathy. Lungs/Pleura: Biapical pleuroparenchymal thickening. Round nodule in the RIGHT lower lobe measuring 4 mm (image 97/3) is new from comparison exam. Musculoskeletal: No aggressive osseous lesion. CT ABDOMEN AND PELVIS FINDINGS Hepatobiliary: No focal hepatic lesion. No biliary ductal dilatation. Gallbladder is normal. Common bile duct is normal. Pancreas: Pancreas is normal. No ductal dilatation. No pancreatic inflammation. Spleen: Normal spleen Adrenals/urinary tract: Adrenal glands and kidneys are normal. The ureters and bladder normal. Stomach/Bowel: Stomach small-bowel cecum normal. Colon and rectosigmoid colon normal. Vascular/Lymphatic: Periaortic retroperitoneal node LEFT aorta at the level of the renal vein measures 16 mm (image 69/2) compared to 11 mm. Elongated lymph node position between the IVC and aorta measures 2.3 x 3.9 cm compared with 2.6 x 3.8 cm for no significant change. No pelvic lymphadenopathy. Reproductive: Post hysterectomy.  Adnexa unremarkable Other: No peritoneal metastasis. Trace free fluid in the pelvis is unchanged (image 110/2) Musculoskeletal: No aggressive osseous lesion. IMPRESSION: Chest Impression: 1. New lymph nodes in the prevascular space of the upper anterior mediastinum most consistent with lymphangitic spread of carcinoma. 2. Single new RIGHT lower lobe pulmonary nodule is concerning for early pulmonary metastasis. Abdomen / Pelvis Impression: 1. Interval enlargement of a LEFT  periaortic retroperitoneal lymph node. Stable adjacent aortocaval adenopathy. 2. No peritoneal metastasis or ascites.  No liver metastasis. Electronically Signed   By: Suzy Bouchard M.D.   On: 10/07/2020 08:38     ASSESSMENT/PLAN Cancer Staging Malignant neoplasm of ovary [facility]) Staging form: Ovary, Fallopian Tube, and Primary Peritoneal Carcinoma, AJCC 8th Edition - Clinical stage from 11/22/2016: Stage IIIC (cT3c, cN1b, cM0) - Signed by Earlie Server, MD on 11/22/2016 Stage used in treatment planning: Yes National  guidelines used in treatment planning: Yes Type of national guideline used in treatment planning: NCCN  1. High grade ovarian cancer ([place])   2. Encounter for antineoplastic chemotherapy   3. Thrombocytopenia ([place])   4. Anemia associated with chemotherapy   : # Ovarian Cancer, local recurrence.  Currently on olaparib 300 mg twice daily. She tolerates well.  CA 125 18.7-->61.2-->111-->90.5-->69--> 54.8--> 29.1-->47-->71.2-->67.7->73.2-->82.8-->70.4-->66.4--> 58.4--> 64.9--> 56.9--> 42.2-->47.7-->54.4-->68.2-> 97-->168 No available clinical trials at [facility] are reviewed and discussed with patient. Proceed with Cycle 1 D1 Doxil and Bevacizumab  Chronic anemia, hemoglobin stable at 10.4  Chemotherapy-induced.  Stable and continue to monitor.   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  I will repeat her B12 level at next viist.  Thrombocytopenia, grade 1, platelet count at 146,000 today.  Monitor.  Port-A-Cath in place  RTF 2 weeks for lab MD bevacizumab.    Earlie Server, MD, PhD Hematology Oncology [facility] Pager- [phone] 11/03/20

## 2020-11-03 NOTE — Patient Instructions (Addendum)
[facility]  Discharge Instructions: Thank you for choosing [facility] to provide your oncology and hematology care.  If you have a lab appointment with the [facility], please go directly to the [facility] and check in at the registration area.  Wear comfortable clothing and clothing appropriate for easy access to any Portacath or PICC line.   We strive to give you quality time with your provider. You may need to reschedule your appointment if you arrive late (15 or more minutes).  Arriving late affects you and other patients whose appointments are after yours.  Also, if you miss three or more appointments without notifying the office, you may be dismissed from the clinic at the provider's discretion.      For prescription refill requests, have your pharmacy contact our office and allow 72 hours for refills to be completed.    Today you received the following chemotherapy and/or immunotherapy agents : Avastin / Doxil  Bevacizumab injection What is this medication? BEVACIZUMAB (be va SIZ yoo mab) is a monoclonal antibody. It is used to treatmany types of cancer. This medicine may be used for other purposes; ask your health care provider orpharmacist if you have questions. COMMON BRAND NAME(S): Avastin, MVASI, Noah Charon What should I tell my care team before I take this medication? They need to know if you have any of these conditions: diabetes heart disease high blood pressure history of coughing up blood prior anthracycline chemotherapy (e.g., doxorubicin, daunorubicin, epirubicin) recent or ongoing radiation therapy recent or planning to have surgery stroke an unusual or allergic reaction to bevacizumab, hamster proteins, mouse proteins, other medicines, foods, dyes, or preservatives pregnant or trying to get pregnant breast-feeding How should I use this medication? This medicine is for infusion into a vein. It is given by a  health careprofessional in a hospital or clinic setting. Talk to your pediatrician regarding the use of this medicine in children.Special care may be needed. Overdosage: If you think you have taken too much of this medicine contact apoison control center or emergency room at once. NOTE: This medicine is only for you. Do not share this medicine with others. What if I miss a dose? It is important not to miss your dose. Call your doctor or health careprofessional if you are unable to keep an appointment. What may interact with this medication? Interactions are not expected. This list may not describe all possible interactions. Give your health care provider a list of all the medicines, herbs, non-prescription drugs, or dietary supplements you use. Also tell them if you smoke, drink alcohol, or use illegaldrugs. Some items may interact with your medicine. What should I watch for while using this medication? Your condition will be monitored carefully while you are receiving this medicine. You will need important blood work and urine testing done while youare taking this medicine. This medicine may increase your risk to bruise or bleed. Call your doctor orhealth care professional if you notice any unusual bleeding. Before having surgery, talk to your health care provider to make sure it is ok. This drug can increase the risk of poor healing of your surgical site or wound. You will need to stop this drug for 28 days before surgery. After surgery, wait at least 28 days before restarting this drug. Make sure the surgical site or wound is healed enough before restarting this drug. Talk to your health careprovider if questions. Do not become pregnant while taking this medicine or for 6  months after stopping it. Women should inform their doctor if they wish to become pregnant or think they might be pregnant. There is a potential for serious side effects to an unborn child. Talk to your health care professional or  pharmacist for more information. Do not breast-feed an infant while taking this medicine andfor 6 months after the last dose. This medicine has caused ovarian failure in some women. This medicine may interfere with the ability to have a child. You should talk to your doctor orhealth care professional if you are concerned about your fertility. What side effects may I notice from receiving this medication? Side effects that you should report to your doctor or health care professionalas soon as possible: allergic reactions like skin rash, itching or hives, swelling of the face, lips, or tongue chest pain or chest tightness chills coughing up blood high fever seizures severe constipation signs and symptoms of bleeding such as bloody or black, tarry stools; red or dark-brown urine; spitting up blood or brown material that looks like coffee grounds; red spots on the skin; unusual bruising or bleeding from the eye, gums, or nose signs and symptoms of a blood clot such as breathing problems; chest pain; severe, sudden headache; pain, swelling, warmth in the leg signs and symptoms of a stroke like changes in vision; confusion; trouble speaking or understanding; severe headaches; sudden numbness or weakness of the face, arm or leg; trouble walking; dizziness; loss of balance or coordination stomach pain sweating swelling of legs or ankles vomiting weight gain Side effects that usually do not require medical attention (report to yourdoctor or health care professional if they continue or are bothersome): back pain changes in taste decreased appetite dry skin nausea tiredness This list may not describe all possible side effects. Call your doctor for medical advice about side effects. You may report side effects to FDA at1-800-FDA-1088. Where should I keep my medication? This drug is given in a hospital or clinic and will not be stored at home. NOTE: This sheet is a summary. It may not cover all  possible information. If you have questions about this medicine, talk to your doctor, pharmacist, orhealth care provider.  2022 Elsevier/Gold Standard (2019-01-22 10:50:46)   Doxorubicin Liposomal injection What is this medication? LIPOSOMAL DOXORUBICIN (LIP oh som al dox oh ROO bi sin) is a chemotherapy drug. This medicine is used to treat many kinds of cancer like Kaposi's sarcoma,multiple myeloma, and ovarian cancer. This medicine may be used for other purposes; ask your health care provider orpharmacist if you have questions. COMMON BRAND NAME(S): Doxil, Lipodox What should I tell my care team before I take this medication? They need to know if you have any of these conditions: blood disorders heart disease infection (especially a virus infection such as chickenpox, cold sores, or herpes) liver disease recent or ongoing radiation therapy an unusual or allergic reaction to doxorubicin, other chemotherapy agents, soybeans, other medicines, foods, dyes, or preservatives pregnant or trying to get pregnant breast-feeding How should I use this medication? This drug is given as an infusion into a vein. It is administered in a hospital or clinic by a specially trained health care professional. If you have pain, swelling, burning or any unusual feeling around the site of your injection,tell your health care professional right away. Talk to your pediatrician regarding the use of this medicine in children.Special care may be needed. Overdosage: If you think you have taken too much of this medicine contact apoison control center or emergency room  at once. NOTE: This medicine is only for you. Do not share this medicine with others. What if I miss a dose? It is important not to miss your dose. Call your doctor or health careprofessional if you are unable to keep an appointment. What may interact with this medication? Do not take this medicine with any of the following medications: zidovudine This  medicine may also interact with the following medications: medicines to increase blood counts like filgrastim, pegfilgrastim, sargramostim vaccines Talk to your doctor or health care professional before taking any of thesemedicines: acetaminophen aspirin ibuprofen ketoprofen naproxen This list may not describe all possible interactions. Give your health care provider a list of all the medicines, herbs, non-prescription drugs, or dietary supplements you use. Also tell them if you smoke, drink alcohol, or use illegaldrugs. Some items may interact with your medicine. What should I watch for while using this medication? Your condition will be monitored carefully while you are receiving thismedicine. You may need blood work done while you are taking this medicine. This drug may make you feel generally unwell. This is not uncommon, as chemotherapy can affect healthy cells as well as cancer cells. Report any side effects. Continue your course of treatment even though you feel ill unless yourdoctor tells you to stop. Your urine may turn orange-red for a few days after your dose. This is notblood. If your urine is dark or brown, call your doctor. In some cases, you may be given additional medicines to help with side effects.Follow all directions for their use. Talk to your doctor about your risk of cancer. You may be more at risk forcertain types of cancers if you take this medicine. Do not become pregnant while taking this medicine or for 6 months after stopping it. Women should inform their healthcare professional if they wish to become pregnant or think they may be pregnant. Men should not father a child while taking this medicine and for 6 months after stopping it. There is a potential for serious side effects to an unborn child. Talk to your health care professional or pharmacist for more information. Do not breast-feed an infantwhile taking this medicine. This medicine has caused ovarian failure in  some women. This medicine may make it more difficult to get pregnant. Talk to your healthcare professional if [place] concerned about your fertility. This medicine has caused decreased sperm counts in some men. This may make it more difficult to father a child. Talk to your healthcare professional if [place] concerned about your fertility. This medicine may cause a decrease in Co-Enzyme Q-10. You should make sure that you get enough Co-Enzyme Q-10 while you are taking this medicine. Discuss thefoods you eat and the vitamins you take with your health care professional. What side effects may I notice from receiving this medication? Side effects that you should report to your doctor or health care professionalas soon as possible: allergic reactions like skin rash, itching or hives, swelling of the face, lips, or tongue low blood counts - this medicine may decrease the number of white blood cells, red blood cells and platelets. You may be at increased risk for infections and bleeding. signs of hand-foot syndrome - tingling or burning, redness, flaking, swelling, small blisters, or small sores on the palms of your hands or the soles of your feet signs of infection - fever or chills, cough, sore throat, pain or difficulty passing urine signs of decreased platelets or bleeding - bruising, pinpoint red spots on the skin, black, tarry stools,  blood in the urine signs of decreased red blood cells - unusually weak or tired, fainting spells, lightheadedness back pain, chills, facial flushing, fever, headache, tightness in the chest or throat during the infusion breathing problems chest pain fast, irregular heartbeat mouth pain, redness, sores pain, swelling, redness at site where injected pain, tingling, numbness in the hands or feet swelling of ankles, feet, or hands vomiting Side effects that usually do not require medical attention (report to yourdoctor or health care professional if they continue or are  bothersome): diarrhea hair loss loss of appetite nail discoloration or damage nausea red or watery eyes red colored urine stomach upset This list may not describe all possible side effects. Call your doctor for medical advice about side effects. You may report side effects to FDA at1-800-FDA-1088. Where should I keep my medication? This drug is given in a hospital or clinic and will not be stored at home. NOTE: This sheet is a summary. It may not cover all possible information. If you have questions about this medicine, talk to your doctor, pharmacist, orhealth care provider.  2022 Elsevier/Gold Standard (2017-12-03 15:13:26)     To help prevent nausea and vomiting after your treatment, we encourage you to take your nausea medication as directed.  BELOW ARE SYMPTOMS THAT SHOULD BE REPORTED IMMEDIATELY: *FEVER GREATER THAN 100.4 F (38 C) OR HIGHER *CHILLS OR SWEATING *NAUSEA AND VOMITING THAT IS NOT CONTROLLED WITH YOUR NAUSEA MEDICATION *UNUSUAL SHORTNESS OF BREATH *UNUSUAL BRUISING OR BLEEDING *URINARY PROBLEMS (pain or burning when urinating, or frequent urination) *BOWEL PROBLEMS (unusual diarrhea, constipation, pain near the anus) TENDERNESS IN MOUTH AND THROAT WITH OR WITHOUT PRESENCE OF ULCERS (sore throat, sores in mouth, or a toothache) UNUSUAL RASH, SWELLING OR PAIN  UNUSUAL VAGINAL DISCHARGE OR ITCHING   Items with * indicate a potential emergency and should be followed up as soon as possible or go to the Emergency Department if any problems should occur.  Please show the CHEMOTHERAPY ALERT CARD or IMMUNOTHERAPY ALERT CARD at check-in to the Emergency Department and triage nurse.  Should you have questions after your visit or need to cancel or reschedule your appointment, please contact [facility]  [phone] and follow the prompts.  Office hours are 8:00 a.m. to 4:30 p.m. Monday - Friday. Please note that voicemails left after  4:00 p.m. may not be returned until the following business day.  We are closed weekends and major holidays. You have access to a nurse at all times for urgent questions. Please call the main number to the clinic [phone] and follow the prompts.  For any non-urgent questions, you may also contact your provider using MyChart. We now offer e-Visits for anyone 62 and older to request care online for non-urgent symptoms. For details visit [email or web address].   Also download the MyChart app! Go to the app store, search "MyChart", open the app, select [facility], and log in with your MyChart username and password.  Due to Covid, a mask is required upon entering the hospital/clinic. If you do not have a mask, one will be given to you upon arrival. For doctor visits, patients may have 1 support person aged 46 or older with them. For treatment visits, patients cannot have anyone with them due to current Covid guidelines and our immunocompromised population.

## 2020-11-03 NOTE — Progress Notes (Signed)
Patient here to start new tx. No new concerns voiced.

## 2020-11-04 LAB — CA 125: Cancer Antigen (CA) 125: 155 U/mL — ABNORMAL HIGH (ref 0.0–38.1)

## 2020-11-17 ENCOUNTER — Telehealth: Payer: Self-pay | Admitting: *Deleted

## 2020-11-17 ENCOUNTER — Inpatient Hospital Stay: Payer: Medicare Other | Attending: Oncology

## 2020-11-17 ENCOUNTER — Inpatient Hospital Stay (HOSPITAL_BASED_OUTPATIENT_CLINIC_OR_DEPARTMENT_OTHER): Payer: Medicare Other | Admitting: Oncology

## 2020-11-17 ENCOUNTER — Encounter: Payer: Self-pay | Admitting: Oncology

## 2020-11-17 ENCOUNTER — Inpatient Hospital Stay: Payer: Medicare Other

## 2020-11-17 VITALS — BP 109/62 | HR 68 | Temp 98.8°F | Resp 18 | Wt 120.7 lb

## 2020-11-17 DIAGNOSIS — C569 Malignant neoplasm of unspecified ovary: Secondary | ICD-10-CM | POA: Diagnosis not present

## 2020-11-17 DIAGNOSIS — Z452 Encounter for adjustment and management of vascular access device: Secondary | ICD-10-CM | POA: Insufficient documentation

## 2020-11-17 DIAGNOSIS — D6481 Anemia due to antineoplastic chemotherapy: Secondary | ICD-10-CM

## 2020-11-17 DIAGNOSIS — D701 Agranulocytosis secondary to cancer chemotherapy: Secondary | ICD-10-CM | POA: Diagnosis not present

## 2020-11-17 DIAGNOSIS — D6959 Other secondary thrombocytopenia: Secondary | ICD-10-CM | POA: Insufficient documentation

## 2020-11-17 DIAGNOSIS — R918 Other nonspecific abnormal finding of lung field: Secondary | ICD-10-CM | POA: Diagnosis not present

## 2020-11-17 DIAGNOSIS — R21 Rash and other nonspecific skin eruption: Secondary | ICD-10-CM | POA: Insufficient documentation

## 2020-11-17 DIAGNOSIS — Z5112 Encounter for antineoplastic immunotherapy: Secondary | ICD-10-CM | POA: Insufficient documentation

## 2020-11-17 DIAGNOSIS — D696 Thrombocytopenia, unspecified: Secondary | ICD-10-CM

## 2020-11-17 DIAGNOSIS — T451X5A Adverse effect of antineoplastic and immunosuppressive drugs, initial encounter: Secondary | ICD-10-CM | POA: Diagnosis not present

## 2020-11-17 DIAGNOSIS — Z5111 Encounter for antineoplastic chemotherapy: Secondary | ICD-10-CM

## 2020-11-17 LAB — COMPREHENSIVE METABOLIC PANEL
ALT: 8 U/L (ref 0–44)
AST: 16 U/L (ref 15–41)
Albumin: 4.2 g/dL (ref 3.5–5.0)
Alkaline Phosphatase: 63 U/L (ref 38–126)
Anion gap: 5 (ref 5–15)
BUN: 13 mg/dL (ref 8–23)
CO2: 25 mmol/L (ref 22–32)
Calcium: 9.6 mg/dL (ref 8.9–10.3)
Chloride: 102 mmol/L (ref 98–111)
Creatinine, Ser: 0.52 mg/dL (ref 0.44–1.00)
GFR, Estimated: >60 mL/min (ref >60)
Glucose, Bld: 86 mg/dL (ref 70–99)
Potassium: 3.9 mmol/L (ref 3.5–5.1)
Sodium: 132 mmol/L — ABNORMAL LOW (ref 135–145)
Total Bilirubin: 0.9 mg/dL (ref 0.3–1.2)
Total Protein: 7 g/dL (ref 6.5–8.1)

## 2020-11-17 LAB — CBC WITH DIFFERENTIAL/PLATELET
Abs Immature Granulocytes: 0.02 10*3/uL (ref 0.00–0.07)
Basophils Absolute: 0 10*3/uL (ref 0.0–0.1)
Basophils Relative: 1 %
Eosinophils Absolute: 0 10*3/uL (ref 0.0–0.5)
Eosinophils Relative: 2 %
HCT: 29.8 % — ABNORMAL LOW (ref 36.0–46.0)
Hemoglobin: 10.5 g/dL — ABNORMAL LOW (ref 12.0–15.0)
Immature Granulocytes: 1 %
Lymphocytes Relative: 35 %
Lymphs Abs: 0.8 10*3/uL (ref 0.7–4.0)
MCH: 40.5 pg — ABNORMAL HIGH (ref 26.0–34.0)
MCHC: 35.2 g/dL (ref 30.0–36.0)
MCV: 115.1 fL — ABNORMAL HIGH (ref 80.0–100.0)
Monocytes Absolute: 0.1 10*3/uL (ref 0.1–1.0)
Monocytes Relative: 4 %
Neutro Abs: 1.3 10*3/uL — ABNORMAL LOW (ref 1.7–7.7)
Neutrophils Relative %: 57 %
Platelets: 103 10*3/uL — ABNORMAL LOW (ref 150–400)
RBC: 2.59 MIL/uL — ABNORMAL LOW (ref 3.87–5.11)
RDW: 13.2 % (ref 11.5–15.5)
Smear Review: DECREASED
WBC: 2.3 10*3/uL — ABNORMAL LOW (ref 4.0–10.5)
nRBC: 0 % (ref 0.0–0.2)

## 2020-11-17 LAB — PROTEIN, URINE, RANDOM: Total Protein, Urine: <6 mg/dL

## 2020-11-17 MED ORDER — SODIUM CHLORIDE 0.9 % IV SOLN
10.0000 mg/kg | Freq: Once | INTRAVENOUS | Status: AC
  Administered 2020-11-17: 500 mg via INTRAVENOUS
  Filled 2020-11-17: qty 16

## 2020-11-17 MED ORDER — HEPARIN SOD (PORK) LOCK FLUSH 100 UNIT/ML IV SOLN
500.0000 [IU] | Freq: Once | INTRAVENOUS | Status: AC | PRN
  Administered 2020-11-17: 500 [IU]
  Filled 2020-11-17: qty 5

## 2020-11-17 MED ORDER — SODIUM CHLORIDE 0.9 % IV SOLN
Freq: Once | INTRAVENOUS | Status: AC
Start: 2020-11-17 — End: 2020-11-17
  Filled 2020-11-17: qty 250

## 2020-11-17 MED ORDER — HYDROCORTISONE 0.5 % EX CREA: 1.0000 "application " | TOPICAL_CREAM | Freq: Three times a day (TID) | CUTANEOUS | 0 refills | Status: DC

## 2020-11-17 MED ORDER — HEPARIN SOD (PORK) LOCK FLUSH 100 UNIT/ML IV SOLN
INTRAVENOUS | Status: AC
  Filled 2020-11-17: qty 5

## 2020-11-17 NOTE — Telephone Encounter (Signed)
Patient requesting hydrocortisone script to be sent to [facility].   Requested resubmitted. Script previously went to [facility]

## 2020-11-17 NOTE — Progress Notes (Signed)
Katie Hill Follow up visit  Patient Care Team: Patient, No Pcp Per (Inactive) as PCP - General (General Practice) Clent Jacks, [name], [name], [name], MD as Referring Physician (Obstetrics and Gynecology)  CHIEF COMPLAINTS/PURPOSE OF Visit Follow up for chemotherapy tolerability of  ovarian Hill.  HISTORY OF PRESENTING ILLNESS: Katie Hill 70 y.o. female presents for follow up of management of stage IIIC ovarian Hill. She underwent  Neoadjuvant chemotherapy of carbo and taxol x 4  # Patient had debulking surgery on 03/14/2017. She had a laparoscopy with conversion to laparotomy, total hysterectomy, with bilateral salpingo oophorectomy, right aortic lymph node dissection, omentectomy. Pathology showed small foci of residual disease in ovary.   Genetic testing negative for 83 genes on [facility]'s Multi-Hill panel (ALK, APC, ATM, AXIN2, BAP1, BARD1, BLM, BMPR1A, BRCA1, BRCA2, BRIP1, CASR, CDC73, CDH1, CDK4, CDKN1B, CDKN1C, CDKN2A, CEBPA, CHEK2, CTNNA1, DICER1, DIS3L2, EGFR, EPCAM, FH, FLCN, GATA2, GPC3, GREM1, HOXB13, HRAS, KIT, MAX, MEN1, MET, MITF, MLH1, MSH2, MSH3, MSH6, MUTYH, NBN, NF1, NF2, NTHL1, PALB2, PDGFRA, PHOX2B, PMS2, POLD1, POLE, POT1, PRKAR1A, PTCH1, PTEN, RAD50, RAD51C, RAD51D, RB1, RECQL4, RET, RUNX1, SDHA, SDHAF2, SDHB, SDHC, SDHD, SMAD4, SMARCA4, SMARCB1, SMARCE1, STK11, SUFU, TERC, TERT, TMEM127, TP53, TSC1, TSC2, VHL, WRN, WT1).   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  Myraid testing negative for somatic BRACA1/2, positive for HRD.   # HRD positive, was referred to [facility] for clinical trials of Olarparib maintenance. She opted out.  #  Treatment:  Stage IIIC Ovarian Hill:  #s/p Carboplatin and taxol x 4 neoadjuvant chemotherapy followed by debulking  surgery 03/14/2017 # 03/28/2017 S/p Adjuvant carbo and taxol x3   Local recurrence, CA125 one 46.3 03/15/2018-05/17/2018 Carboplatin and Taxol x 4. CA125 decrease from 49.7 to 6 after 4 cycles of treatment.  Started on Olarparib $RemoveBefo'300mg'YoPdtgwmeyU$  BID on 05/17/2018.  # was seen by Dr. Fransisca Connors for further evaluation due to rising Ca1 25 and MRI abdomenPelvis on 11/05/2018 showed Interval progression of, now measuring 2.7 x 2.3 cm and concerning for progression of metastatic disease.retrocaval lymph node in the abdomen Olaparib was held for short period of time. Her CA125 trended down again.  Dr. Fransisca Connors recommending resume olaparib and continue monitor. If she has more significant progression of disease she may be a candidate for clinical trial or will be treated with other chemotherapy drugs including Doxil, gemcitabine and Avastin.  #10/06/2020, CT chest abdomen pelvis with contrast showed new lymph nodes in the prevascular space of the upper anterior mediastinum most consistent with metastasis.  Single new right lower lobe pulmonary nodule is concerning for early pulmonary metastasis.  Interval enlargement of the left periaortic retroperitoneal lymph node.  Stable adjacent aorto caval adenopathy.  10/29/2020 MUGA LVEF normal 10/29/2020 Stopped Olarparib  11/03/2020 start Doxil + bevacizumab  INTERVAL HISTORY 70 y.o. female with above oncology history reviewed by me today presents for evaluation for chemotherapy for treatment of ovarian Hill  Patient was accompanied by her husband. Status post cycle 1 day 1 Doxil/bevacizumab.  Overall she tolerates well.  No nausea vomiting diarrhea.  She has developed itchy skin rash on her back.  She has taken Benadryl which relieves the itchiness.  . Review of Systems  Constitutional:  Negative for appetite change, chills, fatigue and fever.  HENT:   Negative for hearing loss and voice change.   Eyes:  Negative for eye problems.  Respiratory:  Negative for chest tightness  and cough.   Cardiovascular:  Negative for chest pain.  Gastrointestinal:  Negative for abdominal distention, abdominal pain and blood in stool.  Endocrine: Negative for hot flashes.  Genitourinary:  Negative for difficulty urinating and frequency.   Musculoskeletal:  Negative for arthralgias.  Skin:  Positive for rash. Negative for itching.  Neurological:  Negative for extremity weakness.  Hematological:  Negative for adenopathy.  Psychiatric/Behavioral:  Negative for confusion.     [name] MEDICAL HISTORY: Past Medical History:  Diagnosis Date   Dysrhythmia    Genetic testing 03/28/2017   Multi-Hill panel (83 genes) @ [facility] - No pathogenic mutations detected   High grade ovarian Hill ([place]) 11/20/2016   Pelvic mass in female     SURGICAL HISTORY: Past Surgical History:  Procedure Laterality Date   APPENDECTOMY     LAPAROSCOPY N/A 03/14/2017   Procedure: LAPAROSCOPY OPERATIVE;  Surgeon: Mellody Drown, MD;  Location: [facility];  Service: Gynecology;  Laterality: N/A;   LAPAROTOMY N/A 03/14/2017   Procedure: LAPAROTOMY;  Surgeon: Mellody Drown, MD;  Location: [facility];  Service: Gynecology;  Laterality: N/A;   LYMPH NODE DISSECTION N/A 03/14/2017   Procedure: LYMPH NODE DISSECTION;  Surgeon: Mellody Drown, MD;  Location: [facility];  Service: Gynecology;  Laterality: N/A;   OMENTECTOMY N/A 03/14/2017   Procedure: OMENTECTOMY;  Surgeon: Mellody Drown, MD;  Location: [facility];  Service: Gynecology;  Laterality: N/A;   PORTA CATH INSERTION N/A 11/27/2016   Procedure: Glori Luis Cath Insertion;  Surgeon: Algernon Huxley, MD;  Location: [facility];  Service: Cardiovascular;  Laterality: N/A;    SOCIAL HISTORY: Social History   Tobacco Use   Smoking status: Never   Smokeless tobacco: Never  Vaping Use   Vaping Use: Never used  Substance Use Topics   Alcohol use: Not Currently   Drug use: No     FAMILY HISTORY Family History  Problem Relation Age of Onset   Throat  Hill Cousin    Throat Hill Cousin    Leukemia Cousin     ALLERGIES:  is allergic to omeprazole.  MEDICATIONS:  Current Outpatient Medications  Medication Sig Dispense Refill   hydrocortisone cream 0.5 % Apply 1 application topically 3 (three) times daily. 30 g 0   lidocaine-prilocaine (EMLA) cream Apply 1 application topically as needed. Apply small amount to port site at least 1 hour prior to it being accessed, cover with plastic wrap 30 g 1   vitamin B-12 (CYANOCOBALAMIN) 1000 MCG tablet Take 1 tablet (1,000 mcg total) by mouth daily. 90 tablet 4   olaparib (LYNPARZA) 150 MG tablet TAKE TWO TABLETS BY MOUTH TWICE A DAY (SWALLOW WHOLE. MAY TAKE WITH FOOD TO DECREASE NAUSEA AND VOMITING) (Patient not taking: No sig reported) 120 tablet 2   No current facility-administered medications for this visit.    PHYSICAL EXAMINATION:  ECOG PERFORMANCE STATUS: 0 - Asymptomatic Vitals:   11/17/20 0903  BP: 109/62  Pulse: 68  Resp: 18  Temp: 98.8 F (37.1 C)    Filed Weights   11/17/20 0903  Weight: 120 lb 11.2 oz (54.7 kg)     Physical Exam Constitutional:      General: She is not in acute distress.    Appearance: She is not diaphoretic.     Comments: Thin built, she walks independantly  HENT:     Head: Normocephalic and atraumatic.     Nose: Nose normal.  Mouth/Throat:     Pharynx: No oropharyngeal exudate.  Eyes:     General: No scleral icterus.    Pupils: Pupils are equal, round, and reactive to light.  Neck:     Vascular: No JVD.  Cardiovascular:     Rate and Rhythm: Normal rate and regular rhythm.     Heart sounds: No murmur heard. Pulmonary:     Effort: Pulmonary effort is normal. No respiratory distress.     Breath sounds: Normal breath sounds. No rales.  Chest:     Chest wall: No tenderness.  Abdominal:     General: Bowel sounds are normal. There is no distension.     Palpations: Abdomen is soft.     Tenderness: There is no abdominal tenderness.   Musculoskeletal:        General: Normal range of motion.     Cervical back: Normal range of motion and neck supple.  Lymphadenopathy:     Cervical: No cervical adenopathy.  Skin:    General: Skin is warm and dry.     Findings: Rash present. No erythema.     Comments: Right anterior medi port +   Neurological:     Mental Status: She is alert and oriented to person, place, and time.     Cranial Nerves: No cranial nerve deficit.     Motor: No abnormal muscle tone.     Coordination: Coordination normal.  Psychiatric:        Mood and Affect: Affect normal.        Judgment: Judgment normal.     11/17/20   LABORATORY DATA: I have personally reviewed the data as listed: CBC    Component Value Date/Time   WBC 2.3 (L) 11/17/2020 0831   RBC 2.59 (L) 11/17/2020 0831   HGB 10.5 (L) 11/17/2020 0831   HCT 29.8 (L) 11/17/2020 0831   PLT 103 (L) 11/17/2020 0831   MCV 115.1 (H) 11/17/2020 0831   MCH 40.5 (H) 11/17/2020 0831   MCHC 35.2 11/17/2020 0831   RDW 13.2 11/17/2020 0831   LYMPHSABS 0.8 11/17/2020 0831   MONOABS 0.1 11/17/2020 0831   EOSABS 0.0 11/17/2020 0831   BASOSABS 0.0 11/17/2020 0831   CMP Latest Ref Rng & Units 11/17/2020 11/03/2020 10/18/2020  Glucose 70 - 99 mg/dL 86 88 94  BUN 8 - 23 mg/dL _0 Creatinine 0.44 - 1.00 mg/dL 0.52 0.60 0.73  Sodium 135 - 145 mmol/L 132(L) 133(L) 133(L)  Potassium 3.5 - 5.1 mmol/L 3.9 3.9 4.4  Chloride 98 - 111 mmol/L 102 105 100  CO2 22 - 32 mmol/L _1 Calcium 8.9 - 10.3 mg/dL 9.6 9.5 10.2  Total Protein 6.5 - 8.1 g/dL 7.0 6.9 7.5  Total Bilirubin 0.3 - 1.2 mg/dL 0.9 0.5 0.6  Alkaline Phos 38 - 126 U/L 63 54 65  AST 15 - 41 U/L _2 ALT 0 - 44 U/L _3 Pathology 11/16/2016 Surgical Pathology  CASE: [id number]  PATIENT: Nasirah Hodgman  Surgical Pathology Report   SPECIMEN SUBMITTED:  A. Retroperitoneal adenopathy, left  DIAGNOSIS:  A. LYMPH NODE, LEFT RETROPERITONEAL; CT-GUIDED CORE BIOPSY:  -  METASTATIC HIGH-GRADE SEROUS CARCINOMA.  Pathology 03/14/2017   DIAGNOSIS:  A. OMENTUM; OMENTECTOMY:  - NO TUMOR SEEN.  - ONE NEGATIVE LYMPH NODE (0/1).   B.  RIGHT FALLOPIAN TUBE AND OVARY; SALPINGO-OOPHORECTOMY:  - SMALL FOCI OF HIGH GRADE SEROUS CARCINOMA INVOLVING THE OVARY.  - MARKED THERAPY  RELATED CHANGE.  - NO TUMOR SEEN IN THE FALLOPIAN TUBE.   C.  UTERUS, CERVIX, LEFT FALLOPIAN TUBE AND OVARY; HYSTERECTOMY AND LEFT  SALPINGO-OOPHORECTOMY:  - NABOTHIAN CYSTS.  - CYSTIC ATROPHY OF THE ENDOMETRIUM.  - SEROSAL ADHESIONS.  - UNREMARKABLE FALLOPIAN TUBE.  - OVARY SHOWING TREATMENT RELATED CHANGE.   D.  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    RADIOGRAPHIC STUDIES: I have personally reviewed the radiological images as listed and agreed with the findings in the report. NM Cardiac [facility] Rest  Result Date: 10/29/2020 CLINICAL DATA:  Ovarian Hill, pre cardiotoxic chemotherapy EXAM: NUCLEAR MEDICINE CARDIAC BLOOD POOL IMAGING (MUGA) TECHNIQUE: Cardiac multi-gated acquisition was performed at rest following intravenous injection of Tc-29m labeled red blood cells. RADIOPHARMACEUTICALS:  20.5 mCi Tc-51m pertechnetate in-vitro labeled red blood cells IV COMPARISON:  None FINDINGS: Calculated LEFT ventricular ejection fraction is 66%, normal. Study was obtained at a cardiac rate of 63 bpm. Patient was rhythmic during imaging. Cine analysis of the LEFT ventricle in 3 projections demonstrates normal LEFT ventricular wall motion. IMPRESSION: Normal LEFT ventricular ejection fraction of 66%. Normal LV wall motion. Electronically Signed   By: Lavonia Dana M.D.   On: 10/29/2020 16:27   CT CHEST ABDOMEN PELVIS W CONTRAST  Result Date: 10/07/2020 CLINICAL DATA:  Follow-up malignant ovarian carcinoma. Initial diagnosis 2018. Chemotherapy completed 2018. Recurrence 2019 with chemotherapy. Surgical history of appendectomy a  omentectomy and total hysterectomy. EXAM: CT CHEST, ABDOMEN, AND PELVIS WITH CONTRAST TECHNIQUE: Multidetector CT imaging of the chest, abdomen and pelvis was performed following the standard protocol during bolus administration of intravenous contrast. CONTRAST:  54mL OMNIPAQUE IOHEXOL 300 MG/ML  SOLN COMPARISON:  CT 06/30/2020 FINDINGS: CT CHEST FINDINGS Cardiovascular: No significant vascular findings. Normal heart size. No pericardial effusion. Port in the anterior chest wall with tip in distal SVC. Mediastinum/Nodes: New prevascular lymph node in the anterior mediastinum measuring 9 mm (image 23/series 2). Slightly more superior prevascular node measures 9 mm on image 19. High LEFT paratracheal node measures 11 mm on image 14. No supraclavicular adenopathy. Lungs/Pleura: Biapical pleuroparenchymal thickening. Round nodule in the RIGHT lower lobe measuring 4 mm (image 97/3) is new from comparison exam. Musculoskeletal: No aggressive osseous lesion. CT ABDOMEN AND PELVIS FINDINGS Hepatobiliary: No focal hepatic lesion. No biliary ductal dilatation. Gallbladder is normal. Common bile duct is normal. Pancreas: Pancreas is normal. No ductal dilatation. No pancreatic inflammation. Spleen: Normal spleen Adrenals/urinary tract: Adrenal glands and kidneys are normal. The ureters and bladder normal. Stomach/Bowel: Stomach small-bowel cecum normal. Colon and rectosigmoid colon normal. Vascular/Lymphatic: Periaortic retroperitoneal node LEFT aorta at the level of the renal vein measures 16 mm (image 69/2) compared to 11 mm. Elongated lymph node position between the IVC and aorta measures 2.3 x 3.9 cm compared with 2.6 x 3.8 cm for no significant change. No pelvic lymphadenopathy. Reproductive: Post hysterectomy.  Adnexa unremarkable Other: No peritoneal metastasis. Trace free fluid in the pelvis is unchanged (image 110/2) Musculoskeletal: No aggressive osseous lesion. IMPRESSION: Chest Impression: 1. New lymph nodes in the  prevascular space of the upper anterior mediastinum most consistent with lymphangitic spread of carcinoma. 2. Single new RIGHT lower lobe pulmonary nodule is concerning for early pulmonary metastasis. Abdomen / Pelvis Impression: 1. Interval enlargement of a LEFT periaortic retroperitoneal lymph node. Stable adjacent aortocaval adenopathy. 2. No peritoneal metastasis or ascites.  No liver metastasis. Electronically Signed   By: Suzy Bouchard M.D.   On:  10/07/2020 08:38     ASSESSMENT/PLAN Hill Staging Malignant neoplasm of ovary [facility]) Staging form: Ovary, Fallopian Tube, and Primary Peritoneal Carcinoma, AJCC 8th Edition - Clinical stage from 11/22/2016: Stage IIIC (cT3c, cN1b, cM0) - Signed by Earlie Server, MD on 11/22/2016 Stage used in treatment planning: Yes National guidelines used in treatment planning: Yes Type of national guideline used in treatment planning: NCCN  1. High grade ovarian Hill ([place])   2. Encounter for antineoplastic chemotherapy   3. Thrombocytopenia ([place])   4. Anemia associated with chemotherapy   5. Chemotherapy induced neutropenia ([facility])   6. Skin rash   : # Ovarian Hill, local recurrence.  Currently on olaparib 300 mg twice daily. She tolerates well.  CA 125 18.7-->61.2-->111-->90.5-->69--> 54.8--> 29.1-->47-->71.2-->67.7->73.2-->82.8-->70.4-->66.4--> 58.4--> 64.9--> 56.9--> 42.2-->47.7-->54.4-->68.2-> 97-->168 No available clinical trials at [facility] are reviewed and discussed with patient. Proceed with Cycle 1 D15 Bevacizumab  Chronic anemia, hemoglobin stable at 10.4  Chemotherapy-induced.  Stable and continue to monitor.   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  B12 is pending.  # Chemotherapy induced neutropenia, mild, ANC 1.3. monitor  Thrombocytopenia- chemotherapy induced. platelet count at 103,000 today.  Monitor.  # Skin Rash, topical steroid cream. Rx sent  Port-A-Cath in place  RTF 2 weeks for lab MD Doxil/ bevacizumab.    Earlie Server, MD, PhD Hematology Oncology [facility] at [facility] Pager- [phone] 11/17/20

## 2020-11-17 NOTE — Patient Instructions (Signed)
[facility] [place] [facility]  Discharge Instructions: Thank you for choosing [facility] to provide your oncology and hematology care.  If you have a lab appointment with the Cancer Center, please go directly to the Cancer Center and check in at the registration area.  Wear comfortable clothing and clothing appropriate for easy access to any Portacath or PICC line.   We strive to give you quality time with your provider. You may need to reschedule your appointment if you arrive late (15 or more minutes).  Arriving late affects you and other patients whose appointments are after yours.  Also, if you miss three or more appointments without notifying the office, you may be dismissed from the clinic at the provider's discretion.      For prescription refill requests, have your pharmacy contact our office and allow 72 hours for refills to be completed.    Today you received the following chemotherapy and/or immunotherapy agents Avastin    To help prevent nausea and vomiting after your treatment, we encourage you to take your nausea medication as directed.  BELOW ARE SYMPTOMS THAT SHOULD BE REPORTED IMMEDIATELY: *FEVER GREATER THAN 100.4 F (38 C) OR HIGHER *CHILLS OR SWEATING *NAUSEA AND VOMITING THAT IS NOT CONTROLLED WITH YOUR NAUSEA MEDICATION *UNUSUAL SHORTNESS OF BREATH *UNUSUAL BRUISING OR BLEEDING *URINARY PROBLEMS (pain or burning when urinating, or frequent urination) *BOWEL PROBLEMS (unusual diarrhea, constipation, pain near the anus) TENDERNESS IN MOUTH AND THROAT WITH OR WITHOUT PRESENCE OF ULCERS (sore throat, sores in mouth, or a toothache) UNUSUAL RASH, SWELLING OR PAIN  UNUSUAL VAGINAL DISCHARGE OR ITCHING   Items with * indicate a potential emergency and should be followed up as soon as possible or go to the Emergency Department if any problems should occur.  Please show the CHEMOTHERAPY ALERT CARD or IMMUNOTHERAPY ALERT CARD at check-in to  the Emergency Department and triage nurse.  Should you have questions after your visit or need to cancel or reschedule your appointment, please contact [facility] [place] [facility]  [phone] and follow the prompts.  Office hours are 8:00 a.m. to 4:30 p.m. Monday - Friday. Please note that voicemails left after 4:00 p.m. may not be returned until the following business day.  We are closed weekends and major holidays. You have access to a nurse at all times for urgent questions. Please call the main number to the clinic [phone] and follow the prompts.  For any non-urgent questions, you may also contact your provider using MyChart. We now offer e-Visits for anyone 18 and older to request care online for non-urgent symptoms. For details visit [email or web address].   Also download the MyChart app! Go to the app store, search "MyChart", open the app, select [facility], and log in with your MyChart username and password.  Due to Covid, a mask is required upon entering the hospital/clinic. If you do not have a mask, one will be given to you upon arrival. For doctor visits, patients may have 1 support person aged 18 or older with them. For treatment visits, patients cannot have anyone with them due to current Covid guidelines and our immunocompromised population.  

## 2020-11-17 NOTE — Progress Notes (Signed)
Patient here for follow up. Pt reports that she has developed a rash to her back with mild itching. She has used aloe vera cream which has helped some.

## 2020-12-01 ENCOUNTER — Encounter: Payer: Self-pay | Admitting: Oncology

## 2020-12-01 ENCOUNTER — Inpatient Hospital Stay: Payer: Medicare Other

## 2020-12-01 ENCOUNTER — Inpatient Hospital Stay (HOSPITAL_BASED_OUTPATIENT_CLINIC_OR_DEPARTMENT_OTHER): Payer: Medicare Other | Admitting: Oncology

## 2020-12-01 VITALS — BP 118/76 | HR 61 | Temp 97.8°F | Resp 18 | Wt 120.9 lb

## 2020-12-01 DIAGNOSIS — D6481 Anemia due to antineoplastic chemotherapy: Secondary | ICD-10-CM | POA: Diagnosis not present

## 2020-12-01 DIAGNOSIS — C569 Malignant neoplasm of unspecified ovary: Secondary | ICD-10-CM

## 2020-12-01 DIAGNOSIS — Z5112 Encounter for antineoplastic immunotherapy: Secondary | ICD-10-CM | POA: Diagnosis not present

## 2020-12-01 DIAGNOSIS — T451X5A Adverse effect of antineoplastic and immunosuppressive drugs, initial encounter: Secondary | ICD-10-CM | POA: Diagnosis not present

## 2020-12-01 DIAGNOSIS — Z5111 Encounter for antineoplastic chemotherapy: Secondary | ICD-10-CM | POA: Diagnosis not present

## 2020-12-01 DIAGNOSIS — D701 Agranulocytosis secondary to cancer chemotherapy: Secondary | ICD-10-CM | POA: Diagnosis not present

## 2020-12-01 DIAGNOSIS — D539 Nutritional anemia, unspecified: Secondary | ICD-10-CM | POA: Diagnosis not present

## 2020-12-01 DIAGNOSIS — D6959 Other secondary thrombocytopenia: Secondary | ICD-10-CM | POA: Diagnosis not present

## 2020-12-01 LAB — CBC WITH DIFFERENTIAL/PLATELET
Abs Immature Granulocytes: 0.01 10*3/uL (ref 0.00–0.07)
Basophils Absolute: 0 10*3/uL (ref 0.0–0.1)
Basophils Relative: 1 %
Eosinophils Absolute: 0 10*3/uL (ref 0.0–0.5)
Eosinophils Relative: 1 %
HCT: 33.6 % — ABNORMAL LOW (ref 36.0–46.0)
Hemoglobin: 11.5 g/dL — ABNORMAL LOW (ref 12.0–15.0)
Immature Granulocytes: 0 %
Lymphocytes Relative: 44 %
Lymphs Abs: 1.1 10*3/uL (ref 0.7–4.0)
MCH: 38.7 pg — ABNORMAL HIGH (ref 26.0–34.0)
MCHC: 34.2 g/dL (ref 30.0–36.0)
MCV: 113.1 fL — ABNORMAL HIGH (ref 80.0–100.0)
Monocytes Absolute: 0.6 10*3/uL (ref 0.1–1.0)
Monocytes Relative: 22 %
Neutro Abs: 0.8 10*3/uL — ABNORMAL LOW (ref 1.7–7.7)
Neutrophils Relative %: 32 %
Platelets: 181 10*3/uL (ref 150–400)
RBC: 2.97 MIL/uL — ABNORMAL LOW (ref 3.87–5.11)
RDW: 13.3 % (ref 11.5–15.5)
WBC: 2.6 10*3/uL — ABNORMAL LOW (ref 4.0–10.5)
nRBC: 0 % (ref 0.0–0.2)

## 2020-12-01 LAB — COMPREHENSIVE METABOLIC PANEL
ALT: 9 U/L (ref 0–44)
AST: 17 U/L (ref 15–41)
Albumin: 4.2 g/dL (ref 3.5–5.0)
Alkaline Phosphatase: 59 U/L (ref 38–126)
Anion gap: 7 (ref 5–15)
BUN: 13 mg/dL (ref 8–23)
CO2: 22 mmol/L (ref 22–32)
Calcium: 9.6 mg/dL (ref 8.9–10.3)
Chloride: 101 mmol/L (ref 98–111)
Creatinine, Ser: 0.59 mg/dL (ref 0.44–1.00)
GFR, Estimated: >60 mL/min (ref >60)
Glucose, Bld: 90 mg/dL (ref 70–99)
Potassium: 3.9 mmol/L (ref 3.5–5.1)
Sodium: 130 mmol/L — ABNORMAL LOW (ref 135–145)
Total Bilirubin: 0.7 mg/dL (ref 0.3–1.2)
Total Protein: 7.4 g/dL (ref 6.5–8.1)

## 2020-12-01 LAB — PROTEIN, URINE, RANDOM: Total Protein, Urine: <6 mg/dL

## 2020-12-01 MED ORDER — HEPARIN SOD (PORK) LOCK FLUSH 100 UNIT/ML IV SOLN
500.0000 [IU] | Freq: Once | INTRAVENOUS | Status: AC
  Administered 2020-12-01: 500 [IU] via INTRAVENOUS
  Filled 2020-12-01: qty 5

## 2020-12-01 MED ORDER — SODIUM CHLORIDE 0.9% FLUSH
10.0000 mL | Freq: Once | INTRAVENOUS | Status: AC
  Administered 2020-12-01: 10 mL via INTRAVENOUS
  Filled 2020-12-01: qty 10

## 2020-12-01 MED ORDER — LIDOCAINE-PRILOCAINE 2.5-2.5 % EX CREA: 1.0000 "application " | TOPICAL_CREAM | CUTANEOUS | 1 refills | Status: AC | PRN

## 2020-12-01 NOTE — Progress Notes (Signed)
Katie Hill Follow up visit  Patient Care Team: Patient, No Pcp Per (Inactive) as PCP - General (General Practice) Clent Jacks, [name], [name], [name], MD as Referring Physician (Obstetrics and Gynecology)  CHIEF COMPLAINTS/PURPOSE OF Visit Follow up for chemotherapy tolerability of  ovarian Hill.  HISTORY OF PRESENTING ILLNESS: Katie Hill 70 y.o. female presents for follow up of management of stage IIIC ovarian Hill. She underwent  Neoadjuvant chemotherapy of carbo and taxol x 4  # Patient had debulking surgery on 03/14/2017. She had a laparoscopy with conversion to laparotomy, total hysterectomy, with bilateral salpingo oophorectomy, right aortic lymph node dissection, omentectomy. Pathology showed small foci of residual disease in ovary.   Genetic testing negative for 83 genes on [facility]'s Multi-Hill panel (ALK, APC, ATM, AXIN2, BAP1, BARD1, BLM, BMPR1A, BRCA1, BRCA2, BRIP1, CASR, CDC73, CDH1, CDK4, CDKN1B, CDKN1C, CDKN2A, CEBPA, CHEK2, CTNNA1, DICER1, DIS3L2, EGFR, EPCAM, FH, FLCN, GATA2, GPC3, GREM1, HOXB13, HRAS, KIT, MAX, MEN1, MET, MITF, MLH1, MSH2, MSH3, MSH6, MUTYH, NBN, NF1, NF2, NTHL1, PALB2, PDGFRA, PHOX2B, PMS2, POLD1, POLE, POT1, PRKAR1A, PTCH1, PTEN, RAD50, RAD51C, RAD51D, RB1, RECQL4, RET, RUNX1, SDHA, SDHAF2, SDHB, SDHC, SDHD, SMAD4, SMARCA4, SMARCB1, SMARCE1, STK11, SUFU, TERC, TERT, TMEM127, TP53, TSC1, TSC2, VHL, WRN, WT1).   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  Myraid testing negative for somatic BRACA1/2, positive for HRD.   # HRD positive, was referred to [facility] for clinical trials of Olarparib maintenance. She opted out.  #  Treatment:  Stage IIIC Ovarian Hill:  #s/p Carboplatin and taxol x 4 neoadjuvant chemotherapy followed by debulking  surgery 03/14/2017 # 03/28/2017 S/p Adjuvant carbo and taxol x3   Local recurrence, CA125 one 46.3 03/15/2018-05/17/2018 Carboplatin and Taxol x 4. CA125 decrease from 49.7 to 6 after 4 cycles of treatment.  Started on Olarparib $RemoveBefo'300mg'hJlLlgOCxqC$  BID on 05/17/2018.  # was seen by Dr. Fransisca Connors for further evaluation due to rising Ca1 25 and MRI abdomenPelvis on 11/05/2018 showed Interval progression of, now measuring 2.7 x 2.3 cm and concerning for progression of metastatic disease.retrocaval lymph node in the abdomen Olaparib was held for short period of time. Her CA125 trended down again.  Dr. Fransisca Connors recommending resume olaparib and continue monitor. If she has more significant progression of disease she may be a candidate for clinical trial or will be treated with other chemotherapy drugs including Doxil, gemcitabine and Avastin.  #10/06/2020, CT chest abdomen pelvis with contrast showed new lymph nodes in the prevascular space of the upper anterior mediastinum most consistent with metastasis.  Single new right lower lobe pulmonary nodule is concerning for early pulmonary metastasis.  Interval enlargement of the left periaortic retroperitoneal lymph node.  Stable adjacent aorto caval adenopathy.  10/29/2020 MUGA LVEF normal 10/29/2020 Stopped Olarparib  11/03/2020 start Doxil + bevacizumab  INTERVAL HISTORY 70 y.o. female with above oncology history reviewed by me today presents for evaluation for chemotherapy for treatment of ovarian Hill  Patient was accompanied by her husband. Status post cycle 1 day 1 Doxil/bevacizumab.   Overall she feels well except for some skin itchiness/hives.  No new complaints.  Denies any fever, chills, dysuria, cough or shortness of breath. . Review of Systems  Constitutional:  Negative for appetite change, chills, fatigue and fever.  HENT:   Negative for hearing loss and voice change.   Eyes:  Negative for eye  problems.  Respiratory:  Negative for chest tightness and cough.    Cardiovascular:  Negative for chest pain.  Gastrointestinal:  Negative for abdominal distention, abdominal pain and blood in stool.  Endocrine: Negative for hot flashes.  Genitourinary:  Negative for difficulty urinating and frequency.   Musculoskeletal:  Negative for arthralgias.  Skin:  Positive for rash. Negative for itching.  Neurological:  Negative for extremity weakness.  Hematological:  Negative for adenopathy.  Psychiatric/Behavioral:  Negative for confusion.     [name] MEDICAL HISTORY: Past Medical History:  Diagnosis Date   Dysrhythmia    Genetic testing 03/28/2017   Multi-Hill panel (83 genes) @ [facility] - No pathogenic mutations detected   High grade ovarian Hill ([place]) 11/20/2016   Pelvic mass in female     SURGICAL HISTORY: Past Surgical History:  Procedure Laterality Date   APPENDECTOMY     LAPAROSCOPY N/A 03/14/2017   Procedure: LAPAROSCOPY OPERATIVE;  Surgeon: Mellody Drown, MD;  Location: [facility];  Service: Gynecology;  Laterality: N/A;   LAPAROTOMY N/A 03/14/2017   Procedure: LAPAROTOMY;  Surgeon: Mellody Drown, MD;  Location: [facility];  Service: Gynecology;  Laterality: N/A;   LYMPH NODE DISSECTION N/A 03/14/2017   Procedure: LYMPH NODE DISSECTION;  Surgeon: Mellody Drown, MD;  Location: [facility];  Service: Gynecology;  Laterality: N/A;   OMENTECTOMY N/A 03/14/2017   Procedure: OMENTECTOMY;  Surgeon: Mellody Drown, MD;  Location: [facility];  Service: Gynecology;  Laterality: N/A;   PORTA CATH INSERTION N/A 11/27/2016   Procedure: Glori Luis Cath Insertion;  Surgeon: Algernon Huxley, MD;  Location: [facility];  Service: Cardiovascular;  Laterality: N/A;    SOCIAL HISTORY: Social History   Tobacco Use   Smoking status: Never   Smokeless tobacco: Never  Vaping Use   Vaping Use: Never used  Substance Use Topics   Alcohol use: Not Currently   Drug use: No     FAMILY HISTORY Family History  Problem Relation Age of Onset   Throat Hill  Cousin    Throat Hill Cousin    Leukemia Cousin     ALLERGIES:  is allergic to omeprazole.  MEDICATIONS:  Current Outpatient Medications  Medication Sig Dispense Refill   hydrocortisone cream 0.5 % Apply 1 application topically 3 (three) times daily. 30 g 0   vitamin B-12 (CYANOCOBALAMIN) 1000 MCG tablet Take 1 tablet (1,000 mcg total) by mouth daily. 90 tablet 4   lidocaine-prilocaine (EMLA) cream Apply 1 application topically as needed. Apply small amount to port site at least 1 hour prior to it being accessed, cover with plastic wrap 30 g 1   olaparib (LYNPARZA) 150 MG tablet TAKE TWO TABLETS BY MOUTH TWICE A DAY (SWALLOW WHOLE. MAY TAKE WITH FOOD TO DECREASE NAUSEA AND VOMITING) (Patient not taking: No sig reported) 120 tablet 2   No current facility-administered medications for this visit.    PHYSICAL EXAMINATION:  ECOG PERFORMANCE STATUS: 0 - Asymptomatic Vitals:   12/01/20 0842  BP: 118/76  Pulse: 61  Resp: 18  Temp: 97.8 F (36.6 C)    Filed Weights   12/01/20 0842  Weight: 120 lb 14.4 oz (54.8 kg)     Physical Exam Constitutional:      General: She is not in acute distress.    Appearance: She is not diaphoretic.     Comments: Thin built, she walks independantly  HENT:     Head: Normocephalic and atraumatic.     Nose: Nose normal.     Mouth/Throat:  Pharynx: No oropharyngeal exudate.  Eyes:     General: No scleral icterus.    Pupils: Pupils are equal, round, and reactive to light.  Neck:     Vascular: No JVD.  Cardiovascular:     Rate and Rhythm: Normal rate and regular rhythm.     Heart sounds: No murmur heard. Pulmonary:     Effort: Pulmonary effort is normal. No respiratory distress.     Breath sounds: Normal breath sounds. No rales.  Chest:     Chest wall: No tenderness.  Abdominal:     General: Bowel sounds are normal. There is no distension.     Palpations: Abdomen is soft.     Tenderness: There is no abdominal tenderness.   Musculoskeletal:        General: Normal range of motion.     Cervical back: Normal range of motion and neck supple.  Lymphadenopathy:     Cervical: No cervical adenopathy.  Skin:    General: Skin is warm and dry.     Findings: Rash present. No erythema.     Comments: Right anterior medi port +   Neurological:     Mental Status: She is alert and oriented to person, place, and time.     Cranial Nerves: No cranial nerve deficit.     Motor: No abnormal muscle tone.     Coordination: Coordination normal.  Psychiatric:        Mood and Affect: Affect normal.        Judgment: Judgment normal.       LABORATORY DATA: I have personally reviewed the data as listed: CBC    Component Value Date/Time   WBC 2.6 (L) 12/01/2020 0819   RBC 2.97 (L) 12/01/2020 0819   HGB 11.5 (L) 12/01/2020 0819   HCT 33.6 (L) 12/01/2020 0819   PLT 181 12/01/2020 0819   MCV 113.1 (H) 12/01/2020 0819   MCH 38.7 (H) 12/01/2020 0819   MCHC 34.2 12/01/2020 0819   RDW 13.3 12/01/2020 0819   LYMPHSABS 1.1 12/01/2020 0819   MONOABS 0.6 12/01/2020 0819   EOSABS 0.0 12/01/2020 0819   BASOSABS 0.0 12/01/2020 0819   CMP Latest Ref Rng & Units 12/01/2020 11/17/2020 11/03/2020  Glucose 70 - 99 mg/dL 90 86 88  BUN 8 - 23 mg/dL _0 Creatinine 0.44 - 1.00 mg/dL 0.59 0.52 0.60  Sodium 135 - 145 mmol/L 130(L) 132(L) 133(L)  Potassium 3.5 - 5.1 mmol/L 3.9 3.9 3.9  Chloride 98 - 111 mmol/L 101 102 105  CO2 22 - 32 mmol/L _1 Calcium 8.9 - 10.3 mg/dL 9.6 9.6 9.5  Total Protein 6.5 - 8.1 g/dL 7.4 7.0 6.9  Total Bilirubin 0.3 - 1.2 mg/dL 0.7 0.9 0.5  Alkaline Phos 38 - 126 U/L 59 63 54  AST 15 - 41 U/L _2 ALT 0 - 44 U/L _3 Pathology 11/16/2016 Surgical Pathology  CASE: [id number]  PATIENT: Jodie Schrom  Surgical Pathology Report   SPECIMEN SUBMITTED:  A. Retroperitoneal adenopathy, left  DIAGNOSIS:  A. LYMPH NODE, LEFT RETROPERITONEAL; CT-GUIDED CORE BIOPSY:  - METASTATIC HIGH-GRADE  SEROUS CARCINOMA.  Pathology 03/14/2017   DIAGNOSIS:  A. OMENTUM; OMENTECTOMY:  - NO TUMOR SEEN.  - ONE NEGATIVE LYMPH NODE (0/1).   B.  RIGHT FALLOPIAN TUBE AND OVARY; SALPINGO-OOPHORECTOMY:  - SMALL FOCI OF HIGH GRADE SEROUS CARCINOMA INVOLVING THE OVARY.  - MARKED THERAPY RELATED CHANGE.  - NO TUMOR SEEN  IN THE FALLOPIAN TUBE.   C.  UTERUS, CERVIX, LEFT FALLOPIAN TUBE AND OVARY; HYSTERECTOMY AND LEFT  SALPINGO-OOPHORECTOMY:  - NABOTHIAN CYSTS.  - CYSTIC ATROPHY OF THE ENDOMETRIUM.  - SEROSAL ADHESIONS.  - UNREMARKABLE FALLOPIAN TUBE.  - OVARY SHOWING TREATMENT RELATED CHANGE.   D.  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    RADIOGRAPHIC STUDIES: I have personally reviewed the radiological images as listed and agreed with the findings in the report. NM Cardiac Muga Rest  Result Date: 10/29/2020 CLINICAL DATA:  Ovarian Hill, pre cardiotoxic chemotherapy EXAM: NUCLEAR MEDICINE CARDIAC BLOOD POOL IMAGING (MUGA) TECHNIQUE: Cardiac multi-gated acquisition was performed at rest following intravenous injection of Tc-59m labeled red blood cells. RADIOPHARMACEUTICALS:  20.5 mCi Tc-101m pertechnetate in-vitro labeled red blood cells IV COMPARISON:  None FINDINGS: Calculated LEFT ventricular ejection fraction is 66%, normal. Study was obtained at a cardiac rate of 63 bpm. Patient was rhythmic during imaging. Cine analysis of the LEFT ventricle in 3 projections demonstrates normal LEFT ventricular wall motion. IMPRESSION: Normal LEFT ventricular ejection fraction of 66%. Normal LV wall motion. Electronically Signed   By: Lavonia Dana M.D.   On: 10/29/2020 16:27   CT CHEST ABDOMEN PELVIS W CONTRAST  Result Date: 10/07/2020 CLINICAL DATA:  Follow-up malignant ovarian carcinoma. Initial diagnosis 2018. Chemotherapy completed 2018. Recurrence 2019 with chemotherapy. Surgical history of appendectomy a omentectomy and total  hysterectomy. EXAM: CT CHEST, ABDOMEN, AND PELVIS WITH CONTRAST TECHNIQUE: Multidetector CT imaging of the chest, abdomen and pelvis was performed following the standard protocol during bolus administration of intravenous contrast. CONTRAST:  31mL OMNIPAQUE IOHEXOL 300 MG/ML  SOLN COMPARISON:  CT 06/30/2020 FINDINGS: CT CHEST FINDINGS Cardiovascular: No significant vascular findings. Normal heart size. No pericardial effusion. Port in the anterior chest wall with tip in distal SVC. Mediastinum/Nodes: New prevascular lymph node in the anterior mediastinum measuring 9 mm (image 23/series 2). Slightly more superior prevascular node measures 9 mm on image 19. High LEFT paratracheal node measures 11 mm on image 14. No supraclavicular adenopathy. Lungs/Pleura: Biapical pleuroparenchymal thickening. Round nodule in the RIGHT lower lobe measuring 4 mm (image 97/3) is new from comparison exam. Musculoskeletal: No aggressive osseous lesion. CT ABDOMEN AND PELVIS FINDINGS Hepatobiliary: No focal hepatic lesion. No biliary ductal dilatation. Gallbladder is normal. Common bile duct is normal. Pancreas: Pancreas is normal. No ductal dilatation. No pancreatic inflammation. Spleen: Normal spleen Adrenals/urinary tract: Adrenal glands and kidneys are normal. The ureters and bladder normal. Stomach/Bowel: Stomach small-bowel cecum normal. Colon and rectosigmoid colon normal. Vascular/Lymphatic: Periaortic retroperitoneal node LEFT aorta at the level of the renal vein measures 16 mm (image 69/2) compared to 11 mm. Elongated lymph node position between the IVC and aorta measures 2.3 x 3.9 cm compared with 2.6 x 3.8 cm for no significant change. No pelvic lymphadenopathy. Reproductive: Post hysterectomy.  Adnexa unremarkable Other: No peritoneal metastasis. Trace free fluid in the pelvis is unchanged (image 110/2) Musculoskeletal: No aggressive osseous lesion. IMPRESSION: Chest Impression: 1. New lymph nodes in the prevascular space of  the upper anterior mediastinum most consistent with lymphangitic spread of carcinoma. 2. Single new RIGHT lower lobe pulmonary nodule is concerning for early pulmonary metastasis. Abdomen / Pelvis Impression: 1. Interval enlargement of a LEFT periaortic retroperitoneal lymph node. Stable adjacent aortocaval adenopathy. 2. No peritoneal metastasis or ascites.  No liver metastasis. Electronically Signed   By: Suzy Bouchard M.D.   On: 10/07/2020 08:38     ASSESSMENT/PLAN  Hill Staging Malignant neoplasm of ovary [facility]) Staging form: Ovary, Fallopian Tube, and Primary Peritoneal Carcinoma, AJCC 8th Edition - Clinical stage from 11/22/2016: Stage IIIC (cT3c, cN1b, cM0) - Signed by Earlie Server, MD on 11/22/2016 Stage used in treatment planning: Yes National guidelines used in treatment planning: Yes Type of national guideline used in treatment planning: NCCN  1. Encounter for antineoplastic chemotherapy   2. High grade ovarian Hill ([place])   3. Anemia associated with chemotherapy   4. Malignant neoplasm of ovary, unspecified laterality ([place])   5. Macrocytic anemia   : # Ovarian Hill, local recurrence.  Currently on olaparib 300 mg twice daily. She tolerates well.  CA 125 18.7-->61.2-->111-->90.5-->69--> 54.8--> 29.1-->47-->71.2-->67.7->73.2-->82.8-->70.4-->66.4--> 58.4--> 64.9--> 56.9--> 42.2-->47.7-->54.4-->68.2-> 97-->168 No available clinical trials at [facility] reviewed and discussed with patient To hold off chemotherapy due to neutropenia.  See below  #Chemotherapy-induced neutropenia, ANC 0.8.  Hold off treatment. Add G-CSF support with on pro Neulasta next week for cycle 2 treatments.  Chronic anemia, hemoglobin stable at 11.5  Chemotherapy-induced.  Stable and continue to monitor.   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  Check vitamin B12.   # Skin Rash, topical steroid cream. Rx sent  Port-A-Cath in place  RTF 1 weeks for lab MD Doxil/ bevacizumab.    Earlie Server, MD,  PhD Hematology Oncology [facility] Pager- [phone] 12/01/20

## 2020-12-01 NOTE — Progress Notes (Signed)
Pt here for follow up. Pt reports dry flaky skin.

## 2020-12-02 LAB — CA 125: Cancer Antigen (CA) 125: 70.3 U/mL — ABNORMAL HIGH (ref 0.0–38.1)

## 2020-12-08 ENCOUNTER — Inpatient Hospital Stay (HOSPITAL_BASED_OUTPATIENT_CLINIC_OR_DEPARTMENT_OTHER): Payer: Medicare Other | Admitting: Oncology

## 2020-12-08 ENCOUNTER — Inpatient Hospital Stay (HOSPITAL_BASED_OUTPATIENT_CLINIC_OR_DEPARTMENT_OTHER): Payer: Medicare Other | Admitting: Nurse Practitioner

## 2020-12-08 ENCOUNTER — Inpatient Hospital Stay: Payer: Medicare Other

## 2020-12-08 ENCOUNTER — Encounter: Payer: Self-pay | Admitting: Oncology

## 2020-12-08 VITALS — BP 118/63 | HR 73 | Temp 96.9°F | Resp 18 | Wt 120.8 lb

## 2020-12-08 VITALS — BP 118/63 | HR 73 | Temp 96.9°F | Resp 20 | Wt 120.8 lb

## 2020-12-08 DIAGNOSIS — D539 Nutritional anemia, unspecified: Secondary | ICD-10-CM | POA: Diagnosis not present

## 2020-12-08 DIAGNOSIS — Z5111 Encounter for antineoplastic chemotherapy: Secondary | ICD-10-CM | POA: Diagnosis not present

## 2020-12-08 DIAGNOSIS — D696 Thrombocytopenia, unspecified: Secondary | ICD-10-CM

## 2020-12-08 DIAGNOSIS — D701 Agranulocytosis secondary to cancer chemotherapy: Secondary | ICD-10-CM | POA: Diagnosis not present

## 2020-12-08 DIAGNOSIS — T451X5A Adverse effect of antineoplastic and immunosuppressive drugs, initial encounter: Secondary | ICD-10-CM | POA: Diagnosis not present

## 2020-12-08 DIAGNOSIS — D6481 Anemia due to antineoplastic chemotherapy: Secondary | ICD-10-CM | POA: Diagnosis not present

## 2020-12-08 DIAGNOSIS — C569 Malignant neoplasm of unspecified ovary: Secondary | ICD-10-CM

## 2020-12-08 DIAGNOSIS — Z5112 Encounter for antineoplastic immunotherapy: Secondary | ICD-10-CM | POA: Diagnosis not present

## 2020-12-08 DIAGNOSIS — D6959 Other secondary thrombocytopenia: Secondary | ICD-10-CM | POA: Diagnosis not present

## 2020-12-08 LAB — CBC WITH DIFFERENTIAL/PLATELET
Abs Immature Granulocytes: 0.01 10*3/uL (ref 0.00–0.07)
Basophils Absolute: 0 10*3/uL (ref 0.0–0.1)
Basophils Relative: 1 %
Eosinophils Absolute: 0.1 10*3/uL (ref 0.0–0.5)
Eosinophils Relative: 2 %
HCT: 32.7 % — ABNORMAL LOW (ref 36.0–46.0)
Hemoglobin: 11.2 g/dL — ABNORMAL LOW (ref 12.0–15.0)
Immature Granulocytes: 0 %
Lymphocytes Relative: 29 %
Lymphs Abs: 0.9 10*3/uL (ref 0.7–4.0)
MCH: 38 pg — ABNORMAL HIGH (ref 26.0–34.0)
MCHC: 34.3 g/dL (ref 30.0–36.0)
MCV: 110.8 fL — ABNORMAL HIGH (ref 80.0–100.0)
Monocytes Absolute: 0.5 10*3/uL (ref 0.1–1.0)
Monocytes Relative: 16 %
Neutro Abs: 1.6 10*3/uL — ABNORMAL LOW (ref 1.7–7.7)
Neutrophils Relative %: 52 %
Platelets: 115 10*3/uL — ABNORMAL LOW (ref 150–400)
RBC: 2.95 MIL/uL — ABNORMAL LOW (ref 3.87–5.11)
RDW: 13.1 % (ref 11.5–15.5)
WBC: 3.2 10*3/uL — ABNORMAL LOW (ref 4.0–10.5)
nRBC: 0 % (ref 0.0–0.2)

## 2020-12-08 LAB — COMPREHENSIVE METABOLIC PANEL
ALT: 9 U/L (ref 0–44)
AST: 17 U/L (ref 15–41)
Albumin: 4.2 g/dL (ref 3.5–5.0)
Alkaline Phosphatase: 64 U/L (ref 38–126)
Anion gap: 6 (ref 5–15)
BUN: 14 mg/dL (ref 8–23)
CO2: 24 mmol/L (ref 22–32)
Calcium: 9.7 mg/dL (ref 8.9–10.3)
Chloride: 102 mmol/L (ref 98–111)
Creatinine, Ser: 0.57 mg/dL (ref 0.44–1.00)
GFR, Estimated: >60 mL/min (ref >60)
Glucose, Bld: 112 mg/dL — ABNORMAL HIGH (ref 70–99)
Potassium: 4 mmol/L (ref 3.5–5.1)
Sodium: 132 mmol/L — ABNORMAL LOW (ref 135–145)
Total Bilirubin: 0.6 mg/dL (ref 0.3–1.2)
Total Protein: 7.1 g/dL (ref 6.5–8.1)

## 2020-12-08 LAB — PROTEIN, URINE, RANDOM: Total Protein, Urine: <6 mg/dL

## 2020-12-08 LAB — VITAMIN B12: Vitamin B-12: 1007 pg/mL — ABNORMAL HIGH (ref 180–914)

## 2020-12-08 MED ORDER — DEXTROSE 5 % IV SOLN
Freq: Once | INTRAVENOUS | Status: AC
  Filled 2020-12-08: qty 250

## 2020-12-08 MED ORDER — SODIUM CHLORIDE 0.9 % IV SOLN
10.0000 mg | Freq: Once | INTRAVENOUS | Status: AC
  Administered 2020-12-08: 10 mg via INTRAVENOUS
  Filled 2020-12-08: qty 10

## 2020-12-08 MED ORDER — SODIUM CHLORIDE 0.9 % IV SOLN
Freq: Once | INTRAVENOUS | Status: AC
  Filled 2020-12-08: qty 250

## 2020-12-08 MED ORDER — SODIUM CHLORIDE 0.9 % IV SOLN
10.0000 mg/kg | Freq: Once | INTRAVENOUS | Status: AC
  Administered 2020-12-08: 500 mg via INTRAVENOUS
  Filled 2020-12-08: qty 16

## 2020-12-08 MED ORDER — HEPARIN SOD (PORK) LOCK FLUSH 100 UNIT/ML IV SOLN
INTRAVENOUS | Status: AC
  Administered 2020-12-08: 500 [IU]
  Filled 2020-12-08: qty 5

## 2020-12-08 MED ORDER — DOXORUBICIN HCL LIPOSOMAL CHEMO INJECTION 2 MG/ML
44.0000 mg/m2 | Freq: Once | INTRAVENOUS | Status: AC
  Administered 2020-12-08: 70 mg via INTRAVENOUS
  Filled 2020-12-08: qty 25

## 2020-12-08 MED ORDER — PEGFILGRASTIM 6 MG/0.6ML ~~LOC~~ PSKT
6.0000 mg | PREFILLED_SYRINGE | Freq: Once | SUBCUTANEOUS | Status: AC
  Administered 2020-12-08: 6 mg via SUBCUTANEOUS
  Filled 2020-12-08: qty 0.6

## 2020-12-08 NOTE — Progress Notes (Signed)
Gynecologic Oncology Interval Visit   Referring Provider: Dr. 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 G0P0 female with locally recurrent advanced ovarian cancer, now on doxil-bev since July 2022 who returns to clinic for pelvic exam.    She was diagnosed with high grade serous ovarian cancer (HRD) on CT directed node biopsy with partial bowel obstruction and extensive bulky adenopathy on 11/2016 and received 4 cycles of carbo-taxol with dramatic decline in CA 125 and improvement in imaging. She underwent interval debulking surgery to no gross residual disease on 03/14/2017 followed by re-initiation of chemotherapy on 03/28/17 with normalization of CA-125.  She had a platinum sensitive recurrence and received carbo-taxol 03/15/2018-04/26/2018 followed by olaparib since 2/20 for somatic HRD positive malignancy. She was felt to have radiologic progression. Case was discussed at [facility] and recommendation was to continue parp inhibitor as she continued to be asymptomatic.   In the interim, CA 125 uptrended to 168 in July 2022 and she had symptoms. She was not a candidate for clinical trials. She elected to stop olaparib and started doxil (70 mg)-bev (10 mg/kg) on 11/03/20. She receives bev every 2 weeks. Treatment was held last week due to neutropenia. Neulasta is planned to be added for cycle 2. CA 125 has improved to 70.3 after 1 cycle.   She continues to feel well. Denies changes in her bowel movements. No abdominal fullness, pain. Eating and drinking normally. No changes in her weight.    Oncology History: Katie Hill is a pleasant. G0P0 female with advanced ovarian cancer.  She presented to the ER 11/13/2016 with symptoms of right lower quadrant pain for 3-4 weeks, associated with nausea, bloating, no change in weight, urinary frequency, and change in bowel habits with more diffucult and  stringy BM's.  CT CHEST, ABDOMEN, AND PELVIS WITH CONTRAST IMPRESSION: 1. Large heterogeneous central pelvic mass measures up to 12.6 cm. The lesion generates substantial mass-effect on the uterus, bladder, and pelvic sidewall anatomy. The mass appears to invade the mid sigmoid colon. Etiology of the central pelvic mass is not definitive by CT, but ovarian primary is suspected. The lesion becomes indistinguishable from the posterior uterus on some images and uterine origin is possible, but the uterus does not appear to be the epicenter of the mass and appears to be more displaced by it. MRI of the pelvis without and with contrast may prove helpful to better delineate the relationship of the uterus to the central pelvic mass although it may not be able to definitively localize the origin. 2. Bulky retroperitoneal and left pelvic sidewall lymphadenopathy. The retroperitoneal lymphadenopathy generates substantial mass-effect on the IVC and left renal vein. The external iliac veins along each pelvic sidewall are markedly attenuated by the mass/lymphadenopathy. Patency of these vessels cannot be definitely confirmed on this exam.   11/16/16 LYMPH NODE, LEFT RETROPERITONEAL; CT-GUIDED CORE BIOPSY:  - METASTATIC HIGH-GRADE SEROUS CARCINOMA.   Decision was made to do neoadjuvant carbo/taxol chemotherapy.  CA125 fell from 4,382 to 64.6 (10/29).  CT scan showed dramatic response.  CT scan 10/14 Vascular/Lymphatic: Normal appearance of the abdominal aorta. Interval decrease in size of previously identified bulky retroperitoneal adenopathy. Index aortocaval node measures 1.6 x 2.6 cm, image 32 of series 2. Previously 4.4 x 5.3 cm. Index left periaortic nodal mass measures 2.2 x 1.3 cm, image 31 of series 2. Previously 3.7 x 4.7 cm. At the level  of the bifurcation there is a low-attenuation nodal mass which measure 3.1 x 2.6 cm, image 44 of series 2. Previously 4.3 x 4.8 cm. Left common iliac node measures 1.2 cm,  image 53 of series 2. Previously 2.2 cm. Left external iliac node measures 1.6 x 1.1 cm, image 67 of series 2. Previously 4.8 x 2.6 cm. Reproductive: Large mass centered around the uterus measures 9.4 x 5.5 cm, image 67 of series 2. Previously this measured 12.6 x 9.2 cm  On 03/14/2017 she underwent L/S converted to XL, TAH, BSO, right PA node resection, omentectomy, and LOA to no residual disease.   Pathology 03/14/2017  DIAGNOSIS:  A. OMENTUM; OMENTECTOMY:  - NO TUMOR SEEN.  - ONE NEGATIVE LYMPH NODE (0/1).   B.  RIGHT FALLOPIAN TUBE AND OVARY; SALPINGO-OOPHORECTOMY:  - SMALL FOCI OF HIGH GRADE SEROUS CARCINOMA INVOLVING THE OVARY.  - MARKED THERAPY RELATED CHANGE.  - NO TUMOR SEEN IN THE FALLOPIAN TUBE.   C.  UTERUS, CERVIX, LEFT FALLOPIAN TUBE AND OVARY; HYSTERECTOMY AND LEFT  SALPINGO-OOPHORECTOMY:  - NABOTHIAN CYSTS.  - CYSTIC ATROPHY OF THE ENDOMETRIUM.  - SEROSAL ADHESIONS.  - UNREMARKABLE FALLOPIAN TUBE.  - OVARY SHOWING TREATMENT RELATED CHANGE.   D.  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   Then completed an additional 3 cycles of carbo/Taxol and CA 125 was 7.4 05/09/17.  CA 125  14 in 5/19  21 in 8/19.   CT scan 11/22/17 IMPRESSION: 1. Interval debulking surgery with decrease in retroperitoneal and extraperitoneal lymphadenopathy. In some areas the lymphadenopathy has resolved completely. Peritoneal implant seen along the falciform ligament on the prior study has decreased. 2. 4 x 2 cm collection of complex fluid or soft tissue in the right cul-de-sac is in the region of the complex mass lesion seen on the previous study. Given that there is some apparent enhancement peripherally, residual/recurrent disease is a concern and close attention will be required. 3. Stable tiny bilateral pulmonary nodules with no pleural effusion.  CA125  25.7 12/24/17 46.3 02/18/18  02/28/2018- CT C/A/P  1.  Compared to the prior examination there has been interval enlargement of an aortocaval lymph node which currently measures 1.4 cm in short axis (axial image 75 of series 2). This lymph node is in close proximity to the previously resected retroperitoneal lymphadenopathy and is very concerning for an additional nodal metastasis. 2. Other findings in the chest, abdomen and pelvis appear very similar to prior examinations, as discussed above. 3. Aortic atherosclerosis.  She received 3 cycles of carbo-Taxol 03/15/2018-04/26/2018. CA 125 improved to 6.0. She started olaparib 300 mg BID 2/20 for platinum-sensitive recurrent ovarian cancer (HRD positive).   CA125 04/05/2018    15.3 04/26/2018       6.3 05/17/2018         6.0 06/07/2018        6.9 06/21/2018 7.0 07/05/2018 8.4 08/02/2018 18.7 08/29/2018 61.2 (H) 09/23/2018  111.0 (H) 10/21/2018 90.5 (H)    11/04/18 MRI Abd/Pelvis IMPRESSION: 1. Interval progression of a retrocaval lymph node in the abdomen, now measuring 2.7 x 2.3 cm and concerning for progression of metastatic disease. 2. Aortocaval lymph node seen as decreased on the prior study is stable. This is unenlarged by MR criteria. 3. 3.3 x 1.6 cm homogeneous lesion posterior right pelvis is smaller than 02/28/2018. This is homogeneous and shows thin, smooth peripheral enhancement with no evidence for central enhancement and likely represents a chronic postoperative  collection although metastatic disease not entirely excluded.  She continued on olaparib (she only stopped for about a 1/2 a day) and her CA125 values declined. She saw Dr. Tasia Catchings on 02/12/2019. She had chronic anemia and grade 1 thrombocytopenia, secondary to olaparib. Otherwise tolerated treatment very well.   05/08/2019 CT C/A/P IMPRESSION: 1. Mild progression of metastatic disease. Mildly enlarged aortocaval lymph node is newly  enlarged since 11/05/2018 MRI and 08/28/2018 CT studies. Separate enlarged retrocaval lymph node is stable  since 11/05/2018 MRI and increased since 08/28/2018 CT. 2. Stable posterior right pelvic soft tissue focus, characterized as probable chronic postsurgical collection on prior MRI. 3.  Aortic Atherosclerosis (ICD10-I70.0).  Reviewed imaging 08/2018; 10/2018; 04/2019; Measurement vary on imaging reads. She does not have a baseline CT scan from 05/2018 1.Enlarged retrocaval lymph node measures 2.2 cm short axis diameter (series 2/image 29), compared to 2.2 cm 11/05/2018 MRI using similar measuring technique and 1.5 cm on 08/28/2018 CT. 2. Posterior pelvic 3.2 x 1.9 cm soft tissue focus just to the right of midline (series 2/image 64), previously 3.5 x 1.6 cm on 08/28/2018 CT. 3.3 x 1.6 cm  On MRI 10/2018. 3. New enlarged PA node 1.5 cm (series 2/image 29)  CT 3/21  IMPRESSION: 1. Previously noted retrocaval lymph node is stable, while the adjacent aortocaval lymph node has slightly increased in size compared to the prior examination. Previously noted soft tissue attenuation lesion in the posterior aspect of the anatomic pelvis appear stable compared to the prior study. No other new sites of metastatic disease are noted elsewhere in the chest, abdomen or pelvis. 2. Previously noted small pulmonary nodules are all stable in number and size compared to the prior examinations, favored to be benign. 3. Aortic atherosclerosis.  CT 06/30/20 IMPRESSION: 1. Stable size and appearance of the pulmonary nodules, retroperitoneal adenopathy, and posterior pelvic soft tissue. No interval findings new or progressive disease.  2. Again seen is prominent stool volume in the right and transverse segments of the colon, possibly compatible with clinical constipation. 3. Aortic atherosclerosis.  Aortic Atherosclerosis (ICD10-I70.0).   Genetic testing negative for 83 genes on [facility]'s Multi-Cancer panel (ALK, APC, ATM, AXIN2, BAP1, BARD1, BLM, BMPR1A, BRCA1, BRCA2, BRIP1, CASR, CDC73, CDH1, CDK4, CDKN1B, CDKN1C, CDKN2A,  CEBPA, CHEK2, CTNNA1, DICER1, DIS3L2, EGFR, EPCAM, FH, FLCN, GATA2, GPC3, GREM1, HOXB13, HRAS, KIT, MAX, MEN1, MET, MITF, MLH1, MSH2, MSH3, MSH6, MUTYH, NBN, NF1, NF2, NTHL1, PALB2, PDGFRA, PHOX2B, PMS2, POLD1, POLE, POT1, PRKAR1A, PTCH1, PTEN, RAD50, RAD51C, RAD51D, RB1, RECQL4, RET, RUNX1, SDHA, SDHAF2, SDHB, SDHC, SDHD, SMAD4, SMARCA4, SMARCB1, SMARCE1, STK11, SUFU, TERC, TERT, TMEM127, TP53, TSC1, TSC2, VHL, WRN, WT1).   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 This is still considered a normal result.   Somatic tumor testing:  She has HRD positive cancer, negative for somatic or germline BRCA mutation.   Patient Active Problem List   Diagnosis Date Noted   High grade ovarian cancer ([place]) 12/04/2019   Encounter for antineoplastic chemotherapy 04/14/2019   Goals of care, counseling/discussion 03/09/2018   Genetic testing 03/28/2017   S/P total abdominal hysterectomy and bilateral salpingo-oophorectomy 03/14/2017   Hyponatremia 12/12/2016   Dehydration 12/08/2016   Malignant neoplasm of ovary ([address]) 11/20/2016   Pelvic mass 11/15/2016   Past Medical History:  Diagnosis Date   Dysrhythmia    Genetic testing 03/28/2017   Multi-Cancer panel (83 genes) @ [facility] - No pathogenic mutations detected   High grade ovarian cancer ([place]) 11/20/2016   Pelvic mass  in female    Past Surgical History:  Procedure Laterality Date   APPENDECTOMY     LAPAROSCOPY N/A 03/14/2017   Procedure: LAPAROSCOPY OPERATIVE;  Surgeon: Mellody Drown, MD;  Location: [facility];  Service: Gynecology;  Laterality: N/A;   LAPAROTOMY N/A 03/14/2017   Procedure: LAPAROTOMY;  Surgeon: Mellody Drown, MD;  Location: [facility] OR[facility];  Service: Gynecology;  Laterality: N/A;   LYMPH NODE DISSECTION N/A 03/14/2017   Procedure: LYMPH NODE DISSECTION;  Surgeon: Mellody Drown, MD;  Location: [facility] OR[facility];  Service: Gynecology;  Laterality: N/A;   OMENTECTOMY N/A 03/14/2017   Procedure: OMENTECTOMY;   Surgeon: Mellody Drown, MD;  Location: [facility] OR[facility];  Service: Gynecology;  Laterality: N/A;   PORTA CATH INSERTION N/A 11/27/2016   Procedure: Katie Hill Cath Insertion;  Surgeon: Algernon Huxley, MD;  Location:  [facility];  Service: Cardiovascular;  Laterality: N/A;   Past Gynecologic History:  Menarche: 17 Last Menstrual Period: 24 years ago History of Abnormal pap: no Last pap: years ago She does not have regular medical cancer and has not has screening mammogram, colonoscopy, or Pap smears. She has not had an abnormal Pap.   OB History     Gravida  0   Para  0   Term  0   Preterm  0   AB  0   Living  0      SAB  0   IAB  0   Ectopic  0   Multiple  0   Live Births  0          Family History  Problem Relation Age of Onset   Throat cancer Cousin    Throat cancer Cousin    Leukemia Cousin    Social History   Socioeconomic History   Marital status: Married    Spouse name: Not on file   Number of children: Not on file   Years of education: Not on file   Highest education level: Not on file  Occupational History   Not on file  Tobacco Use   Smoking status: Never   Smokeless tobacco: Never  Vaping Use   Vaping Use: Never used  Substance and Sexual Activity   Alcohol use: Not Currently   Drug use: No   Sexual activity: Yes    Birth control/protection: Post-menopausal  Other Topics Concern   Not on file  Social History Narrative   Not on file   Social Determinants of Health   Financial Resource Strain: Not on file  Food Insecurity: Not on file  Transportation Needs: Not on file  Physical Activity: Not on file  Stress: Not on file  Social Connections: Not on file   Allergies  Allergen Reactions   Omeprazole Rash   Current Outpatient Medications on File Prior to Visit  Medication Sig Dispense Refill   hydrocortisone cream 0.5 % Apply 1 application topically 3 (three) times daily. 30 g 0   lidocaine-prilocaine (EMLA) cream Apply 1  application topically as needed. Apply small amount to port site at least 1 hour prior to it being accessed, cover with plastic wrap 30 g 1   vitamin B-12 (CYANOCOBALAMIN) 1000 MCG tablet Take 1 tablet (1,000 mcg total) by mouth daily. 90 tablet 4   olaparib (LYNPARZA) 150 MG tablet TAKE TWO TABLETS BY MOUTH TWICE A DAY (SWALLOW WHOLE. MAY TAKE WITH FOOD TO DECREASE NAUSEA AND VOMITING) (Patient not taking: No sig reported) 120 tablet 2   No current facility-administered medications on file prior to  visit.   Review of Systems General:  no complaints Skin: no complaints Eyes: no complaints HEENT: no complaints Breasts: no complaints Pulmonary: no complaints Cardiac: no complaints Gastrointestinal: no complaints Genitourinary/Sexual: no complaints Ob/Gyn: no complaints Musculoskeletal: no complaints Hematology: no complaints Neurologic/Psych: no complaints  Objective:  Physical Examination:  Today's Vitals   12/08/20 0905 12/08/20 0909  BP: 118/63   Pulse: 73   Resp: 20   Temp: (!) 96.9 F (36.1 C)   SpO2: 100%   Weight: 120 lb 12.8 oz (54.8 kg)   PainSc:  0-No pain   Body mass index is 18.1 kg/m.  ECOG Performance Status: 0 - no symptoms  GENERAL: Patient is a well appearing female in no acute distress. Thin build. Accompanied by husband.  HEENT:  Sclera clear. Anicteric NODES:  Negative axillary, supraclavicular, inguinal lymph node survery LUNGS:  Clear to auscultation bilaterally.   HEART:  Regular rate and rhythm.  ABDOMEN:  Soft, nontender.  No hernias, incisions well healed. No masses or ascites EXTREMITIES:  No peripheral edema. Atraumatic. No cyanosis SKIN:  Clear with no obvious rashes or skin changes.  NEURO:  Nonfocal. Well oriented.  Appropriate affect.  Pelvic: Exam chaperoned by CMA.  EGBUS: no lesions Cervix: surgically absent Vagina: no lesions, no discharge or bleeding. Shortened.  Uterus: surgically absent Adnexa: no palpable  masses Rectovaginal: deferred.   Lab Results  Component Value Date   WBC 3.2 (L) 12/08/2020   HGB 11.2 (L) 12/08/2020   HCT 32.7 (L) 12/08/2020   MCV 110.8 (H) 12/08/2020   PLT 115 (L) 12/08/2020    Assessment:  Lakaya Tolen is a 70 y.o. female diagnosed with high grade serous ovarian cancer (HRD) on CT directed node biopsy with partial bowel obstruction and extensive bulky adenopathy 8/18. s/p 4 cycles of carbo/taxol chemotherapy with dramatic decline in CA125 and improvement on CT scan. Interval debulking surgery to no gross residual on 03/14/2017.  Reinitiation of chemotherapy on 03/28/2017 with normalization of CA125.  Platinum-sensitive recurrence with normalization with re-induction of carbo-Taxol 03/15/2018-04/26/2018. On olaparib since 2/20 for HRD positive cancer. CA 125 rising but she remained asymptomatic. She continued until clear evidence of progression. Was not eligible for clinical trials at that time. She has now s/p cycle 1 of doxil-bev. Cycle 2 held due to neutropenia. CA 125 improving. Clinically asymptomatic. NED on pelvic exam today.  She does not have a germline mutation in BRCA1/2 or other related gene, but somatic tumor testing is positive for HRD.    Medical co-morbidities complicating care: prior abdominal surgery.  Plan:   Problem List Items Addressed This Visit       Endocrine   High grade ovarian cancer ([place]) - Primary   She will see Dr. Tasia Catchings today for evaluation and consideration of cycle 2 of doxil-bev. Tolerating it moderately with recent neutropenia. Neulasta has been added for cycle 2.   If she has significant progression of disease she would be treated with other FDA approved drugs including gemcitabine.  Also could re-evaluate her for possible clinical trial.  Return to [place] clinic in 3 months.   Beckey Rutter, DNP, AGNP-C [facility] [phone] (clinic)  CC:  Gae Dry,  MD [address] [place],  [place] [address]  Dr. Tasia Catchings

## 2020-12-08 NOTE — Patient Instructions (Addendum)
[facility]  Discharge Instructions: Thank you for choosing [facility] to provide your oncology and hematology care.  If you have a lab appointment with the [facility], please go directly to the [facility] and check in at the registration area.  Wear comfortable clothing and clothing appropriate for easy access to any Portacath or PICC line.   We strive to give you quality time with your provider. You may need to reschedule your appointment if you arrive late (15 or more minutes).  Arriving late affects you and other patients whose appointments are after yours.  Also, if you miss three or more appointments without notifying the office, you may be dismissed from the clinic at the provider's discretion.      For prescription refill requests, have your pharmacy contact our office and allow 72 hours for refills to be completed.    Today you received the following chemotherapy and/or immunotherapy agents ZIRABEV and DOXIL      To help prevent nausea and vomiting after your treatment, we encourage you to take your nausea medication as directed.  BELOW ARE SYMPTOMS THAT SHOULD BE REPORTED IMMEDIATELY: *FEVER GREATER THAN 100.4 F (38 C) OR HIGHER *CHILLS OR SWEATING *NAUSEA AND VOMITING THAT IS NOT CONTROLLED WITH YOUR NAUSEA MEDICATION *UNUSUAL SHORTNESS OF BREATH *UNUSUAL BRUISING OR BLEEDING *URINARY PROBLEMS (pain or burning when urinating, or frequent urination) *BOWEL PROBLEMS (unusual diarrhea, constipation, pain near the anus) TENDERNESS IN MOUTH AND THROAT WITH OR WITHOUT PRESENCE OF ULCERS (sore throat, sores in mouth, or a toothache) UNUSUAL RASH, SWELLING OR PAIN  UNUSUAL VAGINAL DISCHARGE OR ITCHING   Items with * indicate a potential emergency and should be followed up as soon as possible or go to the Emergency Department if any problems should occur.  Please show the CHEMOTHERAPY ALERT CARD or IMMUNOTHERAPY ALERT CARD at  check-in to the Emergency Department and triage nurse.  Should you have questions after your visit or need to cancel or reschedule your appointment, please contact [facility]  [phone] and follow the prompts.  Office hours are 8:00 a.m. to 4:30 p.m. Monday - Friday. Please note that voicemails left after 4:00 p.m. may not be returned until the following business day.  We are closed weekends and major holidays. You have access to a nurse at all times for urgent questions. Please call the main number to the clinic [phone] and follow the prompts.  For any non-urgent questions, you may also contact your provider using MyChart. We now offer e-Visits for anyone 32 and older to request care online for non-urgent symptoms. For details visit [email or web address].   Also download the MyChart app! Go to the app store, search "MyChart", open the app, select [facility], and log in with your MyChart username and password.  Due to Covid, a mask is required upon entering the hospital/clinic. If you do not have a mask, one will be given to you upon arrival. For doctor visits, patients may have 1 support person aged 52 or older with them. For treatment visits, patients cannot have anyone with them due to current Covid guidelines and our immunocompromised population.   Bevacizumab injection What is this medication? BEVACIZUMAB (be va SIZ yoo mab) is a monoclonal antibody. It is used to treat many types of cancer. This medicine may be used for other purposes; ask your health care provider or pharmacist if you have questions. COMMON BRAND NAME(S): Avastin, MVASI, Zirabev What should I  tell my care team before I take this medication? They need to know if you have any of these conditions: diabetes heart disease high blood pressure history of coughing up blood prior anthracycline chemotherapy (e.g., doxorubicin, daunorubicin, epirubicin) recent or ongoing radiation  therapy recent or planning to have surgery stroke an unusual or allergic reaction to bevacizumab, hamster proteins, mouse proteins, other medicines, foods, dyes, or preservatives pregnant or trying to get pregnant breast-feeding How should I use this medication? This medicine is for infusion into a vein. It is given by a health care professional in a hospital or clinic setting. Talk to your pediatrician regarding the use of this medicine in children. Special care may be needed. Overdosage: If you think you have taken too much of this medicine contact a poison control center or emergency room at once. NOTE: This medicine is only for you. Do not share this medicine with others. What if I miss a dose? It is important not to miss your dose. Call your doctor or health care professional if you are unable to keep an appointment. What may interact with this medication? Interactions are not expected. This list may not describe all possible interactions. Give your health care provider a list of all the medicines, herbs, non-prescription drugs, or dietary supplements you use. Also tell them if you smoke, drink alcohol, or use illegal drugs. Some items may interact with your medicine. What should I watch for while using this medication? Your condition will be monitored carefully while you are receiving this medicine. You will need important blood work and urine testing done while you are taking this medicine. This medicine may increase your risk to bruise or bleed. Call your doctor or health care professional if you notice any unusual bleeding. Before having surgery, talk to your health care provider to make sure it is ok. This drug can increase the risk of poor healing of your surgical site or wound. You will need to stop this drug for 28 days before surgery. After surgery, wait at least 28 days before restarting this drug. Make sure the surgical site or wound is healed enough before restarting this drug.  Talk to your health care provider if questions. Do not become pregnant while taking this medicine or for 6 months after stopping it. Women should inform their doctor if they wish to become pregnant or think they might be pregnant. There is a potential for serious side effects to an unborn child. Talk to your health care professional or pharmacist for more information. Do not breast-feed an infant while taking this medicine and for 6 months after the last dose. This medicine has caused ovarian failure in some women. This medicine may interfere with the ability to have a child. You should talk to your doctor or health care professional if you are concerned about your fertility. What side effects may I notice from receiving this medication? Side effects that you should report to your doctor or health care professional as soon as possible: allergic reactions like skin rash, itching or hives, swelling of the face, lips, or tongue chest pain or chest tightness chills coughing up blood high fever seizures severe constipation signs and symptoms of bleeding such as bloody or black, tarry stools; red or dark-brown urine; spitting up blood or brown material that looks like coffee grounds; red spots on the skin; unusual bruising or bleeding from the eye, gums, or nose signs and symptoms of a blood clot such as breathing problems; chest pain; severe, sudden headache;  pain, swelling, warmth in the leg signs and symptoms of a stroke like changes in vision; confusion; trouble speaking or understanding; severe headaches; sudden numbness or weakness of the face, arm or leg; trouble walking; dizziness; loss of balance or coordination stomach pain sweating swelling of legs or ankles vomiting weight gain Side effects that usually do not require medical attention (report to your doctor or health care professional if they continue or are bothersome): back pain changes in taste decreased appetite dry  skin nausea tiredness This list may not describe all possible side effects. Call your doctor for medical advice about side effects. You may report side effects to FDA at 1-800-FDA-1088. Where should I keep my medication? This drug is given in a hospital or clinic and will not be stored at home. NOTE: This sheet is a summary. It may not cover all possible information. If you have questions about this medicine, talk to your doctor, pharmacist, or health care provider.  2022 Elsevier/Gold Standard (2019-01-22 10:50:46)   Doxorubicin Liposomal injection What is this medication? LIPOSOMAL DOXORUBICIN (LIP oh som al dox oh ROO bi sin) is a chemotherapy drug. This medicine is used to treat many kinds of cancer like Kaposi's sarcoma, multiple myeloma, and ovarian cancer. This medicine may be used for other purposes; ask your health care provider or pharmacist if you have questions. COMMON BRAND NAME(S): Doxil, Lipodox What should I tell my care team before I take this medication? They need to know if you have any of these conditions: blood disorders heart disease infection (especially a virus infection such as chickenpox, cold sores, or herpes) liver disease recent or ongoing radiation therapy an unusual or allergic reaction to doxorubicin, other chemotherapy agents, soybeans, other medicines, foods, dyes, or preservatives pregnant or trying to get pregnant breast-feeding How should I use this medication? This drug is given as an infusion into a vein. It is administered in a hospital or clinic by a specially trained health care professional. If you have pain, swelling, burning or any unusual feeling around the site of your injection, tell your health care professional right away. Talk to your pediatrician regarding the use of this medicine in children. Special care may be needed. Overdosage: If you think you have taken too much of this medicine contact a poison control center or emergency room  at once. NOTE: This medicine is only for you. Do not share this medicine with others. What if I miss a dose? It is important not to miss your dose. Call your doctor or health care professional if you are unable to keep an appointment. What may interact with this medication? Do not take this medicine with any of the following medications: zidovudine This medicine may also interact with the following medications: medicines to increase blood counts like filgrastim, pegfilgrastim, sargramostim vaccines Talk to your doctor or health care professional before taking any of these medicines: acetaminophen aspirin ibuprofen ketoprofen naproxen This list may not describe all possible interactions. Give your health care provider a list of all the medicines, herbs, non-prescription drugs, or dietary supplements you use. Also tell them if you smoke, drink alcohol, or use illegal drugs. Some items may interact with your medicine. What should I watch for while using this medication? Your condition will be monitored carefully while you are receiving this medicine. You may need blood work done while you are taking this medicine. This drug may make you feel generally unwell. This is not uncommon, as chemotherapy can affect healthy cells as well as  cancer cells. Report any side effects. Continue your course of treatment even though you feel ill unless your doctor tells you to stop. Your urine may turn orange-red for a few days after your dose. This is not blood. If your urine is dark or brown, call your doctor. In some cases, you may be given additional medicines to help with side effects. Follow all directions for their use. Talk to your doctor about your risk of cancer. You may be more at risk for certain types of cancers if you take this medicine. Do not become pregnant while taking this medicine or for 6 months after stopping it. Women should inform their healthcare professional if they wish to become  pregnant or think they may be pregnant. Men should not father a child while taking this medicine and for 6 months after stopping it. There is a potential for serious side effects to an unborn child. Talk to your health care professional or pharmacist for more information. Do not breast-feed an infant while taking this medicine. This medicine has caused ovarian failure in some women. This medicine may make it more difficult to get pregnant. Talk to your healthcare professional if you are concerned about your fertility. This medicine has caused decreased sperm counts in some men. This may make it more difficult to father a child. Talk to your healthcare professional if you are concerned about your fertility. This medicine may cause a decrease in Co-Enzyme Q-10. You should make sure that you get enough Co-Enzyme Q-10 while you are taking this medicine. Discuss the foods you eat and the vitamins you take with your health care professional. What side effects may I notice from receiving this medication? Side effects that you should report to your doctor or health care professional as soon as possible: allergic reactions like skin rash, itching or hives, swelling of the face, lips, or tongue low blood counts - this medicine may decrease the number of white blood cells, red blood cells and platelets. You may be at increased risk for infections and bleeding. signs of hand-foot syndrome - tingling or burning, redness, flaking, swelling, small blisters, or small sores on the palms of your hands or the soles of your feet signs of infection - fever or chills, cough, sore throat, pain or difficulty passing urine signs of decreased platelets or bleeding - bruising, pinpoint red spots on the skin, black, tarry stools, blood in the urine signs of decreased red blood cells - unusually weak or tired, fainting spells, lightheadedness back pain, chills, facial flushing, fever, headache, tightness in the chest or throat  during the infusion breathing problems chest pain fast, irregular heartbeat mouth pain, redness, sores pain, swelling, redness at site where injected pain, tingling, numbness in the hands or feet swelling of ankles, feet, or hands vomiting Side effects that usually do not require medical attention (report to your doctor or health care professional if they continue or are bothersome): diarrhea hair loss loss of appetite nail discoloration or damage nausea red or watery eyes red colored urine stomach upset This list may not describe all possible side effects. Call your doctor for medical advice about side effects. You may report side effects to FDA at 1-800-FDA-1088. Where should I keep my medication? This drug is given in a hospital or clinic and will not be stored at home. NOTE: This sheet is a summary. It may not cover all possible information. If you have questions about this medicine, talk to your doctor, pharmacist, or health care provider.  2022 Elsevier/Gold Standard (2017-12-03 15:13:26)

## 2020-12-08 NOTE — Progress Notes (Signed)
[name]  Patient Care Team: Patient, No Pcp Per (Inactive) as PCP - General (General Practice) Clent Jacks, [name], [name], [name], MD as Referring Physician (Obstetrics and Gynecology)  CHIEF COMPLAINTS/PURPOSE OF Visit Follow up for chemotherapy for ovarian cancer.  HISTORY OF PRESENTING ILLNESS: Katie Hill 70 y.o. female presents for follow up of management of stage IIIC ovarian cancer. She underwent  Neoadjuvant chemotherapy of carbo and taxol x 4  # Patient had debulking surgery on 03/14/2017. She had a laparoscopy with conversion to laparotomy, total hysterectomy, with bilateral salpingo oophorectomy, right aortic lymph node dissection, omentectomy. Pathology showed small foci of residual disease in ovary.   Genetic testing negative for 83 genes on [facility]'s Multi-Cancer panel (ALK, APC, ATM, AXIN2, BAP1, BARD1, BLM, BMPR1A, BRCA1, BRCA2, BRIP1, CASR, CDC73, CDH1, CDK4, CDKN1B, CDKN1C, CDKN2A, CEBPA, CHEK2, CTNNA1, DICER1, DIS3L2, EGFR, EPCAM, FH, FLCN, GATA2, GPC3, GREM1, HOXB13, HRAS, KIT, MAX, MEN1, MET, MITF, MLH1, MSH2, MSH3, MSH6, MUTYH, NBN, NF1, NF2, NTHL1, PALB2, PDGFRA, PHOX2B, PMS2, POLD1, POLE, POT1, PRKAR1A, PTCH1, PTEN, RAD50, RAD51C, RAD51D, RB1, RECQL4, RET, RUNX1, SDHA, SDHAF2, SDHB, SDHC, SDHD, SMAD4, SMARCA4, SMARCB1, SMARCE1, STK11, SUFU, TERC, TERT, TMEM127, TP53, TSC1, TSC2, VHL, WRN, WT1).   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  Myraid testing negative for somatic BRACA1/2, positive for HRD.   # HRD positive, was referred to [facility] for clinical trials of Olarparib maintenance. She opted out.  #  Treatment:  Stage IIIC Ovarian cancer:  #s/p Carboplatin and taxol x 4 neoadjuvant chemotherapy followed by debulking surgery  03/14/2017 # 03/28/2017 S/p Adjuvant carbo and taxol x3   Local recurrence, CA125 one 46.3 03/15/2018-05/17/2018 Carboplatin and Taxol x 4. CA125 decrease from 49.7 to 6 after 4 cycles of treatment.  Started on Olarparib 333m BID on 05/17/2018.  # was seen by Dr. BFransisca Connorsfor further evaluation due to rising Ca1 25 and MRI abdomenPelvis on 11/05/2018 showed Interval progression of, now measuring 2.7 x 2.3 cm and concerning for progression of metastatic disease.retrocaval lymph node in the abdomen Olaparib was held for short period of time. Her CA125 trended down again.  Dr. BFransisca Connorsrecommending resume olaparib and continue monitor. If she has more significant progression of disease she may be a candidate for clinical trial or will be treated with other chemotherapy drugs including Doxil, gemcitabine and Avastin.  #10/06/2020, CT chest abdomen pelvis with contrast showed new lymph nodes in the prevascular space of the upper anterior mediastinum most consistent with metastasis.  Single new right lower lobe pulmonary nodule is concerning for early pulmonary metastasis.  Interval enlargement of the left periaortic retroperitoneal lymph node.  Stable adjacent aorto caval adenopathy.  10/29/2020 MUGA LVEF normal 10/29/2020 Stopped Olarparib  11/03/2020 start Doxil + bevacizumab  INTERVAL HISTORY 70y.o. female with above oncology history reviewed by me today presents for evaluation for chemotherapy for treatment of ovarian cancer  Patient was accompanied by her husband. Status post cycle 1 day 1 Doxil/bevacizumab.   Cycle 2 was delayed due to neutropenia. She feels well today.  Denies any fever, chills, dysuria, cough or shortness of breath. . Review of Systems  Constitutional:  Negative for appetite change, chills, fatigue and fever.  HENT:   Negative for hearing loss and voice change.   Eyes:  Negative for eye problems.  Respiratory:  Negative for chest tightness and cough.   Cardiovascular:   Negative for chest pain.  Gastrointestinal:  Negative for abdominal distention, abdominal pain and blood in stool.  Endocrine: Negative for hot flashes.  Genitourinary:  Negative for difficulty urinating and frequency.   Musculoskeletal:  Negative for arthralgias.  Skin:  Positive for rash. Negative for itching.  Neurological:  Negative for extremity weakness.  Hematological:  Negative for adenopathy.  Psychiatric/Behavioral:  Negative for confusion.     [name] MEDICAL HISTORY: Past Medical History:  Diagnosis Date   Dysrhythmia    Genetic testing 03/28/2017   Multi-Cancer panel (83 genes) @ [facility] - No pathogenic mutations detected   High grade ovarian cancer ([place]) 11/20/2016   Pelvic mass in female     SURGICAL HISTORY: Past Surgical History:  Procedure Laterality Date   APPENDECTOMY     LAPAROSCOPY N/A 03/14/2017   Procedure: LAPAROSCOPY OPERATIVE;  Surgeon: Mellody Drown, MD;  Location: [facility];  Service: Gynecology;  Laterality: N/A;   LAPAROTOMY N/A 03/14/2017   Procedure: LAPAROTOMY;  Surgeon: Mellody Drown, MD;  Location: [facility];  Service: Gynecology;  Laterality: N/A;   LYMPH NODE DISSECTION N/A 03/14/2017   Procedure: LYMPH NODE DISSECTION;  Surgeon: Mellody Drown, MD;  Location: [facility];  Service: Gynecology;  Laterality: N/A;   OMENTECTOMY N/A 03/14/2017   Procedure: OMENTECTOMY;  Surgeon: Mellody Drown, MD;  Location: [facility];  Service: Gynecology;  Laterality: N/A;   PORTA CATH INSERTION N/A 11/27/2016   Procedure: Glori Luis Cath Insertion;  Surgeon: Algernon Huxley, MD;  Location: [place] [facility] [facility];  Service: Cardiovascular;  Laterality: N/A;    SOCIAL HISTORY: Social History   Tobacco Use   Smoking status: Never   Smokeless tobacco: Never  Vaping Use   Vaping Use: Never used  Substance Use Topics   Alcohol use: Not Currently   Drug use: No     FAMILY HISTORY Family History  Problem Relation Age of Onset   Throat cancer Cousin    Throat cancer  Cousin    Leukemia Cousin     ALLERGIES:  is allergic to omeprazole.  MEDICATIONS:  Current Outpatient Medications  Medication Sig Dispense Refill   hydrocortisone cream 0.5 % Apply 1 application topically 3 (three) times daily. 30 g 0   lidocaine-prilocaine (EMLA) cream Apply 1 application topically as needed. Apply small amount to port site at least 1 hour prior to it being accessed, cover with plastic wrap 30 g 1   olaparib (LYNPARZA) 150 MG tablet TAKE TWO TABLETS BY MOUTH TWICE A DAY (SWALLOW WHOLE. MAY TAKE WITH FOOD TO DECREASE NAUSEA AND VOMITING) (Patient not taking: No sig reported) 120 tablet 2   vitamin B-12 (CYANOCOBALAMIN) 1000 MCG tablet Take 1 tablet (1,000 mcg total) by mouth daily. 90 tablet 4   No current facility-administered medications for this visit.    PHYSICAL EXAMINATION:  ECOG PERFORMANCE STATUS: 0 - Asymptomatic Vitals:   12/08/20 0940  BP: 118/63  Pulse: 73  Resp: 18  Temp: (!) 96.9 F (36.1 C)    Filed Weights   12/08/20 0940  Weight: 120 lb 12.8 oz (54.8 kg)     Physical Exam Constitutional:      General: She is not in acute distress.    Appearance: She is not diaphoretic.     Comments: Thin built, she walks independantly  HENT:     Head: Normocephalic and atraumatic.     Nose: Nose normal.     Mouth/Throat:  Pharynx: No oropharyngeal exudate.  Eyes:     General: No scleral icterus.    Pupils: Pupils are equal, round, and reactive to light.  Neck:     Vascular: No JVD.  Cardiovascular:     Rate and Rhythm: Normal rate and regular rhythm.     Heart sounds: No murmur heard. Pulmonary:     Effort: Pulmonary effort is normal. No respiratory distress.     Breath sounds: Normal breath sounds. No rales.  Chest:     Chest wall: No tenderness.  Abdominal:     General: Bowel sounds are normal. There is no distension.     Palpations: Abdomen is soft.     Tenderness: There is no abdominal tenderness.  Musculoskeletal:         General: Normal range of motion.     Cervical back: Normal range of motion and neck supple.  Lymphadenopathy:     Cervical: No cervical adenopathy.  Skin:    General: Skin is warm and dry.     Findings: No erythema or rash.     Comments: Right anterior medi port +   Neurological:     Mental Status: She is alert and oriented to person, place, and time.     Cranial Nerves: No cranial nerve deficit.     Motor: No abnormal muscle tone.     Coordination: Coordination normal.  Psychiatric:        Mood and Affect: Affect normal.        Judgment: Judgment normal.       LABORATORY DATA: I have personally reviewed the data as listed: CBC    Component Value Date/Time   WBC 3.2 (L) 12/08/2020 0842   RBC 2.95 (L) 12/08/2020 0842   HGB 11.2 (L) 12/08/2020 0842   HCT 32.7 (L) 12/08/2020 0842   PLT 115 (L) 12/08/2020 0842   MCV 110.8 (H) 12/08/2020 0842   MCH 38.0 (H) 12/08/2020 0842   MCHC 34.3 12/08/2020 0842   RDW 13.1 12/08/2020 0842   LYMPHSABS 0.9 12/08/2020 0842   MONOABS 0.5 12/08/2020 0842   EOSABS 0.1 12/08/2020 0842   BASOSABS 0.0 12/08/2020 0842   CMP Latest Ref Rng & Units 12/08/2020 12/01/2020 11/17/2020  Glucose 70 - 99 mg/dL 112(H) 90 86  BUN 8 - 23 mg/dL _0 Creatinine 0.44 - 1.00 mg/dL 0.57 0.59 0.52  Sodium 135 - 145 mmol/L 132(L) 130(L) 132(L)  Potassium 3.5 - 5.1 mmol/L 4.0 3.9 3.9  Chloride 98 - 111 mmol/L 102 101 102  CO2 22 - 32 mmol/L _1 Calcium 8.9 - 10.3 mg/dL 9.7 9.6 9.6  Total Protein 6.5 - 8.1 g/dL 7.1 7.4 7.0  Total Bilirubin 0.3 - 1.2 mg/dL 0.6 0.7 0.9  Alkaline Phos 38 - 126 U/L 64 59 63  AST 15 - 41 U/L _2 ALT 0 - 44 U/L _3 Pathology 11/16/2016 Surgical Pathology  CASE: [id number]  PATIENT: Chasty Keltz  Surgical Pathology Report   SPECIMEN SUBMITTED:  A. Retroperitoneal adenopathy, left  DIAGNOSIS:  A. LYMPH NODE, LEFT RETROPERITONEAL; CT-GUIDED CORE BIOPSY:  - METASTATIC HIGH-GRADE SEROUS CARCINOMA.   Pathology 03/14/2017   DIAGNOSIS:  A. OMENTUM; OMENTECTOMY:  - NO TUMOR SEEN.  - ONE NEGATIVE LYMPH NODE (0/1).   B.  RIGHT FALLOPIAN TUBE AND OVARY; SALPINGO-OOPHORECTOMY:  - SMALL FOCI OF HIGH GRADE SEROUS CARCINOMA INVOLVING THE OVARY.  - MARKED THERAPY RELATED CHANGE.  - NO TUMOR  SEEN IN THE FALLOPIAN TUBE.   C.  UTERUS, CERVIX, LEFT FALLOPIAN TUBE AND OVARY; HYSTERECTOMY AND LEFT  SALPINGO-OOPHORECTOMY:  - NABOTHIAN CYSTS.  - CYSTIC ATROPHY OF THE ENDOMETRIUM.  - SEROSAL ADHESIONS.  - UNREMARKABLE FALLOPIAN TUBE.  - OVARY SHOWING TREATMENT RELATED CHANGE.   D.  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    RADIOGRAPHIC STUDIES: I have personally reviewed the radiological images as listed and agreed with the findings in the report. NM Cardiac Muga Rest  Result Date: 10/29/2020 CLINICAL DATA:  Ovarian cancer, pre cardiotoxic chemotherapy EXAM: NUCLEAR MEDICINE CARDIAC BLOOD POOL IMAGING (MUGA) TECHNIQUE: Cardiac multi-gated acquisition was performed at rest following intravenous injection of Tc-16mlabeled red blood cells. RADIOPHARMACEUTICALS:  20.5 mCi Tc-99mertechnetate in-vitro labeled red blood cells IV COMPARISON:  None FINDINGS: Calculated LEFT ventricular ejection fraction is 66%, normal. Study was obtained at a cardiac rate of 63 bpm. Patient was rhythmic during imaging. Cine analysis of the LEFT ventricle in 3 projections demonstrates normal LEFT ventricular wall motion. IMPRESSION: Normal LEFT ventricular ejection fraction of 66%. Normal LV wall motion. Electronically Signed   By: MaLavonia Dana.D.   On: 10/29/2020 16:27   CT CHEST ABDOMEN PELVIS W CONTRAST  Result Date: 10/07/2020 CLINICAL DATA:  Follow-up malignant ovarian carcinoma. Initial diagnosis 2018. Chemotherapy completed 2018. Recurrence 2019 with chemotherapy. Surgical history of appendectomy a omentectomy and total hysterectomy.  EXAM: CT CHEST, ABDOMEN, AND PELVIS WITH CONTRAST TECHNIQUE: Multidetector CT imaging of the chest, abdomen and pelvis was performed following the standard protocol during bolus administration of intravenous contrast. CONTRAST:  7562mMNIPAQUE IOHEXOL 300 MG/ML  SOLN COMPARISON:  CT 06/30/2020 FINDINGS: CT CHEST FINDINGS Cardiovascular: No significant vascular findings. Normal heart size. No pericardial effusion. Port in the anterior chest wall with tip in distal SVC. Mediastinum/Nodes: New prevascular lymph node in the anterior mediastinum measuring 9 mm (image 23/series 2). Slightly more superior prevascular node measures 9 mm on image 19. High LEFT paratracheal node measures 11 mm on image 14. No supraclavicular adenopathy. Lungs/Pleura: Biapical pleuroparenchymal thickening. Round nodule in the RIGHT lower lobe measuring 4 mm (image 97/3) is new from comparison exam. Musculoskeletal: No aggressive osseous lesion. CT ABDOMEN AND PELVIS FINDINGS Hepatobiliary: No focal hepatic lesion. No biliary ductal dilatation. Gallbladder is normal. Common bile duct is normal. Pancreas: Pancreas is normal. No ductal dilatation. No pancreatic inflammation. Spleen: Normal spleen Adrenals/urinary tract: Adrenal glands and kidneys are normal. The ureters and bladder normal. Stomach/Bowel: Stomach small-bowel cecum normal. Colon and rectosigmoid colon normal. Vascular/Lymphatic: Periaortic retroperitoneal node LEFT aorta at the level of the renal vein measures 16 mm (image 69/2) compared to 11 mm. Elongated lymph node position between the IVC and aorta measures 2.3 x 3.9 cm compared with 2.6 x 3.8 cm for no significant change. No pelvic lymphadenopathy. Reproductive: Post hysterectomy.  Adnexa unremarkable Other: No peritoneal metastasis. Trace free fluid in the pelvis is unchanged (image 110/2) Musculoskeletal: No aggressive osseous lesion. IMPRESSION: Chest Impression: 1. New lymph nodes in the prevascular space of the upper  anterior mediastinum most consistent with lymphangitic spread of carcinoma. 2. Single new RIGHT lower lobe pulmonary nodule is concerning for early pulmonary metastasis. Abdomen / Pelvis Impression: 1. Interval enlargement of a LEFT periaortic retroperitoneal lymph node. Stable adjacent aortocaval adenopathy. 2. No peritoneal metastasis or ascites.  No liver metastasis. Electronically Signed   By: SteSuzy BouchardD.   On: 10/07/2020 08:38  ASSESSMENT/PLAN Cancer Staging Malignant neoplasm of ovary [facility]) Staging form: Ovary, Fallopian Tube, and Primary Peritoneal Carcinoma, AJCC 8th Edition - Clinical stage from 11/22/2016: Stage IIIC (cT3c, cN1b, cM0) - Signed by Earlie Server, MD on 11/22/2016 Stage used in treatment planning: Yes National guidelines used in treatment planning: Yes Type of national guideline used in treatment planning: NCCN  1. High grade ovarian cancer ([place])   2. Encounter for antineoplastic chemotherapy   3. Anemia associated with chemotherapy   4. Chemotherapy induced neutropenia ([facility])   5. Macrocytic anemia   6. Thrombocytopenia ([place])   : # Ovarian Cancer, local recurrence.  Currently on olaparib 300 mg twice daily. She tolerates well.  CA 125 18.7-->61.2-->111-->90.5-->69--> 54.8--> 29.1-->47-->71.2-->67.7->73.2-->82.8-->70.4-->66.4--> 58.4--> 64.9--> 56.9--> 42.2-->47.7-->54.4-->68.2-> 97-->168-->70.3 No available clinical trials at [facility] are reviewed and discussed with patient Proceed with cycle 2 day 1 Doxil/bevacizumab + onpro  #Chemotherapy-induced neutropenia, resolved. Add G-CSF support with on pro Neulasta next week for cycle 2 treatments. Discussed about rationale and potential side effects.  Recommend patient to take Claritin for 4 days.  Chronic anemia, hemoglobin stable at 11.2  Chemotherapy-induced.  Stable and continue to monitor.   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  Check vitamin B12.   # Skin Rash, topical steroid cream. Rx  sent  Port-A-Cath in place  RTF 2 weeks for lab MD  bevacizumab.    Earlie Server, MD, PhD Hematology Oncology [facility] Pager- [phone] 12/08/20

## 2020-12-22 ENCOUNTER — Inpatient Hospital Stay: Payer: Medicare Other | Attending: Oncology

## 2020-12-22 ENCOUNTER — Encounter: Payer: Self-pay | Admitting: Oncology

## 2020-12-22 ENCOUNTER — Inpatient Hospital Stay: Payer: Medicare Other

## 2020-12-22 ENCOUNTER — Inpatient Hospital Stay (HOSPITAL_BASED_OUTPATIENT_CLINIC_OR_DEPARTMENT_OTHER): Payer: Medicare Other | Admitting: Oncology

## 2020-12-22 VITALS — BP 116/58 | HR 67 | Temp 98.8°F | Resp 16 | Wt 121.0 lb

## 2020-12-22 DIAGNOSIS — T451X5A Adverse effect of antineoplastic and immunosuppressive drugs, initial encounter: Secondary | ICD-10-CM | POA: Diagnosis not present

## 2020-12-22 DIAGNOSIS — R21 Rash and other nonspecific skin eruption: Secondary | ICD-10-CM

## 2020-12-22 DIAGNOSIS — C569 Malignant neoplasm of unspecified ovary: Secondary | ICD-10-CM

## 2020-12-22 DIAGNOSIS — D701 Agranulocytosis secondary to cancer chemotherapy: Secondary | ICD-10-CM | POA: Diagnosis not present

## 2020-12-22 DIAGNOSIS — D6481 Anemia due to antineoplastic chemotherapy: Secondary | ICD-10-CM

## 2020-12-22 DIAGNOSIS — D539 Nutritional anemia, unspecified: Secondary | ICD-10-CM

## 2020-12-22 DIAGNOSIS — Z5111 Encounter for antineoplastic chemotherapy: Secondary | ICD-10-CM

## 2020-12-22 DIAGNOSIS — Z5112 Encounter for antineoplastic immunotherapy: Secondary | ICD-10-CM | POA: Insufficient documentation

## 2020-12-22 DIAGNOSIS — Z5189 Encounter for other specified aftercare: Secondary | ICD-10-CM | POA: Diagnosis not present

## 2020-12-22 LAB — CBC WITH DIFFERENTIAL/PLATELET
Abs Immature Granulocytes: 0.08 10*3/uL — ABNORMAL HIGH (ref 0.00–0.07)
Basophils Absolute: 0.1 10*3/uL (ref 0.0–0.1)
Basophils Relative: 2 %
Eosinophils Absolute: 0.1 10*3/uL (ref 0.0–0.5)
Eosinophils Relative: 3 %
HCT: 28.1 % — ABNORMAL LOW (ref 36.0–46.0)
Hemoglobin: 9.5 g/dL — ABNORMAL LOW (ref 12.0–15.0)
Immature Granulocytes: 3 %
Lymphocytes Relative: 25 %
Lymphs Abs: 0.8 10*3/uL (ref 0.7–4.0)
MCH: 36.5 pg — ABNORMAL HIGH (ref 26.0–34.0)
MCHC: 33.8 g/dL (ref 30.0–36.0)
MCV: 108.1 fL — ABNORMAL HIGH (ref 80.0–100.0)
Monocytes Absolute: 0.3 10*3/uL (ref 0.1–1.0)
Monocytes Relative: 8 %
Neutro Abs: 1.8 10*3/uL (ref 1.7–7.7)
Neutrophils Relative %: 59 %
Platelets: 180 10*3/uL (ref 150–400)
RBC: 2.6 MIL/uL — ABNORMAL LOW (ref 3.87–5.11)
RDW: 13.7 % (ref 11.5–15.5)
Smear Review: NORMAL
WBC: 3 10*3/uL — ABNORMAL LOW (ref 4.0–10.5)
nRBC: 0 % (ref 0.0–0.2)

## 2020-12-22 LAB — COMPREHENSIVE METABOLIC PANEL
ALT: 8 U/L (ref 0–44)
AST: 15 U/L (ref 15–41)
Albumin: 3.6 g/dL (ref 3.5–5.0)
Alkaline Phosphatase: 60 U/L (ref 38–126)
Anion gap: 6 (ref 5–15)
BUN: 10 mg/dL (ref 8–23)
CO2: 24 mmol/L (ref 22–32)
Calcium: 9.4 mg/dL (ref 8.9–10.3)
Chloride: 102 mmol/L (ref 98–111)
Creatinine, Ser: 0.47 mg/dL (ref 0.44–1.00)
GFR, Estimated: >60 mL/min (ref >60)
Glucose, Bld: 96 mg/dL (ref 70–99)
Potassium: 4 mmol/L (ref 3.5–5.1)
Sodium: 132 mmol/L — ABNORMAL LOW (ref 135–145)
Total Bilirubin: 0.3 mg/dL (ref 0.3–1.2)
Total Protein: 6.7 g/dL (ref 6.5–8.1)

## 2020-12-22 LAB — PROTEIN, URINE, RANDOM: Total Protein, Urine: <6 mg/dL

## 2020-12-22 MED ORDER — SODIUM CHLORIDE 0.9% FLUSH
10.0000 mL | INTRAVENOUS | Status: DC | PRN
  Filled 2020-12-22: qty 10

## 2020-12-22 MED ORDER — SODIUM CHLORIDE 0.9 % IV SOLN
Freq: Once | INTRAVENOUS | Status: AC
  Filled 2020-12-22: qty 250

## 2020-12-22 MED ORDER — ONDANSETRON HCL 8 MG PO TABS: 8.0000 mg | ORAL_TABLET | Freq: Three times a day (TID) | ORAL | 1 refills | Status: DC | PRN

## 2020-12-22 MED ORDER — HEPARIN SOD (PORK) LOCK FLUSH 100 UNIT/ML IV SOLN
500.0000 [IU] | Freq: Once | INTRAVENOUS | Status: AC | PRN
  Administered 2020-12-22: 500 [IU]
  Filled 2020-12-22: qty 5

## 2020-12-22 MED ORDER — HEPARIN SOD (PORK) LOCK FLUSH 100 UNIT/ML IV SOLN
INTRAVENOUS | Status: AC
  Filled 2020-12-22: qty 5

## 2020-12-22 MED ORDER — SODIUM CHLORIDE 0.9 % IV SOLN
10.0000 mg/kg | Freq: Once | INTRAVENOUS | Status: AC
  Administered 2020-12-22: 500 mg via INTRAVENOUS
  Filled 2020-12-22: qty 16

## 2020-12-22 NOTE — Progress Notes (Signed)
[name] Follow up visit  Patient Care Team: Patient, No Pcp Per (Inactive) as PCP - General (General Practice) Clent Jacks, [name], [name], [name], MD as Referring Physician (Obstetrics and Gynecology)  CHIEF COMPLAINTS/PURPOSE OF Visit Follow up for chemotherapy for ovarian cancer.  HISTORY OF PRESENTING ILLNESS: Katie Hill 70 y.o. female presents for follow up of management of stage IIIC ovarian cancer. She underwent  Neoadjuvant chemotherapy of carbo and taxol x 4  # Patient had debulking surgery on 03/14/2017. She had a laparoscopy with conversion to laparotomy, total hysterectomy, with bilateral salpingo oophorectomy, right aortic lymph node dissection, omentectomy. Pathology showed small foci of residual disease in ovary.   Genetic testing negative for 83 genes on [facility]'s Multi-Cancer panel (ALK, APC, ATM, AXIN2, BAP1, BARD1, BLM, BMPR1A, BRCA1, BRCA2, BRIP1, CASR, CDC73, CDH1, CDK4, CDKN1B, CDKN1C, CDKN2A, CEBPA, CHEK2, CTNNA1, DICER1, DIS3L2, EGFR, EPCAM, FH, FLCN, GATA2, GPC3, GREM1, HOXB13, HRAS, KIT, MAX, MEN1, MET, MITF, MLH1, MSH2, MSH3, MSH6, MUTYH, NBN, NF1, NF2, NTHL1, PALB2, PDGFRA, PHOX2B, PMS2, POLD1, POLE, POT1, PRKAR1A, PTCH1, PTEN, RAD50, RAD51C, RAD51D, RB1, RECQL4, RET, RUNX1, SDHA, SDHAF2, SDHB, SDHC, SDHD, SMAD4, SMARCA4, SMARCB1, SMARCE1, STK11, SUFU, TERC, TERT, TMEM127, TP53, TSC1, TSC2, VHL, WRN, WT1).   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  Myraid testing negative for somatic BRACA1/2, positive for HRD.   # HRD positive, was referred to [facility] for clinical trials of Olarparib maintenance. She opted out.  #  Treatment:  Stage IIIC Ovarian cancer:  #s/p Carboplatin and taxol x 4 neoadjuvant chemotherapy followed by debulking surgery  03/14/2017 # 03/28/2017 S/p Adjuvant carbo and taxol x3   Local recurrence, CA125 one 46.3 03/15/2018-05/17/2018 Carboplatin and Taxol x 4. CA125 decrease from 49.7 to 6 after 4 cycles of treatment.  Started on Olarparib 365m BID on 05/17/2018.  # was seen by Dr. BFransisca Connorsfor further evaluation due to rising Ca1 25 and MRI abdomenPelvis on 11/05/2018 showed Interval progression of, now measuring 2.7 x 2.3 cm and concerning for progression of metastatic disease.retrocaval lymph node in the abdomen Olaparib was held for short period of time. Her CA125 trended down again.  Dr. BFransisca Connorsrecommending resume olaparib and continue monitor. If she has more significant progression of disease she may be a candidate for clinical trial or will be treated with other chemotherapy drugs including Doxil, gemcitabine and Avastin.  #10/06/2020, CT chest abdomen pelvis with contrast showed new lymph nodes in the prevascular space of the upper anterior mediastinum most consistent with metastasis.  Single new right lower lobe pulmonary nodule is concerning for early pulmonary metastasis.  Interval enlargement of the left periaortic retroperitoneal lymph node.  Stable adjacent aorto caval adenopathy.  10/29/2020 MUGA LVEF normal 10/29/2020 Stopped Olarparib  11/03/2020 start Doxil + bevacizumab  INTERVAL HISTORY 70y.o. female with above oncology history reviewed by me today presents for evaluation for chemotherapy for treatment of ovarian cancer  Patient was accompanied by her husband. Patient is currently on chemotherapy with Doxil/bevacizumab.   Patient reports feeling well. She had G-CSF prophylaxis with current cycle.  Reports nausea and he requests Zofran to be reviewed.  Otherwise no new complaints. Rash has improved.  She has some mild rash on the anterior chest wall.  Review of Systems  Constitutional:  Negative for appetite change, chills, fatigue and fever.  HENT:  Negative for hearing loss and voice change.    Eyes:  Negative for eye problems.  Respiratory:  Negative for chest tightness and cough.   Cardiovascular:  Negative for chest pain.  Gastrointestinal:  Negative for abdominal distention, abdominal pain and blood in stool.  Endocrine: Negative for hot flashes.  Genitourinary:  Negative for difficulty urinating and frequency.   Musculoskeletal:  Negative for arthralgias.  Skin:  Positive for rash. Negative for itching.  Neurological:  Negative for extremity weakness.  Hematological:  Negative for adenopathy.  Psychiatric/Behavioral:  Negative for confusion.     [name] MEDICAL HISTORY: Past Medical History:  Diagnosis Date   Dysrhythmia    Genetic testing 03/28/2017   Multi-Cancer panel (83 genes) @ [facility] - No pathogenic mutations detected   High grade ovarian cancer ([place]) 11/20/2016   Pelvic mass in female     SURGICAL HISTORY: Past Surgical History:  Procedure Laterality Date   APPENDECTOMY     LAPAROSCOPY N/A 03/14/2017   Procedure: LAPAROSCOPY OPERATIVE;  Surgeon: Mellody Drown, MD;  Location: [facility];  Service: Gynecology;  Laterality: N/A;   LAPAROTOMY N/A 03/14/2017   Procedure: LAPAROTOMY;  Surgeon: Mellody Drown, MD;  Location: [facility];  Service: Gynecology;  Laterality: N/A;   LYMPH NODE DISSECTION N/A 03/14/2017   Procedure: LYMPH NODE DISSECTION;  Surgeon: Mellody Drown, MD;  Location: [facility];  Service: Gynecology;  Laterality: N/A;   OMENTECTOMY N/A 03/14/2017   Procedure: OMENTECTOMY;  Surgeon: Mellody Drown, MD;  Location: [facility];  Service: Gynecology;  Laterality: N/A;   PORTA CATH INSERTION N/A 11/27/2016   Procedure: Glori Luis Cath Insertion;  Surgeon: Algernon Huxley, MD;  Location: [place] [facility];  Service: Cardiovascular;  Laterality: N/A;    SOCIAL HISTORY: Social History   Tobacco Use   Smoking status: Never   Smokeless tobacco: Never  Vaping Use   Vaping Use: Never used  Substance Use Topics   Alcohol use: Not Currently   Drug use: No      FAMILY HISTORY Family History  Problem Relation Age of Onset   Throat cancer Cousin    Throat cancer Cousin    Leukemia Cousin     ALLERGIES:  is allergic to omeprazole.  MEDICATIONS:  Current Outpatient Medications  Medication Sig Dispense Refill   hydrocortisone cream 0.5 % Apply 1 application topically 3 (three) times daily. 30 g 0   lidocaine-prilocaine (EMLA) cream Apply 1 application topically as needed. Apply small amount to port site at least 1 hour prior to it being accessed, cover with plastic wrap 30 g 1   ondansetron (ZOFRAN) 8 MG tablet Take 1 tablet (8 mg total) by mouth every 8 (eight) hours as needed for nausea or vomiting. 60 tablet 1   No current facility-administered medications for this visit.    PHYSICAL EXAMINATION:  ECOG PERFORMANCE STATUS: 0 - Asymptomatic Vitals:   12/22/20 1121  BP: (!) 116/58  Pulse: 67  Resp: 16  Temp: 98.8 F (37.1 C)  SpO2: 100%    Filed Weights   12/22/20 1121  Weight: 121 lb (54.9 kg)     Physical Exam Constitutional:      General: She is not in acute distress.    Appearance: She is not diaphoretic.     Comments: Thin built, she walks independantly  HENT:     Head: Normocephalic and atraumatic.     Nose: Nose normal.     Mouth/Throat:     Pharynx: No oropharyngeal exudate.  Eyes:  General: No scleral icterus.    Pupils: Pupils are equal, round, and reactive to light.  Neck:     Vascular: No JVD.  Cardiovascular:     Rate and Rhythm: Normal rate and regular rhythm.     Heart sounds: No murmur heard. Pulmonary:     Effort: Pulmonary effort is normal. No respiratory distress.     Breath sounds: Normal breath sounds. No rales.  Chest:     Chest wall: No tenderness.  Abdominal:     General: Bowel sounds are normal. There is no distension.     Palpations: Abdomen is soft.     Tenderness: There is no abdominal tenderness.  Musculoskeletal:        General: Normal range of motion.     Cervical back:  Normal range of motion and neck supple.  Lymphadenopathy:     Cervical: No cervical adenopathy.  Skin:    General: Skin is warm and dry.     Findings: No erythema or rash.     Comments: Right anterior medi port +   Neurological:     Mental Status: She is alert and oriented to person, place, and time.     Cranial Nerves: No cranial nerve deficit.     Motor: No abnormal muscle tone.     Coordination: Coordination normal.  Psychiatric:        Mood and Affect: Affect normal.        Judgment: Judgment normal.       LABORATORY DATA: I have personally reviewed the data as listed: CBC    Component Value Date/Time   WBC 3.0 (L) 12/22/2020 1049   RBC 2.60 (L) 12/22/2020 1049   HGB 9.5 (L) 12/22/2020 1049   HCT 28.1 (L) 12/22/2020 1049   PLT 180 12/22/2020 1049   MCV 108.1 (H) 12/22/2020 1049   MCH 36.5 (H) 12/22/2020 1049   MCHC 33.8 12/22/2020 1049   RDW 13.7 12/22/2020 1049   LYMPHSABS 0.8 12/22/2020 1049   MONOABS 0.3 12/22/2020 1049   EOSABS 0.1 12/22/2020 1049   BASOSABS 0.1 12/22/2020 1049   CMP Latest Ref Rng & Units 12/22/2020 12/08/2020 12/01/2020  Glucose 70 - 99 mg/dL 96 112(H) 90  BUN 8 - 23 mg/dL _0 Creatinine 0.44 - 1.00 mg/dL 0.47 0.57 0.59  Sodium 135 - 145 mmol/L 132(L) 132(L) 130(L)  Potassium 3.5 - 5.1 mmol/L 4.0 4.0 3.9  Chloride 98 - 111 mmol/L 102 102 101  CO2 22 - 32 mmol/L _1 Calcium 8.9 - 10.3 mg/dL 9.4 9.7 9.6  Total Protein 6.5 - 8.1 g/dL 6.7 7.1 7.4  Total Bilirubin 0.3 - 1.2 mg/dL 0.3 0.6 0.7  Alkaline Phos 38 - 126 U/L 60 64 59  AST 15 - 41 U/L _2 ALT 0 - 44 U/L _3 Pathology 11/16/2016 Surgical Pathology  CASE: [id number]  PATIENT: Tomeko Umbaugh  Surgical Pathology Report   SPECIMEN SUBMITTED:  A. Retroperitoneal adenopathy, left  DIAGNOSIS:  A. LYMPH NODE, LEFT RETROPERITONEAL; CT-GUIDED CORE BIOPSY:  - METASTATIC HIGH-GRADE SEROUS CARCINOMA.  Pathology 03/14/2017   DIAGNOSIS:  A. OMENTUM; OMENTECTOMY:   - NO TUMOR SEEN.  - ONE NEGATIVE LYMPH NODE (0/1).   B.  RIGHT FALLOPIAN TUBE AND OVARY; SALPINGO-OOPHORECTOMY:  - SMALL FOCI OF HIGH GRADE SEROUS CARCINOMA INVOLVING THE OVARY.  - MARKED THERAPY RELATED CHANGE.  - NO TUMOR SEEN IN THE FALLOPIAN TUBE.   C.  UTERUS, CERVIX,  LEFT FALLOPIAN TUBE AND OVARY; HYSTERECTOMY AND LEFT  SALPINGO-OOPHORECTOMY:  - NABOTHIAN CYSTS.  - CYSTIC ATROPHY OF THE ENDOMETRIUM.  - SEROSAL ADHESIONS.  - UNREMARKABLE FALLOPIAN TUBE.  - OVARY SHOWING TREATMENT RELATED CHANGE.   D.  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    RADIOGRAPHIC STUDIES: I have personally reviewed the radiological images as listed and agreed with the findings in the report. NM Cardiac Muga Rest  Result Date: 10/29/2020 CLINICAL DATA:  Ovarian cancer, pre cardiotoxic chemotherapy EXAM: NUCLEAR MEDICINE CARDIAC BLOOD POOL IMAGING (MUGA) TECHNIQUE: Cardiac multi-gated acquisition was performed at rest following intravenous injection of Tc-41mlabeled red blood cells. RADIOPHARMACEUTICALS:  20.5 mCi Tc-93mertechnetate in-vitro labeled red blood cells IV COMPARISON:  None FINDINGS: Calculated LEFT ventricular ejection fraction is 66%, normal. Study was obtained at a cardiac rate of 63 bpm. Patient was rhythmic during imaging. Cine analysis of the LEFT ventricle in 3 projections demonstrates normal LEFT ventricular wall motion. IMPRESSION: Normal LEFT ventricular ejection fraction of 66%. Normal LV wall motion. Electronically Signed   By: MaLavonia Dana.D.   On: 10/29/2020 16:27   CT CHEST ABDOMEN PELVIS W CONTRAST  Result Date: 10/07/2020 CLINICAL DATA:  Follow-up malignant ovarian carcinoma. Initial diagnosis 2018. Chemotherapy completed 2018. Recurrence 2019 with chemotherapy. Surgical history of appendectomy a omentectomy and total hysterectomy. EXAM: CT CHEST, ABDOMEN, AND PELVIS WITH CONTRAST TECHNIQUE:  Multidetector CT imaging of the chest, abdomen and pelvis was performed following the standard protocol during bolus administration of intravenous contrast. CONTRAST:  7592mMNIPAQUE IOHEXOL 300 MG/ML  SOLN COMPARISON:  CT 06/30/2020 FINDINGS: CT CHEST FINDINGS Cardiovascular: No significant vascular findings. Normal heart size. No pericardial effusion. Port in the anterior chest wall with tip in distal SVC. Mediastinum/Nodes: New prevascular lymph node in the anterior mediastinum measuring 9 mm (image 23/series 2). Slightly more superior prevascular node measures 9 mm on image 19. High LEFT paratracheal node measures 11 mm on image 14. No supraclavicular adenopathy. Lungs/Pleura: Biapical pleuroparenchymal thickening. Round nodule in the RIGHT lower lobe measuring 4 mm (image 97/3) is new from comparison exam. Musculoskeletal: No aggressive osseous lesion. CT ABDOMEN AND PELVIS FINDINGS Hepatobiliary: No focal hepatic lesion. No biliary ductal dilatation. Gallbladder is normal. Common bile duct is normal. Pancreas: Pancreas is normal. No ductal dilatation. No pancreatic inflammation. Spleen: Normal spleen Adrenals/urinary tract: Adrenal glands and kidneys are normal. The ureters and bladder normal. Stomach/Bowel: Stomach small-bowel cecum normal. Colon and rectosigmoid colon normal. Vascular/Lymphatic: Periaortic retroperitoneal node LEFT aorta at the level of the renal vein measures 16 mm (image 69/2) compared to 11 mm. Elongated lymph node position between the IVC and aorta measures 2.3 x 3.9 cm compared with 2.6 x 3.8 cm for no significant change. No pelvic lymphadenopathy. Reproductive: Post hysterectomy.  Adnexa unremarkable Other: No peritoneal metastasis. Trace free fluid in the pelvis is unchanged (image 110/2) Musculoskeletal: No aggressive osseous lesion. IMPRESSION: Chest Impression: 1. New lymph nodes in the prevascular space of the upper anterior mediastinum most consistent with lymphangitic spread of  carcinoma. 2. Single new RIGHT lower lobe pulmonary nodule is concerning for early pulmonary metastasis. Abdomen / Pelvis Impression: 1. Interval enlargement of a LEFT periaortic retroperitoneal lymph node. Stable adjacent aortocaval adenopathy. 2. No peritoneal metastasis or ascites.  No liver metastasis. Electronically Signed   By: SteSuzy BouchardD.   On: 10/07/2020 08:38     ASSESSMENT/PLAN Cancer Staging Malignant neoplasm of ovary ([place]CC[place] form: Ovary,  Fallopian Tube, and Primary Peritoneal Carcinoma, AJCC 8th Edition - Clinical stage from 11/22/2016: Stage IIIC (cT3c, cN1b, cM0) - Signed by Earlie Server, MD on 11/22/2016 Stage used in treatment planning: Yes National guidelines used in treatment planning: Yes Type of national guideline used in treatment planning: NCCN  1. High grade ovarian cancer ([place])   2. Encounter for antineoplastic chemotherapy   3. Anemia associated with chemotherapy   4. Chemotherapy induced neutropenia (HCC)   5. Macrocytic anemia   6. Skin rash   : # Ovarian Cancer, local recurrence.  Currently on olaparib 300 mg twice daily. She tolerates well.  CA 125 18.7-->61.2-->111-->90.5-->69--> 54.8--> 29.1-->47-->71.2-->67.7->73.2-->82.8-->70.4-->66.4--> 58.4--> 64.9--> 56.9--> 42.2-->47.7-->54.4-->68.2-> 97-->168-->70.3 No available clinical trials at [facility] reviewed and discussed with patient. To proceed with cycle 2-day 15 bevacizumab  #Chemotherapy-induced neutropenia, resolved. Add G-CSF support with on pro Neulasta   Chronic anemia, hemoglobin decreased at 9.5 chemotherapy-induced.  Stable and continue to monitor.   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  B12 level is normal.  Hold off B12 supplementation.   # Skin Rash, topical steroid cream.   Port-A-Cath in place  RTF 2 weeks for lab MD Doxil/ bevacizumab.    Earlie Server, MD, PhD Hematology Oncology [facility] Pager- [phone] 12/22/20

## 2020-12-22 NOTE — Progress Notes (Signed)
CA-125 today per Dr. Tasia Catchings

## 2020-12-22 NOTE — Progress Notes (Signed)
Pt in for follow up, request nausea medication. Pt reports appetite has been decreased but is getting better.

## 2020-12-22 NOTE — Patient Instructions (Signed)
[facility]  Discharge Instructions: Thank you for choosing [facility] to provide your oncology and hematology care.  If you have a lab appointment with the Cancer Center, please go directly to the Cancer Center and check in at the registration area.  Wear comfortable clothing and clothing appropriate for easy access to any Portacath or PICC line.   We strive to give you quality time with your provider. You may need to reschedule your appointment if you arrive late (15 or more minutes).  Arriving late affects you and other patients whose appointments are after yours.  Also, if you miss three or more appointments without notifying the office, you may be dismissed from the clinic at the provider's discretion.      For prescription refill requests, have your pharmacy contact our office and allow 72 hours for refills to be completed.    Today you received the following chemotherapy and/or immunotherapy agents bevacizumab      To help prevent nausea and vomiting after your treatment, we encourage you to take your nausea medication as directed.  BELOW ARE SYMPTOMS THAT SHOULD BE REPORTED IMMEDIATELY: *FEVER GREATER THAN 100.4 F (38 C) OR HIGHER *CHILLS OR SWEATING *NAUSEA AND VOMITING THAT IS NOT CONTROLLED WITH YOUR NAUSEA MEDICATION *UNUSUAL SHORTNESS OF BREATH *UNUSUAL BRUISING OR BLEEDING *URINARY PROBLEMS (pain or burning when urinating, or frequent urination) *BOWEL PROBLEMS (unusual diarrhea, constipation, pain near the anus) TENDERNESS IN MOUTH AND THROAT WITH OR WITHOUT PRESENCE OF ULCERS (sore throat, sores in mouth, or a toothache) UNUSUAL RASH, SWELLING OR PAIN  UNUSUAL VAGINAL DISCHARGE OR ITCHING   Items with * indicate a potential emergency and should be followed up as soon as possible or go to the Emergency Department if any problems should occur.  Please show the CHEMOTHERAPY ALERT CARD or IMMUNOTHERAPY ALERT CARD at check-in  to the Emergency Department and triage nurse.  Should you have questions after your visit or need to cancel or reschedule your appointment, please contact [facility]  [phone] and follow the prompts.  Office hours are 8:00 a.m. to 4:30 p.m. Monday - Friday. Please note that voicemails left after 4:00 p.m. may not be returned until the following business day.  We are closed weekends and major holidays. You have access to a nurse at all times for urgent questions. Please call the main number to the clinic [phone] and follow the prompts.  For any non-urgent questions, you may also contact your provider using MyChart. We now offer e-Visits for anyone 18 and older to request care online for non-urgent symptoms. For details visit [email or web address].   Also download the MyChart app! Go to the app store, search "MyChart", open the app, select [facility], and log in with your MyChart username and password.  Due to Covid, a mask is required upon entering the hospital/clinic. If you do not have a mask, one will be given to you upon arrival. For doctor visits, patients may have 1 support person aged 18 or older with them. For treatment visits, patients cannot have anyone with them due to current Covid guidelines and our immunocompromised population. Bevacizumab injection What is this medication? BEVACIZUMAB (be va SIZ yoo mab) is a monoclonal antibody. It is used to treat many types of cancer. This medicine may be used for other purposes; ask your health care provider or pharmacist if you have questions. COMMON BRAND NAME(S): Avastin, MVASI, Zirabev What should I tell my care team   before I take this medication? They need to know if you have any of these conditions: diabetes heart disease high blood pressure history of coughing up blood prior anthracycline chemotherapy (e.g., doxorubicin, daunorubicin, epirubicin) recent or ongoing radiation therapy recent  or planning to have surgery stroke an unusual or allergic reaction to bevacizumab, hamster proteins, mouse proteins, other medicines, foods, dyes, or preservatives pregnant or trying to get pregnant breast-feeding How should I use this medication? This medicine is for infusion into a vein. It is given by a health care professional in a hospital or clinic setting. Talk to your pediatrician regarding the use of this medicine in children. Special care may be needed. Overdosage: If you think you have taken too much of this medicine contact a poison control center or emergency room at once. NOTE: This medicine is only for you. Do not share this medicine with others. What if I miss a dose? It is important not to miss your dose. Call your doctor or health care professional if you are unable to keep an appointment. What may interact with this medication? Interactions are not expected. This list may not describe all possible interactions. Give your health care provider a list of all the medicines, herbs, non-prescription drugs, or dietary supplements you use. Also tell them if you smoke, drink alcohol, or use illegal drugs. Some items may interact with your medicine. What should I watch for while using this medication? Your condition will be monitored carefully while you are receiving this medicine. You will need important blood work and urine testing done while you are taking this medicine. This medicine may increase your risk to bruise or bleed. Call your doctor or health care professional if you notice any unusual bleeding. Before having surgery, talk to your health care provider to make sure it is ok. This drug can increase the risk of poor healing of your surgical site or wound. You will need to stop this drug for 28 days before surgery. After surgery, wait at least 28 days before restarting this drug. Make sure the surgical site or wound is healed enough before restarting this drug. Talk to your  health care provider if questions. Do not become pregnant while taking this medicine or for 6 months after stopping it. Women should inform their doctor if they wish to become pregnant or think they might be pregnant. There is a potential for serious side effects to an unborn child. Talk to your health care professional or pharmacist for more information. Do not breast-feed an infant while taking this medicine and for 6 months after the last dose. This medicine has caused ovarian failure in some women. This medicine may interfere with the ability to have a child. You should talk to your doctor or health care professional if you are concerned about your fertility. What side effects may I notice from receiving this medication? Side effects that you should report to your doctor or health care professional as soon as possible: allergic reactions like skin rash, itching or hives, swelling of the face, lips, or tongue chest pain or chest tightness chills coughing up blood high fever seizures severe constipation signs and symptoms of bleeding such as bloody or black, tarry stools; red or dark-brown urine; spitting up blood or brown material that looks like coffee grounds; red spots on the skin; unusual bruising or bleeding from the eye, gums, or nose signs and symptoms of a blood clot such as breathing problems; chest pain; severe, sudden headache; pain, swelling, warmth in   the leg signs and symptoms of a stroke like changes in vision; confusion; trouble speaking or understanding; severe headaches; sudden numbness or weakness of the face, arm or leg; trouble walking; dizziness; loss of balance or coordination stomach pain sweating swelling of legs or ankles vomiting weight gain Side effects that usually do not require medical attention (report to your doctor or health care professional if they continue or are bothersome): back pain changes in taste decreased appetite dry  skin nausea tiredness This list may not describe all possible side effects. Call your doctor for medical advice about side effects. You may report side effects to FDA at 1-800-FDA-1088. Where should I keep my medication? This drug is given in a hospital or clinic and will not be stored at home. NOTE: This sheet is a summary. It may not cover all possible information. If you have questions about this medicine, talk to your doctor, pharmacist, or health care provider.  2022 Elsevier/Gold Standard (2019-01-22 10:50:46)  

## 2020-12-23 LAB — CA 125: Cancer Antigen (CA) 125: 57.4 U/mL — ABNORMAL HIGH (ref 0.0–38.1)

## 2021-01-05 ENCOUNTER — Encounter: Payer: Self-pay | Admitting: Oncology

## 2021-01-05 ENCOUNTER — Inpatient Hospital Stay: Payer: Medicare Other

## 2021-01-05 ENCOUNTER — Inpatient Hospital Stay (HOSPITAL_BASED_OUTPATIENT_CLINIC_OR_DEPARTMENT_OTHER): Payer: Medicare Other | Admitting: Oncology

## 2021-01-05 VITALS — BP 125/72 | HR 55 | Resp 16

## 2021-01-05 VITALS — BP 119/75 | HR 64 | Temp 97.1°F | Resp 16 | Wt 119.0 lb

## 2021-01-05 DIAGNOSIS — D701 Agranulocytosis secondary to cancer chemotherapy: Secondary | ICD-10-CM

## 2021-01-05 DIAGNOSIS — Z5111 Encounter for antineoplastic chemotherapy: Secondary | ICD-10-CM | POA: Diagnosis not present

## 2021-01-05 DIAGNOSIS — D6481 Anemia due to antineoplastic chemotherapy: Secondary | ICD-10-CM | POA: Diagnosis not present

## 2021-01-05 DIAGNOSIS — Z5112 Encounter for antineoplastic immunotherapy: Secondary | ICD-10-CM | POA: Diagnosis not present

## 2021-01-05 DIAGNOSIS — R911 Solitary pulmonary nodule: Secondary | ICD-10-CM

## 2021-01-05 DIAGNOSIS — T451X5A Adverse effect of antineoplastic and immunosuppressive drugs, initial encounter: Secondary | ICD-10-CM | POA: Diagnosis not present

## 2021-01-05 DIAGNOSIS — R21 Rash and other nonspecific skin eruption: Secondary | ICD-10-CM | POA: Diagnosis not present

## 2021-01-05 DIAGNOSIS — C569 Malignant neoplasm of unspecified ovary: Secondary | ICD-10-CM

## 2021-01-05 DIAGNOSIS — Z5189 Encounter for other specified aftercare: Secondary | ICD-10-CM | POA: Diagnosis not present

## 2021-01-05 LAB — CBC WITH DIFFERENTIAL/PLATELET
Abs Immature Granulocytes: 0.02 10*3/uL (ref 0.00–0.07)
Basophils Absolute: 0 10*3/uL (ref 0.0–0.1)
Basophils Relative: 1 %
Eosinophils Absolute: 0.1 10*3/uL (ref 0.0–0.5)
Eosinophils Relative: 2 %
HCT: 33.4 % — ABNORMAL LOW (ref 36.0–46.0)
Hemoglobin: 11.4 g/dL — ABNORMAL LOW (ref 12.0–15.0)
Immature Granulocytes: 1 %
Lymphocytes Relative: 23 %
Lymphs Abs: 1 10*3/uL (ref 0.7–4.0)
MCH: 36.3 pg — ABNORMAL HIGH (ref 26.0–34.0)
MCHC: 34.1 g/dL (ref 30.0–36.0)
MCV: 106.4 fL — ABNORMAL HIGH (ref 80.0–100.0)
Monocytes Absolute: 0.6 10*3/uL (ref 0.1–1.0)
Monocytes Relative: 14 %
Neutro Abs: 2.5 10*3/uL (ref 1.7–7.7)
Neutrophils Relative %: 59 %
Platelets: 172 10*3/uL (ref 150–400)
RBC: 3.14 MIL/uL — ABNORMAL LOW (ref 3.87–5.11)
RDW: 14.1 % (ref 11.5–15.5)
WBC: 4.2 10*3/uL (ref 4.0–10.5)
nRBC: 0 % (ref 0.0–0.2)

## 2021-01-05 LAB — COMPREHENSIVE METABOLIC PANEL
ALT: 10 U/L (ref 0–44)
AST: 20 U/L (ref 15–41)
Albumin: 4 g/dL (ref 3.5–5.0)
Alkaline Phosphatase: 61 U/L (ref 38–126)
Anion gap: 8 (ref 5–15)
BUN: 13 mg/dL (ref 8–23)
CO2: 23 mmol/L (ref 22–32)
Calcium: 9.7 mg/dL (ref 8.9–10.3)
Chloride: 99 mmol/L (ref 98–111)
Creatinine, Ser: 0.69 mg/dL (ref 0.44–1.00)
GFR, Estimated: >60 mL/min (ref >60)
Glucose, Bld: 95 mg/dL (ref 70–99)
Potassium: 4.1 mmol/L (ref 3.5–5.1)
Sodium: 130 mmol/L — ABNORMAL LOW (ref 135–145)
Total Bilirubin: 0.6 mg/dL (ref 0.3–1.2)
Total Protein: 7.2 g/dL (ref 6.5–8.1)

## 2021-01-05 LAB — PROTEIN, URINE, RANDOM: Total Protein, Urine: <6 mg/dL

## 2021-01-05 MED ORDER — PEGFILGRASTIM 6 MG/0.6ML ~~LOC~~ PSKT
6.0000 mg | PREFILLED_SYRINGE | Freq: Once | SUBCUTANEOUS | Status: AC
  Administered 2021-01-05: 6 mg via SUBCUTANEOUS

## 2021-01-05 MED ORDER — HEPARIN SOD (PORK) LOCK FLUSH 100 UNIT/ML IV SOLN
INTRAVENOUS | Status: AC
  Administered 2021-01-05: 500 [IU]
  Filled 2021-01-05: qty 5

## 2021-01-05 MED ORDER — SODIUM CHLORIDE 0.9 % IV SOLN
Freq: Once | INTRAVENOUS | Status: AC
Start: 2021-01-05 — End: 2021-01-05
  Filled 2021-01-05: qty 250

## 2021-01-05 MED ORDER — SODIUM CHLORIDE 0.9 % IV SOLN
10.0000 mg | Freq: Once | INTRAVENOUS | Status: AC
  Administered 2021-01-05: 10 mg via INTRAVENOUS
  Filled 2021-01-05: qty 10

## 2021-01-05 MED ORDER — SODIUM CHLORIDE 0.9 % IV SOLN
10.0000 mg/kg | Freq: Once | INTRAVENOUS | Status: AC
  Administered 2021-01-05: 500 mg via INTRAVENOUS
  Filled 2021-01-05: qty 16

## 2021-01-05 MED ORDER — DOXORUBICIN HCL LIPOSOMAL CHEMO INJECTION 2 MG/ML
44.0000 mg/m2 | Freq: Once | INTRAVENOUS | Status: AC
  Administered 2021-01-05: 70 mg via INTRAVENOUS
  Filled 2021-01-05: qty 25

## 2021-01-05 MED ORDER — DEXTROSE 5 % IV SOLN
Freq: Once | INTRAVENOUS | Status: AC
  Filled 2021-01-05: qty 250

## 2021-01-05 MED ORDER — HEPARIN SOD (PORK) LOCK FLUSH 100 UNIT/ML IV SOLN
500.0000 [IU] | Freq: Once | INTRAVENOUS | Status: AC | PRN
  Filled 2021-01-05: qty 5

## 2021-01-05 NOTE — Progress Notes (Signed)
Pt in for follow up, denies any difficulties or concerns today. 

## 2021-01-05 NOTE — Progress Notes (Signed)
[facility] Follow up visit  Patient Care Team: Patient, No Pcp Per (Inactive) as PCP - General (General Practice) Clent Jacks, [name], [name], [name], MD as Referring Physician (Obstetrics and Gynecology)  CHIEF COMPLAINTS/PURPOSE OF Visit Follow up for chemotherapy for ovarian cancer.  HISTORY OF PRESENTING ILLNESS: Katie Hill 70 y.o. female presents for follow up of management of stage IIIC ovarian cancer. She underwent  Neoadjuvant chemotherapy of carbo and taxol x 4  # Patient had debulking surgery on 03/14/2017. She had a laparoscopy with conversion to laparotomy, total hysterectomy, with bilateral salpingo oophorectomy, right aortic lymph node dissection, omentectomy. Pathology showed small foci of residual disease in ovary.   Genetic testing negative for 83 genes on [facility]'s Multi-Cancer panel (ALK, APC, ATM, AXIN2, BAP1, BARD1, BLM, BMPR1A, BRCA1, BRCA2, BRIP1, CASR, CDC73, CDH1, CDK4, CDKN1B, CDKN1C, CDKN2A, CEBPA, CHEK2, CTNNA1, DICER1, DIS3L2, EGFR, EPCAM, FH, FLCN, GATA2, GPC3, GREM1, HOXB13, HRAS, KIT, MAX, MEN1, MET, MITF, MLH1, MSH2, MSH3, MSH6, MUTYH, NBN, NF1, NF2, NTHL1, PALB2, PDGFRA, PHOX2B, PMS2, POLD1, POLE, POT1, PRKAR1A, PTCH1, PTEN, RAD50, RAD51C, RAD51D, RB1, RECQL4, RET, RUNX1, SDHA, SDHAF2, SDHB, SDHC, SDHD, SMAD4, SMARCA4, SMARCB1, SMARCE1, STK11, SUFU, TERC, TERT, TMEM127, TP53, TSC1, TSC2, VHL, WRN, WT1).   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  Myraid testing negative for somatic BRACA1/2, positive for HRD.   # HRD positive, was referred to [facility] for clinical trials of Olarparib maintenance. She opted out.  #  Treatment:  Stage IIIC Ovarian cancer:  #s/p Carboplatin and taxol x 4 neoadjuvant chemotherapy followed by debulking surgery  03/14/2017 # 03/28/2017 S/p Adjuvant carbo and taxol x3   Local recurrence, CA125 one 46.3 03/15/2018-05/17/2018 Carboplatin and Taxol x 4. CA125 decrease from 49.7 to 6 after 4 cycles of treatment.  Started on Olarparib 384m BID on 05/17/2018.  # was seen by Dr. BFransisca Connorsfor further evaluation due to rising Ca1 25 and MRI abdomenPelvis on 11/05/2018 showed Interval progression of, now measuring 2.7 x 2.3 cm and concerning for progression of metastatic disease.retrocaval lymph node in the abdomen Olaparib was held for short period of time. Her CA125 trended down again.  Dr. BFransisca Connorsrecommending resume olaparib and continue monitor. If she has more significant progression of disease she may be a candidate for clinical trial or will be treated with other chemotherapy drugs including Doxil, gemcitabine and Avastin.  #10/06/2020, CT chest abdomen pelvis with contrast showed new lymph nodes in the prevascular space of the upper anterior mediastinum most consistent with metastasis.  Single new right lower lobe pulmonary nodule is concerning for early pulmonary metastasis.  Interval enlargement of the left periaortic retroperitoneal lymph node.  Stable adjacent aorto caval adenopathy.  10/29/2020 MUGA LVEF normal 10/29/2020 Stopped Olarparib  11/03/2020 start Doxil + bevacizumab  INTERVAL HISTORY 70y.o. female with above oncology history reviewed by me today presents for evaluation for chemotherapy for treatment of ovarian cancer  Patient was accompanied by her husband. Patient reports doing well. No sob, leg swelling, nausea vomiting diarrhea.   . Review of Systems  Constitutional:  Negative for appetite change, chills, fatigue and fever.  HENT:   Negative for hearing loss and voice change.   Eyes:  Negative for eye problems.  Respiratory:  Negative for chest tightness and cough.   Cardiovascular:  Negative for chest pain.  Gastrointestinal:  Negative  for abdominal distention, abdominal pain and blood  in stool.  Endocrine: Negative for hot flashes.  Genitourinary:  Negative for difficulty urinating and frequency.   Musculoskeletal:  Negative for arthralgias.  Skin:  Negative for itching and rash.  Neurological:  Negative for extremity weakness.  Hematological:  Negative for adenopathy.  Psychiatric/Behavioral:  Negative for confusion.     [name] MEDICAL HISTORY: Past Medical History:  Diagnosis Date   Dysrhythmia    Genetic testing 03/28/2017   Multi-Cancer panel (83 genes) @ [facility] - No pathogenic mutations detected   High grade ovarian cancer ([place]) 11/20/2016   Pelvic mass in female     SURGICAL HISTORY: Past Surgical History:  Procedure Laterality Date   APPENDECTOMY     LAPAROSCOPY N/A 03/14/2017   Procedure: LAPAROSCOPY OPERATIVE;  Surgeon: Mellody Drown, MD;  Location: [facility];  Service: Gynecology;  Laterality: N/A;   LAPAROTOMY N/A 03/14/2017   Procedure: LAPAROTOMY;  Surgeon: Mellody Drown, MD;  Location: [facility] OR[facility];  Service: Gynecology;  Laterality: N/A;   LYMPH NODE DISSECTION N/A 03/14/2017   Procedure: LYMPH NODE DISSECTION;  Surgeon: Mellody Drown, MD;  Location: [facility] OR[facility];  Service: Gynecology;  Laterality: N/A;   OMENTECTOMY N/A 03/14/2017   Procedure: OMENTECTOMY;  Surgeon: Mellody Drown, MD;  Location: [facility] OR[facility];  Service: Gynecology;  Laterality: N/A;   PORTA CATH INSERTION N/A 11/27/2016   Procedure: Glori Luis Cath Insertion;  Surgeon: Algernon Huxley, MD;  Location: [facility];  Service: Cardiovascular;  Laterality: N/A;    SOCIAL HISTORY: Social History   Tobacco Use   Smoking status: Never   Smokeless tobacco: Never  Vaping Use   Vaping Use: Never used  Substance Use Topics   Alcohol use: Not Currently   Drug use: No     FAMILY HISTORY Family History  Problem Relation Age of Onset   Throat cancer Cousin    Throat cancer Cousin    Leukemia Cousin     ALLERGIES:  is allergic to omeprazole.  MEDICATIONS:  Current Outpatient  Medications  Medication Sig Dispense Refill   lidocaine-prilocaine (EMLA) cream Apply 1 application topically as needed. Apply small amount to port site at least 1 hour prior to it being accessed, cover with plastic wrap 30 g 1   ondansetron (ZOFRAN) 8 MG tablet Take 1 tablet (8 mg total) by mouth every 8 (eight) hours as needed for nausea or vomiting. 60 tablet 1   hydrocortisone cream 0.5 % Apply 1 application topically 3 (three) times daily. (Patient not taking: Reported on 01/05/2021) 30 g 0   No current facility-administered medications for this visit.   Facility-Administered Medications Ordered in Other Visits  Medication Dose Route Frequency Provider Last Rate Last Admin   DOXOrubicin HCL LIPOSOMAL (DOXIL) 70 mg in dextrose 5 % 250 mL chemo infusion  44 mg/m2 (Order-Specific) Intravenous Once Earlie Server, MD 285 mL/hr at 01/05/21 1105 70 mg at 01/05/21 1105   heparin lock flush 100 UNIT/ML injection            heparin lock flush 100 unit/mL  500 Units Intracatheter Once PRN Earlie Server, MD       pegfilgrastim (NEULASTA ONPRO KIT) injection 6 mg  6 mg Subcutaneous Once Earlie Server, MD        PHYSICAL EXAMINATION:  ECOG PERFORMANCE STATUS: 0 - Asymptomatic Vitals:   01/05/21 0910  BP: 119/75  Pulse: 64  Resp: 16  Temp: (!) 97.1 F (36.2 C)  SpO2: 100%    Filed Weights  01/05/21 0910  Weight: 119 lb (54 kg)     Physical Exam Constitutional:      General: She is not in acute distress.    Appearance: She is not diaphoretic.     Comments: Thin built, she walks independantly  HENT:     Head: Normocephalic and atraumatic.     Nose: Nose normal.     Mouth/Throat:     Pharynx: No oropharyngeal exudate.  Eyes:     General: No scleral icterus.    Pupils: Pupils are equal, round, and reactive to light.  Neck:     Vascular: No JVD.  Cardiovascular:     Rate and Rhythm: Normal rate and regular rhythm.     Heart sounds: No murmur heard. Pulmonary:     Effort: Pulmonary effort is  normal. No respiratory distress.     Breath sounds: Normal breath sounds. No rales.  Chest:     Chest wall: No tenderness.  Abdominal:     General: Bowel sounds are normal. There is no distension.     Palpations: Abdomen is soft.     Tenderness: There is no abdominal tenderness.  Musculoskeletal:        General: Normal range of motion.     Cervical back: Normal range of motion and neck supple.  Lymphadenopathy:     Cervical: No cervical adenopathy.  Skin:    General: Skin is warm and dry.     Findings: No erythema or rash.     Comments: Right anterior medi port +   Neurological:     Mental Status: She is alert and oriented to person, place, and time.     Cranial Nerves: No cranial nerve deficit.     Motor: No abnormal muscle tone.     Coordination: Coordination normal.  Psychiatric:        Mood and Affect: Affect normal.        Judgment: Judgment normal.       LABORATORY DATA: I have personally reviewed the data as listed: CBC    Component Value Date/Time   WBC 4.2 01/05/2021 0823   RBC 3.14 (L) 01/05/2021 0823   HGB 11.4 (L) 01/05/2021 0823   HCT 33.4 (L) 01/05/2021 0823   PLT 172 01/05/2021 0823   MCV 106.4 (H) 01/05/2021 0823   MCH 36.3 (H) 01/05/2021 0823   MCHC 34.1 01/05/2021 0823   RDW 14.1 01/05/2021 0823   LYMPHSABS 1.0 01/05/2021 0823   MONOABS 0.6 01/05/2021 0823   EOSABS 0.1 01/05/2021 0823   BASOSABS 0.0 01/05/2021 0823   CMP Latest Ref Rng & Units 01/05/2021 12/22/2020 12/08/2020  Glucose 70 - 99 mg/dL 95 96 112(H)  BUN 8 - 23 mg/dL '13 10 14  ' Creatinine 0.44 - 1.00 mg/dL 0.69 0.47 0.57  Sodium 135 - 145 mmol/L 130(L) 132(L) 132(L)  Potassium 3.5 - 5.1 mmol/L 4.1 4.0 4.0  Chloride 98 - 111 mmol/L 99 102 102  CO2 22 - 32 mmol/L '23 24 24  ' Calcium 8.9 - 10.3 mg/dL 9.7 9.4 9.7  Total Protein 6.5 - 8.1 g/dL 7.2 6.7 7.1  Total Bilirubin 0.3 - 1.2 mg/dL 0.6 0.3 0.6  Alkaline Phos 38 - 126 U/L 61 60 64  AST 15 - 41 U/L '20 15 17  ' ALT 0 - 44 U/L '10 8 9     ' Pathology 11/16/2016 Surgical Pathology  CASE: [id number]  PATIENT: Katie Hill  Surgical Pathology Report   SPECIMEN SUBMITTED:  A. Retroperitoneal adenopathy, left  DIAGNOSIS:  A. LYMPH NODE, LEFT RETROPERITONEAL; CT-GUIDED CORE BIOPSY:  - METASTATIC HIGH-GRADE SEROUS CARCINOMA.  Pathology 03/14/2017   DIAGNOSIS:  A. OMENTUM; OMENTECTOMY:  - NO TUMOR SEEN.  - ONE NEGATIVE LYMPH NODE (0/1).   B.  RIGHT FALLOPIAN TUBE AND OVARY; SALPINGO-OOPHORECTOMY:  - SMALL FOCI OF HIGH GRADE SEROUS CARCINOMA INVOLVING THE OVARY.  - MARKED THERAPY RELATED CHANGE.  - NO TUMOR SEEN IN THE FALLOPIAN TUBE.   C.  UTERUS, CERVIX, LEFT FALLOPIAN TUBE AND OVARY; HYSTERECTOMY AND LEFT  SALPINGO-OOPHORECTOMY:  - NABOTHIAN CYSTS.  - CYSTIC ATROPHY OF THE ENDOMETRIUM.  - SEROSAL ADHESIONS.  - UNREMARKABLE FALLOPIAN TUBE.  - OVARY SHOWING TREATMENT RELATED CHANGE.   D.  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    RADIOGRAPHIC STUDIES: I have personally reviewed the radiological images as listed and agreed with the findings in the report. NM Cardiac [facility] Rest  Result Date: 10/29/2020 CLINICAL DATA:  Ovarian cancer, pre cardiotoxic chemotherapy EXAM: NUCLEAR MEDICINE CARDIAC BLOOD POOL IMAGING (MUGA) TECHNIQUE: Cardiac multi-gated acquisition was performed at rest following intravenous injection of Tc-23mlabeled red blood cells. RADIOPHARMACEUTICALS:  20.5 mCi Tc-978mertechnetate in-vitro labeled red blood cells IV COMPARISON:  None FINDINGS: Calculated LEFT ventricular ejection fraction is 66%, normal. Study was obtained at a cardiac rate of 63 bpm. Patient was rhythmic during imaging. Cine analysis of the LEFT ventricle in 3 projections demonstrates normal LEFT ventricular wall motion. IMPRESSION: Normal LEFT ventricular ejection fraction of 66%. Normal LV wall motion. Electronically Signed   By: MaLavonia Dana.D.    On: 10/29/2020 16:27     ASSESSMENT/PLAN Cancer Staging Malignant neoplasm of ovary (HSurgical Arts CenterStaging form: Ovary, Fallopian Tube, and Primary Peritoneal Carcinoma, AJCC 8th Edition - Clinical stage from 11/22/2016: Stage IIIC (cT3c, cN1b, cM0) - Signed by YuEarlie ServerMD on 11/22/2016 Stage used in treatment planning: Yes National guidelines used in treatment planning: Yes Type of national guideline used in treatment planning: NCCN  1. High grade ovarian cancer ([facility]  2. Encounter for antineoplastic chemotherapy   3. Anemia associated with chemotherapy   4. Chemotherapy induced neutropenia (HCC)   5. Lung nodule   : # Ovarian Cancer, local recurrence.  Currently on olaparib 300 mg twice daily. She tolerates well.  CA 125 18.7-->61.2-->111-->90.5-->69--> 54.8--> 29.1-->47-->71.2-->67.7->73.2-->82.8-->70.4-->66.4--> 58.4--> 64.9--> 56.9--> 42.2-->47.7-->54.4-->68.2-> 97-->168-->70.3--> 57.4 Labs are reviewed and discussed with patient. Proceed with cycle 3 D1 Doxil/bevacizumab + onpro.  Check Echocardiogram.  CT chest abdomen pelvis in 3-4 weeks for assessment of treatment response.   #Chemotherapy-induced neutropenia, resolved. She gets GCSF support.  Recommend patient to take Claritin for 4 days.  Chronic anemia, hemoglobin stable at 11.4 Chemotherapy-induced.  Stable and continue to monitor.   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  Adequate B12 level   # Skin Rash, topical steroid cream. Stable symptoms.   Port-A-Cath in place  RTF 2 weeks for lab MD  bevacizumab.    ZhEarlie ServerMD, PhD Hematology Oncology [facility] AlDallas County Medical Centerager- [phone]/28/22

## 2021-01-05 NOTE — Patient Instructions (Signed)
[facility]  Discharge Instructions: Thank you for choosing [facility] to provide your oncology and hematology care.  If you have a lab appointment with the [facility], please go directly to the [facility] and check in at the registration area.  Wear comfortable clothing and clothing appropriate for easy access to any Portacath or PICC line.   We strive to give you quality time with your provider. You may need to reschedule your appointment if you arrive late (15 or more minutes).  Arriving late affects you and other patients whose appointments are after yours.  Also, if you miss three or more appointments without notifying the office, you may be dismissed from the clinic at the provider's discretion.      For prescription refill requests, have your pharmacy contact our office and allow 72 hours for refills to be completed.    Today you received the following chemotherapy and/or immunotherapy agents Zirabev & Doxil      To help prevent nausea and vomiting after your treatment, we encourage you to take your nausea medication as directed.  BELOW ARE SYMPTOMS THAT SHOULD BE REPORTED IMMEDIATELY: *FEVER GREATER THAN 100.4 F (38 C) OR HIGHER *CHILLS OR SWEATING *NAUSEA AND VOMITING THAT IS NOT CONTROLLED WITH YOUR NAUSEA MEDICATION *UNUSUAL SHORTNESS OF BREATH *UNUSUAL BRUISING OR BLEEDING *URINARY PROBLEMS (pain or burning when urinating, or frequent urination) *BOWEL PROBLEMS (unusual diarrhea, constipation, pain near the anus) TENDERNESS IN MOUTH AND THROAT WITH OR WITHOUT PRESENCE OF ULCERS (sore throat, sores in mouth, or a toothache) UNUSUAL RASH, SWELLING OR PAIN  UNUSUAL VAGINAL DISCHARGE OR ITCHING   Items with * indicate a potential emergency and should be followed up as soon as possible or go to the Emergency Department if any problems should occur.  Please show the CHEMOTHERAPY ALERT CARD or IMMUNOTHERAPY ALERT CARD at  check-in to the Emergency Department and triage nurse.  Should you have questions after your visit or need to cancel or reschedule your appointment, please contact [facility]  [phone] and follow the prompts.  Office hours are 8:00 a.m. to 4:30 p.m. Monday - Friday. Please note that voicemails left after 4:00 p.m. may not be returned until the following business day.  We are closed weekends and major holidays. You have access to a nurse at all times for urgent questions. Please call the main number to the clinic [phone] and follow the prompts.  For any non-urgent questions, you may also contact your provider using MyChart. We now offer e-Visits for anyone 14 and older to request care online for non-urgent symptoms. For details visit [email or web address].   Also download the MyChart app! Go to the app store, search "MyChart", open the app, select [facility], and log in with your MyChart username and password.  Due to Covid, a mask is required upon entering the hospital/clinic. If you do not have a mask, one will be given to you upon arrival. For doctor visits, patients may have 1 support person aged 67 or older with them. For treatment visits, patients cannot have anyone with them due to current Covid guidelines and our immunocompromised population.

## 2021-01-14 ENCOUNTER — Other Ambulatory Visit: Payer: Self-pay

## 2021-01-14 ENCOUNTER — Ambulatory Visit
Admission: RE | Admit: 2021-01-14 | Discharge: 2021-01-14 | Disposition: A | Discharge status: Home / Self Care | Payer: Medicare Other | Source: Ambulatory Visit | Attending: Oncology | Admitting: Oncology

## 2021-01-14 DIAGNOSIS — Z0189 Encounter for other specified special examinations: Secondary | ICD-10-CM | POA: Diagnosis not present

## 2021-01-14 DIAGNOSIS — I499 Cardiac arrhythmia, unspecified: Secondary | ICD-10-CM | POA: Insufficient documentation

## 2021-01-14 DIAGNOSIS — C569 Malignant neoplasm of unspecified ovary: Secondary | ICD-10-CM | POA: Diagnosis not present

## 2021-01-14 DIAGNOSIS — Z5111 Encounter for antineoplastic chemotherapy: Secondary | ICD-10-CM

## 2021-01-14 DIAGNOSIS — Z01818 Encounter for other preprocedural examination: Secondary | ICD-10-CM | POA: Insufficient documentation

## 2021-01-14 LAB — ECHOCARDIOGRAM COMPLETE
AR max vel: 2.35 cm2
AV Area VTI: 2.47 cm2
AV Area mean vel: 2.14 cm2
AV Mean grad: 3 mmHg
AV Peak grad: 4.2 mmHg
Ao pk vel: 1.03 m/s
Area-P 1/2: 4.01 cm2
S' Lateral: 2.83 cm

## 2021-01-14 NOTE — Progress Notes (Signed)
*  PRELIMINARY RESULTS* Echocardiogram 2D Echocardiogram has been performed.  Sherrie Sport 01/14/2021, 10:57 AM

## 2021-01-19 ENCOUNTER — Inpatient Hospital Stay (HOSPITAL_BASED_OUTPATIENT_CLINIC_OR_DEPARTMENT_OTHER): Payer: Medicare Other | Admitting: Oncology

## 2021-01-19 ENCOUNTER — Inpatient Hospital Stay: Payer: Medicare Other | Attending: Oncology

## 2021-01-19 ENCOUNTER — Other Ambulatory Visit: Payer: Self-pay

## 2021-01-19 ENCOUNTER — Encounter: Payer: Self-pay | Admitting: Oncology

## 2021-01-19 ENCOUNTER — Inpatient Hospital Stay: Payer: Medicare Other

## 2021-01-19 VITALS — BP 119/74 | HR 70 | Temp 96.9°F | Resp 16 | Wt 120.4 lb

## 2021-01-19 DIAGNOSIS — R911 Solitary pulmonary nodule: Secondary | ICD-10-CM | POA: Diagnosis not present

## 2021-01-19 DIAGNOSIS — Z5112 Encounter for antineoplastic immunotherapy: Secondary | ICD-10-CM | POA: Insufficient documentation

## 2021-01-19 DIAGNOSIS — R21 Rash and other nonspecific skin eruption: Secondary | ICD-10-CM | POA: Insufficient documentation

## 2021-01-19 DIAGNOSIS — Z5111 Encounter for antineoplastic chemotherapy: Secondary | ICD-10-CM | POA: Diagnosis not present

## 2021-01-19 DIAGNOSIS — D701 Agranulocytosis secondary to cancer chemotherapy: Secondary | ICD-10-CM | POA: Diagnosis not present

## 2021-01-19 DIAGNOSIS — D6481 Anemia due to antineoplastic chemotherapy: Secondary | ICD-10-CM | POA: Insufficient documentation

## 2021-01-19 DIAGNOSIS — D696 Thrombocytopenia, unspecified: Secondary | ICD-10-CM | POA: Diagnosis not present

## 2021-01-19 DIAGNOSIS — T451X5A Adverse effect of antineoplastic and immunosuppressive drugs, initial encounter: Secondary | ICD-10-CM | POA: Diagnosis not present

## 2021-01-19 DIAGNOSIS — C569 Malignant neoplasm of unspecified ovary: Secondary | ICD-10-CM

## 2021-01-19 DIAGNOSIS — D7589 Other specified diseases of blood and blood-forming organs: Secondary | ICD-10-CM | POA: Insufficient documentation

## 2021-01-19 LAB — CBC WITH DIFFERENTIAL/PLATELET
Abs Immature Granulocytes: 0.14 10*3/uL — ABNORMAL HIGH (ref 0.00–0.07)
Basophils Absolute: 0.1 10*3/uL (ref 0.0–0.1)
Basophils Relative: 2 %
Eosinophils Absolute: 0.1 10*3/uL (ref 0.0–0.5)
Eosinophils Relative: 3 %
HCT: 29.7 % — ABNORMAL LOW (ref 36.0–46.0)
Hemoglobin: 10.1 g/dL — ABNORMAL LOW (ref 12.0–15.0)
Immature Granulocytes: 5 %
Lymphocytes Relative: 20 %
Lymphs Abs: 0.6 10*3/uL — ABNORMAL LOW (ref 0.7–4.0)
MCH: 35.8 pg — ABNORMAL HIGH (ref 26.0–34.0)
MCHC: 34 g/dL (ref 30.0–36.0)
MCV: 105.3 fL — ABNORMAL HIGH (ref 80.0–100.0)
Monocytes Absolute: 0.2 10*3/uL (ref 0.1–1.0)
Monocytes Relative: 6 %
Neutro Abs: 2 10*3/uL (ref 1.7–7.7)
Neutrophils Relative %: 64 %
Platelets: 126 10*3/uL — ABNORMAL LOW (ref 150–400)
RBC: 2.82 MIL/uL — ABNORMAL LOW (ref 3.87–5.11)
RDW: 14.2 % (ref 11.5–15.5)
WBC: 3.1 10*3/uL — ABNORMAL LOW (ref 4.0–10.5)
nRBC: 0 % (ref 0.0–0.2)

## 2021-01-19 LAB — COMPREHENSIVE METABOLIC PANEL
ALT: 9 U/L (ref 0–44)
AST: 16 U/L (ref 15–41)
Albumin: 3.8 g/dL (ref 3.5–5.0)
Alkaline Phosphatase: 67 U/L (ref 38–126)
Anion gap: 5 (ref 5–15)
BUN: 12 mg/dL (ref 8–23)
CO2: 24 mmol/L (ref 22–32)
Calcium: 9.6 mg/dL (ref 8.9–10.3)
Chloride: 101 mmol/L (ref 98–111)
Creatinine, Ser: 0.52 mg/dL (ref 0.44–1.00)
GFR, Estimated: >60 mL/min (ref >60)
Glucose, Bld: 95 mg/dL (ref 70–99)
Potassium: 4 mmol/L (ref 3.5–5.1)
Sodium: 130 mmol/L — ABNORMAL LOW (ref 135–145)
Total Bilirubin: 0.3 mg/dL (ref 0.3–1.2)
Total Protein: 6.8 g/dL (ref 6.5–8.1)

## 2021-01-19 LAB — PROTEIN, URINE, RANDOM: Total Protein, Urine: <6 mg/dL

## 2021-01-19 MED ORDER — HEPARIN SOD (PORK) LOCK FLUSH 100 UNIT/ML IV SOLN
INTRAVENOUS | Status: AC
  Filled 2021-01-19: qty 5

## 2021-01-19 MED ORDER — SODIUM CHLORIDE 0.9 % IV SOLN
Freq: Once | INTRAVENOUS | Status: AC
  Filled 2021-01-19: qty 250

## 2021-01-19 MED ORDER — SODIUM CHLORIDE 0.9 % IV SOLN
10.0000 mg/kg | Freq: Once | INTRAVENOUS | Status: AC
  Administered 2021-01-19: 500 mg via INTRAVENOUS
  Filled 2021-01-19: qty 16

## 2021-01-19 MED ORDER — SODIUM CHLORIDE 0.9% FLUSH
10.0000 mL | Freq: Once | INTRAVENOUS | Status: AC
  Administered 2021-01-19: 10 mL via INTRAVENOUS
  Filled 2021-01-19: qty 10

## 2021-01-19 NOTE — Progress Notes (Signed)
[facility] Follow up visit  Patient Care Team: Patient, No Pcp Per (Inactive) as PCP - General (General Practice) Clent Jacks, [name], [name], [name], MD as Referring Physician (Obstetrics and Gynecology)  CHIEF COMPLAINTS/PURPOSE OF Visit Follow up for chemotherapy for ovarian cancer.  HISTORY OF PRESENTING ILLNESS: Katie Hill 70 y.o. female presents for follow up of management of stage IIIC ovarian cancer. She underwent  Neoadjuvant chemotherapy of carbo and taxol x 4  # Patient had debulking surgery on 03/14/2017. She had a laparoscopy with conversion to laparotomy, total hysterectomy, with bilateral salpingo oophorectomy, right aortic lymph node dissection, omentectomy. Pathology showed small foci of residual disease in ovary.   Genetic testing negative for 83 genes on [facility]'s Multi-Cancer panel (ALK, APC, ATM, AXIN2, BAP1, BARD1, BLM, BMPR1A, BRCA1, BRCA2, BRIP1, CASR, CDC73, CDH1, CDK4, CDKN1B, CDKN1C, CDKN2A, CEBPA, CHEK2, CTNNA1, DICER1, DIS3L2, EGFR, EPCAM, FH, FLCN, GATA2, GPC3, GREM1, HOXB13, HRAS, KIT, MAX, MEN1, MET, MITF, MLH1, MSH2, MSH3, MSH6, MUTYH, NBN, NF1, NF2, NTHL1, PALB2, PDGFRA, PHOX2B, PMS2, POLD1, POLE, POT1, PRKAR1A, PTCH1, PTEN, RAD50, RAD51C, RAD51D, RB1, RECQL4, RET, RUNX1, SDHA, SDHAF2, SDHB, SDHC, SDHD, SMAD4, SMARCA4, SMARCB1, SMARCE1, STK11, SUFU, TERC, TERT, TMEM127, TP53, TSC1, TSC2, VHL, WRN, WT1).   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  Myraid testing negative for somatic BRACA1/2, positive for HRD.   # HRD positive, was referred to [facility] for clinical trials of Olarparib maintenance. She opted out.  #  Treatment:  Stage IIIC Ovarian cancer:  #s/p Carboplatin and taxol x 4 neoadjuvant chemotherapy followed by debulking surgery  03/14/2017 # 03/28/2017 S/p Adjuvant carbo and taxol x3   Local recurrence, CA125 one 46.3 03/15/2018-05/17/2018 Carboplatin and Taxol x 4. CA125 decrease from 49.7 to 6 after 4 cycles of treatment.  Started on Olarparib 393m BID on 05/17/2018.  # was seen by Dr. BFransisca Connorsfor further evaluation due to rising Ca1 25 and MRI abdomenPelvis on 11/05/2018 showed Interval progression of, now measuring 2.7 x 2.3 cm and concerning for progression of metastatic disease.retrocaval lymph node in the abdomen Olaparib was held for short period of time. Her CA125 trended down again.  Dr. BFransisca Connorsrecommending resume olaparib and continue monitor. If she has more significant progression of disease she may be a candidate for clinical trial or will be treated with other chemotherapy drugs including Doxil, gemcitabine and Avastin.  #10/06/2020, CT chest abdomen pelvis with contrast showed new lymph nodes in the prevascular space of the upper anterior mediastinum most consistent with metastasis.  Single new right lower lobe pulmonary nodule is concerning for early pulmonary metastasis.  Interval enlargement of the left periaortic retroperitoneal lymph node.  Stable adjacent aorto caval adenopathy.  10/29/2020 MUGA LVEF normal 10/29/2020 Stopped Olarparib  11/03/2020 start Doxil + bevacizumab  INTERVAL HISTORY 70y.o. female with above oncology history reviewed by me today presents for evaluation for chemotherapy for treatment of ovarian cancer  Patient was accompanied by her husband. Patient tolerates treatment well.  No shortness of breath, leg swelling, nausea vomiting diarrhea. 01/14/21 Echocardiogram -LVEF 55-60%   . Review of Systems  Constitutional:  Negative for appetite change, chills, fatigue and fever.  HENT:   Negative for hearing loss and voice change.   Eyes:  Negative for eye problems.  Respiratory:  Negative for chest tightness and cough.   Cardiovascular:  Negative  for chest pain.  Gastrointestinal:   Negative for abdominal distention, abdominal pain and blood in stool.  Endocrine: Negative for hot flashes.  Genitourinary:  Negative for difficulty urinating and frequency.   Musculoskeletal:  Negative for arthralgias.  Skin:  Negative for itching and rash.  Neurological:  Negative for extremity weakness.  Hematological:  Negative for adenopathy.  Psychiatric/Behavioral:  Negative for confusion.     [name] MEDICAL HISTORY: Past Medical History:  Diagnosis Date   Dysrhythmia    Genetic testing 03/28/2017   Multi-Cancer panel (83 genes) @ [facility] - No pathogenic mutations detected   High grade ovarian cancer ([place]) 11/20/2016   Pelvic mass in female     SURGICAL HISTORY: Past Surgical History:  Procedure Laterality Date   APPENDECTOMY     LAPAROSCOPY N/A 03/14/2017   Procedure: LAPAROSCOPY OPERATIVE;  Surgeon: Mellody Drown, MD;  Location: [facility];  Service: Gynecology;  Laterality: N/A;   LAPAROTOMY N/A 03/14/2017   Procedure: LAPAROTOMY;  Surgeon: Mellody Drown, MD;  Location: [facility];  Service: Gynecology;  Laterality: N/A;   LYMPH NODE DISSECTION N/A 03/14/2017   Procedure: LYMPH NODE DISSECTION;  Surgeon: Mellody Drown, MD;  Location: [facility];  Service: Gynecology;  Laterality: N/A;   OMENTECTOMY N/A 03/14/2017   Procedure: OMENTECTOMY;  Surgeon: Mellody Drown, MD;  Location: [facility];  Service: Gynecology;  Laterality: N/A;   PORTA CATH INSERTION N/A 11/27/2016   Procedure: Glori Luis Cath Insertion;  Surgeon: Algernon Huxley, MD;  Location: [facility];  Service: Cardiovascular;  Laterality: N/A;    SOCIAL HISTORY: Social History   Tobacco Use   Smoking status: Never   Smokeless tobacco: Never  Vaping Use   Vaping Use: Never used  Substance Use Topics   Alcohol use: Not Currently   Drug use: No     FAMILY HISTORY Family History  Problem Relation Age of Onset   Throat cancer Cousin    Throat cancer Cousin    Leukemia Cousin     ALLERGIES:  is  allergic to omeprazole.  MEDICATIONS:  Current Outpatient Medications  Medication Sig Dispense Refill   lidocaine-prilocaine (EMLA) cream Apply 1 application topically as needed. Apply small amount to port site at least 1 hour prior to it being accessed, cover with plastic wrap 30 g 1   ondansetron (ZOFRAN) 8 MG tablet Take 1 tablet (8 mg total) by mouth every 8 (eight) hours as needed for nausea or vomiting. 60 tablet 1   hydrocortisone cream 0.5 % Apply 1 application topically 3 (three) times daily. (Patient not taking: No sig reported) 30 g 0   No current facility-administered medications for this visit.    PHYSICAL EXAMINATION:  ECOG PERFORMANCE STATUS: 0 - Asymptomatic Vitals:   01/19/21 0903  BP: 119/74  Pulse: 70  Resp: 16  Temp: (!) 96.9 F (36.1 C)    Filed Weights   01/19/21 0903  Weight: 120 lb 6.4 oz (54.6 kg)     Physical Exam Constitutional:      General: She is not in acute distress.    Appearance: She is not diaphoretic.     Comments: Thin built, she walks independantly  HENT:     Head: Normocephalic and atraumatic.     Nose: Nose normal.     Mouth/Throat:     Pharynx: No oropharyngeal exudate.  Eyes:     General: No scleral icterus.    Pupils: Pupils are equal, round, and reactive to light.  Neck:  Vascular: No JVD.  Cardiovascular:     Rate and Rhythm: Normal rate and regular rhythm.     Heart sounds: No murmur heard. Pulmonary:     Effort: Pulmonary effort is normal. No respiratory distress.     Breath sounds: Normal breath sounds. No rales.  Chest:     Chest wall: No tenderness.  Abdominal:     General: Bowel sounds are normal. There is no distension.     Palpations: Abdomen is soft.     Tenderness: There is no abdominal tenderness.  Musculoskeletal:        General: Normal range of motion.     Cervical back: Normal range of motion and neck supple.  Lymphadenopathy:     Cervical: No cervical adenopathy.  Skin:    General: Skin is  warm and dry.     Findings: No erythema or rash.     Comments: Right anterior medi port +   Neurological:     Mental Status: She is alert and oriented to person, place, and time.     Cranial Nerves: No cranial nerve deficit.     Motor: No abnormal muscle tone.     Coordination: Coordination normal.  Psychiatric:        Mood and Affect: Affect normal.        Judgment: Judgment normal.       LABORATORY DATA: I have personally reviewed the data as listed: CBC    Component Value Date/Time   WBC 3.1 (L) 01/19/2021 0838   RBC 2.82 (L) 01/19/2021 0838   HGB 10.1 (L) 01/19/2021 0838   HCT 29.7 (L) 01/19/2021 0838   PLT 126 (L) 01/19/2021 0838   MCV 105.3 (H) 01/19/2021 0838   MCH 35.8 (H) 01/19/2021 0838   MCHC 34.0 01/19/2021 0838   RDW 14.2 01/19/2021 0838   LYMPHSABS 0.6 (L) 01/19/2021 0838   MONOABS 0.2 01/19/2021 0838   EOSABS 0.1 01/19/2021 0838   BASOSABS 0.1 01/19/2021 0838   CMP Latest Ref Rng & Units 01/19/2021 01/05/2021 12/22/2020  Glucose 70 - 99 mg/dL 95 95 96  BUN 8 - 23 mg/dL _0 Creatinine 0.44 - 1.00 mg/dL 0.52 0.69 0.47  Sodium 135 - 145 mmol/L 130(L) 130(L) 132(L)  Potassium 3.5 - 5.1 mmol/L 4.0 4.1 4.0  Chloride 98 - 111 mmol/L 101 99 102  CO2 22 - 32 mmol/L _1 Calcium 8.9 - 10.3 mg/dL 9.6 9.7 9.4  Total Protein 6.5 - 8.1 g/dL 6.8 7.2 6.7  Total Bilirubin 0.3 - 1.2 mg/dL 0.3 0.6 0.3  Alkaline Phos 38 - 126 U/L 67 61 60  AST 15 - 41 U/L _2 ALT 0 - 44 U/L _3 Pathology 11/16/2016 Surgical Pathology  CASE: [id number]  PATIENT: Katie Hill  Surgical Pathology Report   SPECIMEN SUBMITTED:  A. Retroperitoneal adenopathy, left  DIAGNOSIS:  A. LYMPH NODE, LEFT RETROPERITONEAL; CT-GUIDED CORE BIOPSY:  - METASTATIC HIGH-GRADE SEROUS CARCINOMA.  Pathology 03/14/2017   DIAGNOSIS:  A. OMENTUM; OMENTECTOMY:  - NO TUMOR SEEN.  - ONE NEGATIVE LYMPH NODE (0/1).   B.  RIGHT FALLOPIAN TUBE AND OVARY; SALPINGO-OOPHORECTOMY:  -  SMALL FOCI OF HIGH GRADE SEROUS CARCINOMA INVOLVING THE OVARY.  - MARKED THERAPY RELATED CHANGE.  - NO TUMOR SEEN IN THE FALLOPIAN TUBE.   C.  UTERUS, CERVIX, LEFT FALLOPIAN TUBE AND OVARY; HYSTERECTOMY AND LEFT  SALPINGO-OOPHORECTOMY:  - NABOTHIAN CYSTS.  - CYSTIC ATROPHY OF THE  ENDOMETRIUM.  - SEROSAL ADHESIONS.  - UNREMARKABLE FALLOPIAN TUBE.  - OVARY SHOWING TREATMENT RELATED CHANGE.   D.  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    RADIOGRAPHIC STUDIES: I have personally reviewed the radiological images as listed and agreed with the findings in the report. NM Cardiac Muga Rest  Result Date: 10/29/2020 CLINICAL DATA:  Ovarian cancer, pre cardiotoxic chemotherapy EXAM: NUCLEAR MEDICINE CARDIAC BLOOD POOL IMAGING (MUGA) TECHNIQUE: Cardiac multi-gated acquisition was performed at rest following intravenous injection of Tc-48mlabeled red blood cells. RADIOPHARMACEUTICALS:  20.5 mCi Tc-968mertechnetate in-vitro labeled red blood cells IV COMPARISON:  None FINDINGS: Calculated LEFT ventricular ejection fraction is 66%, normal. Study was obtained at a cardiac rate of 63 bpm. Patient was rhythmic during imaging. Cine analysis of the LEFT ventricle in 3 projections demonstrates normal LEFT ventricular wall motion. IMPRESSION: Normal LEFT ventricular ejection fraction of 66%. Normal LV wall motion. Electronically Signed   By: MaLavonia Dana.D.   On: 10/29/2020 16:27   ECHOCARDIOGRAM COMPLETE  Result Date: 01/14/2021    ECHOCARDIOGRAM REPORT   Patient Name:   Katie CLENDENINate of Exam: 01/14/2021 Medical Rec #:  [id number]      Height:       68.5 in Accession #:    [id number]     Weight:       119.0 lb Date of Birth:  [birth year]      BSA:          1.648 m Patient [age]:    [age]         BP:           125/72 mmHg Patient Gender: F                HR:           55 bpm. Exam Location:  [facility] Procedure: 2D Echo, Cardiac  Doppler, Color Doppler and Strain Analysis Indications:     Chemo Z09  History:         Patient has no prior history of Echocardiogram examinations.                  Dysrhythmia, High grade ovarian cancer.  Sonographer:     JeSherrie Sporteferring Phys:  [id number] Phys: MuKathlyn SacramentoD  Sonographer Comments: Suboptimal apical window. Global longitudinal strain was attempted. IMPRESSIONS  1. Left ventricular ejection fraction, by estimation, is 55 to 60%. The left ventricle has normal function. The left ventricle has no regional wall motion abnormalities. Left ventricular diastolic parameters were normal.  2. Right ventricular systolic function is normal. The right ventricular size is normal. There is normal pulmonary artery systolic pressure.  3. The mitral valve is normal in structure. No evidence of mitral valve regurgitation. No evidence of mitral stenosis.  4. The aortic valve is normal in structure. Aortic valve regurgitation is not visualized. No aortic stenosis is present.  5. The inferior vena cava is normal in size with greater than 50% respiratory variability, suggesting right atrial pressure of 3 mmHg. FINDINGS  Left Ventricle: Left ventricular ejection fraction, by estimation, is 55 to 60%. The left ventricle has normal function. The left ventricle has no regional wall motion abnormalities. Global longitudinal strain performed but not reported based on interpreter judgement due to suboptimal tracking. The left ventricular internal cavity size was normal in size. There is no left ventricular  hypertrophy. Left ventricular diastolic parameters were normal. Right Ventricle: The right ventricular size is normal. No increase in right ventricular wall thickness. Right ventricular systolic function is normal. There is normal pulmonary artery systolic pressure. The tricuspid regurgitant velocity is 2.52 m/s, and  with an assumed right atrial pressure of 3 mmHg, the estimated right ventricular  systolic pressure is 97.3 mmHg. Left Atrium: Left atrial size was normal in size. Right Atrium: Right atrial size was normal in size. Pericardium: There is no evidence of pericardial effusion. Mitral Valve: The mitral valve is normal in structure. No evidence of mitral valve regurgitation. No evidence of mitral valve stenosis. Tricuspid Valve: The tricuspid valve is normal in structure. Tricuspid valve regurgitation is trivial. No evidence of tricuspid stenosis. Aortic Valve: The aortic valve is normal in structure. Aortic valve regurgitation is not visualized. No aortic stenosis is present. Aortic valve mean gradient measures 3.0 mmHg. Aortic valve peak gradient measures 4.2 mmHg. Aortic valve area, by VTI measures 2.47 cm. Pulmonic Valve: The pulmonic valve was normal in structure. Pulmonic valve regurgitation is not visualized. No evidence of pulmonic stenosis. Aorta: The aortic root is normal in size and structure. Venous: The inferior vena cava is normal in size with greater than 50% respiratory variability, suggesting right atrial pressure of 3 mmHg. IAS/Shunts: No atrial level shunt detected by color Hill Doppler.  LEFT VENTRICLE PLAX 2D LVIDd:         4.24 cm   Diastology LVIDs:         2.83 cm   LV e' medial:    6.96 cm/s LV PW:         0.92 cm   LV E/e' medial:  9.9 LV IVS:        0.88 cm   LV e' lateral:   7.18 cm/s LVOT diam:     2.00 cm   LV E/e' lateral: 9.6 LV SV:         48 LV SV Index:   29 LVOT Area:     3.14 cm                           3D Volume EF:                          3D EF:        54 %                          LV EDV:       493 ml                          LV ESV:       227 ml                          LV SV:        266 ml LEFT ATRIUM           Index        RIGHT ATRIUM           Index LA diam:      2.90 cm 1.76 cm/m   RA Area:     11.40 cm LA Vol (A2C): 42.2 ml 25.61 ml/m  RA Volume:   27.40 ml  16.63 ml/m LA Vol (A4C): 18.0 ml 10.92 ml/m  AORTIC VALVE                    PULMONIC  VALVE AV Area (Vmax):    2.35 cm     PV Vmax:        0.70 m/s AV Area (Vmean):   2.14 cm     PV Peak grad:   1.9 mmHg AV Area (VTI):     2.47 cm     RVOT Peak grad: 2 mmHg AV Vmax:           103.00 cm/s AV Vmean:          74.400 cm/s AV VTI:            0.196 m AV Peak Grad:      4.2 mmHg AV Mean Grad:      3.0 mmHg LVOT Vmax:         77.00 cm/s LVOT Vmean:        50.700 cm/s LVOT VTI:          0.154 m LVOT/AV VTI ratio: 0.79  AORTA Ao Root diam: 3.10 cm MITRAL VALVE               TRICUSPID VALVE MV Area (PHT): 4.01 cm    TR Peak grad:   25.4 mmHg MV Decel Time: 189 msec    TR Vmax:        252.00 cm/s MV E velocity: 69.00 cm/s MV A velocity: 62.60 cm/s  SHUNTS MV E/A ratio:  1.10        Systemic VTI:  0.15 m                            Systemic Diam: 2.00 cm Kathlyn Sacramento MD Electronically signed by Kathlyn Sacramento MD Signature Date/Time: 01/14/2021/1:08:59 PM    Final      ASSESSMENT/PLAN Cancer Staging Malignant neoplasm of ovary [facility]) Staging form: Ovary, Fallopian Tube, and Primary Peritoneal Carcinoma, AJCC 8th Edition - Clinical stage from 11/22/2016: Stage IIIC (cT3c, cN1b, cM0) - Signed by Earlie Server, MD on 11/22/2016 Stage used in treatment planning: Yes National guidelines used in treatment planning: Yes Type of national guideline used in treatment planning: NCCN  1. High grade ovarian cancer ([place])   2. Encounter for antineoplastic chemotherapy   3. Anemia associated with chemotherapy   4. Chemotherapy induced neutropenia ([facility])   5. Thrombocytopenia ([place])   : # Ovarian Cancer, local recurrence.  Currently on olaparib 300 mg twice daily. She tolerates well.  CA 125 18.7-->61.2-->111-->90.5-->69--> 54.8--> 29.1-->47-->71.2-->67.7->73.2-->82.8-->70.4-->66.4--> 58.4--> 64.9--> 56.9--> 42.2-->47.7-->54.4-->68.2-> 97-->168-->70.3--> 57.4 Labs reviewed and discussed with patient Proceed with cycle 3-day 15 bevacizumab. Echocardiogram results were reviewed and discussed with patient.  CT scan  was scheduled on 02/01/2021  #Chemotherapy-induced neutropenia, resolved. She gets GCSF support.  Recommend patient to take Claritin for 4 days.  Chronic anemia, chemotherapy-induced.  Hemoglobin 10.1. Chronic macrocytosis with adequate B12 and folate  # Skin Rash, topical steroid cream as needed.. Stable symptoms.   Port-A-Cath in place Recommendation of influenza vaccination was discussed with patient.  She declined.  RTF 2 weeks for lab MD  Doxil andbevacizumab.    Earlie Server, MD, PhD Hematology Oncology [facility] at [facility] Pager- [phone] 01/19/21

## 2021-01-19 NOTE — Patient Instructions (Addendum)
[facility]  Discharge Instructions: Thank you for choosing [facility] to provide your oncology and hematology care.  If you have a lab appointment with the [facility], please go directly to the [facility] and check in at the registration area.  Wear comfortable clothing and clothing appropriate for easy access to any Portacath or PICC line.   We strive to give you quality time with your provider. You may need to reschedule your appointment if you arrive late (15 or more minutes).  Arriving late affects you and other patients whose appointments are after yours.  Also, if you miss three or more appointments without notifying the office, you may be dismissed from the clinic at the provider's discretion.      For prescription refill requests, have your pharmacy contact our office and allow 72 hours for refills to be completed.    Today you received the following chemotherapy and/or immunotherapy agents ZIRABEV      To help prevent nausea and vomiting after your treatment, we encourage you to take your nausea medication as directed.  BELOW ARE SYMPTOMS THAT SHOULD BE REPORTED IMMEDIATELY: *FEVER GREATER THAN 100.4 F (38 C) OR HIGHER *CHILLS OR SWEATING *NAUSEA AND VOMITING THAT IS NOT CONTROLLED WITH YOUR NAUSEA MEDICATION *UNUSUAL SHORTNESS OF BREATH *UNUSUAL BRUISING OR BLEEDING *URINARY PROBLEMS (pain or burning when urinating, or frequent urination) *BOWEL PROBLEMS (unusual diarrhea, constipation, pain near the anus) TENDERNESS IN MOUTH AND THROAT WITH OR WITHOUT PRESENCE OF ULCERS (sore throat, sores in mouth, or a toothache) UNUSUAL RASH, SWELLING OR PAIN  UNUSUAL VAGINAL DISCHARGE OR ITCHING   Items with * indicate a potential emergency and should be followed up as soon as possible or go to the Emergency Department if any problems should occur.  Please show the CHEMOTHERAPY ALERT CARD or IMMUNOTHERAPY ALERT CARD at check-in to  the Emergency Department and triage nurse.  Should you have questions after your visit or need to cancel or reschedule your appointment, please contact [facility]  [phone] and follow the prompts.  Office hours are 8:00 a.m. to 4:30 p.m. Monday - Friday. Please note that voicemails left after 4:00 p.m. may not be returned until the following business day.  We are closed weekends and major holidays. You have access to a nurse at all times for urgent questions. Please call the main number to the clinic [phone] and follow the prompts.  For any non-urgent questions, you may also contact your provider using MyChart. We now offer e-Visits for anyone 69 and older to request care online for non-urgent symptoms. For details visit [email or web address].   Also download the MyChart app! Go to the app store, search "MyChart", open the app, select [facility], and log in with your MyChart username and password.  Due to Covid, a mask is required upon entering the hospital/clinic. If you do not have a mask, one will be given to you upon arrival. For doctor visits, patients may have 1 support person aged 100 or older with them. For treatment visits, patients cannot have anyone with them due to current Covid guidelines and our immunocompromised population.   Bevacizumab injection What is this medication? BEVACIZUMAB (be va SIZ yoo mab) is a monoclonal antibody. It is used to treat many types of cancer. This medicine may be used for other purposes; ask your health care provider or pharmacist if you have questions. COMMON BRAND NAME(S): Avastin, MVASI, Zirabev What should I tell my  care team before I take this medication? They need to know if you have any of these conditions: diabetes heart disease high blood pressure history of coughing up blood prior anthracycline chemotherapy (e.g., doxorubicin, daunorubicin, epirubicin) recent or ongoing radiation therapy recent  or planning to have surgery stroke an unusual or allergic reaction to bevacizumab, hamster proteins, mouse proteins, other medicines, foods, dyes, or preservatives pregnant or trying to get pregnant breast-feeding How should I use this medication? This medicine is for infusion into a vein. It is given by a health care professional in a hospital or clinic setting. Talk to your pediatrician regarding the use of this medicine in children. Special care may be needed. Overdosage: If you think you have taken too much of this medicine contact a poison control center or emergency room at once. NOTE: This medicine is only for you. Do not share this medicine with others. What if I miss a dose? It is important not to miss your dose. Call your doctor or health care professional if you are unable to keep an appointment. What may interact with this medication? Interactions are not expected. This list may not describe all possible interactions. Give your health care provider a list of all the medicines, herbs, non-prescription drugs, or dietary supplements you use. Also tell them if you smoke, drink alcohol, or use illegal drugs. Some items may interact with your medicine. What should I watch for while using this medication? Your condition will be monitored carefully while you are receiving this medicine. You will need important blood work and urine testing done while you are taking this medicine. This medicine may increase your risk to bruise or bleed. Call your doctor or health care professional if you notice any unusual bleeding. Before having surgery, talk to your health care provider to make sure it is ok. This drug can increase the risk of poor healing of your surgical site or wound. You will need to stop this drug for 28 days before surgery. After surgery, wait at least 28 days before restarting this drug. Make sure the surgical site or wound is healed enough before restarting this drug. Talk to your  health care provider if questions. Do not become pregnant while taking this medicine or for 6 months after stopping it. Women should inform their doctor if they wish to become pregnant or think they might be pregnant. There is a potential for serious side effects to an unborn child. Talk to your health care professional or pharmacist for more information. Do not breast-feed an infant while taking this medicine and for 6 months after the last dose. This medicine has caused ovarian failure in some women. This medicine may interfere with the ability to have a child. You should talk to your doctor or health care professional if you are concerned about your fertility. What side effects may I notice from receiving this medication? Side effects that you should report to your doctor or health care professional as soon as possible: allergic reactions like skin rash, itching or hives, swelling of the face, lips, or tongue chest pain or chest tightness chills coughing up blood high fever seizures severe constipation signs and symptoms of bleeding such as bloody or black, tarry stools; red or dark-brown urine; spitting up blood or brown material that looks like coffee grounds; red spots on the skin; unusual bruising or bleeding from the eye, gums, or nose signs and symptoms of a blood clot such as breathing problems; chest pain; severe, sudden headache; pain, swelling,  warmth in the leg signs and symptoms of a stroke like changes in vision; confusion; trouble speaking or understanding; severe headaches; sudden numbness or weakness of the face, arm or leg; trouble walking; dizziness; loss of balance or coordination stomach pain sweating swelling of legs or ankles vomiting weight gain Side effects that usually do not require medical attention (report to your doctor or health care professional if they continue or are bothersome): back pain changes in taste decreased appetite dry  skin nausea tiredness This list may not describe all possible side effects. Call your doctor for medical advice about side effects. You may report side effects to FDA at 1-800-FDA-1088. Where should I keep my medication? This drug is given in a hospital or clinic and will not be stored at home. NOTE: This sheet is a summary. It may not cover all possible information. If you have questions about this medicine, talk to your doctor, pharmacist, or health care provider.  2022 [employer] Standard (2019-01-22 10:50:46)

## 2021-01-19 NOTE — Progress Notes (Signed)
Patient denies new problems/concerns today.   °

## 2021-01-20 LAB — CA 125: Cancer Antigen (CA) 125: 49.4 U/mL — ABNORMAL HIGH (ref 0.0–38.1)

## 2021-01-26 ENCOUNTER — Ambulatory Visit
Admission: RE | Admit: 2021-01-26 | Discharge: 2021-01-26 | Disposition: A | Discharge status: Home / Self Care | Payer: Medicare Other | Source: Ambulatory Visit | Attending: Oncology | Admitting: Oncology

## 2021-01-26 ENCOUNTER — Other Ambulatory Visit: Payer: Self-pay

## 2021-01-26 DIAGNOSIS — Z90710 Acquired absence of both cervix and uterus: Secondary | ICD-10-CM | POA: Insufficient documentation

## 2021-01-26 DIAGNOSIS — R590 Localized enlarged lymph nodes: Secondary | ICD-10-CM | POA: Diagnosis not present

## 2021-01-26 DIAGNOSIS — R911 Solitary pulmonary nodule: Secondary | ICD-10-CM | POA: Insufficient documentation

## 2021-01-26 DIAGNOSIS — C569 Malignant neoplasm of unspecified ovary: Secondary | ICD-10-CM | POA: Diagnosis not present

## 2021-01-26 DIAGNOSIS — J984 Other disorders of lung: Secondary | ICD-10-CM | POA: Diagnosis not present

## 2021-01-26 DIAGNOSIS — I700 Atherosclerosis of aorta: Secondary | ICD-10-CM | POA: Insufficient documentation

## 2021-01-26 MED ORDER — IOHEXOL 300 MG/ML  SOLN
85.0000 mL | Freq: Once | INTRAMUSCULAR | Status: AC | PRN
  Administered 2021-01-26: 85 mL via INTRAVENOUS

## 2021-02-02 ENCOUNTER — Encounter: Payer: Self-pay | Admitting: Oncology

## 2021-02-02 ENCOUNTER — Inpatient Hospital Stay (HOSPITAL_BASED_OUTPATIENT_CLINIC_OR_DEPARTMENT_OTHER): Payer: Medicare Other | Admitting: Oncology

## 2021-02-02 ENCOUNTER — Inpatient Hospital Stay: Payer: Medicare Other

## 2021-02-02 ENCOUNTER — Other Ambulatory Visit: Payer: Self-pay

## 2021-02-02 VITALS — BP 158/84 | HR 60 | Temp 97.1°F | Resp 18 | Wt 119.9 lb

## 2021-02-02 DIAGNOSIS — D701 Agranulocytosis secondary to cancer chemotherapy: Secondary | ICD-10-CM

## 2021-02-02 DIAGNOSIS — Z5111 Encounter for antineoplastic chemotherapy: Secondary | ICD-10-CM

## 2021-02-02 DIAGNOSIS — C569 Malignant neoplasm of unspecified ovary: Secondary | ICD-10-CM

## 2021-02-02 DIAGNOSIS — D696 Thrombocytopenia, unspecified: Secondary | ICD-10-CM | POA: Diagnosis not present

## 2021-02-02 DIAGNOSIS — D6481 Anemia due to antineoplastic chemotherapy: Secondary | ICD-10-CM | POA: Diagnosis not present

## 2021-02-02 DIAGNOSIS — R911 Solitary pulmonary nodule: Secondary | ICD-10-CM | POA: Diagnosis not present

## 2021-02-02 DIAGNOSIS — Z95828 Presence of other vascular implants and grafts: Secondary | ICD-10-CM

## 2021-02-02 DIAGNOSIS — Z5112 Encounter for antineoplastic immunotherapy: Secondary | ICD-10-CM | POA: Diagnosis not present

## 2021-02-02 DIAGNOSIS — T451X5A Adverse effect of antineoplastic and immunosuppressive drugs, initial encounter: Secondary | ICD-10-CM

## 2021-02-02 LAB — COMPREHENSIVE METABOLIC PANEL
ALT: 11 U/L (ref 0–44)
AST: 17 U/L (ref 15–41)
Albumin: 4.3 g/dL (ref 3.5–5.0)
Alkaline Phosphatase: 57 U/L (ref 38–126)
Anion gap: 5 (ref 5–15)
BUN: 9 mg/dL (ref 8–23)
CO2: 25 mmol/L (ref 22–32)
Calcium: 9.6 mg/dL (ref 8.9–10.3)
Chloride: 97 mmol/L — ABNORMAL LOW (ref 98–111)
Creatinine, Ser: 0.52 mg/dL (ref 0.44–1.00)
GFR, Estimated: >60 mL/min (ref >60)
Glucose, Bld: 84 mg/dL (ref 70–99)
Potassium: 4.2 mmol/L (ref 3.5–5.1)
Sodium: 127 mmol/L — ABNORMAL LOW (ref 135–145)
Total Bilirubin: 0.8 mg/dL (ref 0.3–1.2)
Total Protein: 7.4 g/dL (ref 6.5–8.1)

## 2021-02-02 LAB — CBC WITH DIFFERENTIAL/PLATELET
Abs Immature Granulocytes: 0.03 10*3/uL (ref 0.00–0.07)
Basophils Absolute: 0 10*3/uL (ref 0.0–0.1)
Basophils Relative: 1 %
Eosinophils Absolute: 0.1 10*3/uL (ref 0.0–0.5)
Eosinophils Relative: 2 %
HCT: 33.2 % — ABNORMAL LOW (ref 36.0–46.0)
Hemoglobin: 11.2 g/dL — ABNORMAL LOW (ref 12.0–15.0)
Immature Granulocytes: 1 %
Lymphocytes Relative: 26 %
Lymphs Abs: 1.1 10*3/uL (ref 0.7–4.0)
MCH: 35.2 pg — ABNORMAL HIGH (ref 26.0–34.0)
MCHC: 33.7 g/dL (ref 30.0–36.0)
MCV: 104.4 fL — ABNORMAL HIGH (ref 80.0–100.0)
Monocytes Absolute: 0.6 10*3/uL (ref 0.1–1.0)
Monocytes Relative: 15 %
Neutro Abs: 2.4 10*3/uL (ref 1.7–7.7)
Neutrophils Relative %: 55 %
Platelets: 154 10*3/uL (ref 150–400)
RBC: 3.18 MIL/uL — ABNORMAL LOW (ref 3.87–5.11)
RDW: 15.3 % (ref 11.5–15.5)
WBC: 4.2 10*3/uL (ref 4.0–10.5)
nRBC: 0 % (ref 0.0–0.2)

## 2021-02-02 LAB — PROTEIN, URINE, RANDOM: Total Protein, Urine: <6 mg/dL

## 2021-02-02 MED ORDER — DOXORUBICIN HCL LIPOSOMAL CHEMO INJECTION 2 MG/ML
70.0000 mg | Freq: Once | INTRAVENOUS | Status: AC
  Administered 2021-02-02: 70 mg via INTRAVENOUS
  Filled 2021-02-02: qty 10

## 2021-02-02 MED ORDER — SODIUM CHLORIDE 0.9% FLUSH
10.0000 mL | Freq: Once | INTRAVENOUS | Status: AC
Start: 2021-02-02 — End: 2021-02-02
  Administered 2021-02-02: 10 mL via INTRAVENOUS
  Filled 2021-02-02: qty 10

## 2021-02-02 MED ORDER — PEGFILGRASTIM 6 MG/0.6ML ~~LOC~~ PSKT
6.0000 mg | PREFILLED_SYRINGE | Freq: Once | SUBCUTANEOUS | Status: AC
  Administered 2021-02-02: 6 mg via SUBCUTANEOUS
  Filled 2021-02-02: qty 0.6

## 2021-02-02 MED ORDER — HEPARIN SOD (PORK) LOCK FLUSH 100 UNIT/ML IV SOLN
500.0000 [IU] | Freq: Once | INTRAVENOUS | Status: AC
  Administered 2021-02-02: 500 [IU] via INTRAVENOUS
  Filled 2021-02-02: qty 5

## 2021-02-02 MED ORDER — DOXORUBICIN HCL LIPOSOMAL CHEMO INJECTION 2 MG/ML: 40.0000 mg/m2 | Freq: Once | INTRAVENOUS | Status: DC

## 2021-02-02 MED ORDER — SODIUM CHLORIDE 0.9 % IV SOLN
10.0000 mg | Freq: Once | INTRAVENOUS | Status: AC
  Administered 2021-02-02: 10 mg via INTRAVENOUS
  Filled 2021-02-02: qty 1

## 2021-02-02 MED ORDER — SODIUM CHLORIDE 0.9 % IV SOLN
10.0000 mg/kg | Freq: Once | INTRAVENOUS | Status: AC
  Administered 2021-02-02: 500 mg via INTRAVENOUS
  Filled 2021-02-02: qty 4

## 2021-02-02 MED ORDER — DEXTROSE 5 % IV SOLN
Freq: Once | INTRAVENOUS | Status: AC
  Filled 2021-02-02: qty 250

## 2021-02-02 NOTE — Progress Notes (Signed)
Pt here for follow up. No new concerns voiced.   

## 2021-02-02 NOTE — Progress Notes (Signed)
[facility] Follow up visit  Patient Care Team: Patient, No Pcp Per (Inactive) as PCP - General (General Practice) Clent Jacks, [name], [name], [name], MD as Referring Physician (Obstetrics and Gynecology)  CHIEF COMPLAINTS/PURPOSE OF Visit Follow up for chemotherapy for ovarian cancer.  HISTORY OF PRESENTING ILLNESS: Katie Hill 70 y.o. female presents for follow up of management of stage IIIC ovarian cancer. She underwent  Neoadjuvant chemotherapy of carbo and taxol x 4  # Patient had debulking surgery on 03/14/2017. She had a laparoscopy with conversion to laparotomy, total hysterectomy, with bilateral salpingo oophorectomy, right aortic lymph node dissection, omentectomy. Pathology showed small foci of residual disease in ovary.   Genetic testing negative for 83 genes on [facility]'s Multi-Cancer panel (ALK, APC, ATM, AXIN2, BAP1, BARD1, BLM, BMPR1A, BRCA1, BRCA2, BRIP1, CASR, CDC73, CDH1, CDK4, CDKN1B, CDKN1C, CDKN2A, CEBPA, CHEK2, CTNNA1, DICER1, DIS3L2, EGFR, EPCAM, FH, FLCN, GATA2, GPC3, GREM1, HOXB13, HRAS, KIT, MAX, MEN1, MET, MITF, MLH1, MSH2, MSH3, MSH6, MUTYH, NBN, NF1, NF2, NTHL1, PALB2, PDGFRA, PHOX2B, PMS2, POLD1, POLE, POT1, PRKAR1A, PTCH1, PTEN, RAD50, RAD51C, RAD51D, RB1, RECQL4, RET, RUNX1, SDHA, SDHAF2, SDHB, SDHC, SDHD, SMAD4, SMARCA4, SMARCB1, SMARCE1, STK11, SUFU, TERC, TERT, TMEM127, TP53, TSC1, TSC2, VHL, WRN, WT1).   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  Myraid testing negative for somatic BRACA1/2, positive for HRD.   # HRD positive, was referred to [facility] for clinical trials of Olarparib maintenance. She opted out.  #  Treatment:  Stage IIIC Ovarian cancer:  #s/p Carboplatin and taxol x 4 neoadjuvant chemotherapy followed by debulking surgery  03/14/2017 # 03/28/2017 S/p Adjuvant carbo and taxol x3   Local recurrence, CA125 one 46.3 03/15/2018-05/17/2018 Carboplatin and Taxol x 4. CA125 decrease from 49.7 to 6 after 4 cycles of treatment.  Started on Olarparib 361m BID on 05/17/2018.  # was seen by Dr. BFransisca Connorsfor further evaluation due to rising Ca1 25 and MRI abdomenPelvis on 11/05/2018 showed Interval progression of, now measuring 2.7 x 2.3 cm and concerning for progression of metastatic disease.retrocaval lymph node in the abdomen Olaparib was held for short period of time. Her CA125 trended down again.  Dr. BFransisca Connorsrecommending resume olaparib and continue monitor. If she has more significant progression of disease she may be a candidate for clinical trial or will be treated with other chemotherapy drugs including Doxil, gemcitabine and Avastin.  #10/06/2020, CT chest abdomen pelvis with contrast showed new lymph nodes in the prevascular space of the upper anterior mediastinum most consistent with metastasis.  Single new right lower lobe pulmonary nodule is concerning for early pulmonary metastasis.  Interval enlargement of the left periaortic retroperitoneal lymph node.  Stable adjacent aorto caval adenopathy.  10/29/2020 MUGA LVEF normal 10/29/2020 Stopped Olarparib  11/03/2020 start Doxil + bevacizumab 01/14/21 Echocardiogram -LVEF 55-60%  INTERVAL HISTORY 70y.o. female with above oncology history reviewed by me today presents for evaluation for chemotherapy for treatment of ovarian cancer  Patient was accompanied by her husband.  Patient tolerates treatment well.  No shortness of breath, leg swelling, nausea vomiting diarrhea.     . Review of Systems  Constitutional:  Negative for appetite change, chills, fatigue and fever.  HENT:   Negative for hearing loss and voice change.   Eyes:  Negative for eye problems.  Respiratory:  Negative for chest tightness and cough.  Cardiovascular:  Negative for chest pain.   Gastrointestinal:  Negative for abdominal distention, abdominal pain and blood in stool.  Endocrine: Negative for hot flashes.  Genitourinary:  Negative for difficulty urinating and frequency.   Musculoskeletal:  Negative for arthralgias.  Skin:  Negative for itching and rash.  Neurological:  Negative for extremity weakness.  Hematological:  Negative for adenopathy.  Psychiatric/Behavioral:  Negative for confusion.     [name] MEDICAL HISTORY: Past Medical History:  Diagnosis Date   Dysrhythmia    Genetic testing 03/28/2017   Multi-Cancer panel (83 genes) @ [facility] - No pathogenic mutations detected   High grade ovarian cancer ([place]) 11/20/2016   Pelvic mass in female     SURGICAL HISTORY: Past Surgical History:  Procedure Laterality Date   APPENDECTOMY     LAPAROSCOPY N/A 03/14/2017   Procedure: LAPAROSCOPY OPERATIVE;  Surgeon: Mellody Drown, MD;  Location: [facility];  Service: Gynecology;  Laterality: N/A;   LAPAROTOMY N/A 03/14/2017   Procedure: LAPAROTOMY;  Surgeon: Mellody Drown, MD;  Location: [facility];  Service: Gynecology;  Laterality: N/A;   LYMPH NODE DISSECTION N/A 03/14/2017   Procedure: LYMPH NODE DISSECTION;  Surgeon: Mellody Drown, MD;  Location: [facility];  Service: Gynecology;  Laterality: N/A;   OMENTECTOMY N/A 03/14/2017   Procedure: OMENTECTOMY;  Surgeon: Mellody Drown, MD;  Location: [facility];  Service: Gynecology;  Laterality: N/A;   PORTA CATH INSERTION N/A 11/27/2016   Procedure: Glori Luis Cath Insertion;  Surgeon: Algernon Huxley, MD;  Location: [facility];  Service: Cardiovascular;  Laterality: N/A;    SOCIAL HISTORY: Social History   Tobacco Use   Smoking status: Never   Smokeless tobacco: Never  Vaping Use   Vaping Use: Never used  Substance Use Topics   Alcohol use: Not Currently   Drug use: No     FAMILY HISTORY Family History  Problem Relation Age of Onset   Throat cancer Cousin    Throat cancer Cousin    Leukemia Cousin      ALLERGIES:  is allergic to omeprazole.  MEDICATIONS:  Current Outpatient Medications  Medication Sig Dispense Refill   hydrocortisone cream 0.5 % Apply 1 application topically 3 (three) times daily. 30 g 0   lidocaine-prilocaine (EMLA) cream Apply 1 application topically as needed. Apply small amount to port site at least 1 hour prior to it being accessed, cover with plastic wrap 30 g 1   ondansetron (ZOFRAN) 8 MG tablet Take 1 tablet (8 mg total) by mouth every 8 (eight) hours as needed for nausea or vomiting. 60 tablet 1   No current facility-administered medications for this visit.    PHYSICAL EXAMINATION:  ECOG PERFORMANCE STATUS: 0 - Asymptomatic Vitals:   02/02/21 1014  BP: (!) 158/84  Pulse: 60  Resp: 18  Temp: (!) 97.1 F (36.2 C)    Filed Weights   02/02/21 1014  Weight: 119 lb 14.4 oz (54.4 kg)     Physical Exam Constitutional:      General: She is not in acute distress.    Appearance: She is not diaphoretic.     Comments: Thin built, she walks independantly  HENT:     Head: Normocephalic and atraumatic.     Nose: Nose normal.     Mouth/Throat:     Pharynx: No oropharyngeal exudate.  Eyes:     General: No scleral icterus.    Pupils: Pupils are equal, round, and reactive to light.  Neck:     Vascular: No  JVD.  Cardiovascular:     Rate and Rhythm: Normal rate and regular rhythm.     Heart sounds: No murmur heard. Pulmonary:     Effort: Pulmonary effort is normal. No respiratory distress.     Breath sounds: Normal breath sounds. No rales.  Chest:     Chest wall: No tenderness.  Abdominal:     General: Bowel sounds are normal. There is no distension.     Palpations: Abdomen is soft.     Tenderness: There is no abdominal tenderness.  Musculoskeletal:        General: Normal range of motion.     Cervical back: Normal range of motion and neck supple.  Lymphadenopathy:     Cervical: No cervical adenopathy.  Skin:    General: Skin is warm and dry.      Findings: No erythema or rash.     Comments: Right anterior medi port +   Neurological:     Mental Status: She is alert and oriented to person, place, and time.     Cranial Nerves: No cranial nerve deficit.     Motor: No abnormal muscle tone.     Coordination: Coordination normal.  Psychiatric:        Mood and Affect: Affect normal.        Judgment: Judgment normal.       LABORATORY DATA: I have personally reviewed the data as listed: CBC    Component Value Date/Time   WBC 4.2 02/02/2021 0914   RBC 3.18 (L) 02/02/2021 0914   HGB 11.2 (L) 02/02/2021 0914   HCT 33.2 (L) 02/02/2021 0914   PLT 154 02/02/2021 0914   MCV 104.4 (H) 02/02/2021 0914   MCH 35.2 (H) 02/02/2021 0914   MCHC 33.7 02/02/2021 0914   RDW 15.3 02/02/2021 0914   LYMPHSABS 1.1 02/02/2021 0914   MONOABS 0.6 02/02/2021 0914   EOSABS 0.1 02/02/2021 0914   BASOSABS 0.0 02/02/2021 0914   CMP Latest Ref Rng & Units 02/02/2021 01/19/2021 01/05/2021  Glucose 70 - 99 mg/dL 84 95 95  BUN 8 - 23 mg/dL _0 Creatinine 0.44 - 1.00 mg/dL 0.52 0.52 0.69  Sodium 135 - 145 mmol/L 127(L) 130(L) 130(L)  Potassium 3.5 - 5.1 mmol/L 4.2 4.0 4.1  Chloride 98 - 111 mmol/L 97(L) 101 99  CO2 22 - 32 mmol/L _1 Calcium 8.9 - 10.3 mg/dL 9.6 9.6 9.7  Total Protein 6.5 - 8.1 g/dL 7.4 6.8 7.2  Total Bilirubin 0.3 - 1.2 mg/dL 0.8 0.3 0.6  Alkaline Phos 38 - 126 U/L 57 67 61  AST 15 - 41 U/L _2 ALT 0 - 44 U/L _3 Pathology 11/16/2016 Surgical Pathology  CASE: [id number]  PATIENT: Katie Hill  Surgical Pathology Report   SPECIMEN SUBMITTED:  A. Retroperitoneal adenopathy, left  DIAGNOSIS:  A. LYMPH NODE, LEFT RETROPERITONEAL; CT-GUIDED CORE BIOPSY:  - METASTATIC HIGH-GRADE SEROUS CARCINOMA.  Pathology 03/14/2017   DIAGNOSIS:  A. OMENTUM; OMENTECTOMY:  - NO TUMOR SEEN.  - ONE NEGATIVE LYMPH NODE (0/1).   B.  RIGHT FALLOPIAN TUBE AND OVARY; SALPINGO-OOPHORECTOMY:  - SMALL FOCI OF HIGH  GRADE SEROUS CARCINOMA INVOLVING THE OVARY.  - MARKED THERAPY RELATED CHANGE.  - NO TUMOR SEEN IN THE FALLOPIAN TUBE.   C.  UTERUS, CERVIX, LEFT FALLOPIAN TUBE AND OVARY; HYSTERECTOMY AND LEFT  SALPINGO-OOPHORECTOMY:  - NABOTHIAN CYSTS.  - CYSTIC ATROPHY OF THE ENDOMETRIUM.  - SEROSAL ADHESIONS.  -  UNREMARKABLE FALLOPIAN TUBE.  - OVARY SHOWING TREATMENT RELATED CHANGE.   D.  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    RADIOGRAPHIC STUDIES: I have personally reviewed the radiological images as listed and agreed with the findings in the report. CT CHEST ABDOMEN PELVIS W CONTRAST  Result Date: 01/27/2021 CLINICAL DATA:  Ovarian cancer restaging, recurrent EXAM: CT CHEST, ABDOMEN, AND PELVIS WITH CONTRAST TECHNIQUE: Multidetector CT imaging of the chest, abdomen and pelvis was performed following the standard protocol during bolus administration of intravenous contrast. CONTRAST:  86m OMNIPAQUE IOHEXOL 300 MG/ML SOLN, additional oral enteric contrast COMPARISON:  10/06/2020 FINDINGS: CT CHEST FINDINGS Cardiovascular: Right chest port catheter. Normal heart size. No pericardial effusion. Mediastinum/Nodes: Unchanged enlarged left superior mediastinal and prevascular nodes, measuring up to 1.6 x 1.3 cm (series 2, image 15). Thyroid gland, trachea, and esophagus demonstrate no significant findings. Lungs/Pleura: Interval decrease in size of a nodule of the peripheral right lower lobe, now measuring no greater than 2 mm, previously 4 mm (series 3, image 94). Unchanged 6 mm nodule of the dependent left lower lobe (series 3, image 98). Minimal scarring of the dependent bilateral lung bases. No pleural effusion or pneumothorax. Musculoskeletal: No chest wall mass or suspicious bone lesions identified. CT ABDOMEN PELVIS FINDINGS Hepatobiliary: No solid liver abnormality is seen. No gallstones, gallbladder wall thickening, or  biliary dilatation. Pancreas: Unremarkable. No pancreatic ductal dilatation or surrounding inflammatory changes. Spleen: Normal in size without significant abnormality. Adrenals/Urinary Tract: Adrenal glands are unremarkable. Kidneys are normal, without renal calculi, solid lesion, or hydronephrosis. Bladder is unremarkable. Stomach/Bowel: Stomach is within normal limits. Appendix is not clearly visualized and may be surgically absent. Status post omentectomy. No evidence of bowel wall thickening, distention, or inflammatory changes. Vascular/Lymphatic: Scattered aortic atherosclerosis. Unchanged enlarged left retroperitoneal lymph nodes, measuring up to 2.1 x 1.5 cm, previously 2.2 x 1.6 cm (series 2, image 69). Reproductive: Status post hysterectomy and oophorectomy. Unchanged soft tissue nodule in the posterior pelvis measuring 2.8 x 1.6 cm (series 2, image 113). Other: No abdominal wall hernia or abnormality. Musculoskeletal: No acute or significant osseous findings. IMPRESSION: 1. Interval decrease in size of a nodule of the peripheral right lower lobe, now measuring no greater than 2 mm, previously 4 mm. This may reflect treatment response of a solitary pulmonary metastasis, although resolution of nonspecific infection inflammation is a differential consideration. An additional 6 mm nodule of the dependent left lower lobe remains unchanged. Attention on follow-up. 2. Unchanged enlarged left superior mediastinal and prevascular lymph nodes. 3. Unchanged enlarged left retroperitoneal lymph nodes. 4. Findings are consistent with stable nodal metastatic disease. 5. Unchanged soft tissue nodule in the posterior pelvis. 6. Status post hysterectomy, oophorectomy, and omentectomy. Aortic Atherosclerosis (ICD10-I70.0). Electronically Signed   By: ADelanna AhmadiM.D.   On: 01/27/2021 08:45   ECHOCARDIOGRAM COMPLETE  Result Date: 01/14/2021    ECHOCARDIOGRAM REPORT   Patient Name:   Katie AGNESDate of Exam:  01/14/2021 Medical Rec #:  [id number]       Height:       68.5 in Accession #:    [id number]      Weight:       119.0 lb Date of Birth:  [birth year]       BSA:          1.648 m Patient Age:    89years         BP:  125/72 mmHg Patient Gender: F                HR:           55 bpm. Exam Location:  [facility] Procedure: 2D Echo, Cardiac Doppler, Color Doppler and Strain Analysis Indications:     Chemo Z09  History:         Patient has no prior history of Echocardiogram examinations.                  Dysrhythmia, High grade ovarian cancer.  Sonographer:     Sherrie Sport Referring Phys:  [id number] [place] Diagnosing Phys: Kathlyn Sacramento MD  Sonographer Comments: Suboptimal apical window. Global longitudinal strain was attempted. IMPRESSIONS  1. Left ventricular ejection fraction, by estimation, is 55 to 60%. The left ventricle has normal function. The left ventricle has no regional wall motion abnormalities. Left ventricular diastolic parameters were normal.  2. Right ventricular systolic function is normal. The right ventricular size is normal. There is normal pulmonary artery systolic pressure.  3. The mitral valve is normal in structure. No evidence of mitral valve regurgitation. No evidence of mitral stenosis.  4. The aortic valve is normal in structure. Aortic valve regurgitation is not visualized. No aortic stenosis is present.  5. The inferior vena cava is normal in size with greater than 50% respiratory variability, suggesting right atrial pressure of 3 mmHg. FINDINGS  Left Ventricle: Left ventricular ejection fraction, by estimation, is 55 to 60%. The left ventricle has normal function. The left ventricle has no regional wall motion abnormalities. Global longitudinal strain performed but not reported based on interpreter judgement due to suboptimal tracking. The left ventricular internal cavity size was normal in size. There is no left ventricular hypertrophy. Left ventricular diastolic parameters were normal.  Right Ventricle: The right ventricular size is normal. No increase in right ventricular wall thickness. Right ventricular systolic function is normal. There is normal pulmonary artery systolic pressure. The tricuspid regurgitant velocity is 2.52 m/s, and  with an assumed right atrial pressure of 3 mmHg, the estimated right ventricular systolic pressure is 77.4 mmHg. Left Atrium: Left atrial size was normal in size. Right Atrium: Right atrial size was normal in size. Pericardium: There is no evidence of pericardial effusion. Mitral Valve: The mitral valve is normal in structure. No evidence of mitral valve regurgitation. No evidence of mitral valve stenosis. Tricuspid Valve: The tricuspid valve is normal in structure. Tricuspid valve regurgitation is trivial. No evidence of tricuspid stenosis. Aortic Valve: The aortic valve is normal in structure. Aortic valve regurgitation is not visualized. No aortic stenosis is present. Aortic valve mean gradient measures 3.0 mmHg. Aortic valve peak gradient measures 4.2 mmHg. Aortic valve area, by VTI measures 2.47 cm. Pulmonic Valve: The pulmonic valve was normal in structure. Pulmonic valve regurgitation is not visualized. No evidence of pulmonic stenosis. Aorta: The aortic root is normal in size and structure. Venous: The inferior vena cava is normal in size with greater than 50% respiratory variability, suggesting right atrial pressure of 3 mmHg. IAS/Shunts: No atrial level shunt detected by color flow Doppler.  LEFT VENTRICLE PLAX 2D LVIDd:         4.24 cm   Diastology LVIDs:         2.83 cm   LV e' medial:    6.96 cm/s LV PW:         0.92 cm   LV E/e' medial:  9.9 LV IVS:  0.88 cm   LV e' lateral:   7.18 cm/s LVOT diam:     2.00 cm   LV E/e' lateral: 9.6 LV SV:         48 LV SV Index:   29 LVOT Area:     3.14 cm                           3D Volume EF:                          3D EF:        54 %                          LV EDV:       493 ml                           LV ESV:       227 ml                          LV SV:        266 ml LEFT ATRIUM           Index        RIGHT ATRIUM           Index LA diam:      2.90 cm 1.76 cm/m   RA Area:     11.40 cm LA Vol (A2C): 42.2 ml 25.61 ml/m  RA Volume:   27.40 ml  16.63 ml/m LA Vol (A4C): 18.0 ml 10.92 ml/m  AORTIC VALVE                    PULMONIC VALVE AV Area (Vmax):    2.35 cm     PV Vmax:        0.70 m/s AV Area (Vmean):   2.14 cm     PV Peak grad:   1.9 mmHg AV Area (VTI):     2.47 cm     RVOT Peak grad: 2 mmHg AV Vmax:           103.00 cm/s AV Vmean:          74.400 cm/s AV VTI:            0.196 m AV Peak Grad:      4.2 mmHg AV Mean Grad:      3.0 mmHg LVOT Vmax:         77.00 cm/s LVOT Vmean:        50.700 cm/s LVOT VTI:          0.154 m LVOT/AV VTI ratio: 0.79  AORTA Ao Root diam: 3.10 cm MITRAL VALVE               TRICUSPID VALVE MV Area (PHT): 4.01 cm    TR Peak grad:   25.4 mmHg MV Decel Time: 189 msec    TR Vmax:        252.00 cm/s MV E velocity: 69.00 cm/s MV A velocity: 62.60 cm/s  SHUNTS MV E/A ratio:  1.10        Systemic VTI:  0.15 m  Systemic Diam: 2.00 cm Kathlyn Sacramento MD Electronically signed by Kathlyn Sacramento MD Signature Date/Time: 01/14/2021/1:08:59 PM    Final      ASSESSMENT/PLAN Cancer Staging Malignant neoplasm of ovary [facility]) Staging form: Ovary, Fallopian Tube, and Primary Peritoneal Carcinoma, AJCC 8th Edition - Clinical stage from 11/22/2016: Stage IIIC (cT3c, cN1b, cM0) - Signed by Earlie Server, MD on 11/22/2016 Stage used in treatment planning: Yes National guidelines used in treatment planning: Yes Type of national guideline used in treatment planning: NCCN  1. High grade ovarian cancer ([place])   2. Encounter for antineoplastic chemotherapy   3. Anemia associated with chemotherapy   4. Chemotherapy induced neutropenia (HCC)   5. Thrombocytopenia (HCC)   6. Lung nodule   : # Ovarian Cancer, local recurrence.  Currently on olaparib 300 mg twice daily. She  tolerates well.  CA 125 18.7-->61.2-->111-->90.5-->69--> 54.8--> 29.1-->47-->71.2-->67.7->73.2-->82.8-->70.4-->66.4--> 58.4--> 64.9--> 56.9--> 42.2-->47.7-->54.4-->68.2-> 97-->168-->70.3--> 57.4--> 49.4 01/26/2021, CT chest abdomen pelvis Labs reviewed and discussed with patient showed interval decrease in size of the peripheral right lower lobe nodule, 2 mm compared to 4 mm prior.  This may reflect treatment response of a solitary pulmonary metastasis or resolution of nonspecific infection.  Additional 6 mm nodule of the dependent left lower lobe is unchanged.  Unchanged enlarged left superior mediastinum and prevascular lymph nodes.  Unchanged enlarged left retroperitoneal lymph nodes. Overall stable disease.  CA125 trends down nicely. Proceed with cycle 4-day 1 Doxil and bevacizumab.  + G-CSF support  #Blood pressure is trending high.  We will close monitor. #Chemotherapy-induced neutropenia, resolved. She gets GCSF support.  Recommend patient to take Claritin for 4 days.  Chronic anemia, chemotherapy-induced.  Hemoglobin 10.1. Chronic macrocytosis with adequate B12 and folate  # Skin Rash, topical steroid cream as needed.. Stable symptoms.   Port-A-Cath in place Patient declined influenza vaccination.  RTF 2 weeks for lab MD  bevacizumab.    Earlie Server, MD, PhD Hematology Oncology [place] at [facility]  02/02/21

## 2021-02-02 NOTE — Patient Instructions (Signed)
[facility]  Discharge Instructions: Thank you for choosing [facility] to provide your oncology and hematology care.  If you have a lab appointment with the [facility], please go directly to the [facility] and check in at the registration area.  Wear comfortable clothing and clothing appropriate for easy access to any Portacath or PICC line.   We strive to give you quality time with your provider. You may need to reschedule your appointment if you arrive late (15 or more minutes).  Arriving late affects you and other patients whose appointments are after yours.  Also, if you miss three or more appointments without notifying the office, you may be dismissed from the clinic at the provider's discretion.      For prescription refill requests, have your pharmacy contact our office and allow 72 hours for refills to be completed.    Today you received the following chemotherapy and/or immunotherapy agents DOXIL, ZIRABEV, EN-pro      To help prevent nausea and vomiting after your treatment, we encourage you to take your nausea medication as directed.  BELOW ARE SYMPTOMS THAT SHOULD BE REPORTED IMMEDIATELY: *FEVER GREATER THAN 100.4 F (38 C) OR HIGHER *CHILLS OR SWEATING *NAUSEA AND VOMITING THAT IS NOT CONTROLLED WITH YOUR NAUSEA MEDICATION *UNUSUAL SHORTNESS OF BREATH *UNUSUAL BRUISING OR BLEEDING *URINARY PROBLEMS (pain or burning when urinating, or frequent urination) *BOWEL PROBLEMS (unusual diarrhea, constipation, pain near the anus) TENDERNESS IN MOUTH AND THROAT WITH OR WITHOUT PRESENCE OF ULCERS (sore throat, sores in mouth, or a toothache) UNUSUAL RASH, SWELLING OR PAIN  UNUSUAL VAGINAL DISCHARGE OR ITCHING   Items with * indicate a potential emergency and should be followed up as soon as possible or go to the Emergency Department if any problems should occur.  Please show the CHEMOTHERAPY ALERT CARD or IMMUNOTHERAPY ALERT CARD  at check-in to the Emergency Department and triage nurse.  Should you have questions after your visit or need to cancel or reschedule your appointment, please contact [facility]  [phone] and follow the prompts.  Office hours are 8:00 a.m. to 4:30 p.m. Monday - Friday. Please note that voicemails left after 4:00 p.m. may not be returned until the following business day.  We are closed weekends and major holidays. You have access to a nurse at all times for urgent questions. Please call the main number to the clinic [phone] and follow the prompts.  For any non-urgent questions, you may also contact your provider using MyChart. We now offer e-Visits for anyone 77 and older to request care online for non-urgent symptoms. For details visit [email or web address].   Also download the MyChart app! Go to the app store, search "MyChart", open the app, select [facility], and log in with your MyChart username and password.  Due to Covid, a mask is required upon entering the hospital/clinic. If you do not have a mask, one will be given to you upon arrival. For doctor visits, patients may have 1 support person aged 58 or older with them. For treatment visits, patients cannot have anyone with them due to current Covid guidelines and our immunocompromised population.   Doxorubicin Liposomal injection What is this medication? LIPOSOMAL DOXORUBICIN (LIP oh som al dox oh ROO bi sin) is a chemotherapy drug. This medicine is used to treat many kinds of cancer like Kaposi's sarcoma, multiple myeloma, and ovarian cancer. This medicine may be used for other purposes; ask your health care provider  or pharmacist if you have questions. COMMON BRAND NAME(S): Doxil, Lipodox What should I tell my care team before I take this medication? They need to know if you have any of these conditions: blood disorders heart disease infection (especially a virus infection such as  chickenpox, cold sores, or herpes) liver disease recent or ongoing radiation therapy an unusual or allergic reaction to doxorubicin, other chemotherapy agents, soybeans, other medicines, foods, dyes, or preservatives pregnant or trying to get pregnant breast-feeding How should I use this medication? This drug is given as an infusion into a vein. It is administered in a hospital or clinic by a specially trained health care professional. If you have pain, swelling, burning or any unusual feeling around the site of your injection, tell your health care professional right away. Talk to your pediatrician regarding the use of this medicine in children. Special care may be needed. Overdosage: If you think you have taken too much of this medicine contact a poison control center or emergency room at once. NOTE: This medicine is only for you. Do not share this medicine with others. What if I miss a dose? It is important not to miss your dose. Call your doctor or health care professional if you are unable to keep an appointment. What may interact with this medication? Do not take this medicine with any of the following medications: zidovudine This medicine may also interact with the following medications: medicines to increase blood counts like filgrastim, pegfilgrastim, sargramostim vaccines Talk to your doctor or health care professional before taking any of these medicines: acetaminophen aspirin ibuprofen ketoprofen naproxen This list may not describe all possible interactions. Give your health care provider a list of all the medicines, herbs, non-prescription drugs, or dietary supplements you use. Also tell them if you smoke, drink alcohol, or use illegal drugs. Some items may interact with your medicine. What should I watch for while using this medication? Your condition will be monitored carefully while you are receiving this medicine. You may need blood work done while you are taking this  medicine. This drug may make you feel generally unwell. This is not uncommon, as chemotherapy can affect healthy cells as well as cancer cells. Report any side effects. Continue your course of treatment even though you feel ill unless your doctor tells you to stop. Your urine may turn orange-red for a few days after your dose. This is not blood. If your urine is dark or brown, call your doctor. In some cases, you may be given additional medicines to help with side effects. Follow all directions for their use. Talk to your doctor about your risk of cancer. You may be more at risk for certain types of cancers if you take this medicine. Do not become pregnant while taking this medicine or for 6 months after stopping it. Women should inform their healthcare professional if they wish to become pregnant or think they may be pregnant. Men should not father a child while taking this medicine and for 6 months after stopping it. There is a potential for serious side effects to an unborn child. Talk to your health care professional or pharmacist for more information. Do not breast-feed an infant while taking this medicine. This medicine has caused ovarian failure in some women. This medicine may make it more difficult to get pregnant. Talk to your healthcare professional if you are concerned about your fertility. This medicine has caused decreased sperm counts in some men. This may make it more difficult to father  a child. Talk to your healthcare professional if you are concerned about your fertility. This medicine may cause a decrease in Co-Enzyme Q-10. You should make sure that you get enough Co-Enzyme Q-10 while you are taking this medicine. Discuss the foods you eat and the vitamins you take with your health care professional. What side effects may I notice from receiving this medication? Side effects that you should report to your doctor or health care professional as soon as possible: allergic reactions like  skin rash, itching or hives, swelling of the face, lips, or tongue low blood counts - this medicine may decrease the number of white blood cells, red blood cells and platelets. You may be at increased risk for infections and bleeding. signs of hand-foot syndrome - tingling or burning, redness, flaking, swelling, small blisters, or small sores on the palms of your hands or the soles of your feet signs of infection - fever or chills, cough, sore throat, pain or difficulty passing urine signs of decreased platelets or bleeding - bruising, pinpoint red spots on the skin, black, tarry stools, blood in the urine signs of decreased red blood cells - unusually weak or tired, fainting spells, lightheadedness back pain, chills, facial flushing, fever, headache, tightness in the chest or throat during the infusion breathing problems chest pain fast, irregular heartbeat mouth pain, redness, sores pain, swelling, redness at site where injected pain, tingling, numbness in the hands or feet swelling of ankles, feet, or hands vomitingBevacizumab injection What is this medication? BEVACIZUMAB (be va SIZ yoo mab) is a monoclonal antibody. It is used to treat many types of cancer. This medicine may be used for other purposes; ask your health care provider or pharmacist if you have questions. COMMON BRAND NAME(S): Avastin, MVASI, Noah Charon What should I tell my care team before I take this medication? They need to know if you have any of these conditions: diabetes heart disease high blood pressure history of coughing up blood prior anthracycline chemotherapy (e.g., doxorubicin, daunorubicin, epirubicin) recent or ongoing radiation therapy recent or planning to have surgery stroke an unusual or allergic reaction to bevacizumab, hamster proteins, mouse proteins, other medicines, foods, dyes, or preservatives pregnant or trying to get pregnant breast-feeding How should I use this medication? This medicine is  for infusion into a vein. It is given by a health care professional in a hospital or clinic setting. Talk to your pediatrician regarding the use of this medicine in children. Special care may be needed. Overdosage: If you think you have taken too much of this medicine contact a poison control center or emergency room at once. NOTE: This medicine is only for you. Do not share this medicine with others. What if I miss a dose? It is important not to miss your dose. Call your doctor or health care professional if you are unable to keep an appointment. What may interact with this medication? Interactions are not expected. This list may not describe all possible interactions. Give your health care provider a list of all the medicines, herbs, non-prescription drugs, or dietary supplements you use. Also tell them if you smoke, drink alcohol, or use illegal drugs. Some items may interact with your medicine. What should I watch for while using this medication? Your condition will be monitored carefully while you are receiving this medicine. You will need important blood work and urine testing done while you are taking this medicine. This medicine may increase your risk to bruise or bleed. Call your doctor or health care professional  if you notice any unusual bleeding. Before having surgery, talk to your health care provider to make sure it is ok. This drug can increase the risk of poor healing of your surgical site or wound. You will need to stop this drug for 28 days before surgery. After surgery, wait at least 28 days before restarting this drug. Make sure the surgical site or wound is healed enough before restarting this drug. Talk to your health care provider if questions. Do not become pregnant while taking this medicine or for 6 months after stopping it. Women should inform their doctor if they wish to become pregnant or think they might be pregnant. There is a potential for serious side effects to an  unborn child. Talk to your health care professional or pharmacist for more information. Do not breast-feed an infant while taking this medicine and for 6 months after the last dose. This medicine has caused ovarian failure in some women. This medicine may interfere with the ability to have a child. You should talk to your doctor or health care professional if you are concerned about your fertility. What side effects may I notice from receiving this medication? Side effects that you should report to your doctor or health care professional as soon as possible: allergic reactions like skin rash, itching or hives, swelling of the face, lips, or tongue chest pain or chest tightness chills coughing up blood high fever seizures severe constipation signs and symptoms of bleeding such as bloody or black, tarry stools; red or dark-brown urine; spitting up blood or brown material that looks like coffee grounds; red spots on the skin; unusual bruising or bleeding from the eye, gums, or nose signs and symptoms of a blood clot such as breathing problems; chest pain; severe, sudden headache; pain, swelling, warmth in the leg signs and symptoms of a stroke like changes in vision; confusion; trouble speaking or understanding; severe headaches; sudden numbness or weakness of the face, arm or leg; trouble walking; dizziness; loss of balance or coordination stomach pain sweating swelling of legs or ankles vomiting weight gain Side effects that usually do not require medical attention (report to your doctor or health care professional if they continue or are bothersome): back pain changes in taste decreased appetite dry skin nausea tiredness This list may not describe all possible side effects. Call your doctor for medical advice about side effects. You may report side effects to FDA at 1-800-FDA-1088. Where should I keep my medication? This drug is given in a hospital or clinic and will not be stored at  home. NOTE: This sheet is a summary. It may not cover all possible information. If you have questions about this medicine, talk to your doctor, pharmacist, or health care provider.  2022 Elsevier/Gold Standard (2019-01-22 10:50:46)  Side effects that usually do not require medical attention (report to your doctor or health care professional if they continue or are bothersome): diarrhea hair loss loss of appetite nail discoloration or damage nausea red or watery eyes red colored urine stomach upset This list may not describe all possible side effects. Call your doctor for medical advice about side effects. You may report side effects to FDA at 1-800-FDA-1088. Where should I keep my medication? This drug is given in a hospital or clinic and will not be stored at home. NOTE: This sheet is a summary. It may not cover all possible information. If you have questions about this medicine, talk to your doctor, pharmacist, or health care provider.  2022  Elsevier/Gold Standard (2017-12-03 15:13:26)   Pegfilgrastim injection What is this medication? PEGFILGRASTIM (PEG fil gra stim) is a long-acting granulocyte colony-stimulating factor that stimulates the growth of neutrophils, a type of white blood cell important in the body's fight against infection. It is used to reduce the incidence of fever and infection in patients with certain types of cancer who are receiving chemotherapy that affects the bone marrow, and to increase survival after being exposed to high doses of radiation. This medicine may be used for other purposes; ask your health care provider or pharmacist if you have questions. COMMON BRAND NAME(S): Rexene Edison, Ziextenzo What should I tell my care team before I take this medication? They need to know if you have any of these conditions: kidney disease latex allergy ongoing radiation therapy sickle cell disease skin reactions to acrylic adhesives (On-Body  Injector only) an unusual or allergic reaction to pegfilgrastim, filgrastim, other medicines, foods, dyes, or preservatives pregnant or trying to get pregnant breast-feeding How should I use this medication? This medicine is for injection under the skin. If you get this medicine at home, you will be taught how to prepare and give the pre-filled syringe or how to use the On-body Injector. Refer to the patient Instructions for Use for detailed instructions. Use exactly as directed. Tell your healthcare provider immediately if you suspect that the On-body Injector may not have performed as intended or if you suspect the use of the On-body Injector resulted in a missed or partial dose. It is important that you put your used needles and syringes in a special sharps container. Do not put them in a trash can. If you do not have a sharps container, call your pharmacist or healthcare provider to get one. Talk to your pediatrician regarding the use of this medicine in children. While this drug may be prescribed for selected conditions, precautions do apply. Overdosage: If you think you have taken too much of this medicine contact a poison control center or emergency room at once. NOTE: This medicine is only for you. Do not share this medicine with others. What if I miss a dose? It is important not to miss your dose. Call your doctor or health care professional if you miss your dose. If you miss a dose due to an On-body Injector failure or leakage, a new dose should be administered as soon as possible using a single prefilled syringe for manual use. What may interact with this medication? Interactions have not been studied. This list may not describe all possible interactions. Give your health care provider a list of all the medicines, herbs, non-prescription drugs, or dietary supplements you use. Also tell them if you smoke, drink alcohol, or use illegal drugs. Some items may interact with your medicine. What  should I watch for while using this medication? Your condition will be monitored carefully while you are receiving this medicine. You may need blood work done while you are taking this medicine. Talk to your health care provider about your risk of cancer. You may be more at risk for certain types of cancer if you take this medicine. If you are going to need a MRI, CT scan, or other procedure, tell your doctor that you are using this medicine (On-Body Injector only). What side effects may I notice from receiving this medication? Side effects that you should report to your doctor or health care professional as soon as possible: allergic reactions (skin rash, itching or hives, swelling of the face, lips,  or tongue) back pain dizziness fever pain, redness, or irritation at site where injected pinpoint red spots on the skin red or dark-brown urine shortness of breath or breathing problems stomach or side pain, or pain at the shoulder swelling tiredness trouble passing urine or change in the amount of urine unusual bruising or bleeding Side effects that usually do not require medical attention (report to your doctor or health care professional if they continue or are bothersome): bone pain muscle pain This list may not describe all possible side effects. Call your doctor for medical advice about side effects. You may report side effects to FDA at 1-800-FDA-1088. Where should I keep my medication? Keep out of the reach of children. If you are using this medicine at home, you will be instructed on how to store it. Throw away any unused medicine after the expiration date on the label. NOTE: This sheet is a summary. It may not cover all possible information. If you have questions about this medicine, talk to your doctor, pharmacist, or health care provider.  2022 Elsevier/Gold Standard (2020-04-23 11:54:14)

## 2021-02-23 ENCOUNTER — Inpatient Hospital Stay: Payer: Medicare Other

## 2021-02-23 ENCOUNTER — Other Ambulatory Visit: Payer: Self-pay

## 2021-02-23 ENCOUNTER — Inpatient Hospital Stay (HOSPITAL_BASED_OUTPATIENT_CLINIC_OR_DEPARTMENT_OTHER): Payer: Medicare Other | Admitting: Oncology

## 2021-02-23 ENCOUNTER — Encounter: Payer: Self-pay | Admitting: Oncology

## 2021-02-23 ENCOUNTER — Inpatient Hospital Stay: Payer: Medicare Other | Attending: Oncology

## 2021-02-23 VITALS — BP 154/81 | HR 68 | Temp 97.6°F | Wt 121.0 lb

## 2021-02-23 DIAGNOSIS — C569 Malignant neoplasm of unspecified ovary: Secondary | ICD-10-CM | POA: Diagnosis not present

## 2021-02-23 DIAGNOSIS — I959 Hypotension, unspecified: Secondary | ICD-10-CM | POA: Insufficient documentation

## 2021-02-23 DIAGNOSIS — D6481 Anemia due to antineoplastic chemotherapy: Secondary | ICD-10-CM | POA: Insufficient documentation

## 2021-02-23 DIAGNOSIS — E871 Hypo-osmolality and hyponatremia: Secondary | ICD-10-CM | POA: Insufficient documentation

## 2021-02-23 DIAGNOSIS — Z79899 Other long term (current) drug therapy: Secondary | ICD-10-CM | POA: Diagnosis not present

## 2021-02-23 DIAGNOSIS — Z90710 Acquired absence of both cervix and uterus: Secondary | ICD-10-CM | POA: Insufficient documentation

## 2021-02-23 DIAGNOSIS — Z5112 Encounter for antineoplastic immunotherapy: Secondary | ICD-10-CM | POA: Diagnosis not present

## 2021-02-23 DIAGNOSIS — Z90722 Acquired absence of ovaries, bilateral: Secondary | ICD-10-CM | POA: Insufficient documentation

## 2021-02-23 DIAGNOSIS — Z5111 Encounter for antineoplastic chemotherapy: Secondary | ICD-10-CM

## 2021-02-23 LAB — CBC WITH DIFFERENTIAL/PLATELET
Abs Immature Granulocytes: 0.02 10*3/uL (ref 0.00–0.07)
Basophils Absolute: 0 10*3/uL (ref 0.0–0.1)
Basophils Relative: 1 %
Eosinophils Absolute: 0.1 10*3/uL (ref 0.0–0.5)
Eosinophils Relative: 2 %
HCT: 30.7 % — ABNORMAL LOW (ref 36.0–46.0)
Hemoglobin: 10.4 g/dL — ABNORMAL LOW (ref 12.0–15.0)
Immature Granulocytes: 1 %
Lymphocytes Relative: 22 %
Lymphs Abs: 0.8 10*3/uL (ref 0.7–4.0)
MCH: 34.6 pg — ABNORMAL HIGH (ref 26.0–34.0)
MCHC: 33.9 g/dL (ref 30.0–36.0)
MCV: 102 fL — ABNORMAL HIGH (ref 80.0–100.0)
Monocytes Absolute: 0.5 10*3/uL (ref 0.1–1.0)
Monocytes Relative: 12 %
Neutro Abs: 2.4 10*3/uL (ref 1.7–7.7)
Neutrophils Relative %: 62 %
Platelets: 141 10*3/uL — ABNORMAL LOW (ref 150–400)
RBC: 3.01 MIL/uL — ABNORMAL LOW (ref 3.87–5.11)
RDW: 15.5 % (ref 11.5–15.5)
WBC: 3.7 10*3/uL — ABNORMAL LOW (ref 4.0–10.5)
nRBC: 0 % (ref 0.0–0.2)

## 2021-02-23 LAB — COMPREHENSIVE METABOLIC PANEL
ALT: 11 U/L (ref 0–44)
AST: 17 U/L (ref 15–41)
Albumin: 4.2 g/dL (ref 3.5–5.0)
Alkaline Phosphatase: 60 U/L (ref 38–126)
Anion gap: 9 (ref 5–15)
BUN: 11 mg/dL (ref 8–23)
CO2: 19 mmol/L — ABNORMAL LOW (ref 22–32)
Calcium: 9 mg/dL (ref 8.9–10.3)
Chloride: 90 mmol/L — ABNORMAL LOW (ref 98–111)
Creatinine, Ser: 0.51 mg/dL (ref 0.44–1.00)
GFR, Estimated: >60 mL/min (ref >60)
Glucose, Bld: 88 mg/dL (ref 70–99)
Potassium: 3.9 mmol/L (ref 3.5–5.1)
Sodium: 118 mmol/L — CL (ref 135–145)
Total Bilirubin: 0.7 mg/dL (ref 0.3–1.2)
Total Protein: 7.1 g/dL (ref 6.5–8.1)

## 2021-02-23 LAB — OSMOLALITY: Osmolality: 269 mOsm/kg — ABNORMAL LOW (ref 275–295)

## 2021-02-23 LAB — PROTEIN, URINE, RANDOM: Total Protein, Urine: 7 mg/dL

## 2021-02-23 LAB — SODIUM, URINE, RANDOM: Sodium, Ur: 99 mmol/L

## 2021-02-23 LAB — OSMOLALITY, URINE: Osmolality, Ur: 453 mOsm/kg (ref 300–900)

## 2021-02-23 MED ORDER — AMLODIPINE BESYLATE 2.5 MG PO TABS: 2.5000 mg | ORAL_TABLET | Freq: Every day | ORAL | 0 refills | Status: DC

## 2021-02-23 MED ORDER — SODIUM CHLORIDE 0.9 % IV SOLN
Freq: Once | INTRAVENOUS | Status: AC
  Filled 2021-02-23: qty 250

## 2021-02-23 MED ORDER — HEPARIN SOD (PORK) LOCK FLUSH 100 UNIT/ML IV SOLN
500.0000 [IU] | Freq: Once | INTRAVENOUS | Status: AC | PRN
  Administered 2021-02-23: 500 [IU]
  Filled 2021-02-23: qty 5

## 2021-02-23 MED ORDER — SODIUM CHLORIDE 0.9 % IV SOLN
10.0000 mg/kg | Freq: Once | INTRAVENOUS | Status: AC
  Administered 2021-02-23: 500 mg via INTRAVENOUS
  Filled 2021-02-23: qty 16

## 2021-02-23 NOTE — Progress Notes (Signed)
[name]  Patient Care Team: Patient, No Pcp Per (Inactive) as PCP - General (General Practice) Clent Jacks, [name], [name], [name], MD as Referring Physician (Obstetrics and Gynecology)  CHIEF COMPLAINTS/PURPOSE OF Visit Follow up for chemotherapy for ovarian cancer.  HISTORY OF PRESENTING ILLNESS: Katie Hill 70 y.o. female presents for follow up of management of stage IIIC ovarian cancer. She underwent  Neoadjuvant chemotherapy of carbo and taxol x 4  # Patient had debulking surgery on 03/14/2017. She had a laparoscopy with conversion to laparotomy, total hysterectomy, with bilateral salpingo oophorectomy, right aortic lymph node dissection, omentectomy. Pathology showed small foci of residual disease in ovary.   Genetic testing negative for 83 genes on [facility]'s Multi-Cancer panel (ALK, APC, ATM, AXIN2, BAP1, BARD1, BLM, BMPR1A, BRCA1, BRCA2, BRIP1, CASR, CDC73, CDH1, CDK4, CDKN1B, CDKN1C, CDKN2A, CEBPA, CHEK2, CTNNA1, DICER1, DIS3L2, EGFR, EPCAM, FH, FLCN, GATA2, GPC3, GREM1, HOXB13, HRAS, KIT, MAX, MEN1, MET, MITF, MLH1, MSH2, MSH3, MSH6, MUTYH, NBN, NF1, NF2, NTHL1, PALB2, PDGFRA, PHOX2B, PMS2, POLD1, POLE, POT1, PRKAR1A, PTCH1, PTEN, RAD50, RAD51C, RAD51D, RB1, RECQL4, RET, RUNX1, SDHA, SDHAF2, SDHB, SDHC, SDHD, SMAD4, SMARCA4, SMARCB1, SMARCE1, STK11, SUFU, TERC, TERT, TMEM127, TP53, TSC1, TSC2, VHL, WRN, WT1).   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  Myraid testing negative for somatic BRACA1/2, positive for HRD.   # HRD positive, was referred to [facility] for clinical trials of Olarparib maintenance. She opted out.  #  Treatment:  Stage IIIC Ovarian cancer:  #s/p Carboplatin and taxol x 4 neoadjuvant chemotherapy followed by debulking surgery  03/14/2017 # 03/28/2017 S/p Adjuvant carbo and taxol x3   Local recurrence, CA125 one 46.3 03/15/2018-05/17/2018 Carboplatin and Taxol x 4. CA125 decrease from 49.7 to 6 after 4 cycles of treatment.  Started on Olarparib 378m BID on 05/17/2018.  # was seen by Dr. BFransisca Connorsfor further evaluation due to rising Ca1 25 and MRI abdomenPelvis on 11/05/2018 showed Interval progression of, now measuring 2.7 x 2.3 cm and concerning for progression of metastatic disease.retrocaval lymph node in the abdomen Olaparib was held for short period of time. Her CA125 trended down again.  Dr. BFransisca Connorsrecommending resume olaparib and continue monitor. If she has more significant progression of disease she may be a candidate for clinical trial or will be treated with other chemotherapy drugs including Doxil, gemcitabine and Avastin.  #10/06/2020, CT chest abdomen pelvis with contrast showed new lymph nodes in the prevascular space of the upper anterior mediastinum most consistent with metastasis.  Single new right lower lobe pulmonary nodule is concerning for early pulmonary metastasis.  Interval enlargement of the left periaortic retroperitoneal lymph node.  Stable adjacent aorto caval adenopathy.  10/29/2020 MUGA LVEF normal 10/29/2020 Stopped Olarparib  11/03/2020 start Doxil + bevacizumab 01/14/21 Echocardiogram -LVEF 55-60%  INTERVAL HISTORY 70y.o. female with above oncology history reviewed by me today presents for evaluation for chemotherapy for treatment of ovarian cancer  Patient was accompanied by her husband.   Patient denies any new complaints.  . Review of Systems  Constitutional:  Negative for appetite change, chills, fatigue and fever.  HENT:   Negative for hearing loss and voice change.   Eyes:  Negative for eye problems.  Respiratory:  Negative for chest tightness and cough.   Cardiovascular:  Negative for chest pain.  Gastrointestinal:  Negative for  abdominal distention, abdominal pain and blood in  stool.  Endocrine: Negative for hot flashes.  Genitourinary:  Negative for difficulty urinating and frequency.   Musculoskeletal:  Negative for arthralgias.  Skin:  Negative for itching and rash.  Neurological:  Negative for extremity weakness.  Hematological:  Negative for adenopathy.  Psychiatric/Behavioral:  Negative for confusion.     [name] MEDICAL HISTORY: Past Medical History:  Diagnosis Date   Dysrhythmia    Genetic testing 03/28/2017   Multi-Cancer panel (83 genes) @ [facility] - No pathogenic mutations detected   High grade ovarian cancer ([place]) 11/20/2016   Pelvic mass in female     SURGICAL HISTORY: Past Surgical History:  Procedure Laterality Date   APPENDECTOMY     LAPAROSCOPY N/A 03/14/2017   Procedure: LAPAROSCOPY OPERATIVE;  Surgeon: Mellody Drown, MD;  Location: [facility];  Service: Gynecology;  Laterality: N/A;   LAPAROTOMY N/A 03/14/2017   Procedure: LAPAROTOMY;  Surgeon: Mellody Drown, MD;  Location: [facility];  Service: Gynecology;  Laterality: N/A;   LYMPH NODE DISSECTION N/A 03/14/2017   Procedure: LYMPH NODE DISSECTION;  Surgeon: Mellody Drown, MD;  Location: [facility];  Service: Gynecology;  Laterality: N/A;   OMENTECTOMY N/A 03/14/2017   Procedure: OMENTECTOMY;  Surgeon: Mellody Drown, MD;  Location: [facility];  Service: Gynecology;  Laterality: N/A;   PORTA CATH INSERTION N/A 11/27/2016   Procedure: Glori Luis Cath Insertion;  Surgeon: Algernon Huxley, MD;  Location: [facility];  Service: Cardiovascular;  Laterality: N/A;    SOCIAL HISTORY: Social History   Tobacco Use   Smoking status: Never   Smokeless tobacco: Never  Vaping Use   Vaping Use: Never used  Substance Use Topics   Alcohol use: Not Currently   Drug use: No     FAMILY HISTORY Family History  Problem Relation Age of Onset   Throat cancer Cousin    Throat cancer Cousin    Leukemia Cousin     ALLERGIES:  is allergic to omeprazole.  MEDICATIONS:  Current Outpatient  Medications  Medication Sig Dispense Refill   amLODipine (NORVASC) 2.5 MG tablet Take 1 tablet (2.5 mg total) by mouth daily. 30 tablet 0   hydrocortisone cream 0.5 % Apply 1 application topically 3 (three) times daily. 30 g 0   lidocaine-prilocaine (EMLA) cream Apply 1 application topically as needed. Apply small amount to port site at least 1 hour prior to it being accessed, cover with plastic wrap 30 g 1   ondansetron (ZOFRAN) 8 MG tablet Take 1 tablet (8 mg total) by mouth every 8 (eight) hours as needed for nausea or vomiting. 60 tablet 1   No current facility-administered medications for this visit.    PHYSICAL EXAMINATION:  ECOG PERFORMANCE STATUS: 0 - Asymptomatic Vitals:   02/23/21 1316  BP: (!) 154/81  Pulse: 68  Temp: 97.6 F (36.4 C)    Filed Weights   02/23/21 1316  Weight: 121 lb (54.9 kg)     Physical Exam Constitutional:      General: She is not in acute distress.    Appearance: She is not diaphoretic.     Comments: Thin built, she walks independantly  HENT:     Head: Normocephalic and atraumatic.     Nose: Nose normal.     Mouth/Throat:     Pharynx: No oropharyngeal exudate.  Eyes:     General: No scleral icterus.    Pupils: Pupils are equal, round, and reactive to light.  Neck:  Vascular: No JVD.  Cardiovascular:     Rate and Rhythm: Normal rate and regular rhythm.     Heart sounds: No murmur heard. Pulmonary:     Effort: Pulmonary effort is normal. No respiratory distress.     Breath sounds: Normal breath sounds. No rales.  Chest:     Chest wall: No tenderness.  Abdominal:     General: Bowel sounds are normal. There is no distension.     Palpations: Abdomen is soft.     Tenderness: There is no abdominal tenderness.  Musculoskeletal:        General: Normal range of motion.     Cervical back: Normal range of motion and neck supple.  Lymphadenopathy:     Cervical: No cervical adenopathy.  Skin:    General: Skin is warm and dry.      Findings: No erythema or rash.     Comments: Right anterior medi port +   Neurological:     Mental Status: She is alert and oriented to person, place, and time.     Cranial Nerves: No cranial nerve deficit.     Motor: No abnormal muscle tone.     Coordination: Coordination normal.  Psychiatric:        Mood and Affect: Affect normal.        Judgment: Judgment normal.       LABORATORY DATA: I have personally reviewed the data as listed: CBC    Component Value Date/Time   WBC 3.7 (L) 02/23/2021 1256   RBC 3.01 (L) 02/23/2021 1256   HGB 10.4 (L) 02/23/2021 1256   HCT 30.7 (L) 02/23/2021 1256   PLT 141 (L) 02/23/2021 1256   MCV 102.0 (H) 02/23/2021 1256   MCH 34.6 (H) 02/23/2021 1256   MCHC 33.9 02/23/2021 1256   RDW 15.5 02/23/2021 1256   LYMPHSABS 0.8 02/23/2021 1256   MONOABS 0.5 02/23/2021 1256   EOSABS 0.1 02/23/2021 1256   BASOSABS 0.0 02/23/2021 1256   CMP Latest Ref Rng & Units 02/23/2021 02/02/2021 01/19/2021  Glucose 70 - 99 mg/dL 88 84 95  BUN 8 - 23 mg/dL '11 9 12  ' Creatinine 0.44 - 1.00 mg/dL 0.51 0.52 0.52  Sodium 135 - 145 mmol/L 118(LL) 127(L) 130(L)  Potassium 3.5 - 5.1 mmol/L 3.9 4.2 4.0  Chloride 98 - 111 mmol/L 90(L) 97(L) 101  CO2 22 - 32 mmol/L 19(L) 25 24  Calcium 8.9 - 10.3 mg/dL 9.0 9.6 9.6  Total Protein 6.5 - 8.1 g/dL 7.1 7.4 6.8  Total Bilirubin 0.3 - 1.2 mg/dL 0.7 0.8 0.3  Alkaline Phos 38 - 126 U/L 60 57 67  AST 15 - 41 U/L '17 17 16  ' ALT 0 - 44 U/L '11 11 9    ' Pathology 11/16/2016 Surgical Pathology  CASE: [id number]  PATIENT: Kennadee Ferriss  Surgical Pathology Report   SPECIMEN SUBMITTED:  A. Retroperitoneal adenopathy, left  DIAGNOSIS:  A. LYMPH NODE, LEFT RETROPERITONEAL; CT-GUIDED CORE BIOPSY:  - METASTATIC HIGH-GRADE SEROUS CARCINOMA.  Pathology 03/14/2017   DIAGNOSIS:  A. OMENTUM; OMENTECTOMY:  - NO TUMOR SEEN.  - ONE NEGATIVE LYMPH NODE (0/1).   B.  RIGHT FALLOPIAN TUBE AND OVARY; SALPINGO-OOPHORECTOMY:  - SMALL FOCI  OF HIGH GRADE SEROUS CARCINOMA INVOLVING THE OVARY.  - MARKED THERAPY RELATED CHANGE.  - NO TUMOR SEEN IN THE FALLOPIAN TUBE.   C.  UTERUS, CERVIX, LEFT FALLOPIAN TUBE AND OVARY; HYSTERECTOMY AND LEFT  SALPINGO-OOPHORECTOMY:  - NABOTHIAN CYSTS.  - CYSTIC ATROPHY OF THE ENDOMETRIUM.  -  SEROSAL ADHESIONS.  - UNREMARKABLE FALLOPIAN TUBE.  - OVARY SHOWING TREATMENT RELATED CHANGE.   D.  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    RADIOGRAPHIC STUDIES: I have personally reviewed the radiological images as listed and agreed with the findings in the report. CT CHEST ABDOMEN PELVIS W CONTRAST  Result Date: 01/27/2021 CLINICAL DATA:  Ovarian cancer restaging, recurrent EXAM: CT CHEST, ABDOMEN, AND PELVIS WITH CONTRAST TECHNIQUE: Multidetector CT imaging of the chest, abdomen and pelvis was performed following the standard protocol during bolus administration of intravenous contrast. CONTRAST:  54m OMNIPAQUE IOHEXOL 300 MG/ML SOLN, additional oral enteric contrast COMPARISON:  10/06/2020 FINDINGS: CT CHEST FINDINGS Cardiovascular: Right chest port catheter. Normal heart size. No pericardial effusion. Mediastinum/Nodes: Unchanged enlarged left superior mediastinal and prevascular nodes, measuring up to 1.6 x 1.3 cm (series 2, image 15). Thyroid gland, trachea, and esophagus demonstrate no significant findings. Lungs/Pleura: Interval decrease in size of a nodule of the peripheral right lower lobe, now measuring no greater than 2 mm, previously 4 mm (series 3, image 94). Unchanged 6 mm nodule of the dependent left lower lobe (series 3, image 98). Minimal scarring of the dependent bilateral lung bases. No pleural effusion or pneumothorax. Musculoskeletal: No chest wall mass or suspicious bone lesions identified. CT ABDOMEN PELVIS FINDINGS Hepatobiliary: No solid liver abnormality is seen. No gallstones, gallbladder wall thickening,  or biliary dilatation. Pancreas: Unremarkable. No pancreatic ductal dilatation or surrounding inflammatory changes. Spleen: Normal in size without significant abnormality. Adrenals/Urinary Tract: Adrenal glands are unremarkable. Kidneys are normal, without renal calculi, solid lesion, or hydronephrosis. Bladder is unremarkable. Stomach/Bowel: Stomach is within normal limits. Appendix is not clearly visualized and may be surgically absent. Status post omentectomy. No evidence of bowel wall thickening, distention, or inflammatory changes. Vascular/Lymphatic: Scattered aortic atherosclerosis. Unchanged enlarged left retroperitoneal lymph nodes, measuring up to 2.1 x 1.5 cm, previously 2.2 x 1.6 cm (series 2, image 69). Reproductive: Status post hysterectomy and oophorectomy. Unchanged soft tissue nodule in the posterior pelvis measuring 2.8 x 1.6 cm (series 2, image 113). Other: No abdominal wall hernia or abnormality. Musculoskeletal: No acute or significant osseous findings. IMPRESSION: 1. Interval decrease in size of a nodule of the peripheral right lower lobe, now measuring no greater than 2 mm, previously 4 mm. This may reflect treatment response of a solitary pulmonary metastasis, although resolution of nonspecific infection inflammation is a differential consideration. An additional 6 mm nodule of the dependent left lower lobe remains unchanged. Attention on follow-up. 2. Unchanged enlarged left superior mediastinal and prevascular lymph nodes. 3. Unchanged enlarged left retroperitoneal lymph nodes. 4. Findings are consistent with stable nodal metastatic disease. 5. Unchanged soft tissue nodule in the posterior pelvis. 6. Status post hysterectomy, oophorectomy, and omentectomy. Aortic Atherosclerosis (ICD10-I70.0). Electronically Signed   By: ADelanna AhmadiM.D.   On: 01/27/2021 08:45   ECHOCARDIOGRAM COMPLETE  Result Date: 01/14/2021    ECHOCARDIOGRAM REPORT   Patient Name:   Katie TULLODate of Exam:  01/14/2021 Medical Rec #:  [id number]       Height:       68.5 in Accession #:    [id number]      Weight:       119.0 lb Date of Birth:  1January 17, 1952       BSA:          1.648 m Patient Age:    69years  BP:           125/72 mmHg Patient Gender: F                HR:           55 bpm. Exam Location:  [facility] Procedure: 2D Echo, Cardiac Doppler, Color Doppler and Strain Analysis Indications:     Chemo Z09  History:         Patient has no prior history of Echocardiogram examinations.                  Dysrhythmia, High grade ovarian cancer.  Sonographer:     Sherrie Sport Referring Phys:  [id number] [place] Diagnosing Phys: Kathlyn Sacramento MD  Sonographer Comments: Suboptimal apical window. Global longitudinal strain was attempted. IMPRESSIONS  1. Left ventricular ejection fraction, by estimation, is 55 to 60%. The left ventricle has normal function. The left ventricle has no regional wall motion abnormalities. Left ventricular diastolic parameters were normal.  2. Right ventricular systolic function is normal. The right ventricular size is normal. There is normal pulmonary artery systolic pressure.  3. The mitral valve is normal in structure. No evidence of mitral valve regurgitation. No evidence of mitral stenosis.  4. The aortic valve is normal in structure. Aortic valve regurgitation is not visualized. No aortic stenosis is present.  5. The inferior vena cava is normal in size with greater than 50% respiratory variability, suggesting right atrial pressure of 3 mmHg. FINDINGS  Left Ventricle: Left ventricular ejection fraction, by estimation, is 55 to 60%. The left ventricle has normal function. The left ventricle has no regional wall motion abnormalities. Global longitudinal strain performed but not reported based on interpreter judgement due to suboptimal tracking. The left ventricular internal cavity size was normal in size. There is no left ventricular hypertrophy. Left ventricular diastolic parameters were normal.  Right Ventricle: The right ventricular size is normal. No increase in right ventricular wall thickness. Right ventricular systolic function is normal. There is normal pulmonary artery systolic pressure. The tricuspid regurgitant velocity is 2.52 m/s, and  with an assumed right atrial pressure of 3 mmHg, the estimated right ventricular systolic pressure is 41.7 mmHg. Left Atrium: Left atrial size was normal in size. Right Atrium: Right atrial size was normal in size. Pericardium: There is no evidence of pericardial effusion. Mitral Valve: The mitral valve is normal in structure. No evidence of mitral valve regurgitation. No evidence of mitral valve stenosis. Tricuspid Valve: The tricuspid valve is normal in structure. Tricuspid valve regurgitation is trivial. No evidence of tricuspid stenosis. Aortic Valve: The aortic valve is normal in structure. Aortic valve regurgitation is not visualized. No aortic stenosis is present. Aortic valve mean gradient measures 3.0 mmHg. Aortic valve peak gradient measures 4.2 mmHg. Aortic valve area, by VTI measures 2.47 cm. Pulmonic Valve: The pulmonic valve was normal in structure. Pulmonic valve regurgitation is not visualized. No evidence of pulmonic stenosis. Aorta: The aortic root is normal in size and structure. Venous: The inferior vena cava is normal in size with greater than 50% respiratory variability, suggesting right atrial pressure of 3 mmHg. IAS/Shunts: No atrial level shunt detected by color flow Doppler.  LEFT VENTRICLE PLAX 2D LVIDd:         4.24 cm   Diastology LVIDs:         2.83 cm   LV e' medial:    6.96 cm/s LV PW:         0.92 cm   LV  E/e' medial:  9.9 LV IVS:        0.88 cm   LV e' lateral:   7.18 cm/s LVOT diam:     2.00 cm   LV E/e' lateral: 9.6 LV SV:         48 LV SV Index:   29 LVOT Area:     3.14 cm                           3D Volume EF:                          3D EF:        54 %                          LV EDV:       493 ml                           LV ESV:       227 ml                          LV SV:        266 ml LEFT ATRIUM           Index        RIGHT ATRIUM           Index LA diam:      2.90 cm 1.76 cm/m   RA Area:     11.40 cm LA Vol (A2C): 42.2 ml 25.61 ml/m  RA Volume:   27.40 ml  16.63 ml/m LA Vol (A4C): 18.0 ml 10.92 ml/m  AORTIC VALVE                    PULMONIC VALVE AV Area (Vmax):    2.35 cm     PV Vmax:        0.70 m/s AV Area (Vmean):   2.14 cm     PV Peak grad:   1.9 mmHg AV Area (VTI):     2.47 cm     RVOT Peak grad: 2 mmHg AV Vmax:           103.00 cm/s AV Vmean:          74.400 cm/s AV VTI:            0.196 m AV Peak Grad:      4.2 mmHg AV Mean Grad:      3.0 mmHg LVOT Vmax:         77.00 cm/s LVOT Vmean:        50.700 cm/s LVOT VTI:          0.154 m LVOT/AV VTI ratio: 0.79  AORTA Ao Root diam: 3.10 cm MITRAL VALVE               TRICUSPID VALVE MV Area (PHT): 4.01 cm    TR Peak grad:   25.4 mmHg MV Decel Time: 189 msec    TR Vmax:        252.00 cm/s MV E velocity: 69.00 cm/s MV A velocity: 62.60 cm/s  SHUNTS MV E/A ratio:  1.10        Systemic VTI:  0.15 m  Systemic Diam: 2.00 cm Kathlyn Sacramento MD Electronically signed by Kathlyn Sacramento MD Signature Date/Time: 01/14/2021/1:08:59 PM    Final      ASSESSMENT/PLAN Cancer Staging Malignant neoplasm of ovary [facility]) Staging form: Ovary, Fallopian Tube, and Primary Peritoneal Carcinoma, AJCC 8th Edition - Clinical stage from 11/22/2016: Stage IIIC (cT3c, cN1b, cM0) - Signed by Earlie Server, MD on 11/22/2016 Stage used in treatment planning: Yes National guidelines used in treatment planning: Yes Type of national guideline used in treatment planning: NCCN  1. High grade ovarian cancer ([place])   2. Hyponatremia   3. Encounter for antineoplastic chemotherapy   : # Ovarian Cancer, local recurrence.  Currently on olaparib 300 mg twice daily. She tolerates well.  CA 125 18.7-->61.2-->111-->90.5-->69--> 54.8-->  29.1-->47-->71.2-->67.7->73.2-->82.8-->70.4-->66.4--> 58.4--> 64.9--> 56.9--> 42.2-->47.7-->54.4-->68.2-> 97-->168-->70.3--> 57.4--> 49.4 01/26/2021, CT chest abdomen pelvis-partial response-decrease of lung nodule.  Unchanged lymphadenopathy. Labs reviewed and discussed with patient.  Proceed with cycle 4-day 15 bevacizumab.    #Hypotension, likely secondary to bevacizumab.  Recommend patient to try low-dose Norvasc 2.5 mg daily if systolic blood pressure is persistently above 150 at home.   Chronic anemia, chemotherapy-induced.  Hemoglobin 10.4 Chronic macrocytosis with adequate B12 and folate  # Skin Rash, topical steroid cream as needed.. Stable symptoms.  #Hyponatremia, this is a chronic issue for her. Check urine and serum osmolarity, check urine sodium. Tsh, Free T4, cortisol IV fluid 1 L normal saline x1.  Port-A-Cath in place Patient declined influenza vaccination.  RTF 2 weeks for lab MD  bevacizumab.    Earlie Server, MD, PhD Hematology Oncology [facility]  02/23/21

## 2021-02-23 NOTE — Patient Instructions (Signed)
[facility]  Discharge Instructions: Thank you for choosing [facility] to provide your oncology and hematology care.  If you have a lab appointment with the [facility], please go directly to the [facility] and check in at the registration area.  Wear comfortable clothing and clothing appropriate for easy access to any Portacath or PICC line.   We strive to give you quality time with your provider. You may need to reschedule your appointment if you arrive late (15 or more minutes).  Arriving late affects you and other patients whose appointments are after yours.  Also, if you miss three or more appointments without notifying the office, you may be dismissed from the clinic at the provider's discretion.      For prescription refill requests, have your pharmacy contact our office and allow 72 hours for refills to be completed.    Today you received the following chemotherapy and/or immunotherapy agents: Noah Charon      To help prevent nausea and vomiting after your treatment, we encourage you to take your nausea medication as directed.  BELOW ARE SYMPTOMS THAT SHOULD BE REPORTED IMMEDIATELY: *FEVER GREATER THAN 100.4 F (38 C) OR HIGHER *CHILLS OR SWEATING *NAUSEA AND VOMITING THAT IS NOT CONTROLLED WITH YOUR NAUSEA MEDICATION *UNUSUAL SHORTNESS OF BREATH *UNUSUAL BRUISING OR BLEEDING *URINARY PROBLEMS (pain or burning when urinating, or frequent urination) *BOWEL PROBLEMS (unusual diarrhea, constipation, pain near the anus) TENDERNESS IN MOUTH AND THROAT WITH OR WITHOUT PRESENCE OF ULCERS (sore throat, sores in mouth, or a toothache) UNUSUAL RASH, SWELLING OR PAIN  UNUSUAL VAGINAL DISCHARGE OR ITCHING   Items with * indicate a potential emergency and should be followed up as soon as possible or go to the Emergency Department if any problems should occur.  Please show the CHEMOTHERAPY ALERT CARD or IMMUNOTHERAPY ALERT CARD at check-in to  the Emergency Department and triage nurse.  Should you have questions after your visit or need to cancel or reschedule your appointment, please contact [facility]  [phone] and follow the prompts.  Office hours are 8:00 a.m. to 4:30 p.m. Monday - Friday. Please note that voicemails left after 4:00 p.m. may not be returned until the following business day.  We are closed weekends and major holidays. You have access to a nurse at all times for urgent questions. Please call the main number to the clinic [phone] and follow the prompts.  For any non-urgent questions, you may also contact your provider using MyChart. We now offer e-Visits for anyone 31 and older to request care online for non-urgent symptoms. For details visit [email or web address].   Also download the MyChart app! Go to the app store, search "MyChart", open the app, select [facility], and log in with your MyChart username and password.  Due to Covid, a mask is required upon entering the hospital/clinic. If you do not have a mask, one will be given to you upon arrival. For doctor visits, patients may have 1 support person aged 59 or older with them. For treatment visits, patients cannot have anyone with them due to current Covid guidelines and our immunocompromised population. Hyponatremia Hyponatremia is when the amount of salt (sodium) in your blood is too low. When salt levels are low, your body cells may take in extra water. This can cause swelling. The swelling often affects the brain. What are the causes? Certain medical problems or conditions. Vomiting a lot. Having watery poop (diarrhea) often. Sweating too  much. Taking certain medicines or using illegal drugs. Fluids given through an IV tube. What increases the risk? Having heart, kidney, or liver failure. Having a medical condition that causes you to have watery poop a lot. Doing very hard exercises. Taking medicines that  affect the amount of salt that is in your blood. What are the signs or symptoms? Symptoms of this condition include: Headache. Feeling like you may vomit (nausea). Vomiting. Being very tired. Muscle weakness and cramps. Not wanting to eat as much as normal. Feeling weak or dizzy. Very bad symptoms of this condition include: Confusion. Feeling restless. Having a fast heart rate. Fainting. Seizures. Coma. How is this treated? Treatment for this condition depends on the cause. Treatment may include: Getting fluids through an IV tube that is put into one of your veins. Taking medicines to fix the salt levels in your blood. If medicines are causing the problem, your medicines will need to be changed. Limiting how much water or fluid you take in, in some cases. Monitoring in the hospital to watch your symptoms. Follow these instructions at home:  Take over-the-counter and prescription medicines only as told by your doctor. Many medicines can make this condition worse. Talk with your doctor about any medicines that you are taking. Do not drink alcohol. Keep all follow-up visits. Contact a doctor if: You feel more like you may vomit. You feel more tired. Your headache gets worse. You feel more confused. You feel weaker. Your symptoms go away and then they come back. Get help right away if: You have a seizure. You faint. You keep having watery poop. You keep vomiting. Summary Hyponatremia is when the amount of salt in your blood is too low. When salt levels are low, you can have swelling throughout the body. The swelling mostly affects the brain. Treatment depends on the cause. Treatment may include IV fluids and changing medicines. This information is not intended to replace advice given to you by your health care provider. Make sure you discuss any questions you have with your health care provider. Document Revised: 10/05/2020 Document Reviewed: 10/05/2020 Elsevier Patient  Education  [place]. Bevacizumab injection What is this medication? BEVACIZUMAB (be va SIZ yoo mab) is a monoclonal antibody. It is used to treat many types of cancer. This medicine may be used for other purposes; ask your health care provider or pharmacist if you have questions. COMMON BRAND NAME(S): Alymsys, Avastin, MVASI, Noah Charon What should I tell my care team before I take this medication? They need to know if you have any of these conditions: diabetes heart disease high blood pressure history of coughing up blood prior anthracycline chemotherapy (e.g., doxorubicin, daunorubicin, epirubicin) recent or ongoing radiation therapy recent or planning to have surgery stroke an unusual or allergic reaction to bevacizumab, hamster proteins, mouse proteins, other medicines, foods, dyes, or preservatives pregnant or trying to get pregnant breast-feeding How should I use this medication? This medicine is for infusion into a vein. It is given by a health care professional in a hospital or clinic setting. Talk to your pediatrician regarding the use of this medicine in children. Special care may be needed. Overdosage: If you think you have taken too much of this medicine contact a poison control center or emergency room at once. NOTE: This medicine is only for you. Do not share this medicine with others. What if I miss a dose? It is important not to miss your dose. Call your doctor or health care professional if you  are unable to keep an appointment. What may interact with this medication? Interactions are not expected. This list may not describe all possible interactions. Give your health care provider a list of all the medicines, herbs, non-prescription drugs, or dietary supplements you use. Also tell them if you smoke, drink alcohol, or use illegal drugs. Some items may interact with your medicine. What should I watch for while using this medication? Your condition will be  monitored carefully while you are receiving this medicine. You will need important blood work and urine testing done while you are taking this medicine. This medicine may increase your risk to bruise or bleed. Call your doctor or health care professional if you notice any unusual bleeding. Before having surgery, talk to your health care provider to make sure it is ok. This drug can increase the risk of poor healing of your surgical site or wound. You will need to stop this drug for 28 days before surgery. After surgery, wait at least 28 days before restarting this drug. Make sure the surgical site or wound is healed enough before restarting this drug. Talk to your health care provider if questions. Do not become pregnant while taking this medicine or for 6 months after stopping it. Women should inform their doctor if they wish to become pregnant or think they might be pregnant. There is a potential for serious side effects to an unborn child. Talk to your health care professional or pharmacist for more information. Do not breast-feed an infant while taking this medicine and for 6 months after the last dose. This medicine has caused ovarian failure in some women. This medicine may interfere with the ability to have a child. You should talk to your doctor or health care professional if you are concerned about your fertility. What side effects may I notice from receiving this medication? Side effects that you should report to your doctor or health care professional as soon as possible: allergic reactions like skin rash, itching or hives, swelling of the face, lips, or tongue chest pain or chest tightness chills coughing up blood high fever seizures severe constipation signs and symptoms of bleeding such as bloody or black, tarry stools; red or dark-brown urine; spitting up blood or brown material that looks like coffee grounds; red spots on the skin; unusual bruising or bleeding from the eye, gums, or  nose signs and symptoms of a blood clot such as breathing problems; chest pain; severe, sudden headache; pain, swelling, warmth in the leg signs and symptoms of a stroke like changes in vision; confusion; trouble speaking or understanding; severe headaches; sudden numbness or weakness of the face, arm or leg; trouble walking; dizziness; loss of balance or coordination stomach pain sweating swelling of legs or ankles vomiting weight gain Side effects that usually do not require medical attention (report to your doctor or health care professional if they continue or are bothersome): back pain changes in taste decreased appetite dry skin nausea tiredness This list may not describe all possible side effects. Call your doctor for medical advice about side effects. You may report side effects to FDA at 1-800-FDA-1088. Where should I keep my medication? This drug is given in a hospital or clinic and will not be stored at home. NOTE: This sheet is a summary. It may not cover all possible information. If you have questions about this medicine, talk to your doctor, pharmacist, or health care provider.  2022 Elsevier/Gold Standard (2020-12-14 00:00:00)

## 2021-02-24 ENCOUNTER — Inpatient Hospital Stay: Payer: Medicare Other

## 2021-02-24 DIAGNOSIS — E871 Hypo-osmolality and hyponatremia: Secondary | ICD-10-CM | POA: Diagnosis not present

## 2021-02-24 DIAGNOSIS — C569 Malignant neoplasm of unspecified ovary: Secondary | ICD-10-CM | POA: Diagnosis not present

## 2021-02-24 DIAGNOSIS — Z79899 Other long term (current) drug therapy: Secondary | ICD-10-CM | POA: Diagnosis not present

## 2021-02-24 DIAGNOSIS — Z5112 Encounter for antineoplastic immunotherapy: Secondary | ICD-10-CM | POA: Diagnosis not present

## 2021-02-24 DIAGNOSIS — D6481 Anemia due to antineoplastic chemotherapy: Secondary | ICD-10-CM | POA: Diagnosis not present

## 2021-02-24 DIAGNOSIS — I959 Hypotension, unspecified: Secondary | ICD-10-CM | POA: Diagnosis not present

## 2021-02-24 LAB — BASIC METABOLIC PANEL
Anion gap: 7 (ref 5–15)
BUN: 9 mg/dL (ref 8–23)
CO2: 22 mmol/L (ref 22–32)
Calcium: 9.1 mg/dL (ref 8.9–10.3)
Chloride: 100 mmol/L (ref 98–111)
Creatinine, Ser: 0.62 mg/dL (ref 0.44–1.00)
GFR, Estimated: >60 mL/min (ref >60)
Glucose, Bld: 103 mg/dL — ABNORMAL HIGH (ref 70–99)
Potassium: 4 mmol/L (ref 3.5–5.1)
Sodium: 129 mmol/L — ABNORMAL LOW (ref 135–145)

## 2021-02-24 LAB — CORTISOL: Cortisol, Plasma: 7.8 ug/dL

## 2021-02-24 LAB — T4, FREE: Free T4: 0.39 ng/dL — ABNORMAL LOW (ref 0.61–1.12)

## 2021-02-24 LAB — CA 125: Cancer Antigen (CA) 125: 33.7 U/mL (ref 0.0–38.1)

## 2021-02-24 LAB — TSH: TSH: 20.206 u[IU]/mL — ABNORMAL HIGH (ref 0.350–4.500)

## 2021-02-24 MED ORDER — SODIUM CHLORIDE 0.9 % IV SOLN
Freq: Once | INTRAVENOUS | Status: AC
  Filled 2021-02-24: qty 250

## 2021-02-24 MED ORDER — HEPARIN SOD (PORK) LOCK FLUSH 100 UNIT/ML IV SOLN
500.0000 [IU] | Freq: Once | INTRAVENOUS | Status: AC
  Administered 2021-02-24: 12:00:00 500 [IU] via INTRAVENOUS
  Filled 2021-02-24: qty 5

## 2021-02-24 MED ORDER — SODIUM CHLORIDE 0.9% FLUSH
10.0000 mL | Freq: Once | INTRAVENOUS | Status: AC
  Administered 2021-02-24: 11:00:00 10 mL via INTRAVENOUS
  Filled 2021-02-24: qty 10

## 2021-02-25 ENCOUNTER — Inpatient Hospital Stay: Payer: Medicare Other | Admitting: Oncology

## 2021-02-25 ENCOUNTER — Inpatient Hospital Stay: Payer: Medicare Other

## 2021-02-26 ENCOUNTER — Other Ambulatory Visit: Payer: Self-pay | Admitting: Oncology

## 2021-02-26 MED ORDER — LEVOTHYROXINE SODIUM 50 MCG PO TABS: 50.0000 ug | ORAL_TABLET | Freq: Every day | ORAL | 0 refills | Status: DC

## 2021-02-28 ENCOUNTER — Other Ambulatory Visit: Payer: Medicare Other

## 2021-02-28 ENCOUNTER — Telehealth: Payer: Self-pay

## 2021-02-28 ENCOUNTER — Inpatient Hospital Stay: Payer: Medicare Other

## 2021-02-28 NOTE — Telephone Encounter (Signed)
Informed patient and she verbalized understanding.  

## 2021-02-28 NOTE — Telephone Encounter (Signed)
-----   Message from Earlie Server, MD sent at 02/26/2021  2:51 PM EST ----- Let her know that her labs showed low thyroid function.  Recommend her to start synthroid 62mcg daily.

## 2021-03-02 ENCOUNTER — Ambulatory Visit: Payer: Medicare Other

## 2021-03-02 ENCOUNTER — Inpatient Hospital Stay (HOSPITAL_BASED_OUTPATIENT_CLINIC_OR_DEPARTMENT_OTHER): Payer: Medicare Other | Admitting: Obstetrics and Gynecology

## 2021-03-02 ENCOUNTER — Other Ambulatory Visit: Payer: Self-pay

## 2021-03-02 VITALS — BP 147/74 | HR 69 | Temp 98.8°F | Resp 17 | Wt 118.0 lb

## 2021-03-02 DIAGNOSIS — D6481 Anemia due to antineoplastic chemotherapy: Secondary | ICD-10-CM | POA: Diagnosis not present

## 2021-03-02 DIAGNOSIS — E871 Hypo-osmolality and hyponatremia: Secondary | ICD-10-CM | POA: Diagnosis not present

## 2021-03-02 DIAGNOSIS — C569 Malignant neoplasm of unspecified ovary: Secondary | ICD-10-CM

## 2021-03-02 DIAGNOSIS — Z5112 Encounter for antineoplastic immunotherapy: Secondary | ICD-10-CM | POA: Diagnosis not present

## 2021-03-02 DIAGNOSIS — I959 Hypotension, unspecified: Secondary | ICD-10-CM | POA: Diagnosis not present

## 2021-03-02 DIAGNOSIS — Z79899 Other long term (current) drug therapy: Secondary | ICD-10-CM | POA: Diagnosis not present

## 2021-03-02 NOTE — Progress Notes (Signed)
Patient here for oncology follow-up appointment, expresses no complaints or concerns at this time.    

## 2021-03-02 NOTE — Progress Notes (Signed)
Gynecologic Oncology Interval Visit   Referring Provider: Dr. 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 G0P0 female with locally recurrent advanced ovarian cancer, currently on olaparib since 2/20 with Dr Tasia Catchings, who presents to clinic for follow up.   She has no new complaints. Doing well with current treatment with Dr Tasia Catchings.   #10/06/2020, CT chest abdomen pelvis with contrast showed new lymph nodes in the prevascular space of the upper anterior mediastinum most consistent with metastasis.  Single new right lower lobe pulmonary nodule is concerning for early pulmonary metastasis.  Interval enlargement of the left periaortic retroperitoneal lymph node.  Stable adjacent aorto caval adenopathy.   10/29/2020 MUGA LVEF normal 10/29/2020 Stopped Olarparib  11/03/2020 start Doxil + bevacizumab 01/14/21 Echocardiogram -LVEF 55-60%  CA125 declined 155 to 33 most recently 11/22 after 3 cycles.    01/26/2021, CT chest abdomen pelvis-partial response-decrease of lung nodule.  Unchanged lymphadenopathy.  Oncology history She was diagnosed with high grade serous ovarian cancer (HRD) on CT directed node biopsy with partial bowel obstruction and extensive bulky adenopathy on 11/2016 and received 4 cycles of carbo-taxol with dramatic decline in CA 125 and improvement in imaging. She underwent interval debulking surgery to no gross residual disease on 03/14/2017 followed by re-initiation of chemotherapy on 03/28/17 with normalization of CA-125. She had a platinum sensitive recurrence and received carbo-taxol recurred and received carbo-taxol 03/15/2018-04/26/2018 followed by olaparib since 2/20 for somatic HRD positive malignancy. She was felt to have radiologic progression. Case was discussed at [facility] and recommendation was to continue parp inhibitor as she continued to be asymptomatic. We had discussed other options  for treatment should she have clinical progression.  CA125 continues to stay with in a range 47-82 without clear up trend. No significant symptoms. CA125 most recently 79 on 07/20/20.  CT 06/30/20 IMPRESSION: 1. Stable size and appearance of the pulmonary nodules, retroperitoneal adenopathy, and posterior pelvic soft tissue. No interval findings new or progressive disease.  2. Again seen is prominent stool volume in the right and transverse segments of the colon, possibly compatible with clinical constipation. 3. Aortic atherosclerosis.  Aortic Atherosclerosis (ICD10-I70.0).  Only complaint is some discomfort in pelvis with BMs. CT scan also suggests constipation.  Oncology History: Katie Hill is a pleasant. G0P0 female with advanced ovarian cancer.  She presented to the ER 11/13/2016 with symptoms of right lower quadrant pain for 3-4 weeks, associated with nausea, bloating, no change in weight, urinary frequency, and change in bowel habits with more diffucult and stringy BM's.  CT CHEST, ABDOMEN, AND PELVIS WITH CONTRAST IMPRESSION: 1. Large heterogeneous central pelvic mass measures up to 12.6 cm. The lesion generates substantial mass-effect on the uterus, bladder, and pelvic sidewall anatomy. The mass appears to invade the mid sigmoid colon. Etiology of the central pelvic mass is not definitive by CT, but ovarian primary is suspected. The lesion becomes indistinguishable from the posterior uterus on some images and uterine origin is possible, but the uterus does not appear to be the epicenter of the mass and appears to be more displaced by it. MRI of the pelvis without and with contrast may prove helpful to better delineate the relationship of the uterus to the central pelvic mass although it may not be able to definitively localize the origin. 2. Bulky retroperitoneal and left pelvic sidewall lymphadenopathy. The retroperitoneal lymphadenopathy generates substantial mass-effect on the IVC  and  left renal vein. The external iliac veins along each pelvic sidewall are markedly attenuated by the mass/lymphadenopathy. Patency of these vessels cannot be definitely confirmed on this exam.   11/16/16 LYMPH NODE, LEFT RETROPERITONEAL; CT-GUIDED CORE BIOPSY:  - METASTATIC HIGH-GRADE SEROUS CARCINOMA.   Decision was made to do neoadjuvant carbo/taxol chemotherapy.  CA125 fell from 4,382 to 64.6 (10/29).  CT scan showed dramatic response.  CT scan 10/14 Vascular/Lymphatic: Normal appearance of the abdominal aorta. Interval decrease in size of previously identified bulky retroperitoneal adenopathy. Index aortocaval node measures 1.6 x 2.6 cm, image 32 of series 2. Previously 4.4 x 5.3 cm. Index left periaortic nodal mass measures 2.2 x 1.3 cm, image 31 of series 2. Previously 3.7 x 4.7 cm. At the level of the bifurcation there is a low-attenuation nodal mass which measure 3.1 x 2.6 cm, image 44 of series 2. Previously 4.3 x 4.8 cm. Left common iliac node measures 1.2 cm, image 53 of series 2. Previously 2.2 cm. Left external iliac node measures 1.6 x 1.1 cm, image 67 of series 2. Previously 4.8 x 2.6 cm. Reproductive: Large mass centered around the uterus measures 9.4 x 5.5 cm, image 67 of series 2. Previously this measured 12.6 x 9.2 cm  On 03/14/2017 she underwent L/S converted to XL, TAH, BSO, right PA node resection, omentectomy, and LOA to no residual disease.   Pathology 03/14/2017  DIAGNOSIS:  A. OMENTUM; OMENTECTOMY:  - NO TUMOR SEEN.  - ONE NEGATIVE LYMPH NODE (0/1).   B.  RIGHT FALLOPIAN TUBE AND OVARY; SALPINGO-OOPHORECTOMY:  - SMALL FOCI OF HIGH GRADE SEROUS CARCINOMA INVOLVING THE OVARY.  - MARKED THERAPY RELATED CHANGE.  - NO TUMOR SEEN IN THE FALLOPIAN TUBE.   C.  UTERUS, CERVIX, LEFT FALLOPIAN TUBE AND OVARY; HYSTERECTOMY AND LEFT  SALPINGO-OOPHORECTOMY:  - NABOTHIAN CYSTS.  - CYSTIC ATROPHY OF THE ENDOMETRIUM.  - SEROSAL ADHESIONS.  - UNREMARKABLE FALLOPIAN TUBE.  -  OVARY SHOWING TREATMENT RELATED CHANGE.   D.  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   Then completed an additional 3 cycles of carbo/Taxol and CA 125 was 7.4 05/09/17.  CA 125  14 in 5/19  21 in 8/19.   CT scan 11/22/17 IMPRESSION: 1. Interval debulking surgery with decrease in retroperitoneal and extraperitoneal lymphadenopathy. In some areas the lymphadenopathy has resolved completely. Peritoneal implant seen along the falciform ligament on the prior study has decreased. 2. 4 x 2 cm collection of complex fluid or soft tissue in the right cul-de-sac is in the region of the complex mass lesion seen on the previous study. Given that there is some apparent enhancement peripherally, residual/recurrent disease is a concern and close attention will be required. 3. Stable tiny bilateral pulmonary nodules with no pleural effusion.  CA125  25.7 12/24/17 46.3 02/18/18  02/28/2018- CT C/A/P  1. Compared to the prior examination there has been interval enlargement of an aortocaval lymph node which currently measures 1.4 cm in short axis (axial image 75 of series 2). This lymph node is in close proximity to the previously resected retroperitoneal lymphadenopathy and is very concerning for an additional nodal metastasis. 2. Other findings in the chest, abdomen and pelvis appear very similar to prior examinations, as discussed above. 3. Aortic atherosclerosis.  She received 3 cycles of carbo-Taxol 03/15/2018-04/26/2018. CA 125 improved to 6.0. She started olaparib 300 mg BID 2/20 for platinum-sensitive recurrent ovarian cancer (HRD positive).   CA125 04/05/2018  15.3 04/26/2018       6.3 05/17/2018         6.0 06/07/2018        6.9 06/21/2018 7.0 07/05/2018 8.4 08/02/2018 18.7 08/29/2018 61.2 (H) 09/23/2018  111.0 (H) 10/21/2018 90.5 (H)    11/04/18 MRI Abd/Pelvis IMPRESSION: 1. Interval progression of a retrocaval lymph node  in the abdomen, now measuring 2.7 x 2.3 cm and concerning for progression of metastatic disease. 2. Aortocaval lymph node seen as decreased on the prior study is stable. This is unenlarged by MR criteria. 3. 3.3 x 1.6 cm homogeneous lesion posterior right pelvis is smaller than 02/28/2018. This is homogeneous and shows thin, smooth peripheral enhancement with no evidence for central enhancement and likely represents a chronic postoperative collection although metastatic disease not entirely excluded.  She continued on olaparib (she only stopped for about a 1/2 a day) and her CA125 values declined. She saw Dr. Tasia Catchings on 02/12/2019. She had chronic anemia and grade 1 thrombocytopenia, secondary to olaparib. Otherwise tolerated treatment very well.   05/08/2019 CT C/A/P IMPRESSION: 1. Mild progression of metastatic disease. Mildly enlarged aortocaval lymph node is newly enlarged since 11/05/2018 MRI and 08/28/2018 CT studies. Separate enlarged retrocaval lymph node is stable since 11/05/2018 MRI and increased since 08/28/2018 CT. 2. Stable posterior right pelvic soft tissue focus, characterized as probable chronic postsurgical collection on prior MRI. 3.  Aortic Atherosclerosis (ICD10-I70.0).  Reviewed imaging 08/2018; 10/2018; 04/2019; Measurement vary on imaging reads. She does not have a baseline CT scan from 05/2018 1.Enlarged retrocaval lymph node measures 2.2 cm short axis diameter (series 2/image 29), compared to 2.2 cm 11/05/2018 MRI using similar measuring technique and 1.5 cm on 08/28/2018 CT. 2. Posterior pelvic 3.2 x 1.9 cm soft tissue focus just to the right of midline (series 2/image 64), previously 3.5 x 1.6 cm on 08/28/2018 CT. 3.3 x 1.6 cm  On MRI 10/2018. 3. New enlarged PA node 1.5 cm (series 2/image 29)  CT 3/21  IMPRESSION: 1. Previously noted retrocaval lymph node is stable, while the adjacent aortocaval lymph node has slightly increased in size compared to the prior examination.  Previously noted soft tissue attenuation lesion in the posterior aspect of the anatomic pelvis appear stable compared to the prior study. No other new sites of metastatic disease are noted elsewhere in the chest, abdomen or pelvis. 2. Previously noted small pulmonary nodules are all stable in number and size compared to the prior examinations, favored to be benign. 3. Aortic atherosclerosis.   Genetic testing negative for 83 genes on [facility]'s Multi-Cancer panel (ALK, APC, ATM, AXIN2, BAP1, BARD1, BLM, BMPR1A, BRCA1, BRCA2, BRIP1, CASR, CDC73, CDH1, CDK4, CDKN1B, CDKN1C, CDKN2A, CEBPA, CHEK2, CTNNA1, DICER1, DIS3L2, EGFR, EPCAM, FH, FLCN, GATA2, GPC3, GREM1, HOXB13, HRAS, KIT, MAX, MEN1, MET, MITF, MLH1, MSH2, MSH3, MSH6, MUTYH, NBN, NF1, NF2, NTHL1, PALB2, PDGFRA, PHOX2B, PMS2, POLD1, POLE, POT1, PRKAR1A, PTCH1, PTEN, RAD50, RAD51C, RAD51D, RB1, RECQL4, RET, RUNX1, SDHA, SDHAF2, SDHB, SDHC, SDHD, SMAD4, SMARCA4, SMARCB1, SMARCE1, STK11, SUFU, TERC, TERT, TMEM127, TP53, TSC1, TSC2, VHL, WRN, WT1).   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 This is still considered a normal result.   Somatic tumor testing:  She has HRD positive cancer, negative for somatic or germline BRCA mutation.   Patient Active Problem List   Diagnosis Date Noted   Chemotherapy induced neutropenia ([place]) 01/05/2021   High grade ovarian cancer ([place]) 12/04/2019   Encounter for antineoplastic chemotherapy 04/14/2019   Goals of care, counseling/discussion  03/09/2018   Genetic testing 03/28/2017   S/P total abdominal hysterectomy and bilateral salpingo-oophorectomy 03/14/2017   Hyponatremia 12/12/2016   Dehydration 12/08/2016   Malignant neoplasm of ovary ([place]) 11/20/2016   Pelvic mass 11/15/2016   Past Medical History:  Diagnosis Date   Dysrhythmia    Genetic testing 03/28/2017   Multi-Cancer panel (83 genes) @ [facility] - No pathogenic mutations detected   High grade ovarian cancer  ([place]) 11/20/2016   Pelvic mass in female    Past Surgical History:  Procedure Laterality Date   APPENDECTOMY     LAPAROSCOPY N/A 03/14/2017   Procedure: LAPAROSCOPY OPERATIVE;  Surgeon: Mellody Drown, MD;  Location: [facility];  Service: Gynecology;  Laterality: N/A;   LAPAROTOMY N/A 03/14/2017   Procedure: LAPAROTOMY;  Surgeon: Mellody Drown, MD;  Location: [facility];  Service: Gynecology;  Laterality: N/A;   LYMPH NODE DISSECTION N/A 03/14/2017   Procedure: LYMPH NODE DISSECTION;  Surgeon: Mellody Drown, MD;  Location: [facility];  Service: Gynecology;  Laterality: N/A;   OMENTECTOMY N/A 03/14/2017   Procedure: OMENTECTOMY;  Surgeon: Mellody Drown, MD;  Location: [facility];  Service: Gynecology;  Laterality: N/A;   PORTA CATH INSERTION N/A 11/27/2016   Procedure: Glori Luis Cath Insertion;  Surgeon: Algernon Huxley, MD;  Location: [facility];  Service: Cardiovascular;  Laterality: N/A;   Past Gynecologic History:  Menarche: 17 Last Menstrual Period: 24 years ago History of Abnormal pap: no Last pap: years ago She does not have regular medical cancer and has not has screening mammogram, colonoscopy, or Pap smears. She has not had an abnormal Pap.   OB History     Gravida  0   Para  0   Term  0   Preterm  0   AB  0   Living  0      SAB  0   IAB  0   Ectopic  0   Multiple  0   Live Births  0          Family History  Problem Relation Age of Onset   Throat cancer Cousin    Throat cancer Cousin    Leukemia Cousin    Social History   Socioeconomic History   Marital status: Married    Spouse name: Not on file   Number of children: Not on file   Years of education: Not on file   Highest education level: Not on file  Occupational History   Not on file  Tobacco Use   Smoking status: Never   Smokeless tobacco: Never  Vaping Use   Vaping Use: Never used  Substance and Sexual Activity   Alcohol use: Not Currently   Drug use: No   Sexual activity:  Yes    Birth control/protection: Post-menopausal  Other Topics Concern   Not on file  Social History Narrative   Not on file   Social Determinants of Health   Financial Resource Strain: Not on file  Food Insecurity: Not on file  Transportation Needs: Not on file  Physical Activity: Not on file  Stress: Not on file  Social Connections: Not on file   Allergies  Allergen Reactions   Omeprazole Rash   Current Outpatient Medications on File Prior to Visit  Medication Sig Dispense Refill   amLODipine (NORVASC) 2.5 MG tablet Take 1 tablet (2.5 mg total) by mouth daily. 30 tablet 0   hydrocortisone cream 0.5 % Apply 1 application topically 3 (three) times daily. 30 g 0  levothyroxine (SYNTHROID) 50 MCG tablet Take 1 tablet (50 mcg total) by mouth daily before breakfast. 30 tablet 0   lidocaine-prilocaine (EMLA) cream Apply 1 application topically as needed. Apply small amount to port site at least 1 hour prior to it being accessed, cover with plastic wrap 30 g 1   ondansetron (ZOFRAN) 8 MG tablet Take 1 tablet (8 mg total) by mouth every 8 (eight) hours as needed for nausea or vomiting. 60 tablet 1   No current facility-administered medications on file prior to visit.   Review of Systems General:  no complaints Skin: no complaints Eyes: no complaints HEENT: no complaints Breasts: no complaints Pulmonary: no complaints Cardiac: no complaints Gastrointestinal: no complaints Genitourinary/Sexual: no complaints Ob/Gyn: no complaints Musculoskeletal: no complaints Hematology: no complaints Neurologic/Psych: no complaints   Objective:  Physical Examination:  Today's Vitals   03/02/21 1415 03/02/21 1416  BP:  (!) 147/74  Pulse:  69  Resp:  17  Temp:  98.8 F (37.1 C)  TempSrc:  Tympanic  SpO2:  100%  Weight:  118 lb (53.5 kg)  PainSc: 0-No pain    Body mass index is 17.68 kg/m.  ECOG Performance Status: 0 - no symptoms  GENERAL: Patient is a well appearing female in  no acute distress HEENT:  Sclera clear. Anicteric NODES:  Negative axillary, supraclavicular, inguinal lymph node survery LUNGS:  Clear to auscultation bilaterally.   HEART:  Regular rate and rhythm.  ABDOMEN:  Soft, nontender.  No hernias, incisions well healed. No masses or ascites EXTREMITIES:  No peripheral edema. Atraumatic. No cyanosis SKIN:  Clear with no obvious rashes or skin changes.  NEURO:  Nonfocal. Well oriented.  Appropriate affect.  Pelvic: EGBUS: no lesions Cervix: surgically absent Vagina: no lesions, no discharge or bleeding Uterus: surgically absent Adnexa: no palpable masses Rectovaginal: performed and confirmatory. No pelvic masses palpated.   Lab Results  Component Value Date   WBC 3.7 (L) 02/23/2021   HGB 10.4 (L) 02/23/2021   HCT 30.7 (L) 02/23/2021   MCV 102.0 (H) 02/23/2021   PLT 141 (L) 02/23/2021    Assessment:  Katie Hill is a 70 y.o. female diagnosed with high grade serous ovarian cancer (HRD) on CT directed node biopsy with partial bowel obstruction and extensive bulky adenopathy 8/18. s/p 4 cycles of carbo/taxol chemotherapy with dramatic decline in CA125 and improvement on CT scan. Interval debulking surgery to no gross residual on 03/14/2017.  Reinitiation of chemotherapy on 03/28/2017 with normalization of CA125.  Platinum-sensitive recurrence with normalization with re-induction of carbo-Taxol 03/15/2018-04/26/2018. On olaparib since 2/20 to 7/22 for HRD positive cancer.  CT scan 3/22 without evidence of progression.   11/03/2020 start Doxil + bevacizumab due to rising CA125. 01/14/21 Echocardiogram -LVEF 55-60%.  CA125 declined from 155 in 7/22 to 33 11/22 after 3 cycles. 01/26/2021, CT chest abdomen pelvis-partial response-decrease of lung nodule.  Unchanged lymphadenopathy. NED today.  She does not have a germline mutation in BRCA1/2 or other related gene, but somatic tumor testing is positive for HRD.    Medical co-morbidities  complicating care: prior abdominal surgery.  Plan:   Problem List Items Addressed This Visit       Endocrine   Malignant neoplasm of ovary ([place]) - Primary   She will continue to see Dr. Tasia Catchings for Doxil/bev chemotherapy.   Return to [place] clinic in 4-6 months.     Mellody Drown, MD  CC:  Gae Dry,  MD [address] [place], [place]  [place]  Dr. Tasia Catchings

## 2021-03-10 ENCOUNTER — Inpatient Hospital Stay: Payer: Medicare Other

## 2021-03-10 ENCOUNTER — Inpatient Hospital Stay: Payer: Medicare Other | Admitting: Oncology

## 2021-03-10 ENCOUNTER — Encounter: Payer: Self-pay | Admitting: Oncology

## 2021-03-10 ENCOUNTER — Inpatient Hospital Stay (HOSPITAL_BASED_OUTPATIENT_CLINIC_OR_DEPARTMENT_OTHER): Payer: Medicare Other | Admitting: Oncology

## 2021-03-10 ENCOUNTER — Inpatient Hospital Stay: Payer: Medicare Other | Attending: Oncology

## 2021-03-10 ENCOUNTER — Other Ambulatory Visit: Payer: Self-pay

## 2021-03-10 VITALS — BP 151/83 | HR 68 | Temp 97.7°F | Resp 18 | Wt 119.5 lb

## 2021-03-10 DIAGNOSIS — R971 Elevated cancer antigen 125 [CA 125]: Secondary | ICD-10-CM | POA: Insufficient documentation

## 2021-03-10 DIAGNOSIS — Z5189 Encounter for other specified aftercare: Secondary | ICD-10-CM | POA: Insufficient documentation

## 2021-03-10 DIAGNOSIS — D696 Thrombocytopenia, unspecified: Secondary | ICD-10-CM

## 2021-03-10 DIAGNOSIS — E871 Hypo-osmolality and hyponatremia: Secondary | ICD-10-CM | POA: Insufficient documentation

## 2021-03-10 DIAGNOSIS — D7589 Other specified diseases of blood and blood-forming organs: Secondary | ICD-10-CM | POA: Diagnosis not present

## 2021-03-10 DIAGNOSIS — T451X5A Adverse effect of antineoplastic and immunosuppressive drugs, initial encounter: Secondary | ICD-10-CM | POA: Diagnosis not present

## 2021-03-10 DIAGNOSIS — E039 Hypothyroidism, unspecified: Secondary | ICD-10-CM | POA: Diagnosis not present

## 2021-03-10 DIAGNOSIS — Z9221 Personal history of antineoplastic chemotherapy: Secondary | ICD-10-CM | POA: Diagnosis not present

## 2021-03-10 DIAGNOSIS — C569 Malignant neoplasm of unspecified ovary: Secondary | ICD-10-CM | POA: Diagnosis not present

## 2021-03-10 DIAGNOSIS — R591 Generalized enlarged lymph nodes: Secondary | ICD-10-CM | POA: Diagnosis not present

## 2021-03-10 DIAGNOSIS — D6481 Anemia due to antineoplastic chemotherapy: Secondary | ICD-10-CM

## 2021-03-10 DIAGNOSIS — Z5112 Encounter for antineoplastic immunotherapy: Secondary | ICD-10-CM | POA: Insufficient documentation

## 2021-03-10 DIAGNOSIS — C561 Malignant neoplasm of right ovary: Secondary | ICD-10-CM | POA: Insufficient documentation

## 2021-03-10 DIAGNOSIS — Z5111 Encounter for antineoplastic chemotherapy: Secondary | ICD-10-CM

## 2021-03-10 DIAGNOSIS — I10 Essential (primary) hypertension: Secondary | ICD-10-CM | POA: Diagnosis not present

## 2021-03-10 DIAGNOSIS — Z79899 Other long term (current) drug therapy: Secondary | ICD-10-CM | POA: Insufficient documentation

## 2021-03-10 DIAGNOSIS — R911 Solitary pulmonary nodule: Secondary | ICD-10-CM | POA: Diagnosis not present

## 2021-03-10 DIAGNOSIS — R21 Rash and other nonspecific skin eruption: Secondary | ICD-10-CM | POA: Insufficient documentation

## 2021-03-10 LAB — CBC WITH DIFFERENTIAL/PLATELET
Abs Immature Granulocytes: 0.01 10*3/uL (ref 0.00–0.07)
Basophils Absolute: 0 10*3/uL (ref 0.0–0.1)
Basophils Relative: 1 %
Eosinophils Absolute: 0.1 10*3/uL (ref 0.0–0.5)
Eosinophils Relative: 3 %
HCT: 34.9 % — ABNORMAL LOW (ref 36.0–46.0)
Hemoglobin: 11.7 g/dL — ABNORMAL LOW (ref 12.0–15.0)
Immature Granulocytes: 0 %
Lymphocytes Relative: 27 %
Lymphs Abs: 1.2 10*3/uL (ref 0.7–4.0)
MCH: 34.1 pg — ABNORMAL HIGH (ref 26.0–34.0)
MCHC: 33.5 g/dL (ref 30.0–36.0)
MCV: 101.7 fL — ABNORMAL HIGH (ref 80.0–100.0)
Monocytes Absolute: 0.5 10*3/uL (ref 0.1–1.0)
Monocytes Relative: 12 %
Neutro Abs: 2.6 10*3/uL (ref 1.7–7.7)
Neutrophils Relative %: 57 %
Platelets: 154 10*3/uL (ref 150–400)
RBC: 3.43 MIL/uL — ABNORMAL LOW (ref 3.87–5.11)
RDW: 14.6 % (ref 11.5–15.5)
WBC: 4.5 10*3/uL (ref 4.0–10.5)
nRBC: 0 % (ref 0.0–0.2)

## 2021-03-10 LAB — COMPREHENSIVE METABOLIC PANEL
ALT: 10 U/L (ref 0–44)
AST: 20 U/L (ref 15–41)
Albumin: 4.5 g/dL (ref 3.5–5.0)
Alkaline Phosphatase: 63 U/L (ref 38–126)
Anion gap: 10 (ref 5–15)
BUN: 12 mg/dL (ref 8–23)
CO2: 25 mmol/L (ref 22–32)
Calcium: 10.1 mg/dL (ref 8.9–10.3)
Chloride: 96 mmol/L — ABNORMAL LOW (ref 98–111)
Creatinine, Ser: 0.67 mg/dL (ref 0.44–1.00)
GFR, Estimated: >60 mL/min (ref >60)
Glucose, Bld: 119 mg/dL — ABNORMAL HIGH (ref 70–99)
Potassium: 4.5 mmol/L (ref 3.5–5.1)
Sodium: 131 mmol/L — ABNORMAL LOW (ref 135–145)
Total Bilirubin: 0.4 mg/dL (ref 0.3–1.2)
Total Protein: 7.8 g/dL (ref 6.5–8.1)

## 2021-03-10 LAB — PROTEIN, URINE, RANDOM: Total Protein, Urine: 6 mg/dL

## 2021-03-10 NOTE — Progress Notes (Signed)
Hematology/Oncology Progress  note [phone]      Patient Care Team: Patient, No Pcp Per (Inactive) as PCP - General (General Practice) Clent Jacks, [name], [name], [name], MD as Referring Physician (Obstetrics and Gynecology)  CHIEF COMPLAINTS/PURPOSE OF Visit Follow up for chemotherapy for ovarian cancer.  HISTORY OF PRESENTING ILLNESS: Katie Hill 70 y.o. female presents for follow up of management of stage IIIC ovarian cancer. She underwent  Neoadjuvant chemotherapy of carbo and taxol x 4  # Patient had debulking surgery on 03/14/2017. She had a laparoscopy with conversion to laparotomy, total hysterectomy, with bilateral salpingo oophorectomy, right aortic lymph node dissection, omentectomy. Pathology showed small foci of residual disease in ovary.   Genetic testing negative for 83 genes on [facility]'s Multi-Cancer panel (ALK, APC, ATM, AXIN2, BAP1, BARD1, BLM, BMPR1A, BRCA1, BRCA2, BRIP1, CASR, CDC73, CDH1, CDK4, CDKN1B, CDKN1C, CDKN2A, CEBPA, CHEK2, CTNNA1, DICER1, DIS3L2, EGFR, EPCAM, FH, FLCN, GATA2, GPC3, GREM1, HOXB13, HRAS, KIT, MAX, MEN1, MET, MITF, MLH1, MSH2, MSH3, MSH6, MUTYH, NBN, NF1, NF2, NTHL1, PALB2, PDGFRA, PHOX2B, PMS2, POLD1, POLE, POT1, PRKAR1A, PTCH1, PTEN, RAD50, RAD51C, RAD51D, RB1, RECQL4, RET, RUNX1, SDHA, SDHAF2, SDHB, SDHC, SDHD, SMAD4, SMARCA4, SMARCB1, SMARCE1, STK11, SUFU, TERC, TERT, TMEM127, TP53, TSC1, TSC2, VHL, WRN, WT1).   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  Myraid testing negative for somatic BRACA1/2, positive for HRD.   # HRD positive, was referred to [facility] for clinical trials of Olarparib maintenance. She opted out.  #  Treatment:  Stage IIIC Ovarian cancer:  #s/p Carboplatin and taxol x 4 neoadjuvant chemotherapy followed by debulking  surgery 03/14/2017 # 03/28/2017 S/p Adjuvant carbo and taxol x3   Local recurrence, CA125 one 46.3 03/15/2018-05/17/2018 Carboplatin and Taxol x 4. CA125 decrease from 49.7 to 6 after 4 cycles of treatment.  Started on Olarparib 352m BID on 05/17/2018.  # was seen by Dr. BFransisca Connorsfor further evaluation due to rising Ca1 25 and MRI abdomenPelvis on 11/05/2018 showed Interval progression of, now measuring 2.7 x 2.3 cm and concerning for progression of metastatic disease.retrocaval lymph node in the abdomen Olaparib was held for short period of time. Her CA125 trended down again.  Dr. BFransisca Connorsrecommending resume olaparib and continue monitor. If she has more significant progression of disease she may be a candidate for clinical trial or will be treated with other chemotherapy drugs including Doxil, gemcitabine and Avastin.  #10/06/2020, CT chest abdomen pelvis with contrast showed new lymph nodes in the prevascular space of the upper anterior mediastinum most consistent with metastasis.  Single new right lower lobe pulmonary nodule is concerning for early pulmonary metastasis.  Interval enlargement of the left periaortic retroperitoneal lymph node.  Stable adjacent aorto caval adenopathy.  10/29/2020 MUGA LVEF normal 10/29/2020 Stopped Olarparib  11/03/2020 start Doxil + bevacizumab 01/14/21 Echocardiogram -LVEF 55-60%  INTERVAL HISTORY 70y.o. female with above oncology history reviewed by me today presents for evaluation for chemotherapy for treatment of ovarian cancer  Patient was accompanied by her husband.   No new complaints.  Intermittent hiccups.  Spontaneously resolve  . Review of Systems  Constitutional:  Negative for appetite change, chills, fatigue and fever.  HENT:   Negative for hearing loss and voice change.   Eyes:  Negative for eye problems.  Respiratory:  Negative for chest tightness and cough.   Cardiovascular:  Negative for chest pain.  Gastrointestinal:  Negative for abdominal  distention, abdominal pain and blood in stool.  Endocrine: Negative for hot flashes.  Genitourinary:  Negative for difficulty urinating and frequency.   Musculoskeletal:  Negative for arthralgias.  Skin:  Negative for itching and rash.  Neurological:  Negative for extremity weakness.  Hematological:  Negative for adenopathy.  Psychiatric/Behavioral:  Negative for confusion.     [name] MEDICAL HISTORY: Past Medical History:  Diagnosis Date   Dysrhythmia    Genetic testing 03/28/2017   Multi-Cancer panel (83 genes) @ [facility] - No pathogenic mutations detected   High grade ovarian cancer ([place]) 11/20/2016   Pelvic mass in female     SURGICAL HISTORY: Past Surgical History:  Procedure Laterality Date   APPENDECTOMY     LAPAROSCOPY N/A 03/14/2017   Procedure: LAPAROSCOPY OPERATIVE;  Surgeon: Mellody Drown, MD;  Location: [facility];  Service: Gynecology;  Laterality: N/A;   LAPAROTOMY N/A 03/14/2017   Procedure: LAPAROTOMY;  Surgeon: Mellody Drown, MD;  Location: [facility];  Service: Gynecology;  Laterality: N/A;   LYMPH NODE DISSECTION N/A 03/14/2017   Procedure: LYMPH NODE DISSECTION;  Surgeon: Mellody Drown, MD;  Location: [facility];  Service: Gynecology;  Laterality: N/A;   OMENTECTOMY N/A 03/14/2017   Procedure: OMENTECTOMY;  Surgeon: Mellody Drown, MD;  Location: [facility];  Service: Gynecology;  Laterality: N/A;   PORTA CATH INSERTION N/A 11/27/2016   Procedure: Glori Luis Cath Insertion;  Surgeon: Algernon Huxley, MD;  Location: [facility];  Service: Cardiovascular;  Laterality: N/A;    SOCIAL HISTORY: Social History   Tobacco Use   Smoking status: Never   Smokeless tobacco: Never  Vaping Use   Vaping Use: Never used  Substance Use Topics   Alcohol use: Not Currently   Drug use: No     FAMILY HISTORY Family History  Problem Relation Age of Onset   Throat cancer Cousin    Throat cancer Cousin    Leukemia Cousin     ALLERGIES:  is allergic to  omeprazole.  MEDICATIONS:  Current Outpatient Medications  Medication Sig Dispense Refill   amLODipine (NORVASC) 2.5 MG tablet Take 1 tablet (2.5 mg total) by mouth daily. 30 tablet 0   hydrocortisone cream 0.5 % Apply 1 application topically 3 (three) times daily. 30 g 0   levothyroxine (SYNTHROID) 50 MCG tablet Take 1 tablet (50 mcg total) by mouth daily before breakfast. 30 tablet 0   lidocaine-prilocaine (EMLA) cream Apply 1 application topically as needed. Apply small amount to port site at least 1 hour prior to it being accessed, cover with plastic wrap 30 g 1   ondansetron (ZOFRAN) 8 MG tablet Take 1 tablet (8 mg total) by mouth every 8 (eight) hours as needed for nausea or vomiting. 60 tablet 1   No current facility-administered medications for this visit.    PHYSICAL EXAMINATION:  ECOG PERFORMANCE STATUS: 0 - Asymptomatic Vitals:   03/10/21 1444  BP: (!) 151/83  Pulse: 68  Resp: 18  Temp: 97.7 F (36.5 C)    Filed Weights   03/10/21 1444  Weight: 119 lb 8 oz (54.2 kg)     Physical Exam Constitutional:      General: She is not in acute distress.    Appearance: She is not diaphoretic.     Comments: Thin built, she walks independantly  HENT:     Head: Normocephalic and atraumatic.     Nose: Nose normal.     Mouth/Throat:     Pharynx: No  oropharyngeal exudate.  Eyes:     General: No scleral icterus.    Pupils: Pupils are equal, round, and reactive to light.  Neck:     Vascular: No JVD.  Cardiovascular:     Rate and Rhythm: Normal rate and regular rhythm.     Heart sounds: No murmur heard. Pulmonary:     Effort: Pulmonary effort is normal. No respiratory distress.     Breath sounds: Normal breath sounds. No rales.  Chest:     Chest wall: No tenderness.  Abdominal:     General: Bowel sounds are normal. There is no distension.     Palpations: Abdomen is soft.     Tenderness: There is no abdominal tenderness.  Musculoskeletal:        General: Normal range  of motion.     Cervical back: Normal range of motion and neck supple.  Lymphadenopathy:     Cervical: No cervical adenopathy.  Skin:    General: Skin is warm and dry.     Findings: No erythema or rash.     Comments: Right anterior medi port +   Neurological:     Mental Status: She is alert and oriented to person, place, and time.     Cranial Nerves: No cranial nerve deficit.     Motor: No abnormal muscle tone.     Coordination: Coordination normal.  Psychiatric:        Mood and Affect: Affect normal.        Judgment: Judgment normal.       LABORATORY DATA: I have personally reviewed the data as listed: CBC    Component Value Date/Time   WBC 4.5 03/10/2021 1354   RBC 3.43 (L) 03/10/2021 1354   HGB 11.7 (L) 03/10/2021 1354   HCT 34.9 (L) 03/10/2021 1354   PLT 154 03/10/2021 1354   MCV 101.7 (H) 03/10/2021 1354   MCH 34.1 (H) 03/10/2021 1354   MCHC 33.5 03/10/2021 1354   RDW 14.6 03/10/2021 1354   LYMPHSABS 1.2 03/10/2021 1354   MONOABS 0.5 03/10/2021 1354   EOSABS 0.1 03/10/2021 1354   BASOSABS 0.0 03/10/2021 1354   CMP Latest Ref Rng & Units 03/10/2021 02/24/2021 02/23/2021  Glucose 70 - 99 mg/dL 119(H) 103(H) 88  BUN 8 - 23 mg/dL '12 9 11  ' Creatinine 0.44 - 1.00 mg/dL 0.67 0.62 0.51  Sodium 135 - 145 mmol/L 131(L) 129(L) 118(LL)  Potassium 3.5 - 5.1 mmol/L 4.5 4.0 3.9  Chloride 98 - 111 mmol/L 96(L) 100 90(L)  CO2 22 - 32 mmol/L 25 22 19(L)  Calcium 8.9 - 10.3 mg/dL 10.1 9.1 9.0  Total Protein 6.5 - 8.1 g/dL 7.8 - 7.1  Total Bilirubin 0.3 - 1.2 mg/dL 0.4 - 0.7  Alkaline Phos 38 - 126 U/L 63 - 60  AST 15 - 41 U/L 20 - 17  ALT 0 - 44 U/L 10 - 11    Pathology 11/16/2016 Surgical Pathology  CASE: [id number]  PATIENT: Shey Moultry  Surgical Pathology Report   SPECIMEN SUBMITTED:  A. Retroperitoneal adenopathy, left  DIAGNOSIS:  A. LYMPH NODE, LEFT RETROPERITONEAL; CT-GUIDED CORE BIOPSY:  - METASTATIC HIGH-GRADE SEROUS CARCINOMA.  Pathology 03/14/2017    DIAGNOSIS:  A. OMENTUM; OMENTECTOMY:  - NO TUMOR SEEN.  - ONE NEGATIVE LYMPH NODE (0/1).   B.  RIGHT FALLOPIAN TUBE AND OVARY; SALPINGO-OOPHORECTOMY:  - SMALL FOCI OF HIGH GRADE SEROUS CARCINOMA INVOLVING THE OVARY.  - MARKED THERAPY RELATED CHANGE.  - NO TUMOR SEEN IN THE FALLOPIAN  TUBE.   C.  UTERUS, CERVIX, LEFT FALLOPIAN TUBE AND OVARY; HYSTERECTOMY AND LEFT  SALPINGO-OOPHORECTOMY:  - NABOTHIAN CYSTS.  - CYSTIC ATROPHY OF THE ENDOMETRIUM.  - SEROSAL ADHESIONS.  - UNREMARKABLE FALLOPIAN TUBE.  - OVARY SHOWING TREATMENT RELATED CHANGE.   D.  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    RADIOGRAPHIC STUDIES: I have personally reviewed the radiological images as listed and agreed with the findings in the report. CT CHEST ABDOMEN PELVIS W CONTRAST  Result Date: 01/27/2021 CLINICAL DATA:  Ovarian cancer restaging, recurrent EXAM: CT CHEST, ABDOMEN, AND PELVIS WITH CONTRAST TECHNIQUE: Multidetector CT imaging of the chest, abdomen and pelvis was performed following the standard protocol during bolus administration of intravenous contrast. CONTRAST:  24m OMNIPAQUE IOHEXOL 300 MG/ML SOLN, additional oral enteric contrast COMPARISON:  10/06/2020 FINDINGS: CT CHEST FINDINGS Cardiovascular: Right chest port catheter. Normal heart size. No pericardial effusion. Mediastinum/Nodes: Unchanged enlarged left superior mediastinal and prevascular nodes, measuring up to 1.6 x 1.3 cm (series 2, image 15). Thyroid gland, trachea, and esophagus demonstrate no significant findings. Lungs/Pleura: Interval decrease in size of a nodule of the peripheral right lower lobe, now measuring no greater than 2 mm, previously 4 mm (series 3, image 94). Unchanged 6 mm nodule of the dependent left lower lobe (series 3, image 98). Minimal scarring of the dependent bilateral lung bases. No pleural effusion or pneumothorax. Musculoskeletal: No  chest wall mass or suspicious bone lesions identified. CT ABDOMEN PELVIS FINDINGS Hepatobiliary: No solid liver abnormality is seen. No gallstones, gallbladder wall thickening, or biliary dilatation. Pancreas: Unremarkable. No pancreatic ductal dilatation or surrounding inflammatory changes. Spleen: Normal in size without significant abnormality. Adrenals/Urinary Tract: Adrenal glands are unremarkable. Kidneys are normal, without renal calculi, solid lesion, or hydronephrosis. Bladder is unremarkable. Stomach/Bowel: Stomach is within normal limits. Appendix is not clearly visualized and may be surgically absent. Status post omentectomy. No evidence of bowel wall thickening, distention, or inflammatory changes. Vascular/Lymphatic: Scattered aortic atherosclerosis. Unchanged enlarged left retroperitoneal lymph nodes, measuring up to 2.1 x 1.5 cm, previously 2.2 x 1.6 cm (series 2, image 69). Reproductive: Status post hysterectomy and oophorectomy. Unchanged soft tissue nodule in the posterior pelvis measuring 2.8 x 1.6 cm (series 2, image 113). Other: No abdominal wall hernia or abnormality. Musculoskeletal: No acute or significant osseous findings. IMPRESSION: 1. Interval decrease in size of a nodule of the peripheral right lower lobe, now measuring no greater than 2 mm, previously 4 mm. This may reflect treatment response of a solitary pulmonary metastasis, although resolution of nonspecific infection inflammation is a differential consideration. An additional 6 mm nodule of the dependent left lower lobe remains unchanged. Attention on follow-up. 2. Unchanged enlarged left superior mediastinal and prevascular lymph nodes. 3. Unchanged enlarged left retroperitoneal lymph nodes. 4. Findings are consistent with stable nodal metastatic disease. 5. Unchanged soft tissue nodule in the posterior pelvis. 6. Status post hysterectomy, oophorectomy, and omentectomy. Aortic Atherosclerosis (ICD10-I70.0). Electronically Signed    By: ADelanna AhmadiM.D.   On: 01/27/2021 08:45   ECHOCARDIOGRAM COMPLETE  Result Date: 01/14/2021    ECHOCARDIOGRAM REPORT   Patient Name:   EBEYONCA WISZDate of Exam: 01/14/2021 Medical Rec #:  [id number]       Height:       68.5 in Accession #:    [id number]      Weight:       119.0 lb Date of Birth:  [birth year]  BSA:          1.648 m Patient Age:    70 years         BP:           125/72 mmHg Patient Gender: F                HR:           55 bpm. Exam Location:  [facility] Procedure: 2D Echo, Cardiac Doppler, Color Doppler and Strain Analysis Indications:     Chemo Z09  History:         Patient has no prior history of Echocardiogram examinations.                  Dysrhythmia, High grade ovarian cancer.  Sonographer:     Sherrie Sport Referring Phys:  [id number] [place] Diagnosing Phys: Kathlyn Sacramento MD  Sonographer Comments: Suboptimal apical window. Global longitudinal strain was attempted. IMPRESSIONS  1. Left ventricular ejection fraction, by estimation, is 55 to 60%. The left ventricle has normal function. The left ventricle has no regional wall motion abnormalities. Left ventricular diastolic parameters were normal.  2. Right ventricular systolic function is normal. The right ventricular size is normal. There is normal pulmonary artery systolic pressure.  3. The mitral valve is normal in structure. No evidence of mitral valve regurgitation. No evidence of mitral stenosis.  4. The aortic valve is normal in structure. Aortic valve regurgitation is not visualized. No aortic stenosis is present.  5. The inferior vena cava is normal in size with greater than 50% respiratory variability, suggesting right atrial pressure of 3 mmHg. FINDINGS  Left Ventricle: Left ventricular ejection fraction, by estimation, is 55 to 60%. The left ventricle has normal function. The left ventricle has no regional wall motion abnormalities. Global longitudinal strain performed but not reported based on interpreter judgement due to  suboptimal tracking. The left ventricular internal cavity size was normal in size. There is no left ventricular hypertrophy. Left ventricular diastolic parameters were normal. Right Ventricle: The right ventricular size is normal. No increase in right ventricular wall thickness. Right ventricular systolic function is normal. There is normal pulmonary artery systolic pressure. The tricuspid regurgitant velocity is 2.52 m/s, and  with an assumed right atrial pressure of 3 mmHg, the estimated right ventricular systolic pressure is 82.5 mmHg. Left Atrium: Left atrial size was normal in size. Right Atrium: Right atrial size was normal in size. Pericardium: There is no evidence of pericardial effusion. Mitral Valve: The mitral valve is normal in structure. No evidence of mitral valve regurgitation. No evidence of mitral valve stenosis. Tricuspid Valve: The tricuspid valve is normal in structure. Tricuspid valve regurgitation is trivial. No evidence of tricuspid stenosis. Aortic Valve: The aortic valve is normal in structure. Aortic valve regurgitation is not visualized. No aortic stenosis is present. Aortic valve mean gradient measures 3.0 mmHg. Aortic valve peak gradient measures 4.2 mmHg. Aortic valve area, by VTI measures 2.47 cm. Pulmonic Valve: The pulmonic valve was normal in structure. Pulmonic valve regurgitation is not visualized. No evidence of pulmonic stenosis. Aorta: The aortic root is normal in size and structure. Venous: The inferior vena cava is normal in size with greater than 50% respiratory variability, suggesting right atrial pressure of 3 mmHg. IAS/Shunts: No atrial level shunt detected by color flow Doppler.  LEFT VENTRICLE PLAX 2D LVIDd:         4.24 cm   Diastology LVIDs:  2.83 cm   LV e' medial:    6.96 cm/s LV PW:         0.92 cm   LV E/e' medial:  9.9 LV IVS:        0.88 cm   LV e' lateral:   7.18 cm/s LVOT diam:     2.00 cm   LV E/e' lateral: 9.6 LV SV:         48 LV SV Index:   29 LVOT  Area:     3.14 cm                           3D Volume EF:                          3D EF:        54 %                          LV EDV:       493 ml                          LV ESV:       227 ml                          LV SV:        266 ml LEFT ATRIUM           Index        RIGHT ATRIUM           Index LA diam:      2.90 cm 1.76 cm/m   RA Area:     11.40 cm LA Vol (A2C): 42.2 ml 25.61 ml/m  RA Volume:   27.40 ml  16.63 ml/m LA Vol (A4C): 18.0 ml 10.92 ml/m  AORTIC VALVE                    PULMONIC VALVE AV Area (Vmax):    2.35 cm     PV Vmax:        0.70 m/s AV Area (Vmean):   2.14 cm     PV Peak grad:   1.9 mmHg AV Area (VTI):     2.47 cm     RVOT Peak grad: 2 mmHg AV Vmax:           103.00 cm/s AV Vmean:          74.400 cm/s AV VTI:            0.196 m AV Peak Grad:      4.2 mmHg AV Mean Grad:      3.0 mmHg LVOT Vmax:         77.00 cm/s LVOT Vmean:        50.700 cm/s LVOT VTI:          0.154 m LVOT/AV VTI ratio: 0.79  AORTA Ao Root diam: 3.10 cm MITRAL VALVE               TRICUSPID VALVE MV Area (PHT): 4.01 cm    TR Peak grad:   25.4 mmHg MV Decel Time: 189 msec    TR Vmax:        252.00 cm/s MV E velocity: 69.00 cm/s MV A velocity: 62.60 cm/s  SHUNTS MV E/A ratio:  1.10        Systemic VTI:  0.15 m                            Systemic Diam: 2.00 cm Kathlyn Sacramento MD Electronically signed by Kathlyn Sacramento MD Signature Date/Time: 01/14/2021/1:08:59 PM    Final      ASSESSMENT/PLAN  Cancer Staging  Malignant neoplasm of ovary [facility]) Staging form: Ovary, Fallopian Tube, and Primary Peritoneal Carcinoma, AJCC 8th Edition - Clinical stage from 11/22/2016: Stage IIIC (cT3c, cN1b, cM0) - Signed by Earlie Server, MD on 11/22/2016 Stage used in treatment planning: Yes National guidelines used in treatment planning: Yes Type of national guideline used in treatment planning: NCCN  1. Malignant neoplasm of ovary, unspecified laterality ([place])   2. Hyponatremia   3. Encounter for antineoplastic chemotherapy   4.  Anemia associated with chemotherapy   5. Thrombocytopenia ([place])   6. Acquired hypothyroidism   : # Ovarian Cancer, local recurrence.  Currently on olaparib 300 mg twice daily. She tolerates well.  CA 125 18.7-->61.2-->111-->90.5-->69--> 54.8--> 29.1-->47-->71.2-->67.7->73.2-->82.8-->70.4-->66.4--> 58.4--> 64.9--> 56.9--> 42.2-->47.7-->54.4-->68.2-> 97-->168-->70.3--> 57.4--> 49.4->33.7 01/26/2021, CT chest abdomen pelvis-partial response-decrease of lung nodule.  Unchanged lymphadenopathy. Labs reviewed and discussed with patient Proceed with Doxil and bevacizumab.-On pro Neulasta  #Hypertension, likely secondary to bevacizumab.  Recommend patient to try low-dose Norvasc 2.5 mg daily if systolic blood pressure is persistently above 150 at home.   Chronic anemia, chemotherapy-induced.  Hemoglobin 11.7 Chronic macrocytosis with adequate B12 and folate  # Skin Rash, topical steroid cream as needed.. Stable symptoms.  #Hyponatremia, this is a chronic issue for her.  Likely due to hypothyroidism  sodium is stable at 131. #Hypothyroidism, continue Synthroid 65mg daily.  Check TSH T4 in 4 weeks.   Port-A-Cath in place Patient declined influenza vaccination.  RTF 2 weeks for lab MD  bevacizumab.    ZEarlie Server MD, PhD 03/10/21

## 2021-03-10 NOTE — Progress Notes (Signed)
Pt here for follow up. No new concerns voiced.   

## 2021-03-11 ENCOUNTER — Inpatient Hospital Stay: Payer: Medicare Other

## 2021-03-11 VITALS — BP 156/75 | HR 74 | Temp 96.8°F

## 2021-03-11 DIAGNOSIS — C569 Malignant neoplasm of unspecified ovary: Secondary | ICD-10-CM

## 2021-03-11 DIAGNOSIS — C561 Malignant neoplasm of right ovary: Secondary | ICD-10-CM | POA: Diagnosis not present

## 2021-03-11 DIAGNOSIS — R971 Elevated cancer antigen 125 [CA 125]: Secondary | ICD-10-CM | POA: Diagnosis not present

## 2021-03-11 DIAGNOSIS — R911 Solitary pulmonary nodule: Secondary | ICD-10-CM | POA: Diagnosis not present

## 2021-03-11 DIAGNOSIS — Z5189 Encounter for other specified aftercare: Secondary | ICD-10-CM | POA: Diagnosis not present

## 2021-03-11 DIAGNOSIS — Z5112 Encounter for antineoplastic immunotherapy: Secondary | ICD-10-CM | POA: Diagnosis not present

## 2021-03-11 DIAGNOSIS — R591 Generalized enlarged lymph nodes: Secondary | ICD-10-CM | POA: Diagnosis not present

## 2021-03-11 MED ORDER — HEPARIN SOD (PORK) LOCK FLUSH 100 UNIT/ML IV SOLN
INTRAVENOUS | Status: AC
  Administered 2021-03-11: 500 [IU]
  Filled 2021-03-11: qty 5

## 2021-03-11 MED ORDER — PEGFILGRASTIM 6 MG/0.6ML ~~LOC~~ PSKT
6.0000 mg | PREFILLED_SYRINGE | Freq: Once | SUBCUTANEOUS | Status: AC
  Administered 2021-03-11: 6 mg via SUBCUTANEOUS
  Filled 2021-03-11: qty 0.6

## 2021-03-11 MED ORDER — HEPARIN SOD (PORK) LOCK FLUSH 100 UNIT/ML IV SOLN
500.0000 [IU] | Freq: Once | INTRAVENOUS | Status: AC | PRN
  Filled 2021-03-11: qty 5

## 2021-03-11 MED ORDER — DEXTROSE 5 % IV SOLN
Freq: Once | INTRAVENOUS | Status: AC
  Filled 2021-03-11: qty 250

## 2021-03-11 MED ORDER — DOXORUBICIN HCL LIPOSOMAL CHEMO INJECTION 2 MG/ML
40.0000 mg/m2 | Freq: Once | INTRAVENOUS | Status: AC
  Administered 2021-03-11: 64 mg via INTRAVENOUS
  Filled 2021-03-11: qty 25

## 2021-03-11 MED ORDER — SODIUM CHLORIDE 0.9% FLUSH
10.0000 mL | INTRAVENOUS | Status: DC | PRN
  Filled 2021-03-11: qty 10

## 2021-03-11 MED ORDER — DEXAMETHASONE SODIUM PHOSPHATE 10 MG/ML IJ SOLN
5.0000 mg | Freq: Once | INTRAMUSCULAR | Status: AC
  Administered 2021-03-11: 5 mg via INTRAVENOUS
  Filled 2021-03-11: qty 1

## 2021-03-11 MED ORDER — SODIUM CHLORIDE 0.9 % IV SOLN
10.0000 mg/kg | Freq: Once | INTRAVENOUS | Status: AC
  Administered 2021-03-11: 500 mg via INTRAVENOUS
  Filled 2021-03-11: qty 16

## 2021-03-11 MED ORDER — SODIUM CHLORIDE 0.9 % IV SOLN
Freq: Once | INTRAVENOUS | Status: AC
  Filled 2021-03-11: qty 250

## 2021-03-11 MED ORDER — SODIUM CHLORIDE 0.9 % IV SOLN
5.0000 mg | Freq: Once | INTRAVENOUS | Status: DC
  Filled 2021-03-11: qty 0.5

## 2021-03-11 NOTE — Patient Instructions (Addendum)
[facility] [place]  Discharge Instructions: Thank you for choosing [facility] to provide your oncology and hematology care.  If you have a lab appointment with the [facility], please go directly to the [facility] and check in at the registration area.  Wear comfortable clothing and clothing appropriate for easy access to any Portacath or PICC line.   We strive to give you quality time with your provider. You may need to reschedule your appointment if you arrive late (15 or more minutes).  Arriving late affects you and other patients whose appointments are after yours.  Also, if you miss three or more appointments without notifying the office, you may be dismissed from the clinic at the provider's discretion.      For prescription refill requests, have your pharmacy contact our office and allow 72 hours for refills to be completed.    Today you received the following chemotherapy and/or immunotherapy agents - doxil, bevacizumab      To help prevent nausea and vomiting after your treatment, we encourage you to take your nausea medication as directed.  BELOW ARE SYMPTOMS THAT SHOULD BE REPORTED IMMEDIATELY: *FEVER GREATER THAN 100.4 F (38 C) OR HIGHER *CHILLS OR SWEATING *NAUSEA AND VOMITING THAT IS NOT CONTROLLED WITH YOUR NAUSEA MEDICATION *UNUSUAL SHORTNESS OF BREATH *UNUSUAL BRUISING OR BLEEDING *URINARY PROBLEMS (pain or burning when urinating, or frequent urination) *BOWEL PROBLEMS (unusual diarrhea, constipation, pain near the anus) TENDERNESS IN MOUTH AND THROAT WITH OR WITHOUT PRESENCE OF ULCERS (sore throat, sores in mouth, or a toothache) UNUSUAL RASH, SWELLING OR PAIN  UNUSUAL VAGINAL DISCHARGE OR ITCHING   Items with * indicate a potential emergency and should be followed up as soon as possible or go to the Emergency Department if any problems should occur.  Please show the CHEMOTHERAPY ALERT CARD or IMMUNOTHERAPY ALERT CARD at  check-in to the Emergency Department and triage nurse.  Should you have questions after your visit or need to cancel or reschedule your appointment, please contact [facility] [place] [facility] [place]  [phone] and follow the prompts.  Office hours are 8:00 a.m. to 4:30 p.m. Monday - Friday. Please note that voicemails left after 4:00 p.m. may not be returned until the following business day.  We are closed weekends and major holidays. You have access to a nurse at all times for urgent questions. Please call the main number to the clinic [phone] and follow the prompts.  For any non-urgent questions, you may also contact your provider using MyChart. We now offer e-Visits for anyone 62 and older to request care online for non-urgent symptoms. For details visit [email or web address].   Also download the MyChart app! Go to the app store, search "MyChart", open the app, select [facility], and log in with your MyChart username and password.  Due to Covid, a mask is required upon entering the hospital/clinic. If you do not have a mask, one will be given to you upon arrival. For doctor visits, patients may have 1 support person aged 29 or older with them. For treatment visits, patients cannot have anyone with them due to current Covid guidelines and our immunocompromised population.   Doxorubicin Liposomal injection What is this medication? LIPOSOMAL DOXORUBICIN (LIP oh som al  dox oh ROO bi sin) is a chemotherapy drug. This medicine is used to treat many kinds of cancer like Kaposi's sarcoma, multiple myeloma, and ovarian cancer. This medicine may be used for other purposes; ask your health care  provider or pharmacist if you have questions. COMMON BRAND NAME(S): Doxil, Lipodox What should I tell my care team before I take this medication? They need to know if you have any of these conditions: blood disorders heart disease infection (especially a virus infection such as chickenpox,  cold sores, or herpes) liver disease recent or ongoing radiation therapy an unusual or allergic reaction to doxorubicin, other chemotherapy agents, soybeans, other medicines, foods, dyes, or preservatives pregnant or trying to get pregnant breast-feeding How should I use this medication? This drug is given as an infusion into a vein. It is administered in a hospital or clinic by a specially trained health care professional. If you have pain, swelling, burning or any unusual feeling around the site of your injection, tell your health care professional right away. Talk to your pediatrician regarding the use of this medicine in children. Special care may be needed. Overdosage: If you think you have taken too much of this medicine contact a poison control center or emergency room at once. NOTE: This medicine is only for you. Do not share this medicine with others. What if I miss a dose? It is important not to miss your dose. Call your doctor or health care professional if you are unable to keep an appointment. What may interact with this medication? Do not take this medicine with any of the following medications: zidovudine This medicine may also interact with the following medications: medicines to increase blood counts like filgrastim, pegfilgrastim, sargramostim vaccines Talk to your doctor or health care professional before taking any of these medicines: acetaminophen aspirin ibuprofen ketoprofen naproxen This list may not describe all possible interactions. Give your health care provider a list of all the medicines, herbs, non-prescription drugs, or dietary supplements you use. Also tell them if you smoke, drink alcohol, or use illegal drugs. Some items may interact with your medicine. What should I watch for while using this medication? Your condition will be monitored carefully while you are receiving this medicine. You may need blood work done while you are taking this medicine. This  drug may make you feel generally unwell. This is not uncommon, as chemotherapy can affect healthy cells as well as cancer cells. Report any side effects. Continue your course of treatment even though you feel ill unless your doctor tells you to stop. Your urine may turn orange-red for a few days after your dose. This is not blood. If your urine is dark or brown, call your doctor. In some cases, you may be given additional medicines to help with side effects. Follow all directions for their use. Talk to your doctor about your risk of cancer. You may be more at risk for certain types of cancers if you take this medicine. Do not become pregnant while taking this medicine or for 6 months after stopping it. Women should inform their healthcare professional if they wish to become pregnant or think they may be pregnant. Men should not father a child while taking this medicine and for 6 months after stopping it. There is a potential for serious side effects to an unborn child. Talk to your health care professional or pharmacist for more information. Do not breast-feed an infant while taking this medicine. This medicine has caused ovarian failure in some women. This medicine may make it more difficult to get pregnant. Talk to your healthcare professional if you are concerned about your fertility. This medicine has caused decreased sperm counts in some men. This may make it more difficult to  father a child. Talk to your healthcare professional if you are concerned about your fertility. This medicine may cause a decrease in Co-Enzyme Q-10. You should make sure that you get enough Co-Enzyme Q-10 while you are taking this medicine. Discuss the foods you eat and the vitamins you take with your health care professional. What side effects may I notice from receiving this medication? Side effects that you should report to your doctor or health care professional as soon as possible: allergic reactions like skin rash,  itching or hives, swelling of the face, lips, or tongue low blood counts - this medicine may decrease the number of white blood cells, red blood cells and platelets. You may be at increased risk for infections and bleeding. signs of hand-foot syndrome - tingling or burning, redness, flaking, swelling, small blisters, or small sores on the palms of your hands or the soles of your feet signs of infection - fever or chills, cough, sore throat, pain or difficulty passing urine signs of decreased platelets or bleeding - bruising, pinpoint red spots on the skin, black, tarry stools, blood in the urine signs of decreased red blood cells - unusually weak or tired, fainting spells, lightheadedness back pain, chills, facial flushing, fever, headache, tightness in the chest or throat during the infusion breathing problems chest pain fast, irregular heartbeat mouth pain, redness, sores pain, swelling, redness at site where injected pain, tingling, numbness in the hands or feet swelling of ankles, feet, or hands vomiting Side effects that usually do not require medical attention (report to your doctor or health care professional if they continue or are bothersome): diarrhea hair loss loss of appetite nail discoloration or damage nausea red or watery eyes red colored urine stomach upset This list may not describe all possible side effects. Call your doctor for medical advice about side effects. You may report side effects to FDA at 1-800-FDA-1088. Where should I keep my medication? This drug is given in a hospital or clinic and will not be stored at home. NOTE: This sheet is a summary. It may not cover all possible information. If you have questions about this medicine, talk to your doctor, pharmacist, or health care provider.  2022 Elsevier/Gold Standard (2017-12-05 00:00:00)   Bevacizumab injection What is this medication? BEVACIZUMAB (be va SIZ yoo mab) is a monoclonal antibody. It is used to  treat many types of cancer. This medicine may be used for other purposes; ask your health care provider or pharmacist if you have questions. COMMON BRAND NAME(S): Alymsys, Avastin, MVASI, Noah Charon What should I tell my care team before I take this medication? They need to know if you have any of these conditions: diabetes heart disease high blood pressure history of coughing up blood prior anthracycline chemotherapy (e.g., doxorubicin, daunorubicin, epirubicin) recent or ongoing radiation therapy recent or planning to have surgery stroke an unusual or allergic reaction to bevacizumab, hamster proteins, mouse proteins, other medicines, foods, dyes, or preservatives pregnant or trying to get pregnant breast-feeding How should I use this medication? This medicine is for infusion into a vein. It is given by a health care professional in a hospital or clinic setting. Talk to your pediatrician regarding the use of this medicine in children. Special care may be needed. Overdosage: If you think you have taken too much of this medicine contact a poison control center or emergency room at once. NOTE: This medicine is only for you. Do not share this medicine with others. What if I miss a dose? It  is important not to miss your dose. Call your doctor or health care professional if you are unable to keep an appointment. What may interact with this medication? Interactions are not expected. This list may not describe all possible interactions. Give your health care provider a list of all the medicines, herbs, non-prescription drugs, or dietary supplements you use. Also tell them if you smoke, drink alcohol, or use illegal drugs. Some items may interact with your medicine. What should I watch for while using this medication? Your condition will be monitored carefully while you are receiving this medicine. You will need important blood work and urine testing done while you are taking this medicine. This  medicine may increase your risk to bruise or bleed. Call your doctor or health care professional if you notice any unusual bleeding. Before having surgery, talk to your health care provider to make sure it is ok. This drug can increase the risk of poor healing of your surgical site or wound. You will need to stop this drug for 28 days before surgery. After surgery, wait at least 28 days before restarting this drug. Make sure the surgical site or wound is healed enough before restarting this drug. Talk to your health care provider if questions. Do not become pregnant while taking this medicine or for 6 months after stopping it. Women should inform their doctor if they wish to become pregnant or think they might be pregnant. There is a potential for serious side effects to an unborn child. Talk to your health care professional or pharmacist for more information. Do not breast-feed an infant while taking this medicine and for 6 months after the last dose. This medicine has caused ovarian failure in some women. This medicine may interfere with the ability to have a child. You should talk to your doctor or health care professional if you are concerned about your fertility. What side effects may I notice from receiving this medication? Side effects that you should report to your doctor or health care professional as soon as possible: allergic reactions like skin rash, itching or hives, swelling of the face, lips, or tongue chest pain or chest tightness chills coughing up blood high fever seizures severe constipation signs and symptoms of bleeding such as bloody or black, tarry stools; red or dark-brown urine; spitting up blood or brown material that looks like coffee grounds; red spots on the skin; unusual bruising or bleeding from the eye, gums, or nose signs and symptoms of a blood clot such as breathing problems; chest pain; severe, sudden headache; pain, swelling, warmth in the leg signs and symptoms of  a stroke like changes in vision; confusion; trouble speaking or understanding; severe headaches; sudden numbness or weakness of the face, arm or leg; trouble walking; dizziness; loss of balance or coordination stomach pain sweating swelling of legs or ankles vomiting weight gain Side effects that usually do not require medical attention (report to your doctor or health care professional if they continue or are bothersome): back pain changes in taste decreased appetite dry skin nausea tiredness This list may not describe all possible side effects. Call your doctor for medical advice about side effects. You may report side effects to FDA at 1-800-FDA-1088. Where should I keep my medication? This drug is given in a hospital or clinic and will not be stored at home. NOTE: This sheet is a summary. It may not cover all possible information. If you have questions about this medicine, talk to your doctor, pharmacist, or health  care provider.  2022 Elsevier/Gold Standard (2020-12-14 00:00:00)

## 2021-03-16 ENCOUNTER — Inpatient Hospital Stay: Payer: Medicare Other

## 2021-03-16 ENCOUNTER — Inpatient Hospital Stay: Payer: Medicare Other | Admitting: Oncology

## 2021-03-24 ENCOUNTER — Ambulatory Visit: Payer: Medicare Other

## 2021-03-24 ENCOUNTER — Ambulatory Visit: Payer: Medicare Other | Admitting: Oncology

## 2021-03-24 ENCOUNTER — Other Ambulatory Visit: Payer: Medicare Other

## 2021-03-25 ENCOUNTER — Other Ambulatory Visit: Payer: Self-pay

## 2021-03-25 ENCOUNTER — Inpatient Hospital Stay (HOSPITAL_BASED_OUTPATIENT_CLINIC_OR_DEPARTMENT_OTHER): Payer: Medicare Other | Admitting: Oncology

## 2021-03-25 ENCOUNTER — Inpatient Hospital Stay: Payer: Medicare Other

## 2021-03-25 ENCOUNTER — Encounter: Payer: Self-pay | Admitting: Oncology

## 2021-03-25 VITALS — BP 148/75 | HR 71

## 2021-03-25 VITALS — BP 166/87 | HR 65 | Temp 98.2°F | Wt 119.2 lb

## 2021-03-25 DIAGNOSIS — D696 Thrombocytopenia, unspecified: Secondary | ICD-10-CM | POA: Diagnosis not present

## 2021-03-25 DIAGNOSIS — Z5189 Encounter for other specified aftercare: Secondary | ICD-10-CM | POA: Diagnosis not present

## 2021-03-25 DIAGNOSIS — T451X5A Adverse effect of antineoplastic and immunosuppressive drugs, initial encounter: Secondary | ICD-10-CM | POA: Diagnosis not present

## 2021-03-25 DIAGNOSIS — R591 Generalized enlarged lymph nodes: Secondary | ICD-10-CM | POA: Diagnosis not present

## 2021-03-25 DIAGNOSIS — R971 Elevated cancer antigen 125 [CA 125]: Secondary | ICD-10-CM | POA: Diagnosis not present

## 2021-03-25 DIAGNOSIS — E039 Hypothyroidism, unspecified: Secondary | ICD-10-CM | POA: Diagnosis not present

## 2021-03-25 DIAGNOSIS — E871 Hypo-osmolality and hyponatremia: Secondary | ICD-10-CM | POA: Diagnosis not present

## 2021-03-25 DIAGNOSIS — D6481 Anemia due to antineoplastic chemotherapy: Secondary | ICD-10-CM

## 2021-03-25 DIAGNOSIS — Z5111 Encounter for antineoplastic chemotherapy: Secondary | ICD-10-CM

## 2021-03-25 DIAGNOSIS — C569 Malignant neoplasm of unspecified ovary: Secondary | ICD-10-CM | POA: Diagnosis not present

## 2021-03-25 DIAGNOSIS — R911 Solitary pulmonary nodule: Secondary | ICD-10-CM | POA: Diagnosis not present

## 2021-03-25 DIAGNOSIS — Z5112 Encounter for antineoplastic immunotherapy: Secondary | ICD-10-CM | POA: Diagnosis not present

## 2021-03-25 DIAGNOSIS — C561 Malignant neoplasm of right ovary: Secondary | ICD-10-CM | POA: Diagnosis not present

## 2021-03-25 LAB — COMPREHENSIVE METABOLIC PANEL
ALT: 10 U/L (ref 0–44)
AST: 17 U/L (ref 15–41)
Albumin: 3.9 g/dL (ref 3.5–5.0)
Alkaline Phosphatase: 61 U/L (ref 38–126)
Anion gap: 5 (ref 5–15)
BUN: 16 mg/dL (ref 8–23)
CO2: 24 mmol/L (ref 22–32)
Calcium: 9.4 mg/dL (ref 8.9–10.3)
Chloride: 98 mmol/L (ref 98–111)
Creatinine, Ser: 0.5 mg/dL (ref 0.44–1.00)
GFR, Estimated: >60 mL/min (ref >60)
Glucose, Bld: 90 mg/dL (ref 70–99)
Potassium: 4.2 mmol/L (ref 3.5–5.1)
Sodium: 127 mmol/L — ABNORMAL LOW (ref 135–145)
Total Bilirubin: 0.5 mg/dL (ref 0.3–1.2)
Total Protein: 6.9 g/dL (ref 6.5–8.1)

## 2021-03-25 LAB — CBC WITH DIFFERENTIAL/PLATELET
Abs Immature Granulocytes: 0.13 10*3/uL — ABNORMAL HIGH (ref 0.00–0.07)
Basophils Absolute: 0.1 10*3/uL (ref 0.0–0.1)
Basophils Relative: 1 %
Eosinophils Absolute: 0.1 10*3/uL (ref 0.0–0.5)
Eosinophils Relative: 3 %
HCT: 29.4 % — ABNORMAL LOW (ref 36.0–46.0)
Hemoglobin: 9.9 g/dL — ABNORMAL LOW (ref 12.0–15.0)
Immature Granulocytes: 4 %
Lymphocytes Relative: 23 %
Lymphs Abs: 0.8 10*3/uL (ref 0.7–4.0)
MCH: 34.3 pg — ABNORMAL HIGH (ref 26.0–34.0)
MCHC: 33.7 g/dL (ref 30.0–36.0)
MCV: 101.7 fL — ABNORMAL HIGH (ref 80.0–100.0)
Monocytes Absolute: 0.3 10*3/uL (ref 0.1–1.0)
Monocytes Relative: 8 %
Neutro Abs: 2.1 10*3/uL (ref 1.7–7.7)
Neutrophils Relative %: 61 %
Platelets: 130 10*3/uL — ABNORMAL LOW (ref 150–400)
RBC: 2.89 MIL/uL — ABNORMAL LOW (ref 3.87–5.11)
RDW: 14.6 % (ref 11.5–15.5)
WBC: 3.5 10*3/uL — ABNORMAL LOW (ref 4.0–10.5)
nRBC: 0 % (ref 0.0–0.2)

## 2021-03-25 LAB — PROTEIN, URINE, RANDOM: Total Protein, Urine: 13 mg/dL

## 2021-03-25 MED ORDER — HEPARIN SOD (PORK) LOCK FLUSH 100 UNIT/ML IV SOLN
INTRAVENOUS | Status: AC
  Administered 2021-03-25: 500 [IU]
  Filled 2021-03-25: qty 5

## 2021-03-25 MED ORDER — HEPARIN SOD (PORK) LOCK FLUSH 100 UNIT/ML IV SOLN
500.0000 [IU] | Freq: Once | INTRAVENOUS | Status: AC | PRN
  Filled 2021-03-25: qty 5

## 2021-03-25 MED ORDER — SODIUM CHLORIDE 0.9 % IV SOLN
Freq: Once | INTRAVENOUS | Status: AC
  Filled 2021-03-25: qty 250

## 2021-03-25 MED ORDER — SODIUM CHLORIDE 0.9 % IV SOLN
10.0000 mg/kg | Freq: Once | INTRAVENOUS | Status: AC
  Administered 2021-03-25: 500 mg via INTRAVENOUS
  Filled 2021-03-25: qty 16

## 2021-03-25 NOTE — Progress Notes (Signed)
Hematology/Oncology Progress  note [phone]      Patient Care Team: Patient, No Pcp Per (Inactive) as PCP - General (General Practice) Clent Jacks, [name], [name], [name], MD as Referring Physician (Obstetrics and Gynecology)  CHIEF COMPLAINTS/PURPOSE OF Visit Follow up for chemotherapy for ovarian cancer.  HISTORY OF PRESENTING ILLNESS: Katie Hill 70 y.o. female presents for follow up of management of stage IIIC ovarian cancer. She underwent  Neoadjuvant chemotherapy of carbo and taxol x 4  # Patient had debulking surgery on 03/14/2017. She had a laparoscopy with conversion to laparotomy, total hysterectomy, with bilateral salpingo oophorectomy, right aortic lymph node dissection, omentectomy. Pathology showed small foci of residual disease in ovary.   Genetic testing negative for 83 genes on [facility]'s Multi-Cancer panel (ALK, APC, ATM, AXIN2, BAP1, BARD1, BLM, BMPR1A, BRCA1, BRCA2, BRIP1, CASR, CDC73, CDH1, CDK4, CDKN1B, CDKN1C, CDKN2A, CEBPA, CHEK2, CTNNA1, DICER1, DIS3L2, EGFR, EPCAM, FH, FLCN, GATA2, GPC3, GREM1, HOXB13, HRAS, KIT, MAX, MEN1, MET, MITF, MLH1, MSH2, MSH3, MSH6, MUTYH, NBN, NF1, NF2, NTHL1, PALB2, PDGFRA, PHOX2B, PMS2, POLD1, POLE, POT1, PRKAR1A, PTCH1, PTEN, RAD50, RAD51C, RAD51D, RB1, RECQL4, RET, RUNX1, SDHA, SDHAF2, SDHB, SDHC, SDHD, SMAD4, SMARCA4, SMARCB1, SMARCE1, STK11, SUFU, TERC, TERT, TMEM127, TP53, TSC1, TSC2, VHL, WRN, WT1).   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  Myraid testing negative for somatic BRACA1/2, positive for HRD.   # HRD positive, was referred to [facility] for clinical trials of Olarparib maintenance. She opted out.  #  Treatment:  Stage IIIC Ovarian cancer:  #s/p Carboplatin and taxol x 4 neoadjuvant chemotherapy followed by debulking  surgery 03/14/2017 # 03/28/2017 S/p Adjuvant carbo and taxol x3   Local recurrence, CA125 one 46.3 03/15/2018-05/17/2018 Carboplatin and Taxol x 4. CA125 decrease from 49.7 to 6 after 4 cycles of treatment.  Started on Olarparib 358m BID on 05/17/2018.  # was seen by Dr. BFransisca Connorsfor further evaluation due to rising Ca1 25 and MRI abdomenPelvis on 11/05/2018 showed Interval progression of, now measuring 2.7 x 2.3 cm and concerning for progression of metastatic disease.retrocaval lymph node in the abdomen Olaparib was held for short period of time. Her CA125 trended down again.  Dr. BFransisca Connorsrecommending resume olaparib and continue monitor. If she has more significant progression of disease she may be a candidate for clinical trial or will be treated with other chemotherapy drugs including Doxil, gemcitabine and Avastin.  #10/06/2020, CT chest abdomen pelvis with contrast showed new lymph nodes in the prevascular space of the upper anterior mediastinum most consistent with metastasis.  Single new right lower lobe pulmonary nodule is concerning for early pulmonary metastasis.  Interval enlargement of the left periaortic retroperitoneal lymph node.  Stable adjacent aorto caval adenopathy.  10/29/2020 MUGA LVEF normal 10/29/2020 Stopped Olarparib  11/03/2020 start Doxil + bevacizumab 01/14/21 Echocardiogram -LVEF 55-60%  INTERVAL HISTORY 70y.o. female with above oncology history reviewed by me today presents for evaluation for chemotherapy for treatment of ovarian cancer  Patient was accompanied by her husband.   Patient reports feeling well.  No new complaints. Patient takes Norvasc 2.5 mg daily as needed if systolic blood pressures above 150. .Marland KitchenReview of Systems  Constitutional:  Negative for appetite change, chills, fatigue and fever.  HENT:   Negative for hearing loss and voice change.   Eyes:  Negative for eye problems.  Respiratory:  Negative for  chest tightness and cough.   Cardiovascular:   Negative for chest pain.  Gastrointestinal:  Negative for abdominal distention, abdominal pain and blood in stool.  Endocrine: Negative for hot flashes.  Genitourinary:  Negative for difficulty urinating and frequency.   Musculoskeletal:  Negative for arthralgias.  Skin:  Negative for itching and rash.  Neurological:  Negative for extremity weakness.  Hematological:  Negative for adenopathy.  Psychiatric/Behavioral:  Negative for confusion.     [name] MEDICAL HISTORY: Past Medical History:  Diagnosis Date   Dysrhythmia    Genetic testing 03/28/2017   Multi-Cancer panel (83 genes) @ [facility] - No pathogenic mutations detected   High grade ovarian cancer ([place]) 11/20/2016   Pelvic mass in female     SURGICAL HISTORY: Past Surgical History:  Procedure Laterality Date   APPENDECTOMY     LAPAROSCOPY N/A 03/14/2017   Procedure: LAPAROSCOPY OPERATIVE;  Surgeon: Mellody Drown, MD;  Location: [facility] ORS;  Service: Gynecology;  Laterality: N/A;   LAPAROTOMY N/A 03/14/2017   Procedure: LAPAROTOMY;  Surgeon: Mellody Drown, MD;  Location: [facility] ORS;  Service: Gynecology;  Laterality: N/A;   LYMPH NODE DISSECTION N/A 03/14/2017   Procedure: LYMPH NODE DISSECTION;  Surgeon: Mellody Drown, MD;  Location: [facility] ORS;  Service: Gynecology;  Laterality: N/A;   OMENTECTOMY N/A 03/14/2017   Procedure: OMENTECTOMY;  Surgeon: Mellody Drown, MD;  Location: [facility] ORS;  Service: Gynecology;  Laterality: N/A;   PORTA CATH INSERTION N/A 11/27/2016   Procedure: Glori Luis Cath Insertion;  Surgeon: Algernon Huxley, MD;  Location: [facility];  Service: Cardiovascular;  Laterality: N/A;    SOCIAL HISTORY: Social History   Tobacco Use   Smoking status: Never   Smokeless tobacco: Never  Vaping Use   Vaping Use: Never used  Substance Use Topics   Alcohol use: Not Currently   Drug use: No     FAMILY HISTORY Family History  Problem Relation Age of Onset   Throat cancer Cousin    Throat cancer Cousin     Leukemia Cousin     ALLERGIES:  is allergic to omeprazole.  MEDICATIONS:  Current Outpatient Medications  Medication Sig Dispense Refill   amLODipine (NORVASC) 2.5 MG tablet Take 1 tablet (2.5 mg total) by mouth daily. 30 tablet 0   hydrocortisone cream 0.5 % Apply 1 application topically 3 (three) times daily. 30 g 0   levothyroxine (SYNTHROID) 50 MCG tablet Take 1 tablet (50 mcg total) by mouth daily before breakfast. 30 tablet 0   lidocaine-prilocaine (EMLA) cream Apply 1 application topically as needed. Apply small amount to port site at least 1 hour prior to it being accessed, cover with plastic wrap 30 g 1   ondansetron (ZOFRAN) 8 MG tablet Take 1 tablet (8 mg total) by mouth every 8 (eight) hours as needed for nausea or vomiting. 60 tablet 1   No current facility-administered medications for this visit.    PHYSICAL EXAMINATION:  ECOG PERFORMANCE STATUS: 0 - Asymptomatic Vitals:   03/25/21 1035  BP: (!) 166/87  Pulse: 65  Temp: 98.2 F (36.8 C)    Filed Weights   03/25/21 1035  Weight: 119 lb 3.2 oz (54.1 kg)     Physical Exam Constitutional:      General: She is not in acute distress.    Appearance: She is not diaphoretic.     Comments: Thin built, she walks independantly  HENT:     Head: Normocephalic and atraumatic.     Nose: Nose normal.  Mouth/Throat:     Pharynx: No oropharyngeal exudate.  Eyes:     General: No scleral icterus.    Pupils: Pupils are equal, round, and reactive to light.  Neck:     Vascular: No JVD.  Cardiovascular:     Rate and Rhythm: Normal rate and regular rhythm.     Heart sounds: No murmur heard. Pulmonary:     Effort: Pulmonary effort is normal. No respiratory distress.     Breath sounds: Normal breath sounds. No rales.  Chest:     Chest wall: No tenderness.  Abdominal:     General: Bowel sounds are normal. There is no distension.     Palpations: Abdomen is soft.     Tenderness: There is no abdominal tenderness.   Musculoskeletal:        General: Normal range of motion.     Cervical back: Normal range of motion and neck supple.  Lymphadenopathy:     Cervical: No cervical adenopathy.  Skin:    General: Skin is warm and dry.     Findings: No erythema or rash.     Comments: Right anterior medi port +   Neurological:     Mental Status: She is alert and oriented to person, place, and time.     Cranial Nerves: No cranial nerve deficit.     Motor: No abnormal muscle tone.     Coordination: Coordination normal.  Psychiatric:        Mood and Affect: Affect normal.        Judgment: Judgment normal.       LABORATORY DATA: I have personally reviewed the data as listed: CBC    Component Value Date/Time   WBC 3.5 (L) 03/25/2021 1020   RBC 2.89 (L) 03/25/2021 1020   HGB 9.9 (L) 03/25/2021 1020   HCT 29.4 (L) 03/25/2021 1020   PLT 130 (L) 03/25/2021 1020   MCV 101.7 (H) 03/25/2021 1020   MCH 34.3 (H) 03/25/2021 1020   MCHC 33.7 03/25/2021 1020   RDW 14.6 03/25/2021 1020   LYMPHSABS 0.8 03/25/2021 1020   MONOABS 0.3 03/25/2021 1020   EOSABS 0.1 03/25/2021 1020   BASOSABS 0.1 03/25/2021 1020   CMP Latest Ref Rng & Units 03/25/2021 03/10/2021 02/24/2021  Glucose 70 - 99 mg/dL 90 119(H) 103(H)  BUN 8 - 23 mg/dL '16 12 9  ' Creatinine 0.44 - 1.00 mg/dL 0.50 0.67 0.62  Sodium 135 - 145 mmol/L 127(L) 131(L) 129(L)  Potassium 3.5 - 5.1 mmol/L 4.2 4.5 4.0  Chloride 98 - 111 mmol/L 98 96(L) 100  CO2 22 - 32 mmol/L '24 25 22  ' Calcium 8.9 - 10.3 mg/dL 9.4 10.1 9.1  Total Protein 6.5 - 8.1 g/dL 6.9 7.8 -  Total Bilirubin 0.3 - 1.2 mg/dL 0.5 0.4 -  Alkaline Phos 38 - 126 U/L 61 63 -  AST 15 - 41 U/L 17 20 -  ALT 0 - 44 U/L 10 10 -    Pathology 11/16/2016 Surgical Pathology  CASE: [id number]  PATIENT: Kendel Grills  Surgical Pathology Report   SPECIMEN SUBMITTED:  A. Retroperitoneal adenopathy, left  DIAGNOSIS:  A. LYMPH NODE, LEFT RETROPERITONEAL; CT-GUIDED CORE BIOPSY:  - METASTATIC  HIGH-GRADE SEROUS CARCINOMA.  Pathology 03/14/2017   DIAGNOSIS:  A. OMENTUM; OMENTECTOMY:  - NO TUMOR SEEN.  - ONE NEGATIVE LYMPH NODE (0/1).   B.  RIGHT FALLOPIAN TUBE AND OVARY; SALPINGO-OOPHORECTOMY:  - SMALL FOCI OF HIGH GRADE SEROUS CARCINOMA INVOLVING THE OVARY.  - MARKED THERAPY RELATED  CHANGE.  - NO TUMOR SEEN IN THE FALLOPIAN TUBE.   C.  UTERUS, CERVIX, LEFT FALLOPIAN TUBE AND OVARY; HYSTERECTOMY AND LEFT  SALPINGO-OOPHORECTOMY:  - NABOTHIAN CYSTS.  - CYSTIC ATROPHY OF THE ENDOMETRIUM.  - SEROSAL ADHESIONS.  - UNREMARKABLE FALLOPIAN TUBE.  - OVARY SHOWING TREATMENT RELATED CHANGE.   D.  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    RADIOGRAPHIC STUDIES: I have personally reviewed the radiological images as listed and agreed with the findings in the report. CT CHEST ABDOMEN PELVIS W CONTRAST  Result Date: 01/27/2021 CLINICAL DATA:  Ovarian cancer restaging, recurrent EXAM: CT CHEST, ABDOMEN, AND PELVIS WITH CONTRAST TECHNIQUE: Multidetector CT imaging of the chest, abdomen and pelvis was performed following the standard protocol during bolus administration of intravenous contrast. CONTRAST:  69m OMNIPAQUE IOHEXOL 300 MG/ML SOLN, additional oral enteric contrast COMPARISON:  10/06/2020 FINDINGS: CT CHEST FINDINGS Cardiovascular: Right chest port catheter. Normal heart size. No pericardial effusion. Mediastinum/Nodes: Unchanged enlarged left superior mediastinal and prevascular nodes, measuring up to 1.6 x 1.3 cm (series 2, image 15). Thyroid gland, trachea, and esophagus demonstrate no significant findings. Lungs/Pleura: Interval decrease in size of a nodule of the peripheral right lower lobe, now measuring no greater than 2 mm, previously 4 mm (series 3, image 94). Unchanged 6 mm nodule of the dependent left lower lobe (series 3, image 98). Minimal scarring of the dependent bilateral lung bases. No  pleural effusion or pneumothorax. Musculoskeletal: No chest wall mass or suspicious bone lesions identified. CT ABDOMEN PELVIS FINDINGS Hepatobiliary: No solid liver abnormality is seen. No gallstones, gallbladder wall thickening, or biliary dilatation. Pancreas: Unremarkable. No pancreatic ductal dilatation or surrounding inflammatory changes. Spleen: Normal in size without significant abnormality. Adrenals/Urinary Tract: Adrenal glands are unremarkable. Kidneys are normal, without renal calculi, solid lesion, or hydronephrosis. Bladder is unremarkable. Stomach/Bowel: Stomach is within normal limits. Appendix is not clearly visualized and may be surgically absent. Status post omentectomy. No evidence of bowel wall thickening, distention, or inflammatory changes. Vascular/Lymphatic: Scattered aortic atherosclerosis. Unchanged enlarged left retroperitoneal lymph nodes, measuring up to 2.1 x 1.5 cm, previously 2.2 x 1.6 cm (series 2, image 69). Reproductive: Status post hysterectomy and oophorectomy. Unchanged soft tissue nodule in the posterior pelvis measuring 2.8 x 1.6 cm (series 2, image 113). Other: No abdominal wall hernia or abnormality. Musculoskeletal: No acute or significant osseous findings. IMPRESSION: 1. Interval decrease in size of a nodule of the peripheral right lower lobe, now measuring no greater than 2 mm, previously 4 mm. This may reflect treatment response of a solitary pulmonary metastasis, although resolution of nonspecific infection inflammation is a differential consideration. An additional 6 mm nodule of the dependent left lower lobe remains unchanged. Attention on follow-up. 2. Unchanged enlarged left superior mediastinal and prevascular lymph nodes. 3. Unchanged enlarged left retroperitoneal lymph nodes. 4. Findings are consistent with stable nodal metastatic disease. 5. Unchanged soft tissue nodule in the posterior pelvis. 6. Status post hysterectomy, oophorectomy, and omentectomy. Aortic  Atherosclerosis (ICD10-I70.0). Electronically Signed   By: ADelanna AhmadiM.D.   On: 01/27/2021 08:45   ECHOCARDIOGRAM COMPLETE  Result Date: 01/14/2021    ECHOCARDIOGRAM REPORT   Patient Name:   Katie ROTONDODate of Exam: 01/14/2021 Medical Rec #:  [id number]       Height:       68.5 in Accession #:    [id number]      Weight:  119.0 lb Date of Birth:  03-16-1951        BSA:          1.648 m Patient Age:    61 years         BP:           125/72 mmHg Patient Gender: F                HR:           55 bpm. Exam Location:  [facility] Procedure: 2D Echo, Cardiac Doppler, Color Doppler and Strain Analysis Indications:     Chemo Z09  History:         Patient has no prior history of Echocardiogram examinations.                  Dysrhythmia, High grade ovarian cancer.  Sonographer:     Sherrie Sport Referring Phys:  [id number] [place] Diagnosing Phys: Kathlyn Sacramento MD  Sonographer Comments: Suboptimal apical window. Global longitudinal strain was attempted. IMPRESSIONS  1. Left ventricular ejection fraction, by estimation, is 55 to 60%. The left ventricle has normal function. The left ventricle has no regional wall motion abnormalities. Left ventricular diastolic parameters were normal.  2. Right ventricular systolic function is normal. The right ventricular size is normal. There is normal pulmonary artery systolic pressure.  3. The mitral valve is normal in structure. No evidence of mitral valve regurgitation. No evidence of mitral stenosis.  4. The aortic valve is normal in structure. Aortic valve regurgitation is not visualized. No aortic stenosis is present.  5. The inferior vena cava is normal in size with greater than 50% respiratory variability, suggesting right atrial pressure of 3 mmHg. FINDINGS  Left Ventricle: Left ventricular ejection fraction, by estimation, is 55 to 60%. The left ventricle has normal function. The left ventricle has no regional wall motion abnormalities. Global longitudinal strain performed  but not reported based on interpreter judgement due to suboptimal tracking. The left ventricular internal cavity size was normal in size. There is no left ventricular hypertrophy. Left ventricular diastolic parameters were normal. Right Ventricle: The right ventricular size is normal. No increase in right ventricular wall thickness. Right ventricular systolic function is normal. There is normal pulmonary artery systolic pressure. The tricuspid regurgitant velocity is 2.52 m/s, and  with an assumed right atrial pressure of 3 mmHg, the estimated right ventricular systolic pressure is 45.4 mmHg. Left Atrium: Left atrial size was normal in size. Right Atrium: Right atrial size was normal in size. Pericardium: There is no evidence of pericardial effusion. Mitral Valve: The mitral valve is normal in structure. No evidence of mitral valve regurgitation. No evidence of mitral valve stenosis. Tricuspid Valve: The tricuspid valve is normal in structure. Tricuspid valve regurgitation is trivial. No evidence of tricuspid stenosis. Aortic Valve: The aortic valve is normal in structure. Aortic valve regurgitation is not visualized. No aortic stenosis is present. Aortic valve mean gradient measures 3.0 mmHg. Aortic valve peak gradient measures 4.2 mmHg. Aortic valve area, by VTI measures 2.47 cm. Pulmonic Valve: The pulmonic valve was normal in structure. Pulmonic valve regurgitation is not visualized. No evidence of pulmonic stenosis. Aorta: The aortic root is normal in size and structure. Venous: The inferior vena cava is normal in size with greater than 50% respiratory variability, suggesting right atrial pressure of 3 mmHg. IAS/Shunts: No atrial level shunt detected by color flow Doppler.  LEFT VENTRICLE PLAX 2D LVIDd:  4.24 cm   Diastology LVIDs:         2.83 cm   LV e' medial:    6.96 cm/s LV PW:         0.92 cm   LV E/e' medial:  9.9 LV IVS:        0.88 cm   LV e' lateral:   7.18 cm/s LVOT diam:     2.00 cm   LV  E/e' lateral: 9.6 LV SV:         48 LV SV Index:   29 LVOT Area:     3.14 cm                           3D Volume EF:                          3D EF:        54 %                          LV EDV:       493 ml                          LV ESV:       227 ml                          LV SV:        266 ml LEFT ATRIUM           Index        RIGHT ATRIUM           Index LA diam:      2.90 cm 1.76 cm/m   RA Area:     11.40 cm LA Vol (A2C): 42.2 ml 25.61 ml/m  RA Volume:   27.40 ml  16.63 ml/m LA Vol (A4C): 18.0 ml 10.92 ml/m  AORTIC VALVE                    PULMONIC VALVE AV Area (Vmax):    2.35 cm     PV Vmax:        0.70 m/s AV Area (Vmean):   2.14 cm     PV Peak grad:   1.9 mmHg AV Area (VTI):     2.47 cm     RVOT Peak grad: 2 mmHg AV Vmax:           103.00 cm/s AV Vmean:          74.400 cm/s AV VTI:            0.196 m AV Peak Grad:      4.2 mmHg AV Mean Grad:      3.0 mmHg LVOT Vmax:         77.00 cm/s LVOT Vmean:        50.700 cm/s LVOT VTI:          0.154 m LVOT/AV VTI ratio: 0.79  AORTA Ao Root diam: 3.10 cm MITRAL VALVE               TRICUSPID VALVE MV Area (PHT): 4.01 cm    TR Peak grad:   25.4 mmHg MV Decel Time: 189 msec    TR Vmax:        252.00 cm/s MV E  velocity: 69.00 cm/s MV A velocity: 62.60 cm/s  SHUNTS MV E/A ratio:  1.10        Systemic VTI:  0.15 m                            Systemic Diam: 2.00 cm Kathlyn Sacramento MD Electronically signed by Kathlyn Sacramento MD Signature Date/Time: 01/14/2021/1:08:59 PM    Final      ASSESSMENT/PLAN  Cancer Staging  Malignant neoplasm of ovary [facility]) Staging form: Ovary, Fallopian Tube, and Primary Peritoneal Carcinoma, AJCC 8th Edition - Clinical stage from 11/22/2016: Stage IIIC (cT3c, cN1b, cM0) - Signed by Earlie Server, MD on 11/22/2016 Stage used in treatment planning: Yes National guidelines used in treatment planning: Yes Type of national guideline used in treatment planning: NCCN  1. High grade ovarian cancer ([place])   2. Hyponatremia   3. Encounter for  antineoplastic chemotherapy   4. Anemia associated with chemotherapy   5. Thrombocytopenia ([place])   6. Acquired hypothyroidism   : # Ovarian Cancer, local recurrence.  Currently on olaparib 300 mg twice daily. She tolerates well.  CA 125 18.7-->61.2-->111-->90.5-->69--> 54.8--> 29.1-->47-->71.2-->67.7->73.2-->82.8-->70.4-->66.4--> 58.4--> 64.9--> 56.9--> 42.2-->47.7-->54.4-->68.2-> 97-->168-->70.3--> 57.4--> 49.4->33.7 01/26/2021, CT chest abdomen pelvis-partial response-decrease of lung nodule.  Unchanged lymphadenopathy. Labs reviewed and discussed with patient Proceed with bevacizumab.  #Hypertension, likely secondary to bevacizumab.   Blood pressure in the clinic is progressively increasing.  I recommend patient to try Norvasc 2.5 mg daily scheduled.  Monitor blood pressure at home.    Chronic anemia, chemotherapy-induced.  Monitor. Chronic macrocytosis with adequate B12 and folate  #hypothyroidism, continue Synthroid 50 MCG daily. Hyponatremia, likely secondary to hypothyroidism.  Repeat TSH and free T4 at the next visit.   Port-A-Cath in place Patient declined influenza vaccination.  RTF 2 weeks for lab MD Doxil bevacizumab.    Earlie Server, MD, PhD 03/25/21

## 2021-03-25 NOTE — Patient Instructions (Signed)
[facility] CANCER CTR AT [place]  Discharge Instructions: Thank you for choosing [facility] to provide your oncology and hematology care.  If you have a lab appointment with the [facility], please go directly to the [facility] and check in at the registration area.  Wear comfortable clothing and clothing appropriate for easy access to any Portacath or PICC line.   We strive to give you quality time with your provider. You may need to reschedule your appointment if you arrive late (15 or more minutes).  Arriving late affects you and other patients whose appointments are after yours.  Also, if you miss three or more appointments without notifying the office, you may be dismissed from the clinic at the providers discretion.      For prescription refill requests, have your pharmacy contact our office and allow 72 hours for refills to be completed.    Today you received the following chemotherapy and/or immunotherapy agents Avastin   To help prevent nausea and vomiting after your treatment, we encourage you to take your nausea medication as directed.  BELOW ARE SYMPTOMS THAT SHOULD BE REPORTED IMMEDIATELY: *FEVER GREATER THAN 100.4 F (38 C) OR HIGHER *CHILLS OR SWEATING *NAUSEA AND VOMITING THAT IS NOT CONTROLLED WITH YOUR NAUSEA MEDICATION *UNUSUAL SHORTNESS OF BREATH *UNUSUAL BRUISING OR BLEEDING *URINARY PROBLEMS (pain or burning when urinating, or frequent urination) *BOWEL PROBLEMS (unusual diarrhea, constipation, pain near the anus) TENDERNESS IN MOUTH AND THROAT WITH OR WITHOUT PRESENCE OF ULCERS (sore throat, sores in mouth, or a toothache) UNUSUAL RASH, SWELLING OR PAIN  UNUSUAL VAGINAL DISCHARGE OR ITCHING   Items with * indicate a potential emergency and should be followed up as soon as possible or go to the Emergency Department if any problems should occur.  Please show the CHEMOTHERAPY ALERT CARD or IMMUNOTHERAPY ALERT CARD at check-in to the  Emergency Department and triage nurse.  Should you have questions after your visit or need to cancel or reschedule your appointment, please contact [facility] CANCER [place] AT [place]  [phone] and follow the prompts.  Office hours are 8:00 a.m. to 4:30 p.m. Monday - Friday. Please note that voicemails left after 4:00 p.m. may not be returned until the following business day.  We are closed weekends and major holidays. You have access to a nurse at all times for urgent questions. Please call the main number to the clinic [phone] and follow the prompts.  For any non-urgent questions, you may also contact your provider using MyChart. We now offer e-Visits for anyone 37 and older to request care online for non-urgent symptoms. For details visit [email or web address].   Also download the MyChart app! Go to the app store, search "MyChart", open the app, select [place], and log in with your MyChart username and password.  Due to Covid, a mask is required upon entering the hospital/clinic. If you do not have a mask, one will be given to you upon arrival. For doctor visits, patients may have 1 support person aged 60 or older with them. For treatment visits, patients cannot have anyone with them due to current Covid guidelines and our immunocompromised population.

## 2021-03-26 LAB — CA 125: Cancer Antigen (CA) 125: 32.1 U/mL (ref 0.0–38.1)

## 2021-04-08 ENCOUNTER — Inpatient Hospital Stay: Payer: Medicare Other

## 2021-04-08 ENCOUNTER — Encounter: Payer: Self-pay | Admitting: Oncology

## 2021-04-08 ENCOUNTER — Inpatient Hospital Stay (HOSPITAL_BASED_OUTPATIENT_CLINIC_OR_DEPARTMENT_OTHER): Payer: Medicare Other | Admitting: Oncology

## 2021-04-08 ENCOUNTER — Other Ambulatory Visit: Payer: Self-pay

## 2021-04-08 VITALS — BP 141/81 | HR 66 | Resp 16

## 2021-04-08 VITALS — BP 155/85 | HR 72 | Temp 96.6°F | Resp 18 | Wt 118.8 lb

## 2021-04-08 DIAGNOSIS — E039 Hypothyroidism, unspecified: Secondary | ICD-10-CM

## 2021-04-08 DIAGNOSIS — C569 Malignant neoplasm of unspecified ovary: Secondary | ICD-10-CM

## 2021-04-08 DIAGNOSIS — R591 Generalized enlarged lymph nodes: Secondary | ICD-10-CM | POA: Diagnosis not present

## 2021-04-08 DIAGNOSIS — R911 Solitary pulmonary nodule: Secondary | ICD-10-CM | POA: Diagnosis not present

## 2021-04-08 DIAGNOSIS — C561 Malignant neoplasm of right ovary: Secondary | ICD-10-CM | POA: Diagnosis not present

## 2021-04-08 DIAGNOSIS — E871 Hypo-osmolality and hyponatremia: Secondary | ICD-10-CM

## 2021-04-08 DIAGNOSIS — T451X5A Adverse effect of antineoplastic and immunosuppressive drugs, initial encounter: Secondary | ICD-10-CM

## 2021-04-08 DIAGNOSIS — D6481 Anemia due to antineoplastic chemotherapy: Secondary | ICD-10-CM | POA: Diagnosis not present

## 2021-04-08 DIAGNOSIS — Z5189 Encounter for other specified aftercare: Secondary | ICD-10-CM | POA: Diagnosis not present

## 2021-04-08 DIAGNOSIS — D701 Agranulocytosis secondary to cancer chemotherapy: Secondary | ICD-10-CM | POA: Diagnosis not present

## 2021-04-08 DIAGNOSIS — R971 Elevated cancer antigen 125 [CA 125]: Secondary | ICD-10-CM | POA: Diagnosis not present

## 2021-04-08 DIAGNOSIS — Z5111 Encounter for antineoplastic chemotherapy: Secondary | ICD-10-CM

## 2021-04-08 DIAGNOSIS — Z5112 Encounter for antineoplastic immunotherapy: Secondary | ICD-10-CM | POA: Diagnosis not present

## 2021-04-08 LAB — CBC WITH DIFFERENTIAL/PLATELET
Abs Immature Granulocytes: 0.01 10*3/uL (ref 0.00–0.07)
Basophils Absolute: 0 10*3/uL (ref 0.0–0.1)
Basophils Relative: 1 %
Eosinophils Absolute: 0.1 10*3/uL (ref 0.0–0.5)
Eosinophils Relative: 2 %
HCT: 31.1 % — ABNORMAL LOW (ref 36.0–46.0)
Hemoglobin: 10.3 g/dL — ABNORMAL LOW (ref 12.0–15.0)
Immature Granulocytes: 0 %
Lymphocytes Relative: 24 %
Lymphs Abs: 0.8 10*3/uL (ref 0.7–4.0)
MCH: 34.1 pg — ABNORMAL HIGH (ref 26.0–34.0)
MCHC: 33.1 g/dL (ref 30.0–36.0)
MCV: 103 fL — ABNORMAL HIGH (ref 80.0–100.0)
Monocytes Absolute: 0.5 10*3/uL (ref 0.1–1.0)
Monocytes Relative: 16 %
Neutro Abs: 1.9 10*3/uL (ref 1.7–7.7)
Neutrophils Relative %: 57 %
Platelets: 146 10*3/uL — ABNORMAL LOW (ref 150–400)
RBC: 3.02 MIL/uL — ABNORMAL LOW (ref 3.87–5.11)
RDW: 15.4 % (ref 11.5–15.5)
WBC: 3.3 10*3/uL — ABNORMAL LOW (ref 4.0–10.5)
nRBC: 0 % (ref 0.0–0.2)

## 2021-04-08 LAB — COMPREHENSIVE METABOLIC PANEL
ALT: 10 U/L (ref 0–44)
AST: 19 U/L (ref 15–41)
Albumin: 4 g/dL (ref 3.5–5.0)
Alkaline Phosphatase: 51 U/L (ref 38–126)
Anion gap: 7 (ref 5–15)
BUN: 12 mg/dL (ref 8–23)
CO2: 23 mmol/L (ref 22–32)
Calcium: 9.4 mg/dL (ref 8.9–10.3)
Chloride: 99 mmol/L (ref 98–111)
Creatinine, Ser: 0.55 mg/dL (ref 0.44–1.00)
GFR, Estimated: >60 mL/min (ref >60)
Glucose, Bld: 110 mg/dL — ABNORMAL HIGH (ref 70–99)
Potassium: 4 mmol/L (ref 3.5–5.1)
Sodium: 129 mmol/L — ABNORMAL LOW (ref 135–145)
Total Bilirubin: 0.5 mg/dL (ref 0.3–1.2)
Total Protein: 7 g/dL (ref 6.5–8.1)

## 2021-04-08 LAB — T4, FREE: Free T4: 0.81 ng/dL (ref 0.61–1.12)

## 2021-04-08 LAB — PROTEIN, URINE, RANDOM: Total Protein, Urine: 27 mg/dL

## 2021-04-08 LAB — TSH: TSH: 11.984 u[IU]/mL — ABNORMAL HIGH (ref 0.350–4.500)

## 2021-04-08 MED ORDER — AMLODIPINE BESYLATE 2.5 MG PO TABS: 2.5000 mg | ORAL_TABLET | Freq: Every day | ORAL | 4 refills | Status: DC

## 2021-04-08 MED ORDER — SODIUM CHLORIDE 0.9 % IV SOLN
10.0000 mg/kg | Freq: Once | INTRAVENOUS | Status: AC
  Administered 2021-04-08: 10:00:00 500 mg via INTRAVENOUS
  Filled 2021-04-08: qty 16

## 2021-04-08 MED ORDER — SODIUM CHLORIDE 0.9 % IV SOLN: 5.0000 mg | Freq: Once | INTRAVENOUS | Status: DC

## 2021-04-08 MED ORDER — SODIUM CHLORIDE 0.9% FLUSH
10.0000 mL | INTRAVENOUS | Status: DC | PRN
  Filled 2021-04-08: qty 10

## 2021-04-08 MED ORDER — DEXAMETHASONE SODIUM PHOSPHATE 10 MG/ML IJ SOLN
5.0000 mg | Freq: Once | INTRAMUSCULAR | Status: AC
  Administered 2021-04-08: 10:00:00 5 mg via INTRAVENOUS
  Filled 2021-04-08: qty 1

## 2021-04-08 MED ORDER — PEGFILGRASTIM 6 MG/0.6ML ~~LOC~~ PSKT
6.0000 mg | PREFILLED_SYRINGE | Freq: Once | SUBCUTANEOUS | Status: AC
  Administered 2021-04-08: 12:00:00 6 mg via SUBCUTANEOUS

## 2021-04-08 MED ORDER — SODIUM CHLORIDE 0.9 % IV SOLN
Freq: Once | INTRAVENOUS | Status: AC
  Filled 2021-04-08: qty 250

## 2021-04-08 MED ORDER — HEPARIN SOD (PORK) LOCK FLUSH 100 UNIT/ML IV SOLN
500.0000 [IU] | Freq: Once | INTRAVENOUS | Status: AC | PRN
  Administered 2021-04-08: 12:00:00 500 [IU]
  Filled 2021-04-08: qty 5

## 2021-04-08 MED ORDER — DOXORUBICIN HCL LIPOSOMAL CHEMO INJECTION 2 MG/ML
40.0000 mg/m2 | Freq: Once | INTRAVENOUS | Status: AC
  Administered 2021-04-08: 11:00:00 64 mg via INTRAVENOUS
  Filled 2021-04-08: qty 25

## 2021-04-08 MED ORDER — SODIUM CHLORIDE 0.9% FLUSH
10.0000 mL | Freq: Once | INTRAVENOUS | Status: AC
  Administered 2021-04-08: 09:00:00 10 mL via INTRAVENOUS
  Filled 2021-04-08: qty 10

## 2021-04-08 MED ORDER — DEXTROSE 5 % IV SOLN
Freq: Once | INTRAVENOUS | Status: AC
  Filled 2021-04-08: qty 250

## 2021-04-08 NOTE — Progress Notes (Signed)
Patient here for follow up. No new concerns voiced.  °

## 2021-04-08 NOTE — Patient Instructions (Signed)
[facility] [place]  Discharge Instructions: Thank you for choosing [facility] to provide your oncology and hematology care.  If you have a lab appointment with the [facility], please go directly to the [facility] and check in at the registration area.  Wear comfortable clothing and clothing appropriate for easy access to any Portacath or PICC line.   We strive to give you quality time with your provider. You may need to reschedule your appointment if you arrive late (15 or more minutes).  Arriving late affects you and other patients whose appointments are after yours.  Also, if you miss three or more appointments without notifying the office, you may be dismissed from the clinic at the providers discretion.      For prescription refill requests, have your pharmacy contact our office and allow 72 hours for refills to be completed.    Today you received the following chemotherapy and/or immunotherapy agents Zirabev & Doxil      To help prevent nausea and vomiting after your treatment, we encourage you to take your nausea medication as directed.  BELOW ARE SYMPTOMS THAT SHOULD BE REPORTED IMMEDIATELY: *FEVER GREATER THAN 100.4 F (38 C) OR HIGHER *CHILLS OR SWEATING *NAUSEA AND VOMITING THAT IS NOT CONTROLLED WITH YOUR NAUSEA MEDICATION *UNUSUAL SHORTNESS OF BREATH *UNUSUAL BRUISING OR BLEEDING *URINARY PROBLEMS (pain or burning when urinating, or frequent urination) *BOWEL PROBLEMS (unusual diarrhea, constipation, pain near the anus) TENDERNESS IN MOUTH AND THROAT WITH OR WITHOUT PRESENCE OF ULCERS (sore throat, sores in mouth, or a toothache) UNUSUAL RASH, SWELLING OR PAIN  UNUSUAL VAGINAL DISCHARGE OR ITCHING   Items with * indicate a potential emergency and should be followed up as soon as possible or go to the Emergency Department if any problems should occur.  Please show the CHEMOTHERAPY ALERT CARD or IMMUNOTHERAPY ALERT CARD at  check-in to the Emergency Department and triage nurse.  Should you have questions after your visit or need to cancel or reschedule your appointment, please contact [facility] [place] [facility] [place]  [phone] and follow the prompts.  Office hours are 8:00 a.m. to 4:30 p.m. Monday - Friday. Please note that voicemails left after 4:00 p.m. may not be returned until the following business day.  We are closed weekends and major holidays. You have access to a nurse at all times for urgent questions. Please call the main number to the clinic [phone] and follow the prompts.  For any non-urgent questions, you may also contact your provider using MyChart. We now offer e-Visits for anyone 25 and older to request care online for non-urgent symptoms. For details visit [email or web address].   Also download the MyChart app! Go to the app store, search "MyChart", open the app, select [facility], and log in with your MyChart username and password.  Due to Covid, a mask is required upon entering the hospital/clinic. If you do not have a mask, one will be given to you upon arrival. For doctor visits, patients may have 1 support person aged 39 or older with them. For treatment visits, patients cannot have anyone with them due to current Covid guidelines and our immunocompromised population.

## 2021-04-09 ENCOUNTER — Encounter: Payer: Self-pay | Admitting: Oncology

## 2021-04-09 MED ORDER — LEVOTHYROXINE SODIUM 50 MCG PO TABS: 50.0000 ug | ORAL_TABLET | Freq: Every day | ORAL | 3 refills | Status: DC

## 2021-04-09 NOTE — Addendum Note (Signed)
Addended by: Earlie Server on: 04/09/2021 12:21 AM   Modules accepted: Orders

## 2021-04-09 NOTE — Progress Notes (Signed)
Hematology/Oncology Progress  note [phone]      Patient Care Team: Cletis Athens, MD as PCP - General (Internal Medicine) Clent Jacks, [name], [name], [name], MD as Referring Physician (Obstetrics and Gynecology)  CHIEF COMPLAINTS/PURPOSE OF Visit Follow up for chemotherapy for ovarian cancer.  HISTORY OF PRESENTING ILLNESS: Katie Hill 70 y.o. female presents for follow up of management of stage IIIC ovarian cancer. She underwent  Neoadjuvant chemotherapy of carbo and taxol x 4  # Patient had debulking surgery on 03/14/2017. She had a laparoscopy with conversion to laparotomy, total hysterectomy, with bilateral salpingo oophorectomy, right aortic lymph node dissection, omentectomy. Pathology showed small foci of residual disease in ovary.   Genetic testing negative for 83 genes on [facility]'s Multi-Cancer panel (ALK, APC, ATM, AXIN2, BAP1, BARD1, BLM, BMPR1A, BRCA1, BRCA2, BRIP1, CASR, CDC73, CDH1, CDK4, CDKN1B, CDKN1C, CDKN2A, CEBPA, CHEK2, CTNNA1, DICER1, DIS3L2, EGFR, EPCAM, FH, FLCN, GATA2, GPC3, GREM1, HOXB13, HRAS, KIT, MAX, MEN1, MET, MITF, MLH1, MSH2, MSH3, MSH6, MUTYH, NBN, NF1, NF2, NTHL1, PALB2, PDGFRA, PHOX2B, PMS2, POLD1, POLE, POT1, PRKAR1A, PTCH1, PTEN, RAD50, RAD51C, RAD51D, RB1, RECQL4, RET, RUNX1, SDHA, SDHAF2, SDHB, SDHC, SDHD, SMAD4, SMARCA4, SMARCB1, SMARCE1, STK11, SUFU, TERC, TERT, TMEM127, TP53, TSC1, TSC2, VHL, WRN, WT1).   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  Myraid testing negative for somatic BRACA1/2, positive for HRD.   # HRD positive, was referred to [facility] for clinical trials of Olarparib maintenance. She opted out.  #  Treatment:  Stage IIIC Ovarian cancer:  #s/p Carboplatin and taxol x 4 neoadjuvant chemotherapy followed by debulking surgery  03/14/2017 # 03/28/2017 S/p Adjuvant carbo and taxol x3   Local recurrence, CA125 one 46.3 03/15/2018-05/17/2018 Carboplatin and Taxol x 4. CA125 decrease from 49.7 to 6 after 4 cycles of treatment.  Started on Olarparib $RemoveBefo'300mg'tfYyhKlDtSb$  BID on 05/17/2018.  # was seen by Dr. Fransisca Connors for further evaluation due to rising Ca1 25 and MRI abdomenPelvis on 11/05/2018 showed Interval progression of, now measuring 2.7 x 2.3 cm and concerning for progression of metastatic disease.retrocaval lymph node in the abdomen Olaparib was held for short period of time. Her CA125 trended down again.  Dr. Fransisca Connors recommending resume olaparib and continue monitor. If she has more significant progression of disease she may be a candidate for clinical trial or will be treated with other chemotherapy drugs including Doxil, gemcitabine and Avastin.  #10/06/2020, CT chest abdomen pelvis with contrast showed new lymph nodes in the prevascular space of the upper anterior mediastinum most consistent with metastasis.  Single new right lower lobe pulmonary nodule is concerning for early pulmonary metastasis.  Interval enlargement of the left periaortic retroperitoneal lymph node.  Stable adjacent aorto caval adenopathy.  10/29/2020 MUGA LVEF normal 10/29/2020 Stopped Olarparib  11/03/2020 start Doxil + bevacizumab 01/14/21 Echocardiogram -LVEF 55-60%  INTERVAL HISTORY 70 y.o. female with above oncology history reviewed by me today presents for evaluation for chemotherapy for treatment of ovarian cancer  Patient was accompanied by her husband.   Patient tolerates treatments well.  No new complaints.  Denies any nausea vomiting diarrhea. She monitors blood pressure at home and her blood pressure has been within normal limits.  She takes Norvasc 2.5 mg daily.  Tolerates well . Review of Systems  Constitutional:  Negative for appetite change, chills, fatigue and fever.  HENT:   Negative for hearing loss and  voice change.   Eyes:  Negative for  eye problems.  Respiratory:  Negative for chest tightness and cough.   Cardiovascular:  Negative for chest pain.  Gastrointestinal:  Negative for abdominal distention, abdominal pain and blood in stool.  Endocrine: Negative for hot flashes.  Genitourinary:  Negative for difficulty urinating and frequency.   Musculoskeletal:  Negative for arthralgias.  Skin:  Negative for itching and rash.  Neurological:  Negative for extremity weakness.  Hematological:  Negative for adenopathy.  Psychiatric/Behavioral:  Negative for confusion.     [name] MEDICAL HISTORY: Past Medical History:  Diagnosis Date   Dysrhythmia    Genetic testing 03/28/2017   Multi-Cancer panel (83 genes) @ [facility] - No pathogenic mutations detected   High grade ovarian cancer ([place]) 11/20/2016   Pelvic mass in female     SURGICAL HISTORY: Past Surgical History:  Procedure Laterality Date   APPENDECTOMY     LAPAROSCOPY N/A 03/14/2017   Procedure: LAPAROSCOPY OPERATIVE;  Surgeon: Mellody Drown, MD;  Location: [facility];  Service: Gynecology;  Laterality: N/A;   LAPAROTOMY N/A 03/14/2017   Procedure: LAPAROTOMY;  Surgeon: Mellody Drown, MD;  Location: [facility];  Service: Gynecology;  Laterality: N/A;   LYMPH NODE DISSECTION N/A 03/14/2017   Procedure: LYMPH NODE DISSECTION;  Surgeon: Mellody Drown, MD;  Location: [facility];  Service: Gynecology;  Laterality: N/A;   OMENTECTOMY N/A 03/14/2017   Procedure: OMENTECTOMY;  Surgeon: Mellody Drown, MD;  Location: [facility];  Service: Gynecology;  Laterality: N/A;   PORTA CATH INSERTION N/A 11/27/2016   Procedure: Glori Luis Cath Insertion;  Surgeon: Algernon Huxley, MD;  Location: [facility];  Service: Cardiovascular;  Laterality: N/A;    SOCIAL HISTORY: Social History   Tobacco Use   Smoking status: Never   Smokeless tobacco: Never  Vaping Use   Vaping Use: Never used  Substance Use Topics   Alcohol use: Not Currently   Drug use: No     FAMILY HISTORY Family  History  Problem Relation Age of Onset   Throat cancer Cousin    Throat cancer Cousin    Leukemia Cousin     ALLERGIES:  is allergic to omeprazole.  MEDICATIONS:  Current Outpatient Medications  Medication Sig Dispense Refill   hydrocortisone cream 0.5 % Apply 1 application topically 3 (three) times daily. 30 g 0   levothyroxine (SYNTHROID) 50 MCG tablet Take 1 tablet (50 mcg total) by mouth daily before breakfast. 30 tablet 0   lidocaine-prilocaine (EMLA) cream Apply 1 application topically as needed. Apply small amount to port site at least 1 hour prior to it being accessed, cover with plastic wrap 30 g 1   ondansetron (ZOFRAN) 8 MG tablet Take 1 tablet (8 mg total) by mouth every 8 (eight) hours as needed for nausea or vomiting. 60 tablet 1   amLODipine (NORVASC) 2.5 MG tablet Take 1 tablet (2.5 mg total) by mouth daily. 30 tablet 4   No current facility-administered medications for this visit.    PHYSICAL EXAMINATION:  ECOG PERFORMANCE STATUS: 0 - Asymptomatic Vitals:   04/08/21 0850  BP: (!) 155/85  Pulse: 72  Resp: 18  Temp: (!) 96.6 F (35.9 C)    Filed Weights   04/08/21 0850  Weight: 118 lb 12.8 oz (53.9 kg)     Physical Exam Constitutional:      General: She is not in acute distress.    Appearance: She is not diaphoretic.     Comments: Thin built, she walks  independantly  HENT:     Head: Normocephalic and atraumatic.     Nose: Nose normal.     Mouth/Throat:     Pharynx: No oropharyngeal exudate.  Eyes:     General: No scleral icterus.    Pupils: Pupils are equal, round, and reactive to light.  Neck:     Vascular: No JVD.  Cardiovascular:     Rate and Rhythm: Normal rate and regular rhythm.     Heart sounds: No murmur heard. Pulmonary:     Effort: Pulmonary effort is normal. No respiratory distress.     Breath sounds: Normal breath sounds. No rales.  Chest:     Chest wall: No tenderness.  Abdominal:     General: Bowel sounds are normal. There is  no distension.     Palpations: Abdomen is soft.     Tenderness: There is no abdominal tenderness.  Musculoskeletal:        General: Normal range of motion.     Cervical back: Normal range of motion and neck supple.  Lymphadenopathy:     Cervical: No cervical adenopathy.  Skin:    General: Skin is warm and dry.     Findings: No erythema or rash.     Comments: Right anterior medi port +   Neurological:     Mental Status: She is alert and oriented to person, place, and time.     Cranial Nerves: No cranial nerve deficit.     Motor: No abnormal muscle tone.     Coordination: Coordination normal.  Psychiatric:        Mood and Affect: Affect normal.        Judgment: Judgment normal.       LABORATORY DATA: I have personally reviewed the data as listed: CBC    Component Value Date/Time   WBC 3.3 (L) 04/08/2021 0834   RBC 3.02 (L) 04/08/2021 0834   HGB 10.3 (L) 04/08/2021 0834   HCT 31.1 (L) 04/08/2021 0834   PLT 146 (L) 04/08/2021 0834   MCV 103.0 (H) 04/08/2021 0834   MCH 34.1 (H) 04/08/2021 0834   MCHC 33.1 04/08/2021 0834   RDW 15.4 04/08/2021 0834   LYMPHSABS 0.8 04/08/2021 0834   MONOABS 0.5 04/08/2021 0834   EOSABS 0.1 04/08/2021 0834   BASOSABS 0.0 04/08/2021 0834   CMP Latest Ref Rng & Units 04/08/2021 03/25/2021 03/10/2021  Glucose 70 - 99 mg/dL 110(H) 90 119(H)  BUN 8 - 23 mg/dL _0 Creatinine 0.44 - 1.00 mg/dL 0.55 0.50 0.67  Sodium 135 - 145 mmol/L 129(L) 127(L) 131(L)  Potassium 3.5 - 5.1 mmol/L 4.0 4.2 4.5  Chloride 98 - 111 mmol/L 99 98 96(L)  CO2 22 - 32 mmol/L _1 Calcium 8.9 - 10.3 mg/dL 9.4 9.4 10.1  Total Protein 6.5 - 8.1 g/dL 7.0 6.9 7.8  Total Bilirubin 0.3 - 1.2 mg/dL 0.5 0.5 0.4  Alkaline Phos 38 - 126 U/L 51 61 63  AST 15 - 41 U/L _2 ALT 0 - 44 U/L _3 Pathology 11/16/2016 Surgical Pathology  CASE: [id number]  PATIENT: Katie Hill  Surgical Pathology Report   SPECIMEN SUBMITTED:  A. Retroperitoneal  adenopathy, left  DIAGNOSIS:  A. LYMPH NODE, LEFT RETROPERITONEAL; CT-GUIDED CORE BIOPSY:  - METASTATIC HIGH-GRADE SEROUS CARCINOMA.  Pathology 03/14/2017   DIAGNOSIS:  A. OMENTUM; OMENTECTOMY:  - NO TUMOR SEEN.  - ONE NEGATIVE LYMPH NODE (0/1).   B.  RIGHT  FALLOPIAN TUBE AND OVARY; SALPINGO-OOPHORECTOMY:  - SMALL FOCI OF HIGH GRADE SEROUS CARCINOMA INVOLVING THE OVARY.  - MARKED THERAPY RELATED CHANGE.  - NO TUMOR SEEN IN THE FALLOPIAN TUBE.   C.  UTERUS, CERVIX, LEFT FALLOPIAN TUBE AND OVARY; HYSTERECTOMY AND LEFT  SALPINGO-OOPHORECTOMY:  - NABOTHIAN CYSTS.  - CYSTIC ATROPHY OF THE ENDOMETRIUM.  - SEROSAL ADHESIONS.  - UNREMARKABLE FALLOPIAN TUBE.  - OVARY SHOWING TREATMENT RELATED CHANGE.   D.  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    RADIOGRAPHIC STUDIES: I have personally reviewed the radiological images as listed and agreed with the findings in the report. CT CHEST ABDOMEN PELVIS W CONTRAST  Result Date: 01/27/2021 CLINICAL DATA:  Ovarian cancer restaging, recurrent EXAM: CT CHEST, ABDOMEN, AND PELVIS WITH CONTRAST TECHNIQUE: Multidetector CT imaging of the chest, abdomen and pelvis was performed following the standard protocol during bolus administration of intravenous contrast. CONTRAST:  75m OMNIPAQUE IOHEXOL 300 MG/ML SOLN, additional oral enteric contrast COMPARISON:  10/06/2020 FINDINGS: CT CHEST FINDINGS Cardiovascular: Right chest port catheter. Normal heart size. No pericardial effusion. Mediastinum/Nodes: Unchanged enlarged left superior mediastinal and prevascular nodes, measuring up to 1.6 x 1.3 cm (series 2, image 15). Thyroid gland, trachea, and esophagus demonstrate no significant findings. Lungs/Pleura: Interval decrease in size of a nodule of the peripheral right lower lobe, now measuring no greater than 2 mm, previously 4 mm (series 3, image 94). Unchanged 6 mm nodule of the  dependent left lower lobe (series 3, image 98). Minimal scarring of the dependent bilateral lung bases. No pleural effusion or pneumothorax. Musculoskeletal: No chest wall mass or suspicious bone lesions identified. CT ABDOMEN PELVIS FINDINGS Hepatobiliary: No solid liver abnormality is seen. No gallstones, gallbladder wall thickening, or biliary dilatation. Pancreas: Unremarkable. No pancreatic ductal dilatation or surrounding inflammatory changes. Spleen: Normal in size without significant abnormality. Adrenals/Urinary Tract: Adrenal glands are unremarkable. Kidneys are normal, without renal calculi, solid lesion, or hydronephrosis. Bladder is unremarkable. Stomach/Bowel: Stomach is within normal limits. Appendix is not clearly visualized and may be surgically absent. Status post omentectomy. No evidence of bowel wall thickening, distention, or inflammatory changes. Vascular/Lymphatic: Scattered aortic atherosclerosis. Unchanged enlarged left retroperitoneal lymph nodes, measuring up to 2.1 x 1.5 cm, previously 2.2 x 1.6 cm (series 2, image 69). Reproductive: Status post hysterectomy and oophorectomy. Unchanged soft tissue nodule in the posterior pelvis measuring 2.8 x 1.6 cm (series 2, image 113). Other: No abdominal wall hernia or abnormality. Musculoskeletal: No acute or significant osseous findings. IMPRESSION: 1. Interval decrease in size of a nodule of the peripheral right lower lobe, now measuring no greater than 2 mm, previously 4 mm. This may reflect treatment response of a solitary pulmonary metastasis, although resolution of nonspecific infection inflammation is a differential consideration. An additional 6 mm nodule of the dependent left lower lobe remains unchanged. Attention on follow-up. 2. Unchanged enlarged left superior mediastinal and prevascular lymph nodes. 3. Unchanged enlarged left retroperitoneal lymph nodes. 4. Findings are consistent with stable nodal metastatic disease. 5. Unchanged soft  tissue nodule in the posterior pelvis. 6. Status post hysterectomy, oophorectomy, and omentectomy. Aortic Atherosclerosis (ICD10-I70.0). Electronically Signed   By: ADelanna AhmadiM.D.   On: 01/27/2021 08:45   ECHOCARDIOGRAM COMPLETE  Result Date: 01/14/2021    ECHOCARDIOGRAM REPORT   Patient Name:   Katie KOSAKDate of Exam: 01/14/2021 Medical Rec #:  [id number]       Height:  68.5 in Accession #:    [id number]       Weight:       119.0 lb Date of Birth:  August 05, 1950        BSA:          1.648 m Patient Age:    54 years         BP:           125/72 mmHg Patient Gender: F                HR:           55 bpm. Exam Location:  [facility] Procedure: 2D Echo, Cardiac Doppler, Color Doppler and Strain Analysis Indications:     Chemo Z09  History:         Patient has no prior history of Echocardiogram examinations.                  Dysrhythmia, High grade ovarian cancer.  Sonographer:     Sherrie Sport Referring Phys:  [id number] [place] Diagnosing Phys: Kathlyn Sacramento MD  Sonographer Comments: Suboptimal apical window. Global longitudinal strain was attempted. IMPRESSIONS  1. Left ventricular ejection fraction, by estimation, is 55 to 60%. The left ventricle has normal function. The left ventricle has no regional wall motion abnormalities. Left ventricular diastolic parameters were normal.  2. Right ventricular systolic function is normal. The right ventricular size is normal. There is normal pulmonary artery systolic pressure.  3. The mitral valve is normal in structure. No evidence of mitral valve regurgitation. No evidence of mitral stenosis.  4. The aortic valve is normal in structure. Aortic valve regurgitation is not visualized. No aortic stenosis is present.  5. The inferior vena cava is normal in size with greater than 50% respiratory variability, suggesting right atrial pressure of 3 mmHg. FINDINGS  Left Ventricle: Left ventricular ejection fraction, by estimation, is 55 to 60%. The left ventricle has normal  function. The left ventricle has no regional wall motion abnormalities. Global longitudinal strain performed but not reported based on interpreter judgement due to suboptimal tracking. The left ventricular internal cavity size was normal in size. There is no left ventricular hypertrophy. Left ventricular diastolic parameters were normal. Right Ventricle: The right ventricular size is normal. No increase in right ventricular wall thickness. Right ventricular systolic function is normal. There is normal pulmonary artery systolic pressure. The tricuspid regurgitant velocity is 2.52 m/s, and  with an assumed right atrial pressure of 3 mmHg, the estimated right ventricular systolic pressure is 41.9 mmHg. Left Atrium: Left atrial size was normal in size. Right Atrium: Right atrial size was normal in size. Pericardium: There is no evidence of pericardial effusion. Mitral Valve: The mitral valve is normal in structure. No evidence of mitral valve regurgitation. No evidence of mitral valve stenosis. Tricuspid Valve: The tricuspid valve is normal in structure. Tricuspid valve regurgitation is trivial. No evidence of tricuspid stenosis. Aortic Valve: The aortic valve is normal in structure. Aortic valve regurgitation is not visualized. No aortic stenosis is present. Aortic valve mean gradient measures 3.0 mmHg. Aortic valve peak gradient measures 4.2 mmHg. Aortic valve area, by VTI measures 2.47 cm. Pulmonic Valve: The pulmonic valve was normal in structure. Pulmonic valve regurgitation is not visualized. No evidence of pulmonic stenosis. Aorta: The aortic root is normal in size and structure. Venous: The inferior vena cava is normal in size with greater than 50% respiratory variability, suggesting right atrial pressure of 3 mmHg. IAS/Shunts: No atrial  level shunt detected by color flow Doppler.  LEFT VENTRICLE PLAX 2D LVIDd:         4.24 cm   Diastology LVIDs:         2.83 cm   LV e' medial:    6.96 cm/s LV PW:         0.92  cm   LV E/e' medial:  9.9 LV IVS:        0.88 cm   LV e' lateral:   7.18 cm/s LVOT diam:     2.00 cm   LV E/e' lateral: 9.6 LV SV:         48 LV SV Index:   29 LVOT Area:     3.14 cm                           3D Volume EF:                          3D EF:        54 %                          LV EDV:       493 ml                          LV ESV:       227 ml                          LV SV:        266 ml LEFT ATRIUM           Index        RIGHT ATRIUM           Index LA diam:      2.90 cm 1.76 cm/m   RA Area:     11.40 cm LA Vol (A2C): 42.2 ml 25.61 ml/m  RA Volume:   27.40 ml  16.63 ml/m LA Vol (A4C): 18.0 ml 10.92 ml/m  AORTIC VALVE                    PULMONIC VALVE AV Area (Vmax):    2.35 cm     PV Vmax:        0.70 m/s AV Area (Vmean):   2.14 cm     PV Peak grad:   1.9 mmHg AV Area (VTI):     2.47 cm     RVOT Peak grad: 2 mmHg AV Vmax:           103.00 cm/s AV Vmean:          74.400 cm/s AV VTI:            0.196 m AV Peak Grad:      4.2 mmHg AV Mean Grad:      3.0 mmHg LVOT Vmax:         77.00 cm/s LVOT Vmean:        50.700 cm/s LVOT VTI:          0.154 m LVOT/AV VTI ratio: 0.79  AORTA Ao Root diam: 3.10 cm MITRAL VALVE               TRICUSPID VALVE MV Area (PHT): 4.01 cm    TR Peak grad:   25.4 mmHg  MV Decel Time: 189 msec    TR Vmax:        252.00 cm/s MV E velocity: 69.00 cm/s MV A velocity: 62.60 cm/s  SHUNTS MV E/A ratio:  1.10        Systemic VTI:  0.15 m                            Systemic Diam: 2.00 cm Kathlyn Sacramento MD Electronically signed by Kathlyn Sacramento MD Signature Date/Time: 01/14/2021/1:08:59 PM    Final      ASSESSMENT/PLAN  Cancer Staging  Malignant neoplasm of ovary [facility] Staging form: Ovary, Fallopian Tube, and Primary Peritoneal Carcinoma, AJCC 8th Edition - Clinical stage from 11/22/2016: Stage IIIC (cT3c, cN1b, cM0) - Signed by Earlie Server, MD on 11/22/2016 Stage used in treatment planning: Yes National guidelines used in treatment planning: Yes Type of national guideline used  in treatment planning: NCCN  1. High grade ovarian cancer ([place])   2. Encounter for antineoplastic chemotherapy   3. Anemia associated with chemotherapy   4. Hyponatremia   5. Acquired hypothyroidism   6. Chemotherapy induced neutropenia (HCC)   : # Ovarian Cancer, local recurrence.  Currently on olaparib 300 mg twice daily. She tolerates well.  CA 125 18.7-->61.2-->111-->90.5-->69--> 54.8--> 29.1-->47-->71.2-->67.7->73.2-->82.8-->70.4-->66.4--> 58.4--> 64.9--> 56.9--> 42.2-->47.7-->54.4-->68.2-> 97-->168-->70.3--> 57.4--> 49.4->33.7-->32 01/26/2021, CT chest abdomen pelvis-partial response-decrease of lung nodule.  Unchanged lymphadenopathy. Labs reviewed and discussed with patient. Proceed with cycle 6 Doxil and bevacizumab today.   #Hypertension, likely secondary to bevacizumab.   Continue Norvasc 2.5 mg daily. Chronic anemia, chemotherapy-induced.  Counts slightly decreased.  Anticipated.  Monitor. Chronic macrocytosis with adequate B12 and folate  #hypothyroidism, continue Synthroid 50 MCG daily.  TSH has decreased to 11.9, free T4 is within normal limits.  I will continue Synthroid at current dose.  Refill sent to pharmacy. Hyponatremia, likely secondary to hypothyroidism.  Sodium level has improved to 129.    Port-A-Cath in place Patient declined influenza vaccination.  RTF 2 weeks for lab  bevacizumab. 4 weeks for lab Doxil and bevacizumab.    Earlie Server, MD, PhD 04/09/21

## 2021-04-21 ENCOUNTER — Ambulatory Visit (INDEPENDENT_AMBULATORY_CARE_PROVIDER_SITE_OTHER): Payer: Medicare Other

## 2021-04-21 DIAGNOSIS — Z0000 Encounter for general adult medical examination without abnormal findings: Secondary | ICD-10-CM | POA: Diagnosis not present

## 2021-04-21 NOTE — Progress Notes (Signed)
I have reviewed this visit and agree with the documentation.   

## 2021-04-21 NOTE — Progress Notes (Signed)
Subjective:   Katie Hill is a 71 y.o. female who presents for Medicare Annual (Subsequent) preventive examination. I discussed the limitations of evaluation and management by telemedicine and the availability of in person appointments. The patient expressed understanding and agreed to proceed.   Visit performed by audio   Patient location: Home  Provider location: Home  Review of Systems    N/A       Objective:    There were no vitals filed for this visit. There is no height or weight on file to calculate BMI.  Advanced Directives 04/21/2021 03/10/2021 02/02/2021 02/02/2021 01/19/2021 01/19/2021 01/05/2021  Does Patient Have a Medical Advance Directive? No No No No No No No  Does patient want to make changes to medical advance directive? - - No - Patient declined - No - Patient declined - -  Would patient like information on creating a medical advance directive? - - - - - No - Patient declined -    Current Medications (verified) Outpatient Encounter Medications as of 04/21/2021  Medication Sig   amLODipine (NORVASC) 2.5 MG tablet Take 1 tablet (2.5 mg total) by mouth daily.   hydrocortisone cream 0.5 % Apply 1 application topically 3 (three) times daily.   levothyroxine (SYNTHROID) 50 MCG tablet Take 1 tablet (50 mcg total) by mouth daily before breakfast.   lidocaine-prilocaine (EMLA) cream Apply 1 application topically as needed. Apply small amount to port site at least 1 hour prior to it being accessed, cover with plastic wrap   ondansetron (ZOFRAN) 8 MG tablet Take 1 tablet (8 mg total) by mouth every 8 (eight) hours as needed for nausea or vomiting.   No facility-administered encounter medications on file as of 04/21/2021.    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   Pelvic mass in female    Past Surgical History:  Procedure Laterality Date   APPENDECTOMY     LAPAROSCOPY N/A 03/14/2017   Procedure: LAPAROSCOPY OPERATIVE;  Surgeon: Mellody Drown, MD;  Location: [facility];  Service: Gynecology;  Laterality: N/A;   LAPAROTOMY N/A 03/14/2017   Procedure: LAPAROTOMY;  Surgeon: Mellody Drown, MD;  Location: [facility];  Service: Gynecology;  Laterality: N/A;   LYMPH NODE DISSECTION N/A 03/14/2017   Procedure: LYMPH NODE DISSECTION;  Surgeon: Mellody Drown, MD;  Location: [facility];  Service: Gynecology;  Laterality: N/A;   OMENTECTOMY N/A 03/14/2017   Procedure: OMENTECTOMY;  Surgeon: Mellody Drown, MD;  Location: [facility];  Service: Gynecology;  Laterality: N/A;   PORTA CATH INSERTION N/A 11/27/2016   Procedure: Glori Luis Cath Insertion;  Surgeon: Algernon Huxley, MD;  Location: [facility];  Service: Cardiovascular;  Laterality: N/A;   Family History  Problem Relation Age of Onset   Throat cancer Cousin    Throat cancer Cousin    Leukemia Cousin    Social History   Socioeconomic History   Marital status: Married    Spouse name: Not on file   Number of children: Not on file   Years of education: Not on file   Highest education level: Not on file  Occupational History   Not on file  Tobacco Use   Smoking status: Never   Smokeless tobacco: Never  Vaping Use   Vaping Use: Never used  Substance and  Sexual Activity   Alcohol use: Not Currently   Drug use: No   Sexual activity: Yes    Birth control/protection: Post-menopausal  Other Topics Concern   Not on file  Social History Narrative   Not on file   Social Determinants of Health   Financial Resource Strain: Low Risk    Difficulty of Paying Living Expenses: Not hard at all  Food Insecurity: No Food Insecurity   Worried About Charity fundraiser in the Last Year: Never true   [place] in the Last Year: Never true  Transportation Needs: No Transportation Needs    Lack of Transportation (Medical): No   Lack of Transportation (Non-Medical): No  Physical Activity: Insufficiently Active   Days of Exercise per Week: 6 days   Minutes of Exercise per Session: 20 min  Stress: No Stress Concern Present   Feeling of Stress : Not at all  Social Connections: Moderately Isolated   Frequency of Communication with Friends and Family: Three times a week   Frequency of Social Gatherings with Friends and Family: Twice a week   Attends Religious Services: Never   Marine scientist or Organizations: No   Attends Music therapist: Never   Marital Status: Married    Tobacco Counseling Counseling given: Not Answered   Clinical Intake:  Pre-visit preparation completed: Yes  Pain : No/denies pain     Diabetes: No  How often do you need to have someone help you when you read instructions, pamphlets, or other written materials from your doctor or pharmacy?: 1 - Never What is the last grade level you completed in school?: 12th grade  Diabetic? No  Interpreter Needed?: No  Information entered by :: Anson [place] CMA   Activities of Daily Living In your present state of health, do you have any difficulty performing the following activities: 04/21/2021  Hearing? N  Vision? N  Difficulty concentrating or making decisions? N  Walking or climbing stairs? N  Dressing or bathing? N  Doing errands, shopping? N  Preparing Food and eating ? N  Using the Toilet? N  In the past six months, have you accidently leaked urine? N  Do you have problems with loss of bowel control? N  Managing your Medications? N  Managing your Finances? N  Housekeeping or managing your Housekeeping? N  Some recent data might be hidden    Patient Care Team: Cletis Athens, MD as PCP - General (Internal Medicine) Clent Jacks, [name], [name], MD as Consulting  Physician (Oncology) Gae Dry, MD as Referring Physician (Obstetrics and Gynecology)  Indicate any recent Medical Services you may have received from other than [facility] providers in the past year (date may be approximate).     Assessment:   This is a routine wellness examination for [place].  Hearing/Vision screen No results found.  Dietary issues and exercise activities discussed: Current Exercise Habits: Home exercise routine   Goals Addressed   None    Depression Screen PHQ 2/9 Scores 04/21/2021  PHQ - 2 Score 0    Fall Risk Fall Risk  04/21/2021  Falls in the past year? 0  Number falls in past yr: 0  Injury with Fall? 0  Risk for fall due to : No Fall Risks  Follow up Falls evaluation completed    [place]:  Any stairs in or around  the home? Yes  If so, are there any without handrails? No  Home free of loose throw rugs in walkways, pet beds, electrical cords, etc? Yes  Adequate lighting in your home to reduce risk of falls? Yes   ASSISTIVE DEVICES UTILIZED TO PREVENT FALLS:  Life alert? No  Use of a cane, walker or w/c? No  Grab bars in the bathroom? Yes  Shower chair or bench in shower? Yes  Elevated toilet seat or a handicapped toilet? Yes   TIMED UP AND GO:  Was the test performed? No .  Length of time to ambulate 10 feet: 0 sec.    Cognitive Function:     6CIT Screen 04/21/2021  What Year? 0 points  What month? 0 points  What time? 0 points  Count back from 20 0 points  Months in reverse 0 points  Repeat phrase 0 points  Total Score 0    Immunizations Immunization History  Administered Date(s) Administered   Influenza Inj Mdck Quad Pf 01/25/2017   Influenza, High Dose Seasonal PF 02/07/2018   Influenza-Unspecified 01/25/2017   PFIZER(Purple Top)SARS-COV-2 Vaccination 06/15/2019, 07/07/2019    TDAP status: Due, Education has been provided regarding the importance of this vaccine. Advised may  receive this vaccine at local pharmacy or Health Dept. Aware to provide a copy of the vaccination record if obtained from local pharmacy or Health Dept. Verbalized acceptance and understanding.  Flu Vaccine status: Declined, Education has been provided regarding the importance of this vaccine but patient still declined. Advised may receive this vaccine at local pharmacy or Health Dept. Aware to provide a copy of the vaccination record if obtained from local pharmacy or Health Dept. Verbalized acceptance and understanding.  Pneumococcal vaccine status: Due, Education has been provided regarding the importance of this vaccine. Advised may receive this vaccine at local pharmacy or Health Dept. Aware to provide a copy of the vaccination record if obtained from local pharmacy or Health Dept. Verbalized acceptance and understanding.  Covid-19 vaccine status: Declined, Education has been provided regarding the importance of this vaccine but patient still declined. Advised may receive this vaccine at local pharmacy or Health Dept.or vaccine clinic. Aware to provide a copy of the vaccination record if obtained from local pharmacy or Health Dept. Verbalized acceptance and understanding.  Qualifies for Shingles Vaccine? Yes   Zostavax completed No   Shingrix Completed?: No.    Education has been provided regarding the importance of this vaccine. Patient has been advised to call insurance company to determine out of pocket expense if they have not yet received this vaccine. Advised may also receive vaccine at local pharmacy or Health Dept. Verbalized acceptance and understanding.  Screening Tests Health Maintenance  Topic Date Due   Pneumonia Vaccine 85+ Years old (1 - PCV) Never done   Hepatitis C Screening  Never done   TETANUS/TDAP  Never done   Zoster Vaccines- Shingrix (1 of 2) Never done   COVID-19 Vaccine (3 - Pfizer risk series) 05/07/2021 (Originally 08/04/2019)   MAMMOGRAM  04/21/2022 (Originally  03/17/2002)   DEXA SCAN  04/21/2022 (Originally 03/17/2017)   COLONOSCOPY (Pts 45-36yrs Insurance coverage will need to be confirmed)  04/21/2022 (Originally 03/17/1997)   INFLUENZA VACCINE  01/02/2024 (Originally 11/08/2020)   HPV VACCINES  Aged Out    Health Maintenance  Health Maintenance Due  Topic Date Due   Pneumonia Vaccine 77+ Years old (1 - PCV) Never done   Hepatitis C Screening  Never done   TETANUS/TDAP  Never done   Zoster Vaccines- Shingrix (1 of 2) Never done    Colorectal cancer screening: Type of screening: Colonoscopy. Completed No pt declined. Repeat every 10 years  Mammogram status: Completed No patient declined. Repeat every year   Lung Cancer Screening: (Low Dose CT Chest recommended if Age 22-80 years, 30 pack-year currently smoking OR have quit w/in 15years.) does not qualify.   Lung Cancer Screening Referral: No  Additional Screening:  Hepatitis C Screening: does qualify; Completed No  Vision Screening: Recommended annual ophthalmology exams for early detection of glaucoma and other disorders of the eye. Is the patient up to date with their annual eye exam?  Yes  Who is the provider or what is the name of the office in which the patient attends annual eye exams? Patient sees a eye doctor in [place] If pt is not established with a provider, would they like to be referred to a provider to establish care? No .   Dental Screening: Recommended annual dental exams for proper oral hygiene  Community Resource Referral / Chronic Care Management: CRR required this visit?  No   CCM required this visit?  No      Plan:     I have personally reviewed and noted the following in the patients chart:   Medical and social history Use of alcohol, tobacco or illicit drugs  Current medications and supplements including opioid prescriptions.  Functional ability and status Nutritional status Physical activity Advanced directives List of other  physicians Hospitalizations, surgeries, and ER visits in previous 12 months Vitals Screenings to include cognitive, depression, and falls Referrals and appointments  In addition, I have reviewed and discussed with patient certain preventive protocols, quality metrics, and best practice recommendations. A written personalized care plan for preventive services as well as general preventive health recommendations were provided to patient.    Ms. Kubicki , Thank you for taking time to come for your Medicare Wellness Visit. I appreciate your ongoing commitment to your health goals. Please review the following plan we discussed and let me know if I can assist you in the future.   These are the goals we discussed:  Goals   None     This is a list of the screening recommended for you and due dates:  Health Maintenance  Topic Date Due   Pneumonia Vaccine (1 - PCV) Never done   Hepatitis C Screening: USPSTF Recommendation to screen - Ages 49-79 yo.  Never done   Tetanus Vaccine  Never done   Zoster (Shingles) Vaccine (1 of 2) Never done   COVID-19 Vaccine (3 - Pfizer risk series) 05/07/2021*   Mammogram  04/21/2022*   DEXA scan (bone density measurement)  04/21/2022*   Colon Cancer Screening  04/21/2022*   Flu Shot  01/02/2024*   HPV Vaccine  Aged Out  *Topic was postponed. The date shown is not the original due date.     Renato Gails, [place]   04/21/2021

## 2021-04-22 ENCOUNTER — Inpatient Hospital Stay: Payer: Medicare Other | Attending: Oncology

## 2021-04-22 ENCOUNTER — Other Ambulatory Visit: Payer: Self-pay

## 2021-04-22 ENCOUNTER — Inpatient Hospital Stay: Payer: Medicare Other

## 2021-04-22 VITALS — BP 151/85 | HR 77 | Temp 97.3°F | Resp 18

## 2021-04-22 DIAGNOSIS — E871 Hypo-osmolality and hyponatremia: Secondary | ICD-10-CM | POA: Insufficient documentation

## 2021-04-22 DIAGNOSIS — I10 Essential (primary) hypertension: Secondary | ICD-10-CM | POA: Insufficient documentation

## 2021-04-22 DIAGNOSIS — C561 Malignant neoplasm of right ovary: Secondary | ICD-10-CM | POA: Diagnosis not present

## 2021-04-22 DIAGNOSIS — D701 Agranulocytosis secondary to cancer chemotherapy: Secondary | ICD-10-CM | POA: Diagnosis not present

## 2021-04-22 DIAGNOSIS — D6481 Anemia due to antineoplastic chemotherapy: Secondary | ICD-10-CM | POA: Diagnosis not present

## 2021-04-22 DIAGNOSIS — Z5111 Encounter for antineoplastic chemotherapy: Secondary | ICD-10-CM | POA: Insufficient documentation

## 2021-04-22 DIAGNOSIS — Z5112 Encounter for antineoplastic immunotherapy: Secondary | ICD-10-CM | POA: Diagnosis not present

## 2021-04-22 DIAGNOSIS — C569 Malignant neoplasm of unspecified ovary: Secondary | ICD-10-CM

## 2021-04-22 DIAGNOSIS — E039 Hypothyroidism, unspecified: Secondary | ICD-10-CM | POA: Diagnosis not present

## 2021-04-22 LAB — COMPREHENSIVE METABOLIC PANEL
ALT: 10 U/L (ref 0–44)
AST: 18 U/L (ref 15–41)
Albumin: 3.9 g/dL (ref 3.5–5.0)
Alkaline Phosphatase: 67 U/L (ref 38–126)
Anion gap: 3 — ABNORMAL LOW (ref 5–15)
BUN: 13 mg/dL (ref 8–23)
CO2: 24 mmol/L (ref 22–32)
Calcium: 9.2 mg/dL (ref 8.9–10.3)
Chloride: 99 mmol/L (ref 98–111)
Creatinine, Ser: 0.46 mg/dL (ref 0.44–1.00)
GFR, Estimated: >60 mL/min (ref >60)
Glucose, Bld: 111 mg/dL — ABNORMAL HIGH (ref 70–99)
Potassium: 4 mmol/L (ref 3.5–5.1)
Sodium: 126 mmol/L — ABNORMAL LOW (ref 135–145)
Total Bilirubin: 0.6 mg/dL (ref 0.3–1.2)
Total Protein: 6.8 g/dL (ref 6.5–8.1)

## 2021-04-22 LAB — CBC WITH DIFFERENTIAL/PLATELET
Abs Immature Granulocytes: 0.08 10*3/uL — ABNORMAL HIGH (ref 0.00–0.07)
Basophils Absolute: 0.1 10*3/uL (ref 0.0–0.1)
Basophils Relative: 2 %
Eosinophils Absolute: 0.1 10*3/uL (ref 0.0–0.5)
Eosinophils Relative: 3 %
HCT: 30.1 % — ABNORMAL LOW (ref 36.0–46.0)
Hemoglobin: 10.3 g/dL — ABNORMAL LOW (ref 12.0–15.0)
Immature Granulocytes: 2 %
Lymphocytes Relative: 14 %
Lymphs Abs: 0.7 10*3/uL (ref 0.7–4.0)
MCH: 35 pg — ABNORMAL HIGH (ref 26.0–34.0)
MCHC: 34.2 g/dL (ref 30.0–36.0)
MCV: 102.4 fL — ABNORMAL HIGH (ref 80.0–100.0)
Monocytes Absolute: 0.3 10*3/uL (ref 0.1–1.0)
Monocytes Relative: 6 %
Neutro Abs: 3.7 10*3/uL (ref 1.7–7.7)
Neutrophils Relative %: 73 %
Platelets: 131 10*3/uL — ABNORMAL LOW (ref 150–400)
RBC: 2.94 MIL/uL — ABNORMAL LOW (ref 3.87–5.11)
RDW: 15.2 % (ref 11.5–15.5)
WBC: 5 10*3/uL (ref 4.0–10.5)
nRBC: 0 % (ref 0.0–0.2)

## 2021-04-22 LAB — PROTEIN, URINE, RANDOM: Total Protein, Urine: 44 mg/dL

## 2021-04-22 MED ORDER — SODIUM CHLORIDE 0.9 % IV SOLN
Freq: Once | INTRAVENOUS | Status: AC
  Filled 2021-04-22: qty 250

## 2021-04-22 MED ORDER — SODIUM CHLORIDE 0.9 % IV SOLN
10.0000 mg/kg | Freq: Once | INTRAVENOUS | Status: AC
  Administered 2021-04-22: 500 mg via INTRAVENOUS
  Filled 2021-04-22: qty 16

## 2021-04-22 MED ORDER — HEPARIN SOD (PORK) LOCK FLUSH 100 UNIT/ML IV SOLN
INTRAVENOUS | Status: AC
  Filled 2021-04-22: qty 5

## 2021-04-22 NOTE — Progress Notes (Signed)
MD aware of Na+ of 126. Pt denies symptoms.  Per MD- can use urine results from 12/30 for tx today.

## 2021-04-22 NOTE — Patient Instructions (Signed)
[facility] [place]  Discharge Instructions: Thank you for choosing [facility] to provide your oncology and hematology care.  If you have a lab appointment with the [facility], please go directly to the [facility] and check in at the registration area.  Wear comfortable clothing and clothing appropriate for easy access to any Portacath or PICC line.   We strive to give you quality time with your provider. You may need to reschedule your appointment if you arrive late (15 or more minutes).  Arriving late affects you and other patients whose appointments are after yours.  Also, if you miss three or more appointments without notifying the office, you may be dismissed from the clinic at the providers discretion.      For prescription refill requests, have your pharmacy contact our office and allow 72 hours for refills to be completed.    Today you received the following chemotherapy and/or immunotherapy agents : Avastin   To help prevent nausea and vomiting after your treatment, we encourage you to take your nausea medication as directed.  BELOW ARE SYMPTOMS THAT SHOULD BE REPORTED IMMEDIATELY: *FEVER GREATER THAN 100.4 F (38 C) OR HIGHER *CHILLS OR SWEATING *NAUSEA AND VOMITING THAT IS NOT CONTROLLED WITH YOUR NAUSEA MEDICATION *UNUSUAL SHORTNESS OF BREATH *UNUSUAL BRUISING OR BLEEDING *URINARY PROBLEMS (pain or burning when urinating, or frequent urination) *BOWEL PROBLEMS (unusual diarrhea, constipation, pain near the anus) TENDERNESS IN MOUTH AND THROAT WITH OR WITHOUT PRESENCE OF ULCERS (sore throat, sores in mouth, or a toothache) UNUSUAL RASH, SWELLING OR PAIN  UNUSUAL VAGINAL DISCHARGE OR ITCHING   Items with * indicate a potential emergency and should be followed up as soon as possible or go to the Emergency Department if any problems should occur.  Please show the CHEMOTHERAPY ALERT CARD or IMMUNOTHERAPY ALERT CARD at check-in to the  Emergency Department and triage nurse.  Should you have questions after your visit or need to cancel or reschedule your appointment, please contact [facility] [place] [facility] [place]  [phone] and follow the prompts.  Office hours are 8:00 a.m. to 4:30 p.m. Monday - Friday. Please note that voicemails left after 4:00 p.m. may not be returned until the following business day.  We are closed weekends and major holidays. You have access to a nurse at all times for urgent questions. Please call the main number to the clinic [phone] and follow the prompts.  For any non-urgent questions, you may also contact your provider using MyChart. We now offer e-Visits for anyone 11 and older to request care online for non-urgent symptoms. For details visit [email or web address].   Also download the MyChart app! Go to the app store, search "MyChart", open the app, select [facility], and log in with your MyChart username and password.  Due to Covid, a mask is required upon entering the hospital/clinic. If you do not have a mask, one will be given to you upon arrival. For doctor visits, patients may have 1 support person aged 30 or older with them. For treatment visits, patients cannot have anyone with them due to current Covid guidelines and our immunocompromised population.

## 2021-04-25 ENCOUNTER — Ambulatory Visit (INDEPENDENT_AMBULATORY_CARE_PROVIDER_SITE_OTHER): Payer: Medicare Other | Admitting: Internal Medicine

## 2021-04-25 ENCOUNTER — Encounter: Payer: Self-pay | Admitting: Internal Medicine

## 2021-04-25 ENCOUNTER — Other Ambulatory Visit: Payer: Self-pay

## 2021-04-25 VITALS — BP 161/87 | HR 72 | Ht 67.0 in | Wt 119.2 lb

## 2021-04-25 DIAGNOSIS — C569 Malignant neoplasm of unspecified ovary: Secondary | ICD-10-CM

## 2021-04-25 DIAGNOSIS — T451X5A Adverse effect of antineoplastic and immunosuppressive drugs, initial encounter: Secondary | ICD-10-CM

## 2021-04-25 DIAGNOSIS — Z9079 Acquired absence of other genital organ(s): Secondary | ICD-10-CM

## 2021-04-25 DIAGNOSIS — E038 Other specified hypothyroidism: Secondary | ICD-10-CM | POA: Insufficient documentation

## 2021-04-25 DIAGNOSIS — D701 Agranulocytosis secondary to cancer chemotherapy: Secondary | ICD-10-CM

## 2021-04-25 DIAGNOSIS — E063 Autoimmune thyroiditis: Secondary | ICD-10-CM | POA: Diagnosis not present

## 2021-04-25 DIAGNOSIS — Z90722 Acquired absence of ovaries, bilateral: Secondary | ICD-10-CM

## 2021-04-25 DIAGNOSIS — Z90710 Acquired absence of both cervix and uterus: Secondary | ICD-10-CM | POA: Diagnosis not present

## 2021-04-25 MED ORDER — LEVOTHYROXINE SODIUM 100 MCG PO TABS: 100.0000 ug | ORAL_TABLET | Freq: Every day | ORAL | 3 refills | Status: AC

## 2021-04-25 NOTE — Assessment & Plan Note (Signed)
Stable

## 2021-04-25 NOTE — Assessment & Plan Note (Signed)
Stable at the present time. 

## 2021-04-25 NOTE — Progress Notes (Signed)
Established Patient Office Visit  Subjective:  Patient ID: Katie Hill, female    DOB: 12/20/1950  Age: 71 y.o. 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[place]) 11/20/2016   Pelvic mass in female     Past Surgical History:  Procedure Laterality Date   APPENDECTOMY     LAPAROSCOPY N/A 03/14/2017   Procedure: LAPAROSCOPY OPERATIVE;  Surgeon: Mellody Drown, MD;  Location: [facility];  Service: Gynecology;  Laterality: N/A;   LAPAROTOMY N/A 03/14/2017   Procedure: LAPAROTOMY;  Surgeon: Mellody Drown, MD;  Location: [facility];  Service: Gynecology;  Laterality: N/A;   LYMPH NODE DISSECTION N/A 03/14/2017   Procedure: LYMPH NODE DISSECTION;  Surgeon: Mellody Drown, MD;  Location: [facility];  Service: Gynecology;  Laterality: N/A;   OMENTECTOMY N/A 03/14/2017   Procedure: OMENTECTOMY;  Surgeon: Mellody Drown, MD;  Location: [facility];  Service: Gynecology;  Laterality: N/A;   PORTA CATH INSERTION N/A 11/27/2016   Procedure: Glori Luis Cath Insertion;  Surgeon: Algernon Huxley, MD;  Location: [facility];  Service: Cardiovascular;  Laterality: N/A;    Family History  Problem Relation Age of Onset   Throat cancer Cousin    Throat cancer Cousin    Leukemia Cousin     Social History   Socioeconomic History   Marital status: Married    Spouse name: Not on file   Number of children: Not on file   Years of education: Not on file   Highest education level: Not on file  Occupational History   Not on file  Tobacco Use   Smoking status: Never   Smokeless tobacco: Never  Vaping Use   Vaping Use: Never used  Substance and Sexual Activity   Alcohol use: Not Currently   Drug use: No   Sexual  activity: Yes    Birth control/protection: Post-menopausal  Other Topics Concern   Not on file  Social History Narrative   Not on file   Social Determinants of Health   Financial Resource Strain: Low Risk    Difficulty of Paying Living Expenses: Not hard at all  Food Insecurity: No Food Insecurity   Worried About Charity fundraiser in the Last Year: Never true   [place] in the Last Year: Never true  Transportation Needs: No Transportation Needs   Lack of Transportation (Medical): No   Lack of Transportation (Non-Medical): No  Physical Activity: Insufficiently Active   Days of Exercise per Week: 6 days   Minutes of Exercise per Session: 20 min  Stress: No Stress Concern Present   Feeling of Stress : Not at all  Social Connections: Moderately Isolated   Frequency of Communication with Friends and Family: Three times a week   Frequency of Social Gatherings with Friends and Family: Twice a week   Attends Religious Services: Never   Marine scientist or Organizations: No   Attends Music therapist: Never   Marital Status: Married  Human resources officer Violence: Not At Risk   Fear of Current or Ex-Partner: No   Emotionally Abused: No   Physically Abused: No   Sexually Abused: No     Current  Outpatient Medications:    amLODipine (NORVASC) 2.5 MG tablet, Take 1 tablet (2.5 mg total) by mouth daily., Disp: 30 tablet, Rfl: 4   hydrocortisone cream 0.5 %, Apply 1 application topically 3 (three) times daily., Disp: 30 g, Rfl: 0   levothyroxine (SYNTHROID) 100 MCG tablet, Take 1 tablet (100 mcg total) by mouth daily., Disp: 90 tablet, Rfl: 3   lidocaine-prilocaine (EMLA) cream, Apply 1 application topically as needed. Apply small amount to port site at least 1 hour prior to it being accessed, cover with plastic wrap, Disp: 30 g, Rfl: 1   ondansetron (ZOFRAN) 8 MG tablet, Take 1 tablet (8 mg total) by mouth every 8 (eight) hours as needed for nausea or vomiting.,  Disp: 60 tablet, Rfl: 1   Allergies  Allergen Reactions   Omeprazole Rash    ROS Review of Systems  Constitutional: Negative.  Negative for appetite change and chills.  HENT: Negative.  Negative for ear pain.   Eyes: Negative.  Negative for pain.  Respiratory: Negative.  Negative for cough and choking.   Cardiovascular: Negative.  Negative for chest pain.  Gastrointestinal: Negative.  Negative for abdominal distention.  Endocrine: Negative.   Genitourinary: Negative.  Negative for flank pain and hematuria.  Musculoskeletal: Negative.   Skin: Negative.   Allergic/Immunologic: Negative.   Neurological: Negative.  Negative for speech difficulty.  Hematological: Negative.   Psychiatric/Behavioral: Negative.  Negative for agitation and sleep disturbance.   All other systems reviewed and are negative.    Objective:    Physical Exam Constitutional:      Appearance: She is normal weight. She is ill-appearing.  HENT:     Head: Atraumatic.     Nose: Nose normal.  Cardiovascular:     Rate and Rhythm: Normal rate.     Pulses: Normal pulses.  Pulmonary:     Effort: Pulmonary effort is normal.  Abdominal:     General: Abdomen is flat. Bowel sounds are normal.  Musculoskeletal:        General: Normal range of motion.  Skin:    General: Skin is warm.  Psychiatric:        Mood and Affect: Mood normal.    BP (!) 161/87    Pulse 72    Ht 5\' 7"  (1.702 m)    Wt 119 lb 3.2 oz (54.1 kg)    BMI 18.67 kg/m  Wt Readings from Last 3 Encounters:  04/25/21 119 lb 3.2 oz (54.1 kg)  04/08/21 118 lb 12.8 oz (53.9 kg)  03/25/21 119 lb 3.2 oz (54.1 kg)     Health Maintenance Due  Topic Date Due   Pneumonia Vaccine 61+ Years old (1 - PCV) Never done   Hepatitis C Screening  Never done   TETANUS/TDAP  Never done   Zoster Vaccines- Shingrix (1 of 2) Never done    There are no preventive care reminders to display for this patient.  Lab Results  Component Value Date   TSH 11.984 (H)  04/08/2021   Lab Results  Component Value Date   WBC 5.0 04/22/2021   HGB 10.3 (L) 04/22/2021   HCT 30.1 (L) 04/22/2021   MCV 102.4 (H) 04/22/2021   PLT 131 (L) 04/22/2021   Lab Results  Component Value Date   NA 126 (L) 04/22/2021   K 4.0 04/22/2021   CO2 24 04/22/2021   GLUCOSE 111 (H) 04/22/2021   BUN 13 04/22/2021   CREATININE 0.46 04/22/2021   BILITOT 0.6 04/22/2021   ALKPHOS  67 04/22/2021   AST 18 04/22/2021   ALT 10 04/22/2021   PROT 6.8 04/22/2021   ALBUMIN 3.9 04/22/2021   CALCIUM 9.2 04/22/2021   ANIONGAP 3 (L) 04/22/2021   No results found for: CHOL No results found for: HDL No results found for: LDLCALC No results found for: TRIG No results found for: CHOLHDL No results found for: HGBA1C    Assessment & Plan:   Problem List Items Addressed This Visit       Endocrine   Malignant neoplasm of ovary ([place]) - Primary    Patient is under chemotherapy      Hypothyroidism due to Hashimoto's thyroiditis    We will increase his levothyroxine to 100 mcg daily      Relevant Medications   levothyroxine (SYNTHROID) 100 MCG tablet     Other   S/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    Dispense:  90 tablet    Refill:  3    Follow-up: No follow-ups on file.    Cletis Athens, MD

## 2021-04-25 NOTE — Assessment & Plan Note (Signed)
Patient is under chemotherapy

## 2021-04-25 NOTE — Assessment & Plan Note (Signed)
We will increase his levothyroxine to 100 mcg daily

## 2021-05-06 ENCOUNTER — Inpatient Hospital Stay: Payer: Medicare Other

## 2021-05-06 ENCOUNTER — Inpatient Hospital Stay (HOSPITAL_BASED_OUTPATIENT_CLINIC_OR_DEPARTMENT_OTHER): Payer: Medicare Other | Admitting: Oncology

## 2021-05-06 ENCOUNTER — Other Ambulatory Visit: Payer: Self-pay

## 2021-05-06 ENCOUNTER — Encounter: Payer: Self-pay | Admitting: Oncology

## 2021-05-06 VITALS — BP 161/79 | HR 70 | Temp 97.6°F | Resp 16 | Wt 119.0 lb

## 2021-05-06 DIAGNOSIS — T451X5A Adverse effect of antineoplastic and immunosuppressive drugs, initial encounter: Secondary | ICD-10-CM

## 2021-05-06 DIAGNOSIS — C569 Malignant neoplasm of unspecified ovary: Secondary | ICD-10-CM

## 2021-05-06 DIAGNOSIS — D701 Agranulocytosis secondary to cancer chemotherapy: Secondary | ICD-10-CM | POA: Diagnosis not present

## 2021-05-06 DIAGNOSIS — E039 Hypothyroidism, unspecified: Secondary | ICD-10-CM

## 2021-05-06 DIAGNOSIS — Z5111 Encounter for antineoplastic chemotherapy: Secondary | ICD-10-CM | POA: Diagnosis not present

## 2021-05-06 DIAGNOSIS — R1900 Intra-abdominal and pelvic swelling, mass and lump, unspecified site: Secondary | ICD-10-CM

## 2021-05-06 DIAGNOSIS — D6481 Anemia due to antineoplastic chemotherapy: Secondary | ICD-10-CM | POA: Diagnosis not present

## 2021-05-06 DIAGNOSIS — C561 Malignant neoplasm of right ovary: Secondary | ICD-10-CM | POA: Diagnosis not present

## 2021-05-06 DIAGNOSIS — Z5112 Encounter for antineoplastic immunotherapy: Secondary | ICD-10-CM | POA: Diagnosis not present

## 2021-05-06 LAB — CBC WITH DIFFERENTIAL/PLATELET
Abs Immature Granulocytes: 0.02 10*3/uL (ref 0.00–0.07)
Basophils Absolute: 0 10*3/uL (ref 0.0–0.1)
Basophils Relative: 1 %
Eosinophils Absolute: 0.1 10*3/uL (ref 0.0–0.5)
Eosinophils Relative: 2 %
HCT: 30.9 % — ABNORMAL LOW (ref 36.0–46.0)
Hemoglobin: 10.3 g/dL — ABNORMAL LOW (ref 12.0–15.0)
Immature Granulocytes: 1 %
Lymphocytes Relative: 21 %
Lymphs Abs: 0.8 10*3/uL (ref 0.7–4.0)
MCH: 35 pg — ABNORMAL HIGH (ref 26.0–34.0)
MCHC: 33.3 g/dL (ref 30.0–36.0)
MCV: 105.1 fL — ABNORMAL HIGH (ref 80.0–100.0)
Monocytes Absolute: 0.5 10*3/uL (ref 0.1–1.0)
Monocytes Relative: 15 %
Neutro Abs: 2.2 10*3/uL (ref 1.7–7.7)
Neutrophils Relative %: 60 %
Platelets: 161 10*3/uL (ref 150–400)
RBC: 2.94 MIL/uL — ABNORMAL LOW (ref 3.87–5.11)
RDW: 15.6 % — ABNORMAL HIGH (ref 11.5–15.5)
WBC: 3.7 10*3/uL — ABNORMAL LOW (ref 4.0–10.5)
nRBC: 0 % (ref 0.0–0.2)

## 2021-05-06 LAB — COMPREHENSIVE METABOLIC PANEL
ALT: 10 U/L (ref 0–44)
AST: 18 U/L (ref 15–41)
Albumin: 4 g/dL (ref 3.5–5.0)
Alkaline Phosphatase: 54 U/L (ref 38–126)
Anion gap: 6 (ref 5–15)
BUN: 13 mg/dL (ref 8–23)
CO2: 24 mmol/L (ref 22–32)
Calcium: 9.5 mg/dL (ref 8.9–10.3)
Chloride: 99 mmol/L (ref 98–111)
Creatinine, Ser: 0.51 mg/dL (ref 0.44–1.00)
GFR, Estimated: >60 mL/min (ref >60)
Glucose, Bld: 88 mg/dL (ref 70–99)
Potassium: 4 mmol/L (ref 3.5–5.1)
Sodium: 129 mmol/L — ABNORMAL LOW (ref 135–145)
Total Bilirubin: 0.8 mg/dL (ref 0.3–1.2)
Total Protein: 7.1 g/dL (ref 6.5–8.1)

## 2021-05-06 LAB — PROTEIN, URINE, RANDOM: Total Protein, Urine: 25 mg/dL

## 2021-05-06 MED ORDER — DEXAMETHASONE SODIUM PHOSPHATE 10 MG/ML IJ SOLN
5.0000 mg | Freq: Once | INTRAMUSCULAR | Status: AC
  Administered 2021-05-06: 5 mg via INTRAVENOUS
  Filled 2021-05-06: qty 1

## 2021-05-06 MED ORDER — DEXTROSE 5 % IV SOLN
Freq: Once | INTRAVENOUS | Status: AC
  Filled 2021-05-06: qty 250

## 2021-05-06 MED ORDER — PEGFILGRASTIM 6 MG/0.6ML ~~LOC~~ PSKT
6.0000 mg | PREFILLED_SYRINGE | Freq: Once | SUBCUTANEOUS | Status: AC
  Administered 2021-05-06: 6 mg via SUBCUTANEOUS
  Filled 2021-05-06: qty 0.6

## 2021-05-06 MED ORDER — DOXORUBICIN HCL LIPOSOMAL CHEMO INJECTION 2 MG/ML
40.0000 mg/m2 | Freq: Once | INTRAVENOUS | Status: AC
  Administered 2021-05-06: 64 mg via INTRAVENOUS
  Filled 2021-05-06: qty 10

## 2021-05-06 MED ORDER — SODIUM CHLORIDE 0.9 % IV SOLN: 5.0000 mg | Freq: Once | INTRAVENOUS | Status: DC

## 2021-05-06 MED ORDER — SODIUM CHLORIDE 0.9 % IV SOLN
10.0000 mg/kg | Freq: Once | INTRAVENOUS | Status: AC
  Administered 2021-05-06: 500 mg via INTRAVENOUS
  Filled 2021-05-06: qty 16

## 2021-05-06 MED ORDER — SODIUM CHLORIDE 0.9 % IV SOLN
Freq: Once | INTRAVENOUS | Status: AC
  Filled 2021-05-06: qty 250

## 2021-05-06 MED ORDER — HEPARIN SOD (PORK) LOCK FLUSH 100 UNIT/ML IV SOLN
500.0000 [IU] | Freq: Once | INTRAVENOUS | Status: AC | PRN
  Administered 2021-05-06: 500 [IU]
  Filled 2021-05-06: qty 5

## 2021-05-06 NOTE — Progress Notes (Signed)
Pt in for follow up and treatment today.  Denies any concerns.  

## 2021-05-06 NOTE — Patient Instructions (Signed)
[facility]  Discharge Instructions: °Thank you for choosing [facility] to provide your oncology and hematology care.  °If you have a lab appointment with the Cancer Center, please go directly to the Cancer Center and check in at the registration area. ° °Wear comfortable clothing and clothing appropriate for easy access to any Portacath or PICC line.  ° °We strive to give you quality time with your provider. You may need to reschedule your appointment if you arrive late (15 or more minutes).  Arriving late affects you and other patients whose appointments are after yours.  Also, if you miss three or more appointments without notifying the office, you may be dismissed from the clinic at the provider’s discretion.    °  °For prescription refill requests, have your pharmacy contact our office and allow 72 hours for refills to be completed.   ° °Today you received the following chemotherapy and/or immunotherapy agents: Zirabev, Doxil    °  °To help prevent nausea and vomiting after your treatment, we encourage you to take your nausea medication as directed. ° °BELOW ARE SYMPTOMS THAT SHOULD BE REPORTED IMMEDIATELY: °*FEVER GREATER THAN 100.4 F (38 °C) OR HIGHER °*CHILLS OR SWEATING °*NAUSEA AND VOMITING THAT IS NOT CONTROLLED WITH YOUR NAUSEA MEDICATION °*UNUSUAL SHORTNESS OF BREATH °*UNUSUAL BRUISING OR BLEEDING °*URINARY PROBLEMS (pain or burning when urinating, or frequent urination) °*BOWEL PROBLEMS (unusual diarrhea, constipation, pain near the anus) °TENDERNESS IN MOUTH AND THROAT WITH OR WITHOUT PRESENCE OF ULCERS (sore throat, sores in mouth, or a toothache) °UNUSUAL RASH, SWELLING OR PAIN  °UNUSUAL VAGINAL DISCHARGE OR ITCHING  ° °Items with * indicate a potential emergency and should be followed up as soon as possible or go to the Emergency Department if any problems should occur. ° °Please show the CHEMOTHERAPY ALERT CARD or IMMUNOTHERAPY ALERT CARD at  check-in to the Emergency Department and triage nurse. ° °Should you have questions after your visit or need to cancel or reschedule your appointment, please contact [facility]  [phone] and follow the prompts.  Office hours are 8:00 a.m. to 4:30 p.m. Monday - Friday. Please note that voicemails left after 4:00 p.m. may not be returned until the following business day.  We are closed weekends and major holidays. You have access to a nurse at all times for urgent questions. Please call the main number to the clinic [phone] and follow the prompts. ° °For any non-urgent questions, you may also contact your provider using MyChart. We now offer e-Visits for anyone 18 and older to request care online for non-urgent symptoms. For details visit [email or web address]. °  °Also download the MyChart app! Go to the app store, search "MyChart", open the app, select [facility], and log in with your MyChart username and password. ° °Due to Covid, a mask is required upon entering the hospital/clinic. If you do not have a mask, one will be given to you upon arrival. For doctor visits, patients may have 1 support person aged 18 or older with them. For treatment visits, patients cannot have anyone with them due to current Covid guidelines and our immunocompromised population. Bevacizumab injection °What is this medication? °BEVACIZUMAB (be va SIZ yoo mab) is a monoclonal antibody. It is used to treat many types of cancer. °This medicine may be used for other purposes; ask your health care provider or pharmacist if you have questions. °COMMON BRAND NAME(S): Alymsys, Avastin, MVASI, Zirabev °What should I tell my   care team before I take this medication? They need to know if you have any of these conditions: diabetes heart disease high blood pressure history of coughing up blood prior anthracycline chemotherapy (e.g., doxorubicin, daunorubicin, epirubicin) recent or ongoing radiation  therapy recent or planning to have surgery stroke an unusual or allergic reaction to bevacizumab, hamster proteins, mouse proteins, other medicines, foods, dyes, or preservatives pregnant or trying to get pregnant breast-feeding How should I use this medication? This medicine is for infusion into a vein. It is given by a health care professional in a hospital or clinic setting. Talk to your pediatrician regarding the use of this medicine in children. Special care may be needed. Overdosage: If you think you have taken too much of this medicine contact a poison control center or emergency room at once. NOTE: This medicine is only for you. Do not share this medicine with others. What if I miss a dose? It is important not to miss your dose. Call your doctor or health care professional if you are unable to keep an appointment. What may interact with this medication? Interactions are not expected. This list may not describe all possible interactions. Give your health care provider a list of all the medicines, herbs, non-prescription drugs, or dietary supplements you use. Also tell them if you smoke, drink alcohol, or use illegal drugs. Some items may interact with your medicine. What should I watch for while using this medication? Your condition will be monitored carefully while you are receiving this medicine. You will need important blood work and urine testing done while you are taking this medicine. This medicine may increase your risk to bruise or bleed. Call your doctor or health care professional if you notice any unusual bleeding. Before having surgery, talk to your health care provider to make sure it is ok. This drug can increase the risk of poor healing of your surgical site or wound. You will need to stop this drug for 28 days before surgery. After surgery, wait at least 28 days before restarting this drug. Make sure the surgical site or wound is healed enough before restarting this drug.  Talk to your health care provider if questions. Do not become pregnant while taking this medicine or for 6 months after stopping it. Women should inform their doctor if they wish to become pregnant or think they might be pregnant. There is a potential for serious side effects to an unborn child. Talk to your health care professional or pharmacist for more information. Do not breast-feed an infant while taking this medicine and for 6 months after the last dose. This medicine has caused ovarian failure in some women. This medicine may interfere with the ability to have a child. You should talk to your doctor or health care professional if you are concerned about your fertility. What side effects may I notice from receiving this medication? Side effects that you should report to your doctor or health care professional as soon as possible: allergic reactions like skin rash, itching or hives, swelling of the face, lips, or tongue chest pain or chest tightness chills coughing up blood high fever seizures severe constipation signs and symptoms of bleeding such as bloody or black, tarry stools; red or dark-brown urine; spitting up blood or brown material that looks like coffee grounds; red spots on the skin; unusual bruising or bleeding from the eye, gums, or nose signs and symptoms of a blood clot such as breathing problems; chest pain; severe, sudden headache; pain, swelling,  warmth in the leg °signs and symptoms of a stroke like changes in vision; confusion; trouble speaking or understanding; severe headaches; sudden numbness or weakness of the face, arm or leg; trouble walking; dizziness; loss of balance or coordination °stomach pain °sweating °swelling of legs or ankles °vomiting °weight gain °Side effects that usually do not require medical attention (report to your doctor or health care professional if they continue or are bothersome): °back pain °changes in taste °decreased appetite °dry  skin °nausea °tiredness °This list may not describe all possible side effects. Call your doctor for medical advice about side effects. You may report side effects to FDA at 1-800-FDA-1088. °Where should I keep my medication? °This drug is given in a hospital or clinic and will not be stored at home. °NOTE: This sheet is a summary. It may not cover all possible information. If you have questions about this medicine, talk to your doctor, pharmacist, or health care provider. °© 2022 [employer] Standard (2020-12-14 00:00:00) °Doxorubicin Liposomal injection °What is this medication? °LIPOSOMAL DOXORUBICIN (LIP oh som al  dox oh ROO bi sin) is a chemotherapy drug. This medicine is used to treat many kinds of cancer like Kaposi's sarcoma, multiple myeloma, and ovarian cancer. °This medicine may be used for other purposes; ask your health care provider or pharmacist if you have questions. °COMMON BRAND NAME(S): Doxil, Lipodox °What should I tell my care team before I take this medication? °They need to know if you have any of these conditions: °blood disorders °heart disease °infection (especially a virus infection such as chickenpox, cold sores, or herpes) °liver disease °recent or ongoing radiation therapy °an unusual or allergic reaction to doxorubicin, other chemotherapy agents, soybeans, other medicines, foods, dyes, or preservatives °pregnant or trying to get pregnant °breast-feeding °How should I use this medication? °This drug is given as an infusion into a vein. It is administered in a hospital or clinic by a specially trained health care professional. If you have pain, swelling, burning or any unusual feeling around the site of your injection, tell your health care professional right away. °Talk to your pediatrician regarding the use of this medicine in children. Special care may be needed. °Overdosage: If you think you have taken too much of this medicine contact a poison control center or emergency room at  once. °NOTE: This medicine is only for you. Do not share this medicine with others. °What if I miss a dose? °It is important not to miss your dose. Call your doctor or health care professional if you are unable to keep an appointment. °What may interact with this medication? °Do not take this medicine with any of the following medications: °zidovudine °This medicine may also interact with the following medications: °medicines to increase blood counts like filgrastim, pegfilgrastim, sargramostim °vaccines °Talk to your doctor or health care professional before taking any of these medicines: °acetaminophen °aspirin °ibuprofen °ketoprofen °naproxen °This list may not describe all possible interactions. Give your health care provider a list of all the medicines, herbs, non-prescription drugs, or dietary supplements you use. Also tell them if you smoke, drink alcohol, or use illegal drugs. Some items may interact with your medicine. °What should I watch for while using this medication? °Your condition will be monitored carefully while you are receiving this medicine. You may need blood work done while you are taking this medicine. °This drug may make you feel generally unwell. This is not uncommon, as chemotherapy can affect healthy cells as well as cancer cells. Report   any side effects. Continue your course of treatment even though you feel ill unless your doctor tells you to stop. Your urine may turn orange-red for a few days after your dose. This is not blood. If your urine is dark or brown, call your doctor. In some cases, you may be given additional medicines to help with side effects. Follow all directions for their use. Talk to your doctor about your risk of cancer. You may be more at risk for certain types of cancers if you take this medicine. Do not become pregnant while taking this medicine or for 6 months after stopping it. Women should inform their healthcare professional if they wish to become pregnant  or think they may be pregnant. Men should not father a child while taking this medicine and for 6 months after stopping it. There is a potential for serious side effects to an unborn child. Talk to your health care professional or pharmacist for more information. Do not breast-feed an infant while taking this medicine. This medicine has caused ovarian failure in some women. This medicine may make it more difficult to get pregnant. Talk to your healthcare professional if you are concerned about your fertility. This medicine has caused decreased sperm counts in some men. This may make it more difficult to father a child. Talk to your healthcare professional if you are concerned about your fertility. This medicine may cause a decrease in Co-Enzyme Q-10. You should make sure that you get enough Co-Enzyme Q-10 while you are taking this medicine. Discuss the foods you eat and the vitamins you take with your health care professional. What side effects may I notice from receiving this medication? Side effects that you should report to your doctor or health care professional as soon as possible: allergic reactions like skin rash, itching or hives, swelling of the face, lips, or tongue low blood counts - this medicine may decrease the number of white blood cells, red blood cells and platelets. You may be at increased risk for infections and bleeding. signs of hand-foot syndrome - tingling or burning, redness, flaking, swelling, small blisters, or small sores on the palms of your hands or the soles of your feet signs of infection - fever or chills, cough, sore throat, pain or difficulty passing urine signs of decreased platelets or bleeding - bruising, pinpoint red spots on the skin, black, tarry stools, blood in the urine signs of decreased red blood cells - unusually weak or tired, fainting spells, lightheadedness back pain, chills, facial flushing, fever, headache, tightness in the chest or throat during the  infusion breathing problems chest pain fast, irregular heartbeat mouth pain, redness, sores pain, swelling, redness at site where injected pain, tingling, numbness in the hands or feet swelling of ankles, feet, or hands vomiting Side effects that usually do not require medical attention (report to your doctor or health care professional if they continue or are bothersome): diarrhea hair loss loss of appetite nail discoloration or damage nausea red or watery eyes red colored urine stomach upset This list may not describe all possible side effects. Call your doctor for medical advice about side effects. You may report side effects to FDA at 1-800-FDA-1088. Where should I keep my medication? This drug is given in a hospital or clinic and will not be stored at home. NOTE: This sheet is a summary. It may not cover all possible information. If you have questions about this medicine, talk to your doctor, pharmacist, or health care provider.  2022 [employer]/Gold  Standard (2017-12-05 00:00:00) ° °

## 2021-05-06 NOTE — Progress Notes (Signed)
Hematology/Oncology Progress  note [phone]      Patient Care Team: Cletis Athens, MD as PCP - General (Internal Medicine) Clent Jacks, [name], [name], [name], MD as Referring Physician (Obstetrics and Gynecology)  CHIEF COMPLAINTS/PURPOSE OF Visit Follow up for chemotherapy for ovarian cancer.  HISTORY OF PRESENTING ILLNESS: Katie Hill 71 y.o. female presents for follow up of management of stage IIIC ovarian cancer. She underwent  Neoadjuvant chemotherapy of carbo and taxol x 4  # Patient had debulking surgery on 03/14/2017. She had a laparoscopy with conversion to laparotomy, total hysterectomy, with bilateral salpingo oophorectomy, right aortic lymph node dissection, omentectomy. Pathology showed small foci of residual disease in ovary.   Genetic testing negative for 83 genes on [facility]'s Multi-Cancer panel (ALK, APC, ATM, AXIN2, BAP1, BARD1, BLM, BMPR1A, BRCA1, BRCA2, BRIP1, CASR, CDC73, CDH1, CDK4, CDKN1B, CDKN1C, CDKN2A, CEBPA, CHEK2, CTNNA1, DICER1, DIS3L2, EGFR, EPCAM, FH, FLCN, GATA2, GPC3, GREM1, HOXB13, HRAS, KIT, MAX, MEN1, MET, MITF, MLH1, MSH2, MSH3, MSH6, MUTYH, NBN, NF1, NF2, NTHL1, PALB2, PDGFRA, PHOX2B, PMS2, POLD1, POLE, POT1, PRKAR1A, PTCH1, PTEN, RAD50, RAD51C, RAD51D, RB1, RECQL4, RET, RUNX1, SDHA, SDHAF2, SDHB, SDHC, SDHD, SMAD4, SMARCA4, SMARCB1, SMARCE1, STK11, SUFU, TERC, TERT, TMEM127, TP53, TSC1, TSC2, VHL, WRN, WT1).   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  Myraid testing negative for somatic BRACA1/2, positive for HRD.   # HRD positive, was referred to [facility] for clinical trials of Olarparib maintenance. She opted out.  #  Treatment:  Stage IIIC Ovarian cancer:  #s/p Carboplatin and taxol x 4 neoadjuvant chemotherapy followed by debulking surgery  03/14/2017 # 03/28/2017 S/p Adjuvant carbo and taxol x3   Local recurrence, CA125 one 46.3 03/15/2018-05/17/2018 Carboplatin and Taxol x 4. CA125 decrease from 49.7 to 6 after 4 cycles of treatment.  Started on Olarparib 342m BID on 05/17/2018.  # was seen by Dr. BFransisca Connorsfor further evaluation due to rising Ca1 25 and MRI abdomenPelvis on 11/05/2018 showed Interval progression of, now measuring 2.7 x 2.3 cm and concerning for progression of metastatic disease.retrocaval lymph node in the abdomen Olaparib was held for short period of time. Her CA125 trended down again.  Dr. BFransisca Connorsrecommending resume olaparib and continue monitor. If she has more significant progression of disease she may be a candidate for clinical trial or will be treated with other chemotherapy drugs including Doxil, gemcitabine and Avastin.  #10/06/2020, CT chest abdomen pelvis with contrast showed new lymph nodes in the prevascular space of the upper anterior mediastinum most consistent with metastasis.  Single new right lower lobe pulmonary nodule is concerning for early pulmonary metastasis.  Interval enlargement of the left periaortic retroperitoneal lymph node.  Stable adjacent aorto caval adenopathy.  10/29/2020 MUGA LVEF normal 10/29/2020 Stopped Olarparib  11/03/2020 start Doxil + bevacizumab 01/14/21 Echocardiogram -LVEF 55-60%  INTERVAL HISTORY 71y.o. female with above oncology history reviewed by me today presents for evaluation for chemotherapy for treatment of ovarian cancer  Patient was accompanied by her husband.   Patient reports feeling well today.  No new problems since last visit.  Denies nausea vomiting diarrhea. She takes Norvasc 2.5 mg daily.   . Review of Systems  Constitutional:  Negative for appetite change, chills, fatigue and fever.  HENT:   Negative for hearing loss and voice change.   Eyes:  Negative for eye problems.  Respiratory:  Negative  for chest tightness and cough.   °Cardiovascular:   Negative for chest pain.  °Gastrointestinal:  Negative for abdominal distention, abdominal pain and blood in stool.  °Endocrine: Negative for hot flashes.  °Genitourinary:  Negative for difficulty urinating and frequency.   °Musculoskeletal:  Negative for arthralgias.  °Skin:  Negative for itching and rash.  °Neurological:  Negative for extremity weakness.  °Hematological:  Negative for adenopathy.  °Psychiatric/Behavioral:  Negative for confusion.   ° ° °. °MEDICAL HISTORY: °Past Medical History:  °Diagnosis Date  ° Dysrhythmia   ° Genetic testing 03/28/2017  ° Multi-Cancer panel (83 genes) @ [facility] - No pathogenic mutations detected  ° High grade ovarian cancer (HCC) 11/20/2016  ° Pelvic mass in female   ° ° °SURGICAL HISTORY: °Past Surgical History:  °Procedure Laterality Date  ° APPENDECTOMY    ° LAPAROSCOPY N/A 03/14/2017  ° Procedure: LAPAROSCOPY OPERATIVE;  Surgeon: Berchuck, Andrew, MD;  Location: [facility] ORS;  Service: Gynecology;  Laterality: N/A;  ° LAPAROTOMY N/A 03/14/2017  ° Procedure: LAPAROTOMY;  Surgeon: Berchuck, Andrew, MD;  Location: [facility] ORS;  Service: Gynecology;  Laterality: N/A;  ° LYMPH NODE DISSECTION N/A 03/14/2017  ° Procedure: LYMPH NODE DISSECTION;  Surgeon: Berchuck, Andrew, MD;  Location: [facility] ORS;  Service: Gynecology;  Laterality: N/A;  ° OMENTECTOMY N/A 03/14/2017  ° Procedure: OMENTECTOMY;  Surgeon: Berchuck, Andrew, MD;  Location: [facility] ORS;  Service: Gynecology;  Laterality: N/A;  ° PORTA CATH INSERTION N/A 11/27/2016  ° Procedure: Porta Cath Insertion;  Surgeon: Dew, Jason S, MD;  Location: [facility] INV[facility]IVE [facility];  Service: Cardiovascular;  Laterality: N/A;  ° ° °SOCIAL HISTORY: °Social History  ° °Tobacco Use  ° Smoking status: Never  ° Smokeless tobacco: Never  °Vaping Use  ° Vaping Use: Never used  °Substance Use Topics  ° Alcohol use: Not Currently  ° Drug use: No  ° ° ° °FAMILY HISTORY °Family History  °Problem Relation Age of Onset  ° Throat cancer Cousin   ° Throat cancer Cousin    ° Leukemia Cousin   ° ° °ALLERGIES:  is allergic to omeprazole. ° °MEDICATIONS:  °Current Outpatient Medications  °Medication Sig Dispense Refill  ° amLODipine (NORVASC) 2.5 MG tablet Take 1 tablet (2.5 mg total) by mouth daily. 30 tablet 4  ° hydrocortisone cream 0.5 % Apply 1 application topically 3 (three) times daily. 30 g 0  ° levothyroxine (SYNTHROID) 100 MCG tablet Take 1 tablet (100 mcg total) by mouth daily. 90 tablet 3  ° lidocaine-prilocaine (EMLA) cream Apply 1 application topically as needed. Apply small amount to port site at least 1 hour prior to it being accessed, cover with plastic wrap 30 g 1  ° ondansetron (ZOFRAN) 8 MG tablet Take 1 tablet (8 mg total) by mouth every 8 (eight) hours as needed for nausea or vomiting. 60 tablet 1  ° °No current facility-administered medications for this visit.  ° ° °PHYSICAL EXAMINATION: ° °ECOG PERFORMANCE STATUS: 0 - Asymptomatic °Vitals:  ° 05/06/21 0831  °BP: (!) 161/79  °Pulse: 70  °Resp: 16  °Temp: 97.6 °F (36.4 °C)  °SpO2: 100%  ° ° °Filed Weights  ° 05/06/21 0831  °Weight: 119 lb (54 kg)  ° ° ° °Physical Exam °Constitutional:   °   General: She is not in acute distress. °   Appearance: She is not diaphoretic.  °   Comments: Thin built, she walks independantly  °HENT:  °   Head: Normocephalic and atraumatic.  °   Nose:   Nose normal.     Mouth/Throat:     Pharynx: No oropharyngeal exudate.  Eyes:     General: No scleral icterus.    Pupils: Pupils are equal, round, and reactive to light.  Neck:     Vascular: No JVD.  Cardiovascular:     Rate and Rhythm: Normal rate and regular rhythm.     Heart sounds: No murmur heard. Pulmonary:     Effort: Pulmonary effort is normal. No respiratory distress.     Breath sounds: Normal breath sounds. No rales.  Chest:     Chest wall: No tenderness.  Abdominal:     General: Bowel sounds are normal. There is no distension.     Palpations: Abdomen is soft.     Tenderness: There is no abdominal tenderness.   Musculoskeletal:        General: Normal range of motion.     Cervical back: Normal range of motion and neck supple.  Lymphadenopathy:     Cervical: No cervical adenopathy.  Skin:    General: Skin is warm and dry.     Findings: No erythema or rash.     Comments: Right anterior medi port +   Neurological:     Mental Status: She is alert and oriented to person, place, and time.     Cranial Nerves: No cranial nerve deficit.     Motor: No abnormal muscle tone.     Coordination: Coordination normal.  Psychiatric:        Mood and Affect: Affect normal.        Judgment: Judgment normal.       LABORATORY DATA: I have personally reviewed the data as listed: CBC    Component Value Date/Time   WBC 5.0 04/22/2021 0855   RBC 2.94 (L) 04/22/2021 0855   HGB 10.3 (L) 04/22/2021 0855   HCT 30.1 (L) 04/22/2021 0855   PLT 131 (L) 04/22/2021 0855   MCV 102.4 (H) 04/22/2021 0855   MCH 35.0 (H) 04/22/2021 0855   MCHC 34.2 04/22/2021 0855   RDW 15.2 04/22/2021 0855   LYMPHSABS 0.7 04/22/2021 0855   MONOABS 0.3 04/22/2021 0855   EOSABS 0.1 04/22/2021 0855   BASOSABS 0.1 04/22/2021 0855   CMP Latest Ref Rng & Units 04/22/2021 04/08/2021 03/25/2021  Glucose 70 - 99 mg/dL 111(H) 110(H) 90  BUN 8 - 23 mg/dL _0 Creatinine 0.44 - 1.00 mg/dL 0.46 0.55 0.50  Sodium 135 - 145 mmol/L 126(L) 129(L) 127(L)  Potassium 3.5 - 5.1 mmol/L 4.0 4.0 4.2  Chloride 98 - 111 mmol/L 99 99 98  CO2 22 - 32 mmol/L _1 Calcium 8.9 - 10.3 mg/dL 9.2 9.4 9.4  Total Protein 6.5 - 8.1 g/dL 6.8 7.0 6.9  Total Bilirubin 0.3 - 1.2 mg/dL 0.6 0.5 0.5  Alkaline Phos 38 - 126 U/L 67 51 61  AST 15 - 41 U/L _2 ALT 0 - 44 U/L _3 Pathology 11/16/2016 Surgical Pathology  CASE: [id number]  PATIENT: Krystina Gillies  Surgical Pathology Report   SPECIMEN SUBMITTED:  A. Retroperitoneal adenopathy, left  DIAGNOSIS:  A. LYMPH NODE, LEFT RETROPERITONEAL; CT-GUIDED CORE BIOPSY:  - METASTATIC  HIGH-GRADE SEROUS CARCINOMA.  Pathology 03/14/2017   DIAGNOSIS:  A. OMENTUM; OMENTECTOMY:  - NO TUMOR SEEN.  - ONE NEGATIVE LYMPH NODE (0/1).   B.  RIGHT FALLOPIAN TUBE AND OVARY; SALPINGO-OOPHORECTOMY:  - SMALL FOCI OF HIGH GRADE SEROUS CARCINOMA INVOLVING THE OVARY.  -  MARKED THERAPY RELATED CHANGE.  °- NO TUMOR SEEN IN THE FALLOPIAN TUBE.  ° °C.  UTERUS, CERVIX, LEFT FALLOPIAN TUBE AND OVARY; HYSTERECTOMY AND LEFT  °SALPINGO-OOPHORECTOMY:  °- NABOTHIAN CYSTS.  °- CYSTIC ATROPHY OF THE ENDOMETRIUM.  °- SEROSAL ADHESIONS.  °- UNREMARKABLE FALLOPIAN TUBE.  °- OVARY SHOWING TREATMENT RELATED CHANGE.  ° °D.  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  ° ° °RADIOGRAPHIC STUDIES: °I have personally reviewed the radiological images as listed and agreed with the findings in the report. °No results found. ° ° °ASSESSMENT/PLAN ° Cancer Staging  °Malignant neoplasm of ovary (HCC) °Staging form: Ovary, Fallopian Tube, and Primary Peritoneal Carcinoma, AJCC 8th Edition °- Clinical stage from 11/22/2016: Stage IIIC (cT3c, cN1b, cM0) - Signed by Yu, Zhou, MD on 11/22/2016 °Stage used in treatment planning: Yes °National guidelines used in treatment planning: Yes °Type of national guideline used in treatment planning: NCCN ° °1. High grade ovarian cancer (HCC)   °2. Encounter for antineoplastic chemotherapy   °3. Anemia associated with chemotherapy   °4. Chemotherapy induced neutropenia (HCC)   °5. Acquired hypothyroidism   °: °# Ovarian Cancer, local recurrence.  °Currently on olaparib 300 mg twice daily. She tolerates well.  °CA 125 18.7-->61.2-->111-->90.5-->69--> 54.8--> 29.1-->47-->71.2-->67.7->73.2-->82.8-->70.4-->66.4--> 58.4--> 64.9--> 56.9--> 42.2-->47.7-->54.4-->68.2-> 97-->168-->70.3--> 57.4--> 49.4->33.7-->32 °01/26/2021, CT chest abdomen pelvis-partial response-decrease of lung nodule.  Unchanged lymphadenopathy. °Labs reviewed and  discussed with patient °Proceed with cycle 7 Doxil and bevacizumab today.  On pro Neulasta support.- °No need for 2 weeks just elaborated by [facility] and medical care chat yet on prone to 4 weeks. °She will return in 2 weeks with labs and bevacizumab treatments. ° ° °#Hypertension, likely secondary to bevacizumab.   °Continue Norvasc 2.5 mg daily. °Patient measures her blood pressure at home and appears to have a normal reading during her BP readings in the clinic.  Continue to monitor. ° ° °Chronic anemia, chemotherapy-induced.   °Hemoglobin is 9.9 today, monitor. °Chronic macrocytosis with adequate B12 and folate ° °#hypothyroidism, Synthroid was increased by PCP to 100 MCG daily.   °I would defer further management to primary care provider ° ° °Hyponatremia, likely secondary to hypothyroidism.  Sodium level has improved to 129.   ° °Port-A-Cath in place °Patient declined influenza vaccination. ° °RTF 2 weeks for lab  bevacizumab. °4 weeks for lab Doxil and bevacizumab. ° ° ° °Zhou Yu, MD, PhD °05/06/21 °

## 2021-05-07 LAB — CA 125: Cancer Antigen (CA) 125: 23.3 U/mL (ref 0.0–38.1)

## 2021-05-16 ENCOUNTER — Other Ambulatory Visit: Payer: Self-pay

## 2021-05-16 ENCOUNTER — Ambulatory Visit
Admission: RE | Admit: 2021-05-16 | Discharge: 2021-05-16 | Disposition: A | Discharge status: Home / Self Care | Payer: Medicare Other | Source: Ambulatory Visit | Attending: Oncology | Admitting: Oncology

## 2021-05-16 DIAGNOSIS — I700 Atherosclerosis of aorta: Secondary | ICD-10-CM | POA: Diagnosis not present

## 2021-05-16 DIAGNOSIS — Z90710 Acquired absence of both cervix and uterus: Secondary | ICD-10-CM | POA: Diagnosis not present

## 2021-05-16 DIAGNOSIS — R599 Enlarged lymph nodes, unspecified: Secondary | ICD-10-CM | POA: Diagnosis not present

## 2021-05-16 DIAGNOSIS — R918 Other nonspecific abnormal finding of lung field: Secondary | ICD-10-CM | POA: Diagnosis not present

## 2021-05-16 DIAGNOSIS — R1900 Intra-abdominal and pelvic swelling, mass and lump, unspecified site: Secondary | ICD-10-CM | POA: Diagnosis not present

## 2021-05-16 DIAGNOSIS — Z8543 Personal history of malignant neoplasm of ovary: Secondary | ICD-10-CM | POA: Diagnosis not present

## 2021-05-16 MED ORDER — IOHEXOL 300 MG/ML  SOLN
100.0000 mL | Freq: Once | INTRAMUSCULAR | Status: AC | PRN
  Administered 2021-05-16: 100 mL via INTRAVENOUS

## 2021-05-20 ENCOUNTER — Inpatient Hospital Stay: Payer: Medicare Other

## 2021-05-20 ENCOUNTER — Inpatient Hospital Stay: Payer: Medicare Other | Attending: Oncology

## 2021-05-20 ENCOUNTER — Other Ambulatory Visit: Payer: Self-pay

## 2021-05-20 ENCOUNTER — Other Ambulatory Visit: Payer: Medicare Other

## 2021-05-20 VITALS — BP 157/85 | HR 81 | Temp 99.2°F | Resp 18 | Wt 117.4 lb

## 2021-05-20 DIAGNOSIS — D701 Agranulocytosis secondary to cancer chemotherapy: Secondary | ICD-10-CM | POA: Diagnosis not present

## 2021-05-20 DIAGNOSIS — E871 Hypo-osmolality and hyponatremia: Secondary | ICD-10-CM | POA: Diagnosis not present

## 2021-05-20 DIAGNOSIS — C561 Malignant neoplasm of right ovary: Secondary | ICD-10-CM | POA: Diagnosis not present

## 2021-05-20 DIAGNOSIS — Z5111 Encounter for antineoplastic chemotherapy: Secondary | ICD-10-CM | POA: Diagnosis not present

## 2021-05-20 DIAGNOSIS — D6481 Anemia due to antineoplastic chemotherapy: Secondary | ICD-10-CM | POA: Diagnosis not present

## 2021-05-20 DIAGNOSIS — C569 Malignant neoplasm of unspecified ovary: Secondary | ICD-10-CM

## 2021-05-20 DIAGNOSIS — Z5112 Encounter for antineoplastic immunotherapy: Secondary | ICD-10-CM | POA: Diagnosis not present

## 2021-05-20 DIAGNOSIS — R809 Proteinuria, unspecified: Secondary | ICD-10-CM | POA: Insufficient documentation

## 2021-05-20 DIAGNOSIS — I10 Essential (primary) hypertension: Secondary | ICD-10-CM | POA: Diagnosis not present

## 2021-05-20 DIAGNOSIS — E039 Hypothyroidism, unspecified: Secondary | ICD-10-CM | POA: Insufficient documentation

## 2021-05-20 LAB — CBC WITH DIFFERENTIAL/PLATELET
Abs Immature Granulocytes: 0.08 10*3/uL — ABNORMAL HIGH (ref 0.00–0.07)
Basophils Absolute: 0.1 10*3/uL (ref 0.0–0.1)
Basophils Relative: 1 %
Eosinophils Absolute: 0.2 10*3/uL (ref 0.0–0.5)
Eosinophils Relative: 3 %
HCT: 30.2 % — ABNORMAL LOW (ref 36.0–46.0)
Hemoglobin: 10.1 g/dL — ABNORMAL LOW (ref 12.0–15.0)
Immature Granulocytes: 2 %
Lymphocytes Relative: 16 %
Lymphs Abs: 0.8 10*3/uL (ref 0.7–4.0)
MCH: 34.9 pg — ABNORMAL HIGH (ref 26.0–34.0)
MCHC: 33.4 g/dL (ref 30.0–36.0)
MCV: 104.5 fL — ABNORMAL HIGH (ref 80.0–100.0)
Monocytes Absolute: 0.4 10*3/uL (ref 0.1–1.0)
Monocytes Relative: 7 %
Neutro Abs: 3.6 10*3/uL (ref 1.7–7.7)
Neutrophils Relative %: 71 %
Platelets: 136 10*3/uL — ABNORMAL LOW (ref 150–400)
RBC: 2.89 MIL/uL — ABNORMAL LOW (ref 3.87–5.11)
RDW: 16 % — ABNORMAL HIGH (ref 11.5–15.5)
WBC: 5.2 10*3/uL (ref 4.0–10.5)
nRBC: 0 % (ref 0.0–0.2)

## 2021-05-20 LAB — COMPREHENSIVE METABOLIC PANEL
ALT: 10 U/L (ref 0–44)
AST: 21 U/L (ref 15–41)
Albumin: 4 g/dL (ref 3.5–5.0)
Alkaline Phosphatase: 70 U/L (ref 38–126)
Anion gap: 9 (ref 5–15)
BUN: 13 mg/dL (ref 8–23)
CO2: 24 mmol/L (ref 22–32)
Calcium: 9.8 mg/dL (ref 8.9–10.3)
Chloride: 100 mmol/L (ref 98–111)
Creatinine, Ser: 0.69 mg/dL (ref 0.44–1.00)
GFR, Estimated: >60 mL/min (ref >60)
Glucose, Bld: 105 mg/dL — ABNORMAL HIGH (ref 70–99)
Potassium: 3.8 mmol/L (ref 3.5–5.1)
Sodium: 133 mmol/L — ABNORMAL LOW (ref 135–145)
Total Bilirubin: 0.2 mg/dL — ABNORMAL LOW (ref 0.3–1.2)
Total Protein: 7.1 g/dL (ref 6.5–8.1)

## 2021-05-20 LAB — PROTEIN, URINE, RANDOM: Total Protein, Urine: 67 mg/dL

## 2021-05-20 MED ORDER — SODIUM CHLORIDE 0.9 % IV SOLN
10.0000 mg/kg | Freq: Once | INTRAVENOUS | Status: AC
  Administered 2021-05-20: 500 mg via INTRAVENOUS
  Filled 2021-05-20: qty 4

## 2021-05-20 MED ORDER — SODIUM CHLORIDE 0.9 % IV SOLN
Freq: Once | INTRAVENOUS | Status: AC
  Filled 2021-05-20: qty 250

## 2021-05-20 MED ORDER — HEPARIN SOD (PORK) LOCK FLUSH 100 UNIT/ML IV SOLN
500.0000 [IU] | Freq: Once | INTRAVENOUS | Status: AC | PRN
  Administered 2021-05-20: 500 [IU]
  Filled 2021-05-20: qty 5

## 2021-05-20 NOTE — Progress Notes (Signed)
Ok to proceed today a nd use Urine protein from 05/02/21 per Dr Tasia Catchings

## 2021-05-20 NOTE — Patient Instructions (Signed)
[facility] AT [place]  Discharge Instructions: Thank you for choosing [facility] to provide your oncology and hematology care.  If you have a lab appointment with the [facility], please go directly to the [facility] and check in at the registration area.  Wear comfortable clothing and clothing appropriate for easy access to any Portacath or PICC line.   We strive to give you quality time with your provider. You may need to reschedule your appointment if you arrive late (15 or more minutes).  Arriving late affects you and other patients whose appointments are after yours.  Also, if you miss three or more appointments without notifying the office, you may be dismissed from the clinic at the providers discretion.      For prescription refill requests, have your pharmacy contact our office and allow 72 hours for refills to be completed.    Today you received the following chemotherapy and/or immunotherapy agents ZIRABEV      To help prevent nausea and vomiting after your treatment, we encourage you to take your nausea medication as directed.  BELOW ARE SYMPTOMS THAT SHOULD BE REPORTED IMMEDIATELY: *FEVER GREATER THAN 100.4 F (38 C) OR HIGHER *CHILLS OR SWEATING *NAUSEA AND VOMITING THAT IS NOT CONTROLLED WITH YOUR NAUSEA MEDICATION *UNUSUAL SHORTNESS OF BREATH *UNUSUAL BRUISING OR BLEEDING *URINARY PROBLEMS (pain or burning when urinating, or frequent urination) *BOWEL PROBLEMS (unusual diarrhea, constipation, pain near the anus) TENDERNESS IN MOUTH AND THROAT WITH OR WITHOUT PRESENCE OF ULCERS (sore throat, sores in mouth, or a toothache) UNUSUAL RASH, SWELLING OR PAIN  UNUSUAL VAGINAL DISCHARGE OR ITCHING   Items with * indicate a potential emergency and should be followed up as soon as possible or go to the Emergency Department if any problems should occur.  Please show the CHEMOTHERAPY ALERT CARD or IMMUNOTHERAPY ALERT CARD at check-in to the  Emergency Department and triage nurse.  Should you have questions after your visit or need to cancel or reschedule your appointment, please contact [facility] [place] AT [place]  [phone] and follow the prompts.  Office hours are 8:00 a.m. to 4:30 p.m. Monday - Friday. Please note that voicemails left after 4:00 p.m. may not be returned until the following business day.  We are closed weekends and major holidays. You have access to a nurse at all times for urgent questions. Please call the main number to the clinic [phone] and follow the prompts.  For any non-urgent questions, you may also contact your provider using MyChart. We now offer e-Visits for anyone 51 and older to request care online for non-urgent symptoms. For details visit [email or web address].   Also download the MyChart app! Go to the app store, search "MyChart", open the app, select [facility], and log in with your MyChart username and password.  Due to Covid, a mask is required upon entering the hospital/clinic. If you do not have a mask, one will be given to you upon arrival. For doctor visits, patients may have 1 support person aged 46 or older with them. For treatment visits, patients cannot have anyone with them due to current Covid guidelines and our immunocompromised population.   Bevacizumab injection What is this medication? BEVACIZUMAB (be va SIZ yoo mab) is a monoclonal antibody. It is used to treat many types of cancer. This medicine may be used for other purposes; ask your health care provider or pharmacist if you have questions. COMMON BRAND NAME(S): Alymsys, Avastin, MVASI, Zirabev What should I tell  my care team before I take this medication? They need to know if you have any of these conditions: diabetes heart disease high blood pressure history of coughing up blood prior anthracycline chemotherapy (e.g., doxorubicin, daunorubicin, epirubicin) recent or ongoing radiation  therapy recent or planning to have surgery stroke an unusual or allergic reaction to bevacizumab, hamster proteins, mouse proteins, other medicines, foods, dyes, or preservatives pregnant or trying to get pregnant breast-feeding How should I use this medication? This medicine is for infusion into a vein. It is given by a health care professional in a hospital or clinic setting. Talk to your pediatrician regarding the use of this medicine in children. Special care may be needed. Overdosage: If you think you have taken too much of this medicine contact a poison control center or emergency room at once. NOTE: This medicine is only for you. Do not share this medicine with others. What if I miss a dose? It is important not to miss your dose. Call your doctor or health care professional if you are unable to keep an appointment. What may interact with this medication? Interactions are not expected. This list may not describe all possible interactions. Give your health care provider a list of all the medicines, herbs, non-prescription drugs, or dietary supplements you use. Also tell them if you smoke, drink alcohol, or use illegal drugs. Some items may interact with your medicine. What should I watch for while using this medication? Your condition will be monitored carefully while you are receiving this medicine. You will need important blood work and urine testing done while you are taking this medicine. This medicine may increase your risk to bruise or bleed. Call your doctor or health care professional if you notice any unusual bleeding. Before having surgery, talk to your health care provider to make sure it is ok. This drug can increase the risk of poor healing of your surgical site or wound. You will need to stop this drug for 28 days before surgery. After surgery, wait at least 28 days before restarting this drug. Make sure the surgical site or wound is healed enough before restarting this drug.  Talk to your health care provider if questions. Do not become pregnant while taking this medicine or for 6 months after stopping it. Women should inform their doctor if they wish to become pregnant or think they might be pregnant. There is a potential for serious side effects to an unborn child. Talk to your health care professional or pharmacist for more information. Do not breast-feed an infant while taking this medicine and for 6 months after the last dose. This medicine has caused ovarian failure in some women. This medicine may interfere with the ability to have a child. You should talk to your doctor or health care professional if you are concerned about your fertility. What side effects may I notice from receiving this medication? Side effects that you should report to your doctor or health care professional as soon as possible: allergic reactions like skin rash, itching or hives, swelling of the face, lips, or tongue chest pain or chest tightness chills coughing up blood high fever seizures severe constipation signs and symptoms of bleeding such as bloody or black, tarry stools; red or dark-brown urine; spitting up blood or brown material that looks like coffee grounds; red spots on the skin; unusual bruising or bleeding from the eye, gums, or nose signs and symptoms of a blood clot such as breathing problems; chest pain; severe, sudden headache; pain,  swelling, warmth in the leg signs and symptoms of a stroke like changes in vision; confusion; trouble speaking or understanding; severe headaches; sudden numbness or weakness of the face, arm or leg; trouble walking; dizziness; loss of balance or coordination stomach pain sweating swelling of legs or ankles vomiting weight gain Side effects that usually do not require medical attention (report to your doctor or health care professional if they continue or are bothersome): back pain changes in taste decreased appetite dry  skin nausea tiredness This list may not describe all possible side effects. Call your doctor for medical advice about side effects. You may report side effects to FDA at 1-800-FDA-1088. Where should I keep my medication? This drug is given in a hospital or clinic and will not be stored at home. NOTE: This sheet is a summary. It may not cover all possible information. If you have questions about this medicine, talk to your doctor, pharmacist, or health care provider.  2022 Elsevier/Gold Standard (2020-12-14 00:00:00)

## 2021-05-23 ENCOUNTER — Ambulatory Visit: Payer: Medicare Other | Admitting: Internal Medicine

## 2021-06-03 ENCOUNTER — Encounter: Payer: Self-pay | Admitting: Oncology

## 2021-06-03 ENCOUNTER — Inpatient Hospital Stay: Payer: Medicare Other

## 2021-06-03 ENCOUNTER — Other Ambulatory Visit: Payer: Self-pay

## 2021-06-03 ENCOUNTER — Inpatient Hospital Stay (HOSPITAL_BASED_OUTPATIENT_CLINIC_OR_DEPARTMENT_OTHER): Payer: Medicare Other | Admitting: Oncology

## 2021-06-03 VITALS — BP 151/92 | HR 76 | Temp 97.8°F | Resp 14 | Wt 118.4 lb

## 2021-06-03 DIAGNOSIS — E039 Hypothyroidism, unspecified: Secondary | ICD-10-CM

## 2021-06-03 DIAGNOSIS — D701 Agranulocytosis secondary to cancer chemotherapy: Secondary | ICD-10-CM | POA: Diagnosis not present

## 2021-06-03 DIAGNOSIS — T451X5A Adverse effect of antineoplastic and immunosuppressive drugs, initial encounter: Secondary | ICD-10-CM | POA: Diagnosis not present

## 2021-06-03 DIAGNOSIS — R808 Other proteinuria: Secondary | ICD-10-CM | POA: Diagnosis not present

## 2021-06-03 DIAGNOSIS — C569 Malignant neoplasm of unspecified ovary: Secondary | ICD-10-CM | POA: Diagnosis not present

## 2021-06-03 DIAGNOSIS — D6481 Anemia due to antineoplastic chemotherapy: Secondary | ICD-10-CM | POA: Diagnosis not present

## 2021-06-03 DIAGNOSIS — C561 Malignant neoplasm of right ovary: Secondary | ICD-10-CM | POA: Diagnosis not present

## 2021-06-03 DIAGNOSIS — Z5111 Encounter for antineoplastic chemotherapy: Secondary | ICD-10-CM

## 2021-06-03 DIAGNOSIS — Z5112 Encounter for antineoplastic immunotherapy: Secondary | ICD-10-CM | POA: Diagnosis not present

## 2021-06-03 LAB — COMPREHENSIVE METABOLIC PANEL
ALT: 10 U/L (ref 0–44)
AST: 19 U/L (ref 15–41)
Albumin: 3.6 g/dL (ref 3.5–5.0)
Alkaline Phosphatase: 51 U/L (ref 38–126)
Anion gap: 7 (ref 5–15)
BUN: 17 mg/dL (ref 8–23)
CO2: 23 mmol/L (ref 22–32)
Calcium: 9.5 mg/dL (ref 8.9–10.3)
Chloride: 102 mmol/L (ref 98–111)
Creatinine, Ser: 0.72 mg/dL (ref 0.44–1.00)
GFR, Estimated: >60 mL/min (ref >60)
Glucose, Bld: 110 mg/dL — ABNORMAL HIGH (ref 70–99)
Potassium: 3.7 mmol/L (ref 3.5–5.1)
Sodium: 132 mmol/L — ABNORMAL LOW (ref 135–145)
Total Bilirubin: 0.3 mg/dL (ref 0.3–1.2)
Total Protein: 6.6 g/dL (ref 6.5–8.1)

## 2021-06-03 LAB — CBC WITH DIFFERENTIAL/PLATELET
Abs Immature Granulocytes: 0.01 10*3/uL (ref 0.00–0.07)
Basophils Absolute: 0 10*3/uL (ref 0.0–0.1)
Basophils Relative: 1 %
Eosinophils Absolute: 0.1 10*3/uL (ref 0.0–0.5)
Eosinophils Relative: 3 %
HCT: 27.8 % — ABNORMAL LOW (ref 36.0–46.0)
Hemoglobin: 9.2 g/dL — ABNORMAL LOW (ref 12.0–15.0)
Immature Granulocytes: 0 %
Lymphocytes Relative: 21 %
Lymphs Abs: 0.7 10*3/uL (ref 0.7–4.0)
MCH: 35.1 pg — ABNORMAL HIGH (ref 26.0–34.0)
MCHC: 33.1 g/dL (ref 30.0–36.0)
MCV: 106.1 fL — ABNORMAL HIGH (ref 80.0–100.0)
Monocytes Absolute: 0.5 10*3/uL (ref 0.1–1.0)
Monocytes Relative: 15 %
Neutro Abs: 2.1 10*3/uL (ref 1.7–7.7)
Neutrophils Relative %: 60 %
Platelets: 111 10*3/uL — ABNORMAL LOW (ref 150–400)
RBC: 2.62 MIL/uL — ABNORMAL LOW (ref 3.87–5.11)
RDW: 15.7 % — ABNORMAL HIGH (ref 11.5–15.5)
WBC: 3.5 10*3/uL — ABNORMAL LOW (ref 4.0–10.5)
nRBC: 0 % (ref 0.0–0.2)

## 2021-06-03 LAB — PROTEIN, URINE, RANDOM: Total Protein, Urine: 183 mg/dL

## 2021-06-03 MED ORDER — PEGFILGRASTIM 6 MG/0.6ML ~~LOC~~ PSKT
6.0000 mg | PREFILLED_SYRINGE | Freq: Once | SUBCUTANEOUS | Status: AC
  Administered 2021-06-03: 6 mg via SUBCUTANEOUS
  Filled 2021-06-03: qty 0.6

## 2021-06-03 MED ORDER — DEXAMETHASONE SODIUM PHOSPHATE 10 MG/ML IJ SOLN
5.0000 mg | Freq: Once | INTRAMUSCULAR | Status: AC
  Administered 2021-06-03: 5 mg via INTRAVENOUS
  Filled 2021-06-03: qty 1

## 2021-06-03 MED ORDER — SODIUM CHLORIDE 0.9 % IV SOLN
10.0000 mg/kg | Freq: Once | INTRAVENOUS | Status: DC
  Filled 2021-06-03: qty 20

## 2021-06-03 MED ORDER — HEPARIN SOD (PORK) LOCK FLUSH 100 UNIT/ML IV SOLN
500.0000 [IU] | Freq: Once | INTRAVENOUS | Status: AC | PRN
  Administered 2021-06-03: 500 [IU]
  Filled 2021-06-03: qty 5

## 2021-06-03 MED ORDER — DOXORUBICIN HCL LIPOSOMAL CHEMO INJECTION 2 MG/ML
40.0000 mg/m2 | Freq: Once | INTRAVENOUS | Status: AC
  Administered 2021-06-03: 64 mg via INTRAVENOUS
  Filled 2021-06-03: qty 25

## 2021-06-03 MED ORDER — SODIUM CHLORIDE 0.9 % IV SOLN
Freq: Once | INTRAVENOUS | Status: AC
  Filled 2021-06-03: qty 250

## 2021-06-03 MED ORDER — SODIUM CHLORIDE 0.9 % IV SOLN: 5.0000 mg | Freq: Once | INTRAVENOUS | Status: DC

## 2021-06-03 MED ORDER — DEXTROSE 5 % IV SOLN
Freq: Once | INTRAVENOUS | Status: AC
  Filled 2021-06-03: qty 250

## 2021-06-03 NOTE — Addendum Note (Signed)
Addended by: Earlie Server on: 06/03/2021 04:00 PM   Modules accepted: Orders

## 2021-06-03 NOTE — Progress Notes (Signed)
Urine protein is 183. Dr Tasia Catchings aware- Hold avastin proceed with Doxil

## 2021-06-03 NOTE — Progress Notes (Signed)
Pt in for follow up, husband present.  Pt denies any difficulties or concerns today.

## 2021-06-03 NOTE — Progress Notes (Signed)
Hematology/Oncology Progress  note [phone]      Patient Care Team: Cletis Athens, MD as PCP - General (Internal Medicine) Clent Jacks, [name], [name], [name], MD as Referring Physician (Obstetrics and Gynecology)  CHIEF COMPLAINTS/PURPOSE OF Visit Follow up for chemotherapy for ovarian cancer.  HISTORY OF PRESENTING ILLNESS: Katie Hill 71 y.o. female presents for follow up of management of stage IIIC ovarian cancer. She underwent  Neoadjuvant chemotherapy of carbo and taxol x 4  # Patient had debulking surgery on 03/14/2017. She had a laparoscopy with conversion to laparotomy, total hysterectomy, with bilateral salpingo oophorectomy, right aortic lymph node dissection, omentectomy. Pathology showed small foci of residual disease in ovary.   Genetic testing negative for 83 genes on [facility]'s Multi-Cancer panel (ALK, APC, ATM, AXIN2, BAP1, BARD1, BLM, BMPR1A, BRCA1, BRCA2, BRIP1, CASR, CDC73, CDH1, CDK4, CDKN1B, CDKN1C, CDKN2A, CEBPA, CHEK2, CTNNA1, DICER1, DIS3L2, EGFR, EPCAM, FH, FLCN, GATA2, GPC3, GREM1, HOXB13, HRAS, KIT, MAX, MEN1, MET, MITF, MLH1, MSH2, MSH3, MSH6, MUTYH, NBN, NF1, NF2, NTHL1, PALB2, PDGFRA, PHOX2B, PMS2, POLD1, POLE, POT1, PRKAR1A, PTCH1, PTEN, RAD50, RAD51C, RAD51D, RB1, RECQL4, RET, RUNX1, SDHA, SDHAF2, SDHB, SDHC, SDHD, SMAD4, SMARCA4, SMARCB1, SMARCE1, STK11, SUFU, TERC, TERT, TMEM127, TP53, TSC1, TSC2, VHL, WRN, WT1).   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  Myraid testing negative for somatic BRACA1/2, positive for HRD.   # HRD positive, was referred to [facility] for clinical trials of Olarparib maintenance. She opted out.  #  Treatment:  Stage IIIC Ovarian cancer:  #s/p Carboplatin and taxol x 4 neoadjuvant chemotherapy followed by debulking surgery  03/14/2017 # 03/28/2017 S/p Adjuvant carbo and taxol x3   Local recurrence, CA125 one 46.3 03/15/2018-05/17/2018 Carboplatin and Taxol x 4. CA125 decrease from 49.7 to 6 after 4 cycles of treatment.  Started on Olarparib 359m BID on 05/17/2018.  # was seen by Dr. BFransisca Connorsfor further evaluation due to rising Ca1 25 and MRI abdomenPelvis on 11/05/2018 showed Interval progression of, now measuring 2.7 x 2.3 cm and concerning for progression of metastatic disease.retrocaval lymph node in the abdomen Olaparib was held for short period of time. Her CA125 trended down again.  Dr. BFransisca Connorsrecommending resume olaparib and continue monitor. If she has more significant progression of disease she may be a candidate for clinical trial or will be treated with other chemotherapy drugs including Doxil, gemcitabine and Avastin.  #10/06/2020, CT chest abdomen pelvis with contrast showed new lymph nodes in the prevascular space of the upper anterior mediastinum most consistent with metastasis.  Single new right lower lobe pulmonary nodule is concerning for early pulmonary metastasis.  Interval enlargement of the left periaortic retroperitoneal lymph node.  Stable adjacent aorto caval adenopathy.  10/29/2020 MUGA LVEF normal 10/29/2020 Stopped Olarparib  11/03/2020 start Doxil + bevacizumab 01/14/21 Echocardiogram -LVEF 55-60%  01/26/2021, CT chest abdomen pelvis-partial response-decrease of lung nodule.  Unchanged lymphadenopathy.  INTERVAL HISTORY 71y.o. female with above oncology history reviewed by me today presents for evaluation for chemotherapy for treatment of ovarian cancer  Patient was accompanied by her husband.   Patient denies any new complaints.  Slightly more tired, patient remains active and coping with fatigue. . Review of Systems  Constitutional:  Positive for fatigue. Negative for appetite change, chills and fever.  HENT:   Negative for hearing loss and voice change.   Eyes:  Negative for  eye  problems.  Respiratory:  Negative for chest tightness and cough.   Cardiovascular:  Negative for chest pain.  Gastrointestinal:  Negative for abdominal distention, abdominal pain and blood in stool.  Endocrine: Negative for hot flashes.  Genitourinary:  Negative for difficulty urinating and frequency.   Musculoskeletal:  Negative for arthralgias.  Skin:  Negative for itching and rash.  Neurological:  Negative for extremity weakness.  Hematological:  Negative for adenopathy.  Psychiatric/Behavioral:  Negative for confusion.     [name] MEDICAL HISTORY: Past Medical History:  Diagnosis Date   Dysrhythmia    Genetic testing 03/28/2017   Multi-Cancer panel (83 genes) @ [facility] - No pathogenic mutations detected   High grade ovarian cancer ([place]) 11/20/2016   Pelvic mass in female     SURGICAL HISTORY: Past Surgical History:  Procedure Laterality Date   APPENDECTOMY     LAPAROSCOPY N/A 03/14/2017   Procedure: LAPAROSCOPY OPERATIVE;  Surgeon: Mellody Drown, MD;  Location: [facility];  Service: Gynecology;  Laterality: N/A;   LAPAROTOMY N/A 03/14/2017   Procedure: LAPAROTOMY;  Surgeon: Mellody Drown, MD;  Location: [facility];  Service: Gynecology;  Laterality: N/A;   LYMPH NODE DISSECTION N/A 03/14/2017   Procedure: LYMPH NODE DISSECTION;  Surgeon: Mellody Drown, MD;  Location: [facility];  Service: Gynecology;  Laterality: N/A;   OMENTECTOMY N/A 03/14/2017   Procedure: OMENTECTOMY;  Surgeon: Mellody Drown, MD;  Location: [facility];  Service: Gynecology;  Laterality: N/A;   PORTA CATH INSERTION N/A 11/27/2016   Procedure: Glori Luis Cath Insertion;  Surgeon: Algernon Huxley, MD;  Location: [place] [facility];  Service: Cardiovascular;  Laterality: N/A;    SOCIAL HISTORY: Social History   Tobacco Use   Smoking status: Never   Smokeless tobacco: Never  Vaping Use   Vaping Use: Never used  Substance Use Topics   Alcohol use: Not Currently   Drug use: No     FAMILY HISTORY Family  History  Problem Relation Age of Onset   Throat cancer Cousin    Throat cancer Cousin    Leukemia Cousin     ALLERGIES:  is allergic to omeprazole.  MEDICATIONS:  Current Outpatient Medications  Medication Sig Dispense Refill   amLODipine (NORVASC) 2.5 MG tablet Take 1 tablet (2.5 mg total) by mouth daily. 30 tablet 4   levothyroxine (SYNTHROID) 100 MCG tablet Take 1 tablet (100 mcg total) by mouth daily. 90 tablet 3   lidocaine-prilocaine (EMLA) cream Apply 1 application topically as needed. Apply small amount to port site at least 1 hour prior to it being accessed, cover with plastic wrap 30 g 1   ondansetron (ZOFRAN) 8 MG tablet Take 1 tablet (8 mg total) by mouth every 8 (eight) hours as needed for nausea or vomiting. (Patient not taking: Reported on 05/06/2021) 60 tablet 1   No current facility-administered medications for this visit.   Facility-Administered Medications Ordered in Other Visits  Medication Dose Route Frequency Provider Last Rate Last Admin   bevacizumab-bvzr (ZIRABEV) 500 mg in sodium chloride 0.9 % 100 mL chemo infusion  10 mg/kg (Order-Specific) Intravenous Once Earlie Server, MD        PHYSICAL EXAMINATION:  ECOG PERFORMANCE STATUS: 0 - Asymptomatic Vitals:   06/03/21 0846  BP: (!) 151/92  Pulse: 76  Resp: 14  Temp: 97.8 F (36.6 C)  SpO2: 100%    Filed Weights   06/03/21 0846  Weight: 118 lb 6.4 oz (53.7 kg)     Physical Exam Constitutional:  General: She is not in acute distress.    Appearance: She is not diaphoretic.     Comments: Thin built, she walks independantly  HENT:     Head: Normocephalic and atraumatic.     Nose: Nose normal.     Mouth/Throat:     Pharynx: No oropharyngeal exudate.  Eyes:     General: No scleral icterus.    Pupils: Pupils are equal, round, and reactive to light.  Neck:     Vascular: No JVD.  Cardiovascular:     Rate and Rhythm: Normal rate and regular rhythm.     Heart sounds: No murmur heard. Pulmonary:      Effort: Pulmonary effort is normal. No respiratory distress.     Breath sounds: Normal breath sounds. No rales.  Chest:     Chest wall: No tenderness.  Abdominal:     General: Bowel sounds are normal. There is no distension.     Palpations: Abdomen is soft.     Tenderness: There is no abdominal tenderness.  Musculoskeletal:        General: Normal range of motion.     Cervical back: Normal range of motion and neck supple.  Lymphadenopathy:     Cervical: No cervical adenopathy.  Skin:    General: Skin is warm and dry.     Findings: No erythema or rash.     Comments: Right anterior medi port +   Neurological:     Mental Status: She is alert and oriented to person, place, and time.     Cranial Nerves: No cranial nerve deficit.     Motor: No abnormal muscle tone.     Coordination: Coordination normal.  Psychiatric:        Mood and Affect: Affect normal.        Judgment: Judgment normal.       LABORATORY DATA: I have personally reviewed the data as listed: CBC    Component Value Date/Time   WBC 3.5 (L) 06/03/2021 0825   RBC 2.62 (L) 06/03/2021 0825   HGB 9.2 (L) 06/03/2021 0825   HCT 27.8 (L) 06/03/2021 0825   PLT 111 (L) 06/03/2021 0825   MCV 106.1 (H) 06/03/2021 0825   MCH 35.1 (H) 06/03/2021 0825   MCHC 33.1 06/03/2021 0825   RDW 15.7 (H) 06/03/2021 0825   LYMPHSABS 0.7 06/03/2021 0825   MONOABS 0.5 06/03/2021 0825   EOSABS 0.1 06/03/2021 0825   BASOSABS 0.0 06/03/2021 0825   CMP Latest Ref Rng & Units 06/03/2021 05/20/2021 05/06/2021  Glucose 70 - 99 mg/dL 110(H) 105(H) 88  BUN 8 - 23 mg/dL '17 13 13  ' Creatinine 0.44 - 1.00 mg/dL 0.72 0.69 0.51  Sodium 135 - 145 mmol/L 132(L) 133(L) 129(L)  Potassium 3.5 - 5.1 mmol/L 3.7 3.8 4.0  Chloride 98 - 111 mmol/L 102 100 99  CO2 22 - 32 mmol/L '23 24 24  ' Calcium 8.9 - 10.3 mg/dL 9.5 9.8 9.5  Total Protein 6.5 - 8.1 g/dL 6.6 7.1 7.1  Total Bilirubin 0.3 - 1.2 mg/dL 0.3 0.2(L) 0.8  Alkaline Phos 38 - 126 U/L 51 70 54   AST 15 - 41 U/L '19 21 18  ' ALT 0 - 44 U/L '10 10 10    ' Pathology 11/16/2016 Surgical Pathology  CASE: [id number]  PATIENT: Katie Hill  Surgical Pathology Report   SPECIMEN SUBMITTED:  A. Retroperitoneal adenopathy, left  DIAGNOSIS:  A. LYMPH NODE, LEFT RETROPERITONEAL; CT-GUIDED CORE BIOPSY:  - METASTATIC HIGH-GRADE SEROUS CARCINOMA.  Pathology  03/14/2017   DIAGNOSIS:  A. OMENTUM; OMENTECTOMY:  - NO TUMOR SEEN.  - ONE NEGATIVE LYMPH NODE (0/1).   B.  RIGHT FALLOPIAN TUBE AND OVARY; SALPINGO-OOPHORECTOMY:  - SMALL FOCI OF HIGH GRADE SEROUS CARCINOMA INVOLVING THE OVARY.  - MARKED THERAPY RELATED CHANGE.  - NO TUMOR SEEN IN THE FALLOPIAN TUBE.   C.  UTERUS, CERVIX, LEFT FALLOPIAN TUBE AND OVARY; HYSTERECTOMY AND LEFT  SALPINGO-OOPHORECTOMY:  - NABOTHIAN CYSTS.  - CYSTIC ATROPHY OF THE ENDOMETRIUM.  - SEROSAL ADHESIONS.  - UNREMARKABLE FALLOPIAN TUBE.  - OVARY SHOWING TREATMENT RELATED CHANGE.   D.  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    RADIOGRAPHIC STUDIES: I have personally reviewed the radiological images as listed and agreed with the findings in the report. CT CHEST ABDOMEN PELVIS W CONTRAST  Result Date: 05/17/2021 CLINICAL DATA:  71 year old female with history of ovarian cancer. Evaluate for treatment response. EXAM: CT CHEST, ABDOMEN, AND PELVIS WITH CONTRAST TECHNIQUE: Multidetector CT imaging of the chest, abdomen and pelvis was performed following the standard protocol during bolus administration of intravenous contrast. RADIATION DOSE REDUCTION: This exam was performed according to the departmental dose-optimization program which includes automated exposure control, adjustment of the mA and/or kV according to patient size and/or use of iterative reconstruction technique. CONTRAST:  137m OMNIPAQUE IOHEXOL 300 MG/ML  SOLN COMPARISON:  CT the chest, abdomen and pelvis 01/26/2021.  FINDINGS: CT CHEST FINDINGS Cardiovascular: Heart size is normal. There is no significant pericardial fluid, thickening or pericardial calcification. Aortic atherosclerosis. No definite coronary artery calcifications. Right internal jugular single-lumen porta cath with tip terminating in the distal superior vena cava. Mediastinum/Nodes: Previously noted superior mediastinal lymph node adjacent to the origins of the innominate and left common carotid arteries (axial image 11 of series 2) measures 1 cm in short axis (decreased from 1.3 cm on prior examination). Previously noted prevascular lymph node has also decreased in size, currently only 5 mm in short axis (axial image 20 of series 2). No other pathologically enlarged mediastinal or hilar lymph nodes are noted elsewhere. Esophagus is unremarkable in appearance. No axillary lymphadenopathy. Lungs/Pleura: 6 mm left lower lobe pulmonary nodule (axial image 93 of series 4), stable. Previously noted nodule in the periphery of the right lower lobe is no longer identifiable. No other new suspicious appearing pulmonary nodules or masses are noted. No acute consolidative airspace disease. No pleural effusions. Musculoskeletal: There are no aggressive appearing lytic or blastic lesions noted in the visualized portions of the skeleton. CT ABDOMEN PELVIS FINDINGS Hepatobiliary: No suspicious cystic or solid hepatic lesions. No intra or extrahepatic biliary ductal dilatation. Gallbladder is normal in appearance. Pancreas: No pancreatic mass. No pancreatic ductal dilatation. No pancreatic or peripancreatic fluid collections or inflammatory changes. Spleen: Unremarkable. Adrenals/Urinary Tract: Well-defined subcentimeter low-attenuation lesions in both kidneys, too small to characterize, but statistically likely to represent tiny cysts. No new suspicious appearing renal lesions. No hydroureteronephrosis. Urinary bladder is nearly decompressed, but otherwise unremarkable in  appearance. Bilateral adrenal glands are normal in appearance. Stomach/Bowel: The appearance of the stomach is normal. No pathologic dilatation of small bowel or colon. The appendix is not confidently identified and may be surgically absent. Regardless, there are no inflammatory changes noted adjacent to the cecum to suggest the presence of an acute appendicitis at this time. Vascular/Lymphatic: Aortic atherosclerosis, without evidence of aneurysm or dissection in the abdominal or pelvic vasculature. Mildly enlarged left para-aortic lymph node adjacent to the  left renal hilum (axial image 67 of series 2) measuring 1.3 cm in short axis (previously 1.5 cm). No other lymphadenopathy confidently identified elsewhere in the abdomen or pelvis. Reproductive: Status post total abdominal hysterectomy and bilateral salpingo-oophorectomy. Other: Status post omentectomy. In the posterolateral aspect of the right hemipelvis (axial image 110 of series 2) there is again a 3.3 x 1.4 cm intermediate attenuation lesion, which is unchanged in size when measured in a similar fashion on the prior examination. No significant volume of ascites. No pneumoperitoneum. Musculoskeletal: There are no aggressive appearing lytic or blastic lesions noted in the visualized portions of the skeleton. IMPRESSION: 1. Slight decreased size of lymph nodes in the mediastinum and upper left retroperitoneum, as detailed above. This may represent treated metastatic disease. 2. Previously noted soft tissue attenuation lesion in the posterolateral aspect of the right hemipelvis, stable compared to the prior examination, potentially a treated site of peritoneal disease. 3. No new signs of metastatic disease are noted elsewhere in the chest, abdomen or pelvis. 4. Additional incidental findings, as above. Electronically Signed   By: Vinnie Langton M.D.   On: 05/17/2021 06:39     ASSESSMENT/PLAN  Cancer Staging  Malignant neoplasm of ovary [facility]) Staging  form: Ovary, Fallopian Tube, and Primary Peritoneal Carcinoma, AJCC 8th Edition - Clinical stage from 11/22/2016: Stage IIIC (cT3c, cN1b, cM0) - Signed by Earlie Server, MD on 11/22/2016 Stage used in treatment planning: Yes National guidelines used in treatment planning: Yes Type of national guideline used in treatment planning: NCCN  1. High grade ovarian cancer ([place])   2. Encounter for antineoplastic chemotherapy   3. Anemia associated with chemotherapy   4. Chemotherapy induced neutropenia ([facility])   5. Acquired hypothyroidism   6. Malignant neoplasm of ovary, unspecified laterality ([place])   7. Other proteinuria   : # Ovarian Cancer, local recurrence.  Currently on olaparib 300 mg twice daily. She tolerates well.  CA 125 18.7-->61.2-->111-->90.5-->69--> 54.8--> 29.1-->47-->71.2-->67.7->73.2-->82.8-->70.4-->66.4--> 58.4--> 64.9--> 56.9--> 42.2-->47.7-->54.4-->68.2-> 97-->168-->70.3--> 57.4--> 49.4->33.7-->32-->23  Labs reviewed and discussed with patient Proceed with cycle 8 Doxil  Hold bevacizumab today.  On pro Neulasta support.- Day 15 bevacizumab is canceled due to proteinuria. 05/16/2021, CT chest abdomen pelvis with contrast showed a slightly decreased size of lymph nodes in the mediastinum and upper left retroperitoneum.  Previous noted a soft tissue attenuation lesion in the posterolateral aspect of the right hemipelvis, stable   #Proteinuria is secondary to bevacizumab.  Bevacizumab is held. Repeat urine protein at next visit.   #Hypertension, likely secondary to bevacizumab.   Continue Norvasc 2.5 mg daily.  Chronic anemia, chemotherapy-induced.   Hemoglobin is 9.2 today, close monitor. Chronic macrocytosis with adequate B12 and folate  #hypothyroidism, Synthroid was increased by PCP to 100 MCG daily.   I would defer further management to primary care provider  Hyponatremia, likely secondary to hypothyroidism.  Stable.  Port-A-Cath in place Patient declined influenza  vaccination.  RTF . 4 weeks for lab Doxil and bevacizumab.    Earlie Server, MD, PhD 06/03/21

## 2021-06-03 NOTE — Patient Instructions (Signed)
[facility] AT [place]  Discharge Instructions: Thank you for choosing [facility] to provide your oncology and hematology care.  If you have a lab appointment with the [facility], please go directly to the [facility] and check in at the registration area.  Wear comfortable clothing and clothing appropriate for easy access to any Portacath or PICC line.   We strive to give you quality time with your provider. You may need to reschedule your appointment if you arrive late (15 or more minutes).  Arriving late affects you and other patients whose appointments are after yours.  Also, if you miss three or more appointments without notifying the office, you may be dismissed from the clinic at the providers discretion.      For prescription refill requests, have your pharmacy contact our office and allow 72 hours for refills to be completed.    Today you received the following chemotherapy and/or immunotherapy agents ZIRABEV, DOXIL, NEULASTA      To help prevent nausea and vomiting after your treatment, we encourage you to take your nausea medication as directed.  BELOW ARE SYMPTOMS THAT SHOULD BE REPORTED IMMEDIATELY: *FEVER GREATER THAN 100.4 F (38 C) OR HIGHER *CHILLS OR SWEATING *NAUSEA AND VOMITING THAT IS NOT CONTROLLED WITH YOUR NAUSEA MEDICATION *UNUSUAL SHORTNESS OF BREATH *UNUSUAL BRUISING OR BLEEDING *URINARY PROBLEMS (pain or burning when urinating, or frequent urination) *BOWEL PROBLEMS (unusual diarrhea, constipation, pain near the anus) TENDERNESS IN MOUTH AND THROAT WITH OR WITHOUT PRESENCE OF ULCERS (sore throat, sores in mouth, or a toothache) UNUSUAL RASH, SWELLING OR PAIN  UNUSUAL VAGINAL DISCHARGE OR ITCHING   Items with * indicate a potential emergency and should be followed up as soon as possible or go to the Emergency Department if any problems should occur.  Please show the CHEMOTHERAPY ALERT CARD or IMMUNOTHERAPY ALERT CARD  at check-in to the Emergency Department and triage nurse.  Should you have questions after your visit or need to cancel or reschedule your appointment, please contact [facility]CER [place] AT [place]  [phone] and follow the prompts.  Office hours are 8:00 a.m. to 4:30 p.m. Monday - Friday. Please note that voicemails left after 4:00 p.m. may not be returned until the following business day.  We are closed weekends and major holidays. You have access to a nurse at all times for urgent questions. Please call the main number to the clinic [phone] and follow the prompts.  For any non-urgent questions, you may also contact your provider using MyChart. We now offer e-Visits for anyone 49 and older to request care online for non-urgent symptoms. For details visit [email or web address].   Also download the MyChart app! Go to the app store, search "MyChart", open the app, select [facility], and log in with your MyChart username and password.  Due to Covid, a mask is required upon entering the hospital/clinic. If you do not have a mask, one will be given to you upon arrival. For doctor visits, patients may have 1 support person aged 54 or older with them. For treatment visits, patients cannot have anyone with them due to current Covid guidelines and our immunocompromised population.   Bevacizumab injection What is this medication? BEVACIZUMAB (be va SIZ yoo mab) is a monoclonal antibody. It is used to treat many types of cancer. This medicine may be used for other purposes; ask your health care provider or pharmacist if you have questions. COMMON BRAND NAME(S): Alymsys, Avastin, MVASI, Zirabev What should  I tell my care team before I take this medication? They need to know if you have any of these conditions: diabetes heart disease high blood pressure history of coughing up blood prior anthracycline chemotherapy (e.g., doxorubicin, daunorubicin, epirubicin) recent or ongoing  radiation therapy recent or planning to have surgery stroke an unusual or allergic reaction to bevacizumab, hamster proteins, mouse proteins, other medicines, foods, dyes, or preservatives pregnant or trying to get pregnant breast-feeding How should I use this medication? This medicine is for infusion into a vein. It is given by a health care professional in a hospital or clinic setting. Talk to your pediatrician regarding the use of this medicine in children. Special care may be needed. Overdosage: If you think you have taken too much of this medicine contact a poison control center or emergency room at once. NOTE: This medicine is only for you. Do not share this medicine with others. What if I miss a dose? It is important not to miss your dose. Call your doctor or health care professional if you are unable to keep an appointment. What may interact with this medication? Interactions are not expected. This list may not describe all possible interactions. Give your health care provider a list of all the medicines, herbs, non-prescription drugs, or dietary supplements you use. Also tell them if you smoke, drink alcohol, or use illegal drugs. Some items may interact with your medicine. What should I watch for while using this medication? Your condition will be monitored carefully while you are receiving this medicine. You will need important blood work and urine testing done while you are taking this medicine. This medicine may increase your risk to bruise or bleed. Call your doctor or health care professional if you notice any unusual bleeding. Before having surgery, talk to your health care provider to make sure it is ok. This drug can increase the risk of poor healing of your surgical site or wound. You will need to stop this drug for 28 days before surgery. After surgery, wait at least 28 days before restarting this drug. Make sure the surgical site or wound is healed enough before restarting  this drug. Talk to your health care provider if questions. Do not become pregnant while taking this medicine or for 6 months after stopping it. Women should inform their doctor if they wish to become pregnant or think they might be pregnant. There is a potential for serious side effects to an unborn child. Talk to your health care professional or pharmacist for more information. Do not breast-feed an infant while taking this medicine and for 6 months after the last dose. This medicine has caused ovarian failure in some women. This medicine may interfere with the ability to have a child. You should talk to your doctor or health care professional if you are concerned about your fertility. What side effects may I notice from receiving this medication? Side effects that you should report to your doctor or health care professional as soon as possible: allergic reactions like skin rash, itching or hives, swelling of the face, lips, or tongue chest pain or chest tightness chills coughing up blood high fever seizures severe constipation signs and symptoms of bleeding such as bloody or black, tarry stools; red or dark-brown urine; spitting up blood or brown material that looks like coffee grounds; red spots on the skin; unusual bruising or bleeding from the eye, gums, or nose signs and symptoms of a blood clot such as breathing problems; chest pain; severe, sudden  headache; pain, swelling, warmth in the leg signs and symptoms of a stroke like changes in vision; confusion; trouble speaking or understanding; severe headaches; sudden numbness or weakness of the face, arm or leg; trouble walking; dizziness; loss of balance or coordination stomach pain sweating swelling of legs or ankles vomiting weight gain Side effects that usually do not require medical attention (report to your doctor or health care professional if they continue or are bothersome): back pain changes in taste decreased appetite dry  skin nausea tiredness This list may not describe all possible side effects. Call your doctor for medical advice about side effects. You may report side effects to FDA at 1-800-FDA-1088. Where should I keep my medication? This drug is given in a hospital or clinic and will not be stored at home. NOTE: This sheet is a summary. It may not cover all possible information. If you have questions about this medicine, talk to your doctor, pharmacist, or health care provider.  2022 Elsevier/Gold Standard (2020-12-14 00:00:00)  Doxorubicin Liposomal injection What is this medication? LIPOSOMAL DOXORUBICIN (LIP oh som al  dox oh ROO bi sin) is a chemotherapy drug. This medicine is used to treat many kinds of cancer like Kaposi's sarcoma, multiple myeloma, and ovarian cancer. This medicine may be used for other purposes; ask your health care provider or pharmacist if you have questions. COMMON BRAND NAME(S): Doxil, Lipodox What should I tell my care team before I take this medication? They need to know if you have any of these conditions: blood disorders heart disease infection (especially a virus infection such as chickenpox, cold sores, or herpes) liver disease recent or ongoing radiation therapy an unusual or allergic reaction to doxorubicin, other chemotherapy agents, soybeans, other medicines, foods, dyes, or preservatives pregnant or trying to get pregnant breast-feeding How should I use this medication? This drug is given as an infusion into a vein. It is administered in a hospital or clinic by a specially trained health care professional. If you have pain, swelling, burning or any unusual feeling around the site of your injection, tell your health care professional right away. Talk to your pediatrician regarding the use of this medicine in children. Special care may be needed. Overdosage: If you think you have taken too much of this medicine contact a poison control center or emergency room at  once. NOTE: This medicine is only for you. Do not share this medicine with others. What if I miss a dose? It is important not to miss your dose. Call your doctor or health care professional if you are unable to keep an appointment. What may interact with this medication? Do not take this medicine with any of the following medications: zidovudine This medicine may also interact with the following medications: medicines to increase blood counts like filgrastim, pegfilgrastim, sargramostim vaccines Talk to your doctor or health care professional before taking any of these medicines: acetaminophen aspirin ibuprofen ketoprofen naproxen This list may not describe all possible interactions. Give your health care provider a list of all the medicines, herbs, non-prescription drugs, or dietary supplements you use. Also tell them if you smoke, drink alcohol, or use illegal drugs. Some items may interact with your medicine. What should I watch for while using this medication? Your condition will be monitored carefully while you are receiving this medicine. You may need blood work done while you are taking this medicine. This drug may make you feel generally unwell. This is not uncommon, as chemotherapy can affect healthy cells as well  as cancer cells. Report any side effects. Continue your course of treatment even though you feel ill unless your doctor tells you to stop. Your urine may turn orange-red for a few days after your dose. This is not blood. If your urine is dark or brown, call your doctor. In some cases, you may be given additional medicines to help with side effects. Follow all directions for their use. Talk to your doctor about your risk of cancer. You may be more at risk for certain types of cancers if you take this medicine. Do not become pregnant while taking this medicine or for 6 months after stopping it. Women should inform their healthcare professional if they wish to become pregnant  or think they may be pregnant. Men should not father a child while taking this medicine and for 6 months after stopping it. There is a potential for serious side effects to an unborn child. Talk to your health care professional or pharmacist for more information. Do not breast-feed an infant while taking this medicine. This medicine has caused ovarian failure in some women. This medicine may make it more difficult to get pregnant. Talk to your healthcare professional if you are concerned about your fertility. This medicine has caused decreased sperm counts in some men. This may make it more difficult to father a child. Talk to your healthcare professional if you are concerned about your fertility. This medicine may cause a decrease in Co-Enzyme Q-10. You should make sure that you get enough Co-Enzyme Q-10 while you are taking this medicine. Discuss the foods you eat and the vitamins you take with your health care professional. What side effects may I notice from receiving this medication? Side effects that you should report to your doctor or health care professional as soon as possible: allergic reactions like skin rash, itching or hives, swelling of the face, lips, or tongue low blood counts - this medicine may decrease the number of white blood cells, red blood cells and platelets. You may be at increased risk for infections and bleeding. signs of hand-foot syndrome - tingling or burning, redness, flaking, swelling, small blisters, or small sores on the palms of your hands or the soles of your feet signs of infection - fever or chills, cough, sore throat, pain or difficulty passing urine signs of decreased platelets or bleeding - bruising, pinpoint red spots on the skin, black, tarry stools, blood in the urine signs of decreased red blood cells - unusually weak or tired, fainting spells, lightheadedness back pain, chills, facial flushing, fever, headache, tightness in the chest or throat during the  infusion breathing problems chest pain fast, irregular heartbeat mouth pain, redness, sores pain, swelling, redness at site where injected pain, tingling, numbness in the hands or feet swelling of ankles, feet, or hands vomiting Side effects that usually do not require medical attention (report to your doctor or health care professional if they continue or are bothersome): diarrhea hair loss loss of appetite nail discoloration or damage nausea red or watery eyes red colored urine stomach upset This list may not describe all possible side effects. Call your doctor for medical advice about side effects. You may report side effects to FDA at 1-800-FDA-1088. Where should I keep my medication? This drug is given in a hospital or clinic and will not be stored at home. NOTE: This sheet is a summary. It may not cover all possible information. If you have questions about this medicine, talk to your doctor, pharmacist, or health care  provider.  2022 Elsevier/Gold Standard (2017-12-05 00:00:00)  Pegfilgrastim Injection What is this medication? PEGFILGRASTIM (PEG fil gra stim) lowers the risk of infection in people who are receiving chemotherapy. It works by Building control surveyor make more white blood cells, which protects your body from infection. It may also be used to help people who have been exposed to high doses of radiation. This medicine may be used for other purposes; ask your health care provider or pharmacist if you have questions. COMMON BRAND NAME(S): Rexene Edison, Ziextenzo What should I tell my care team before I take this medication? They need to know if you have any of these conditions: Kidney disease Latex allergy Ongoing radiation therapy Sickle cell disease Skin reactions to acrylic adhesives (On-Body Injector only) An unusual or allergic reaction to pegfilgrastim, filgrastim, other medications, foods, dyes, or preservatives Pregnant or trying to get  pregnant Breast-feeding How should I use this medication? This medication is for injection under the skin. If you get this medication at home, you will be taught how to prepare and give the pre-filled syringe or how to use the On-body Injector. Refer to the patient Instructions for Use for detailed instructions. Use exactly as directed. Tell your care team immediately if you suspect that the On-body Injector may not have performed as intended or if you suspect the use of the On-body Injector resulted in a missed or partial dose. It is important that you put your used needles and syringes in a special sharps container. Do not put them in a trash can. If you do not have a sharps container, call your pharmacist or care team to get one. Talk to your care team about the use of this medication in children. While this medication may be prescribed for selected conditions, precautions do apply. Overdosage: If you think you have taken too much of this medicine contact a poison control center or emergency room at once. NOTE: This medicine is only for you. Do not share this medicine with others. What if I miss a dose? It is important not to miss your dose. Call your care team if you miss your dose. If you miss a dose due to an On-body Injector failure or leakage, a new dose should be administered as soon as possible using a single prefilled syringe for manual use. What may interact with this medication? Interactions have not been studied. This list may not describe all possible interactions. Give your health care provider a list of all the medicines, herbs, non-prescription drugs, or dietary supplements you use. Also tell them if you smoke, drink alcohol, or use illegal drugs. Some items may interact with your medicine. What should I watch for while using this medication? Your condition will be monitored carefully while you are receiving this medication. You may need blood work done while you are taking this  medication. Talk to your care team about your risk of cancer. You may be more at risk for certain types of cancer if you take this medication. If you are going to need a MRI, CT scan, or other procedure, tell your care team that you are using this medication (On-Body Injector only). What side effects may I notice from receiving this medication? Side effects that you should report to your care team as soon as possible: Allergic reactions--skin rash, itching, hives, swelling of the face, lips, tongue, or throat Capillary leak syndrome--stomach or muscle pain, unusual weakness or fatigue, feeling faint or lightheaded, decrease in the amount of urine, swelling  of the ankles, hands, or feet, trouble breathing High white blood cell level--fever, fatigue, trouble breathing, night sweats, change in vision, weight loss Inflammation of the aorta--fever, fatigue, back, chest, or stomach pain, severe headache Kidney injury (glomerulonephritis)--decrease in the amount of urine, red or dark brown urine, foamy or bubbly urine, swelling of the ankles, hands, or feet Shortness of breath or trouble breathing Spleen injury--pain in upper left stomach or shoulder Unusual bruising or bleeding Side effects that usually do not require medical attention (report to your care team if they continue or are bothersome): Bone pain Pain in the hands or feet This list may not describe all possible side effects. Call your doctor for medical advice about side effects. You may report side effects to FDA at 1-800-FDA-1088. Where should I keep my medication? Keep out of the reach of children. If you are using this medication at home, you will be instructed on how to store it. Throw away any unused medication after the expiration date on the label. NOTE: This sheet is a summary. It may not cover all possible information. If you have questions about this medicine, talk to your doctor, pharmacist, or health care provider.  2022  Elsevier/Gold Standard (2020-12-14 00:00:00)

## 2021-06-04 LAB — CA 125: Cancer Antigen (CA) 125: 15.1 U/mL (ref 0.0–38.1)

## 2021-06-06 ENCOUNTER — Other Ambulatory Visit: Payer: Self-pay

## 2021-06-06 ENCOUNTER — Encounter: Payer: Self-pay | Admitting: Internal Medicine

## 2021-06-06 ENCOUNTER — Ambulatory Visit (INDEPENDENT_AMBULATORY_CARE_PROVIDER_SITE_OTHER): Payer: Medicare Other | Admitting: Internal Medicine

## 2021-06-06 VITALS — BP 144/74 | HR 82 | Ht 67.0 in | Wt 117.6 lb

## 2021-06-06 DIAGNOSIS — C569 Malignant neoplasm of unspecified ovary: Secondary | ICD-10-CM | POA: Diagnosis not present

## 2021-06-06 DIAGNOSIS — D649 Anemia, unspecified: Secondary | ICD-10-CM | POA: Insufficient documentation

## 2021-06-06 DIAGNOSIS — D6481 Anemia due to antineoplastic chemotherapy: Secondary | ICD-10-CM | POA: Insufficient documentation

## 2021-06-06 DIAGNOSIS — E871 Hypo-osmolality and hyponatremia: Secondary | ICD-10-CM | POA: Diagnosis not present

## 2021-06-06 DIAGNOSIS — E038 Other specified hypothyroidism: Secondary | ICD-10-CM

## 2021-06-06 DIAGNOSIS — E063 Autoimmune thyroiditis: Secondary | ICD-10-CM | POA: Diagnosis not present

## 2021-06-06 DIAGNOSIS — D539 Nutritional anemia, unspecified: Secondary | ICD-10-CM | POA: Insufficient documentation

## 2021-06-06 NOTE — Assessment & Plan Note (Signed)
Patient is getting chemotherapy 

## 2021-06-06 NOTE — Progress Notes (Signed)
Established Patient Office Visit  Subjective:  Patient ID: Katie Hill, female    DOB: 23-Aug-1950  Age: 71 y.o. 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   Pelvic mass in female     Past Surgical History:  Procedure Laterality Date   APPENDECTOMY     LAPAROSCOPY N/A 03/14/2017   Procedure: LAPAROSCOPY OPERATIVE;  Surgeon: Mellody Drown, MD;  Location: [facility];  Service: Gynecology;  Laterality: N/A;   LAPAROTOMY N/A 03/14/2017   Procedure: LAPAROTOMY;  Surgeon: Mellody Drown, MD;  Location: [facility];  Service: Gynecology;  Laterality: N/A;   LYMPH NODE DISSECTION N/A 03/14/2017   Procedure: LYMPH NODE DISSECTION;  Surgeon: Mellody Drown, MD;  Location: [facility];  Service: Gynecology;  Laterality: N/A;   OMENTECTOMY N/A 03/14/2017   Procedure: OMENTECTOMY;  Surgeon: Mellody Drown, MD;  Location: [facility];  Service: Gynecology;  Laterality: N/A;   PORTA CATH INSERTION N/A 11/27/2016   Procedure: Glori Luis Cath Insertion;  Surgeon: Algernon Huxley, MD;  Location: [facility];  Service: Cardiovascular;  Laterality: N/A;    Family History  Problem Relation Age of Onset   Throat cancer Cousin    Throat cancer Cousin    Leukemia Cousin     Social History   Socioeconomic History   Marital status: Married    Spouse name: Not on file   Number of children: Not on file   Years of education: Not on file   Highest education level: Not on file  Occupational History   Not on file  Tobacco Use   Smoking status: Never   Smokeless tobacco: Never  Vaping Use   Vaping Use: Never used  Substance and Sexual Activity   Alcohol use: Not Currently   Drug use: No   Sexual activity: Yes     Birth control/protection: Post-menopausal  Other Topics Concern   Not on file  Social History Narrative   Not on file   Social Determinants of Health   Financial Resource Strain: Low Risk    Difficulty of Paying Living Expenses: Not hard at all  Food Insecurity: No Food Insecurity   Worried About Charity fundraiser in the Last Year: Never true   [place] in the Last Year: Never true  Transportation Needs: No Transportation Needs   Lack of Transportation (Medical): No   Lack of Transportation (Non-Medical): No  Physical Activity: Insufficiently Active   Days of Exercise per Week: 6 days   Minutes of Exercise per Session: 20 min  Stress: No Stress Concern Present   Feeling of Stress : Not at all  Social Connections: Moderately Isolated   Frequency of Communication with Friends and Family: Three times a week   Frequency of Social Gatherings with Friends and Family: Twice a week   Attends Religious Services: Never   Marine scientist or Organizations: No   Attends Music therapist: Never   Marital Status: Married  Human resources officer Violence: Not At Risk   Fear of Current or Ex-Partner: No   Emotionally Abused: No   Physically Abused: No   Sexually Abused: No     Current Outpatient  Medications:    amLODipine (NORVASC) 2.5 MG tablet, Take 1 tablet (2.5 mg total) by mouth daily., Disp: 30 tablet, Rfl: 4   levothyroxine (SYNTHROID) 100 MCG tablet, Take 1 tablet (100 mcg total) by mouth daily., Disp: 90 tablet, Rfl: 3   lidocaine-prilocaine (EMLA) cream, Apply 1 application topically as needed. Apply small amount to port site at least 1 hour prior to it being accessed, cover with plastic wrap, Disp: 30 g, Rfl: 1   ondansetron (ZOFRAN) 8 MG tablet, Take 1 tablet (8 mg total) by mouth every 8 (eight) hours as needed for nausea or vomiting., Disp: 60 tablet, Rfl: 1   Allergies  Allergen Reactions   Omeprazole Rash    ROS Review of Systems   Respiratory:  Negative for cough, choking and wheezing.   Cardiovascular:  Negative for chest pain, palpitations and leg swelling.  Gastrointestinal:  Negative for abdominal pain.     Objective:    Physical Exam Constitutional:      Appearance: She is ill-appearing.  HENT:     Head: Normocephalic.     Nose: Nose normal.     Mouth/Throat:     Mouth: Mucous membranes are moist.  Cardiovascular:     Rate and Rhythm: Normal rate.  Pulmonary:     Effort: Pulmonary effort is normal.  Abdominal:     General: Bowel sounds are normal.  Neurological:     Mental Status: She is alert.    BP (!) 144/74    Pulse 82    Ht 5\' 7"  (1.702 m)    Wt 117 lb 9.6 oz (53.3 kg)    BMI 18.42 kg/m  Wt Readings from Last 3 Encounters:  06/06/21 117 lb 9.6 oz (53.3 kg)  06/03/21 118 lb 6.4 oz (53.7 kg)  05/20/21 117 lb 6.4 oz (53.3 kg)     Health Maintenance Due  Topic Date Due   Hepatitis C Screening  Never done   TETANUS/TDAP  Never done   Zoster Vaccines- Shingrix (1 of 2) Never done   Pneumonia Vaccine 88+ Years old (1 - PCV) Never done   COVID-19 Vaccine (3 - Pfizer risk series) 08/04/2019    There are no preventive care reminders to display for this patient.  Lab Results  Component Value Date   TSH 11.984 (H) 04/08/2021   Lab Results  Component Value Date   WBC 3.5 (L) 06/03/2021   HGB 9.2 (L) 06/03/2021   HCT 27.8 (L) 06/03/2021   MCV 106.1 (H) 06/03/2021   PLT 111 (L) 06/03/2021   Lab Results  Component Value Date   NA 132 (L) 06/03/2021   K 3.7 06/03/2021   CO2 23 06/03/2021   GLUCOSE 110 (H) 06/03/2021   BUN 17 06/03/2021   CREATININE 0.72 06/03/2021   BILITOT 0.3 06/03/2021   ALKPHOS 51 06/03/2021   AST 19 06/03/2021   ALT 10 06/03/2021   PROT 6.6 06/03/2021   ALBUMIN 3.6 06/03/2021   CALCIUM 9.5 06/03/2021   ANIONGAP 7 06/03/2021   No results found for: CHOL No results found for: HDL No results found for: LDLCALC No results found for: TRIG No results  found for: CHOLHDL No results found for: HGBA1C    Assessment & Plan:   Problem List Items Addressed This Visit       Endocrine   Malignant neoplasm of ovary [facility])    Patient is getting chemotherapy      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   Follow-up: No follow-ups on file.    Cletis Athens, MD

## 2021-06-06 NOTE — Assessment & Plan Note (Signed)
Patient is a on levothyroxine

## 2021-06-06 NOTE — Assessment & Plan Note (Signed)
Stable at the present time. 

## 2021-06-17 ENCOUNTER — Ambulatory Visit: Payer: Medicare Other

## 2021-06-17 ENCOUNTER — Other Ambulatory Visit: Payer: Medicare Other

## 2021-06-21 ENCOUNTER — Ambulatory Visit: Payer: Medicare Other | Admitting: Internal Medicine

## 2021-07-01 ENCOUNTER — Inpatient Hospital Stay: Payer: Medicare Other

## 2021-07-01 ENCOUNTER — Other Ambulatory Visit: Payer: Self-pay

## 2021-07-01 ENCOUNTER — Inpatient Hospital Stay: Payer: Medicare Other | Attending: Oncology | Admitting: Oncology

## 2021-07-01 ENCOUNTER — Encounter: Payer: Self-pay | Admitting: Oncology

## 2021-07-01 VITALS — BP 163/86 | HR 72 | Temp 97.5°F | Resp 18 | Wt 114.7 lb

## 2021-07-01 DIAGNOSIS — E039 Hypothyroidism, unspecified: Secondary | ICD-10-CM | POA: Insufficient documentation

## 2021-07-01 DIAGNOSIS — C569 Malignant neoplasm of unspecified ovary: Secondary | ICD-10-CM

## 2021-07-01 DIAGNOSIS — Z5112 Encounter for antineoplastic immunotherapy: Secondary | ICD-10-CM | POA: Diagnosis not present

## 2021-07-01 DIAGNOSIS — I10 Essential (primary) hypertension: Secondary | ICD-10-CM | POA: Diagnosis not present

## 2021-07-01 DIAGNOSIS — T451X5A Adverse effect of antineoplastic and immunosuppressive drugs, initial encounter: Secondary | ICD-10-CM | POA: Diagnosis not present

## 2021-07-01 DIAGNOSIS — Z5111 Encounter for antineoplastic chemotherapy: Secondary | ICD-10-CM

## 2021-07-01 DIAGNOSIS — D701 Agranulocytosis secondary to cancer chemotherapy: Secondary | ICD-10-CM | POA: Diagnosis not present

## 2021-07-01 DIAGNOSIS — D6481 Anemia due to antineoplastic chemotherapy: Secondary | ICD-10-CM | POA: Insufficient documentation

## 2021-07-01 LAB — CBC WITH DIFFERENTIAL/PLATELET
Abs Immature Granulocytes: 0.01 10*3/uL (ref 0.00–0.07)
Basophils Absolute: 0 10*3/uL (ref 0.0–0.1)
Basophils Relative: 1 %
Eosinophils Absolute: 0.1 10*3/uL (ref 0.0–0.5)
Eosinophils Relative: 2 %
HCT: 30.6 % — ABNORMAL LOW (ref 36.0–46.0)
Hemoglobin: 10.2 g/dL — ABNORMAL LOW (ref 12.0–15.0)
Immature Granulocytes: 0 %
Lymphocytes Relative: 26 %
Lymphs Abs: 1 10*3/uL (ref 0.7–4.0)
MCH: 35.2 pg — ABNORMAL HIGH (ref 26.0–34.0)
MCHC: 33.3 g/dL (ref 30.0–36.0)
MCV: 105.5 fL — ABNORMAL HIGH (ref 80.0–100.0)
Monocytes Absolute: 0.6 10*3/uL (ref 0.1–1.0)
Monocytes Relative: 15 %
Neutro Abs: 2.2 10*3/uL (ref 1.7–7.7)
Neutrophils Relative %: 56 %
Platelets: 159 10*3/uL (ref 150–400)
RBC: 2.9 MIL/uL — ABNORMAL LOW (ref 3.87–5.11)
RDW: 14.6 % (ref 11.5–15.5)
WBC: 3.9 10*3/uL — ABNORMAL LOW (ref 4.0–10.5)
nRBC: 0 % (ref 0.0–0.2)

## 2021-07-01 LAB — COMPREHENSIVE METABOLIC PANEL
ALT: 8 U/L (ref 0–44)
AST: 16 U/L (ref 15–41)
Albumin: 4.1 g/dL (ref 3.5–5.0)
Alkaline Phosphatase: 52 U/L (ref 38–126)
Anion gap: 6 (ref 5–15)
BUN: 12 mg/dL (ref 8–23)
CO2: 23 mmol/L (ref 22–32)
Calcium: 9.5 mg/dL (ref 8.9–10.3)
Chloride: 101 mmol/L (ref 98–111)
Creatinine, Ser: 0.69 mg/dL (ref 0.44–1.00)
GFR, Estimated: >60 mL/min (ref >60)
Glucose, Bld: 91 mg/dL (ref 70–99)
Potassium: 4 mmol/L (ref 3.5–5.1)
Sodium: 130 mmol/L — ABNORMAL LOW (ref 135–145)
Total Bilirubin: 0.5 mg/dL (ref 0.3–1.2)
Total Protein: 7 g/dL (ref 6.5–8.1)

## 2021-07-01 LAB — PROTEIN, URINE, RANDOM: Total Protein, Urine: 68 mg/dL

## 2021-07-01 MED ORDER — DOXORUBICIN HCL LIPOSOMAL CHEMO INJECTION 2 MG/ML
40.0000 mg/m2 | Freq: Once | INTRAVENOUS | Status: AC
  Administered 2021-07-01: 64 mg via INTRAVENOUS
  Filled 2021-07-01: qty 25

## 2021-07-01 MED ORDER — SODIUM CHLORIDE 0.9 % IV SOLN
10.0000 mg/kg | Freq: Once | INTRAVENOUS | Status: AC
  Administered 2021-07-01: 500 mg via INTRAVENOUS
  Filled 2021-07-01: qty 16

## 2021-07-01 MED ORDER — AMLODIPINE BESYLATE 5 MG PO TABS: 5.0000 mg | ORAL_TABLET | Freq: Every day | ORAL | 0 refills | Status: DC

## 2021-07-01 MED ORDER — PEGFILGRASTIM 6 MG/0.6ML ~~LOC~~ PSKT
6.0000 mg | PREFILLED_SYRINGE | Freq: Once | SUBCUTANEOUS | Status: AC
  Administered 2021-07-01: 6 mg via SUBCUTANEOUS
  Filled 2021-07-01: qty 0.6

## 2021-07-01 MED ORDER — DEXTROSE 5 % IV SOLN
Freq: Once | INTRAVENOUS | Status: AC
  Filled 2021-07-01: qty 250

## 2021-07-01 MED ORDER — SODIUM CHLORIDE 0.9 % IV SOLN
Freq: Once | INTRAVENOUS | Status: AC
  Filled 2021-07-01: qty 250

## 2021-07-01 MED ORDER — HEPARIN SOD (PORK) LOCK FLUSH 100 UNIT/ML IV SOLN
500.0000 [IU] | Freq: Once | INTRAVENOUS | Status: AC | PRN
  Administered 2021-07-01: 500 [IU]
  Filled 2021-07-01: qty 5

## 2021-07-01 MED ORDER — DEXAMETHASONE SODIUM PHOSPHATE 10 MG/ML IJ SOLN
5.0000 mg | Freq: Once | INTRAMUSCULAR | Status: AC
  Administered 2021-07-01: 5 mg via INTRAVENOUS
  Filled 2021-07-01: qty 1

## 2021-07-01 NOTE — Progress Notes (Signed)
Hematology/Oncology Progress  note ?[phone]  ?   ? ?Patient Care Team: ?Cletis Athens, MD as PCP - General (Internal Medicine) ?Clent Jacks, RN as Registered Nurse ?Gillis Ends, MD as Referring Physician (Obstetrics and Gynecology) ?Earlie Server, MD as Consulting Physician (Oncology) ?Gae Dry, MD as Referring Physician (Obstetrics and Gynecology) ? ?CHIEF COMPLAINTS/PURPOSE OF Visit ?Follow up for chemotherapy for ovarian cancer. ? ?HISTORY OF PRESENTING ILLNESS: ?Katie Hill 71 y.o. female presents for follow up of management of stage IIIC ovarian cancer. She underwent  Neoadjuvant chemotherapy of carbo and taxol x 4  ?# Patient had debulking surgery on 03/14/2017. She had a laparoscopy with conversion to laparotomy, total hysterectomy, with bilateral salpingo oophorectomy, right aortic lymph node dissection, omentectomy. Pathology showed small foci of residual disease in ovary.  ? ?Genetic testing negative for 83 genes on [facility]'s Multi-Cancer panel (ALK, APC, ATM, AXIN2, BAP1, BARD1, BLM, BMPR1A, BRCA1, BRCA2, BRIP1, CASR, CDC73, CDH1, CDK4, CDKN1B, CDKN1C, CDKN2A, CEBPA, CHEK2, CTNNA1, DICER1, DIS3L2, EGFR, EPCAM, FH, FLCN, GATA2, GPC3, GREM1, HOXB13, HRAS, KIT, MAX, MEN1, MET, MITF, MLH1, MSH2, MSH3, MSH6, MUTYH, NBN, NF1, NF2, NTHL1, PALB2, PDGFRA, PHOX2B, PMS2, POLD1, POLE, POT1, PRKAR1A, PTCH1, PTEN, RAD50, RAD51C, RAD51D, RB1, RECQL4, RET, RUNX1, SDHA, SDHAF2, SDHB, SDHC, SDHD, SMAD4, SMARCA4, SMARCB1, SMARCE1, STK11, SUFU, TERC, TERT, TMEM127, TP53, TSC1, TSC2, VHL, WRN, WT1). ?  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  ?Myraid testing negative for somatic BRACA1/2, positive for HRD.  ? ?# HRD positive, was referred to [facility] for clinical trials of Olarparib maintenance. She opted out.  ?#  ?Treatment:  ?Stage IIIC Ovarian cancer:  ?#s/p Carboplatin and taxol x 4 neoadjuvant chemotherapy followed by debulking surgery  03/14/2017 ?# 03/28/2017 S/p Adjuvant carbo and taxol x3  ? ?Local recurrence, CA125 one 46.3 ?03/15/2018-05/17/2018 Carboplatin and Taxol x 4. CA125 decrease from 49.7 to 6 after 4 cycles of treatment.  ?Started on Olarparib 310m BID on 05/17/2018. ? ?# was seen by Dr. BFransisca Connorsfor further evaluation due to rising Ca1 25 and MRI abdomenPelvis on 11/05/2018 showed Interval progression of, now measuring 2.7 x 2.3 cm and concerning for progression of metastatic disease.retrocaval lymph node in the abdomen ?Olaparib was held for short period of time. ?Her CA125 trended down again.  Dr. BFransisca Connorsrecommending resume olaparib and continue monitor. If she has more significant progression of disease she may be a candidate for clinical trial or will be treated with other chemotherapy drugs including Doxil, gemcitabine and Avastin. ? ?#10/06/2020, CT chest abdomen pelvis with contrast showed new lymph nodes in the prevascular space of the upper anterior mediastinum most consistent with metastasis.  Single new right lower lobe pulmonary nodule is concerning for early pulmonary metastasis.  Interval enlargement of the left periaortic retroperitoneal lymph node.  Stable adjacent aorto caval adenopathy. ? ?10/29/2020 MUGA LVEF normal ?10/29/2020 Stopped Olarparib  ?11/03/2020 cycle 1 Doxil + bevacizumab ?01/26/2021, CT chest abdomen pelvis-partial response-decrease of lung nodule.  Unchanged lymphadenopathy. ?01/14/21 Echocardiogram -LVEF 55-60% ?05/16/2021, CT chest abdomen pelvis with contrast showed a slightly decreased size of lymph nodes in the mediastinum and upper left retroperitoneum.  Previous noted a soft tissue attenuation lesion in the posterolateral aspect of the right hemipelvis, stable  ? ?06/03/2021, cycle 8 chemotherapy was Doxil only without bevacizumab due to proteinuria. ? ?#hypothyroidism, Synthroid was increased by PCP to 100 MCG daily.   ?I would defer further management to primary care provider ? ?INTERVAL HISTORY ?71 y.o. female with  above oncology history reviewed by me today presents for evaluation for chemotherapy for treatment of ovarian cancer  ?Patient was accompanied by her husband.   ?Patient reports feeling well.  She measured blood pressure at home today.  This morning SBP was 150 ?In the clinic BP was elevated at 163/86.  Patient takes Norvasc 2.5 mg daily.  Patient denies any new complaints ?Her appetite is fair.  She has lost 3 pounds since last visit. ?. ?Review of Systems  ?Constitutional:  Positive for fatigue. Negative for appetite change, chills and fever.  ?HENT:   Negative for hearing loss and voice change.   ?Eyes:  Negative for eye problems.  ?Respiratory:  Negative for chest tightness and cough.   ?Cardiovascular:  Negative for chest pain.  ?Gastrointestinal:  Negative for abdominal distention, abdominal pain and blood in stool.  ?Endocrine: Negative for hot flashes.  ?Genitourinary:  Negative for difficulty urinating and frequency.   ?Musculoskeletal:  Negative for arthralgias.  ?Skin:  Negative for itching and rash.  ?Neurological:  Negative for extremity weakness.  ?Hematological:  Negative for adenopathy.  ?Psychiatric/Behavioral:  Negative for confusion.   ? ? ?. ?MEDICAL HISTORY: ?Past Medical History:  ?Diagnosis Date  ? Dysrhythmia   ? Genetic testing 03/28/2017  ? Multi-Cancer panel (83 genes) @ [facility] - No pathogenic mutations detected  ? 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 Pelvic mass in female   ? ? ?SURGICAL HISTORY: ?Past Surgical History:  ?Procedure Laterality Date  ? APPENDECTOMY    ? LAPAROSCOPY N/A 03/14/2017  ? Procedure: LAPAROSCOPY OPERATIVE;  Surgeon: Mellody Drown, MD;  Location: [facility];  Service: Gynecology;  Laterality: N/A;  ? LAPAROTOMY N/A 03/14/2017  ? Procedure: LAPAROTOMY;  Surgeon: Mellody Drown, MD;  Location: [facility];  Service: Gynecology;  Laterality: N/A;  ? LYMPH NODE DISSECTION N/A 03/14/2017  ? Procedure: LYMPH NODE DISSECTION;  Surgeon: Mellody Drown, MD;  Location: [facility];  Service: Gynecology;  Laterality: N/A;  ? OMENTECTOMY N/A 03/14/2017  ? Procedure: OMENTECTOMY;  Surgeon: Mellody Drown, MD;  Location: [facility];  Service: Gynecology;  Laterality: N/A;  ? PORTA CATH INSERTION N/A 11/27/2016  ? Procedure: Porta Cath Insertion;  Surgeon: Algernon Huxley, MD;  Location: [facility];  Service: Cardiovascular;  Laterality: N/A;  ? ? ?SOCIAL HISTORY: ?Social History  ? ?Tobacco Use  ? Smoking status: Never  ? Smokeless tobacco: Never  ?Vaping Use  ? Vaping Use: Never used  ?Substance Use Topics  ? Alcohol use: Not Currently  ? Drug use: No  ? ? ? ?FAMILY HISTORY ?Family History  ?Problem Relation Age of Onset  ? Throat cancer Cousin   ? Throat cancer Cousin   ? Leukemia Cousin   ? ? ?ALLERGIES:  is allergic to omeprazole. ? ?MEDICATIONS:  ?Current Outpatient Medications  ?Medication Sig Dispense Refill  ? amLODipine (NORVASC) 5 MG tablet Take 1 tablet (5 mg total) by mouth daily. 30 tablet 0  ? levothyroxine (SYNTHROID) 100 MCG tablet Take 1 tablet (100 mcg total) by mouth daily. 90 tablet 3  ? lidocaine-prilocaine (EMLA) cream Apply 1 application topically as needed. Apply small amount to port site at least 1 hour prior to it being accessed, cover with plastic wrap 30 g 1  ? ondansetron (ZOFRAN) 8 MG tablet Take 1 tablet (8 mg total) by mouth every 8 (eight) hours as needed for nausea or vomiting. 60 tablet 1  ? ?No current facility-administered medications for this visit.  ? ? ?  PHYSICAL EXAMINATION: ? ?ECOG PERFORMANCE STATUS: 0 - Asymptomatic ?Vitals:  ? 07/01/21 0856  ?BP: (!) 163/86  ?Pulse: 72  ?Resp: 18  ?Temp: (!) 97.5 ?F (36.4 ?C)  ? ? ?Filed Weights  ? 07/01/21 0856  ?Weight: 114 lb 11.2 oz (52 kg)  ? ? ? ?Physical Exam ?Constitutional:   ?   General: She is not in acute distress. ?   Appearance: She is not diaphoretic.  ?   Comments: Thin built, she walks independantly  ?HENT:  ?   Head: Normocephalic and atraumatic.  ?   Nose: Nose  normal.  ?   Mouth/Throat:  ?   Pharynx: No oropharyngeal exudate.  ?Eyes:  ?   General: No scleral icterus. ?   Pupils: Pupils are equal, round, and reactive to light.  ?Neck:  ?   Vascular: No JVD.  ?Cardiovascul

## 2021-07-01 NOTE — Progress Notes (Signed)
Pt here for follow up. No new concerns voiced.   

## 2021-07-02 LAB — CA 125: Cancer Antigen (CA) 125: 16.2 U/mL (ref 0.0–38.1)

## 2021-07-05 ENCOUNTER — Encounter: Payer: Self-pay | Admitting: Internal Medicine

## 2021-07-05 ENCOUNTER — Other Ambulatory Visit: Payer: Self-pay

## 2021-07-05 ENCOUNTER — Ambulatory Visit (INDEPENDENT_AMBULATORY_CARE_PROVIDER_SITE_OTHER): Payer: Medicare Other | Admitting: Internal Medicine

## 2021-07-05 VITALS — BP 152/78 | HR 78 | Ht 67.0 in | Wt 116.1 lb

## 2021-07-05 DIAGNOSIS — E063 Autoimmune thyroiditis: Secondary | ICD-10-CM

## 2021-07-05 DIAGNOSIS — C569 Malignant neoplasm of unspecified ovary: Secondary | ICD-10-CM | POA: Diagnosis not present

## 2021-07-05 DIAGNOSIS — Z90710 Acquired absence of both cervix and uterus: Secondary | ICD-10-CM | POA: Diagnosis not present

## 2021-07-05 DIAGNOSIS — R0989 Other specified symptoms and signs involving the circulatory and respiratory systems: Secondary | ICD-10-CM

## 2021-07-05 DIAGNOSIS — Z9079 Acquired absence of other genital organ(s): Secondary | ICD-10-CM | POA: Diagnosis not present

## 2021-07-05 DIAGNOSIS — Z90722 Acquired absence of ovaries, bilateral: Secondary | ICD-10-CM | POA: Diagnosis not present

## 2021-07-05 DIAGNOSIS — D649 Anemia, unspecified: Secondary | ICD-10-CM

## 2021-07-05 DIAGNOSIS — E038 Other specified hypothyroidism: Secondary | ICD-10-CM

## 2021-07-05 NOTE — Addendum Note (Signed)
Addended by: Alois Cliche on: 07/05/2021 03:21 PM ? ? Modules accepted: Orders ? ?

## 2021-07-05 NOTE — Assessment & Plan Note (Signed)
Patient is on chemo ?

## 2021-07-05 NOTE — Assessment & Plan Note (Signed)
Refer to oncology

## 2021-07-05 NOTE — Assessment & Plan Note (Signed)
Will refer to Dr. Lucky Cowboy ?

## 2021-07-05 NOTE — Assessment & Plan Note (Signed)
Patient is on chemotherapy ?

## 2021-07-05 NOTE — Assessment & Plan Note (Signed)
We will check TSH 

## 2021-07-05 NOTE — Progress Notes (Signed)
? ?Established Patient Office Visit ? ?Subjective:  ?Patient ID: Katie Hill, female    DOB: 03-21-1951  Age: 71 y.o. MRN: [id number] ? ?CC:  ?Chief Complaint  ?Patient presents with  ? Follow-up  ? ? ?HPI ? ?Katie Hill presents for general check up ? ?Past Medical History:  ?Diagnosis Date  ? Dysrhythmia   ? Genetic testing 03/28/2017  ? Multi-Cancer panel (83 genes) @ [facility] - No pathogenic mutations detected  ? 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 Pelvic mass in female   ? ? ?Past Surgical History:  ?Procedure Laterality Date  ? APPENDECTOMY    ? LAPAROSCOPY N/A 03/14/2017  ? Procedure: LAPAROSCOPY OPERATIVE;  Surgeon: Mellody Drown, MD;  Location: [facility];  Service: Gynecology;  Laterality: N/A;  ? LAPAROTOMY N/A 03/14/2017  ? Procedure: LAPAROTOMY;  Surgeon: Mellody Drown, MD;  Location: [facility];  Service: Gynecology;  Laterality: N/A;  ? LYMPH NODE DISSECTION N/A 03/14/2017  ? Procedure: LYMPH NODE DISSECTION;  Surgeon: Mellody Drown, MD;  Location: [facility];  Service: Gynecology;  Laterality: N/A;  ? OMENTECTOMY N/A 03/14/2017  ? Procedure: OMENTECTOMY;  Surgeon: Mellody Drown, MD;  Location: [facility];  Service: Gynecology;  Laterality: N/A;  ? PORTA CATH INSERTION N/A 11/27/2016  ? Procedure: Porta Cath Insertion;  Surgeon: Algernon Huxley, MD;  Location: [facility];  Service: Cardiovascular;  Laterality: N/A;  ? ? ?Family History  ?Problem Relation Age of Onset  ? Throat cancer Cousin   ? Throat cancer Cousin   ? Leukemia Cousin   ? ? ?Social History  ? ?Socioeconomic History  ? Marital status: Married  ?  Spouse name: Not on file  ? Number of children: Not on file  ? Years of education: Not on file  ? Highest education level: Not on file  ?Occupational History  ? Not on file  ?Tobacco Use  ? Smoking status: Never  ? Smokeless tobacco: Never  ?Vaping Use  ? Vaping Use: Never used  ?Substance and Sexual Activity  ? Alcohol use: Not Currently  ? Drug use: No  ? Sexual  activity: Yes  ?  Birth control/protection: Post-menopausal  ?Other Topics Concern  ? Not on file  ?Social History Narrative  ? Not on file  ? ?Social Determinants of Health  ? ?Financial Resource Strain: Low Risk   ? Difficulty of Paying Living Expenses: Not hard at all  ?Food Insecurity: No Food Insecurity  ? Worried About Charity fundraiser in the Last Year: Never true  ? Ran Out of Food in the Last Year: Never true  ?Transportation Needs: No Transportation Needs  ? Lack of Transportation (Medical): No  ? Lack of Transportation (Non-Medical): No  ?Physical Activity: Insufficiently Active  ? Days of Exercise per Week: 6 days  ? Minutes of Exercise per Session: 20 min  ?Stress: No Stress Concern Present  ? Feeling of Stress : Not at all  ?Social Connections: Moderately Isolated  ? Frequency of Communication with Friends and Family: Three times a week  ? Frequency of Social Gatherings with Friends and Family: Twice a week  ? Attends Religious Services: Never  ? Active Member of Clubs or Organizations: No  ? Attends Archivist Meetings: Never  ? Marital Status: Married  ?Intimate Partner Violence: Not At Risk  ? Fear of Current or Ex-Partner: No  ? Emotionally Abused: No  ? Physically Abused: No  ? Sexually Abused: No  ? ? ? ?Current  Outpatient Medications:  ?  amLODipine (NORVASC) 10 MG tablet, Take 10 mg by mouth daily., Disp: , Rfl:  ?  levothyroxine (SYNTHROID) 100 MCG tablet, Take 1 tablet (100 mcg total) by mouth daily., Disp: 90 tablet, Rfl: 3 ?  lidocaine-prilocaine (EMLA) cream, Apply 1 application topically as needed. Apply small amount to port site at least 1 hour prior to it being accessed, cover with plastic wrap, Disp: 30 g, Rfl: 1 ?  ondansetron (ZOFRAN) 8 MG tablet, Take 1 tablet (8 mg total) by mouth every 8 (eight) hours as needed for nausea or vomiting., Disp: 60 tablet, Rfl: 1  ? ?Allergies  ?Allergen Reactions  ? Omeprazole Rash  ? ? ?ROS ?Review of Systems  ?Constitutional: Negative.    ?HENT: Negative.  Negative for congestion.   ?Eyes: Negative.   ?Respiratory: Negative.  Negative for choking, chest tightness and stridor.   ?Cardiovascular: Negative.   ?Gastrointestinal: Negative.   ?Endocrine: Negative.  Negative for polydipsia.  ?Genitourinary: Negative.  Negative for flank pain.  ?Musculoskeletal: Negative.   ?Skin: Negative.   ?Allergic/Immunologic: Negative.   ?Neurological: Negative.  Negative for headaches.  ?Hematological: Negative.   ?     Anemia  ?Psychiatric/Behavioral: Negative.    ?All other systems reviewed and are negative. ? ?  ?Objective:  ?  ?Physical Exam ?Constitutional:   ?   Appearance: Normal appearance.  ?HENT:  ?   Head: Normocephalic.  ?   Nose: Nose normal.  ?   Mouth/Throat:  ?   Mouth: Mucous membranes are moist.  ?Eyes:  ?   Pupils: Pupils are equal, round, and reactive to light.  ?Neck:  ?   Comments: Lt carotid  bruit ?Cardiovascular:  ?   Rate and Rhythm: Normal rate and regular rhythm.  ?Skin: ?   Coloration: Skin is not jaundiced.  ?Neurological:  ?   General: No focal deficit present.  ?   Mental Status: She is alert. Mental status is at baseline.  ?Psychiatric:     ?   Mood and Affect: Mood normal.  ? ? ?BP (!) 152/78   Pulse 78   Ht '5\' 7"'$  (1.702 m)   Wt 116 lb 1.6 oz (52.7 kg)   BMI 18.18 kg/m?  ?Wt Readings from Last 3 Encounters:  ?07/05/21 116 lb 1.6 oz (52.7 kg)  ?07/01/21 114 lb 11.2 oz (52 kg)  ?06/06/21 117 lb 9.6 oz (53.3 kg)  ? ? ? ?Health Maintenance Due  ?Topic Date Due  ? Hepatitis C Screening  Never done  ? TETANUS/TDAP  Never done  ? Zoster Vaccines- Shingrix (1 of 2) Never done  ? Pneumonia Vaccine 38+ Years old (1 - PCV) Never done  ? COVID-19 Vaccine (3 - Pfizer risk series) 08/04/2019  ? ? ?There are no preventive care reminders to display for this patient. ? ?Lab Results  ?Component Value Date  ? TSH 11.984 (H) 04/08/2021  ? ?Lab Results  ?Component Value Date  ? WBC 3.9 (L) 07/01/2021  ? HGB 10.2 (L) 07/01/2021  ? HCT 30.6 (L)  07/01/2021  ? MCV 105.5 (H) 07/01/2021  ? PLT 159 07/01/2021  ? ?Lab Results  ?Component Value Date  ? NA 130 (L) 07/01/2021  ? K 4.0 07/01/2021  ? CO2 23 07/01/2021  ? GLUCOSE 91 07/01/2021  ? BUN 12 07/01/2021  ? CREATININE 0.69 07/01/2021  ? BILITOT 0.5 07/01/2021  ? ALKPHOS 52 07/01/2021  ? AST 16 07/01/2021  ? ALT 8 07/01/2021  ? PROT 7.0  07/01/2021  ? ALBUMIN 4.1 07/01/2021  ? CALCIUM 9.5 07/01/2021  ? ANIONGAP 6 07/01/2021  ? ?No results found for: CHOL ?No results found for: HDL ?No results found for: [place] ?No results found for: TRIG ?No results found for: CHOLHDL ?No results found for: HGBA1C ? ?  ?Assessment & Plan:  ? ?Problem List Items Addressed This Visit   ? ?  ? Endocrine  ? 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  Patient is on chemotherapy ?  ?  ? Hypothyroidism due to Hashimoto's thyroiditis  ?  We will check TSH ?  ?  ?  ? Other  ? S/P total abdominal hysterectomy and bilateral salpingo-oophorectomy  ?  Patient is on chemo ?  ?  ? Anemia  ?  Refer to oncology ?  ?  ? Left carotid bruit  ?  Will refer to Dr. Lucky Cowboy ?  ?  ? ? ?No orders of the defined types were placed in this encounter. ? ? ?Follow-up: No follow-ups on file.  ? ? ?Cletis Athens, MD ?

## 2021-07-15 ENCOUNTER — Inpatient Hospital Stay: Payer: Medicare Other

## 2021-07-15 ENCOUNTER — Inpatient Hospital Stay: Payer: Medicare Other | Attending: Oncology

## 2021-07-15 VITALS — BP 168/85 | HR 73 | Temp 96.9°F | Resp 18 | Wt 114.6 lb

## 2021-07-15 DIAGNOSIS — I10 Essential (primary) hypertension: Secondary | ICD-10-CM | POA: Diagnosis not present

## 2021-07-15 DIAGNOSIS — C569 Malignant neoplasm of unspecified ovary: Secondary | ICD-10-CM

## 2021-07-15 DIAGNOSIS — R808 Other proteinuria: Secondary | ICD-10-CM | POA: Insufficient documentation

## 2021-07-15 DIAGNOSIS — E871 Hypo-osmolality and hyponatremia: Secondary | ICD-10-CM | POA: Insufficient documentation

## 2021-07-15 DIAGNOSIS — Z5112 Encounter for antineoplastic immunotherapy: Secondary | ICD-10-CM | POA: Diagnosis not present

## 2021-07-15 DIAGNOSIS — D701 Agranulocytosis secondary to cancer chemotherapy: Secondary | ICD-10-CM | POA: Diagnosis not present

## 2021-07-15 DIAGNOSIS — E039 Hypothyroidism, unspecified: Secondary | ICD-10-CM | POA: Insufficient documentation

## 2021-07-15 DIAGNOSIS — D6481 Anemia due to antineoplastic chemotherapy: Secondary | ICD-10-CM | POA: Insufficient documentation

## 2021-07-15 DIAGNOSIS — Z95828 Presence of other vascular implants and grafts: Secondary | ICD-10-CM

## 2021-07-15 LAB — COMPREHENSIVE METABOLIC PANEL
ALT: 13 U/L (ref 0–44)
AST: 19 U/L (ref 15–41)
Albumin: 3.9 g/dL (ref 3.5–5.0)
Alkaline Phosphatase: 70 U/L (ref 38–126)
Anion gap: 5 (ref 5–15)
BUN: 16 mg/dL (ref 8–23)
CO2: 23 mmol/L (ref 22–32)
Calcium: 9.4 mg/dL (ref 8.9–10.3)
Chloride: 102 mmol/L (ref 98–111)
Creatinine, Ser: 0.62 mg/dL (ref 0.44–1.00)
GFR, Estimated: >60 mL/min (ref >60)
Glucose, Bld: 93 mg/dL (ref 70–99)
Potassium: 4 mmol/L (ref 3.5–5.1)
Sodium: 130 mmol/L — ABNORMAL LOW (ref 135–145)
Total Bilirubin: 0.6 mg/dL (ref 0.3–1.2)
Total Protein: 6.8 g/dL (ref 6.5–8.1)

## 2021-07-15 LAB — CBC WITH DIFFERENTIAL/PLATELET
Abs Immature Granulocytes: 0.09 10*3/uL — ABNORMAL HIGH (ref 0.00–0.07)
Basophils Absolute: 0.1 10*3/uL (ref 0.0–0.1)
Basophils Relative: 1 %
Eosinophils Absolute: 0.1 10*3/uL (ref 0.0–0.5)
Eosinophils Relative: 3 %
HCT: 27.4 % — ABNORMAL LOW (ref 36.0–46.0)
Hemoglobin: 9.1 g/dL — ABNORMAL LOW (ref 12.0–15.0)
Immature Granulocytes: 2 %
Lymphocytes Relative: 21 %
Lymphs Abs: 1.1 10*3/uL (ref 0.7–4.0)
MCH: 35.4 pg — ABNORMAL HIGH (ref 26.0–34.0)
MCHC: 33.2 g/dL (ref 30.0–36.0)
MCV: 106.6 fL — ABNORMAL HIGH (ref 80.0–100.0)
Monocytes Absolute: 0.4 10*3/uL (ref 0.1–1.0)
Monocytes Relative: 8 %
Neutro Abs: 3.5 10*3/uL (ref 1.7–7.7)
Neutrophils Relative %: 65 %
Platelets: 103 10*3/uL — ABNORMAL LOW (ref 150–400)
RBC: 2.57 MIL/uL — ABNORMAL LOW (ref 3.87–5.11)
RDW: 14.7 % (ref 11.5–15.5)
WBC: 5.4 10*3/uL (ref 4.0–10.5)
nRBC: 0 % (ref 0.0–0.2)

## 2021-07-15 LAB — PROTEIN, URINE, RANDOM: Total Protein, Urine: 90 mg/dL

## 2021-07-15 MED ORDER — SODIUM CHLORIDE 0.9 % IV SOLN
10.0000 mg/kg | Freq: Once | INTRAVENOUS | Status: AC
  Administered 2021-07-15: 500 mg via INTRAVENOUS
  Filled 2021-07-15: qty 16

## 2021-07-15 MED ORDER — HEPARIN SOD (PORK) LOCK FLUSH 100 UNIT/ML IV SOLN
500.0000 [IU] | Freq: Once | INTRAVENOUS | Status: AC | PRN
  Administered 2021-07-15: 500 [IU]
  Filled 2021-07-15: qty 5

## 2021-07-15 MED ORDER — SODIUM CHLORIDE 0.9 % IV SOLN
Freq: Once | INTRAVENOUS | Status: AC
  Filled 2021-07-15: qty 250

## 2021-07-15 MED ORDER — SODIUM CHLORIDE 0.9% FLUSH
10.0000 mL | Freq: Once | INTRAVENOUS | Status: DC
  Filled 2021-07-15: qty 10

## 2021-07-29 ENCOUNTER — Inpatient Hospital Stay: Payer: Medicare Other

## 2021-07-29 ENCOUNTER — Encounter: Payer: Self-pay | Admitting: Oncology

## 2021-07-29 ENCOUNTER — Inpatient Hospital Stay (HOSPITAL_BASED_OUTPATIENT_CLINIC_OR_DEPARTMENT_OTHER): Payer: Medicare Other | Admitting: Oncology

## 2021-07-29 VITALS — BP 167/86 | HR 77 | Temp 96.9°F | Resp 18 | Wt 114.5 lb

## 2021-07-29 DIAGNOSIS — C569 Malignant neoplasm of unspecified ovary: Secondary | ICD-10-CM | POA: Diagnosis not present

## 2021-07-29 DIAGNOSIS — T451X5A Adverse effect of antineoplastic and immunosuppressive drugs, initial encounter: Secondary | ICD-10-CM | POA: Diagnosis not present

## 2021-07-29 DIAGNOSIS — R808 Other proteinuria: Secondary | ICD-10-CM | POA: Diagnosis not present

## 2021-07-29 DIAGNOSIS — Z5112 Encounter for antineoplastic immunotherapy: Secondary | ICD-10-CM | POA: Diagnosis not present

## 2021-07-29 DIAGNOSIS — D7589 Other specified diseases of blood and blood-forming organs: Secondary | ICD-10-CM | POA: Diagnosis not present

## 2021-07-29 DIAGNOSIS — I10 Essential (primary) hypertension: Secondary | ICD-10-CM

## 2021-07-29 DIAGNOSIS — Z95828 Presence of other vascular implants and grafts: Secondary | ICD-10-CM | POA: Diagnosis not present

## 2021-07-29 DIAGNOSIS — D6481 Anemia due to antineoplastic chemotherapy: Secondary | ICD-10-CM | POA: Diagnosis not present

## 2021-07-29 DIAGNOSIS — Z5111 Encounter for antineoplastic chemotherapy: Secondary | ICD-10-CM | POA: Diagnosis not present

## 2021-07-29 DIAGNOSIS — D701 Agranulocytosis secondary to cancer chemotherapy: Secondary | ICD-10-CM | POA: Diagnosis not present

## 2021-07-29 DIAGNOSIS — E039 Hypothyroidism, unspecified: Secondary | ICD-10-CM | POA: Diagnosis not present

## 2021-07-29 DIAGNOSIS — C561 Malignant neoplasm of right ovary: Secondary | ICD-10-CM | POA: Diagnosis not present

## 2021-07-29 DIAGNOSIS — E871 Hypo-osmolality and hyponatremia: Secondary | ICD-10-CM | POA: Diagnosis not present

## 2021-07-29 LAB — CBC WITH DIFFERENTIAL/PLATELET
Abs Immature Granulocytes: 0.02 10*3/uL (ref 0.00–0.07)
Basophils Absolute: 0.1 10*3/uL (ref 0.0–0.1)
Basophils Relative: 1 %
Eosinophils Absolute: 0.1 10*3/uL (ref 0.0–0.5)
Eosinophils Relative: 2 %
HCT: 28.4 % — ABNORMAL LOW (ref 36.0–46.0)
Hemoglobin: 9.3 g/dL — ABNORMAL LOW (ref 12.0–15.0)
Immature Granulocytes: 1 %
Lymphocytes Relative: 24 %
Lymphs Abs: 1.1 10*3/uL (ref 0.7–4.0)
MCH: 34.3 pg — ABNORMAL HIGH (ref 26.0–34.0)
MCHC: 32.7 g/dL (ref 30.0–36.0)
MCV: 104.8 fL — ABNORMAL HIGH (ref 80.0–100.0)
Monocytes Absolute: 0.6 10*3/uL (ref 0.1–1.0)
Monocytes Relative: 14 %
Neutro Abs: 2.5 10*3/uL (ref 1.7–7.7)
Neutrophils Relative %: 58 %
Platelets: 140 10*3/uL — ABNORMAL LOW (ref 150–400)
RBC: 2.71 MIL/uL — ABNORMAL LOW (ref 3.87–5.11)
RDW: 14.6 % (ref 11.5–15.5)
WBC: 4.4 10*3/uL (ref 4.0–10.5)
nRBC: 0 % (ref 0.0–0.2)

## 2021-07-29 LAB — COMPREHENSIVE METABOLIC PANEL
ALT: 9 U/L (ref 0–44)
AST: 15 U/L (ref 15–41)
Albumin: 3.9 g/dL (ref 3.5–5.0)
Alkaline Phosphatase: 58 U/L (ref 38–126)
Anion gap: 4 — ABNORMAL LOW (ref 5–15)
BUN: 17 mg/dL (ref 8–23)
CO2: 24 mmol/L (ref 22–32)
Calcium: 9.4 mg/dL (ref 8.9–10.3)
Chloride: 103 mmol/L (ref 98–111)
Creatinine, Ser: 0.64 mg/dL (ref 0.44–1.00)
GFR, Estimated: >60 mL/min (ref >60)
Glucose, Bld: 90 mg/dL (ref 70–99)
Potassium: 3.8 mmol/L (ref 3.5–5.1)
Sodium: 131 mmol/L — ABNORMAL LOW (ref 135–145)
Total Bilirubin: 0.7 mg/dL (ref 0.3–1.2)
Total Protein: 6.9 g/dL (ref 6.5–8.1)

## 2021-07-29 LAB — PROTEIN, URINE, RANDOM: Total Protein, Urine: 149 mg/dL

## 2021-07-29 MED ORDER — LOSARTAN POTASSIUM 25 MG PO TABS
25.0000 mg | ORAL_TABLET | Freq: Every day | ORAL | 0 refills | Status: DC
Start: 2021-07-29 — End: 2021-08-26

## 2021-07-29 MED ORDER — AMLODIPINE BESYLATE 5 MG PO TABS
5.0000 mg | ORAL_TABLET | Freq: Every day | ORAL | 1 refills | Status: DC
Start: 2021-07-29 — End: 2021-10-21

## 2021-07-29 MED ORDER — PEGFILGRASTIM 6 MG/0.6ML ~~LOC~~ PSKT
6.0000 mg | PREFILLED_SYRINGE | Freq: Once | SUBCUTANEOUS | Status: AC
  Administered 2021-07-29: 6 mg via SUBCUTANEOUS
  Filled 2021-07-29: qty 0.6

## 2021-07-29 MED ORDER — HEPARIN SOD (PORK) LOCK FLUSH 100 UNIT/ML IV SOLN
500.0000 [IU] | Freq: Once | INTRAVENOUS | Status: AC | PRN
  Administered 2021-07-29: 500 [IU]
  Filled 2021-07-29: qty 5

## 2021-07-29 MED ORDER — DEXTROSE 5 % IV SOLN
Freq: Once | INTRAVENOUS | Status: AC
  Filled 2021-07-29: qty 250

## 2021-07-29 MED ORDER — DOXORUBICIN HCL LIPOSOMAL CHEMO INJECTION 2 MG/ML
40.0000 mg/m2 | Freq: Once | INTRAVENOUS | Status: AC
  Administered 2021-07-29: 64 mg via INTRAVENOUS
  Filled 2021-07-29: qty 10

## 2021-07-29 NOTE — Progress Notes (Signed)
Per MD nurse patient will only get Doxil and onpro today. NO BEV. Pharmacy notified  ?

## 2021-07-29 NOTE — Progress Notes (Signed)
Hematology/Oncology Progress  note ?[phone]  ?   ? ?Patient Care Team: ?Cletis Athens, MD as PCP - General (Internal Medicine) ?Clent Jacks, RN as Registered Nurse ?Gillis Ends, MD as Referring Physician (Obstetrics and Gynecology) ?Earlie Server, MD as Consulting Physician (Oncology) ?Gae Dry, MD as Referring Physician (Obstetrics and Gynecology) ? ?CHIEF COMPLAINTS/PURPOSE OF Visit ?Follow up for chemotherapy for ovarian cancer. ? ?HISTORY OF PRESENTING ILLNESS: ?Katie Hill 71 y.o. female presents for follow up of management of stage IIIC ovarian cancer. She underwent  Neoadjuvant chemotherapy of carbo and taxol x 4  ?# Patient had debulking surgery on 03/14/2017. She had a laparoscopy with conversion to laparotomy, total hysterectomy, with bilateral salpingo oophorectomy, right aortic lymph node dissection, omentectomy. Pathology showed small foci of residual disease in ovary.  ? ?Genetic testing negative for 83 genes on [facility]'s Multi-Cancer panel (ALK, APC, ATM, AXIN2, BAP1, BARD1, BLM, BMPR1A, BRCA1, BRCA2, BRIP1, CASR, CDC73, CDH1, CDK4, CDKN1B, CDKN1C, CDKN2A, CEBPA, CHEK2, CTNNA1, DICER1, DIS3L2, EGFR, EPCAM, FH, FLCN, GATA2, GPC3, GREM1, HOXB13, HRAS, KIT, MAX, MEN1, MET, MITF, MLH1, MSH2, MSH3, MSH6, MUTYH, NBN, NF1, NF2, NTHL1, PALB2, PDGFRA, PHOX2B, PMS2, POLD1, POLE, POT1, PRKAR1A, PTCH1, PTEN, RAD50, RAD51C, RAD51D, RB1, RECQL4, RET, RUNX1, SDHA, SDHAF2, SDHB, SDHC, SDHD, SMAD4, SMARCA4, SMARCB1, SMARCE1, STK11, SUFU, TERC, TERT, TMEM127, TP53, TSC1, TSC2, VHL, WRN, WT1). ?  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  ?Myraid testing negative for somatic BRACA1/2, positive for HRD.  ? ?# HRD positive, was referred to [facility] for clinical trials of Olarparib maintenance. She opted out.  ?#  ?Treatment:  ?Stage IIIC Ovarian cancer:  ?#s/p Carboplatin and taxol x 4 neoadjuvant chemotherapy followed by debulking surgery  03/14/2017 ?# 03/28/2017 S/p Adjuvant carbo and taxol x3  ? ?Local recurrence, CA125 one 46.3 ?03/15/2018-05/17/2018 Carboplatin and Taxol x 4. CA125 decrease from 49.7 to 6 after 4 cycles of treatment.  ?Started on Olarparib 310m BID on 05/17/2018. ? ?# was seen by Dr. BFransisca Connorsfor further evaluation due to rising Ca1 25 and MRI abdomenPelvis on 11/05/2018 showed Interval progression of, now measuring 2.7 x 2.3 cm and concerning for progression of metastatic disease.retrocaval lymph node in the abdomen ?Olaparib was held for short period of time. ?Her CA125 trended down again.  Dr. BFransisca Connorsrecommending resume olaparib and continue monitor. If she has more significant progression of disease she may be a candidate for clinical trial or will be treated with other chemotherapy drugs including Doxil, gemcitabine and Avastin. ? ?#10/06/2020, CT chest abdomen pelvis with contrast showed new lymph nodes in the prevascular space of the upper anterior mediastinum most consistent with metastasis.  Single new right lower lobe pulmonary nodule is concerning for early pulmonary metastasis.  Interval enlargement of the left periaortic retroperitoneal lymph node.  Stable adjacent aorto caval adenopathy. ? ?10/29/2020 MUGA LVEF normal ?10/29/2020 Stopped Olarparib  ?11/03/2020 cycle 1 Doxil + bevacizumab ?01/26/2021, CT chest abdomen pelvis-partial response-decrease of lung nodule.  Unchanged lymphadenopathy. ?01/14/21 Echocardiogram -LVEF 55-60% ?05/16/2021, CT chest abdomen pelvis with contrast showed a slightly decreased size of lymph nodes in the mediastinum and upper left retroperitoneum.  Previous noted a soft tissue attenuation lesion in the posterolateral aspect of the right hemipelvis, stable  ? ?06/03/2021, cycle 8 chemotherapy was Doxil only without bevacizumab due to proteinuria. ? ?#hypothyroidism, Synthroid was increased by PCP to 100 MCG daily.   ?I would defer further management to primary care provider ? ?INTERVAL HISTORY ?71 y.o. female with  above oncology history reviewed by me today presents for evaluation for chemotherapy for treatment of ovarian cancer  ?Patient was accompanied by her husband.   ?She takes norvasc $RemoveBefor'5mg'tKExElyCrKyi$  daily.  ?She has noticed swelling of her lower extremities bilaterally. She measures blood pressure at home, SBP ranges between 130-150s.  ?. ?Review of Systems  ?Constitutional:  Positive for fatigue. Negative for appetite change, chills and fever.  ?HENT:   Negative for hearing loss and voice change.   ?Eyes:  Negative for eye problems.  ?Respiratory:  Negative for chest tightness and cough.   ?Cardiovascular:  Positive for leg swelling. Negative for chest pain.  ?Gastrointestinal:  Negative for abdominal distention, abdominal pain and blood in stool.  ?Endocrine: Negative for hot flashes.  ?Genitourinary:  Negative for difficulty urinating and frequency.   ?Musculoskeletal:  Negative for arthralgias.  ?Skin:  Negative for itching and rash.  ?Neurological:  Negative for extremity weakness.  ?Hematological:  Negative for adenopathy.  ?Psychiatric/Behavioral:  Negative for confusion.   ? ? ?. ?MEDICAL HISTORY: ?Past Medical History:  ?Diagnosis Date  ? Dysrhythmia   ? Genetic testing 03/28/2017  ? Multi-Cancer panel (83 genes) @ [facility] - No pathogenic mutations detected  ? High grade ovarian cancer ([place]) 11/20/2016  ? Pelvic mass in female   ? ? ?SURGICAL HISTORY: ?Past Surgical History:  ?Procedure Laterality Date  ? APPENDECTOMY    ? LAPAROSCOPY N/A 03/14/2017  ? Procedure: LAPAROSCOPY OPERATIVE;  Surgeon: Mellody Drown, MD;  Location: [facility];  Service: Gynecology;  Laterality: N/A;  ? LAPAROTOMY N/A 03/14/2017  ? Procedure: LAPAROTOMY;  Surgeon: Mellody Drown, MD;  Location: [facility];  Service: Gynecology;  Laterality: N/A;  ? LYMPH NODE DISSECTION N/A 03/14/2017  ? Procedure: LYMPH NODE DISSECTION;  Surgeon: Mellody Drown, MD;  Location: [facility];  Service: Gynecology;  Laterality: N/A;  ? OMENTECTOMY N/A  03/14/2017  ? Procedure: OMENTECTOMY;  Surgeon: Mellody Drown, MD;  Location: [facility];  Service: Gynecology;  Laterality: N/A;  ? PORTA CATH INSERTION N/A 11/27/2016  ? Procedure: Porta Cath Insertion;  Surgeon: Algernon Huxley, MD;  Location: [place] [facility];  Service: Cardiovascular;  Laterality: N/A;  ? ? ?SOCIAL HISTORY: ?Social History  ? ?Tobacco Use  ? Smoking status: Never  ? Smokeless tobacco: Never  ?Vaping Use  ? Vaping Use: Never used  ?Substance Use Topics  ? Alcohol use: Not Currently  ? Drug use: No  ? ? ? ?FAMILY HISTORY ?Family History  ?Problem Relation Age of Onset  ? Throat cancer Cousin   ? Throat cancer Cousin   ? Leukemia Cousin   ? ? ?ALLERGIES:  is allergic to omeprazole. ? ?MEDICATIONS:  ?Current Outpatient Medications  ?Medication Sig Dispense Refill  ? amLODipine (NORVASC) 5 MG tablet Take 1 tablet (5 mg total) by mouth daily. 30 tablet 1  ? levothyroxine (SYNTHROID) 100 MCG tablet Take 1 tablet (100 mcg total) by mouth daily. 90 tablet 3  ? lidocaine-prilocaine (EMLA) cream Apply 1 application topically as needed. Apply small amount to port site at least 1 hour prior to it being accessed, cover with plastic wrap 30 g 1  ? losartan (COZAAR) 25 MG tablet Take 1 tablet (25 mg total) by mouth daily. 30 tablet 0  ? ondansetron (ZOFRAN) 8 MG tablet Take 1 tablet (8 mg total) by mouth every 8 (eight) hours as needed for nausea or vomiting. 60 tablet 1  ? ?No current facility-administered medications for this visit.  ? ?Facility-Administered Medications Ordered in  Other Visits  ?Medication Dose Route Frequency Provider Last Rate Last Admin  ? heparin lock flush 100 unit/mL  500 Units Intracatheter Once PRN Earlie Server, MD      ? pegfilgrastim (NEULASTA ONPRO KIT) injection 6 mg  6 mg Subcutaneous Once Earlie Server, MD      ? ? ?PHYSICAL EXAMINATION: ? ?ECOG PERFORMANCE STATUS: 0 - Asymptomatic ?Vitals:  ? 07/29/21 0838  ?BP: (!) 167/86  ?Pulse: 77  ?Resp: 18  ?Temp: (!) 96.9 ?F (36.1 ?C)   ? ? ?Filed Weights  ? 07/29/21 0838  ?Weight: 114 lb 8 oz (51.9 kg)  ? ? ? ?Physical Exam ?Constitutional:   ?   General: She is not in acute distress. ?   Appearance: She is not diaphoretic.  ?   Comments: Thin built,

## 2021-07-29 NOTE — Progress Notes (Signed)
Pt here to establish care. Pt complains of increased swelling to feet and ankles.  ?

## 2021-07-29 NOTE — Progress Notes (Signed)
Patient tolerated Doxil infusion well, Onpro applied. Patient instructions manual given, no questions/concerns voiced. Patient stable at discharge. AVS given.   ?

## 2021-07-29 NOTE — Patient Instructions (Signed)
[facility] [place]  Discharge Instructions: ?Thank you for choosing [facility] to provide your oncology and hematology care.  ?If you have a lab appointment with the [facility], please go directly to the [facility] and check in at the registration area. ? ?Wear comfortable clothing and clothing appropriate for easy access to any Portacath or PICC line.  ? ?We strive to give you quality time with your provider. You may need to reschedule your appointment if you arrive late (15 or more minutes).  Arriving late affects you and other patients whose appointments are after yours.  Also, if you miss three or more appointments without notifying the office, you may be dismissed from the clinic at the provider?s discretion.    ?  ?For prescription refill requests, have your pharmacy contact our office and allow 72 hours for refills to be completed.   ? ?Today you received the following chemotherapy and/or immunotherapy agents: DOXOrubicin HCL & onpro ?  ?To help prevent nausea and vomiting after your treatment, we encourage you to take your nausea medication as directed. ? ?BELOW ARE SYMPTOMS THAT SHOULD BE REPORTED IMMEDIATELY: ?*FEVER GREATER THAN 100.4 F (38 ?C) OR HIGHER ?*CHILLS OR SWEATING ?*NAUSEA AND VOMITING THAT IS NOT CONTROLLED WITH YOUR NAUSEA MEDICATION ?*UNUSUAL SHORTNESS OF BREATH ?*UNUSUAL BRUISING OR BLEEDING ?*URINARY PROBLEMS (pain or burning when urinating, or frequent urination) ?*BOWEL PROBLEMS (unusual diarrhea, constipation, pain near the anus) ?TENDERNESS IN MOUTH AND THROAT WITH OR WITHOUT PRESENCE OF ULCERS (sore throat, sores in mouth, or a toothache) ?UNUSUAL RASH, SWELLING OR PAIN  ?UNUSUAL VAGINAL DISCHARGE OR ITCHING  ? ?Items with * indicate a potential emergency and should be followed up as soon as possible or go to the Emergency Department if any problems should occur. ? ?Please show the CHEMOTHERAPY ALERT CARD or IMMUNOTHERAPY ALERT CARD at  check-in to the Emergency Department and triage nurse. ? ?Should you have questions after your visit or need to cancel or reschedule your appointment, please contact [facility] [place] [facility] [place]  [phone] and follow the prompts.  Office hours are 8:00 a.m. to 4:30 p.m. Monday - Friday. Please note that voicemails left after 4:00 p.m. may not be returned until the following business day.  We are closed weekends and major holidays. You have access to a nurse at all times for urgent questions. Please call the main number to the clinic [phone] and follow the prompts. ? ?For any non-urgent questions, you may also contact your provider using MyChart. We now offer e-Visits for anyone 2 and older to request care online for non-urgent symptoms. For details visit [email or web address]. ?  ?Also download the MyChart app! Go to the app store, search "MyChart", open the app, select [facility], and log in with your MyChart username and password. ? ?Due to Covid, a mask is required upon entering the hospital/clinic. If you do not have a mask, one will be given to you upon arrival. For doctor visits, patients may have 1 support person aged 49 or older with them. For treatment visits, patients cannot have anyone with them due to current Covid guidelines and our immunocompromised population.  ?

## 2021-07-30 LAB — CA 125: Cancer Antigen (CA) 125: 13.1 U/mL (ref 0.0–38.1)

## 2021-08-19 ENCOUNTER — Ambulatory Visit: Payer: Medicare Other | Admitting: Oncology

## 2021-08-19 ENCOUNTER — Ambulatory Visit: Payer: Medicare Other

## 2021-08-19 ENCOUNTER — Other Ambulatory Visit: Payer: Medicare Other

## 2021-08-26 ENCOUNTER — Inpatient Hospital Stay (HOSPITAL_BASED_OUTPATIENT_CLINIC_OR_DEPARTMENT_OTHER): Payer: Medicare Other | Admitting: Oncology

## 2021-08-26 ENCOUNTER — Inpatient Hospital Stay: Payer: Medicare Other | Attending: Oncology

## 2021-08-26 ENCOUNTER — Encounter: Payer: Self-pay | Admitting: Oncology

## 2021-08-26 ENCOUNTER — Inpatient Hospital Stay: Payer: Medicare Other

## 2021-08-26 VITALS — BP 164/88 | HR 72 | Temp 97.1°F | Resp 18 | Wt 113.8 lb

## 2021-08-26 DIAGNOSIS — I10 Essential (primary) hypertension: Secondary | ICD-10-CM | POA: Insufficient documentation

## 2021-08-26 DIAGNOSIS — C569 Malignant neoplasm of unspecified ovary: Secondary | ICD-10-CM

## 2021-08-26 DIAGNOSIS — Z9221 Personal history of antineoplastic chemotherapy: Secondary | ICD-10-CM | POA: Insufficient documentation

## 2021-08-26 DIAGNOSIS — Z79899 Other long term (current) drug therapy: Secondary | ICD-10-CM | POA: Diagnosis not present

## 2021-08-26 DIAGNOSIS — R809 Proteinuria, unspecified: Secondary | ICD-10-CM | POA: Diagnosis not present

## 2021-08-26 DIAGNOSIS — E039 Hypothyroidism, unspecified: Secondary | ICD-10-CM | POA: Insufficient documentation

## 2021-08-26 DIAGNOSIS — R2243 Localized swelling, mass and lump, lower limb, bilateral: Secondary | ICD-10-CM

## 2021-08-26 DIAGNOSIS — D6481 Anemia due to antineoplastic chemotherapy: Secondary | ICD-10-CM | POA: Diagnosis not present

## 2021-08-26 DIAGNOSIS — D7589 Other specified diseases of blood and blood-forming organs: Secondary | ICD-10-CM | POA: Insufficient documentation

## 2021-08-26 DIAGNOSIS — D701 Agranulocytosis secondary to cancer chemotherapy: Secondary | ICD-10-CM | POA: Diagnosis not present

## 2021-08-26 DIAGNOSIS — E871 Hypo-osmolality and hyponatremia: Secondary | ICD-10-CM | POA: Diagnosis not present

## 2021-08-26 DIAGNOSIS — Z5111 Encounter for antineoplastic chemotherapy: Secondary | ICD-10-CM

## 2021-08-26 DIAGNOSIS — Z5189 Encounter for other specified aftercare: Secondary | ICD-10-CM | POA: Insufficient documentation

## 2021-08-26 DIAGNOSIS — T451X5A Adverse effect of antineoplastic and immunosuppressive drugs, initial encounter: Secondary | ICD-10-CM | POA: Diagnosis not present

## 2021-08-26 LAB — CBC WITH DIFFERENTIAL/PLATELET
Abs Immature Granulocytes: 0.01 10*3/uL (ref 0.00–0.07)
Basophils Absolute: 0.1 10*3/uL (ref 0.0–0.1)
Basophils Relative: 1 %
Eosinophils Absolute: 0.1 10*3/uL (ref 0.0–0.5)
Eosinophils Relative: 3 %
HCT: 29 % — ABNORMAL LOW (ref 36.0–46.0)
Hemoglobin: 9.4 g/dL — ABNORMAL LOW (ref 12.0–15.0)
Immature Granulocytes: 0 %
Lymphocytes Relative: 19 %
Lymphs Abs: 0.8 10*3/uL (ref 0.7–4.0)
MCH: 34.1 pg — ABNORMAL HIGH (ref 26.0–34.0)
MCHC: 32.4 g/dL (ref 30.0–36.0)
MCV: 105.1 fL — ABNORMAL HIGH (ref 80.0–100.0)
Monocytes Absolute: 0.5 10*3/uL (ref 0.1–1.0)
Monocytes Relative: 12 %
Neutro Abs: 2.7 10*3/uL (ref 1.7–7.7)
Neutrophils Relative %: 65 %
Platelets: 133 10*3/uL — ABNORMAL LOW (ref 150–400)
RBC: 2.76 MIL/uL — ABNORMAL LOW (ref 3.87–5.11)
RDW: 15.7 % — ABNORMAL HIGH (ref 11.5–15.5)
WBC: 4.1 10*3/uL (ref 4.0–10.5)
nRBC: 0 % (ref 0.0–0.2)

## 2021-08-26 LAB — COMPREHENSIVE METABOLIC PANEL
ALT: 9 U/L (ref 0–44)
AST: 19 U/L (ref 15–41)
Albumin: 3.7 g/dL (ref 3.5–5.0)
Alkaline Phosphatase: 51 U/L (ref 38–126)
Anion gap: 6 (ref 5–15)
BUN: 13 mg/dL (ref 8–23)
CO2: 23 mmol/L (ref 22–32)
Calcium: 9.3 mg/dL (ref 8.9–10.3)
Chloride: 104 mmol/L (ref 98–111)
Creatinine, Ser: 0.64 mg/dL (ref 0.44–1.00)
GFR, Estimated: >60 mL/min (ref >60)
Glucose, Bld: 102 mg/dL — ABNORMAL HIGH (ref 70–99)
Potassium: 3.8 mmol/L (ref 3.5–5.1)
Sodium: 133 mmol/L — ABNORMAL LOW (ref 135–145)
Total Bilirubin: 0.7 mg/dL (ref 0.3–1.2)
Total Protein: 6.4 g/dL — ABNORMAL LOW (ref 6.5–8.1)

## 2021-08-26 LAB — PROTEIN, URINE, RANDOM: Total Protein, Urine: 164 mg/dL

## 2021-08-26 MED ORDER — HEPARIN SOD (PORK) LOCK FLUSH 100 UNIT/ML IV SOLN
500.0000 [IU] | Freq: Once | INTRAVENOUS | Status: AC | PRN
  Administered 2021-08-26: 500 [IU]
  Filled 2021-08-26: qty 5

## 2021-08-26 MED ORDER — DEXTROSE 5 % IV SOLN
Freq: Once | INTRAVENOUS | Status: AC
  Filled 2021-08-26: qty 250

## 2021-08-26 MED ORDER — PEGFILGRASTIM 6 MG/0.6ML ~~LOC~~ PSKT
6.0000 mg | PREFILLED_SYRINGE | Freq: Once | SUBCUTANEOUS | Status: AC
  Administered 2021-08-26: 6 mg via SUBCUTANEOUS
  Filled 2021-08-26: qty 0.6

## 2021-08-26 MED ORDER — DOXORUBICIN HCL LIPOSOMAL CHEMO INJECTION 2 MG/ML
40.0000 mg/m2 | Freq: Once | INTRAVENOUS | Status: AC
  Administered 2021-08-26: 64 mg via INTRAVENOUS
  Filled 2021-08-26: qty 25

## 2021-08-26 MED ORDER — LOSARTAN POTASSIUM 25 MG PO TABS: 25.0000 mg | ORAL_TABLET | Freq: Every day | ORAL | 0 refills | Status: DC

## 2021-08-26 NOTE — Progress Notes (Signed)
Patient here for follow up. Pt reports feet and ankle swelling, worse at night.

## 2021-08-26 NOTE — Progress Notes (Signed)
Patient tolerated DOXOrubicin infusion well, no questions/concerns voiced. Onpro also administered. Patient stable at discharge. AVS given.

## 2021-08-26 NOTE — Progress Notes (Addendum)
Hematology/Oncology Progress  note [phone]      Patient Care Team: Cletis Athens, MD as PCP - General (Internal Medicine) Clent Jacks, [name], [name], [name], MD (Inactive) as Referring Physician (Obstetrics and Gynecology)  CHIEF COMPLAINTS/PURPOSE OF Visit Follow up for chemotherapy for ovarian cancer.  HISTORY OF PRESENTING ILLNESS: Katie Hill 71 y.o. female presents for follow up of management of stage IIIC ovarian cancer. She underwent  Neoadjuvant chemotherapy of carbo and taxol x 4  # Patient had debulking surgery on 03/14/2017. She had a laparoscopy with conversion to laparotomy, total hysterectomy, with bilateral salpingo oophorectomy, right aortic lymph node dissection, omentectomy. Pathology showed small foci of residual disease in ovary.   Genetic testing negative for 83 genes on [facility]'s Multi-Cancer panel (ALK, APC, ATM, AXIN2, BAP1, BARD1, BLM, BMPR1A, BRCA1, BRCA2, BRIP1, CASR, CDC73, CDH1, CDK4, CDKN1B, CDKN1C, CDKN2A, CEBPA, CHEK2, CTNNA1, DICER1, DIS3L2, EGFR, EPCAM, FH, FLCN, GATA2, GPC3, GREM1, HOXB13, HRAS, KIT, MAX, MEN1, MET, MITF, MLH1, MSH2, MSH3, MSH6, MUTYH, NBN, NF1, NF2, NTHL1, PALB2, PDGFRA, PHOX2B, PMS2, POLD1, POLE, POT1, PRKAR1A, PTCH1, PTEN, RAD50, RAD51C, RAD51D, RB1, RECQL4, RET, RUNX1, SDHA, SDHAF2, SDHB, SDHC, SDHD, SMAD4, SMARCA4, SMARCB1, SMARCE1, STK11, SUFU, TERC, TERT, TMEM127, TP53, TSC1, TSC2, VHL, WRN, WT1).   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  Myraid testing negative for somatic BRACA1/2, positive for HRD.   # HRD positive, was referred to [facility] for clinical trials of Olarparib maintenance. She opted out.  #  Treatment:  Stage IIIC Ovarian cancer:  #s/p Carboplatin and taxol x 4 neoadjuvant chemotherapy followed by debulking  surgery 03/14/2017 # 03/28/2017 S/p Adjuvant carbo and taxol x3   Local recurrence, CA125 one 46.3 03/15/2018-05/17/2018 Carboplatin and Taxol x 4. CA125 decrease from 49.7 to 6 after 4 cycles of treatment.  Started on Olarparib 363m BID on 05/17/2018.  # was seen by Dr. BFransisca Connorsfor further evaluation due to rising Ca1 25 and MRI abdomenPelvis on 11/05/2018 showed Interval progression of, now measuring 2.7 x 2.3 cm and concerning for progression of metastatic disease.retrocaval lymph node in the abdomen Olaparib was held for short period of time. Her CA125 trended down again.  Dr. BFransisca Connorsrecommending resume olaparib and continue monitor. If she has more significant progression of disease she may be a candidate for clinical trial or will be treated with other chemotherapy drugs including Doxil, gemcitabine and Avastin.  #10/06/2020, CT chest abdomen pelvis with contrast showed new lymph nodes in the prevascular space of the upper anterior mediastinum most consistent with metastasis.  Single new right lower lobe pulmonary nodule is concerning for early pulmonary metastasis.  Interval enlargement of the left periaortic retroperitoneal lymph node.  Stable adjacent aorto caval adenopathy.  10/29/2020 MUGA LVEF normal 10/29/2020 Stopped Olarparib  11/03/2020 cycle 1 Doxil + bevacizumab 01/26/2021, CT chest abdomen pelvis-partial response-decrease of lung nodule.  Unchanged lymphadenopathy. 01/14/21 Echocardiogram -LVEF 55-60% 05/16/2021, CT chest abdomen pelvis with contrast showed a slightly decreased size of lymph nodes in the mediastinum and upper left retroperitoneum.  Previous noted a soft tissue attenuation lesion in the posterolateral aspect of the right hemipelvis, stable   06/03/2021, cycle 8 chemotherapy was Doxil only without bevacizumab due to proteinuria.  #hypothyroidism, Synthroid was increased by PCP to 100 MCG daily.   I would defer further management to primary care provider  INTERVAL  HISTORY 71y.o. female  with above oncology history reviewed by me today presents for evaluation for chemotherapy for treatment of ovarian cancer  Patient was accompanied by her husband.   She takes norvasc 18m daily.  She has noticed swelling of her lower extremities bilaterally. She measures blood pressure at home, SBP is around 150s.  She has started on losartan 25 mg daily a week ago.  Tolerates well.  No nausea vomiting diarrhea.  She moaned a lot yesterday and had skin irritation around the ankle area where her boots rubbed her. . Review of Systems  Constitutional:  Positive for fatigue. Negative for appetite change, chills and fever.  HENT:   Negative for hearing loss and voice change.   Eyes:  Negative for eye problems.  Respiratory:  Negative for chest tightness and cough.   Cardiovascular:  Positive for leg swelling. Negative for chest pain.  Gastrointestinal:  Negative for abdominal distention, abdominal pain and blood in stool.  Endocrine: Negative for hot flashes.  Genitourinary:  Negative for difficulty urinating and frequency.   Musculoskeletal:  Negative for arthralgias.  Skin:  Negative for itching and rash.  Neurological:  Negative for extremity weakness.  Hematological:  Negative for adenopathy.  Psychiatric/Behavioral:  Negative for confusion.     .Marland KitchenMEDICAL HISTORY: Past Medical History:  Diagnosis Date   Dysrhythmia    Genetic testing 03/28/2017   Multi-Cancer panel (83 genes) @ [facility] - No pathogenic mutations detected   [place]   Pelvic mass in female     SURGICAL HISTORY: Past Surgical History:  Procedure Laterality Date   APPENDECTOMY     LAPAROSCOPY N/A 03/14/2017   Procedure: LAPAROSCOPY OPERATIVE;  Surgeon: BMellody Drown MD;  Location: [facility];  Service: Gynecology;  Laterality: N/A;   LAPAROTOMY N/A 03/14/2017   Procedure: LAPAROTOMY;  Surgeon: BMellody Drown MD;  Location: [facility];  Service: Gynecology;   Laterality: N/A;   LYMPH NODE DISSECTION N/A 03/14/2017   Procedure: LYMPH NODE DISSECTION;  Surgeon: BMellody Drown MD;  Location: [facility];  Service: Gynecology;  Laterality: N/A;   OMENTECTOMY N/A 03/14/2017   Procedure: OMENTECTOMY;  Surgeon: BMellody Drown MD;  Location: [facility];  Service: Gynecology;  Laterality: N/A;   PORTA CATH INSERTION N/A 11/27/2016   Procedure: PGlori LuisCath Insertion;  Surgeon: DAlgernon Huxley MD;  [facility];  Service: Cardiovascular;  Laterality: N/A;    SOCIAL HISTORY: Social History   Tobacco Use   Smoking status: Never   Smokeless tobacco: Never  Vaping Use   Vaping Use: Never used  Substance Use Topics   Alcohol use: Not Currently   Drug use: No     FAMILY HISTORY Family History  Problem Relation Age of Onset   Throat cancer Cousin    Throat cancer Cousin    Leukemia Cousin     ALLERGIES:  is allergic to omeprazole.  MEDICATIONS:  Current Outpatient Medications  Medication Sig Dispense Refill   amLODipine (NORVASC) 5 MG tablet Take 1 tablet (5 mg total) by mouth daily. 30 tablet 1   levothyroxine (SYNTHROID) 100 MCG tablet Take 1 tablet (100 mcg total) by mouth daily. 90 tablet 3   lidocaine-prilocaine (EMLA) cream Apply 1 application topically as needed. Apply small amount to port site at least 1 hour prior to it being accessed, cover with plastic wrap 30 g 1   ondansetron (ZOFRAN) 8 MG tablet Take 1 tablet (8 mg total) by mouth every 8 (eight) hours as needed for nausea  or vomiting. 60 tablet 1   losartan (COZAAR) 25 MG tablet Take 1 tablet (25 mg total) by mouth daily. 90 tablet 0   No current facility-administered medications for this visit.   Facility-Administered Medications Ordered in Other Visits  Medication Dose Route Frequency Provider Last Rate Last Admin   DOXOrubicin HCL LIPOSOMAL (DOXIL) 64 mg in dextrose 5 % 250 mL chemo infusion  40 mg/m2 (Order-Specific) Intravenous Once Earlie Server, MD 282 mL/hr at  08/26/21 0926 64 mg at 08/26/21 0926   heparin lock flush 100 unit/mL  500 Units Intracatheter Once PRN Earlie Server, MD        PHYSICAL EXAMINATION:  ECOG PERFORMANCE STATUS: 0 - Asymptomatic Vitals:   08/26/21 0827  BP: (!) 164/88  Pulse: 72  Resp: 18  Temp: (!) 97.1 F (36.2 C)    Filed Weights   08/26/21 0827  Weight: 113 lb 12.8 oz (51.6 kg)     Physical Exam Constitutional:      General: She is not in acute distress.    Appearance: She is not diaphoretic.     Comments: Thin built, she walks independantly  HENT:     Head: Normocephalic and atraumatic.     Nose: Nose normal.     Mouth/Throat:     Pharynx: No oropharyngeal exudate.  Eyes:     General: No scleral icterus.    Pupils: Pupils are equal, round, and reactive to light.  Neck:     Vascular: No JVD.  Cardiovascular:     Rate and Rhythm: Normal rate and regular rhythm.     Heart sounds: No murmur heard. Pulmonary:     Effort: Pulmonary effort is normal. No respiratory distress.     Breath sounds: Normal breath sounds. No rales.  Chest:     Chest wall: No tenderness.  Abdominal:     General: Bowel sounds are normal. There is no distension.     Palpations: Abdomen is soft.     Tenderness: There is no abdominal tenderness.  Musculoskeletal:        General: Swelling present. Normal range of motion.     Cervical back: Normal range of motion and neck supple.  Lymphadenopathy:     Cervical: No cervical adenopathy.  Skin:    General: Skin is warm and dry.     Findings: Erythema present. No rash.     Comments: Right anterior medi port +   Neurological:     Mental Status: She is alert and oriented to person, place, and time.     Cranial Nerves: No cranial nerve deficit.     Motor: No abnormal muscle tone.     Coordination: Coordination normal.  Psychiatric:        Mood and Affect: Affect normal.        Judgment: Judgment normal.       LABORATORY DATA: I have personally reviewed the data as  listed: CBC    Component Value Date/Time   WBC 4.1 08/26/2021 0809   RBC 2.76 (L) 08/26/2021 0809   HGB 9.4 (L) 08/26/2021 0809   HCT 29.0 (L) 08/26/2021 0809   PLT 133 (L) 08/26/2021 0809   MCV 105.1 (H) 08/26/2021 0809   MCH 34.1 (H) 08/26/2021 0809   MCHC 32.4 08/26/2021 0809   RDW 15.7 (H) 08/26/2021 0809   LYMPHSABS 0.8 08/26/2021 0809   MONOABS 0.5 08/26/2021 0809   EOSABS 0.1 08/26/2021 0809   BASOSABS 0.1 08/26/2021 0809      Latest Ref  Rng & Units 08/26/2021    8:09 AM 07/29/2021    8:18 AM 07/15/2021    8:53 AM  CMP  Glucose 70 - 99 mg/dL 102   90   93    BUN 8 - 23 mg/dL _0 Creatinine 0.44 - 1.00 mg/dL 0.64   0.64   0.62    Sodium 135 - 145 mmol/L 133   131   130    Potassium 3.5 - 5.1 mmol/L 3.8   3.8   4.0    Chloride 98 - 111 mmol/L 104   103   102    CO2 22 - 32 mmol/L _1 Calcium 8.9 - 10.3 mg/dL 9.3   9.4   9.4    Total Protein 6.5 - 8.1 g/dL 6.4   6.9   6.8    Total Bilirubin 0.3 - 1.2 mg/dL 0.7   0.7   0.6    Alkaline Phos 38 - 126 U/L 51   58   70    AST 15 - 41 U/L _2 ALT 0 - 44 U/L _3 Pathology 11/16/2016 Surgical Pathology  CASE: [id number]  PATIENT: Brooklyn Saal  Surgical Pathology Report   SPECIMEN SUBMITTED:  A. Retroperitoneal adenopathy, left  DIAGNOSIS:  A. LYMPH NODE, LEFT RETROPERITONEAL; CT-GUIDED CORE BIOPSY:  - METASTATIC HIGH-GRADE SEROUS CARCINOMA.  Pathology 03/14/2017   DIAGNOSIS:  A. OMENTUM; OMENTECTOMY:  - NO TUMOR SEEN.  - ONE NEGATIVE LYMPH NODE (0/1).   B.  RIGHT FALLOPIAN TUBE AND OVARY; SALPINGO-OOPHORECTOMY:  - SMALL FOCI OF HIGH GRADE SEROUS CARCINOMA INVOLVING THE OVARY.  - MARKED THERAPY RELATED CHANGE.  - NO TUMOR SEEN IN THE FALLOPIAN TUBE.   C.  UTERUS, CERVIX, LEFT FALLOPIAN TUBE AND OVARY; HYSTERECTOMY AND LEFT  SALPINGO-OOPHORECTOMY:  - NABOTHIAN CYSTS.  - CYSTIC ATROPHY OF THE ENDOMETRIUM.  - SEROSAL ADHESIONS.  - UNREMARKABLE FALLOPIAN TUBE.   - OVARY SHOWING TREATMENT RELATED CHANGE.   D.  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    RADIOGRAPHIC STUDIES: I have personally reviewed the radiological images as listed and agreed with the findings in the report. No results found.   ASSESSMENT/PLAN  Cancer Staging  Malignant neoplasm of ovary [facility]) Staging form: Ovary, Fallopian Tube, and Primary Peritoneal Carcinoma, AJCC 8th Edition - Clinical stage from 11/22/2016: Stage IIIC (cT3c, cN1b, cM0) - Signed by Earlie Server, MD on 11/22/2016 Stage used in treatment planning: Yes National guidelines used in treatment planning: Yes Type of national guideline used in treatment planning: NCCN  1. High grade ovarian cancer ([place])   2. Encounter for antineoplastic chemotherapy   3. Anemia associated with chemotherapy   4. Chemotherapy induced neutropenia (H[facility])   5. Malignant neoplasm of ovary, unspecified laterality ([place])   6. Localized swelling, mass, or lump of lower extremity, bilateral   : # Ovarian Cancer, local recurrence.  Currently on olaparib 300 mg twice daily. She tolerates well.  CA 125 18.7-->61.2-->111-->90.5-->69--> 54.8--> 29.1-->47-->71.2-->67.7->73.2-->82.8-->70.4-->66.4--> 58.4--> 64.9--> 56.9--> 42.2-->47.7-->54.4-->68.2-> 97-->168-->70.3--> 57.4--> 49.4->33.7-->32-->23--> 15.1 Labs reviewed and discussed with patient. Proceed with Doxil today.  Continue to hold bevacizumab.  #Proteinuria, today random urine is above 100.  Hold off bevacizumab.  #Hypertension, likely secondary to bevacizumab.   Continue losartan 25 mg daily.  Recommend patient to hold off Norvasc for a few days to see if this medication attributes to the lower extremity swelling.  Recommend patient to elevate legs and apply compression stocking.  Chronic anemia, chemotherapy-induced.   Slightly decreased. Chronic macrocytosis with adequate B12 and folate  Hyponatremia,  likely secondary to hypothyroidism.  Stable. Hypothyroidism, on Synthroid.  Follow-up with PCP.  Port-A-Cath in place-active IV chemotherapy treatments.   RTF . 4 weeks for lab Doxil     Earlie Server, MD, PhD 08/26/21

## 2021-08-27 LAB — CA 125: Cancer Antigen (CA) 125: 13.3 U/mL (ref 0.0–38.1)

## 2021-09-19 ENCOUNTER — Ambulatory Visit
Admission: RE | Admit: 2021-09-19 | Discharge: 2021-09-19 | Disposition: A | Discharge status: Home / Self Care | Payer: Medicare Other | Source: Ambulatory Visit | Attending: Oncology | Admitting: Oncology

## 2021-09-19 DIAGNOSIS — Z9049 Acquired absence of other specified parts of digestive tract: Secondary | ICD-10-CM | POA: Insufficient documentation

## 2021-09-19 DIAGNOSIS — Z90710 Acquired absence of both cervix and uterus: Secondary | ICD-10-CM | POA: Diagnosis not present

## 2021-09-19 DIAGNOSIS — C569 Malignant neoplasm of unspecified ovary: Secondary | ICD-10-CM | POA: Insufficient documentation

## 2021-09-19 DIAGNOSIS — K449 Diaphragmatic hernia without obstruction or gangrene: Secondary | ICD-10-CM | POA: Diagnosis not present

## 2021-09-19 DIAGNOSIS — N2889 Other specified disorders of kidney and ureter: Secondary | ICD-10-CM | POA: Insufficient documentation

## 2021-09-19 DIAGNOSIS — R911 Solitary pulmonary nodule: Secondary | ICD-10-CM | POA: Diagnosis not present

## 2021-09-19 DIAGNOSIS — Z8543 Personal history of malignant neoplasm of ovary: Secondary | ICD-10-CM | POA: Insufficient documentation

## 2021-09-19 DIAGNOSIS — R2243 Localized swelling, mass and lump, lower limb, bilateral: Secondary | ICD-10-CM | POA: Diagnosis present

## 2021-09-19 DIAGNOSIS — J439 Emphysema, unspecified: Secondary | ICD-10-CM | POA: Diagnosis not present

## 2021-09-19 DIAGNOSIS — Z9221 Personal history of antineoplastic chemotherapy: Secondary | ICD-10-CM | POA: Diagnosis not present

## 2021-09-19 DIAGNOSIS — R590 Localized enlarged lymph nodes: Secondary | ICD-10-CM | POA: Insufficient documentation

## 2021-09-19 DIAGNOSIS — I700 Atherosclerosis of aorta: Secondary | ICD-10-CM | POA: Insufficient documentation

## 2021-09-19 MED ORDER — IOHEXOL 300 MG/ML  SOLN
100.0000 mL | Freq: Once | INTRAMUSCULAR | Status: AC | PRN
  Administered 2021-09-19: 100 mL via INTRAVENOUS

## 2021-09-23 ENCOUNTER — Inpatient Hospital Stay: Payer: Medicare Other | Attending: Oncology

## 2021-09-23 ENCOUNTER — Inpatient Hospital Stay (HOSPITAL_BASED_OUTPATIENT_CLINIC_OR_DEPARTMENT_OTHER): Payer: Medicare Other | Admitting: Oncology

## 2021-09-23 ENCOUNTER — Inpatient Hospital Stay: Payer: Medicare Other

## 2021-09-23 ENCOUNTER — Encounter: Payer: Self-pay | Admitting: Oncology

## 2021-09-23 VITALS — BP 166/96 | HR 79 | Temp 96.5°F | Ht 67.0 in | Wt 113.0 lb

## 2021-09-23 DIAGNOSIS — R809 Proteinuria, unspecified: Secondary | ICD-10-CM | POA: Insufficient documentation

## 2021-09-23 DIAGNOSIS — D649 Anemia, unspecified: Secondary | ICD-10-CM

## 2021-09-23 DIAGNOSIS — R808 Other proteinuria: Secondary | ICD-10-CM | POA: Diagnosis not present

## 2021-09-23 DIAGNOSIS — I10 Essential (primary) hypertension: Secondary | ICD-10-CM | POA: Diagnosis not present

## 2021-09-23 DIAGNOSIS — Z01818 Encounter for other preprocedural examination: Secondary | ICD-10-CM | POA: Diagnosis not present

## 2021-09-23 DIAGNOSIS — Z79899 Other long term (current) drug therapy: Secondary | ICD-10-CM | POA: Diagnosis not present

## 2021-09-23 DIAGNOSIS — Z5111 Encounter for antineoplastic chemotherapy: Secondary | ICD-10-CM

## 2021-09-23 DIAGNOSIS — D6481 Anemia due to antineoplastic chemotherapy: Secondary | ICD-10-CM | POA: Diagnosis not present

## 2021-09-23 DIAGNOSIS — C569 Malignant neoplasm of unspecified ovary: Secondary | ICD-10-CM | POA: Insufficient documentation

## 2021-09-23 DIAGNOSIS — Z0189 Encounter for other specified special examinations: Secondary | ICD-10-CM

## 2021-09-23 DIAGNOSIS — Z5181 Encounter for therapeutic drug level monitoring: Secondary | ICD-10-CM | POA: Diagnosis not present

## 2021-09-23 DIAGNOSIS — C561 Malignant neoplasm of right ovary: Secondary | ICD-10-CM

## 2021-09-23 LAB — COMPREHENSIVE METABOLIC PANEL
ALT: 9 U/L (ref 0–44)
AST: 18 U/L (ref 15–41)
Albumin: 3.7 g/dL (ref 3.5–5.0)
Alkaline Phosphatase: 60 U/L (ref 38–126)
Anion gap: 6 (ref 5–15)
BUN: 16 mg/dL (ref 8–23)
CO2: 22 mmol/L (ref 22–32)
Calcium: 9.1 mg/dL (ref 8.9–10.3)
Chloride: 104 mmol/L (ref 98–111)
Creatinine, Ser: 0.8 mg/dL (ref 0.44–1.00)
GFR, Estimated: >60 mL/min (ref >60)
Glucose, Bld: 88 mg/dL (ref 70–99)
Potassium: 4 mmol/L (ref 3.5–5.1)
Sodium: 132 mmol/L — ABNORMAL LOW (ref 135–145)
Total Bilirubin: 0.7 mg/dL (ref 0.3–1.2)
Total Protein: 6.8 g/dL (ref 6.5–8.1)

## 2021-09-23 LAB — CBC WITH DIFFERENTIAL/PLATELET
Abs Immature Granulocytes: 0.02 10*3/uL (ref 0.00–0.07)
Basophils Absolute: 0 10*3/uL (ref 0.0–0.1)
Basophils Relative: 1 %
Eosinophils Absolute: 0.1 10*3/uL (ref 0.0–0.5)
Eosinophils Relative: 2 %
HCT: 26.8 % — ABNORMAL LOW (ref 36.0–46.0)
Hemoglobin: 8.9 g/dL — ABNORMAL LOW (ref 12.0–15.0)
Immature Granulocytes: 0 %
Lymphocytes Relative: 16 %
Lymphs Abs: 0.8 10*3/uL (ref 0.7–4.0)
MCH: 34.6 pg — ABNORMAL HIGH (ref 26.0–34.0)
MCHC: 33.2 g/dL (ref 30.0–36.0)
MCV: 104.3 fL — ABNORMAL HIGH (ref 80.0–100.0)
Monocytes Absolute: 0.6 10*3/uL (ref 0.1–1.0)
Monocytes Relative: 12 %
Neutro Abs: 3.4 10*3/uL (ref 1.7–7.7)
Neutrophils Relative %: 69 %
Platelets: 100 10*3/uL — ABNORMAL LOW (ref 150–400)
RBC: 2.57 MIL/uL — ABNORMAL LOW (ref 3.87–5.11)
RDW: 15.6 % — ABNORMAL HIGH (ref 11.5–15.5)
WBC: 4.9 10*3/uL (ref 4.0–10.5)
nRBC: 0 % (ref 0.0–0.2)

## 2021-09-23 LAB — PROTEIN, URINE, RANDOM: Total Protein, Urine: 190 mg/dL

## 2021-09-23 MED ORDER — HEPARIN SOD (PORK) LOCK FLUSH 100 UNIT/ML IV SOLN
500.0000 [IU] | Freq: Once | INTRAVENOUS | Status: AC | PRN
  Administered 2021-09-23: 500 [IU]
  Filled 2021-09-23: qty 5

## 2021-09-23 MED ORDER — PEGFILGRASTIM 6 MG/0.6ML ~~LOC~~ PSKT
6.0000 mg | PREFILLED_SYRINGE | Freq: Once | SUBCUTANEOUS | Status: AC
  Administered 2021-09-23: 6 mg via SUBCUTANEOUS
  Filled 2021-09-23: qty 0.6

## 2021-09-23 MED ORDER — SODIUM CHLORIDE 0.9 % IV SOLN
Freq: Once | INTRAVENOUS | Status: AC
  Filled 2021-09-23: qty 250

## 2021-09-23 MED ORDER — DOXORUBICIN HCL LIPOSOMAL CHEMO INJECTION 2 MG/ML
40.0000 mg/m2 | Freq: Once | INTRAVENOUS | Status: AC
  Administered 2021-09-23: 64 mg via INTRAVENOUS
  Filled 2021-09-23: qty 25

## 2021-09-23 MED ORDER — DEXTROSE 5 % IV SOLN
Freq: Once | INTRAVENOUS | Status: DC
  Filled 2021-09-23: qty 250

## 2021-09-23 NOTE — Assessment & Plan Note (Signed)
Continue amlodipine 5 mg daily as well as losartan 25 mg daily.  

## 2021-09-23 NOTE — Progress Notes (Signed)
Hematology/Oncology Progress note [phone] [phone]         Patient Care Team: Cletis Athens, MD as PCP - General (Internal Medicine) Clent Jacks, [name], [name], [name], MD as Referring Physician (Obstetrics and Gynecology)  ASSESSMENT & PLAN:   Malignant neoplasm of ovary [facility]) Labs reviewed and discussed with patient. CT showed stable disease Proceed with Doxil today.  Continue to hold bevacizumab. Repeat echocardiogram  Proteinuria Continue hold bevacizumab.  Monitor monthly.    Hypertension Continue amlodipine 5 mg daily as well as losartan 25 mg daily.   Anemia Secondary to chemotherapy.  Hemoglobin trended down slightly. Continue monitor  Orders Placed This Encounter  Procedures   ECHOCARDIOGRAM COMPLETE    Standing Status:   Future    Standing Expiration Date:   09/24/2022    Order Specific Question:   Where should this test be performed    Answer:   [facility]    Order Specific Question:   Perflutren DEFINITY (image enhancing agent) should be administered unless hypersensitivity or allergy exist    Answer:   Administer Perflutren    Order Specific Question:   Reason for exam-Echo    Answer:   Chemo  Z09   Follow-up in 4 weeks lab MD Doxil. All questions were answered. The patient knows to call the clinic with any problems, questions or concerns.  Earlie Server, MD, PhD [facility] Hematology Oncology 09/23/2021     CHIEF COMPLAINTS/PURPOSE OF Visit Follow up for chemotherapy for ovarian cancer.  HISTORY OF PRESENTING ILLNESS: Katie Hill 71 y.o. female presents for follow up of management of recurrent  ovarian cancer. Oncology History  Malignant neoplasm of ovary ([place])  11/20/2016 Initial Diagnosis   Malignant neoplasm of ovary   Pathology 11/16/2016 Surgical  Pathology  CASE: [id number]  PATIENT: Katie Hill  Surgical Pathology Report   SPECIMEN SUBMITTED:  A. Retroperitoneal adenopathy, left  DIAGNOSIS:  A. LYMPH NODE, LEFT RETROPERITONEAL; CT-GUIDED CORE BIOPSY:  - METASTATIC HIGH-GRADE SEROUS CARCINOMA.  Pathology 03/14/2017   DIAGNOSIS:  A. OMENTUM; OMENTECTOMY:  - NO TUMOR SEEN.  - ONE NEGATIVE LYMPH NODE (0/1).   B.  RIGHT FALLOPIAN TUBE AND OVARY; SALPINGO-OOPHORECTOMY:  - SMALL FOCI OF HIGH GRADE SEROUS CARCINOMA INVOLVING THE OVARY.  - MARKED THERAPY RELATED CHANGE.  - NO TUMOR SEEN IN THE FALLOPIAN TUBE.   C.  UTERUS, CERVIX, LEFT FALLOPIAN TUBE AND OVARY; HYSTERECTOMY AND LEFT  SALPINGO-OOPHORECTOMY:  - NABOTHIAN CYSTS.  - CYSTIC ATROPHY OF THE ENDOMETRIUM.  - SEROSAL ADHESIONS.  - UNREMARKABLE FALLOPIAN TUBE.  - OVARY SHOWING TREATMENT RELATED CHANGE.   D.  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    12/01/2016 -  Chemotherapy   Carboplatin and taxol x 4 neoadjuvant chemotherapy    03/14/2017 Surgery   Patient had debulking surgery on 03/14/2017. She had a laparoscopy with conversion to laparotomy, total hysterectomy, with bilateral salpingo oophorectomy, right aortic lymph node dissection, omentectomy. Pathology showed small foci of residual disease in ovary.   HRD positive, declined Olarparib maintenance trial    03/28/2017 -  Chemotherapy   Adjuvant carbo and taxol x3 cycles   02/28/2018 Progression   Local Recurrence. CA125 146 CT abdomen pelvis w contrast showed interval enlargement of an aortocaval lymph node which currently measures 1.4 cm in short axis (axial image  75 of series 2). This lymph node is in close proximity to the previously resected retroperitoneal lymphadenopathy and is very concerning for an additional nodal metastasis.    03/15/2018 -  Chemotherapy   03/15/2018-05/17/2018 Carboplatin and Taxol x 4.  CA125  decrease from 49.7 to 6 after 4 cycles of treatment.  05/17/2018-10/29/20   Olarparib $RemoveBe'300mg'PDrZAHlch$  BID    11/05/2018 Imaging   MRI abdomenPelvis on 11/05/2018 showed Interval progression of, now measuring 2.7 x 2.3 cm and concerning for progression of metastatic disease.retrocaval lymph node in the abdomen Olaparib was held for short period of time. Her CA125 trended down again.  Dr. Fransisca Connors recommending resume olaparib and continue monitor.   10/06/2020 Progression    CT chest abdomen pelvis with contrast showed new lymph nodes in the prevascular space of the upper anterior mediastinum most consistent with metastasis.  Single new right lower lobe pulmonary nodule is concerning for early pulmonary metastasis.  Interval enlargement of the left periaortic retroperitoneal lymph node.  Stable adjacent aorto caval adenopathy.   11/03/2020 -  Chemotherapy   11/03/20 -05/20/21 Doxil + Bevacizumab q28d  06/03/21 Doxil only due to proteinuria.  07/01/21-07/15/21  Resumed on Doxil + Bevacizumab 07/29/21 Doxil only due to proteinuria.     Genetic Testing   Genetic testing negative for 83 genes on [facility]'s Multi-Cancer panel (ALK, APC, ATM, AXIN2, BAP1, BARD1, BLM, BMPR1A, BRCA1, BRCA2, BRIP1, CASR, CDC73, CDH1, CDK4, CDKN1B, CDKN1C, CDKN2A, CEBPA, CHEK2, CTNNA1, DICER1, DIS3L2, EGFR, EPCAM, FH, FLCN, GATA2, GPC3, GREM1, HOXB13, HRAS, KIT, MAX, MEN1, MET, MITF, MLH1, MSH2, MSH3, MSH6, MUTYH, NBN, NF1, NF2, NTHL1, PALB2, PDGFRA, PHOX2B, PMS2, POLD1, POLE, POT1, PRKAR1A, PTCH1, PTEN, RAD50, RAD51C, RAD51D, RB1, RECQL4, RET, RUNX1, SDHA, SDHAF2, SDHB, SDHC, SDHD, SMAD4, SMARCA4, SMARCB1, SMARCE1, STK11, SUFU, TERC, TERT, TMEM127, TP53, TSC1, TSC2, VHL, WRN, WT1).   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  Myraid testing negative for somatic BRACA1/2, positive for HRD.    High grade ovarian cancer ([place])  12/04/2019 Initial Diagnosis   High grade ovarian cancer ([place])   11/03/2020 -   Chemotherapy   11/03/20 -05/20/21 Doxil + Bevacizumab q28d  06/03/21 Doxil only due to proteinuria.  07/01/21-07/15/21  Resumed on Doxil + Bevacizumab 07/29/21 Doxil only due to proteinuria.        Patient was accompanied by her husband.   She takes norvasc $RemoveBefor'5mg'UPrNYKdcCMyT$  daily, losartan $RemoveBeforeD'25mg'xUNZcgDgZGhHje$  daily Today she reports feeling well.  IV dye extravasated and she has soft tissue swelling at the injection sites.    . Review of Systems  Constitutional:  Positive for fatigue. Negative for appetite change, chills and fever.  HENT:   Negative for hearing loss and voice change.   Eyes:  Negative for eye problems.  Respiratory:  Negative for chest tightness and cough.   Cardiovascular:  Negative for chest pain and leg swelling.  Gastrointestinal:  Negative for abdominal distention, abdominal pain and blood in stool.  Endocrine: Negative for hot flashes.  Genitourinary:  Negative for difficulty urinating and frequency.   Musculoskeletal:  Negative for arthralgias.  Skin:  Negative for itching and rash.  Neurological:  Negative for extremity weakness.  Hematological:  Negative for adenopathy.  Psychiatric/Behavioral:  Negative for confusion.      [name] MEDICAL HISTORY: Past Medical History:  Diagnosis Date   Dysrhythmia    Genetic testing 03/28/2017   Multi-Cancer panel (83 genes) @ [facility] - No pathogenic mutations detected   High grade ovarian cancer ([place]) 11/20/2016   Pelvic mass  in female     SURGICAL HISTORY: Past Surgical History:  Procedure Laterality Date   APPENDECTOMY     LAPAROSCOPY N/A 03/14/2017   Procedure: LAPAROSCOPY OPERATIVE;  Surgeon: Mellody Drown, MD;  Location: [facility];  Service: Gynecology;  Laterality: N/A;   LAPAROTOMY N/A 03/14/2017   Procedure: LAPAROTOMY;  Surgeon: Mellody Drown, MD;  Location: [facility];  Service: Gynecology;  Laterality: N/A;   LYMPH NODE DISSECTION N/A 03/14/2017   Procedure: LYMPH NODE DISSECTION;  Surgeon: Mellody Drown, MD;  Location: [facility];   Service: Gynecology;  Laterality: N/A;   OMENTECTOMY N/A 03/14/2017   Procedure: OMENTECTOMY;  Surgeon: Mellody Drown, MD;  Location: [facility];  Service: Gynecology;  Laterality: N/A;   PORTA CATH INSERTION N/A 11/27/2016   Procedure: Glori Luis Cath Insertion;  Surgeon: Algernon Huxley, MD;  Location: [place] [facility];  Service: Cardiovascular;  Laterality: N/A;    SOCIAL HISTORY: Social History   Tobacco Use   Smoking status: Never   Smokeless tobacco: Never  Vaping Use   Vaping Use: Never used  Substance Use Topics   Alcohol use: Not Currently   Drug use: No     FAMILY HISTORY Family History  Problem Relation Age of Onset   Throat cancer Cousin    Throat cancer Cousin    Leukemia Cousin     ALLERGIES:  is allergic to omeprazole.  MEDICATIONS:  Current Outpatient Medications  Medication Sig Dispense Refill   amLODipine (NORVASC) 5 MG tablet Take 1 tablet (5 mg total) by mouth daily. 30 tablet 1   levothyroxine (SYNTHROID) 100 MCG tablet Take 1 tablet (100 mcg total) by mouth daily. 90 tablet 3   lidocaine-prilocaine (EMLA) cream Apply 1 application topically as needed. Apply small amount to port site at least 1 hour prior to it being accessed, cover with plastic wrap 30 g 1   losartan (COZAAR) 25 MG tablet Take 1 tablet (25 mg total) by mouth daily. 90 tablet 0   ondansetron (ZOFRAN) 8 MG tablet Take 1 tablet (8 mg total) by mouth every 8 (eight) hours as needed for nausea or vomiting. 60 tablet 1   No current facility-administered medications for this visit.   Facility-Administered Medications Ordered in Other Visits  Medication Dose Route Frequency Provider Last Rate Last Admin   dextrose 5 % solution   Intravenous Once Earlie Server, MD        PHYSICAL EXAMINATION:  ECOG PERFORMANCE STATUS: 0 - Asymptomatic Vitals:   09/23/21 0838  BP: (!) 166/96  Pulse: 79  Temp: (!) 96.5 F (35.8 C)    Filed Weights   09/23/21 0838  Weight: 113 lb (51.3 kg)     Physical  Exam Constitutional:      General: She is not in acute distress.    Appearance: She is not diaphoretic.     Comments: Thin built, she walks independantly  HENT:     Head: Normocephalic and atraumatic.     Nose: Nose normal.     Mouth/Throat:     Pharynx: No oropharyngeal exudate.  Eyes:     General: No scleral icterus.    Pupils: Pupils are equal, round, and reactive to light.  Neck:     Vascular: No JVD.  Cardiovascular:     Rate and Rhythm: Normal rate and regular rhythm.     Heart sounds: No murmur heard. Pulmonary:     Effort: Pulmonary effort is normal. No respiratory distress.     Breath sounds: Normal breath  sounds. No rales.  Chest:     Chest wall: No tenderness.  Abdominal:     General: Bowel sounds are normal. There is no distension.     Palpations: Abdomen is soft.     Tenderness: There is no abdominal tenderness.  Musculoskeletal:        General: Normal range of motion.     Cervical back: Normal range of motion and neck supple.     Comments: Trace edema  Lymphadenopathy:     Cervical: No cervical adenopathy.  Skin:    General: Skin is warm and dry.     Findings: Erythema present. No rash.     Comments: Right anterior medi port +   Neurological:     Mental Status: She is alert and oriented to person, place, and time.     Cranial Nerves: No cranial nerve deficit.     Motor: No abnormal muscle tone.     Coordination: Coordination normal.  Psychiatric:        Mood and Affect: Affect normal.        Judgment: Judgment normal.        LABORATORY DATA: I have personally reviewed the data as listed: CBC    Component Value Date/Time   WBC 4.9 09/23/2021 0817   RBC 2.57 (L) 09/23/2021 0817   HGB 8.9 (L) 09/23/2021 0817   HCT 26.8 (L) 09/23/2021 0817   PLT 100 (L) 09/23/2021 0817   MCV 104.3 (H) 09/23/2021 0817   MCH 34.6 (H) 09/23/2021 0817   MCHC 33.2 09/23/2021 0817   RDW 15.6 (H) 09/23/2021 0817   LYMPHSABS 0.8 09/23/2021 0817   MONOABS 0.6  09/23/2021 0817   EOSABS 0.1 09/23/2021 0817   BASOSABS 0.0 09/23/2021 0817      Latest Ref Rng & Units 09/23/2021    8:17 AM 08/26/2021    8:09 AM 07/29/2021    8:18 AM  CMP  Glucose 70 - 99 mg/dL 88  102  90   BUN 8 - 23 mg/dL _0 Creatinine 0.44 - 1.00 mg/dL 0.80  0.64  0.64   Sodium 135 - 145 mmol/L 132  133  131   Potassium 3.5 - 5.1 mmol/L 4.0  3.8  3.8   Chloride 98 - 111 mmol/L 104  104  103   CO2 22 - 32 mmol/L _1 Calcium 8.9 - 10.3 mg/dL 9.1  9.3  9.4   Total Protein 6.5 - 8.1 g/dL 6.8  6.4  6.9   Total Bilirubin 0.3 - 1.2 mg/dL 0.7  0.7  0.7   Alkaline Phos 38 - 126 U/L 60  51  58   AST 15 - 41 U/L _2 ALT 0 - 44 U/L _3 RADIOGRAPHIC STUDIES: I have personally reviewed the radiological images as listed and agreed with the findings in the report. CT CHEST ABDOMEN PELVIS W CONTRAST  Result Date: 09/20/2021 CLINICAL DATA:  Ovarian cancer restaging.  05/16/2021 EXAM: CT CHEST, ABDOMEN, AND PELVIS WITH CONTRAST TECHNIQUE: Multidetector CT imaging of the chest, abdomen and pelvis was performed following the standard protocol during bolus administration of intravenous contrast. RADIATION DOSE REDUCTION: This exam was performed according to the departmental dose-optimization program which includes automated exposure control, adjustment of the mA and/or kV according to patient size and/or use of iterative reconstruction technique. CONTRAST:  154m OMNIPAQUE IOHEXOL 300  MG/ML  SOLN COMPARISON:  05/16/2021 FINDINGS: CT CHEST FINDINGS Cardiovascular: Mild cardiac enlargement. No pericardial effusion. Mild aortic atherosclerosis. Mediastinum/Nodes: No enlarged supraclavicular, axillary, mediastinal, or hilar lymph nodes. Trachea, thyroid gland and esophagus demonstrate no significant findings. Lungs/Pleura: No pleural effusions. Stable 6 mm left lower lobe lung nodule, image 87/5. No new or enlarging lung nodules. Musculoskeletal: No chest wall mass or  suspicious bone lesions identified. CT ABDOMEN PELVIS FINDINGS Hepatobiliary: No focal liver abnormality is seen. No gallstones, gallbladder wall thickening, or biliary dilatation. Pancreas: Unremarkable. No pancreatic ductal dilatation or surrounding inflammatory changes. Spleen: Normal in size without focal abnormality. Adrenals/Urinary Tract: Normal adrenal glands. No kidney mass or hydronephrosis. Unchanged bilateral too small to reliably characterize low-density kidney lesions compatible with Bosniak class 2 cysts. No follow-up recommended. No nephrolithiasis or hydronephrosis. Urinary bladder appears normal. Stomach/Bowel: Small hiatal hernia. No bowel wall thickening, inflammation, or distension. Vascular/Lymphatic: Aortic atherosclerosis.  No aneurysm. The previous index left periaortic retroperitoneal lymph node measures 1.1 cm, image 65/2. Previously 1.3 cm. 1 cm aortocaval lymph node is unchanged, image 72/2. No enlarged pelvic or inguinal lymph nodes. Reproductive: Status post hysterectomy. No adnexal masses. Other: Status post omentectomy. Previously referenced low-density lesion within the right posterior pelvis measures 3.0 x 1.4 cm, image 106/2. Previously 3.3 x 1.4 cm. No significant ascites. Musculoskeletal: No acute or significant osseous findings. IMPRESSION: 1. No significant interval changed compared with the previous exam. 2. Stable appearance of soft tissue attenuating lesion in the posterolateral right hemipelvis. This may represent a treated site of peritoneal disease. 3. Unchanged appearance of borderline enlarged left periaortic and aortocaval retroperitoneal lymph nodes. 4. Unchanged 6 mm left lower lobe lung nodule. 5. No new signs of metastatic disease within the chest, abdomen or pelvis. 6. Aortic Atherosclerosis (ICD10-I70.0) and Emphysema (ICD10-J43.9). * Tracking Code: BO * Electronically Signed   By: Kerby Moors M.D.   On: 09/20/2021 08:18

## 2021-09-23 NOTE — Patient Instructions (Signed)
[facility] AT [place]  Discharge Instructions: Thank you for choosing [facility] to provide your oncology and hematology care.  If you have a lab appointment with the [facility], please go directly to the [facility] and check in at the registration area.  Wear comfortable clothing and clothing appropriate for easy access to any Portacath or PICC line.   We strive to give you quality time with your provider. You may need to reschedule your appointment if you arrive late (15 or more minutes).  Arriving late affects you and other patients whose appointments are after yours.  Also, if you miss three or more appointments without notifying the office, you may be dismissed from the clinic at the provider's discretion.      For prescription refill requests, have your pharmacy contact our office and allow 72 hours for refills to be completed.    Today you received the following chemotherapy and/or immunotherapy agents DOXIL       To help prevent nausea and vomiting after your treatment, we encourage you to take your nausea medication as directed.  BELOW ARE SYMPTOMS THAT SHOULD BE REPORTED IMMEDIATELY: *FEVER GREATER THAN 100.4 F (38 C) OR HIGHER *CHILLS OR SWEATING *NAUSEA AND VOMITING THAT IS NOT CONTROLLED WITH YOUR NAUSEA MEDICATION *UNUSUAL SHORTNESS OF BREATH *UNUSUAL BRUISING OR BLEEDING *URINARY PROBLEMS (pain or burning when urinating, or frequent urination) *BOWEL PROBLEMS (unusual diarrhea, constipation, pain near the anus) TENDERNESS IN MOUTH AND THROAT WITH OR WITHOUT PRESENCE OF ULCERS (sore throat, sores in mouth, or a toothache) UNUSUAL RASH, SWELLING OR PAIN  UNUSUAL VAGINAL DISCHARGE OR ITCHING   Items with * indicate a potential emergency and should be followed up as soon as possible or go to the Emergency Department if any problems should occur.  Please show the CHEMOTHERAPY ALERT CARD or IMMUNOTHERAPY ALERT CARD at check-in to the  Emergency Department and triage nurse.  Should you have questions after your visit or need to cancel or reschedule your appointment, please contact [facility] [place] [facility] [place]  [phone] and follow the prompts.  Office hours are 8:00 a.m. to 4:30 p.m. Monday - Friday. Please note that voicemails left after 4:00 p.m. may not be returned until the following business day.  We are closed weekends and major holidays. You have access to a nurse at all times for urgent questions. Please call the main number to the clinic [phone] and follow the prompts.  For any non-urgent questions, you may also contact your provider using MyChart. We now offer e-Visits for anyone 38 and older to request care online for non-urgent symptoms. For details visit [email or web address].   Also download the MyChart app! Go to the app store, search "MyChart", open the app, select [facility], and log in with your MyChart username and password.  Masks are optional in the cancer centers. If you would like for your care team to wear a mask while they are taking care of you, please let them know. For doctor visits, patients may have with them one support person who is at least 70 years old. At this time, visitors are not allowed in the infusion area.   Doxorubicin Liposomal injection What is this medication? LIPOSOMAL DOXORUBICIN (LIP oh som al  dox oh ROO bi sin) is a chemotherapy drug. This medicine is used to treat many kinds of cancer like Kaposi's sarcoma, multiple myeloma, and ovarian cancer. This medicine may be used for other purposes; ask your health care provider  or pharmacist if you have questions. COMMON BRAND NAME(S): Doxil, Lipodox What should I tell my care team before I take this medication? They need to know if you have any of these conditions: blood disorders heart disease infection (especially a virus infection such as chickenpox, cold sores, or herpes) liver disease recent or ongoing  radiation therapy an unusual or allergic reaction to doxorubicin, other chemotherapy agents, soybeans, other medicines, foods, dyes, or preservatives pregnant or trying to get pregnant breast-feeding How should I use this medication? This drug is given as an infusion into a vein. It is administered in a hospital or clinic by a specially trained health care professional. If you have pain, swelling, burning or any unusual feeling around the site of your injection, tell your health care professional right away. Talk to your pediatrician regarding the use of this medicine in children. Special care may be needed. Overdosage: If you think you have taken too much of this medicine contact a poison control center or emergency room at once. NOTE: This medicine is only for you. Do not share this medicine with others. What if I miss a dose? It is important not to miss your dose. Call your doctor or health care professional if you are unable to keep an appointment. What may interact with this medication? Do not take this medicine with any of the following medications: zidovudine This medicine may also interact with the following medications: medicines to increase blood counts like filgrastim, pegfilgrastim, sargramostim vaccines Talk to your doctor or health care professional before taking any of these medicines: acetaminophen aspirin ibuprofen ketoprofen naproxen This list may not describe all possible interactions. Give your health care provider a list of all the medicines, herbs, non-prescription drugs, or dietary supplements you use. Also tell them if you smoke, drink alcohol, or use illegal drugs. Some items may interact with your medicine. What should I watch for while using this medication? Your condition will be monitored carefully while you are receiving this medicine. You may need blood work done while you are taking this medicine. This drug may make you feel generally unwell. This is not  uncommon, as chemotherapy can affect healthy cells as well as cancer cells. Report any side effects. Continue your course of treatment even though you feel ill unless your doctor tells you to stop. Your urine may turn orange-red for a few days after your dose. This is not blood. If your urine is dark or brown, call your doctor. In some cases, you may be given additional medicines to help with side effects. Follow all directions for their use. Talk to your doctor about your risk of cancer. You may be more at risk for certain types of cancers if you take this medicine. Do not become pregnant while taking this medicine or for 6 months after stopping it. Women should inform their healthcare professional if they wish to become pregnant or think they may be pregnant. Men should not father a child while taking this medicine and for 6 months after stopping it. There is a potential for serious side effects to an unborn child. Talk to your health care professional or pharmacist for more information. Do not breast-feed an infant while taking this medicine. This medicine has caused ovarian failure in some women. This medicine may make it more difficult to get pregnant. Talk to your healthcare professional if you are concerned about your fertility. This medicine has caused decreased sperm counts in some men. This may make it more difficult to father  a child. Talk to your healthcare professional if you are concerned about your fertility. This medicine may cause a decrease in Co-Enzyme Q-10. You should make sure that you get enough Co-Enzyme Q-10 while you are taking this medicine. Discuss the foods you eat and the vitamins you take with your health care professional. What side effects may I notice from receiving this medication? Side effects that you should report to your doctor or health care professional as soon as possible: allergic reactions like skin rash, itching or hives, swelling of the face, lips, or  tongue low blood counts - this medicine may decrease the number of white blood cells, red blood cells and platelets. You may be at increased risk for infections and bleeding. signs of hand-foot syndrome - tingling or burning, redness, flaking, swelling, small blisters, or small sores on the palms of your hands or the soles of your feet signs of infection - fever or chills, cough, sore throat, pain or difficulty passing urine signs of decreased platelets or bleeding - bruising, pinpoint red spots on the skin, black, tarry stools, blood in the urine signs of decreased red blood cells - unusually weak or tired, fainting spells, lightheadedness back pain, chills, facial flushing, fever, headache, tightness in the chest or throat during the infusion breathing problems chest pain fast, irregular heartbeat mouth pain, redness, sores pain, swelling, redness at site where injected pain, tingling, numbness in the hands or feet swelling of ankles, feet, or hands vomiting Side effects that usually do not require medical attention (report to your doctor or health care professional if they continue or are bothersome): diarrhea hair loss loss of appetite nail discoloration or damage nausea red or watery eyes red colored urine stomach upset This list may not describe all possible side effects. Call your doctor for medical advice about side effects. You may report side effects to FDA at 1-800-FDA-1088. Where should I keep my medication? This drug is given in a hospital or clinic and will not be stored at home. NOTE: This sheet is a summary. It may not cover all possible information. If you have questions about this medicine, talk to your doctor, pharmacist, or health care provider.  2023 [employer]vier/Gold Standard (2017-12-05 00:00:00)

## 2021-09-23 NOTE — Assessment & Plan Note (Signed)
Continue hold bevacizumab.  Monitor monthly.

## 2021-09-23 NOTE — Assessment & Plan Note (Signed)
Secondary to chemotherapy.  Hemoglobin trended down slightly. Continue monitor

## 2021-09-23 NOTE — Assessment & Plan Note (Addendum)
Labs reviewed and discussed with patient. CT showed stable disease Proceed with Doxil today.  Continue to hold bevacizumab. Repeat echocardiogram

## 2021-09-23 NOTE — Progress Notes (Signed)
Per MD ok to treat with most recent ECHO. Will order another.

## 2021-09-24 LAB — CA 125: Cancer Antigen (CA) 125: 15.7 U/mL (ref 0.0–38.1)

## 2021-10-14 ENCOUNTER — Ambulatory Visit (INDEPENDENT_AMBULATORY_CARE_PROVIDER_SITE_OTHER): Payer: Medicare Other

## 2021-10-14 DIAGNOSIS — C569 Malignant neoplasm of unspecified ovary: Secondary | ICD-10-CM | POA: Diagnosis not present

## 2021-10-14 DIAGNOSIS — Z0189 Encounter for other specified special examinations: Secondary | ICD-10-CM

## 2021-10-14 DIAGNOSIS — Z79899 Other long term (current) drug therapy: Secondary | ICD-10-CM

## 2021-10-14 DIAGNOSIS — Z5181 Encounter for therapeutic drug level monitoring: Secondary | ICD-10-CM | POA: Diagnosis not present

## 2021-10-14 LAB — ECHOCARDIOGRAM COMPLETE
AR max vel: 3.36 cm2
AV Area VTI: 3.06 cm2
AV Area mean vel: 2.87 cm2
AV Mean grad: 2 mmHg
AV Peak grad: 4.1 mmHg
AV Vena cont: 0.2 cm
Ao pk vel: 1.01 m/s
Area-P 1/2: 4.99 cm2
Calc EF: 54.3 %
S' Lateral: 3.6 cm
Single Plane A2C EF: 55.7 %
Single Plane A4C EF: 52.5 %

## 2021-10-19 ENCOUNTER — Other Ambulatory Visit (HOSPITAL_COMMUNITY): Payer: Medicare Other

## 2021-10-20 ENCOUNTER — Other Ambulatory Visit: Payer: Self-pay | Admitting: Oncology

## 2021-10-20 ENCOUNTER — Telehealth: Payer: Self-pay

## 2021-10-20 NOTE — Telephone Encounter (Signed)
-----   Message from Stephens November sent at 10/20/2021 10:09 AM EDT ----- Spoke with pt to notify her of appts for 8/11, pt confirmed appts and also confirmed that she would be here for treatment on 7/14 ----- Message ----- From: Earlie Server, MD Sent: 10/20/2021   1:34 AM EDT To: Evelina Dun, RN; Stephens November; #  If she get chemo on 7/14, please schedule lab MD Doxil with onpro on 8/11

## 2021-10-21 ENCOUNTER — Encounter: Payer: Self-pay | Admitting: Nurse Practitioner

## 2021-10-21 ENCOUNTER — Inpatient Hospital Stay: Payer: Medicare Other | Attending: Oncology

## 2021-10-21 ENCOUNTER — Inpatient Hospital Stay: Payer: Medicare Other

## 2021-10-21 ENCOUNTER — Inpatient Hospital Stay (HOSPITAL_BASED_OUTPATIENT_CLINIC_OR_DEPARTMENT_OTHER): Payer: Medicare Other | Admitting: Nurse Practitioner

## 2021-10-21 ENCOUNTER — Other Ambulatory Visit: Payer: Self-pay

## 2021-10-21 VITALS — BP 172/86 | HR 76 | Temp 98.0°F | Wt 111.0 lb

## 2021-10-21 DIAGNOSIS — Z5111 Encounter for antineoplastic chemotherapy: Secondary | ICD-10-CM | POA: Diagnosis not present

## 2021-10-21 DIAGNOSIS — C569 Malignant neoplasm of unspecified ovary: Secondary | ICD-10-CM

## 2021-10-21 DIAGNOSIS — R809 Proteinuria, unspecified: Secondary | ICD-10-CM | POA: Insufficient documentation

## 2021-10-21 DIAGNOSIS — D6481 Anemia due to antineoplastic chemotherapy: Secondary | ICD-10-CM

## 2021-10-21 DIAGNOSIS — T451X5A Adverse effect of antineoplastic and immunosuppressive drugs, initial encounter: Secondary | ICD-10-CM | POA: Diagnosis not present

## 2021-10-21 DIAGNOSIS — Z5189 Encounter for other specified aftercare: Secondary | ICD-10-CM | POA: Diagnosis not present

## 2021-10-21 DIAGNOSIS — C7800 Secondary malignant neoplasm of unspecified lung: Secondary | ICD-10-CM | POA: Diagnosis not present

## 2021-10-21 DIAGNOSIS — I119 Hypertensive heart disease without heart failure: Secondary | ICD-10-CM | POA: Diagnosis not present

## 2021-10-21 LAB — CBC WITH DIFFERENTIAL/PLATELET
Abs Immature Granulocytes: 0.01 K/uL (ref 0.00–0.07)
Basophils Absolute: 0 K/uL (ref 0.0–0.1)
Basophils Relative: 1 %
Eosinophils Absolute: 0.1 K/uL (ref 0.0–0.5)
Eosinophils Relative: 2 %
HCT: 24.8 % — ABNORMAL LOW (ref 36.0–46.0)
Hemoglobin: 8.2 g/dL — ABNORMAL LOW (ref 12.0–15.0)
Immature Granulocytes: 0 %
Lymphocytes Relative: 20 %
Lymphs Abs: 0.9 K/uL (ref 0.7–4.0)
MCH: 34.9 pg — ABNORMAL HIGH (ref 26.0–34.0)
MCHC: 33.1 g/dL (ref 30.0–36.0)
MCV: 105.5 fL — ABNORMAL HIGH (ref 80.0–100.0)
Monocytes Absolute: 0.5 K/uL (ref 0.1–1.0)
Monocytes Relative: 13 %
Neutro Abs: 2.8 K/uL (ref 1.7–7.7)
Neutrophils Relative %: 64 %
Platelets: 111 K/uL — ABNORMAL LOW (ref 150–400)
RBC: 2.35 MIL/uL — ABNORMAL LOW (ref 3.87–5.11)
RDW: 15.7 % — ABNORMAL HIGH (ref 11.5–15.5)
WBC: 4.3 K/uL (ref 4.0–10.5)
nRBC: 0 % (ref 0.0–0.2)

## 2021-10-21 LAB — COMPREHENSIVE METABOLIC PANEL
ALT: 9 U/L (ref 0–44)
AST: 18 U/L (ref 15–41)
Albumin: 3.8 g/dL (ref 3.5–5.0)
Alkaline Phosphatase: 56 U/L (ref 38–126)
Anion gap: 6 (ref 5–15)
BUN: 22 mg/dL (ref 8–23)
CO2: 22 mmol/L (ref 22–32)
Calcium: 9.2 mg/dL (ref 8.9–10.3)
Chloride: 105 mmol/L (ref 98–111)
Creatinine, Ser: 0.99 mg/dL (ref 0.44–1.00)
GFR, Estimated: >60 mL/min (ref >60)
Glucose, Bld: 90 mg/dL (ref 70–99)
Potassium: 3.7 mmol/L (ref 3.5–5.1)
Sodium: 133 mmol/L — ABNORMAL LOW (ref 135–145)
Total Bilirubin: 1 mg/dL (ref 0.3–1.2)
Total Protein: 6.8 g/dL (ref 6.5–8.1)

## 2021-10-21 MED ORDER — DOXORUBICIN HCL LIPOSOMAL CHEMO INJECTION 2 MG/ML
40.0000 mg/m2 | Freq: Once | INTRAVENOUS | Status: AC
  Administered 2021-10-21: 64 mg via INTRAVENOUS
  Filled 2021-10-21: qty 10

## 2021-10-21 MED ORDER — HEPARIN SOD (PORK) LOCK FLUSH 100 UNIT/ML IV SOLN
500.0000 [IU] | Freq: Once | INTRAVENOUS | Status: AC | PRN
  Administered 2021-10-21: 500 [IU]
  Filled 2021-10-21: qty 5

## 2021-10-21 MED ORDER — LOSARTAN POTASSIUM 25 MG PO TABS: 25.0000 mg | ORAL_TABLET | Freq: Every day | ORAL | 0 refills | Status: DC

## 2021-10-21 MED ORDER — PEGFILGRASTIM 6 MG/0.6ML ~~LOC~~ PSKT
6.0000 mg | PREFILLED_SYRINGE | Freq: Once | SUBCUTANEOUS | Status: AC
  Administered 2021-10-21: 6 mg via SUBCUTANEOUS
  Filled 2021-10-21: qty 0.6

## 2021-10-21 MED ORDER — DEXTROSE 5 % IV SOLN
Freq: Once | INTRAVENOUS | Status: AC
  Filled 2021-10-21: qty 250

## 2021-10-21 MED ORDER — AMLODIPINE BESYLATE 5 MG PO TABS: 5.0000 mg | ORAL_TABLET | Freq: Every day | ORAL | 1 refills | Status: DC

## 2021-10-21 NOTE — Progress Notes (Signed)
Patient here for oncology follow-up appointment, expresses no complaints or concerns at this time.    

## 2021-10-21 NOTE — Progress Notes (Signed)
[place] at [facility]  Hematology/Oncology Progress Note [phone] [phone]  Patient Care Team: Cletis Athens, MD as PCP - General (Internal Medicine) Clent Jacks, [name], [name], [name], MD as Referring Physician (Obstetrics and Gynecology)  CHIEF COMPLAINTS/PURPOSE OF Visit Follow up for chemotherapy for ovarian cancer.  HISTORY OF PRESENTING ILLNESS: Katie Hill 71 y.o. female presents for follow up of management of recurrent  ovarian cancer. Oncology History  Malignant neoplasm of ovary ([place])  09/20/2011 Imaging   CT chest abdomen pelvis w contrast 1. No significant interval changed compared with the previous exam.2. Stable appearance of soft tissue attenuating lesion in the posterolateral right hemipelvis. This may represent a treated site of peritoneal disease.3. Unchanged appearance of borderline enlarged left periaortic and aortocaval retroperitoneal lymph nodes.4. Unchanged 6 mm left lower lobe lung nodule. 5. No new signs of metastatic disease within the chest, abdomen or pelvis. 6. Aortic Atherosclerosis (ICD10-I70.0) and Emphysema    11/20/2016 Initial Diagnosis   Malignant neoplasm of ovary   Pathology 11/16/2016 Surgical Pathology  CASE: [id number]  PATIENT: Katie Hill  Surgical Pathology Report   SPECIMEN SUBMITTED:  A. Retroperitoneal adenopathy, left  DIAGNOSIS:  A. LYMPH NODE, LEFT RETROPERITONEAL; CT-GUIDED CORE BIOPSY:  - METASTATIC HIGH-GRADE SEROUS CARCINOMA.  Pathology 03/14/2017   DIAGNOSIS:  A. OMENTUM; OMENTECTOMY:  - NO TUMOR SEEN.  - ONE NEGATIVE LYMPH NODE (0/1).   B.  RIGHT FALLOPIAN TUBE AND OVARY; SALPINGO-OOPHORECTOMY:  - SMALL FOCI OF HIGH GRADE SEROUS CARCINOMA INVOLVING THE OVARY.  - MARKED THERAPY RELATED CHANGE.  - NO  TUMOR SEEN IN THE FALLOPIAN TUBE.   C.  UTERUS, CERVIX, LEFT FALLOPIAN TUBE AND OVARY; HYSTERECTOMY AND LEFT  SALPINGO-OOPHORECTOMY:  - NABOTHIAN CYSTS.  - CYSTIC ATROPHY OF THE ENDOMETRIUM.  - SEROSAL ADHESIONS.  - UNREMARKABLE FALLOPIAN TUBE.  - OVARY SHOWING TREATMENT RELATED CHANGE.   D.  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    12/01/2016 -  Chemotherapy   Carboplatin and taxol x 4 neoadjuvant chemotherapy    03/14/2017 Surgery   Patient had debulking surgery on 03/14/2017. She had a laparoscopy with conversion to laparotomy, total hysterectomy, with bilateral salpingo oophorectomy, right aortic lymph node dissection, omentectomy. Pathology showed small foci of residual disease in ovary.   HRD positive, declined Olarparib maintenance trial    03/28/2017 -  Chemotherapy   Adjuvant carbo and taxol x3 cycles   02/28/2018 Progression   Local Recurrence. CA125 146 CT abdomen pelvis w contrast showed interval enlargement of an aortocaval lymph node which currently measures 1.4 cm in short axis (axial image 75 of series 2). This lymph node is in close proximity to the previously resected retroperitoneal lymphadenopathy and is very concerning for an additional nodal metastasis.    03/15/2018 -  Chemotherapy   03/15/2018-05/17/2018 Carboplatin and Taxol x 4.  CA125 decrease from 49.7 to 6 after 4 cycles of treatment.  05/17/2018-10/29/20   Olarparib 383m BID    11/05/2018 Imaging   MRI abdomenPelvis on 11/05/2018 showed Interval progression of, now measuring 2.7 x 2.3 cm and concerning for progression of metastatic disease.retrocaval lymph node in the abdomen Olaparib was held for short period of time. Her CA125 trended down again.  Dr. BFransisca Connorsrecommending resume olaparib and continue monitor.   10/06/2020 Progression    CT  chest abdomen pelvis with contrast showed new lymph nodes in the prevascular space of  the upper anterior mediastinum most consistent with metastasis.  Single new right lower lobe pulmonary nodule is concerning for early pulmonary metastasis.  Interval enlargement of the left periaortic retroperitoneal lymph node.  Stable adjacent aorto caval adenopathy.   11/03/2020 -  Chemotherapy   11/03/20 -05/20/21 Doxil + Bevacizumab q28d  06/03/21 Doxil only due to proteinuria.  07/01/21-07/15/21  Resumed on Doxil + Bevacizumab 07/29/21 Doxil only due to proteinuria.     Genetic Testing   Genetic testing negative for 83 genes on [facility]'s Multi-Cancer panel (ALK, APC, ATM, AXIN2, BAP1, BARD1, BLM, BMPR1A, BRCA1, BRCA2, BRIP1, CASR, CDC73, CDH1, CDK4, CDKN1B, CDKN1C, CDKN2A, CEBPA, CHEK2, CTNNA1, DICER1, DIS3L2, EGFR, EPCAM, FH, FLCN, GATA2, GPC3, GREM1, HOXB13, HRAS, KIT, MAX, MEN1, MET, MITF, MLH1, MSH2, MSH3, MSH6, MUTYH, NBN, NF1, NF2, NTHL1, PALB2, PDGFRA, PHOX2B, PMS2, POLD1, POLE, POT1, PRKAR1A, PTCH1, PTEN, RAD50, RAD51C, RAD51D, RB1, RECQL4, RET, RUNX1, SDHA, SDHAF2, SDHB, SDHC, SDHD, SMAD4, SMARCA4, SMARCB1, SMARCE1, STK11, SUFU, TERC, TERT, TMEM127, TP53, TSC1, TSC2, VHL, WRN, WT1).   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  Myraid testing negative for somatic BRACA1/2, positive for HRD.    10/14/2021 Echocardiogram    Left ventricular ejection fraction, by estimation, is 55 to 60%   High grade ovarian cancer ([place])  12/04/2019 Initial Diagnosis   High grade ovarian cancer ([place])   11/03/2020 -  Chemotherapy   11/03/20 -05/20/21 Doxil + Bevacizumab q28d  06/03/21 Doxil only due to proteinuria.  07/01/21-07/15/21  Resumed on Doxil + Bevacizumab 07/29/21 Doxil only due to proteinuria.      Patient returns toc linic for evaluationa nd consideration of chemotherapy. Continues to tolerate ttreatment well. Feels well. No new abdominal pain, changes in bowel movements. She was mowing grass yesterday. No complaints. Requests refill of blood pressure medication.    Review of Systems  Constitutional:  Positive for fatigue. Negative for appetite change, chills and fever.  HENT:   Negative for hearing loss and voice change.   Eyes:  Negative for eye problems.  Respiratory:  Negative for chest tightness and cough.   Cardiovascular:  Negative for chest pain and leg swelling.  Gastrointestinal:  Negative for abdominal distention, abdominal pain and blood in stool.  Endocrine: Negative for hot flashes.  Genitourinary:  Negative for difficulty urinating and frequency.   Musculoskeletal:  Negative for arthralgias.  Skin:  Negative for itching and rash.  Neurological:  Negative for extremity weakness.  Hematological:  Negative for adenopathy.  Psychiatric/Behavioral:  Negative for confusion.    MEDICAL HISTORY: Past Medical History:  Diagnosis Date   Dysrhythmia    Genetic testing 03/28/2017   Multi-Cancer panel (83 genes) @ [facility] - No pathogenic mutations detected   High grade ovarian cancer ([place]) 11/20/2016   Pelvic mass in female     SURGICAL HISTORY: Past Surgical History:  Procedure Laterality Date   APPENDECTOMY     LAPAROSCOPY N/A 03/14/2017   Procedure: LAPAROSCOPY OPERATIVE;  Surgeon: Mellody Drown, MD;  Location: [facility];  Service: Gynecology;  Laterality: N/A;   LAPAROTOMY N/A 03/14/2017   Procedure: LAPAROTOMY;  Surgeon: Mellody Drown, MD;  Location: [facility];  Service: Gynecology;  Laterality: N/A;   LYMPH NODE DISSECTION N/A 03/14/2017   Procedure: LYMPH NODE DISSECTION;  Surgeon: Mellody Drown, MD;  Location: [facility];  Service: Gynecology;  Laterality: N/A;   OMENTECTOMY N/A 03/14/2017   Procedure: OMENTECTOMY;  Surgeon: Mellody Drown,  MD;  Location: [facility];  Service: Gynecology;  Laterality: N/A;   PORTA CATH INSERTION N/A 11/27/2016   Procedure: Glori Luis Cath Insertion;  Surgeon: Algernon Huxley, MD;  Location: [facility];  Service: Cardiovascular;  Laterality: N/A;    SOCIAL HISTORY: Social History    Tobacco Use   Smoking status: Never   Smokeless tobacco: Never  Vaping Use   Vaping Use: Never used  Substance Use Topics   Alcohol use: Not Currently   Drug use: No     FAMILY HISTORY Family History  Problem Relation Age of Onset   Throat cancer Cousin    Throat cancer Cousin    Leukemia Cousin     ALLERGIES:  is allergic to omeprazole.  MEDICATIONS:  Current Outpatient Medications  Medication Sig Dispense Refill   amLODipine (NORVASC) 5 MG tablet Take 1 tablet (5 mg total) by mouth daily. 30 tablet 1   levothyroxine (SYNTHROID) 100 MCG tablet Take 1 tablet (100 mcg total) by mouth daily. 90 tablet 3   lidocaine-prilocaine (EMLA) cream Apply 1 application topically as needed. Apply small amount to port site at least 1 hour prior to it being accessed, cover with plastic wrap 30 g 1   losartan (COZAAR) 25 MG tablet Take 1 tablet (25 mg total) by mouth daily. 90 tablet 0   ondansetron (ZOFRAN) 8 MG tablet Take 1 tablet (8 mg total) by mouth every 8 (eight) hours as needed for nausea or vomiting. 60 tablet 1   No current facility-administered medications for this visit.    PHYSICAL EXAMINATION:  ECOG PERFORMANCE STATUS: 0 - Asymptomatic Vitals:   10/21/21 1305  BP: (!) 172/86  Pulse: 76  Temp: 98 F (36.7 C)  SpO2: 100%    Filed Weights   10/21/21 1305  Weight: 111 lb (50.3 kg)     Physical Exam Constitutional:      General: She is not in acute distress.    Appearance: She is not diaphoretic.     Comments: Thin built, she walks independantly  HENT:     Head: Normocephalic and atraumatic.     Mouth/Throat:     Pharynx: No oropharyngeal exudate.  Eyes:     General: No scleral icterus. Neck:     Vascular: No JVD.  Cardiovascular:     Rate and Rhythm: Normal rate and regular rhythm.     Heart sounds: No murmur heard. Pulmonary:     Effort: Pulmonary effort is normal. No respiratory distress.     Breath sounds: Normal breath sounds. No rales.  Chest:      Chest wall: No tenderness.  Abdominal:     General: Bowel sounds are normal. There is no distension.     Palpations: Abdomen is soft.     Tenderness: There is no abdominal tenderness.  Musculoskeletal:        General: Normal range of motion.     Cervical back: Normal range of motion and neck supple.     Comments: Trace edema  Lymphadenopathy:     Cervical: No cervical adenopathy.  Skin:    General: Skin is warm and dry.     Findings: No erythema or rash.     Comments: Right anterior medi port +   Neurological:     Mental Status: She is alert and oriented to person, place, and time.     Cranial Nerves: No cranial nerve deficit.     Motor: No abnormal muscle tone.     Coordination:  Coordination normal.  Psychiatric:        Mood and Affect: Affect normal.        Behavior: Behavior normal.        Judgment: Judgment normal.      LABORATORY DATA: I have personally reviewed the data as listed: CBC    Component Value Date/Time   WBC 4.3 10/21/2021 1239   RBC 2.35 (L) 10/21/2021 1239   HGB 8.2 (L) 10/21/2021 1239   HCT 24.8 (L) 10/21/2021 1239   PLT 111 (L) 10/21/2021 1239   MCV 105.5 (H) 10/21/2021 1239   MCH 34.9 (H) 10/21/2021 1239   MCHC 33.1 10/21/2021 1239   RDW 15.7 (H) 10/21/2021 1239   LYMPHSABS 0.9 10/21/2021 1239   MONOABS 0.5 10/21/2021 1239   EOSABS 0.1 10/21/2021 1239   BASOSABS 0.0 10/21/2021 1239      Latest Ref Rng & Units 10/21/2021   12:39 PM 09/23/2021    8:17 AM 08/26/2021    8:09 AM  CMP  Glucose 70 - 99 mg/dL 90  88  102   BUN 8 - 23 mg/dL $Remove'22  16  13   'dlSUfqY$ Creatinine 0.44 - 1.00 mg/dL 0.99  0.80  0.64   Sodium 135 - 145 mmol/L 133  132  133   Potassium 3.5 - 5.1 mmol/L 3.7  4.0  3.8   Chloride 98 - 111 mmol/L 105  104  104   CO2 22 - 32 mmol/L $RemoveB'22  22  23   'yJQdRtGG$ Calcium 8.9 - 10.3 mg/dL 9.2  9.1  9.3   Total Protein 6.5 - 8.1 g/dL 6.8  6.8  6.4   Total Bilirubin 0.3 - 1.2 mg/dL 1.0  0.7  0.7   Alkaline Phos 38 - 126 U/L 56  60  51   AST 15 - 41 U/L  $Re'18  18  19   'XKs$ ALT 0 - 44 U/L $Remo'9  9  9      'jjmTG$ RADIOGRAPHIC STUDIES: I have personally reviewed the radiological images as listed and agreed with the findings in the report. ECHOCARDIOGRAM COMPLETE  Result Date: 10/14/2021    ECHOCARDIOGRAM REPORT   Patient Name:   Katie Hill Date of Exam: 10/14/2021 Medical Rec #:  [id number]        Height:       67.0 in Accession #:    [id number]       Weight:       113.0 lb Date of Birth:  [birth year]        BSA:          1.587 m Patient Age:    71 years         BP:           158/92 mmHg Patient Gender: F                HR:           76 bpm. Exam Location:  [facility] Procedure: 2D Echo, Cardiac Doppler and Color Doppler Indications:    Z09, Cardiotoxic drug therapy  History:        Patient has prior history of Echocardiogram examinations, most                 recent 01/14/2021. Risk Factors:Hypertension and Non-Smoker.                 Currently receiving chemotherapy.  Sonographer:    Pilar Jarvis RDMS, RVT, RDCS Referring Phys: [id number] [facility]  Sonographer Comments:  Global longitudinal strain was attempted. Very limited acoustic windows IMPRESSIONS  1. Left ventricular ejection fraction, by estimation, is 55 to 60%. The left ventricle has normal function. The left ventricle has no regional wall motion abnormalities. Left ventricular diastolic parameters were normal. The average left ventricular global longitudinal strain is -13.8 %.  2. Right ventricular systolic function is normal. The right ventricular size is normal. There is normal pulmonary artery systolic pressure. The estimated right ventricular systolic pressure is 63.1 mmHg.  3. The mitral valve is normal in structure. Mild mitral valve regurgitation. No evidence of mitral stenosis.  4. The aortic valve is normal in structure. Aortic valve regurgitation is mild. No aortic stenosis is present.  5. The inferior vena cava is normal in size with greater than 50% respiratory variability, suggesting right atrial pressure  of 3 mmHg. FINDINGS  Left Ventricle: Left ventricular ejection fraction, by estimation, is 55 to 60%. The left ventricle has normal function. The left ventricle has no regional wall motion abnormalities. The average left ventricular global longitudinal strain is -13.8 %. The left ventricular internal cavity size was normal in size. There is no left ventricular hypertrophy. Left ventricular diastolic parameters were normal. Right Ventricle: The right ventricular size is normal. No increase in right ventricular wall thickness. Right ventricular systolic function is normal. There is normal pulmonary artery systolic pressure. The tricuspid regurgitant velocity is 2.38 m/s, and  with an assumed right atrial pressure of 5 mmHg, the estimated right ventricular systolic pressure is 49.7 mmHg. Left Atrium: Left atrial size was normal in size. Right Atrium: Right atrial size was normal in size. Pericardium: There is no evidence of pericardial effusion. Mitral Valve: The mitral valve is normal in structure. Mild mitral valve regurgitation. No evidence of mitral valve stenosis. Tricuspid Valve: The tricuspid valve is normal in structure. Tricuspid valve regurgitation is mild . No evidence of tricuspid stenosis. Aortic Valve: The aortic valve is normal in structure. Aortic valve regurgitation is mild. No aortic stenosis is present. Aortic valve mean gradient measures 2.0 mmHg. Aortic valve peak gradient measures 4.1 mmHg. Aortic valve area, by VTI measures 3.06 cm. Pulmonic Valve: The pulmonic valve was normal in structure. Pulmonic valve regurgitation is not visualized. No evidence of pulmonic stenosis. Aorta: The aortic root is normal in size and structure. Venous: The inferior vena cava is normal in size with greater than 50% respiratory variability, suggesting right atrial pressure of 3 mmHg. IAS/Shunts: No atrial level shunt detected by color flow Doppler.  LEFT VENTRICLE PLAX 2D LVIDd:         5.00 cm     Diastology  LVIDs:         3.60 cm     LV e' medial:    7.83 cm/s LV PW:         1.10 cm     LV E/e' medial:  10.9 LV IVS:        1.00 cm     LV e' lateral:   8.05 cm/s LVOT diam:     2.10 cm     LV E/e' lateral: 10.6 LV SV:         63 LV SV Index:   40          2D Longitudinal Strain LVOT Area:     3.46 cm    2D Strain GLS Avg:     -13.8 %  LV Volumes (MOD) LV vol d, MOD A2C: 98.3 ml LV vol d, MOD A4C: 69.1 ml  LV vol s, MOD A2C: 43.5 ml LV vol s, MOD A4C: 32.8 ml LV SV MOD A2C:     54.8 ml LV SV MOD A4C:     69.1 ml LV SV MOD BP:      45.3 ml RIGHT VENTRICLE             IVC RV S prime:     12.20 cm/s  IVC diam: 1.80 cm RVOT diam:      2.55 cm TAPSE (M-mode): 2.6 cm LEFT ATRIUM             Index        RIGHT ATRIUM           Index LA diam:        3.60 cm 2.27 cm/m   RA Area:     20.20 cm LA Vol (A2C):   80.5 ml 50.74 ml/m  RA Volume:   62.10 ml  39.14 ml/m LA Vol (A4C):   31.7 ml 19.98 ml/m LA Biplane Vol: 52.2 ml 32.90 ml/m  AORTIC VALVE                    PULMONIC VALVE AV Area (Vmax):    3.36 cm     PV Vmax:       0.72 m/s AV Area (Vmean):   2.87 cm     PV Peak grad:  2.1 mmHg AV Area (VTI):     3.06 cm AV Vmax:           101.00 cm/s AV Vmean:          74.900 cm/s AV VTI:            0.205 m AV Peak Grad:      4.1 mmHg AV Mean Grad:      2.0 mmHg LVOT Vmax:         97.90 cm/s LVOT Vmean:        62.100 cm/s LVOT VTI:          0.181 m LVOT/AV VTI ratio: 0.88 AR Vena Contracta: 0.20 cm  AORTA Ao Root diam: 3.30 cm Ao Asc diam:  3.20 cm Ao Arch diam: 2.5 cm MITRAL VALVE               TRICUSPID VALVE MV Area (PHT): 4.99 cm    TR Peak grad:   22.7 mmHg MV Decel Time: 152 msec    TR Vmax:        238.00 cm/s MV E velocity: 85.70 cm/s MV A velocity: 58.30 cm/s  SHUNTS MV E/A ratio:  1.47        Systemic VTI:  0.18 m                            Systemic Diam: 2.10 cm                            Pulmonic Diam: 2.55 cm Ida Rogue MD Electronically signed by Ida Rogue MD Signature Date/Time: 10/14/2021/4:45:02 PM    Final     CT CHEST ABDOMEN PELVIS W CONTRAST  Result Date: 09/20/2021 CLINICAL DATA:  Ovarian cancer restaging.  05/16/2021 EXAM: CT CHEST, ABDOMEN, AND PELVIS WITH CONTRAST TECHNIQUE: Multidetector CT imaging of the chest, abdomen and pelvis was performed following the standard protocol during bolus administration of intravenous contrast. RADIATION DOSE REDUCTION: This exam was performed according to the departmental  dose-optimization program which includes automated exposure control, adjustment of the mA and/or kV according to patient size and/or use of iterative reconstruction technique. CONTRAST:  156mL OMNIPAQUE IOHEXOL 300 MG/ML  SOLN COMPARISON:  05/16/2021 FINDINGS: CT CHEST FINDINGS Cardiovascular: Mild cardiac enlargement. No pericardial effusion. Mild aortic atherosclerosis. Mediastinum/Nodes: No enlarged supraclavicular, axillary, mediastinal, or hilar lymph nodes. Trachea, thyroid gland and esophagus demonstrate no significant findings. Lungs/Pleura: No pleural effusions. Stable 6 mm left lower lobe lung nodule, image 87/5. No new or enlarging lung nodules. Musculoskeletal: No chest wall mass or suspicious bone lesions identified. CT ABDOMEN PELVIS FINDINGS Hepatobiliary: No focal liver abnormality is seen. No gallstones, gallbladder wall thickening, or biliary dilatation. Pancreas: Unremarkable. No pancreatic ductal dilatation or surrounding inflammatory changes. Spleen: Normal in size without focal abnormality. Adrenals/Urinary Tract: Normal adrenal glands. No kidney mass or hydronephrosis. Unchanged bilateral too small to reliably characterize low-density kidney lesions compatible with Bosniak class 2 cysts. No follow-up recommended. No nephrolithiasis or hydronephrosis. Urinary bladder appears normal. Stomach/Bowel: Small hiatal hernia. No bowel wall thickening, inflammation, or distension. Vascular/Lymphatic: Aortic atherosclerosis.  No aneurysm. The previous index left periaortic retroperitoneal lymph  node measures 1.1 cm, image 65/2. Previously 1.3 cm. 1 cm aortocaval lymph node is unchanged, image 72/2. No enlarged pelvic or inguinal lymph nodes. Reproductive: Status post hysterectomy. No adnexal masses. Other: Status post omentectomy. Previously referenced low-density lesion within the right posterior pelvis measures 3.0 x 1.4 cm, image 106/2. Previously 3.3 x 1.4 cm. No significant ascites. Musculoskeletal: No acute or significant osseous findings. IMPRESSION: 1. No significant interval changed compared with the previous exam. 2. Stable appearance of soft tissue attenuating lesion in the posterolateral right hemipelvis. This may represent a treated site of peritoneal disease. 3. Unchanged appearance of borderline enlarged left periaortic and aortocaval retroperitoneal lymph nodes. 4. Unchanged 6 mm left lower lobe lung nodule. 5. No new signs of metastatic disease within the chest, abdomen or pelvis. 6. Aortic Atherosclerosis (ICD10-I70.0) and Emphysema (ICD10-J43.9). * Tracking Code: BO * Electronically Signed   By: Kerby Moors M.D.   On: 09/20/2021 08:18    ASSESSMENT & PLAN:   Malignant neoplasm of ovary ([facility]) CT showed stable disease. CA 125 remains stable at 15.7.  Repeat echocardiogram 10/14/21 showed LVEF 55-60%.  Labs reviewed and discussed with patient. Proceed with Doxil today. Continue to hold bevacizumab.   Proteinuria Continue hold bevacizumab.  Monitor monthly.     Hypertension Continue amlodipine 5 mg daily as well as losartan 25 mg daily. She reports normal blood pressures at home.    Anemia Secondary to chemotherapy.  Hemoglobin trended down slightly. Now 8.2. [place] for treatment today. Plan to return to clinic next week for count check, discussion of possible blood transfusion. Today, she is asymptomatic. Hesitant to consider transfusion.   Disposition: Next Thursday- lab (cbc, hold tube), see Dr Tasia Catchings. Friday - possible blood transfusion 4 weeks- lab, Dr Tasia Catchings, +/- doxil-  la  All questions were answered. The patient knows to call the clinic with any problems, questions or concerns.  Beckey Rutter, DNP, AGNP-C [place] [facility] [phone] (clinic) 10/21/2021

## 2021-10-21 NOTE — Patient Instructions (Signed)
[facility] CANCER CTR AT [place]  Discharge Instructions: Thank you for choosing [facility] to provide your oncology and hematology care.  If you have a lab appointment with the [facility], please go directly to the [facility] and check in at the registration area.  Wear comfortable clothing and clothing appropriate for easy access to any Portacath or PICC line.   We strive to give you quality time with your provider. You may need to reschedule your appointment if you arrive late (15 or more minutes).  Arriving late affects you and other patients whose appointments are after yours.  Also, if you miss three or more appointments without notifying the office, you may be dismissed from the clinic at the provider's discretion.      For prescription refill requests, have your pharmacy contact our office and allow 72 hours for refills to be completed.    Today you received the following chemotherapy and/or immunotherapy agents Doxil & Neulasta      To help prevent nausea and vomiting after your treatment, we encourage you to take your nausea medication as directed.  BELOW ARE SYMPTOMS THAT SHOULD BE REPORTED IMMEDIATELY: *FEVER GREATER THAN 100.4 F (38 C) OR HIGHER *CHILLS OR SWEATING *NAUSEA AND VOMITING THAT IS NOT CONTROLLED WITH YOUR NAUSEA MEDICATION *UNUSUAL SHORTNESS OF BREATH *UNUSUAL BRUISING OR BLEEDING *URINARY PROBLEMS (pain or burning when urinating, or frequent urination) *BOWEL PROBLEMS (unusual diarrhea, constipation, pain near the anus) TENDERNESS IN MOUTH AND THROAT WITH OR WITHOUT PRESENCE OF ULCERS (sore throat, sores in mouth, or a toothache) UNUSUAL RASH, SWELLING OR PAIN  UNUSUAL VAGINAL DISCHARGE OR ITCHING   Items with * indicate a potential emergency and should be followed up as soon as possible or go to the Emergency Department if any problems should occur.  Please show the CHEMOTHERAPY ALERT CARD or IMMUNOTHERAPY ALERT CARD at  check-in to the Emergency Department and triage nurse.  Should you have questions after your visit or need to cancel or reschedule your appointment, please contact [facility] [place] AT [place]  [phone] and follow the prompts.  Office hours are 8:00 a.m. to 4:30 p.m. Monday - Friday. Please note that voicemails left after 4:00 p.m. may not be returned until the following business day.  We are closed weekends and major holidays. You have access to a nurse at all times for urgent questions. Please call the main number to the clinic [phone] and follow the prompts.  For any non-urgent questions, you may also contact your provider using MyChart. We now offer e-Visits for anyone 3 and older to request care online for non-urgent symptoms. For details visit [email or web address].   Also download the MyChart app! Go to the app store, search "MyChart", open the app, select [facility], and log in with your MyChart username and password.  Masks are optional in the cancer centers. If you would like for your care team to wear a mask while they are taking care of you, please let them know. For doctor visits, patients may have with them one support person who is at least 70 years old. At this time, visitors are not allowed in the infusion area.

## 2021-10-24 ENCOUNTER — Encounter: Payer: Self-pay | Admitting: Oncology

## 2021-10-25 ENCOUNTER — Telehealth: Payer: Self-pay | Admitting: *Deleted

## 2021-10-25 MED ORDER — AMOXICILLIN-POT CLAVULANATE 875-125 MG PO TABS: 1.0000 | ORAL_TABLET | Freq: Two times a day (BID) | ORAL | 0 refills | Status: DC

## 2021-10-25 NOTE — Telephone Encounter (Signed)
Per conversation with Dr Janese Banks, the dental office, and myself, patient came in this morning with very infected teeth and needs extractions for it, She is swollen and painful from infection. Dr Janese Banks approves for patient to have extractions and stated patient needs Augmentin 875 mg twice a day times 7 days which I sent to [facility] on [address] per patient preference. Patient was sitting close to [facility] and is aware of need to get antibiotics.

## 2021-10-25 NOTE — Telephone Encounter (Signed)
Call from Dr Ronnald Ramp office at [facility] stating patient is in office now for dental extractions and they needs to know if it is OK to proceed with procedure or not. Her last chemotherapy treatment (Doxil) was 10/21/21. 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[place])    0 Result Notes           Component Ref Range & Units 4 d ago (10/21/21) 1 mo ago (09/23/21) 2 mo ago (08/26/21) 2 mo ago (07/29/21) 3 mo ago (07/15/21) 3 mo ago (07/01/21) 4 mo ago (06/03/21)  WBC 4.0 - 10.5 K/uL 4.3  4.9  4.1  4.4  5.4  3.9 Low   3.5 Low    RBC 3.87 - 5.11 MIL/uL 2.35 Low   2.57 Low   2.76 Low   2.71 Low   2.57 Low   2.90 Low   2.62 Low    Hemoglobin 12.0 - 15.0 g/dL 8.2 Low   8.9 Low   9.4 Low   9.3 Low   9.1 Low   10.2 Low   9.2 Low    HCT 36.0 - 46.0 % 24.8 Low   26.8 Low   29.0 Low   28.4 Low   27.4 Low   30.6 Low   27.8 Low    MCV 80.0 - 100.0 fL 105.5 High   104.3 High   105.1 High   104.8 High   106.6 High   105.5 High   106.1 High    MCH 26.0 - 34.0 pg 34.9 High   34.6 High   34.1 High   34.3 High   35.4 High   35.2 High   35.1 High    MCHC 30.0 - 36.0 g/dL 33.1  33.2  32.4  32.7  33.2  33.3  33.1   RDW 11.5 - 15.5 % 15.7 High   15.6 High   15.7 High   14.6  14.7  14.6  15.7 High    Platelets 150 - 400 K/uL 111 Low   100 Low   133 Low   140 Low   103 Low   159 CM  111 Low    nRBC 0.0 - 0.2 % 0.0  0.0  0.0  0.0  0.0  0.0  0.0   Neutrophils Relative % % 64  69  65  58  65  56  60   Neutro Abs 1.7 - 7.7 K/uL 2.8  3.4  2.7  2.5  3.5  2.2  2.1   Lymphocytes Relative % '20  16  19  24  21  26  21   '$ Lymphs Abs 0.7 - 4.0 K/uL 0.9  0.8  0.8  1.1  1.1  1.0  0.7   Monocytes Relative % '13  12  12  14  8  15  15   '$ Monocytes Absolute 0.1 - 1.0 K/uL 0.5  0.6  0.5  0.6  0.4  0.6  0.5   Eosinophils Relative % '2  2  3  2   3  2  3   '$ Eosinophils Absolute 0.0 - 0.5 K/uL 0.1  0.1  0.1  0.1  0.1  0.1  0.1   Basophils Relative % '1  1  1  1  1  '$ 1  1   Basophils Absolute 0.0 - 0.1 K/uL 0.0  0.0  0.1  0.1  0.1  0.0  0.0   Immature Granulocytes % 0  0  0  1  2  0  0   Abs Immature Granulocytes 0.00 - 0.07 K/uL 0.01  0.02 CM  0.01 CM  0.02 CM  0.09 High  CM  0.01 CM  0.01 CM   Comment: Performed at [facility], [address] [place], [place] [address]  Resulting Agency  [facility] LAB [place] [facility] [place] [facility] [place] [facility] [place] [facility] [place] [facility] [place] [facility]         Specimen Collected: 10/21/21 12:39 Last Resulted: 10/21/21 12:55      Lab Flowsheet    Order Details    View Encounter    Lab and Collection Details    Routing    Result History    View All Conversations on this Encounter      CM=Additional comments      Result Care Coordination   Patient Communication   Add Comments   Not seen Back to Top       Other Results from 10/21/2021   Contains abnormal data Comprehensive metabolic panel Order: [id number] Status: Final result    Visible to patient: No (inaccessible in MyChart)    Next appt: 10/27/2021 at 10:15 AM in Oncology ([facility])    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[place])    0 Result Notes           Component Ref Range & Units 4 d ago (10/21/21) 1 mo ago (09/23/21) 2 mo ago (08/26/21) 2 mo ago (07/29/21) 3 mo ago (07/15/21) 3 mo ago (07/01/21) 4 mo ago (06/03/21)  Sodium 135 - 145 mmol/L 133 Low   132 Low   133 Low   131 Low   130 Low   130 Low   132 Low    Potassium 3.5 - 5.1 mmol/L 3.7  4.0  3.8  3.8  4.0  4.0  3.7   Chloride 98 - 111 mmol/L 105  104  104  103  102  101  102   CO2 22 - 32 mmol/L '22  22  23  24  23  23  23   '$ Glucose, Bld 70 - 99 mg/dL 90  88 CM  102 High  CM  90 CM  93 CM  91 CM  110 High  CM   Comment: Glucose reference range applies only to samples taken after fasting for at least 8 hours.  BUN 8 - 23 mg/dL '22  16  13  17  16  12  17   '$ Creatinine, Ser 0.44 -  1.00 mg/dL 0.99  0.80  0.64  0.64  0.62  0.69  0.72   Calcium 8.9 - 10.3 mg/dL 9.2  9.1  9.3  9.4  9.4  9.5  9.5   Total Protein 6.5 - 8.1 g/dL 6.8  6.8  6.4 Low   6.9  6.8  7.0  6.6   Albumin 3.5 - 5.0 g/dL 3.8  3.7  3.7  3.9  3.9  4.1  3.6   AST 15 - 41 U/L '18  18  19  15  19  16  19   '$ ALT 0 - 44 U/L '9  9  9  9  13  8  10   '$ Alkaline Phosphatase 38 - 126 U/L 56  60  51  58  70  52  51   Total Bilirubin 0.3 - 1.2 mg/dL 1.0  0.7  0.7  0.7  0.6  0.5  0.3   GFR, Estimated >60 mL/min >60  >60 CM  >60 CM  >60 CM  >60 CM  >60 CM  >60 CM   Comment: (NOTE)  Calculated using the CKD-EPI Creatinine Equation (2021)   Anion gap 5 - '15 6  6 '$ CM  6 CM  4 Low  CM  5 CM  6 CM  7 CM   Comment: Performed at [facility], [address] [place], [place] [address]  Resulting Agency  [facility] CL[facility] LAB [place] [facility] [place] [facility] [place] [facility] [place] [facility] [place] [facility] [place] [facility]         Specimen Collected: 10/21/21 12:39 Last Resulted: 10/21/21 13:07

## 2021-10-27 ENCOUNTER — Encounter: Payer: Self-pay | Admitting: Medical Oncology

## 2021-10-27 ENCOUNTER — Inpatient Hospital Stay: Payer: Medicare Other

## 2021-10-27 ENCOUNTER — Inpatient Hospital Stay (HOSPITAL_BASED_OUTPATIENT_CLINIC_OR_DEPARTMENT_OTHER): Payer: Medicare Other | Admitting: Medical Oncology

## 2021-10-27 VITALS — BP 160/79 | HR 85 | Temp 98.7°F | Resp 20 | Wt 110.5 lb

## 2021-10-27 DIAGNOSIS — Z5111 Encounter for antineoplastic chemotherapy: Secondary | ICD-10-CM

## 2021-10-27 DIAGNOSIS — C569 Malignant neoplasm of unspecified ovary: Secondary | ICD-10-CM | POA: Diagnosis not present

## 2021-10-27 DIAGNOSIS — D6481 Anemia due to antineoplastic chemotherapy: Secondary | ICD-10-CM | POA: Diagnosis not present

## 2021-10-27 DIAGNOSIS — D649 Anemia, unspecified: Secondary | ICD-10-CM

## 2021-10-27 DIAGNOSIS — C7800 Secondary malignant neoplasm of unspecified lung: Secondary | ICD-10-CM | POA: Diagnosis not present

## 2021-10-27 DIAGNOSIS — R809 Proteinuria, unspecified: Secondary | ICD-10-CM | POA: Diagnosis not present

## 2021-10-27 DIAGNOSIS — I119 Hypertensive heart disease without heart failure: Secondary | ICD-10-CM | POA: Diagnosis not present

## 2021-10-27 LAB — CBC WITH DIFFERENTIAL/PLATELET
Abs Immature Granulocytes: 0.4 10*3/uL — ABNORMAL HIGH (ref 0.00–0.07)
Basophils Absolute: 0 10*3/uL (ref 0.0–0.1)
Basophils Relative: 0 %
Eosinophils Absolute: 0.1 10*3/uL (ref 0.0–0.5)
Eosinophils Relative: 1 %
HCT: 24.9 % — ABNORMAL LOW (ref 36.0–46.0)
Hemoglobin: 8 g/dL — ABNORMAL LOW (ref 12.0–15.0)
Immature Granulocytes: 3 %
Lymphocytes Relative: 5 %
Lymphs Abs: 0.7 10*3/uL (ref 0.7–4.0)
MCH: 34.6 pg — ABNORMAL HIGH (ref 26.0–34.0)
MCHC: 32.1 g/dL (ref 30.0–36.0)
MCV: 107.8 fL — ABNORMAL HIGH (ref 80.0–100.0)
Monocytes Absolute: 0.8 10*3/uL (ref 0.1–1.0)
Monocytes Relative: 5 %
Neutro Abs: 12.3 10*3/uL — ABNORMAL HIGH (ref 1.7–7.7)
Neutrophils Relative %: 86 %
Platelets: 111 10*3/uL — ABNORMAL LOW (ref 150–400)
RBC: 2.31 MIL/uL — ABNORMAL LOW (ref 3.87–5.11)
RDW: 15.1 % (ref 11.5–15.5)
Smear Review: NORMAL
WBC: 14.2 10*3/uL — ABNORMAL HIGH (ref 4.0–10.5)
nRBC: 0 % (ref 0.0–0.2)

## 2021-10-27 LAB — SAMPLE TO BLOOD BANK

## 2021-10-27 LAB — PREPARE RBC (CROSSMATCH)

## 2021-10-27 MED ORDER — HEPARIN SOD (PORK) LOCK FLUSH 100 UNIT/ML IV SOLN
500.0000 [IU] | Freq: Once | INTRAVENOUS | Status: AC
  Administered 2021-10-27: 500 [IU] via INTRAVENOUS
  Filled 2021-10-27: qty 5

## 2021-10-27 MED ORDER — SODIUM CHLORIDE 0.9% FLUSH
10.0000 mL | INTRAVENOUS | Status: DC | PRN
  Administered 2021-10-27: 10 mL via INTRAVENOUS
  Filled 2021-10-27: qty 10

## 2021-10-28 ENCOUNTER — Inpatient Hospital Stay: Payer: Medicare Other

## 2021-10-28 ENCOUNTER — Encounter: Payer: Self-pay | Admitting: Oncology

## 2021-10-28 DIAGNOSIS — C569 Malignant neoplasm of unspecified ovary: Secondary | ICD-10-CM | POA: Diagnosis not present

## 2021-10-28 DIAGNOSIS — R809 Proteinuria, unspecified: Secondary | ICD-10-CM | POA: Diagnosis not present

## 2021-10-28 DIAGNOSIS — Z5111 Encounter for antineoplastic chemotherapy: Secondary | ICD-10-CM | POA: Diagnosis not present

## 2021-10-28 DIAGNOSIS — I119 Hypertensive heart disease without heart failure: Secondary | ICD-10-CM | POA: Diagnosis not present

## 2021-10-28 DIAGNOSIS — D6481 Anemia due to antineoplastic chemotherapy: Secondary | ICD-10-CM | POA: Diagnosis not present

## 2021-10-28 DIAGNOSIS — D649 Anemia, unspecified: Secondary | ICD-10-CM

## 2021-10-28 DIAGNOSIS — C7800 Secondary malignant neoplasm of unspecified lung: Secondary | ICD-10-CM | POA: Diagnosis not present

## 2021-10-28 MED ORDER — HEPARIN SOD (PORK) LOCK FLUSH 100 UNIT/ML IV SOLN
500.0000 [IU] | Freq: Every day | INTRAVENOUS | Status: AC | PRN
  Administered 2021-10-28: 500 [IU]
  Filled 2021-10-28: qty 5

## 2021-10-28 MED ORDER — SODIUM CHLORIDE 0.9% FLUSH
10.0000 mL | INTRAVENOUS | Status: AC | PRN
  Administered 2021-10-28: 10 mL
  Filled 2021-10-28: qty 10

## 2021-10-28 MED ORDER — SODIUM CHLORIDE 0.9% IV SOLUTION
250.0000 mL | Freq: Once | INTRAVENOUS | Status: AC
  Administered 2021-10-28: 250 mL via INTRAVENOUS
  Filled 2021-10-28: qty 250

## 2021-10-28 MED ORDER — DIPHENHYDRAMINE HCL 25 MG PO CAPS
25.0000 mg | ORAL_CAPSULE | Freq: Once | ORAL | Status: AC
  Administered 2021-10-28: 25 mg via ORAL
  Filled 2021-10-28: qty 1

## 2021-10-28 MED ORDER — METHYLPREDNISOLONE SODIUM SUCC 125 MG IJ SOLR
40.0000 mg | Freq: Once | INTRAMUSCULAR | Status: AC
  Administered 2021-10-28: 40 mg via INTRAVENOUS
  Filled 2021-10-28: qty 2

## 2021-10-28 MED ORDER — ACETAMINOPHEN 325 MG PO TABS
650.0000 mg | ORAL_TABLET | Freq: Once | ORAL | Status: AC
  Administered 2021-10-28: 650 mg via ORAL
  Filled 2021-10-28: qty 2

## 2021-10-28 NOTE — Progress Notes (Signed)
[place] [facility]  Hematology/Oncology Progress Note [phone] [phone]  Patient Care Team: Cletis Athens, MD as PCP - General (Internal Medicine) Clent Jacks, [name], [name], [name], MD as Referring Physician (Obstetrics and Gynecology)  CHIEF COMPLAINTS/PURPOSE OF Visit Follow up for chemotherapy for ovarian cancer.  HISTORY OF PRESENTING ILLNESS: Katie Hill 71 y.o. female presents for follow up of management of recurrent  ovarian cancer. Oncology History  Malignant neoplasm of ovary ([place])  09/20/2011 Imaging   CT chest abdomen pelvis w contrast 1. No significant interval changed compared with the previous exam.2. Stable appearance of soft tissue attenuating lesion in the posterolateral right hemipelvis. This may represent a treated site of peritoneal disease.3. Unchanged appearance of borderline enlarged left periaortic and aortocaval retroperitoneal lymph nodes.4. Unchanged 6 mm left lower lobe lung nodule. 5. No new signs of metastatic disease within the chest, abdomen or pelvis. 6. Aortic Atherosclerosis (ICD10-I70.0) and Emphysema    11/20/2016 Initial Diagnosis   Malignant neoplasm of ovary   Pathology 11/16/2016 Surgical Pathology  CASE: [id number]  PATIENT: Katie Hill  Surgical Pathology Report   SPECIMEN SUBMITTED:  A. Retroperitoneal adenopathy, left  DIAGNOSIS:  A. LYMPH NODE, LEFT RETROPERITONEAL; CT-GUIDED CORE BIOPSY:  - METASTATIC HIGH-GRADE SEROUS CARCINOMA.  Pathology 03/14/2017   DIAGNOSIS:  A. OMENTUM; OMENTECTOMY:  - NO TUMOR SEEN.  - ONE NEGATIVE LYMPH NODE (0/1).   B.  RIGHT FALLOPIAN TUBE AND OVARY; SALPINGO-OOPHORECTOMY:  - SMALL FOCI OF HIGH GRADE SEROUS CARCINOMA INVOLVING THE OVARY.  - MARKED THERAPY RELATED CHANGE.  - NO  TUMOR SEEN IN THE FALLOPIAN TUBE.   C.  UTERUS, CERVIX, LEFT FALLOPIAN TUBE AND OVARY; HYSTERECTOMY AND LEFT  SALPINGO-OOPHORECTOMY:  - NABOTHIAN CYSTS.  - CYSTIC ATROPHY OF THE ENDOMETRIUM.  - SEROSAL ADHESIONS.  - UNREMARKABLE FALLOPIAN TUBE.  - OVARY SHOWING TREATMENT RELATED CHANGE.   D.  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    12/01/2016 -  Chemotherapy   Carboplatin and taxol x 4 neoadjuvant chemotherapy    03/14/2017 Surgery   Patient had debulking surgery on 03/14/2017. She had a laparoscopy with conversion to laparotomy, total hysterectomy, with bilateral salpingo oophorectomy, right aortic lymph node dissection, omentectomy. Pathology showed small foci of residual disease in ovary.   HRD positive, declined Olarparib maintenance trial    03/28/2017 -  Chemotherapy   Adjuvant carbo and taxol x3 cycles   02/28/2018 Progression   Local Recurrence. CA125 146 CT abdomen pelvis w contrast showed interval enlargement of an aortocaval lymph node which currently measures 1.4 cm in short axis (axial image 75 of series 2). This lymph node is in close proximity to the previously resected retroperitoneal lymphadenopathy and is very concerning for an additional nodal metastasis.    03/15/2018 -  Chemotherapy   03/15/2018-05/17/2018 Carboplatin and Taxol x 4.  CA125 decrease from 49.7 to 6 after 4 cycles of treatment.  05/17/2018-10/29/20   Olarparib 383m BID    11/05/2018 Imaging   MRI abdomenPelvis on 11/05/2018 showed Interval progression of, now measuring 2.7 x 2.3 cm and concerning for progression of metastatic disease.retrocaval lymph node in the abdomen Olaparib was held for short period of time. Her CA125 trended down again.  Dr. BFransisca Connorsrecommending resume olaparib and continue monitor.   10/06/2020 Progression    CT  chest abdomen pelvis with contrast showed new lymph nodes in the prevascular space of  the upper anterior mediastinum most consistent with metastasis.  Single new right lower lobe pulmonary nodule is concerning for early pulmonary metastasis.  Interval enlargement of the left periaortic retroperitoneal lymph node.  Stable adjacent aorto caval adenopathy.   11/03/2020 -  Chemotherapy   11/03/20 -05/20/21 Doxil + Bevacizumab q28d  06/03/21 Doxil only due to proteinuria.  07/01/21-07/15/21  Resumed on Doxil + Bevacizumab 07/29/21 Doxil only due to proteinuria.     Genetic Testing   Genetic testing negative for 83 genes on [facility]'s Multi-Cancer panel (ALK, APC, ATM, AXIN2, BAP1, BARD1, BLM, BMPR1A, BRCA1, BRCA2, BRIP1, CASR, CDC73, CDH1, CDK4, CDKN1B, CDKN1C, CDKN2A, CEBPA, CHEK2, CTNNA1, DICER1, DIS3L2, EGFR, EPCAM, FH, FLCN, GATA2, GPC3, GREM1, HOXB13, HRAS, KIT, MAX, MEN1, MET, MITF, MLH1, MSH2, MSH3, MSH6, MUTYH, NBN, NF1, NF2, NTHL1, PALB2, PDGFRA, PHOX2B, PMS2, POLD1, POLE, POT1, PRKAR1A, PTCH1, PTEN, RAD50, RAD51C, RAD51D, RB1, RECQL4, RET, RUNX1, SDHA, SDHAF2, SDHB, SDHC, SDHD, SMAD4, SMARCA4, SMARCB1, SMARCE1, STK11, SUFU, TERC, TERT, TMEM127, TP53, TSC1, TSC2, VHL, WRN, WT1).   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  Myraid testing negative for somatic BRACA1/2, positive for HRD.    10/14/2021 Echocardiogram    Left ventricular ejection fraction, by estimation, is 55 to 60%   High grade ovarian cancer ([place])  12/04/2019 Initial Diagnosis   High grade ovarian cancer ([place])   11/03/2020 -  Chemotherapy   11/03/20 -05/20/21 Doxil + Bevacizumab q28d  06/03/21 Doxil only due to proteinuria.  07/01/21-07/15/21  Resumed on Doxil + Bevacizumab 07/29/21 Doxil only due to proteinuria.      INTERVAL HISTORY: Patient presents today for consideration of 1 unit PRBC for symptomatic anemia secondary to chemotherapy. She reports that she has been feeling fatigue but notes no SOB or chest pain. She recently has 2 molar pulled at the dentist for abscess. She had  bleeding during the procedure and continues to have mild weeping from the area- though improving. No fever, trouble swallowing.   Review of Systems  Constitutional:  Positive for fatigue. Negative for appetite change, chills and fever.  HENT:   Negative for hearing loss and voice change.   Eyes:  Negative for eye problems.  Respiratory:  Negative for chest tightness and cough.   Cardiovascular:  Negative for chest pain and leg swelling.  Gastrointestinal:  Negative for abdominal distention, abdominal pain and blood in stool.  Endocrine: Negative for hot flashes.  Genitourinary:  Negative for difficulty urinating and frequency.   Musculoskeletal:  Negative for arthralgias.  Skin:  Negative for itching and rash.  Neurological:  Negative for extremity weakness.  Hematological:  Negative for adenopathy.  Psychiatric/Behavioral:  Negative for confusion.    MEDICAL HISTORY: Past Medical History:  Diagnosis Date   Dysrhythmia    Genetic testing 03/28/2017   Multi-Cancer panel (83 genes) @ [facility] - No pathogenic mutations detected   High grade ovarian cancer ([place]) 11/20/2016   Pelvic mass in female     SURGICAL HISTORY: Past Surgical History:  Procedure Laterality Date   APPENDECTOMY     LAPAROSCOPY N/A 03/14/2017   Procedure: LAPAROSCOPY OPERATIVE;  Surgeon: Mellody Drown, MD;  Location: [facility];  Service: Gynecology;  Laterality: N/A;   LAPAROTOMY N/A 03/14/2017   Procedure: LAPAROTOMY;  Surgeon: Mellody Drown, MD;  Location: [facility];  Service: Gynecology;  Laterality: N/A;   LYMPH NODE DISSECTION N/A 03/14/2017   Procedure: LYMPH NODE DISSECTION;  Surgeon:  Mellody Drown, MD;  Location: [facility];  Service: Gynecology;  Laterality: N/A;   OMENTECTOMY N/A 03/14/2017   Procedure: OMENTECTOMY;  Surgeon: Mellody Drown, MD;  Location: [facility];  Service: Gynecology;  Laterality: N/A;   PORTA CATH INSERTION N/A 11/27/2016   Procedure: Glori Luis Cath Insertion;  Surgeon: Algernon Huxley,  MD;  Location: [facility];  Service: Cardiovascular;  Laterality: N/A;    SOCIAL HISTORY: Social History   Tobacco Use   Smoking status: Never   Smokeless tobacco: Never  Vaping Use   Vaping Use: Never used  Substance Use Topics   Alcohol use: Not Currently   Drug use: No     FAMILY HISTORY Family History  Problem Relation Age of Onset   Throat cancer Cousin    Throat cancer Cousin    Leukemia Cousin     ALLERGIES:  is allergic to omeprazole.  MEDICATIONS:  Current Outpatient Medications  Medication Sig Dispense Refill   amLODipine (NORVASC) 5 MG tablet Take 1 tablet (5 mg total) by mouth daily. 30 tablet 1   amoxicillin-clavulanate (AUGMENTIN) 875-125 MG tablet Take 1 tablet by mouth 2 (two) times daily. 14 tablet 0   levothyroxine (SYNTHROID) 100 MCG tablet Take 1 tablet (100 mcg total) by mouth daily. 90 tablet 3   lidocaine-prilocaine (EMLA) cream Apply 1 application topically as needed. Apply small amount to port site at least 1 hour prior to it being accessed, cover with plastic wrap 30 g 1   losartan (COZAAR) 25 MG tablet Take 1 tablet (25 mg total) by mouth daily. 90 tablet 0   ondansetron (ZOFRAN) 8 MG tablet Take 1 tablet (8 mg total) by mouth every 8 (eight) hours as needed for nausea or vomiting. 60 tablet 1   No current facility-administered medications for this visit.    PHYSICAL EXAMINATION:  ECOG PERFORMANCE STATUS: 0 - Asymptomatic Vitals:   10/27/21 1042  BP: (!) 160/79  Pulse: 85  Resp: 20  Temp: 98.7 F (37.1 C)  SpO2: 100%    Filed Weights   10/27/21 1042  Weight: 110 lb 8 oz (50.1 kg)     Physical Exam Constitutional:      General: She is not in acute distress.    Appearance: She is not diaphoretic.     Comments: Thin built, she walks independantly  HENT:     Head: Normocephalic and atraumatic.     Mouth/Throat:     Pharynx: No oropharyngeal exudate.  Eyes:     General: No scleral icterus.    Comments: Pallor of  conjunctiva    Neck:     Vascular: No JVD.  Cardiovascular:     Rate and Rhythm: Normal rate and regular rhythm.     Heart sounds: No murmur heard. Pulmonary:     Effort: Pulmonary effort is normal. No respiratory distress.     Breath sounds: Normal breath sounds. No rales.  Chest:     Chest wall: No tenderness.  Abdominal:     General: Bowel sounds are normal. There is no distension.     Palpations: Abdomen is soft.     Tenderness: There is no abdominal tenderness.  Musculoskeletal:        General: Normal range of motion.     Cervical back: Normal range of motion and neck supple.  Lymphadenopathy:     Cervical: No cervical adenopathy.  Skin:    General: Skin is warm and dry.     Coloration: Skin is pale.  Findings: No erythema or rash.     Comments: Right anterior medi port +   Neurological:     Mental Status: She is alert and oriented to person, place, and time.     Cranial Nerves: No cranial nerve deficit.     Motor: No abnormal muscle tone.     Coordination: Coordination normal.  Psychiatric:        Mood and Affect: Affect normal.        Behavior: Behavior normal.        Judgment: Judgment normal.      LABORATORY DATA: I have personally reviewed the data as listed: CBC    Component Value Date/Time   WBC 14.2 (H) 10/27/2021 1012   RBC 2.31 (L) 10/27/2021 1012   HGB 8.0 (L) 10/27/2021 1012   HCT 24.9 (L) 10/27/2021 1012   PLT 111 (L) 10/27/2021 1012   MCV 107.8 (H) 10/27/2021 1012   MCH 34.6 (H) 10/27/2021 1012   MCHC 32.1 10/27/2021 1012   RDW 15.1 10/27/2021 1012   LYMPHSABS 0.7 10/27/2021 1012   MONOABS 0.8 10/27/2021 1012   EOSABS 0.1 10/27/2021 1012   BASOSABS 0.0 10/27/2021 1012      Latest Ref Rng & Units 10/21/2021   12:39 PM 09/23/2021    8:17 AM 08/26/2021    8:09 AM  CMP  Glucose 70 - 99 mg/dL 90  88  102   BUN 8 - 23 mg/dL _0 Creatinine 0.44 - 1.00 mg/dL 0.99  0.80  0.64   Sodium 135 - 145 mmol/L 133  132  133   Potassium 3.5  - 5.1 mmol/L 3.7  4.0  3.8   Chloride 98 - 111 mmol/L 105  104  104   CO2 22 - 32 mmol/L _1 Calcium 8.9 - 10.3 mg/dL 9.2  9.1  9.3   Total Protein 6.5 - 8.1 g/dL 6.8  6.8  6.4   Total Bilirubin 0.3 - 1.2 mg/dL 1.0  0.7  0.7   Alkaline Phos 38 - 126 U/L 56  60  51   AST 15 - 41 U/L _2 ALT 0 - 44 U/L _3 RADIOGRAPHIC STUDIES: I have personally reviewed the radiological images as listed and agreed with the findings in the report. ECHOCARDIOGRAM COMPLETE  Result Date: 10/14/2021    ECHOCARDIOGRAM REPORT   Patient Name:   Katie Hill Date of Exam: 10/14/2021 Medical Rec #:  [id number]        Height:       67.0 in Accession #:    [id number]       Weight:       113.0 lb Date of Birth:  Jun 08, 1950        BSA:          1.587 m Patient Age:    71 years         BP:           158/92 mmHg Patient Gender: F                HR:           76 bpm. Exam Location:  [place] Procedure: 2D Echo, Cardiac Doppler and Color Doppler Indications:    Z09, Cardiotoxic drug therapy  History:        Patient has prior history of Echocardiogram examinations, most  recent 01/14/2021. Risk Factors:Hypertension and Non-Smoker.                 Currently receiving chemotherapy.  Sonographer:    Pilar Jarvis RDMS, RVT, RDCS Referring Phys: [id number] [facility]  Sonographer Comments: Global longitudinal strain was attempted. Very limited acoustic windows IMPRESSIONS  1. Left ventricular ejection fraction, by estimation, is 55 to 60%. The left ventricle has normal function. The left ventricle has no regional wall motion abnormalities. Left ventricular diastolic parameters were normal. The average left ventricular global longitudinal strain is -13.8 %.  2. Right ventricular systolic function is normal. The right ventricular size is normal. There is normal pulmonary artery systolic pressure. The estimated right ventricular systolic pressure is 41.6 mmHg.  3. The mitral valve is normal in structure. Mild  mitral valve regurgitation. No evidence of mitral stenosis.  4. The aortic valve is normal in structure. Aortic valve regurgitation is mild. No aortic stenosis is present.  5. The inferior vena cava is normal in size with greater than 50% respiratory variability, suggesting right atrial pressure of 3 mmHg. FINDINGS  Left Ventricle: Left ventricular ejection fraction, by estimation, is 55 to 60%. The left ventricle has normal function. The left ventricle has no regional wall motion abnormalities. The average left ventricular global longitudinal strain is -13.8 %. The left ventricular internal cavity size was normal in size. There is no left ventricular hypertrophy. Left ventricular diastolic parameters were normal. Right Ventricle: The right ventricular size is normal. No increase in right ventricular wall thickness. Right ventricular systolic function is normal. There is normal pulmonary artery systolic pressure. The tricuspid regurgitant velocity is 2.38 m/s, and  with an assumed right atrial pressure of 5 mmHg, the estimated right ventricular systolic pressure is 38.4 mmHg. Left Atrium: Left atrial size was normal in size. Right Atrium: Right atrial size was normal in size. Pericardium: There is no evidence of pericardial effusion. Mitral Valve: The mitral valve is normal in structure. Mild mitral valve regurgitation. No evidence of mitral valve stenosis. Tricuspid Valve: The tricuspid valve is normal in structure. Tricuspid valve regurgitation is mild . No evidence of tricuspid stenosis. Aortic Valve: The aortic valve is normal in structure. Aortic valve regurgitation is mild. No aortic stenosis is present. Aortic valve mean gradient measures 2.0 mmHg. Aortic valve peak gradient measures 4.1 mmHg. Aortic valve area, by VTI measures 3.06 cm. Pulmonic Valve: The pulmonic valve was normal in structure. Pulmonic valve regurgitation is not visualized. No evidence of pulmonic stenosis. Aorta: The aortic root is normal  in size and structure. Venous: The inferior vena cava is normal in size with greater than 50% respiratory variability, suggesting right atrial pressure of 3 mmHg. IAS/Shunts: No atrial level shunt detected by color flow Doppler.  LEFT VENTRICLE PLAX 2D LVIDd:         5.00 cm     Diastology LVIDs:         3.60 cm     LV e' medial:    7.83 cm/s LV PW:         1.10 cm     LV E/e' medial:  10.9 LV IVS:        1.00 cm     LV e' lateral:   8.05 cm/s LVOT diam:     2.10 cm     LV E/e' lateral: 10.6 LV SV:         63 LV SV Index:   40  2D Longitudinal Strain LVOT Area:     3.46 cm    2D Strain GLS Avg:     -13.8 %  LV Volumes (MOD) LV vol d, MOD A2C: 98.3 ml LV vol d, MOD A4C: 69.1 ml LV vol s, MOD A2C: 43.5 ml LV vol s, MOD A4C: 32.8 ml LV SV MOD A2C:     54.8 ml LV SV MOD A4C:     69.1 ml LV SV MOD BP:      45.3 ml RIGHT VENTRICLE             IVC RV S prime:     12.20 cm/s  IVC diam: 1.80 cm RVOT diam:      2.55 cm TAPSE (M-mode): 2.6 cm LEFT ATRIUM             Index        RIGHT ATRIUM           Index LA diam:        3.60 cm 2.27 cm/m   RA Area:     20.20 cm LA Vol (A2C):   80.5 ml 50.74 ml/m  RA Volume:   62.10 ml  39.14 ml/m LA Vol (A4C):   31.7 ml 19.98 ml/m LA Biplane Vol: 52.2 ml 32.90 ml/m  AORTIC VALVE                    PULMONIC VALVE AV Area (Vmax):    3.36 cm     PV Vmax:       0.72 m/s AV Area (Vmean):   2.87 cm     PV Peak grad:  2.1 mmHg AV Area (VTI):     3.06 cm AV Vmax:           101.00 cm/s AV Vmean:          74.900 cm/s AV VTI:            0.205 m AV Peak Grad:      4.1 mmHg AV Mean Grad:      2.0 mmHg LVOT Vmax:         97.90 cm/s LVOT Vmean:        62.100 cm/s LVOT VTI:          0.181 m LVOT/AV VTI ratio: 0.88 AR Vena Contracta: 0.20 cm  AORTA Ao Root diam: 3.30 cm Ao Asc diam:  3.20 cm Ao Arch diam: 2.5 cm MITRAL VALVE               TRICUSPID VALVE MV Area (PHT): 4.99 cm    TR Peak grad:   22.7 mmHg MV Decel Time: 152 msec    TR Vmax:        238.00 cm/s MV E velocity: 85.70 cm/s MV  A velocity: 58.30 cm/s  SHUNTS MV E/A ratio:  1.47        Systemic VTI:  0.18 m                            Systemic Diam: 2.10 cm                            Pulmonic Diam: 2.55 cm Ida Rogue MD Electronically signed by Ida Rogue MD Signature Date/Time: 10/14/2021/4:45:02 PM    Final    CT CHEST ABDOMEN PELVIS W CONTRAST  Result Date: 09/20/2021 CLINICAL DATA:  Ovarian cancer restaging.  05/16/2021 EXAM: CT CHEST, ABDOMEN, AND PELVIS WITH CONTRAST TECHNIQUE: Multidetector CT imaging of the chest, abdomen and pelvis was performed following the standard protocol during bolus administration of intravenous contrast. RADIATION DOSE REDUCTION: This exam was performed according to the departmental dose-optimization program which includes automated exposure control, adjustment of the mA and/or kV according to patient size and/or use of iterative reconstruction technique. CONTRAST:  14m OMNIPAQUE IOHEXOL 300 MG/ML  SOLN COMPARISON:  05/16/2021 FINDINGS: CT CHEST FINDINGS Cardiovascular: Mild cardiac enlargement. No pericardial effusion. Mild aortic atherosclerosis. Mediastinum/Nodes: No enlarged supraclavicular, axillary, mediastinal, or hilar lymph nodes. Trachea, thyroid gland and esophagus demonstrate no significant findings. Lungs/Pleura: No pleural effusions. Stable 6 mm left lower lobe lung nodule, image 87/5. No new or enlarging lung nodules. Musculoskeletal: No chest wall mass or suspicious bone lesions identified. CT ABDOMEN PELVIS FINDINGS Hepatobiliary: No focal liver abnormality is seen. No gallstones, gallbladder wall thickening, or biliary dilatation. Pancreas: Unremarkable. No pancreatic ductal dilatation or surrounding inflammatory changes. Spleen: Normal in size without focal abnormality. Adrenals/Urinary Tract: Normal adrenal glands. No kidney mass or hydronephrosis. Unchanged bilateral too small to reliably characterize low-density kidney lesions compatible with Bosniak class 2 cysts. No  follow-up recommended. No nephrolithiasis or hydronephrosis. Urinary bladder appears normal. Stomach/Bowel: Small hiatal hernia. No bowel wall thickening, inflammation, or distension. Vascular/Lymphatic: Aortic atherosclerosis.  No aneurysm. The previous index left periaortic retroperitoneal lymph node measures 1.1 cm, image 65/2. Previously 1.3 cm. 1 cm aortocaval lymph node is unchanged, image 72/2. No enlarged pelvic or inguinal lymph nodes. Reproductive: Status post hysterectomy. No adnexal masses. Other: Status post omentectomy. Previously referenced low-density lesion within the right posterior pelvis measures 3.0 x 1.4 cm, image 106/2. Previously 3.3 x 1.4 cm. No significant ascites. Musculoskeletal: No acute or significant osseous findings. IMPRESSION: 1. No significant interval changed compared with the previous exam. 2. Stable appearance of soft tissue attenuating lesion in the posterolateral right hemipelvis. This may represent a treated site of peritoneal disease. 3. Unchanged appearance of borderline enlarged left periaortic and aortocaval retroperitoneal lymph nodes. 4. Unchanged 6 mm left lower lobe lung nodule. 5. No new signs of metastatic disease within the chest, abdomen or pelvis. 6. Aortic Atherosclerosis (ICD10-I70.0) and Emphysema (ICD10-J43.9). * Tracking Code: BO * Electronically Signed   By: TKerby MoorsM.D.   On: 09/20/2021 08:18    ASSESSMENT & PLAN:    Anemia Acute on chronic. Secondary to chemotherapy and recent/current blood loss. Hemoglobin has fallen to 8 from 8.2 prior to last treatment. Likely lowering further over the next few days. She is symptomatic with active (scant) bleeding. Pt is open to transfusion. We has a lengthy discussion of risks/benefits and how this procedure was performed.   Disposition: Return tomorrow for 1 unit PRBC  All questions were answered. The patient knows to call the clinic with any problems, questions or concerns.  SNelwyn Salisbury PButlerat [phone](clinic) 10/21/2021

## 2021-10-29 LAB — TYPE AND SCREEN
ABO/RH(D): O POS
Antibody Screen: NEGATIVE
Unit division: 0

## 2021-10-29 LAB — BPAM RBC
Blood Product Expiration Date: 202308222359
ISSUE DATE / TIME: 202307210907
Unit Type and Rh: 5100

## 2021-10-31 ENCOUNTER — Telehealth: Payer: Self-pay | Admitting: *Deleted

## 2021-10-31 ENCOUNTER — Other Ambulatory Visit: Payer: Self-pay

## 2021-10-31 NOTE — Telephone Encounter (Signed)
Spoke with patient. She has left a message with the oral surgeon's office. She has not heard back from the dentist. I encouraged her to call the dentist back to follow-up with the dentist.

## 2021-10-31 NOTE — Telephone Encounter (Signed)
Patient called reporting that she has a bad infection in her mouth from having teeth pulled a week ago. She is asking for something to be done.Her next chemotherapy is 8/11. She was given Augmentin 875 mg twice a day times 7 days at time of extraction 10/25/21

## 2021-10-31 NOTE — Telephone Encounter (Signed)
Josh- Please advise

## 2021-11-14 ENCOUNTER — Other Ambulatory Visit (INDEPENDENT_AMBULATORY_CARE_PROVIDER_SITE_OTHER): Payer: Self-pay | Admitting: Nurse Practitioner

## 2021-11-14 DIAGNOSIS — R0989 Other specified symptoms and signs involving the circulatory and respiratory systems: Secondary | ICD-10-CM

## 2021-11-15 ENCOUNTER — Ambulatory Visit (INDEPENDENT_AMBULATORY_CARE_PROVIDER_SITE_OTHER): Payer: Medicare Other | Admitting: Vascular Surgery

## 2021-11-15 ENCOUNTER — Encounter (INDEPENDENT_AMBULATORY_CARE_PROVIDER_SITE_OTHER): Payer: Medicare Other

## 2021-11-15 ENCOUNTER — Ambulatory Visit (INDEPENDENT_AMBULATORY_CARE_PROVIDER_SITE_OTHER): Payer: Medicare Other

## 2021-11-15 ENCOUNTER — Encounter (INDEPENDENT_AMBULATORY_CARE_PROVIDER_SITE_OTHER): Admitting: Vascular Surgery

## 2021-11-15 ENCOUNTER — Encounter (INDEPENDENT_AMBULATORY_CARE_PROVIDER_SITE_OTHER): Payer: Self-pay | Admitting: Vascular Surgery

## 2021-11-15 VITALS — BP 154/67 | HR 75 | Resp 16 | Wt 110.4 lb

## 2021-11-15 DIAGNOSIS — I6529 Occlusion and stenosis of unspecified carotid artery: Secondary | ICD-10-CM | POA: Insufficient documentation

## 2021-11-15 DIAGNOSIS — I10 Essential (primary) hypertension: Secondary | ICD-10-CM

## 2021-11-15 DIAGNOSIS — R0989 Other specified symptoms and signs involving the circulatory and respiratory systems: Secondary | ICD-10-CM

## 2021-11-15 DIAGNOSIS — I6523 Occlusion and stenosis of bilateral carotid arteries: Secondary | ICD-10-CM | POA: Diagnosis not present

## 2021-11-15 DIAGNOSIS — C569 Malignant neoplasm of unspecified ovary: Secondary | ICD-10-CM | POA: Diagnosis not present

## 2021-11-15 NOTE — Assessment & Plan Note (Signed)
carotid duplex was performed today.  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  We discussed this to be very mild and put her at no increased risk of stroke.  No role for intervention.  This can be checked every 2 to 3 years with duplex.  I would consider taking a baby aspirin daily.

## 2021-11-15 NOTE — Assessment & Plan Note (Signed)
I placed a port back in 2018 and is still being used regularly and working well.

## 2021-11-15 NOTE — Assessment & Plan Note (Signed)
blood pressure control important in reducing the progression of atherosclerotic disease. On appropriate oral medications.  

## 2021-11-15 NOTE — Progress Notes (Signed)
Patient ID: Katie Hill, female   DOB: 1950/04/26, 71 y.o.   MRN: [id number]  Chief Complaint  Patient presents with   New Patient (Initial Visit)    Ref Masoud left carotid bruit    HPI Katie Hill is a 71 y.o. female.  I am asked to see the patient by Dr. Lavera Guise for evaluation of a carotid bruit.  The patient reports no focal neurologic symptoms of cerebrovascular ischemia.  Specifically, the patient denies amaurosis fugax, speech or swallowing difficulties, or arm or leg weakness or numbness.  I put in her Port-A-Cath about 5 years ago, but we have not seen her back since that time as we do not typically follow Port-A-Cath patients.  Given the finding of a carotid bruit, carotid duplex was performed today.  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   Pelvic mass in female     Past Surgical History:  Procedure Laterality Date   APPENDECTOMY     LAPAROSCOPY N/A 03/14/2017   Procedure: LAPAROSCOPY OPERATIVE;  Surgeon: Mellody Drown, MD;  Location: [facility];  Service: Gynecology;  Laterality: N/A;   LAPAROTOMY N/A 03/14/2017   Procedure: LAPAROTOMY;  Surgeon: Mellody Drown, MD;  Location: [facility];  Service: Gynecology;  Laterality: N/A;   LYMPH NODE DISSECTION N/A 03/14/2017   Procedure: LYMPH NODE DISSECTION;  Surgeon: Mellody Drown, MD;  Location: [facility];  Service: Gynecology;  Laterality: N/A;   OMENTECTOMY N/A 03/14/2017   Procedure: OMENTECTOMY;  Surgeon: Mellody Drown, MD;  Location: [facility];  Service: Gynecology;  Laterality: N/A;   PORTA CATH INSERTION N/A 11/27/2016   Procedure: Glori Luis Cath Insertion;  Surgeon: Algernon Huxley, MD;  Location: [facility];  Service: Cardiovascular;  Laterality: N/A;    Family  History  Problem Relation Age of Onset   Throat cancer Cousin    Throat cancer Cousin    Leukemia Cousin   No bleeding or clotting disorders   Social History   Tobacco Use   Smoking status: Never   Smokeless tobacco: Never  Vaping Use   Vaping Use: Never used  Substance Use Topics   Alcohol use: Not Currently   Drug use: No     Allergies  Allergen Reactions   Omeprazole Rash    Current Outpatient Medications  Medication Sig Dispense Refill   amLODipine (NORVASC) 5 MG tablet Take 1 tablet (5 mg total) by mouth daily. 30 tablet 1   amoxicillin-clavulanate (AUGMENTIN) 875-125 MG tablet Take 1 tablet by mouth 2 (two) times daily. 14 tablet 0   levothyroxine (SYNTHROID) 100 MCG tablet Take 1 tablet (100 mcg total) by mouth daily. 90 tablet 3   lidocaine-prilocaine (EMLA) cream Apply 1 application topically as needed. Apply small amount to port site at least 1 hour prior to it being accessed, cover with plastic wrap 30 g 1   losartan (COZAAR) 25 MG tablet Take 1 tablet (25 mg total) by mouth daily. 90 tablet 0   ondansetron (ZOFRAN) 8 MG tablet Take 1 tablet (8 mg total) by mouth every 8 (eight) hours as needed for nausea or vomiting. 60 tablet 1   No current facility-administered medications for this visit.      REVIEW OF SYSTEMS (Negative unless  checked)  Constitutional: '[x]'$ Weight loss  '[]'$ Fever  '[]'$ Chills Cardiac: '[]'$ Chest pain   '[]'$ Chest pressure   '[x]'$ Palpitations   '[]'$ Shortness of breath when laying flat   '[]'$ Shortness of breath at rest   '[]'$ Shortness of breath with exertion. Vascular:  '[]'$ Pain in legs with walking   '[]'$ Pain in legs at rest   '[]'$ Pain in legs when laying flat   '[]'$ Claudication   '[]'$ Pain in feet when walking  '[]'$ Pain in feet at rest  '[]'$ Pain in feet when laying flat   '[]'$ History of DVT   '[]'$ Phlebitis   '[]'$ Swelling in legs   '[]'$ Varicose veins   '[]'$ Non-healing ulcers Pulmonary:   '[]'$ Uses home oxygen   '[]'$ Productive cough   '[]'$ Hemoptysis   '[]'$ Wheeze  '[]'$ COPD   '[]'$ Asthma Neurologic:   '[]'$ Dizziness  '[]'$ Blackouts   '[]'$ Seizures   '[]'$ History of stroke   '[]'$ History of TIA  '[]'$ Aphasia   '[]'$ Temporary blindness   '[]'$ Dysphagia   '[]'$ Weakness or numbness in arms   '[]'$ Weakness or numbness in legs Musculoskeletal:  '[]'$ Arthritis   '[]'$ Joint swelling   '[]'$ Joint pain   '[]'$ Low back pain Hematologic:  '[]'$ Easy bruising  '[]'$ Easy bleeding   '[]'$ Hypercoagulable state   '[]'$ Anemic  '[]'$ Hepatitis Gastrointestinal:  '[]'$ Blood in stool   '[]'$ Vomiting blood  '[]'$ Gastroesophageal reflux/heartburn   '[]'$ Abdominal pain Genitourinary:  '[]'$ Chronic kidney disease   '[]'$ Difficult urination  '[]'$ Frequent urination  '[]'$ Burning with urination   '[]'$ Hematuria Skin:  '[]'$ Rashes   '[]'$ Ulcers   '[]'$ Wounds Psychological:  '[]'$ History of anxiety   '[]'$  History of major depression.    Physical Exam BP (!) 154/67 (BP Location: Left Arm)   Pulse 75   Resp 16   Wt 110 lb 6.4 oz (50.1 kg)   BMI 17.29 kg/m  Gen:  NAD, thin Head: [place]/AT, + temporalis wasting.  Ear/Nose/Throat: Hearing grossly intact, nares w/o erythema or drainage, oropharynx w/o Erythema/Exudate Eyes: Conjunctiva clear, sclera non-icteric  Neck: trachea midline.  Soft left carotid bruit Pulmonary:  Good air movement, clear to auscultation bilaterally.  Cardiac: RRR, normal S1, S2 Vascular:  Vessel Right Left  Radial Palpable Palpable                   Musculoskeletal: M/S 5/5 throughout.  Extremities without ischemic changes.  No deformity or atrophy. No edema. Neurologic: Sensation grossly intact in extremities.  Symmetrical.  Speech is fluent. Motor exam as listed above. Psychiatric: Judgment intact, Mood & affect appropriate for pt's clinical situation. Dermatologic: No rashes or ulcers noted.  No cellulitis or open wounds.    Radiology No results found.  Labs Recent Results (from the past 2160 hour(s))  Comprehensive metabolic panel     Status: Abnormal   Collection Time: 08/26/21  8:09 AM  Result Value Ref Range   Sodium 133 (L) 135 - 145 mmol/L   Potassium 3.8 3.5 - 5.1  mmol/L   Chloride 104 98 - 111 mmol/L   CO2 23 22 - 32 mmol/L   Glucose, Bld 102 (H) 70 - 99 mg/dL    Comment: Glucose reference range applies only to samples taken after fasting for at least 8 hours.   BUN 13 8 - 23 mg/dL   Creatinine, Ser 0.64 0.44 - 1.00 mg/dL   Calcium 9.3 8.9 - 10.3 mg/dL   Total Protein 6.4 (L) 6.5 - 8.1 g/dL   Albumin 3.7 3.5 - 5.0 g/dL   AST 19 15 - 41 U/L   ALT 9 0 - 44 U/L   Alkaline Phosphatase 51 38 - 126 U/L   Total  Bilirubin 0.7 0.3 - 1.2 mg/dL   GFR, Estimated >60 >60 mL/min    Comment: (NOTE) Calculated using the CKD-EPI Creatinine Equation (2021)    Anion gap 6 5 - 15    Comment: Performed at [facility], [address] [place], [place] [address]  CBC with Differential     Status: Abnormal   Collection Time: 08/26/21  8:09 AM  Result Value Ref Range   WBC 4.1 4.0 - 10.5 K/uL   RBC 2.76 (L) 3.87 - 5.11 MIL/uL   Hemoglobin 9.4 (L) 12.0 - 15.0 g/dL   HCT 29.0 (L) 36.0 - 46.0 %   MCV 105.1 (H) 80.0 - 100.0 fL   MCH 34.1 (H) 26.0 - 34.0 pg   MCHC 32.4 30.0 - 36.0 g/dL   RDW 15.7 (H) 11.5 - 15.5 %   Platelets 133 (L) 150 - 400 K/uL   nRBC 0.0 0.0 - 0.2 %   Neutrophils Relative % 65 %   Neutro Abs 2.7 1.7 - 7.7 K/uL   Lymphocytes Relative 19 %   Lymphs Abs 0.8 0.7 - 4.0 K/uL   Monocytes Relative 12 %   Monocytes Absolute 0.5 0.1 - 1.0 K/uL   Eosinophils Relative 3 %   Eosinophils Absolute 0.1 0.0 - 0.5 K/uL   Basophils Relative 1 %   Basophils Absolute 0.1 0.0 - 0.1 K/uL   Immature Granulocytes 0 %   Abs Immature Granulocytes 0.01 0.00 - 0.07 K/uL    Comment: Performed at [facility], [address] [place], [place] [address]  Protein, urine, random     Status: None   Collection Time: 08/26/21  8:09 AM  Result Value Ref Range   Total Protein, Urine 164 mg/dL    Comment: RESULT CONFIRMED BY MANUAL DILUTION.PMF NO NORMAL RANGE ESTABLISHED FOR THIS TEST Performed at [facility], [place],  [place] [address]   CA 125     Status: None   Collection Time: 08/26/21  8:09 AM  Result Value Ref Range   Cancer Antigen (CA) 125 13.3 0.0 - 38.1 U/mL    Comment: (NOTE) Roche [employer] Electrochemiluminescence Immunoassay (ECLIA) Values obtained with different assay methods or kits cannot be used interchangeably.  Results cannot be interpreted as absolute evidence of the presence or absence of malignant disease. Performed At: [facility] [place], [place] [id number] Rush Farmer MD VP:[id number]   Comprehensive metabolic panel     Status: Abnormal   Collection Time: 09/23/21  8:17 AM  Result Value Ref Range   Sodium 132 (L) 135 - 145 mmol/L   Potassium 4.0 3.5 - 5.1 mmol/L   Chloride 104 98 - 111 mmol/L   CO2 22 22 - 32 mmol/L   Glucose, Bld 88 70 - 99 mg/dL    Comment: Glucose reference range applies only to samples taken after fasting for at least 8 hours.   BUN 16 8 - 23 mg/dL   Creatinine, Ser 0.80 0.44 - 1.00 mg/dL   Calcium 9.1 8.9 - 10.3 mg/dL   Total Protein 6.8 6.5 - 8.1 g/dL   Albumin 3.7 3.5 - 5.0 g/dL   AST 18 15 - 41 U/L   ALT 9 0 - 44 U/L   Alkaline Phosphatase 60 38 - 126 U/L   Total Bilirubin 0.7 0.3 - 1.2 mg/dL   GFR, Estimated >60 >60 mL/min    Comment: (NOTE) Calculated using the CKD-EPI Creatinine Equation (2021)    Anion gap 6  5 - 15    Comment: Performed at [facility], [address] [place], [place] [address]  CBC with Differential/Platelet     Status: Abnormal   Collection Time: 09/23/21  8:17 AM  Result Value Ref Range   WBC 4.9 4.0 - 10.5 K/uL   RBC 2.57 (L) 3.87 - 5.11 MIL/uL   Hemoglobin 8.9 (L) 12.0 - 15.0 g/dL   HCT 26.8 (L) 36.0 - 46.0 %   MCV 104.3 (H) 80.0 - 100.0 fL   MCH 34.6 (H) 26.0 - 34.0 pg   MCHC 33.2 30.0 - 36.0 g/dL   RDW 15.6 (H) 11.5 - 15.5 %   Platelets 100 (L) 150 - 400 K/uL   nRBC 0.0 0.0 - 0.2 %   Neutrophils Relative % 69 %   Neutro Abs 3.4 1.7 - 7.7 K/uL   Lymphocytes Relative 16 %    Lymphs Abs 0.8 0.7 - 4.0 K/uL   Monocytes Relative 12 %   Monocytes Absolute 0.6 0.1 - 1.0 K/uL   Eosinophils Relative 2 %   Eosinophils Absolute 0.1 0.0 - 0.5 K/uL   Basophils Relative 1 %   Basophils Absolute 0.0 0.0 - 0.1 K/uL   Immature Granulocytes 0 %   Abs Immature Granulocytes 0.02 0.00 - 0.07 K/uL    Comment: Performed at [facility], [place], [place] [address]  CA 125     Status: None   Collection Time: 09/23/21  8:17 AM  Result Value Ref Range   Cancer Antigen (CA) 125 15.7 0.0 - 38.1 U/mL    Comment: (NOTE) [employer] Electrochemiluminescence Immunoassay (ECLIA) Values obtained with different assay methods or kits cannot be used interchangeably.  Results cannot be interpreted as absolute evidence of the presence or absence of malignant disease. Performed At: [facility] [place], [place] [id number] Rush Farmer MD IW:[id number]   Protein, urine, random     Status: None   Collection Time: 09/23/21  8:25 AM  Result Value Ref Range   Total Protein, Urine 190 mg/dL    Comment: RESULT CONFIRMED BY MANUAL DILUTION.PMF NO NORMAL RANGE ESTABLISHED FOR THIS TEST Performed at [facility], [address] [place], [place] [address]   ECHOCARDIOGRAM COMPLETE     Status: None   Collection Time: 10/14/21  4:36 PM  Result Value Ref Range   AR max vel 3.36 cm2   AV Peak grad 4.1 mmHg   Ao pk vel 1.01 m/s   S' Lateral 3.60 cm   Area-P 1/2 4.99 cm2   AV Area VTI 3.06 cm2   AV Mean grad 2.0 mmHg   Single Plane A4C EF 52.5 %   Single Plane A2C EF 55.7 %   Calc EF 54.3 %   AV Area mean vel 2.87 cm2   AV Vena cont 0.20 cm  Comprehensive metabolic panel     Status: Abnormal   Collection Time: 10/21/21 12:39 PM  Result Value Ref Range   Sodium 133 (L) 135 - 145 mmol/L   Potassium 3.7 3.5 - 5.1 mmol/L   Chloride 105 98 - 111 mmol/L   CO2 22 22 - 32 mmol/L   Glucose, Bld 90 70 - 99 mg/dL    Comment: Glucose  reference range applies only to samples taken after fasting for at least 8 hours.   BUN 22 8 - 23 mg/dL   Creatinine, Ser 0.99 0.44 - 1.00 mg/dL   Calcium 9.2 8.9 - 10.3 mg/dL  Total Protein 6.8 6.5 - 8.1 g/dL   Albumin 3.8 3.5 - 5.0 g/dL   AST 18 15 - 41 U/L   ALT 9 0 - 44 U/L   Alkaline Phosphatase 56 38 - 126 U/L   Total Bilirubin 1.0 0.3 - 1.2 mg/dL   GFR, Estimated >60 >60 mL/min    Comment: (NOTE) Calculated using the CKD-EPI Creatinine Equation (2021)    Anion gap 6 5 - 15    Comment: Performed at [facility], [address] [place], [place] [address]  CBC with Differential/Platelet     Status: Abnormal   Collection Time: 10/21/21 12:39 PM  Result Value Ref Range   WBC 4.3 4.0 - 10.5 K/uL   RBC 2.35 (L) 3.87 - 5.11 MIL/uL   Hemoglobin 8.2 (L) 12.0 - 15.0 g/dL   HCT 24.8 (L) 36.0 - 46.0 %   MCV 105.5 (H) 80.0 - 100.0 fL   MCH 34.9 (H) 26.0 - 34.0 pg   MCHC 33.1 30.0 - 36.0 g/dL   RDW 15.7 (H) 11.5 - 15.5 %   Platelets 111 (L) 150 - 400 K/uL   nRBC 0.0 0.0 - 0.2 %   Neutrophils Relative % 64 %   Neutro Abs 2.8 1.7 - 7.7 K/uL   Lymphocytes Relative 20 %   Lymphs Abs 0.9 0.7 - 4.0 K/uL   Monocytes Relative 13 %   Monocytes Absolute 0.5 0.1 - 1.0 K/uL   Eosinophils Relative 2 %   Eosinophils Absolute 0.1 0.0 - 0.5 K/uL   Basophils Relative 1 %   Basophils Absolute 0.0 0.0 - 0.1 K/uL   Immature Granulocytes 0 %   Abs Immature Granulocytes 0.01 0.00 - 0.07 K/uL    Comment: Performed at [facility], [address] [place], [place] [address]  Sample to Blood Bank     Status: None   Collection Time: 10/27/21 10:12 AM  Result Value Ref Range   Blood Bank Specimen SAMPLE AVAILABLE FOR TESTING    Sample Expiration      10/30/2021,2359 Performed at [facility], [address] [place], [place] [address]   CBC with Differential/Platelet     Status: Abnormal   Collection Time: 10/27/21 10:12 AM  Result Value Ref Range   WBC 14.2 (H) 4.0 - 10.5  K/uL   RBC 2.31 (L) 3.87 - 5.11 MIL/uL   Hemoglobin 8.0 (L) 12.0 - 15.0 g/dL   HCT 24.9 (L) 36.0 - 46.0 %   MCV 107.8 (H) 80.0 - 100.0 fL   MCH 34.6 (H) 26.0 - 34.0 pg   MCHC 32.1 30.0 - 36.0 g/dL   RDW 15.1 11.5 - 15.5 %   Platelets 111 (L) 150 - 400 K/uL    Comment: SPECIMEN CHECKED FOR CLOTS   nRBC 0.0 0.0 - 0.2 %   Neutrophils Relative % 86 %   Neutro Abs 12.3 (H) 1.7 - 7.7 K/uL   Lymphocytes Relative 5 %   Lymphs Abs 0.7 0.7 - 4.0 K/uL   Monocytes Relative 5 %   Monocytes Absolute 0.8 0.1 - 1.0 K/uL   Eosinophils Relative 1 %   Eosinophils Absolute 0.1 0.0 - 0.5 K/uL   Basophils Relative 0 %   Basophils Absolute 0.0 0.0 - 0.1 K/uL   WBC Morphology MILD LEFT SHIFT (1-5% METAS, OCC MYELO, OCC BANDS)     Comment: DIFF CONFIRMED BY MANUAL.   RBC Morphology UNREMARKABLE    Smear Review Normal platelet morphology  Comment: PLATELETS APPEAR DECREASED   Immature Granulocytes 3 %   Abs Immature Granulocytes 0.40 (H) 0.00 - 0.07 K/uL    Comment: Performed at [facility], [address] [place], [place] [address]  Type and screen     Status: None   Collection Time: 10/27/21 10:12 AM  Result Value Ref Range   ABO/RH(D) O POS    Antibody Screen NEG    Sample Expiration 10/30/2021,2359    Unit Number [id number]    Blood Component Type RBC LR PHER1    Unit division 00    Status of Unit ISSUED,FINAL    Transfusion Status OK TO TRANSFUSE    Crossmatch Result      Compatible Performed at [facility], [address] [place], [place] [address]   BPAM RBC     Status: None   Collection Time: 10/27/21 10:12 AM  Result Value Ref Range   ISSUE DATE / TIME [id number]    Blood Product Unit Number [id number]    PRODUCT CODE [id number]    Unit Type and Rh 5100    Blood Product Expiration Date [id number]   Prepare RBC (crossmatch)     Status: None   Collection Time: 10/27/21  1:30 PM  Result Value Ref Range   Order Confirmation      ORDER PROCESSED BY  BLOOD BANK Performed at [facility], [address], [place], [place] [address]     Assessment/Plan:  Carotid stenosis carotid duplex was performed today.  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  We discussed this to be very mild and put her at no increased risk of stroke.  No role for intervention.  This can be checked every 2 to 3 years with duplex.  I would consider taking a baby aspirin daily.  Hypertension blood pressure control important in reducing the progression of atherosclerotic disease. On appropriate oral medications.   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      Leotis Pain 11/15/2021, 10:49 AM   This note was created with Dragon medical transcription system.  Any errors from dictation are unintentional.

## 2021-11-16 ENCOUNTER — Other Ambulatory Visit: Payer: Self-pay

## 2021-11-18 ENCOUNTER — Inpatient Hospital Stay: Payer: Medicare Other

## 2021-11-18 ENCOUNTER — Inpatient Hospital Stay (HOSPITAL_BASED_OUTPATIENT_CLINIC_OR_DEPARTMENT_OTHER): Payer: Medicare Other | Admitting: Oncology

## 2021-11-18 ENCOUNTER — Encounter: Payer: Self-pay | Admitting: Oncology

## 2021-11-18 ENCOUNTER — Inpatient Hospital Stay: Payer: Medicare Other | Attending: Oncology

## 2021-11-18 VITALS — BP 159/80 | HR 72 | Temp 97.5°F | Ht 70.0 in | Wt 110.0 lb

## 2021-11-18 DIAGNOSIS — Z5189 Encounter for other specified aftercare: Secondary | ICD-10-CM | POA: Insufficient documentation

## 2021-11-18 DIAGNOSIS — R808 Other proteinuria: Secondary | ICD-10-CM | POA: Diagnosis not present

## 2021-11-18 DIAGNOSIS — E871 Hypo-osmolality and hyponatremia: Secondary | ICD-10-CM | POA: Diagnosis not present

## 2021-11-18 DIAGNOSIS — R809 Proteinuria, unspecified: Secondary | ICD-10-CM

## 2021-11-18 DIAGNOSIS — C569 Malignant neoplasm of unspecified ovary: Secondary | ICD-10-CM | POA: Diagnosis not present

## 2021-11-18 DIAGNOSIS — C561 Malignant neoplasm of right ovary: Secondary | ICD-10-CM

## 2021-11-18 DIAGNOSIS — C563 Malignant neoplasm of bilateral ovaries: Secondary | ICD-10-CM

## 2021-11-18 DIAGNOSIS — I10 Essential (primary) hypertension: Secondary | ICD-10-CM | POA: Diagnosis not present

## 2021-11-18 DIAGNOSIS — Z5111 Encounter for antineoplastic chemotherapy: Secondary | ICD-10-CM

## 2021-11-18 DIAGNOSIS — Z79899 Other long term (current) drug therapy: Secondary | ICD-10-CM

## 2021-11-18 LAB — COMPREHENSIVE METABOLIC PANEL
ALT: 9 U/L (ref 0–44)
AST: 17 U/L (ref 15–41)
Albumin: 3.8 g/dL (ref 3.5–5.0)
Alkaline Phosphatase: 61 U/L (ref 38–126)
Anion gap: 9 (ref 5–15)
BUN: 18 mg/dL (ref 8–23)
CO2: 22 mmol/L (ref 22–32)
Calcium: 9.3 mg/dL (ref 8.9–10.3)
Chloride: 103 mmol/L (ref 98–111)
Creatinine, Ser: 0.83 mg/dL (ref 0.44–1.00)
GFR, Estimated: >60 mL/min (ref >60)
Glucose, Bld: 93 mg/dL (ref 70–99)
Potassium: 4.4 mmol/L (ref 3.5–5.1)
Sodium: 134 mmol/L — ABNORMAL LOW (ref 135–145)
Total Bilirubin: 0.4 mg/dL (ref 0.3–1.2)
Total Protein: 6.6 g/dL (ref 6.5–8.1)

## 2021-11-18 LAB — CBC WITH DIFFERENTIAL/PLATELET
Abs Immature Granulocytes: 0.02 10*3/uL (ref 0.00–0.07)
Basophils Absolute: 0 10*3/uL (ref 0.0–0.1)
Basophils Relative: 1 %
Eosinophils Absolute: 0.1 10*3/uL (ref 0.0–0.5)
Eosinophils Relative: 2 %
HCT: 25.4 % — ABNORMAL LOW (ref 36.0–46.0)
Hemoglobin: 8.5 g/dL — ABNORMAL LOW (ref 12.0–15.0)
Immature Granulocytes: 1 %
Lymphocytes Relative: 18 %
Lymphs Abs: 0.8 10*3/uL (ref 0.7–4.0)
MCH: 34.1 pg — ABNORMAL HIGH (ref 26.0–34.0)
MCHC: 33.5 g/dL (ref 30.0–36.0)
MCV: 102 fL — ABNORMAL HIGH (ref 80.0–100.0)
Monocytes Absolute: 0.6 10*3/uL (ref 0.1–1.0)
Monocytes Relative: 15 %
Neutro Abs: 2.6 10*3/uL (ref 1.7–7.7)
Neutrophils Relative %: 63 %
Platelets: 107 10*3/uL — ABNORMAL LOW (ref 150–400)
RBC: 2.49 MIL/uL — ABNORMAL LOW (ref 3.87–5.11)
RDW: 15.9 % — ABNORMAL HIGH (ref 11.5–15.5)
WBC: 4.1 10*3/uL (ref 4.0–10.5)
nRBC: 0 % (ref 0.0–0.2)

## 2021-11-18 LAB — PROTEIN, URINE, RANDOM: Total Protein, Urine: 125 mg/dL

## 2021-11-18 MED ORDER — DOXORUBICIN HCL LIPOSOMAL CHEMO INJECTION 2 MG/ML
40.0000 mg/m2 | Freq: Once | INTRAVENOUS | Status: AC
  Administered 2021-11-18: 64 mg via INTRAVENOUS
  Filled 2021-11-18: qty 10

## 2021-11-18 MED ORDER — DEXTROSE 5 % IV SOLN
Freq: Once | INTRAVENOUS | Status: AC
  Filled 2021-11-18: qty 250

## 2021-11-18 MED ORDER — PEGFILGRASTIM 6 MG/0.6ML ~~LOC~~ PSKT
6.0000 mg | PREFILLED_SYRINGE | Freq: Once | SUBCUTANEOUS | Status: AC
  Administered 2021-11-18: 6 mg via SUBCUTANEOUS

## 2021-11-18 MED ORDER — PEGFILGRASTIM 6 MG/0.6ML ~~LOC~~ PSKT
PREFILLED_SYRINGE | SUBCUTANEOUS | Status: AC
  Filled 2021-11-18: qty 0.6

## 2021-11-18 MED ORDER — HEPARIN SOD (PORK) LOCK FLUSH 100 UNIT/ML IV SOLN
500.0000 [IU] | Freq: Once | INTRAVENOUS | Status: AC | PRN
  Administered 2021-11-18: 500 [IU]
  Filled 2021-11-18: qty 5

## 2021-11-19 ENCOUNTER — Encounter: Payer: Self-pay | Admitting: Oncology

## 2021-11-19 NOTE — Assessment & Plan Note (Addendum)
Labs reviewed and discussed with patient. Proceed with Doxil today. Off bevacizumab due to proteinuria Repeat echocardiogram showed stable LVEF. Repeat CT in Sept 2023

## 2021-11-19 NOTE — Progress Notes (Signed)
Hematology/Oncology Progress note [phone] [phone]         Patient Care Team: Cletis Athens, MD as PCP - General (Internal Medicine) Clent Jacks, [name], [name], [name], MD as Referring Physician (Obstetrics and Gynecology)  ASSESSMENT & PLAN:   Cancer Staging  Malignant neoplasm of ovary [facility]) Staging form: Ovary, Fallopian Tube, and Primary Peritoneal Carcinoma, AJCC 8th Edition - Clinical stage from 11/22/2016: Stage IIIC (cT3c, cN1b, cM0) - Signed by Earlie Server, MD on 11/22/2016   Malignant neoplasm of ovary [facility]) Labs reviewed and discussed with patient. Proceed with Doxil today. Off bevacizumab due to proteinuria Repeat echocardiogram showed stable LVEF. Repeat CT in Sept 2023  Encounter for antineoplastic chemotherapy Chemotherapy plan as listed above.   Hyponatremia Secondary to chemotherapy.  S/p PRBC transfusion at last visit.  Continue monitor.   Proteinuria Continue hold bevacizumab.  Monitor monthly.   Refer to nephrologist  Hypertension Continue amlodipine 5 mg daily as well as losartan 25 mg daily.   Orders Placed This Encounter  Procedures   CT CHEST ABDOMEN PELVIS W CONTRAST    Standing Status:   Future    Standing Expiration Date:   11/19/2022    Order Specific Question:   Preferred imaging location?    Answer:   [facility]    Order Specific Question:   Is Oral Contrast requested for this exam?    Answer:   Yes, Per Radiology protocol   [facility] COMMUNICATION    Chemotherapy Appointment  - 3 hour    Follow-up in 4 weeks lab MD Doxil. All questions were answered. The patient knows to call the clinic with any problems, questions or concerns.  Earlie Server, MD, PhD [facility] Hematology Oncology 11/18/2021     CHIEF COMPLAINTS/PURPOSE OF Visit Follow up for  chemotherapy for ovarian cancer.  HISTORY OF PRESENTING ILLNESS: Katie Hill 71 y.o. female presents for follow up of management of recurrent  ovarian cancer. Oncology History  Malignant neoplasm of ovary ([place])  09/20/2011 Imaging   CT chest abdomen pelvis w contrast 1. No significant interval changed compared with the previous exam.2. Stable appearance of soft tissue attenuating lesion in the posterolateral right hemipelvis. This may represent a treated site of peritoneal disease.3. Unchanged appearance of borderline enlarged left periaortic and aortocaval retroperitoneal lymph nodes.4. Unchanged 6 mm left lower lobe lung nodule. 5. No new signs of metastatic disease within the chest, abdomen or pelvis. 6. Aortic Atherosclerosis (ICD10-I70.0) and Emphysema    11/20/2016 Initial Diagnosis   Malignant neoplasm of ovary   Pathology 11/16/2016 Surgical Pathology  CASE: [id number]  PATIENT: Katie Hill  Surgical Pathology Report   SPECIMEN SUBMITTED:  A. Retroperitoneal adenopathy, left  DIAGNOSIS:  A. LYMPH NODE, LEFT RETROPERITONEAL; CT-GUIDED CORE BIOPSY:  - METASTATIC HIGH-GRADE SEROUS CARCINOMA.  Pathology 03/14/2017   DIAGNOSIS:  A. OMENTUM; OMENTECTOMY:  - NO TUMOR SEEN.  - ONE NEGATIVE LYMPH NODE (0/1).   B.  RIGHT FALLOPIAN TUBE AND OVARY; SALPINGO-OOPHORECTOMY:  - SMALL FOCI OF HIGH GRADE SEROUS CARCINOMA INVOLVING THE OVARY.  - MARKED THERAPY RELATED CHANGE.  - NO TUMOR SEEN IN THE FALLOPIAN TUBE.   C.  UTERUS, CERVIX, LEFT FALLOPIAN TUBE AND OVARY; HYSTERECTOMY AND LEFT  SALPINGO-OOPHORECTOMY:  - NABOTHIAN CYSTS.  - CYSTIC ATROPHY OF THE ENDOMETRIUM.  - SEROSAL ADHESIONS.  - UNREMARKABLE FALLOPIAN TUBE.  - OVARY SHOWING TREATMENT  RELATED CHANGE.   D.  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    12/01/2016 -  Chemotherapy   Carboplatin and taxol x 4 neoadjuvant chemotherapy     03/14/2017 Surgery   Patient had debulking surgery on 03/14/2017. She had a laparoscopy with conversion to laparotomy, total hysterectomy, with bilateral salpingo oophorectomy, right aortic lymph node dissection, omentectomy. Pathology showed small foci of residual disease in ovary.   HRD positive, declined Olarparib maintenance trial    03/28/2017 -  Chemotherapy   Adjuvant carbo and taxol x3 cycles   02/28/2018 Progression   Local Recurrence. CA125 146 CT abdomen pelvis w contrast showed interval enlargement of an aortocaval lymph node which currently measures 1.4 cm in short axis (axial image 75 of series 2). This lymph node is in close proximity to the previously resected retroperitoneal lymphadenopathy and is very concerning for an additional nodal metastasis.    03/15/2018 -  Chemotherapy   03/15/2018-05/17/2018 Carboplatin and Taxol x 4.  CA125 decrease from 49.7 to 6 after 4 cycles of treatment.  05/17/2018-10/29/20   Olarparib 311m BID    11/05/2018 Imaging   MRI abdomenPelvis on 11/05/2018 showed Interval progression of, now measuring 2.7 x 2.3 cm and concerning for progression of metastatic disease.retrocaval lymph node in the abdomen Olaparib was held for short period of time. Her CA125 trended down again.  Dr. BFransisca Connorsrecommending resume olaparib and continue monitor.   10/06/2020 Progression    CT chest abdomen pelvis with contrast showed new lymph nodes in the prevascular space of the upper anterior mediastinum most consistent with metastasis.  Single new right lower lobe pulmonary nodule is concerning for early pulmonary metastasis.  Interval enlargement of the left periaortic retroperitoneal lymph node.  Stable adjacent aorto caval adenopathy.   11/03/2020 -  Chemotherapy   11/03/20 -05/20/21 Doxil + Bevacizumab q28d  06/03/21 Doxil only due to proteinuria.  07/01/21-07/15/21  Resumed on Doxil + Bevacizumab 07/29/21 Doxil only due to proteinuria.     Genetic Testing   Genetic  testing negative for 83 genes on [facility]'s Multi-Cancer panel (ALK, APC, ATM, AXIN2, BAP1, BARD1, BLM, BMPR1A, BRCA1, BRCA2, BRIP1, CASR, CDC73, CDH1, CDK4, CDKN1B, CDKN1C, CDKN2A, CEBPA, CHEK2, CTNNA1, DICER1, DIS3L2, EGFR, EPCAM, FH, FLCN, GATA2, GPC3, GREM1, HOXB13, HRAS, KIT, MAX, MEN1, MET, MITF, MLH1, MSH2, MSH3, MSH6, MUTYH, NBN, NF1, NF2, NTHL1, PALB2, PDGFRA, PHOX2B, PMS2, POLD1, POLE, POT1, PRKAR1A, PTCH1, PTEN, RAD50, RAD51C, RAD51D, RB1, RECQL4, RET, RUNX1, SDHA, SDHAF2, SDHB, SDHC, SDHD, SMAD4, SMARCA4, SMARCB1, SMARCE1, STK11, SUFU, TERC, TERT, TMEM127, TP53, TSC1, TSC2, VHL, WRN, WT1).   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  Myraid testing negative for somatic BRACA1/2, positive for HRD.    10/14/2021 Echocardiogram    Left ventricular ejection fraction, by estimation, is 55 to 60%   [place]  12/[place]/2019 Initial Diagnosis   [place] cancer ([place]   11/03/2020 -  Chemotherapy   11/03/20 -05/20/21 Doxil + Bevacizumab q28d  06/03/21 Doxil only due to proteinuria.  07/01/21-07/15/21  Resumed on Doxil + Bevacizumab 07/29/21 Doxil only due to proteinuria.        Patient was accompanied by her husband.   She takes norvasc 522mdaily, losartan 2564maily She feels well. Chronic fatigue, manageable  . Review of Systems  Constitutional:  Positive for fatigue. Negative for appetite change, chills and fever.  HENT:   Negative for hearing loss and voice change.  Eyes:  Negative for eye problems.  Respiratory:  Negative for chest tightness and cough.   Cardiovascular:  Negative for chest pain and leg swelling.  Gastrointestinal:  Negative for abdominal distention, abdominal pain and blood in stool.  Endocrine: Negative for hot flashes.  Genitourinary:  Negative for difficulty urinating and frequency.   Musculoskeletal:  Negative for arthralgias.  Skin:  Negative for itching and rash.  Neurological:  Negative for  extremity weakness.  Hematological:  Negative for adenopathy.  Psychiatric/Behavioral:  Negative for confusion.      [name] MEDICAL HISTORY: Past Medical History:  Diagnosis Date   Dysrhythmia    Genetic testing 03/28/2017   Multi-Cancer panel (83 genes) @ [facility] - No pathogenic mutations detected   High grade ovarian cancer ([place]) 11/20/2016   Pelvic mass in female     SURGICAL HISTORY: Past Surgical History:  Procedure Laterality Date   APPENDECTOMY     LAPAROSCOPY N/A 03/14/2017   Procedure: LAPAROSCOPY OPERATIVE;  Surgeon: Mellody Drown, MD;  Location: [facility] ORS;  Service: Gynecology;  Laterality: N/A;   LAPAROTOMY N/A 03/14/2017   Procedure: LAPAROTOMY;  Surgeon: Mellody Drown, MD;  Location: [facility] ORS;  Service: Gynecology;  Laterality: N/A;   LYMPH NODE DISSECTION N/A 03/14/2017   Procedure: LYMPH NODE DISSECTION;  Surgeon: Mellody Drown, MD;  Location: [facility] ORS;  Service: Gynecology;  Laterality: N/A;   OMENTECTOMY N/A 03/14/2017   Procedure: OMENTECTOMY;  Surgeon: Mellody Drown, MD;  Location: [facility] ORS;  Service: Gynecology;  Laterality: N/A;   PORTA CATH INSERTION N/A 11/27/2016   Procedure: Glori Luis Cath Insertion;  Surgeon: Algernon Huxley, MD;  Location: [place] [facility];  Service: Cardiovascular;  Laterality: N/A;    SOCIAL HISTORY: Social History   Tobacco Use   Smoking status: Never   Smokeless tobacco: Never  Vaping Use   Vaping Use: Never used  Substance Use Topics   Alcohol use: Not Currently   Drug use: No     FAMILY HISTORY Family History  Problem Relation Age of Onset   Throat cancer Cousin    Throat cancer Cousin    Leukemia Cousin     ALLERGIES:  is allergic to omeprazole.  MEDICATIONS:  Current Outpatient Medications  Medication Sig Dispense Refill   amLODipine (NORVASC) 5 MG tablet Take 1 tablet (5 mg total) by mouth daily. 30 tablet 1   amoxicillin-clavulanate (AUGMENTIN) 875-125 MG tablet Take 1 tablet by mouth 2 (two) times daily.  14 tablet 0   levothyroxine (SYNTHROID) 100 MCG tablet Take 1 tablet (100 mcg total) by mouth daily. 90 tablet 3   lidocaine-prilocaine (EMLA) cream Apply 1 application topically as needed. Apply small amount to port site at least 1 hour prior to it being accessed, cover with plastic wrap 30 g 1   losartan (COZAAR) 25 MG tablet Take 1 tablet (25 mg total) by mouth daily. 90 tablet 0   ondansetron (ZOFRAN) 8 MG tablet Take 1 tablet (8 mg total) by mouth every 8 (eight) hours as needed for nausea or vomiting. 60 tablet 1   No current facility-administered medications for this visit.   Facility-Administered Medications Ordered in Other Visits  Medication Dose Route Frequency Provider Last Rate Last Admin   pegfilgrastim (NEULASTA ONPRO KIT) 6 MG/0.6ML injection             PHYSICAL EXAMINATION:  ECOG PERFORMANCE STATUS: 0 - Asymptomatic Vitals:   11/18/21 1043  BP: (!) 159/80  Pulse: 72  Temp: (!) 97.5 F (36.4 C)  Filed Weights   11/18/21 1043  Weight: 110 lb (49.9 kg)     Physical Exam Constitutional:      General: She is not in acute distress.    Appearance: She is not diaphoretic.     Comments: Thin built, she walks independantly  HENT:     Head: Normocephalic and atraumatic.     Nose: Nose normal.     Mouth/Throat:     Pharynx: No oropharyngeal exudate.  Eyes:     General: No scleral icterus.    Pupils: Pupils are equal, round, and reactive to light.  Neck:     Vascular: No JVD.  Cardiovascular:     Rate and Rhythm: Normal rate and regular rhythm.     Heart sounds: No murmur heard. Pulmonary:     Effort: Pulmonary effort is normal. No respiratory distress.     Breath sounds: Normal breath sounds. No rales.  Chest:     Chest wall: No tenderness.  Abdominal:     General: Bowel sounds are normal. There is no distension.     Palpations: Abdomen is soft.     Tenderness: There is no abdominal tenderness.  Musculoskeletal:        General: Normal range of motion.      Cervical back: Normal range of motion and neck supple.  Lymphadenopathy:     Cervical: No cervical adenopathy.  Skin:    General: Skin is warm and dry.     Findings: No erythema or rash.     Comments: Right anterior medi port +   Neurological:     Mental Status: She is alert and oriented to person, place, and time.     Cranial Nerves: No cranial nerve deficit.     Motor: No abnormal muscle tone.     Coordination: Coordination normal.  Psychiatric:        Mood and Affect: Affect normal.        Judgment: Judgment normal.        LABORATORY DATA: I have personally reviewed the data as listed:     Latest Ref Rng & Units 11/18/2021   10:12 AM 10/27/2021   10:12 AM 10/21/2021   12:39 PM  CBC  WBC 4.0 - 10.5 K/uL 4.1  14.2  4.3   Hemoglobin 12.0 - 15.0 g/dL 8.5  8.0  8.2   Hematocrit 36.0 - 46.0 % 25.4  24.9  24.8   Platelets 150 - 400 K/uL 107  111  111       Latest Ref Rng & Units 11/18/2021   10:12 AM 10/21/2021   12:39 PM 09/23/2021    8:17 AM  CMP  Glucose 70 - 99 mg/dL 93  90  88   BUN 8 - 23 mg/dL _0 Creatinine 0.44 - 1.00 mg/dL 0.83  0.99  0.80   Sodium 135 - 145 mmol/L 134  133  132   Potassium 3.5 - 5.1 mmol/L 4.4  3.7  4.0   Chloride 98 - 111 mmol/L 103  105  104   CO2 22 - 32 mmol/L _1 Calcium 8.9 - 10.3 mg/dL 9.3  9.2  9.1   Total Protein 6.5 - 8.1 g/dL 6.6  6.8  6.8   Total Bilirubin 0.3 - 1.2 mg/dL 0.4  1.0  0.7   Alkaline Phos 38 - 126 U/L 61  56  60   AST 15 - 41 U/L 17  18  18   ALT 0 - 44 U/L _0 RADIOGRAPHIC STUDIES: I have personally reviewed the radiological images as listed and agreed with the findings in the report. VAS US CAROTID  Result Date: 11/18/2021 Carotid Arterial Duplex Study Patient Name:  Katie Hill  Date of Exam:   11/15/2021 Medical Rec #: [id number]         Accession #:    [id number] Date of Birth: May 25, 1950         Patient Gender: F Patient Age:   71 years Exam Location:  [facility]  Procedure:      VAS US CAROTID Referring Phys: Eulogio Ditch --------------------------------------------------------------------------------  Indications: Left bruit. Performing Technologist: Almira Coaster RVS  Examination Guidelines: A complete evaluation includes B-mode imaging, spectral Doppler, color Doppler, and power Doppler as needed of all accessible portions of each vessel. Bilateral testing is considered an integral part of a complete examination. Limited examinations for reoccurring indications may be performed as noted.  Right Carotid Findings: +----------+--------+--------+--------+------------------+--------+           PSV cm/sEDV cm/sStenosisPlaque DescriptionComments +----------+--------+--------+--------+------------------+--------+ CCA Prox  96      12                                         +----------+--------+--------+--------+------------------+--------+ CCA Mid   72      15                                         +----------+--------+--------+--------+------------------+--------+ CCA Distal53      13                                         +----------+--------+--------+--------+------------------+--------+ ICA Prox  52      12                                         +----------+--------+--------+--------+------------------+--------+ ICA Mid   99      32                                         +----------+--------+--------+--------+------------------+--------+ ICA Distal95      29                                         +----------+--------+--------+--------+------------------+--------+ ECA       58      0                                          +----------+--------+--------+--------+------------------+--------+ +----------+--------+-------+--------+-------------------+           PSV cm/sEDV cmsDescribeArm Pressure (mmHG) +----------+--------+-------+--------+-------------------+ [id number]      0                                   +----------+--------+-------+--------+-------------------+ +---------+--------+--+--------+--+  VertebralPSV cm/s50EDV cm/s13 +---------+--------+--+--------+--+  Left Carotid Findings: +----------+--------+--------+--------+------------------+--------+           PSV cm/sEDV cm/sStenosisPlaque DescriptionComments +----------+--------+--------+--------+------------------+--------+ CCA Prox  83      18                                         +----------+--------+--------+--------+------------------+--------+ CCA Mid   92      22                                         +----------+--------+--------+--------+------------------+--------+ CCA Distal70      21                                         +----------+--------+--------+--------+------------------+--------+ ICA Prox  37      13                                         +----------+--------+--------+--------+------------------+--------+ ICA Mid   83      22                                         +----------+--------+--------+--------+------------------+--------+ ICA Distal99      21                                         +----------+--------+--------+--------+------------------+--------+ ECA       55      7                                          +----------+--------+--------+--------+------------------+--------+ +----------+--------+--------+--------+-------------------+           PSV cm/sEDV cm/sDescribeArm Pressure (mmHG) +----------+--------+--------+--------+-------------------+ Subclavian101     0                                   +----------+--------+--------+--------+-------------------+ +---------+--------+--+--------+--+ VertebralPSV cm/s44EDV cm/s13 +---------+--------+--+--------+--+   Summary: Right Carotid: Velocities in the right ICA are consistent with a 1-39% stenosis. Left Carotid: Velocities in the left ICA are consistent with a 1-39% stenosis. Vertebrals:  Bilateral vertebral arteries demonstrate antegrade flow. *See table(s) above for measurements and observations.  Electronically signed by Leotis Pain MD on 11/18/2021 at 9:46:40 AM.    Final    ECHOCARDIOGRAM COMPLETE  Result Date: 10/14/2021    ECHOCARDIOGRAM REPORT   Patient Name:   Katie Hill Date of Exam: 10/14/2021 Medical Rec #:  [id number]        Height:       67.0 in Accession #:    [id number]       Weight:       113.0 lb Date of Birth:  May 05, 1950        BSA:          1.587 m  Patient Age:    28 years         BP:           158/92 mmHg Patient Gender: F                HR:           76 bpm. Exam Location:  [place] Procedure: 2D Echo, Cardiac Doppler and Color Doppler Indications:    Z09, Cardiotoxic drug therapy  History:        Patient has prior history of Echocardiogram examinations, most                 recent 01/14/2021. Risk Factors:Hypertension and Non-Smoker.                 Currently receiving chemotherapy.  Sonographer:    Pilar Jarvis RDMS, RVT, RDCS Referring Phys: [id number] [facility] Quaneisha Hanisch  Sonographer Comments: Global longitudinal strain was attempted. Very limited acoustic windows IMPRESSIONS  1. Left ventricular ejection fraction, by estimation, is 55 to 60%. The left ventricle has normal function. The left ventricle has no regional wall motion abnormalities. Left ventricular diastolic parameters were normal. The average left ventricular global longitudinal strain is -13.8 %.  2. Right ventricular systolic function is normal. The right ventricular size is normal. There is normal pulmonary artery systolic pressure. The estimated right ventricular systolic pressure is 52.8 mmHg.  3. The mitral valve is normal in structure. Mild mitral valve regurgitation. No evidence of mitral stenosis.  4. The aortic valve is normal in structure. Aortic valve regurgitation is mild. No aortic stenosis is present.  5. The inferior vena cava is normal in size with greater than 50% respiratory variability, suggesting right  atrial pressure of 3 mmHg. FINDINGS  Left Ventricle: Left ventricular ejection fraction, by estimation, is 55 to 60%. The left ventricle has normal function. The left ventricle has no regional wall motion abnormalities. The average left ventricular global longitudinal strain is -13.8 %. The left ventricular internal cavity size was normal in size. There is no left ventricular hypertrophy. Left ventricular diastolic parameters were normal. Right Ventricle: The right ventricular size is normal. No increase in right ventricular wall thickness. Right ventricular systolic function is normal. There is normal pulmonary artery systolic pressure. The tricuspid regurgitant velocity is 2.38 m/s, and  with an assumed right atrial pressure of 5 mmHg, the estimated right ventricular systolic pressure is 41.3 mmHg. Left Atrium: Left atrial size was normal in size. Right Atrium: Right atrial size was normal in size. Pericardium: There is no evidence of pericardial effusion. Mitral Valve: The mitral valve is normal in structure. Mild mitral valve regurgitation. No evidence of mitral valve stenosis. Tricuspid Valve: The tricuspid valve is normal in structure. Tricuspid valve regurgitation is mild . No evidence of tricuspid stenosis. Aortic Valve: The aortic valve is normal in structure. Aortic valve regurgitation is mild. No aortic stenosis is present. Aortic valve mean gradient measures 2.0 mmHg. Aortic valve peak gradient measures 4.1 mmHg. Aortic valve area, by VTI measures 3.06 cm. Pulmonic Valve: The pulmonic valve was normal in structure. Pulmonic valve regurgitation is not visualized. No evidence of pulmonic stenosis. Aorta: The aortic root is normal in size and structure. Venous: The inferior vena cava is normal in size with greater than 50% respiratory variability, suggesting right atrial pressure of 3 mmHg. IAS/Shunts: No atrial level shunt detected by color flow Doppler.  LEFT VENTRICLE PLAX 2D LVIDd:         5.00 cm  Diastology LVIDs:         3.60 cm     LV e' medial:    7.83 cm/s LV PW:         1.10 cm     LV E/e' medial:  10.9 LV IVS:        1.00 cm     LV e' lateral:   8.05 cm/s LVOT diam:     2.10 cm     LV E/e' lateral: 10.6 LV SV:         63 LV SV Index:   40          2D Longitudinal Strain LVOT Area:     3.46 cm    2D Strain GLS Avg:     -13.8 %  LV Volumes (MOD) LV vol d, MOD A2C: 98.3 ml LV vol d, MOD A4C: 69.1 ml LV vol s, MOD A2C: 43.5 ml LV vol s, MOD A4C: 32.8 ml LV SV MOD A2C:     54.8 ml LV SV MOD A4C:     69.1 ml LV SV MOD BP:      45.3 ml RIGHT VENTRICLE             IVC RV S prime:     12.20 cm/s  IVC diam: 1.80 cm RVOT diam:      2.55 cm TAPSE (M-mode): 2.6 cm LEFT ATRIUM             Index        RIGHT ATRIUM           Index LA diam:        3.60 cm 2.27 cm/m   RA Area:     20.20 cm LA Vol (A2C):   80.5 ml 50.74 ml/m  RA Volume:   62.10 ml  39.14 ml/m LA Vol (A4C):   31.7 ml 19.98 ml/m LA Biplane Vol: 52.2 ml 32.90 ml/m  AORTIC VALVE                    PULMONIC VALVE AV Area (Vmax):    3.36 cm     PV Vmax:       0.72 m/s AV Area (Vmean):   2.87 cm     PV Peak grad:  2.1 mmHg AV Area (VTI):     3.06 cm AV Vmax:           101.00 cm/s AV Vmean:          74.900 cm/s AV VTI:            0.205 m AV Peak Grad:      4.1 mmHg AV Mean Grad:      2.0 mmHg LVOT Vmax:         97.90 cm/s LVOT Vmean:        62.100 cm/s LVOT VTI:          0.181 m LVOT/AV VTI ratio: 0.88 AR Vena Contracta: 0.20 cm  AORTA Ao Root diam: 3.30 cm Ao Asc diam:  3.20 cm Ao Arch diam: 2.5 cm MITRAL VALVE               TRICUSPID VALVE MV Area (PHT): 4.99 cm    TR Peak grad:   22.7 mmHg MV Decel Time: 152 msec    TR Vmax:        238.00 cm/s MV E velocity: 85.70 cm/s MV A velocity: 58.30 cm/s  SHUNTS MV E/A ratio:  1.47  Systemic VTI:  0.18 m                            Systemic Diam: 2.10 cm                            Pulmonic Diam: 2.55 cm Ida Rogue MD Electronically signed by Ida Rogue MD Signature Date/Time: 10/14/2021/4:45:02  PM    Final    CT CHEST ABDOMEN PELVIS W CONTRAST  Result Date: 09/20/2021 CLINICAL DATA:  Ovarian cancer restaging.  05/16/2021 EXAM: CT CHEST, ABDOMEN, AND PELVIS WITH CONTRAST TECHNIQUE: Multidetector CT imaging of the chest, abdomen and pelvis was performed following the standard protocol during bolus administration of intravenous contrast. RADIATION DOSE REDUCTION: This exam was performed according to the departmental dose-optimization program which includes automated exposure control, adjustment of the mA and/or kV according to patient size and/or use of iterative reconstruction technique. CONTRAST:  128m OMNIPAQUE IOHEXOL 300 MG/ML  SOLN COMPARISON:  05/16/2021 FINDINGS: CT CHEST FINDINGS Cardiovascular: Mild cardiac enlargement. No pericardial effusion. Mild aortic atherosclerosis. Mediastinum/Nodes: No enlarged supraclavicular, axillary, mediastinal, or hilar lymph nodes. Trachea, thyroid gland and esophagus demonstrate no significant findings. Lungs/Pleura: No pleural effusions. Stable 6 mm left lower lobe lung nodule, image 87/5. No new or enlarging lung nodules. Musculoskeletal: No chest wall mass or suspicious bone lesions identified. CT ABDOMEN PELVIS FINDINGS Hepatobiliary: No focal liver abnormality is seen. No gallstones, gallbladder wall thickening, or biliary dilatation. Pancreas: Unremarkable. No pancreatic ductal dilatation or surrounding inflammatory changes. Spleen: Normal in size without focal abnormality. Adrenals/Urinary Tract: Normal adrenal glands. No kidney mass or hydronephrosis. Unchanged bilateral too small to reliably characterize low-density kidney lesions compatible with Bosniak class 2 cysts. No follow-up recommended. No nephrolithiasis or hydronephrosis. Urinary bladder appears normal. Stomach/Bowel: Small hiatal hernia. No bowel wall thickening, inflammation, or distension. Vascular/Lymphatic: Aortic atherosclerosis.  No aneurysm. The previous index left periaortic  retroperitoneal lymph node measures 1.1 cm, image 65/2. Previously 1.3 cm. 1 cm aortocaval lymph node is unchanged, image 72/2. No enlarged pelvic or inguinal lymph nodes. Reproductive: Status post hysterectomy. No adnexal masses. Other: Status post omentectomy. Previously referenced low-density lesion within the right posterior pelvis measures 3.0 x 1.4 cm, image 106/2. Previously 3.3 x 1.4 cm. No significant ascites. Musculoskeletal: No acute or significant osseous findings. IMPRESSION: 1. No significant interval changed compared with the previous exam. 2. Stable appearance of soft tissue attenuating lesion in the posterolateral right hemipelvis. This may represent a treated site of peritoneal disease. 3. Unchanged appearance of borderline enlarged left periaortic and aortocaval retroperitoneal lymph nodes. 4. Unchanged 6 mm left lower lobe lung nodule. 5. No new signs of metastatic disease within the chest, abdomen or pelvis. 6. Aortic Atherosclerosis (ICD10-I70.0) and Emphysema (ICD10-J43.9). * Tracking Code: BO * Electronically Signed   By: TKerby MoorsM.D.   On: 09/20/2021 08:18

## 2021-11-19 NOTE — Assessment & Plan Note (Signed)
Chemotherapy plan as listed above 

## 2021-11-19 NOTE — Assessment & Plan Note (Signed)
Continue hold bevacizumab.  Monitor monthly.   Refer to nephrologist

## 2021-11-19 NOTE — Assessment & Plan Note (Signed)
Continue amlodipine 5 mg daily as well as losartan 25 mg daily.

## 2021-11-19 NOTE — Assessment & Plan Note (Signed)
Secondary to chemotherapy.  S/p PRBC transfusion at last visit.  Continue monitor.

## 2021-11-21 ENCOUNTER — Telehealth: Payer: Self-pay

## 2021-11-21 MED FILL — Doxorubicin HCl Liposomal Inj (For IV Infusion) 2 MG/ML: INTRAVENOUS | Qty: 8 | Status: AC

## 2021-11-21 NOTE — Telephone Encounter (Signed)
Please add PRBC to next visit. Thanks

## 2021-11-21 NOTE — Telephone Encounter (Signed)
-----   Message from Earlie Server, MD sent at 11/19/2021  7:57 PM EDT ----- Please add PRBC transfusion to her next visit. I have ordered hold tube already. Thanks.

## 2021-11-21 NOTE — Telephone Encounter (Signed)
Ok to be D2

## 2021-11-21 NOTE — Telephone Encounter (Signed)
Ok to scheduled poss blood on Monday since tx is on Friday.

## 2021-11-22 ENCOUNTER — Encounter: Payer: Self-pay | Admitting: Oncology

## 2021-12-09 ENCOUNTER — Other Ambulatory Visit: Payer: Self-pay

## 2021-12-11 ENCOUNTER — Other Ambulatory Visit: Payer: Self-pay | Admitting: Oncology

## 2021-12-11 DIAGNOSIS — C569 Malignant neoplasm of unspecified ovary: Secondary | ICD-10-CM

## 2021-12-11 NOTE — Progress Notes (Signed)
ON PATHWAY REGIMEN - Ovarian  No Change  Continue With Treatment as Ordered.  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

## 2021-12-15 MED FILL — Dexamethasone Sodium Phosphate Inj 100 MG/10ML: INTRAMUSCULAR | Qty: 1 | Status: AC

## 2021-12-16 ENCOUNTER — Inpatient Hospital Stay: Payer: Medicare Other | Attending: Oncology

## 2021-12-16 ENCOUNTER — Encounter: Payer: Self-pay | Admitting: Oncology

## 2021-12-16 ENCOUNTER — Inpatient Hospital Stay (HOSPITAL_BASED_OUTPATIENT_CLINIC_OR_DEPARTMENT_OTHER): Payer: Medicare Other | Admitting: Oncology

## 2021-12-16 ENCOUNTER — Inpatient Hospital Stay: Payer: Medicare Other

## 2021-12-16 ENCOUNTER — Ambulatory Visit
Admission: RE | Admit: 2021-12-16 | Discharge: 2021-12-16 | Disposition: A | Discharge status: Home / Self Care | Payer: Medicare Other | Source: Ambulatory Visit | Attending: Oncology | Admitting: Oncology

## 2021-12-16 ENCOUNTER — Telehealth: Payer: Self-pay

## 2021-12-16 VITALS — BP 170/83 | HR 79 | Temp 98.7°F | Resp 20 | Wt 116.8 lb

## 2021-12-16 DIAGNOSIS — R809 Proteinuria, unspecified: Secondary | ICD-10-CM | POA: Insufficient documentation

## 2021-12-16 DIAGNOSIS — M7989 Other specified soft tissue disorders: Secondary | ICD-10-CM

## 2021-12-16 DIAGNOSIS — C569 Malignant neoplasm of unspecified ovary: Secondary | ICD-10-CM

## 2021-12-16 DIAGNOSIS — T451X5A Adverse effect of antineoplastic and immunosuppressive drugs, initial encounter: Secondary | ICD-10-CM

## 2021-12-16 DIAGNOSIS — D6481 Anemia due to antineoplastic chemotherapy: Secondary | ICD-10-CM | POA: Diagnosis not present

## 2021-12-16 DIAGNOSIS — Z5111 Encounter for antineoplastic chemotherapy: Secondary | ICD-10-CM

## 2021-12-16 DIAGNOSIS — E039 Hypothyroidism, unspecified: Secondary | ICD-10-CM | POA: Diagnosis not present

## 2021-12-16 DIAGNOSIS — C561 Malignant neoplasm of right ovary: Secondary | ICD-10-CM | POA: Insufficient documentation

## 2021-12-16 DIAGNOSIS — Z452 Encounter for adjustment and management of vascular access device: Secondary | ICD-10-CM | POA: Insufficient documentation

## 2021-12-16 DIAGNOSIS — M7122 Synovial cyst of popliteal space [Baker], left knee: Secondary | ICD-10-CM | POA: Diagnosis not present

## 2021-12-16 DIAGNOSIS — J90 Pleural effusion, not elsewhere classified: Secondary | ICD-10-CM | POA: Diagnosis not present

## 2021-12-16 DIAGNOSIS — I10 Essential (primary) hypertension: Secondary | ICD-10-CM | POA: Diagnosis not present

## 2021-12-16 DIAGNOSIS — Z79899 Other long term (current) drug therapy: Secondary | ICD-10-CM

## 2021-12-16 DIAGNOSIS — R808 Other proteinuria: Secondary | ICD-10-CM | POA: Diagnosis not present

## 2021-12-16 DIAGNOSIS — Z5181 Encounter for therapeutic drug level monitoring: Secondary | ICD-10-CM

## 2021-12-16 LAB — CBC WITH DIFFERENTIAL/PLATELET
Abs Immature Granulocytes: 0.02 10*3/uL (ref 0.00–0.07)
Basophils Absolute: 0 10*3/uL (ref 0.0–0.1)
Basophils Relative: 1 %
Eosinophils Absolute: 0.1 10*3/uL (ref 0.0–0.5)
Eosinophils Relative: 2 %
HCT: 23.1 % — ABNORMAL LOW (ref 36.0–46.0)
Hemoglobin: 7.5 g/dL — ABNORMAL LOW (ref 12.0–15.0)
Immature Granulocytes: 0 %
Lymphocytes Relative: 16 %
Lymphs Abs: 0.8 10*3/uL (ref 0.7–4.0)
MCH: 34.6 pg — ABNORMAL HIGH (ref 26.0–34.0)
MCHC: 32.5 g/dL (ref 30.0–36.0)
MCV: 106.5 fL — ABNORMAL HIGH (ref 80.0–100.0)
Monocytes Absolute: 0.7 10*3/uL (ref 0.1–1.0)
Monocytes Relative: 15 %
Neutro Abs: 3.1 10*3/uL (ref 1.7–7.7)
Neutrophils Relative %: 66 %
Platelets: 108 10*3/uL — ABNORMAL LOW (ref 150–400)
RBC: 2.17 MIL/uL — ABNORMAL LOW (ref 3.87–5.11)
RDW: 17.1 % — ABNORMAL HIGH (ref 11.5–15.5)
WBC: 4.7 10*3/uL (ref 4.0–10.5)
nRBC: 0 % (ref 0.0–0.2)

## 2021-12-16 LAB — COMPREHENSIVE METABOLIC PANEL
ALT: 12 U/L (ref 0–44)
AST: 22 U/L (ref 15–41)
Albumin: 3.6 g/dL (ref 3.5–5.0)
Alkaline Phosphatase: 59 U/L (ref 38–126)
Anion gap: 4 — ABNORMAL LOW (ref 5–15)
BUN: 23 mg/dL (ref 8–23)
CO2: 21 mmol/L — ABNORMAL LOW (ref 22–32)
Calcium: 9.2 mg/dL (ref 8.9–10.3)
Chloride: 109 mmol/L (ref 98–111)
Creatinine, Ser: 1.05 mg/dL — ABNORMAL HIGH (ref 0.44–1.00)
GFR, Estimated: 58 mL/min — ABNORMAL LOW (ref >60)
Glucose, Bld: 101 mg/dL — ABNORMAL HIGH (ref 70–99)
Potassium: 3.9 mmol/L (ref 3.5–5.1)
Sodium: 134 mmol/L — ABNORMAL LOW (ref 135–145)
Total Bilirubin: 0.5 mg/dL (ref 0.3–1.2)
Total Protein: 6.3 g/dL — ABNORMAL LOW (ref 6.5–8.1)

## 2021-12-16 LAB — SAMPLE TO BLOOD BANK

## 2021-12-16 MED ORDER — LOSARTAN POTASSIUM 50 MG PO TABS: 50.0000 mg | ORAL_TABLET | Freq: Every day | ORAL | 0 refills | Status: DC

## 2021-12-16 MED ORDER — HEPARIN SOD (PORK) LOCK FLUSH 100 UNIT/ML IV SOLN
500.0000 [IU] | Freq: Once | INTRAVENOUS | Status: AC
  Administered 2021-12-16: 500 [IU] via INTRAVENOUS
  Filled 2021-12-16: qty 5

## 2021-12-16 MED ORDER — FUROSEMIDE 20 MG PO TABS: 20.0000 mg | ORAL_TABLET | Freq: Every day | ORAL | 0 refills | Status: DC | PRN

## 2021-12-16 MED ORDER — DOXYCYCLINE HYCLATE 100 MG PO TABS: 100.0000 mg | ORAL_TABLET | Freq: Every day | ORAL | 0 refills | Status: DC

## 2021-12-16 NOTE — Assessment & Plan Note (Signed)
Chemotherapy plan as listed above 

## 2021-12-16 NOTE — Progress Notes (Signed)
Hematology/Oncology Progress note [phone] [phone]         Patient Care Team: Cletis Athens, MD as PCP - General (Internal Medicine) Clent Jacks, [name], [name], [name], MD as Referring Physician (Obstetrics and Gynecology)  ASSESSMENT & PLAN:   Cancer Staging  Malignant neoplasm of ovary [facility]) Staging form: Ovary, Fallopian Tube, and Primary Peritoneal Carcinoma, AJCC 8th Edition - Clinical stage from 11/22/2016: Stage IIIC (cT3c, cN1b, cM0) - Signed by Earlie Server, MD on 11/22/2016   Malignant neoplasm of ovary [facility]) Labs reviewed and discussed with patient. Hold treatment Repeat CT in Sept 2023  Encounter for antineoplastic chemotherapy Chemotherapy plan as listed above.   Anemia due to antineoplastic chemotherapy Secondary to chemotherapy.   She is relatively asymptomatic. Chemotherapy is held.  Monitor levels.   Hypertension discontinue amlodipine 5 mg daily  Increase losartan to 50 mg daily. Add lasix 69m daily PRN   Proteinuria Continue hold bevacizumab.  Monitor monthly.   Refer to nephrologist  Leg swelling STAT UKorealower extremities bilaterally - negative.  Repeat Echo.  Lasix 272mdaily PRN edema.  ? Cellulitis vs vein insufficiency dermatitis I will cover with doxycycline 10013maily x 5 days   Orders Placed This Encounter  Procedures   US Koreanous Img Lower Bilateral    Standing Status:   Future    Number of Occurrences:   1    Standing Expiration Date:   12/16/2022    Order Specific Question:   Reason for Exam (SYMPTOM  OR DIAGNOSIS REQUIRED)    Answer:   Bilateral Leg Swelling    Order Specific Question:   Preferred imaging location?    Answer:   [facility]   Protein, urine, random    Standing Status:   Future    Standing Expiration Date:   12/17/2022   CA 125     Standing Status:   Future    Standing Expiration Date:   12/31/2022   CBC with Differential    Standing Status:   Future    Standing Expiration Date:   12/31/2022   Comprehensive metabolic panel    Standing Status:   Future    Standing Expiration Date:   12/31/2022   Ambulatory referral to Nephrology    Referral Priority:   Routine    Referral Type:   Consultation    Referral Reason:   Specialty Services Required    Requested Specialty:   Nephrology    Number of Visits Requested:   1   ECHOCARDIOGRAM COMPLETE    Standing Status:   Future    Standing Expiration Date:   12/17/2022    Order Specific Question:   Where should this test be performed    Answer:   [facility]    Order Specific Question:   Perflutren DEFINITY (image enhancing agent) should be administered unless hypersensitivity or allergy exist    Answer:   Administer Perflutren    Order Specific Question:   Reason for exam-Echo    Answer:   Other-Full Diagnosis List    Order Specific Question:   Full ICD-10/Reason for Exam    Answer:   Edema of both lower legs [[id number]]Follow-up TBD All questions were answered. The patient knows to call the clinic with any problems, questions or concerns.  ZhoEarlie ServerD, PhD [facility] 12/16/2021  CHIEF COMPLAINTS/PURPOSE OF Visit Follow up for chemotherapy for ovarian cancer.  HISTORY OF PRESENTING ILLNESS: Katie Hill 71 y.o. female presents for follow up of management of recurrent  ovarian cancer. Oncology History  Malignant neoplasm of ovary ([place])  09/20/2011 Imaging   CT chest abdomen pelvis w contrast 1. No significant interval changed compared with the previous exam.2. Stable appearance of soft tissue attenuating lesion in the posterolateral right hemipelvis. This may represent a treated site of peritoneal disease.3. Unchanged appearance of borderline enlarged left periaortic and aortocaval retroperitoneal lymph nodes.4. Unchanged 6 mm left  lower lobe lung nodule. 5. No new signs of metastatic disease within the chest, abdomen or pelvis. 6. Aortic Atherosclerosis (ICD10-I70.0) and Emphysema    11/20/2016 Initial Diagnosis   Malignant neoplasm of ovary   Pathology 11/16/2016 Surgical Pathology  CASE: [id number]  PATIENT: Katie Hill  Surgical Pathology Report   SPECIMEN SUBMITTED:  A. Retroperitoneal adenopathy, left  DIAGNOSIS:  A. LYMPH NODE, LEFT RETROPERITONEAL; CT-GUIDED CORE BIOPSY:  - METASTATIC HIGH-GRADE SEROUS CARCINOMA.  Pathology 03/14/2017   DIAGNOSIS:  A. OMENTUM; OMENTECTOMY:  - NO TUMOR SEEN.  - ONE NEGATIVE LYMPH NODE (0/1).   B.  RIGHT FALLOPIAN TUBE AND OVARY; SALPINGO-OOPHORECTOMY:  - SMALL FOCI OF HIGH GRADE SEROUS CARCINOMA INVOLVING THE OVARY.  - MARKED THERAPY RELATED CHANGE.  - NO TUMOR SEEN IN THE FALLOPIAN TUBE.   C.  UTERUS, CERVIX, LEFT FALLOPIAN TUBE AND OVARY; HYSTERECTOMY AND LEFT  SALPINGO-OOPHORECTOMY:  - NABOTHIAN CYSTS.  - CYSTIC ATROPHY OF THE ENDOMETRIUM.  - SEROSAL ADHESIONS.  - UNREMARKABLE FALLOPIAN TUBE.  - OVARY SHOWING TREATMENT RELATED CHANGE.   D.  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    12/01/2016 -  Chemotherapy   Carboplatin and taxol x 4 neoadjuvant chemotherapy    03/14/2017 Surgery   Patient had debulking surgery on 03/14/2017. She had a laparoscopy with conversion to laparotomy, total hysterectomy, with bilateral salpingo oophorectomy, right aortic lymph node dissection, omentectomy. Pathology showed small foci of residual disease in ovary.   HRD positive, declined Olarparib maintenance trial    03/28/2017 -  Chemotherapy   Adjuvant carbo and taxol x3 cycles   02/28/2018 Progression   Local Recurrence. CA125 146 CT abdomen pelvis w contrast showed interval enlargement of an aortocaval lymph node which currently measures 1.4 cm in short axis (axial image 75 of series  2). This lymph node is in close proximity to the previously resected retroperitoneal lymphadenopathy and is very concerning for an additional nodal metastasis.    03/15/2018 -  Chemotherapy   03/15/2018-05/17/2018 Carboplatin and Taxol x 4.  CA125 decrease from 49.7 to 6 after 4 cycles of treatment.  05/17/2018-10/29/20   Olarparib 386m BID    11/05/2018 Imaging   MRI abdomenPelvis on 11/05/2018 showed Interval progression of, now measuring 2.7 x 2.3 cm and concerning for progression of metastatic disease.retrocaval lymph node in the abdomen Olaparib was held for short period of time. Her CA125 trended down again.  Dr. BFransisca Connorsrecommending resume olaparib and continue monitor.   10/06/2020 Progression    CT chest abdomen pelvis with contrast showed new lymph nodes in the prevascular space of the upper anterior mediastinum most consistent with metastasis.  Single new right lower lobe pulmonary nodule is concerning for early pulmonary metastasis.  Interval enlargement of the left periaortic retroperitoneal lymph node.  Stable adjacent aorto caval adenopathy.   11/03/2020 -  Chemotherapy   11/03/20 -05/20/21  Doxil + Bevacizumab q28d  06/03/21 Doxil only due to proteinuria.  07/01/21-07/15/21  Resumed on Doxil + Bevacizumab 07/29/21 Doxil only due to proteinuria.     Genetic Testing   Genetic testing negative for 83 genes on [facility]'s Multi-Cancer panel (ALK, APC, ATM, AXIN2, BAP1, BARD1, BLM, BMPR1A, BRCA1, BRCA2, BRIP1, CASR, CDC73, CDH1, CDK4, CDKN1B, CDKN1C, CDKN2A, CEBPA, CHEK2, CTNNA1, DICER1, DIS3L2, EGFR, EPCAM, FH, FLCN, GATA2, GPC3, GREM1, HOXB13, HRAS, KIT, MAX, MEN1, MET, MITF, MLH1, MSH2, MSH3, MSH6, MUTYH, NBN, NF1, NF2, NTHL1, PALB2, PDGFRA, PHOX2B, PMS2, POLD1, POLE, POT1, PRKAR1A, PTCH1, PTEN, RAD50, RAD51C, RAD51D, RB1, RECQL4, RET, RUNX1, SDHA, SDHAF2, SDHB, SDHC, SDHD, SMAD4, SMARCA4, SMARCB1, SMARCE1, STK11, SUFU, TERC, TERT, TMEM127, TP53, TSC1, TSC2, VHL, WRN, WT1).   A Variant of  Uncertain Significance was detected: CASR c.106G>A (p.Gly36Arg).  Myraid testing negative for somatic BRACA1/2, positive for HRD.    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  07/01/21-07/15/21  Resumed on Doxil + Bevacizumab 07/29/21 Doxil only due to proteinuria.    11/03/2020 -  Chemotherapy   Patient is on Treatment Plan : OVARIAN Liposomal Doxorubicin + Bevacizumab q28d         Patient was accompanied by her husband.   She takes norvasc 7m daily, losartan 251mdaily She feels well. Chronic fatigue, manageable  . Review of Systems  Constitutional:  Positive for fatigue. Negative for appetite change, chills and fever.  HENT:   Negative for hearing loss and voice change.   Eyes:  Negative for eye problems.  Respiratory:  Negative for chest tightness and cough.   Cardiovascular:  Negative for chest pain and leg swelling.  Gastrointestinal:  Negative for abdominal distention, abdominal pain and blood in stool.  Endocrine: Negative for hot flashes.  Genitourinary:  Negative for difficulty urinating and frequency.   Musculoskeletal:  Negative for arthralgias.  Skin:  Negative for itching and rash.  Neurological:  Negative for extremity weakness.  Hematological:  Negative for adenopathy.  Psychiatric/Behavioral:  Negative for confusion.      . Marland KitchenEDICAL HISTORY: Past Medical History:  Diagnosis Date   Dysrhythmia    Genetic testing 03/28/2017   Multi-Cancer panel (83 genes) @ [facility] - No pathogenic mutations detected   High grade ovarian cancer ([place]8/13/2018   Pelvic mass in female     SURGICAL  HISTORY: Past Surgical History:  Procedure Laterality Date   APPENDECTOMY     LAPAROSCOPY N/A 03/14/2017   Procedure: LAPAROSCOPY OPERATIVE;  Surgeon: BeMellody DrownMD;  Location: [facility];  Service: Gynecology;  Laterality: N/A;   LAPAROTOMY N/A 03/14/2017   Procedure: LAPAROTOMY;  Surgeon: BeMellody DrownMD;  Location: [facility];  Service: Gynecology;  Laterality: N/A;   LYMPH NODE DISSECTION N/A 03/14/2017   Procedure: LYMPH NODE DISSECTION;  Surgeon: BeMellody DrownMD;  Location: [facility];  Service: Gynecology;  Laterality: N/A;   OMENTECTOMY N/A 03/14/2017   Procedure: OMENTECTOMY;  Surgeon: BeMellody DrownMD;  Location: [facility];  Service: Gynecology;  Laterality: N/A;   PORTA CATH INSERTION N/A 11/27/2016   Procedure: PoGlori Luisath Insertion;  Surgeon: DeAlgernon Huxley  MD;  Location: [facility];  Service: Cardiovascular;  Laterality: N/A;    SOCIAL HISTORY: Social History   Tobacco Use   Smoking status: Never   Smokeless tobacco: Never  Vaping Use   Vaping Use: Never used  Substance Use Topics   Alcohol use: Not Currently   Drug use: No     FAMILY HISTORY Family History  Problem Relation Age of Onset   Throat cancer Cousin    Throat cancer Cousin    Leukemia Cousin     ALLERGIES:  is allergic to omeprazole.  MEDICATIONS:  Current Outpatient Medications  Medication Sig Dispense Refill   doxycycline (VIBRA-TABS) 100 MG tablet Take 1 tablet (100 mg total) by mouth daily. 5 tablet 0   furosemide (LASIX) 20 MG tablet Take 1 tablet (20 mg total) by mouth daily as needed for edema. 30 tablet 0   furosemide (LASIX) 20 MG tablet Take 1 tablet (20 mg total) by mouth daily as needed. 7 tablet 0   levothyroxine (SYNTHROID) 100 MCG tablet Take 1 tablet (100 mcg total) by mouth daily. 90 tablet 3   lidocaine-prilocaine (EMLA) cream Apply 1 application topically as needed. Apply small amount to port site at least 1 hour prior to it being accessed, cover with plastic  wrap 30 g 1   losartan (COZAAR) 50 MG tablet Take 1 tablet (50 mg total) by mouth daily. 30 tablet 0   ondansetron (ZOFRAN) 8 MG tablet Take 1 tablet (8 mg total) by mouth every 8 (eight) hours as needed for nausea or vomiting. 60 tablet 1   No current facility-administered medications for this visit.   Facility-Administered Medications Ordered in Other Visits  Medication Dose Route Frequency Provider Last Rate Last Admin   pegfilgrastim (NEULASTA ONPRO KIT) 6 MG/0.6ML injection             PHYSICAL EXAMINATION:  ECOG PERFORMANCE STATUS: 0 - Asymptomatic Vitals:   12/16/21 0926  BP: (!) 170/83  Pulse: 79  Resp: 20  Temp: 98.7 F (37.1 C)  SpO2: 100%    Filed Weights   12/16/21 0926  Weight: 116 lb 12.8 oz (53 kg)     Physical Exam Constitutional:      General: She is not in acute distress.    Appearance: She is not diaphoretic.     Comments: Thin built, she walks independantly  HENT:     Head: Normocephalic and atraumatic.     Nose: Nose normal.     Mouth/Throat:     Pharynx: No oropharyngeal exudate.  Eyes:     General: No scleral icterus.    Pupils: Pupils are equal, round, and reactive to light.  Neck:     Vascular: No JVD.  Cardiovascular:     Rate and Rhythm: Normal rate and regular rhythm.     Heart sounds: No murmur heard. Pulmonary:     Effort: Pulmonary effort is normal. No respiratory distress.     Breath sounds: Normal breath sounds. No rales.  Chest:     Chest wall: No tenderness.  Abdominal:     General: Bowel sounds are normal. There is no distension.     Palpations: Abdomen is soft.     Tenderness: There is no abdominal tenderness.  Musculoskeletal:        General: Normal range of motion.     Cervical back: Normal range of motion and neck supple.  Lymphadenopathy:     Cervical: No cervical adenopathy.  Skin:  General: Skin is warm and dry.     Findings: No erythema or rash.     Comments: Right anterior medi port +   Neurological:      Mental Status: She is alert and oriented to person, place, and time.     Cranial Nerves: No cranial nerve deficit.     Motor: No abnormal muscle tone.     Coordination: Coordination normal.  Psychiatric:        Mood and Affect: Affect normal.        Judgment: Judgment normal.        LABORATORY DATA: I have personally reviewed the data as listed:     Latest Ref Rng & Units 12/16/2021    9:10 AM 11/18/2021   10:12 AM 10/27/2021   10:12 AM  CBC  WBC 4.0 - 10.5 K/uL 4.7  4.1  14.2   Hemoglobin 12.0 - 15.0 g/dL 7.5  8.5  8.0   Hematocrit 36.0 - 46.0 % 23.1  25.4  24.9   Platelets 150 - 400 K/uL 108  107  111       Latest Ref Rng & Units 12/16/2021    9:10 AM 11/18/2021   10:12 AM 10/21/2021   12:39 PM  CMP  Glucose 70 - 99 mg/dL 101  93  90   BUN 8 - 23 mg/dL '23  18  22   ' Creatinine 0.44 - 1.00 mg/dL 1.05  0.83  0.99   Sodium 135 - 145 mmol/L 134  134  133   Potassium 3.5 - 5.1 mmol/L 3.9  4.4  3.7   Chloride 98 - 111 mmol/L 109  103  105   CO2 22 - 32 mmol/L '21  22  22   ' Calcium 8.9 - 10.3 mg/dL 9.2  9.3  9.2   Total Protein 6.5 - 8.1 g/dL 6.3  6.6  6.8   Total Bilirubin 0.3 - 1.2 mg/dL 0.5  0.4  1.0   Alkaline Phos 38 - 126 U/L 59  61  56   AST 15 - 41 U/L '22  17  18   ' ALT 0 - 44 U/L '12  9  9      ' RADIOGRAPHIC STUDIES: I have personally reviewed the radiological images as listed and agreed with the findings in the report. US Venous Img Lower Bilateral  Result Date: 12/16/2021 CLINICAL DATA:  Bilateral leg swelling. EXAM: BILATERAL LOWER EXTREMITY VENOUS DOPPLER ULTRASOUND TECHNIQUE: Gray-scale sonography with compression, as well as color and duplex ultrasound, were performed to evaluate the deep venous system(s) from the level of the common femoral vein through the popliteal and proximal calf veins. COMPARISON:  None Available. FINDINGS: VENOUS Normal compressibility of the common femoral, superficial femoral, and popliteal veins, as well as the visualized calf veins.  Visualized portions of profunda femoral vein and great saphenous vein unremarkable. No filling defects to suggest DVT on grayscale or color Doppler imaging. Doppler waveforms show normal direction of venous flow, normal respiratory plasticity and response to augmentation. OTHER 1.9 x 0.8 x 0.6 cm fluid collection left popliteal fossa suggest Baker's cyst. Limitations: none IMPRESSION: 1. No evidence of deep venous thrombosis in either lower extremity. 2. 1.9 cm left popliteal fossa Baker's cyst. Electronically Signed   By: Misty Stanley M.D.   On: 12/16/2021 11:48   VAS US CAROTID  Result Date: 11/18/2021 Carotid Arterial Duplex Study Patient Name:  Katie Hill  Date of Exam:   11/15/2021 Medical Rec #: [id number]  Accession #:    [id number] Date of Birth: 1951-02-11         Patient Gender: F Patient Age:   77 years Exam Location:  [facility] Procedure:      VAS US CAROTID Referring Phys: Eulogio Ditch --------------------------------------------------------------------------------  Indications: Left bruit. Performing Technologist: Almira Coaster RVS  Examination Guidelines: A complete evaluation includes B-mode imaging, spectral Doppler, color Doppler, and power Doppler as needed of all accessible portions of each vessel. Bilateral testing is considered an integral part of a complete examination. Limited examinations for reoccurring indications may be performed as noted.  Right Carotid Findings: +----------+--------+--------+--------+------------------+--------+           PSV cm/sEDV cm/sStenosisPlaque DescriptionComments +----------+--------+--------+--------+------------------+--------+ CCA Prox  96      12                                         +----------+--------+--------+--------+------------------+--------+ CCA Mid   72      15                                         +----------+--------+--------+--------+------------------+--------+ CCA Distal53      13                                          +----------+--------+--------+--------+------------------+--------+ ICA Prox  52      12                                         +----------+--------+--------+--------+------------------+--------+ ICA Mid   99      32                                         +----------+--------+--------+--------+------------------+--------+ ICA Distal95      29                                         +----------+--------+--------+--------+------------------+--------+ ECA       58      0                                          +----------+--------+--------+--------+------------------+--------+ +----------+--------+-------+--------+-------------------+           PSV cm/sEDV cmsDescribeArm Pressure (mmHG) +----------+--------+-------+--------+-------------------+ [id number]      0                                  +----------+--------+-------+--------+-------------------+ +---------+--------+--+--------+--+ VertebralPSV cm/s50EDV cm/s13 +---------+--------+--+--------+--+  Left Carotid Findings: +----------+--------+--------+--------+------------------+--------+           PSV cm/sEDV cm/sStenosisPlaque DescriptionComments +----------+--------+--------+--------+------------------+--------+ CCA Prox  83      18                                         +----------+--------+--------+--------+------------------+--------+  CCA Mid   92      22                                         +----------+--------+--------+--------+------------------+--------+ CCA Distal70      21                                         +----------+--------+--------+--------+------------------+--------+ ICA Prox  37      13                                         +----------+--------+--------+--------+------------------+--------+ ICA Mid   83      22                                          +----------+--------+--------+--------+------------------+--------+ ICA Distal99      21                                         +----------+--------+--------+--------+------------------+--------+ ECA       55      7                                          +----------+--------+--------+--------+------------------+--------+ +----------+--------+--------+--------+-------------------+           PSV cm/sEDV cm/sDescribeArm Pressure (mmHG) +----------+--------+--------+--------+-------------------+ Subclavian101     0                                   +----------+--------+--------+--------+-------------------+ +---------+--------+--+--------+--+ VertebralPSV cm/s44EDV cm/s13 +---------+--------+--+--------+--+   Summary: Right Carotid: Velocities in the right ICA are consistent with a 1-39% stenosis. Left Carotid: Velocities in the left ICA are consistent with a 1-39% stenosis. Vertebrals: Bilateral vertebral arteries demonstrate antegrade flow. *See table(s) above for measurements and observations.  Electronically signed by Leotis Pain MD on 11/18/2021 at 9:46:40 AM.    Final    ECHOCARDIOGRAM COMPLETE  Result Date: 10/14/2021    ECHOCARDIOGRAM REPORT   Patient Name:   Katie Hill Date of Exam: 10/14/2021 Medical Rec #:  [id number]        Height:       67.0 in Accession #:    [id number]       Weight:       113.0 lb Date of Birth:  1951-02-29        BSA:          1.587 m Patient Age:    26 years         BP:           158/92 mmHg Patient Gender: F                HR:           76 bpm. Exam Location:  [facility] Procedure: 2D Echo, Cardiac Doppler and Color Doppler Indications:  Z09, Cardiotoxic drug therapy  History:        Patient has prior history of Echocardiogram examinations, most                 recent 01/14/2021. Risk Factors:Hypertension and Non-Smoker.                 Currently receiving chemotherapy.  Sonographer:    Pilar Jarvis RDMS, RVT, RDCS Referring Phys: [id number] [facility] Josephus Harriger   Sonographer Comments: Global longitudinal strain was attempted. Very limited acoustic windows IMPRESSIONS  1. Left ventricular ejection fraction, by estimation, is 55 to 60%. The left ventricle has normal function. The left ventricle has no regional wall motion abnormalities. Left ventricular diastolic parameters were normal. The average left ventricular global longitudinal strain is -13.8 %.  2. Right ventricular systolic function is normal. The right ventricular size is normal. There is normal pulmonary artery systolic pressure. The estimated right ventricular systolic pressure is 10.9 mmHg.  3. The mitral valve is normal in structure. Mild mitral valve regurgitation. No evidence of mitral stenosis.  4. The aortic valve is normal in structure. Aortic valve regurgitation is mild. No aortic stenosis is present.  5. The inferior vena cava is normal in size with greater than 50% respiratory variability, suggesting right atrial pressure of 3 mmHg. FINDINGS  Left Ventricle: Left ventricular ejection fraction, by estimation, is 55 to 60%. The left ventricle has normal function. The left ventricle has no regional wall motion abnormalities. The average left ventricular global longitudinal strain is -13.8 %. The left ventricular internal cavity size was normal in size. There is no left ventricular hypertrophy. Left ventricular diastolic parameters were normal. Right Ventricle: The right ventricular size is normal. No increase in right ventricular wall thickness. Right ventricular systolic function is normal. There is normal pulmonary artery systolic pressure. The tricuspid regurgitant velocity is 2.38 m/s, and  with an assumed right atrial pressure of 5 mmHg, the estimated right ventricular systolic pressure is 32.3 mmHg. Left Atrium: Left atrial size was normal in size. Right Atrium: Right atrial size was normal in size. Pericardium: There is no evidence of pericardial effusion. Mitral Valve: The mitral valve is normal in  structure. Mild mitral valve regurgitation. No evidence of mitral valve stenosis. Tricuspid Valve: The tricuspid valve is normal in structure. Tricuspid valve regurgitation is mild . No evidence of tricuspid stenosis. Aortic Valve: The aortic valve is normal in structure. Aortic valve regurgitation is mild. No aortic stenosis is present. Aortic valve mean gradient measures 2.0 mmHg. Aortic valve peak gradient measures 4.1 mmHg. Aortic valve area, by VTI measures 3.06 cm. Pulmonic Valve: The pulmonic valve was normal in structure. Pulmonic valve regurgitation is not visualized. No evidence of pulmonic stenosis. Aorta: The aortic root is normal in size and structure. Venous: The inferior vena cava is normal in size with greater than 50% respiratory variability, suggesting right atrial pressure of 3 mmHg. IAS/Shunts: No atrial level shunt detected by color flow Doppler.  LEFT VENTRICLE PLAX 2D LVIDd:         5.00 cm     Diastology LVIDs:         3.60 cm     LV e' medial:    7.83 cm/s LV PW:         1.10 cm     LV E/e' medial:  10.9 LV IVS:        1.00 cm     LV e' lateral:   8.05 cm/s LVOT diam:  2.10 cm     LV E/e' lateral: 10.6 LV SV:         63 LV SV Index:   40          2D Longitudinal Strain LVOT Area:     3.46 cm    2D Strain GLS Avg:     -13.8 %  LV Volumes (MOD) LV vol d, MOD A2C: 98.3 ml LV vol d, MOD A4C: 69.1 ml LV vol s, MOD A2C: 43.5 ml LV vol s, MOD A4C: 32.8 ml LV SV MOD A2C:     54.8 ml LV SV MOD A4C:     69.1 ml LV SV MOD BP:      45.3 ml RIGHT VENTRICLE             IVC RV S prime:     12.20 cm/s  IVC diam: 1.80 cm RVOT diam:      2.55 cm TAPSE (M-mode): 2.6 cm LEFT ATRIUM             Index        RIGHT ATRIUM           Index LA diam:        3.60 cm 2.27 cm/m   RA Area:     20.20 cm LA Vol (A2C):   80.5 ml 50.74 ml/m  RA Volume:   62.10 ml  39.14 ml/m LA Vol (A4C):   31.7 ml 19.98 ml/m LA Biplane Vol: 52.2 ml 32.90 ml/m  AORTIC VALVE                    PULMONIC VALVE AV Area (Vmax):    3.36  cm     PV Vmax:       0.72 m/s AV Area (Vmean):   2.87 cm     PV Peak grad:  2.1 mmHg AV Area (VTI):     3.06 cm AV Vmax:           101.00 cm/s AV Vmean:          74.900 cm/s AV VTI:            0.205 m AV Peak Grad:      4.1 mmHg AV Mean Grad:      2.0 mmHg LVOT Vmax:         97.90 cm/s LVOT Vmean:        62.100 cm/s LVOT VTI:          0.181 m LVOT/AV VTI ratio: 0.88 AR Vena Contracta: 0.20 cm  AORTA Ao Root diam: 3.30 cm Ao Asc diam:  3.20 cm Ao Arch diam: 2.5 cm MITRAL VALVE               TRICUSPID VALVE MV Area (PHT): 4.99 cm    TR Peak grad:   22.7 mmHg MV Decel Time: 152 msec    TR Vmax:        238.00 cm/s MV E velocity: 85.70 cm/s MV A velocity: 58.30 cm/s  SHUNTS MV E/A ratio:  1.47        Systemic VTI:  0.18 m                            Systemic Diam: 2.10 cm                            Pulmonic Diam: 2.55 cm Christia Reading  Rockey Situ MD Electronically signed by Ida Rogue MD Signature Date/Time: 10/14/2021/4:45:02 PM    Final    CT CHEST ABDOMEN PELVIS W CONTRAST  Result Date: 09/20/2021 CLINICAL DATA:  Ovarian cancer restaging.  05/16/2021 EXAM: CT CHEST, ABDOMEN, AND PELVIS WITH CONTRAST TECHNIQUE: Multidetector CT imaging of the chest, abdomen and pelvis was performed following the standard protocol during bolus administration of intravenous contrast. RADIATION DOSE REDUCTION: This exam was performed according to the departmental dose-optimization program which includes automated exposure control, adjustment of the mA and/or kV according to patient size and/or use of iterative reconstruction technique. CONTRAST:  159m OMNIPAQUE IOHEXOL 300 MG/ML  SOLN COMPARISON:  05/16/2021 FINDINGS: CT CHEST FINDINGS Cardiovascular: Mild cardiac enlargement. No pericardial effusion. Mild aortic atherosclerosis. Mediastinum/Nodes: No enlarged supraclavicular, axillary, mediastinal, or hilar lymph nodes. Trachea, thyroid gland and esophagus demonstrate no significant findings. Lungs/Pleura: No pleural effusions. Stable 6  mm left lower lobe lung nodule, image 87/5. No new or enlarging lung nodules. Musculoskeletal: No chest wall mass or suspicious bone lesions identified. CT ABDOMEN PELVIS FINDINGS Hepatobiliary: No focal liver abnormality is seen. No gallstones, gallbladder wall thickening, or biliary dilatation. Pancreas: Unremarkable. No pancreatic ductal dilatation or surrounding inflammatory changes. Spleen: Normal in size without focal abnormality. Adrenals/Urinary Tract: Normal adrenal glands. No kidney mass or hydronephrosis. Unchanged bilateral too small to reliably characterize low-density kidney lesions compatible with Bosniak class 2 cysts. No follow-up recommended. No nephrolithiasis or hydronephrosis. Urinary bladder appears normal. Stomach/Bowel: Small hiatal hernia. No bowel wall thickening, inflammation, or distension. Vascular/Lymphatic: Aortic atherosclerosis.  No aneurysm. The previous index left periaortic retroperitoneal lymph node measures 1.1 cm, image 65/2. Previously 1.3 cm. 1 cm aortocaval lymph node is unchanged, image 72/2. No enlarged pelvic or inguinal lymph nodes. Reproductive: Status post hysterectomy. No adnexal masses. Other: Status post omentectomy. Previously referenced low-density lesion within the right posterior pelvis measures 3.0 x 1.4 cm, image 106/2. Previously 3.3 x 1.4 cm. No significant ascites. Musculoskeletal: No acute or significant osseous findings. IMPRESSION: 1. No significant interval changed compared with the previous exam. 2. Stable appearance of soft tissue attenuating lesion in the posterolateral right hemipelvis. This may represent a treated site of peritoneal disease. 3. Unchanged appearance of borderline enlarged left periaortic and aortocaval retroperitoneal lymph nodes. 4. Unchanged 6 mm left lower lobe lung nodule. 5. No new signs of metastatic disease within the chest, abdomen or pelvis. 6. Aortic Atherosclerosis (ICD10-I70.0) and Emphysema (ICD10-J43.9). * Tracking  Code: BO * Electronically Signed   By: TKerby MoorsM.D.   On: 09/20/2021 08:18

## 2021-12-16 NOTE — Assessment & Plan Note (Addendum)
discontinue amlodipine 5 mg daily  Increase losartan to 50 mg daily. Add lasix '20mg'$  daily PRN

## 2021-12-16 NOTE — Assessment & Plan Note (Signed)
Continue hold bevacizumab.  Monitor monthly.   Refer to nephrologist

## 2021-12-16 NOTE — Assessment & Plan Note (Addendum)
Secondary to chemotherapy.   She is relatively asymptomatic. Chemotherapy is held.  Monitor levels.

## 2021-12-16 NOTE — Telephone Encounter (Signed)
Called and informed patient that blood transfusion is not needed on 9/11. Transfusion has been cancelled.

## 2021-12-16 NOTE — Assessment & Plan Note (Addendum)
STAT US lower extremities bilaterally - negative.  Repeat Echo.  Lasix '20mg'$  daily PRN edema.  ? Cellulitis vs vein insufficiency dermatitis I will cover with doxycycline '100mg'$  daily x 5 days

## 2021-12-16 NOTE — Assessment & Plan Note (Addendum)
Labs reviewed and discussed with patient. Hold treatment Repeat CT in Sept 2023

## 2021-12-17 ENCOUNTER — Other Ambulatory Visit: Payer: Self-pay | Admitting: *Deleted

## 2021-12-17 DIAGNOSIS — Z1231 Encounter for screening mammogram for malignant neoplasm of breast: Secondary | ICD-10-CM

## 2021-12-18 LAB — CA 125: Cancer Antigen (CA) 125: 26.8 U/mL (ref 0.0–38.1)

## 2021-12-19 ENCOUNTER — Inpatient Hospital Stay: Payer: Medicare Other

## 2021-12-20 ENCOUNTER — Telehealth: Payer: Self-pay

## 2021-12-20 NOTE — Telephone Encounter (Signed)
Patient informed of [place] results last week. Per Dr. Tasia Catchings: "Echo is scheduled on 9/15, recommend her to follow up on 9/22 lab MD Doxil. Chemo IS has been updated"  Please schedule appt and inform pt of appt details.

## 2021-12-20 NOTE — Telephone Encounter (Signed)
-----   Message from Earlie Server, MD sent at 12/16/2021  6:27 PM EDT ----- No DVT. Echo is scheduled on 9/15, recommend her to follow up on 9/22 lab MD Doxil. Chemo IS has been updated. Thanks.

## 2021-12-21 ENCOUNTER — Encounter: Payer: Self-pay | Admitting: Oncology

## 2021-12-21 ENCOUNTER — Other Ambulatory Visit: Payer: Self-pay | Admitting: Oncology

## 2021-12-23 ENCOUNTER — Ambulatory Visit
Admission: RE | Admit: 2021-12-23 | Discharge: 2021-12-23 | Disposition: A | Discharge status: Home / Self Care | Payer: Medicare Other | Source: Ambulatory Visit | Attending: Oncology | Admitting: Oncology

## 2021-12-23 DIAGNOSIS — I10 Essential (primary) hypertension: Secondary | ICD-10-CM | POA: Diagnosis not present

## 2021-12-23 DIAGNOSIS — Z79899 Other long term (current) drug therapy: Secondary | ICD-10-CM | POA: Insufficient documentation

## 2021-12-23 DIAGNOSIS — I081 Rheumatic disorders of both mitral and tricuspid valves: Secondary | ICD-10-CM | POA: Diagnosis not present

## 2021-12-23 DIAGNOSIS — Z5181 Encounter for therapeutic drug level monitoring: Secondary | ICD-10-CM | POA: Insufficient documentation

## 2021-12-23 DIAGNOSIS — M7989 Other specified soft tissue disorders: Secondary | ICD-10-CM | POA: Insufficient documentation

## 2021-12-23 LAB — ECHOCARDIOGRAM COMPLETE
AR max vel: 2.32 cm2
AV Area VTI: 2.38 cm2
AV Area mean vel: 2.24 cm2
AV Mean grad: 2 mmHg
AV Peak grad: 4.3 mmHg
Ao pk vel: 1.04 m/s
Area-P 1/2: 4.1 cm2
S' Lateral: 3.75 cm

## 2021-12-23 NOTE — Progress Notes (Signed)
*  PRELIMINARY RESULTS* Echocardiogram 2D Echocardiogram has been performed.  Katie Hill 12/23/2021, 9:43 AM

## 2021-12-26 ENCOUNTER — Ambulatory Visit
Admission: RE | Admit: 2021-12-26 | Discharge: 2021-12-26 | Disposition: A | Discharge status: Home / Self Care | Payer: Medicare Other | Source: Ambulatory Visit | Attending: Oncology | Admitting: Oncology

## 2021-12-26 DIAGNOSIS — J929 Pleural plaque without asbestos: Secondary | ICD-10-CM | POA: Diagnosis not present

## 2021-12-26 DIAGNOSIS — C569 Malignant neoplasm of unspecified ovary: Secondary | ICD-10-CM | POA: Insufficient documentation

## 2021-12-26 DIAGNOSIS — R918 Other nonspecific abnormal finding of lung field: Secondary | ICD-10-CM | POA: Diagnosis not present

## 2021-12-26 DIAGNOSIS — J90 Pleural effusion, not elsewhere classified: Secondary | ICD-10-CM | POA: Diagnosis not present

## 2021-12-26 DIAGNOSIS — M7989 Other specified soft tissue disorders: Secondary | ICD-10-CM | POA: Diagnosis not present

## 2021-12-26 DIAGNOSIS — R599 Enlarged lymph nodes, unspecified: Secondary | ICD-10-CM | POA: Diagnosis not present

## 2021-12-26 DIAGNOSIS — C563 Malignant neoplasm of bilateral ovaries: Secondary | ICD-10-CM | POA: Insufficient documentation

## 2021-12-26 DIAGNOSIS — R1909 Other intra-abdominal and pelvic swelling, mass and lump: Secondary | ICD-10-CM | POA: Diagnosis not present

## 2021-12-26 MED ORDER — IOHEXOL 300 MG/ML  SOLN
100.0000 mL | Freq: Once | INTRAMUSCULAR | Status: AC | PRN
  Administered 2021-12-26: 100 mL via INTRAVENOUS

## 2021-12-29 MED FILL — Dexamethasone Sodium Phosphate Inj 100 MG/10ML: INTRAMUSCULAR | Qty: 1 | Status: AC

## 2021-12-30 ENCOUNTER — Encounter: Payer: Self-pay | Admitting: Oncology

## 2021-12-30 ENCOUNTER — Inpatient Hospital Stay: Payer: Medicare Other

## 2021-12-30 ENCOUNTER — Inpatient Hospital Stay (HOSPITAL_BASED_OUTPATIENT_CLINIC_OR_DEPARTMENT_OTHER): Payer: Medicare Other | Admitting: Oncology

## 2021-12-30 ENCOUNTER — Other Ambulatory Visit: Payer: Self-pay

## 2021-12-30 VITALS — BP 189/96 | HR 88 | Temp 96.6°F | Resp 20 | Wt 120.7 lb

## 2021-12-30 DIAGNOSIS — E039 Hypothyroidism, unspecified: Secondary | ICD-10-CM | POA: Diagnosis not present

## 2021-12-30 DIAGNOSIS — R808 Other proteinuria: Secondary | ICD-10-CM | POA: Diagnosis not present

## 2021-12-30 DIAGNOSIS — C569 Malignant neoplasm of unspecified ovary: Secondary | ICD-10-CM

## 2021-12-30 DIAGNOSIS — D6481 Anemia due to antineoplastic chemotherapy: Secondary | ICD-10-CM

## 2021-12-30 DIAGNOSIS — I10 Essential (primary) hypertension: Secondary | ICD-10-CM | POA: Diagnosis not present

## 2021-12-30 DIAGNOSIS — J90 Pleural effusion, not elsewhere classified: Secondary | ICD-10-CM | POA: Diagnosis not present

## 2021-12-30 DIAGNOSIS — T451X5A Adverse effect of antineoplastic and immunosuppressive drugs, initial encounter: Secondary | ICD-10-CM

## 2021-12-30 DIAGNOSIS — C561 Malignant neoplasm of right ovary: Secondary | ICD-10-CM | POA: Diagnosis not present

## 2021-12-30 DIAGNOSIS — R809 Proteinuria, unspecified: Secondary | ICD-10-CM | POA: Diagnosis not present

## 2021-12-30 LAB — COMPREHENSIVE METABOLIC PANEL
ALT: 11 U/L (ref 0–44)
AST: 22 U/L (ref 15–41)
Albumin: 3.7 g/dL (ref 3.5–5.0)
Alkaline Phosphatase: 60 U/L (ref 38–126)
Anion gap: 3 — ABNORMAL LOW (ref 5–15)
BUN: 18 mg/dL (ref 8–23)
CO2: 21 mmol/L — ABNORMAL LOW (ref 22–32)
Calcium: 9.1 mg/dL (ref 8.9–10.3)
Chloride: 110 mmol/L (ref 98–111)
Creatinine, Ser: 0.97 mg/dL (ref 0.44–1.00)
GFR, Estimated: >60 mL/min (ref >60)
Glucose, Bld: 112 mg/dL — ABNORMAL HIGH (ref 70–99)
Potassium: 3.6 mmol/L (ref 3.5–5.1)
Sodium: 134 mmol/L — ABNORMAL LOW (ref 135–145)
Total Bilirubin: 0.6 mg/dL (ref 0.3–1.2)
Total Protein: 6.5 g/dL (ref 6.5–8.1)

## 2021-12-30 LAB — CBC WITH DIFFERENTIAL/PLATELET
Abs Immature Granulocytes: 0.01 10*3/uL (ref 0.00–0.07)
Basophils Absolute: 0 10*3/uL (ref 0.0–0.1)
Basophils Relative: 1 %
Eosinophils Absolute: 0.2 10*3/uL (ref 0.0–0.5)
Eosinophils Relative: 4 %
HCT: 23.9 % — ABNORMAL LOW (ref 36.0–46.0)
Hemoglobin: 7.9 g/dL — ABNORMAL LOW (ref 12.0–15.0)
Immature Granulocytes: 0 %
Lymphocytes Relative: 20 %
Lymphs Abs: 0.7 10*3/uL (ref 0.7–4.0)
MCH: 35.1 pg — ABNORMAL HIGH (ref 26.0–34.0)
MCHC: 33.1 g/dL (ref 30.0–36.0)
MCV: 106.2 fL — ABNORMAL HIGH (ref 80.0–100.0)
Monocytes Absolute: 0.4 10*3/uL (ref 0.1–1.0)
Monocytes Relative: 11 %
Neutro Abs: 2.5 10*3/uL (ref 1.7–7.7)
Neutrophils Relative %: 64 %
Platelets: 127 10*3/uL — ABNORMAL LOW (ref 150–400)
RBC: 2.25 MIL/uL — ABNORMAL LOW (ref 3.87–5.11)
RDW: 15.7 % — ABNORMAL HIGH (ref 11.5–15.5)
WBC: 3.8 10*3/uL — ABNORMAL LOW (ref 4.0–10.5)
nRBC: 0 % (ref 0.0–0.2)

## 2021-12-30 LAB — IRON AND TIBC
Iron: 58 ug/dL (ref 28–170)
Saturation Ratios: 19 % (ref 10.4–31.8)
TIBC: 311 ug/dL (ref 250–450)
UIBC: 253 ug/dL

## 2021-12-30 LAB — RETIC PANEL
Immature Retic Fract: 13.5 % (ref 2.3–15.9)
RBC.: 2.25 MIL/uL — ABNORMAL LOW (ref 3.87–5.11)
Retic Count, Absolute: 72.2 10*3/uL (ref 19.0–186.0)
Retic Ct Pct: 3.2 % — ABNORMAL HIGH (ref 0.4–3.1)
Reticulocyte Hemoglobin: 35.8 pg (ref >27.9)

## 2021-12-30 LAB — FERRITIN: Ferritin: 306 ng/mL (ref 11–307)

## 2021-12-30 LAB — TSH: TSH: 22.325 u[IU]/mL — ABNORMAL HIGH (ref 0.350–4.500)

## 2021-12-30 LAB — TECHNOLOGIST SMEAR REVIEW
Plt Morphology: NORMAL
RBC MORPHOLOGY: NORMAL
WBC MORPHOLOGY: NORMAL

## 2021-12-30 LAB — PROTEIN, URINE, RANDOM: Total Protein, Urine: 182 mg/dL

## 2021-12-30 MED ORDER — HEPARIN SOD (PORK) LOCK FLUSH 100 UNIT/ML IV SOLN
500.0000 [IU] | Freq: Once | INTRAVENOUS | Status: AC
  Administered 2021-12-30: 500 [IU] via INTRAVENOUS
  Filled 2021-12-30: qty 5

## 2021-12-30 MED ORDER — FUROSEMIDE 40 MG PO TABS: 40.0000 mg | ORAL_TABLET | Freq: Every day | ORAL | 0 refills | Status: DC

## 2021-12-30 MED ORDER — FUROSEMIDE 40 MG PO TABS: 40.0000 mg | ORAL_TABLET | Freq: Every day | ORAL | 1 refills | Status: DC

## 2021-12-30 MED ORDER — METOPROLOL TARTRATE 25 MG PO TABS: 25.0000 mg | ORAL_TABLET | Freq: Two times a day (BID) | ORAL | 0 refills | Status: DC

## 2021-12-30 MED ORDER — METOPROLOL TARTRATE 25 MG PO TABS: 25.0000 mg | ORAL_TABLET | Freq: Two times a day (BID) | ORAL | 1 refills | Status: DC

## 2021-12-30 NOTE — Assessment & Plan Note (Addendum)
Labs reviewed and discussed with patient. Hold treatment due to uncontrolled hypertension. CT images were reviewed and discussed with patient. New RIGHT pleural effusion and new mild nodularity along the RIGHT oblique fissure CA125 is pending.  Echo showed normal LVEF.  Persistent lower extremity edema. Cancer progression vs other etiologies. Discussed about possible need of diagnostic thoracentesis.  She agrees with the plan

## 2021-12-30 NOTE — Assessment & Plan Note (Addendum)
Uncontrolled hypertention Due to bilateral lower extremity swelling, amlodipine was discontinued at the last visit.   Recommend patient to further increase losartan to '75mg'$  daily. Add metoprolol 25 mg daily.   Lasix to 40 mg daily. And to keep blood pressure log and update me next week.

## 2021-12-30 NOTE — Progress Notes (Signed)
Hematology/Oncology Progress note [phone] [phone]         Patient Care Team: Cletis Athens, MD as PCP - General (Internal Medicine) Clent Jacks, [name], [name], [name], MD as Referring Physician (Obstetrics and Gynecology)  ASSESSMENT & PLAN:   Cancer Staging  Malignant neoplasm of ovary [facility]) Staging form: Ovary, Fallopian Tube, and Primary Peritoneal Carcinoma, AJCC 8th Edition - Clinical stage from 11/22/2016: Stage IIIC (cT3c, cN1b, cM0) - Signed by Earlie Server, MD on 11/22/2016   Malignant neoplasm of ovary [facility]) Labs reviewed and discussed with patient. Hold treatment due to uncontrolled hypertension. CT images were reviewed and discussed with patient. New RIGHT pleural effusion and new mild nodularity along the RIGHT oblique fissure CA125 is pending.  Echo showed normal LVEF.  Persistent lower extremity edema. Cancer progression vs other etiologies. Discussed about possible need of diagnostic thoracentesis.  She agrees with the plan   Anemia due to antineoplastic chemotherapy Patient has been off chemotherapy for 6 weeks. She is relatively asymptomatic. Chemotherapy is held.  Close monitor.  There may be an effect of hemo dilution due to edema.  Proteinuria Continue hold bevacizumab.  I will check a 24-hour urine testing. Refer to nephrologist  Hypertension Uncontrolled hypertention Due to bilateral lower extremity swelling, amlodipine was discontinued at the last visit.   Recommend patient to further increase losartan to $RemoveBef'75mg'IPKTlcEtis$  daily. Add metoprolol 25 mg daily.   Lasix to 40 mg daily. And to keep blood pressure log and update me next week.    Orders Placed This Encounter  Procedures   Protein, urine, 24 hour    Standing Status:   Future    Standing Expiration Date:   12/31/2022   CA 125     Standing Status:   Future    Standing Expiration Date:   01/14/2023   CBC with Differential    Standing Status:   Future    Standing Expiration Date:   01/14/2023   Comprehensive metabolic panel    Standing Status:   Future    Standing Expiration Date:   01/14/2023   Follow-up  Lab NP 1 week -BP check Lab MD Doxil 2 weeks All questions were answered. The patient knows to call the clinic with any problems, questions or concerns.  Earlie Server, MD, PhD [facility] Hematology Oncology 12/30/2021     CHIEF COMPLAINTS/PURPOSE OF Visit Follow up for chemotherapy for ovarian cancer.  HISTORY OF PRESENTING ILLNESS: Katie Hill 71 y.o. female presents for follow up of management of recurrent  ovarian cancer. Oncology History  Malignant neoplasm of ovary ([place])  09/20/2011 Imaging   CT chest abdomen pelvis w contrast 1. No significant interval changed compared with the previous exam.2. Stable appearance of soft tissue attenuating lesion in the posterolateral right hemipelvis. This may represent a treated site of peritoneal disease.3. Unchanged appearance of borderline enlarged left periaortic and aortocaval retroperitoneal lymph nodes.4. Unchanged 6 mm left lower lobe lung nodule. 5. No new signs of metastatic disease within the chest, abdomen or pelvis. 6. Aortic Atherosclerosis (ICD10-I70.0) and Emphysema    11/20/2016 Initial Diagnosis   Malignant neoplasm of ovary   Pathology 11/16/2016 Surgical Pathology  CASE: [id number]  PATIENT: Katie Hill  Surgical Pathology Report   SPECIMEN SUBMITTED:  A. Retroperitoneal adenopathy, left  DIAGNOSIS:  A. LYMPH NODE, LEFT RETROPERITONEAL; CT-GUIDED CORE BIOPSY:  - METASTATIC HIGH-GRADE SEROUS  CARCINOMA.  Pathology 03/14/2017   DIAGNOSIS:  A. OMENTUM; OMENTECTOMY:  - NO TUMOR SEEN.  - ONE NEGATIVE LYMPH NODE (0/1).   B.  RIGHT FALLOPIAN TUBE AND OVARY; SALPINGO-OOPHORECTOMY:  - SMALL FOCI OF HIGH GRADE SEROUS CARCINOMA  INVOLVING THE OVARY.  - MARKED THERAPY RELATED CHANGE.  - NO TUMOR SEEN IN THE FALLOPIAN TUBE.   C.  UTERUS, CERVIX, LEFT FALLOPIAN TUBE AND OVARY; HYSTERECTOMY AND LEFT  SALPINGO-OOPHORECTOMY:  - NABOTHIAN CYSTS.  - CYSTIC ATROPHY OF THE ENDOMETRIUM.  - SEROSAL ADHESIONS.  - UNREMARKABLE FALLOPIAN TUBE.  - OVARY SHOWING TREATMENT RELATED CHANGE.   D.  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    12/01/2016 -  Chemotherapy   Carboplatin and taxol x 4 neoadjuvant chemotherapy    03/14/2017 Surgery   Patient had debulking surgery on 03/14/2017. She had a laparoscopy with conversion to laparotomy, total hysterectomy, with bilateral salpingo oophorectomy, right aortic lymph node dissection, omentectomy. Pathology showed small foci of residual disease in ovary.   HRD positive, declined Olarparib maintenance trial    03/28/2017 -  Chemotherapy   Adjuvant carbo and taxol x3 cycles   02/28/2018 Progression   Local Recurrence. CA125 146 CT abdomen pelvis w contrast showed interval enlargement of an aortocaval lymph node which currently measures 1.4 cm in short axis (axial image 75 of series 2). This lymph node is in close proximity to the previously resected retroperitoneal lymphadenopathy and is very concerning for an additional nodal metastasis.    03/15/2018 -  Chemotherapy   03/15/2018-05/17/2018 Carboplatin and Taxol x 4.  CA125 decrease from 49.7 to 6 after 4 cycles of treatment.  05/17/2018-10/29/20   Olarparib $RemoveBe'300mg'bufouCBBK$  BID    11/05/2018 Imaging   MRI abdomenPelvis on 11/05/2018 showed Interval progression of, now measuring 2.7 x 2.3 cm and concerning for progression of metastatic disease.retrocaval lymph node in the abdomen Olaparib was held for short period of time. Her CA125 trended down again.  Dr. Fransisca Connors recommending resume olaparib and continue monitor.   10/06/2020 Progression    CT chest abdomen pelvis  with contrast showed new lymph nodes in the prevascular space of the upper anterior mediastinum most consistent with metastasis.  Single new right lower lobe pulmonary nodule is concerning for early pulmonary metastasis.  Interval enlargement of the left periaortic retroperitoneal lymph node.  Stable adjacent aorto caval adenopathy.    Genetic Testing   Genetic testing negative for 83 genes on [facility]'s Multi-Cancer panel (ALK, APC, ATM, AXIN2, BAP1, BARD1, BLM, BMPR1A, BRCA1, BRCA2, BRIP1, CASR, CDC73, CDH1, CDK4, CDKN1B, CDKN1C, CDKN2A, CEBPA, CHEK2, CTNNA1, DICER1, DIS3L2, EGFR, EPCAM, FH, FLCN, GATA2, GPC3, GREM1, HOXB13, HRAS, KIT, MAX, MEN1, MET, MITF, MLH1, MSH2, MSH3, MSH6, MUTYH, NBN, NF1, NF2, NTHL1, PALB2, PDGFRA, PHOX2B, PMS2, POLD1, POLE, POT1, PRKAR1A, PTCH1, PTEN, RAD50, RAD51C, RAD51D, RB1, RECQL4, RET, RUNX1, SDHA, SDHAF2, SDHB, SDHC, SDHD, SMAD4, SMARCA4, SMARCB1, SMARCE1, STK11, SUFU, TERC, TERT, TMEM127, TP53, TSC1, TSC2, VHL, WRN, WT1).   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  Myraid testing negative for somatic BRACA1/2, positive for HRD.    11/03/2020 -  Chemotherapy   Liposomal Doxorubicin + Bevacizumab q28d   11/03/20 -05/20/21 Doxil + Bevacizumab q28d  06/03/21 Doxil only due to proteinuria.  07/01/21-07/15/21  Resumed on Doxil + Bevacizumab 07/29/21 Doxil only due to proteinuria.    10/14/2021 Echocardiogram    Left ventricular ejection fraction, by estimation, is 55 to 60%   12/16/2021  Imaging   [place] lower extremities 1. No evidence of deep venous thrombosis in either lower extremity. 2. 1.9 cm left popliteal fossa Baker's cyst.   12/23/2021 Echocardiogram   LVEF 55%   12/26/2021 Imaging   CT chest abdomen pelvis w contrast   Chest Impression:   1. New RIGHT pleural effusion and new mild nodularity along the RIGHT oblique fissure. Recommend close attention on follow-up for potential pleural metastasis in the RIGHT hemithorax. 2.  Stable LEFT lobe pulmonary nodule.3. No mediastinal lymphadenopathy.   Abdomen / Pelvis Impression:   1. Stable soft tissue lesion in the deep RIGHT pelvis. 2. Mass stable small periaortic lymph nodes. 3. No intraperitoneal free fluid the abdomen or pelvis.   High grade ovarian cancer [facility])      Patient was accompanied by her husband.   She takes norvasc $RemoveBefor'5mg'uCCGARxeGhlW$  daily, losartan $RemoveBeforeD'25mg'zayoRiYULmVGRz$  daily She feels well. Chronic fatigue, manageable  . Review of Systems  Constitutional:  Positive for fatigue. Negative for appetite change, chills and fever.  HENT:   Negative for hearing loss and voice change.   Eyes:  Negative for eye problems.  Respiratory:  Negative for chest tightness and cough.   Cardiovascular:  Negative for chest pain and leg swelling.  Gastrointestinal:  Negative for abdominal distention, abdominal pain and blood in stool.  Endocrine: Negative for hot flashes.  Genitourinary:  Negative for difficulty urinating and frequency.   Musculoskeletal:  Negative for arthralgias.  Skin:  Negative for itching and rash.  Neurological:  Negative for extremity weakness.  Hematological:  Negative for adenopathy.  Psychiatric/Behavioral:  Negative for confusion.      [name] MEDICAL HISTORY: Past Medical History:  Diagnosis Date   Dysrhythmia    Genetic testing 03/28/2017   Multi-Cancer panel (83 genes) @ [facility] - No pathogenic mutations detected   High grade ovarian cancer ([place]) 11/20/2016   Pelvic mass in female     SURGICAL HISTORY: Past Surgical History:  Procedure Laterality Date   APPENDECTOMY     LAPAROSCOPY N/A 03/14/2017   Procedure: LAPAROSCOPY OPERATIVE;  Surgeon: Mellody Drown, MD;  Location: [facility] OR[facility];  Service: Gynecology;  Laterality: N/A;   LAPAROTOMY N/A 03/14/2017   Procedure: LAPAROTOMY;  Surgeon: Mellody Drown, MD;  Location: [facility] OR[facility];  Service: Gynecology;  Laterality: N/A;   LYMPH NODE DISSECTION N/A 03/14/2017   Procedure: LYMPH NODE DISSECTION;  Surgeon:  Mellody Drown, MD;  Location: [facility] ORS;  Service: Gynecology;  Laterality: N/A;   OMENTECTOMY N/A 03/14/2017   Procedure: OMENTECTOMY;  Surgeon: Mellody Drown, MD;  Location: [facility] OR[facility];  Service: Gynecology;  Laterality: N/A;   PORTA CATH INSERTION N/A 11/27/2016   Procedure: Glori Luis Cath Insertion;  Surgeon: Algernon Huxley, MD;  Location: [place];  Service: Cardiovascular;  Laterality: N/A;    SOCIAL HISTORY: Social History   Tobacco Use   Smoking status: Never   Smokeless tobacco: Never  Vaping Use   Vaping Use: Never used  Substance Use Topics   Alcohol use: Not Currently   Drug use: No     FAMILY HISTORY Family History  Problem Relation Age of Onset   Throat cancer Cousin    Throat cancer Cousin    Leukemia Cousin     ALLERGIES:  is allergic to omeprazole.  MEDICATIONS:  Current Outpatient Medications  Medication Sig Dispense Refill   furosemide (LASIX) 40 MG tablet Take 1 tablet (40 mg total) by mouth daily. 10 tablet 0   furosemide (LASIX) 40 MG tablet Take 1  tablet (40 mg total) by mouth daily. 30 tablet 1   levothyroxine (SYNTHROID) 100 MCG tablet Take 1 tablet (100 mcg total) by mouth daily. 90 tablet 3   lidocaine-prilocaine (EMLA) cream Apply 1 application topically as needed. Apply small amount to port site at least 1 hour prior to it being accessed, cover with plastic wrap 30 g 1   losartan (COZAAR) 50 MG tablet Take 1 tablet (50 mg total) by mouth daily. 30 tablet 0   metoprolol tartrate (LOPRESSOR) 25 MG tablet Take 1 tablet (25 mg total) by mouth 2 (two) times daily. 20 tablet 0   metoprolol tartrate (LOPRESSOR) 25 MG tablet Take 1 tablet (25 mg total) by mouth 2 (two) times daily. 30 tablet 1   ondansetron (ZOFRAN) 8 MG tablet Take 1 tablet (8 mg total) by mouth every 8 (eight) hours as needed for nausea or vomiting. 60 tablet 1   No current facility-administered medications for this visit.   Facility-Administered Medications Ordered in Other  Visits  Medication Dose Route Frequency Provider Last Rate Last Admin   pegfilgrastim (NEULASTA ONPRO KIT) 6 MG/0.6ML injection             PHYSICAL EXAMINATION:  ECOG PERFORMANCE STATUS: 0 - Asymptomatic Vitals:   12/30/21 0850  BP: (!) 189/96  Pulse: 88  Resp: 20  Temp: (!) 96.6 F (35.9 C)  SpO2: 100%    Filed Weights   12/30/21 0850  Weight: 120 lb 11.2 oz (54.7 kg)     Physical Exam Constitutional:      General: She is not in acute distress.    Appearance: She is not diaphoretic.     Comments: Thin built, she walks independantly  HENT:     Head: Normocephalic and atraumatic.     Nose: Nose normal.     Mouth/Throat:     Pharynx: No oropharyngeal exudate.  Eyes:     General: No scleral icterus.    Pupils: Pupils are equal, round, and reactive to light.  Neck:     Vascular: No JVD.  Cardiovascular:     Rate and Rhythm: Normal rate and regular rhythm.     Heart sounds: No murmur heard. Pulmonary:     Effort: Pulmonary effort is normal. No respiratory distress.     Breath sounds: Normal breath sounds. No rales.  Chest:     Chest wall: No tenderness.  Abdominal:     General: Bowel sounds are normal. There is no distension.     Palpations: Abdomen is soft.     Tenderness: There is no abdominal tenderness.  Musculoskeletal:        General: Normal range of motion.     Cervical back: Normal range of motion and neck supple.  Lymphadenopathy:     Cervical: No cervical adenopathy.  Skin:    General: Skin is warm and dry.     Findings: No erythema or rash.     Comments: Right anterior medi port +   Neurological:     Mental Status: She is alert and oriented to person, place, and time.     Cranial Nerves: No cranial nerve deficit.     Motor: No abnormal muscle tone.     Coordination: Coordination normal.  Psychiatric:        Mood and Affect: Affect normal.        Judgment: Judgment normal.        LABORATORY DATA: I have personally reviewed the data as  listed:  Latest Ref Rng & Units 12/30/2021    8:35 AM 12/16/2021    9:10 AM 11/18/2021   10:12 AM  CBC  WBC 4.0 - 10.5 K/uL 3.8  4.7  4.1   Hemoglobin 12.0 - 15.0 g/dL 7.9  7.5  8.5   Hematocrit 36.0 - 46.0 % 23.9  23.1  25.4   Platelets 150 - 400 K/uL 127  108  107       Latest Ref Rng & Units 12/30/2021    8:35 AM 12/16/2021    9:10 AM 11/18/2021   10:12 AM  CMP  Glucose 70 - 99 mg/dL 112  101  93   BUN 8 - 23 mg/dL _0 Creatinine 0.44 - 1.00 mg/dL 0.97  1.05  0.83   Sodium 135 - 145 mmol/L 134  134  134   Potassium 3.5 - 5.1 mmol/L 3.6  3.9  4.4   Chloride 98 - 111 mmol/L 110  109  103   CO2 22 - 32 mmol/L _1 Calcium 8.9 - 10.3 mg/dL 9.1  9.2  9.3   Total Protein 6.5 - 8.1 g/dL 6.5  6.3  6.6   Total Bilirubin 0.3 - 1.2 mg/dL 0.6  0.5  0.4   Alkaline Phos 38 - 126 U/L 60  59  61   AST 15 - 41 U/L _2 ALT 0 - 44 U/L _3 RADIOGRAPHIC STUDIES: I have personally reviewed the radiological images as listed and agreed with the findings in the report. CT CHEST ABDOMEN PELVIS W CONTRAST  Result Date: 12/27/2021 CLINICAL DATA:  Ovarian cancer. Leg swelling. Hysterectomy 2018. Chemotherapy ongoing. * Tracking Code: BO * EXAM: CT CHEST, ABDOMEN, AND PELVIS WITH CONTRAST TECHNIQUE: Multidetector CT imaging of the chest, abdomen and pelvis was performed following the standard protocol during bolus administration of intravenous contrast. RADIATION DOSE REDUCTION: This exam was performed according to the departmental dose-optimization program which includes automated exposure control, adjustment of the mA and/or kV according to patient size and/or use of iterative reconstruction technique. CONTRAST:  118m OMNIPAQUE IOHEXOL 300 MG/ML  SOLN COMPARISON:  CT 09/19/2021 CT CHEST FINDINGS Cardiovascular: Port in the anterior chest wall with tip in distal SVC. No significant vascular findings. Normal heart size. No pericardial effusion. Mediastinum/Nodes: No  axillary or supraclavicular adenopathy. No mediastinal or hilar adenopathy. No pericardial fluid. Esophagus normal. Lungs/Pleura: New moderate size layering RIGHT pleural effusion. LEFT lobe pulmonary nodule measures 6 mm (image 99/4) is unchanged. New nodule along the RIGHT oblique fissure measures 4 mm (image 61/4). Subtle pleural thickening more medially along the RIGHT oblique fissure is more prominent on image 75/4. Musculoskeletal: No aggressive osseous lesion. CT ABDOMEN AND PELVIS FINDINGS Hepatobiliary: No focal hepatic lesion. No biliary ductal dilatation. Gallbladder is normal. Common bile duct is normal. Pancreas: Pancreas is normal. No ductal dilatation. No pancreatic inflammation. Spleen: Normal spleen Adrenals/urinary tract: Adrenal glands and kidneys are normal. The ureters and bladder normal. Stomach/Bowel: Stomach, small bowel, appendix, and cecum are normal. The colon and rectosigmoid colon are normal. Vascular/Lymphatic: Abdominal aorta is normal caliber. Small periaortic lymph nodes again noted. Largest focus lymphoid tissue measures 10 mm on image 69/2 not changed from prior. No new lymphadenopathy abdomen pelvis. Reproductive: Post hysterectomy anatomy. Other: soft tissue lesion in the posterior RIGHT pelvis anterior to the sacrum measures 14 x 29 mm (image 109/2) compared  to 14 mm x 30 mm. Visually lesion looks similar. No new peritoneal implants Musculoskeletal: No aggressive osseous lesion. IMPRESSION: Chest Impression: 1. New RIGHT pleural effusion and new mild nodularity along the RIGHT oblique fissure. Recommend close attention on follow-up for potential pleural metastasis in the RIGHT hemithorax. 2. Stable LEFT lobe pulmonary nodule. 3. No mediastinal lymphadenopathy. Abdomen / Pelvis Impression: 1. Stable soft tissue lesion in the deep RIGHT pelvis. 2. Mass stable small periaortic lymph nodes. 3. No intraperitoneal free fluid the abdomen or pelvis. Electronically Signed   By: Suzy Bouchard M.D.   On: 12/27/2021 10:11   ECHOCARDIOGRAM COMPLETE  Result Date: 12/23/2021    ECHOCARDIOGRAM REPORT   Patient Name:   Katie Hill Date of Exam: 12/23/2021 Medical Rec #:  [id number]        Height:       70.0 in Accession #:    [id number]       Weight:       116.8 lb Date of Birth:  [birth year]        BSA:          1.661 m Patient Age:    40 years         BP:           170/83 mmHg Patient Gender: F                HR:           79 bpm. Exam Location:  [facility] Procedure: 2D Echo, Cardiac Doppler, Color Doppler and Strain Analysis Indications:     Z51.81 Z79.899                  Encounter for monitoring cardiotoxic drug therapy  History:         Patient has prior history of Echocardiogram examinations, most                  recent 10/14/2021. Risk Factors:Hypertension.  Sonographer:     Sherrie Sport Referring Phys:  Earlie Server Diagnosing Phys: Ida Rogue MD  Sonographer Comments: Global longitudinal strain was attempted. IMPRESSIONS  1. Left ventricular ejection fraction, by estimation, is 55%. The left ventricle has normal function. The left ventricle has no regional wall motion abnormalities. The average left ventricular global longitudinal strain is -13.5 %.  2. Right ventricular systolic function is normal. The right ventricular size is normal.  3. The mitral valve is normal in structure. Mild mitral valve regurgitation. No evidence of mitral stenosis.  4. Tricuspid valve regurgitation is mild to moderate.  5. The aortic valve is tricuspid. Aortic valve regurgitation is trivial. No aortic stenosis is present.  6. The inferior vena cava is normal in size with greater than 50% respiratory variability, suggesting right atrial pressure of 3 mmHg. FINDINGS  Left Ventricle: Left ventricular ejection fraction, by estimation, is 55%. The left ventricle has normal function. The left ventricle has no regional wall motion abnormalities. The average left ventricular global longitudinal strain is -13.5 %. The left   ventricular internal cavity size was normal in size. There is no left ventricular hypertrophy. Left ventricular diastolic parameters are indeterminate. Right Ventricle: The right ventricular size is normal. No increase in right ventricular wall thickness. Right ventricular systolic function is normal. Left Atrium: Left atrial size was normal in size. Right Atrium: Right atrial size was normal in size. Pericardium: There is no evidence of pericardial effusion. Mitral Valve: The mitral valve is normal in structure. Mild mitral  valve regurgitation. No evidence of mitral valve stenosis. Tricuspid Valve: The tricuspid valve is normal in structure. Tricuspid valve regurgitation is mild to moderate. No evidence of tricuspid stenosis. Aortic Valve: The aortic valve is tricuspid. Aortic valve regurgitation is trivial. No aortic stenosis is present. Aortic valve mean gradient measures 2.0 mmHg. Aortic valve peak gradient measures 4.3 mmHg. Aortic valve area, by VTI measures 2.38 cm. Pulmonic Valve: The pulmonic valve was normal in structure. Pulmonic valve regurgitation is not visualized. No evidence of pulmonic stenosis. Aorta: The aortic root is normal in size and structure. Venous: The inferior vena cava is normal in size with greater than 50% respiratory variability, suggesting right atrial pressure of 3 mmHg. IAS/Shunts: No atrial level shunt detected by color flow Doppler.  LEFT VENTRICLE PLAX 2D LVIDd:         5.00 cm LVIDs:         3.75 cm   2D Longitudinal Strain LV PW:         1.00 cm   2D Strain GLS Avg:     -13.5 % LV IVS:        0.90 cm LVOT diam:     2.10 cm LV SV:         48 LV SV Index:   29 LVOT Area:     3.46 cm  RIGHT VENTRICLE RV Basal diam:  3.60 cm RV Mid diam:    3.20 cm RV S prime:     13.60 cm/s TAPSE (M-mode): 2.8 cm LEFT ATRIUM             Index        RIGHT ATRIUM           Index LA diam:        3.50 cm 2.11 cm/m   RA Area:     22.90 cm LA Vol (A2C):   38.8 ml 23.36 ml/m  RA Volume:   74.30 ml   44.74 ml/m LA Vol (A4C):   34.4 ml 20.71 ml/m LA Biplane Vol: 37.1 ml 22.34 ml/m  AORTIC VALVE AV Area (Vmax):    2.32 cm AV Area (Vmean):   2.24 cm AV Area (VTI):     2.38 cm AV Vmax:           104.00 cm/s AV Vmean:          70.600 cm/s AV VTI:            0.204 m AV Peak Grad:      4.3 mmHg AV Mean Grad:      2.0 mmHg LVOT Vmax:         69.70 cm/s LVOT Vmean:        45.600 cm/s LVOT VTI:          0.140 m LVOT/AV VTI ratio: 0.69  AORTA Ao Root diam: 3.25 cm MITRAL VALVE               TRICUSPID VALVE MV Area (PHT): 4.10 cm    TR Peak grad:   21.0 mmHg MV Decel Time: 185 msec    TR Vmax:        229.00 cm/s MV E velocity: 83.30 cm/s MV A velocity: 53.50 cm/s  SHUNTS MV E/A ratio:  1.56        Systemic VTI:  0.14 m                            Systemic Diam:  2.10 cm Ida Rogue MD Electronically signed by Ida Rogue MD Signature Date/Time: 12/23/2021/10:04:27 AM    Final    US Venous Img Lower Bilateral  Result Date: 12/16/2021 CLINICAL DATA:  Bilateral leg swelling. EXAM: BILATERAL LOWER EXTREMITY VENOUS DOPPLER ULTRASOUND TECHNIQUE: Gray-scale sonography with compression, as well as color and duplex ultrasound, were performed to evaluate the deep venous system(s) from the level of the common femoral vein through the popliteal and proximal calf veins. COMPARISON:  None Available. FINDINGS: VENOUS Normal compressibility of the common femoral, superficial femoral, and popliteal veins, as well as the visualized calf veins. Visualized portions of profunda femoral vein and great saphenous vein unremarkable. No filling defects to suggest DVT on grayscale or color Doppler imaging. Doppler waveforms show normal direction of venous flow, normal respiratory plasticity and response to augmentation. OTHER 1.9 x 0.8 x 0.6 cm fluid collection left popliteal fossa suggest Baker's cyst. Limitations: none IMPRESSION: 1. No evidence of deep venous thrombosis in either lower extremity. 2. 1.9 cm left popliteal fossa  Baker's cyst. Electronically Signed   By: Misty Stanley M.D.   On: 12/16/2021 11:48   VAS US CAROTID  Result Date: 11/18/2021 Carotid Arterial Duplex Study Patient Name:  Katie Hill  Date of Exam:   11/15/2021 Medical Rec #: [id number]         Accession #:    [id number] Date of Birth: 1950/09/27         Patient Gender: F Patient Age:   5 years Exam Location:  [facility] Procedure:      VAS US CAROTID Referring Phys: Eulogio Ditch --------------------------------------------------------------------------------  Indications: Left bruit. Performing Technologist: Almira Coaster RVS  Examination Guidelines: A complete evaluation includes B-mode imaging, spectral Doppler, color Doppler, and power Doppler as needed of all accessible portions of each vessel. Bilateral testing is considered an integral part of a complete examination. Limited examinations for reoccurring indications may be performed as noted.  Right Carotid Findings: +----------+--------+--------+--------+------------------+--------+           PSV cm/sEDV cm/sStenosisPlaque DescriptionComments +----------+--------+--------+--------+------------------+--------+ CCA Prox  96      12                                         +----------+--------+--------+--------+------------------+--------+ CCA Mid   72      15                                         +----------+--------+--------+--------+------------------+--------+ CCA Distal53      13                                         +----------+--------+--------+--------+------------------+--------+ ICA Prox  52      12                                         +----------+--------+--------+--------+------------------+--------+ ICA Mid   99      32                                         +----------+--------+--------+--------+------------------+--------+  ICA Distal95      29                                          +----------+--------+--------+--------+------------------+--------+ ECA       58      0                                          +----------+--------+--------+--------+------------------+--------+ +----------+--------+-------+--------+-------------------+           PSV cm/sEDV cmsDescribeArm Pressure (mmHG) +----------+--------+-------+--------+-------------------+ [id number]      0                                  +----------+--------+-------+--------+-------------------+ +---------+--------+--+--------+--+ VertebralPSV cm/s50EDV cm/s13 +---------+--------+--+--------+--+  Left Carotid Findings: +----------+--------+--------+--------+------------------+--------+           PSV cm/sEDV cm/sStenosisPlaque DescriptionComments +----------+--------+--------+--------+------------------+--------+ CCA Prox  83      18                                         +----------+--------+--------+--------+------------------+--------+ CCA Mid   92      22                                         +----------+--------+--------+--------+------------------+--------+ CCA Distal70      21                                         +----------+--------+--------+--------+------------------+--------+ ICA Prox  37      13                                         +----------+--------+--------+--------+------------------+--------+ ICA Mid   83      22                                         +----------+--------+--------+--------+------------------+--------+ ICA Distal99      21                                         +----------+--------+--------+--------+------------------+--------+ ECA       55      7                                          +----------+--------+--------+--------+------------------+--------+ +----------+--------+--------+--------+-------------------+           PSV cm/sEDV cm/sDescribeArm Pressure (mmHG)  +----------+--------+--------+--------+-------------------+ Subclavian101     0                                   +----------+--------+--------+--------+-------------------+ +---------+--------+--+--------+--+  VertebralPSV cm/s44EDV cm/s13 +---------+--------+--+--------+--+   Summary: Right Carotid: Velocities in the right ICA are consistent with a 1-39% stenosis. Left Carotid: Velocities in the left ICA are consistent with a 1-39% stenosis. Vertebrals: Bilateral vertebral arteries demonstrate antegrade flow. *See table(s) above for measurements and observations.  Electronically signed by Leotis Pain MD on 11/18/2021 at 9:46:40 AM.    Final    ECHOCARDIOGRAM COMPLETE  Result Date: 10/14/2021    ECHOCARDIOGRAM REPORT   Patient Name:   Katie Hill Date of Exam: 10/14/2021 Medical Rec #:  [id number]        Height:       67.0 in Accession #:    [id number]       Weight:       113.0 lb Date of Birth:  04-Oct-1950        BSA:          1.587 m Patient Age:    68 years         BP:           158/92 mmHg Patient Gender: F                HR:           76 bpm. Exam Location:  [place] Procedure: 2D Echo, Cardiac Doppler and Color Doppler Indications:    Z09, Cardiotoxic drug therapy  History:        Patient has prior history of Echocardiogram examinations, most                 recent 01/14/2021. Risk Factors:Hypertension and Non-Smoker.                 Currently receiving chemotherapy.  Sonographer:    Pilar Jarvis RDMS, RVT, RDCS Referring Phys: [id number] [facility] Estrella Alcaraz  Sonographer Comments: Global longitudinal strain was attempted. Very limited acoustic windows IMPRESSIONS  1. Left ventricular ejection fraction, by estimation, is 55 to 60%. The left ventricle has normal function. The left ventricle has no regional wall motion abnormalities. Left ventricular diastolic parameters were normal. The average left ventricular global longitudinal strain is -13.8 %.  2. Right ventricular systolic function is normal. The right  ventricular size is normal. There is normal pulmonary artery systolic pressure. The estimated right ventricular systolic pressure is 75.1 mmHg.  3. The mitral valve is normal in structure. Mild mitral valve regurgitation. No evidence of mitral stenosis.  4. The aortic valve is normal in structure. Aortic valve regurgitation is mild. No aortic stenosis is present.  5. The inferior vena cava is normal in size with greater than 50% respiratory variability, suggesting right atrial pressure of 3 mmHg. FINDINGS  Left Ventricle: Left ventricular ejection fraction, by estimation, is 55 to 60%. The left ventricle has normal function. The left ventricle has no regional wall motion abnormalities. The average left ventricular global longitudinal strain is -13.8 %. The left ventricular internal cavity size was normal in size. There is no left ventricular hypertrophy. Left ventricular diastolic parameters were normal. Right Ventricle: The right ventricular size is normal. No increase in right ventricular wall thickness. Right ventricular systolic function is normal. There is normal pulmonary artery systolic pressure. The tricuspid regurgitant velocity is 2.38 m/s, and  with an assumed right atrial pressure of 5 mmHg, the estimated right ventricular systolic pressure is 70.0 mmHg. Left Atrium: Left atrial size was normal in size. Right Atrium: Right atrial size was normal in size. Pericardium: There is no evidence of pericardial effusion. Mitral Valve: The  mitral valve is normal in structure. Mild mitral valve regurgitation. No evidence of mitral valve stenosis. Tricuspid Valve: The tricuspid valve is normal in structure. Tricuspid valve regurgitation is mild . No evidence of tricuspid stenosis. Aortic Valve: The aortic valve is normal in structure. Aortic valve regurgitation is mild. No aortic stenosis is present. Aortic valve mean gradient measures 2.0 mmHg. Aortic valve peak gradient measures 4.1 mmHg. Aortic valve area, by VTI  measures 3.06 cm. Pulmonic Valve: The pulmonic valve was normal in structure. Pulmonic valve regurgitation is not visualized. No evidence of pulmonic stenosis. Aorta: The aortic root is normal in size and structure. Venous: The inferior vena cava is normal in size with greater than 50% respiratory variability, suggesting right atrial pressure of 3 mmHg. IAS/Shunts: No atrial level shunt detected by color flow Doppler.  LEFT VENTRICLE PLAX 2D LVIDd:         5.00 cm     Diastology LVIDs:         3.60 cm     LV e' medial:    7.83 cm/s LV PW:         1.10 cm     LV E/e' medial:  10.9 LV IVS:        1.00 cm     LV e' lateral:   8.05 cm/s LVOT diam:     2.10 cm     LV E/e' lateral: 10.6 LV SV:         63 LV SV Index:   40          2D Longitudinal Strain LVOT Area:     3.46 cm    2D Strain GLS Avg:     -13.8 %  LV Volumes (MOD) LV vol d, MOD A2C: 98.3 ml LV vol d, MOD A4C: 69.1 ml LV vol s, MOD A2C: 43.5 ml LV vol s, MOD A4C: 32.8 ml LV SV MOD A2C:     54.8 ml LV SV MOD A4C:     69.1 ml LV SV MOD BP:      45.3 ml RIGHT VENTRICLE             IVC RV S prime:     12.20 cm/s  IVC diam: 1.80 cm RVOT diam:      2.55 cm TAPSE (M-mode): 2.6 cm LEFT ATRIUM             Index        RIGHT ATRIUM           Index LA diam:        3.60 cm 2.27 cm/m   RA Area:     20.20 cm LA Vol (A2C):   80.5 ml 50.74 ml/m  RA Volume:   62.10 ml  39.14 ml/m LA Vol (A4C):   31.7 ml 19.98 ml/m LA Biplane Vol: 52.2 ml 32.90 ml/m  AORTIC VALVE                    PULMONIC VALVE AV Area (Vmax):    3.36 cm     PV Vmax:       0.72 m/s AV Area (Vmean):   2.87 cm     PV Peak grad:  2.1 mmHg AV Area (VTI):     3.06 cm AV Vmax:           101.00 cm/s AV Vmean:          74.900 cm/s AV VTI:  0.205 m AV Peak Grad:      4.1 mmHg AV Mean Grad:      2.0 mmHg LVOT Vmax:         97.90 cm/s LVOT Vmean:        62.100 cm/s LVOT VTI:          0.181 m LVOT/AV VTI ratio: 0.88 AR Vena Contracta: 0.20 cm  AORTA Ao Root diam: 3.30 cm Ao Asc diam:  3.20 cm Ao Arch  diam: 2.5 cm MITRAL VALVE               TRICUSPID VALVE MV Area (PHT): 4.99 cm    TR Peak grad:   22.7 mmHg MV Decel Time: 152 msec    TR Vmax:        238.00 cm/s MV E velocity: 85.70 cm/s MV A velocity: 58.30 cm/s  SHUNTS MV E/A ratio:  1.47        Systemic VTI:  0.18 m                            Systemic Diam: 2.10 cm                            Pulmonic Diam: 2.55 cm Ida Rogue MD Electronically signed by Ida Rogue MD Signature Date/Time: 10/14/2021/4:45:02 PM    Final

## 2021-12-30 NOTE — Assessment & Plan Note (Signed)
Continue hold bevacizumab.  I will check a 24-hour urine testing. Refer to nephrologist

## 2021-12-30 NOTE — Assessment & Plan Note (Signed)
Patient has been off chemotherapy for 6 weeks. She is relatively asymptomatic. Chemotherapy is held.  Close monitor.  There may be an effect of hemo dilution due to edema.

## 2022-01-02 ENCOUNTER — Other Ambulatory Visit: Payer: Self-pay

## 2022-01-02 DIAGNOSIS — J90 Pleural effusion, not elsewhere classified: Secondary | ICD-10-CM | POA: Diagnosis not present

## 2022-01-02 DIAGNOSIS — D6481 Anemia due to antineoplastic chemotherapy: Secondary | ICD-10-CM | POA: Diagnosis not present

## 2022-01-02 DIAGNOSIS — I10 Essential (primary) hypertension: Secondary | ICD-10-CM | POA: Diagnosis not present

## 2022-01-02 DIAGNOSIS — E039 Hypothyroidism, unspecified: Secondary | ICD-10-CM | POA: Diagnosis not present

## 2022-01-02 DIAGNOSIS — R808 Other proteinuria: Secondary | ICD-10-CM

## 2022-01-02 DIAGNOSIS — R809 Proteinuria, unspecified: Secondary | ICD-10-CM | POA: Diagnosis not present

## 2022-01-02 DIAGNOSIS — C561 Malignant neoplasm of right ovary: Secondary | ICD-10-CM | POA: Diagnosis not present

## 2022-01-02 LAB — PROTEIN, URINE, 24 HOUR
Collection Interval-UPROT: 24 hours
Protein, 24H Urine: 864 mg/d — ABNORMAL HIGH (ref 50–100)
Protein, Urine: 108 mg/dL
Urine Total Volume-UPROT: 800 mL

## 2022-01-03 LAB — CA 125: Cancer Antigen (CA) 125: 31.3 U/mL (ref 0.0–38.1)

## 2022-01-04 ENCOUNTER — Telehealth: Payer: Self-pay

## 2022-01-04 NOTE — Telephone Encounter (Signed)
Unable to reach pt x2 this morning. Detailed VM left with MD recommendation.

## 2022-01-04 NOTE — Telephone Encounter (Signed)
-----   Message from Earlie Server, MD sent at 01/03/2022  9:05 PM EDT ----- Recommend patient to contact primary care provider.  TSH is persistently high at 22.

## 2022-01-05 ENCOUNTER — Telehealth: Payer: Self-pay | Admitting: *Deleted

## 2022-01-05 NOTE — Telephone Encounter (Signed)
Patient called stating that she has been unable to reach her PCP and is not getting a return call form his office. I looked him up and gave her a second number to try to contact him on. She will try that number

## 2022-01-06 ENCOUNTER — Inpatient Hospital Stay: Payer: Medicare Other

## 2022-01-06 ENCOUNTER — Inpatient Hospital Stay (HOSPITAL_BASED_OUTPATIENT_CLINIC_OR_DEPARTMENT_OTHER): Payer: Medicare Other | Admitting: Nurse Practitioner

## 2022-01-06 ENCOUNTER — Encounter: Payer: Self-pay | Admitting: Nurse Practitioner

## 2022-01-06 VITALS — BP 171/90 | HR 87 | Temp 98.5°F | Resp 16 | Ht 70.0 in | Wt 118.6 lb

## 2022-01-06 DIAGNOSIS — I10 Essential (primary) hypertension: Secondary | ICD-10-CM

## 2022-01-06 DIAGNOSIS — C569 Malignant neoplasm of unspecified ovary: Secondary | ICD-10-CM

## 2022-01-06 DIAGNOSIS — D6481 Anemia due to antineoplastic chemotherapy: Secondary | ICD-10-CM | POA: Diagnosis not present

## 2022-01-06 DIAGNOSIS — R809 Proteinuria, unspecified: Secondary | ICD-10-CM | POA: Diagnosis not present

## 2022-01-06 DIAGNOSIS — J90 Pleural effusion, not elsewhere classified: Secondary | ICD-10-CM | POA: Diagnosis not present

## 2022-01-06 DIAGNOSIS — E039 Hypothyroidism, unspecified: Secondary | ICD-10-CM | POA: Diagnosis not present

## 2022-01-06 DIAGNOSIS — C561 Malignant neoplasm of right ovary: Secondary | ICD-10-CM | POA: Diagnosis not present

## 2022-01-06 LAB — CBC WITH DIFFERENTIAL/PLATELET
Abs Immature Granulocytes: 0.02 10*3/uL (ref 0.00–0.07)
Basophils Absolute: 0 10*3/uL (ref 0.0–0.1)
Basophils Relative: 1 %
Eosinophils Absolute: 0.2 10*3/uL (ref 0.0–0.5)
Eosinophils Relative: 4 %
HCT: 26.2 % — ABNORMAL LOW (ref 36.0–46.0)
Hemoglobin: 8.7 g/dL — ABNORMAL LOW (ref 12.0–15.0)
Immature Granulocytes: 1 %
Lymphocytes Relative: 23 %
Lymphs Abs: 1 10*3/uL (ref 0.7–4.0)
MCH: 35.7 pg — ABNORMAL HIGH (ref 26.0–34.0)
MCHC: 33.2 g/dL (ref 30.0–36.0)
MCV: 107.4 fL — ABNORMAL HIGH (ref 80.0–100.0)
Monocytes Absolute: 0.4 10*3/uL (ref 0.1–1.0)
Monocytes Relative: 9 %
Neutro Abs: 2.8 10*3/uL (ref 1.7–7.7)
Neutrophils Relative %: 62 %
Platelets: 118 10*3/uL — ABNORMAL LOW (ref 150–400)
RBC: 2.44 MIL/uL — ABNORMAL LOW (ref 3.87–5.11)
RDW: 15.4 % (ref 11.5–15.5)
WBC: 4.4 10*3/uL (ref 4.0–10.5)
nRBC: 0 % (ref 0.0–0.2)

## 2022-01-06 LAB — COMPREHENSIVE METABOLIC PANEL
ALT: 17 U/L (ref 0–44)
AST: 30 U/L (ref 15–41)
Albumin: 3.8 g/dL (ref 3.5–5.0)
Alkaline Phosphatase: 58 U/L (ref 38–126)
Anion gap: 6 (ref 5–15)
BUN: 28 mg/dL — ABNORMAL HIGH (ref 8–23)
CO2: 22 mmol/L (ref 22–32)
Calcium: 9.2 mg/dL (ref 8.9–10.3)
Chloride: 107 mmol/L (ref 98–111)
Creatinine, Ser: 1.33 mg/dL — ABNORMAL HIGH (ref 0.44–1.00)
GFR, Estimated: 43 mL/min — ABNORMAL LOW (ref >60)
Glucose, Bld: 124 mg/dL — ABNORMAL HIGH (ref 70–99)
Potassium: 3.5 mmol/L (ref 3.5–5.1)
Sodium: 135 mmol/L (ref 135–145)
Total Bilirubin: 0.6 mg/dL (ref 0.3–1.2)
Total Protein: 6.6 g/dL (ref 6.5–8.1)

## 2022-01-06 MED ORDER — METOPROLOL TARTRATE 50 MG PO TABS: 50.0000 mg | ORAL_TABLET | Freq: Two times a day (BID) | ORAL | 0 refills | Status: DC

## 2022-01-06 NOTE — Progress Notes (Signed)
Hematology/Oncology Progress note [phone] [phone]     Patient Care Team: Cletis Athens, MD as PCP - General (Internal Medicine) Clent Jacks, [name], [name], [name], MD as Referring Physician (Obstetrics and Gynecology)  CHIEF COMPLAINTS/PURPOSE OF Visit Follow up for chemotherapy for ovarian cancer.  HISTORY OF PRESENTING ILLNESS: Katie Hill 71 y.o. female presents for follow up of management of recurrent  ovarian cancer. Oncology History  Malignant neoplasm of ovary ([place])  09/20/2011 Imaging   CT chest abdomen pelvis w contrast 1. No significant interval changed compared with the previous exam.2. Stable appearance of soft tissue attenuating lesion in the posterolateral right hemipelvis. This may represent a treated site of peritoneal disease.3. Unchanged appearance of borderline enlarged left periaortic and aortocaval retroperitoneal lymph nodes.4. Unchanged 6 mm left lower lobe lung nodule. 5. No new signs of metastatic disease within the chest, abdomen or pelvis. 6. Aortic Atherosclerosis (ICD10-I70.0) and Emphysema    11/20/2016 Initial Diagnosis   Malignant neoplasm of ovary   Pathology 11/16/2016 Surgical Pathology  CASE: [id number]  PATIENT: Katie Hill  Surgical Pathology Report   SPECIMEN SUBMITTED:  A. Retroperitoneal adenopathy, left  DIAGNOSIS:  A. LYMPH NODE, LEFT RETROPERITONEAL; CT-GUIDED CORE BIOPSY:  - METASTATIC HIGH-GRADE SEROUS CARCINOMA.  Pathology 03/14/2017   DIAGNOSIS:  A. OMENTUM; OMENTECTOMY:  - NO TUMOR SEEN.  - ONE NEGATIVE LYMPH NODE (0/1).   B.  RIGHT FALLOPIAN TUBE AND OVARY; SALPINGO-OOPHORECTOMY:  - SMALL FOCI OF HIGH GRADE SEROUS CARCINOMA INVOLVING THE OVARY.  - MARKED THERAPY RELATED CHANGE.  - NO TUMOR SEEN IN THE FALLOPIAN TUBE.   C.  UTERUS,  CERVIX, LEFT FALLOPIAN TUBE AND OVARY; HYSTERECTOMY AND LEFT  SALPINGO-OOPHORECTOMY:  - NABOTHIAN CYSTS.  - CYSTIC ATROPHY OF THE ENDOMETRIUM.  - SEROSAL ADHESIONS.  - UNREMARKABLE FALLOPIAN TUBE.  - OVARY SHOWING TREATMENT RELATED CHANGE.   D.  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    12/01/2016 -  Chemotherapy   Carboplatin and taxol x 4 neoadjuvant chemotherapy    03/14/2017 Surgery   Patient had debulking surgery on 03/14/2017. She had a laparoscopy with conversion to laparotomy, total hysterectomy, with bilateral salpingo oophorectomy, right aortic lymph node dissection, omentectomy. Pathology showed small foci of residual disease in ovary.   HRD positive, declined Olarparib maintenance trial    03/28/2017 -  Chemotherapy   Adjuvant carbo and taxol x3 cycles   02/28/2018 Progression   Local Recurrence. CA125 146 CT abdomen pelvis w contrast showed interval enlargement of an aortocaval lymph node which currently measures 1.4 cm in short axis (axial image 75 of series 2). This lymph node is in close proximity to the previously resected retroperitoneal lymphadenopathy and is very concerning for an additional nodal metastasis.    03/15/2018 -  Chemotherapy   03/15/2018-05/17/2018 Carboplatin and Taxol x 4.  CA125 decrease from 49.7 to 6 after 4 cycles of treatment.  05/17/2018-10/29/20   Olarparib 333m BID    11/05/2018 Imaging   MRI abdomenPelvis on 11/05/2018 showed Interval progression of, now measuring 2.7 x 2.3 cm and concerning for progression of metastatic disease.retrocaval lymph node in the abdomen Olaparib was held for short period of time. Her CA125 trended down again.  Dr. BFransisca Connorsrecommending resume olaparib and continue monitor.   10/06/2020 Progression    CT chest abdomen pelvis with contrast  showed new lymph nodes in the prevascular space of the upper anterior mediastinum most consistent with  metastasis.  Single new right lower lobe pulmonary nodule is concerning for early pulmonary metastasis.  Interval enlargement of the left periaortic retroperitoneal lymph node.  Stable adjacent aorto caval adenopathy.    Genetic Testing   Genetic testing negative for 83 genes on [facility]'s Multi-Cancer panel (ALK, APC, ATM, AXIN2, BAP1, BARD1, BLM, BMPR1A, BRCA1, BRCA2, BRIP1, CASR, CDC73, CDH1, CDK4, CDKN1B, CDKN1C, CDKN2A, CEBPA, CHEK2, CTNNA1, DICER1, DIS3L2, EGFR, EPCAM, FH, FLCN, GATA2, GPC3, GREM1, HOXB13, HRAS, KIT, MAX, MEN1, MET, MITF, MLH1, MSH2, MSH3, MSH6, MUTYH, NBN, NF1, NF2, NTHL1, PALB2, PDGFRA, PHOX2B, PMS2, POLD1, POLE, POT1, PRKAR1A, PTCH1, PTEN, RAD50, RAD51C, RAD51D, RB1, RECQL4, RET, RUNX1, SDHA, SDHAF2, SDHB, SDHC, SDHD, SMAD4, SMARCA4, SMARCB1, SMARCE1, STK11, SUFU, TERC, TERT, TMEM127, TP53, TSC1, TSC2, VHL, WRN, WT1).   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  Myraid testing negative for somatic BRACA1/2, positive for HRD.    11/03/2020 -  Chemotherapy   Liposomal Doxorubicin + Bevacizumab q28d   11/03/20 -05/20/21 Doxil + Bevacizumab q28d  06/03/21 Doxil only due to proteinuria.  07/01/21-07/15/21  Resumed on Doxil + Bevacizumab 07/29/21 Doxil only due to proteinuria.    10/14/2021 Echocardiogram    Left ventricular ejection fraction, by estimation, is 55 to 60%   12/16/2021 Imaging   [place] lower extremities 1. No evidence of deep venous thrombosis in either lower extremity. 2. 1.9 cm left popliteal fossa Baker's cyst.   12/23/2021 Echocardiogram   LVEF 55%   12/26/2021 Imaging   CT chest abdomen pelvis w contrast   Chest Impression:   1. New RIGHT pleural effusion and new mild nodularity along the RIGHT oblique fissure. Recommend close attention on follow-up for potential pleural metastasis in the RIGHT hemithorax. 2. Stable LEFT lobe pulmonary nodule.3. No mediastinal lymphadenopathy.   Abdomen / Pelvis Impression:   1. Stable soft  tissue lesion in the deep RIGHT pelvis. 2. Mass stable small periaortic lymph nodes. 3. No intraperitoneal free fluid the abdomen or pelvis.   High grade ovarian cancer (HCC)   Interval History: patient returns to clinic for blood pressure check. Persistently hypertensive. Reports compliance with medications. Tolerating well. BP log at home is persistently ranging from 150s/80s to 170-180s/90s. Able to mow grass. Fatigue is stable.  . Review of Systems  Constitutional:  Positive for fatigue. Negative for appetite change, chills and fever.  HENT:   Negative for hearing loss and voice change.   Eyes:  Negative for eye problems.  Respiratory:  Negative for chest tightness and cough.   Cardiovascular:  Negative for chest pain and leg swelling.  Gastrointestinal:  Negative for abdominal distention, abdominal pain and blood in stool.  Endocrine: Negative for hot flashes.  Genitourinary:  Negative for difficulty urinating and frequency.   Musculoskeletal:  Negative for arthralgias.  Skin:  Negative for itching and rash.  Neurological:  Negative for extremity weakness.  Hematological:  Negative for adenopathy.  Psychiatric/Behavioral:  Negative for confusion.    [name] MEDICAL HISTORY: Past Medical History:  Diagnosis Date   Dysrhythmia    Genetic testing 03/28/2017   Multi-Cancer panel (83 genes) @ [facility] - No pathogenic mutations detected   High grade ovarian cancer ([place]) 11/20/2016   Pelvic mass in female     SURGICAL HISTORY: Past Surgical History:  Procedure Laterality Date   APPENDECTOMY     LAPAROSCOPY N/A 03/14/2017   Procedure: LAPAROSCOPY OPERATIVE;  Surgeon: Mellody Drown,  MD;  Location: [facility];  Service: Gynecology;  Laterality: N/A;   LAPAROTOMY N/A 03/14/2017   Procedure: LAPAROTOMY;  Surgeon: Mellody Drown, MD;  Location: [facility];  Service: Gynecology;  Laterality: N/A;   LYMPH NODE DISSECTION N/A 03/14/2017   Procedure: LYMPH NODE DISSECTION;  Surgeon: Mellody Drown, MD;  Location: [facility];  Service: Gynecology;  Laterality: N/A;   OMENTECTOMY N/A 03/14/2017   Procedure: OMENTECTOMY;  Surgeon: Mellody Drown, MD;  Location: [facility];  Service: Gynecology;  Laterality: N/A;   PORTA CATH INSERTION N/A 11/27/2016   Procedure: Glori Luis Cath Insertion;  Surgeon: Algernon Huxley, MD;  Location: [facility];  Service: Cardiovascular;  Laterality: N/A;    SOCIAL HISTORY: Social History   Tobacco Use   Smoking status: Never   Smokeless tobacco: Never  Vaping Use   Vaping Use: Never used  Substance Use Topics   Alcohol use: Not Currently   Drug use: No     FAMILY HISTORY Family History  Problem Relation Age of Onset   Throat cancer Cousin    Throat cancer Cousin    Leukemia Cousin     ALLERGIES:  is allergic to omeprazole.  MEDICATIONS:  Current Outpatient Medications  Medication Sig Dispense Refill   furosemide (LASIX) 40 MG tablet Take 1 tablet (40 mg total) by mouth daily. 10 tablet 0   furosemide (LASIX) 40 MG tablet Take 1 tablet (40 mg total) by mouth daily. 30 tablet 1   levothyroxine (SYNTHROID) 100 MCG tablet Take 1 tablet (100 mcg total) by mouth daily. 90 tablet 3   lidocaine-prilocaine (EMLA) cream Apply 1 application topically as needed. Apply small amount to port site at least 1 hour prior to it being accessed, cover with plastic wrap 30 g 1   losartan (COZAAR) 50 MG tablet Take 1 tablet (50 mg total) by mouth daily. 30 tablet 0   metoprolol tartrate (LOPRESSOR) 25 MG tablet Take 1 tablet (25 mg total) by mouth 2 (two) times daily. 20 tablet 0   metoprolol tartrate (LOPRESSOR) 25 MG tablet Take 1 tablet (25 mg total) by mouth 2 (two) times daily. 30 tablet 1   ondansetron (ZOFRAN) 8 MG tablet Take 1 tablet (8 mg total) by mouth every 8 (eight) hours as needed for nausea or vomiting. 60 tablet 1   No current facility-administered medications for this visit.   Facility-Administered Medications Ordered in Other Visits   Medication Dose Route Frequency Provider Last Rate Last Admin   pegfilgrastim (NEULASTA ONPRO KIT) 6 MG/0.6ML injection             PHYSICAL EXAMINATION:  ECOG PERFORMANCE STATUS: 0 - Asymptomatic Vitals:   01/06/22 0934  BP: (!) 171/90  Pulse: 87  Resp: 16  Temp: 98.5 F (36.9 C)  SpO2: 100%    Filed Weights   01/06/22 0934  Weight: 118 lb 9.6 oz (53.8 kg)   Physical Exam Constitutional:      Appearance: She is not ill-appearing.  Eyes:     General: No scleral icterus.    Conjunctiva/sclera: Conjunctivae normal.  Cardiovascular:     Rate and Rhythm: Normal rate and regular rhythm.  Abdominal:     General: There is no distension.     Palpations: Abdomen is soft.     Tenderness: There is no abdominal tenderness. There is no guarding.  Musculoskeletal:        General: No deformity.     Right lower leg: No edema.  Left lower leg: No edema.  Lymphadenopathy:     Cervical: No cervical adenopathy.  Skin:    General: Skin is warm and dry.  Neurological:     Mental Status: She is alert and oriented to person, place, and time. Mental status is at baseline.  Psychiatric:        Mood and Affect: Mood normal.        Behavior: Behavior normal.        LABORATORY DATA: I have personally reviewed the data as listed:     Latest Ref Rng & Units 12/30/2021    8:35 AM 12/16/2021    9:10 AM 11/18/2021   10:12 AM  CBC  WBC 4.0 - 10.5 K/uL 3.8  4.7  4.1   Hemoglobin 12.0 - 15.0 g/dL 7.9  7.5  8.5   Hematocrit 36.0 - 46.0 % 23.9  23.1  25.4   Platelets 150 - 400 K/uL 127  108  107       Latest Ref Rng & Units 12/30/2021    8:35 AM 12/16/2021    9:10 AM 11/18/2021   10:12 AM  CMP  Glucose 70 - 99 mg/dL 112  101  93   BUN 8 - 23 mg/dL _0 Creatinine 0.44 - 1.00 mg/dL 0.97  1.05  0.83   Sodium 135 - 145 mmol/L 134  134  134   Potassium 3.5 - 5.1 mmol/L 3.6  3.9  4.4   Chloride 98 - 111 mmol/L 110  109  103   CO2 22 - 32 mmol/L _1 Calcium 8.9 - 10.3  mg/dL 9.1  9.2  9.3   Total Protein 6.5 - 8.1 g/dL 6.5  6.3  6.6   Total Bilirubin 0.3 - 1.2 mg/dL 0.6  0.5  0.4   Alkaline Phos 38 - 126 U/L 60  59  61   AST 15 - 41 U/L _2 ALT 0 - 44 U/L _3 Lab Results  Component Value Date   TSH 22.325 (H) 12/30/2021      RADIOGRAPHIC STUDIES: I have personally reviewed the radiological images as listed and agreed with the findings in the report. CT CHEST ABDOMEN PELVIS W CONTRAST  Result Date: 12/27/2021 CLINICAL DATA:  Ovarian cancer. Leg swelling. Hysterectomy 2018. Chemotherapy ongoing. * Tracking Code: BO * EXAM: CT CHEST, ABDOMEN, AND PELVIS WITH CONTRAST TECHNIQUE: Multidetector CT imaging of the chest, abdomen and pelvis was performed following the standard protocol during bolus administration of intravenous contrast. RADIATION DOSE REDUCTION: This exam was performed according to the departmental dose-optimization program which includes automated exposure control, adjustment of the mA and/or kV according to patient size and/or use of iterative reconstruction technique. CONTRAST:  120m OMNIPAQUE IOHEXOL 300 MG/ML  SOLN COMPARISON:  CT 09/19/2021 CT CHEST FINDINGS Cardiovascular: Port in the anterior chest wall with tip in distal SVC. No significant vascular findings. Normal heart size. No pericardial effusion. Mediastinum/Nodes: No axillary or supraclavicular adenopathy. No mediastinal or hilar adenopathy. No pericardial fluid. Esophagus normal. Lungs/Pleura: New moderate size layering RIGHT pleural effusion. LEFT lobe pulmonary nodule measures 6 mm (image 99/4) is unchanged. New nodule along the RIGHT oblique fissure measures 4 mm (image 61/4). Subtle pleural thickening more medially along the RIGHT oblique fissure is more prominent on image 75/4. Musculoskeletal: No aggressive osseous lesion. CT ABDOMEN AND PELVIS FINDINGS Hepatobiliary: No focal hepatic lesion. No  biliary ductal dilatation. Gallbladder is normal. Common bile duct  is normal. Pancreas: Pancreas is normal. No ductal dilatation. No pancreatic inflammation. Spleen: Normal spleen Adrenals/urinary tract: Adrenal glands and kidneys are normal. The ureters and bladder normal. Stomach/Bowel: Stomach, small bowel, appendix, and cecum are normal. The colon and rectosigmoid colon are normal. Vascular/Lymphatic: Abdominal aorta is normal caliber. Small periaortic lymph nodes again noted. Largest focus lymphoid tissue measures 10 mm on image 69/2 not changed from prior. No new lymphadenopathy abdomen pelvis. Reproductive: Post hysterectomy anatomy. Other: soft tissue lesion in the posterior RIGHT pelvis anterior to the sacrum measures 14 x 29 mm (image 109/2) compared to 14 mm x 30 mm. Visually lesion looks similar. No new peritoneal implants Musculoskeletal: No aggressive osseous lesion. IMPRESSION: Chest Impression: 1. New RIGHT pleural effusion and new mild nodularity along the RIGHT oblique fissure. Recommend close attention on follow-up for potential pleural metastasis in the RIGHT hemithorax. 2. Stable LEFT lobe pulmonary nodule. 3. No mediastinal lymphadenopathy. Abdomen / Pelvis Impression: 1. Stable soft tissue lesion in the deep RIGHT pelvis. 2. Mass stable small periaortic lymph nodes. 3. No intraperitoneal free fluid the abdomen or pelvis. Electronically Signed   By: Suzy Bouchard M.D.   On: 12/27/2021 10:11   ECHOCARDIOGRAM COMPLETE  Result Date: 12/23/2021    ECHOCARDIOGRAM REPORT   Patient Name:   Katie Hill Date of Exam: 12/23/2021 Medical Rec #:  [id number]        Height:       70.0 in Accession #:    [id number]       Weight:       116.8 lb Date of Birth:  01-21-1951        BSA:          1.661 m Patient Age:    71 years         BP:           170/83 mmHg Patient Gender: F                HR:           79 bpm. Exam Location:  [facility] Procedure: 2D Echo, Cardiac Doppler, Color Doppler and Strain Analysis Indications:     Z51.81 Z79.899                  Encounter for  monitoring cardiotoxic drug therapy  History:         Patient has prior history of Echocardiogram examinations, most                  recent 10/14/2021. Risk Factors:Hypertension.  Sonographer:     Sherrie Sport Referring Phys:  Earlie Server Diagnosing Phys: Ida Rogue MD  Sonographer Comments: Global longitudinal strain was attempted. IMPRESSIONS  1. Left ventricular ejection fraction, by estimation, is 55%. The left ventricle has normal function. The left ventricle has no regional wall motion abnormalities. The average left ventricular global longitudinal strain is -13.5 %.  2. Right ventricular systolic function is normal. The right ventricular size is normal.  3. The mitral valve is normal in structure. Mild mitral valve regurgitation. No evidence of mitral stenosis.  4. Tricuspid valve regurgitation is mild to moderate.  5. The aortic valve is tricuspid. Aortic valve regurgitation is trivial. No aortic stenosis is present.  6. The inferior vena cava is normal in size with greater than 50% respiratory variability, suggesting right atrial pressure of 3 mmHg. FINDINGS  Left Ventricle: Left ventricular ejection fraction, by  estimation, is 55%. The left ventricle has normal function. The left ventricle has no regional wall motion abnormalities. The average left ventricular global longitudinal strain is -13.5 %. The left  ventricular internal cavity size was normal in size. There is no left ventricular hypertrophy. Left ventricular diastolic parameters are indeterminate. Right Ventricle: The right ventricular size is normal. No increase in right ventricular wall thickness. Right ventricular systolic function is normal. Left Atrium: Left atrial size was normal in size. Right Atrium: Right atrial size was normal in size. Pericardium: There is no evidence of pericardial effusion. Mitral Valve: The mitral valve is normal in structure. Mild mitral valve regurgitation. No evidence of mitral valve stenosis. Tricuspid Valve: The  tricuspid valve is normal in structure. Tricuspid valve regurgitation is mild to moderate. No evidence of tricuspid stenosis. Aortic Valve: The aortic valve is tricuspid. Aortic valve regurgitation is trivial. No aortic stenosis is present. Aortic valve mean gradient measures 2.0 mmHg. Aortic valve peak gradient measures 4.3 mmHg. Aortic valve area, by VTI measures 2.38 cm. Pulmonic Valve: The pulmonic valve was normal in structure. Pulmonic valve regurgitation is not visualized. No evidence of pulmonic stenosis. Aorta: The aortic root is normal in size and structure. Venous: The inferior vena cava is normal in size with greater than 50% respiratory variability, suggesting right atrial pressure of 3 mmHg. IAS/Shunts: No atrial level shunt detected by color flow Doppler.  LEFT VENTRICLE PLAX 2D LVIDd:         5.00 cm LVIDs:         3.75 cm   2D Longitudinal Strain LV PW:         1.00 cm   2D Strain GLS Avg:     -13.5 % LV IVS:        0.90 cm LVOT diam:     2.10 cm LV SV:         48 LV SV Index:   29 LVOT Area:     3.46 cm  RIGHT VENTRICLE RV Basal diam:  3.60 cm RV Mid diam:    3.20 cm RV S prime:     13.60 cm/s TAPSE (M-mode): 2.8 cm LEFT ATRIUM             Index        RIGHT ATRIUM           Index LA diam:        3.50 cm 2.11 cm/m   RA Area:     22.90 cm LA Vol (A2C):   38.8 ml 23.36 ml/m  RA Volume:   74.30 ml  44.74 ml/m LA Vol (A4C):   34.4 ml 20.71 ml/m LA Biplane Vol: 37.1 ml 22.34 ml/m  AORTIC VALVE AV Area (Vmax):    2.32 cm AV Area (Vmean):   2.24 cm AV Area (VTI):     2.38 cm AV Vmax:           104.00 cm/s AV Vmean:          70.600 cm/s AV VTI:            0.204 m AV Peak Grad:      4.3 mmHg AV Mean Grad:      2.0 mmHg LVOT Vmax:         69.70 cm/s LVOT Vmean:        45.600 cm/s LVOT VTI:          0.140 m LVOT/AV VTI ratio: 0.69  AORTA Ao Root diam: 3.25 cm MITRAL  VALVE               TRICUSPID VALVE MV Area (PHT): 4.10 cm    TR Peak grad:   21.0 mmHg MV Decel Time: 185 msec    TR Vmax:         229.00 cm/s MV E velocity: 83.30 cm/s MV A velocity: 53.50 cm/s  SHUNTS MV E/A ratio:  1.56        Systemic VTI:  0.14 m                            Systemic Diam: 2.10 cm Ida Rogue MD Electronically signed by Ida Rogue MD Signature Date/Time: 12/23/2021/10:04:27 AM    Final    US Venous Img Lower Bilateral  Result Date: 12/16/2021 CLINICAL DATA:  Bilateral leg swelling. EXAM: BILATERAL LOWER EXTREMITY VENOUS DOPPLER ULTRASOUND TECHNIQUE: Gray-scale sonography with compression, as well as color and duplex ultrasound, were performed to evaluate the deep venous system(s) from the level of the common femoral vein through the popliteal and proximal calf veins. COMPARISON:  None Available. FINDINGS: VENOUS Normal compressibility of the common femoral, superficial femoral, and popliteal veins, as well as the visualized calf veins. Visualized portions of profunda femoral vein and great saphenous vein unremarkable. No filling defects to suggest DVT on grayscale or color Doppler imaging. Doppler waveforms show normal direction of venous flow, normal respiratory plasticity and response to augmentation. OTHER 1.9 x 0.8 x 0.6 cm fluid collection left popliteal fossa suggest Baker's cyst. Limitations: none IMPRESSION: 1. No evidence of deep venous thrombosis in either lower extremity. 2. 1.9 cm left popliteal fossa Baker's cyst. Electronically Signed   By: Misty Stanley M.D.   On: 12/16/2021 11:48   VAS US CAROTID  Result Date: 11/18/2021 Carotid Arterial Duplex Study Patient Name:  Katie Hill  Date of Exam:   11/15/2021 Medical Rec #: [id number]         Accession #:    [id number] Date of Birth: [birth year]         Patient Gender: F Patient Age:   76 years Exam Location:  [facility] Procedure:      VAS US CAROTID Referring Phys: Eulogio Ditch --------------------------------------------------------------------------------  Indications: Left bruit. Performing Technologist: Almira Coaster RVS   Examination Guidelines: A complete evaluation includes B-mode imaging, spectral Doppler, color Doppler, and power Doppler as needed of all accessible portions of each vessel. Bilateral testing is considered an integral part of a complete examination. Limited examinations for reoccurring indications may be performed as noted.  Right Carotid Findings: +----------+--------+--------+--------+------------------+--------+           PSV cm/sEDV cm/sStenosisPlaque DescriptionComments +----------+--------+--------+--------+------------------+--------+ CCA Prox  96      12                                         +----------+--------+--------+--------+------------------+--------+ CCA Mid   72      15                                         +----------+--------+--------+--------+------------------+--------+ CCA Distal53      13                                         +----------+--------+--------+--------+------------------+--------+  ICA Prox  52      12                                         +----------+--------+--------+--------+------------------+--------+ ICA Mid   99      32                                         +----------+--------+--------+--------+------------------+--------+ ICA Distal95      29                                         +----------+--------+--------+--------+------------------+--------+ ECA       58      0                                          +----------+--------+--------+--------+------------------+--------+ +----------+--------+-------+--------+-------------------+           PSV cm/sEDV cmsDescribeArm Pressure (mmHG) +----------+--------+-------+--------+-------------------+ [id number]      0                                  +----------+--------+-------+--------+-------------------+ +---------+--------+--+--------+--+ VertebralPSV cm/s50EDV cm/s13 +---------+--------+--+--------+--+  Left Carotid Findings:  +----------+--------+--------+--------+------------------+--------+           PSV cm/sEDV cm/sStenosisPlaque DescriptionComments +----------+--------+--------+--------+------------------+--------+ CCA Prox  83      18                                         +----------+--------+--------+--------+------------------+--------+ CCA Mid   92      22                                         +----------+--------+--------+--------+------------------+--------+ CCA Distal70      21                                         +----------+--------+--------+--------+------------------+--------+ ICA Prox  37      13                                         +----------+--------+--------+--------+------------------+--------+ ICA Mid   83      22                                         +----------+--------+--------+--------+------------------+--------+ ICA Distal99      21                                         +----------+--------+--------+--------+------------------+--------+ ECA  55      7                                          +----------+--------+--------+--------+------------------+--------+ +----------+--------+--------+--------+-------------------+           PSV cm/sEDV cm/sDescribeArm Pressure (mmHG) +----------+--------+--------+--------+-------------------+ Subclavian101     0                                   +----------+--------+--------+--------+-------------------+ +---------+--------+--+--------+--+ VertebralPSV cm/s44EDV cm/s13 +---------+--------+--+--------+--+   Summary: Right Carotid: Velocities in the right ICA are consistent with a 1-39% stenosis. Left Carotid: Velocities in the left ICA are consistent with a 1-39% stenosis. Vertebrals: Bilateral vertebral arteries demonstrate antegrade flow. *See table(s) above for measurements and observations.  Electronically signed by Leotis Pain MD on 11/18/2021 at 9:46:40 AM.    Final     ECHOCARDIOGRAM COMPLETE  Result Date: 10/14/2021    ECHOCARDIOGRAM REPORT   Patient Name:   Katie Hill Date of Exam: 10/14/2021 Medical Rec #:  [id number]        Height:       67.0 in Accession #:    [id number]       Weight:       113.0 lb Date of Birth:  1951/01/10        BSA:          1.587 m Patient Age:    20 years         BP:           158/92 mmHg Patient Gender: F                HR:           76 bpm. Exam Location:  [facility] Procedure: 2D Echo, Cardiac Doppler and Color Doppler Indications:    Z09, Cardiotoxic drug therapy  History:        Patient has prior history of Echocardiogram examinations, most                 recent 01/14/2021. Risk Factors:Hypertension and Non-Smoker.                 Currently receiving chemotherapy.  Sonographer:    Pilar Jarvis RDMS, RVT, RDCS Referring Phys: [id number] [facility]  Sonographer Comments: Global longitudinal strain was attempted. Very limited acoustic windows IMPRESSIONS  1. Left ventricular ejection fraction, by estimation, is 55 to 60%. The left ventricle has normal function. The left ventricle has no regional wall motion abnormalities. Left ventricular diastolic parameters were normal. The average left ventricular global longitudinal strain is -13.8 %.  2. Right ventricular systolic function is normal. The right ventricular size is normal. There is normal pulmonary artery systolic pressure. The estimated right ventricular systolic pressure is 41.2 mmHg.  3. The mitral valve is normal in structure. Mild mitral valve regurgitation. No evidence of mitral stenosis.  4. The aortic valve is normal in structure. Aortic valve regurgitation is mild. No aortic stenosis is present.  5. The inferior vena cava is normal in size with greater than 50% respiratory variability, suggesting right atrial pressure of 3 mmHg. FINDINGS  Left Ventricle: Left ventricular ejection fraction, by estimation, is 55 to 60%. The left ventricle has normal function. The left ventricle has no  regional wall motion abnormalities. The average left ventricular global longitudinal strain  is -13.8 %. The left ventricular internal cavity size was normal in size. There is no left ventricular hypertrophy. Left ventricular diastolic parameters were normal. Right Ventricle: The right ventricular size is normal. No increase in right ventricular wall thickness. Right ventricular systolic function is normal. There is normal pulmonary artery systolic pressure. The tricuspid regurgitant velocity is 2.38 m/s, and  with an assumed right atrial pressure of 5 mmHg, the estimated right ventricular systolic pressure is 16.9 mmHg. Left Atrium: Left atrial size was normal in size. Right Atrium: Right atrial size was normal in size. Pericardium: There is no evidence of pericardial effusion. Mitral Valve: The mitral valve is normal in structure. Mild mitral valve regurgitation. No evidence of mitral valve stenosis. Tricuspid Valve: The tricuspid valve is normal in structure. Tricuspid valve regurgitation is mild . No evidence of tricuspid stenosis. Aortic Valve: The aortic valve is normal in structure. Aortic valve regurgitation is mild. No aortic stenosis is present. Aortic valve mean gradient measures 2.0 mmHg. Aortic valve peak gradient measures 4.1 mmHg. Aortic valve area, by VTI measures 3.06 cm. Pulmonic Valve: The pulmonic valve was normal in structure. Pulmonic valve regurgitation is not visualized. No evidence of pulmonic stenosis. Aorta: The aortic root is normal in size and structure. Venous: The inferior vena cava is normal in size with greater than 50% respiratory variability, suggesting right atrial pressure of 3 mmHg. IAS/Shunts: No atrial level shunt detected by color flow Doppler.  LEFT VENTRICLE PLAX 2D LVIDd:         5.00 cm     Diastology LVIDs:         3.60 cm     LV e' medial:    7.83 cm/s LV PW:         1.10 cm     LV E/e' medial:  10.9 LV IVS:        1.00 cm     LV e' lateral:   8.05 cm/s LVOT diam:      2.10 cm     LV E/e' lateral: 10.6 LV SV:         63 LV SV Index:   40          2D Longitudinal Strain LVOT Area:     3.46 cm    2D Strain GLS Avg:     -13.8 %  LV Volumes (MOD) LV vol d, MOD A2C: 98.3 ml LV vol d, MOD A4C: 69.1 ml LV vol s, MOD A2C: 43.5 ml LV vol s, MOD A4C: 32.8 ml LV SV MOD A2C:     54.8 ml LV SV MOD A4C:     69.1 ml LV SV MOD BP:      45.3 ml RIGHT VENTRICLE             IVC RV S prime:     12.20 cm/s  IVC diam: 1.80 cm RVOT diam:      2.55 cm TAPSE (M-mode): 2.6 cm LEFT ATRIUM             Index        RIGHT ATRIUM           Index LA diam:        3.60 cm 2.27 cm/m   RA Area:     20.20 cm LA Vol (A2C):   80.5 ml 50.74 ml/m  RA Volume:   62.10 ml  39.14 ml/m LA Vol (A4C):   31.7 ml 19.98 ml/m LA Biplane Vol: 52.2 ml 32.90 ml/m  AORTIC VALVE                    PULMONIC VALVE AV Area (Vmax):    3.36 cm     PV Vmax:       0.72 m/s AV Area (Vmean):   2.87 cm     PV Peak grad:  2.1 mmHg AV Area (VTI):     3.06 cm AV Vmax:           101.00 cm/s AV Vmean:          74.900 cm/s AV VTI:            0.205 m AV Peak Grad:      4.1 mmHg AV Mean Grad:      2.0 mmHg LVOT Vmax:         97.90 cm/s LVOT Vmean:        62.100 cm/s LVOT VTI:          0.181 m LVOT/AV VTI ratio: 0.88 AR Vena Contracta: 0.20 cm  AORTA Ao Root diam: 3.30 cm Ao Asc diam:  3.20 cm Ao Arch diam: 2.5 cm MITRAL VALVE               TRICUSPID VALVE MV Area (PHT): 4.99 cm    TR Peak grad:   22.7 mmHg MV Decel Time: 152 msec    TR Vmax:        238.00 cm/s MV E velocity: 85.70 cm/s MV A velocity: 58.30 cm/s  SHUNTS MV E/A ratio:  1.47        Systemic VTI:  0.18 m                            Systemic Diam: 2.10 cm                            Pulmonic Diam: 2.55 cm Ida Rogue MD Electronically signed by Ida Rogue MD Signature Date/Time: 10/14/2021/4:45:02 PM    Final     ASSESSMENT & PLAN:   Cancer Staging  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 Hold treatment due to uncontrolled hypertension. CT images were reviewed and discussed with patient. New RIGHT pleural effusion and new mild nodularity along the RIGHT oblique fissure CA125 gradually uptrending. Now 31.3; previously 13. Echo showed normal LVEF.  Persistent lower extremity edema. Cancer progression most likely given new pleural effusion, new nodule in right oblique fissue however, otherwise stable disease.  Discussed about possible need of diagnostic thoracentesis.  She agrees with the plan   Anemia due to antineoplastic chemotherapy Patient has been off chemotherapy for 6 weeks. She is relatively asymptomatic. Chemotherapy is held.  Close monitor.  There may be an effect of hemo dilution due to edema.   Proteinuria Continue hold bevacizumab.  24 hour urine was elevated. Awaiting appt with Dr Candiss Norse 02/09/22.   Hypertension- grade 2 Uncontrolled hypertension.  Due to bilateral lower extremity swelling, amlodipine was discontinued at the last visit.   Recommend patient to further increase losartan to 48m daily. Increase metoprolol to 50 mg twice a day. Prescription sent to her mail order pharmacy at her request. In interim, she  will take 2 of the 25 mg tablets that she has on hand twice a day (total of 4 tablets a day).  Lasix to 40 mg daily. And to keep blood pressure log and update Dr Tasia Catchings next week.  Last bev was April 2023.  Refer to cardiology for assistance in managing her blood pressure.   Hypothyroidism TSH 22.325 on 12/30/21.  She has tried to reach pcp for adjustment of medication unsuccessfully. She will stop by their office today and if unable to contact, I will increase levothyroxine to 125 mcg daily.   Follow-up  Lab MDDoxil in 1 week  All questions were answered. The patient knows  to call the clinic with any problems, questions or concerns.  Thank you for allowing me to participate in the care of this very pleasant patient.   Beckey Rutter, DNP, AGNP-C [facility] [phone] (clinic) 01/06/2022

## 2022-01-12 MED FILL — Dexamethasone Sodium Phosphate Inj 100 MG/10ML: INTRAMUSCULAR | Qty: 1 | Status: AC

## 2022-01-13 ENCOUNTER — Inpatient Hospital Stay: Payer: Medicare Other

## 2022-01-13 ENCOUNTER — Ambulatory Visit
Admission: RE | Admit: 2022-01-13 | Discharge: 2022-01-13 | Disposition: A | Discharge status: Home / Self Care | Payer: Medicare Other | Source: Ambulatory Visit | Attending: Oncology | Admitting: Oncology

## 2022-01-13 ENCOUNTER — Ambulatory Visit: Payer: Medicare Other | Admitting: Oncology

## 2022-01-13 ENCOUNTER — Encounter: Payer: Self-pay | Admitting: Oncology

## 2022-01-13 ENCOUNTER — Inpatient Hospital Stay (HOSPITAL_BASED_OUTPATIENT_CLINIC_OR_DEPARTMENT_OTHER): Payer: Medicare Other | Admitting: Oncology

## 2022-01-13 ENCOUNTER — Inpatient Hospital Stay: Payer: Medicare Other | Attending: Oncology

## 2022-01-13 ENCOUNTER — Ambulatory Visit: Payer: Medicare Other

## 2022-01-13 ENCOUNTER — Other Ambulatory Visit: Payer: Medicare Other

## 2022-01-13 VITALS — BP 189/92 | HR 62 | Temp 97.9°F | Resp 17 | Wt 118.0 lb

## 2022-01-13 DIAGNOSIS — C569 Malignant neoplasm of unspecified ovary: Secondary | ICD-10-CM | POA: Insufficient documentation

## 2022-01-13 DIAGNOSIS — R600 Localized edema: Secondary | ICD-10-CM | POA: Insufficient documentation

## 2022-01-13 DIAGNOSIS — C561 Malignant neoplasm of right ovary: Secondary | ICD-10-CM | POA: Diagnosis not present

## 2022-01-13 DIAGNOSIS — R519 Headache, unspecified: Secondary | ICD-10-CM | POA: Insufficient documentation

## 2022-01-13 DIAGNOSIS — R808 Other proteinuria: Secondary | ICD-10-CM

## 2022-01-13 DIAGNOSIS — R809 Proteinuria, unspecified: Secondary | ICD-10-CM | POA: Diagnosis not present

## 2022-01-13 DIAGNOSIS — M7989 Other specified soft tissue disorders: Secondary | ICD-10-CM | POA: Insufficient documentation

## 2022-01-13 DIAGNOSIS — I10 Essential (primary) hypertension: Secondary | ICD-10-CM

## 2022-01-13 DIAGNOSIS — Z452 Encounter for adjustment and management of vascular access device: Secondary | ICD-10-CM | POA: Diagnosis not present

## 2022-01-13 LAB — CBC WITH DIFFERENTIAL/PLATELET
Abs Immature Granulocytes: 0.02 10*3/uL (ref 0.00–0.07)
Basophils Absolute: 0 10*3/uL (ref 0.0–0.1)
Basophils Relative: 1 %
Eosinophils Absolute: 0.2 10*3/uL (ref 0.0–0.5)
Eosinophils Relative: 4 %
HCT: 26.3 % — ABNORMAL LOW (ref 36.0–46.0)
Hemoglobin: 8.7 g/dL — ABNORMAL LOW (ref 12.0–15.0)
Immature Granulocytes: 1 %
Lymphocytes Relative: 25 %
Lymphs Abs: 1 10*3/uL (ref 0.7–4.0)
MCH: 35.5 pg — ABNORMAL HIGH (ref 26.0–34.0)
MCHC: 33.1 g/dL (ref 30.0–36.0)
MCV: 107.3 fL — ABNORMAL HIGH (ref 80.0–100.0)
Monocytes Absolute: 0.3 10*3/uL (ref 0.1–1.0)
Monocytes Relative: 9 %
Neutro Abs: 2.3 10*3/uL (ref 1.7–7.7)
Neutrophils Relative %: 60 %
Platelets: 117 10*3/uL — ABNORMAL LOW (ref 150–400)
RBC: 2.45 MIL/uL — ABNORMAL LOW (ref 3.87–5.11)
RDW: 14.4 % (ref 11.5–15.5)
WBC: 3.8 10*3/uL — ABNORMAL LOW (ref 4.0–10.5)
nRBC: 0 % (ref 0.0–0.2)

## 2022-01-13 LAB — COMPREHENSIVE METABOLIC PANEL
ALT: 26 U/L (ref 0–44)
AST: 46 U/L — ABNORMAL HIGH (ref 15–41)
Albumin: 3.7 g/dL (ref 3.5–5.0)
Alkaline Phosphatase: 61 U/L (ref 38–126)
Anion gap: 5 (ref 5–15)
BUN: 25 mg/dL — ABNORMAL HIGH (ref 8–23)
CO2: 24 mmol/L (ref 22–32)
Calcium: 9.2 mg/dL (ref 8.9–10.3)
Chloride: 107 mmol/L (ref 98–111)
Creatinine, Ser: 1.08 mg/dL — ABNORMAL HIGH (ref 0.44–1.00)
GFR, Estimated: 56 mL/min — ABNORMAL LOW (ref >60)
Glucose, Bld: 94 mg/dL (ref 70–99)
Potassium: 3.4 mmol/L — ABNORMAL LOW (ref 3.5–5.1)
Sodium: 136 mmol/L (ref 135–145)
Total Bilirubin: 0.7 mg/dL (ref 0.3–1.2)
Total Protein: 6.5 g/dL (ref 6.5–8.1)

## 2022-01-13 LAB — PROTEIN, URINE, RANDOM: Total Protein, Urine: 101 mg/dL

## 2022-01-13 MED ORDER — HEPARIN SOD (PORK) LOCK FLUSH 100 UNIT/ML IV SOLN
500.0000 [IU] | Freq: Once | INTRAVENOUS | Status: AC
  Administered 2022-01-13: 500 [IU] via INTRAVENOUS
  Filled 2022-01-13: qty 5

## 2022-01-13 MED ORDER — LOSARTAN POTASSIUM 25 MG PO TABS: 25.0000 mg | ORAL_TABLET | Freq: Every day | ORAL | 0 refills | Status: DC

## 2022-01-13 MED ORDER — LOSARTAN POTASSIUM 50 MG PO TABS: 50.0000 mg | ORAL_TABLET | Freq: Every day | ORAL | 0 refills | Status: DC

## 2022-01-13 NOTE — Progress Notes (Signed)
Hematology/Oncology Progress note [phone] [phone]         Patient Care Team: Cletis Athens, MD as PCP - General (Internal Medicine) Clent Jacks, [name], [name], [name], MD as Referring Physician (Obstetrics and Gynecology)  ASSESSMENT & PLAN:   Cancer Staging  Malignant neoplasm of ovary [facility]) Staging form: Ovary, Fallopian Tube, and Primary Peritoneal Carcinoma, AJCC 8th Edition - Clinical stage from 11/22/2016: Stage IIIC (cT3c, cN1b, cM0) - Signed by Earlie Server, MD on 11/22/2016   Malignant neoplasm of ovary [facility]) Labs reviewed and discussed with patient. Hold treatment due to uncontrolled hypertension. CT images were reviewed and discussed with patient. New RIGHT pleural effusion and new mild nodularity along the RIGHT oblique fissure CA125 gradually increases.   Echo showed normal LVEF.  Persistent lower extremity edema, poorly uncontrolled HTN, proteinuria Hold of chemotherapy for now.    Leg swelling STAT US lower extremities bilaterally - negative.  Repeat Echo.  Lasix 64m daily PRN edema.    Proteinuria She has bee off bevacizumab.  24-hour urine protein 8018m Discussed case with nephrology.  She will be seen by Dr. SiCandiss Norseor further evaluation.    Headache probably due to uncontrolled HTN.  Check brain CT.   Hypertension Uncontrolled hypertension, bilateral lower extremity edema Due to bilateral lower extremity swelling, amlodipine was discontinued at the last visit.   Continue losartan to 7526maily, metoprolol 45m74mD Laxis 40mg67mly Appreciate nephrology recommendation     Orders Placed This Encounter  Procedures   CT HEAD WO CONTRAST (5MM)    Standing Status:   Future    Number of Occurrences:   1    Standing Expiration Date:   01/14/2023    Order Specific Question:    Preferred imaging location?    Answer:   [facility]    Order Specific Question:   Release to patient    Answer:   Immediate    Order Specific Question:   Call Results- Best Contact Number?    Answer:   [phone]-up  TBD, I ask patient to call office once she got appt with nephrology  All questions were answered. The patient knows to call the clinic with any problems, questions or concerns.  Levi Klaiber Earlie Server PhD [facility] H[facility] Oncology 01/13/2022     CHIEF COMPLAINTS/PURPOSE OF Visit Follow up for chemotherapy for ovarian cancer.  HISTORY OF PRESENTING ILLNESS: Katie Hill.71 female presents for follow up of management of recurrent  ovarian cancer. Oncology History  Malignant neoplasm of ovary (HCC) [place] Imaging   CT chest abdomen pelvis w contrast 1. No significant interval changed compared with the previous exam.2. Stable appearance of soft tissue attenuating lesion in the posterolateral right hemipelvis. This may represent a treated site of peritoneal disease.3. Unchanged appearance of borderline enlarged left periaortic and aortocaval retroperitoneal lymph nodes.4. Unchanged 6 mm left lower lobe lung nodule. 5. No new signs of metastatic disease within the chest, abdomen or pelvis. 6. Aortic Atherosclerosis (ICD10-I70.0) and Emphysema    11/20/2016 Initial Diagnosis   Malignant neoplasm of ovary   Pathology 11/16/2016 Surgical Pathology  CASE: [id number]  PATIENT: Katie Hill  Surgical Pathology Report   SPECIMEN SUBMITTED:  A. Retroperitoneal adenopathy, left  DIAGNOSIS:  A. LYMPH NODE, LEFT RETROPERITONEAL; CT-GUIDED CORE BIOPSY:  - METASTATIC HIGH-GRADE SEROUS CARCINOMA.  Pathology 03/14/2017  DIAGNOSIS:  A. OMENTUM; OMENTECTOMY:  - NO TUMOR SEEN.  - ONE NEGATIVE LYMPH NODE (0/1).   B.  RIGHT FALLOPIAN TUBE AND OVARY; SALPINGO-OOPHORECTOMY:  - SMALL FOCI OF HIGH GRADE SEROUS CARCINOMA INVOLVING THE OVARY.  -  MARKED THERAPY RELATED CHANGE.  - NO TUMOR SEEN IN THE FALLOPIAN TUBE.   C.  UTERUS, CERVIX, LEFT FALLOPIAN TUBE AND OVARY; HYSTERECTOMY AND LEFT  SALPINGO-OOPHORECTOMY:  - NABOTHIAN CYSTS.  - CYSTIC ATROPHY OF THE ENDOMETRIUM.  - SEROSAL ADHESIONS.  - UNREMARKABLE FALLOPIAN TUBE.  - OVARY SHOWING TREATMENT RELATED CHANGE.   D.  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    12/01/2016 -  Chemotherapy   Carboplatin and taxol x 4 neoadjuvant chemotherapy    03/14/2017 Surgery   Patient had debulking surgery on 03/14/2017. She had a laparoscopy with conversion to laparotomy, total hysterectomy, with bilateral salpingo oophorectomy, right aortic lymph node dissection, omentectomy. Pathology showed small foci of residual disease in ovary.   HRD positive, declined Olarparib maintenance trial    03/28/2017 -  Chemotherapy   Adjuvant carbo and taxol x3 cycles   02/28/2018 Progression   Local Recurrence. CA125 146 CT abdomen pelvis w contrast showed interval enlargement of an aortocaval lymph node which currently measures 1.4 cm in short axis (axial image 75 of series 2). This lymph node is in close proximity to the previously resected retroperitoneal lymphadenopathy and is very concerning for an additional nodal metastasis.    03/15/2018 -  Chemotherapy   03/15/2018-05/17/2018 Carboplatin and Taxol x 4.  CA125 decrease from 49.7 to 6 after 4 cycles of treatment.  05/17/2018-10/29/20   Olarparib 336m BID    11/05/2018 Imaging   MRI abdomenPelvis on 11/05/2018 showed Interval progression of, now measuring 2.7 x 2.3 cm and concerning for progression of metastatic disease.retrocaval lymph node in the abdomen Olaparib was held for short period of time. Her CA125 trended down again.  Dr. BFransisca Connorsrecommending resume olaparib and continue monitor.   10/06/2020 Progression    CT chest abdomen pelvis with contrast showed new  lymph nodes in the prevascular space of the upper anterior mediastinum most consistent with metastasis.  Single new right lower lobe pulmonary nodule is concerning for early pulmonary metastasis.  Interval enlargement of the left periaortic retroperitoneal lymph node.  Stable adjacent aorto caval adenopathy.    Genetic Testing   Genetic testing negative for 83 genes on [facility]'s Multi-Cancer panel (ALK, APC, ATM, AXIN2, BAP1, BARD1, BLM, BMPR1A, BRCA1, BRCA2, BRIP1, CASR, CDC73, CDH1, CDK4, CDKN1B, CDKN1C, CDKN2A, CEBPA, CHEK2, CTNNA1, DICER1, DIS3L2, EGFR, EPCAM, FH, FLCN, GATA2, GPC3, GREM1, HOXB13, HRAS, KIT, MAX, MEN1, MET, MITF, MLH1, MSH2, MSH3, MSH6, MUTYH, NBN, NF1, NF2, NTHL1, PALB2, PDGFRA, PHOX2B, PMS2, POLD1, POLE, POT1, PRKAR1A, PTCH1, PTEN, RAD50, RAD51C, RAD51D, RB1, RECQL4, RET, RUNX1, SDHA, SDHAF2, SDHB, SDHC, SDHD, SMAD4, SMARCA4, SMARCB1, SMARCE1, STK11, SUFU, TERC, TERT, TMEM127, TP53, TSC1, TSC2, VHL, WRN, WT1).   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  Myraid testing negative for somatic BRACA1/2, positive for HRD.    11/03/2020 -  Chemotherapy   Liposomal Doxorubicin + Bevacizumab q28d   11/03/20 -05/20/21 Doxil + Bevacizumab q28d  06/03/21 Doxil only due to proteinuria.  07/01/21-07/15/21  Resumed on Doxil + Bevacizumab 07/29/21 Doxil only due to proteinuria.    10/14/2021 Echocardiogram    Left ventricular ejection fraction, by estimation, is 55 to 60%   12/16/2021 Imaging   UKorealower extremities  1. No evidence of deep venous thrombosis in either lower extremity. 2. 1.9 cm left popliteal fossa Baker's cyst.   12/23/2021 Echocardiogram   LVEF 55%   12/26/2021 Imaging   CT chest abdomen pelvis w contrast   Chest Impression:   1. New RIGHT pleural effusion and new mild nodularity along the RIGHT oblique fissure. Recommend close attention on follow-up for potential pleural metastasis in the RIGHT hemithorax. 2. Stable LEFT lobe pulmonary  nodule.3. No mediastinal lymphadenopathy.   Abdomen / Pelvis Impression:   1. Stable soft tissue lesion in the deep RIGHT pelvis. 2. Mass stable small periaortic lymph nodes. 3. No intraperitoneal free fluid the abdomen or pelvis.   High grade ovarian cancer [facility])    INTERVAL HISTORY Katie Hill is a 71 y.o. female who has above history reviewed by me today presents for follow up visit for recurrent Ovarian cancer Patient was accompanied by her husband.   She takes losartan 22m daily, metoprolol 520mBID, lasix 402maily, + headache. No nausea, vomiting, diarrhea She feels well. Chronic fatigue, manageable  . Review of Systems  Constitutional:  Positive for fatigue. Negative for appetite change, chills and fever.  HENT:   Negative for hearing loss and voice change.   Eyes:  Negative for eye problems.  Respiratory:  Negative for chest tightness and cough.   Cardiovascular:  Negative for chest pain and leg swelling.  Gastrointestinal:  Negative for abdominal distention, abdominal pain and blood in stool.  Endocrine: Negative for hot flashes.  Genitourinary:  Negative for difficulty urinating and frequency.   Musculoskeletal:  Negative for arthralgias.  Skin:  Negative for itching and rash.  Neurological:  Negative for extremity weakness.  Hematological:  Negative for adenopathy.  Psychiatric/Behavioral:  Negative for confusion.      . MMarland KitchenDICAL HISTORY: Past Medical History:  Diagnosis Date   Dysrhythmia    Genetic testing 03/28/2017   Multi-Cancer panel (83 genes) @ [facility] - No pathogenic mutations detected   [place]   Pelvic mass in female     SURGICAL HISTORY: Past Surgical History:  Procedure Laterality Date   APPENDECTOMY     LAPAROSCOPY N/A 03/14/2017   Procedure: LAPAROSCOPY OPERATIVE;  Surgeon: BerMellody DrownD;  Location: [facility] ORS;  Service: Gynecology;  Laterality: N/A;   LAPAROTOMY N/A 03/14/2017   Procedure:  LAPAROTOMY;  Surgeon: BerMellody DrownD;  Location: [facility] ORS;  Service: Gynecology;  Laterality: N/A;   LYMPH NODE DISSECTION N/A 03/14/2017   Procedure: LYMPH NODE DISSECTION;  Surgeon: BerMellody DrownD;  Location: [facility] ORS;  Service: Gynecology;  Laterality: N/A;   OMENTECTOMY N/A 03/14/2017   Procedure: OMENTECTOMY;  Surgeon: BerMellody DrownD;  Location: [facility] ORS;  Service: Gynecology;  Laterality: N/A;   PORTA CATH INSERTION N/A 11/27/2016   Procedure: PorGlori Luisth Insertion;  Surgeon: DewAlgernon HuxleyD;  [facility];  Service: Cardiovascular;  Laterality: N/A;    SOCIAL HISTORY: Social History   Tobacco Use   Smoking status: Never   Smokeless tobacco: Never  Vaping Use   Vaping Use: Never used  Substance Use Topics   Alcohol use: Not Currently   Drug use: No     FAMILY HISTORY Family History  Problem Relation Age of Onset   Throat cancer Cousin    Throat cancer Cousin    Leukemia Cousin     ALLERGIES:  is allergic to omeprazole.  MEDICATIONS:  Current Outpatient Medications  Medication Sig Dispense Refill  furosemide (LASIX) 40 MG tablet Take 1 tablet (40 mg total) by mouth daily. 10 tablet 0   furosemide (LASIX) 40 MG tablet Take 1 tablet (40 mg total) by mouth daily. 30 tablet 1   levothyroxine (SYNTHROID) 100 MCG tablet Take 1 tablet (100 mcg total) by mouth daily. 90 tablet 3   lidocaine-prilocaine (EMLA) cream Apply 1 application topically as needed. Apply small amount to port site at least 1 hour prior to it being accessed, cover with plastic wrap 30 g 1   losartan (COZAAR) 25 MG tablet Take 1 tablet (25 mg total) by mouth daily. 30 tablet 0   losartan (COZAAR) 50 MG tablet Take 1 tablet (50 mg total) by mouth daily. 30 tablet 0   metoprolol tartrate (LOPRESSOR) 50 MG tablet Take 1 tablet (50 mg total) by mouth 2 (two) times daily. 60 tablet 0   ondansetron (ZOFRAN) 8 MG tablet Take 1 tablet (8 mg total) by mouth every 8 (eight) hours as  needed for nausea or vomiting. 60 tablet 1   No current facility-administered medications for this visit.   Facility-Administered Medications Ordered in Other Visits  Medication Dose Route Frequency Provider Last Rate Last Admin   pegfilgrastim (NEULASTA ONPRO KIT) 6 MG/0.6ML injection             PHYSICAL EXAMINATION:  ECOG PERFORMANCE STATUS: 0 - Asymptomatic Vitals:   01/13/22 0902  BP: (!) 189/92  Pulse: 62  Resp: 17  Temp: 97.9 F (36.6 C)  SpO2: 100%    Filed Weights   01/13/22 0902  Weight: 118 lb (53.5 kg)     Physical Exam Constitutional:      General: She is not in acute distress.    Appearance: She is not diaphoretic.     Comments: Thin built, she walks independantly  HENT:     Head: Normocephalic and atraumatic.     Nose: Nose normal.     Mouth/Throat:     Pharynx: No oropharyngeal exudate.  Eyes:     General: No scleral icterus.    Pupils: Pupils are equal, round, and reactive to light.  Neck:     Vascular: No JVD.  Cardiovascular:     Rate and Rhythm: Normal rate and regular rhythm.     Heart sounds: No murmur heard. Pulmonary:     Effort: Pulmonary effort is normal. No respiratory distress.     Breath sounds: Normal breath sounds. No rales.  Chest:     Chest wall: No tenderness.  Abdominal:     General: Bowel sounds are normal. There is no distension.     Palpations: Abdomen is soft.     Tenderness: There is no abdominal tenderness.  Musculoskeletal:        General: Normal range of motion.     Cervical back: Normal range of motion and neck supple.  Lymphadenopathy:     Cervical: No cervical adenopathy.  Skin:    General: Skin is warm and dry.     Findings: No erythema or rash.     Comments: Right anterior medi port +   Neurological:     Mental Status: She is alert and oriented to person, place, and time.     Cranial Nerves: No cranial nerve deficit.     Motor: No abnormal muscle tone.     Coordination: Coordination normal.   Psychiatric:        Mood and Affect: Affect normal.        Judgment: Judgment normal.  LABORATORY DATA: I have personally reviewed the data as listed:     Latest Ref Rng & Units 01/13/2022    8:45 AM 01/06/2022    9:24 AM 12/30/2021    8:35 AM  CBC  WBC 4.0 - 10.5 K/uL 3.8  4.4  3.8   Hemoglobin 12.0 - 15.0 g/dL 8.7  8.7  7.9   Hematocrit 36.0 - 46.0 % 26.3  26.2  23.9   Platelets 150 - 400 K/uL 117  118  127       Latest Ref Rng & Units 01/13/2022    8:45 AM 01/06/2022    9:24 AM 12/30/2021    8:35 AM  CMP  Glucose 70 - 99 mg/dL 94  124  112   BUN 8 - 23 mg/dL _0 Creatinine 0.44 - 1.00 mg/dL 1.08  1.33  0.97   Sodium 135 - 145 mmol/L 136  135  134   Potassium 3.5 - 5.1 mmol/L 3.4  3.5  3.6   Chloride 98 - 111 mmol/L 107  107  110   CO2 22 - 32 mmol/L _1 Calcium 8.9 - 10.3 mg/dL 9.2  9.2  9.1   Total Protein 6.5 - 8.1 g/dL 6.5  6.6  6.5   Total Bilirubin 0.3 - 1.2 mg/dL 0.7  0.6  0.6   Alkaline Phos 38 - 126 U/L 61  58  60   AST 15 - 41 U/L 46  30  22   ALT 0 - 44 U/L _2 RADIOGRAPHIC STUDIES: I have personally reviewed the radiological images as listed and agreed with the findings in the report. CT HEAD WO CONTRAST (5MM)  Result Date: 01/13/2022 CLINICAL DATA:  Headaches EXAM: CT HEAD WITHOUT CONTRAST TECHNIQUE: Contiguous axial images were obtained from the base of the skull through the vertex without intravenous contrast. RADIATION DOSE REDUCTION: This exam was performed according to the departmental dose-optimization program which includes automated exposure control, adjustment of the mA and/or kV according to patient size and/or use of iterative reconstruction technique. COMPARISON:  None Available. FINDINGS: Brain: No evidence of acute infarction, hemorrhage, hydrocephalus, extra-axial collection or mass lesion/mass effect. Vascular: No hyperdense vessel or unexpected calcification. Skull: No osseous abnormality. Sinuses/Orbits:  Visualized paranasal sinuses are clear. Visualized mastoid sinuses are clear. Visualized orbits demonstrate no focal abnormality. Other: None IMPRESSION: No acute intracranial findings. Electronically Signed   By: Kathreen Devoid M.D.   On: 01/13/2022 10:36   CT CHEST ABDOMEN PELVIS W CONTRAST  Result Date: 12/27/2021 CLINICAL DATA:  Ovarian cancer. Leg swelling. Hysterectomy 2018. Chemotherapy ongoing. * Tracking Code: BO * EXAM: CT CHEST, ABDOMEN, AND PELVIS WITH CONTRAST TECHNIQUE: Multidetector CT imaging of the chest, abdomen and pelvis was performed following the standard protocol during bolus administration of intravenous contrast. RADIATION DOSE REDUCTION: This exam was performed according to the departmental dose-optimization program which includes automated exposure control, adjustment of the mA and/or kV according to patient size and/or use of iterative reconstruction technique. CONTRAST:  16m OMNIPAQUE IOHEXOL 300 MG/ML  SOLN COMPARISON:  CT 09/19/2021 CT CHEST FINDINGS Cardiovascular: Port in the anterior chest wall with tip in distal SVC. No significant vascular findings. Normal heart size. No pericardial effusion. Mediastinum/Nodes: No axillary or supraclavicular adenopathy. No mediastinal or hilar adenopathy. No pericardial fluid. Esophagus normal. Lungs/Pleura: New moderate size layering RIGHT pleural effusion. LEFT lobe pulmonary nodule measures 6 mm (image  99/4) is unchanged. New nodule along the RIGHT oblique fissure measures 4 mm (image 61/4). Subtle pleural thickening more medially along the RIGHT oblique fissure is more prominent on image 75/4. Musculoskeletal: No aggressive osseous lesion. CT ABDOMEN AND PELVIS FINDINGS Hepatobiliary: No focal hepatic lesion. No biliary ductal dilatation. Gallbladder is normal. Common bile duct is normal. Pancreas: Pancreas is normal. No ductal dilatation. No pancreatic inflammation. Spleen: Normal spleen Adrenals/urinary tract: Adrenal glands and kidneys  are normal. The ureters and bladder normal. Stomach/Bowel: Stomach, small bowel, appendix, and cecum are normal. The colon and rectosigmoid colon are normal. Vascular/Lymphatic: Abdominal aorta is normal caliber. Small periaortic lymph nodes again noted. Largest focus lymphoid tissue measures 10 mm on image 69/2 not changed from prior. No new lymphadenopathy abdomen pelvis. Reproductive: Post hysterectomy anatomy. Other: soft tissue lesion in the posterior RIGHT pelvis anterior to the sacrum measures 14 x 29 mm (image 109/2) compared to 14 mm x 30 mm. Visually lesion looks similar. No new peritoneal implants Musculoskeletal: No aggressive osseous lesion. IMPRESSION: Chest Impression: 1. New RIGHT pleural effusion and new mild nodularity along the RIGHT oblique fissure. Recommend close attention on follow-up for potential pleural metastasis in the RIGHT hemithorax. 2. Stable LEFT lobe pulmonary nodule. 3. No mediastinal lymphadenopathy. Abdomen / Pelvis Impression: 1. Stable soft tissue lesion in the deep RIGHT pelvis. 2. Mass stable small periaortic lymph nodes. 3. No intraperitoneal free fluid the abdomen or pelvis. Electronically Signed   By: Suzy Bouchard M.D.   On: 12/27/2021 10:11   ECHOCARDIOGRAM COMPLETE  Result Date: 12/23/2021    ECHOCARDIOGRAM REPORT   Patient Name:   Katie Hill Date of Exam: 12/23/2021 Medical Rec #:  [id number]        Height:       70.0 in Accession #:    [id number]       Weight:       116.8 lb Date of Birth:  May 18, 1950        BSA:          1.661 m Patient Age:    66 years         BP:           170/83 mmHg Patient Gender: F                HR:           79 bpm. Exam Location:  [facility] Procedure: 2D Echo, Cardiac Doppler, Color Doppler and Strain Analysis Indications:     Z51.81 Z79.899                  Encounter for monitoring cardiotoxic drug therapy  History:         Patient has prior history of Echocardiogram examinations, most                  recent 10/14/2021. Risk  Factors:Hypertension.  Sonographer:     Sherrie Sport Referring Phys:  Earlie Server Diagnosing Phys: Ida Rogue MD  Sonographer Comments: Global longitudinal strain was attempted. IMPRESSIONS  1. Left ventricular ejection fraction, by estimation, is 55%. The left ventricle has normal function. The left ventricle has no regional wall motion abnormalities. The average left ventricular global longitudinal strain is -13.5 %.  2. Right ventricular systolic function is normal. The right ventricular size is normal.  3. The mitral valve is normal in structure. Mild mitral valve regurgitation. No evidence of mitral stenosis.  4. Tricuspid valve regurgitation is mild to moderate.  5.  The aortic valve is tricuspid. Aortic valve regurgitation is trivial. No aortic stenosis is present.  6. The inferior vena cava is normal in size with greater than 50% respiratory variability, suggesting right atrial pressure of 3 mmHg. FINDINGS  Left Ventricle: Left ventricular ejection fraction, by estimation, is 55%. The left ventricle has normal function. The left ventricle has no regional wall motion abnormalities. The average left ventricular global longitudinal strain is -13.5 %. The left  ventricular internal cavity size was normal in size. There is no left ventricular hypertrophy. Left ventricular diastolic parameters are indeterminate. Right Ventricle: The right ventricular size is normal. No increase in right ventricular wall thickness. Right ventricular systolic function is normal. Left Atrium: Left atrial size was normal in size. Right Atrium: Right atrial size was normal in size. Pericardium: There is no evidence of pericardial effusion. Mitral Valve: The mitral valve is normal in structure. Mild mitral valve regurgitation. No evidence of mitral valve stenosis. Tricuspid Valve: The tricuspid valve is normal in structure. Tricuspid valve regurgitation is mild to moderate. No evidence of tricuspid stenosis. Aortic Valve: The aortic valve  is tricuspid. Aortic valve regurgitation is trivial. No aortic stenosis is present. Aortic valve mean gradient measures 2.0 mmHg. Aortic valve peak gradient measures 4.3 mmHg. Aortic valve area, by VTI measures 2.38 cm. Pulmonic Valve: The pulmonic valve was normal in structure. Pulmonic valve regurgitation is not visualized. No evidence of pulmonic stenosis. Aorta: The aortic root is normal in size and structure. Venous: The inferior vena cava is normal in size with greater than 50% respiratory variability, suggesting right atrial pressure of 3 mmHg. IAS/Shunts: No atrial level shunt detected by color flow Doppler.  LEFT VENTRICLE PLAX 2D LVIDd:         5.00 cm LVIDs:         3.75 cm   2D Longitudinal Strain LV PW:         1.00 cm   2D Strain GLS Avg:     -13.5 % LV IVS:        0.90 cm LVOT diam:     2.10 cm LV SV:         48 LV SV Index:   29 LVOT Area:     3.46 cm  RIGHT VENTRICLE RV Basal diam:  3.60 cm RV Mid diam:    3.20 cm RV S prime:     13.60 cm/s TAPSE (M-mode): 2.8 cm LEFT ATRIUM             Index        RIGHT ATRIUM           Index LA diam:        3.50 cm 2.11 cm/m   RA Area:     22.90 cm LA Vol (A2C):   38.8 ml 23.36 ml/m  RA Volume:   74.30 ml  44.74 ml/m LA Vol (A4C):   34.4 ml 20.71 ml/m LA Biplane Vol: 37.1 ml 22.34 ml/m  AORTIC VALVE AV Area (Vmax):    2.32 cm AV Area (Vmean):   2.24 cm AV Area (VTI):     2.38 cm AV Vmax:           104.00 cm/s AV Vmean:          70.600 cm/s AV VTI:            0.204 m AV Peak Grad:      4.3 mmHg AV Mean Grad:      2.0 mmHg LVOT  Vmax:         69.70 cm/s LVOT Vmean:        45.600 cm/s LVOT VTI:          0.140 m LVOT/AV VTI ratio: 0.69  AORTA Ao Root diam: 3.25 cm MITRAL VALVE               TRICUSPID VALVE MV Area (PHT): 4.10 cm    TR Peak grad:   21.0 mmHg MV Decel Time: 185 msec    TR Vmax:        229.00 cm/s MV E velocity: 83.30 cm/s MV A velocity: 53.50 cm/s  SHUNTS MV E/A ratio:  1.56        Systemic VTI:  0.14 m                            Systemic  Diam: 2.10 cm Ida Rogue MD Electronically signed by Ida Rogue MD Signature Date/Time: 12/23/2021/10:04:27 AM    Final    US Venous Img Lower Bilateral  Result Date: 12/16/2021 CLINICAL DATA:  Bilateral leg swelling. EXAM: BILATERAL LOWER EXTREMITY VENOUS DOPPLER ULTRASOUND TECHNIQUE: Gray-scale sonography with compression, as well as color and duplex ultrasound, were performed to evaluate the deep venous system(s) from the level of the common femoral vein through the popliteal and proximal calf veins. COMPARISON:  None Available. FINDINGS: VENOUS Normal compressibility of the common femoral, superficial femoral, and popliteal veins, as well as the visualized calf veins. Visualized portions of profunda femoral vein and great saphenous vein unremarkable. No filling defects to suggest DVT on grayscale or color Doppler imaging. Doppler waveforms show normal direction of venous flow, normal respiratory plasticity and response to augmentation. OTHER 1.9 x 0.8 x 0.6 cm fluid collection left popliteal fossa suggest Baker's cyst. Limitations: none IMPRESSION: 1. No evidence of deep venous thrombosis in either lower extremity. 2. 1.9 cm left popliteal fossa Baker's cyst. Electronically Signed   By: Misty Stanley M.D.   On: 12/16/2021 11:48   VAS US CAROTID  Result Date: 11/18/2021 Carotid Arterial Duplex Study Patient Name:  Katie Hill  Date of Exam:   11/15/2021 Medical Rec #: [id number]         Accession #:    [id number] Date of Birth: 1950/06/18         Patient Gender: F Patient Age:   58 years Exam Location:  [facility] Procedure:      VAS US CAROTID Referring Phys: Eulogio Ditch --------------------------------------------------------------------------------  Indications: Left bruit. Performing Technologist: Almira Coaster RVS  Examination Guidelines: A complete evaluation includes B-mode imaging, spectral Doppler, color Doppler, and power Doppler as needed of all accessible portions of  each vessel. Bilateral testing is considered an integral part of a complete examination. Limited examinations for reoccurring indications may be performed as noted.  Right Carotid Findings: +----------+--------+--------+--------+------------------+--------+           PSV cm/sEDV cm/sStenosisPlaque DescriptionComments +----------+--------+--------+--------+------------------+--------+ CCA Prox  96      12                                         +----------+--------+--------+--------+------------------+--------+ CCA Mid   72      15                                         +----------+--------+--------+--------+------------------+--------+  CCA Distal53      13                                         +----------+--------+--------+--------+------------------+--------+ ICA Prox  52      12                                         +----------+--------+--------+--------+------------------+--------+ ICA Mid   99      32                                         +----------+--------+--------+--------+------------------+--------+ ICA Distal95      29                                         +----------+--------+--------+--------+------------------+--------+ ECA       58      0                                          +----------+--------+--------+--------+------------------+--------+ +----------+--------+-------+--------+-------------------+           PSV cm/sEDV cmsDescribeArm Pressure (mmHG) +----------+--------+-------+--------+-------------------+ [id number]      0                                  +----------+--------+-------+--------+-------------------+ +---------+--------+--+--------+--+ VertebralPSV cm/s50EDV cm/s13 +---------+--------+--+--------+--+  Left Carotid Findings: +----------+--------+--------+--------+------------------+--------+           PSV cm/sEDV cm/sStenosisPlaque DescriptionComments  +----------+--------+--------+--------+------------------+--------+ CCA Prox  83      18                                         +----------+--------+--------+--------+------------------+--------+ CCA Mid   92      22                                         +----------+--------+--------+--------+------------------+--------+ CCA Distal70      21                                         +----------+--------+--------+--------+------------------+--------+ ICA Prox  37      13                                         +----------+--------+--------+--------+------------------+--------+ ICA Mid   83      22                                         +----------+--------+--------+--------+------------------+--------+ ICA Distal99  21                                         +----------+--------+--------+--------+------------------+--------+ ECA       55      7                                          +----------+--------+--------+--------+------------------+--------+ +----------+--------+--------+--------+-------------------+           PSV cm/sEDV cm/sDescribeArm Pressure (mmHG) +----------+--------+--------+--------+-------------------+ Subclavian101     0                                   +----------+--------+--------+--------+-------------------+ +---------+--------+--+--------+--+ VertebralPSV cm/s44EDV cm/s13 +---------+--------+--+--------+--+   Summary: Right Carotid: Velocities in the right ICA are consistent with a 1-39% stenosis. Left Carotid: Velocities in the left ICA are consistent with a 1-39% stenosis. Vertebrals: Bilateral vertebral arteries demonstrate antegrade flow. *See table(s) above for measurements and observations.  Electronically signed by Leotis Pain MD on 11/18/2021 at 9:46:40 AM.    Final

## 2022-01-13 NOTE — Progress Notes (Signed)
Patient here for oncology follow-up appointment, concerns of daily headaches

## 2022-01-13 NOTE — Assessment & Plan Note (Addendum)
She has bee off bevacizumab.  24-hour urine protein 865mg Discussed case with nephrology.  She will be seen by Dr. SCandiss Norsefor further evaluation.

## 2022-01-13 NOTE — Progress Notes (Unsigned)
No treatment today, per MD. Patient discharged, stable

## 2022-01-13 NOTE — Assessment & Plan Note (Addendum)
Uncontrolled hypertension, bilateral lower extremity edema Due to bilateral lower extremity swelling, amlodipine was discontinued at the last visit.   Continue losartan to '75mg'$  daily, metoprolol '50mg'$  BID Laxis '40mg'$  daily Appreciate nephrology recommendation

## 2022-01-13 NOTE — Assessment & Plan Note (Addendum)
Labs reviewed and discussed with patient. Hold treatment due to uncontrolled hypertension. CT images were reviewed and discussed with patient. New RIGHT pleural effusion and new mild nodularity along the RIGHT oblique fissure CA125 gradually increases.   Echo showed normal LVEF.  Persistent lower extremity edema, poorly uncontrolled HTN, proteinuria Hold of chemotherapy for now.

## 2022-01-13 NOTE — Assessment & Plan Note (Signed)
STAT US lower extremities bilaterally - negative.  Repeat Echo.  Lasix '40mg'$  daily PRN edema.

## 2022-01-13 NOTE — Assessment & Plan Note (Signed)
probably due to uncontrolled HTN.  Check brain CT.

## 2022-01-20 ENCOUNTER — Encounter: Payer: Self-pay | Admitting: Intensive Care

## 2022-01-20 ENCOUNTER — Other Ambulatory Visit: Payer: Self-pay

## 2022-01-20 ENCOUNTER — Inpatient Hospital Stay
Admission: EM | Admit: 2022-01-20 | Discharge: 2022-01-23 | DRG: 291 | Disposition: A | Discharge status: Home / Self Care | Payer: Medicare Other | Attending: Internal Medicine | Admitting: Internal Medicine

## 2022-01-20 ENCOUNTER — Emergency Department: Payer: Medicare Other

## 2022-01-20 DIAGNOSIS — Z90710 Acquired absence of both cervix and uterus: Secondary | ICD-10-CM | POA: Diagnosis not present

## 2022-01-20 DIAGNOSIS — D539 Nutritional anemia, unspecified: Secondary | ICD-10-CM | POA: Diagnosis present

## 2022-01-20 DIAGNOSIS — Z681 Body mass index (BMI) 19 or less, adult: Secondary | ICD-10-CM

## 2022-01-20 DIAGNOSIS — R809 Proteinuria, unspecified: Secondary | ICD-10-CM | POA: Diagnosis present

## 2022-01-20 DIAGNOSIS — E876 Hypokalemia: Secondary | ICD-10-CM

## 2022-01-20 DIAGNOSIS — R001 Bradycardia, unspecified: Secondary | ICD-10-CM | POA: Diagnosis not present

## 2022-01-20 DIAGNOSIS — R609 Edema, unspecified: Secondary | ICD-10-CM | POA: Diagnosis not present

## 2022-01-20 DIAGNOSIS — R110 Nausea: Secondary | ICD-10-CM | POA: Diagnosis not present

## 2022-01-20 DIAGNOSIS — J9601 Acute respiratory failure with hypoxia: Secondary | ICD-10-CM

## 2022-01-20 DIAGNOSIS — Z20822 Contact with and (suspected) exposure to covid-19: Secondary | ICD-10-CM | POA: Diagnosis present

## 2022-01-20 DIAGNOSIS — Z90722 Acquired absence of ovaries, bilateral: Secondary | ICD-10-CM

## 2022-01-20 DIAGNOSIS — I509 Heart failure, unspecified: Secondary | ICD-10-CM | POA: Diagnosis not present

## 2022-01-20 DIAGNOSIS — I160 Hypertensive urgency: Secondary | ICD-10-CM | POA: Diagnosis present

## 2022-01-20 DIAGNOSIS — J9811 Atelectasis: Secondary | ICD-10-CM | POA: Diagnosis present

## 2022-01-20 DIAGNOSIS — E440 Moderate protein-calorie malnutrition: Secondary | ICD-10-CM | POA: Insufficient documentation

## 2022-01-20 DIAGNOSIS — N183 Chronic kidney disease, stage 3 unspecified: Secondary | ICD-10-CM

## 2022-01-20 DIAGNOSIS — R519 Headache, unspecified: Secondary | ICD-10-CM | POA: Diagnosis present

## 2022-01-20 DIAGNOSIS — I2489 Other forms of acute ischemic heart disease: Secondary | ICD-10-CM | POA: Diagnosis not present

## 2022-01-20 DIAGNOSIS — R0602 Shortness of breath: Secondary | ICD-10-CM | POA: Diagnosis not present

## 2022-01-20 DIAGNOSIS — T451X5A Adverse effect of antineoplastic and immunosuppressive drugs, initial encounter: Secondary | ICD-10-CM | POA: Diagnosis present

## 2022-01-20 DIAGNOSIS — I5033 Acute on chronic diastolic (congestive) heart failure: Secondary | ICD-10-CM

## 2022-01-20 DIAGNOSIS — Z79899 Other long term (current) drug therapy: Secondary | ICD-10-CM

## 2022-01-20 DIAGNOSIS — D6481 Anemia due to antineoplastic chemotherapy: Secondary | ICD-10-CM | POA: Diagnosis present

## 2022-01-20 DIAGNOSIS — E039 Hypothyroidism, unspecified: Secondary | ICD-10-CM | POA: Diagnosis present

## 2022-01-20 DIAGNOSIS — N182 Chronic kidney disease, stage 2 (mild): Secondary | ICD-10-CM | POA: Diagnosis not present

## 2022-01-20 DIAGNOSIS — D696 Thrombocytopenia, unspecified: Secondary | ICD-10-CM | POA: Diagnosis not present

## 2022-01-20 DIAGNOSIS — R601 Generalized edema: Secondary | ICD-10-CM | POA: Diagnosis not present

## 2022-01-20 DIAGNOSIS — R778 Other specified abnormalities of plasma proteins: Secondary | ICD-10-CM | POA: Diagnosis not present

## 2022-01-20 DIAGNOSIS — N1831 Chronic kidney disease, stage 3a: Secondary | ICD-10-CM | POA: Diagnosis present

## 2022-01-20 DIAGNOSIS — C569 Malignant neoplasm of unspecified ovary: Secondary | ICD-10-CM | POA: Diagnosis present

## 2022-01-20 DIAGNOSIS — E871 Hypo-osmolality and hyponatremia: Secondary | ICD-10-CM | POA: Diagnosis not present

## 2022-01-20 DIAGNOSIS — Z79890 Hormone replacement therapy (postmenopausal): Secondary | ICD-10-CM

## 2022-01-20 DIAGNOSIS — R7989 Other specified abnormal findings of blood chemistry: Secondary | ICD-10-CM

## 2022-01-20 DIAGNOSIS — J90 Pleural effusion, not elsewhere classified: Secondary | ICD-10-CM

## 2022-01-20 DIAGNOSIS — I130 Hypertensive heart and chronic kidney disease with heart failure and stage 1 through stage 4 chronic kidney disease, or unspecified chronic kidney disease: Secondary | ICD-10-CM | POA: Diagnosis not present

## 2022-01-20 DIAGNOSIS — D631 Anemia in chronic kidney disease: Secondary | ICD-10-CM | POA: Diagnosis present

## 2022-01-20 DIAGNOSIS — D6959 Other secondary thrombocytopenia: Secondary | ICD-10-CM | POA: Diagnosis present

## 2022-01-20 DIAGNOSIS — R600 Localized edema: Secondary | ICD-10-CM | POA: Diagnosis not present

## 2022-01-20 DIAGNOSIS — I10 Essential (primary) hypertension: Secondary | ICD-10-CM | POA: Diagnosis not present

## 2022-01-20 DIAGNOSIS — I5031 Acute diastolic (congestive) heart failure: Secondary | ICD-10-CM | POA: Diagnosis not present

## 2022-01-20 DIAGNOSIS — Z888 Allergy status to other drugs, medicaments and biological substances status: Secondary | ICD-10-CM

## 2022-01-20 HISTORY — DX: Unspecified diastolic (congestive) heart failure: I50.30

## 2022-01-20 HISTORY — DX: Essential (primary) hypertension: I10

## 2022-01-20 LAB — CBC WITH DIFFERENTIAL/PLATELET
Abs Immature Granulocytes: 0.02 10*3/uL (ref 0.00–0.07)
Basophils Absolute: 0 10*3/uL (ref 0.0–0.1)
Basophils Relative: 1 %
Eosinophils Absolute: 0.1 10*3/uL (ref 0.0–0.5)
Eosinophils Relative: 2 %
HCT: 32 % — ABNORMAL LOW (ref 36.0–46.0)
Hemoglobin: 10.2 g/dL — ABNORMAL LOW (ref 12.0–15.0)
Immature Granulocytes: 0 %
Lymphocytes Relative: 20 %
Lymphs Abs: 0.9 10*3/uL (ref 0.7–4.0)
MCH: 34.2 pg — ABNORMAL HIGH (ref 26.0–34.0)
MCHC: 31.9 g/dL (ref 30.0–36.0)
MCV: 107.4 fL — ABNORMAL HIGH (ref 80.0–100.0)
Monocytes Absolute: 0.3 10*3/uL (ref 0.1–1.0)
Monocytes Relative: 7 %
Neutro Abs: 3.3 10*3/uL (ref 1.7–7.7)
Neutrophils Relative %: 70 %
Platelets: 138 10*3/uL — ABNORMAL LOW (ref 150–400)
RBC: 2.98 MIL/uL — ABNORMAL LOW (ref 3.87–5.11)
RDW: 14.4 % (ref 11.5–15.5)
WBC: 4.7 10*3/uL (ref 4.0–10.5)
nRBC: 0 % (ref 0.0–0.2)

## 2022-01-20 LAB — BASIC METABOLIC PANEL
Anion gap: 9 (ref 5–15)
BUN: 26 mg/dL — ABNORMAL HIGH (ref 8–23)
CO2: 23 mmol/L (ref 22–32)
Calcium: 9.9 mg/dL (ref 8.9–10.3)
Chloride: 105 mmol/L (ref 98–111)
Creatinine, Ser: 1.07 mg/dL — ABNORMAL HIGH (ref 0.44–1.00)
GFR, Estimated: 56 mL/min — ABNORMAL LOW (ref >60)
Glucose, Bld: 115 mg/dL — ABNORMAL HIGH (ref 70–99)
Potassium: 3.7 mmol/L (ref 3.5–5.1)
Sodium: 137 mmol/L (ref 135–145)

## 2022-01-20 LAB — TROPONIN I (HIGH SENSITIVITY): Troponin I (High Sensitivity): 50 ng/L — ABNORMAL HIGH (ref <18)

## 2022-01-20 LAB — BRAIN NATRIURETIC PEPTIDE: B Natriuretic Peptide: >4500 pg/mL — ABNORMAL HIGH (ref 0.0–100.0)

## 2022-01-20 MED ORDER — INSULIN ASPART 100 UNIT/ML IJ SOLN: 0.0000 [IU] | Freq: Three times a day (TID) | INTRAMUSCULAR | Status: DC

## 2022-01-20 MED ORDER — ACETAMINOPHEN 650 MG RE SUPP: 650.0000 mg | Freq: Four times a day (QID) | RECTAL | Status: DC | PRN

## 2022-01-20 MED ORDER — ACETAMINOPHEN 325 MG PO TABS
650.0000 mg | ORAL_TABLET | Freq: Four times a day (QID) | ORAL | Status: DC | PRN
  Administered 2022-01-22: 650 mg via ORAL
  Filled 2022-01-20: qty 2

## 2022-01-20 MED ORDER — METOPROLOL TARTRATE 50 MG PO TABS
50.0000 mg | ORAL_TABLET | Freq: Two times a day (BID) | ORAL | Status: DC
  Administered 2022-01-21: 50 mg via ORAL
  Filled 2022-01-20: qty 1

## 2022-01-20 MED ORDER — ACETAMINOPHEN 500 MG PO TABS
1000.0000 mg | ORAL_TABLET | Freq: Once | ORAL | Status: AC
  Administered 2022-01-20: 1000 mg via ORAL
  Filled 2022-01-20: qty 2

## 2022-01-20 MED ORDER — LOSARTAN POTASSIUM 50 MG PO TABS
50.0000 mg | ORAL_TABLET | Freq: Every day | ORAL | Status: DC
  Administered 2022-01-21: 50 mg via ORAL
  Filled 2022-01-20: qty 1

## 2022-01-20 MED ORDER — HYDROCODONE-ACETAMINOPHEN 5-325 MG PO TABS: 1.0000 | ORAL_TABLET | ORAL | Status: DC | PRN

## 2022-01-20 MED ORDER — LEVOTHYROXINE SODIUM 100 MCG PO TABS
100.0000 ug | ORAL_TABLET | Freq: Every day | ORAL | Status: DC
  Administered 2022-01-21 – 2022-01-23 (×3): 100 ug via ORAL
  Filled 2022-01-20: qty 1
  Filled 2022-01-20 (×2): qty 2

## 2022-01-20 MED ORDER — INSULIN ASPART 100 UNIT/ML IJ SOLN: 0.0000 [IU] | Freq: Every day | INTRAMUSCULAR | Status: DC

## 2022-01-20 MED ORDER — FUROSEMIDE 10 MG/ML IJ SOLN
40.0000 mg | Freq: Once | INTRAMUSCULAR | Status: AC
  Administered 2022-01-20: 40 mg via INTRAVENOUS
  Filled 2022-01-20: qty 4

## 2022-01-20 MED ORDER — ONDANSETRON HCL 4 MG/2ML IJ SOLN
4.0000 mg | Freq: Four times a day (QID) | INTRAMUSCULAR | Status: DC | PRN
  Administered 2022-01-22: 4 mg via INTRAVENOUS
  Filled 2022-01-20: qty 2

## 2022-01-20 MED ORDER — ENOXAPARIN SODIUM 40 MG/0.4ML IJ SOSY
40.0000 mg | PREFILLED_SYRINGE | INTRAMUSCULAR | Status: DC
  Administered 2022-01-21 – 2022-01-22 (×3): 40 mg via SUBCUTANEOUS
  Filled 2022-01-20 (×3): qty 0.4

## 2022-01-20 MED ORDER — ONDANSETRON HCL 4 MG PO TABS: 4.0000 mg | ORAL_TABLET | Freq: Four times a day (QID) | ORAL | Status: DC | PRN

## 2022-01-20 MED ORDER — HYDRALAZINE HCL 20 MG/ML IJ SOLN: 5.0000 mg | Freq: Four times a day (QID) | INTRAMUSCULAR | Status: DC | PRN

## 2022-01-20 MED ORDER — FUROSEMIDE 10 MG/ML IJ SOLN
40.0000 mg | Freq: Two times a day (BID) | INTRAMUSCULAR | Status: DC
  Administered 2022-01-21 – 2022-01-22 (×4): 40 mg via INTRAVENOUS
  Filled 2022-01-20 (×4): qty 4

## 2022-01-20 NOTE — Assessment & Plan Note (Signed)
Continue levothyroxine.  TSH previously pretty elevated.  Will need to follow-up thyroid function as outpatient.

## 2022-01-20 NOTE — ED Provider Notes (Signed)
[facility] Provider Note    Event Date/Time   First MD Initiated Contact with Patient 01/20/22 2053     (approximate)   History   Hypertension   HPI  Katie Hill is a 71 y.o. female with past medical history of hypertension, ovarian cancer, here with progressively worsening lower extremity swelling, shortness of breath.  The patient states that over the last several weeks, she separatively worsening bilateral lower extremity edema.  She has been seen by her oncologist for this and had echocardiogram as well as DVT studies which were negative.  She was started on Lasix as needed.  She states that over the last day or so, she has had progressive worsening shortness of breath and she essentially cannot lay flat.  She has had significant orthopnea.  She has had worsening swelling as well.  She has gained weight.     Physical Exam   Triage Vital Signs: ED Triage Vitals  Enc Vitals Group     BP 01/20/22 1848 (!) 186/94     Pulse Rate 01/20/22 1848 80     Resp 01/20/22 1848 20     Temp 01/20/22 1848 97.8 F (36.6 C)     Temp Source 01/20/22 1848 Oral     SpO2 01/20/22 1848 98 %     Weight 01/20/22 1849 116 lb (52.6 kg)     Height 01/20/22 1849 '5\' 8"'$  (1.727 m)     Head Circumference --      Peak Flow --      Pain Score 01/20/22 1859 9     Pain Loc --      Pain Edu? --      Excl. in [place]? --     Most recent vital signs: Vitals:   01/20/22 1848  BP: (!) 186/94  Pulse: 80  Resp: 20  Temp: 97.8 F (36.6 C)  SpO2: 98%     General: Awake, no distress.  CV:  Good peripheral perfusion.  Regular rate and rhythm.  No murmurs. Resp:  Normal effort.  Bilateral rails, with mild tachypnea. Abd:  No distention.  No tenderness. Other:  2+ pitting edema bilateral lower extremities.   ED Results / Procedures / Treatments   Labs (all labs ordered are listed, but only abnormal results are displayed) Labs Reviewed  BASIC METABOLIC PANEL - Abnormal;  Notable for the following components:      Result Value   Glucose, Bld 115 (*)    BUN 26 (*)    Creatinine, Ser 1.07 (*)    GFR, Estimated 56 (*)    All other components within normal limits  CBC WITH DIFFERENTIAL/PLATELET - Abnormal; Notable for the following components:   RBC 2.98 (*)    Hemoglobin 10.2 (*)    HCT 32.0 (*)    MCV 107.4 (*)    MCH 34.2 (*)    Platelets 138 (*)    All other components within normal limits  BRAIN NATRIURETIC PEPTIDE - Abnormal; Notable for the following components:   B Natriuretic Peptide >4,500.0 (*)    All other components within normal limits  TROPONIN I (HIGH SENSITIVITY) - Abnormal; Notable for the following components:   Troponin I (High Sensitivity) 50 (*)    All other components within normal limits  CBC  CREATININE, SERUM  HIV ANTIBODY (ROUTINE TESTING W REFLEX)  HEMOGLOBIN A1C  TROPONIN I (HIGH SENSITIVITY)     EKG Sinus rhythm with PACs.  Nonspecific ST abnormality.  Ventricular rate  81.  PR 126, QRS 84, QTc 429.  No acute ST elevations depressions.   RADIOLOGY CT head: No acute abnormality Chest x-ray: Small bilateral pleural effusions with basilar atelectasis or pneumonia   I also independently reviewed and agree with radiologist interpretations.   PROCEDURES:  Critical Care performed: No   MEDICATIONS ORDERED IN ED: Medications  losartan (COZAAR) tablet 50 mg (has no administration in time range)  metoprolol tartrate (LOPRESSOR) tablet 50 mg (has no administration in time range)  levothyroxine (SYNTHROID) tablet 100 mcg (has no administration in time range)  enoxaparin (LOVENOX) injection 40 mg (has no administration in time range)  acetaminophen (TYLENOL) tablet 650 mg (has no administration in time range)    Or  acetaminophen (TYLENOL) suppository 650 mg (has no administration in time range)  ondansetron (ZOFRAN) tablet 4 mg (has no administration in time range)    Or  ondansetron (ZOFRAN) injection 4 mg (has no  administration in time range)  furosemide (LASIX) injection 40 mg (has no administration in time range)  insulin aspart (novoLOG) injection 0-6 Units (has no administration in time range)  insulin aspart (novoLOG) injection 0-5 Units (has no administration in time range)  HYDROcodone-acetaminophen (NORCO/VICODIN) 5-325 MG per tablet 1-2 tablet (has no administration in time range)  hydrALAZINE (APRESOLINE) injection 5 mg (has no administration in time range)  acetaminophen (TYLENOL) tablet 1,000 mg (1,000 mg Oral Given 01/20/22 1902)  furosemide (LASIX) injection 40 mg (40 mg Intravenous Given 01/20/22 2203)     IMPRESSION / MDM / ASSESSMENT AND PLAN / ED COURSE  I reviewed the triage vital signs and the nursing notes.                               The patient is on the cardiac monitor to evaluate for evidence of arrhythmia and/or significant heart rate changes.   Ddx:  Differential includes the following, with pertinent life- or limb-threatening emergencies considered:  Pulmonary edema/anasarca in the setting of ovarian cancer, CHF, liver failure, renal failure, hypoalbuminemia, medication effect, ACS  Patient's presentation is most consistent with acute presentation with potential threat to life or bodily function.  MDM:  71 year old female with history of ovarian cancer here with suspected hypervolemia/anasarca in the setting of ovarian cancer.  Patient's last echocardiogram showed no evidence of CHF although clinically this appears to be the case.  Chest x-ray shows pleural effusions with basilar atelectasis.  BNP greater than 4500.  Troponin slightly elevated likely from demand.  EKG nonischemic and she has no chest pain.  BMP shows normal renal function.  CBC without significant change in her chronic anemia.  Patient will be started on IV Lasix and admitted to medicine.   MEDICATIONS GIVEN IN ED: Medications  losartan (COZAAR) tablet 50 mg (has no administration in time range)   metoprolol tartrate (LOPRESSOR) tablet 50 mg (has no administration in time range)  levothyroxine (SYNTHROID) tablet 100 mcg (has no administration in time range)  enoxaparin (LOVENOX) injection 40 mg (has no administration in time range)  acetaminophen (TYLENOL) tablet 650 mg (has no administration in time range)    Or  acetaminophen (TYLENOL) suppository 650 mg (has no administration in time range)  ondansetron (ZOFRAN) tablet 4 mg (has no administration in time range)    Or  ondansetron (ZOFRAN) injection 4 mg (has no administration in time range)  furosemide (LASIX) injection 40 mg (has no administration in time range)  insulin aspart (novoLOG)  injection 0-6 Units (has no administration in time range)  insulin aspart (novoLOG) injection 0-5 Units (has no administration in time range)  HYDROcodone-acetaminophen (NORCO/VICODIN) 5-325 MG per tablet 1-2 tablet (has no administration in time range)  hydrALAZINE (APRESOLINE) injection 5 mg (has no administration in time range)  acetaminophen (TYLENOL) tablet 1,000 mg (1,000 mg Oral Given 01/20/22 1902)  furosemide (LASIX) injection 40 mg (40 mg Intravenous Given 01/20/22 2203)     Consults:  Hospitalist   EMR reviewed  Reviewed prior echocardiogram and work-up for her leg swelling with her oncologist     FINAL CLINICAL IMPRESSION(S) / ED DIAGNOSES   Final diagnoses:  Anasarca  Elevated brain natriuretic peptide (BNP) level  Pleural effusion     Rx / DC Orders   ED Discharge Orders     None        Note:  This document was prepared using Dragon voice recognition software and may include unintentional dictation errors.   Duffy Bruce, MD 01/20/22 [id number]

## 2022-01-20 NOTE — Assessment & Plan Note (Addendum)
Today's platelet count 145

## 2022-01-20 NOTE — H&P (Incomplete)
History and Physical    Patient: Katie Hill MPN:[id number] DOB: July 23, 1950 DOA: 01/20/2022 DOS: the patient was seen and examined on 01/20/2022 PCP: Cletis Athens, MD  Patient coming from: Home  Chief Complaint:  Chief Complaint  Patient presents with   Hypertension    HPI: Katie Hill is a 71 y.o. female with medical history significant for Hypertension, hypothyroidism, ovarian cancer s/p TAH/BSO and on chemotherapy with chemotherapy-induced anemia and thrombocytopenia who presents to the ED with a 1 month history of progressively worsening bilateral lower extremity edema, now associated with shortness of breath and orthopnea.  Patient was being worked up by her oncologist and received lower extremity Dopplers which were negative for DVT and also had an echocardiogram with normal LVEF.  She was empirically treated with Lasix 20 mg with no improvement.  She presented to the ED for worsening symptoms.  She endorses a dry cough but denies fever or chills. ED course and data review: BP 186/94, respirations 20 and O2 sat 98% on 3 L..  Labs remarkable for troponin 50 and BNP over 4500, hemoglobin of 10.2 which is at baseline and platelets 1 38,000 improved from baseline.  Creatinine of 1.07 near baselineI  EKG, personally viewed and interpreted showing sinus rhythm at 81 with no acute ST-T wave changes.  CT head nonacute.  Chest x-ray showing small bilateral pleural effusions with basilar atelectasis or pneumonia. Hospitalist consulted for admission.   Review of Systems: As mentioned in the history of present illness. All other systems reviewed and are negative.  Past Medical History:  Diagnosis Date   Dysrhythmia    Genetic testing 03/28/2017   Multi-Cancer panel (83 genes) @ [facility] - No pathogenic mutations detected   High grade ovarian cancer ([place]) 11/20/2016   Pelvic mass in female    Past Surgical History:  Procedure Laterality Date   APPENDECTOMY     LAPAROSCOPY N/A  03/14/2017   Procedure: LAPAROSCOPY OPERATIVE;  Surgeon: Mellody Drown, MD;  Location: [facility];  Service: Gynecology;  Laterality: N/A;   LAPAROTOMY N/A 03/14/2017   Procedure: LAPAROTOMY;  Surgeon: Mellody Drown, MD;  Location: [facility];  Service: Gynecology;  Laterality: N/A;   LYMPH NODE DISSECTION N/A 03/14/2017   Procedure: LYMPH NODE DISSECTION;  Surgeon: Mellody Drown, MD;  Location: [facility];  Service: Gynecology;  Laterality: N/A;   OMENTECTOMY N/A 03/14/2017   Procedure: OMENTECTOMY;  Surgeon: Mellody Drown, MD;  Location: [facility];  Service: Gynecology;  Laterality: N/A;   PORTA CATH INSERTION N/A 11/27/2016   Procedure: Glori Luis Cath Insertion;  Surgeon: Algernon Huxley, MD;  Location: [facility];  Service: Cardiovascular;  Laterality: N/A;   Social History:  reports that she has never smoked. She has never used smokeless tobacco. She reports that she does not currently use alcohol. She reports that she does not use drugs.  Allergies  Allergen Reactions   Omeprazole Rash    Family History  Problem Relation Age of Onset   Throat cancer Cousin    Throat cancer Cousin    Leukemia Cousin     Prior to Admission medications   Medication Sig Start Date End Date Taking? Authorizing Provider  furosemide (LASIX) 40 MG tablet Take 1 tablet (40 mg total) by mouth daily. 12/30/21   Earlie Server, MD  furosemide (LASIX) 40 MG tablet Take 1 tablet (40 mg total) by mouth daily. 12/30/21   Earlie Server, MD  levothyroxine (SYNTHROID) 100 MCG tablet Take 1 tablet (100 mcg total) by  mouth daily. 04/25/21   Cletis Athens, MD  lidocaine-prilocaine (EMLA) cream Apply 1 application topically as needed. Apply small amount to port site at least 1 hour prior to it being accessed, cover with plastic wrap 12/01/20   Earlie Server, MD  losartan (COZAAR) 25 MG tablet Take 1 tablet (25 mg total) by mouth daily. 01/13/22   Earlie Server, MD  losartan (COZAAR) 50 MG tablet Take 1 tablet (50 mg total) by mouth daily.  01/13/22   Earlie Server, MD  metoprolol tartrate (LOPRESSOR) 50 MG tablet Take 1 tablet (50 mg total) by mouth 2 (two) times daily. 01/06/22   Verlon Au, NP  ondansetron (ZOFRAN) 8 MG tablet Take 1 tablet (8 mg total) by mouth every 8 (eight) hours as needed for nausea or vomiting. 12/22/20   Earlie Server, MD    Physical Exam: Vitals:   01/20/22 1848 01/20/22 1849  BP: (!) 186/94   Pulse: 80   Resp: 20   Temp: 97.8 F (36.6 C)   TempSrc: Oral   SpO2: 98%   Weight:  52.6 kg  Height:  '5\' 8"'$  (1.727 m)   Physical Exam Vitals and nursing note reviewed.  Constitutional:      General: She is not in acute distress.    Interventions: Nasal cannula in place.     Comments: Conversational dyspnea, increased work of breathing and speaking in short sentences  HENT:     Head: Normocephalic and atraumatic.  Cardiovascular:     Rate and Rhythm: Normal rate and regular rhythm.     Heart sounds: Normal heart sounds.  Pulmonary:     Effort: Tachypnea present.     Breath sounds: Decreased air movement present. Examination of the right-lower field reveals decreased breath sounds. Examination of the left-lower field reveals decreased breath sounds. Decreased breath sounds present.  Abdominal:     Palpations: Abdomen is soft.     Tenderness: There is no abdominal tenderness.  Musculoskeletal:     Right lower leg: Edema present.     Left lower leg: Edema present.  Neurological:     Mental Status: Mental status is at baseline.     Labs on Admission: I have personally reviewed following labs and imaging studies  CBC: Recent Labs  Lab 01/20/22 1859  WBC 4.7  NEUTROABS 3.3  HGB 10.2*  HCT 32.0*  MCV 107.4*  PLT 923*   Basic Metabolic Panel: Recent Labs  Lab 01/20/22 1859  NA 137  K 3.7  CL 105  CO2 23  GLUCOSE 115*  BUN 26*  CREATININE 1.07*  CALCIUM 9.9   GFR: Estimated Creatinine Clearance: 41.2 mL/min (A) (by C-G formula based on SCr of 1.07 mg/dL (H)). Liver Function  Tests: No results for input(s): "AST", "ALT", "ALKPHOS", "BILITOT", "PROT", "ALBUMIN" in the last 168 hours. No results for input(s): "LIPASE", "AMYLASE" in the last 168 hours. No results for input(s): "AMMONIA" in the last 168 hours. Coagulation Profile: No results for input(s): "INR", "PROTIME" in the last 168 hours. Cardiac Enzymes: No results for input(s): "CKTOTAL", "CKMB", "CKMBINDEX", "TROPONINI" in the last 168 hours. BNP (last 3 results) No results for input(s): "PROBNP" in the last 8760 hours. HbA1C: No results for input(s): "HGBA1C" in the last 72 hours. CBG: No results for input(s): "GLUCAP" in the last 168 hours. Lipid Profile: No results for input(s): "CHOL", "HDL", "LDLCALC", "TRIG", "CHOLHDL", "LDLDIRECT" in the last 72 hours. Thyroid Function Tests: No results for input(s): "TSH", "T4TOTAL", "FREET4", "T3FREE", "THYROIDAB" in the last  72 hours. Anemia Panel: No results for input(s): "VITAMINB12", "FOLATE", "FERRITIN", "TIBC", "IRON", "RETICCTPCT" in the last 72 hours. Urine analysis:    Component Value Date/Time   COLORURINE STRAW (A) 11/13/2016 1409   APPEARANCEUR CLEAR (A) 11/13/2016 1409   LABSPEC 1.008 11/13/2016 1409   PHURINE 7.0 11/13/2016 1409   GLUCOSEU NEGATIVE 11/13/2016 1409   HGBUR SMALL (A) 11/13/2016 1409   BILIRUBINUR NEGATIVE 11/13/2016 1409   KETONESUR NEGATIVE 11/13/2016 1409   PROTEINUR NEGATIVE 11/13/2016 1409   NITRITE NEGATIVE 11/13/2016 1409   LEUKOCYTESUR NEGATIVE 11/13/2016 1409    Radiological Exams on Admission: CT Head Wo Contrast  Result Date: 01/20/2022 CLINICAL DATA:  Headache, sudden, severe EXAM: CT HEAD WITHOUT CONTRAST TECHNIQUE: Contiguous axial images were obtained from the base of the skull through the vertex without intravenous contrast. RADIATION DOSE REDUCTION: This exam was performed according to the departmental dose-optimization program which includes automated exposure control, adjustment of the mA and/or kV  according to patient size and/or use of iterative reconstruction technique. COMPARISON:  CT head January 13, 2022. FINDINGS: Brain: No evidence of acute infarction, hemorrhage, hydrocephalus, extra-axial collection or mass lesion/mass effect. Vascular: No hyperdense vessel. Skull: No acute fracture. Sinuses/Orbits: Clear sinuses.  No acute orbital findings. Other: No mastoid effusions IMPRESSION: No evidence of acute intracranial abnormality. Electronically Signed   By: Margaretha Sheffield M.D.   On: 01/20/2022 16:37   DG Chest 2 View  Result Date: 01/20/2022 CLINICAL DATA:  Hypertension EXAM: CHEST - 2 VIEW COMPARISON:  CT 12/26/2021 FINDINGS: Right-sided central venous port tip over the cavoatrial region. Small bilateral pleural effusions with airspace disease at both bases. Partially obscured cardiomediastinal silhouette. No pneumothorax. IMPRESSION: Small bilateral pleural effusions with basilar atelectasis or pneumonia Electronically Signed   By: Donavan Foil M.D.   On: 01/20/2022 16:35     Data Reviewed: Relevant notes from primary care and specialist visits, past discharge summaries as available in EHR, including Care Everywhere. Prior diagnostic testing as pertinent to current admission diagnoses Updated medications and problem lists for reconciliation ED course, including vitals, labs, imaging, treatment and response to treatment Triage notes, nursing and pharmacy notes and ED provider's notes Notable results as noted in HPI   Assessment and Plan: * Acute CHF (congestive heart failure) (HCC) Bilateral pleural effusions Acute respiratory failure with hypoxia Patient with anasarca, shortness of breath requiring 3 L to maintain sats in the mid to high 80s, troponin 50 and BNP over 4500 Etiology of CHF uncertain,?chemotherapy, uncontrolled BP Echo 12/23/2021 with EF 60-65 and overall unremarkable IV Lasix and continue home losartan and metoprolol Daily weights with intake and output  monitoring Control blood pressure with home meds, IV Lasix and as needed IV hydralazine Cardiology consulted  Hypothyroidism Continue levothyroxine  Thrombocytopenia, chemotherapy-induced (HCC) Stable  Anasarca Proteinuria Secondary to CHF and proteinuria Patient had a 24-hour urine done in 01/01/22 with  Protein, Urine mg/dL 108   Protein, 24H Urine 50 - 100 mg/day 864 High     Dietary consult Nephrology consult( was referred to renal outpatient)  Anemia due to antineoplastic chemotherapy Hemoglobin at baseline  Malignant neoplasm of ovary ([place]) Followed by oncology.  On chemotherapy.  Last infusion 10/6        DVT prophylaxis: Lovenox  Consults: renal, [facility] cardiologyDr Agbor etang  Advance Care Planning:   Code Status: Prior   Family Communication: husband at bedside    Disposition Plan: Back to previous home environment  Severity of Illness: The appropriate patient status for this  patient is INPATIENT. Inpatient status is judged to be reasonable and necessary in order to provide the required intensity of service to ensure the patient's safety. The patient's presenting symptoms, physical exam findings, and initial radiographic and laboratory data in the context of their chronic comorbidities is felt to place them at high risk for further clinical deterioration. Furthermore, it is not anticipated that the patient will be medically stable for discharge from the hospital within 2 midnights of admission.   * I certify that at the point of admission it is my clinical judgment that the patient will require inpatient hospital care spanning beyond 2 midnights from the point of admission due to high intensity of service, high risk for further deterioration and high frequency of surveillance required.*  Author: Athena Masse, MD 01/20/2022 10:41 PM  For on call review [email or web address].

## 2022-01-20 NOTE — Assessment & Plan Note (Deleted)
Bilateral pleural effusions Acute respiratory failure with hypoxia Patient with anasarca, shortness of breath requiring 3 L to maintain sats in the mid to high 80s, troponin 50 and BNP over 4500 Etiology of CHF uncertain,?chemotherapy, uncontrolled BP Echo 12/23/2021 with EF 60-65 and overall unremarkable IV Lasix and continue home losartan and metoprolol Daily weights with intake and output monitoring Control blood pressure with home meds, IV Lasix and as needed IV hydralazine Cardiology consulted

## 2022-01-20 NOTE — ED Triage Notes (Signed)
Patient sent by PCP for hypertension. Reports she could not sleep last night due to coughing. Constant leg swelling that patient reports no one can get under control.   Has been on chemo for 5 years due to ovarian cancer.

## 2022-01-20 NOTE — Assessment & Plan Note (Addendum)
With proteinuria. Nephrology consulted by admitting physician.

## 2022-01-20 NOTE — ED Notes (Signed)
Pt called for triage. Pt currently in xray

## 2022-01-20 NOTE — Assessment & Plan Note (Signed)
Last hemoglobin 10.0

## 2022-01-20 NOTE — ED Provider Triage Note (Signed)
  Emergency Medicine Provider Triage Evaluation Note  Katie Hill , a 71 y.o.female,  was evaluated in triage.  Pt complains of hypertension, headache, and chest discomfort.  She states that her blood pressure earlier today was in the 827M systolic.  In addition endorses headache and chest discomfort as well.  Currently undergoing treatment for cancer at this time.  Denies any other symptoms.   Review of Systems  Positive: Headache, hypertension, chest discomfort Negative: Denies fever, abdominal pain, vomiting  Physical Exam  There were no vitals filed for this visit. Gen:   Awake, no distress   Resp:  Normal effort  MSK:   Moves extremities without difficulty  Other:    Medical Decision Making  Given the patient's initial medical screening exam, the following diagnostic evaluation has been ordered. The patient will be placed in the appropriate treatment space, once one is available, to complete the evaluation and treatment. I have discussed the plan of care with the patient and I have advised the patient that an ED physician or mid-level practitioner will reevaluate their condition after the test results have been received, as the results may give them additional insight into the type of treatment they may need.    Diagnostics: Labs, CXR, head CT, EKG  Treatments: none immediately   Teodoro Spray, [place] 01/20/22 1616

## 2022-01-20 NOTE — Assessment & Plan Note (Signed)
Oncology follow-up as outpatient.

## 2022-01-20 NOTE — ED Notes (Signed)
Transported to Xray and CT

## 2022-01-21 ENCOUNTER — Encounter: Payer: Self-pay | Admitting: Internal Medicine

## 2022-01-21 ENCOUNTER — Inpatient Hospital Stay: Payer: Medicare Other

## 2022-01-21 DIAGNOSIS — J90 Pleural effusion, not elsewhere classified: Secondary | ICD-10-CM | POA: Diagnosis not present

## 2022-01-21 DIAGNOSIS — I2489 Other forms of acute ischemic heart disease: Secondary | ICD-10-CM

## 2022-01-21 DIAGNOSIS — I5031 Acute diastolic (congestive) heart failure: Secondary | ICD-10-CM

## 2022-01-21 DIAGNOSIS — D696 Thrombocytopenia, unspecified: Secondary | ICD-10-CM

## 2022-01-21 DIAGNOSIS — E039 Hypothyroidism, unspecified: Secondary | ICD-10-CM

## 2022-01-21 DIAGNOSIS — R778 Other specified abnormalities of plasma proteins: Secondary | ICD-10-CM

## 2022-01-21 DIAGNOSIS — I160 Hypertensive urgency: Secondary | ICD-10-CM

## 2022-01-21 DIAGNOSIS — N1831 Chronic kidney disease, stage 3a: Secondary | ICD-10-CM

## 2022-01-21 DIAGNOSIS — N183 Chronic kidney disease, stage 3 unspecified: Secondary | ICD-10-CM

## 2022-01-21 DIAGNOSIS — I5033 Acute on chronic diastolic (congestive) heart failure: Secondary | ICD-10-CM | POA: Diagnosis not present

## 2022-01-21 DIAGNOSIS — J9601 Acute respiratory failure with hypoxia: Secondary | ICD-10-CM | POA: Diagnosis not present

## 2022-01-21 DIAGNOSIS — T451X5A Adverse effect of antineoplastic and immunosuppressive drugs, initial encounter: Secondary | ICD-10-CM

## 2022-01-21 DIAGNOSIS — R601 Generalized edema: Secondary | ICD-10-CM

## 2022-01-21 DIAGNOSIS — D6481 Anemia due to antineoplastic chemotherapy: Secondary | ICD-10-CM

## 2022-01-21 DIAGNOSIS — C569 Malignant neoplasm of unspecified ovary: Secondary | ICD-10-CM

## 2022-01-21 DIAGNOSIS — I10 Essential (primary) hypertension: Secondary | ICD-10-CM

## 2022-01-21 LAB — CREATININE, SERUM
Creatinine, Ser: 0.97 mg/dL (ref 0.44–1.00)
GFR, Estimated: >60 mL/min (ref >60)

## 2022-01-21 LAB — CBC
HCT: 30.3 % — ABNORMAL LOW (ref 36.0–46.0)
Hemoglobin: 10 g/dL — ABNORMAL LOW (ref 12.0–15.0)
MCH: 35.1 pg — ABNORMAL HIGH (ref 26.0–34.0)
MCHC: 33 g/dL (ref 30.0–36.0)
MCV: 106.3 fL — ABNORMAL HIGH (ref 80.0–100.0)
Platelets: 173 10*3/uL (ref 150–400)
RBC: 2.85 MIL/uL — ABNORMAL LOW (ref 3.87–5.11)
RDW: 14.2 % (ref 11.5–15.5)
WBC: 5.1 10*3/uL (ref 4.0–10.5)
nRBC: 0 % (ref 0.0–0.2)

## 2022-01-21 LAB — HIV ANTIBODY (ROUTINE TESTING W REFLEX): HIV Screen 4th Generation wRfx: NONREACTIVE

## 2022-01-21 LAB — HEMOGLOBIN A1C
Hgb A1c MFr Bld: 4 % — ABNORMAL LOW (ref 4.8–5.6)
Mean Plasma Glucose: 68.1 mg/dL

## 2022-01-21 LAB — PROTEIN / CREATININE RATIO, URINE
Creatinine, Urine: 14 mg/dL
Protein Creatinine Ratio: 1.36 mg/mg{Cre} — ABNORMAL HIGH (ref 0.00–0.15)
Total Protein, Urine: 19 mg/dL

## 2022-01-21 LAB — CBG MONITORING, ED: Glucose-Capillary: 97 mg/dL (ref 70–99)

## 2022-01-21 LAB — TROPONIN I (HIGH SENSITIVITY): Troponin I (High Sensitivity): 57 ng/L — ABNORMAL HIGH

## 2022-01-21 LAB — SARS CORONAVIRUS 2 BY RT PCR: SARS Coronavirus 2 by RT PCR: NEGATIVE

## 2022-01-21 MED ORDER — CARVEDILOL 6.25 MG PO TABS
6.2500 mg | ORAL_TABLET | Freq: Two times a day (BID) | ORAL | Status: DC
  Administered 2022-01-21 – 2022-01-22 (×3): 6.25 mg via ORAL
  Filled 2022-01-21 (×3): qty 1

## 2022-01-21 MED ORDER — SPIRONOLACTONE 25 MG PO TABS
25.0000 mg | ORAL_TABLET | Freq: Every day | ORAL | Status: DC
  Administered 2022-01-21 – 2022-01-23 (×3): 25 mg via ORAL
  Filled 2022-01-21 (×3): qty 1

## 2022-01-21 MED ORDER — HYDRALAZINE HCL 20 MG/ML IJ SOLN
10.0000 mg | Freq: Four times a day (QID) | INTRAMUSCULAR | Status: DC | PRN
  Administered 2022-01-22 – 2022-01-23 (×2): 10 mg via INTRAVENOUS
  Filled 2022-01-21 (×2): qty 1

## 2022-01-21 MED ORDER — LOSARTAN POTASSIUM 50 MG PO TABS
100.0000 mg | ORAL_TABLET | Freq: Every day | ORAL | Status: DC
  Administered 2022-01-22 – 2022-01-23 (×2): 100 mg via ORAL
  Filled 2022-01-21 (×2): qty 2

## 2022-01-21 MED ORDER — LOSARTAN POTASSIUM 50 MG PO TABS
50.0000 mg | ORAL_TABLET | Freq: Once | ORAL | Status: AC
  Administered 2022-01-21: 50 mg via ORAL
  Filled 2022-01-21: qty 1

## 2022-01-21 MED ORDER — ASPIRIN 81 MG PO CHEW
81.0000 mg | CHEWABLE_TABLET | Freq: Every day | ORAL | Status: DC
  Administered 2022-01-21 – 2022-01-23 (×3): 81 mg via ORAL
  Filled 2022-01-21 (×3): qty 1

## 2022-01-21 NOTE — Assessment & Plan Note (Signed)
Last EF normal range.  IV Lasix held on the day of discharge.  As per cardiology can go back on Lasix 40 mg daily.  Continue increased dose of losartan and Aldactone.  Cardiology cleared to go back on low-dose beta-blocker at night.  I prescribed Toprol-XL 12.5 mg nightly.  Hydralazine increased to 25 mg 3 times daily.

## 2022-01-21 NOTE — Assessment & Plan Note (Signed)
At home blood pressure in the 190s.  Patient's blood pressure trended better to 159/91 upon discharge.  She will be on Lasix 40 mg daily, hydralazine 25 mg 3 times daily, Toprol-XL 12.5 mg at night, spironolactone 25 mg daily and losartan 100 mg daily.  Recommend checking BMP and follow-up appointment and titrating medications if need be.

## 2022-01-21 NOTE — Progress Notes (Signed)
I advised patient that we do not have progressive beds currently and she may be in the ER for her hospital stay.  She declined transfer to [facility] progressive care unit.  She wants to go home tomorrow.  We will check pulse ox on room air. Not coughing as much when lying her flat again Rhonchi at bases. Will give another dose of losartan.  Dr. Loletha Grayer

## 2022-01-21 NOTE — Consult Note (Signed)
Cardiology Consult    Patient ID: Katie Hill MRN: [id number], DOB/AGE: 70-Sep-1952   Admit date: 01/20/2022 Date of Consult: 01/21/2022  Primary Physician: Cletis Athens, MD Primary Cardiologist: New Requesting Provider: R. Wieting, MD  Patient Profile    Katie Hill is a 71 y.o. female with a history of ovarian cancer, hypertension, chemotherapy-induced anemia/thrombocytopenia, and hypothyroidism, who is being seen today for the evaluation of acute HFpEF and elevated troponin at the request of Dr. Leslye Peer.  Past Medical History   Past Medical History:  Diagnosis Date   (HFpEF) heart failure with preserved ejection fraction ([place])    a. 12/2021 Echo: EF 55%, no rwma, nl RV fxn, mild MR, mild-mod TR. Triv AI.   Dysrhythmia    Essential hypertension    Genetic testing 03/28/2017   Multi-Cancer panel (83 genes) @ [facility] - No pathogenic mutations detected   High grade ovarian cancer ([place]) 11/20/2016   Pelvic mass in female     Past Surgical History:  Procedure Laterality Date   APPENDECTOMY     LAPAROSCOPY N/A 03/14/2017   Procedure: LAPAROSCOPY OPERATIVE;  Surgeon: Mellody Drown, MD;  Location: [facility];  Service: Gynecology;  Laterality: N/A;   LAPAROTOMY N/A 03/14/2017   Procedure: LAPAROTOMY;  Surgeon: Mellody Drown, MD;  Location: [facility];  Service: Gynecology;  Laterality: N/A;   LYMPH NODE DISSECTION N/A 03/14/2017   Procedure: LYMPH NODE DISSECTION;  Surgeon: Mellody Drown, MD;  Location: [facility];  Service: Gynecology;  Laterality: N/A;   OMENTECTOMY N/A 03/14/2017   Procedure: OMENTECTOMY;  Surgeon: Mellody Drown, MD;  Location: [facility];  Service: Gynecology;  Laterality: N/A;   PORTA CATH INSERTION N/A 11/27/2016   Procedure: Glori Luis Cath Insertion;  Surgeon: Algernon Huxley, MD;  Location: [facility];  Service: Cardiovascular;  Laterality: N/A;     Allergies  Allergies  Allergen Reactions   Omeprazole Rash    History of Present  Illness    71 year old female with a history of ovarian cancer, hypertension, chemotherapy-induced anemia/thrombocytopenia, and hypothyroidism.  She was diagnosed with ovarian cancer in 2018.  She initially underwent chemotherapy followed by surgery and then has been on and off of chemotherapy for the past 5 years.  In the setting of chemotherapy, she has undergone serial echocardiograms over the years, which have always shown normal LV function.  MUGA scan in July 2022 showed an EF of 66%.  She says that she used to have low blood pressure, over the past few years, blood pressures have been trending in the 160 range despite initiation of losartan 25 mg daily a few years ago.  Beginning about 6 months ago, she started experiencing lower extremity swelling and was placed on a diuretic.  Swelling is typically worse at the end of the day but is still present in the morning.  She was not able to tolerate wearing compression socks.  Over the past 3 months, she has noted a reduction in exercise tolerance.  She is accustomed to being able to push mow her lawn and walks to her mailbox daily and over the past few months, she tires more easily.  She had a repeat echocardiogram in September 15 which showed an EF of 55% with mild MR and mild to moderate TR.  Over the past 2 weeks, she is also noted increasing dyspnea on exertion.  2 days ago, she was walking to her mailbox and had to stop secondary to chest pressure and dyspnea.  Throughout the day on  October 12, she noted cough and orthopnea.  Due to ongoing dyspnea and cough, she presented to the emergency department October 13.  Here, she was hypertensive at 184/94.  ECG unremarkable.  BNP greater than 4500 with troponin of 50  57.  She complained of a headache and CT of the head showed no acute findings.  Chest x-ray with small bilateral pleural effusions basilar atelectasis or pneumonia.  She received Lasix 40 mg x 2 since presentation as well as a dose of metoprolol.   Blood pressure remains elevated at 183/106 this am.  She has ongoing lower extremity edema and orthopnea.  She denies any active chest pain or dyspnea at rest.  Inpatient Medications     carvedilol  6.25 mg Oral BID WC   enoxaparin (LOVENOX) injection  40 mg Subcutaneous Q24H   furosemide  40 mg Intravenous BID   levothyroxine  100 mcg Oral Q0600   losartan  50 mg Oral Daily    Family History    Family History  Problem Relation Age of Onset   Throat cancer Cousin    Throat cancer Cousin    Leukemia Cousin    She indicated that her mother is deceased. She indicated that her father is deceased. She indicated that all of her three cousins are deceased.   Social History    Social History   Socioeconomic History   Marital status: Married    Spouse name: Not on file   Number of children: Not on file   Years of education: Not on file   Highest education level: Not on file  Occupational History   Not on file  Tobacco Use   Smoking status: Never   Smokeless tobacco: Never  Vaping Use   Vaping Use: Never used  Substance and Sexual Activity   Alcohol use: Not Currently   Drug use: No   Sexual activity: Yes    Birth control/protection: Post-menopausal  Other Topics Concern   Not on file  Social History Narrative   Lives locally with husband.  Was fairly active.   Social Determinants of Health   Financial Resource Strain: Low Risk  (04/21/2021)   Overall Financial Resource Strain (CARDIA)    Difficulty of Paying Living Expenses: Not hard at all  Food Insecurity: No Food Insecurity (04/21/2021)   Hunger Vital Sign    Worried About Running Out of Food in the Last Year: Never true    Ran Out of Food in the Last Year: Never true  Transportation Needs: No Transportation Needs (04/21/2021)   PRAPARE - Hydrologist (Medical): No    Lack of Transportation (Non-Medical): No  Physical Activity: Insufficiently Active (04/21/2021)   Exercise Vital Sign     Days of Exercise per Week: 6 days    Minutes of Exercise per Session: 20 min  Stress: No Stress Concern Present (04/21/2021)   [place]    Feeling of Stress : Not at all  Social Connections: Moderately Isolated (04/21/2021)   Social Connection and Isolation Panel [NHANES]    Frequency of Communication with Friends and Family: Three times a week    Frequency of Social Gatherings with Friends and Family: Twice a week    Attends Religious Services: Never    Marine scientist or Organizations: No    Attends Archivist Meetings: Never    Marital Status: Married  Human resources officer Violence: Not At Risk (04/21/2021)  Humiliation, Afraid, Rape, and Kick questionnaire    Fear of Current or Ex-Partner: No    Emotionally Abused: No    Physically Abused: No    Sexually Abused: No     Review of Systems    General:  No chills, fever, night sweats or weight changes.  Cardiovascular:  +++ chest pressure with ambulation on October 12.  +++  Progressive dyspnea on exertion, +++ edema, +++ orthopnea, palpitations, paroxysmal nocturnal dyspnea. Dermatological: No rash, lesions/masses Respiratory: +++ cough, +++ dyspnea Urologic: No hematuria, dysuria Abdominal:   No nausea, vomiting, diarrhea, bright red blood per rectum, melena, or hematemesis Neurologic:  No visual changes, wkns, changes in mental status. All other systems reviewed and are otherwise negative except as noted above.  Physical Exam    Blood pressure (!) 183/106, pulse 68, temperature 98 F (36.7 C), temperature source Oral, resp. rate 19, height '5\' 8"'$  (1.727 m), weight 52.6 kg, SpO2 95 %.  General: Pleasant, NAD Psych: Normal affect. Neuro: Alert and oriented X 3. Moves all extremities spontaneously. HEENT: Normal  Neck: Supple without bruits.  Mildly elevated JVD. Lungs:  Resp regular and unlabored, CTA. Heart: RRR no s3, s4, or murmurs. Abdomen:  Soft, non-tender, non-distended, BS + x 4.  Extremities: No clubbing, cyanosis or 2+ bilateral lower extremity edema. DP/PT2+, Radials 2+ and equal bilaterally.  Labs    Cardiac Enzymes Recent Labs  Lab 01/20/22 1859 01/21/22 0001  TROPONINIHS 50* 57*     BNP    Component Value Date/Time   BNP >4,500.0 (H) 01/20/2022 1859    Lab Results  Component Value Date   WBC 5.1 01/21/2022   HGB 10.0 (L) 01/21/2022   HCT 30.3 (L) 01/21/2022   MCV 106.3 (H) 01/21/2022   PLT 173 01/21/2022    Recent Labs  Lab 01/20/22 1859 01/21/22 0001  NA 137  --   K 3.7  --   CL 105  --   CO2 23  --   BUN 26*  --   CREATININE 1.07* 0.97  CALCIUM 9.9  --   GLUCOSE 115*  --       Radiology Studies    CT Head Wo Contrast  Result Date: 01/20/2022 CLINICAL DATA:  Headache, sudden, severe EXAM: CT HEAD WITHOUT CONTRAST TECHNIQUE: Contiguous axial images were obtained from the base of the skull through the vertex without intravenous contrast. RADIATION DOSE REDUCTION: This exam was performed according to the departmental dose-optimization program which includes automated exposure control, adjustment of the mA and/or kV according to patient size and/or use of iterative reconstruction technique. COMPARISON:  CT head January 13, 2022. FINDINGS: Brain: No evidence of acute infarction, hemorrhage, hydrocephalus, extra-axial collection or mass lesion/mass effect. Vascular: No hyperdense vessel. Skull: No acute fracture. Sinuses/Orbits: Clear sinuses.  No acute orbital findings. Other: No mastoid effusions IMPRESSION: No evidence of acute intracranial abnormality. Electronically Signed   By: Margaretha Sheffield M.D.   On: 01/20/2022 16:37   DG Chest 2 View  Result Date: 01/20/2022 CLINICAL DATA:  Hypertension EXAM: CHEST - 2 VIEW COMPARISON:  CT 12/26/2021 FINDINGS: Right-sided central venous port tip over the cavoatrial region. Small bilateral pleural effusions with airspace disease at both bases. Partially  obscured cardiomediastinal silhouette. No pneumothorax. IMPRESSION: Small bilateral pleural effusions with basilar atelectasis or pneumonia Electronically Signed   By: Donavan Foil M.D.   On: 01/20/2022 16:35   CT HEAD WO CONTRAST (5MM)  Result Date: 01/13/2022 CLINICAL DATA:  Headaches EXAM: CT HEAD WITHOUT CONTRAST  TECHNIQUE: Contiguous axial images were obtained from the base of the skull through the vertex without intravenous contrast. RADIATION DOSE REDUCTION: This exam was performed according to the departmental dose-optimization program which includes automated exposure control, adjustment of the mA and/or kV according to patient size and/or use of iterative reconstruction technique. COMPARISON:  None Available. FINDINGS: Brain: No evidence of acute infarction, hemorrhage, hydrocephalus, extra-axial collection or mass lesion/mass effect. Vascular: No hyperdense vessel or unexpected calcification. Skull: No osseous abnormality. Sinuses/Orbits: Visualized paranasal sinuses are clear. Visualized mastoid sinuses are clear. Visualized orbits demonstrate no focal abnormality. Other: None IMPRESSION: No acute intracranial findings. Electronically Signed   By: Kathreen Devoid M.D.   On: 01/13/2022 10:36   CT CHEST ABDOMEN PELVIS W CONTRAST  Result Date: 12/27/2021 CLINICAL DATA:  Ovarian cancer. Leg swelling. Hysterectomy 2018. Chemotherapy ongoing. * Tracking Code: BO * EXAM: CT CHEST, ABDOMEN, AND PELVIS WITH CONTRAST TECHNIQUE: Multidetector CT imaging of the chest, abdomen and pelvis was performed following the standard protocol during bolus administration of intravenous contrast. RADIATION DOSE REDUCTION: This exam was performed according to the departmental dose-optimization program which includes automated exposure control, adjustment of the mA and/or kV according to patient size and/or use of iterative reconstruction technique. CONTRAST:  132m OMNIPAQUE IOHEXOL 300 MG/ML  SOLN COMPARISON:  CT  09/19/2021 CT CHEST FINDINGS Cardiovascular: Port in the anterior chest wall with tip in distal SVC. No significant vascular findings. Normal heart size. No pericardial effusion. Mediastinum/Nodes: No axillary or supraclavicular adenopathy. No mediastinal or hilar adenopathy. No pericardial fluid. Esophagus normal. Lungs/Pleura: New moderate size layering RIGHT pleural effusion. LEFT lobe pulmonary nodule measures 6 mm (image 99/4) is unchanged. New nodule along the RIGHT oblique fissure measures 4 mm (image 61/4). Subtle pleural thickening more medially along the RIGHT oblique fissure is more prominent on image 75/4. Musculoskeletal: No aggressive osseous lesion. CT ABDOMEN AND PELVIS FINDINGS Hepatobiliary: No focal hepatic lesion. No biliary ductal dilatation. Gallbladder is normal. Common bile duct is normal. Pancreas: Pancreas is normal. No ductal dilatation. No pancreatic inflammation. Spleen: Normal spleen Adrenals/urinary tract: Adrenal glands and kidneys are normal. The ureters and bladder normal. Stomach/Bowel: Stomach, small bowel, appendix, and cecum are normal. The colon and rectosigmoid colon are normal. Vascular/Lymphatic: Abdominal aorta is normal caliber. Small periaortic lymph nodes again noted. Largest focus lymphoid tissue measures 10 mm on image 69/2 not changed from prior. No new lymphadenopathy abdomen pelvis. Reproductive: Post hysterectomy anatomy. Other: soft tissue lesion in the posterior RIGHT pelvis anterior to the sacrum measures 14 x 29 mm (image 109/2) compared to 14 mm x 30 mm. Visually lesion looks similar. No new peritoneal implants Musculoskeletal: No aggressive osseous lesion. IMPRESSION: Chest Impression: 1. New RIGHT pleural effusion and new mild nodularity along the RIGHT oblique fissure. Recommend close attention on follow-up for potential pleural metastasis in the RIGHT hemithorax. 2. Stable LEFT lobe pulmonary nodule. 3. No mediastinal lymphadenopathy. Abdomen / Pelvis  Impression: 1. Stable soft tissue lesion in the deep RIGHT pelvis. 2. Mass stable small periaortic lymph nodes. 3. No intraperitoneal free fluid the abdomen or pelvis. Electronically Signed   By: SSuzy BouchardM.D.   On: 12/27/2021 10:11   ECG & Cardiac Imaging    Regular sinus rhythm, 81, baseline artifact, PVC- personally reviewed.  Assessment & Plan    1.  Acute heart failure with preserved ejection fraction/hypertensive urgency: Patient with a several year history of poorly controlled hypertension and at least a 688-monthistory of lower extremity swelling with a  35-monthhistory of progressive dyspnea on exertion which worsened over the past few days, now associate with cough and orthopnea.  An echocardiogram was performed in mid September to evaluate EF in the setting of chronic chemotherapy, which showed normal LV function with mild MR and mild to moderate TR.  She presented to the emergency department on October 13 secondary to progressive symptoms.  Here, BNP greater than 4500 with blood pressure of 184/94 on arrival.  She complained of a headache and head CT was unremarkable.  Blood pressure remains elevated in the 180s this morning despite receiving Lasix and metoprolol overnight.  She remains volume overloaded.  She is currently ordered metoprolol or switch this to carvedilol for improved blood pressure effect.  Home losartan dose is already been titrated to 50 mg daily and she is due for dose this morning.  Agree with Lasix 40 mg IV twice daily.  We will look to add SGLT2 inhibitor and potentially spironolactone pending blood pressure response and renal function.  2.  Supply/demand ischemia: Patient without prior history of CAD.  As above, she has been experiencing progressive dyspnea on exertion in setting of poorly controlled hypertension development of lower extremity swelling.  She presented in acute heart failure and in that setting, high-sensitivity troponin has been mildly elevated at  50  57.  She did have 1 episode of chest pressure when walking from her mailbox the other day.  Recent CT of the chest, abdomen, and pelvis, did not show any significant cardiovascular findings/calcium.  Regardless, with mild troponin elevation ischemic evaluation will be warranted and likely can be deferred to the outpatient setting.  Continue beta-blocker.  Add low-dose aspirin.  3.  Macrocytic anemia: Relatively stable.  4.  Ovarian cancer: Followed closely in the cancer center in the setting of outpatient chemotherapy.  Risk Assessment/Risk Scores:        New York Heart Association (NYHA) Functional Class NYHA Class IV    Signed, CMurray Hodgkins NP 01/21/2022, 8:47 AM  For questions or updates, please contact   Please consult [email or web address] for contact info under Cardiology/STEMI.

## 2022-01-21 NOTE — ED Notes (Signed)
Patient transported to Ultrasound 

## 2022-01-21 NOTE — Progress Notes (Addendum)
Progress Note   Patient: Katie Hill ZOX:[id number] DOB: [birth year] DOA: 01/20/2022     1 DOS: the patient was seen and examined on 01/21/2022     Assessment and Plan: * Acute on chronic diastolic CHF (congestive heart failure) (HCC) Last EF normal range.  Continue IV Lasix.  Patient already on Coreg and losartan.  We will add a small dose of Aldactone.  Bilateral pleural effusion If still coughing on Monday can consider a thoracentesis.  Hypertensive urgency At home blood pressure in the 190s.  Continue Coreg losartan and IV Lasix.  Add low-dose Aldactone.  Acute respiratory failure with hypoxia (HCC) Pulse ox in the mid 80s.  On oxygen supplementation.  Check a pulse ox on room air.  Hypothyroidism Continue levothyroxine.  TSH previously pretty elevated.  Will need to follow-up thyroid function as outpatient.  Malignant neoplasm of [facility]) Oncology follow-up as outpatient for  Thrombocytopenia, chemotherapy-induced (HCC) Today's platelet count in the normal range at 173.  Anemia due to antineoplastic chemotherapy Continue to monitor as outpatient  CKD (chronic kidney disease), stage III (HCC) CKD stage IIIa.  Continue to monitor.  Anasarca With proteinuria. Nephrology consulted by admitting physician.   Check COVID and viral respiratory panel.     Subjective: Patient came in with shortness of breath.  States she is breathing a little bit better today than yesterday.  Unable to lie flat without coughing.  Blood pressure has been elevated.  When she walked to the mailbox she thought she was not can make it back.  Physical Exam: Vitals:   01/21/22 0930 01/21/22 0931 01/21/22 0932 01/21/22 0937  BP: (!) 180/96     Pulse: 60 62 61   Resp: '17 19 19   '$ Temp:    98.4 F (36.9 C)  TempSrc:    Oral  SpO2: 98% 99% 99%   Weight:      Height:       Physical Exam HENT:     Head: Normocephalic.     Mouth/Throat:     Pharynx: No oropharyngeal exudate.  Eyes:      General: Lids are normal.     Conjunctiva/sclera: Conjunctivae normal.  Cardiovascular:     Rate and Rhythm: Normal rate and regular rhythm.     Heart sounds: Normal heart sounds, S1 normal and S2 normal.     Comments: She coughs when I lowered the bed flat Pulmonary:     Breath sounds: Examination of the right-lower field reveals decreased breath sounds and rales. Examination of the left-lower field reveals decreased breath sounds and rales. Decreased breath sounds and rales present. No wheezing or rhonchi.  Abdominal:     Palpations: Abdomen is soft.     Tenderness: There is no abdominal tenderness.  Musculoskeletal:     Right lower leg: Swelling present.     Left lower leg: Swelling present.  Skin:    General: Skin is warm.     Findings: No rash.  Neurological:     Mental Status: She is alert and oriented to person, place, and time.     Data Reviewed: BNP greater than 4500, creatinine 0.97 with a GFR of 57, hemoglobin 10.0, platelet count 173  Family Communication: Spoke with husband at the bedside  Disposition: Status is: Inpatient Remains inpatient appropriate because: Being treated for CHF and still symptomatic.  Planned Discharge Destination: Home    Time spent: 28 minutes  Author: Loletha Grayer, MD 01/21/2022 11:39 AM  For on call review [email or web address].

## 2022-01-21 NOTE — ED Notes (Signed)
Pt placed on hospital bed. Sitting in chair currently.

## 2022-01-21 NOTE — Assessment & Plan Note (Addendum)
Pulse ox in the mid 80s.  On oxygen supplementation.  Check a pulse ox on room air.

## 2022-01-21 NOTE — ED Notes (Addendum)
Pt returned from US

## 2022-01-21 NOTE — Consult Note (Signed)
Katie Hill MRN: [id number] DOB/AGE: [birth year] 70 y.o. Primary Care Physician:Masoud, Viann Shove, MD Admit date: 01/20/2022 Chief Complaint:  Chief Complaint  Patient presents with   Hypertension   HPI: Patient is a 71 year old Caucasian female with a past medical history of hypertension, hypothyroidism, ovarian cancer, s/p TAH/BSO and on chemotherapy who came to the ER with chief complaint of lower extremity edema.   Upon evaluation in the ER patient was found to have systolic blood pressure of 186, patient BNP was over 4500, patient was anemic with hemoglobin of 10.2, patient had thrombocytopenia.   Patient creatinine is stable at 1.87.  Patient was admitted for anasarca. Nephrology was consulted for proteinuria History of present illness date backs to February of this year when patient chemotherapy was changed because of presence of proteinuria.   Patient thereafter had Dopplers of lower extremity done in an effort to work-up lower extremity edema.   Patient lower extremity Dopplers were negative Patient 2D echo was repeated and did not show any systolic or diastolic dysfunction Patient complains of having orthopnea.  Patient added the detail that from past few days she has been sleeping on a couch Patient gave a history of coughing while laying flat Patient complaint of shortness of breath with minimal exercise Patient complains of lower extremity swelling off and on from past 6 months Patient gives a history that she has had high blood pressure from past 5 to 6 years Patient denies any over-the-counter NSAID use Patient denies any history of nephrolithiasis     Past Medical History:  Diagnosis Date   (HFpEF) heart failure with preserved ejection fraction ([place])    a. 12/2021 Echo: EF 55%, no rwma, nl RV fxn, mild MR, mild-mod TR. Triv AI.   Dysrhythmia    Essential hypertension    Genetic testing 03/28/2017   Multi-Cancer panel (83 genes) @ [facility] - No pathogenic mutations  detected   High grade ovarian cancer ([place]) 11/20/2016   Pelvic mass in female         Family History  Problem Relation Age of Onset   Throat cancer Cousin    Throat cancer Cousin    Leukemia Cousin     Social History:  reports that she has never smoked. She has never used smokeless tobacco. She reports that she does not currently use alcohol. She reports that she does not use drugs.   Allergies:  Allergies  Allergen Reactions   Omeprazole Rash    (Not in a hospital admission)      GYB:[facility] from the symptoms mentioned above,there are no other symptoms referable to all systems reviewed.   aspirin  81 mg Oral Daily   carvedilol  6.25 mg Oral BID WC   enoxaparin (LOVENOX) injection  40 mg Subcutaneous Q24H   furosemide  40 mg Intravenous BID   levothyroxine  100 mcg Oral Q0600   losartan  50 mg Oral Daily        Physical Exam: Vital signs in last 24 hours: Temp:  [97.8 F (36.6 C)-98.4 F (36.9 C)] 98.4 F (36.9 C) (10/14 0937) Pulse Rate:  [54-99] 61 (10/14 0932) Resp:  [17-24] 19 (10/14 0932) BP: (164-187)/(90-106) 180/96 (10/14 0930) SpO2:  [91 %-100 %] 99 % (10/14 0932) Weight:  [52.6 kg] 52.6 kg (10/13 1849) Weight change:     Intake/Output from previous day: 10/13 0701 - 10/14 0700 In: -  Out: 725 [Urine:725] Total I/O In: -  Out: 400 [Urine:400]   Physical Exam: General- pt is  awake,alert, oriented to time place and person Resp- No acute REsp distress, Minimal rhonchi at bases, decreased breath sounds at bases CVS- S1S2 regular in rate and rhythm GIT- BS+, soft, NT, ND EXT- 2+ LE Edema, Cyanosis CNS- CN 2-12 grossly intact. Moving all 4 extremities Psych- normal mood and affect    Lab Results: CBC Recent Labs    01/20/22 1859 01/21/22 0001  WBC 4.7 5.1  HGB 10.2* 10.0*  HCT 32.0* 30.3*  PLT 138* 173    BMET Recent Labs    01/20/22 1859 01/21/22 0001  NA 137  --   K 3.7  --   CL 105  --   CO2 23  --   GLUCOSE 115*   --   BUN 26*  --   CREATININE 1.07* 0.97  CALCIUM 9.9  --     MICRO No results found for this or any previous visit (from the past 240 hour(s)).    Lab Results  Component Value Date   CALCIUM 9.9 01/20/2022    Addendum Patient renal ultrasound done on January 21, 2022 was reviewed FINDINGS: Right Kidney:   Renal measurements: 11.9 x 4.7 x 5.4 cm = volume: 157.2 mL. Echogenicity within normal limits. No mass or hydronephrosis visualized.   Left Kidney:   Renal measurements: 11.4 x 4.4 x 5.4 cm = volume: 144.2 mL. Echogenicity within normal limits. No mass or hydronephrosis visualized.   Bladder:   Appears normal for degree of bladder distention.   Other:   Bilateral pleural effusions.   IMPRESSION: Normal sonographic appearance of the kidneys.  Normal bladder.     Impression:  1)Renal    Patient has CKD stage II Patient has CKD stage II secondary to unclear etiology at this time Patient has had proteinuria going back to February of this year   2)Uncontrolled hypertension/hypertensive urgency  Patient is currently on Coreg 6.25 mg p.o. twice daily Lasix 40 mg IV twice daily Losartan 50 mg p.o. daily There is a possibility of renal artery stenosis as patient has had recent worsening of her hypertension  Regarding work-up for her ischemic nephropathy with the patient need CT angio versus catheterization. In case the cardiology decides for patient to have cardiac catheterization then we will discuss the case with them to also look for renal artery stenosis if possible at the same time   3)Anemia of chronic disease/Macrocytic anemia HGb at goal (9--11) Hematology/oncology is following  4)Anasarca Etiology Renal versus cardiac  versus vascular Most likely cardiac   Addendum patient protein/creatinine ratio came back at 1.4 g patient anasarca is unlikely to be secondary to proteinuria  We will ask for 24-hour urine collection to look for patient's  proteinuria -We will request to repeat patient's 2D echo We will hold off on asking for CT angio chest and abdomen to look for intravascular issues Avastin does cause intra-abdominal venous thrombosis As patient informed me that she has not received Avastin for few months   5)Thrombocytopenia Patient platelet counts are stable   6)Malignant neoplasm of ovary Patient being followed by oncology   7)Acid base Co2 at goal   8)Proteinuria Patient's portal sample shows nonnephrotic range proteinuria We will quantify patient proteinuria    Plan:   We will ask for renal ultrasound-reviewed, rule out obstructive issues We will ask for 24 hours urine collection for proteinuria We will ask for SPEP/UPEP/immuno electrophoresis    Cleo Villamizar s Noreta Kue 01/21/2022, 10:49 AM

## 2022-01-21 NOTE — Assessment & Plan Note (Signed)
No rales heard on exam today.

## 2022-01-21 NOTE — Assessment & Plan Note (Signed)
CKD stage IIIa.  Creatinine 1.17 upon discharge.  Recommend checking BMP and follow-up appointment.  Lasix held on the day of discharge.

## 2022-01-21 NOTE — ED Notes (Signed)
Pt oob in chair. NAD

## 2022-01-22 ENCOUNTER — Other Ambulatory Visit: Payer: Self-pay

## 2022-01-22 DIAGNOSIS — I5033 Acute on chronic diastolic (congestive) heart failure: Secondary | ICD-10-CM | POA: Diagnosis not present

## 2022-01-22 DIAGNOSIS — R001 Bradycardia, unspecified: Secondary | ICD-10-CM | POA: Diagnosis not present

## 2022-01-22 DIAGNOSIS — I160 Hypertensive urgency: Secondary | ICD-10-CM | POA: Diagnosis not present

## 2022-01-22 DIAGNOSIS — E876 Hypokalemia: Secondary | ICD-10-CM | POA: Diagnosis not present

## 2022-01-22 LAB — PROTEIN, URINE, 24 HOUR
Collection Interval-UPROT: 24 hours
Protein, 24H Urine: 1026 mg/d — ABNORMAL HIGH (ref 50–100)
Protein, Urine: 36 mg/dL
Urine Total Volume-UPROT: 2850 mL

## 2022-01-22 LAB — BASIC METABOLIC PANEL
Anion gap: 8 (ref 5–15)
Anion gap: 6 (ref 5–15)
BUN: 21 mg/dL (ref 8–23)
BUN: 18 mg/dL (ref 8–23)
CO2: 24 mmol/L (ref 22–32)
CO2: 25 mmol/L (ref 22–32)
Calcium: 8.9 mg/dL (ref 8.9–10.3)
Calcium: 9.1 mg/dL (ref 8.9–10.3)
Chloride: 105 mmol/L (ref 98–111)
Chloride: 103 mmol/L (ref 98–111)
Creatinine, Ser: 1.03 mg/dL — ABNORMAL HIGH (ref 0.44–1.00)
Creatinine, Ser: 0.89 mg/dL (ref 0.44–1.00)
GFR, Estimated: 59 mL/min — ABNORMAL LOW (ref >60)
GFR, Estimated: >60 mL/min (ref >60)
Glucose, Bld: 97 mg/dL (ref 70–99)
Glucose, Bld: 112 mg/dL — ABNORMAL HIGH (ref 70–99)
Potassium: 2.6 mmol/L — CL (ref 3.5–5.1)
Potassium: 4.1 mmol/L (ref 3.5–5.1)
Sodium: 137 mmol/L (ref 135–145)
Sodium: 134 mmol/L — ABNORMAL LOW (ref 135–145)

## 2022-01-22 LAB — CBC
HCT: 30.2 % — ABNORMAL LOW (ref 36.0–46.0)
Hemoglobin: 9.9 g/dL — ABNORMAL LOW (ref 12.0–15.0)
MCH: 34.4 pg — ABNORMAL HIGH (ref 26.0–34.0)
MCHC: 32.8 g/dL (ref 30.0–36.0)
MCV: 104.9 fL — ABNORMAL HIGH (ref 80.0–100.0)
Platelets: 144 10*3/uL — ABNORMAL LOW (ref 150–400)
RBC: 2.88 MIL/uL — ABNORMAL LOW (ref 3.87–5.11)
RDW: 14 % (ref 11.5–15.5)
WBC: 3.4 10*3/uL — ABNORMAL LOW (ref 4.0–10.5)
nRBC: 0 % (ref 0.0–0.2)

## 2022-01-22 LAB — RESPIRATORY PANEL BY PCR

## 2022-01-22 LAB — CREATININE, URINE, 24 HOUR
Collection Interval-UCRE24: 24 hours
Creatinine, 24H Ur: 884 mg/d (ref 600–1800)
Creatinine, Urine: 31 mg/dL
Urine Total Volume-UCRE24: 2850 mL

## 2022-01-22 LAB — MAGNESIUM
Magnesium: 2 mg/dL (ref 1.7–2.4)
Magnesium: 1.8 mg/dL (ref 1.7–2.4)

## 2022-01-22 MED ORDER — MAGNESIUM SULFATE 2 GM/50ML IV SOLN
2.0000 g | Freq: Once | INTRAVENOUS | Status: AC
  Administered 2022-01-22: 2 g via INTRAVENOUS
  Filled 2022-01-22: qty 50

## 2022-01-22 MED ORDER — CALCIUM GLUCONATE-NACL 1-0.675 GM/50ML-% IV SOLN
1.0000 g | Freq: Once | INTRAVENOUS | Status: AC
  Administered 2022-01-22: 1000 mg via INTRAVENOUS
  Filled 2022-01-22: qty 50

## 2022-01-22 MED ORDER — POTASSIUM CHLORIDE CRYS ER 20 MEQ PO TBCR
40.0000 meq | EXTENDED_RELEASE_TABLET | Freq: Once | ORAL | Status: AC
  Administered 2022-01-22: 40 meq via ORAL
  Filled 2022-01-22: qty 2

## 2022-01-22 MED ORDER — CARVEDILOL 3.125 MG PO TABS: 3.1250 mg | ORAL_TABLET | Freq: Two times a day (BID) | ORAL | Status: DC

## 2022-01-22 MED ORDER — POTASSIUM CHLORIDE 10 MEQ/100ML IV SOLN
10.0000 meq | INTRAVENOUS | Status: AC
  Administered 2022-01-22 (×4): 10 meq via INTRAVENOUS
  Filled 2022-01-22: qty 100

## 2022-01-22 MED ORDER — CHLORHEXIDINE GLUCONATE CLOTH 2 % EX PADS
6.0000 | MEDICATED_PAD | Freq: Every day | CUTANEOUS | Status: DC
  Administered 2022-01-23: 6 via TOPICAL

## 2022-01-22 MED ORDER — POTASSIUM CHLORIDE CRYS ER 10 MEQ PO TBCR
10.0000 meq | EXTENDED_RELEASE_TABLET | Freq: Two times a day (BID) | ORAL | Status: DC
  Administered 2022-01-22: 10 meq via ORAL
  Filled 2022-01-22: qty 1

## 2022-01-22 MED ORDER — HYDRALAZINE HCL 10 MG PO TABS
10.0000 mg | ORAL_TABLET | Freq: Three times a day (TID) | ORAL | Status: DC
  Administered 2022-01-22 – 2022-01-23 (×4): 10 mg via ORAL
  Filled 2022-01-22 (×5): qty 1

## 2022-01-22 NOTE — ED Notes (Signed)
Up to room commode, steady gait, urine collected. 3rd KCL run infusing.

## 2022-01-22 NOTE — ED Notes (Signed)
Pt with extreme bradyacardia noted, HR 28. RN to bedside. Pt states "I really don't feel good." Pt pale and reaching for chest. Dr. Leslye Peer and charge RN notified. EKG obtained.

## 2022-01-22 NOTE — Progress Notes (Signed)
Progress Note   Patient: Katie Hill PIR:[id number] DOB: 11-06-1950 DOA: 01/20/2022     2 DOS: the patient was seen and examined on 01/22/2022     Assessment and Plan: * Acute on chronic diastolic CHF (congestive heart failure) (HCC) Last EF normal range.  Continue IV Lasix today.  Holding Coreg after transient bradycardia episode.  Continue losartan and spironolactone.  Added low-dose hydralazine  Sinus bradycardia Patient had transient bradycardia and felt like she was going to pass out.  We will hold beta-blocker give IV calcium.  Could be vasovagal in nature.  Nausea medication given.  Hypokalemia Replaced aggressively today with IV and orally and now in the normal range.  Hypertensive urgency At home blood pressure in the 190s.  Patient on IV Lasix, hydralazine, losartan and spironolactone.  Holding Coreg with transient bradycardia episodes  Bilateral pleural effusion If still coughing on Monday can consider a thoracentesis.  Acute respiratory failure with hypoxia (HCC) Pulse ox in the mid 80s on presentation.  Able to come off oxygen.  Hypothyroidism Continue levothyroxine.  TSH previously pretty elevated.  Will need to follow-up thyroid function as outpatient.  Malignant neoplasm of ovary [facility]) Oncology follow-up as outpatient.  Thrombocytopenia, chemotherapy-induced (HCC) Today's platelet count in the normal range at 173.  Anemia due to antineoplastic chemotherapy Continue to monitor as outpatient  CKD (chronic kidney disease), stage III (HCC) CKD stage IIIa.  Continue to monitor.  Anasarca With proteinuria. Nephrology consulted by admitting physician.        Subjective: Patient feeling better and willing to go home but then had 2 episodes of bradycardia where she did not feel well.  Beta-blocker was switched over to Coreg yesterday.  Initially came in with heart failure.  Physical Exam: Vitals:   01/22/22 1200 01/22/22 1230 01/22/22 1330 01/22/22  1400  BP: (!) 167/82 (!) 153/76 (!) 143/104 (!) 170/78  Pulse: 89 74 63 76  Resp: 18 (!) 23 (!) 26 20  Temp:   98.1 F (36.7 C)   TempSrc:   Oral   SpO2: 97% 96% 97% 95%  Weight:      Height:       Physical Exam HENT:     Head: Normocephalic.     Mouth/Throat:     Pharynx: No oropharyngeal exudate.  Eyes:     General: Lids are normal.     Conjunctiva/sclera: Conjunctivae normal.  Cardiovascular:     Rate and Rhythm: Normal rate and regular rhythm.     Heart sounds: Normal heart sounds, S1 normal and S2 normal.     Comments: She coughs when I lowered the bed flat Pulmonary:     Breath sounds: Examination of the right-lower field reveals decreased breath sounds. Examination of the left-lower field reveals decreased breath sounds. Decreased breath sounds present. No wheezing, rhonchi or rales.  Abdominal:     Palpations: Abdomen is soft.     Tenderness: There is no abdominal tenderness.  Musculoskeletal:     Right lower leg: Swelling present.     Left lower leg: Swelling present.  Skin:    General: Skin is warm.     Findings: No rash.  Neurological:     Mental Status: She is alert and oriented to person, place, and time.     Data Reviewed: This morning's potassium 2.6, but significantly and potassium 4.1.  Creatinine down to 0.89, sodium 134.  Last hemoglobin 9.9 and platelet count 144 and white blood cell count 3.4.  Family Communication: Spoke  with husband at the bedside  Disposition: Status is: Inpatient Remains inpatient appropriate because: Patient had 2 episodes of transient bradycardia and feeling like she is going to pass out with heart rate dropping into the 30s and 40s.  Planned Discharge Destination: Home    Time spent: 35 minutes  Author: Loletha Grayer, MD 01/22/2022 2:38 PM  For on call review [email or web address].

## 2022-01-22 NOTE — ED Notes (Signed)
MD at [facility]. Husband at [facility]. Pt alert, NAD, calm, interactive, resps e/u, speaking clearly. IV hydralazine given.

## 2022-01-22 NOTE — ED Notes (Signed)
Advised nurse that patient has bed

## 2022-01-22 NOTE — ED Notes (Signed)
Back to bed, steady gait, alert, NAD, calm, 24 hr urine continues, husband at [facility], 2nd KCL run infusing.

## 2022-01-22 NOTE — Progress Notes (Signed)
Rounding Note    Patient Name: Katie Hill Date of Encounter: 01/22/2022  [place] Cardiologist: None New to [facility]  Subjective   Reports that she is feeling poorly today. Notes that stomach feels bad, has nausea but has not had emesis. Tolerated only part of a baked potato at lunch. Just overall feeling terrible. Breathing unchanged.  Inpatient Medications    Scheduled Meds:  aspirin  81 mg Oral Daily   [START ON 01/23/2022] Chlorhexidine Gluconate Cloth  6 each Topical Q0600   enoxaparin (LOVENOX) injection  40 mg Subcutaneous Q24H   furosemide  40 mg Intravenous BID   hydrALAZINE  10 mg Oral TID   levothyroxine  100 mcg Oral Q0600   losartan  100 mg Oral Daily   potassium chloride  10 mEq Oral BID   spironolactone  25 mg Oral Daily   Continuous Infusions:  magnesium sulfate bolus IVPB 2 g (01/22/22 1711)   PRN Meds: acetaminophen **OR** acetaminophen, hydrALAZINE, HYDROcodone-acetaminophen, ondansetron **OR** ondansetron (ZOFRAN) IV   Vital Signs    Vitals:   01/22/22 1330 01/22/22 1400 01/22/22 1657 01/22/22 1730  BP: (!) 143/104 (!) 170/78 (!) 179/96   Pulse: 63 76 90   Resp: (!) '26 20 18 16  '$ Temp: 98.1 F (36.7 C)  98.1 F (36.7 C)   TempSrc: Oral  Oral   SpO2: 97% 95% 97%   Weight:      Height:        Intake/Output Summary (Last 24 hours) at 01/22/2022 1742 Last data filed at 01/22/2022 1730 Gross per 24 hour  Intake 456.03 ml  Output 1500 ml  Net -1043.97 ml      01/20/2022    6:49 PM 01/13/2022    9:02 AM 01/06/2022    9:34 AM  Last 3 Weights  Weight (lbs) 116 lb 118 lb 118 lb 9.6 oz  Weight (kg) 52.617 kg 53.524 kg 53.797 kg      Telemetry    Sinus with intermittent, brief episodes of junctional rhythm, see below - Personally Reviewed  ECG    Sinus rhythm with sinus arrhythmia - Personally Reviewed  Physical Exam   GEN: No acute distress.   Neck: JVD not visible today sitting upright Cardiac: RRR, no murmurs,  rubs, or gallops.  Respiratory: Bibasilar rales GI: Soft, nontender, non-distended  MS: bilateral LE 1+ edema; No deformity. Neuro:  Nonfocal  Psych: Normal affect   Labs    High Sensitivity Troponin:   Recent Labs  Lab 01/20/22 1859 01/21/22 0001  TROPONINIHS 50* 57*     Chemistry Recent Labs  Lab 01/20/22 1859 01/21/22 0001 01/22/22 0417 01/22/22 1348  NA 137  --  137 134*  K 3.7  --  2.6* 4.1  CL 105  --  105 103  CO2 23  --  24 25  GLUCOSE 115*  --  97 112*  BUN 26*  --  21 18  CREATININE 1.07* 0.97 1.03* 0.89  CALCIUM 9.9  --  8.9 9.1  MG  --   --  2.0 1.8  GFRNONAA 56* >60 59* >60  ANIONGAP 9  --  8 6    Lipids No results for input(s): "CHOL", "TRIG", "HDL", "LABVLDL", "LDLCALC", "CHOLHDL" in the last 168 hours.  Hematology Recent Labs  Lab 01/20/22 1859 01/21/22 0001 01/22/22 0417  WBC 4.7 5.1 3.4*  RBC 2.98* 2.85* 2.88*  HGB 10.2* 10.0* 9.9*  HCT 32.0* 30.3* 30.2*  MCV 107.4* 106.3* 104.9[facility]  34.2* 35.1* 34.4*  MCHC 31.9 33.0 32.8  RDW 14.4 14.2 14.0  PLT 138* 173 144*   Thyroid No results for input(s): "TSH", "FREET4" in the last 168 hours.  BNP Recent Labs  Lab 01/20/22 1859  BNP >4,500.0*    DDimer No results for input(s): "DDIMER" in the last 168 hours.   Radiology    US RENAL  Result Date: 01/21/2022 CLINICAL DATA:  Edema. EXAM: RENAL / URINARY TRACT ULTRASOUND COMPLETE COMPARISON:  None Available. FINDINGS: Right Kidney: Renal measurements: 11.9 x 4.7 x 5.4 cm = volume: 157.2 mL. Echogenicity within normal limits. No mass or hydronephrosis visualized. Left Kidney: Renal measurements: 11.4 x 4.4 x 5.4 cm = volume: 144.2 mL. Echogenicity within normal limits. No mass or hydronephrosis visualized. Bladder: Appears normal for degree of bladder distention. Other: Bilateral pleural effusions. IMPRESSION: Normal sonographic appearance of the kidneys.  Normal bladder. Electronically Signed   By: Lajean Manes M.D.   On: 01/21/2022 13:06     Cardiac Studies   Echo 12/23/21 1. Left ventricular ejection fraction, by estimation, is 55%. The left  ventricle has normal function. The left ventricle has no regional wall  motion abnormalities. The average left ventricular global longitudinal  strain is -13.5 %.   2. Right ventricular systolic function is normal. The right ventricular  size is normal.   3. The mitral valve is normal in structure. Mild mitral valve  regurgitation. No evidence of mitral stenosis.   4. Tricuspid valve regurgitation is mild to moderate.   5. The aortic valve is tricuspid. Aortic valve regurgitation is trivial.  No aortic stenosis is present.   6. The inferior vena cava is normal in size with greater than 50%  respiratory variability, suggesting right atrial pressure of 3 mmHg.   Patient Profile     71 y.o. female with PMH ovarian cancer, hypertension, hypothyroidism admitted for volume overload. Cardiology asked to see for acute diastolic heart failure and elevated troponin.  Assessment & Plan    Brief episodic bradycardia -reviewed telemetry. She has normal sinus rhythm until specific moments, at which time she drops her p wave and has several junctional beats, then returning to sinus. Given her nausea and overall discomfort, my suspicion is that these are vagal in nature. They are very brief events, so brief I would not expect them to be the cause of her symptoms. Reasonable to decrease or hold beta blocker to avoid confusion with this. She was not having these when she was feeling better yesterday.  Acute diastolic heart failure Hypertension -volume status improving -Overall picture looks more like third spacing. However, her albumin isn't severely low. She does have proteinuria, workup in progress, 24 hour urine with 1 gm protein/day -once renal workup complete, consider changing losartan to entresto, or adding SGLT2i -added spironolactone   Elevated troponin -mildly elevated and flat.  Reports single episode of chest pressure. Suspect this is in the setting of volume overload. Defer further workup to outpatient setting -adding aspirin but low threshold to stop if her platelets drop or she develops bleeding. Chemotherapy is on hold, but aspirin may need to be held once this is restarted.  For questions or updates, please contact [place] Please consult [email or web address] for contact info under        Signed, Buford Dresser, MD  01/22/2022, 5:42 PM

## 2022-01-22 NOTE — ED Notes (Signed)
Pt alert, NAD, calm, interactive, resps e/u, speaking in clear complete sentences. C/o HA, denies other pain, sob, nausea or dizziness. Husband at [facility]. Pt collecting 24hr urine.

## 2022-01-22 NOTE — ED Notes (Signed)
Pt brady to 87

## 2022-01-22 NOTE — ED Notes (Signed)
Provider notified via telephone of critical potassium of 2.6.  Provider states she will put in potassium replacement.

## 2022-01-22 NOTE — ED Notes (Signed)
Pt with bradycardia to 35 BPM. RN and Dr. Leslye Peer to bedside. Pt complaining she "doesn't feel good" and is "nauseous"

## 2022-01-22 NOTE — Assessment & Plan Note (Signed)
Replaced aggressively today with IV and orally and now in the normal range.

## 2022-01-22 NOTE — ED Notes (Signed)
Pt bradycardia noted to 38

## 2022-01-22 NOTE — ED Notes (Signed)
MD to bedside and states she thinks pt is vagaling due to nausea. Will give zofran

## 2022-01-22 NOTE — ED Notes (Signed)
Pt bradycardia noted to 30

## 2022-01-22 NOTE — Progress Notes (Signed)
Katie Hill  MRN: [id number]  DOB/AGE: 1950-04-13 71 y.o.  Primary Care Physician:Masoud, Viann Shove, MD  Admit date: 01/20/2022  Chief Complaint:  Chief Complaint  Patient presents with   Hypertension    S-Pt presented on  01/20/2022 with  Chief Complaint  Patient presents with   Hypertension  .  Patient is a 71 year old Caucasian female with a past medical history of hypertension, hypothyroidism, ovarian cancer, s/p TAH/BSO and on chemotherapy who came to the ER with chief complaint of lower extremity edema.     Upon evaluation in the ER patient was found to have systolic blood pressure of 186, patient BNP was over 4500, patient was anemic with hemoglobin of 10.2, patient had thrombocytopenia.   Patient was admitted for anasarca. Nephrology was consulted for proteinuria.  Patient was seen today in the ER. Patient main complaint was that she was feeling better than yesterday  Medications  aspirin  81 mg Oral Daily   carvedilol  6.25 mg Oral BID WC   enoxaparin (LOVENOX) injection  40 mg Subcutaneous Q24H   furosemide  40 mg Intravenous BID   hydrALAZINE  10 mg Oral TID   levothyroxine  100 mcg Oral Q0600   losartan  100 mg Oral Daily   spironolactone  25 mg Oral Daily         YNW:GNFAO from the symptoms mentioned above,there are no other symptoms referable to all systems reviewed.  Physical Exam: Vital signs in last 24 hours: Temp:  [97.8 F (36.6 C)-98.2 F (36.8 C)] 97.8 F (36.6 C) (10/15 1112) Pulse Rate:  [55-89] 74 (10/15 1230) Resp:  [6-24] 23 (10/15 1230) BP: (153-192)/(76-126) 153/76 (10/15 1230) SpO2:  [93 %-100 %] 96 % (10/15 1230) Weight change:     Intake/Output from previous day: 10/14 0701 - 10/15 0700 In: -  Out: 1950 [Urine:1950] Total I/O In: 400 [IV Piggyback:400] Out: 925 [Urine:925]   Physical Exam:  General- pt is awake,alert, oriented to time place and person  Resp- No acute REsp distress, CTA B/L NO Rhonchi  CVS-  S1S2 regular in rate and rhythm  GIT- BS+, soft, Non tender , Non distended  EXT- 1+ LE Edema,  No Cyanosis    Lab Results:  CBC  Recent Labs    01/21/22 0001 01/22/22 0417  WBC 5.1 3.4*  HGB 10.0* 9.9*  HCT 30.3* 30.2*  PLT 173 144*    BMET  Recent Labs    01/20/22 1859 01/21/22 0001 01/22/22 0417  NA 137  --  137  K 3.7  --  2.6*  CL 105  --  105  CO2 23  --  24  GLUCOSE 115*  --  97  BUN 26*  --  21  CREATININE 1.07* 0.97 1.03*  CALCIUM 9.9  --  8.9      Most recent Creatinine trend  Lab Results  Component Value Date   CREATININE 1.03 (H) 01/22/2022   CREATININE 0.97 01/21/2022   CREATININE 1.07 (H) 01/20/2022      MICRO   Recent Results (from the past 240 hour(s))  SARS Coronavirus 2 by RT PCR (hospital order, performed in [facility] hospital lab) *cepheid single result test* Anterior Nasal Swab     Status: None   Collection Time: 01/21/22  5:29 PM   Specimen: Anterior Nasal Swab  Result Value Ref Range Status   SARS Coronavirus 2 by RT PCR NEGATIVE NEGATIVE Final    Comment: (NOTE) SARS-CoV-2 target nucleic acids are NOT DETECTED.  The SARS-CoV-2 RNA is generally detectable in upper and lower respiratory specimens during the acute phase of infection. The lowest concentration of SARS-CoV-2 viral copies this assay can detect is 250 copies / mL. A negative result does not preclude SARS-CoV-2 infection and should not be used as the sole basis for treatment or other patient management decisions.  A negative result may occur with improper specimen collection / handling, submission of specimen other than nasopharyngeal swab, presence of viral mutation(s) within the areas targeted by this assay, and inadequate number of viral copies (<250 copies / mL). A negative result must be combined with clinical observations, patient history, and epidemiological information.  Fact Sheet for Patients:   https://www.patel.info/  Fact  Sheet for Healthcare Providers: https://hall.com/  This test is not yet approved or  cleared by the [place] FDA and has been authorized for detection and/or diagnosis of SARS-CoV-2 by FDA under an Emergency Use Authorization (EUA).  This EUA will remain in effect (meaning this test can be used) for the duration of the COVID-19 declaration under Section 564(b)(1) of the Act, 21 U.S.C. section 360bbb-3(b)(1), unless the authorization is terminated or revoked sooner.  Performed at [facility], [address], [place], [place] [address]          Impression:   1)Renal     Patient has CKD stage 3a Patient has CKD stage 3a  Patient has CKD most likely from hypertension Patient has had proteinuria going back to February of this year     2)Uncontrolled hypertension/hypertensive urgency   Patient is currently on Coreg 6.25 mg p.o. twice daily Lasix 40 mg IV twice daily Losartan 50 mg p.o. daily There is a possibility of renal artery stenosis as patient has had recent worsening of her hypertension   Regarding work-up for her ischemic nephropathy with the patient need CT angio versus catheterization. In case the cardiology decides for patient to have cardiac catheterization then we will discuss the case with them to also look for renal artery stenosis if possible at the same time    Patient blood pressure is better controlled than yesterday    3)Anemia of chronic disease/Macrocytic anemia HGb at goal (9--11) Hematology/oncology is following      Latest Ref Rng & Units 01/22/2022    4:17 AM 01/21/2022   12:01 AM 01/20/2022    6:59 PM  CBC  WBC 4.0 - 10.5 K/uL 3.4  5.1  4.7   Hemoglobin 12.0 - 15.0 g/dL 9.9  10.0  10.2   Hematocrit 36.0 - 46.0 % 30.2  30.3  32.0   Platelets 150 - 400 K/uL 144  173  138         4) Secondary hyperparathyroidism -CKD Mineral-Bone Disorder    Lab Results  Component Value Date   CALCIUM 8.9  01/22/2022    Phosphorus at goal.   5)Anasarca Most likely cardiac    Unlikely to be renal associated as patient protein/creatinine ratio came back at 1.4 g patient anasarca is unlikely to be secondary to proteinuria   We will ask for 24-hour urine collection to look for patient's proteinuria -We will request to repeat patient's 2D echo We will hold off on asking for CT angio chest and abdomen to look for intravascular issues Avastin does cause intra-abdominal venous thrombosis As patient informed me that she has not received Avastin for few months     6) Electrolytes      Latest Ref Rng & Units 01/22/2022  4:17 AM 01/21/2022   12:01 AM 01/20/2022    6:59 PM  BMP  Glucose 70 - 99 mg/dL 97   115   BUN 8 - 23 mg/dL 21   26   Creatinine 0.44 - 1.00 mg/dL 1.03  0.97  1.07   Sodium 135 - 145 mmol/L 137   137   Potassium 3.5 - 5.1 mmol/L 2.6   3.7   Chloride 98 - 111 mmol/L 105   105   CO2 22 - 32 mmol/L 24   23   Calcium 8.9 - 10.3 mg/dL 8.9   9.9      Sodium Normonatremic   Potassium Hypokalemia Being replete and patient has also been added on spironolactone    7)Acid base   Co2 at goal  8)Thrombocytopenia  patient platelet counts are stable   Plan:   I agree with potassium replacement and spironolactone. We will continue patient on current treatment Awaiting 24-hour urine protein results I did discuss the patient about possibly requiring renal biopsy as an outpatient/later on depending upon the results     Katie Hill s Katie Hill 01/22/2022, 1:19 PM

## 2022-01-22 NOTE — Assessment & Plan Note (Signed)
Patient had transient bradycardia and felt like she was going to pass out.  We will hold beta-blocker give IV calcium.  Could be vasovagal in nature.  Nausea medication given.

## 2022-01-22 NOTE — ED Notes (Signed)
Dr Wieting at bedside 

## 2022-01-22 NOTE — ED Notes (Signed)
24hr urine collection sent to lab, 1100 Saturday to 1100 Sunday.

## 2022-01-22 NOTE — ED Notes (Signed)
Pt ambulatory to restroom without assistance 

## 2022-01-23 DIAGNOSIS — E440 Moderate protein-calorie malnutrition: Secondary | ICD-10-CM | POA: Insufficient documentation

## 2022-01-23 LAB — BASIC METABOLIC PANEL
Anion gap: 6 (ref 5–15)
BUN: 22 mg/dL (ref 8–23)
CO2: 25 mmol/L (ref 22–32)
Calcium: 9.3 mg/dL (ref 8.9–10.3)
Chloride: 107 mmol/L (ref 98–111)
Creatinine, Ser: 1.17 mg/dL — ABNORMAL HIGH (ref 0.44–1.00)
GFR, Estimated: 51 mL/min — ABNORMAL LOW (ref >60)
Glucose, Bld: 88 mg/dL (ref 70–99)
Potassium: 4.1 mmol/L (ref 3.5–5.1)
Sodium: 138 mmol/L (ref 135–145)

## 2022-01-23 LAB — CBC
HCT: 30.9 % — ABNORMAL LOW (ref 36.0–46.0)
Hemoglobin: 10 g/dL — ABNORMAL LOW (ref 12.0–15.0)
MCH: 34.1 pg — ABNORMAL HIGH (ref 26.0–34.0)
MCHC: 32.4 g/dL (ref 30.0–36.0)
MCV: 105.5 fL — ABNORMAL HIGH (ref 80.0–100.0)
Platelets: 145 10*3/uL — ABNORMAL LOW (ref 150–400)
RBC: 2.93 MIL/uL — ABNORMAL LOW (ref 3.87–5.11)
RDW: 14.1 % (ref 11.5–15.5)
WBC: 3.8 10*3/uL — ABNORMAL LOW (ref 4.0–10.5)
nRBC: 0 % (ref 0.0–0.2)

## 2022-01-23 MED ORDER — ADULT MULTIVITAMIN W/MINERALS CH: 1.0000 | ORAL_TABLET | Freq: Every day | ORAL | Status: DC

## 2022-01-23 MED ORDER — HYDRALAZINE HCL 25 MG PO TABS: 25.0000 mg | ORAL_TABLET | Freq: Three times a day (TID) | ORAL | 0 refills | Status: DC

## 2022-01-23 MED ORDER — METOPROLOL SUCCINATE ER 25 MG PO TB24: 12.5000 mg | ORAL_TABLET | Freq: Every day | ORAL | 0 refills | Status: DC

## 2022-01-23 MED ORDER — FUROSEMIDE 40 MG PO TABS: ORAL_TABLET | ORAL | 0 refills | Status: DC

## 2022-01-23 MED ORDER — SPIRONOLACTONE 25 MG PO TABS: 25.0000 mg | ORAL_TABLET | Freq: Every day | ORAL | 0 refills | Status: DC

## 2022-01-23 MED ORDER — HYDRALAZINE HCL 25 MG PO TABS
25.0000 mg | ORAL_TABLET | Freq: Three times a day (TID) | ORAL | Status: DC
  Administered 2022-01-23: 25 mg via ORAL
  Filled 2022-01-23: qty 1

## 2022-01-23 MED ORDER — ASPIRIN 81 MG PO CHEW: 81.0000 mg | CHEWABLE_TABLET | Freq: Every day | ORAL | 0 refills | Status: AC

## 2022-01-23 MED ORDER — ADULT MULTIVITAMIN W/MINERALS CH
1.0000 | ORAL_TABLET | Freq: Every day | ORAL | Status: DC
  Administered 2022-01-23: 1 via ORAL
  Filled 2022-01-23: qty 1

## 2022-01-23 MED ORDER — LOSARTAN POTASSIUM 100 MG PO TABS: 100.0000 mg | ORAL_TABLET | Freq: Every day | ORAL | 0 refills | Status: DC

## 2022-01-23 MED ORDER — ENSURE ENLIVE PO LIQD: 237.0000 mL | Freq: Three times a day (TID) | ORAL | Status: DC

## 2022-01-23 MED ORDER — ENSURE ENLIVE PO LIQD: 237.0000 mL | Freq: Three times a day (TID) | ORAL | 0 refills | Status: AC

## 2022-01-23 MED ORDER — HEPARIN SOD (PORK) LOCK FLUSH 100 UNIT/ML IV SOLN
500.0000 [IU] | Freq: Once | INTRAVENOUS | Status: AC
  Administered 2022-01-23: 500 [IU] via INTRAVENOUS
  Filled 2022-01-23: qty 5

## 2022-01-23 NOTE — Progress Notes (Signed)
Katie Hill  MRN: [id number]  DOB/AGE: 70/14/1952 70 y.o.  Primary Care Physician:Masoud, Viann Shove, MD  Admit date: 01/20/2022  Chief Complaint:  Chief Complaint  Patient presents with   Hypertension    S-Pt presented on  01/20/2022 with  Chief Complaint  Patient presents with   Hypertension  .  Patient is a 71 year old Caucasian female with a past medical history of hypertension, hypothyroidism, ovarian cancer, s/p TAH/BSO and on chemotherapy who came to the ER with chief complaint of lower extremity edema.     Upon evaluation in the ER patient was found to have systolic blood pressure of 186, patient BNP was over 4500, patient was anemic with hemoglobin of 10.2, patient had thrombocytopenia.   Patient was admitted for anasarca. Nephrology was consulted for proteinuria.  Patient was seen today in the second floor. Patient informed me that she was feeling much better than yesterday. Patient pointed towards her legs that look my swelling is so much better   Medications  aspirin  81 mg Oral Daily   Chlorhexidine Gluconate Cloth  6 each Topical Q0600   enoxaparin (LOVENOX) injection  40 mg Subcutaneous Q24H   furosemide  40 mg Intravenous BID   hydrALAZINE  10 mg Oral TID   levothyroxine  100 mcg Oral Q0600   losartan  100 mg Oral Daily   potassium chloride  10 mEq Oral BID   spironolactone  25 mg Oral Daily         QHU:TMLYY from the symptoms mentioned above,there are no other symptoms referable to all systems reviewed.  Physical Exam: Vital signs in last 24 hours: Temp:  [97.8 F (36.6 C)-98.5 F (36.9 C)] 98.1 F (36.7 C) (10/16 0429) Pulse Rate:  [55-90] 86 (10/16 0630) Resp:  [6-26] 15 (10/16 0429) BP: (143-192)/(75-126) 157/94 (10/16 0630) SpO2:  [93 %-99 %] 93 % (10/16 0630) Weight:  [49.9 kg] 49.9 kg (10/16 0429) Weight change:     Intake/Output from previous day: 10/15 0701 - 10/16 0700 In: 456 [IV Piggyback:456] Out: 925 [Urine:925] No  intake/output data recorded.   Physical Exam:  General- pt is awake,alert, oriented to time place and person  Resp- No acute REsp distress, CTA B/L NO Rhonchi  CVS- S1S2 regular in rate and rhythm  GIT- BS+, soft, Non tender , Non distended  EXT- Trace LE Edema,  No Cyanosis    Lab Results:  CBC  Recent Labs    01/22/22 0417 01/23/22 0520  WBC 3.4* 3.8*  HGB 9.9* 10.0*  HCT 30.2* 30.9*  PLT 144* 145*    BMET  Recent Labs    01/22/22 0417 01/22/22 1348  NA 137 134*  K 2.6* 4.1  CL 105 103  CO2 24 25  GLUCOSE 97 112*  BUN 21 18  CREATININE 1.03* 0.89  CALCIUM 8.9 9.1      Most recent Creatinine trend  Lab Results  Component Value Date   CREATININE 0.89 01/22/2022   CREATININE 1.03 (H) 01/22/2022   CREATININE 0.97 01/21/2022      MICRO   Recent Results (from the past 240 hour(s))  Respiratory (~20 pathogens) panel by PCR     Status: None   Collection Time: 01/21/22  5:28 PM  Result Value Ref Range Status   Adenovirus NOT DETECTED NOT DETECTED Final   Coronavirus 229E NOT DETECTED NOT DETECTED Final    Comment: (NOTE) The Coronavirus on the Respiratory Panel, DOES NOT test for the novel  Coronavirus (2019 nCoV)  Coronavirus HKU1 NOT DETECTED NOT DETECTED Final   Coronavirus NL63 NOT DETECTED NOT DETECTED Final   Coronavirus OC43 NOT DETECTED NOT DETECTED Final   Metapneumovirus NOT DETECTED NOT DETECTED Final   Rhinovirus / Enterovirus NOT DETECTED NOT DETECTED Final   Influenza A NOT DETECTED NOT DETECTED Final   Influenza B NOT DETECTED NOT DETECTED Final   Parainfluenza Virus 1 NOT DETECTED NOT DETECTED Final   Parainfluenza Virus 2 NOT DETECTED NOT DETECTED Final   Parainfluenza Virus 3 NOT DETECTED NOT DETECTED Final   Parainfluenza Virus 4 NOT DETECTED NOT DETECTED Final   Respiratory Syncytial Virus NOT DETECTED NOT DETECTED Final   Bordetella pertussis NOT DETECTED NOT DETECTED Final   Bordetella Parapertussis NOT DETECTED NOT  DETECTED Final   Chlamydophila pneumoniae NOT DETECTED NOT DETECTED Final   Mycoplasma pneumoniae NOT DETECTED NOT DETECTED Final    Comment: Performed at [facility], [place] [address] [place], [place] [address]  SARS Coronavirus 2 by RT PCR (hospital order, performed in [facility] hospital lab) *cepheid single result test* Anterior Nasal Swab     Status: None   Collection Time: 01/21/22  5:29 PM   Specimen: Anterior Nasal Swab  Result Value Ref Range Status   SARS Coronavirus 2 by RT PCR NEGATIVE NEGATIVE Final    Comment: (NOTE) SARS-CoV-2 target nucleic acids are NOT DETECTED.  The SARS-CoV-2 RNA is generally detectable in upper and lower respiratory specimens during the acute phase of infection. The lowest concentration of SARS-CoV-2 viral copies this assay can detect is 250 copies / mL. A negative result does not preclude SARS-CoV-2 infection and should not be used as the sole basis for treatment or other patient management decisions.  A negative result may occur with improper specimen collection / handling, submission of specimen other than nasopharyngeal swab, presence of viral mutation(s) within the areas targeted by this assay, and inadequate number of viral copies (<250 copies / mL). A negative result must be combined with clinical observations, patient history, and epidemiological information.  Fact Sheet for Patients:   https://www.patel.info/  Fact Sheet for Healthcare Providers: https://hall.com/  This test is not yet approved or  cleared by the [place] FDA and has been authorized for detection and/or diagnosis of SARS-CoV-2 by FDA under an Emergency Use Authorization (EUA).  This EUA will remain in effect (meaning this test can be used) for the duration of the COVID-19 declaration under Section 564(b)(1) of the Act, 21 U.S.C. section 360bbb-3(b)(1), unless the authorization is terminated or revoked  sooner.  Performed at [facility], [address] [place], [place] [address]          Impression:   1)Renal     Patient has CKD stage 2 Patient has CKD most likely from hypertension Patient has had proteinuria going back to February of this year     2)Uncontrolled hypertension/hypertensive urgency   Patient is currently on Coreg 6.25 mg p.o. twice daily Lasix 40 mg IV twice daily Losartan 50 mg p.o. daily There is a possibility of renal artery stenosis as patient has had recent worsening of her hypertension   Regarding work-up for her ischemic nephropathy with the patient need CT angio versus catheterization. In case the cardiology decides for patient to have cardiac catheterization then we will discuss the case with them to also look for renal artery stenosis if possible at the same time    Patient blood pressure is better controlled than yesterday    3)Anemia of  chronic disease/Macrocytic anemia HGb at goal (9--11) Hematology/oncology is following      Latest Ref Rng & Units 01/23/2022    5:20 AM 01/22/2022    4:17 AM 01/21/2022   12:01 AM  CBC  WBC 4.0 - 10.5 K/uL 3.8  3.4  5.1   Hemoglobin 12.0 - 15.0 g/dL 10.0  9.9  10.0   Hematocrit 36.0 - 46.0 % 30.9  30.2  30.3   Platelets 150 - 400 K/uL 145  144  173         4) Secondary hyperparathyroidism -CKD Mineral-Bone Disorder    Lab Results  Component Value Date   CALCIUM 9.1 01/22/2022    Phosphorus at goal.   5)Anasarca Most likely cardiac    Unlikely to be renal associated as patient protein/creatinine ratio came back at 1.4 g patient anasarca is unlikely to be secondary to proteinuria Patient 24-hour urine came back at 1 g     6) Electrolytes      Latest Ref Rng & Units 01/22/2022    1:48 PM 01/22/2022    4:17 AM 01/21/2022   12:01 AM  BMP  Glucose 70 - 99 mg/dL 112  97    BUN 8 - 23 mg/dL 18  21    Creatinine 0.44 - 1.00 mg/dL 0.89  1.03  0.97   Sodium 135 - 145  mmol/L 134  137    Potassium 3.5 - 5.1 mmol/L 4.1  2.6    Chloride 98 - 111 mmol/L 103  105    CO2 22 - 32 mmol/L 25  24    Calcium 8.9 - 10.3 mg/dL 9.1  8.9       Sodium Hyponatremia Most likely secondary to fluid overload We will follow   Potassium Hypokalemia Being replete and patient has also been added on spironolactone Patient potassium now better   7)Acid base   Co2 at goal  8)Thrombocytopenia  patient platelet counts are stable  9)Proteinuria Patient has nonnephrotic range proteinuria Patient is on losartan and spironolactone It is unlikely to be causing anasarca   Plan:   We will continue patient on current treatment       Karver Fadden s Theador Hawthorne 01/23/2022, 6:58 AM

## 2022-01-23 NOTE — Discharge Summary (Signed)
Physician Discharge Summary   Patient: Katie Hill MRN: [id number] DOB: 05/24/1950  Admit date:     01/20/2022  Discharge date: 01/23/22  Discharge Physician: Loletha Grayer   PCP: Cletis Athens, MD   Recommendations at discharge:   Follow-up PCP Follow-up CHF clinic Follow-up cardiology Follow-up oncology  Discharge Diagnoses: Principal Problem:   Acute on chronic diastolic CHF (congestive heart failure) (HCC) Active Problems:   Sinus bradycardia   Bilateral pleural effusion   Hypertensive urgency   Hypokalemia   Acute respiratory failure with hypoxia (HCC)   Hypothyroidism   Malignant neoplasm of ovary (HCC)   Thrombocytopenia, chemotherapy-induced (HCC)   Anemia due to antineoplastic chemotherapy   Anasarca   CKD (chronic kidney disease), stage III (HCC)   Malnutrition of moderate degree    Hospital Course: The patient was admitted to the hospital on 01/20/2022 and discharged on 01/23/2022.  Patient was admitted with acute diastolic congestive heart failure, bilateral effusions and acute hypoxic respiratory failure.  Patient was diuresed with IV Lasix 40 mg IV twice daily.  Patient's blood pressure was also very high and antihypertensive medications need to be added.  Recent echocardiogram showed an EF of 55%.  Patient was able to be titrated off oxygen.  On 01/22/2022 the patient had a transient bradycardia episode x2 likely vasovagal in nature.  We held the patient's Coreg.  Patient feeling a lot better and wanting to go home on 01/23/2022.  Cardiology okay to go back on low-dose beta-blocker at night.  Patient's blood pressure 159/91 upon discharge.  Hydralazine increased up to 25 mg 3 times daily.   Assessment and Plan: * Acute on chronic diastolic CHF (congestive heart failure) (HCC) Last EF normal range.  IV Lasix held on the day of discharge.  As per cardiology can go back on Lasix 40 mg daily.  Continue increased dose of losartan and Aldactone.  Cardiology  cleared to go back on low-dose beta-blocker at night.  I prescribed Toprol-XL 12.5 mg nightly.  Hydralazine increased to 25 mg 3 times daily.  Sinus bradycardia 2 transient bradycardia episodes on 01/22/2022.  We held the patient's Coreg.  Cardiology cleared to go back on low-dose beta-blocker on discharge.  Hypokalemia Needed aggressive replacement while in the hospital on IV Lasix.  Now that she is on losartan and Aldactone I do not think we need replacement upon going home.  Recommend checking BMP and follow-up appointment.  Please schedule lowest potassium normal range upon discharge at 4.1.  Hypertensive urgency At home blood pressure in the 190s.  Patient's blood pressure trended better to 159/91 upon discharge.  She will be on Lasix 40 mg daily, hydralazine 25 mg 3 times daily, Toprol-XL 12.5 mg at night, spironolactone 25 mg daily and losartan 100 mg daily.  Recommend checking BMP and follow-up appointment and titrating medications if need be.  Bilateral pleural effusion No rales heard on exam today.  Acute respiratory failure with hypoxia (HCC) Pulse ox in the mid 80s on presentation.  Able to come off oxygen during the hospital course.  Pulse ox 97% on room air upon discharge.  Hypothyroidism Continue levothyroxine.  TSH previously pretty elevated.  Will need to follow-up thyroid function as outpatient.  Malignant neoplasm of ovary [facility]) Oncology follow-up as outpatient.  Thrombocytopenia, chemotherapy-induced (HCC) Today's platelet count 145  Anemia due to antineoplastic chemotherapy Last hemoglobin 10.0  CKD (chronic kidney disease), stage III (HCC) CKD stage IIIa.  Creatinine 1.17 upon discharge.  Recommend checking BMP and follow-up appointment.  Lasix held on the day of discharge.  Anasarca With proteinuria. Nephrology consulted by admitting physician.  Renal ultrasound negative.  Continue losartan.  Treat congestive heart failure.         Consultants:  Cardiology, nephrology Procedures performed: None Disposition: Home Diet recommendation:  Cardiac diet DISCHARGE MEDICATION: Allergies as of 01/23/2022       Reactions   Omeprazole Rash        Medication List     STOP taking these medications    metoprolol tartrate 50 MG tablet Commonly known as: LOPRESSOR       TAKE these medications    aspirin 81 MG chewable tablet Chew 1 tablet (81 mg total) by mouth daily. Start taking on: January 24, 2022   feeding supplement Liqd Take 237 mLs by mouth 3 (three) times daily between meals.   furosemide 40 MG tablet Commonly known as: Lasix Take one tablet daily (can take extra tab in afternoon if gain 3 lbs in a day or 5 lbs in a week) What changed:  how much to take how to take this when to take this additional instructions   hydrALAZINE 25 MG tablet Commonly known as: APRESOLINE Take 1 tablet (25 mg total) by mouth 3 (three) times daily.   levothyroxine 100 MCG tablet Commonly known as: SYNTHROID Take 1 tablet (100 mcg total) by mouth daily.   lidocaine-prilocaine cream Commonly known as: EMLA Apply 1 application topically as needed. Apply small amount to port site at least 1 hour prior to it being accessed, cover with plastic wrap   losartan 100 MG tablet Commonly known as: COZAAR Take 1 tablet (100 mg total) by mouth daily. Start taking on: January 24, 2022 What changed:  medication strength how much to take Another medication with the same name was removed. Continue taking this medication, and follow the directions you see here.   metoprolol succinate 25 MG 24 hr tablet Commonly known as: Toprol XL Take 0.5 tablets (12.5 mg total) by mouth at bedtime.   multivitamin with minerals Tabs tablet Take 1 tablet by mouth daily.   ondansetron 8 MG tablet Commonly known as: ZOFRAN Take 1 tablet (8 mg total) by mouth every 8 (eight) hours as needed for nausea or vomiting.   spironolactone 25 MG  tablet Commonly known as: ALDACTONE Take 1 tablet (25 mg total) by mouth daily. Start taking on: January 24, 2022        Follow-up Information     Cletis Athens, MD Follow up in 5 day(s).   Specialties: Internal Medicine, Cardiology Contact information: [place] [address] [phone]         Wellington Hampshire, MD Follow up in 1 week(s).   Specialty: Cardiology Contact information: [place] [address] [phone]                Discharge Exam: Danley Danker Weights   01/20/22 1849 01/23/22 0429  Weight: 52.6 kg 49.9 kg   Physical Exam HENT:     Head: Normocephalic.     Mouth/Throat:     Pharynx: No oropharyngeal exudate.  Eyes:     General: Lids are normal.     Conjunctiva/sclera: Conjunctivae normal.  Cardiovascular:     Rate and Rhythm: Normal rate and regular rhythm.     Heart sounds: Normal heart sounds, S1 normal and S2 normal.  Pulmonary:     Breath sounds: No decreased breath sounds, wheezing, rhonchi or rales.  Abdominal:  Palpations: Abdomen is soft.     Tenderness: There is no abdominal tenderness.  Musculoskeletal:     Right lower leg: Swelling present.     Left lower leg: Swelling present.     Comments: Swelling has improved from admission  Skin:    General: Skin is warm.     Findings: No rash.  Neurological:     Mental Status: She is alert and oriented to person, place, and time.      Condition at discharge: stable  The results of significant diagnostics from this hospitalization (including imaging, microbiology, ancillary and laboratory) are listed below for reference.   Imaging Studies: US RENAL  Result Date: 01/21/2022 CLINICAL DATA:  Edema. EXAM: RENAL / URINARY TRACT ULTRASOUND COMPLETE COMPARISON:  None Available. FINDINGS: Right Kidney: Renal measurements: 11.9 x 4.7 x 5.4 cm = volume: 157.2 mL. Echogenicity within normal limits. No mass or hydronephrosis visualized. Left  Kidney: Renal measurements: 11.4 x 4.4 x 5.4 cm = volume: 144.2 mL. Echogenicity within normal limits. No mass or hydronephrosis visualized. Bladder: Appears normal for degree of bladder distention. Other: Bilateral pleural effusions. IMPRESSION: Normal sonographic appearance of the kidneys.  Normal bladder. Electronically Signed   By: Lajean Manes M.D.   On: 01/21/2022 13:06   CT Head Wo Contrast  Result Date: 01/20/2022 CLINICAL DATA:  Headache, sudden, severe EXAM: CT HEAD WITHOUT CONTRAST TECHNIQUE: Contiguous axial images were obtained from the base of the skull through the vertex without intravenous contrast. RADIATION DOSE REDUCTION: This exam was performed according to the departmental dose-optimization program which includes automated exposure control, adjustment of the mA and/or kV according to patient size and/or use of iterative reconstruction technique. COMPARISON:  CT head January 13, 2022. FINDINGS: Brain: No evidence of acute infarction, hemorrhage, hydrocephalus, extra-axial collection or mass lesion/mass effect. Vascular: No hyperdense vessel. Skull: No acute fracture. Sinuses/Orbits: Clear sinuses.  No acute orbital findings. Other: No mastoid effusions IMPRESSION: No evidence of acute intracranial abnormality. Electronically Signed   By: Margaretha Sheffield M.D.   On: 01/20/2022 16:37   DG Chest 2 View  Result Date: 01/20/2022 CLINICAL DATA:  Hypertension EXAM: CHEST - 2 VIEW COMPARISON:  CT 12/26/2021 FINDINGS: Right-sided central venous port tip over the cavoatrial region. Small bilateral pleural effusions with airspace disease at both bases. Partially obscured cardiomediastinal silhouette. No pneumothorax. IMPRESSION: Small bilateral pleural effusions with basilar atelectasis or pneumonia Electronically Signed   By: Donavan Foil M.D.   On: 01/20/2022 16:35   CT HEAD WO CONTRAST (5MM)  Result Date: 01/13/2022 CLINICAL DATA:  Headaches EXAM: CT HEAD WITHOUT CONTRAST TECHNIQUE:  Contiguous axial images were obtained from the base of the skull through the vertex without intravenous contrast. RADIATION DOSE REDUCTION: This exam was performed according to the departmental dose-optimization program which includes automated exposure control, adjustment of the mA and/or kV according to patient size and/or use of iterative reconstruction technique. COMPARISON:  None Available. FINDINGS: Brain: No evidence of acute infarction, hemorrhage, hydrocephalus, extra-axial collection or mass lesion/mass effect. Vascular: No hyperdense vessel or unexpected calcification. Skull: No osseous abnormality. Sinuses/Orbits: Visualized paranasal sinuses are clear. Visualized mastoid sinuses are clear. Visualized orbits demonstrate no focal abnormality. Other: None IMPRESSION: No acute intracranial findings. Electronically Signed   By: Kathreen Devoid M.D.   On: 01/13/2022 10:36   CT CHEST ABDOMEN PELVIS W CONTRAST  Result Date: 12/27/2021 CLINICAL DATA:  Ovarian cancer. Leg swelling. Hysterectomy 2018. Chemotherapy ongoing. * Tracking Code: BO * EXAM: CT CHEST, ABDOMEN, AND PELVIS  WITH CONTRAST TECHNIQUE: Multidetector CT imaging of the chest, abdomen and pelvis was performed following the standard protocol during bolus administration of intravenous contrast. RADIATION DOSE REDUCTION: This exam was performed according to the departmental dose-optimization program which includes automated exposure control, adjustment of the mA and/or kV according to patient size and/or use of iterative reconstruction technique. CONTRAST:  161m OMNIPAQUE IOHEXOL 300 MG/ML  SOLN COMPARISON:  CT 09/19/2021 CT CHEST FINDINGS Cardiovascular: Port in the anterior chest wall with tip in distal SVC. No significant vascular findings. Normal heart size. No pericardial effusion. Mediastinum/Nodes: No axillary or supraclavicular adenopathy. No mediastinal or hilar adenopathy. No pericardial fluid. Esophagus normal. Lungs/Pleura: New moderate  size layering RIGHT pleural effusion. LEFT lobe pulmonary nodule measures 6 mm (image 99/4) is unchanged. New nodule along the RIGHT oblique fissure measures 4 mm (image 61/4). Subtle pleural thickening more medially along the RIGHT oblique fissure is more prominent on image 75/4. Musculoskeletal: No aggressive osseous lesion. CT ABDOMEN AND PELVIS FINDINGS Hepatobiliary: No focal hepatic lesion. No biliary ductal dilatation. Gallbladder is normal. Common bile duct is normal. Pancreas: Pancreas is normal. No ductal dilatation. No pancreatic inflammation. Spleen: Normal spleen Adrenals/urinary tract: Adrenal glands and kidneys are normal. The ureters and bladder normal. Stomach/Bowel: Stomach, small bowel, appendix, and cecum are normal. The colon and rectosigmoid colon are normal. Vascular/Lymphatic: Abdominal aorta is normal caliber. Small periaortic lymph nodes again noted. Largest focus lymphoid tissue measures 10 mm on image 69/2 not changed from prior. No new lymphadenopathy abdomen pelvis. Reproductive: Post hysterectomy anatomy. Other: soft tissue lesion in the posterior RIGHT pelvis anterior to the sacrum measures 14 x 29 mm (image 109/2) compared to 14 mm x 30 mm. Visually lesion looks similar. No new peritoneal implants Musculoskeletal: No aggressive osseous lesion. IMPRESSION: Chest Impression: 1. New RIGHT pleural effusion and new mild nodularity along the RIGHT oblique fissure. Recommend close attention on follow-up for potential pleural metastasis in the RIGHT hemithorax. 2. Stable LEFT lobe pulmonary nodule. 3. No mediastinal lymphadenopathy. Abdomen / Pelvis Impression: 1. Stable soft tissue lesion in the deep RIGHT pelvis. 2. Mass stable small periaortic lymph nodes. 3. No intraperitoneal free fluid the abdomen or pelvis. Electronically Signed   By: SSuzy BouchardM.D.   On: 12/27/2021 10:11    Microbiology: Results for orders placed or performed during the hospital encounter of 01/20/22   Respiratory (~20 pathogens) panel by PCR     Status: None   Collection Time: 01/21/22  5:28 PM  Result Value Ref Range Status   Adenovirus NOT DETECTED NOT DETECTED Final   Coronavirus 229E NOT DETECTED NOT DETECTED Final    Comment: (NOTE) The Coronavirus on the Respiratory Panel, DOES NOT test for the novel  Coronavirus (2019 nCoV)    Coronavirus HKU1 NOT DETECTED NOT DETECTED Final   Coronavirus NL63 NOT DETECTED NOT DETECTED Final   Coronavirus OC43 NOT DETECTED NOT DETECTED Final   Metapneumovirus NOT DETECTED NOT DETECTED Final   Rhinovirus / Enterovirus NOT DETECTED NOT DETECTED Final   Influenza A NOT DETECTED NOT DETECTED Final   Influenza B NOT DETECTED NOT DETECTED Final   Parainfluenza Virus 1 NOT DETECTED NOT DETECTED Final   Parainfluenza Virus 2 NOT DETECTED NOT DETECTED Final   Parainfluenza Virus 3 NOT DETECTED NOT DETECTED Final   Parainfluenza Virus 4 NOT DETECTED NOT DETECTED Final   Respiratory Syncytial Virus NOT DETECTED NOT DETECTED Final   Bordetella pertussis NOT DETECTED NOT DETECTED Final   Bordetella Parapertussis NOT DETECTED NOT  DETECTED Final   Chlamydophila pneumoniae NOT DETECTED NOT DETECTED Final   Mycoplasma pneumoniae NOT DETECTED NOT DETECTED Final    Comment: Performed at [facility], [place] [address] [place], [place] [address]  SARS Coronavirus 2 by RT PCR (hospital order, performed in [facility] hospital lab) *cepheid single result test* Anterior Nasal Swab     Status: None   Collection Time: 01/21/22  5:29 PM   Specimen: Anterior Nasal Swab  Result Value Ref Range Status   SARS Coronavirus 2 by RT PCR NEGATIVE NEGATIVE Final    Comment: (NOTE) SARS-CoV-2 target nucleic acids are NOT DETECTED.  The SARS-CoV-2 RNA is generally detectable in upper and lower respiratory specimens during the acute phase of infection. The lowest concentration of SARS-CoV-2 viral copies this assay can detect is 250 copies / mL. A negative result does  not preclude SARS-CoV-2 infection and should not be used as the sole basis for treatment or other patient management decisions.  A negative result may occur with improper specimen collection / handling, submission of specimen other than nasopharyngeal swab, presence of viral mutation(s) within the areas targeted by this assay, and inadequate number of viral copies (<250 copies / mL). A negative result must be combined with clinical observations, patient history, and epidemiological information.  Fact Sheet for Patients:   https://www.patel.info/  Fact Sheet for Healthcare Providers: https://hall.com/  This test is not yet approved or  cleared by the [place] FDA and has been authorized for detection and/or diagnosis of SARS-CoV-2 by FDA under an Emergency Use Authorization (EUA).  This EUA will remain in effect (meaning this test can be used) for the duration of the COVID-19 declaration under Section 564(b)(1) of the Act, 21 U.S.C. section 360bbb-3(b)(1), unless the authorization is terminated or revoked sooner.  Performed at [facility], [address] [place], [place] [address]     Labs: CBC: Recent Labs  Lab 01/20/22 1859 01/21/22 0001 01/22/22 0417 01/23/22 0520  WBC 4.7 5.1 3.4* 3.8*  NEUTROABS 3.3  --   --   --   HGB 10.2* 10.0* 9.9* 10.0*  HCT 32.0* 30.3* 30.2* 30.9*  MCV 107.4* 106.3* 104.9* 105.5*  PLT 138* 173 144* 782*   Basic Metabolic Panel: Recent Labs  Lab 01/20/22 1859 01/21/22 0001 01/22/22 0417 01/22/22 1348 01/23/22 0520  NA 137  --  137 134* 138  K 3.7  --  2.6* 4.1 4.1  CL 105  --  105 103 107  CO2 23  --  '24 25 25  '$ GLUCOSE 115*  --  97 112* 88  BUN 26*  --  '21 18 22  '$ CREATININE 1.07* 0.97 1.03* 0.89 1.17*  CALCIUM 9.9  --  8.9 9.1 9.3  MG  --   --  2.0 1.8  --     CBG: Recent Labs  Lab 01/21/22 0721  GLUCAP 97    Discharge time spent: greater than 30  minutes.  Signed: Loletha Grayer, MD [facility] 01/23/2022

## 2022-01-23 NOTE — Consult Note (Signed)
   Heart Failure Nurse Navigator Note  HfpEF  55%.  Mild mitral regurgitation.  Mild to moderate tricuspid regurgitation.  She presented to the emergency room with complaints of dry cough, worsening lower extremity edema, shortness of breath, fatigue and orthopnea.  BNP was greater than 4500.  This x-ray with small bilateral pleural effusions.   Comorbidities:  Hypertension Hypothyroidism Ovarian cancer on chemotherapy  Medications:  Aspirin 81 mg daily Hydralazine 25 mg 3 times a day Levothyroxine 100 mcg daily Losartan 100 mg daily Spironolactone 25 mg daily  Labs:  Sodium 138, potassium 4.1, chloride 107, CO2 25, BUN 22, creatinine 1.17, GFR 51 Weight 49.9 kg Blood pressure 168/99 Intake 456 mL Output 925 mL  Initial meeting with patient and her husband who was at the bedside.  Discussed heart failure and what it means, also stressed the importance of keeping her blood pressure in check.  She states that she does have a cuff at home, recommended checking daily and recording.  She states that they mostly eat at home with rarely eating at a restaurant.  She does cook with salt and uses some salt.  Explained the reasoning behind low-sodium diet and not using salt.  Explained that it would also help with her blood pressures.  Gave examples of what she could use to seasoning her foods.  Discussed the importance of weighing daily and reporting a 2 pound weight gain overnight or 5 pounds within the week.  Also discussed changes in symptoms like what she exhibited before coming to the hospital.  She voices understanding.  Husband was also very involved in this session, asking good questions.  Discussed follow-up in the outpatient heart failure clinic for which she has an appointment on October 23 at 9 AM.  She is a 1% no-show which is 2 out of 272 appointments.  Also made aware that she would qualify for the [facility] ventricle health program but patient states that she does not text,  email or have the capabilities on her phone to do video chats.  We will continue to follow.  Pricilla Riffle RN [facility]

## 2022-01-23 NOTE — Progress Notes (Signed)
Rounding Note    Patient Name: Katie Hill Date of Encounter: 01/23/2022  [place] Cardiologist: None   Subjective   Patient seen on a.m. rounds.  Husband at bedside.  Denies any chest pain or worsening shortness of breath.  Also denies any intolerance to diet.  States breathing has improved.  Blood pressure continues to remain elevated.  According to patient she was informed by nephrology that blood pressure was not related to kidney issue.  Inpatient Medications    Scheduled Meds:  aspirin  81 mg Oral Daily   Chlorhexidine Gluconate Cloth  6 each Topical Q0600   enoxaparin (LOVENOX) injection  40 mg Subcutaneous Q24H   feeding supplement  237 mL Oral TID BM   heparin lock flush  500 Units Intravenous Once   hydrALAZINE  25 mg Oral TID   levothyroxine  100 mcg Oral Q0600   losartan  100 mg Oral Daily   multivitamin with minerals  1 tablet Oral Daily   spironolactone  25 mg Oral Daily   Continuous Infusions:  PRN Meds: acetaminophen **OR** acetaminophen, hydrALAZINE, HYDROcodone-acetaminophen, ondansetron **OR** ondansetron (ZOFRAN) IV   Vital Signs    Vitals:   01/23/22 0801 01/23/22 0942 01/23/22 1207 01/23/22 1459  BP: (!) 182/96 (!) 155/84 (!) 168/99 (!) 159/91  Pulse: 91 73 87 88  Resp: '18  18 18  '$ Temp: 98.2 F (36.8 C)  98.1 F (36.7 C) 97.8 F (36.6 C)  TempSrc:      SpO2: 94%  97% 97%  Weight:      Height:        Intake/Output Summary (Last 24 hours) at 01/23/2022 1543 Last data filed at 01/23/2022 1425 Gross per 24 hour  Intake 296.03 ml  Output 0 ml  Net 296.03 ml      01/23/2022    4:29 AM 01/20/2022    6:49 PM 01/13/2022    9:02 AM  Last 3 Weights  Weight (lbs) 110 lb 116 lb 118 lb  Weight (kg) 49.896 kg 52.617 kg 53.524 kg      Telemetry    Sinus rhythm rates 80-90- Personally Reviewed  ECG    No new tracings- Personally Reviewed  Physical Exam   GEN: No acute distress.   Neck: No JVD Cardiac: RRR, no  murmurs, rubs, or gallops.  Respiratory: Clear upper lobes with rales in bilateral bases to auscultation bilaterally.  Respirations remain unlabored at rest currently on room air GI: Soft, nontender, non-distended  MS: 1+ edema BLE; No deformity. Neuro:  Nonfocal  Psych: Normal affect   Labs    High Sensitivity Troponin:   Recent Labs  Lab 01/20/22 1859 01/21/22 0001  TROPONINIHS 50* 57*     Chemistry Recent Labs  Lab 01/22/22 0417 01/22/22 1348 01/23/22 0520  NA 137 134* 138  K 2.6* 4.1 4.1  CL 105 103 107  CO2 '24 25 25  '$ GLUCOSE 97 112* 88  BUN '21 18 22  '$ CREATININE 1.03* 0.89 1.17*  CALCIUM 8.9 9.1 9.3  MG 2.0 1.8  --   GFRNONAA 59* >60 51*  ANIONGAP '8 6 6    '$ Lipids No results for input(s): "CHOL", "TRIG", "HDL", "LABVLDL", "LDLCALC", "CHOLHDL" in the last 168 hours.  Hematology Recent Labs  Lab 01/21/22 0001 01/22/22 0417 01/23/22 0520  WBC 5.1 3.4* 3.8*  RBC 2.85* 2.88* 2.93*  HGB 10.0* 9.9* 10.0*  HCT 30.3* 30.2* 30.9*  MCV 106.3* 104.9* 105.5*  MCH 35.1* 34.4* 34.1*  MCHC 33.0  32.8 32.4  RDW 14.2 14.0 14.1  PLT 173 144* 145*   Thyroid No results for input(s): "TSH", "FREET4" in the last 168 hours.  BNP Recent Labs  Lab 01/20/22 1859  BNP >4,500.0*    DDimer No results for input(s): "DDIMER" in the last 168 hours.   Radiology    No results found.  Cardiac Studies   Echo 12/23/21 1. Left ventricular ejection fraction, by estimation, is 55%. The left  ventricle has normal function. The left ventricle has no regional wall  motion abnormalities. The average left ventricular global longitudinal  strain is -13.5 %.   2. Right ventricular systolic function is normal. The right ventricular  size is normal.   3. The mitral valve is normal in structure. Mild mitral valve  regurgitation. No evidence of mitral stenosis.   4. Tricuspid valve regurgitation is mild to moderate.   5. The aortic valve is tricuspid. Aortic valve regurgitation is trivial.   No aortic stenosis is present.   6. The inferior vena cava is normal in size with greater than 50%  respiratory variability, suggesting right atrial pressure of 3 mmHg.   Patient Profile     71 y.o. female with past medical history of ovarian cancer, hypertension, hypothyroidism admitted for volume overload.  Cardiology was asked to see for acute diastolic heart failure and elevated high-sensitivity troponin.  Assessment & Plan    Acute diastolic heart failure -BNP on arrival greater than 4500 -Continue on Aldactone 25 mg daily -LVEF 55% -Continue on losartan -Beta-blocker therapy was placed on hold due to episodic bradycardia with the patient was having nausea and overall discomfort possible they were vagal in nature and were extremely brief but therapy placed on hold to avoid confusion with determining etiology -Once renal work-up is complete consider adding SGLT2i -Daily weight, INO, low-sodium diet  Elevated high-sensitivity troponin -High-sensitivity troponin mildly elevated 15 and 57 -Previously had 1 episode of chest pressure when walking from her mailbox -Recent CTA of the chest, abdomen and pelvis did not show any significant cardiovascular findings/calcium -Continue beta-blocker therapy and low-dose aspirin -Has remained chest pain-free during hospitalization can defer further ischemic evaluation to outpatient setting  Hypertension -Blood pressure 155/84 -Continued on losartan 100 mg daily -Hydralazine increased to 25 mg 3 times daily -Continued on spironolactone 25 mg daily -Continued on as needed hydralazine -Normal renal ultrasound -No plans for ischemic work-up would recommend CT angio to rule out renal artery stenosis with recent worsening of hypertension -Vital signs per unit protocol      For questions or updates, please contact [place] Please consult [email or web address] for contact info under        Signed, [place] Grayer, NP  01/23/2022, 3:43  PM

## 2022-01-23 NOTE — TOC CM/SW Note (Signed)
  Transition of Care [facility]) Screening Note   Patient Details  Name: Katie Hill Date of Birth: April 16, 1950   Transition of Care [facility]) CM/SW Contact:    Candie Chroman, LCSW Phone Number: 01/23/2022, 1:59 PM    Transition of Care Department [facility]) has reviewed patient and no TOC needs have been identified at this time. We will continue to monitor patient advancement through interdisciplinary progression rounds. If new patient transition needs arise, please place a TOC consult.

## 2022-01-23 NOTE — Care Management Important Message (Addendum)
Important Message  Patient Details  Name: Katie Hill MRN: [id number] Date of Birth: 1951-01-24   Medicare Important Message Given:  Yes  Initial Medicare IM reviewed with patient via Patient Access staff.     Dannette Barbara 01/23/2022, 1:12 PM

## 2022-01-23 NOTE — Progress Notes (Signed)
Initial Nutrition Assessment  DOCUMENTATION CODES:   Underweight, Non-severe (moderate) malnutrition in context of chronic illness  INTERVENTION:   -Ensure Enlive po TID, each supplement provides 350 kcal and 20 grams of protein -MVI with minerals daily -Liberalize diet to 2 gram sodium  NUTRITION DIAGNOSIS:   Moderate Malnutrition related to chronic illness (ovarian cancer, CHF) as evidenced by mild fat depletion, moderate fat depletion, mild muscle depletion, moderate muscle depletion.  GOAL:   Patient will meet greater than or equal to 90% of their needs  MONITOR:   PO intake, Supplement acceptance  REASON FOR ASSESSMENT:   Consult Assessment of nutrition requirement/status  ASSESSMENT:      Katie Hill admitted with acute CHF, bilateral pleural effusions, and acute respiratory failure with hypoxia.   Reviewed I/O's: -469 ml x 24 hours and -3.1 L since admission  UOP: 925 ml x 24 hours   Spoke with Katie Hill and husband at bedside. Katie Hill smiling and reports feeling better today. Per Katie Hill, she has never been a big eater and has always been small framed. Per Katie Hill, she typically consumes 2 meals per day PTA (Breakfast: eggs and toast; Dinner: beans and vegetables). Katie Hill does most of her cooking at home, but will eat out about once a week at [employer].   Katie Hill shares she consumed about 50% of her breakfast. She has consumed Ensure supplements in the past and is willing to try again.    Katie Hill reports UBW is around 110# and suspects some wt loss is related to diuresis. Katie Hill shares that she is typically very active, however, felt winded when walking to the mailbox, which is uncommon for her. Katie Hill shares she has not had chemo for about 2 weeks secondary to blood pressure issues, however, was tolerating well when going to treatments. Reviewed wt hx; Katie Hill has experienced a 5.8% wt loss over the past month, which is significant for time frame. Katie Hill still with mild edema, which may be masking further weight loss as well  as fat and muscle depletions.   Discussed importance of good meal and supplement intake to promote healing.   Medications reviewed and include aldactone.   Labs reviewed.   NUTRITION - FOCUSED PHYSICAL EXAM:  Flowsheet Row Most Recent Value  Orbital Region Mild depletion  Upper Arm Region Moderate depletion  Thoracic and Lumbar Region No depletion  Buccal Region Mild depletion  Temple Region Mild depletion  Clavicle Bone Region Moderate depletion  Clavicle and Acromion Bone Region Mild depletion  Scapular Bone Region Mild depletion  Dorsal Hand Mild depletion  Patellar Region Mild depletion  Anterior Thigh Region Mild depletion  Posterior Calf Region Mild depletion  Edema (RD Assessment) Mild  Hair Reviewed  Eyes Reviewed  Mouth Reviewed  Skin Reviewed  Nails Reviewed       Diet Order:   Diet Order             Diet 2 gram sodium Room service appropriate? Yes; Fluid consistency: Thin  Diet effective now                   EDUCATION NEEDS:   Education needs have been addressed  Skin:  Skin Assessment: Reviewed RN Assessment  Last BM:  Unknown  Height:   Ht Readings from Last 1 Encounters:  01/20/22 '5\' 8"'$  (1.727 m)    Weight:   Wt Readings from Last 1 Encounters:  01/23/22 49.9 kg    Ideal Body Weight:  63.6 kg  BMI:  Body mass  index is 16.73 kg/m.  Estimated Nutritional Needs:   Kcal:  1550-1750  Protein:  85-100 grams  Fluid:  > 1.5 L    Loistine Chance, RD, LDN, [place] Registered Dietitian II Certified Diabetes Care and Education Specialist Please refer to [facility] for RD and/or RD on-call/weekend/after hours pager

## 2022-01-23 NOTE — Progress Notes (Signed)
D/c port and piv.  D/c telemetry.  Patient to be escorted out of hospital via wheelchair by volunteers.

## 2022-01-24 ENCOUNTER — Telehealth: Payer: Self-pay | Admitting: *Deleted

## 2022-01-24 NOTE — Patient Outreach (Signed)
  Care Coordination TOC Note Transition Care Management Follow-up Telephone Call Date of discharge and from where: [facility] [phone] How have you been since you were released from the hospital? The swelling has went down a lot in my legs Any questions or concerns? Yes RN went over the symptoms of CHF The importance of monitoring weight and notifying Dr of the increases RN discussed low sodium diet with foods to avoid and reading the labels for NA amounts RN explained the medications that the patient is taking for diuretics  Items Reviewed: Did the pt receive and understand the discharge instructions provided? Yes  Medications obtained and verified? Yes  Other? Yes Explained to patient about diuretics Any new allergies since your discharge? No  Dietary orders reviewed? Yes Low sodium heart healthy diet Do you have support at home? Yes   Home Care and Equipment/Supplies: Were home health services ordered? no If so, what is the name of the agency? N/a  Has the agency set up a time to come to the patient's home? no Were any new equipment or medical supplies ordered?  No What is the name of the medical supply agency? N/a Were you able to get the supplies/equipment? no Do you have any questions related to the use of the equipment or supplies? No  Functional Questionnaire: (I = Independent and D = Dependent) ADLs: I  Bathing/Dressing- I  Meal Prep- I  Eating- I  Maintaining continence- I  Transferring/Ambulation- I  Managing Meds- I  Follow up appointments reviewed:  PCP Hospital f/u appt confirmed? No  Patient will schedule appointment [facility] f/u appt confirmed? Yes  Scheduled to see Darylene Price [phone] 9:00. Are transportation arrangements needed? No  If their condition worsens, is the pt aware to call PCP or go to the Emergency Dept.? Yes Was the patient provided with contact information for the PCP's office or ED? Yes Was to pt encouraged to call back with  questions or concerns? Yes  SDOH assessments and interventions completed:   Yes  Care Coordination Interventions Activated:  Yes   Care Coordination Interventions:  No Care Coordination interventions needed at this time.  RN offered care coordination on help with CHF and patient declined at this time  Encounter Outcome:  Pt. Visit Completed    [place] Management [phone]

## 2022-01-25 LAB — UPEP/TP, 24-HR URINE
Albumin, U: 75.5 %
Alpha 1, Urine: 5.6 %
Alpha 2, Urine: 3.9 %
Beta, Urine: 8.3 %
Gamma Globulin, Urine: 6.9 %
Total Protein, Urine-Ur/day: 1220 mg/24 hr — ABNORMAL HIGH (ref 30–150)
Total Protein, Urine: 42.8 mg/dL
Total Volume: 2850

## 2022-01-26 LAB — MULTIPLE MYELOMA PANEL, SERUM
Albumin SerPl Elph-Mcnc: 3.3 g/dL (ref 2.9–4.4)
Albumin/Glob SerPl: 1.3 (ref 0.7–1.7)
Alpha 1: 0.3 g/dL (ref 0.0–0.4)
Alpha2 Glob SerPl Elph-Mcnc: 0.5 g/dL (ref 0.4–1.0)
B-Globulin SerPl Elph-Mcnc: 0.8 g/dL (ref 0.7–1.3)
Gamma Glob SerPl Elph-Mcnc: 1.1 g/dL (ref 0.4–1.8)
Globulin, Total: 2.6 g/dL (ref 2.2–3.9)
IgA: 123 mg/dL (ref 87–352)
IgG (Immunoglobin G), Serum: 1110 mg/dL (ref 586–1602)
IgM (Immunoglobulin M), Srm: 74 mg/dL (ref 26–217)
Total Protein ELP: 5.9 g/dL — ABNORMAL LOW (ref 6.0–8.5)

## 2022-01-27 ENCOUNTER — Ambulatory Visit: Payer: Medicare Other

## 2022-01-27 ENCOUNTER — Other Ambulatory Visit: Payer: Medicare Other

## 2022-01-27 ENCOUNTER — Ambulatory Visit: Payer: Medicare Other | Admitting: Oncology

## 2022-01-27 LAB — CA 125

## 2022-01-28 NOTE — Progress Notes (Unsigned)
   Patient ID: Katie Hill, female    DOB: 1951/02/16, 71 y.o.   MRN: [id number]  HPI  Katie Hill is a 71 y/o female with a history of  Echo report from 12/23/21 reviewed and showed an EF of 55% along with mild MR and mild/moderate  Admitted 01/20/22 due to acute diastolic congestive heart failure, bilateral effusions and acute hypoxic respiratory failure. Treated with IV lasix with transition to oral diuretics. Cardiology and nephrology consults obtained. Antihypertensive meds added. Weaned off of oxygen. Carvedilol held for transient bradycardia but then resumed by cardiology. Hypokalemia corrected. Renal ultrasound negative. Discharged after 3 days.   She presents today for her initial visit with a chief complaint of   Review of Systems    Physical Exam   Assessment & Plan:  1: Chronic heart failure with preserved ejection fraction without structural changes- - NYHA class - to see cardiology (Agbor-Etang) 02/27/22 - BNP 01/20/22 was >4500.0 - repeat this today  2: HTN with CKD- - BP - saw PCP (Masoud) 07/05/21 - BMP 01/23/22 reviewed and showed sodium 138, potassium 4.1, creatinine 1.17 & GFR 51 - repeat BMP today due to hypokalemia in recent admission  3: Ovarian cancer- - saw oncology Tasia Catchings) 01/13/22; returns 02/10/22  4: Anemia- - Hg 01/23/22 was 10.0

## 2022-01-30 ENCOUNTER — Ambulatory Visit: Payer: Medicare Other | Attending: Family | Admitting: Family

## 2022-01-30 ENCOUNTER — Encounter: Payer: Self-pay | Admitting: Family

## 2022-01-30 VITALS — BP 168/88 | HR 68 | Resp 16 | Ht 67.0 in | Wt 112.5 lb

## 2022-01-30 DIAGNOSIS — N189 Chronic kidney disease, unspecified: Secondary | ICD-10-CM | POA: Insufficient documentation

## 2022-01-30 DIAGNOSIS — I10 Essential (primary) hypertension: Secondary | ICD-10-CM

## 2022-01-30 DIAGNOSIS — R519 Headache, unspecified: Secondary | ICD-10-CM | POA: Insufficient documentation

## 2022-01-30 DIAGNOSIS — R002 Palpitations: Secondary | ICD-10-CM | POA: Insufficient documentation

## 2022-01-30 DIAGNOSIS — R0602 Shortness of breath: Secondary | ICD-10-CM | POA: Insufficient documentation

## 2022-01-30 DIAGNOSIS — Z8543 Personal history of malignant neoplasm of ovary: Secondary | ICD-10-CM | POA: Insufficient documentation

## 2022-01-30 DIAGNOSIS — I5032 Chronic diastolic (congestive) heart failure: Secondary | ICD-10-CM | POA: Insufficient documentation

## 2022-01-30 DIAGNOSIS — D6481 Anemia due to antineoplastic chemotherapy: Secondary | ICD-10-CM

## 2022-01-30 DIAGNOSIS — C569 Malignant neoplasm of unspecified ovary: Secondary | ICD-10-CM | POA: Diagnosis not present

## 2022-01-30 DIAGNOSIS — R059 Cough, unspecified: Secondary | ICD-10-CM | POA: Insufficient documentation

## 2022-01-30 DIAGNOSIS — I130 Hypertensive heart and chronic kidney disease with heart failure and stage 1 through stage 4 chronic kidney disease, or unspecified chronic kidney disease: Secondary | ICD-10-CM | POA: Diagnosis not present

## 2022-01-30 DIAGNOSIS — R5383 Other fatigue: Secondary | ICD-10-CM | POA: Diagnosis not present

## 2022-01-30 DIAGNOSIS — I6523 Occlusion and stenosis of bilateral carotid arteries: Secondary | ICD-10-CM | POA: Diagnosis not present

## 2022-01-30 DIAGNOSIS — D631 Anemia in chronic kidney disease: Secondary | ICD-10-CM | POA: Insufficient documentation

## 2022-01-30 MED ORDER — METOPROLOL SUCCINATE ER 25 MG PO TB24: 25.0000 mg | ORAL_TABLET | Freq: Every day | ORAL | 3 refills | Status: DC

## 2022-01-30 MED ORDER — LOSARTAN POTASSIUM 100 MG PO TABS: 100.0000 mg | ORAL_TABLET | Freq: Every day | ORAL | 3 refills | Status: DC

## 2022-01-30 MED ORDER — HYDRALAZINE HCL 25 MG PO TABS: 25.0000 mg | ORAL_TABLET | Freq: Three times a day (TID) | ORAL | 3 refills | Status: DC

## 2022-01-30 MED ORDER — SPIRONOLACTONE 25 MG PO TABS: 25.0000 mg | ORAL_TABLET | Freq: Every day | ORAL | 3 refills | Status: DC

## 2022-01-30 MED ORDER — FUROSEMIDE 40 MG PO TABS: ORAL_TABLET | ORAL | 3 refills | Status: DC

## 2022-01-30 NOTE — Patient Instructions (Addendum)
Continue weighing daily and call for an overnight weight gain of 3 pounds or more or a weekly weight gain of more than 5 pounds.   If you have voicemail, please make sure your mailbox is cleaned out so that we may leave a message and please make sure to listen to any voicemails.    Use Mrs Katie Hill instead of salt.    Measure your glass and see how many ounces the glass holds. Try to keep your daily fluid intake to 60-64 ounces daily.    Use ACE wraps for the swelling in your legs

## 2022-01-31 ENCOUNTER — Telehealth: Payer: Self-pay

## 2022-01-31 DIAGNOSIS — Z09 Encounter for follow-up examination after completed treatment for conditions other than malignant neoplasm: Secondary | ICD-10-CM | POA: Diagnosis not present

## 2022-01-31 DIAGNOSIS — I160 Hypertensive urgency: Secondary | ICD-10-CM

## 2022-01-31 DIAGNOSIS — C569 Malignant neoplasm of unspecified ovary: Secondary | ICD-10-CM

## 2022-01-31 DIAGNOSIS — I110 Hypertensive heart disease with heart failure: Secondary | ICD-10-CM | POA: Diagnosis not present

## 2022-01-31 DIAGNOSIS — E039 Hypothyroidism, unspecified: Secondary | ICD-10-CM | POA: Diagnosis not present

## 2022-01-31 DIAGNOSIS — D649 Anemia, unspecified: Secondary | ICD-10-CM | POA: Diagnosis not present

## 2022-01-31 DIAGNOSIS — I5032 Chronic diastolic (congestive) heart failure: Secondary | ICD-10-CM | POA: Diagnosis not present

## 2022-01-31 DIAGNOSIS — Z1211 Encounter for screening for malignant neoplasm of colon: Secondary | ICD-10-CM | POA: Diagnosis not present

## 2022-01-31 NOTE — Telephone Encounter (Signed)
Please cancel chemo in Nov  Please schedule Lab/MD on 10/27 and inform pt of appt

## 2022-01-31 NOTE — Telephone Encounter (Signed)
-----   Message from Katie Server, MD sent at 01/31/2022 12:26 PM EDT ----- She has CHF. Recently admitted. Please cancel lab MD chemo in nov 2023 Arrange her to do a follow up port lab MD, check cbc cmp CA 125, BNP [requested by her cardiologist].

## 2022-02-03 ENCOUNTER — Inpatient Hospital Stay: Payer: Medicare Other

## 2022-02-03 ENCOUNTER — Inpatient Hospital Stay (HOSPITAL_BASED_OUTPATIENT_CLINIC_OR_DEPARTMENT_OTHER): Payer: Medicare Other | Admitting: Oncology

## 2022-02-03 ENCOUNTER — Telehealth: Payer: Self-pay | Admitting: Oncology

## 2022-02-03 ENCOUNTER — Encounter: Payer: Self-pay | Admitting: Oncology

## 2022-02-03 ENCOUNTER — Telehealth: Payer: Self-pay

## 2022-02-03 VITALS — BP 173/87 | HR 67 | Temp 96.3°F | Resp 16 | Wt 110.1 lb

## 2022-02-03 DIAGNOSIS — R1900 Intra-abdominal and pelvic swelling, mass and lump, unspecified site: Secondary | ICD-10-CM | POA: Diagnosis not present

## 2022-02-03 DIAGNOSIS — I509 Heart failure, unspecified: Secondary | ICD-10-CM | POA: Insufficient documentation

## 2022-02-03 DIAGNOSIS — I160 Hypertensive urgency: Secondary | ICD-10-CM

## 2022-02-03 DIAGNOSIS — I5031 Acute diastolic (congestive) heart failure: Secondary | ICD-10-CM | POA: Diagnosis not present

## 2022-02-03 DIAGNOSIS — R809 Proteinuria, unspecified: Secondary | ICD-10-CM | POA: Diagnosis not present

## 2022-02-03 DIAGNOSIS — C569 Malignant neoplasm of unspecified ovary: Secondary | ICD-10-CM

## 2022-02-03 DIAGNOSIS — I10 Essential (primary) hypertension: Secondary | ICD-10-CM | POA: Diagnosis not present

## 2022-02-03 DIAGNOSIS — Z95828 Presence of other vascular implants and grafts: Secondary | ICD-10-CM

## 2022-02-03 DIAGNOSIS — C561 Malignant neoplasm of right ovary: Secondary | ICD-10-CM | POA: Diagnosis not present

## 2022-02-03 DIAGNOSIS — R519 Headache, unspecified: Secondary | ICD-10-CM | POA: Diagnosis not present

## 2022-02-03 DIAGNOSIS — Z452 Encounter for adjustment and management of vascular access device: Secondary | ICD-10-CM | POA: Diagnosis not present

## 2022-02-03 DIAGNOSIS — M7989 Other specified soft tissue disorders: Secondary | ICD-10-CM | POA: Diagnosis not present

## 2022-02-03 LAB — COMPREHENSIVE METABOLIC PANEL
ALT: 12 U/L (ref 0–44)
AST: 20 U/L (ref 15–41)
Albumin: 3.7 g/dL (ref 3.5–5.0)
Alkaline Phosphatase: 53 U/L (ref 38–126)
Anion gap: 5 (ref 5–15)
BUN: 19 mg/dL (ref 8–23)
CO2: 23 mmol/L (ref 22–32)
Calcium: 9.2 mg/dL (ref 8.9–10.3)
Chloride: 105 mmol/L (ref 98–111)
Creatinine, Ser: 0.95 mg/dL (ref 0.44–1.00)
GFR, Estimated: >60 mL/min (ref >60)
Glucose, Bld: 115 mg/dL — ABNORMAL HIGH (ref 70–99)
Potassium: 3.9 mmol/L (ref 3.5–5.1)
Sodium: 133 mmol/L — ABNORMAL LOW (ref 135–145)
Total Bilirubin: 0.5 mg/dL (ref 0.3–1.2)
Total Protein: 7 g/dL (ref 6.5–8.1)

## 2022-02-03 LAB — BRAIN NATRIURETIC PEPTIDE: B Natriuretic Peptide: 2073.3 pg/mL — ABNORMAL HIGH (ref 0.0–100.0)

## 2022-02-03 LAB — CBC WITH DIFFERENTIAL/PLATELET
Abs Immature Granulocytes: 0 10*3/uL (ref 0.00–0.07)
Basophils Absolute: 0 10*3/uL (ref 0.0–0.1)
Basophils Relative: 1 %
Eosinophils Absolute: 0.1 10*3/uL (ref 0.0–0.5)
Eosinophils Relative: 3 %
HCT: 27.5 % — ABNORMAL LOW (ref 36.0–46.0)
Hemoglobin: 9 g/dL — ABNORMAL LOW (ref 12.0–15.0)
Immature Granulocytes: 0 %
Lymphocytes Relative: 31 %
Lymphs Abs: 1 10*3/uL (ref 0.7–4.0)
MCH: 34.7 pg — ABNORMAL HIGH (ref 26.0–34.0)
MCHC: 32.7 g/dL (ref 30.0–36.0)
MCV: 106.2 fL — ABNORMAL HIGH (ref 80.0–100.0)
Monocytes Absolute: 0.4 10*3/uL (ref 0.1–1.0)
Monocytes Relative: 11 %
Neutro Abs: 1.8 10*3/uL (ref 1.7–7.7)
Neutrophils Relative %: 54 %
Platelets: 127 10*3/uL — ABNORMAL LOW (ref 150–400)
RBC: 2.59 MIL/uL — ABNORMAL LOW (ref 3.87–5.11)
RDW: 13.4 % (ref 11.5–15.5)
WBC: 3.3 10*3/uL — ABNORMAL LOW (ref 4.0–10.5)
nRBC: 0 % (ref 0.0–0.2)

## 2022-02-03 MED ORDER — SODIUM CHLORIDE 0.9% FLUSH
10.0000 mL | Freq: Once | INTRAVENOUS | Status: AC
  Administered 2022-02-03: 10 mL via INTRAVENOUS
  Filled 2022-02-03: qty 10

## 2022-02-03 MED ORDER — HEPARIN SOD (PORK) LOCK FLUSH 100 UNIT/ML IV SOLN
500.0000 [IU] | Freq: Once | INTRAVENOUS | Status: AC
  Administered 2022-02-03: 500 [IU] via INTRAVENOUS
  Filled 2022-02-03: qty 5

## 2022-02-03 NOTE — Assessment & Plan Note (Signed)
Follow up with cardiology

## 2022-02-03 NOTE — Telephone Encounter (Signed)
Request for test addon was faxed to [facility] path.   Test: Folate receptor alpha Specimen: [id number] (first choice), if not enough tissue then try [id number]

## 2022-02-03 NOTE — Assessment & Plan Note (Addendum)
Labs reviewed and discussed with patient. Recent hospitalization medical records were reviewed.  Discontinue Doxorubicin due to acute CHF with preserved EF.  Repeat CA125 today Recommend to repeat CT chest abdomen pelvis w contrast for surveillance.  check Folate receptor alpha status for eligibility of  Mirvetuximab

## 2022-02-03 NOTE — Telephone Encounter (Signed)
CT message sent to be scheduled, Order recieved 10/27, Message sent 8:58am

## 2022-02-03 NOTE — Assessment & Plan Note (Signed)
Better controlled, still need to be optimized.  She was seen by cardiology during her admission and medication was adjusted.  She has upcoming cardiology appt.

## 2022-02-03 NOTE — Progress Notes (Signed)
Hematology/Hill Progress note [phone] [phone]         Patient Care Team: Katie Athens, MD as PCP - General (Internal Medicine) Katie Jacks, RN as Registered Nurse Katie Ends, MD as Referring Physician (Obstetrics and Gynecology) Katie Server, MD as Consulting Physician ([facility]) Katie Dry, MD as Referring Physician (Obstetrics and Gynecology)  ASSESSMENT & PLAN:   Cancer Staging  Malignant neoplasm of ovary [facility]) Staging form: Ovary, Fallopian Tube, and Primary Peritoneal Carcinoma, AJCC 8th Edition - Clinical stage from 11/22/2016: Stage IIIC (cT3c, cN1b, cM0) - Signed by Katie Server, MD on 11/22/2016   Malignant neoplasm of ovary [facility]) Labs reviewed and discussed with patient. Recent hospitalization medical records were reviewed.  Discontinue Doxorubicin due to acute CHF with preserved EF.  Repeat CA125 today Recommend to repeat CT chest abdomen pelvis w contrast for surveillance.  check Folate receptor alpha status for eligibility of  Mirvetuximab   CHF (congestive heart failure) ([place]) Follow up with cardiology  Hypertension Better controlled, still need to be optimized.  She was seen by cardiology during her admission and medication was adjusted.  She has upcoming cardiology appt.      Orders Placed This Encounter  Procedures   CT CHEST ABDOMEN PELVIS W CONTRAST    Standing Status:   Future    Standing Expiration Date:   02/04/2023    Order Specific Question:   Preferred imaging location?    Answer:   [facility]    Order Specific Question:   Is Oral Contrast requested for this exam?    Answer:   Yes, Per Radiology protocol   Follow-up  TBD,  All questions were answered. The patient knows to call the clinic with any problems, questions or concerns.  Katie Hill 02/03/2022     CHIEF COMPLAINTS/PURPOSE OF Visit Follow up for chemotherapy for ovarian  cancer.  HISTORY OF PRESENTING ILLNESS: Katie Hill 71 y.o. female presents for follow up of management of recurrent  ovarian cancer. [facility] History  Malignant neoplasm of ovary ([place])  09/20/2011 Imaging   CT chest abdomen pelvis w contrast 1. No significant interval changed compared with the previous exam.2. Stable appearance of soft tissue attenuating lesion in the posterolateral right hemipelvis. This may represent a treated site of peritoneal disease.3. Unchanged appearance of borderline enlarged left periaortic and aortocaval retroperitoneal lymph nodes.4. Unchanged 6 mm left lower lobe lung nodule. 5. No new signs of metastatic disease within the chest, abdomen or pelvis. 6. Aortic Atherosclerosis (ICD10-I70.0) and Emphysema    11/20/2016 Initial Diagnosis   Malignant neoplasm of ovary   Pathology 11/16/2016 Surgical Pathology  CASE: [id number]  PATIENT: Katie Hill  Surgical Pathology Report   SPECIMEN SUBMITTED:  A. Retroperitoneal adenopathy, left  DIAGNOSIS:  A. LYMPH NODE, LEFT RETROPERITONEAL; CT-GUIDED CORE BIOPSY:  - METASTATIC HIGH-GRADE SEROUS CARCINOMA.  Pathology 03/14/2017   DIAGNOSIS:  A. OMENTUM; OMENTECTOMY:  - NO TUMOR SEEN.  - ONE NEGATIVE LYMPH NODE (0/1).   B.  RIGHT FALLOPIAN TUBE AND OVARY; SALPINGO-OOPHORECTOMY:  - SMALL FOCI OF HIGH GRADE SEROUS CARCINOMA INVOLVING THE OVARY.  - MARKED THERAPY RELATED CHANGE.  - NO TUMOR SEEN IN THE FALLOPIAN TUBE.   C.  UTERUS, CERVIX, LEFT FALLOPIAN TUBE AND OVARY; HYSTERECTOMY AND LEFT  SALPINGO-OOPHORECTOMY:  - NABOTHIAN CYSTS.  - CYSTIC ATROPHY OF THE ENDOMETRIUM.  - SEROSAL ADHESIONS.  - UNREMARKABLE FALLOPIAN TUBE.  - OVARY SHOWING TREATMENT RELATED CHANGE.   D.  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    12/01/2016 -  Chemotherapy   Carboplatin and taxol x 4 neoadjuvant chemotherapy    03/14/2017 Surgery    Patient had debulking surgery on 03/14/2017. She had a laparoscopy with conversion to laparotomy, total hysterectomy, with bilateral salpingo oophorectomy, right aortic lymph node dissection, omentectomy. Pathology showed small foci of residual disease in ovary.   HRD positive, declined Olarparib maintenance trial    03/28/2017 -  Chemotherapy   Adjuvant carbo and taxol x3 cycles   02/28/2018 Progression   Local Recurrence. CA125 146 CT abdomen pelvis w contrast showed interval enlargement of an aortocaval lymph node which currently measures 1.4 cm in short axis (axial image 75 of series 2). This lymph node is in close proximity to the previously resected retroperitoneal lymphadenopathy and is very concerning for an additional nodal metastasis.    03/15/2018 -  Chemotherapy   03/15/2018-05/17/2018 Carboplatin and Taxol x 4.  CA125 decrease from 49.7 to 6 after 4 cycles of treatment.  05/17/2018-10/29/20   Olarparib 350m BID    11/05/2018 Imaging   MRI abdomenPelvis on 11/05/2018 showed Interval progression of, now measuring 2.7 x 2.3 cm and concerning for progression of metastatic disease.retrocaval lymph node in the abdomen Olaparib was held for short period of time. Her CA125 trended down again.  Dr. BFransisca Connorsrecommending resume olaparib and continue monitor.   10/06/2020 Progression    CT chest abdomen pelvis with contrast showed new lymph nodes in the prevascular space of the upper anterior mediastinum most consistent with metastasis.  Single new right lower lobe pulmonary nodule is concerning for early pulmonary metastasis.  Interval enlargement of the left periaortic retroperitoneal lymph node.  Stable adjacent aorto caval adenopathy.    Genetic Testing   Genetic testing negative for 83 genes on [facility]'s Multi-Cancer panel (ALK, APC, ATM, AXIN2, BAP1, BARD1, BLM, BMPR1A, BRCA1, BRCA2, BRIP1, CASR, CDC73, CDH1, CDK4, CDKN1B, CDKN1C, CDKN2A, CEBPA, CHEK2, CTNNA1, DICER1, DIS3L2, EGFR, EPCAM,  FH, FLCN, GATA2, GPC3, GREM1, HOXB13, HRAS, KIT, MAX, MEN1, MET, MITF, MLH1, MSH2, MSH3, MSH6, MUTYH, NBN, NF1, NF2, NTHL1, PALB2, PDGFRA, PHOX2B, PMS2, POLD1, POLE, POT1, PRKAR1A, PTCH1, PTEN, RAD50, RAD51C, RAD51D, RB1, RECQL4, RET, RUNX1, SDHA, SDHAF2, SDHB, SDHC, SDHD, SMAD4, SMARCA4, SMARCB1, SMARCE1, STK11, SUFU, TERC, TERT, TMEM127, TP53, TSC1, TSC2, VHL, WRN, WT1).   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  Myraid testing negative for somatic BRACA1/2, positive for HRD.    11/03/2020 -  Chemotherapy   Liposomal Doxorubicin + Bevacizumab q28d   11/03/20 -05/20/21 Doxil + Bevacizumab q28d  06/03/21 Doxil only due to proteinuria.  07/01/21-07/15/21  Resumed on Doxil + Bevacizumab 07/29/21 Doxil only due to proteinuria.    10/14/2021 Echocardiogram    Left ventricular ejection fraction, by estimation, is 55 to 60%   12/16/2021 Imaging   UKorealower extremities 1. No evidence of deep venous thrombosis in either lower extremity. 2. 1.9 cm left popliteal fossa Baker's cyst.   12/23/2021 Echocardiogram   LVEF 55%   12/26/2021 Imaging   CT chest abdomen pelvis w contrast   Chest Impression:   1. New RIGHT pleural effusion and new mild nodularity along the RIGHT oblique fissure. Recommend close attention on follow-up for potential pleural metastasis in the RIGHT hemithorax. 2. Stable LEFT lobe pulmonary nodule.3. No mediastinal lymphadenopathy.   Abdomen / Pelvis Impression:   1. Stable soft tissue lesion in the deep RIGHT pelvis. 2. Mass  stable small periaortic lymph nodes. 3. No intraperitoneal free fluid the abdomen or pelvis.   01/20/2022 - 01/23/2022 Hospital Admission   Hospitalized due to acute on chronic diastolic CHF, hypertension urgency, She was seen by cardiology and nephrology. BNP was significantly elevated at 4500 She was discharged home with Losartan, Aldactone, Lasix, beta blocker and hydralazine.    High grade ovarian cancer [facility])     INTERVAL HISTORY TEYONNA PLAISTED is a 71 y.o. female who has above history reviewed by me today presents for follow up visit for recurrent Ovarian cancer Patient was accompanied by her husband.   Hospitalized for hypertension emergency, CHF was seen by cardiology.  Today she feels better, BP is 173/87.HR 67 No chest pain, sob, nausea vomiting.  Still has leg swelling.   . Review of Systems  Constitutional:  Positive for fatigue. Negative for appetite change, chills and fever.  HENT:   Negative for hearing loss and voice change.   Eyes:  Negative for eye problems.  Respiratory:  Negative for chest tightness and cough.   Cardiovascular:  Positive for leg swelling. Negative for chest pain.  Gastrointestinal:  Negative for abdominal distention, abdominal pain and blood in stool.  Endocrine: Negative for hot flashes.  Genitourinary:  Negative for difficulty urinating and frequency.   Musculoskeletal:  Negative for arthralgias.  Skin:  Negative for itching and rash.  Neurological:  Negative for extremity weakness.  Hematological:  Negative for adenopathy.  Psychiatric/Behavioral:  Negative for confusion.      [name] MEDICAL HISTORY: Past Medical History:  Diagnosis Date   (HFpEF) heart failure with preserved ejection fraction ([place])    a. 12/2021 Echo: EF 55%, no rwma, nl RV fxn, mild MR, mild-mod TR. Triv AI.   Anemia    CHF (congestive heart failure) ([facility])    Chronic kidney disease    Dysrhythmia    Essential hypertension    Genetic testing 03/28/2017   Multi-Cancer panel (83 genes) @ [facility] - No pathogenic mutations detected   High grade ovarian cancer ([place]) 11/20/2016   Pelvic mass in female     SURGICAL HISTORY: Past Surgical History:  Procedure Laterality Date   APPENDECTOMY     LAPAROSCOPY N/A 03/14/2017   Procedure: LAPAROSCOPY OPERATIVE;  Surgeon: Mellody Drown, MD;  Location: [facility] ORS;  Service: Gynecology;  Laterality: N/A;   LAPAROTOMY N/A 03/14/2017    Procedure: LAPAROTOMY;  Surgeon: Mellody Drown, MD;  Location: [facility] ORS;  Service: Gynecology;  Laterality: N/A;   LYMPH NODE DISSECTION N/A 03/14/2017   Procedure: LYMPH NODE DISSECTION;  Surgeon: Mellody Drown, MD;  Location: [facility] ORS;  Service: Gynecology;  Laterality: N/A;   OMENTECTOMY N/A 03/14/2017   Procedure: OMENTECTOMY;  Surgeon: Mellody Drown, MD;  Location: [facility] ORS;  Service: Gynecology;  Laterality: N/A;   PORTA CATH INSERTION N/A 11/27/2016   Procedure: Glori Luis Cath Insertion;  Surgeon: Algernon Huxley, MD;  Location: [facility];  Service: Cardiovascular;  Laterality: N/A;    SOCIAL HISTORY: Social History   Tobacco Use   Smoking status: Never   Smokeless tobacco: Never  Vaping Use   Vaping Use: Never used  Substance Use Topics   Alcohol use: Not Currently   Drug use: No     FAMILY HISTORY Family History  Problem Relation Age of Onset   Throat cancer Cousin    Throat cancer Cousin    Leukemia Cousin     ALLERGIES:  is allergic to omeprazole.  MEDICATIONS:  Current Outpatient Medications  Medication Sig Dispense Refill   aspirin 81 MG chewable tablet Chew 1 tablet (81 mg total) by mouth daily. 30 tablet 0   feeding supplement (ENSURE ENLIVE / ENSURE PLUS) LIQD Take 237 mLs by mouth 3 (three) times daily between meals. 14220 mL 0   furosemide (LASIX) 40 MG tablet Take one tablet daily (can take extra tab in afternoon if gain 3 lbs in a day or 5 lbs in a week) 90 tablet 3   hydrALAZINE (APRESOLINE) 25 MG tablet Take 1 tablet (25 mg total) by mouth 3 (three) times daily. 270 tablet 3   levothyroxine (SYNTHROID) 100 MCG tablet Take 1 tablet (100 mcg total) by mouth daily. 90 tablet 3   lidocaine-prilocaine (EMLA) cream Apply 1 application topically as needed. Apply small amount to port site at least 1 hour prior to it being accessed, cover with plastic wrap 30 g 1   losartan (COZAAR) 100 MG tablet Take 1 tablet (100 mg total) by mouth daily. 90 tablet 3    metoprolol succinate (TOPROL XL) 25 MG 24 hr tablet Take 1 tablet (25 mg total) by mouth at bedtime. 90 tablet 3   Multiple Vitamin (MULTIVITAMIN WITH MINERALS) TABS tablet Take 1 tablet by mouth daily. (Patient not taking: Reported on 01/30/2022)     ondansetron (ZOFRAN) 8 MG tablet Take 1 tablet (8 mg total) by mouth every 8 (eight) hours as needed for nausea or vomiting. 60 tablet 1   spironolactone (ALDACTONE) 25 MG tablet Take 1 tablet (25 mg total) by mouth daily. 90 tablet 3   No current facility-administered medications for this visit.   Facility-Administered Medications Ordered in Other Visits  Medication Dose Route Frequency Provider Last Rate Last Admin   pegfilgrastim (NEULASTA ONPRO KIT) 6 MG/0.6ML injection             PHYSICAL EXAMINATION:  ECOG PERFORMANCE STATUS: 0 - Asymptomatic There were no vitals filed for this visit.   There were no vitals filed for this visit.    Physical Exam Constitutional:      General: She is not in acute distress.    Appearance: She is not diaphoretic.     Comments: Thin built, she walks independantly  HENT:     Head: Normocephalic and atraumatic.     Nose: Nose normal.     Mouth/Throat:     Pharynx: No oropharyngeal exudate.  Eyes:     General: No scleral icterus.    Pupils: Pupils are equal, round, and reactive to light.  Neck:     Vascular: No JVD.  Cardiovascular:     Rate and Rhythm: Normal rate and regular rhythm.     Heart sounds: No murmur heard. Pulmonary:     Effort: Pulmonary effort is normal. No respiratory distress.     Breath sounds: Normal breath sounds. No rales.  Chest:     Chest wall: No tenderness.  Abdominal:     General: Bowel sounds are normal. There is no distension.     Palpations: Abdomen is soft.     Tenderness: There is no abdominal tenderness.  Musculoskeletal:        General: Normal range of motion.     Cervical back: Normal range of motion and neck supple.     Right lower leg: Edema  present.     Left lower leg: Edema present.  Lymphadenopathy:     Cervical: No cervical adenopathy.  Skin:    General: Skin is warm and Hill.  Findings: No erythema or rash.     Comments: Right anterior medi port +   Neurological:     Mental Status: She is alert and oriented to person, place, and time.     Cranial Nerves: No cranial nerve deficit.     Motor: No abnormal muscle tone.     Coordination: Coordination normal.  Psychiatric:        Mood and Affect: Affect normal.        Judgment: Judgment normal.        LABORATORY DATA: I have personally reviewed the data as listed:     Latest Ref Rng & Units 01/23/2022    5:20 AM 01/22/2022    4:17 AM 01/21/2022   12:01 AM  CBC  WBC 4.0 - 10.5 K/uL 3.8  3.4  5.1   Hemoglobin 12.0 - 15.0 g/dL 10.0  9.9  10.0   Hematocrit 36.0 - 46.0 % 30.9  30.2  30.3   Platelets 150 - 400 K/uL 145  144  173       Latest Ref Rng & Units 01/23/2022    5:20 AM 01/22/2022    1:48 PM 01/22/2022    4:17 AM  CMP  Glucose 70 - 99 mg/dL 88  112  97   BUN 8 - 23 mg/dL _0 Creatinine 0.44 - 1.00 mg/dL 1.17  0.89  1.03   Sodium 135 - 145 mmol/L 138  134  137   Potassium 3.5 - 5.1 mmol/L 4.1  4.1  2.6   Chloride 98 - 111 mmol/L 107  103  105   CO2 22 - 32 mmol/L _1 Calcium 8.9 - 10.3 mg/dL 9.3  9.1  8.9      RADIOGRAPHIC STUDIES: I have personally reviewed the radiological images as listed and agreed with the findings in the report. US RENAL  Result Date: 01/21/2022 CLINICAL DATA:  Edema. EXAM: RENAL / URINARY TRACT ULTRASOUND COMPLETE COMPARISON:  None Available. FINDINGS: Right Kidney: Renal measurements: 11.9 x 4.7 x 5.4 cm = volume: 157.2 mL. Echogenicity within normal limits. No mass or hydronephrosis visualized. Left Kidney: Renal measurements: 11.4 x 4.4 x 5.4 cm = volume: 144.2 mL. Echogenicity within normal limits. No mass or hydronephrosis visualized. Bladder: Appears normal for degree of bladder distention. Other:  Bilateral pleural effusions. IMPRESSION: Normal sonographic appearance of the kidneys.  Normal bladder. Electronically Signed   By: Lajean Manes M.D.   On: 01/21/2022 13:06   CT Head Wo Contrast  Result Date: 01/20/2022 CLINICAL DATA:  Headache, sudden, severe EXAM: CT HEAD WITHOUT CONTRAST TECHNIQUE: Contiguous axial images were obtained from the base of the skull through the vertex without intravenous contrast. RADIATION DOSE REDUCTION: This exam was performed according to the departmental dose-optimization program which includes automated exposure control, adjustment of the mA and/or kV according to patient size and/or use of iterative reconstruction technique. COMPARISON:  CT head January 13, 2022. FINDINGS: Brain: No evidence of acute infarction, hemorrhage, hydrocephalus, extra-axial collection or mass lesion/mass effect. Vascular: No hyperdense vessel. Skull: No acute fracture. Sinuses/Orbits: Clear sinuses.  No acute orbital findings. Other: No mastoid effusions IMPRESSION: No evidence of acute intracranial abnormality. Electronically Signed   By: Margaretha Sheffield M.D.   On: 01/20/2022 16:37   DG Chest 2 View  Result Date: 01/20/2022 CLINICAL DATA:  Hypertension EXAM: CHEST - 2 VIEW COMPARISON:  CT 12/26/2021 FINDINGS: Right-sided central venous port tip over the cavoatrial region.  Small bilateral pleural effusions with airspace disease at both bases. Partially obscured cardiomediastinal silhouette. No pneumothorax. IMPRESSION: Small bilateral pleural effusions with basilar atelectasis or pneumonia Electronically Signed   By: Donavan Foil M.D.   On: 01/20/2022 16:35   CT HEAD WO CONTRAST (5MM)  Result Date: 01/13/2022 CLINICAL DATA:  Headaches EXAM: CT HEAD WITHOUT CONTRAST TECHNIQUE: Contiguous axial images were obtained from the base of the skull through the vertex without intravenous contrast. RADIATION DOSE REDUCTION: This exam was performed according to the departmental dose-optimization  program which includes automated exposure control, adjustment of the mA and/or kV according to patient size and/or use of iterative reconstruction technique. COMPARISON:  None Available. FINDINGS: Brain: No evidence of acute infarction, hemorrhage, hydrocephalus, extra-axial collection or mass lesion/mass effect. Vascular: No hyperdense vessel or unexpected calcification. Skull: No osseous abnormality. Sinuses/Orbits: Visualized paranasal sinuses are clear. Visualized mastoid sinuses are clear. Visualized orbits demonstrate no focal abnormality. Other: None IMPRESSION: No acute intracranial findings. Electronically Signed   By: Kathreen Devoid M.D.   On: 01/13/2022 10:36   CT CHEST ABDOMEN PELVIS W CONTRAST  Result Date: 12/27/2021 CLINICAL DATA:  Ovarian cancer. Leg swelling. Hysterectomy 2018. Chemotherapy ongoing. * Tracking Code: BO * EXAM: CT CHEST, ABDOMEN, AND PELVIS WITH CONTRAST TECHNIQUE: Multidetector CT imaging of the chest, abdomen and pelvis was performed following the standard protocol during bolus administration of intravenous contrast. RADIATION DOSE REDUCTION: This exam was performed according to the departmental dose-optimization program which includes automated exposure control, adjustment of the mA and/or kV according to patient size and/or use of iterative reconstruction technique. CONTRAST:  172m OMNIPAQUE IOHEXOL 300 MG/ML  SOLN COMPARISON:  CT 09/19/2021 CT CHEST FINDINGS Cardiovascular: Port in the anterior chest wall with tip in distal SVC. No significant vascular findings. Normal heart size. No pericardial effusion. Mediastinum/Nodes: No axillary or supraclavicular adenopathy. No mediastinal or hilar adenopathy. No pericardial fluid. Esophagus normal. Lungs/Pleura: New moderate size layering RIGHT pleural effusion. LEFT lobe pulmonary nodule measures 6 mm (image 99/4) is unchanged. New nodule along the RIGHT oblique fissure measures 4 mm (image 61/4). Subtle pleural thickening more  medially along the RIGHT oblique fissure is more prominent on image 75/4. Musculoskeletal: No aggressive osseous lesion. CT ABDOMEN AND PELVIS FINDINGS Hepatobiliary: No focal hepatic lesion. No biliary ductal dilatation. Gallbladder is normal. Common bile duct is normal. Pancreas: Pancreas is normal. No ductal dilatation. No pancreatic inflammation. Spleen: Normal spleen Adrenals/urinary tract: Adrenal glands and kidneys are normal. The ureters and bladder normal. Stomach/Bowel: Stomach, small bowel, appendix, and cecum are normal. The colon and rectosigmoid colon are normal. Vascular/Lymphatic: Abdominal aorta is normal caliber. Small periaortic lymph nodes again noted. Largest focus lymphoid tissue measures 10 mm on image 69/2 not changed from prior. No new lymphadenopathy abdomen pelvis. Reproductive: Post hysterectomy anatomy. Other: soft tissue lesion in the posterior RIGHT pelvis anterior to the sacrum measures 14 x 29 mm (image 109/2) compared to 14 mm x 30 mm. Visually lesion looks similar. No new peritoneal implants Musculoskeletal: No aggressive osseous lesion. IMPRESSION: Chest Impression: 1. New RIGHT pleural effusion and new mild nodularity along the RIGHT oblique fissure. Recommend close attention on follow-up for potential pleural metastasis in the RIGHT hemithorax. 2. Stable LEFT lobe pulmonary nodule. 3. No mediastinal lymphadenopathy. Abdomen / Pelvis Impression: 1. Stable soft tissue lesion in the deep RIGHT pelvis. 2. Mass stable small periaortic lymph nodes. 3. No intraperitoneal free fluid the abdomen or pelvis. Electronically Signed   By: SHelane GuntherD.  On: 12/27/2021 10:11   ECHOCARDIOGRAM COMPLETE  Result Date: 12/23/2021    ECHOCARDIOGRAM REPORT   Patient Name:   SYLVANNA BURGGRAF Date of Exam: 12/23/2021 Medical Rec #:  [id number]        Height:       70.0 in Accession #:    [id number]       Weight:       116.8 lb Date of Birth:  Jul 05, 1950        BSA:          1.661 m Patient  Age:    69 years         BP:           170/83 mmHg Patient Gender: F                HR:           79 bpm. Exam Location:  [facility] Procedure: 2D Echo, Cardiac Doppler, Color Doppler and Strain Analysis Indications:     Z51.81 Z79.899                  Encounter for monitoring cardiotoxic drug therapy  History:         Patient has prior history of Echocardiogram examinations, most                  recent 10/14/2021. Risk Factors:Hypertension.  Sonographer:     Sherrie Sport Referring Phys:  Katie Hill Diagnosing Phys: Ida Rogue MD  Sonographer Comments: Global longitudinal strain was attempted. IMPRESSIONS  1. Left ventricular ejection fraction, by estimation, is 55%. The left ventricle has normal function. The left ventricle has no regional wall motion abnormalities. The average left ventricular global longitudinal strain is -13.5 %.  2. Right ventricular systolic function is normal. The right ventricular size is normal.  3. The mitral valve is normal in structure. Mild mitral valve regurgitation. No evidence of mitral stenosis.  4. Tricuspid valve regurgitation is mild to moderate.  5. The aortic valve is tricuspid. Aortic valve regurgitation is trivial. No aortic stenosis is present.  6. The inferior vena cava is normal in size with greater than 50% respiratory variability, suggesting right atrial pressure of 3 mmHg. FINDINGS  Left Ventricle: Left ventricular ejection fraction, by estimation, is 55%. The left ventricle has normal function. The left ventricle has no regional wall motion abnormalities. The average left ventricular global longitudinal strain is -13.5 %. The left  ventricular internal cavity size was normal in size. There is no left ventricular hypertrophy. Left ventricular diastolic parameters are indeterminate. Right Ventricle: The right ventricular size is normal. No increase in right ventricular wall thickness. Right ventricular systolic function is normal. Left Atrium: Left atrial size was normal in  size. Right Atrium: Right atrial size was normal in size. Pericardium: There is no evidence of pericardial effusion. Mitral Valve: The mitral valve is normal in structure. Mild mitral valve regurgitation. No evidence of mitral valve stenosis. Tricuspid Valve: The tricuspid valve is normal in structure. Tricuspid valve regurgitation is mild to moderate. No evidence of tricuspid stenosis. Aortic Valve: The aortic valve is tricuspid. Aortic valve regurgitation is trivial. No aortic stenosis is present. Aortic valve mean gradient measures 2.0 mmHg. Aortic valve peak gradient measures 4.3 mmHg. Aortic valve area, by VTI measures 2.38 cm. Pulmonic Valve: The pulmonic valve was normal in structure. Pulmonic valve regurgitation is not visualized. No evidence of pulmonic stenosis. Aorta: The aortic root is normal in size and structure. Venous: The  inferior vena cava is normal in size with greater than 50% respiratory variability, suggesting right atrial pressure of 3 mmHg. IAS/Shunts: No atrial level shunt detected by color flow Doppler.  LEFT VENTRICLE PLAX 2D LVIDd:         5.00 cm LVIDs:         3.75 cm   2D Longitudinal Strain LV PW:         1.00 cm   2D Strain GLS Avg:     -13.5 % LV IVS:        0.90 cm LVOT diam:     2.10 cm LV SV:         48 LV SV Index:   29 LVOT Area:     3.46 cm  RIGHT VENTRICLE RV Basal diam:  3.60 cm RV Mid diam:    3.20 cm RV S prime:     13.60 cm/s TAPSE (M-mode): 2.8 cm LEFT ATRIUM             Index        RIGHT ATRIUM           Index LA diam:        3.50 cm 2.11 cm/m   RA Area:     22.90 cm LA Vol (A2C):   38.8 ml 23.36 ml/m  RA Volume:   74.30 ml  44.74 ml/m LA Vol (A4C):   34.4 ml 20.71 ml/m LA Biplane Vol: 37.1 ml 22.34 ml/m  AORTIC VALVE AV Area (Vmax):    2.32 cm AV Area (Vmean):   2.24 cm AV Area (VTI):     2.38 cm AV Vmax:           104.00 cm/s AV Vmean:          70.600 cm/s AV VTI:            0.204 m AV Peak Grad:      4.3 mmHg AV Mean Grad:      2.0 mmHg LVOT Vmax:          69.70 cm/s LVOT Vmean:        45.600 cm/s LVOT VTI:          0.140 m LVOT/AV VTI ratio: 0.69  AORTA Ao Root diam: 3.25 cm MITRAL VALVE               TRICUSPID VALVE MV Area (PHT): 4.10 cm    TR Peak grad:   21.0 mmHg MV Decel Time: 185 msec    TR Vmax:        229.00 cm/s MV E velocity: 83.30 cm/s MV A velocity: 53.50 cm/s  SHUNTS MV E/A ratio:  1.56        Systemic VTI:  0.14 m                            Systemic Diam: 2.10 cm Ida Rogue MD Electronically signed by Ida Rogue MD Signature Date/Time: 12/23/2021/10:04:27 AM    Final    US Venous Img Lower Bilateral  Result Date: 12/16/2021 CLINICAL DATA:  Bilateral leg swelling. EXAM: BILATERAL LOWER EXTREMITY VENOUS DOPPLER ULTRASOUND TECHNIQUE: Gray-scale sonography with compression, as well as color and duplex ultrasound, were performed to evaluate the deep venous system(s) from the level of the common femoral vein through the popliteal and proximal calf veins. COMPARISON:  None Available. FINDINGS: VENOUS Normal compressibility of the common femoral, superficial femoral, and popliteal veins, as well as the  visualized calf veins. Visualized portions of profunda femoral vein and great saphenous vein unremarkable. No filling defects to suggest DVT on grayscale or color Doppler imaging. Doppler waveforms show normal direction of venous flow, normal respiratory plasticity and response to augmentation. OTHER 1.9 x 0.8 x 0.6 cm fluid collection left popliteal fossa suggest Baker's cyst. Limitations: none IMPRESSION: 1. No evidence of deep venous thrombosis in either lower extremity. 2. 1.9 cm left popliteal fossa Baker's cyst. Electronically Signed   By: Misty Stanley M.D.   On: 12/16/2021 11:48   VAS US CAROTID  Result Date: 11/18/2021 Carotid Arterial Duplex Study Patient Name:  AMBRA HAVERSTICK  Date of Exam:   11/15/2021 Medical Rec #: [id number]         Accession #:    [id number] Date of Birth: Mar 07, 1951         Patient Gender: F Patient Age:   45 years  Exam Location:  [facility] Procedure:      VAS US CAROTID Referring Phys: Eulogio Ditch --------------------------------------------------------------------------------  Indications: Left bruit. Performing Technologist: Almira Coaster RVS  Examination Guidelines: A complete evaluation includes B-mode imaging, spectral Doppler, color Doppler, and power Doppler as needed of all accessible portions of each vessel. Bilateral testing is considered an integral part of a complete examination. Limited examinations for reoccurring indications may be performed as noted.  Right Carotid Findings: +----------+--------+--------+--------+------------------+--------+           PSV cm/sEDV cm/sStenosisPlaque DescriptionComments +----------+--------+--------+--------+------------------+--------+ CCA Prox  96      12                                         +----------+--------+--------+--------+------------------+--------+ CCA Mid   72      15                                         +----------+--------+--------+--------+------------------+--------+ CCA Distal53      13                                         +----------+--------+--------+--------+------------------+--------+ ICA Prox  52      12                                         +----------+--------+--------+--------+------------------+--------+ ICA Mid   99      32                                         +----------+--------+--------+--------+------------------+--------+ ICA Distal95      29                                         +----------+--------+--------+--------+------------------+--------+ ECA       58      0                                          +----------+--------+--------+--------+------------------+--------+ +----------+--------+-------+--------+-------------------+  PSV cm/sEDV cmsDescribeArm Pressure (mmHG) +----------+--------+-------+--------+-------------------+  [id number]      0                                  +----------+--------+-------+--------+-------------------+ +---------+--------+--+--------+--+ VertebralPSV cm/s50EDV cm/s13 +---------+--------+--+--------+--+  Left Carotid Findings: +----------+--------+--------+--------+------------------+--------+           PSV cm/sEDV cm/sStenosisPlaque DescriptionComments +----------+--------+--------+--------+------------------+--------+ CCA Prox  83      18                                         +----------+--------+--------+--------+------------------+--------+ CCA Mid   92      22                                         +----------+--------+--------+--------+------------------+--------+ CCA Distal70      21                                         +----------+--------+--------+--------+------------------+--------+ ICA Prox  37      13                                         +----------+--------+--------+--------+------------------+--------+ ICA Mid   83      22                                         +----------+--------+--------+--------+------------------+--------+ ICA Distal99      21                                         +----------+--------+--------+--------+------------------+--------+ ECA       55      7                                          +----------+--------+--------+--------+------------------+--------+ +----------+--------+--------+--------+-------------------+           PSV cm/sEDV cm/sDescribeArm Pressure (mmHG) +----------+--------+--------+--------+-------------------+ Subclavian101     0                                   +----------+--------+--------+--------+-------------------+ +---------+--------+--+--------+--+ VertebralPSV cm/s44EDV cm/s13 +---------+--------+--+--------+--+   Summary: Right Carotid: Velocities in the right ICA are consistent with a 1-39% stenosis. Left Carotid: Velocities in the left ICA  are consistent with a 1-39% stenosis. Vertebrals: Bilateral vertebral arteries demonstrate antegrade flow. *See table(s) above for measurements and observations.  Electronically signed by Leotis Pain MD on 11/18/2021 at 9:46:40 AM.    Final

## 2022-02-05 LAB — CA 125: Cancer Antigen (CA) 125: 74.5 U/mL — ABNORMAL HIGH (ref 0.0–38.1)

## 2022-02-09 DIAGNOSIS — R600 Localized edema: Secondary | ICD-10-CM | POA: Diagnosis not present

## 2022-02-09 DIAGNOSIS — R809 Proteinuria, unspecified: Secondary | ICD-10-CM | POA: Diagnosis not present

## 2022-02-09 DIAGNOSIS — I10 Essential (primary) hypertension: Secondary | ICD-10-CM | POA: Diagnosis not present

## 2022-02-09 NOTE — Telephone Encounter (Signed)
Called and spoke to [place] at path lab to follow up on request. He states that test was sent out to [facility] on 10/31 and resutls not expected to return until sometime next week.

## 2022-02-10 ENCOUNTER — Ambulatory Visit: Payer: Medicare Other | Admitting: Oncology

## 2022-02-10 ENCOUNTER — Ambulatory Visit: Payer: Medicare Other

## 2022-02-10 ENCOUNTER — Other Ambulatory Visit: Payer: Medicare Other

## 2022-02-10 DIAGNOSIS — Z1211 Encounter for screening for malignant neoplasm of colon: Secondary | ICD-10-CM | POA: Diagnosis not present

## 2022-02-13 ENCOUNTER — Encounter: Payer: Self-pay | Admitting: Oncology

## 2022-02-14 ENCOUNTER — Telehealth: Payer: Self-pay

## 2022-02-14 NOTE — Telephone Encounter (Signed)
-----   Message from Katie Server, MD sent at 02/12/2022 10:31 AM EST ----- CA125 has increased. Please check if her CT can be moved up to this week. Thanks.

## 2022-02-14 NOTE — Telephone Encounter (Signed)
Results scanned in media.

## 2022-02-14 NOTE — Telephone Encounter (Signed)
CT contacted pt to schedule this week, but pt was unavailable on the dates available. Will keep appt next week as scheduled.

## 2022-02-18 LAB — COLOGUARD: COLOGUARD: NEGATIVE

## 2022-02-21 ENCOUNTER — Ambulatory Visit
Admission: RE | Admit: 2022-02-21 | Discharge: 2022-02-21 | Disposition: A | Discharge status: Home / Self Care | Payer: Medicare Other | Source: Ambulatory Visit | Attending: Oncology | Admitting: Oncology

## 2022-02-21 DIAGNOSIS — C569 Malignant neoplasm of unspecified ovary: Secondary | ICD-10-CM | POA: Insufficient documentation

## 2022-02-21 DIAGNOSIS — R1900 Intra-abdominal and pelvic swelling, mass and lump, unspecified site: Secondary | ICD-10-CM | POA: Diagnosis not present

## 2022-02-21 DIAGNOSIS — R590 Localized enlarged lymph nodes: Secondary | ICD-10-CM | POA: Diagnosis not present

## 2022-02-21 DIAGNOSIS — I517 Cardiomegaly: Secondary | ICD-10-CM | POA: Diagnosis not present

## 2022-02-21 DIAGNOSIS — J479 Bronchiectasis, uncomplicated: Secondary | ICD-10-CM | POA: Diagnosis not present

## 2022-02-21 DIAGNOSIS — J929 Pleural plaque without asbestos: Secondary | ICD-10-CM | POA: Diagnosis not present

## 2022-02-21 DIAGNOSIS — J90 Pleural effusion, not elsewhere classified: Secondary | ICD-10-CM | POA: Diagnosis not present

## 2022-02-21 MED ORDER — IOHEXOL 300 MG/ML  SOLN
80.0000 mL | Freq: Once | INTRAMUSCULAR | Status: AC | PRN
  Administered 2022-02-21: 80 mL via INTRAVENOUS

## 2022-02-27 ENCOUNTER — Encounter: Payer: Self-pay | Admitting: Cardiology

## 2022-02-27 ENCOUNTER — Ambulatory Visit: Payer: Medicare Other | Attending: Cardiology | Admitting: Cardiology

## 2022-02-27 VITALS — BP 150/84 | HR 72 | Ht 67.0 in | Wt 111.8 lb

## 2022-02-27 DIAGNOSIS — I5032 Chronic diastolic (congestive) heart failure: Secondary | ICD-10-CM | POA: Insufficient documentation

## 2022-02-27 DIAGNOSIS — I10 Essential (primary) hypertension: Secondary | ICD-10-CM | POA: Diagnosis not present

## 2022-02-27 MED ORDER — HYDRALAZINE HCL 50 MG PO TABS: 50.0000 mg | ORAL_TABLET | Freq: Three times a day (TID) | ORAL | 1 refills | Status: DC

## 2022-02-27 NOTE — Patient Instructions (Signed)
Medication Instructions:   Your physician has recommended you make the following change in your medication:    INCREASE your Hydralazine to 50 MG three times a day.   *If you need a refill on your cardiac medications before your next appointment, please call your pharmacy*     Follow-Up: At [facility], you and your health needs are our priority.  As part of our continuing mission to provide you with exceptional heart care, we have created designated Provider Care Teams.  These Care Teams include your primary Cardiologist (physician) and Advanced Practice Providers (APPs -  Physician Assistants and Nurse Practitioners) who all work together to provide you with the care you need, when you need it.  We recommend signing up for the patient portal called "MyChart".  Sign up information is provided on this After Visit Summary.  MyChart is used to connect with patients for Virtual Visits (Telemedicine).  Patients are able to view lab/test results, encounter notes, upcoming appointments, etc.  Non-urgent messages can be sent to your provider as well.   To learn more about what you can do with MyChart, go to [email or web address].    Your next appointment:   6-8 week(s)  The format for your next appointment:   In Person  Provider:   You may see Kate Sable, MD or one of the following Advanced Practice Providers on your designated Care Team:   Murray Hodgkins, NP Christell Faith, PA-C Cadence Kathlen Mody, PA-C Gerrie Nordmann, NP    Other Instructions   Important Information About Sugar

## 2022-02-27 NOTE — Progress Notes (Signed)
Cardiology Office Note:    Date:  02/27/2022   ID:  Katie Hill, DOB 1951-01-14, MRN [id number]  PCP:  Cletis [place], [facility] Cardiologist:  Kate Sable, MD     Referring MD: Verlon Au, NP   Chief Complaint  Patient presents with   New Patient (Initial Visit)     Uncontrolled HTN, Family Hx, ankle swelling    History of Present Illness:    Katie Hill is a 71 y.o. female with a hx of hypertension, HFpEF, ovarian cancer, who presents due to elevated blood pressures.  She was admitted last month with symptoms of shortness of breath, and hypertension.  Systolic blood pressures were in the 190s.  Diagnosed with bilateral pleural effusion, managed with IV Lasix.  Echo showed EF 55%.  Started on oral Lasix upon discharge, leg edema has improved since being discharged.  Blood pressure still elevated at home with systolic in the 751W.  Feels okay, denies chest pain or shortness of breath.  Denies ever smoking although she states being around second hand smoke.  Incident of bradycardia noted with beta-blocker usage during hospitalization, start beta-blocker was discontinued.  Echocardiogram 12/2021 EF 55%  Past Medical History:  Diagnosis Date   (HFpEF) heart failure with preserved ejection fraction ([place])    a. 12/2021 Echo: EF 55%, no rwma, nl RV fxn, mild MR, mild-mod TR. Triv AI.   Anemia    CHF (congestive heart failure) (HCC)    Chronic kidney disease    Dysrhythmia    Essential hypertension    Genetic testing 03/28/2017   Multi-Cancer panel (83 genes) @ [facility] - No pathogenic mutations detected   High grade ovarian cancer ([place]) 11/20/2016   Pelvic mass in female     Past Surgical History:  Procedure Laterality Date   APPENDECTOMY     LAPAROSCOPY N/A 03/14/2017   Procedure: LAPAROSCOPY OPERATIVE;  Surgeon: Mellody Drown, MD;  Location: [facility];  Service: Gynecology;  Laterality: N/A;   LAPAROTOMY N/A 03/14/2017    Procedure: LAPAROTOMY;  Surgeon: Mellody Drown, MD;  Location: [facility] OR[facility];  Service: Gynecology;  Laterality: N/A;   LYMPH NODE DISSECTION N/A 03/14/2017   Procedure: LYMPH NODE DISSECTION;  Surgeon: Mellody Drown, MD;  Location: [facility] OR[facility];  Service: Gynecology;  Laterality: N/A;   OMENTECTOMY N/A 03/14/2017   Procedure: OMENTECTOMY;  Surgeon: Mellody Drown, MD;  Location: [facility];  Service: Gynecology;  Laterality: N/A;   PORTA CATH INSERTION N/A 11/27/2016   Procedure: Glori Luis Cath Insertion;  Surgeon: Algernon Huxley, MD;  Location: [facility];  Service: Cardiovascular;  Laterality: N/A;    Current Medications: Current Meds  Medication Sig   aspirin 81 MG chewable tablet Chew 1 tablet (81 mg total) by mouth daily.   feeding supplement (ENSURE ENLIVE / ENSURE PLUS) LIQD Take 237 mLs by mouth 3 (three) times daily between meals.   furosemide (LASIX) 40 MG tablet Take one tablet daily (can take extra tab in afternoon if gain 3 lbs in a day or 5 lbs in a week)   levothyroxine (SYNTHROID) 100 MCG tablet Take 1 tablet (100 mcg total) by mouth daily.   lidocaine-prilocaine (EMLA) cream Apply 1 application topically as needed. Apply small amount to port site at least 1 hour prior to it being accessed, cover with plastic wrap   losartan (COZAAR) 100 MG tablet Take 1 tablet (100 mg total) by mouth daily.   metoprolol succinate (TOPROL XL) 25 MG 24  hr tablet Take 1 tablet (25 mg total) by mouth at bedtime.   ondansetron (ZOFRAN) 8 MG tablet Take 1 tablet (8 mg total) by mouth every 8 (eight) hours as needed for nausea or vomiting.   spironolactone (ALDACTONE) 25 MG tablet Take 1 tablet (25 mg total) by mouth daily.   [DISCONTINUED] hydrALAZINE (APRESOLINE) 25 MG tablet Take 1 tablet (25 mg total) by mouth 3 (three) times daily.     Allergies:   Omeprazole   Social History   Socioeconomic History   Marital status: Married    Spouse name: Not on file   Number of children: Not on file    Years of education: Not on file   Highest education level: Not on file  Occupational History   Not on file  Tobacco Use   Smoking status: Never   Smokeless tobacco: Never  Vaping Use   Vaping Use: Never used  Substance and Sexual Activity   Alcohol use: Not Currently   Drug use: No   Sexual activity: Yes    Birth control/protection: Post-menopausal  Other Topics Concern   Not on file  Social History Narrative   Lives locally with husband.  Was fairly active.   Social Determinants of Health   Financial Resource Strain: Low Risk  (04/21/2021)   Overall Financial Resource Strain (CARDIA)    Difficulty of Paying Living Expenses: Not hard at all  Food Insecurity: No Food Insecurity (01/22/2022)   Hunger Vital Sign    Worried About Running Out of Food in the Last Year: Never true    Ran Out of Food in the Last Year: Never true  Transportation Needs: No Transportation Needs (01/22/2022)   PRAPARE - Hydrologist (Medical): No    Lack of Transportation (Non-Medical): No  Physical Activity: Insufficiently Active (04/21/2021)   Exercise Vital Sign    Days of Exercise per Week: 6 days    Minutes of Exercise per Session: 20 min  Stress: No Stress Concern Present (04/21/2021)   [place]    Feeling of Stress : Not at all  Social Connections: Moderately Isolated (04/21/2021)   Social Connection and Isolation Panel [NHANES]    Frequency of Communication with Friends and Family: Three times a week    Frequency of Social Gatherings with Friends and Family: Twice a week    Attends Religious Services: Never    Marine scientist or Organizations: No    Attends Music therapist: Never    Marital Status: Married     Family History: The patient's family history includes Leukemia in her cousin; Throat cancer in her cousin and cousin.  ROS:   Please see the history of present  illness.     All other systems reviewed and are negative.  EKGs/Labs/Other Studies Reviewed:    The following studies were reviewed today:   EKG:  EKG is  ordered today.  The ekg ordered today demonstrates normal sinus rhythm, possible old septal infarct  Recent Labs: 12/30/2021: TSH 22.325 01/22/2022: Magnesium 1.8 02/03/2022: ALT 12; B Natriuretic Peptide 2,073.3; BUN 19; Creatinine, Ser 0.95; Hemoglobin 9.0; Platelets 127; Potassium 3.9; Sodium 133  Recent Lipid Panel No results found for: "CHOL", "TRIG", "HDL", "CHOLHDL", "VLDL", "LDLCALC", "LDLDIRECT"   Risk Assessment/Calculations:     HYPERTENSION CONTROL Vitals:   02/27/22 0838 02/27/22 0845  BP: (!) 150/82 (!) 150/84    The patient's blood pressure is elevated above target  today.  In order to address the patient's elevated BP: A current anti-hypertensive medication was adjusted today.            Physical Exam:    VS:  BP (!) 150/84 (BP Location: Right Arm)   Pulse 72   Ht '5\' 7"'$  (1.702 m)   Wt 111 lb 12.8 oz (50.7 kg)   SpO2 98%   BMI 17.51 kg/m     Wt Readings from Last 3 Encounters:  02/27/22 111 lb 12.8 oz (50.7 kg)  02/03/22 110 lb 1.6 oz (49.9 kg)  01/30/22 112 lb 8 oz (51 kg)     GEN:  Well nourished, well developed in no acute distress HEENT: Normal NECK: No JVD; No carotid bruits CARDIAC: RRR, no murmurs, rubs, gallops RESPIRATORY:  Clear to auscultation without rales, wheezing or rhonchi  ABDOMEN: Soft, non-tender, non-distended MUSCULOSKELETAL: Trace to 1+ edema; No deformity  SKIN: Warm and dry NEUROLOGIC:  Alert and oriented x 3 PSYCHIATRIC:  Normal affect   ASSESSMENT:    1. Primary hypertension   2. Chronic diastolic heart failure (HCC)    PLAN:    In order of problems listed above:  Hypertension, BP elevated, increase hydralazine to 50 mg 3 times daily.  Continue losartan 100 mg daily, Aldactone 25 mg daily. HFpEF, trace edema.  Continue Lasix, Aldactone.  EF 55%.  Edema  much improved, describes NYHA class II symptoms.  Follow-up in 6 to 8 weeks.     Medication Adjustments/Labs and Tests Ordered: Current medicines are reviewed at length with the patient today.  Concerns regarding medicines are outlined above.  Orders Placed This Encounter  Procedures   EKG 12-Lead   Meds ordered this encounter  Medications   hydrALAZINE (APRESOLINE) 50 MG tablet    Sig: Take 1 tablet (50 mg total) by mouth 3 (three) times daily.    Dispense:  90 tablet    Refill:  1    Patient Instructions  Medication Instructions:   Your physician has recommended you make the following change in your medication:    INCREASE your Hydralazine to 50 MG three times a day.   *If you need a refill on your cardiac medications before your next appointment, please call your pharmacy*     Follow-Up: At [facility], you and your health needs are our priority.  As part of our continuing mission to provide you with exceptional heart care, we have created designated Provider Care Teams.  These Care Teams include your primary Cardiologist (physician) and Advanced Practice Providers (APPs -  Physician Assistants and Nurse Practitioners) who all work together to provide you with the care you need, when you need it.  We recommend signing up for the patient portal called "MyChart".  Sign up information is provided on this After Visit Summary.  MyChart is used to connect with patients for Virtual Visits (Telemedicine).  Patients are able to view lab/test results, encounter notes, upcoming appointments, etc.  Non-urgent messages can be sent to your provider as well.   To learn more about what you can do with MyChart, go to [email or web address].    Your next appointment:   6-8 week(s)  The format for your next appointment:   In Person  Provider:   You may see Kate Sable, MD or one of the following Advanced Practice Providers on your designated Care Team:   Murray Hodgkins, NP Christell Faith, PA-C Cadence Kathlen Mody, PA-C Gerrie Nordmann, NP    Other Instructions   Important  Information About Sugar         Signed, Kate Sable, MD  02/27/2022 9:18 AM    [email or web address]

## 2022-03-10 ENCOUNTER — Inpatient Hospital Stay (HOSPITAL_BASED_OUTPATIENT_CLINIC_OR_DEPARTMENT_OTHER): Payer: Medicare Other | Admitting: Oncology

## 2022-03-10 ENCOUNTER — Inpatient Hospital Stay: Payer: Medicare Other

## 2022-03-10 ENCOUNTER — Inpatient Hospital Stay: Payer: Medicare Other | Attending: Oncology

## 2022-03-10 ENCOUNTER — Encounter: Payer: Self-pay | Admitting: Oncology

## 2022-03-10 VITALS — BP 168/80 | HR 65 | Temp 96.0°F | Resp 16 | Ht 67.0 in | Wt 109.0 lb

## 2022-03-10 DIAGNOSIS — I130 Hypertensive heart and chronic kidney disease with heart failure and stage 1 through stage 4 chronic kidney disease, or unspecified chronic kidney disease: Secondary | ICD-10-CM | POA: Insufficient documentation

## 2022-03-10 DIAGNOSIS — I5031 Acute diastolic (congestive) heart failure: Secondary | ICD-10-CM | POA: Diagnosis not present

## 2022-03-10 DIAGNOSIS — D539 Nutritional anemia, unspecified: Secondary | ICD-10-CM | POA: Diagnosis not present

## 2022-03-10 DIAGNOSIS — I10 Essential (primary) hypertension: Secondary | ICD-10-CM

## 2022-03-10 DIAGNOSIS — Z452 Encounter for adjustment and management of vascular access device: Secondary | ICD-10-CM | POA: Diagnosis not present

## 2022-03-10 DIAGNOSIS — C561 Malignant neoplasm of right ovary: Secondary | ICD-10-CM | POA: Insufficient documentation

## 2022-03-10 DIAGNOSIS — C569 Malignant neoplasm of unspecified ovary: Secondary | ICD-10-CM

## 2022-03-10 DIAGNOSIS — T451X5A Adverse effect of antineoplastic and immunosuppressive drugs, initial encounter: Secondary | ICD-10-CM

## 2022-03-10 DIAGNOSIS — D6481 Anemia due to antineoplastic chemotherapy: Secondary | ICD-10-CM | POA: Insufficient documentation

## 2022-03-10 DIAGNOSIS — I5033 Acute on chronic diastolic (congestive) heart failure: Secondary | ICD-10-CM | POA: Insufficient documentation

## 2022-03-10 DIAGNOSIS — N189 Chronic kidney disease, unspecified: Secondary | ICD-10-CM | POA: Insufficient documentation

## 2022-03-10 LAB — CBC WITH DIFFERENTIAL/PLATELET
Abs Immature Granulocytes: 0.01 10*3/uL (ref 0.00–0.07)
Basophils Absolute: 0 10*3/uL (ref 0.0–0.1)
Basophils Relative: 1 %
Eosinophils Absolute: 0 10*3/uL (ref 0.0–0.5)
Eosinophils Relative: 1 %
HCT: 25.4 % — ABNORMAL LOW (ref 36.0–46.0)
Hemoglobin: 8.1 g/dL — ABNORMAL LOW (ref 12.0–15.0)
Immature Granulocytes: 0 %
Lymphocytes Relative: 31 %
Lymphs Abs: 0.9 10*3/uL (ref 0.7–4.0)
MCH: 32.9 pg (ref 26.0–34.0)
MCHC: 31.9 g/dL (ref 30.0–36.0)
MCV: 103.3 fL — ABNORMAL HIGH (ref 80.0–100.0)
Monocytes Absolute: 0.3 10*3/uL (ref 0.1–1.0)
Monocytes Relative: 11 %
Neutro Abs: 1.6 10*3/uL — ABNORMAL LOW (ref 1.7–7.7)
Neutrophils Relative %: 56 %
Platelets: 136 10*3/uL — ABNORMAL LOW (ref 150–400)
RBC: 2.46 MIL/uL — ABNORMAL LOW (ref 3.87–5.11)
RDW: 13.2 % (ref 11.5–15.5)
WBC: 2.9 10*3/uL — ABNORMAL LOW (ref 4.0–10.5)
nRBC: 0 % (ref 0.0–0.2)

## 2022-03-10 LAB — COMPREHENSIVE METABOLIC PANEL
ALT: 18 U/L (ref 0–44)
AST: 28 U/L (ref 15–41)
Albumin: 4 g/dL (ref 3.5–5.0)
Alkaline Phosphatase: 56 U/L (ref 38–126)
Anion gap: 8 (ref 5–15)
BUN: 26 mg/dL — ABNORMAL HIGH (ref 8–23)
CO2: 22 mmol/L (ref 22–32)
Calcium: 9.4 mg/dL (ref 8.9–10.3)
Chloride: 102 mmol/L (ref 98–111)
Creatinine, Ser: 1.07 mg/dL — ABNORMAL HIGH (ref 0.44–1.00)
GFR, Estimated: 56 mL/min — ABNORMAL LOW (ref >60)
Glucose, Bld: 125 mg/dL — ABNORMAL HIGH (ref 70–99)
Potassium: 4 mmol/L (ref 3.5–5.1)
Sodium: 132 mmol/L — ABNORMAL LOW (ref 135–145)
Total Bilirubin: 0.6 mg/dL (ref 0.3–1.2)
Total Protein: 7.3 g/dL (ref 6.5–8.1)

## 2022-03-10 MED ORDER — HEPARIN SOD (PORK) LOCK FLUSH 100 UNIT/ML IV SOLN
500.0000 [IU] | Freq: Once | INTRAVENOUS | Status: AC
  Administered 2022-03-10: 500 [IU] via INTRAVENOUS
  Filled 2022-03-10: qty 5

## 2022-03-10 MED ORDER — SODIUM CHLORIDE 0.9% FLUSH
10.0000 mL | Freq: Once | INTRAVENOUS | Status: AC
  Administered 2022-03-10: 10 mL via INTRAVENOUS
  Filled 2022-03-10: qty 10

## 2022-03-10 NOTE — Progress Notes (Unsigned)
Patient here for oncology follow-up appointment, expresses no new concerns at this time.    

## 2022-03-10 NOTE — Assessment & Plan Note (Signed)
Labs reviewed and discussed with patient. Recent hospitalization medical records were reviewed.  Discontinue Doxorubicin due to acute CHF with preserved EF.  Repeat CA125 today Recommend to repeat CT chest abdomen pelvis w contrast for surveillance.  Folate receptor alpha positive

## 2022-03-11 NOTE — Assessment & Plan Note (Signed)
Better controlled, still need to be optimized.  Follow up with cardiology

## 2022-03-11 NOTE — Assessment & Plan Note (Signed)
Follow up with cardiology

## 2022-03-11 NOTE — Assessment & Plan Note (Addendum)
No significant improvement after holding chemotherapy.  Will check B12, Folate, iron tibc ferritin at next visit.  Recommend patient to start empiric B12 and iron supplementation.

## 2022-03-11 NOTE — Progress Notes (Signed)
Hematology/Oncology Progress note [phone] [phone]         Patient Care Team: Cletis Athens, MD as PCP - General (Internal Medicine) Kate Sable, MD as PCP - Cardiology (Cardiology) Clent Jacks, RN as Registered Nurse Theora Gianotti, Venida Jarvis, [name], [name], MD as Referring Physician (Obstetrics and Gynecology) Kate Sable, MD as Consulting Physician (Cardiology)  ASSESSMENT & PLAN:   Cancer Staging  Malignant neoplasm of ovary [facility]) Staging form: Ovary, Fallopian Tube, and Primary Peritoneal Carcinoma, AJCC 8th Edition - Clinical stage from 11/22/2016: Stage IIIC (cT3c, cN1b, cM0) - Signed by Earlie Server, MD on 11/22/2016   Malignant neoplasm of ovary [facility]) Labs reviewed and discussed with patient. Recent hospitalization medical records were reviewed.  Discontinue Doxorubicin due to acute CHF with preserved EF.  Repeat CA125 today CT chest abdomen pelvis w contrast showed stable disease.  Folate receptor alpha positive   Recommend continue observation, and in the future, when she progresses, consider Mirvetuximab   Anemia due to antineoplastic chemotherapy No significant improvement after holding chemotherapy.  Will check B12, Folate, iron tibc ferritin at next visit.  Recommend patient to start empiric B12 and iron supplementation.   Hypertension Better controlled, still need to be optimized.  Follow up with cardiology  CHF (congestive heart failure) ([place]) Follow up with cardiology   Orders Placed This Encounter  Procedures   CBC with Differential/Platelet    Standing Status:   Future    Standing Expiration Date:   03/10/2023   Comprehensive metabolic panel    Standing Status:   Future    Standing Expiration Date:   03/10/2023   CA 125    Standing Status:   Future    Standing Expiration Date:   03/10/2023   CA 125     Standing Status:   Future    Number of Occurrences:   1    Standing Expiration Date:   03/10/2023   Follow-up  Per LOS  All questions were answered. The patient knows to call the clinic with any problems, questions or concerns.  Earlie Server, MD, PhD [facility] Hematology Oncology 03/10/2022     CHIEF COMPLAINTS/PURPOSE OF Visit Follow up for chemotherapy for ovarian cancer.  HISTORY OF PRESENTING ILLNESS: Katie Hill 71 y.o. female presents for follow up of management of recurrent  ovarian cancer. Oncology History  Malignant neoplasm of ovary ([place])  09/20/2011 Imaging   CT chest abdomen pelvis w contrast 1. No significant interval changed compared with the previous exam.2. Stable appearance of soft tissue attenuating lesion in the posterolateral right hemipelvis. This may represent a treated site of peritoneal disease.3. Unchanged appearance of borderline enlarged left periaortic and aortocaval retroperitoneal lymph nodes.4. Unchanged 6 mm left lower lobe lung nodule. 5. No new signs of metastatic disease within the chest, abdomen or pelvis. 6. Aortic Atherosclerosis (ICD10-I70.0) and Emphysema    11/20/2016 Initial Diagnosis   Malignant neoplasm of ovary   Pathology 11/16/2016 Surgical Pathology  CASE: [id number]  PATIENT: Katie Hill  Surgical Pathology Report   SPECIMEN SUBMITTED:  A. Retroperitoneal adenopathy, left  DIAGNOSIS:  A. LYMPH NODE, LEFT RETROPERITONEAL; CT-GUIDED CORE BIOPSY:  - METASTATIC HIGH-GRADE SEROUS CARCINOMA.  Pathology 03/14/2017   DIAGNOSIS:  A. OMENTUM; OMENTECTOMY:  - NO TUMOR SEEN.  - ONE NEGATIVE LYMPH NODE (0/1).   B.  RIGHT FALLOPIAN TUBE AND OVARY; SALPINGO-OOPHORECTOMY:  - SMALL FOCI OF HIGH GRADE SEROUS CARCINOMA  INVOLVING THE OVARY.  - MARKED THERAPY RELATED CHANGE.  - NO TUMOR SEEN IN THE FALLOPIAN TUBE.   C.  UTERUS, CERVIX, LEFT FALLOPIAN TUBE AND OVARY; HYSTERECTOMY AND LEFT  SALPINGO-OOPHORECTOMY:  - NABOTHIAN  CYSTS.  - CYSTIC ATROPHY OF THE ENDOMETRIUM.  - SEROSAL ADHESIONS.  - UNREMARKABLE FALLOPIAN TUBE.  - OVARY SHOWING TREATMENT RELATED CHANGE.   D.  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    12/01/2016 -  Chemotherapy   Carboplatin and taxol x 4 neoadjuvant chemotherapy    03/14/2017 Surgery   Patient had debulking surgery on 03/14/2017. She had a laparoscopy with conversion to laparotomy, total hysterectomy, with bilateral salpingo oophorectomy, right aortic lymph node dissection, omentectomy. Pathology showed small foci of residual disease in ovary.   HRD positive, declined Olarparib maintenance trial    03/28/2017 -  Chemotherapy   Adjuvant carbo and taxol x3 cycles   02/28/2018 Progression   Local Recurrence. CA125 146 CT abdomen pelvis w contrast showed interval enlargement of an aortocaval lymph node which currently measures 1.4 cm in short axis (axial image 75 of series 2). This lymph node is in close proximity to the previously resected retroperitoneal lymphadenopathy and is very concerning for an additional nodal metastasis.    03/15/2018 -  Chemotherapy   03/15/2018-05/17/2018 Carboplatin and Taxol x 4.  CA125 decrease from 49.7 to 6 after 4 cycles of treatment.  05/17/2018-10/29/20   Olarparib 338m BID    11/05/2018 Imaging   MRI abdomenPelvis on 11/05/2018 showed Interval progression of, now measuring 2.7 x 2.3 cm and concerning for progression of metastatic disease.retrocaval lymph node in the abdomen Olaparib was held for short period of time. Her CA125 trended down again.  Dr. BFransisca Connorsrecommending resume olaparib and continue monitor.   10/06/2020 Progression    CT chest abdomen pelvis with contrast showed new lymph nodes in the prevascular space of the upper anterior mediastinum most consistent with metastasis.  Single new right lower lobe pulmonary nodule is concerning for early pulmonary  metastasis.  Interval enlargement of the left periaortic retroperitoneal lymph node.  Stable adjacent aorto caval adenopathy.    Genetic Testing   Genetic testing negative for 83 genes on [facility]'s Multi-Cancer panel (ALK, APC, ATM, AXIN2, BAP1, BARD1, BLM, BMPR1A, BRCA1, BRCA2, BRIP1, CASR, CDC73, CDH1, CDK4, CDKN1B, CDKN1C, CDKN2A, CEBPA, CHEK2, CTNNA1, DICER1, DIS3L2, EGFR, EPCAM, FH, FLCN, GATA2, GPC3, GREM1, HOXB13, HRAS, KIT, MAX, MEN1, MET, MITF, MLH1, MSH2, MSH3, MSH6, MUTYH, NBN, NF1, NF2, NTHL1, PALB2, PDGFRA, PHOX2B, PMS2, POLD1, POLE, POT1, PRKAR1A, PTCH1, PTEN, RAD50, RAD51C, RAD51D, RB1, RECQL4, RET, RUNX1, SDHA, SDHAF2, SDHB, SDHC, SDHD, SMAD4, SMARCA4, SMARCB1, SMARCE1, STK11, SUFU, TERC, TERT, TMEM127, TP53, TSC1, TSC2, VHL, WRN, WT1).   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  Myraid testing negative for somatic BRACA1/2, positive for HRD.    11/03/2020 -  Chemotherapy   Liposomal Doxorubicin + Bevacizumab q28d   11/03/20 -05/20/21 Doxil + Bevacizumab q28d  06/03/21 Doxil only due to proteinuria.  07/01/21-07/15/21  Resumed on Doxil + Bevacizumab 07/29/21 Doxil only due to proteinuria.    10/14/2021 Echocardiogram    Left ventricular ejection fraction, by estimation, is 55 to 60%   12/16/2021 Imaging   UKorealower extremities 1. No evidence of deep venous thrombosis in either lower extremity. 2. 1.9 cm left popliteal fossa Baker's cyst.   12/23/2021 Echocardiogram   LVEF 55%   12/26/2021 Imaging   CT chest abdomen  pelvis w contrast   Chest Impression:   1. New RIGHT pleural effusion and new mild nodularity along the RIGHT oblique fissure. Recommend close attention on follow-up for potential pleural metastasis in the RIGHT hemithorax. 2. Stable LEFT lobe pulmonary nodule.3. No mediastinal lymphadenopathy.   Abdomen / Pelvis Impression:   1. Stable soft tissue lesion in the deep RIGHT pelvis. 2. Mass stable small periaortic lymph nodes. 3. No  intraperitoneal free fluid the abdomen or pelvis.   01/20/2022 - 01/23/2022 Hospital Admission   Hospitalized due to acute on chronic diastolic CHF, hypertension urgency, She was seen by cardiology and nephrology. BNP was significantly elevated at 4500 She was discharged home with Losartan, Aldactone, Lasix, beta blocker and hydralazine.    02/21/2022 Imaging   CT chest abdomen pelvis w contrast Stable small soft tissue lesion in the right perirectal lesion. Stable mild retroperitoneal and retrocrural lymphadenopathy.Stable 6 mm left lower lobe pulmonary nodule.No new or progressive disease within the chest, abdomen, or pelvis   High grade ovarian cancer [facility]    INTERVAL HISTORY BREANNA SHORKEY is a 71 y.o. female who has above history reviewed by me today presents for follow up visit for recurrent Ovarian cancer Hospitalized for hypertension emergency, CHF was seen by cardiology.  Today she feels better, BP is 168/80.HR 65 No chest pain, sob, nausea vomiting.  Still has leg swelling, improved.  Recent life stressor: her husband has been recently diagnosed with dementia, and currently admitted in psych unit.   . Review of Systems  Constitutional:  Positive for fatigue. Negative for appetite change, chills and fever.  HENT:   Negative for hearing loss and voice change.   Eyes:  Negative for eye problems.  Respiratory:  Negative for chest tightness and cough.   Cardiovascular:  Positive for leg swelling. Negative for chest pain.  Gastrointestinal:  Negative for abdominal distention, abdominal pain and blood in stool.  Endocrine: Negative for hot flashes.  Genitourinary:  Negative for difficulty urinating and frequency.   Musculoskeletal:  Negative for arthralgias.  Skin:  Negative for itching and rash.  Neurological:  Negative for extremity weakness.  Hematological:  Negative for adenopathy.  Psychiatric/Behavioral:  Negative for confusion.      [name] MEDICAL HISTORY: Past  Medical History:  Diagnosis Date   (HFpEF) heart failure with preserved ejection fraction ([place])    a. 12/2021 Echo: EF 55%, no rwma, nl RV fxn, mild MR, mild-mod TR. Triv AI.   Anemia    CHF (congestive heart failure) ([facility])    Chronic kidney disease    Dysrhythmia    Essential hypertension    Genetic testing 03/28/2017   Multi-Cancer panel (83 genes) @ [facility] - No pathogenic mutations detected   High grade ovarian cancer ([place]) 11/20/2016   Pelvic mass in female     SURGICAL HISTORY: Past Surgical History:  Procedure Laterality Date   APPENDECTOMY     LAPAROSCOPY N/A 03/14/2017   Procedure: LAPAROSCOPY OPERATIVE;  Surgeon: Mellody Drown, MD;  Location: [facility] ORS;  Service: Gynecology;  Laterality: N/A;   LAPAROTOMY N/A 03/14/2017   Procedure: LAPAROTOMY;  Surgeon: Mellody Drown, MD;  Location: [facility] OR[facility];  Service: Gynecology;  Laterality: N/A;   LYMPH NODE DISSECTION N/A 03/14/2017   Procedure: LYMPH NODE DISSECTION;  Surgeon: Mellody Drown, MD;  Location: [facility] ORS;  Service: Gynecology;  Laterality: N/A;   OMENTECTOMY N/A 03/14/2017   Procedure: OMENTECTOMY;  Surgeon: Mellody Drown, MD;  Location: [facility] OR[facility];  Service: Gynecology;  Laterality: N/A;  PORTA CATH INSERTION N/A 11/27/2016   Procedure: Glori Luis Cath Insertion;  Surgeon: Algernon Huxley, MD;  Location: [place] [facility];  Service: Cardiovascular;  Laterality: N/A;    SOCIAL HISTORY: Social History   Tobacco Use   Smoking status: Never   Smokeless tobacco: Never  Vaping Use   Vaping Use: Never used  Substance Use Topics   Alcohol use: Not Currently   Drug use: No     FAMILY HISTORY Family History  Problem Relation Age of Onset   Throat cancer Cousin    Throat cancer Cousin    Leukemia Cousin     ALLERGIES:  is allergic to omeprazole.  MEDICATIONS:  Current Outpatient Medications  Medication Sig Dispense Refill   aspirin 81 MG chewable tablet Chew 1 tablet (81 mg total) by mouth daily. 30 tablet 0    feeding supplement (ENSURE ENLIVE / ENSURE PLUS) LIQD Take 237 mLs by mouth 3 (three) times daily between meals. 14220 mL 0   furosemide (LASIX) 40 MG tablet Take one tablet daily (can take extra tab in afternoon if gain 3 lbs in a day or 5 lbs in a week) 90 tablet 3   hydrALAZINE (APRESOLINE) 50 MG tablet Take 1 tablet (50 mg total) by mouth 3 (three) times daily. 90 tablet 1   levothyroxine (SYNTHROID) 100 MCG tablet Take 1 tablet (100 mcg total) by mouth daily. 90 tablet 3   lidocaine-prilocaine (EMLA) cream Apply 1 application topically as needed. Apply small amount to port site at least 1 hour prior to it being accessed, cover with plastic wrap 30 g 1   losartan (COZAAR) 100 MG tablet Take 1 tablet (100 mg total) by mouth daily. 90 tablet 3   metoprolol succinate (TOPROL XL) 25 MG 24 hr tablet Take 1 tablet (25 mg total) by mouth at bedtime. 90 tablet 3   ondansetron (ZOFRAN) 8 MG tablet Take 1 tablet (8 mg total) by mouth every 8 (eight) hours as needed for nausea or vomiting. 60 tablet 1   spironolactone (ALDACTONE) 25 MG tablet Take 1 tablet (25 mg total) by mouth daily. 90 tablet 3   metoprolol tartrate (LOPRESSOR) 25 MG tablet Take by mouth. (Patient not taking: Reported on 02/27/2022)     Multiple Vitamin (MULTIVITAMIN WITH MINERALS) TABS tablet Take 1 tablet by mouth daily. (Patient not taking: Reported on 01/30/2022)     No current facility-administered medications for this visit.   Facility-Administered Medications Ordered in Other Visits  Medication Dose Route Frequency Provider Last Rate Last Admin   pegfilgrastim (NEULASTA ONPRO KIT) 6 MG/0.6ML injection             PHYSICAL EXAMINATION:  ECOG PERFORMANCE STATUS: 0 - Asymptomatic Vitals:   03/10/22 0934  BP: (!) 168/80  Pulse: 65  Resp: 16  Temp: (!) 96 F (35.6 C)  SpO2: 100%     Filed Weights   03/10/22 0934  Weight: 49.4 kg      Physical Exam Constitutional:      General: She is not in acute  distress.    Appearance: She is not diaphoretic.     Comments: Thin built, she walks independantly  HENT:     Head: Normocephalic and atraumatic.     Nose: Nose normal.     Mouth/Throat:     Pharynx: No oropharyngeal exudate.  Eyes:     General: No scleral icterus.    Pupils: Pupils are equal, round, and reactive to light.  Neck:  Vascular: No JVD.  Cardiovascular:     Rate and Rhythm: Normal rate and regular rhythm.     Heart sounds: No murmur heard. Pulmonary:     Effort: Pulmonary effort is normal. No respiratory distress.     Breath sounds: Normal breath sounds. No rales.  Chest:     Chest wall: No tenderness.  Abdominal:     General: Bowel sounds are normal. There is no distension.     Palpations: Abdomen is soft.     Tenderness: There is no abdominal tenderness.  Musculoskeletal:        General: Normal range of motion.     Cervical back: Normal range of motion and neck supple.     Right lower leg: Edema present.     Left lower leg: Edema present.  Lymphadenopathy:     Cervical: No cervical adenopathy.  Skin:    General: Skin is warm and dry.     Findings: No erythema or rash.     Comments: Right anterior medi port +   Neurological:     Mental Status: She is alert and oriented to person, place, and time.     Cranial Nerves: No cranial nerve deficit.     Motor: No abnormal muscle tone.     Coordination: Coordination normal.  Psychiatric:        Mood and Affect: Affect normal.        Judgment: Judgment normal.        LABORATORY DATA: I have personally reviewed the data as listed:     Latest Ref Rng & Units 03/10/2022    9:02 AM 02/03/2022    8:12 AM 01/23/2022    5:20 AM  CBC  WBC 4.0 - 10.5 K/uL 2.9  3.3  3.8   Hemoglobin 12.0 - 15.0 g/dL 8.1  9.0  10.0   Hematocrit 36.0 - 46.0 % 25.4  27.5  30.9   Platelets 150 - 400 K/uL 136  127  145       Latest Ref Rng & Units 03/10/2022    9:02 AM 02/03/2022    8:12 AM 01/23/2022    5:20 AM  CMP  Glucose  70 - 99 mg/dL 125  115  88   BUN 8 - 23 mg/dL _0 Creatinine 0.44 - 1.00 mg/dL 1.07  0.95  1.17   Sodium 135 - 145 mmol/L 132  133  138   Potassium 3.5 - 5.1 mmol/L 4.0  3.9  4.1   Chloride 98 - 111 mmol/L 102  105  107   CO2 22 - 32 mmol/L _1 Calcium 8.9 - 10.3 mg/dL 9.4  9.2  9.3   Total Protein 6.5 - 8.1 g/dL 7.3  7.0    Total Bilirubin 0.3 - 1.2 mg/dL 0.6  0.5    Alkaline Phos 38 - 126 U/L 56  53    AST 15 - 41 U/L 28  20    ALT 0 - 44 U/L 18  12       RADIOGRAPHIC STUDIES: I have personally reviewed the radiological images as listed and agreed with the findings in the report. CT CHEST ABDOMEN PELVIS W CONTRAST  Result Date: 02/22/2022 CLINICAL DATA:  Follow-up recurrent ovarian carcinoma. On oral chemotherapy. * Tracking Code: BO * EXAM: CT CHEST, ABDOMEN, AND PELVIS WITH CONTRAST TECHNIQUE: Multidetector CT imaging of the chest, abdomen and pelvis was performed following the standard protocol during bolus  administration of intravenous contrast. RADIATION DOSE REDUCTION: This exam was performed according to the departmental dose-optimization program which includes automated exposure control, adjustment of the mA and/or kV according to patient size and/or use of iterative reconstruction technique. CONTRAST:  59m OMNIPAQUE IOHEXOL 300 MG/ML  SOLN COMPARISON:  12/26/2021 FINDINGS: CT CHEST FINDINGS Cardiovascular: No acute findings.  Stable cardiomegaly. Mediastinum/Lymph Nodes: No masses or pathologically enlarged lymph nodes identified. Lungs/Pleura: Tiny right pleural effusion has decreased in size since previous study. Stable mild central bronchiectasis noted bilaterally. 6 mm well-circumscribed pulmonary nodule in the posterior left lower lobe is stable. No new or suspicious pulmonary nodules identified. Thickening of interlobular septi in both lung bases shows decrease since prior exam. Musculoskeletal:  No suspicious bone lesions identified. CT ABDOMEN AND PELVIS  FINDINGS Hepatobiliary: No masses identified. Gallbladder is unremarkable. No evidence of biliary ductal dilatation. Pancreas:  No mass or inflammatory changes. Spleen:  Within normal limits in size and appearance. Adrenals/Urinary tract: No suspicious masses or hydronephrosis. Stomach/Bowel: No evidence of obstruction, inflammatory process, or abnormal fluid collections. Vascular/Lymphatic: Mild lymphadenopathy in retrocrural, left para-aortic, and aortocaval spaces shows no significant change, with index lymph node in the left para-aortic region measuring 11 mm on image 67/2. No pelvic lymphadenopathy identified. No acute vascular findings. Reproductive: Prior hysterectomy again noted. Soft tissue density in the posterior pelvis in the right perirectal region measuring 2.2 x 1.5 cm on image 107/2 shows no significant change since prior study. No other soft tissue masses or ascites. Other:  None. Musculoskeletal:  No suspicious bone lesions identified. IMPRESSION: Stable small soft tissue lesion in the right perirectal lesion. Stable mild retroperitoneal and retrocrural lymphadenopathy. Stable 6 mm left lower lobe pulmonary nodule. No new or progressive disease within the chest, abdomen, or pelvis. Electronically Signed   By: JMarlaine HindM.D.   On: 02/22/2022 10:31   [place]NAL  Result Date: 01/21/2022 CLINICAL DATA:  Edema. EXAM: RENAL / URINARY TRACT ULTRASOUND COMPLETE COMPARISON:  None Available. FINDINGS: Right Kidney: Renal measurements: 11.9 x 4.7 x 5.4 cm = volume: 157.2 mL. Echogenicity within normal limits. No mass or hydronephrosis visualized. Left Kidney: Renal measurements: 11.4 x 4.4 x 5.4 cm = volume: 144.2 mL. Echogenicity within normal limits. No mass or hydronephrosis visualized. Bladder: Appears normal for degree of bladder distention. Other: Bilateral pleural effusions. IMPRESSION: Normal sonographic appearance of the kidneys.  Normal bladder. Electronically Signed   By: DLajean ManesM.D.    On: 01/21/2022 13:06   CT Head Wo Contrast  Result Date: 01/20/2022 CLINICAL DATA:  Headache, sudden, severe EXAM: CT HEAD WITHOUT CONTRAST TECHNIQUE: Contiguous axial images were obtained from the base of the skull through the vertex without intravenous contrast. RADIATION DOSE REDUCTION: This exam was performed according to the departmental dose-optimization program which includes automated exposure control, adjustment of the mA and/or kV according to patient size and/or use of iterative reconstruction technique. COMPARISON:  CT head January 13, 2022. FINDINGS: Brain: No evidence of acute infarction, hemorrhage, hydrocephalus, extra-axial collection or mass lesion/mass effect. Vascular: No hyperdense vessel. Skull: No acute fracture. Sinuses/Orbits: Clear sinuses.  No acute orbital findings. Other: No mastoid effusions IMPRESSION: No evidence of acute intracranial abnormality. Electronically Signed   By: FMargaretha SheffieldM.D.   On: 01/20/2022 16:37   DG Chest 2 View  Result Date: 01/20/2022 CLINICAL DATA:  Hypertension EXAM: CHEST - 2 VIEW COMPARISON:  CT 12/26/2021 FINDINGS: Right-sided central venous port tip over the cavoatrial region. Small bilateral pleural effusions with airspace  disease at both bases. Partially obscured cardiomediastinal silhouette. No pneumothorax. IMPRESSION: Small bilateral pleural effusions with basilar atelectasis or pneumonia Electronically Signed   By: Donavan Foil M.D.   On: 01/20/2022 16:35   CT HEAD WO CONTRAST (5MM)  Result Date: 01/13/2022 CLINICAL DATA:  Headaches EXAM: CT HEAD WITHOUT CONTRAST TECHNIQUE: Contiguous axial images were obtained from the base of the skull through the vertex without intravenous contrast. RADIATION DOSE REDUCTION: This exam was performed according to the departmental dose-optimization program which includes automated exposure control, adjustment of the mA and/or kV according to patient size and/or use of iterative reconstruction  technique. COMPARISON:  None Available. FINDINGS: Brain: No evidence of acute infarction, hemorrhage, hydrocephalus, extra-axial collection or mass lesion/mass effect. Vascular: No hyperdense vessel or unexpected calcification. Skull: No osseous abnormality. Sinuses/Orbits: Visualized paranasal sinuses are clear. Visualized mastoid sinuses are clear. Visualized orbits demonstrate no focal abnormality. Other: None IMPRESSION: No acute intracranial findings. Electronically Signed   By: Kathreen Devoid M.D.   On: 01/13/2022 10:36   CT CHEST ABDOMEN PELVIS W CONTRAST  Result Date: 12/27/2021 CLINICAL DATA:  Ovarian cancer. Leg swelling. Hysterectomy 2018. Chemotherapy ongoing. * Tracking Code: BO * EXAM: CT CHEST, ABDOMEN, AND PELVIS WITH CONTRAST TECHNIQUE: Multidetector CT imaging of the chest, abdomen and pelvis was performed following the standard protocol during bolus administration of intravenous contrast. RADIATION DOSE REDUCTION: This exam was performed according to the departmental dose-optimization program which includes automated exposure control, adjustment of the mA and/or kV according to patient size and/or use of iterative reconstruction technique. CONTRAST:  146m OMNIPAQUE IOHEXOL 300 MG/ML  SOLN COMPARISON:  CT 09/19/2021 CT CHEST FINDINGS Cardiovascular: Port in the anterior chest wall with tip in distal SVC. No significant vascular findings. Normal heart size. No pericardial effusion. Mediastinum/Nodes: No axillary or supraclavicular adenopathy. No mediastinal or hilar adenopathy. No pericardial fluid. Esophagus normal. Lungs/Pleura: New moderate size layering RIGHT pleural effusion. LEFT lobe pulmonary nodule measures 6 mm (image 99/4) is unchanged. New nodule along the RIGHT oblique fissure measures 4 mm (image 61/4). Subtle pleural thickening more medially along the RIGHT oblique fissure is more prominent on image 75/4. Musculoskeletal: No aggressive osseous lesion. CT ABDOMEN AND PELVIS FINDINGS  Hepatobiliary: No focal hepatic lesion. No biliary ductal dilatation. Gallbladder is normal. Common bile duct is normal. Pancreas: Pancreas is normal. No ductal dilatation. No pancreatic inflammation. Spleen: Normal spleen Adrenals/urinary tract: Adrenal glands and kidneys are normal. The ureters and bladder normal. Stomach/Bowel: Stomach, small bowel, appendix, and cecum are normal. The colon and rectosigmoid colon are normal. Vascular/Lymphatic: Abdominal aorta is normal caliber. Small periaortic lymph nodes again noted. Largest focus lymphoid tissue measures 10 mm on image 69/2 not changed from prior. No new lymphadenopathy abdomen pelvis. Reproductive: Post hysterectomy anatomy. Other: soft tissue lesion in the posterior RIGHT pelvis anterior to the sacrum measures 14 x 29 mm (image 109/2) compared to 14 mm x 30 mm. Visually lesion looks similar. No new peritoneal implants Musculoskeletal: No aggressive osseous lesion. IMPRESSION: Chest Impression: 1. New RIGHT pleural effusion and new mild nodularity along the RIGHT oblique fissure. Recommend close attention on follow-up for potential pleural metastasis in the RIGHT hemithorax. 2. Stable LEFT lobe pulmonary nodule. 3. No mediastinal lymphadenopathy. Abdomen / Pelvis Impression: 1. Stable soft tissue lesion in the deep RIGHT pelvis. 2. Mass stable small periaortic lymph nodes. 3. No intraperitoneal free fluid the abdomen or pelvis. Electronically Signed   By: SSuzy BouchardM.D.   On: 12/27/2021 10:11   ECHOCARDIOGRAM  COMPLETE  Result Date: 12/23/2021    ECHOCARDIOGRAM REPORT   Patient Name:   Katie Hill Date of Exam: 12/23/2021 Medical Rec #:  [id number]        Height:       70.0 in Accession #:    [id number]       Weight:       116.8 lb Date of Birth:  1951-01-13        BSA:          1.661 m Patient Age:    75 years         BP:           170/83 mmHg Patient Gender: F                HR:           79 bpm. Exam Location:  [facility] Procedure: 2D Echo,  Cardiac Doppler, Color Doppler and Strain Analysis Indications:     Z51.81 Z79.899                  Encounter for monitoring cardiotoxic drug therapy  History:         Patient has prior history of Echocardiogram examinations, most                  recent 10/14/2021. Risk Factors:Hypertension.  Sonographer:     Sherrie Sport Referring Phys:  Earlie Server Diagnosing Phys: Ida Rogue MD  Sonographer Comments: Global longitudinal strain was attempted. IMPRESSIONS  1. Left ventricular ejection fraction, by estimation, is 55%. The left ventricle has normal function. The left ventricle has no regional wall motion abnormalities. The average left ventricular global longitudinal strain is -13.5 %.  2. Right ventricular systolic function is normal. The right ventricular size is normal.  3. The mitral valve is normal in structure. Mild mitral valve regurgitation. No evidence of mitral stenosis.  4. Tricuspid valve regurgitation is mild to moderate.  5. The aortic valve is tricuspid. Aortic valve regurgitation is trivial. No aortic stenosis is present.  6. The inferior vena cava is normal in size with greater than 50% respiratory variability, suggesting right atrial pressure of 3 mmHg. FINDINGS  Left Ventricle: Left ventricular ejection fraction, by estimation, is 55%. The left ventricle has normal function. The left ventricle has no regional wall motion abnormalities. The average left ventricular global longitudinal strain is -13.5 %. The left  ventricular internal cavity size was normal in size. There is no left ventricular hypertrophy. Left ventricular diastolic parameters are indeterminate. Right Ventricle: The right ventricular size is normal. No increase in right ventricular wall thickness. Right ventricular systolic function is normal. Left Atrium: Left atrial size was normal in size. Right Atrium: Right atrial size was normal in size. Pericardium: There is no evidence of pericardial effusion. Mitral Valve: The mitral valve is  normal in structure. Mild mitral valve regurgitation. No evidence of mitral valve stenosis. Tricuspid Valve: The tricuspid valve is normal in structure. Tricuspid valve regurgitation is mild to moderate. No evidence of tricuspid stenosis. Aortic Valve: The aortic valve is tricuspid. Aortic valve regurgitation is trivial. No aortic stenosis is present. Aortic valve mean gradient measures 2.0 mmHg. Aortic valve peak gradient measures 4.3 mmHg. Aortic valve area, by VTI measures 2.38 cm. Pulmonic Valve: The pulmonic valve was normal in structure. Pulmonic valve regurgitation is not visualized. No evidence of pulmonic stenosis. Aorta: The aortic root is normal in size and structure. Venous: The inferior vena cava is normal in  size with greater than 50% respiratory variability, suggesting right atrial pressure of 3 mmHg. IAS/Shunts: No atrial level shunt detected by color flow Doppler.  LEFT VENTRICLE PLAX 2D LVIDd:         5.00 cm LVIDs:         3.75 cm   2D Longitudinal Strain LV PW:         1.00 cm   2D Strain GLS Avg:     -13.5 % LV IVS:        0.90 cm LVOT diam:     2.10 cm LV SV:         48 LV SV Index:   29 LVOT Area:     3.46 cm  RIGHT VENTRICLE RV Basal diam:  3.60 cm RV Mid diam:    3.20 cm RV S prime:     13.60 cm/s TAPSE (M-mode): 2.8 cm LEFT ATRIUM             Index        RIGHT ATRIUM           Index LA diam:        3.50 cm 2.11 cm/m   RA Area:     22.90 cm LA Vol (A2C):   38.8 ml 23.36 ml/m  RA Volume:   74.30 ml  44.74 ml/m LA Vol (A4C):   34.4 ml 20.71 ml/m LA Biplane Vol: 37.1 ml 22.34 ml/m  AORTIC VALVE AV Area (Vmax):    2.32 cm AV Area (Vmean):   2.24 cm AV Area (VTI):     2.38 cm AV Vmax:           104.00 cm/s AV Vmean:          70.600 cm/s AV VTI:            0.204 m AV Peak Grad:      4.3 mmHg AV Mean Grad:      2.0 mmHg LVOT Vmax:         69.70 cm/s LVOT Vmean:        45.600 cm/s LVOT VTI:          0.140 m LVOT/AV VTI ratio: 0.69  AORTA Ao Root diam: 3.25 cm MITRAL VALVE                TRICUSPID VALVE MV Area (PHT): 4.10 cm    TR Peak grad:   21.0 mmHg MV Decel Time: 185 msec    TR Vmax:        229.00 cm/s MV E velocity: 83.30 cm/s MV A velocity: 53.50 cm/s  SHUNTS MV E/A ratio:  1.56        Systemic VTI:  0.14 m                            Systemic Diam: 2.10 cm Ida Rogue MD Electronically signed by Ida Rogue MD Signature Date/Time: 12/23/2021/10:04:27 AM    Final    US Venous Img Lower Bilateral  Result Date: 12/16/2021 CLINICAL DATA:  Bilateral leg swelling. EXAM: BILATERAL LOWER EXTREMITY VENOUS DOPPLER ULTRASOUND TECHNIQUE: Gray-scale sonography with compression, as well as color and duplex ultrasound, were performed to evaluate the deep venous system(s) from the level of the common femoral vein through the popliteal and proximal calf veins. COMPARISON:  None Available. FINDINGS: VENOUS Normal compressibility of the common femoral, superficial femoral, and popliteal veins, as well as the visualized calf veins. Visualized portions of  profunda femoral vein and great saphenous vein unremarkable. No filling defects to suggest DVT on grayscale or color Doppler imaging. Doppler waveforms show normal direction of venous flow, normal respiratory plasticity and response to augmentation. OTHER 1.9 x 0.8 x 0.6 cm fluid collection left popliteal fossa suggest Baker's cyst. Limitations: none IMPRESSION: 1. No evidence of deep venous thrombosis in either lower extremity. 2. 1.9 cm left popliteal fossa Baker's cyst. Electronically Signed   By: Misty Stanley M.D.   On: 12/16/2021 11:48

## 2022-03-13 ENCOUNTER — Telehealth: Payer: Self-pay

## 2022-03-13 NOTE — Telephone Encounter (Signed)
Pt informed and verbalized understanding

## 2022-03-13 NOTE — Telephone Encounter (Signed)
-----   Message from Earlie Server, MD sent at 03/12/2022  3:31 PM EST ----- Please recommend patient to take otc iron supplementation - ferrous sulfate '325mg'$  daily, and also otc B12 supplementation.

## 2022-03-21 LAB — CA 125: Cancer Antigen (CA) 125: 42.6 U/mL — ABNORMAL HIGH (ref 0.0–38.1)

## 2022-03-28 ENCOUNTER — Encounter: Payer: Self-pay | Admitting: Family

## 2022-03-28 ENCOUNTER — Ambulatory Visit: Payer: Medicare Other | Attending: Family | Admitting: Family

## 2022-03-28 VITALS — BP 178/84 | HR 60 | Resp 16 | Wt 114.4 lb

## 2022-03-28 DIAGNOSIS — D6481 Anemia due to antineoplastic chemotherapy: Secondary | ICD-10-CM

## 2022-03-28 DIAGNOSIS — I6523 Occlusion and stenosis of bilateral carotid arteries: Secondary | ICD-10-CM | POA: Diagnosis not present

## 2022-03-28 DIAGNOSIS — T451X5A Adverse effect of antineoplastic and immunosuppressive drugs, initial encounter: Secondary | ICD-10-CM | POA: Diagnosis not present

## 2022-03-28 DIAGNOSIS — I890 Lymphedema, not elsewhere classified: Secondary | ICD-10-CM | POA: Insufficient documentation

## 2022-03-28 DIAGNOSIS — I130 Hypertensive heart and chronic kidney disease with heart failure and stage 1 through stage 4 chronic kidney disease, or unspecified chronic kidney disease: Secondary | ICD-10-CM | POA: Insufficient documentation

## 2022-03-28 DIAGNOSIS — I10 Essential (primary) hypertension: Secondary | ICD-10-CM

## 2022-03-28 DIAGNOSIS — N189 Chronic kidney disease, unspecified: Secondary | ICD-10-CM | POA: Diagnosis not present

## 2022-03-28 DIAGNOSIS — D649 Anemia, unspecified: Secondary | ICD-10-CM | POA: Diagnosis not present

## 2022-03-28 DIAGNOSIS — C569 Malignant neoplasm of unspecified ovary: Secondary | ICD-10-CM | POA: Diagnosis not present

## 2022-03-28 DIAGNOSIS — I5032 Chronic diastolic (congestive) heart failure: Secondary | ICD-10-CM | POA: Diagnosis not present

## 2022-03-28 MED ORDER — EMPAGLIFLOZIN 10 MG PO TABS: 10.0000 mg | ORAL_TABLET | Freq: Every day | ORAL | 3 refills | Status: DC

## 2022-03-28 NOTE — Patient Instructions (Addendum)
Continue weighing daily and call for an overnight weight gain of 3 pounds or more or a weekly weight gain of more than 5 pounds.   If you have voicemail, please make sure your mailbox is cleaned out so that we may leave a message and please make sure to listen to any voicemails.     I will send referral for compression boots and they will call you.    We will start jardiance as 1 tablet every day.

## 2022-03-28 NOTE — Progress Notes (Signed)
Patient ID: Katie Hill, female    DOB: 1950-10-16, 71 y.o.   MRN: [id number]  HPI  Katie Hill is a 72 y/o female with a history of ovarian cancer, HTN, CKD, anemia and chronic heart failure.   Echo report from 12/23/21 reviewed and showed an EF of 55% along with mild MR and mild/moderate  Admitted 01/20/22 due to acute diastolic congestive heart failure, bilateral effusions and acute hypoxic respiratory failure. Treated with IV lasix with transition to oral diuretics. Cardiology and nephrology consults obtained. Antihypertensive meds added. Weaned off of oxygen. Carvedilol held for transient bradycardia but then resumed by cardiology. Hypokalemia corrected. Renal ultrasound negative. Discharged after 3 days.   She presents today for a follow-up visit with a chief complaint of minimal fatigue upon moderate exertion. Describes this as chronic in nature. She has associated cough, pedal edema, headaches & leg pain due to the swelling. She denies any difficulty sleeping, abdominal distention, palpitations, chest pain, wheezing, shortness of breath, dizziness or weight gain.   Continues to add "some" salt but says that she's trying to use less. Admits to really "loving" salt. Drinks 2 cups of coffee and 3 cups water daily.   Checks her BP at home and says that the top number is 140-150's.   Past Medical History:  Diagnosis Date   (HFpEF) heart failure with preserved ejection fraction ([facility])    a. 12/2021 Echo: EF 55%, no rwma, nl RV fxn, mild MR, mild-mod TR. Triv AI.   Anemia    CHF (congestive heart failure) ([facility])    Chronic kidney disease    Dysrhythmia    Essential hypertension    Genetic testing 03/28/2017   Multi-Cancer panel (83 genes) @ [facility] - No pathogenic mutations detected   High grade ovarian cancer ([place]) 11/20/2016   Pelvic mass in female    Past Surgical History:  Procedure Laterality Date   APPENDECTOMY     LAPAROSCOPY N/A 03/14/2017   Procedure: LAPAROSCOPY OPERATIVE;   Surgeon: Mellody Drown, MD;  Location: [facility];  Service: Gynecology;  Laterality: N/A;   LAPAROTOMY N/A 03/14/2017   Procedure: LAPAROTOMY;  Surgeon: Mellody Drown, MD;  Location: [facility];  Service: Gynecology;  Laterality: N/A;   LYMPH NODE DISSECTION N/A 03/14/2017   Procedure: LYMPH NODE DISSECTION;  Surgeon: Mellody Drown, MD;  Location: [facility] OR[facility];  Service: Gynecology;  Laterality: N/A;   OMENTECTOMY N/A 03/14/2017   Procedure: OMENTECTOMY;  Surgeon: Mellody Drown, MD;  Location: [facility] OR[facility];  Service: Gynecology;  Laterality: N/A;   PORTA CATH INSERTION N/A 11/27/2016   Procedure: Glori Luis Cath Insertion;  Surgeon: Algernon Huxley, MD;  Location: [facility];  Service: Cardiovascular;  Laterality: N/A;   Family History  Problem Relation Age of Onset   Throat cancer Cousin    Throat cancer Cousin    Leukemia Cousin    Social History   Tobacco Use   Smoking status: Never   Smokeless tobacco: Never  Substance Use Topics   Alcohol use: Not Currently   Allergies  Allergen Reactions   Omeprazole Rash   Prior to Admission medications   Medication Sig Start Date End Date Taking? Authorizing Provider  aspirin 81 MG chewable tablet Chew 1 tablet (81 mg total) by mouth daily. 01/24/22  Yes Wieting, Richard, MD  feeding supplement (ENSURE ENLIVE / ENSURE PLUS) LIQD Take 237 mLs by mouth 3 (three) times daily between meals. 01/23/22  Yes Wieting, Richard, MD  furosemide (LASIX) 40 MG tablet Take one  tablet daily (can take extra tab in afternoon if gain 3 lbs in a day or 5 lbs in a week) 01/30/22  Yes Darylene Price A, FNP  hydrALAZINE (APRESOLINE) 50 MG tablet Take 1 tablet (50 mg total) by mouth 3 (three) times daily. 02/27/22  Yes Agbor-Etang, Aaron Edelman, MD  levothyroxine (SYNTHROID) 100 MCG tablet Take 1 tablet (100 mcg total) by mouth daily. 04/25/21  Yes Masoud, Viann Shove, MD  losartan (COZAAR) 100 MG tablet Take 1 tablet (100 mg total) by mouth daily. 01/30/22  Yes Darylene Price A,  FNP  metoprolol succinate (TOPROL XL) 25 MG 24 hr tablet Take 1 tablet (25 mg total) by mouth at bedtime. 01/30/22  Yes Alisa Graff, FNP  Multiple Vitamin (MULTIVITAMIN WITH MINERALS) TABS tablet Take 1 tablet by mouth daily. 01/23/22  Yes Wieting, Richard, MD  spironolactone (ALDACTONE) 25 MG tablet Take 1 tablet (25 mg total) by mouth daily. 01/30/22  Yes Loyola Santino, Otila Kluver A, FNP  lidocaine-prilocaine (EMLA) cream Apply 1 application topically as needed. Apply small amount to port site at least 1 hour prior to it being accessed, cover with plastic wrap 12/01/20   Earlie Server, MD    Review of Systems  Constitutional:  Positive for fatigue (minimal). Negative for appetite change.  HENT:  Negative for congestion, postnasal drip and sore throat.   Eyes: Negative.   Respiratory:  Positive for cough. Negative for chest tightness, shortness of breath and wheezing.   Cardiovascular:  Positive for leg swelling. Negative for chest pain and palpitations.  Gastrointestinal:  Negative for abdominal distention and abdominal pain.  Endocrine: Negative.   Genitourinary: Negative.   Musculoskeletal:  Positive for arthralgias (legs/ feet). Negative for neck pain.  Skin: Negative.   Allergic/Immunologic: Negative.   Neurological:  Positive for headaches (in the mornings). Negative for dizziness and light-headedness.  Hematological:  Negative for adenopathy. Does not bruise/bleed easily.  Psychiatric/Behavioral:  Negative for dysphoric mood and sleep disturbance (sleeping on 1-2 pillows). The patient is not nervous/anxious.    Vitals:   03/28/22 0921  BP: (!) 178/84  Pulse: 60  Resp: 16  SpO2: 100%  Weight: 114 lb 6 oz (51.9 kg)   Wt Readings from Last 3 Encounters:  03/28/22 114 lb 6 oz (51.9 kg)  03/10/22 109 lb (49.4 kg)  02/27/22 111 lb 12.8 oz (50.7 kg)   Lab Results  Component Value Date   CREATININE 1.07 (H) 03/10/2022   CREATININE 0.95 02/03/2022   CREATININE 1.17 (H) 01/23/2022    Physical Exam Vitals and nursing note reviewed. Exam conducted with a chaperone present (husband).  Constitutional:      Appearance: Normal appearance.  HENT:     Head: Normocephalic and atraumatic.  Cardiovascular:     Rate and Rhythm: Normal rate and regular rhythm.  Pulmonary:     Effort: Pulmonary effort is normal. No respiratory distress.     Breath sounds: No wheezing or rales.  Abdominal:     General: There is no distension.     Palpations: Abdomen is soft.  Musculoskeletal:        General: No tenderness.     Cervical back: Normal range of motion and neck supple.     Right lower leg: Edema (1+ pitting) present.     Left lower leg: Edema (1+ pitting) present.  Skin:    General: Skin is warm and dry.  Neurological:     General: No focal deficit present.     Mental Status: She is alert and  oriented to person, place, and time.  Psychiatric:        Mood and Affect: Mood normal.        Behavior: Behavior normal.        Thought Content: Thought content normal.   Assessment & Plan:  1: Chronic heart failure with preserved ejection fraction without structural changes- - NYHA class III - euvolemic today - weighing daily; instructed to call for an overnight weight gain of > 2 pounds or a weekly weight gain of > 5 pounds - weight up 2 pounds from last visit here 2 months ago - adding salt but is trying to use less; reviewed the importance of not adding any salt and to try using Mrs Deliah Boston seasoning instead of salt - drinking 2 cups of coffee and 3 cups water daily; reviewed importance of keeping daily fluid intake to 60-64 ounces - saw cardiology (Agbor-Etang) 02/27/22 - begin jardiance '10mg'$  daily  - check BMP next visit - BNP 02/03/22 was 2073.3  2: HTN with CKD- - BP elevated (168/58) - saw PCP Edwina Barth) 01/31/22 - BMP 03/10/22 reviewed and showed sodium 133, potassium 4.0, creatinine 1.07 & GFR 56 - saw nephrology Candiss Norse) 02/09/22  3: Ovarian cancer- - saw oncology  Tasia Catchings) 03/10/22  4: Anemia - hemoglobin 03/10/22 was 8.1  5: Lymphedema- - stage 2 - limited in her ability to exercise due to her symptoms - has tried wearing compression socks/ ACE wraps but they hurt her legs - does elevate her legs when sitting for long periods but edema persists - discussed lymphapress compression boots and she is interested so referral made   Medication list reviewed.   Return in 6 weeks, sooner if needed

## 2022-04-07 ENCOUNTER — Inpatient Hospital Stay: Payer: Medicare Other

## 2022-04-07 DIAGNOSIS — Z452 Encounter for adjustment and management of vascular access device: Secondary | ICD-10-CM | POA: Diagnosis not present

## 2022-04-07 DIAGNOSIS — I130 Hypertensive heart and chronic kidney disease with heart failure and stage 1 through stage 4 chronic kidney disease, or unspecified chronic kidney disease: Secondary | ICD-10-CM | POA: Diagnosis not present

## 2022-04-07 DIAGNOSIS — C569 Malignant neoplasm of unspecified ovary: Secondary | ICD-10-CM

## 2022-04-07 DIAGNOSIS — D6481 Anemia due to antineoplastic chemotherapy: Secondary | ICD-10-CM | POA: Diagnosis not present

## 2022-04-07 DIAGNOSIS — N189 Chronic kidney disease, unspecified: Secondary | ICD-10-CM | POA: Diagnosis not present

## 2022-04-07 DIAGNOSIS — C561 Malignant neoplasm of right ovary: Secondary | ICD-10-CM | POA: Diagnosis not present

## 2022-04-07 DIAGNOSIS — I5033 Acute on chronic diastolic (congestive) heart failure: Secondary | ICD-10-CM | POA: Diagnosis not present

## 2022-04-07 DIAGNOSIS — D539 Nutritional anemia, unspecified: Secondary | ICD-10-CM

## 2022-04-07 LAB — COMPREHENSIVE METABOLIC PANEL
ALT: 26 U/L (ref 0–44)
AST: 33 U/L (ref 15–41)
Albumin: 4.1 g/dL (ref 3.5–5.0)
Alkaline Phosphatase: 64 U/L (ref 38–126)
Anion gap: 5 (ref 5–15)
BUN: 26 mg/dL — ABNORMAL HIGH (ref 8–23)
CO2: 22 mmol/L (ref 22–32)
Calcium: 9 mg/dL (ref 8.9–10.3)
Chloride: 108 mmol/L (ref 98–111)
Creatinine, Ser: 1.13 mg/dL — ABNORMAL HIGH (ref 0.44–1.00)
GFR, Estimated: 52 mL/min — ABNORMAL LOW (ref >60)
Glucose, Bld: 105 mg/dL — ABNORMAL HIGH (ref 70–99)
Potassium: 4.1 mmol/L (ref 3.5–5.1)
Sodium: 135 mmol/L (ref 135–145)
Total Bilirubin: 0.6 mg/dL (ref 0.3–1.2)
Total Protein: 7.3 g/dL (ref 6.5–8.1)

## 2022-04-07 LAB — IRON AND TIBC
Iron: 69 ug/dL (ref 28–170)
Saturation Ratios: 20 % (ref 10.4–31.8)
TIBC: 340 ug/dL (ref 250–450)
UIBC: 271 ug/dL

## 2022-04-07 LAB — FERRITIN: Ferritin: 163 ng/mL (ref 11–307)

## 2022-04-07 LAB — CBC WITH DIFFERENTIAL/PLATELET
Abs Immature Granulocytes: 0.01 10*3/uL (ref 0.00–0.07)
Basophils Absolute: 0 10*3/uL (ref 0.0–0.1)
Basophils Relative: 1 %
Eosinophils Absolute: 0.1 10*3/uL (ref 0.0–0.5)
Eosinophils Relative: 2 %
HCT: 24.3 % — ABNORMAL LOW (ref 36.0–46.0)
Hemoglobin: 7.9 g/dL — ABNORMAL LOW (ref 12.0–15.0)
Immature Granulocytes: 0 %
Lymphocytes Relative: 21 %
Lymphs Abs: 0.6 10*3/uL — ABNORMAL LOW (ref 0.7–4.0)
MCH: 33.6 pg (ref 26.0–34.0)
MCHC: 32.5 g/dL (ref 30.0–36.0)
MCV: 103.4 fL — ABNORMAL HIGH (ref 80.0–100.0)
Monocytes Absolute: 0.3 10*3/uL (ref 0.1–1.0)
Monocytes Relative: 10 %
Neutro Abs: 1.8 10*3/uL (ref 1.7–7.7)
Neutrophils Relative %: 66 %
Platelets: 119 10*3/uL — ABNORMAL LOW (ref 150–400)
RBC: 2.35 MIL/uL — ABNORMAL LOW (ref 3.87–5.11)
RDW: 13.8 % (ref 11.5–15.5)
WBC: 2.8 10*3/uL — ABNORMAL LOW (ref 4.0–10.5)
nRBC: 0 % (ref 0.0–0.2)

## 2022-04-07 LAB — FOLATE: Folate: 22.2 ng/mL (ref >5.9)

## 2022-04-07 LAB — VITAMIN B12: Vitamin B-12: 334 pg/mL (ref 180–914)

## 2022-04-08 LAB — CA 125: Cancer Antigen (CA) 125: 36.5 U/mL (ref 0.0–38.1)

## 2022-04-14 ENCOUNTER — Inpatient Hospital Stay: Payer: Medicare Other | Attending: Oncology | Admitting: Oncology

## 2022-04-14 ENCOUNTER — Encounter: Payer: Self-pay | Admitting: Oncology

## 2022-04-14 VITALS — BP 181/87 | HR 71 | Temp 96.8°F | Wt 115.1 lb

## 2022-04-14 DIAGNOSIS — D539 Nutritional anemia, unspecified: Secondary | ICD-10-CM | POA: Diagnosis not present

## 2022-04-14 DIAGNOSIS — I5033 Acute on chronic diastolic (congestive) heart failure: Secondary | ICD-10-CM | POA: Diagnosis not present

## 2022-04-14 DIAGNOSIS — R1900 Intra-abdominal and pelvic swelling, mass and lump, unspecified site: Secondary | ICD-10-CM | POA: Diagnosis not present

## 2022-04-14 DIAGNOSIS — D631 Anemia in chronic kidney disease: Secondary | ICD-10-CM | POA: Insufficient documentation

## 2022-04-14 DIAGNOSIS — N189 Chronic kidney disease, unspecified: Secondary | ICD-10-CM | POA: Insufficient documentation

## 2022-04-14 DIAGNOSIS — Z95828 Presence of other vascular implants and grafts: Secondary | ICD-10-CM

## 2022-04-14 DIAGNOSIS — Z08 Encounter for follow-up examination after completed treatment for malignant neoplasm: Secondary | ICD-10-CM | POA: Insufficient documentation

## 2022-04-14 DIAGNOSIS — Z8543 Personal history of malignant neoplasm of ovary: Secondary | ICD-10-CM | POA: Insufficient documentation

## 2022-04-14 DIAGNOSIS — C569 Malignant neoplasm of unspecified ovary: Secondary | ICD-10-CM

## 2022-04-14 DIAGNOSIS — I5031 Acute diastolic (congestive) heart failure: Secondary | ICD-10-CM

## 2022-04-14 DIAGNOSIS — I10 Essential (primary) hypertension: Secondary | ICD-10-CM | POA: Diagnosis not present

## 2022-04-14 DIAGNOSIS — I130 Hypertensive heart and chronic kidney disease with heart failure and stage 1 through stage 4 chronic kidney disease, or unspecified chronic kidney disease: Secondary | ICD-10-CM | POA: Diagnosis not present

## 2022-04-14 NOTE — Assessment & Plan Note (Signed)
Continue port flush 

## 2022-04-14 NOTE — Assessment & Plan Note (Addendum)
No significant improvement after holding chemotherapy.  Recommend B12 IM 1058mg injection monthly x 3.  Also there may be a component of anemia in CKD. Recommend IV Venfoer I discussed about the potential risks including but not limited to allergic reactions/infusion reactions including anaphylactic reactions, phlebitis, high blood pressure, wheezing, SOB, skin rash, weight gain,dark urine, leg swelling, back pain, headache, nausea and fatigue, etc. Patient agrees. Plan IV venofer weekly x 3

## 2022-04-14 NOTE — Assessment & Plan Note (Signed)
still need to be optimized.  Follow up with cardiology and nephrology

## 2022-04-14 NOTE — Assessment & Plan Note (Signed)
Labs reviewed and discussed with patient. Recent hospitalization medical records were reviewed.  Discontinue Doxorubicin due to acute CHF with preserved EF.  CA125 -improved.  CT chest abdomen pelvis w contrast showed stable disease. - repeat in 4 weeks.  Folate receptor alpha positive   Recommend continue observation, and in the future, when she progresses, consider Mirvetuximab

## 2022-04-14 NOTE — Progress Notes (Signed)
Hematology/Oncology Progress note [phone] [phone]       CHIEF COMPLAINTS/PURPOSE OF VISIT Follow up for chemotherapy for ovarian cancer.  ASSESSMENT & PLAN:   Cancer Staging  Malignant neoplasm of ovary [facility]) Staging form: Ovary, Fallopian Tube, and Primary Peritoneal Carcinoma, AJCC 8th Edition - Clinical stage from 11/22/2016: Stage IIIC (cT3c, cN1b, cM0) - Signed by Earlie Server, MD on 11/22/2016   Malignant neoplasm of ovary [facility]) Labs reviewed and discussed with patient. Recent hospitalization medical records were reviewed.  Discontinue Doxorubicin due to acute CHF with preserved EF.  CA125 -improved.  CT chest abdomen pelvis w contrast showed stable disease. - repeat in 4 weeks.  Folate receptor alpha positive   Recommend continue observation, and in the future, when she progresses, consider Mirvetuximab   Hypertension still need to be optimized.  Follow up with cardiology and nephrology  CHF (congestive heart failure) ([place]) Follow up with cardiology  Macrocytic anemia No significant improvement after holding chemotherapy.  Recommend B12 IM 1045mg injection monthly x 3.  Also there may be a component of anemia in CKD. Recommend IV Venfoer I discussed about the potential risks including but not limited to allergic reactions/infusion reactions including anaphylactic reactions, phlebitis, high blood pressure, wheezing, SOB, skin rash, weight gain,dark urine, leg swelling, back pain, headache, nausea and fatigue, etc. Patient agrees. Plan IV venofer weekly x 3   Port-A-Cath in place Continue port flush     Orders Placed This Encounter  Procedures   CT CHEST ABDOMEN PELVIS WO CONTRAST    Standing Status:   Future    Standing Expiration Date:   04/15/2023    Order Specific Question:   Preferred imaging location?    Answer:   [facility]    Order Specific Question:   Is Oral Contrast requested for this exam?    Answer:   Yes, Per Radiology  protocol    Order Specific Question:   Does the patient have a contrast media/X-ray dye allergy?    Answer:   No   CBC with Differential/Platelet    Standing Status:   Future    Standing Expiration Date:   04/14/2023   Comprehensive metabolic panel    Standing Status:   Future    Standing Expiration Date:   04/14/2023   CA 125    Standing Status:   Future    Standing Expiration Date:   04/14/2023   Iron and TIBC([facility])    Standing Status:   Future    Standing Expiration Date:   04/15/2023   Ferritin    Standing Status:   Future    Standing Expiration Date:   04/15/2023   Retic Panel    Standing Status:   Future    Standing Expiration Date:   04/15/2023   Follow-up  Per LOS  All questions were answered. The patient knows to call the clinic with any problems, questions or concerns.  ZEarlie Server MD, PhD [facility] Hematology Oncology 04/14/2022      HISTORY OF PRESENTING ILLNESS: Katie [id number].o. female presents for follow up of management of recurrent  ovarian cancer. Oncology History  Malignant neoplasm of ovary ([facility]  09/20/2011 Imaging   CT chest abdomen pelvis w contrast 1. No significant interval changed compared with the previous exam.2. Stable appearance of soft tissue attenuating lesion in the posterolateral right hemipelvis. This may represent a treated site of peritoneal disease.3. Unchanged appearance of borderline enlarged left periaortic and aortocaval retroperitoneal lymph nodes.4. Unchanged 6  mm left lower lobe lung nodule. 5. No new signs of metastatic disease within the chest, abdomen or pelvis. 6. Aortic Atherosclerosis (ICD10-I70.0) and Emphysema    11/20/2016 Initial Diagnosis   Malignant neoplasm of ovary   Pathology 11/16/2016 Surgical Pathology  CASE: [id number]  PATIENT: Katie Hill  Surgical Pathology Report   SPECIMEN SUBMITTED:  A. Retroperitoneal adenopathy, left  DIAGNOSIS:  A. LYMPH NODE, LEFT RETROPERITONEAL; CT-GUIDED  CORE BIOPSY:  - METASTATIC HIGH-GRADE SEROUS CARCINOMA.  Pathology 03/14/2017   DIAGNOSIS:  A. OMENTUM; OMENTECTOMY:  - NO TUMOR SEEN.  - ONE NEGATIVE LYMPH NODE (0/1).   B.  RIGHT FALLOPIAN TUBE AND OVARY; SALPINGO-OOPHORECTOMY:  - SMALL FOCI OF HIGH GRADE SEROUS CARCINOMA INVOLVING THE OVARY.  - MARKED THERAPY RELATED CHANGE.  - NO TUMOR SEEN IN THE FALLOPIAN TUBE.   C.  UTERUS, CERVIX, LEFT FALLOPIAN TUBE AND OVARY; HYSTERECTOMY AND LEFT  SALPINGO-OOPHORECTOMY:  - NABOTHIAN CYSTS.  - CYSTIC ATROPHY OF THE ENDOMETRIUM.  - SEROSAL ADHESIONS.  - UNREMARKABLE FALLOPIAN TUBE.  - OVARY SHOWING TREATMENT RELATED CHANGE.   D.  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    12/01/2016 -  Chemotherapy   Carboplatin and taxol x 4 neoadjuvant chemotherapy    03/14/2017 Surgery   Patient had debulking surgery on 03/14/2017. She had a laparoscopy with conversion to laparotomy, total hysterectomy, with bilateral salpingo oophorectomy, right aortic lymph node dissection, omentectomy. Pathology showed small foci of residual disease in ovary.   HRD positive, declined Olarparib maintenance trial    03/28/2017 -  Chemotherapy   Adjuvant carbo and taxol x3 cycles   02/28/2018 Progression   Local Recurrence. CA125 146 CT abdomen pelvis w contrast showed interval enlargement of an aortocaval lymph node which currently measures 1.4 cm in short axis (axial image 75 of series 2). This lymph node is in close proximity to the previously resected retroperitoneal lymphadenopathy and is very concerning for an additional nodal metastasis.    03/15/2018 -  Chemotherapy   03/15/2018-05/17/2018 Carboplatin and Taxol x 4.  CA125 decrease from 49.7 to 6 after 4 cycles of treatment.  05/17/2018-10/29/20   Olarparib '300mg'$  BID    11/05/2018 Imaging   MRI abdomenPelvis on 11/05/2018 showed Interval progression of, now measuring 2.7 x 2.3 cm and  concerning for progression of metastatic disease.retrocaval lymph node in the abdomen Olaparib was held for short period of time. Her CA125 trended down again.  Dr. Fransisca Connors recommending resume olaparib and continue monitor.   10/06/2020 Progression    CT chest abdomen pelvis with contrast showed new lymph nodes in the prevascular space of the upper anterior mediastinum most consistent with metastasis.  Single new right lower lobe pulmonary nodule is concerning for early pulmonary metastasis.  Interval enlargement of the left periaortic retroperitoneal lymph node.  Stable adjacent aorto caval adenopathy.    Genetic Testing   Genetic testing negative for 83 genes on [facility]'s Multi-Cancer panel (ALK, APC, ATM, AXIN2, BAP1, BARD1, BLM, BMPR1A, BRCA1, BRCA2, BRIP1, CASR, CDC73, CDH1, CDK4, CDKN1B, CDKN1C, CDKN2A, CEBPA, CHEK2, CTNNA1, DICER1, DIS3L2, EGFR, EPCAM, FH, FLCN, GATA2, GPC3, GREM1, HOXB13, HRAS, KIT, MAX, MEN1, MET, MITF, MLH1, MSH2, MSH3, MSH6, MUTYH, NBN, NF1, NF2, NTHL1, PALB2, PDGFRA, PHOX2B, PMS2, POLD1, POLE, POT1, PRKAR1A, PTCH1, PTEN, RAD50, RAD51C, RAD51D, RB1, RECQL4, RET, RUNX1, SDHA, SDHAF2, SDHB, SDHC, SDHD, SMAD4, SMARCA4, SMARCB1, SMARCE1, STK11, SUFU, TERC, TERT, TMEM127, TP53, TSC1, TSC2, VHL, WRN, WT1).   A Variant of Uncertain Significance  was detected: CASR c.106G>A (p.Gly36Arg).  Myraid testing negative for somatic BRACA1/2, positive for HRD.    11/03/2020 - 11/18/2021 Chemotherapy   Liposomal Doxorubicin + Bevacizumab q28d   11/03/20 -05/20/21 Doxil + Bevacizumab q28d  06/03/21 Doxil only due to proteinuria.  07/01/21-07/15/21  Resumed on Doxil + Bevacizumab 07/29/21 Doxil only due to proteinuria.  11/18/21 last dose of Doxil. Stopped due to CHF   10/14/2021 Echocardiogram    Left ventricular ejection fraction, by estimation, is 55 to 60%   12/16/2021 Imaging   [place] lower extremities 1. No evidence of deep venous thrombosis in either lower extremity. 2. 1.9 cm left popliteal  fossa Baker's cyst.   12/23/2021 Echocardiogram   LVEF 55%   12/26/2021 Imaging   CT chest abdomen pelvis w contrast   Chest Impression:   1. New RIGHT pleural effusion and new mild nodularity along the RIGHT oblique fissure. Recommend close attention on follow-up for potential pleural metastasis in the RIGHT hemithorax. 2. Stable LEFT lobe pulmonary nodule.3. No mediastinal lymphadenopathy.   Abdomen / Pelvis Impression:   1. Stable soft tissue lesion in the deep RIGHT pelvis. 2. Mass stable small periaortic lymph nodes. 3. No intraperitoneal free fluid the abdomen or pelvis.   01/20/2022 - 01/23/2022 Hospital Admission   Hospitalized due to acute on chronic diastolic CHF, hypertension urgency, She was seen by cardiology and nephrology. BNP was significantly elevated at 4500 She was discharged home with Losartan, Aldactone, Lasix, beta blocker and hydralazine.    02/21/2022 Imaging   CT chest abdomen pelvis w contrast Stable small soft tissue lesion in the right perirectal lesion. Stable mild retroperitoneal and retrocrural lymphadenopathy.Stable 6 mm left lower lobe pulmonary nodule.No new or progressive disease within the chest, abdomen, or pelvis   02/22/2022 Imaging   CT chest abdomen pelvis w contrast Stable small soft tissue lesion in the right perirectal lesion. Stable mild retroperitoneal and retrocrural lymphadenopathy. Stable 6 mm left lower lobe pulmonary nodule. No new or progressive disease within the chest, abdomen, or pelvis.   High grade ovarian cancer [facility])    INTERVAL HISTORY Katie Hill is a 72 y.o. female who has above history reviewed by me today presents for follow up visit for recurrent Ovarian cancer Today she feels well. Husband has been doing better and accompanied her to today's visit.  Leg swelling has improved.   . Review of Systems  Constitutional:  Positive for fatigue. Negative for appetite change, chills and fever.  HENT:   Negative  for hearing loss and voice change.   Eyes:  Negative for eye problems.  Respiratory:  Negative for chest tightness and cough.   Cardiovascular:  Positive for leg swelling. Negative for chest pain.  Gastrointestinal:  Negative for abdominal distention, abdominal pain and blood in stool.  Endocrine: Negative for hot flashes.  Genitourinary:  Negative for difficulty urinating and frequency.   Musculoskeletal:  Negative for arthralgias.  Skin:  Negative for itching and rash.  Neurological:  Negative for extremity weakness.  Hematological:  Negative for adenopathy.  Psychiatric/Behavioral:  Negative for confusion.      [name] MEDICAL HISTORY: Past Medical History:  Diagnosis Date   (HFpEF) heart failure with preserved ejection fraction ([place])    a. 12/2021 Echo: EF 55%, no rwma, nl RV fxn, mild MR, mild-mod TR. Triv AI.   Anemia    CHF (congestive heart failure) ([facility])    Chronic kidney disease    Dysrhythmia    Essential hypertension    Genetic testing  03/28/2017   Multi-Cancer panel (83 genes) @ [facility] - No pathogenic mutations detected   High grade ovarian cancer ([place]) 11/20/2016   Pelvic mass in female     SURGICAL HISTORY: Past Surgical History:  Procedure Laterality Date   APPENDECTOMY     LAPAROSCOPY N/A 03/14/2017   Procedure: LAPAROSCOPY OPERATIVE;  Surgeon: Mellody Drown, MD;  Location: [facility] ORS;  Service: Gynecology;  Laterality: N/A;   LAPAROTOMY N/A 03/14/2017   Procedure: LAPAROTOMY;  Surgeon: Mellody Drown, MD;  Location: [facility] ORS;  Service: Gynecology;  Laterality: N/A;   LYMPH NODE DISSECTION N/A 03/14/2017   Procedure: LYMPH NODE DISSECTION;  Surgeon: Mellody Drown, MD;  Location: [facility] ORS;  Service: Gynecology;  Laterality: N/A;   OMENTECTOMY N/A 03/14/2017   Procedure: OMENTECTOMY;  Surgeon: Mellody Drown, MD;  Location: [facility] ORS;  Service: Gynecology;  Laterality: N/A;   PORTA CATH INSERTION N/A 11/27/2016   Procedure: Glori Luis Cath Insertion;  Surgeon: Algernon Huxley, MD;  Location: [facility];  Service: Cardiovascular;  Laterality: N/A;    SOCIAL HISTORY: Social History   Tobacco Use   Smoking status: Never   Smokeless tobacco: Never  Vaping Use   Vaping Use: Never used  Substance Use Topics   Alcohol use: Not Currently   Drug use: No     FAMILY HISTORY Family History  Problem Relation Age of Onset   Throat cancer Cousin    Throat cancer Cousin    Leukemia Cousin     ALLERGIES:  is allergic to omeprazole.  MEDICATIONS:  Current Outpatient Medications  Medication Sig Dispense Refill   aspirin 81 MG chewable tablet Chew 1 tablet (81 mg total) by mouth daily. 30 tablet 0   empagliflozin (JARDIANCE) 10 MG TABS tablet Take 1 tablet (10 mg total) by mouth daily before breakfast. 90 tablet 3   feeding supplement (ENSURE ENLIVE / ENSURE PLUS) LIQD Take 237 mLs by mouth 3 (three) times daily between meals. 14220 mL 0   furosemide (LASIX) 40 MG tablet Take one tablet daily (can take extra tab in afternoon if gain 3 lbs in a day or 5 lbs in a week) 90 tablet 3   hydrALAZINE (APRESOLINE) 50 MG tablet Take 1 tablet (50 mg total) by mouth 3 (three) times daily. 90 tablet 1   levothyroxine (SYNTHROID) 100 MCG tablet Take 1 tablet (100 mcg total) by mouth daily. 90 tablet 3   lidocaine-prilocaine (EMLA) cream Apply 1 application topically as needed. Apply small amount to port site at least 1 hour prior to it being accessed, cover with plastic wrap 30 g 1   losartan (COZAAR) 100 MG tablet Take 1 tablet (100 mg total) by mouth daily. 90 tablet 3   metoprolol succinate (TOPROL XL) 25 MG 24 hr tablet Take 1 tablet (25 mg total) by mouth at bedtime. 90 tablet 3   Multiple Vitamin (MULTIVITAMIN WITH MINERALS) TABS tablet Take 1 tablet by mouth daily.     spironolactone (ALDACTONE) 25 MG tablet Take 1 tablet (25 mg total) by mouth daily. 90 tablet 3   No current facility-administered medications for this visit.   Facility-Administered  Medications Ordered in Other Visits  Medication Dose Route Frequency Provider Last Rate Last Admin   pegfilgrastim (NEULASTA ONPRO KIT) 6 MG/0.6ML injection             PHYSICAL EXAMINATION:  ECOG PERFORMANCE STATUS: 0 - Asymptomatic Vitals:   04/14/22 1143  BP: (!) 181/87  Pulse: 71  Temp: (!) 96.8  F (36 C)  SpO2: 99%     Filed Weights   04/14/22 1143  Weight: 115 lb 1.6 oz (52.2 kg)      Physical Exam Constitutional:      General: She is not in acute distress.    Appearance: She is not diaphoretic.     Comments: Thin built, she walks independantly  HENT:     Head: Normocephalic and atraumatic.     Nose: Nose normal.     Mouth/Throat:     Pharynx: No oropharyngeal exudate.  Eyes:     General: No scleral icterus.    Pupils: Pupils are equal, round, and reactive to light.  Neck:     Vascular: No JVD.  Cardiovascular:     Rate and Rhythm: Normal rate and regular rhythm.     Heart sounds: No murmur heard. Pulmonary:     Effort: Pulmonary effort is normal. No respiratory distress.     Breath sounds: Normal breath sounds. No rales.  Chest:     Chest wall: No tenderness.  Abdominal:     General: Bowel sounds are normal. There is no distension.     Palpations: Abdomen is soft.     Tenderness: There is no abdominal tenderness.  Musculoskeletal:        General: Normal range of motion.     Cervical back: Normal range of motion and neck supple.     Right lower leg: Edema present.     Left lower leg: Edema present.  Lymphadenopathy:     Cervical: No cervical adenopathy.  Skin:    General: Skin is warm and dry.     Findings: No erythema or rash.     Comments: Right anterior medi port +   Neurological:     Mental Status: She is alert and oriented to person, place, and time.     Cranial Nerves: No cranial nerve deficit.     Motor: No abnormal muscle tone.     Coordination: Coordination normal.  Psychiatric:        Mood and Affect: Affect normal.         Judgment: Judgment normal.        LABORATORY DATA: I have personally reviewed the data as listed:     Latest Ref Rng & Units 04/07/2022    9:58 AM 03/10/2022    9:02 AM 02/03/2022    8:12 AM  CBC  WBC 4.0 - 10.5 K/uL 2.8  2.9  3.3   Hemoglobin 12.0 - 15.0 g/dL 7.9  8.1  9.0   Hematocrit 36.0 - 46.0 % 24.3  25.4  27.5   Platelets 150 - 400 K/uL 119  136  127       Latest Ref Rng & Units 04/07/2022    9:58 AM 03/10/2022    9:02 AM 02/03/2022    8:12 AM  CMP  Glucose 70 - 99 mg/dL 105  125  115   BUN 8 - 23 mg/dL '26  26  19   '$ Creatinine 0.44 - 1.00 mg/dL 1.13  1.07  0.95   Sodium 135 - 145 mmol/L 135  132  133   Potassium 3.5 - 5.1 mmol/L 4.1  4.0  3.9   Chloride 98 - 111 mmol/L 108  102  105   CO2 22 - 32 mmol/L '22  22  23   '$ Calcium 8.9 - 10.3 mg/dL 9.0  9.4  9.2   Total Protein 6.5 - 8.1 g/dL 7.3  7.3  7.0  Total Bilirubin 0.3 - 1.2 mg/dL 0.6  0.6  0.5   Alkaline Phos 38 - 126 U/L 64  56  53   AST 15 - 41 U/L 33  28  20   ALT 0 - 44 U/L '26  18  12      '$ RADIOGRAPHIC STUDIES: I have personally reviewed the radiological images as listed and agreed with the findings in the report. CT CHEST ABDOMEN PELVIS W CONTRAST  Result Date: 02/22/2022 CLINICAL DATA:  Follow-up recurrent ovarian carcinoma. On oral chemotherapy. * Tracking Code: BO * EXAM: CT CHEST, ABDOMEN, AND PELVIS WITH CONTRAST TECHNIQUE: Multidetector CT imaging of the chest, abdomen and pelvis was performed following the standard protocol during bolus administration of intravenous contrast. RADIATION DOSE REDUCTION: This exam was performed according to the departmental dose-optimization program which includes automated exposure control, adjustment of the mA and/or kV according to patient size and/or use of iterative reconstruction technique. CONTRAST:  68m OMNIPAQUE IOHEXOL 300 MG/ML  SOLN COMPARISON:  12/26/2021 FINDINGS: CT CHEST FINDINGS Cardiovascular: No acute findings.  Stable cardiomegaly. Mediastinum/Lymph  Nodes: No masses or pathologically enlarged lymph nodes identified. Lungs/Pleura: Tiny right pleural effusion has decreased in size since previous study. Stable mild central bronchiectasis noted bilaterally. 6 mm well-circumscribed pulmonary nodule in the posterior left lower lobe is stable. No new or suspicious pulmonary nodules identified. Thickening of interlobular septi in both lung bases shows decrease since prior exam. Musculoskeletal:  No suspicious bone lesions identified. CT ABDOMEN AND PELVIS FINDINGS Hepatobiliary: No masses identified. Gallbladder is unremarkable. No evidence of biliary ductal dilatation. Pancreas:  No mass or inflammatory changes. Spleen:  Within normal limits in size and appearance. Adrenals/Urinary tract: No suspicious masses or hydronephrosis. Stomach/Bowel: No evidence of obstruction, inflammatory process, or abnormal fluid collections. Vascular/Lymphatic: Mild lymphadenopathy in retrocrural, left para-aortic, and aortocaval spaces shows no significant change, with index lymph node in the left para-aortic region measuring 11 mm on image 67/2. No pelvic lymphadenopathy identified. No acute vascular findings. Reproductive: Prior hysterectomy again noted. Soft tissue density in the posterior pelvis in the right perirectal region measuring 2.2 x 1.5 cm on image 107/2 shows no significant change since prior study. No other soft tissue masses or ascites. Other:  None. Musculoskeletal:  No suspicious bone lesions identified. IMPRESSION: Stable small soft tissue lesion in the right perirectal lesion. Stable mild retroperitoneal and retrocrural lymphadenopathy. Stable 6 mm left lower lobe pulmonary nodule. No new or progressive disease within the chest, abdomen, or pelvis. Electronically Signed   By: JMarlaine HindM.D.   On: 02/22/2022 10:31   [place]  Result Date: 01/21/2022 CLINICAL DATA:  Edema. EXAM: RENAL / URINARY TRACT ULTRASOUND COMPLETE COMPARISON:  None Available. FINDINGS:  Right Kidney: Renal measurements: 11.9 x 4.7 x 5.4 cm = volume: 157.2 mL. Echogenicity within normal limits. No mass or hydronephrosis visualized. Left Kidney: Renal measurements: 11.4 x 4.4 x 5.4 cm = volume: 144.2 mL. Echogenicity within normal limits. No mass or hydronephrosis visualized. Bladder: Appears normal for degree of bladder distention. Other: Bilateral pleural effusions. IMPRESSION: Normal sonographic appearance of the kidneys.  Normal bladder. Electronically Signed   By: DLajean ManesM.D.   On: 01/21/2022 13:06   CT Head Wo Contrast  Result Date: 01/20/2022 CLINICAL DATA:  Headache, sudden, severe EXAM: CT HEAD WITHOUT CONTRAST TECHNIQUE: Contiguous axial images were obtained from the base of the skull through the vertex without intravenous contrast. RADIATION DOSE REDUCTION: This exam was performed according to the departmental dose-optimization program  which includes automated exposure control, adjustment of the mA and/or kV according to patient size and/or use of iterative reconstruction technique. COMPARISON:  CT head January 13, 2022. FINDINGS: Brain: No evidence of acute infarction, hemorrhage, hydrocephalus, extra-axial collection or mass lesion/mass effect. Vascular: No hyperdense vessel. Skull: No acute fracture. Sinuses/Orbits: Clear sinuses.  No acute orbital findings. Other: No mastoid effusions IMPRESSION: No evidence of acute intracranial abnormality. Electronically Signed   By: Margaretha Sheffield M.D.   On: 01/20/2022 16:37   DG Chest 2 View  Result Date: 01/20/2022 CLINICAL DATA:  Hypertension EXAM: CHEST - 2 VIEW COMPARISON:  CT 12/26/2021 FINDINGS: Right-sided central venous port tip over the cavoatrial region. Small bilateral pleural effusions with airspace disease at both bases. Partially obscured cardiomediastinal silhouette. No pneumothorax. IMPRESSION: Small bilateral pleural effusions with basilar atelectasis or pneumonia Electronically Signed   By: Donavan Foil M.D.    On: 01/20/2022 16:35

## 2022-04-14 NOTE — Assessment & Plan Note (Signed)
Follow up with cardiology

## 2022-04-17 DIAGNOSIS — R600 Localized edema: Secondary | ICD-10-CM | POA: Diagnosis not present

## 2022-04-17 DIAGNOSIS — I10 Essential (primary) hypertension: Secondary | ICD-10-CM | POA: Diagnosis not present

## 2022-04-17 DIAGNOSIS — R809 Proteinuria, unspecified: Secondary | ICD-10-CM | POA: Diagnosis not present

## 2022-04-21 ENCOUNTER — Ambulatory Visit: Payer: Medicare Other | Attending: Cardiology | Admitting: Cardiology

## 2022-04-21 ENCOUNTER — Encounter: Payer: Self-pay | Admitting: Cardiology

## 2022-04-21 VITALS — BP 166/88 | HR 48 | Ht 67.0 in | Wt 111.2 lb

## 2022-04-21 DIAGNOSIS — I10 Essential (primary) hypertension: Secondary | ICD-10-CM

## 2022-04-21 DIAGNOSIS — I5032 Chronic diastolic (congestive) heart failure: Secondary | ICD-10-CM

## 2022-04-21 MED ORDER — HYDRALAZINE HCL 100 MG PO TABS: 100.0000 mg | ORAL_TABLET | Freq: Three times a day (TID) | ORAL | 3 refills | Status: DC

## 2022-04-21 MED FILL — Iron Sucrose Inj 20 MG/ML (Fe Equiv): INTRAVENOUS | Qty: 10 | Status: AC

## 2022-04-21 NOTE — Progress Notes (Signed)
Cardiology Office Note:    Date:  04/21/2022   ID:  Katie Hill, DOB 01/26/1951, MRN [id number]  PCP:  Baxter Hire, MD   [place] [facility] Cardiologist:  Kate Sable, MD     Referring MD: Cletis Athens, MD   Chief Complaint  Patient presents with   Follow-up    HTN, still having high readings at home     History of Present Illness:    Katie Hill is a 72 y.o. female with a hx of hypertension, HFpEF, ovarian cancer, who presents for follow-up.  Previously seen due to elevated blood pressures.  Hydralazine was increased to 50 mg 3 times daily.  Tolerating medications with no adverse effects.  Blood pressures at home still elevated.  Edema is well-controlled with Lasix.  Prior notes/studies Echocardiogram 12/2021 EF 55% History of bradycardia with beta-blocker.  Past Medical History:  Diagnosis Date   (HFpEF) heart failure with preserved ejection fraction ([place])    a. 12/2021 Echo: EF 55%, no rwma, nl RV fxn, mild MR, mild-mod TR. Triv AI.   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   Pelvic mass in female     Past Surgical History:  Procedure Laterality Date   APPENDECTOMY     LAPAROSCOPY N/A 03/14/2017   Procedure: LAPAROSCOPY OPERATIVE;  Surgeon: Mellody Drown, MD;  Location: [facility];  Service: Gynecology;  Laterality: N/A;   LAPAROTOMY N/A 03/14/2017   Procedure: LAPAROTOMY;  Surgeon: Mellody Drown, MD;  Location: [facility];  Service: Gynecology;  Laterality: N/A;   LYMPH NODE DISSECTION N/A 03/14/2017   Procedure: LYMPH NODE DISSECTION;  Surgeon: Mellody Drown, MD;  Location: [facility];  Service: Gynecology;  Laterality: N/A;   OMENTECTOMY N/A 03/14/2017   Procedure: OMENTECTOMY;  Surgeon: Mellody Drown,  MD;  Location: [facility];  Service: Gynecology;  Laterality: N/A;   PORTA CATH INSERTION N/A 11/27/2016   Procedure: Glori Luis Cath Insertion;  Surgeon: Algernon Huxley, MD;  Location: [facility];  Service: Cardiovascular;  Laterality: N/A;    Current Medications: Current Meds  Medication Sig   aspirin 81 MG chewable tablet Chew 1 tablet (81 mg total) by mouth daily.   Cyanocobalamin (B-12) 1000 MCG CAPS Take 1,000 mg by mouth daily.   empagliflozin (JARDIANCE) 10 MG TABS tablet Take 1 tablet (10 mg total) by mouth daily before breakfast.   feeding supplement (ENSURE ENLIVE / ENSURE PLUS) LIQD Take 237 mLs by mouth 3 (three) times daily between meals.   Ferrous Sulfate (IRON) 325 (65 Fe) MG TABS Take 325 mg by mouth daily.   furosemide (LASIX) 40 MG tablet Take one tablet daily (can take extra tab in afternoon if gain 3 lbs in a day or 5 lbs in a week) (Patient taking differently: Take 40 mg by mouth 2 (two) times daily. Take one tablet daily (can take extra tab in afternoon if gain 3 lbs in a day or 5 lbs in a week))   levothyroxine (SYNTHROID) 100 MCG tablet Take 1 tablet (100 mcg total) by mouth daily.   lidocaine-prilocaine (EMLA) cream Apply 1 application topically as needed. Apply small amount to port site at least 1 hour prior to it being accessed, cover with plastic wrap  losartan (COZAAR) 100 MG tablet Take 1 tablet (100 mg total) by mouth daily.   metoprolol succinate (TOPROL XL) 25 MG 24 hr tablet Take 1 tablet (25 mg total) by mouth at bedtime.   spironolactone (ALDACTONE) 25 MG tablet Take 1 tablet (25 mg total) by mouth daily.   [DISCONTINUED] hydrALAZINE (APRESOLINE) 50 MG tablet Take 1 tablet (50 mg total) by mouth 3 (three) times daily.     Allergies:   Omeprazole   Social History   Socioeconomic History   Marital status: Married    Spouse name: Not on file   Number of children: Not on file   Years of education: Not on file   Highest education level: Not on file   Occupational History   Not on file  Tobacco Use   Smoking status: Never   Smokeless tobacco: Never  Vaping Use   Vaping Use: Never used  Substance and Sexual Activity   Alcohol use: Not Currently   Drug use: No   Sexual activity: Yes    Birth control/protection: Post-menopausal  Other Topics Concern   Not on file  Social History Narrative   Lives locally with husband.  Was fairly active.   Social Determinants of Health   Financial Resource Strain: Low Risk  (04/21/2021)   Overall Financial Resource Strain (CARDIA)    Difficulty of Paying Living Expenses: Not hard at all  Food Insecurity: No Food Insecurity (01/22/2022)   Hunger Vital Sign    Worried About Running Out of Food in the Last Year: Never true    Ran Out of Food in the Last Year: Never true  Transportation Needs: No Transportation Needs (01/22/2022)   PRAPARE - Hydrologist (Medical): No    Lack of Transportation (Non-Medical): No  Physical Activity: Insufficiently Active (04/21/2021)   Exercise Vital Sign    Days of Exercise per Week: 6 days    Minutes of Exercise per Session: 20 min  Stress: No Stress Concern Present (04/21/2021)   [place]    Feeling of Stress : Not at all  Social Connections: Moderately Isolated (04/21/2021)   Social Connection and Isolation Panel [NHANES]    Frequency of Communication with Friends and Family: Three times a week    Frequency of Social Gatherings with Friends and Family: Twice a week    Attends Religious Services: Never    Marine scientist or Organizations: No    Attends Music therapist: Never    Marital Status: Married     Family History: The patient's family history includes Leukemia in her cousin; Throat cancer in her cousin and cousin.  ROS:   Please see the history of present illness.     All other systems reviewed and are negative.  EKGs/Labs/Other  Studies Reviewed:    The following studies were reviewed today:   EKG:  EKG not ordered today.    Recent Labs: 12/30/2021: TSH 22.325 01/22/2022: Magnesium 1.8 02/03/2022: B Natriuretic Peptide 2,073.3 04/07/2022: ALT 26; BUN 26; Creatinine, Ser 1.13; Hemoglobin 7.9; Platelets 119; Potassium 4.1; Sodium 135  Recent Lipid Panel No results found for: "CHOL", "TRIG", "HDL", "CHOLHDL", "VLDL", "LDLCALC", "LDLDIRECT"   Risk Assessment/Calculations:         Physical Exam:    VS:  BP (!) 166/88 (BP Location: Left Arm, Patient Position: Sitting, Cuff Size: Normal)   Pulse (!) 48   Ht '5\' 7"'$  (1.702 m)   Wt  111 lb 3.2 oz (50.4 kg)   SpO2 96%   BMI 17.42 kg/m     Wt Readings from Last 3 Encounters:  04/21/22 111 lb 3.2 oz (50.4 kg)  04/14/22 115 lb 1.6 oz (52.2 kg)  03/28/22 114 lb 6 oz (51.9 kg)     GEN:  Well nourished, well developed in no acute distress HEENT: Normal NECK: No JVD; No carotid bruits CARDIAC: RRR, no murmurs, rubs, gallops RESPIRATORY:  Clear to auscultation without rales, wheezing or rhonchi  ABDOMEN: Soft, non-tender, non-distended MUSCULOSKELETAL: Trace to 1+ edema; No deformity  SKIN: Warm and dry NEUROLOGIC:  Alert and oriented x 3 PSYCHIATRIC:  Normal affect   ASSESSMENT:    1. Primary hypertension   2. Chronic diastolic heart failure (HCC)     PLAN:    In order of problems listed above:  Hypertension, BP elevated, increase hydralazine to 50 mg 3 times daily.  Continue losartan 100 mg daily, Aldactone 25 mg daily. HFpEF, trace edema.  Continue Lasix, Aldactone.  EF 55%.  Edema much improved, describes NYHA class II symptoms.  Follow-up in 6 to 8 weeks.     Medication Adjustments/Labs and Tests Ordered: Current medicines are reviewed at length with the patient today.  Concerns regarding medicines are outlined above.  No orders of the defined types were placed in this encounter.  Meds ordered this encounter  Medications   hydrALAZINE  (APRESOLINE) 100 MG tablet    Sig: Take 1 tablet (100 mg total) by mouth 3 (three) times daily.    Dispense:  270 tablet    Refill:  3    Patient Instructions  Medication Instructions:   Increase Hydralazine - take one tablet ('100mg'$ ) by mouth three times a day.   *If you need a refill on your cardiac medications before your next appointment, please call your pharmacy*   Lab Work:  None ordered  If you have labs (blood work) drawn today and your tests are completely normal, you will receive your results only by: [place] (if you have MyChart) OR A paper copy in the mail If you have any lab test that is abnormal or we need to change your treatment, we will call you to review the results.   Testing/Procedures:  None ordered   Follow-Up: At [facility], you and your health needs are our priority.  As part of our continuing mission to provide you with exceptional heart care, we have created designated Provider Care Teams.  These Care Teams include your primary Cardiologist (physician) and Advanced Practice Providers (APPs -  Physician Assistants and Nurse Practitioners) who all work together to provide you with the care you need, when you need it.  We recommend signing up for the patient portal called "MyChart".  Sign up information is provided on this After Visit Summary.  MyChart is used to connect with patients for Virtual Visits (Telemedicine).  Patients are able to view lab/test results, encounter notes, upcoming appointments, etc.  Non-urgent messages can be sent to your provider as well.   To learn more about what you can do with MyChart, go to [email or web address].    Your next appointment:   6 - 8 week(s)  Provider:   You may see Kate Sable, MD or one of the following Advanced Practice Providers on your designated Care Team:   Murray Hodgkins, NP Christell Faith, PA-C Cadence Kathlen Mody, PA-C Gerrie Nordmann, NP   Signed, Kate Sable, MD   04/21/2022 9:34 AM    [facility]

## 2022-04-21 NOTE — Patient Instructions (Signed)
Medication Instructions:   Increase Hydralazine - take one tablet ('100mg'$ ) by mouth three times a day.   *If you need a refill on your cardiac medications before your next appointment, please call your pharmacy*   Lab Work:  None ordered  If you have labs (blood work) drawn today and your tests are completely normal, you will receive your results only by: [place] (if you have MyChart) OR A paper copy in the mail If you have any lab test that is abnormal or we need to change your treatment, we will call you to review the results.   Testing/Procedures:  None ordered   Follow-Up: At [facility], you and your health needs are our priority.  As part of our continuing mission to provide you with exceptional heart care, we have created designated Provider Care Teams.  These Care Teams include your primary Cardiologist (physician) and Advanced Practice Providers (APPs -  Physician Assistants and Nurse Practitioners) who all work together to provide you with the care you need, when you need it.  We recommend signing up for the patient portal called "MyChart".  Sign up information is provided on this After Visit Summary.  MyChart is used to connect with patients for Virtual Visits (Telemedicine).  Patients are able to view lab/test results, encounter notes, upcoming appointments, etc.  Non-urgent messages can be sent to your provider as well.   To learn more about what you can do with MyChart, go to [email or web address].    Your next appointment:   6 - 8 week(s)  Provider:   You may see Kate Sable, MD or one of the following Advanced Practice Providers on your designated Care Team:   Murray Hodgkins, NP Christell Faith, PA-C Cadence Kathlen Mody, PA-C Gerrie Nordmann, NP

## 2022-04-24 ENCOUNTER — Inpatient Hospital Stay: Payer: Medicare Other

## 2022-04-24 VITALS — BP 118/54 | HR 72 | Temp 97.1°F

## 2022-04-24 DIAGNOSIS — Z08 Encounter for follow-up examination after completed treatment for malignant neoplasm: Secondary | ICD-10-CM | POA: Diagnosis not present

## 2022-04-24 DIAGNOSIS — D631 Anemia in chronic kidney disease: Secondary | ICD-10-CM | POA: Diagnosis not present

## 2022-04-24 DIAGNOSIS — Z8543 Personal history of malignant neoplasm of ovary: Secondary | ICD-10-CM | POA: Diagnosis not present

## 2022-04-24 DIAGNOSIS — N189 Chronic kidney disease, unspecified: Secondary | ICD-10-CM | POA: Diagnosis not present

## 2022-04-24 DIAGNOSIS — E871 Hypo-osmolality and hyponatremia: Secondary | ICD-10-CM

## 2022-04-24 DIAGNOSIS — I130 Hypertensive heart and chronic kidney disease with heart failure and stage 1 through stage 4 chronic kidney disease, or unspecified chronic kidney disease: Secondary | ICD-10-CM | POA: Diagnosis not present

## 2022-04-24 DIAGNOSIS — I5033 Acute on chronic diastolic (congestive) heart failure: Secondary | ICD-10-CM | POA: Diagnosis not present

## 2022-04-24 MED ORDER — SODIUM CHLORIDE 0.9 % IV SOLN
Freq: Once | INTRAVENOUS | Status: AC
  Filled 2022-04-24: qty 250

## 2022-04-24 MED ORDER — HEPARIN SOD (PORK) LOCK FLUSH 100 UNIT/ML IV SOLN
500.0000 [IU] | Freq: Once | INTRAVENOUS | Status: AC | PRN
  Administered 2022-04-24: 500 [IU]
  Filled 2022-04-24: qty 5

## 2022-04-24 MED ORDER — HEPARIN SOD (PORK) LOCK FLUSH 100 UNIT/ML IV SOLN
INTRAVENOUS | Status: AC
  Filled 2022-04-24: qty 5

## 2022-04-24 MED ORDER — SODIUM CHLORIDE 0.9 % IV SOLN
200.0000 mg | Freq: Once | INTRAVENOUS | Status: AC
  Administered 2022-04-24: 200 mg via INTRAVENOUS
  Filled 2022-04-24: qty 200

## 2022-04-28 ENCOUNTER — Other Ambulatory Visit: Payer: Self-pay

## 2022-04-28 ENCOUNTER — Telehealth: Payer: Self-pay | Admitting: Cardiology

## 2022-04-28 MED ORDER — HYDRALAZINE HCL 100 MG PO TABS: 100.0000 mg | ORAL_TABLET | Freq: Three times a day (TID) | ORAL | 0 refills | Status: DC

## 2022-04-28 NOTE — Telephone Encounter (Signed)
*  STAT* If patient is at the pharmacy, call can be transferred to refill team.   1. Which medications need to be refilled? (please list name of each medication and dose if known)   hydrALAZINE (APRESOLINE) 100 MG tablet   2. Which pharmacy/location (including street and city if local pharmacy) is medication to be sent to?  [id number] [address] [address] [place]  3. Do they need a 30 day or 90 day supply? 7-10 days  Patient stated she will need enough medication for 7-10 days [place]  Patient stated she has only 2 days worth of medication left. Patient stated this prescription will need to be called [facility] (not [facility]) as there will be two open prescriptions.

## 2022-04-28 NOTE — Telephone Encounter (Signed)
Disp Refills Start End   hydrALAZINE (APRESOLINE) 100 MG tablet 30 tablet 0 04/28/2022    Sig - Route: Take 1 tablet (100 mg total) by mouth 3 (three) times daily. - Oral   Sent to pharmacy as: hydrALAZINE (APRESOLINE) 100 MG tablet   E-Prescribing Status: Receipt confirmed by pharmacy (04/28/2022  9:09 AM EST)    Pharm[place]  [id number] [address]

## 2022-05-02 ENCOUNTER — Inpatient Hospital Stay: Payer: Medicare Other

## 2022-05-02 VITALS — BP 112/56 | HR 72 | Temp 97.2°F | Resp 18

## 2022-05-02 DIAGNOSIS — I130 Hypertensive heart and chronic kidney disease with heart failure and stage 1 through stage 4 chronic kidney disease, or unspecified chronic kidney disease: Secondary | ICD-10-CM | POA: Diagnosis not present

## 2022-05-02 DIAGNOSIS — N189 Chronic kidney disease, unspecified: Secondary | ICD-10-CM | POA: Diagnosis not present

## 2022-05-02 DIAGNOSIS — E871 Hypo-osmolality and hyponatremia: Secondary | ICD-10-CM

## 2022-05-02 DIAGNOSIS — Z8543 Personal history of malignant neoplasm of ovary: Secondary | ICD-10-CM | POA: Diagnosis not present

## 2022-05-02 DIAGNOSIS — D631 Anemia in chronic kidney disease: Secondary | ICD-10-CM | POA: Diagnosis not present

## 2022-05-02 DIAGNOSIS — I5033 Acute on chronic diastolic (congestive) heart failure: Secondary | ICD-10-CM | POA: Diagnosis not present

## 2022-05-02 DIAGNOSIS — Z08 Encounter for follow-up examination after completed treatment for malignant neoplasm: Secondary | ICD-10-CM | POA: Diagnosis not present

## 2022-05-02 MED ORDER — SODIUM CHLORIDE 0.9 % IV SOLN
Freq: Once | INTRAVENOUS | Status: AC
  Filled 2022-05-02: qty 250

## 2022-05-02 MED ORDER — HEPARIN SOD (PORK) LOCK FLUSH 100 UNIT/ML IV SOLN
500.0000 [IU] | Freq: Once | INTRAVENOUS | Status: AC | PRN
  Administered 2022-05-02: 500 [IU]
  Filled 2022-05-02: qty 5

## 2022-05-02 MED ORDER — SODIUM CHLORIDE 0.9 % IV SOLN
200.0000 mg | Freq: Once | INTRAVENOUS | Status: AC
  Administered 2022-05-02: 200 mg via INTRAVENOUS
  Filled 2022-05-02: qty 200

## 2022-05-02 MED ORDER — SODIUM CHLORIDE 0.9% FLUSH
10.0000 mL | Freq: Once | INTRAVENOUS | Status: AC | PRN
  Administered 2022-05-02: 10 mL
  Filled 2022-05-02: qty 10

## 2022-05-02 NOTE — Patient Instructions (Signed)
Iron Sucrose Injection What is this medication? IRON SUCROSE (EYE ern SOO krose) treats low levels of iron (iron deficiency anemia) in people with kidney disease. Iron is a mineral that plays an important role in making red blood cells, which carry oxygen from your lungs to the rest of your body. This medicine may be used for other purposes; ask your health care provider or pharmacist if you have questions. COMMON BRAND NAME(S): Venofer What should I tell my care team before I take this medication? They need to know if you have any of these conditions: Anemia not caused by low iron levels Heart disease High levels of iron in the blood Kidney disease Liver disease An unusual or allergic reaction to iron, other medications, foods, dyes, or preservatives Pregnant or trying to get pregnant Breastfeeding How should I use this medication? This medication is for infusion into a vein. It is given in a hospital or clinic setting. Talk to your care team about the use of this medication in children. While this medication may be prescribed for children as young as 2 years for selected conditions, precautions do apply. Overdosage: If you think you have taken too much of this medicine contact a poison control center or emergency room at once. NOTE: This medicine is only for you. Do not share this medicine with others. What if I miss a dose? Keep appointments for follow-up doses. It is important not to miss your dose. Call your care team if you are unable to keep an appointment. What may interact with this medication? Do not take this medication with any of the following: Deferoxamine Dimercaprol Other iron products This medication may also interact with the following: Chloramphenicol Deferasirox This list may not describe all possible interactions. Give your health care provider a list of all the medicines, herbs, non-prescription drugs, or dietary supplements you use. Also tell them if you smoke,  drink alcohol, or use illegal drugs. Some items may interact with your medicine. What should I watch for while using this medication? Visit your care team regularly. Tell your care team if your symptoms do not start to get better or if they get worse. You may need blood work done while you are taking this medication. You may need to follow a special diet. Talk to your care team. Foods that contain iron include: whole grains/cereals, dried fruits, beans, or peas, leafy green vegetables, and organ meats (liver, kidney). What side effects may I notice from receiving this medication? Side effects that you should report to your care team as soon as possible: Allergic reactions--skin rash, itching, hives, swelling of the face, lips, tongue, or throat Low blood pressure--dizziness, feeling faint or lightheaded, blurry vision Shortness of breath Side effects that usually do not require medical attention (report to your care team if they continue or are bothersome): Flushing Headache Joint pain Muscle pain Nausea Pain, redness, or irritation at injection site This list may not describe all possible side effects. Call your doctor for medical advice about side effects. You may report side effects to FDA at 1-800-FDA-1088. Where should I keep my medication? This medication is given in a hospital or clinic and will not be stored at home. NOTE: This sheet is a summary. It may not cover all possible information. If you have questions about this medicine, talk to your doctor, pharmacist, or health care provider.  2023 Elsevier/Gold Standard (2020-07-08 00:00:00)

## 2022-05-08 NOTE — Progress Notes (Signed)
Hill ID: Katie Hill, female    DOB: 06-06-1950, 72 y.o.   MRN: [id number]  HPI  Katie Hill is a 72 y/o female with a history of ovarian cancer, HTN, CKD, anemia and chronic heart failure.   Echo report from 12/23/21 reviewed and showed an EF of 55% along with mild MR and mild/moderate  Admitted 01/20/22 due to acute diastolic congestive heart failure, bilateral effusions and acute hypoxic respiratory failure. Treated with IV lasix with transition to oral diuretics. Cardiology and nephrology consults obtained. Antihypertensive meds added. Weaned off of oxygen. Carvedilol held for transient bradycardia but then resumed by cardiology. Hypokalemia corrected. Renal ultrasound negative. Discharged after 3 days.   Katie Hill presents today for a follow-up visit with a chief complaint of minimal fatigue upon moderate exertion. Describes this as chronic in nature. Has associated pedal edema (much improved), headaches & intermittent leg pain along with this. Denies any difficulty sleeping, abdominal distention, palpitations, chest pain, wheezing, shortness of breath, cough, dizziness or weight gain.   Tolerating jardiance without any known side effects.   Past Medical History:  Diagnosis Date   (HFpEF) heart failure with preserved ejection fraction (HCC)    a. 12/2021 Echo: EF 55%, no rwma, nl RV fxn, mild MR, mild-mod TR. Triv AI.   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   Pelvic mass in female    Past Surgical History:  Procedure Laterality Date   APPENDECTOMY     LAPAROSCOPY N/A 03/14/2017   Procedure: LAPAROSCOPY OPERATIVE;  Surgeon: Leida Lauth, MD;  Location: [facility];  Service: Gynecology;  Laterality: N/A;   LAPAROTOMY N/A 03/14/2017   Procedure: LAPAROTOMY;   Surgeon: Leida Lauth, MD;  Location: [facility];  Service: Gynecology;  Laterality: N/A;   LYMPH NODE DISSECTION N/A 03/14/2017   Procedure: LYMPH NODE DISSECTION;  Surgeon: Leida Lauth, MD;  Location: [facility];  Service: Gynecology;  Laterality: N/A;   OMENTECTOMY N/A 03/14/2017   Procedure: OMENTECTOMY;  Surgeon: Leida Lauth, MD;  Location: [facility];  Service: Gynecology;  Laterality: N/A;   PORTA CATH INSERTION N/A 11/27/2016   Procedure: Shelda Pal Cath Insertion;  Surgeon: Annice Needy, MD;  Location: [facility];  Service: Cardiovascular;  Laterality: N/A;   Family History  Problem Relation Age of Onset   Throat cancer Cousin    Throat cancer Cousin    Leukemia Cousin    Social History   Tobacco Use   Smoking status: Never   Smokeless tobacco: Never  Substance Use Topics   Alcohol use: Not Currently   Allergies  Allergen Reactions   Omeprazole Rash   Prior to Admission medications   Medication Sig Start Date End Date Taking? Authorizing Provider  aspirin 81 MG chewable tablet Chew 1 tablet (81 mg total) by mouth daily. 01/24/22  Yes Wieting, Richard, MD  Cyanocobalamin (B-12) 1000 MCG CAPS Take 1,000 mg by mouth daily.   Yes [provider]  empagliflozin (JARDIANCE) 10 MG TABS tablet Take 1 tablet (10 mg total) by mouth daily before breakfast. 03/28/22  Yes Clarisa Kindred A, FNP  feeding supplement (ENSURE ENLIVE / ENSURE PLUS) LIQD Take 237 mLs by mouth 3 (three) times daily between meals. 01/23/22  Yes Alford Highland, MD  Ferrous Sulfate (IRON) 325 (65 Fe) MG TABS Take 325 mg by mouth daily.   Yes [provider]  furosemide (LASIX) 40 MG tablet Take one tablet daily (can take extra tab in afternoon if gain 3 lbs in a day or 5 lbs in a week) Hill taking differently: Take 40 mg by mouth 2 (two) times daily. Take one tablet daily (can take extra tab in afternoon if gain 3 lbs in a day or 5 lbs in a week) 01/30/22  Yes Clarisa Kindred A, FNP   hydrALAZINE (APRESOLINE) 100 MG tablet Take 1 tablet (100 mg total) by mouth 3 (three) times daily. 04/28/22  Yes Debbe Odea, MD  levothyroxine (SYNTHROID) 100 MCG tablet Take 1 tablet (100 mcg total) by mouth daily. 04/25/21  Yes Masoud, Renda Rolls, MD  lidocaine-prilocaine (EMLA) cream Apply 1 application topically as needed. Apply small amount to port site at least 1 hour prior to it being accessed, cover with plastic wrap 12/01/20  Yes Rickard Patience, MD  losartan (COZAAR) 100 MG tablet Take 1 tablet (100 mg total) by mouth daily. 01/30/22  Yes Clarisa Kindred A, FNP  metoprolol succinate (TOPROL XL) 25 MG 24 hr tablet Take 1 tablet (25 mg total) by mouth at bedtime. 01/30/22  Yes Shauniece Kwan, Inetta Fermo A, FNP  spironolactone (ALDACTONE) 25 MG tablet Take 1 tablet (25 mg total) by mouth daily. 01/30/22  Yes Delma Freeze, FNP  Multiple Vitamin (MULTIVITAMIN WITH MINERALS) TABS tablet Take 1 tablet by mouth daily. Hill not taking: Reported on 04/21/2022 01/23/22   Alford Highland, MD   Review of Systems  Constitutional:  Positive for fatigue (minimal). Negative for appetite change.  HENT:  Negative for congestion, postnasal drip and sore throat.   Eyes: Negative.   Respiratory:  Negative for cough, chest tightness, shortness of breath and wheezing.   Cardiovascular:  Positive for leg swelling (ankle but leg swelling much better). Negative for chest pain and palpitations.  Gastrointestinal:  Negative for abdominal distention and abdominal pain.  Endocrine: Negative.   Genitourinary: Negative.   Musculoskeletal:  Positive for arthralgias (legs/ feet). Negative for neck pain.  Skin: Negative.   Allergic/Immunologic: Negative.   Neurological:  Positive for headaches (in Katie mornings). Negative for dizziness and light-headedness.  Hematological:  Negative for adenopathy. Does not bruise/bleed easily.  Psychiatric/Behavioral:  Negative for dysphoric mood and sleep disturbance (sleeping on 1-2 pillows). Katie  Hill is not nervous/anxious.    Vitals:   05/09/22 1048  BP: (!) 146/78  Pulse: 63  Resp: 16  SpO2: 100%  Weight: 110 lb 4 oz (50 kg)   Wt Readings from Last 3 Encounters:  05/09/22 110 lb 4 oz (50 kg)  04/21/22 111 lb 3.2 oz (50.4 kg)  04/14/22 115 lb 1.6 oz (52.2 kg)   Lab Results  Component Value Date   CREATININE 1.13 (H) 04/07/2022   CREATININE 1.07 (H) 03/10/2022   CREATININE 0.95 02/03/2022   Physical Exam Vitals and nursing note reviewed. Exam conducted with a chaperone present (husband).  Constitutional:      Appearance: Normal appearance.  HENT:     Head: Normocephalic and atraumatic.  Cardiovascular:     Rate and Rhythm: Normal rate and regular rhythm.  Pulmonary:     Effort: Pulmonary effort is normal. No respiratory distress.     Breath sounds: No wheezing or rales.  Abdominal:     General: There is no distension.     Palpations: Abdomen is soft.  Musculoskeletal:  General: No tenderness.     Cervical back: Normal range of motion and neck supple.     Right lower leg: Edema (trace pitting) present.     Left lower leg: Edema (trace pitting) present.  Skin:    General: Skin is warm and dry.  Neurological:     General: No focal deficit present.     Mental Status: Katie Hill is alert and oriented to person, place, and time.  Psychiatric:        Mood and Affect: Mood normal.        Behavior: Behavior normal.        Thought Content: Thought content normal.   Assessment & Plan:  1: Chronic heart failure with preserved ejection fraction without structural changes- - NYHA class II - euvolemic today - weighing daily; instructed to call for an overnight weight gain of > 2 pounds or a weekly weight gain of > 5 pounds - weight down 4 pounds from last visit here 6 weeks ago - adding salt but is trying to use less; reviewed Katie importance of not adding any salt and to try using Mrs Sharilyn Sites seasoning instead of salt - drinking 2 cups of coffee and 3 cups water  daily; reviewed importance of keeping daily fluid intake to 60-64 ounces - saw cardiology (Agbor-Etang) 04/21/22 - check BMP today as jardiance added at last visit; jardiance RX sent [facility] - will stop losartan and begin entresto 49/51mg  BID beginning tomorrow as Katie Hill already took losartan today - BMP next visit as well with change to entresto - at next visit, will sent entresto RX to [place] - BNP 02/03/22 was 2073.3 - PharmD reconciled meds w/ Hill and husband  2: HTN with CKD- - BP 146/78; changing losartan to entresto - saw PCP Letitia Libra) 01/31/22 - BMP 04/07/22 reviewed and showed sodium 135, potassium 4.1, creatinine 1.14 & GFR 52 - saw nephrology Thedore Mins) 04/17/22  3: Ovarian cancer- - saw oncology Cathie Hoops) 04/14/22  4: Anemia - hemoglobin 04/07/22 was 7.9 - venofer infusion done 05/02/22  5: Lymphedema- - resolved   Medication list reviewed.   Return in 3 weeks, sooner if needed.

## 2022-05-09 ENCOUNTER — Encounter: Payer: Self-pay | Admitting: Family

## 2022-05-09 ENCOUNTER — Other Ambulatory Visit
Admission: RE | Admit: 2022-05-09 | Discharge: 2022-05-09 | Disposition: A | Discharge status: Home / Self Care | Payer: Medicare Other | Source: Ambulatory Visit | Attending: Family | Admitting: Family

## 2022-05-09 ENCOUNTER — Other Ambulatory Visit (HOSPITAL_COMMUNITY): Payer: Self-pay

## 2022-05-09 ENCOUNTER — Encounter: Payer: Self-pay | Admitting: Pharmacy Technician

## 2022-05-09 ENCOUNTER — Encounter: Payer: Self-pay | Admitting: Oncology

## 2022-05-09 ENCOUNTER — Ambulatory Visit (HOSPITAL_BASED_OUTPATIENT_CLINIC_OR_DEPARTMENT_OTHER): Payer: Medicare Other | Admitting: Family

## 2022-05-09 VITALS — BP 146/78 | HR 63 | Resp 16 | Wt 110.2 lb

## 2022-05-09 DIAGNOSIS — I890 Lymphedema, not elsewhere classified: Secondary | ICD-10-CM

## 2022-05-09 DIAGNOSIS — I10 Essential (primary) hypertension: Secondary | ICD-10-CM | POA: Diagnosis not present

## 2022-05-09 DIAGNOSIS — I5032 Chronic diastolic (congestive) heart failure: Secondary | ICD-10-CM | POA: Diagnosis not present

## 2022-05-09 DIAGNOSIS — C569 Malignant neoplasm of unspecified ovary: Secondary | ICD-10-CM

## 2022-05-09 DIAGNOSIS — D6481 Anemia due to antineoplastic chemotherapy: Secondary | ICD-10-CM

## 2022-05-09 LAB — BASIC METABOLIC PANEL
Anion gap: 9 (ref 5–15)
BUN: 60 mg/dL — ABNORMAL HIGH (ref 8–23)
CO2: 20 mmol/L — ABNORMAL LOW (ref 22–32)
Calcium: 9.7 mg/dL (ref 8.9–10.3)
Chloride: 103 mmol/L (ref 98–111)
Creatinine, Ser: 1.6 mg/dL — ABNORMAL HIGH (ref 0.44–1.00)
GFR, Estimated: 34 mL/min — ABNORMAL LOW (ref >60)
Glucose, Bld: 87 mg/dL (ref 70–99)
Potassium: 4.7 mmol/L (ref 3.5–5.1)
Sodium: 132 mmol/L — ABNORMAL LOW (ref 135–145)

## 2022-05-09 MED ORDER — SACUBITRIL-VALSARTAN 49-51 MG PO TABS: 1.0000 | ORAL_TABLET | Freq: Two times a day (BID) | ORAL | 5 refills | Status: DC

## 2022-05-09 MED ORDER — EMPAGLIFLOZIN 10 MG PO TABS: 10.0000 mg | ORAL_TABLET | Freq: Every day | ORAL | 3 refills | Status: AC

## 2022-05-09 NOTE — Patient Instructions (Addendum)
Continue weighing daily and call for an overnight weight gain of 3 pounds or more or a weekly weight gain of more than 5 pounds.   If you receive a satisfaction survey regarding the [facility], please take the time to fill it out. This way we can continue to provide excellent care and make any changes that need to be made.    Stop taking losartan and you will begin entresto 49/'51mg'$  as 1 tablet every morning and 1 tablet every evening. I have sent the entresto prescription to [facility]

## 2022-05-09 NOTE — Progress Notes (Signed)
[place] - PHARMACIST COUNSELING NOTE  Guideline-Directed Medical Therapy/Evidence Based Medicine  ACE/ARB/ARNI: Losartan 100 mg daily Beta Blocker: Metoprolol succinate 25 mg daily Aldosterone Antagonist: Spironolactone 25 mg daily Diuretic:  Furosemide 40 mg twice daily SGLT2i: Empagliflozin 10 mg daily  Adherence Assessment  Do you ever forget to take your medication? '[]'$ Yes '[x]'$ No  Do you ever skip doses due to side effects? '[]'$ Yes '[x]'$ No  Do you have trouble affording your medicines? '[]'$ Yes '[x]'$ No  Are you ever unable to pick up your medication due to transportation difficulties? '[]'$ Yes '[x]'$ No  Do you ever stop taking your medications because you don't believe they are helping? '[]'$ Yes '[x]'$ No  Do you check your weight daily? '[x]'$ Yes '[]'$ No   Adherence strategy: Does not use a pill box  Barriers to obtaining medications: None  Vital signs: HR 63, BP 146/78, weight (pounds) 110 ECHO: Date 12/2021, EF 55%, notes Mild MR     Latest Ref Rng & Units 05/09/2022   11:44 AM 04/07/2022    9:58 AM 03/10/2022    9:02 AM  BMP  Glucose 70 - 99 mg/dL 87  105  125   BUN 8 - 23 mg/dL 60  26  26   Creatinine 0.44 - 1.00 mg/dL 1.60  1.13  1.07   Sodium 135 - 145 mmol/L 132  135  132   Potassium 3.5 - 5.1 mmol/L 4.7  4.1  4.0   Chloride 98 - 111 mmol/L 103  108  102   CO2 22 - 32 mmol/L '20  22  22   '$ Calcium 8.9 - 10.3 mg/dL 9.7  9.0  9.4     Past Medical History:  Diagnosis Date   (HFpEF) heart failure with preserved ejection fraction ([place])    a. 12/2021 Echo: EF 55%, no rwma, nl RV fxn, mild MR, mild-mod TR. Triv AI.   Anemia    CHF (congestive heart failure) (HCC)    Chronic kidney disease    Dysrhythmia    Essential hypertension    Genetic testing 03/28/2017   Multi-Cancer panel (83 genes) @ [facility] - No pathogenic mutations detected   High grade ovarian cancer ([place]) 11/20/2016   Pelvic mass in female     ASSESSMENT 72 year old female  with PMH HTN, CKD3 w/ proteinuria, ovarian cancer s/p hysterectomy and bilateral salpingo-oophorectomy who presents to the HF clinic for follow-up. Most recent ECHO in 12/2021 shows EF 55%. Regarding GDMT, patient takes losartan 100 mg daily, Jardiance 10 mg daily, Toprol XL 25 mg daily, and spironolactone 25 mg daily. Patient also takes furosemide 40 mg twice daily, which was recently increased. Since the increase, patient is not having fluid retention and in her own words, she is doing better on the increased furosemide dose. Patient's blood pressure is elevated again today, at 146/78 (past two readings before today were also elevated, 166/88 and 181/87). Patient says that at home her SBP runs in the upper 130s to 140s.  Recent ED Visit (past 6 months): Date - 01/2022, [facility] - [facility]   PLAN CHF/HTN Recommend switching losartan to Entresto 49/51 mg twice daily Unable to verify copay but patient receives medicines through mail order from [place] and does not pay any copays Counseled patient on Entresto and provided her with 30-day free coupon to use at local pharmacy for [facility] to give time for [place] mail order to send her medications Recommend obtaining follow-up BMP in 3-4 weeks after starting Entresto Recommend checking BMP today  to assess impact of increased furosemide dose on electrolytes and kidney function Continue Jardiance, Toprol XL, spironolactone, and hydralazine   Time spent: 20 minutes  Will M. Ouida Sills, PharmD PGY-1 Pharmacy Resident 05/09/2022 1:31 PM    Current Outpatient Medications:    aspirin 81 MG chewable tablet, Chew 1 tablet (81 mg total) by mouth daily., Disp: 30 tablet, Rfl: 0   Cyanocobalamin (B-12) 1000 MCG CAPS, Take 1,000 mg by mouth daily., Disp: , Rfl:    empagliflozin (JARDIANCE) 10 MG TABS tablet, Take 1 tablet (10 mg total) by mouth daily before breakfast., Disp: 90 tablet, Rfl: 3   feeding supplement (ENSURE ENLIVE / ENSURE PLUS) LIQD, Take 237 mLs by mouth 3  (three) times daily between meals., Disp: 14220 mL, Rfl: 0   Ferrous Sulfate (IRON) 325 (65 Fe) MG TABS, Take 325 mg by mouth daily., Disp: , Rfl:    furosemide (LASIX) 40 MG tablet, Take one tablet daily (can take extra tab in afternoon if gain 3 lbs in a day or 5 lbs in a week) (Patient taking differently: Take 40 mg by mouth 2 (two) times daily. Take one tablet daily (can take extra tab in afternoon if gain 3 lbs in a day or 5 lbs in a week)), Disp: 90 tablet, Rfl: 3   hydrALAZINE (APRESOLINE) 100 MG tablet, Take 1 tablet (100 mg total) by mouth 3 (three) times daily., Disp: 30 tablet, Rfl: 0   levothyroxine (SYNTHROID) 100 MCG tablet, Take 1 tablet (100 mcg total) by mouth daily., Disp: 90 tablet, Rfl: 3   lidocaine-prilocaine (EMLA) cream, Apply 1 application topically as needed. Apply small amount to port site at least 1 hour prior to it being accessed, cover with plastic wrap, Disp: 30 g, Rfl: 1   metoprolol succinate (TOPROL XL) 25 MG 24 hr tablet, Take 1 tablet (25 mg total) by mouth at bedtime., Disp: 90 tablet, Rfl: 3   Multiple Vitamin (MULTIVITAMIN WITH MINERALS) TABS tablet, Take 1 tablet by mouth daily. (Patient not taking: Reported on 04/21/2022), Disp: , Rfl:    sacubitril-valsartan (ENTRESTO) 49-51 MG, Take 1 tablet by mouth 2 (two) times daily., Disp: 60 tablet, Rfl: 5   spironolactone (ALDACTONE) 25 MG tablet, Take 1 tablet (25 mg total) by mouth daily., Disp: 90 tablet, Rfl: 3 No current facility-administered medications for this visit.  Facility-Administered Medications Ordered in Other Visits:    pegfilgrastim (NEULASTA ONPRO KIT) 6 MG/0.6ML injection, , , ,     DRUGS TO CAUTION IN HEART FAILURE  Drug or Class Mechanism  Analgesics NSAIDs COX-2 inhibitors Glucocorticoids  Sodium and water retention, increased systemic vascular resistance, decreased response to diuretics   Diabetes Medications Metformin Thiazolidinediones Rosiglitazone (Avandia) Pioglitazone  (Actos) DPP4 Inhibitors Saxagliptin (Onglyza) Sitagliptin (Januvia)   Lactic acidosis Possible calcium channel blockade   Unknown  Antiarrhythmics Class I  Flecainide Disopyramide Class III Sotalol Other Dronedarone  Negative inotrope, proarrhythmic   Proarrhythmic, beta blockade  Negative inotrope  Antihypertensives Alpha Blockers Doxazosin Calcium Channel Blockers Diltiazem Verapamil Nifedipine Central Alpha Adrenergics Moxonidine Peripheral Vasodilators Minoxidil  Increases renin and aldosterone  Negative inotrope    Possible sympathetic withdrawal  Unknown  Anti-infective Itraconazole Amphotericin B  Negative inotrope Unknown  Hematologic Anagrelide Cilostazol   Possible inhibition of PD IV Inhibition of PD III causing arrhythmias  Neurologic/Psychiatric Stimulants Anti-Seizure Drugs Carbamazepine Pregabalin Antidepressants Tricyclics Citalopram Parkinsons Bromocriptine Pergolide Pramipexole Antipsychotics Clozapine Antimigraine Ergotamine Methysergide Appetite suppressants Bipolar Lithium  Peripheral alpha and beta agonist activity  Negative inotrope  and chronotrope Calcium channel blockade  Negative inotrope, proarrhythmic Dose-dependent QT prolongation  Excessive serotonin activity/valvular damage Excessive serotonin activity/valvular damage Unknown  IgE mediated hypersensitivy, calcium channel blockade  Excessive serotonin activity/valvular damage Excessive serotonin activity/valvular damage Valvular damage  Direct myofibrillar degeneration, adrenergic stimulation  Antimalarials Chloroquine Hydroxychloroquine Intracellular inhibition of lysosomal enzymes  Urologic Agents Alpha Blockers Doxazosin Prazosin Tamsulosin Terazosin  Increased renin and aldosterone  Adapted from Page Carleene Overlie, et al. "Drugs That May Cause or Exacerbate Heart Failure: A Scientific Statement from the American Heart  Association."  Circulation 2016; 134:e32-e69. DOI: 10.1161/CIR.0000000000000426   MEDICATION ADHERENCES TIPS AND STRATEGIES Taking medication as prescribed improves patient outcomes in heart failure (reduces hospitalizations, improves symptoms, increases survival) Side effects of medications can be managed by decreasing doses, switching agents, stopping drugs, or adding additional therapy. Please let someone in the [facility] know if you have having bothersome side effects so we can modify your regimen. Do not alter your medication regimen without talking to [place].  Medication reminders can help patients remember to take drugs on time. If you are missing or forgetting doses you can try linking behaviors, using pill boxes, or an electronic reminder like an alarm on your phone or an app. Some people can also get automated phone calls as medication reminders.

## 2022-05-11 ENCOUNTER — Ambulatory Visit
Admission: RE | Admit: 2022-05-11 | Discharge: 2022-05-11 | Disposition: A | Discharge status: Home / Self Care | Payer: Medicare Other | Source: Ambulatory Visit | Attending: Oncology | Admitting: Oncology

## 2022-05-11 DIAGNOSIS — R911 Solitary pulmonary nodule: Secondary | ICD-10-CM | POA: Diagnosis not present

## 2022-05-11 DIAGNOSIS — C569 Malignant neoplasm of unspecified ovary: Secondary | ICD-10-CM

## 2022-05-11 DIAGNOSIS — N3289 Other specified disorders of bladder: Secondary | ICD-10-CM | POA: Diagnosis not present

## 2022-05-11 DIAGNOSIS — R1900 Intra-abdominal and pelvic swelling, mass and lump, unspecified site: Secondary | ICD-10-CM | POA: Diagnosis not present

## 2022-05-15 ENCOUNTER — Inpatient Hospital Stay: Payer: Medicare Other

## 2022-05-15 ENCOUNTER — Inpatient Hospital Stay: Payer: Medicare Other | Attending: Oncology

## 2022-05-15 VITALS — BP 131/65 | HR 63 | Temp 97.4°F | Resp 16

## 2022-05-15 DIAGNOSIS — D631 Anemia in chronic kidney disease: Secondary | ICD-10-CM | POA: Insufficient documentation

## 2022-05-15 DIAGNOSIS — N189 Chronic kidney disease, unspecified: Secondary | ICD-10-CM | POA: Diagnosis not present

## 2022-05-15 DIAGNOSIS — I509 Heart failure, unspecified: Secondary | ICD-10-CM | POA: Diagnosis not present

## 2022-05-15 DIAGNOSIS — E871 Hypo-osmolality and hyponatremia: Secondary | ICD-10-CM

## 2022-05-15 DIAGNOSIS — R600 Localized edema: Secondary | ICD-10-CM | POA: Diagnosis not present

## 2022-05-15 DIAGNOSIS — E538 Deficiency of other specified B group vitamins: Secondary | ICD-10-CM | POA: Insufficient documentation

## 2022-05-15 DIAGNOSIS — C561 Malignant neoplasm of right ovary: Secondary | ICD-10-CM | POA: Insufficient documentation

## 2022-05-15 DIAGNOSIS — I10 Essential (primary) hypertension: Secondary | ICD-10-CM | POA: Diagnosis not present

## 2022-05-15 DIAGNOSIS — C569 Malignant neoplasm of unspecified ovary: Secondary | ICD-10-CM

## 2022-05-15 DIAGNOSIS — R809 Proteinuria, unspecified: Secondary | ICD-10-CM | POA: Diagnosis not present

## 2022-05-15 LAB — COMPREHENSIVE METABOLIC PANEL
ALT: 14 U/L (ref 0–44)
AST: 20 U/L (ref 15–41)
Albumin: 3.9 g/dL (ref 3.5–5.0)
Alkaline Phosphatase: 57 U/L (ref 38–126)
Anion gap: 6 (ref 5–15)
BUN: 41 mg/dL — ABNORMAL HIGH (ref 8–23)
CO2: 19 mmol/L — ABNORMAL LOW (ref 22–32)
Calcium: 8.9 mg/dL (ref 8.9–10.3)
Chloride: 106 mmol/L (ref 98–111)
Creatinine, Ser: 1.4 mg/dL — ABNORMAL HIGH (ref 0.44–1.00)
GFR, Estimated: 40 mL/min — ABNORMAL LOW (ref >60)
Glucose, Bld: 115 mg/dL — ABNORMAL HIGH (ref 70–99)
Potassium: 4.8 mmol/L (ref 3.5–5.1)
Sodium: 131 mmol/L — ABNORMAL LOW (ref 135–145)
Total Bilirubin: 0.2 mg/dL — ABNORMAL LOW (ref 0.3–1.2)
Total Protein: 7.1 g/dL (ref 6.5–8.1)

## 2022-05-15 LAB — CBC WITH DIFFERENTIAL/PLATELET
Abs Immature Granulocytes: 0.01 10*3/uL (ref 0.00–0.07)
Basophils Absolute: 0 10*3/uL (ref 0.0–0.1)
Basophils Relative: 1 %
Eosinophils Absolute: 0.1 10*3/uL (ref 0.0–0.5)
Eosinophils Relative: 2 %
HCT: 24.4 % — ABNORMAL LOW (ref 36.0–46.0)
Hemoglobin: 7.9 g/dL — ABNORMAL LOW (ref 12.0–15.0)
Immature Granulocytes: 0 %
Lymphocytes Relative: 26 %
Lymphs Abs: 0.9 10*3/uL (ref 0.7–4.0)
MCH: 32.6 pg (ref 26.0–34.0)
MCHC: 32.4 g/dL (ref 30.0–36.0)
MCV: 100.8 fL — ABNORMAL HIGH (ref 80.0–100.0)
Monocytes Absolute: 0.4 10*3/uL (ref 0.1–1.0)
Monocytes Relative: 10 %
Neutro Abs: 2.2 10*3/uL (ref 1.7–7.7)
Neutrophils Relative %: 61 %
Platelets: 127 10*3/uL — ABNORMAL LOW (ref 150–400)
RBC: 2.42 MIL/uL — ABNORMAL LOW (ref 3.87–5.11)
RDW: 13 % (ref 11.5–15.5)
WBC: 3.6 10*3/uL — ABNORMAL LOW (ref 4.0–10.5)
nRBC: 0 % (ref 0.0–0.2)

## 2022-05-15 LAB — FERRITIN: Ferritin: 373 ng/mL — ABNORMAL HIGH (ref 11–307)

## 2022-05-15 LAB — IRON AND TIBC
Iron: 94 ug/dL (ref 28–170)
Saturation Ratios: 30 % (ref 10.4–31.8)
TIBC: 311 ug/dL (ref 250–450)
UIBC: 217 ug/dL

## 2022-05-15 LAB — RETIC PANEL
Immature Retic Fract: 2.7 % (ref 2.3–15.9)
RBC.: 2.37 MIL/uL — ABNORMAL LOW (ref 3.87–5.11)
Retic Count, Absolute: 25.4 10*3/uL (ref 19.0–186.0)
Retic Ct Pct: 1.1 % (ref 0.4–3.1)
Reticulocyte Hemoglobin: 33.9 pg (ref >27.9)

## 2022-05-15 MED ORDER — CYANOCOBALAMIN 1000 MCG/ML IJ SOLN
1000.0000 ug | INTRAMUSCULAR | Status: DC
  Administered 2022-05-15: 1000 ug via INTRAMUSCULAR
  Filled 2022-05-15: qty 1

## 2022-05-15 MED ORDER — SODIUM CHLORIDE 0.9 % IV SOLN
Freq: Once | INTRAVENOUS | Status: AC
  Filled 2022-05-15: qty 250

## 2022-05-15 MED ORDER — SODIUM CHLORIDE 0.9 % IV SOLN
200.0000 mg | Freq: Once | INTRAVENOUS | Status: AC
  Administered 2022-05-15: 200 mg via INTRAVENOUS
  Filled 2022-05-15: qty 200

## 2022-05-16 LAB — CA 125: Cancer Antigen (CA) 125: 28 U/mL (ref 0.0–38.1)

## 2022-05-18 DIAGNOSIS — E875 Hyperkalemia: Secondary | ICD-10-CM | POA: Diagnosis not present

## 2022-05-18 DIAGNOSIS — N1831 Chronic kidney disease, stage 3a: Secondary | ICD-10-CM | POA: Diagnosis not present

## 2022-05-18 DIAGNOSIS — I10 Essential (primary) hypertension: Secondary | ICD-10-CM | POA: Diagnosis not present

## 2022-05-18 DIAGNOSIS — R809 Proteinuria, unspecified: Secondary | ICD-10-CM | POA: Diagnosis not present

## 2022-05-18 DIAGNOSIS — R600 Localized edema: Secondary | ICD-10-CM | POA: Diagnosis not present

## 2022-05-18 DIAGNOSIS — N2581 Secondary hyperparathyroidism of renal origin: Secondary | ICD-10-CM | POA: Diagnosis not present

## 2022-05-18 DIAGNOSIS — I129 Hypertensive chronic kidney disease with stage 1 through stage 4 chronic kidney disease, or unspecified chronic kidney disease: Secondary | ICD-10-CM | POA: Diagnosis not present

## 2022-05-30 ENCOUNTER — Encounter: Payer: Self-pay | Admitting: Oncology

## 2022-05-30 ENCOUNTER — Inpatient Hospital Stay: Payer: Medicare Other

## 2022-05-30 ENCOUNTER — Inpatient Hospital Stay (HOSPITAL_BASED_OUTPATIENT_CLINIC_OR_DEPARTMENT_OTHER): Payer: Medicare Other | Admitting: Oncology

## 2022-05-30 VITALS — BP 134/73 | HR 63 | Temp 96.5°F | Resp 16 | Wt 109.6 lb

## 2022-05-30 DIAGNOSIS — E538 Deficiency of other specified B group vitamins: Secondary | ICD-10-CM | POA: Diagnosis not present

## 2022-05-30 DIAGNOSIS — I5031 Acute diastolic (congestive) heart failure: Secondary | ICD-10-CM | POA: Diagnosis not present

## 2022-05-30 DIAGNOSIS — N189 Chronic kidney disease, unspecified: Secondary | ICD-10-CM | POA: Diagnosis not present

## 2022-05-30 DIAGNOSIS — I509 Heart failure, unspecified: Secondary | ICD-10-CM | POA: Diagnosis not present

## 2022-05-30 DIAGNOSIS — C561 Malignant neoplasm of right ovary: Secondary | ICD-10-CM | POA: Diagnosis not present

## 2022-05-30 DIAGNOSIS — D631 Anemia in chronic kidney disease: Secondary | ICD-10-CM | POA: Diagnosis not present

## 2022-05-30 DIAGNOSIS — D539 Nutritional anemia, unspecified: Secondary | ICD-10-CM

## 2022-05-30 DIAGNOSIS — C569 Malignant neoplasm of unspecified ovary: Secondary | ICD-10-CM

## 2022-05-30 MED ORDER — ONDANSETRON HCL 4 MG PO TABS: 4.0000 mg | ORAL_TABLET | Freq: Three times a day (TID) | ORAL | 3 refills | Status: AC | PRN

## 2022-05-30 NOTE — Assessment & Plan Note (Addendum)
No significant improvement after holding chemotherapy.  No significant improvement after IV Venofer treatments and B12 injections. There is likely a component of anemia secondary to chronic kidney disease. Currently she is stable. I will hold off erythropoietin replacement therapy due to its angiogenesis effects. In the future, consider erythropoietin therapy if anemia needs to be improved for additional treatments.  B12 is close to low normal.  Status post B12 1000 mcg IM injection x 1, will repeat monthly B12 injection x 2

## 2022-05-30 NOTE — Progress Notes (Unsigned)
Patient ID: Katie Hill, female    DOB: Dec 31, 1950, 72 y.o.   MRN: [id number]  HPI  Katie Hill is a 72 y/o female with a history of ovarian cancer, HTN, CKD, anemia and chronic heart failure.   Echo report from 12/23/21 reviewed and showed an EF of 55% along with mild MR and mild/moderate  Admitted 01/20/22 due to acute diastolic congestive heart failure, bilateral effusions and acute hypoxic respiratory failure. Treated with IV lasix with transition to oral diuretics. Cardiology and nephrology consults obtained. Antihypertensive meds added. Weaned off of oxygen. Carvedilol held for transient bradycardia but then resumed by cardiology. Hypokalemia corrected. Renal ultrasound negative. Discharged after 3 days.   She presents today for a HF follow-up visit with a chief complaint of minimal fatigue with moderate exertion. Describes this as chronic in nature. Has associated morning headaches and chronic joint aches along with this. Denies any difficulty sleeping, dizziness, abdominal distention, palpitations, pedal edema, chest pain, wheezing, shortness of breath, cough or weight gain.   Her spironolactone was decreased to 3x/ week due to rising potassium level.  Past Medical History:  Diagnosis Date   (HFpEF) heart failure with preserved ejection fraction ([place])    a. 12/2021 Echo: EF 55%, no rwma, nl RV fxn, mild MR, mild-mod TR. Triv AI.   Anemia    CHF (congestive heart failure) ([facility])    Chronic kidney disease    Dysrhythmia    Essential hypertension    Genetic testing 03/28/2017   Multi-Cancer panel (83 genes) @ [facility] - No pathogenic mutations detected   High grade ovarian cancer ([place]) 11/20/2016   Pelvic mass in female    Past Surgical History:  Procedure Laterality Date   APPENDECTOMY     LAPAROSCOPY N/A 03/14/2017   Procedure: LAPAROSCOPY OPERATIVE;  Surgeon: Mellody Drown, MD;  Location: [facility];  Service: Gynecology;  Laterality: N/A;   LAPAROTOMY N/A 03/14/2017    Procedure: LAPAROTOMY;  Surgeon: Mellody Drown, MD;  Location: [facility];  Service: Gynecology;  Laterality: N/A;   LYMPH NODE DISSECTION N/A 03/14/2017   Procedure: LYMPH NODE DISSECTION;  Surgeon: Mellody Drown, MD;  Location: [facility];  Service: Gynecology;  Laterality: N/A;   OMENTECTOMY N/A 03/14/2017   Procedure: OMENTECTOMY;  Surgeon: Mellody Drown, MD;  Location: [facility];  Service: Gynecology;  Laterality: N/A;   PORTA CATH INSERTION N/A 11/27/2016   Procedure: Glori Luis Cath Insertion;  Surgeon: Algernon Huxley, MD;  Location: [facility];  Service: Cardiovascular;  Laterality: N/A;   Family History  Problem Relation Age of Onset   Throat cancer Cousin    Throat cancer Cousin    Leukemia Cousin    Social History   Tobacco Use   Smoking status: Never   Smokeless tobacco: Never  Substance Use Topics   Alcohol use: Not Currently   Allergies  Allergen Reactions   Omeprazole Rash   Prior to Admission medications   Medication Sig Start Date End Date Taking? Authorizing Provider  aspirin 81 MG chewable tablet Chew 1 tablet (81 mg total) by mouth daily. 01/24/22  Yes Wieting, Richard, MD  Cyanocobalamin (B-12) 1000 MCG CAPS Take 1,000 mg by mouth daily.   Yes [provider]  empagliflozin (JARDIANCE) 10 MG TABS tablet Take 1 tablet (10 mg total) by mouth daily before breakfast. 05/09/22  Yes Darylene Price A, FNP  feeding supplement (ENSURE ENLIVE / ENSURE PLUS) LIQD Take 237 mLs by mouth 3 (three) times daily between meals. 01/23/22  Yes  Wieting, Richard, MD  Ferrous Sulfate (IRON) 325 (65 Fe) MG TABS Take 325 mg by mouth daily.   Yes [provider]  furosemide (LASIX) 40 MG tablet Take one tablet daily (can take extra tab in afternoon if gain 3 lbs in a day or 5 lbs in a week) Patient taking differently: Take 40 mg by mouth daily. Take one tablet daily (can take extra tab in afternoon if gain 3 lbs in a day or 5 lbs in a week) 01/30/22  Yes Darylene Price  A, FNP  hydrALAZINE (APRESOLINE) 100 MG tablet Take 1 tablet (100 mg total) by mouth 3 (three) times daily. 04/28/22  Yes Kate Sable, MD  levothyroxine (SYNTHROID) 100 MCG tablet Take 1 tablet (100 mcg total) by mouth daily. 04/25/21  Yes Masoud, Viann Shove, MD  lidocaine-prilocaine (EMLA) cream Apply 1 application topically as needed. Apply small amount to port site at least 1 hour prior to it being accessed, cover with plastic wrap 12/01/20  Yes Earlie Server, MD  metoprolol succinate (TOPROL XL) 25 MG 24 hr tablet Take 1 tablet (25 mg total) by mouth at bedtime. 01/30/22  Yes Darylene Price A, FNP  spironolactone (ALDACTONE) 25 MG tablet Take 1 tablet (25 mg total) by mouth 3x/ week 01/30/22  Yes Darylene Price A, FNP  ondansetron (ZOFRAN) 4 MG tablet Take 1 tablet (4 mg total) by mouth every 8 (eight) hours as needed for nausea or vomiting. Patient not taking: Reported on 05/31/2022 05/30/22   Earlie Server, MD  sacubitril-valsartan (ENTRESTO) 49-51 MG Take 1 tablet by mouth 2 (two) times daily. 05/31/22   Alisa Graff, FNP    Review of Systems  Constitutional:  Positive for fatigue (minimal). Negative for appetite change.  HENT:  Negative for congestion, postnasal drip and sore throat.   Eyes: Negative.   Respiratory:  Negative for cough, chest tightness, shortness of breath and wheezing.   Cardiovascular:  Negative for chest pain, palpitations and leg swelling.  Gastrointestinal:  Negative for abdominal distention and abdominal pain.  Endocrine: Negative.   Genitourinary: Negative.   Musculoskeletal:  Positive for arthralgias (legs/ feet). Negative for neck pain.  Skin: Negative.   Allergic/Immunologic: Negative.   Neurological:  Positive for headaches (in the mornings). Negative for dizziness and light-headedness.  Hematological:  Negative for adenopathy. Does not bruise/bleed easily.  Psychiatric/Behavioral:  Negative for dysphoric mood and sleep disturbance (sleeping on 1-2 pillows). The  patient is not nervous/anxious.    Vitals:   05/31/22 1050  BP: 129/79  Pulse: 61  Resp: 16  SpO2: 100%  Weight: 109 lb 4 oz (49.6 kg)   Wt Readings from Last 3 Encounters:  05/31/22 109 lb 4 oz (49.6 kg)  05/30/22 109 lb 9.6 oz (49.7 kg)  05/09/22 110 lb 4 oz (50 kg)   Lab Results  Component Value Date   CREATININE 1.40 (H) 05/15/2022   CREATININE 1.60 (H) 05/09/2022   CREATININE 1.13 (H) 04/07/2022   Physical Exam Vitals and nursing note reviewed. Exam conducted with a chaperone present (husband).  Constitutional:      Appearance: Normal appearance.  HENT:     Head: Normocephalic and atraumatic.  Cardiovascular:     Rate and Rhythm: Normal rate and regular rhythm.  Pulmonary:     Effort: Pulmonary effort is normal. No respiratory distress.     Breath sounds: No wheezing or rales.  Abdominal:     General: There is no distension.     Palpations: Abdomen is soft.  Musculoskeletal:        General: No tenderness.     Cervical back: Normal range of motion and neck supple.     Right lower leg: No edema.     Left lower leg: No edema.  Skin:    General: Skin is warm and dry.  Neurological:     General: No focal deficit present.     Mental Status: She is alert and oriented to person, place, and time.  Psychiatric:        Mood and Affect: Mood normal.        Behavior: Behavior normal.        Thought Content: Thought content normal.   Assessment & Plan:  1: Chronic heart failure with preserved ejection fraction without structural changes- - NYHA class II - euvolemic today - weighing daily; reminded to call for an overnight weight gain of > 2 pounds or a weekly weight gain of > 5 pounds - weight stable from last visit here 3 weeks ago - adding salt but is trying to use less; reviewed the importance of not adding any salt and to try using Mrs Deliah Boston seasoning instead of salt - drinking 2 cups of coffee and 3 cups water daily; reviewed importance of keeping daily fluid  intake to 60-64 ounces - saw cardiology (Ag[facility]ang) 04/21/22 - jardiance 58m daily - entresto 49/540mBID - spironolactone 2587mx/ week - metoprolol succinate 85m36mily - cardiac [facility] 10/29/20 showed normal LEFT ventricular ejection fraction of 66%. Normal LV wall motion. - BNP 02/03/22 was 2073.3 - PharmD reconciled meds w/ patient and husband  2: HTN with CKD- - BP 129/79 - saw PCP (JohEdwina Barth/24/23 - BMP 05/15/22 reviewed and showed sodium 134, potassium .5.4, creatinine 1.32 & GFR 43 - saw nephrology (SinCandiss Norse8/244; returns 09/19/22  3: Ovarian cancer- - saw oncology (Yu)Tasia Catchings5/24 - last treatment 11/2021  4: Anemia - hemoglobin 05/15/22 was 7.9 - venofer infusion done 05/15/22    Medication list reviewed.   Return in 4 months, sooner if needed.

## 2022-05-30 NOTE — Progress Notes (Signed)
Hematology/Oncology Progress note [phone] [phone]       CHIEF COMPLAINTS/PURPOSE OF VISIT Follow up for chemotherapy for ovarian cancer.  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   Labs are reviewed and discussed with patient. 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 Folate receptor alpha positive   Recommend continue observation, and in the future, when she progresses, consider Mirvetuximab   CHF (congestive heart failure) ([place]) Follow up with cardiology  Macrocytic anemia No significant improvement after holding chemotherapy.  No significant improvement after IV Venofer treatments and B12 injections. There is likely a component of anemia secondary to chronic kidney disease. Currently she is stable. I will hold off erythropoietin replacement therapy due to its angiogenesis effects. In the future, consider erythropoietin therapy if anemia needs to be improved for additional treatments.  B12 is close to low normal.  Status post B12 1000 mcg IM injection x 1, will repeat monthly B12 injection x 2    Orders Placed This Encounter  Procedures   Vitamin B12    Standing Status:   Future    Standing Expiration Date:   05/31/2023   CBC with Differential ([place] Only)    Standing Status:   Future    Standing Expiration Date:   05/31/2023   CMP ([place] only)    Standing Status:   Future    Standing Expiration Date:   05/31/2023   CA 125    Standing Status:   Future    Standing Expiration Date:   05/30/2023   Follow-up  Per LOS  All questions were answered. The patient knows to call the clinic with any problems, questions or  concerns.  Earlie Server, MD, PhD [facility] Hematology Oncology 05/30/2022      HISTORY OF PRESENTING ILLNESS: Katie Hill 72 y.o. female presents for follow up of management of recurrent  ovarian cancer. Oncology History  Malignant neoplasm of ovary ([place])  09/20/2011 Imaging   CT chest abdomen pelvis w contrast 1. No significant interval changed compared with the previous exam.2. Stable appearance of soft tissue attenuating lesion in the posterolateral right hemipelvis. This may represent a treated site of peritoneal disease.3. Unchanged appearance of borderline enlarged left periaortic and aortocaval retroperitoneal lymph nodes.4. Unchanged 6 mm left lower lobe lung nodule. 5. No new signs of metastatic disease within the chest, abdomen or pelvis. 6. Aortic Atherosclerosis (ICD10-I70.0) and Emphysema    11/20/2016 Initial Diagnosis   Malignant neoplasm of ovary   Pathology 11/16/2016 Surgical Pathology  CASE: [id number]  PATIENT: Katie Hill  Surgical Pathology Report   SPECIMEN SUBMITTED:  A. Retroperitoneal adenopathy, left  DIAGNOSIS:  A. LYMPH NODE, LEFT RETROPERITONEAL; CT-GUIDED CORE BIOPSY:  - METASTATIC HIGH-GRADE SEROUS CARCINOMA.  Pathology 03/14/2017   DIAGNOSIS:  A. OMENTUM; OMENTECTOMY:  - NO TUMOR SEEN.  - ONE NEGATIVE LYMPH NODE (0/1).   B.  RIGHT FALLOPIAN TUBE AND OVARY; SALPINGO-OOPHORECTOMY:  - SMALL FOCI OF HIGH GRADE SEROUS CARCINOMA INVOLVING THE OVARY.  - MARKED THERAPY RELATED CHANGE.  - NO TUMOR SEEN IN THE FALLOPIAN TUBE.   C.  UTERUS, CERVIX, LEFT FALLOPIAN TUBE AND OVARY; HYSTERECTOMY AND LEFT  SALPINGO-OOPHORECTOMY:  - NABOTHIAN CYSTS.  -  CYSTIC ATROPHY OF THE ENDOMETRIUM.  - SEROSAL ADHESIONS.  - UNREMARKABLE FALLOPIAN TUBE.  - OVARY SHOWING TREATMENT RELATED CHANGE.   D.  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    12/01/2016 -  Chemotherapy    Carboplatin and taxol x 4 neoadjuvant chemotherapy    03/14/2017 Surgery   Patient had debulking surgery on 03/14/2017. She had a laparoscopy with conversion to laparotomy, total hysterectomy, with bilateral salpingo oophorectomy, right aortic lymph node dissection, omentectomy. Pathology showed small foci of residual disease in ovary.   HRD positive, declined Olarparib maintenance trial    03/28/2017 -  Chemotherapy   Adjuvant carbo and taxol x3 cycles   02/28/2018 Progression   Local Recurrence. CA125 146 CT abdomen pelvis w contrast showed interval enlargement of an aortocaval lymph node which currently measures 1.4 cm in short axis (axial image 75 of series 2). This lymph node is in close proximity to the previously resected retroperitoneal lymphadenopathy and is very concerning for an additional nodal metastasis.    03/15/2018 -  Chemotherapy   03/15/2018-05/17/2018 Carboplatin and Taxol x 4.  CA125 decrease from 49.7 to 6 after 4 cycles of treatment.  05/17/2018-10/29/20   Olarparib 344m BID    11/05/2018 Imaging   MRI abdomenPelvis on 11/05/2018 showed Interval progression of, now measuring 2.7 x 2.3 cm and concerning for progression of metastatic disease.retrocaval lymph node in the abdomen Olaparib was held for short period of time. Her CA125 trended down again.  Dr. BFransisca Connorsrecommending resume olaparib and continue monitor.   10/06/2020 Progression    CT chest abdomen pelvis with contrast showed new lymph nodes in the prevascular space of the upper anterior mediastinum most consistent with metastasis.  Single new right lower lobe pulmonary nodule is concerning for early pulmonary metastasis.  Interval enlargement of the left periaortic retroperitoneal lymph node.  Stable adjacent aorto caval adenopathy.    Genetic Testing   Genetic testing negative for 83 genes on [facility]'s Multi-Cancer panel (ALK, APC, ATM, AXIN2, BAP1, BARD1, BLM, BMPR1A, BRCA1, BRCA2, BRIP1, CASR, CDC73, CDH1, CDK4,  CDKN1B, CDKN1C, CDKN2A, CEBPA, CHEK2, CTNNA1, DICER1, DIS3L2, EGFR, EPCAM, FH, FLCN, GATA2, GPC3, GREM1, HOXB13, HRAS, KIT, MAX, MEN1, MET, MITF, MLH1, MSH2, MSH3, MSH6, MUTYH, NBN, NF1, NF2, NTHL1, PALB2, PDGFRA, PHOX2B, PMS2, POLD1, POLE, POT1, PRKAR1A, PTCH1, PTEN, RAD50, RAD51C, RAD51D, RB1, RECQL4, RET, RUNX1, SDHA, SDHAF2, SDHB, SDHC, SDHD, SMAD4, SMARCA4, SMARCB1, SMARCE1, STK11, SUFU, TERC, TERT, TMEM127, TP53, TSC1, TSC2, VHL, WRN, WT1).   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  Myraid testing negative for somatic BRACA1/2, positive for HRD.    11/03/2020 - 11/18/2021 Chemotherapy   Liposomal Doxorubicin + Bevacizumab q28d   11/03/20 -05/20/21 Doxil + Bevacizumab q28d  06/03/21 Doxil only due to proteinuria.  07/01/21-07/15/21  Resumed on Doxil + Bevacizumab 07/29/21 Doxil only due to proteinuria.  11/18/21 last dose of Doxil. Stopped due to CHF   10/14/2021 Echocardiogram    Left ventricular ejection fraction, by estimation, is 55 to 60%   12/16/2021 Imaging   UKorealower extremities 1. No evidence of deep venous thrombosis in either lower extremity. 2. 1.9 cm left popliteal fossa Baker's cyst.   12/23/2021 Echocardiogram   LVEF 55%   12/26/2021 Imaging   CT chest abdomen pelvis w contrast   Chest Impression:   1. New RIGHT pleural effusion and new mild nodularity along the RIGHT oblique fissure. Recommend close attention on follow-up for potential pleural  metastasis in the RIGHT hemithorax. 2. Stable LEFT lobe pulmonary nodule.3. No mediastinal lymphadenopathy.   Abdomen / Pelvis Impression:   1. Stable soft tissue lesion in the deep RIGHT pelvis. 2. Mass stable small periaortic lymph nodes. 3. No intraperitoneal free fluid the abdomen or pelvis.   01/20/2022 - 01/23/2022 Hospital Admission   Hospitalized due to acute on chronic diastolic CHF, hypertension urgency, She was seen by cardiology and nephrology. BNP was significantly elevated at  4500 She was discharged home with Losartan, Aldactone, Lasix, beta blocker and hydralazine.    02/21/2022 Imaging   CT chest abdomen pelvis w contrast Stable small soft tissue lesion in the right perirectal lesion. Stable mild retroperitoneal and retrocrural lymphadenopathy.Stable 6 mm left lower lobe pulmonary nodule.No new or progressive disease within the chest, abdomen, or pelvis   02/22/2022 Imaging   CT chest abdomen pelvis w contrast Stable small soft tissue lesion in the right perirectal lesion. Stable mild retroperitoneal and retrocrural lymphadenopathy. Stable 6 mm left lower lobe pulmonary nodule. No new or progressive disease within the chest, abdomen, or pelvis.   05/11/2022 Imaging   CT chest abdomen pelvis without contrast showed 1. Stable examination without convincing evidence of new or progressive disease in the chest, abdomen or pelvis on this noncontrast enhanced CT. 2. Stable small soft tissue right perirectal soft tissue lesion.3. Stable 6 mm left lower lobe pulmonary nodule. 4. Stable prominent left supraclavicular and retroperitoneal lymph nodes. 5. Large volume of formed stool in the colon. Correlate for constipation. 6.  Aortic Atherosclerosis   High grade ovarian cancer [facility])    INTERVAL HISTORY Katie Hill is a 72 y.o. female who has above history reviewed by me today presents for follow up visit for recurrent Ovarian cancer Today patient reports feeling well.  Chronic fatigue unchanged.  Appetite is fair.  Weight is stable. Leg swelling has improved.   . Review of Systems  Constitutional:  Positive for fatigue. Negative for appetite change, chills and fever.  HENT:   Negative for hearing loss and voice change.   Eyes:  Negative for eye problems.  Respiratory:  Negative for chest tightness and cough.   Cardiovascular:  Positive for leg swelling. Negative for chest pain.  Gastrointestinal:  Negative for abdominal distention, abdominal pain and blood  in stool.  Endocrine: Negative for hot flashes.  Genitourinary:  Negative for difficulty urinating and frequency.   Musculoskeletal:  Negative for arthralgias.  Skin:  Negative for itching and rash.  Neurological:  Negative for extremity weakness.  Hematological:  Negative for adenopathy.  Psychiatric/Behavioral:  Negative for confusion.      [name] MEDICAL HISTORY: Past Medical History:  Diagnosis Date   (HFpEF) heart failure with preserved ejection fraction ([place])    a. 12/2021 Echo: EF 55%, no rwma, nl RV fxn, mild MR, mild-mod TR. Triv AI.   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   Pelvic mass in female     SURGICAL HISTORY: Past Surgical History:  Procedure Laterality Date   APPENDECTOMY     LAPAROSCOPY N/A 03/14/2017   Procedure: LAPAROSCOPY OPERATIVE;  Surgeon: Mellody Drown, MD;  Location: [facility] ORS;  Service: Gynecology;  Laterality: N/A;   LAPAROTOMY N/A 03/14/2017   Procedure: LAPAROTOMY;  Surgeon:  Mellody Drown, MD;  Location: [facility] ORS;  Service: Gynecology;  Laterality: N/A;   LYMPH NODE DISSECTION N/A 03/14/2017   Procedure: LYMPH NODE DISSECTION;  Surgeon: Mellody Drown, MD;  Location: [facility] OR[facility];  Service: Gynecology;  Laterality: N/A;   OMENTECTOMY N/A 03/14/2017   Procedure: OMENTECTOMY;  Surgeon: Mellody Drown, MD;  Location: [facility] OR[facility];  Service: Gynecology;  Laterality: N/A;   PORTA CATH INSERTION N/A 11/27/2016   Procedure: Glori Luis Cath Insertion;  Surgeon: Algernon Huxley, MD;  Location: [place] [facility];  Service: Cardiovascular;  Laterality: N/A;    SOCIAL HISTORY: Social History   Tobacco Use   Smoking status: Never   Smokeless tobacco: Never  Vaping Use   Vaping Use: Never used  Substance Use Topics   Alcohol use: Not  Currently   Drug use: No     FAMILY HISTORY Family History  Problem Relation Age of Onset   Throat cancer Cousin    Throat cancer Cousin    Leukemia Cousin     ALLERGIES:  is allergic to omeprazole.  MEDICATIONS:  Current Outpatient Medications  Medication Sig Dispense Refill   aspirin 81 MG chewable tablet Chew 1 tablet (81 mg total) by mouth daily. 30 tablet 0   Cyanocobalamin (B-12) 1000 MCG CAPS Take 1,000 mg by mouth daily.     empagliflozin (JARDIANCE) 10 MG TABS tablet Take 1 tablet (10 mg total) by mouth daily before breakfast. 90 tablet 3   feeding supplement (ENSURE ENLIVE / ENSURE PLUS) LIQD Take 237 mLs by mouth 3 (three) times daily between meals. 14220 mL 0   Ferrous Sulfate (IRON) 325 (65 Fe) MG TABS Take 325 mg by mouth daily.     furosemide (LASIX) 40 MG tablet Take one tablet daily (can take extra tab in afternoon if gain 3 lbs in a day or 5 lbs in a week) (Patient taking differently: Take 40 mg by mouth 2 (two) times daily. Take one tablet daily (can take extra tab in afternoon if gain 3 lbs in a day or 5 lbs in a week)) 90 tablet 3   hydrALAZINE (APRESOLINE) 100 MG tablet Take 1 tablet (100 mg total) by mouth 3 (three) times daily. 30 tablet 0   levothyroxine (SYNTHROID) 100 MCG tablet Take 1 tablet (100 mcg total) by mouth daily. 90 tablet 3   lidocaine-prilocaine (EMLA) cream Apply 1 application topically as needed. Apply small amount to port site at least 1 hour prior to it being accessed, cover with plastic wrap 30 g 1   metoprolol succinate (TOPROL XL) 25 MG 24 hr tablet Take 1 tablet (25 mg total) by mouth at bedtime. 90 tablet 3   Multiple Vitamin (MULTIVITAMIN WITH MINERALS) TABS tablet Take 1 tablet by mouth daily.     ondansetron (ZOFRAN) 4 MG tablet Take 1 tablet (4 mg total) by mouth every 8 (eight) hours as needed for nausea or vomiting. 60 tablet 3   sacubitril-valsartan (ENTRESTO) 49-51 MG Take 1 tablet by mouth 2 (two) times daily. 60 tablet 5    spironolactone (ALDACTONE) 25 MG tablet Take 1 tablet (25 mg total) by mouth daily. 90 tablet 3   No current facility-administered medications for this visit.   Facility-Administered Medications Ordered in Other Visits  Medication Dose Route Frequency Provider Last Rate Last Admin   pegfilgrastim (NEULASTA ONPRO KIT) 6 MG/0.6ML injection             PHYSICAL EXAMINATION:  ECOG PERFORMANCE STATUS: 0 - Asymptomatic Vitals:  05/30/22 0948  BP: 134/73  Pulse: 63  Resp: 16  Temp: (!) 96.5 F (35.8 C)  SpO2: 100%     Filed Weights   05/30/22 0948  Weight: 109 lb 9.6 oz (49.7 kg)      Physical Exam Constitutional:      General: She is not in acute distress.    Appearance: She is not diaphoretic.     Comments: Thin built, she walks independantly  HENT:     Head: Normocephalic and atraumatic.     Nose: Nose normal.     Mouth/Throat:     Pharynx: No oropharyngeal exudate.  Eyes:     General: No scleral icterus.    Pupils: Pupils are equal, round, and reactive to light.  Neck:     Vascular: No JVD.  Cardiovascular:     Rate and Rhythm: Normal rate and regular rhythm.     Heart sounds: No murmur heard. Pulmonary:     Effort: Pulmonary effort is normal. No respiratory distress.     Breath sounds: Normal breath sounds. No rales.  Chest:     Chest wall: No tenderness.  Abdominal:     General: Bowel sounds are normal. There is no distension.     Palpations: Abdomen is soft.     Tenderness: There is no abdominal tenderness.  Musculoskeletal:        General: Normal range of motion.     Cervical back: Normal range of motion and neck supple.     Right lower leg: Edema present.     Left lower leg: Edema present.  Lymphadenopathy:     Cervical: No cervical adenopathy.  Skin:    General: Skin is warm and dry.     Findings: No erythema or rash.     Comments: Right anterior medi port +   Neurological:     Mental Status: She is alert and oriented to person, place, and  time.     Cranial Nerves: No cranial nerve deficit.     Motor: No abnormal muscle tone.     Coordination: Coordination normal.  Psychiatric:        Mood and Affect: Affect normal.        Judgment: Judgment normal.        LABORATORY DATA: I have personally reviewed the data as listed:     Latest Ref Rng & Units 05/15/2022   12:46 PM 04/07/2022    9:58 AM 03/10/2022    9:02 AM  CBC  WBC 4.0 - 10.5 K/uL 3.6  2.8  2.9   Hemoglobin 12.0 - 15.0 g/dL 7.9  7.9  8.1   Hematocrit 36.0 - 46.0 % 24.4  24.3  25.4   Platelets 150 - 400 K/uL 127  119  136       Latest Ref Rng & Units 05/15/2022   12:46 PM 05/09/2022   11:44 AM 04/07/2022    9:58 AM  CMP  Glucose 70 - 99 mg/dL 115  87  105   BUN 8 - 23 mg/dL 41  60  26   Creatinine 0.44 - 1.00 mg/dL 1.40  1.60  1.13   Sodium 135 - 145 mmol/L 131  132  135   Potassium 3.5 - 5.1 mmol/L 4.8  4.7  4.1   Chloride 98 - 111 mmol/L 106  103  108   CO2 22 - 32 mmol/L 19  20  22   $ Calcium 8.9 - 10.3 mg/dL 8.9  9.7  9.0  Total Protein 6.5 - 8.1 g/dL 7.1   7.3   Total Bilirubin 0.3 - 1.2 mg/dL 0.2   0.6   Alkaline Phos 38 - 126 U/L 57   64   AST 15 - 41 U/L 20   33   ALT 0 - 44 U/L 14   26      RADIOGRAPHIC STUDIES: I have personally reviewed the radiological images as listed and agreed with the findings in the report. CT CHEST ABDOMEN PELVIS WO CONTRAST  Result Date: 05/11/2022 CLINICAL DATA:  Follow-up recurrent ovarian carcinoma, . * Tracking Code: BO * EXAM: CT CHEST, ABDOMEN AND PELVIS WITHOUT CONTRAST TECHNIQUE: Multidetector CT imaging of the chest, abdomen and pelvis was performed following the standard protocol without IV contrast. RADIATION DOSE REDUCTION: This exam was performed according to the departmental dose-optimization program which includes automated exposure control, adjustment of the mA and/or kV according to patient size and/or use of iterative reconstruction technique. COMPARISON:  Multiple priors including most recent CT  February 21, 2022. FINDINGS: CT CHEST FINDINGS Cardiovascular: Right chest Port-A-Cath with tip at the superior cavoatrial junction. Aortic atherosclerosis. Stable mild cardiomegaly small pericardial effusion similar prior. Mediastinum/Nodes: Stable prominent left supraclavicular lymph node measuring 8 mm in short axis on image 4/2, unchanged no suspicious thyroid nodule. No pathologically enlarged mediastinal, hilar or axillary lymph nodes, noting limited sensitivity for the detection of hilar adenopathy on this noncontrast study. Lungs/Pleura: Biapical pleuroparenchymal scarring. Scattered atelectasis/scarring. Left lower lobe pulmonary nodule measures 6 mm on image 102/3, unchanged. No new suspicious pulmonary nodules or masses. No pleural effusion.  No pneumothorax. Musculoskeletal: No chest wall mass. No aggressive lytic or blastic lesion of bone. CT ABDOMEN PELVIS FINDINGS Hepatobiliary: Unremarkable noncontrast enhanced appearance of the hepatic parenchyma. Gallbladder is unremarkable. No biliary ductal dilation. Pancreas: No pancreatic ductal dilation or evidence of acute inflammation. Spleen: No splenomegaly. Adrenals/Urinary Tract: Bilateral adrenal glands are within normal limits. No hydronephrosis. No nephrolithiasis. Urinary bladder is nondistended limiting evaluation. Stomach/Bowel: Radiopaque enteric contrast material traverses the ascending colon. Stomach is unremarkable for degree of distension. No pathologic dilation of small or large bowel. Large volume of formed stool in the colon. Vascular/Lymphatic: Aortic atherosclerosis. Surgical changes of prior retroperitoneal lymph node dissection. Although difficult to confidently identify given the lack of intravenous contrast material and paucity of intra-abdominal fat the retrocrural, left periaortic and aortocaval space lymph nodes appear overall similar to prior. Previously indexed left periaortic region lymph node measures 11 mm in short axis on  image 83/2, unchanged. No pelvic adenopathy. Reproductive: Prior hysterectomy. Soft tissue density in the posterior pelvis in the right perirectal region measures 2.0 x 1.1 cm on image 111/2 previously 2.2 x 1.5 cm. Other: No significant abdominopelvic free fluid Musculoskeletal: No aggressive lytic or blastic lesion of bone. IMPRESSION: 1. Stable examination without convincing evidence of new or progressive disease in the chest, abdomen or pelvis on this noncontrast enhanced CT. 2. Stable small soft tissue right perirectal soft tissue lesion. 3. Stable 6 mm left lower lobe pulmonary nodule. 4. Stable prominent left supraclavicular and retroperitoneal lymph nodes. 5. Large volume of formed stool in the colon. Correlate for constipation. 6.  Aortic Atherosclerosis (ICD10-I70.0). Electronically Signed   By: Dahlia Bailiff M.D.   On: 05/11/2022 16:19

## 2022-05-30 NOTE — Assessment & Plan Note (Addendum)
05/11/2022 CT chest abdomen pelvis w contrast showed stable disease.   Labs are reviewed and discussed with patient. 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 Folate receptor alpha positive   Recommend continue observation, and in the future, when she progresses, consider Mirvetuximab

## 2022-05-30 NOTE — Assessment & Plan Note (Signed)
Follow up with cardiology

## 2022-05-31 ENCOUNTER — Encounter: Payer: Self-pay | Admitting: Family

## 2022-05-31 ENCOUNTER — Ambulatory Visit: Payer: Medicare Other | Attending: Family | Admitting: Family

## 2022-05-31 ENCOUNTER — Encounter: Payer: Self-pay | Admitting: Pharmacist

## 2022-05-31 VITALS — BP 129/79 | HR 61 | Resp 16 | Wt 109.2 lb

## 2022-05-31 DIAGNOSIS — Z79899 Other long term (current) drug therapy: Secondary | ICD-10-CM | POA: Insufficient documentation

## 2022-05-31 DIAGNOSIS — I10 Essential (primary) hypertension: Secondary | ICD-10-CM | POA: Diagnosis not present

## 2022-05-31 DIAGNOSIS — I5032 Chronic diastolic (congestive) heart failure: Secondary | ICD-10-CM

## 2022-05-31 DIAGNOSIS — I130 Hypertensive heart and chronic kidney disease with heart failure and stage 1 through stage 4 chronic kidney disease, or unspecified chronic kidney disease: Secondary | ICD-10-CM | POA: Diagnosis not present

## 2022-05-31 DIAGNOSIS — Z7982 Long term (current) use of aspirin: Secondary | ICD-10-CM | POA: Diagnosis not present

## 2022-05-31 DIAGNOSIS — N189 Chronic kidney disease, unspecified: Secondary | ICD-10-CM | POA: Insufficient documentation

## 2022-05-31 DIAGNOSIS — D649 Anemia, unspecified: Secondary | ICD-10-CM | POA: Diagnosis not present

## 2022-05-31 DIAGNOSIS — Z7984 Long term (current) use of oral hypoglycemic drugs: Secondary | ICD-10-CM | POA: Diagnosis not present

## 2022-05-31 DIAGNOSIS — R5383 Other fatigue: Secondary | ICD-10-CM | POA: Insufficient documentation

## 2022-05-31 DIAGNOSIS — C569 Malignant neoplasm of unspecified ovary: Secondary | ICD-10-CM | POA: Diagnosis not present

## 2022-05-31 DIAGNOSIS — E876 Hypokalemia: Secondary | ICD-10-CM | POA: Diagnosis not present

## 2022-05-31 DIAGNOSIS — D6481 Anemia due to antineoplastic chemotherapy: Secondary | ICD-10-CM | POA: Diagnosis not present

## 2022-05-31 DIAGNOSIS — R519 Headache, unspecified: Secondary | ICD-10-CM | POA: Insufficient documentation

## 2022-05-31 MED ORDER — SACUBITRIL-VALSARTAN 49-51 MG PO TABS: 1.0000 | ORAL_TABLET | Freq: Two times a day (BID) | ORAL | 3 refills | Status: AC

## 2022-05-31 NOTE — Patient Instructions (Signed)
Continue weighing daily and call for an overnight weight gain of 3 pounds or more or a weekly weight gain of more than 5 pounds. ? ?

## 2022-06-01 NOTE — Progress Notes (Signed)
Patient ID: Katie Hill, female   DOB: 09-18-1950, 72 y.o.   MRN: [id number]  [place] - PHARMACIST COUNSELING NOTE  *HFpEF*  Guideline-Directed Medical Therapy/Evidence Based Medicine  ACE/ARB/ARNI: Sacubitril-valsartan 49-51 mg twice daily Beta Blocker: Metoprolol succinate 25 mg daily Aldosterone Antagonist: Spironolactone 25 mg 3x/week Diuretic: Furosemide 40 mg daily and 77m PRN SGLT2i: Empagliflozin 10 mg daily  Adherence Assessment  Do you ever forget to take your medication? []$ Yes [x]$ No  Do you ever skip doses due to side effects? []$ Yes [x]$ No  Do you have trouble affording your medicines? []$ Yes [x]$ No  Are you ever unable to pick up your medication due to transportation difficulties? []$ Yes [x]$ No  Do you ever stop taking your medications because you don't believe they are helping? []$ Yes [x]$ No  Do you check your weight daily? [x]$ Yes []$ No   Adherence strategy: pill box  Barriers to obtaining medications: none  Vital signs: HR 61, BP 129/79, weight (pounds) 109 lbs  ECHO: Date 12/23/2021,  EF of 55% along with mild MR and mild/moderate       Latest Ref Rng & Units 05/15/2022   12:46 PM 05/09/2022   11:44 AM 04/07/2022    9:58 AM  BMP  Glucose 70 - 99 mg/dL 115  87  105   BUN 8 - 23 mg/dL 41  60  26   Creatinine 0.44 - 1.00 mg/dL 1.40  1.60  1.13   Sodium 135 - 145 mmol/L 131  132  135   Potassium 3.5 - 5.1 mmol/L 4.8  4.7  4.1   Chloride 98 - 111 mmol/L 106  103  108   CO2 22 - 32 mmol/L 19  20  22   $ Calcium 8.9 - 10.3 mg/dL 8.9  9.7  9.0     Past Medical History:  Diagnosis Date   (HFpEF) heart failure with preserved ejection fraction ([place]    a. 12/2021 Echo: EF 55%, no rwma, nl RV fxn, mild MR, mild-mod TR. Triv AI.   Anemia    CHF (congestive heart failure) (HCC)    Chronic kidney disease    Dysrhythmia    Essential hypertension    Genetic testing 03/28/2017   Multi-Cancer panel (83 genes) @ [facility] -  No pathogenic mutations detected   High grade ovarian cancer ([place] 11/20/2016   Pelvic mass in female     ASSESSMENT 72year old female who presents to the HF clinic for follow up. PMH includes ovarian cancer, HTN, CKD, anemia and chronic heart failure. Last admission to acute care was oct/13/2023 for acute on chronic HF.   Medication reconciliation completed during OV. Noted nephrologist deceased her spironolactone from 243mpo daily to spironolactone 2598mo 3/week. Patient denied issues with affordability and denies ADR to current therapy. Last BMEt completed 05/25/3022.   PLAN No changes in current therapy Repeat BMET in 1-2 weeks Follow up per NP recommendations   Time spent: 15 minutes  Saidi Santacroce Rodriguez-Guzman PharmD, BCPS 06/01/2022 8:34 AM    Current Outpatient Medications:    aspirin 81 MG chewable tablet, Chew 1 tablet (81 mg total) by mouth daily., Disp: 30 tablet, Rfl: 0   Cyanocobalamin (B-12) 1000 MCG CAPS, Take 1,000 mg by mouth daily., Disp: , Rfl:    empagliflozin (JARDIANCE) 10 MG TABS tablet, Take 1 tablet (10 mg total) by mouth daily before breakfast., Disp: 90 tablet, Rfl: 3   feeding supplement (ENSURE ENLIVE / ENSURE PLUS) LIQD, Take 237 mLs by  mouth 3 (three) times daily between meals., Disp: 14220 mL, Rfl: 0   Ferrous Sulfate (IRON) 325 (65 Fe) MG TABS, Take 325 mg by mouth daily., Disp: , Rfl:    furosemide (LASIX) 40 MG tablet, Take one tablet daily (can take extra tab in afternoon if gain 3 lbs in a day or 5 lbs in a week) (Patient taking differently: Take 40 mg by mouth daily. Take one tablet daily (can take extra tab in afternoon if gain 3 lbs in a day or 5 lbs in a week)), Disp: 90 tablet, Rfl: 3   hydrALAZINE (APRESOLINE) 100 MG tablet, Take 1 tablet (100 mg total) by mouth 3 (three) times daily., Disp: 30 tablet, Rfl: 0   levothyroxine (SYNTHROID) 100 MCG tablet, Take 1 tablet (100 mcg total) by mouth daily., Disp: 90 tablet, Rfl: 3    lidocaine-prilocaine (EMLA) cream, Apply 1 application topically as needed. Apply small amount to port site at least 1 hour prior to it being accessed, cover with plastic wrap, Disp: 30 g, Rfl: 1   metoprolol succinate (TOPROL XL) 25 MG 24 hr tablet, Take 1 tablet (25 mg total) by mouth at bedtime., Disp: 90 tablet, Rfl: 3   ondansetron (ZOFRAN) 4 MG tablet, Take 1 tablet (4 mg total) by mouth every 8 (eight) hours as needed for nausea or vomiting. (Patient not taking: Reported on 05/31/2022), Disp: 60 tablet, Rfl: 3   sacubitril-valsartan (ENTRESTO) 49-51 MG, Take 1 tablet by mouth 2 (two) times daily., Disp: 180 tablet, Rfl: 3   spironolactone (ALDACTONE) 25 MG tablet, Take 1 tablet (25 mg total) by mouth daily., Disp: 90 tablet, Rfl: 3 No current facility-administered medications for this visit.  Facility-Administered Medications Ordered in Other Visits:    pegfilgrastim (NEULASTA ONPRO KIT) 6 MG/0.6ML injection, , , ,    MEDICATION ADHERENCES TIPS AND STRATEGIES Taking medication as prescribed improves patient outcomes in heart failure (reduces hospitalizations, improves symptoms, increases survival) Side effects of medications can be managed by decreasing doses, switching agents, stopping drugs, or adding additional therapy. Please let someone in the [facility] know if you have having bothersome side effects so we can modify your regimen. Do not alter your medication regimen without talking to [place].  Medication reminders can help patients remember to take drugs on time. If you are missing or forgetting doses you can try linking behaviors, using pill boxes, or an electronic reminder like an alarm on your phone or an app. Some people can also get automated phone calls as medication reminders.

## 2022-06-09 ENCOUNTER — Ambulatory Visit: Payer: Medicare Other | Attending: Cardiology | Admitting: Cardiology

## 2022-06-09 ENCOUNTER — Encounter: Payer: Self-pay | Admitting: Cardiology

## 2022-06-09 VITALS — BP 140/74 | HR 64 | Ht 67.0 in | Wt 109.0 lb

## 2022-06-09 DIAGNOSIS — I10 Essential (primary) hypertension: Secondary | ICD-10-CM | POA: Diagnosis not present

## 2022-06-09 DIAGNOSIS — I5032 Chronic diastolic (congestive) heart failure: Secondary | ICD-10-CM | POA: Insufficient documentation

## 2022-06-09 NOTE — Patient Instructions (Signed)
Medication Instructions:  Your physician recommends that you continue on your current medications as directed. Please refer to the Current Medication list given to you today.  *If you need a refill on your cardiac medications before your next appointment, please call your pharmacy*   Lab Work: -None ordered If you have labs (blood work) drawn today and your tests are completely normal, you will receive your results only by: [place] (if you have MyChart) OR A paper copy in the mail If you have any lab test that is abnormal or we need to change your treatment, we will call you to review the results.   Testing/Procedures: -None ordered   Follow-Up: At [facility], you and your health needs are our priority.  As part of our continuing mission to provide you with exceptional heart care, we have created designated Provider Care Teams.  These Care Teams include your primary Cardiologist (physician) and Advanced Practice Providers (APPs -  Physician Assistants and Nurse Practitioners) who all work together to provide you with the care you need, when you need it.  We recommend signing up for the patient portal called "MyChart".  Sign up information is provided on this After Visit Summary.  MyChart is used to connect with patients for Virtual Visits (Telemedicine).  Patients are able to view lab/test results, encounter notes, upcoming appointments, etc.  Non-urgent messages can be sent to your provider as well.   To learn more about what you can do with MyChart, go to [email or web address].    Your next appointment:   6 month(s)  Provider:   You may see Kate Sable, MD or one of the following Advanced Practice Providers on your designated Care Team:   Murray Hodgkins, NP Christell Faith, PA-C Cadence Kathlen Mody, PA-C Gerrie Nordmann, NP  Other Instructions -None

## 2022-06-09 NOTE — Progress Notes (Signed)
Cardiology Office Note:    Date:  06/09/2022   ID:  Katie Hill, DOB 03/29/1951, MRN [id number]  PCP:  Baxter Hire, MD   [facility] Providers Cardiologist:  Kate Sable, MD     Referring MD: Baxter Hire, MD   Chief Complaint  Patient presents with   Follow-up    6-8 week f/u, no new cardiac concerns    History of Present Illness:    Katie Hill is a 72 y.o. female with a hx of hypertension, HFpEF, ovarian cancer, who presents for follow-up.    Blood pressures are well-controlled at home, systolics usually [id number].  Denies chest pain or shortness of breath.  Denies edema, Lasix has been helping with edema.  Feels well, has no concerns at this time.   Prior notes/studies Echocardiogram 12/2021 EF 55% History of bradycardia with beta-blocker.  Past Medical History:  Diagnosis Date   (HFpEF) heart failure with preserved ejection fraction ([place])    a. 12/2021 Echo: EF 55%, no rwma, nl RV fxn, mild MR, mild-mod TR. Triv AI.   Anemia    CHF (congestive heart failure) (HCC)    Chronic kidney disease    Dysrhythmia    Essential hypertension    Genetic testing 03/28/2017   Multi-Cancer panel (83 genes) @ [facility] - No pathogenic mutations detected   High grade ovarian cancer ([place]) 11/20/2016   Pelvic mass in female     Past Surgical History:  Procedure Laterality Date   APPENDECTOMY     LAPAROSCOPY N/A 03/14/2017   Procedure: LAPAROSCOPY OPERATIVE;  Surgeon: Mellody Drown, MD;  Location: [facility] ORS;  Service: Gynecology;  Laterality: N/A;   LAPAROTOMY N/A 03/14/2017   Procedure: LAPAROTOMY;  Surgeon: Mellody Drown, MD;  Location: [facility] ORS;  Service: Gynecology;  Laterality: N/A;   LYMPH NODE DISSECTION N/A 03/14/2017   Procedure: LYMPH NODE DISSECTION;  Surgeon: Mellody Drown, MD;  Location: [facility] ORS;  Service: Gynecology;  Laterality: N/A;   OMENTECTOMY N/A 03/14/2017   Procedure: OMENTECTOMY;  Surgeon: Mellody Drown, MD;  Location:  [facility] ORS;  Service: Gynecology;  Laterality: N/A;   PORTA CATH INSERTION N/A 11/27/2016   Procedure: Glori Luis Cath Insertion;  Surgeon: Algernon Huxley, MD;  Location: [place] [facility];  Service: Cardiovascular;  Laterality: N/A;    Current Medications: Current Meds  Medication Sig   aspirin 81 MG chewable tablet Chew 1 tablet (81 mg total) by mouth daily.   Cyanocobalamin (B-12) 1000 MCG CAPS Take 1,000 mg by mouth daily.   empagliflozin (JARDIANCE) 10 MG TABS tablet Take 1 tablet (10 mg total) by mouth daily before breakfast.   feeding supplement (ENSURE ENLIVE / ENSURE PLUS) LIQD Take 237 mLs by mouth 3 (three) times daily between meals.   Ferrous Sulfate (IRON) 325 (65 Fe) MG TABS Take 325 mg by mouth daily.   furosemide (LASIX) 40 MG tablet Take one tablet daily (can take extra tab in afternoon if gain 3 lbs in a day or 5 lbs in a week) (Patient taking differently: Take 40 mg by mouth daily. Take one tablet daily (can take extra tab in afternoon if gain 3 lbs in a day or 5 lbs in a week))   hydrALAZINE (APRESOLINE) 100 MG tablet Take 1 tablet (100 mg total) by mouth 3 (three) times daily.   levothyroxine (SYNTHROID) 100 MCG tablet Take 1 tablet (100 mcg total) by mouth daily.   lidocaine-prilocaine (EMLA) cream Apply 1 application topically as needed. Apply small amount  to port site at least 1 hour prior to it being accessed, cover with plastic wrap   metoprolol succinate (TOPROL XL) 25 MG 24 hr tablet Take 1 tablet (25 mg total) by mouth at bedtime.   sacubitril-valsartan (ENTRESTO) 49-51 MG Take 1 tablet by mouth 2 (two) times daily.   spironolactone (ALDACTONE) 25 MG tablet Take 1 tablet (25 mg total) by mouth daily.     Allergies:   Omeprazole   Social History   Socioeconomic History   Marital status: Married    Spouse name: Not on file   Number of children: Not on file   Years of education: Not on file   Highest education level: Not on file  Occupational History   Not on file   Tobacco Use   Smoking status: Never   Smokeless tobacco: Never  Vaping Use   Vaping Use: Never used  Substance and Sexual Activity   Alcohol use: Not Currently   Drug use: No   Sexual activity: Yes    Birth control/protection: Post-menopausal  Other Topics Concern   Not on file  Social History Narrative   Lives locally with husband.  Was fairly active.   Social Determinants of Health   Financial Resource Strain: Low Risk  (04/21/2021)   Overall Financial Resource Strain (CARDIA)    Difficulty of Paying Living Expenses: Not hard at all  Food Insecurity: No Food Insecurity (01/22/2022)   Hunger Vital Sign    Worried About Running Out of Food in the Last Year: Never true    Ran Out of Food in the Last Year: Never true  Transportation Needs: No Transportation Needs (01/22/2022)   PRAPARE - Hydrologist (Medical): No    Lack of Transportation (Non-Medical): No  Physical Activity: Insufficiently Active (04/21/2021)   Exercise Vital Sign    Days of Exercise per Week: 6 days    Minutes of Exercise per Session: 20 min  Stress: No Stress Concern Present (04/21/2021)   [place]    Feeling of Stress : Not at all  Social Connections: Moderately Isolated (04/21/2021)   Social Connection and Isolation Panel [NHANES]    Frequency of Communication with Friends and Family: Three times a week    Frequency of Social Gatherings with Friends and Family: Twice a week    Attends Religious Services: Never    Marine scientist or Organizations: No    Attends Music therapist: Never    Marital Status: Married     Family History: The patient's family history includes Leukemia in her cousin; Throat cancer in her cousin and cousin.  ROS:   Please see the history of present illness.     All other systems reviewed and are negative.  EKGs/Labs/Other Studies Reviewed:    The following  studies were reviewed today:   EKG:  EKG not ordered today.    Recent Labs: 12/30/2021: TSH 22.325 01/22/2022: Magnesium 1.8 02/03/2022: B Natriuretic Peptide 2,073.3 05/15/2022: ALT 14; BUN 41; Creatinine, Ser 1.40; Hemoglobin 7.9; Platelets 127; Potassium 4.8; Sodium 131  Recent Lipid Panel No results found for: "CHOL", "TRIG", "HDL", "CHOLHDL", "VLDL", "LDLCALC", "LDLDIRECT"   Risk Assessment/Calculations:         Physical Exam:    VS:  BP (!) 140/74 (BP Location: Left Arm)   Pulse 64   Ht '5\' 7"'$  (1.702 m)   Wt 109 lb (49.4 kg)   SpO2 99%  BMI 17.07 kg/m     Wt Readings from Last 3 Encounters:  06/09/22 109 lb (49.4 kg)  05/31/22 109 lb 4 oz (49.6 kg)  05/30/22 109 lb 9.6 oz (49.7 kg)     GEN:  Well nourished, well developed in no acute distress HEENT: Normal NECK: No JVD; No carotid bruits CARDIAC: RRR, no murmurs, rubs, gallops RESPIRATORY:  Clear to auscultation without rales, wheezing or rhonchi  ABDOMEN: Soft, non-tender, non-distended MUSCULOSKELETAL: no edema; No deformity  SKIN: Warm and dry NEUROLOGIC:  Alert and oriented x 3 PSYCHIATRIC:  Normal affect   ASSESSMENT:    1. Primary hypertension   2. Chronic diastolic heart failure (HCC)    PLAN:    In order of problems listed above:  Hypertension, BP elevated today, usually controlled, continue hydralazine 100 mg 3 times daily.  Continue Entresto 49-51 mg twice daily, Aldactone 25 mg daily. HFpEF, appears euvolemic.  Continue Lasix, Aldactone.  EF 55%.  describes NYHA class II symptoms.  Follow-up in 6 months.     Medication Adjustments/Labs and Tests Ordered: Current medicines are reviewed at length with the patient today.  Concerns regarding medicines are outlined above.  No orders of the defined types were placed in this encounter.  No orders of the defined types were placed in this encounter.   Patient Instructions  Medication Instructions:  Your physician recommends that you continue  on your current medications as directed. Please refer to the Current Medication list given to you today.  *If you need a refill on your cardiac medications before your next appointment, please call your pharmacy*   Lab Work: -None ordered If you have labs (blood work) drawn today and your tests are completely normal, you will receive your results only by: [place] (if you have MyChart) OR A paper copy in the mail If you have any lab test that is abnormal or we need to change your treatment, we will call you to review the results.   Testing/Procedures: -None ordered   Follow-Up: At [facility], you and your health needs are our priority.  As part of our continuing mission to provide you with exceptional heart care, we have created designated Provider Care Teams.  These Care Teams include your primary Cardiologist (physician) and Advanced Practice Providers (APPs -  Physician Assistants and Nurse Practitioners) who all work together to provide you with the care you need, when you need it.  We recommend signing up for the patient portal called "MyChart".  Sign up information is provided on this After Visit Summary.  MyChart is used to connect with patients for Virtual Visits (Telemedicine).  Patients are able to view lab/test results, encounter notes, upcoming appointments, etc.  Non-urgent messages can be sent to your provider as well.   To learn more about what you can do with MyChart, go to [email or web address].    Your next appointment:   6 month(s)  Provider:   You may see Kate Sable, MD or one of the following Advanced Practice Providers on your designated Care Team:   Murray Hodgkins, NP Christell Faith, PA-C Cadence Kathlen Mody, PA-C Gerrie Nordmann, NP  Other Instructions -None    Signed, Kate Sable, MD  06/09/2022 11:55 AM    [place]

## 2022-06-13 ENCOUNTER — Inpatient Hospital Stay: Payer: Medicare Other | Attending: Oncology

## 2022-06-13 DIAGNOSIS — E538 Deficiency of other specified B group vitamins: Secondary | ICD-10-CM | POA: Diagnosis not present

## 2022-06-13 DIAGNOSIS — E871 Hypo-osmolality and hyponatremia: Secondary | ICD-10-CM

## 2022-06-13 MED ORDER — CYANOCOBALAMIN 1000 MCG/ML IJ SOLN
1000.0000 ug | INTRAMUSCULAR | Status: DC
  Administered 2022-06-13: 1000 ug via INTRAMUSCULAR
  Filled 2022-06-13: qty 1

## 2022-06-16 ENCOUNTER — Ambulatory Visit: Payer: Medicare Other | Admitting: Cardiology

## 2022-07-10 ENCOUNTER — Inpatient Hospital Stay: Payer: Medicare Other

## 2022-07-11 ENCOUNTER — Inpatient Hospital Stay: Payer: Medicare Other | Attending: Oncology

## 2022-07-11 DIAGNOSIS — C561 Malignant neoplasm of right ovary: Secondary | ICD-10-CM | POA: Insufficient documentation

## 2022-07-11 DIAGNOSIS — I130 Hypertensive heart and chronic kidney disease with heart failure and stage 1 through stage 4 chronic kidney disease, or unspecified chronic kidney disease: Secondary | ICD-10-CM | POA: Insufficient documentation

## 2022-07-11 DIAGNOSIS — E538 Deficiency of other specified B group vitamins: Secondary | ICD-10-CM | POA: Insufficient documentation

## 2022-07-11 DIAGNOSIS — Z452 Encounter for adjustment and management of vascular access device: Secondary | ICD-10-CM | POA: Diagnosis not present

## 2022-07-11 DIAGNOSIS — N189 Chronic kidney disease, unspecified: Secondary | ICD-10-CM | POA: Diagnosis not present

## 2022-07-11 DIAGNOSIS — Z95828 Presence of other vascular implants and grafts: Secondary | ICD-10-CM

## 2022-07-11 DIAGNOSIS — D539 Nutritional anemia, unspecified: Secondary | ICD-10-CM | POA: Diagnosis not present

## 2022-07-11 DIAGNOSIS — C569 Malignant neoplasm of unspecified ovary: Secondary | ICD-10-CM

## 2022-07-11 LAB — CBC WITH DIFFERENTIAL (CANCER CENTER ONLY)
Abs Immature Granulocytes: 0.01 10*3/uL (ref 0.00–0.07)
Basophils Absolute: 0 10*3/uL (ref 0.0–0.1)
Basophils Relative: 1 %
Eosinophils Absolute: 0.2 10*3/uL (ref 0.0–0.5)
Eosinophils Relative: 5 %
HCT: 23.9 % — ABNORMAL LOW (ref 36.0–46.0)
Hemoglobin: 7.7 g/dL — ABNORMAL LOW (ref 12.0–15.0)
Immature Granulocytes: 0 %
Lymphocytes Relative: 24 %
Lymphs Abs: 0.8 10*3/uL (ref 0.7–4.0)
MCH: 32.6 pg (ref 26.0–34.0)
MCHC: 32.2 g/dL (ref 30.0–36.0)
MCV: 101.3 fL — ABNORMAL HIGH (ref 80.0–100.0)
Monocytes Absolute: 0.4 10*3/uL (ref 0.1–1.0)
Monocytes Relative: 12 %
Neutro Abs: 1.9 10*3/uL (ref 1.7–7.7)
Neutrophils Relative %: 58 %
Platelet Count: 130 10*3/uL — ABNORMAL LOW (ref 150–400)
RBC: 2.36 MIL/uL — ABNORMAL LOW (ref 3.87–5.11)
RDW: 13.3 % (ref 11.5–15.5)
WBC Count: 3.3 10*3/uL — ABNORMAL LOW (ref 4.0–10.5)
nRBC: 0 % (ref 0.0–0.2)

## 2022-07-11 LAB — CMP (CANCER CENTER ONLY)
ALT: 9 U/L (ref 0–44)
AST: 17 U/L (ref 15–41)
Albumin: 3.9 g/dL (ref 3.5–5.0)
Alkaline Phosphatase: 64 U/L (ref 38–126)
Anion gap: 5 (ref 5–15)
BUN: 28 mg/dL — ABNORMAL HIGH (ref 8–23)
CO2: 23 mmol/L (ref 22–32)
Calcium: 9.4 mg/dL (ref 8.9–10.3)
Chloride: 104 mmol/L (ref 98–111)
Creatinine: 1.26 mg/dL — ABNORMAL HIGH (ref 0.44–1.00)
GFR, Estimated: 46 mL/min — ABNORMAL LOW (ref >60)
Glucose, Bld: 76 mg/dL (ref 70–99)
Potassium: 4.2 mmol/L (ref 3.5–5.1)
Sodium: 132 mmol/L — ABNORMAL LOW (ref 135–145)
Total Bilirubin: 0.3 mg/dL (ref 0.3–1.2)
Total Protein: 7.1 g/dL (ref 6.5–8.1)

## 2022-07-11 LAB — VITAMIN B12: Vitamin B-12: 1337 pg/mL — ABNORMAL HIGH (ref 180–914)

## 2022-07-11 MED ORDER — SODIUM CHLORIDE 0.9% FLUSH
10.0000 mL | Freq: Once | INTRAVENOUS | Status: AC
  Administered 2022-07-11: 10 mL via INTRAVENOUS
  Filled 2022-07-11: qty 10

## 2022-07-11 MED ORDER — HEPARIN SOD (PORK) LOCK FLUSH 100 UNIT/ML IV SOLN
500.0000 [IU] | Freq: Once | INTRAVENOUS | Status: AC
  Administered 2022-07-11: 500 [IU] via INTRAVENOUS
  Filled 2022-07-11: qty 5

## 2022-07-12 LAB — CA 125: Cancer Antigen (CA) 125: 22.7 U/mL (ref 0.0–38.1)

## 2022-07-13 ENCOUNTER — Inpatient Hospital Stay (HOSPITAL_BASED_OUTPATIENT_CLINIC_OR_DEPARTMENT_OTHER): Payer: Medicare Other | Admitting: Oncology

## 2022-07-13 ENCOUNTER — Inpatient Hospital Stay: Payer: Medicare Other

## 2022-07-13 ENCOUNTER — Encounter: Payer: Self-pay | Admitting: Oncology

## 2022-07-13 ENCOUNTER — Telehealth: Payer: Self-pay

## 2022-07-13 VITALS — BP 143/66 | HR 61 | Temp 96.9°F | Resp 18 | Wt 108.4 lb

## 2022-07-13 DIAGNOSIS — I5031 Acute diastolic (congestive) heart failure: Secondary | ICD-10-CM | POA: Diagnosis not present

## 2022-07-13 DIAGNOSIS — R1900 Intra-abdominal and pelvic swelling, mass and lump, unspecified site: Secondary | ICD-10-CM

## 2022-07-13 DIAGNOSIS — Z95828 Presence of other vascular implants and grafts: Secondary | ICD-10-CM

## 2022-07-13 DIAGNOSIS — C561 Malignant neoplasm of right ovary: Secondary | ICD-10-CM | POA: Diagnosis not present

## 2022-07-13 DIAGNOSIS — D539 Nutritional anemia, unspecified: Secondary | ICD-10-CM

## 2022-07-13 DIAGNOSIS — Z452 Encounter for adjustment and management of vascular access device: Secondary | ICD-10-CM | POA: Diagnosis not present

## 2022-07-13 DIAGNOSIS — C569 Malignant neoplasm of unspecified ovary: Secondary | ICD-10-CM | POA: Diagnosis not present

## 2022-07-13 DIAGNOSIS — E538 Deficiency of other specified B group vitamins: Secondary | ICD-10-CM | POA: Diagnosis not present

## 2022-07-13 DIAGNOSIS — I10 Essential (primary) hypertension: Secondary | ICD-10-CM

## 2022-07-13 DIAGNOSIS — N189 Chronic kidney disease, unspecified: Secondary | ICD-10-CM | POA: Diagnosis not present

## 2022-07-13 DIAGNOSIS — I130 Hypertensive heart and chronic kidney disease with heart failure and stage 1 through stage 4 chronic kidney disease, or unspecified chronic kidney disease: Secondary | ICD-10-CM | POA: Diagnosis not present

## 2022-07-13 NOTE — Assessment & Plan Note (Signed)
No significant improvement after holding chemotherapy.  No significant improvement after IV Venofer treatments and B12 injections. There is likely a component of anemia secondary to chronic kidney disease. I will hold off erythropoietin replacement therapy due to its angiogenesis effects. B12 level is adequate.  I recommend bone marrow biopsy for evaluation, rule out bone marrow disorders, ie therapy induced MDS etc.

## 2022-07-13 NOTE — Progress Notes (Signed)
Hematology/Oncology Progress note [phone] [phone]       CHIEF COMPLAINTS/PURPOSE OF VISIT Follow up for chemotherapy for ovarian cancer.  ASSESSMENT & PLAN:   Cancer Staging  Malignant neoplasm of ovary Staging form: Ovary, Fallopian Tube, and Primary Peritoneal Carcinoma, AJCC 8th Edition - Clinical stage from 11/22/2016: Stage IIIC (cT3c, cN1b, cM0) - Signed by Earlie Server, MD on 11/22/2016   Malignant neoplasm of ovary ([place]) 05/11/2022 CT chest abdomen pelvis w contrast showed stable disease.   Labs are reviewed and discussed with patient. 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 Folate receptor alpha positive   Recommend continue observation, and in the future, when she progresses, consider Mirvetuximab  Repeat CT in Mid May 2024  Hypertension Better controlled now Follow up with cardiology and nephrology  CHF (congestive heart failure) ([place]) Follow up with cardiology  Port-A-Cath in place Continue port flush Q6-8 weeks  Macrocytic anemia No significant improvement after holding chemotherapy.  No significant improvement after IV Venofer treatments and B12 injections. There is likely a component of anemia secondary to chronic kidney disease. I will hold off erythropoietin replacement therapy due to its angiogenesis effects. B12 level is adequate.  I recommend bone marrow biopsy for evaluation, rule out bone marrow disorders, ie therapy induced MDS etc.     Orders Placed This Encounter  Procedures   CT CHEST ABDOMEN PELVIS WO CONTRAST    Standing Status:   Future    Standing Expiration Date:   07/13/2023    Order Specific Question:   Preferred imaging location?    Answer:   [facility]    Order Specific Question:   Is Oral Contrast requested for this exam?    Answer:   Yes, Per Radiology protocol    Order Specific Question:   Does the patient have a contrast media/X-ray dye allergy?    Answer:   No   CT BONE MARROW BIOPSY & ASPIRATION    Standing  Status:   Future    Standing Expiration Date:   01/12/2023    Order Specific Question:   Reason for Exam (SYMPTOM  OR DIAGNOSIS REQUIRED)    Answer:   Anemia    Order Specific Question:   Preferred location?    Answer:    [facility]    Order Specific Question:   Radiology Contrast Protocol - do NOT remove file path    Answer:   [email or web address]   CBC with Differential (Cancer Center Only)    Standing Status:   Future    Standing Expiration Date:   07/13/2023   Iron and TIBC    Standing Status:   Future    Standing Expiration Date:   07/13/2023   Ferritin    Standing Status:   Future    Standing Expiration Date:   07/13/2023   Retic Panel    Standing Status:   Future    Standing Expiration Date:   07/13/2023   Follow-up after bone marrow biopsy.    All questions were answered. The patient knows to call the clinic with any problems, questions or concerns.  Earlie Server, MD, PhD [facility] Hematology Oncology 07/13/2022      HISTORY OF PRESENTING ILLNESS: Katie Hill 72 y.o. female presents for follow up of management of recurrent  ovarian cancer. Oncology History  Malignant neoplasm of ovary  09/20/2011 Imaging   CT chest abdomen pelvis w contrast 1. No significant interval changed compared with the previous exam.2. Stable appearance of soft tissue attenuating lesion in  the posterolateral right hemipelvis. This may represent a treated site of peritoneal disease.3. Unchanged appearance of borderline enlarged left periaortic and aortocaval retroperitoneal lymph nodes.4. Unchanged 6 mm left lower lobe lung nodule. 5. No new signs of metastatic disease within the chest, abdomen or pelvis. 6. Aortic Atherosclerosis (ICD10-I70.0) and Emphysema    11/20/2016 Initial Diagnosis   Malignant neoplasm of ovary   Pathology 11/16/2016 Surgical Pathology  CASE: [id number]  PATIENT: Katie Hill  Surgical Pathology Report   SPECIMEN  SUBMITTED:  A. Retroperitoneal adenopathy, left  DIAGNOSIS:  A. LYMPH NODE, LEFT RETROPERITONEAL; CT-GUIDED CORE BIOPSY:  - METASTATIC HIGH-GRADE SEROUS CARCINOMA.  Pathology 03/14/2017   DIAGNOSIS:  A. OMENTUM; OMENTECTOMY:  - NO TUMOR SEEN.  - ONE NEGATIVE LYMPH NODE (0/1).   B.  RIGHT FALLOPIAN TUBE AND OVARY; SALPINGO-OOPHORECTOMY:  - SMALL FOCI OF HIGH GRADE SEROUS CARCINOMA INVOLVING THE OVARY.  - MARKED THERAPY RELATED CHANGE.  - NO TUMOR SEEN IN THE FALLOPIAN TUBE.   C.  UTERUS, CERVIX, LEFT FALLOPIAN TUBE AND OVARY; HYSTERECTOMY AND LEFT  SALPINGO-OOPHORECTOMY:  - NABOTHIAN CYSTS.  - CYSTIC ATROPHY OF THE ENDOMETRIUM.  - SEROSAL ADHESIONS.  - UNREMARKABLE FALLOPIAN TUBE.  - OVARY SHOWING TREATMENT RELATED CHANGE.   D.  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    12/01/2016 -  Chemotherapy   Carboplatin and taxol x 4 neoadjuvant chemotherapy    03/14/2017 Surgery   Patient had debulking surgery on 03/14/2017. She had a laparoscopy with conversion to laparotomy, total hysterectomy, with bilateral salpingo oophorectomy, right aortic lymph node dissection, omentectomy. Pathology showed small foci of residual disease in ovary.   HRD positive, declined Olarparib maintenance trial    03/28/2017 -  Chemotherapy   Adjuvant carbo and taxol x3 cycles   02/28/2018 Progression   Local Recurrence. CA125 146 CT abdomen pelvis w contrast showed interval enlargement of an aortocaval lymph node which currently measures 1.4 cm in short axis (axial image 75 of series 2). This lymph node is in close proximity to the previously resected retroperitoneal lymphadenopathy and is very concerning for an additional nodal metastasis.    03/15/2018 -  Chemotherapy   03/15/2018-05/17/2018 Carboplatin and Taxol x 4.  CA125 decrease from 49.7 to 6 after 4 cycles of treatment.  05/17/2018-10/29/20   Olarparib 300mg  BID     11/05/2018 Imaging   MRI abdomenPelvis on 11/05/2018 showed Interval progression of, now measuring 2.7 x 2.3 cm and concerning for progression of metastatic disease.retrocaval lymph node in the abdomen Olaparib was held for short period of time. Her CA125 trended down again.  Dr. Fransisca Connors recommending resume olaparib and continue monitor.   10/06/2020 Progression    CT chest abdomen pelvis with contrast showed new lymph nodes in the prevascular space of the upper anterior mediastinum most consistent with metastasis.  Single new right lower lobe pulmonary nodule is concerning for early pulmonary metastasis.  Interval enlargement of the left periaortic retroperitoneal lymph node.  Stable adjacent aorto caval adenopathy.    Genetic Testing   Genetic testing negative for 83 genes on [facility]'s Multi-Cancer panel (ALK, APC, ATM, AXIN2, BAP1, BARD1, BLM, BMPR1A, BRCA1, BRCA2, BRIP1, CASR, CDC73, CDH1, CDK4, CDKN1B, CDKN1C, CDKN2A, CEBPA, CHEK2, CTNNA1, DICER1, DIS3L2, EGFR, EPCAM, FH, FLCN, GATA2, GPC3, GREM1, HOXB13, HRAS, KIT, MAX, MEN1, MET, MITF, MLH1, MSH2, MSH3, MSH6, MUTYH, NBN, NF1, NF2, NTHL1, PALB2, PDGFRA, PHOX2B, PMS2, POLD1, POLE, POT1, PRKAR1A, PTCH1, PTEN, RAD50, RAD51C, RAD51D, RB1, RECQL4, RET, RUNX1,  SDHA, SDHAF2, SDHB, SDHC, SDHD, SMAD4, SMARCA4, SMARCB1, SMARCE1, STK11, SUFU, TERC, TERT, TMEM127, TP53, TSC1, TSC2, VHL, WRN, WT1).   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  Myraid testing negative for somatic BRACA1/2, positive for HRD.    11/03/2020 - 11/18/2021 Chemotherapy   Liposomal Doxorubicin + Bevacizumab q28d   11/03/20 -05/20/21 Doxil + Bevacizumab q28d  06/03/21 Doxil only due to proteinuria.  07/01/21-07/15/21  Resumed on Doxil + Bevacizumab 07/29/21 Doxil only due to proteinuria.  11/18/21 last dose of Doxil. Stopped due to CHF   10/14/2021 Echocardiogram    Left ventricular ejection fraction, by estimation, is 55 to 60%   12/16/2021 Imaging    [place] lower extremities 1. No evidence of deep venous thrombosis in either lower extremity. 2. 1.9 cm left popliteal fossa Baker's cyst.   12/23/2021 Echocardiogram   LVEF 55%   12/26/2021 Imaging   CT chest abdomen pelvis w contrast   Chest Impression:   1. New RIGHT pleural effusion and new mild nodularity along the RIGHT oblique fissure. Recommend close attention on follow-up for potential pleural metastasis in the RIGHT hemithorax. 2. Stable LEFT lobe pulmonary nodule.3. No mediastinal lymphadenopathy.   Abdomen / Pelvis Impression:   1. Stable soft tissue lesion in the deep RIGHT pelvis. 2. Mass stable small periaortic lymph nodes. 3. No intraperitoneal free fluid the abdomen or pelvis.   01/20/2022 - 01/23/2022 Hospital Admission   Hospitalized due to acute on chronic diastolic CHF, hypertension urgency, She was seen by cardiology and nephrology. BNP was significantly elevated at 4500 She was discharged home with Losartan, Aldactone, Lasix, beta blocker and hydralazine.    02/21/2022 Imaging   CT chest abdomen pelvis w contrast Stable small soft tissue lesion in the right perirectal lesion. Stable mild retroperitoneal and retrocrural lymphadenopathy.Stable 6 mm left lower lobe pulmonary nodule.No new or progressive disease within the chest, abdomen, or pelvis   02/22/2022 Imaging   CT chest abdomen pelvis w contrast Stable small soft tissue lesion in the right perirectal lesion. Stable mild retroperitoneal and retrocrural lymphadenopathy. Stable 6 mm left lower lobe pulmonary nodule. No new or progressive disease within the chest, abdomen, or pelvis.   05/11/2022 Imaging   CT chest abdomen pelvis without contrast showed 1. Stable examination without convincing evidence of new or progressive disease in the chest, abdomen or pelvis on this noncontrast enhanced CT. 2. Stable small soft tissue right perirectal soft tissue lesion.3. Stable 6 mm left lower lobe pulmonary nodule. 4.  Stable prominent left supraclavicular and retroperitoneal lymph nodes. 5. Large volume of formed stool in the colon. Correlate for constipation. 6.  Aortic Atherosclerosis   High grade ovarian cancer    INTERVAL HISTORY Katie Hill is a 72 y.o. female who has above history reviewed by me today presents for follow up visit for recurrent Ovarian cancer Patient reports no new problems since last visit. Expressed relief and surprise at the improvement of cancer antigen levels, stating "my marker is 22" and recalling that "last time, 28." She denies feeling very tired or fatigued.  . Review of Systems  Constitutional:  Positive for fatigue. Negative for appetite change, chills and fever.  HENT:   Negative for hearing loss and voice change.   Eyes:  Negative for eye problems.  Respiratory:  Negative for chest tightness and cough.   Cardiovascular:  Negative for chest pain and leg swelling.  Gastrointestinal:  Negative for abdominal distention, abdominal pain and blood in stool.  Endocrine: Negative for  hot flashes.  Genitourinary:  Negative for difficulty urinating and frequency.   Musculoskeletal:  Negative for arthralgias.  Skin:  Negative for itching and rash.  Neurological:  Negative for extremity weakness.  Hematological:  Negative for adenopathy.  Psychiatric/Behavioral:  Negative for confusion.      [name] MEDICAL HISTORY: Past Medical History:  Diagnosis Date   (HFpEF) heart failure with preserved ejection fraction    a. 12/2021 Echo: EF 55%, no rwma, nl RV fxn, mild MR, mild-mod TR. Triv AI.   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   Pelvic mass in female     SURGICAL HISTORY: Past Surgical History:  Procedure Laterality Date   APPENDECTOMY      LAPAROSCOPY N/A 03/14/2017   Procedure: LAPAROSCOPY OPERATIVE;  Surgeon: Mellody Drown, MD;  Location: [facility];  Service: Gynecology;  Laterality: N/A;   LAPAROTOMY N/A 03/14/2017   Procedure: LAPAROTOMY;  Surgeon: Mellody Drown, MD;  Location: [facility];  Service: Gynecology;  Laterality: N/A;   LYMPH NODE DISSECTION N/A 03/14/2017   Procedure: LYMPH NODE DISSECTION;  Surgeon: Mellody Drown, MD;  Location: [facility];  Service: Gynecology;  Laterality: N/A;   OMENTECTOMY N/A 03/14/2017   Procedure: OMENTECTOMY;  Surgeon: Mellody Drown, MD;  Location: [facility];  Service: Gynecology;  Laterality: N/A;   PORTA CATH INSERTION N/A 11/27/2016   Procedure: Glori Luis Cath Insertion;  Surgeon: Algernon Huxley, MD;  Location: [place] [facility];  Service: Cardiovascular;  Laterality: N/A;    SOCIAL HISTORY: Social History   Tobacco Use   Smoking status: Never   Smokeless tobacco: Never  Vaping Use   Vaping Use: Never used  Substance Use Topics   Alcohol use: Not Currently   Drug use: No     FAMILY HISTORY Family History  Problem Relation Age of Onset   Throat cancer Cousin    Throat cancer Cousin    Leukemia Cousin     ALLERGIES:  is allergic to omeprazole.  MEDICATIONS:  Current Outpatient Medications  Medication Sig Dispense Refill   aspirin 81 MG chewable tablet Chew 1 tablet (81 mg total) by mouth daily. 30 tablet 0   Cyanocobalamin (B-12) 1000 MCG CAPS Take 1,000 mg by mouth daily.     empagliflozin (JARDIANCE) 10 MG TABS tablet Take 1 tablet (10 mg total) by mouth daily before breakfast. 90 tablet 3   feeding supplement (ENSURE ENLIVE / ENSURE PLUS) LIQD Take 237 mLs by mouth 3 (three) times daily between meals. 14220 mL 0   Ferrous Sulfate (IRON) 325 (65 Fe) MG TABS Take 325 mg by mouth daily.     furosemide (LASIX) 40 MG tablet Take one tablet daily (can take extra tab in afternoon if gain 3 lbs in a day or 5 lbs in a week) (Patient taking differently: Take 40 mg by mouth  daily. Take one tablet daily (can take extra tab in afternoon if gain 3 lbs in a day or 5 lbs in a week)) 90 tablet 3   hydrALAZINE (APRESOLINE) 100 MG tablet Take 1 tablet (100 mg total) by mouth 3 (three) times daily. 30 tablet 0   levothyroxine (SYNTHROID) 100 MCG tablet Take 1 tablet (100 mcg total) by mouth daily. 90 tablet 3   lidocaine-prilocaine (EMLA)  cream Apply 1 application topically as needed. Apply small amount to port site at least 1 hour prior to it being accessed, cover with plastic wrap 30 g 1   metoprolol succinate (TOPROL XL) 25 MG 24 hr tablet Take 1 tablet (25 mg total) by mouth at bedtime. 90 tablet 3   ondansetron (ZOFRAN) 4 MG tablet Take 1 tablet (4 mg total) by mouth every 8 (eight) hours as needed for nausea or vomiting. 60 tablet 3   sacubitril-valsartan (ENTRESTO) 49-51 MG Take 1 tablet by mouth 2 (two) times daily. 180 tablet 3   spironolactone (ALDACTONE) 25 MG tablet Take 1 tablet (25 mg total) by mouth daily. 90 tablet 3   No current facility-administered medications for this visit.   Facility-Administered Medications Ordered in Other Visits  Medication Dose Route Frequency Provider Last Rate Last Admin   pegfilgrastim (NEULASTA ONPRO KIT) 6 MG/0.6ML injection             PHYSICAL EXAMINATION:  ECOG PERFORMANCE STATUS: 0 - Asymptomatic Vitals:   07/13/22 0949  BP: (!) 143/66  Pulse: 61  Resp: 18  Temp: (!) 96.9 F (36.1 C)     Filed Weights   07/13/22 0949  Weight: 108 lb 6.4 oz (49.2 kg)      Physical Exam Constitutional:      General: She is not in acute distress.    Appearance: She is not diaphoretic.     Comments: Thin built, she walks independantly  HENT:     Head: Normocephalic and atraumatic.     Nose: Nose normal.     Mouth/Throat:     Pharynx: No oropharyngeal exudate.  Eyes:     General: No scleral icterus.    Pupils: Pupils are equal, round, and reactive to light.  Neck:     Vascular: No JVD.  Cardiovascular:     Rate  and Rhythm: Normal rate and regular rhythm.     Heart sounds: No murmur heard. Pulmonary:     Effort: Pulmonary effort is normal. No respiratory distress.     Breath sounds: Normal breath sounds. No rales.  Chest:     Chest wall: No tenderness.  Abdominal:     General: Bowel sounds are normal. There is no distension.     Palpations: Abdomen is soft.     Tenderness: There is no abdominal tenderness.  Musculoskeletal:        General: Normal range of motion.     Cervical back: Normal range of motion and neck supple.     Right lower leg: Edema present.     Left lower leg: Edema present.  Lymphadenopathy:     Cervical: No cervical adenopathy.  Skin:    General: Skin is warm and dry.     Findings: No erythema or rash.     Comments: Right anterior medi port +   Neurological:     Mental Status: She is alert and oriented to person, place, and time.     Cranial Nerves: No cranial nerve deficit.     Motor: No abnormal muscle tone.     Coordination: Coordination normal.  Psychiatric:        Mood and Affect: Affect normal.        Judgment: Judgment normal.        LABORATORY DATA: I have personally reviewed the data as listed:     Latest Ref Rng & Units 07/11/2022    9:22 AM 05/15/2022   12:46 PM 04/07/2022  9:58 AM  CBC  WBC 4.0 - 10.5 K/uL 3.3  3.6  2.8   Hemoglobin 12.0 - 15.0 g/dL 7.7  7.9  7.9   Hematocrit 36.0 - 46.0 % 23.9  24.4  24.3   Platelets 150 - 400 K/uL 130  127  119       Latest Ref Rng & Units 07/11/2022    9:22 AM 05/15/2022   12:46 PM 05/09/2022   11:44 AM  CMP  Glucose 70 - 99 mg/dL 76  115  87   BUN 8 - 23 mg/dL 28  41  60   Creatinine 0.44 - 1.00 mg/dL 1.26  1.40  1.60   Sodium 135 - 145 mmol/L 132  131  132   Potassium 3.5 - 5.1 mmol/L 4.2  4.8  4.7   Chloride 98 - 111 mmol/L 104  106  103   CO2 22 - 32 mmol/L 23  19  20    Calcium 8.9 - 10.3 mg/dL 9.4  8.9  9.7   Total Protein 6.5 - 8.1 g/dL 7.1  7.1    Total Bilirubin 0.3 - 1.2 mg/dL 0.3  0.2     Alkaline Phos 38 - 126 U/L 64  57    AST 15 - 41 U/L 17  20    ALT 0 - 44 U/L 9  14       RADIOGRAPHIC STUDIES: I have personally reviewed the radiological images as listed and agreed with the findings in the report. CT CHEST ABDOMEN PELVIS WO CONTRAST  Result Date: 05/11/2022 CLINICAL DATA:  Follow-up recurrent ovarian carcinoma, . * Tracking Code: BO * EXAM: CT CHEST, ABDOMEN AND PELVIS WITHOUT CONTRAST TECHNIQUE: Multidetector CT imaging of the chest, abdomen and pelvis was performed following the standard protocol without IV contrast. RADIATION DOSE REDUCTION: This exam was performed according to the departmental dose-optimization program which includes automated exposure control, adjustment of the mA and/or kV according to patient size and/or use of iterative reconstruction technique. COMPARISON:  Multiple priors including most recent CT February 21, 2022. FINDINGS: CT CHEST FINDINGS Cardiovascular: Right chest Port-A-Cath with tip at the superior cavoatrial junction. Aortic atherosclerosis. Stable mild cardiomegaly small pericardial effusion similar prior. Mediastinum/Nodes: Stable prominent left supraclavicular lymph node measuring 8 mm in short axis on image 4/2, unchanged no suspicious thyroid nodule. No pathologically enlarged mediastinal, hilar or axillary lymph nodes, noting limited sensitivity for the detection of hilar adenopathy on this noncontrast study. Lungs/Pleura: Biapical pleuroparenchymal scarring. Scattered atelectasis/scarring. Left lower lobe pulmonary nodule measures 6 mm on image 102/3, unchanged. No new suspicious pulmonary nodules or masses. No pleural effusion.  No pneumothorax. Musculoskeletal: No chest wall mass. No aggressive lytic or blastic lesion of bone. CT ABDOMEN PELVIS FINDINGS Hepatobiliary: Unremarkable noncontrast enhanced appearance of the hepatic parenchyma. Gallbladder is unremarkable. No biliary ductal dilation. Pancreas: No pancreatic ductal dilation or  evidence of acute inflammation. Spleen: No splenomegaly. Adrenals/Urinary Tract: Bilateral adrenal glands are within normal limits. No hydronephrosis. No nephrolithiasis. Urinary bladder is nondistended limiting evaluation. Stomach/Bowel: Radiopaque enteric contrast material traverses the ascending colon. Stomach is unremarkable for degree of distension. No pathologic dilation of small or large bowel. Large volume of formed stool in the colon. Vascular/Lymphatic: Aortic atherosclerosis. Surgical changes of prior retroperitoneal lymph node dissection. Although difficult to confidently identify given the lack of intravenous contrast material and paucity of intra-abdominal fat the retrocrural, left periaortic and aortocaval space lymph nodes appear overall similar to prior. Previously indexed left periaortic region lymph node measures  11 mm in short axis on image 83/2, unchanged. No pelvic adenopathy. Reproductive: Prior hysterectomy. Soft tissue density in the posterior pelvis in the right perirectal region measures 2.0 x 1.1 cm on image 111/2 previously 2.2 x 1.5 cm. Other: No significant abdominopelvic free fluid Musculoskeletal: No aggressive lytic or blastic lesion of bone. IMPRESSION: 1. Stable examination without convincing evidence of new or progressive disease in the chest, abdomen or pelvis on this noncontrast enhanced CT. 2. Stable small soft tissue right perirectal soft tissue lesion. 3. Stable 6 mm left lower lobe pulmonary nodule. 4. Stable prominent left supraclavicular and retroperitoneal lymph nodes. 5. Large volume of formed stool in the colon. Correlate for constipation. 6.  Aortic Atherosclerosis (ICD10-I70.0). Electronically Signed   By: Dahlia Bailiff M.D.   On: 05/11/2022 16:19

## 2022-07-13 NOTE — Assessment & Plan Note (Signed)
Follow up with cardiology

## 2022-07-13 NOTE — Assessment & Plan Note (Signed)
Better controlled now Follow up with cardiology and nephrology

## 2022-07-13 NOTE — Assessment & Plan Note (Addendum)
05/11/2022 CT chest abdomen pelvis w contrast showed stable disease.   Labs are reviewed and discussed with patient. 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 Folate receptor alpha positive   Recommend continue observation, and in the future, when she progresses, consider Mirvetuximab  Repeat CT in [facility] May 2024

## 2022-07-13 NOTE — Progress Notes (Signed)
Patient here for follow up. No new concerns voiced.  °

## 2022-07-13 NOTE — Telephone Encounter (Signed)
Per [place] : 07/13/22  Bone marrow biopsy - anemia.   2-3 weeks after BM biopsy - lab and MD same day, cbc iron tibc ferritin, retic panel.

## 2022-07-13 NOTE — Assessment & Plan Note (Addendum)
Continue port flush Q6-8 weeks.  

## 2022-07-14 ENCOUNTER — Inpatient Hospital Stay: Payer: Medicare Other

## 2022-07-14 ENCOUNTER — Encounter: Payer: Self-pay | Admitting: Oncology

## 2022-07-14 NOTE — Telephone Encounter (Signed)
Pt scheduled for Bmbx on 4/12. Pt aware of appt.   Please schedule lab/MD approx 2-3 weeks after biopsy and inform pt of appt.

## 2022-07-20 ENCOUNTER — Other Ambulatory Visit: Payer: Self-pay | Admitting: Radiology

## 2022-07-20 DIAGNOSIS — Z01812 Encounter for preprocedural laboratory examination: Secondary | ICD-10-CM

## 2022-07-20 NOTE — Progress Notes (Signed)
Patient for IR Bone Marrow Biopsy on Fri 07/21/2022, I called and spoke with the patient on the phone and gave pre-procedure instructions. Pt was made aware to be here at 8:30a, NPO after MN prior to procedure as well as driver post procedure/recovery/discharge. Pt stated understanding.  Called 07/20/2022

## 2022-07-21 ENCOUNTER — Other Ambulatory Visit: Payer: Self-pay

## 2022-07-21 ENCOUNTER — Encounter: Payer: Self-pay | Admitting: Radiology

## 2022-07-21 ENCOUNTER — Ambulatory Visit
Admission: RE | Admit: 2022-07-21 | Discharge: 2022-07-21 | Disposition: A | Discharge status: Home / Self Care | Payer: Medicare Other | Source: Ambulatory Visit | Attending: Oncology | Admitting: Oncology

## 2022-07-21 DIAGNOSIS — D539 Nutritional anemia, unspecified: Secondary | ICD-10-CM

## 2022-07-21 DIAGNOSIS — N189 Chronic kidney disease, unspecified: Secondary | ICD-10-CM | POA: Insufficient documentation

## 2022-07-21 DIAGNOSIS — Z9221 Personal history of antineoplastic chemotherapy: Secondary | ICD-10-CM | POA: Diagnosis not present

## 2022-07-21 DIAGNOSIS — Z1379 Encounter for other screening for genetic and chromosomal anomalies: Secondary | ICD-10-CM | POA: Insufficient documentation

## 2022-07-21 DIAGNOSIS — I130 Hypertensive heart and chronic kidney disease with heart failure and stage 1 through stage 4 chronic kidney disease, or unspecified chronic kidney disease: Secondary | ICD-10-CM | POA: Diagnosis not present

## 2022-07-21 DIAGNOSIS — Z8543 Personal history of malignant neoplasm of ovary: Secondary | ICD-10-CM | POA: Insufficient documentation

## 2022-07-21 DIAGNOSIS — Z01812 Encounter for preprocedural laboratory examination: Secondary | ICD-10-CM

## 2022-07-21 DIAGNOSIS — D72810 Lymphocytopenia: Secondary | ICD-10-CM | POA: Diagnosis not present

## 2022-07-21 DIAGNOSIS — I5032 Chronic diastolic (congestive) heart failure: Secondary | ICD-10-CM | POA: Insufficient documentation

## 2022-07-21 DIAGNOSIS — D649 Anemia, unspecified: Secondary | ICD-10-CM | POA: Insufficient documentation

## 2022-07-21 HISTORY — PX: IR BONE MARROW BIOPSY & ASPIRATION: IMG5727

## 2022-07-21 LAB — CBC WITH DIFFERENTIAL/PLATELET
Abs Immature Granulocytes: 0.01 10*3/uL (ref 0.00–0.07)
Basophils Absolute: 0 10*3/uL (ref 0.0–0.1)
Basophils Relative: 1 %
Eosinophils Absolute: 0.2 10*3/uL (ref 0.0–0.5)
Eosinophils Relative: 5 %
HCT: 27.5 % — ABNORMAL LOW (ref 36.0–46.0)
Hemoglobin: 9 g/dL — ABNORMAL LOW (ref 12.0–15.0)
Immature Granulocytes: 0 %
Lymphocytes Relative: 28 %
Lymphs Abs: 0.9 10*3/uL (ref 0.7–4.0)
MCH: 33.1 pg (ref 26.0–34.0)
MCHC: 32.7 g/dL (ref 30.0–36.0)
MCV: 101.1 fL — ABNORMAL HIGH (ref 80.0–100.0)
Monocytes Absolute: 0.4 10*3/uL (ref 0.1–1.0)
Monocytes Relative: 11 %
Neutro Abs: 1.8 10*3/uL (ref 1.7–7.7)
Neutrophils Relative %: 55 %
Platelets: 153 10*3/uL (ref 150–400)
RBC: 2.72 MIL/uL — ABNORMAL LOW (ref 3.87–5.11)
RDW: 13.3 % (ref 11.5–15.5)
WBC: 3.3 10*3/uL — ABNORMAL LOW (ref 4.0–10.5)
nRBC: 0 % (ref 0.0–0.2)

## 2022-07-21 MED ORDER — LIDOCAINE HCL (PF) 1 % IJ SOLN
5.0000 mL | Freq: Once | INTRAMUSCULAR | Status: AC
  Administered 2022-07-21: 5 mL

## 2022-07-21 MED ORDER — MIDAZOLAM HCL 5 MG/5ML IJ SOLN
INTRAMUSCULAR | Status: AC | PRN
  Administered 2022-07-21: 1 mg via INTRAVENOUS

## 2022-07-21 MED ORDER — HEPARIN SOD (PORK) LOCK FLUSH 100 UNIT/ML IV SOLN
INTRAVENOUS | Status: AC
  Filled 2022-07-21: qty 5

## 2022-07-21 MED ORDER — SODIUM CHLORIDE 0.9 % IV SOLN: INTRAVENOUS | Status: DC

## 2022-07-21 MED ORDER — FENTANYL CITRATE (PF) 100 MCG/2ML IJ SOLN
INTRAMUSCULAR | Status: AC
  Filled 2022-07-21: qty 2

## 2022-07-21 MED ORDER — MIDAZOLAM HCL 2 MG/2ML IJ SOLN
INTRAMUSCULAR | Status: AC
  Filled 2022-07-21: qty 2

## 2022-07-21 MED ORDER — FENTANYL CITRATE (PF) 100 MCG/2ML IJ SOLN
INTRAMUSCULAR | Status: AC | PRN
  Administered 2022-07-21: 50 ug via INTRAVENOUS

## 2022-07-21 NOTE — Procedures (Signed)
Interventional Radiology Procedure Note  Procedure: IR FLUORO BM ASP AND CORE    Complications: None  Estimated Blood Loss:  MIN  Findings: 11 G CORE AND ASP    M. Ruel Favors, MD

## 2022-07-21 NOTE — Progress Notes (Signed)
Patient clinically stable post BMB per Dr Miles Costain, tolerated well. Vitals stable pre and post procedure. Denies complaints post procedure. Received Versed 1 mg along with Fentanyl 50 mcg IV for procedure. Report given to [place] RN post procedure/329

## 2022-07-21 NOTE — H&P (Signed)
Chief Complaint: Patient was seen in consultation today for macrocytic anemia  Referring Physician(s): Yu,Zhou  Supervising Physician: Ruel Favors  Patient Status: [facility] - Out-pt  History of Present Illness: Katie Hill is a 72 y.o. female with PMH significant for CHF, CKD, hypertension, and ovarian cancer being seen today in relation to macrocytic anemia. The patient has been experiencing macrocytic anemia without improvement following the cessation of chemotherapy, and with IV Venofer and B12 injections. Image-guided bone marrow biopsy has been requested by Dr Cathie Hoops from Hematology/Oncology to rule out bone marrow disorders.  Past Medical History:  Diagnosis Date   (HFpEF) heart failure with preserved ejection fraction    a. 12/2021 Echo: EF 55%, no rwma, nl RV fxn, mild MR, mild-mod TR. Triv AI.   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   Pelvic mass in female     Past Surgical History:  Procedure Laterality Date   APPENDECTOMY     LAPAROSCOPY N/A 03/14/2017   Procedure: LAPAROSCOPY OPERATIVE;  Surgeon: Leida Lauth, MD;  Location: [facility] ORS;  Service: Gynecology;  Laterality: N/A;   LAPAROTOMY N/A 03/14/2017   Procedure: LAPAROTOMY;  Surgeon: Leida Lauth, MD;  Location: [facility] ORS;  Service: Gynecology;  Laterality: N/A;   LYMPH NODE DISSECTION N/A 03/14/2017   Procedure: LYMPH NODE DISSECTION;  Surgeon: Leida Lauth, MD;  Location: [facility] ORS;  Service: Gynecology;  Laterality: N/A;   OMENTECTOMY N/A 03/14/2017   Procedure: OMENTECTOMY;  Surgeon: Leida Lauth, MD;  Location: [facility] ORS;  Service: Gynecology;  Laterality: N/A;   PORTA CATH INSERTION N/A 11/27/2016   Procedure: Shelda Pal Cath Insertion;  Surgeon: Annice Needy, MD;  Location: [facility]  INVASIVE CV LAB;  Service: Cardiovascular;  Laterality: N/A;    Allergies: Omeprazole  Medications: Prior to Admission medications   Medication Sig Start Date End Date Taking? Authorizing Provider  aspirin 81 MG chewable tablet Chew 1 tablet (81 mg total) by mouth daily. 01/24/22   Wieting, Richard, MD  Cyanocobalamin (B-12) 1000 MCG CAPS Take 1,000 mg by mouth daily.    [provider]  empagliflozin (JARDIANCE) 10 MG TABS tablet Take 1 tablet (10 mg total) by mouth daily before breakfast. 05/09/22   Delma Freeze, FNP  feeding supplement (ENSURE ENLIVE / ENSURE PLUS) LIQD Take 237 mLs by mouth 3 (three) times daily between meals. 01/23/22   Alford Highland, MD  Ferrous Sulfate (IRON) 325 (65 Fe) MG TABS Take 325 mg by mouth daily.    [provider]  furosemide (LASIX) 40 MG tablet Take one tablet daily (can take extra tab in afternoon if gain 3 lbs in a day or 5 lbs in a week) Patient taking differently: Take 40 mg by mouth daily. Take one tablet daily (can take extra tab in afternoon if gain 3 lbs in a day or 5 lbs in a week) 01/30/22   Delma Freeze, FNP  hydrALAZINE (APRESOLINE) 100 MG tablet Take 1 tablet (100 mg total) by mouth 3 (three) times daily. 04/28/22   Debbe Odea, MD  levothyroxine (SYNTHROID) 100 MCG tablet Take 1 tablet (100 mcg total) by mouth daily. 04/25/21   Corky Downs, MD  lidocaine-prilocaine (EMLA) cream Apply 1 application topically as needed. Apply small amount to port site  at least 1 hour prior to it being accessed, cover with plastic wrap 12/01/20   Rickard Patience, MD  metoprolol succinate (TOPROL XL) 25 MG 24 hr tablet Take 1 tablet (25 mg total) by mouth at bedtime. 01/30/22   Delma Freeze, FNP  ondansetron (ZOFRAN) 4 MG tablet Take 1 tablet (4 mg total) by mouth every 8 (eight) hours as needed for nausea or vomiting. 05/30/22   Rickard Patience, MD  sacubitril-valsartan (ENTRESTO) 49-51 MG Take 1 tablet by mouth 2 (two) times daily. 05/31/22    Delma Freeze, FNP  spironolactone (ALDACTONE) 25 MG tablet Take 1 tablet (25 mg total) by mouth daily. 01/30/22   Delma Freeze, FNP     Family History  Problem Relation Age of Onset   Throat cancer Cousin    Throat cancer Cousin    Leukemia Cousin     Social History   Socioeconomic History   Marital status: Married    Spouse name: Not on file   Number of children: Not on file   Years of education: Not on file   Highest education level: Not on file  Occupational History   Not on file  Tobacco Use   Smoking status: Never   Smokeless tobacco: Never  Vaping Use   Vaping Use: Never used  Substance and Sexual Activity   Alcohol use: Not Currently   Drug use: No   Sexual activity: Yes    Birth control/protection: Post-menopausal  Other Topics Concern   Not on file  Social History Narrative   Lives locally with husband.  Was fairly active.   Social Determinants of Health   Financial Resource Strain: Low Risk  (04/21/2021)   Overall Financial Resource Strain (CARDIA)    Difficulty of Paying Living Expenses: Not hard at all  Food Insecurity: No Food Insecurity (01/22/2022)   Hunger Vital Sign    Worried About Running Out of Food in the Last Year: Never true    Ran Out of Food in the Last Year: Never true  Transportation Needs: No Transportation Needs (01/22/2022)   PRAPARE - Administrator, Civil Service (Medical): No    Lack of Transportation (Non-Medical): No  Physical Activity: Insufficiently Active (04/21/2021)   Exercise Vital Sign    Days of Exercise per Week: 6 days    Minutes of Exercise per Session: 20 min  Stress: No Stress Concern Present (04/21/2021)   [employer] of Occupational Health - Occupational Stress Questionnaire    Feeling of Stress : Not at all  Social Connections: Moderately Isolated (04/21/2021)   Social Connection and Isolation Panel [NHANES]    Frequency of Communication with Friends and Family: Three times a week     Frequency of Social Gatherings with Friends and Family: Twice a week    Attends Religious Services: Never    Database administrator or Organizations: No    Attends Engineer, structural: Never    Marital Status: Married    Code Status: Full Code  Review of Systems: A 12 point ROS discussed and pertinent positives are indicated in the HPI above.  All other systems are negative.  Review of Systems  Constitutional:  Negative for chills and fever.  Respiratory:  Negative for chest tightness and shortness of breath.   Cardiovascular:  Negative for chest pain and leg swelling.  Gastrointestinal:  Negative for abdominal pain, diarrhea, nausea and vomiting.  Neurological:  Negative for dizziness and headaches.  Psychiatric/Behavioral:  Negative for  confusion.     Vital Signs: BP (!) 148/72   Pulse 92   Temp (!) 97.4 F (36.3 C) (Oral)   Resp 19   Ht  (1.702 m)   Wt 108 lb (49 kg)   SpO2 98%   BMI 16.92 kg/m    Physical Exam Vitals reviewed.  Constitutional:      General: She is not in acute distress. Cardiovascular:     Rate and Rhythm: Normal rate and regular rhythm.     Pulses: Normal pulses.     Heart sounds: Normal heart sounds.  Pulmonary:     Effort: Pulmonary effort is normal.     Breath sounds: Normal breath sounds.  Abdominal:     General: Bowel sounds are normal.     Palpations: Abdomen is soft.     Tenderness: There is no abdominal tenderness.  Musculoskeletal:     Right lower leg: No edema.     Left lower leg: No edema.  Skin:    General: Skin is warm and dry.  Neurological:     Mental Status: She is alert and oriented to person, place, and time.  Psychiatric:        Mood and Affect: Mood normal.        Behavior: Behavior normal.        Thought Content: Thought content normal.        Judgment: Judgment normal.     Imaging: No results found.  Labs:  CBC: Recent Labs    03/10/22 0902 04/07/22 0958 05/15/22 1246 07/11/22 0922   WBC 2.9* 2.8* 3.6* 3.3*  HGB 8.1* 7.9* 7.9* 7.7*  HCT 25.4* 24.3* 24.4* 23.9*  PLT 136* 119* 127* 130*    COAGS: No results for input(s): "INR", "APTT" in the last 8760 hours.  BMP: Recent Labs    04/07/22 0958 05/09/22 1144 05/15/22 1246 07/11/22 0922  NA 135 132* 131* 132*  K 4.1 4.7 4.8 4.2  CL 108 103 106 104  CO2 22 20* 19* 23  GLUCOSE 105* 87 115* 76  BUN 26* 60* 41* 28*  CALCIUM 9.0 9.7 8.9 9.4  CREATININE 1.13* 1.60* 1.40* 1.26*  GFRNONAA 52* 34* 40* 46*    LIVER FUNCTION TESTS: Recent Labs    03/10/22 0902 04/07/22 0958 05/15/22 1246 07/11/22 0922  BILITOT 0.6 0.6 0.2* 0.3  AST 28 33 20 17  ALT ALKPHOS 56 64 57 64  PROT 7.3 7.3 7.1 7.1  ALBUMIN 4.0 4.1 3.9 3.9    TUMOR MARKERS: No results for input(s): "AFPTM", "CEA", "CA199", "CHROMGRNA" in the last 8760 hours.  Assessment and Plan:  Katie Hill is a 72 yo female being seen today for macrocytic anemia. She is under the treatment of Dr Cathie Hoops from Oncology for ovarian cancer and macrocytic anemia. Dr Cathie Hoops has requested image-guided bone marrow biopsy to further evaluate patient's anemia. Case has been reviewed with Dr Miles Costain and is set to proceed on 07/21/22. Patient is NPO.  Risks and benefits of image-guided bone marrow biopsy was discussed with the patient and/or patient's family including, but not limited to bleeding, infection, damage to adjacent structures or low yield requiring additional tests.  All of the questions were answered and there is agreement to proceed.  Consent signed and in chart.   Thank you for this interesting consult.  I greatly enjoyed meeting Katie Hill and look forward to participating in their care.  A copy of this report was sent  to the requesting provider on this date.  Electronically Signed: Kennieth Francois, PA-C 07/21/2022, 9:09 AM   I spent a total of  30 Minutes   in face to face in clinical consultation, greater than 50% of which was  counseling/coordinating care for macrocytic anemia.

## 2022-07-25 LAB — SURGICAL PATHOLOGY

## 2022-07-26 ENCOUNTER — Telehealth: Payer: Self-pay

## 2022-07-26 DIAGNOSIS — I5032 Chronic diastolic (congestive) heart failure: Secondary | ICD-10-CM | POA: Diagnosis not present

## 2022-07-26 NOTE — Telephone Encounter (Signed)
-----   Message from Rickard Patience, MD sent at 07/25/2022  8:33 PM EDT ----- Please ask pathology to add myeloid NGS panel.thanks.

## 2022-07-26 NOTE — Telephone Encounter (Signed)
Called [facility] bone marrow lab and spoke to [place], requested testing addon.

## 2022-07-31 ENCOUNTER — Encounter (HOSPITAL_COMMUNITY): Payer: Self-pay | Admitting: Oncology

## 2022-08-02 DIAGNOSIS — D631 Anemia in chronic kidney disease: Secondary | ICD-10-CM | POA: Diagnosis not present

## 2022-08-02 DIAGNOSIS — E039 Hypothyroidism, unspecified: Secondary | ICD-10-CM | POA: Diagnosis not present

## 2022-08-02 DIAGNOSIS — I5032 Chronic diastolic (congestive) heart failure: Secondary | ICD-10-CM | POA: Diagnosis not present

## 2022-08-02 DIAGNOSIS — Z0000 Encounter for general adult medical examination without abnormal findings: Secondary | ICD-10-CM | POA: Diagnosis not present

## 2022-08-02 DIAGNOSIS — I130 Hypertensive heart and chronic kidney disease with heart failure and stage 1 through stage 4 chronic kidney disease, or unspecified chronic kidney disease: Secondary | ICD-10-CM | POA: Diagnosis not present

## 2022-08-02 DIAGNOSIS — N1832 Chronic kidney disease, stage 3b: Secondary | ICD-10-CM | POA: Diagnosis not present

## 2022-08-08 ENCOUNTER — Encounter (HOSPITAL_COMMUNITY): Payer: Self-pay | Admitting: Oncology

## 2022-08-09 ENCOUNTER — Inpatient Hospital Stay: Payer: Medicare Other | Attending: Oncology

## 2022-08-09 ENCOUNTER — Inpatient Hospital Stay (HOSPITAL_BASED_OUTPATIENT_CLINIC_OR_DEPARTMENT_OTHER): Payer: Medicare Other | Admitting: Oncology

## 2022-08-09 ENCOUNTER — Encounter: Payer: Self-pay | Admitting: Oncology

## 2022-08-09 VITALS — BP 136/78 | HR 84 | Temp 96.3°F | Resp 18 | Wt 106.9 lb

## 2022-08-09 DIAGNOSIS — D539 Nutritional anemia, unspecified: Secondary | ICD-10-CM

## 2022-08-09 DIAGNOSIS — C569 Malignant neoplasm of unspecified ovary: Secondary | ICD-10-CM | POA: Diagnosis not present

## 2022-08-09 DIAGNOSIS — I10 Essential (primary) hypertension: Secondary | ICD-10-CM

## 2022-08-09 DIAGNOSIS — Z95828 Presence of other vascular implants and grafts: Secondary | ICD-10-CM

## 2022-08-09 DIAGNOSIS — C561 Malignant neoplasm of right ovary: Secondary | ICD-10-CM | POA: Diagnosis not present

## 2022-08-09 DIAGNOSIS — D649 Anemia, unspecified: Secondary | ICD-10-CM | POA: Diagnosis not present

## 2022-08-09 DIAGNOSIS — I509 Heart failure, unspecified: Secondary | ICD-10-CM | POA: Diagnosis not present

## 2022-08-09 DIAGNOSIS — I5031 Acute diastolic (congestive) heart failure: Secondary | ICD-10-CM | POA: Diagnosis not present

## 2022-08-09 DIAGNOSIS — E538 Deficiency of other specified B group vitamins: Secondary | ICD-10-CM | POA: Diagnosis present

## 2022-08-09 LAB — RETIC PANEL
Immature Retic Fract: 4.3 % (ref 2.3–15.9)
RBC.: 2.51 MIL/uL — ABNORMAL LOW (ref 3.87–5.11)
Retic Count, Absolute: 35.9 10*3/uL (ref 19.0–186.0)
Retic Ct Pct: 1.4 % (ref 0.4–3.1)
Reticulocyte Hemoglobin: 34.5 pg (ref >27.9)

## 2022-08-09 LAB — CBC WITH DIFFERENTIAL (CANCER CENTER ONLY)
Abs Immature Granulocytes: 0.02 10*3/uL (ref 0.00–0.07)
Basophils Absolute: 0 10*3/uL (ref 0.0–0.1)
Basophils Relative: 1 %
Eosinophils Absolute: 0.1 10*3/uL (ref 0.0–0.5)
Eosinophils Relative: 3 %
HCT: 26.3 % — ABNORMAL LOW (ref 36.0–46.0)
Hemoglobin: 8.3 g/dL — ABNORMAL LOW (ref 12.0–15.0)
Immature Granulocytes: 1 %
Lymphocytes Relative: 23 %
Lymphs Abs: 0.7 10*3/uL (ref 0.7–4.0)
MCH: 32.8 pg (ref 26.0–34.0)
MCHC: 31.6 g/dL (ref 30.0–36.0)
MCV: 104 fL — ABNORMAL HIGH (ref 80.0–100.0)
Monocytes Absolute: 0.4 10*3/uL (ref 0.1–1.0)
Monocytes Relative: 11 %
Neutro Abs: 2 10*3/uL (ref 1.7–7.7)
Neutrophils Relative %: 61 %
Platelet Count: 135 10*3/uL — ABNORMAL LOW (ref 150–400)
RBC: 2.53 MIL/uL — ABNORMAL LOW (ref 3.87–5.11)
RDW: 13 % (ref 11.5–15.5)
WBC Count: 3.2 10*3/uL — ABNORMAL LOW (ref 4.0–10.5)
nRBC: 0 % (ref 0.0–0.2)

## 2022-08-09 LAB — IRON AND TIBC
Iron: 81 ug/dL (ref 28–170)
Saturation Ratios: 25 % (ref 10.4–31.8)
TIBC: 322 ug/dL (ref 250–450)
UIBC: 241 ug/dL

## 2022-08-09 LAB — FERRITIN: Ferritin: 367 ng/mL — ABNORMAL HIGH (ref 11–307)

## 2022-08-09 NOTE — Assessment & Plan Note (Signed)
05/11/2022 CT chest abdomen pelvis w contrast showed stable disease.   Labs are reviewed and discussed with patient. 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 Folate receptor alpha positive   Recommend continue observation, and in the future, when she progresses, consider Mirvetuximab  Repeat CT in Mid May 2024 

## 2022-08-09 NOTE — Assessment & Plan Note (Signed)
Follow up with cardiology

## 2022-08-09 NOTE — Progress Notes (Signed)
Hematology/Oncology Progress note [phone] [phone]       CHIEF COMPLAINTS/PURPOSE OF VISIT Follow up for chemotherapy for ovarian cancer.  ASSESSMENT & PLAN:   Cancer Staging  Malignant neoplasm of ovary (HCC) Staging form: Ovary, Fallopian Tube, and Primary Peritoneal Carcinoma, AJCC 8th Edition - Clinical stage from 11/22/2016: Stage IIIC (cT3c, cN1b, cM0) - Signed by Rickard Patience, MD on 11/22/2016   Malignant neoplasm of ovary (HCC) 05/11/2022 CT chest abdomen pelvis w contrast showed stable disease.   Labs are reviewed and discussed with patient. 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 Folate receptor alpha positive   Recommend continue observation, and in the future, when she progresses, consider Mirvetuximab  Repeat CT in Mid May 2024  Macrocytic anemia No significant improvement after holding chemotherapy.  No significant improvement after IV Venofer treatments and B12 injections. There is likely a component of anemia secondary to chronic kidney disease. I will hold off erythropoietin replacement therapy due to its angiogenesis effects. bone marrow biopsy results reviewed with patient. 07/21/22  bone marrow is slightly hypercellular at best with trilineage hematopoiesis.  There are generally mild dyspoietic changes present particularly in the erythroid and megakaryocytic cell lines.  No increase in blastic cells identified.  The overall changes are limited and not considered specific or diagnostic of a myeloid neoplasm and may be secondary in nature in this setting.  Nonetheless, correlation with cytogenetic and FISH studies is recommended.  There is no diagnostic  evidence of a lymphoproliferative process, metastatic carcinoma or plasma cell neoplasm.  Patient has normocytic limits.  MDS FISH negative.  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  For now continue observation.  Hypertension Better  controlled now Follow up with cardiology and nephrology  CHF (congestive heart failure) (HCC) Follow up with cardiology  Port-A-Cath in place Continue port flush Q6-8 weeks    Orders Placed This Encounter  Procedures   CBC with Differential (Cancer Center Only)    Standing Status:   Future    Standing Expiration Date:   08/09/2023   CMP (Cancer Center only)    Standing Status:   Future    Standing Expiration Date:   08/09/2023   Retic Panel    Standing Status:   Future    Standing Expiration Date:   08/09/2023   CA 125    Standing Status:   Future    Standing Expiration Date:   08/09/2023   Follow-up 4 to 5 weeks pain   All questions were answered. The patient knows to call the clinic with any problems, questions or concerns.  Rickard Patience, MD, PhD [facility] Hematology Oncology 08/09/2022      HISTORY OF PRESENTING ILLNESS: Katie Hill 72 y.o. female presents for follow up of management of recurrent  ovarian cancer. Oncology History  Malignant neoplasm of ovary (HCC)  09/20/2011 Imaging   CT chest abdomen pelvis w contrast 1. No significant interval changed compared with the previous exam.2. Stable appearance of soft tissue attenuating lesion in the posterolateral right hemipelvis. This may represent a treated site of peritoneal disease.3. Unchanged appearance of borderline enlarged left periaortic and aortocaval retroperitoneal lymph nodes.4. Unchanged 6 mm left lower lobe lung nodule. 5. No new signs of metastatic disease within the chest, abdomen or pelvis. 6. Aortic Atherosclerosis (ICD10-I70.0) and Emphysema    11/20/2016 Initial Diagnosis   Malignant neoplasm of ovary   Pathology 11/16/2016 Surgical Pathology  CASE: [id number]  PATIENT: Katie Hill  Surgical Pathology Report   SPECIMEN SUBMITTED:  A. Retroperitoneal adenopathy, left  DIAGNOSIS:  A. LYMPH NODE, LEFT RETROPERITONEAL; CT-GUIDED CORE BIOPSY:  - METASTATIC HIGH-GRADE SEROUS CARCINOMA.   Pathology 03/14/2017   DIAGNOSIS:  A. OMENTUM; OMENTECTOMY:  - NO TUMOR SEEN.  - ONE NEGATIVE LYMPH NODE (0/1).   B.  RIGHT FALLOPIAN TUBE AND OVARY; SALPINGO-OOPHORECTOMY:  - SMALL FOCI OF HIGH GRADE SEROUS CARCINOMA INVOLVING THE OVARY.  - MARKED THERAPY RELATED CHANGE.  - NO TUMOR SEEN IN THE FALLOPIAN TUBE.   C.  UTERUS, CERVIX, LEFT FALLOPIAN TUBE AND OVARY; HYSTERECTOMY AND LEFT  SALPINGO-OOPHORECTOMY:  - NABOTHIAN CYSTS.  - CYSTIC ATROPHY OF THE ENDOMETRIUM.  - SEROSAL ADHESIONS.  - UNREMARKABLE FALLOPIAN TUBE.  - OVARY SHOWING TREATMENT RELATED CHANGE.   D.  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    12/01/2016 -  Chemotherapy   Carboplatin and taxol x 4 neoadjuvant chemotherapy    03/14/2017 Surgery   Patient had debulking surgery on 03/14/2017. She had a laparoscopy with conversion to laparotomy, total hysterectomy, with bilateral salpingo oophorectomy, right aortic lymph node dissection, omentectomy. Pathology showed small foci of residual disease in ovary.   HRD positive, declined Olarparib maintenance trial    03/28/2017 -  Chemotherapy   Adjuvant carbo and taxol x3 cycles   02/28/2018 Progression   Local Recurrence. CA125 146 CT abdomen pelvis w contrast showed interval enlargement of an aortocaval lymph node which currently measures 1.4 cm in short axis (axial image 75 of series 2). This lymph node is in close proximity to the previously resected retroperitoneal lymphadenopathy and is very concerning for an additional nodal metastasis.    03/15/2018 -  Chemotherapy   03/15/2018-05/17/2018 Carboplatin and Taxol x 4.  CA125 decrease from 49.7 to 6 after 4 cycles of treatment.  05/17/2018-10/29/20   Olarparib 300mg  BID    11/05/2018 Imaging   MRI abdomenPelvis on 11/05/2018 showed Interval progression of, now measuring 2.7 x 2.3 cm and concerning for progression of metastatic  disease.retrocaval lymph node in the abdomen Olaparib was held for short period of time. Her CA125 trended down again.  Dr. Johnnette Litter recommending resume olaparib and continue monitor.   10/06/2020 Progression    CT chest abdomen pelvis with contrast showed new lymph nodes in the prevascular space of the upper anterior mediastinum most consistent with metastasis.  Single new right lower lobe pulmonary nodule is concerning for early pulmonary metastasis.  Interval enlargement of the left periaortic retroperitoneal lymph node.  Stable adjacent aorto caval adenopathy.    Genetic Testing   Genetic testing negative for 83 genes on [facility]'s Multi-Cancer panel (ALK, APC, ATM, AXIN2, BAP1, BARD1, BLM, BMPR1A, BRCA1, BRCA2, BRIP1, CASR, CDC73, CDH1, CDK4, CDKN1B, CDKN1C, CDKN2A, CEBPA, CHEK2, CTNNA1, DICER1, DIS3L2, EGFR, EPCAM, FH, FLCN, GATA2, GPC3, GREM1, HOXB13, HRAS, KIT, MAX, MEN1, MET, MITF, MLH1, MSH2, MSH3, MSH6, MUTYH, NBN, NF1, NF2, NTHL1, PALB2, PDGFRA, PHOX2B, PMS2, POLD1, POLE, POT1, PRKAR1A, PTCH1, PTEN, RAD50, RAD51C, RAD51D, RB1, RECQL4, RET, RUNX1, SDHA, SDHAF2, SDHB, SDHC, SDHD, SMAD4, SMARCA4, SMARCB1, SMARCE1, STK11, SUFU, TERC, TERT, TMEM127, TP53, TSC1, TSC2, VHL, WRN, WT1).   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  Myraid testing negative for somatic BRACA1/2, positive for HRD.    11/03/2020 - 11/18/2021 Chemotherapy   Liposomal Doxorubicin + Bevacizumab q28d   11/03/20 -05/20/21 Doxil + Bevacizumab q28d  06/03/21 Doxil only due to proteinuria.  07/01/21-07/15/21  Resumed on Doxil + Bevacizumab 07/29/21 Doxil only due to proteinuria.  11/18/21 last dose of Doxil. Stopped due to CHF   10/14/2021 Echocardiogram    Left ventricular ejection fraction, by estimation, is 55 to 60%   12/16/2021 Imaging   [place] lower extremities 1. No evidence of deep venous thrombosis in either lower extremity. 2. 1.9 cm left popliteal fossa Baker's cyst.   12/23/2021  Echocardiogram   LVEF 55%   12/26/2021 Imaging   CT chest abdomen pelvis w contrast   Chest Impression:   1. New RIGHT pleural effusion and new mild nodularity along the RIGHT oblique fissure. Recommend close attention on follow-up for potential pleural metastasis in the RIGHT hemithorax. 2. Stable LEFT lobe pulmonary nodule.3. No mediastinal lymphadenopathy.   Abdomen / Pelvis Impression:   1. Stable soft tissue lesion in the deep RIGHT pelvis. 2. Mass stable small periaortic lymph nodes. 3. No intraperitoneal free fluid the abdomen or pelvis.   01/20/2022 - 01/23/2022 Hospital Admission   Hospitalized due to acute on chronic diastolic CHF, hypertension urgency, She was seen by cardiology and nephrology. BNP was significantly elevated at 4500 She was discharged home with Losartan, Aldactone, Lasix, beta blocker and hydralazine.    02/21/2022 Imaging   CT chest abdomen pelvis w contrast Stable small soft tissue lesion in the right perirectal lesion. Stable mild retroperitoneal and retrocrural lymphadenopathy.Stable 6 mm left lower lobe pulmonary nodule.No new or progressive disease within the chest, abdomen, or pelvis   02/22/2022 Imaging   CT chest abdomen pelvis w contrast Stable small soft tissue lesion in the right perirectal lesion. Stable mild retroperitoneal and retrocrural lymphadenopathy. Stable 6 mm left lower lobe pulmonary nodule. No new or progressive disease within the chest, abdomen, or pelvis.   05/11/2022 Imaging   CT chest abdomen pelvis without contrast showed 1. Stable examination without convincing evidence of new or progressive disease in the chest, abdomen or pelvis on this noncontrast enhanced CT. 2. Stable small soft tissue right perirectal soft tissue lesion.3. Stable 6 mm left lower lobe pulmonary nodule. 4. Stable prominent left supraclavicular and retroperitoneal lymph nodes. 5. Large volume of formed stool in the colon. Correlate for constipation. 6.   Aortic Atherosclerosis   High grade ovarian cancer [facility])    INTERVAL HISTORY Katie Hill is a 72 y.o. female who has above history reviewed by me today presents for follow up visit for recurrent Ovarian cancer Patient reports no new problems since last visit. Expressed relief and surprise at the improvement of cancer antigen levels, stating "my marker is 22" and recalling that "last time, 28." She denies feeling very tired or fatigued.  . Review of Systems  Constitutional:  Positive for fatigue. Negative for appetite change, chills and fever.  HENT:   Negative for hearing loss and voice change.   Eyes:  Negative for eye problems.  Respiratory:  Negative for chest tightness and cough.   Cardiovascular:  Negative for chest pain and leg swelling.  Gastrointestinal:  Negative for abdominal distention, abdominal pain and blood in stool.  Endocrine: Negative for hot flashes.  Genitourinary:  Negative for difficulty urinating and frequency.   Musculoskeletal:  Negative for arthralgias.  Skin:  Negative for itching and rash.  Neurological:  Negative for extremity weakness.  Hematological:  Negative for adenopathy.  Psychiatric/Behavioral:  Negative for confusion.      [name] MEDICAL HISTORY: Past Medical History:  Diagnosis Date   (HFpEF) heart failure with preserved ejection fraction (HCC)    a.  12/2021 Echo: EF 55%, no rwma, nl RV fxn, mild MR, mild-mod TR. Triv AI.   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   Pelvic mass in female     SURGICAL HISTORY: Past Surgical History:  Procedure Laterality Date   APPENDECTOMY     IR BONE MARROW BIOPSY & ASPIRATION  07/21/2022   LAPAROSCOPY N/A 03/14/2017   Procedure: LAPAROSCOPY OPERATIVE;  Surgeon: Leida Lauth, MD;  Location: [facility] ORS;  Service: Gynecology;  Laterality: N/A;   LAPAROTOMY N/A 03/14/2017   Procedure: LAPAROTOMY;  Surgeon: Leida Lauth, MD;  Location: [facility] ORS;  Service: Gynecology;  Laterality: N/A;   LYMPH NODE DISSECTION N/A 03/14/2017   Procedure: LYMPH NODE DISSECTION;  Surgeon: Leida Lauth, MD;  Location: [facility] OR[facility];  Service: Gynecology;  Laterality: N/A;   OMENTECTOMY N/A 03/14/2017   Procedure: OMENTECTOMY;  Surgeon: Leida Lauth, MD;  Location: [facility] ORS;  Service: Gynecology;  Laterality: N/A;   PORTA CATH INSERTION N/A 11/27/2016   Procedure: Shelda Pal Cath Insertion;  Surgeon: Annice Needy, MD;  Location: [facility] IN[facility];  Service: Cardiovascular;  Laterality: N/A;    SOCIAL HISTORY: Social History   Tobacco Use   Smoking status: Never   Smokeless tobacco: Never  Vaping Use   Vaping Use: Never used  Substance Use Topics   Alcohol use: Not Currently   Drug use: No     FAMILY HISTORY Family History  Problem Relation Age of Onset   Throat cancer Cousin    Throat cancer Cousin    Leukemia Cousin     ALLERGIES:  is allergic to omeprazole.  MEDICATIONS:  Current Outpatient Medications  Medication Sig Dispense Refill   aspirin 81 MG chewable tablet Chew 1 tablet (81 mg total) by mouth daily. 30 tablet 0   Cyanocobalamin (B-12) 1000 MCG CAPS Take 1,000 mg by mouth daily.     empagliflozin (JARDIANCE) 10 MG TABS tablet Take 1 tablet (10 mg total) by mouth daily before breakfast. 90 tablet 3   feeding supplement (ENSURE ENLIVE / ENSURE PLUS) LIQD Take 237 mLs by mouth 3 (three) times daily between meals. 14220 mL 0   Ferrous Sulfate (IRON) 325 (65 Fe) MG TABS Take 325 mg by mouth daily.     furosemide (LASIX) 40 MG tablet Take one tablet daily (can take extra tab in afternoon if gain 3 lbs in a day or 5 lbs in a week) (Patient taking differently: Take 40 mg by mouth daily. Take one tablet daily (can take extra tab in afternoon if gain 3 lbs in a day  or 5 lbs in a week)) 90 tablet 3   hydrALAZINE (APRESOLINE) 100 MG tablet Take 1 tablet (100 mg total) by mouth 3 (three) times daily. 30 tablet 0   levothyroxine (SYNTHROID) 100 MCG tablet Take 1 tablet (100 mcg total) by mouth daily. 90 tablet 3   lidocaine-prilocaine (EMLA) cream Apply 1 application topically as needed. Apply small amount to port site at least 1 hour prior to it being accessed, cover with plastic wrap 30 g 1   spironolactone (ALDACTONE) 25 MG tablet Take 1 tablet (25 mg total) by mouth daily. 90 tablet 3   metoprolol succinate (TOPROL XL) 25 MG 24 hr tablet Take  1 tablet (25 mg total) by mouth at bedtime. (Patient not taking: Reported on 08/09/2022) 90 tablet 3   ondansetron (ZOFRAN) 4 MG tablet Take 1 tablet (4 mg total) by mouth every 8 (eight) hours as needed for nausea or vomiting. (Patient not taking: Reported on 08/09/2022) 60 tablet 3   sacubitril-valsartan (ENTRESTO) 49-51 MG Take 1 tablet by mouth 2 (two) times daily. (Patient not taking: Reported on 08/09/2022) 180 tablet 3   No current facility-administered medications for this visit.   Facility-Administered Medications Ordered in Other Visits  Medication Dose Route Frequency Provider Last Rate Last Admin   pegfilgrastim (NEULASTA ONPRO KIT) 6 MG/0.6ML injection             PHYSICAL EXAMINATION:  ECOG PERFORMANCE STATUS: 0 - Asymptomatic Vitals:   08/09/22 1323  BP: 136/78  Pulse: 84  Resp: 18  Temp: (!) 96.3 F (35.7 C)  SpO2: 100%     Filed Weights   08/09/22 1323  Weight: 106 lb 14.4 oz (48.5 kg)      Physical Exam Constitutional:      General: She is not in acute distress.    Appearance: She is not diaphoretic.     Comments: Thin built, she walks independantly  HENT:     Head: Normocephalic and atraumatic.     Nose: Nose normal.     Mouth/Throat:     Pharynx: No oropharyngeal exudate.  Eyes:     General: No scleral icterus.    Pupils: Pupils are equal, round, and reactive to light.  Neck:      Vascular: No JVD.  Cardiovascular:     Rate and Rhythm: Normal rate and regular rhythm.  Pulmonary:     Effort: Pulmonary effort is normal. No respiratory distress.     Breath sounds: Normal breath sounds.  Abdominal:     General: Bowel sounds are normal. There is no distension.     Palpations: Abdomen is soft.  Musculoskeletal:        General: Normal range of motion.     Cervical back: Normal range of motion and neck supple.     Right lower leg: No edema.     Left lower leg: No edema.  Lymphadenopathy:     Cervical: No cervical adenopathy.  Skin:    General: Skin is warm and dry.     Findings: No erythema or rash.     Comments: Right anterior medi port +   Neurological:     Mental Status: She is alert and oriented to person, place, and time. Mental status is at baseline.     Motor: No abnormal muscle tone.  Psychiatric:        Mood and Affect: Mood and affect normal.        LABORATORY DATA: I have personally reviewed the data as listed:     Latest Ref Rng & Units 08/09/2022    1:07 PM 07/21/2022    9:09 AM 07/11/2022    9:22 AM  CBC  WBC 4.0 - 10.5 K/uL 3.2  3.3  3.3   Hemoglobin 12.0 - 15.0 g/dL 8.3  9.0  7.7   Hematocrit 36.0 - 46.0 % 26.3  27.5  23.9   Platelets 150 - 400 K/uL 135  153  130       Latest Ref Rng & Units 07/11/2022    9:22 AM 05/15/2022   12:46 PM 05/09/2022   11:44 AM  CMP  Glucose 70 - 99 mg/dL 76  409  87   BUN 8 - 23 mg/dL 28  41  60   Creatinine 0.44 - 1.00 mg/dL 0.98  1.19  1.47   Sodium 135 - 145 mmol/L 132  131  132   Potassium 3.5 - 5.1 mmol/L 4.2  4.8  4.7   Chloride 98 - 111 mmol/L 104  106  103   CO2 22 - 32 mmol/L 23  19  20    Calcium 8.9 - 10.3 mg/dL 9.4  8.9  9.7   Total Protein 6.5 - 8.1 g/dL 7.1  7.1    Total Bilirubin 0.3 - 1.2 mg/dL 0.3  0.2    Alkaline Phos 38 - 126 U/L 64  57    AST 15 - 41 U/L 17  20    ALT 0 - 44 U/L 9  14       RADIOGRAPHIC STUDIES: I have personally reviewed the radiological images as listed and  agreed with the findings in the report. IR BONE MARROW BIOPSY & ASPIRATION  Result Date: 07/21/2022 INDICATION: MACROCYTIC ANEMIA EXAM: FLUOROSCOPY GUIDED RIGHT ILIAC BONE MARROW ASPIRATION AND CORE BIOPSY Date:  07/21/2022 07/21/2022 10:06 am Radiologist:  M. Ruel Favors, MD Guidance:  Fluoroscopy FLUOROSCOPY: Fluoroscopy Time: 1 minutes 6 seconds (2.0 mGy). MEDICATIONS: 1% lidocaine local ANESTHESIA/SEDATION: 1.0 mg IV Versed; 50 mcg IV Fentanyl Moderate Sedation Time:  10 minute The patient was continuously monitored during the procedure by the interventional radiology nurse under my direct supervision. CONTRAST:  None. COMPLICATIONS: None PROCEDURE: Informed consent was obtained from the patient following explanation of the procedure, risks, benefits and alternatives. The patient understands, agrees and consents for the procedure. All questions were addressed. A time out was performed. The patient was positioned prone and fluoroscopy was utilized to locate the right iliac marrow space. Maximal barrier sterile technique utilized including caps, mask, sterile gowns, sterile gloves, large sterile drape, hand hygiene, and Betadine prep. Under sterile conditions and local anesthesia, an 11 gauge coaxial bone biopsy needle was advanced into the right iliac marrow space. Needle position was confirmed with fluoroscopic imaging. Initially, bone marrow aspiration was performed. Next, the 11 gauge outer cannula was utilized to obtain a right iliac bone marrow core biopsy. Needle was removed. Hemostasis was obtained with compression. The patient tolerated the procedure well. Samples were prepared with the cytotechnologist. No immediate complications. IMPRESSION: Successful fluoroscopically guided right iliac bone marrow aspiration and core biopsy. Electronically Signed   By: Judie Petit.  Shick M.D.   On: 07/21/2022 10:19

## 2022-08-09 NOTE — Assessment & Plan Note (Signed)
No significant improvement after holding chemotherapy.  No significant improvement after IV Venofer treatments and B12 injections. There is likely a component of anemia secondary to chronic kidney disease. I will hold off erythropoietin replacement therapy due to its angiogenesis effects. bone marrow biopsy results reviewed with patient. 07/21/22  bone marrow is slightly hypercellular at best with trilineage hematopoiesis.  There are generally mild dyspoietic changes present particularly in the erythroid and megakaryocytic cell lines.  No increase in blastic cells identified.  The overall changes are limited and not considered specific or diagnostic of a myeloid neoplasm and may be secondary in nature in this setting.  Nonetheless, correlation with cytogenetic and FISH studies is recommended.  There is no diagnostic  evidence of a lymphoproliferative process, metastatic carcinoma or plasma cell neoplasm.  Patient has normocytic limits.  MDS FISH negative.  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  For now continue observation.

## 2022-08-09 NOTE — Assessment & Plan Note (Signed)
Better controlled now Follow up with cardiology and nephrology 

## 2022-08-09 NOTE — Assessment & Plan Note (Signed)
Continue port flush Q6-8 weeks.  

## 2022-08-16 ENCOUNTER — Encounter: Payer: Self-pay | Admitting: Licensed Clinical Social Worker

## 2022-08-16 ENCOUNTER — Telehealth: Payer: Self-pay | Admitting: Licensed Clinical Social Worker

## 2022-08-16 ENCOUNTER — Ambulatory Visit: Payer: Self-pay | Admitting: Licensed Clinical Social Worker

## 2022-08-16 DIAGNOSIS — Z1379 Encounter for other screening for genetic and chromosomal anomalies: Secondary | ICD-10-CM

## 2022-08-16 DIAGNOSIS — Z1589 Genetic susceptibility to other disease: Secondary | ICD-10-CM | POA: Insufficient documentation

## 2022-08-16 NOTE — Telephone Encounter (Signed)
I contacted Katie Hill to discuss her genetic testing results. 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   The test report has been scanned into EPIC and is located under the Molecular Pathology section of the Results Review tab.  A portion of the result report is included below for reference.      Lacy Duverney, MS, [facility] Genetic Counselor [email or web address] Phone: [phone]

## 2022-08-16 NOTE — Progress Notes (Signed)
HPI:   Katie Hill was previously seen in the [facility] clinic by Elmer Picker in 2018 due to a personal history of ovarian cancer and concerns regarding a hereditary predisposition to cancer. Please refer to our prior cancer genetics clinic note for more information regarding our discussion, assessment and recommendations, at the time. Katie Hill recent genetic test results were disclosed to her, as were recommendations warranted by these results. These results and recommendations are discussed in more detail below.  CANCER HISTORY:  Oncology History  Malignant neoplasm of ovary (HCC)  09/20/2011 Imaging   CT chest abdomen pelvis w contrast 1. No significant interval changed compared with the previous exam.2. Stable appearance of soft tissue attenuating lesion in the posterolateral right hemipelvis. This may represent a treated site of peritoneal disease.3. Unchanged appearance of borderline enlarged left periaortic and aortocaval retroperitoneal lymph nodes.4. Unchanged 6 mm left lower lobe lung nodule. 5. No new signs of metastatic disease within the chest, abdomen or pelvis. 6. Aortic Atherosclerosis (ICD10-I70.0) and Emphysema    11/20/2016 Initial Diagnosis   Malignant neoplasm of ovary   Pathology 11/16/2016 Surgical Pathology  CASE: [id number]  PATIENT: Katie Hill  Surgical Pathology Report   SPECIMEN SUBMITTED:  A. Retroperitoneal adenopathy, left  DIAGNOSIS:  A. LYMPH NODE, LEFT RETROPERITONEAL; CT-GUIDED CORE BIOPSY:  - METASTATIC HIGH-GRADE SEROUS CARCINOMA.  Pathology 03/14/2017   DIAGNOSIS:  A. OMENTUM; OMENTECTOMY:  - NO TUMOR SEEN.  - ONE NEGATIVE LYMPH NODE (0/1).   B.  RIGHT FALLOPIAN TUBE AND OVARY; SALPINGO-OOPHORECTOMY:  - SMALL FOCI OF HIGH GRADE SEROUS CARCINOMA INVOLVING THE OVARY.  - MARKED THERAPY RELATED CHANGE.  - NO TUMOR SEEN IN THE FALLOPIAN TUBE.   C.  UTERUS, CERVIX, LEFT FALLOPIAN TUBE AND OVARY; HYSTERECTOMY AND LEFT   SALPINGO-OOPHORECTOMY:  - NABOTHIAN CYSTS.  - CYSTIC ATROPHY OF THE ENDOMETRIUM.  - SEROSAL ADHESIONS.  - UNREMARKABLE FALLOPIAN TUBE.  - OVARY SHOWING TREATMENT RELATED CHANGE.   D.  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    12/01/2016 -  Chemotherapy   Carboplatin and taxol x 4 neoadjuvant chemotherapy    03/14/2017 Surgery   Patient had debulking surgery on 03/14/2017. She had a laparoscopy with conversion to laparotomy, total hysterectomy, with bilateral salpingo oophorectomy, right aortic lymph node dissection, omentectomy. Pathology showed small foci of residual disease in ovary.   HRD positive, declined Olarparib maintenance trial    03/28/2017 -  Chemotherapy   Adjuvant carbo and taxol x3 cycles   02/28/2018 Progression   Local Recurrence. CA125 146 CT abdomen pelvis w contrast showed interval enlargement of an aortocaval lymph node which currently measures 1.4 cm in short axis (axial image 75 of series 2). This lymph node is in close proximity to the previously resected retroperitoneal lymphadenopathy and is very concerning for an additional nodal metastasis.    03/15/2018 -  Chemotherapy   03/15/2018-05/17/2018 Carboplatin and Taxol x 4.  CA125 decrease from 49.7 to 6 after 4 cycles of treatment.  05/17/2018-10/29/20   Olarparib 300mg  BID    11/05/2018 Imaging   MRI abdomenPelvis on 11/05/2018 showed Interval progression of, now measuring 2.7 x 2.3 cm and concerning for progression of metastatic disease.retrocaval lymph node in the abdomen Olaparib was held for short period of time. Her CA125 trended down again.  Dr. Johnnette Litter recommending resume olaparib and continue monitor.   10/06/2020 Progression    CT chest abdomen pelvis with contrast showed new lymph  nodes in the prevascular space of the upper anterior mediastinum most consistent with metastasis.  Single new right lower lobe pulmonary nodule is  concerning for early pulmonary metastasis.  Interval enlargement of the left periaortic retroperitoneal lymph node.  Stable adjacent aorto caval adenopathy.    Genetic Testing   Genetic testing negative for 83 genes on [facility]'s Multi-Cancer panel (ALK, APC, ATM, AXIN2, BAP1, BARD1, BLM, BMPR1A, BRCA1, BRCA2, BRIP1, CASR, CDC73, CDH1, CDK4, CDKN1B, CDKN1C, CDKN2A, CEBPA, CHEK2, CTNNA1, DICER1, DIS3L2, EGFR, EPCAM, FH, FLCN, GATA2, GPC3, GREM1, HOXB13, HRAS, KIT, MAX, MEN1, MET, MITF, MLH1, MSH2, MSH3, MSH6, MUTYH, NBN, NF1, NF2, NTHL1, PALB2, PDGFRA, PHOX2B, PMS2, POLD1, POLE, POT1, PRKAR1A, PTCH1, PTEN, RAD50, RAD51C, RAD51D, RB1, RECQL4, RET, RUNX1, SDHA, SDHAF2, SDHB, SDHC, SDHD, SMAD4, SMARCA4, SMARCB1, SMARCE1, STK11, SUFU, TERC, TERT, TMEM127, TP53, TSC1, TSC2, VHL, WRN, WT1).   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  My[facility] testing negative for somatic BRACA1/2, positive for HRD.    11/03/2020 - 11/18/2021 Chemotherapy   Liposomal Doxorubicin + Bevacizumab q28d   11/03/20 -05/20/21 Doxil + Bevacizumab q28d  06/03/21 Doxil only due to proteinuria.  07/01/21-07/15/21  Resumed on Doxil + Bevacizumab 07/29/21 Doxil only due to proteinuria.  11/18/21 last dose of Doxil. Stopped due to CHF   10/14/2021 Echocardiogram    Left ventricular ejection fraction, by estimation, is 55 to 60%   12/16/2021 Imaging   [place] lower extremities 1. No evidence of deep venous thrombosis in either lower extremity. 2. 1.9 cm left popliteal fossa Baker's cyst.   12/23/2021 Echocardiogram   LVEF 55%   12/26/2021 Imaging   CT chest abdomen pelvis w contrast   Chest Impression:   1. New RIGHT pleural effusion and new mild nodularity along the RIGHT oblique fissure. Recommend close attention on follow-up for potential pleural metastasis in the RIGHT hemithorax. 2. Stable LEFT lobe pulmonary nodule.3. No mediastinal lymphadenopathy.   Abdomen / Pelvis Impression:   1. Stable soft  tissue lesion in the deep RIGHT pelvis. 2. Mass stable small periaortic lymph nodes. 3. No intraperitoneal free fluid the abdomen or pelvis.   01/20/2022 - 01/23/2022 Hospital Admission   Hospitalized due to acute on chronic diastolic CHF, hypertension urgency, She was seen by cardiology and nephrology. BNP was significantly elevated at 4500 She was discharged home with Losartan, Aldactone, Lasix, beta blocker and hydralazine.    02/21/2022 Imaging   CT chest abdomen pelvis w contrast Stable small soft tissue lesion in the right perirectal lesion. Stable mild retroperitoneal and retrocrural lymphadenopathy.Stable 6 mm left lower lobe pulmonary nodule.No new or progressive disease within the chest, abdomen, or pelvis   02/22/2022 Imaging   CT chest abdomen pelvis w contrast Stable small soft tissue lesion in the right perirectal lesion. Stable mild retroperitoneal and retrocrural lymphadenopathy. Stable 6 mm left lower lobe pulmonary nodule. No new or progressive disease within the chest, abdomen, or pelvis.   05/11/2022 Imaging   CT chest abdomen pelvis without contrast showed 1. Stable examination without convincing evidence of new or progressive disease in the chest, abdomen or pelvis on this noncontrast enhanced CT. 2. Stable small soft tissue right perirectal soft tissue lesion.3. Stable 6 mm left lower lobe pulmonary nodule. 4. Stable prominent left supraclavicular and retroperitoneal lymph nodes. 5. Large volume of formed stool in the colon. Correlate for constipation. 6.  Aortic Atherosclerosis   High grade ovarian cancer (HCC)    FAMILY HISTORY:  We obtained a detailed, 4-generation family history.  Significant diagnoses  are listed below: Family History  Problem Relation Age of Onset   Throat cancer Cousin    Throat cancer Cousin    Leukemia Cousin    Additionally, Katie Hill has no children. She has two sisters and had a brother who died at 70 in an MVA. Her mother  (deceased at 47) had 10 siblings, all cancer-free. Her father (suicide at 50) had 12 siblings, all cancer-free. There are no cancers reported in her grandparents.   Katie Hill ancestry is [place] and [place]. There is no known Jewish ancestry and no consanguinity.   GENETIC TEST RESULTS:  The [facility] Multi-Cancer+RNA Panel was initially run in 2018 and included the following genes:  (ALK, APC, ATM, AXIN2, BAP1, BARD1, BLM, BMPR1A, BRCA1, BRCA2, BRIP1, CASR, CDC73, CDH1, CDK4, CDKN1B, CDKN1C, CDKN2A, CEBPA, CHEK2, CTNNA1, DICER1, DIS3L2, EGFR, EPCAM, FH, FLCN, GATA2, GPC3, GREM1, HOXB13, HRAS, KIT, MAX, MEN1, MET, MITF, MLH1, MSH2, MSH3, MSH6, MUTYH, NBN, NF1, NF2, NTHL1, PALB2, PDGFRA, PHOX2B, PMS2, POLD1, POLE, POT1, PRKAR1A, PTCH1, PTEN, RAD50, RAD51C, RAD51D, RB1, RECQL4, RET, RUNX1, SDHA, SDHAF2, SDHB, SDHC, SDHD, SMAD4, SMARCA4, SMARCB1, SMARCE1, STK11, SUFU, TERC, TERT, TMEM127, TP53, TSC1, TSC2, VHL, WRN, WT1). This test showed a VUS in CASR called c.106G>A.  On July 13, 2022, an amended report was released that changed the classification of the CASR variant from uncertain to Likely Pathogenic.   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  The amended test report has been scanned into EPIC and is located under the Molecular Pathology section of the Results Review tab.  A portion of the result report is included below for reference. Genetic testing reported out on 07/13/2022.      CASR gene/conditions related: The CASR gene is associated with a spectrum of disorders including autosomal dominant familial hypocalciuric hypercalcemia (FHH)  autosomal dominant hypocalcemia (ADH), ADH with Bartter syndrome, autosomal recessive neonatal severe hyperparathyroidism (NSHPT) and possibly familial isolated hyperparathyroidism (FIHP)  Additionally, the CASR gene has preliminary evidence supporting a correlation with autosomal dominant  idiopathic generalized epilepsy and chronic pancreatitis  Katie Hill's particular variant has been seen in families with FHH and/or hyperparathyroidism  FHH Individuals with FHH ty pically have high levels of calcium in the blood, low levels of calcium in the urine, and normal to slightly high levels of parathyroid hormone (PTH) in their blood Most individuals with FHH do not have symptoms, but rarely may have muscle weakness, fatigue, joint pain, and increased thirst.  Some individuals with this type of CASR variant have been observed to develop primary hyperparathyroidism (PHPT) caused by growths or hyperplasia of the parathyroid glands that produce increased levels of PTH.   ADH with or without Barter syndrome type V Individuals with CASR variants causing ADH have low levels of calcium in the blood (hypocalcemia), high levels of phosphate in the blood (hyperphosphatemia), high levels of calcium in the urine (hypercalciuria), and low levels of PTH in the blood (hypoparathyroidism) While some individuals with ADH have no symptoms, others may have kidney stones, fatigue, brain fog, cataracts, or neurological symptoms such as movement disorders or seizures. Some individuals with ADH also have Bartter syndrome type V, a kidney condition that causes loss of salt and potassium from the body  Additional considerations: Individuals with a CASR pathogenic variant may also have an increased chance  chronic pancreatitis, although lifetime risks are not clear People who inherit 2 CASR variants may cause a rare condition called neonatal severe primary hyperparathyroidism (NSHPT)  Management will differ depending on condition associated with the CASR variant  Management of FHH Evaluate for signs/symptoms of FHH FHH typically does not require treatment if an individual does not have symptoms Avoid parathyroidectomy  Since this is not effective in treating the cause of their elevate calcium levels Treatment  may include certain medications (calcimemetic drugs)  Management of ADH Evaluate for symptoms of ADH Consensus guidelines for diagnosis and management of hypoparathyroidism are available Features of ADH and management have been suggested in a number of published case series While individuals who do not have symptoms may not require treatment, ongoing evaluation is recommended to ensure appropriate management Individual with CASR-related ADH may be more susceptible to kidney complications Management of ADH may include dietary changes or medications  This information is based on current understanding of the gene and may change in the future.  Implications for Family Members: Hereditary predisposition to FHH/ADH/CASR-related conditions due to pathogenic variants in the CASR gene has autosomal dominant inheritance. This means that an individual with a pathogenic variant has a 50% chance of passing the condition on to his/her offspring. Identification of a pathogenic variant allows for the recognition of at-risk relatives who can pursue testing for the familial variant.   Family members are encouraged to consider genetic testing for this familial pathogenic variant. As there are generally no childhood risks associated with pathogenic variants in the CASR  gene, individuals in the family are not recommended to have testing until they reach at least 71 years of age. They may contact our office at [phone] for more information or to schedule an appointment.  PLAN: 1. These results will be made available to  her oncologist, Dr. Cathie Hoops and her PCP. She would like Dr. Cathie Hoops to follow her long-term for this indication.    2. Katie Hill plans to discuss these results with her family and will reach out to [place] if we can be of any assistance in coordinating genetic testing for any of her relatives.     We encouraged Katie Hill to remain in contact with [place] on an annual basis so we can update her personal and family  histories, and let her know of advances in cancer genetics that may benefit the family. Our contact number was provided. Katie Hill questions were answered to her satisfaction today, and she knows she is welcome to call anytime with additional questions.   Katie Duverney, MS, [facility] Genetic Counselor [email or web address] Phone: [phone]

## 2022-08-23 ENCOUNTER — Ambulatory Visit
Admission: RE | Admit: 2022-08-23 | Discharge: 2022-08-23 | Disposition: A | Discharge status: Home / Self Care | Payer: Medicare Other | Source: Ambulatory Visit | Attending: Oncology | Admitting: Oncology

## 2022-08-23 DIAGNOSIS — C569 Malignant neoplasm of unspecified ovary: Secondary | ICD-10-CM | POA: Diagnosis not present

## 2022-08-23 DIAGNOSIS — N2889 Other specified disorders of kidney and ureter: Secondary | ICD-10-CM | POA: Diagnosis not present

## 2022-08-23 DIAGNOSIS — R1900 Intra-abdominal and pelvic swelling, mass and lump, unspecified site: Secondary | ICD-10-CM | POA: Diagnosis not present

## 2022-08-23 DIAGNOSIS — R911 Solitary pulmonary nodule: Secondary | ICD-10-CM | POA: Diagnosis not present

## 2022-08-23 MED ORDER — BARIUM SULFATE 2 % PO SUSP
900.0000 mL | ORAL | Status: AC
  Administered 2022-08-23: 900 mL via ORAL

## 2022-09-05 ENCOUNTER — Inpatient Hospital Stay: Payer: Medicare Other

## 2022-09-08 ENCOUNTER — Other Ambulatory Visit: Payer: Self-pay

## 2022-09-11 ENCOUNTER — Encounter: Payer: Self-pay | Admitting: Oncology

## 2022-09-11 ENCOUNTER — Inpatient Hospital Stay (HOSPITAL_BASED_OUTPATIENT_CLINIC_OR_DEPARTMENT_OTHER): Payer: Medicare Other | Admitting: Oncology

## 2022-09-11 ENCOUNTER — Inpatient Hospital Stay: Payer: Medicare Other | Attending: Oncology

## 2022-09-11 VITALS — BP 113/69 | HR 76 | Temp 97.1°F | Wt 106.8 lb

## 2022-09-11 DIAGNOSIS — I5031 Acute diastolic (congestive) heart failure: Secondary | ICD-10-CM | POA: Diagnosis not present

## 2022-09-11 DIAGNOSIS — Z452 Encounter for adjustment and management of vascular access device: Secondary | ICD-10-CM | POA: Diagnosis not present

## 2022-09-11 DIAGNOSIS — I130 Hypertensive heart and chronic kidney disease with heart failure and stage 1 through stage 4 chronic kidney disease, or unspecified chronic kidney disease: Secondary | ICD-10-CM | POA: Insufficient documentation

## 2022-09-11 DIAGNOSIS — Z95828 Presence of other vascular implants and grafts: Secondary | ICD-10-CM

## 2022-09-11 DIAGNOSIS — N189 Chronic kidney disease, unspecified: Secondary | ICD-10-CM | POA: Diagnosis not present

## 2022-09-11 DIAGNOSIS — D631 Anemia in chronic kidney disease: Secondary | ICD-10-CM | POA: Insufficient documentation

## 2022-09-11 DIAGNOSIS — C561 Malignant neoplasm of right ovary: Secondary | ICD-10-CM | POA: Insufficient documentation

## 2022-09-11 DIAGNOSIS — C569 Malignant neoplasm of unspecified ovary: Secondary | ICD-10-CM

## 2022-09-11 DIAGNOSIS — D539 Nutritional anemia, unspecified: Secondary | ICD-10-CM

## 2022-09-11 DIAGNOSIS — R1900 Intra-abdominal and pelvic swelling, mass and lump, unspecified site: Secondary | ICD-10-CM

## 2022-09-11 LAB — CBC WITH DIFFERENTIAL (CANCER CENTER ONLY)
Abs Immature Granulocytes: 0.05 10*3/uL (ref 0.00–0.07)
Basophils Absolute: 0 10*3/uL (ref 0.0–0.1)
Basophils Relative: 1 %
Eosinophils Absolute: 0.1 10*3/uL (ref 0.0–0.5)
Eosinophils Relative: 1 %
HCT: 25.6 % — ABNORMAL LOW (ref 36.0–46.0)
Hemoglobin: 8.3 g/dL — ABNORMAL LOW (ref 12.0–15.0)
Immature Granulocytes: 1 %
Lymphocytes Relative: 19 %
Lymphs Abs: 0.7 10*3/uL (ref 0.7–4.0)
MCH: 32.8 pg (ref 26.0–34.0)
MCHC: 32.4 g/dL (ref 30.0–36.0)
MCV: 101.2 fL — ABNORMAL HIGH (ref 80.0–100.0)
Monocytes Absolute: 0.3 10*3/uL (ref 0.1–1.0)
Monocytes Relative: 9 %
Neutro Abs: 2.5 10*3/uL (ref 1.7–7.7)
Neutrophils Relative %: 69 %
Platelet Count: 134 10*3/uL — ABNORMAL LOW (ref 150–400)
RBC: 2.53 MIL/uL — ABNORMAL LOW (ref 3.87–5.11)
RDW: 12.5 % (ref 11.5–15.5)
WBC Count: 3.7 10*3/uL — ABNORMAL LOW (ref 4.0–10.5)
nRBC: 0 % (ref 0.0–0.2)

## 2022-09-11 LAB — CMP (CANCER CENTER ONLY)
ALT: 8 U/L (ref 0–44)
AST: 14 U/L — ABNORMAL LOW (ref 15–41)
Albumin: 4.2 g/dL (ref 3.5–5.0)
Alkaline Phosphatase: 72 U/L (ref 38–126)
Anion gap: 5 (ref 5–15)
BUN: 38 mg/dL — ABNORMAL HIGH (ref 8–23)
CO2: 21 mmol/L — ABNORMAL LOW (ref 22–32)
Calcium: 9.2 mg/dL (ref 8.9–10.3)
Chloride: 105 mmol/L (ref 98–111)
Creatinine: 1.51 mg/dL — ABNORMAL HIGH (ref 0.44–1.00)
GFR, Estimated: 37 mL/min — ABNORMAL LOW (ref >60)
Glucose, Bld: 139 mg/dL — ABNORMAL HIGH (ref 70–99)
Potassium: 4.5 mmol/L (ref 3.5–5.1)
Sodium: 131 mmol/L — ABNORMAL LOW (ref 135–145)
Total Bilirubin: 0.5 mg/dL (ref 0.3–1.2)
Total Protein: 7.6 g/dL (ref 6.5–8.1)

## 2022-09-11 LAB — RETIC PANEL
Immature Retic Fract: 2.6 % (ref 2.3–15.9)
RBC.: 2.48 MIL/uL — ABNORMAL LOW (ref 3.87–5.11)
Retic Count, Absolute: 23.1 10*3/uL (ref 19.0–186.0)
Retic Ct Pct: 0.9 % (ref 0.4–3.1)
Reticulocyte Hemoglobin: 34.5 pg (ref >27.9)

## 2022-09-11 MED ORDER — SODIUM CHLORIDE 0.9% FLUSH
10.0000 mL | Freq: Once | INTRAVENOUS | Status: AC
  Administered 2022-09-11: 10 mL via INTRAVENOUS
  Filled 2022-09-11: qty 10

## 2022-09-11 MED ORDER — HEPARIN SOD (PORK) LOCK FLUSH 100 UNIT/ML IV SOLN
500.0000 [IU] | Freq: Once | INTRAVENOUS | Status: AC
  Administered 2022-09-11: 500 [IU] via INTRAVENOUS
  Filled 2022-09-11: qty 5

## 2022-09-11 NOTE — Assessment & Plan Note (Signed)
Follow up with cardiology

## 2022-09-11 NOTE — Assessment & Plan Note (Signed)
No significant improvement after holding chemotherapy.  No significant improvement after IV Venofer treatments and B12 injections. There is likely a component of anemia secondary to chronic kidney disease. I will hold off erythropoietin replacement therapy due to its angiogenesis effects. bone marrow biopsy results reviewed with patient. 07/21/22  bone marrow is slightly hypercellular at best with trilineage hematopoiesis.  There are generally mild dyspoietic changes present particularly in the erythroid and megakaryocytic cell lines.  No increase in blastic cells identified.  The overall changes are limited and not considered specific or diagnostic of a myeloid neoplasm and may be secondary in nature in this setting.  Nonetheless, correlation with cytogenetic and FISH studies is recommended.  There is no diagnostic evidence of a lymphoproliferative process, metastatic carcinoma or plasma cell neoplasm.  MDS FISH negative.  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  continue observation.

## 2022-09-11 NOTE — Assessment & Plan Note (Signed)
Continue port flush Q6-8 weeks.  

## 2022-09-11 NOTE — Progress Notes (Signed)
Hematology/Oncology Progress note [phone] [phone]       CHIEF COMPLAINTS/PURPOSE OF VISIT Follow up for ovarian cancer.  ASSESSMENT & PLAN:   Cancer Staging  Malignant neoplasm of ovary (HCC) Staging form: Ovary, Fallopian Tube, and Primary Peritoneal Carcinoma, AJCC 8th Edition - Clinical stage from 11/22/2016: Stage IIIC (cT3c, cN1b, cM0) - Signed by Rickard Patience, MD on 11/22/2016   Malignant neoplasm of ovary (HCC) 05/11/2022 CT chest abdomen pelvis w contrast showed stable disease.   Labs are reviewed and discussed with patient. 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 CT shows no disease progression.  Folate receptor alpha positive   Recommend continue observation, and in the future, when she progresses, consider Mirvetuximab    Macrocytic anemia No significant improvement after holding chemotherapy.  No significant improvement after IV Venofer treatments and B12 injections. There is likely a component of anemia secondary to chronic kidney disease. I will hold off erythropoietin replacement therapy due to its angiogenesis effects. bone marrow biopsy results reviewed with patient. 07/21/22  bone marrow is slightly hypercellular at best with trilineage hematopoiesis.  There are generally mild dyspoietic changes present particularly in the erythroid and megakaryocytic cell lines.  No increase in blastic cells identified.  The overall changes are limited and not considered specific or diagnostic of a myeloid neoplasm and may be secondary in nature in this setting.  Nonetheless, correlation with cytogenetic and FISH studies is recommended.  There is no diagnostic evidence of a lymphoproliferative process, metastatic carcinoma or plasma cell neoplasm.  MDS FISH negative.  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  continue observation.  CHF (congestive heart failure) (HCC) Follow up with  cardiology  Port-A-Cath in place Continue port flush Q6-8 weeks    Orders Placed This Encounter  Procedures   CT CHEST ABDOMEN PELVIS WO CONTRAST    Standing Status:   Future    Standing Expiration Date:   09/11/2023    Order Specific Question:   Preferred imaging location?    Answer:   [facility]    Order Specific Question:   If indicated for the ordered procedure, I authorize the administration of oral contrast media per Radiology protocol    Answer:   Yes    Order Specific Question:   Does the patient have a contrast media/X-ray dye allergy?    Answer:   No    Order Specific Question:   Release to patient    Answer:   Immediate   CBC (Cancer Center Only)    Standing Status:   Future    Standing Expiration Date:   09/11/2023   CMP (Cancer Center only)    Standing Status:   Future    Standing Expiration Date:   09/11/2023   CA 125    Standing Status:   Future    Standing Expiration Date:   09/11/2023   Iron and TIBC    Standing Status:   Future    Standing Expiration Date:   09/11/2023   Ferritin    Standing Status:   Future    Standing Expiration Date:   09/11/2023   Retic Panel    Standing Status:   Future    Standing Expiration Date:   09/11/2023   Follow-up in 3 months.   All questions were answered. The patient knows to call the clinic with any problems, questions or concerns.  Rickard Patience, MD, PhD [facility] Hematology Oncology  09/11/2022      HISTORY OF PRESENTING ILLNESS: Katie Hill 72 y.o. female presents for follow up of management of recurrent  ovarian cancer. Oncology History  Malignant neoplasm of ovary (HCC)  09/20/2011 Imaging   CT chest abdomen pelvis w contrast 1. No significant interval changed compared with the previous exam.2. Stable appearance of soft tissue attenuating lesion in the posterolateral right hemipelvis. This may represent a treated site of peritoneal disease.3. Unchanged appearance of borderline enlarged left periaortic  and aortocaval retroperitoneal lymph nodes.4. Unchanged 6 mm left lower lobe lung nodule. 5. No new signs of metastatic disease within the chest, abdomen or pelvis. 6. Aortic Atherosclerosis (ICD10-I70.0) and Emphysema    11/20/2016 Initial Diagnosis   Malignant neoplasm of ovary   Pathology 11/16/2016 Surgical Pathology  CASE: [id number]  PATIENT: Katie Hill  Surgical Pathology Report   SPECIMEN SUBMITTED:  A. Retroperitoneal adenopathy, left  DIAGNOSIS:  A. LYMPH NODE, LEFT RETROPERITONEAL; CT-GUIDED CORE BIOPSY:  - METASTATIC HIGH-GRADE SEROUS CARCINOMA.  Pathology 03/14/2017   DIAGNOSIS:  A. OMENTUM; OMENTECTOMY:  - NO TUMOR SEEN.  - ONE NEGATIVE LYMPH NODE (0/1).   B.  RIGHT FALLOPIAN TUBE AND OVARY; SALPINGO-OOPHORECTOMY:  - SMALL FOCI OF HIGH GRADE SEROUS CARCINOMA INVOLVING THE OVARY.  - MARKED THERAPY RELATED CHANGE.  - NO TUMOR SEEN IN THE FALLOPIAN TUBE.   C.  UTERUS, CERVIX, LEFT FALLOPIAN TUBE AND OVARY; HYSTERECTOMY AND LEFT  SALPINGO-OOPHORECTOMY:  - NABOTHIAN CYSTS.  - CYSTIC ATROPHY OF THE ENDOMETRIUM.  - SEROSAL ADHESIONS.  - UNREMARKABLE FALLOPIAN TUBE.  - OVARY SHOWING TREATMENT RELATED CHANGE.   D.  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    12/01/2016 -  Chemotherapy   Carboplatin and taxol x 4 neoadjuvant chemotherapy    03/14/2017 Surgery   Patient had debulking surgery on 03/14/2017. She had a laparoscopy with conversion to laparotomy, total hysterectomy, with bilateral salpingo oophorectomy, right aortic lymph node dissection, omentectomy. Pathology showed small foci of residual disease in ovary.   HRD positive, declined Olarparib maintenance trial    03/28/2017 -  Chemotherapy   Adjuvant carbo and taxol x3 cycles   02/28/2018 Progression   Local Recurrence. CA125 146 CT abdomen pelvis w contrast showed interval enlargement of an aortocaval lymph node which  currently measures 1.4 cm in short axis (axial image 75 of series 2). This lymph node is in close proximity to the previously resected retroperitoneal lymphadenopathy and is very concerning for an additional nodal metastasis.    03/15/2018 -  Chemotherapy   03/15/2018-05/17/2018 Carboplatin and Taxol x 4.  CA125 decrease from 49.7 to 6 after 4 cycles of treatment.  05/17/2018-10/29/20   Olarparib 300mg  BID    11/05/2018 Imaging   MRI abdomenPelvis on 11/05/2018 showed Interval progression of, now measuring 2.7 x 2.3 cm and concerning for progression of metastatic disease.retrocaval lymph node in the abdomen Olaparib was held for short period of time. Her CA125 trended down again.  Dr. Johnnette Litter recommending resume olaparib and continue monitor.   10/06/2020 Progression    CT chest abdomen pelvis with contrast showed new lymph nodes in the prevascular space of the upper anterior mediastinum most consistent with metastasis.  Single new right lower lobe pulmonary nodule is concerning for early pulmonary metastasis.  Interval enlargement of the left periaortic retroperitoneal lymph node.  Stable adjacent aorto caval adenopathy.    Genetic Testing   Genetic testing negative for 83 genes on [facility]'s Multi-Cancer  panel (ALK, APC, ATM, AXIN2, BAP1, BARD1, BLM, BMPR1A, BRCA1, BRCA2, BRIP1, CASR, CDC73, CDH1, CDK4, CDKN1B, CDKN1C, CDKN2A, CEBPA, CHEK2, CTNNA1, DICER1, DIS3L2, EGFR, EPCAM, FH, FLCN, GATA2, GPC3, GREM1, HOXB13, HRAS, KIT, MAX, MEN1, MET, MITF, MLH1, MSH2, MSH3, MSH6, MUTYH, NBN, NF1, NF2, NTHL1, PALB2, PDGFRA, PHOX2B, PMS2, POLD1, POLE, POT1, PRKAR1A, PTCH1, PTEN, RAD50, RAD51C, RAD51D, RB1, RECQL4, RET, RUNX1, SDHA, SDHAF2, SDHB, SDHC, SDHD, SMAD4, SMARCA4, SMARCB1, SMARCE1, STK11, SUFU, TERC, TERT, TMEM127, TP53, TSC1, TSC2, VHL, WRN, WT1).   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  Myraid testing negative for somatic BRACA1/2, positive for HRD.    11/03/2020  - 11/18/2021 Chemotherapy   Liposomal Doxorubicin + Bevacizumab q28d   11/03/20 -05/20/21 Doxil + Bevacizumab q28d  06/03/21 Doxil only due to proteinuria.  07/01/21-07/15/21  Resumed on Doxil + Bevacizumab 07/29/21 Doxil only due to proteinuria.  11/18/21 last dose of Doxil. Stopped due to CHF   10/14/2021 Echocardiogram    Left ventricular ejection fraction, by estimation, is 55 to 60%   12/16/2021 Imaging   [place] lower extremities 1. No evidence of deep venous thrombosis in either lower extremity. 2. 1.9 cm left popliteal fossa Baker's cyst.   12/23/2021 Echocardiogram   LVEF 55%   12/26/2021 Imaging   CT chest abdomen pelvis w contrast   Chest Impression:   1. New RIGHT pleural effusion and new mild nodularity along the RIGHT oblique fissure. Recommend close attention on follow-up for potential pleural metastasis in the RIGHT hemithorax. 2. Stable LEFT lobe pulmonary nodule.3. No mediastinal lymphadenopathy.   Abdomen / Pelvis Impression:   1. Stable soft tissue lesion in the deep RIGHT pelvis. 2. Mass stable small periaortic lymph nodes. 3. No intraperitoneal free fluid the abdomen or pelvis.   01/20/2022 - 01/23/2022 Hospital Admission   Hospitalized due to acute on chronic diastolic CHF, hypertension urgency, She was seen by cardiology and nephrology. BNP was significantly elevated at 4500 She was discharged home with Losartan, Aldactone, Lasix, beta blocker and hydralazine.    02/21/2022 Imaging   CT chest abdomen pelvis w contrast Stable small soft tissue lesion in the right perirectal lesion. Stable mild retroperitoneal and retrocrural lymphadenopathy.Stable 6 mm left lower lobe pulmonary nodule.No new or progressive disease within the chest, abdomen, or pelvis   02/22/2022 Imaging   CT chest abdomen pelvis w contrast Stable small soft tissue lesion in the right perirectal lesion. Stable mild retroperitoneal and retrocrural lymphadenopathy. Stable 6 mm left lower lobe pulmonary  nodule. No new or progressive disease within the chest, abdomen, or pelvis.   05/11/2022 Imaging   CT chest abdomen pelvis without contrast showed 1. Stable examination without convincing evidence of new or progressive disease in the chest, abdomen or pelvis on this noncontrast enhanced CT. 2. Stable small soft tissue right perirectal soft tissue lesion.3. Stable 6 mm left lower lobe pulmonary nodule. 4. Stable prominent left supraclavicular and retroperitoneal lymph nodes. 5. Large volume of formed stool in the colon. Correlate for constipation. 6.  Aortic Atherosclerosis   High grade ovarian cancer [facility])    INTERVAL HISTORY CARL HABTE is a 72 y.o. female who has above history reviewed by me today presents for follow up visit for recurrent Ovarian cancer Patient reports no new problems since last visit.  She denies feeling very tired or fatigued. CHF is controlled well.   . Review of Systems  Constitutional:  Positive for fatigue. Negative for appetite change, chills and fever.  HENT:   Negative for hearing  loss and voice change.   Eyes:  Negative for eye problems.  Respiratory:  Negative for chest tightness and cough.   Cardiovascular:  Negative for chest pain and leg swelling.  Gastrointestinal:  Negative for abdominal distention, abdominal pain and blood in stool.  Endocrine: Negative for hot flashes.  Genitourinary:  Negative for difficulty urinating and frequency.   Musculoskeletal:  Negative for arthralgias.  Skin:  Negative for itching and rash.  Neurological:  Negative for extremity weakness.  Hematological:  Negative for adenopathy.  Psychiatric/Behavioral:  Negative for confusion.      [name] MEDICAL HISTORY: Past Medical History:  Diagnosis Date   (HFpEF) heart failure with preserved ejection fraction (HCC)    a. 12/2021 Echo: EF 55%, no rwma, nl RV fxn, mild MR, mild-mod TR. Triv AI.   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   Pelvic mass in female     SURGICAL HISTORY: Past Surgical History:  Procedure Laterality Date   APPENDECTOMY     IR BONE MARROW BIOPSY & ASPIRATION  07/21/2022   LAPAROSCOPY N/A 03/14/2017   Procedure: LAPAROSCOPY OPERATIVE;  Surgeon: Leida Lauth, MD;  Location: [facility] ORS;  Service: Gynecology;  Laterality: N/A;   LAPAROTOMY N/A 03/14/2017   Procedure: LAPAROTOMY;  Surgeon: Leida Lauth, MD;  Location: [facility] ORS;  Service: Gynecology;  Laterality: N/A;   LYMPH NODE DISSECTION N/A 03/14/2017   Procedure: LYMPH NODE DISSECTION;  Surgeon: Leida Lauth, MD;  Location: [facility] ORS;  Service: Gynecology;  Laterality: N/A;   OMENTECTOMY N/A 03/14/2017   Procedure: OMENTECTOMY;  Surgeon: Leida Lauth, MD;  Location: [facility] ORS;  Service: Gynecology;  Laterality: N/A;   PORTA CATH INSERTION N/A 11/27/2016   Procedure: Shelda Pal Cath Insertion;  Surgeon: Annice Needy, MD;  Location: [facility] IN[facility]IVE [facility];  Service: Cardiovascular;  Laterality: N/A;    SOCIAL HISTORY: Social History   Tobacco Use   Smoking status: Never   Smokeless tobacco: Never  Vaping Use   Vaping Use: Never used  Substance Use Topics   Alcohol use: Not Currently   Drug use: No     FAMILY HISTORY Family History  Problem Relation Age of Onset   Throat cancer Cousin    Throat cancer Cousin    Leukemia Cousin     ALLERGIES:  is allergic to omeprazole.  MEDICATIONS:  Current Outpatient Medications  Medication Sig Dispense Refill   aspirin 81 MG chewable tablet Chew 1 tablet (81 mg total) by mouth daily. 30 tablet 0   Cyanocobalamin (B-12) 1000 MCG CAPS Take 1,000 mg by mouth daily.     empagliflozin (JARDIANCE) 10 MG TABS tablet Take 1 tablet (10 mg total) by mouth daily before breakfast. 90 tablet 3   feeding  supplement (ENSURE ENLIVE / ENSURE PLUS) LIQD Take 237 mLs by mouth 3 (three) times daily between meals. 14220 mL 0   Ferrous Sulfate (IRON) 325 (65 Fe) MG TABS Take 325 mg by mouth daily.     furosemide (LASIX) 40 MG tablet Take one tablet daily (can take extra tab in afternoon if gain 3 lbs in a day or 5 lbs in a week) (Patient taking differently: Take 40 mg by mouth daily. Take one tablet daily (can take extra tab in  afternoon if gain 3 lbs in a day or 5 lbs in a week)) 90 tablet 3   hydrALAZINE (APRESOLINE) 100 MG tablet Take 1 tablet (100 mg total) by mouth 3 (three) times daily. 30 tablet 0   levothyroxine (SYNTHROID) 100 MCG tablet Take 1 tablet (100 mcg total) by mouth daily. 90 tablet 3   lidocaine-prilocaine (EMLA) cream Apply 1 application topically as needed. Apply small amount to port site at least 1 hour prior to it being accessed, cover with plastic wrap 30 g 1   spironolactone (ALDACTONE) 25 MG tablet Take 1 tablet (25 mg total) by mouth daily. 90 tablet 3   metoprolol succinate (TOPROL XL) 25 MG 24 hr tablet Take 1 tablet (25 mg total) by mouth at bedtime. (Patient not taking: Reported on 08/09/2022) 90 tablet 3   ondansetron (ZOFRAN) 4 MG tablet Take 1 tablet (4 mg total) by mouth every 8 (eight) hours as needed for nausea or vomiting. (Patient not taking: Reported on 08/09/2022) 60 tablet 3   sacubitril-valsartan (ENTRESTO) 49-51 MG Take 1 tablet by mouth 2 (two) times daily. (Patient not taking: Reported on 08/09/2022) 180 tablet 3   No current facility-administered medications for this visit.   Facility-Administered Medications Ordered in Other Visits  Medication Dose Route Frequency Provider Last Rate Last Admin   pegfilgrastim (NEULASTA ONPRO KIT) 6 MG/0.6ML injection             PHYSICAL EXAMINATION:  ECOG PERFORMANCE STATUS: 0 - Asymptomatic Vitals:   09/11/22 1402  BP: 113/69  Pulse: 76  Temp: (!) 97.1 F (36.2 C)  SpO2: 100%     Filed Weights   09/11/22 1402   Weight: 106 lb 12.8 oz (48.4 kg)      Physical Exam Constitutional:      General: She is not in acute distress.    Appearance: She is not diaphoretic.     Comments: Thin built, she walks independantly  HENT:     Head: Normocephalic.  Eyes:     General: No scleral icterus.    Pupils: Pupils are equal, round, and reactive to light.  Neck:     Vascular: No JVD.  Cardiovascular:     Rate and Rhythm: Normal rate.  Pulmonary:     Effort: Pulmonary effort is normal. No respiratory distress.     Breath sounds: Normal breath sounds.  Abdominal:     General: Bowel sounds are normal. There is no distension.     Palpations: Abdomen is soft.  Musculoskeletal:        General: Normal range of motion.     Cervical back: Normal range of motion and neck supple.     Right lower leg: No edema.     Left lower leg: No edema.  Lymphadenopathy:     Cervical: No cervical adenopathy.  Skin:    General: Skin is warm and dry.     Findings: No rash.     Comments: Right anterior medi port +   Neurological:     Mental Status: She is alert and oriented to person, place, and time. Mental status is at baseline.     Motor: No abnormal muscle tone.  Psychiatric:        Mood and Affect: Mood and affect normal.        LABORATORY DATA: I have personally reviewed the data as listed:     Latest Ref Rng & Units 09/11/2022    1:35 PM 08/09/2022    1:07 PM 07/21/2022  9:09 AM  CBC  WBC 4.0 - 10.5 K/uL 3.7  3.2  3.3   Hemoglobin 12.0 - 15.0 g/dL 8.3  8.3  9.0   Hematocrit 36.0 - 46.0 % 25.6  26.3  27.5   Platelets 150 - 400 K/uL 134  135  153       Latest Ref Rng & Units 09/11/2022    1:35 PM 07/11/2022    9:22 AM 05/15/2022   12:46 PM  CMP  Glucose 70 - 99 mg/dL 161  76  096   BUN 8 - 23 mg/dL 38  28  41   Creatinine 0.44 - 1.00 mg/dL 0.45  4.09  8.11   Sodium 135 - 145 mmol/L 131  132  131   Potassium 3.5 - 5.1 mmol/L 4.5  4.2  4.8   Chloride 98 - 111 mmol/L 105  104  106   CO2 22 - 32 mmol/L  21  23  19    Calcium 8.9 - 10.3 mg/dL 9.2  9.4  8.9   Total Protein 6.5 - 8.1 g/dL 7.6  7.1  7.1   Total Bilirubin 0.3 - 1.2 mg/dL 0.5  0.3  0.2   Alkaline Phos 38 - 126 U/L 72  64  57   AST 15 - 41 U/L 14  17  20    ALT 0 - 44 U/L 8  9  14       RADIOGRAPHIC STUDIES: I have personally reviewed the radiological images as listed and agreed with the findings in the report. CT CHEST ABDOMEN PELVIS WO CONTRAST  Result Date: 08/25/2022 CLINICAL DATA:  Recurrent ovarian carcinoma. Surveillance exam. * Tracking Code: BO * EXAM: CT CHEST, ABDOMEN AND PELVIS WITHOUT CONTRAST TECHNIQUE: Multidetector CT imaging of the chest, abdomen and pelvis was performed following the standard protocol without IV contrast. RADIATION DOSE REDUCTION: This exam was performed according to the departmental dose-optimization program which includes automated exposure control, adjustment of the mA and/or kV according to patient size and/or use of iterative reconstruction technique. COMPARISON:  CT 05/11/2022 FINDINGS: CT CHEST FINDINGS Cardiovascular: No significant vascular findings. Normal heart size. No pericardial effusion. Port in the anterior chest wall with tip in distal SVC. Mediastinum/Nodes: No axillary or supraclavicular adenopathy. No mediastinal or hilar adenopathy. No pericardial fluid. Esophagus normal. Lungs/Pleura: Rounded RIGHT lobe pulmonary nodule measures 6 mm (is 105/3) unchanged from prior. Musculoskeletal: No aggressive osseous lesion. CT ABDOMEN AND PELVIS FINDINGS Hepatobiliary: No focal hepatic lesion. No biliary ductal dilatation. Gallbladder is normal. Common bile duct is normal. Pancreas: Pancreas is normal. No ductal dilatation. No pancreatic inflammation. Spleen: Normal spleen Adrenals/urinary tract: Adrenal glands and kidneys are normal. The ureters and bladder normal. Stomach/Bowel: Stomach, small bowel, appendix, and cecum are normal. The colon and rectosigmoid colon are normal. Vascular/Lymphatic:  Abdominal aorta is normal caliber. There is no retroperitoneal or periportal lymphadenopathy. No pelvic lymphadenopathy. Reproductive: Post hysterectomy.  Adnexa unremarkable Other: No omental or peritoneal nodularity. No sidewall nodularity. No free fluid. A soft tissue nodule in the deep RIGHT pelvis adjacent the rectum measures 17 mm x 13 mm (image 111/series 2 compared to 19 mm x 11 mm on comparison exam for no interval change. Musculoskeletal: No aggressive osseous lesion. IMPRESSION: CHEST IMPRESSION: 1. No evidence of thoracic metastasis. 2. Stable RIGHT lower lobe pulmonary nodule. PELVIS IMPRESSION: 1. No evidence of ovarian cancer recurrence in the abdomen pelvis. 2. Stable soft tissue nodule RIGHT adjacent to the rectum in the deep RIGHT pelvis. 3. Post hysterectomy. Electronically  Signed   By: Genevive Bi M.D.   On: 08/25/2022 16:57   IR BONE MARROW BIOPSY & ASPIRATION  Result Date: 07/21/2022 INDICATION: MACROCYTIC ANEMIA EXAM: FLUOROSCOPY GUIDED RIGHT ILIAC BONE MARROW ASPIRATION AND CORE BIOPSY Date:  07/21/2022 07/21/2022 10:06 am Radiologist:  M. Ruel Favors, MD Guidance:  Fluoroscopy FLUOROSCOPY: Fluoroscopy Time: 1 minutes 6 seconds (2.0 mGy). MEDICATIONS: 1% lidocaine local ANESTHESIA/SEDATION: 1.0 mg IV Versed; 50 mcg IV Fentanyl Moderate Sedation Time:  10 minute The patient was continuously monitored during the procedure by the interventional radiology nurse under my direct supervision. CONTRAST:  None. COMPLICATIONS: None PROCEDURE: Informed consent was obtained from the patient following explanation of the procedure, risks, benefits and alternatives. The patient understands, agrees and consents for the procedure. All questions were addressed. A time out was performed. The patient was positioned prone and fluoroscopy was utilized to locate the right iliac marrow space. Maximal barrier sterile technique utilized including caps, mask, sterile gowns, sterile gloves, large sterile drape,  hand hygiene, and Betadine prep. Under sterile conditions and local anesthesia, an 11 gauge coaxial bone biopsy needle was advanced into the right iliac marrow space. Needle position was confirmed with fluoroscopic imaging. Initially, bone marrow aspiration was performed. Next, the 11 gauge outer cannula was utilized to obtain a right iliac bone marrow core biopsy. Needle was removed. Hemostasis was obtained with compression. The patient tolerated the procedure well. Samples were prepared with the cytotechnologist. No immediate complications. IMPRESSION: Successful fluoroscopically guided right iliac bone marrow aspiration and core biopsy. Electronically Signed   By: Judie Petit.  Shick M.D.   On: 07/21/2022 10:19

## 2022-09-11 NOTE — Assessment & Plan Note (Signed)
05/11/2022 CT chest abdomen pelvis w contrast showed stable disease.   Labs are reviewed and discussed with patient. 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 CT shows no disease progression.  Folate receptor alpha positive   Recommend continue observation, and in the future, when she progresses, consider Mirvetuximab

## 2022-09-12 LAB — CA 125: Cancer Antigen (CA) 125: 39.8 U/mL — ABNORMAL HIGH (ref 0.0–38.1)

## 2022-09-19 DIAGNOSIS — I10 Essential (primary) hypertension: Secondary | ICD-10-CM | POA: Diagnosis not present

## 2022-09-19 DIAGNOSIS — R600 Localized edema: Secondary | ICD-10-CM | POA: Diagnosis not present

## 2022-09-19 DIAGNOSIS — I129 Hypertensive chronic kidney disease with stage 1 through stage 4 chronic kidney disease, or unspecified chronic kidney disease: Secondary | ICD-10-CM | POA: Diagnosis not present

## 2022-09-19 DIAGNOSIS — N1832 Chronic kidney disease, stage 3b: Secondary | ICD-10-CM | POA: Diagnosis not present

## 2022-09-19 DIAGNOSIS — N2581 Secondary hyperparathyroidism of renal origin: Secondary | ICD-10-CM | POA: Diagnosis not present

## 2022-10-03 ENCOUNTER — Encounter: Payer: Self-pay | Admitting: Family

## 2022-10-03 ENCOUNTER — Ambulatory Visit: Payer: Medicare Other | Attending: Family | Admitting: Family

## 2022-10-03 VITALS — BP 121/74 | HR 72 | Wt 107.0 lb

## 2022-10-03 DIAGNOSIS — I10 Essential (primary) hypertension: Secondary | ICD-10-CM | POA: Diagnosis not present

## 2022-10-03 DIAGNOSIS — Z8543 Personal history of malignant neoplasm of ovary: Secondary | ICD-10-CM | POA: Diagnosis not present

## 2022-10-03 DIAGNOSIS — I5032 Chronic diastolic (congestive) heart failure: Secondary | ICD-10-CM | POA: Diagnosis not present

## 2022-10-03 DIAGNOSIS — D649 Anemia, unspecified: Secondary | ICD-10-CM | POA: Diagnosis not present

## 2022-10-03 DIAGNOSIS — C569 Malignant neoplasm of unspecified ovary: Secondary | ICD-10-CM

## 2022-10-03 DIAGNOSIS — I130 Hypertensive heart and chronic kidney disease with heart failure and stage 1 through stage 4 chronic kidney disease, or unspecified chronic kidney disease: Secondary | ICD-10-CM | POA: Insufficient documentation

## 2022-10-03 DIAGNOSIS — I428 Other cardiomyopathies: Secondary | ICD-10-CM | POA: Diagnosis not present

## 2022-10-03 DIAGNOSIS — D6481 Anemia due to antineoplastic chemotherapy: Secondary | ICD-10-CM | POA: Diagnosis not present

## 2022-10-03 DIAGNOSIS — Z79899 Other long term (current) drug therapy: Secondary | ICD-10-CM | POA: Insufficient documentation

## 2022-10-03 DIAGNOSIS — N189 Chronic kidney disease, unspecified: Secondary | ICD-10-CM | POA: Diagnosis not present

## 2022-10-03 NOTE — Patient Instructions (Signed)
Call us in the future if you need [place] for anything.   If you receive a satisfaction survey regarding the [facility], please take the time to fill it out. This way we can continue to provide excellent care and make any changes that need to be made.

## 2022-10-03 NOTE — Progress Notes (Signed)
[facility] HEART FAILURE CLINIC - Pharmacist Note  Katie Hill is a 72 y.o. female with HFpEF (EF >50%) presenting to the Heart Failure Clinic for follow up. Patient reports doing well on current regimen. She reports no signs or symptoms of volume overload. She reports adherence to 64 oz fluid restriction and that her weight is stable.  Recent ED Visit (past 6 months): none  Guideline-Directed Medical Therapy/Evidence Based Medicine ACE/ARB/ARNI: Sacubitril/valsartan 49/51 mg twice daily Beta Blocker:  none Aldosterone Antagonist: Spironolactone 25 mg every MWF Diuretic: Furosemide 40 mg daily SGLT2i: Empagliflozin 10 mg daily  Adherence Assessment Do you ever forget to take your medication? [] Yes [x] No  Do you ever skip doses due to side effects? [] Yes [x] No  Do you have trouble affording your medicines? [] Yes [x] No  Are you ever unable to pick up your medication due to transportation difficulties? [] Yes [x] No  Do you ever stop taking your medications because you don't believe they are helping? [] Yes [x] No  Do you check your weight daily? [x] Yes [] No  Adherence strategy: pill box Barriers to obtaining medications: none reported  Diagnostics ECHO: Date 12/23/2021, EF 55%, no RWMA  Vitals    10/03/2022   10:45 AM 09/11/2022    2:02 PM 08/09/2022    1:23 PM  Vitals with BMI  Weight 107 lbs 106 lbs 13 oz 106 lbs 14 oz  BMI   16.74  Systolic 121 113 970  Diastolic 74 69 78  Pulse 72 76 84     Recent Labs    Latest Ref Rng & Units 09/11/2022    1:35 PM 07/11/2022    9:22 AM 05/15/2022   12:46 PM  BMP  Glucose 70 - 99 mg/dL 263  76  785   BUN 8 - 23 mg/dL 38  28  41   Creatinine 0.44 - 1.00 mg/dL 8.85  0.27  7.41   Sodium 135 - 145 mmol/L 131  132  131   Potassium 3.5 - 5.1 mmol/L 4.5  4.2  4.8   Chloride 98 - 111 mmol/L 105  104  106   CO2 22 - 32 mmol/L 21  23  19    Calcium 8.9 - 10.3 mg/dL 9.2  9.4  8.9     Past Medical History Past Medical History:  Diagnosis Date    (HFpEF) heart failure with preserved ejection fraction (HCC)    a. 12/2021 Echo: EF 55%, no rwma, nl RV fxn, mild MR, mild-mod TR. Triv AI.   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

## 2022-10-03 NOTE — Progress Notes (Signed)
PCP: Marcelino Duster, MD (last seen 04/24) Primary Cardiologist: Debbe Odea, MD (last seen 03/24)  HPI:  Katie Hill is a 72 y/o female with a history of ovarian cancer, HTN, CKD, anemia and chronic heart failure.   Echo 12/23/21: EF of 55% along with mild MR and mild/moderate  Admitted 01/20/22 due to acute diastolic congestive heart failure, bilateral effusions and acute hypoxic respiratory failure. Treated with IV lasix with transition to oral diuretics. Cardiology and nephrology consults obtained. Antihypertensive meds added. Weaned off of oxygen. Carvedilol held for transient bradycardia but then resumed by cardiology. Hypokalemia corrected. Renal ultrasound negative. Discharged after 3 days.   She presents today for a follow-up visit with a chief complaint of moderate fatigue with minimal exertion. Chronic in nature. Denies SOB, cough, chest pain, palpitations, pedal edema, dizziness, difficulty sleeping or weight gain. Overall feels quite well today.   Nephrology has resumed spironolactone 25mg  on M, W, F as pharmacy told patient that the medication had been cancelled. Is no longer taking metoprolol.  ROS: All systems negative except as listed in HPI, PMH and Problem List.  SH:  Social History   Socioeconomic History   Marital status: Married    Spouse name: Not on file   Number of children: Not on file   Years of education: Not on file   Highest education level: Not on file  Occupational History   Not on file  Tobacco Use   Smoking status: Never   Smokeless tobacco: Never  Vaping Use   Vaping Use: Never used  Substance and Sexual Activity   Alcohol use: Not Currently   Drug use: No   Sexual activity: Yes    Birth control/protection: Post-menopausal  Other Topics Concern   Not on file  Social History Narrative   Lives locally with husband.  Was fairly active.   Social Determinants of Health   Financial Resource Strain: Low Risk  (04/21/2021)   Overall Financial  Resource Strain (CARDIA)    Difficulty of Paying Living Expenses: Not hard at all  Food Insecurity: No Food Insecurity (01/22/2022)   Hunger Vital Sign    Worried About Running Out of Food in the Last Year: Never true    Ran Out of Food in the Last Year: Never true  Transportation Needs: No Transportation Needs (01/22/2022)   PRAPARE - Administrator, Civil Service (Medical): No    Lack of Transportation (Non-Medical): No  Physical Activity: Insufficiently Active (04/21/2021)   Exercise Vital Sign    Days of Exercise per Week: 6 days    Minutes of Exercise per Session: 20 min  Stress: No Stress Concern Present (04/21/2021)   [employer] of Occupational Health - Occupational Stress Questionnaire    Feeling of Stress : Not at all  Social Connections: Moderately Isolated (04/21/2021)   Social Connection and Isolation Panel [NHANES]    Frequency of Communication with Friends and Family: Three times a week    Frequency of Social Gatherings with Friends and Family: Twice a week    Attends Religious Services: Never    Database administrator or Organizations: No    Attends Banker Meetings: Never    Marital Status: Married  Catering manager Violence: Not At Risk (01/22/2022)   Humiliation, Afraid, Rape, and Kick questionnaire    Fear of Current or Ex-Partner: No    Emotionally Abused: No    Physically Abused: No    Sexually Abused: No    FH:  Family  History  Problem Relation Age of Onset   Throat cancer Cousin    Throat cancer Cousin    Leukemia Cousin     Past Medical History:  Diagnosis Date   (HFpEF) heart failure with preserved ejection fraction (HCC)    a. 12/2021 Echo: EF 55%, no rwma, nl RV fxn, mild MR, mild-mod TR. Triv AI.   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 30 tablet 0   Cyanocobalamin (B-12) 1000 MCG CAPS Take 1,000 mg by mouth daily.     empagliflozin (JARDIANCE) 10 MG TABS tablet Take 1 tablet (10 mg total) by mouth daily before breakfast. 90 tablet 3   feeding supplement (ENSURE ENLIVE / ENSURE PLUS) LIQD Take 237 mLs by mouth 3 (three) times daily between meals. 14220 mL 0   Ferrous Sulfate (IRON) 325 (65 Fe) MG TABS Take 325 mg by mouth daily.     furosemide (LASIX) 40 MG tablet Take one tablet daily (can take extra tab in afternoon if gain 3 lbs in a day or 5 lbs in a week) (Patient taking differently: Take 40 mg by mouth daily. Take one tablet daily (can take extra tab in afternoon if gain 3 lbs in a day or 5 lbs in a week)) 90 tablet 3   hydrALAZINE (APRESOLINE) 100 MG tablet Take 1 tablet (100 mg total) by mouth 3 (three) times daily. 30 tablet 0   levothyroxine (SYNTHROID) 100 MCG tablet Take 1 tablet (100 mcg total) by mouth daily. 90 tablet 3   lidocaine-prilocaine (EMLA) cream Apply 1 application topically as needed. Apply small amount to port site at least 1 hour prior to it being accessed, cover with plastic wrap 30 g 1   metoprolol succinate (TOPROL XL) 25 MG 24 hr tablet Take 1 tablet (25 mg total) by mouth at bedtime. (Patient not taking: Reported on 08/09/2022) 90 tablet 3   ondansetron (ZOFRAN) 4 MG tablet Take 1 tablet (4 mg total) by mouth every 8 (eight) hours as needed for nausea or vomiting. (Patient not taking: Reported on 08/09/2022) 60 tablet 3   sacubitril-valsartan (ENTRESTO) 49-51 MG Take 1 tablet by mouth 2 (two) times daily. (Patient not taking: Reported on 08/09/2022) 180 tablet 3   spironolactone (ALDACTONE) 25 MG tablet Take 1 tablet (25 mg total) by mouth daily. 90 tablet 3   No current  facility-administered medications for this visit.   Facility-Administered Medications Ordered in Other Visits  Medication Dose Route Frequency Provider Last Rate Last Admin   pegfilgrastim (NEULASTA ONPRO KIT) 6 MG/0.6ML injection            Vitals:   10/03/22 1045  BP: 121/74  Pulse: 72  SpO2: 100%  Weight: 107 lb (48.5 kg)   Wt Readings from Last 3 Encounters:  10/03/22 107 lb (48.5 kg)  09/11/22 106 lb 12.8 oz (48.4 kg)  08/09/22 106 lb 14.4 oz (48.5 kg)   Lab Results  Component Value Date   CREATININE 1.51 (H) 09/11/2022   CREATININE 1.26 (H) 07/11/2022   CREATININE 1.40 (  H) 05/15/2022   PHYSICAL EXAM:  General:  Well appearing. No resp difficulty HEENT: normal Neck: supple. JVP flat. No lymphadenopathy or thryomegaly appreciated. Cor: PMI normal. Regular rate & rhythm. No rubs, gallops or murmurs. Lungs: clear Abdomen: soft, nontender, nondistended. No hepatosplenomegaly. No bruits or masses. Good bowel sounds. Extremities: no cyanosis, clubbing, rash, edema Neuro: alert & oriented x3, cranial nerves grossly intact. Moves all 4 extremities w/o difficulty. Affect pleasant.   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 Normal LV wall motion. - adding salt but is trying to use less; reviewed the importance of not adding any salt and to try using Mrs Sharilyn Sites seasoning instead of salt - drinking 2 cups of coffee and 3 cups water daily; reviewed importance of keeping daily fluid intake to 60-64 ounces - saw cardiology  (Agbor-Etang) 03/24 - continue jardiance 10mg  daily - continue entresto 49/51mg  BID - continue spironolactone 25mg  3x/ week - continue furosemide 40mg  daily with additional 40mg  PRN; has not used the PRN dose - BNP 02/03/22 was 2073.3 - PharmD reconciled meds w/ patient and husband  2: HTN with CKD- - BP 121/74 - saw PCP Letitia Libra) 04/24 - BMP 09/11/22 reviewed and showed sodium 131, potassium 4.5, creatinine 1.51 & GFR 37 - saw nephrology Thedore Mins) 06/24  3: Ovarian cancer- - saw oncology Cathie Hoops) 06/24 - last treatment 11/2021  4: Anemia - hemoglobin 09/11/22 was 8.3 - venofer infusion done 06/13/22 - bone marrow biopsy done 07/21/22  Due to HF stability, will not make a return appointment at this time. Advised patient to follow closely with her other providers but that she could always call back for questions or to make another appointment and she was comfortable with this plan.

## 2022-10-04 DIAGNOSIS — N1831 Chronic kidney disease, stage 3a: Secondary | ICD-10-CM | POA: Diagnosis not present

## 2022-10-04 DIAGNOSIS — R809 Proteinuria, unspecified: Secondary | ICD-10-CM | POA: Diagnosis not present

## 2022-10-04 DIAGNOSIS — R600 Localized edema: Secondary | ICD-10-CM | POA: Diagnosis not present

## 2022-10-04 DIAGNOSIS — I10 Essential (primary) hypertension: Secondary | ICD-10-CM | POA: Diagnosis not present

## 2022-10-04 DIAGNOSIS — I129 Hypertensive chronic kidney disease with stage 1 through stage 4 chronic kidney disease, or unspecified chronic kidney disease: Secondary | ICD-10-CM | POA: Diagnosis not present

## 2022-10-30 ENCOUNTER — Inpatient Hospital Stay: Payer: Medicare Other | Attending: Oncology

## 2022-10-30 DIAGNOSIS — C561 Malignant neoplasm of right ovary: Secondary | ICD-10-CM | POA: Diagnosis not present

## 2022-10-30 DIAGNOSIS — Z95828 Presence of other vascular implants and grafts: Secondary | ICD-10-CM

## 2022-10-30 DIAGNOSIS — Z452 Encounter for adjustment and management of vascular access device: Secondary | ICD-10-CM | POA: Insufficient documentation

## 2022-10-30 MED ORDER — HEPARIN SOD (PORK) LOCK FLUSH 100 UNIT/ML IV SOLN
500.0000 [IU] | Freq: Once | INTRAVENOUS | Status: AC
  Administered 2022-10-30: 500 [IU] via INTRAVENOUS
  Filled 2022-10-30: qty 5

## 2022-10-30 MED ORDER — SODIUM CHLORIDE 0.9% FLUSH
10.0000 mL | Freq: Once | INTRAVENOUS | Status: AC
  Administered 2022-10-30: 10 mL via INTRAVENOUS
  Filled 2022-10-30: qty 10

## 2022-12-12 ENCOUNTER — Ambulatory Visit
Admission: RE | Admit: 2022-12-12 | Discharge: 2022-12-12 | Disposition: A | Discharge status: Home / Self Care | Payer: Medicare Other | Source: Ambulatory Visit | Attending: Oncology | Admitting: Oncology

## 2022-12-12 DIAGNOSIS — C569 Malignant neoplasm of unspecified ovary: Secondary | ICD-10-CM | POA: Insufficient documentation

## 2022-12-12 DIAGNOSIS — R911 Solitary pulmonary nodule: Secondary | ICD-10-CM | POA: Diagnosis not present

## 2022-12-12 DIAGNOSIS — R1900 Intra-abdominal and pelvic swelling, mass and lump, unspecified site: Secondary | ICD-10-CM | POA: Diagnosis not present

## 2022-12-12 DIAGNOSIS — I700 Atherosclerosis of aorta: Secondary | ICD-10-CM | POA: Diagnosis not present

## 2022-12-12 DIAGNOSIS — Z90710 Acquired absence of both cervix and uterus: Secondary | ICD-10-CM | POA: Diagnosis not present

## 2022-12-12 DIAGNOSIS — Z8543 Personal history of malignant neoplasm of ovary: Secondary | ICD-10-CM | POA: Diagnosis not present

## 2022-12-12 MED ORDER — IOHEXOL 300 MG/ML  SOLN: 100.0000 mL | Freq: Once | INTRAMUSCULAR | Status: DC | PRN

## 2022-12-25 ENCOUNTER — Inpatient Hospital Stay: Payer: Medicare Other

## 2022-12-25 ENCOUNTER — Encounter: Payer: Self-pay | Admitting: Oncology

## 2022-12-25 ENCOUNTER — Inpatient Hospital Stay: Payer: Medicare Other | Attending: Oncology

## 2022-12-25 ENCOUNTER — Inpatient Hospital Stay (HOSPITAL_BASED_OUTPATIENT_CLINIC_OR_DEPARTMENT_OTHER): Payer: Medicare Other | Admitting: Oncology

## 2022-12-25 VITALS — BP 122/64 | HR 75 | Temp 97.6°F | Resp 18 | Wt 107.2 lb

## 2022-12-25 DIAGNOSIS — Z95828 Presence of other vascular implants and grafts: Secondary | ICD-10-CM | POA: Diagnosis not present

## 2022-12-25 DIAGNOSIS — N189 Chronic kidney disease, unspecified: Secondary | ICD-10-CM | POA: Diagnosis not present

## 2022-12-25 DIAGNOSIS — D631 Anemia in chronic kidney disease: Secondary | ICD-10-CM | POA: Insufficient documentation

## 2022-12-25 DIAGNOSIS — R1900 Intra-abdominal and pelvic swelling, mass and lump, unspecified site: Secondary | ICD-10-CM

## 2022-12-25 DIAGNOSIS — I130 Hypertensive heart and chronic kidney disease with heart failure and stage 1 through stage 4 chronic kidney disease, or unspecified chronic kidney disease: Secondary | ICD-10-CM | POA: Diagnosis not present

## 2022-12-25 DIAGNOSIS — I5031 Acute diastolic (congestive) heart failure: Secondary | ICD-10-CM | POA: Diagnosis not present

## 2022-12-25 DIAGNOSIS — D539 Nutritional anemia, unspecified: Secondary | ICD-10-CM | POA: Diagnosis not present

## 2022-12-25 DIAGNOSIS — C569 Malignant neoplasm of unspecified ovary: Secondary | ICD-10-CM

## 2022-12-25 DIAGNOSIS — C561 Malignant neoplasm of right ovary: Secondary | ICD-10-CM | POA: Insufficient documentation

## 2022-12-25 DIAGNOSIS — Z452 Encounter for adjustment and management of vascular access device: Secondary | ICD-10-CM | POA: Insufficient documentation

## 2022-12-25 LAB — IRON AND TIBC
Iron: 84 ug/dL (ref 28–170)
Saturation Ratios: 29 % (ref 10.4–31.8)
TIBC: 286 ug/dL (ref 250–450)
UIBC: 202 ug/dL

## 2022-12-25 LAB — CBC (CANCER CENTER ONLY)
HCT: 24.1 % — ABNORMAL LOW (ref 36.0–46.0)
Hemoglobin: 7.7 g/dL — ABNORMAL LOW (ref 12.0–15.0)
MCH: 32.5 pg (ref 26.0–34.0)
MCHC: 32 g/dL (ref 30.0–36.0)
MCV: 101.7 fL — ABNORMAL HIGH (ref 80.0–100.0)
Platelet Count: 128 10*3/uL — ABNORMAL LOW (ref 150–400)
RBC: 2.37 MIL/uL — ABNORMAL LOW (ref 3.87–5.11)
RDW: 12.4 % (ref 11.5–15.5)
WBC Count: 3.1 10*3/uL — ABNORMAL LOW (ref 4.0–10.5)
nRBC: 0 % (ref 0.0–0.2)

## 2022-12-25 LAB — CMP (CANCER CENTER ONLY)
ALT: 9 U/L (ref 0–44)
AST: 15 U/L (ref 15–41)
Albumin: 4.2 g/dL (ref 3.5–5.0)
Alkaline Phosphatase: 65 U/L (ref 38–126)
Anion gap: 7 (ref 5–15)
BUN: 35 mg/dL — ABNORMAL HIGH (ref 8–23)
CO2: 19 mmol/L — ABNORMAL LOW (ref 22–32)
Calcium: 9.6 mg/dL (ref 8.9–10.3)
Chloride: 105 mmol/L (ref 98–111)
Creatinine: 1.47 mg/dL — ABNORMAL HIGH (ref 0.44–1.00)
GFR, Estimated: 38 mL/min — ABNORMAL LOW (ref >60)
Glucose, Bld: 94 mg/dL (ref 70–99)
Potassium: 4.4 mmol/L (ref 3.5–5.1)
Sodium: 131 mmol/L — ABNORMAL LOW (ref 135–145)
Total Bilirubin: 0.4 mg/dL (ref 0.3–1.2)
Total Protein: 7.3 g/dL (ref 6.5–8.1)

## 2022-12-25 LAB — RETIC PANEL
Immature Retic Fract: 2 % — ABNORMAL LOW (ref 2.3–15.9)
RBC.: 2.38 MIL/uL — ABNORMAL LOW (ref 3.87–5.11)
Retic Count, Absolute: 27.8 10*3/uL (ref 19.0–186.0)
Retic Ct Pct: 1.2 % (ref 0.4–3.1)
Reticulocyte Hemoglobin: 34.7 pg (ref >27.9)

## 2022-12-25 LAB — FERRITIN: Ferritin: 314 ng/mL — ABNORMAL HIGH (ref 11–307)

## 2022-12-25 MED ORDER — HEPARIN SOD (PORK) LOCK FLUSH 100 UNIT/ML IV SOLN
500.0000 [IU] | Freq: Once | INTRAVENOUS | Status: AC
  Administered 2022-12-25: 500 [IU] via INTRAVENOUS
  Filled 2022-12-25: qty 5

## 2022-12-25 NOTE — Assessment & Plan Note (Addendum)
No significant improvement after holding chemotherapy.  No significant improvement after IV Venofer treatments and B12 injections. 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  continue observation. I will hold off erythropoietin replacement therapy due to its angiogenesis effects and ovarian cancer history.  She can get blood transfusion if symptomatic.  continue observation and close monitor  Repeat cbc in 6 weeks

## 2022-12-25 NOTE — Addendum Note (Signed)
Addended by: Coralee Rud on: 12/25/2022 10:00 AM   Modules accepted: Orders

## 2022-12-25 NOTE — Assessment & Plan Note (Signed)
Continue port flush Q6-8 weeks.

## 2022-12-25 NOTE — Assessment & Plan Note (Signed)
05/11/2022 CT chest abdomen pelvis w contrast showed stable disease.   Labs are reviewed and discussed with patient. 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 CT shows no disease progression.  Folate receptor alpha positive   Recommend continue observation, and in the future, when she progresses, consider Mirvetuximab

## 2022-12-25 NOTE — Progress Notes (Signed)
Hematology/Oncology Progress note [phone] [phone]       CHIEF COMPLAINTS/PURPOSE OF VISIT Follow up for ovarian cancer.  ASSESSMENT & PLAN:   Cancer Staging  Malignant neoplasm of ovary (HCC) Staging form: Ovary, Fallopian Tube, and Primary Peritoneal Carcinoma, AJCC 8th Edition - Clinical stage from 11/22/2016: Stage IIIC (cT3c, cN1b, cM0) - Signed by Rickard Patience, MD on 11/22/2016   Malignant neoplasm of ovary (HCC) 05/11/2022 CT chest abdomen pelvis w contrast showed stable disease.   Labs are reviewed and discussed with patient. 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 CT shows no disease progression.  Folate receptor alpha positive   Recommend continue observation, and in the future, when she progresses, consider Mirvetuximab    Macrocytic anemia No significant improvement after holding chemotherapy.  No significant improvement after IV Venofer treatments and B12 injections. 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  continue observation. I will hold off erythropoietin replacement therapy due to its angiogenesis effects.  continue observation.  CHF (congestive heart failure) (HCC) Follow up with cardiology  Port-A-Cath in place Continue port flush Q6-8 weeks    Orders Placed This Encounter  Procedures   CBC with Differential (Cancer Center Only)    Standing Status:   Future    Standing Expiration Date:   12/25/2023   CMP (Cancer Center only)    Standing Status:   Future    Standing Expiration Date:   12/25/2023   CA 125    Standing Status:   Future    Standing Expiration Date:   12/25/2023   Iron and TIBC    Standing Status:   Future    Standing Expiration Date:   12/25/2023   Ferritin    Standing Status:   Future    Standing Expiration Date:   12/25/2023   Retic Panel    Standing Status:   Future    Standing Expiration Date:   12/25/2023   CBC with Differential (Cancer Center Only)    Standing  Status:   Future    Standing Expiration Date:   12/25/2023   Sample to Blood Bank    Standing Status:   Future    Standing Expiration Date:   12/25/2023   Follow-up in 3 months.   All questions were answered. The patient knows to call the clinic with any problems, questions or concerns.  Rickard Patience, MD, PhD [facility] Hematology Oncology 12/25/2022      HISTORY OF PRESENTING ILLNESS: Katie Hill 72 y.o. female presents for follow up of management of recurrent  ovarian cancer. Oncology History  Malignant neoplasm of ovary (HCC)  09/20/2011 Imaging   CT chest abdomen pelvis w contrast 1. No significant interval changed compared with the previous exam.2. Stable appearance of soft tissue attenuating lesion in the posterolateral right hemipelvis. This may represent a treated site of peritoneal disease.3. Unchanged appearance of borderline enlarged left periaortic and aortocaval retroperitoneal lymph nodes.4. Unchanged 6 mm left lower lobe lung nodule. 5. No new signs of metastatic disease within the chest, abdomen or pelvis. 6. Aortic Atherosclerosis (ICD10-I70.0) and Emphysema    11/20/2016 Initial Diagnosis   Malignant neoplasm of ovary   Pathology 11/16/2016 Surgical Pathology  CASE: [id number]  PATIENT: Katie Hill  Surgical Pathology Report   SPECIMEN SUBMITTED:  A. Retroperitoneal adenopathy, left  DIAGNOSIS:  A. LYMPH NODE, LEFT RETROPERITONEAL; CT-GUIDED CORE BIOPSY:  - METASTATIC HIGH-GRADE SEROUS CARCINOMA.  Pathology 03/14/2017   DIAGNOSIS:  A. OMENTUM; OMENTECTOMY:  -  NO TUMOR SEEN.  - ONE NEGATIVE LYMPH NODE (0/1).   B.  RIGHT FALLOPIAN TUBE AND OVARY; SALPINGO-OOPHORECTOMY:  - SMALL FOCI OF HIGH GRADE SEROUS CARCINOMA INVOLVING THE OVARY.  - MARKED THERAPY RELATED CHANGE.  - NO TUMOR SEEN IN THE FALLOPIAN TUBE.   C.  UTERUS, CERVIX, LEFT FALLOPIAN TUBE AND OVARY; HYSTERECTOMY AND LEFT  SALPINGO-OOPHORECTOMY:  - NABOTHIAN CYSTS.  - CYSTIC ATROPHY  OF THE ENDOMETRIUM.  - SEROSAL ADHESIONS.  - UNREMARKABLE FALLOPIAN TUBE.  - OVARY SHOWING TREATMENT RELATED CHANGE.   D.  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    12/01/2016 -  Chemotherapy   Carboplatin and taxol x 4 neoadjuvant chemotherapy    03/14/2017 Surgery   Patient had debulking surgery on 03/14/2017. She had a laparoscopy with conversion to laparotomy, total hysterectomy, with bilateral salpingo oophorectomy, right aortic lymph node dissection, omentectomy. Pathology showed small foci of residual disease in ovary.   HRD positive, declined Olarparib maintenance trial    03/28/2017 -  Chemotherapy   Adjuvant carbo and taxol x3 cycles   02/28/2018 Progression   Local Recurrence. CA125 146 CT abdomen pelvis w contrast showed interval enlargement of an aortocaval lymph node which currently measures 1.4 cm in short axis (axial image 75 of series 2). This lymph node is in close proximity to the previously resected retroperitoneal lymphadenopathy and is very concerning for an additional nodal metastasis.    03/15/2018 -  Chemotherapy   03/15/2018-05/17/2018 Carboplatin and Taxol x 4.  CA125 decrease from 49.7 to 6 after 4 cycles of treatment.  05/17/2018-10/29/20   Olarparib 300mg  BID    11/05/2018 Imaging   MRI abdomenPelvis on 11/05/2018 showed Interval progression of, now measuring 2.7 x 2.3 cm and concerning for progression of metastatic disease.retrocaval lymph node in the abdomen Olaparib was held for short period of time. Her CA125 trended down again.  Dr. Johnnette Litter recommending resume olaparib and continue monitor.   10/06/2020 Progression    CT chest abdomen pelvis with contrast showed new lymph nodes in the prevascular space of the upper anterior mediastinum most consistent with metastasis.  Single new right lower lobe pulmonary nodule is concerning for early pulmonary metastasis.  Interval enlargement  of the left periaortic retroperitoneal lymph node.  Stable adjacent aorto caval adenopathy.    Genetic Testing   Genetic testing negative for 83 genes on [facility]'s Multi-Cancer panel (ALK, APC, ATM, AXIN2, BAP1, BARD1, BLM, BMPR1A, BRCA1, BRCA2, BRIP1, CASR, CDC73, CDH1, CDK4, CDKN1B, CDKN1C, CDKN2A, CEBPA, CHEK2, CTNNA1, DICER1, DIS3L2, EGFR, EPCAM, FH, FLCN, GATA2, GPC3, GREM1, HOXB13, HRAS, KIT, MAX, MEN1, MET, MITF, MLH1, MSH2, MSH3, MSH6, MUTYH, NBN, NF1, NF2, NTHL1, PALB2, PDGFRA, PHOX2B, PMS2, POLD1, POLE, POT1, PRKAR1A, PTCH1, PTEN, RAD50, RAD51C, RAD51D, RB1, RECQL4, RET, RUNX1, SDHA, SDHAF2, SDHB, SDHC, SDHD, SMAD4, SMARCA4, SMARCB1, SMARCE1, STK11, SUFU, TERC, TERT, TMEM127, TP53, TSC1, TSC2, VHL, WRN, WT1).   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  Myraid testing negative for somatic BRACA1/2, positive for HRD.    11/03/2020 - 11/18/2021 Chemotherapy   Liposomal Doxorubicin + Bevacizumab q28d   11/03/20 -05/20/21 Doxil + Bevacizumab q28d  06/03/21 Doxil only due to proteinuria.  07/01/21-07/15/21  Resumed on Doxil + Bevacizumab 07/29/21 Doxil only due to proteinuria.  11/18/21 last dose of Doxil. Stopped due to CHF   10/14/2021 Echocardiogram    Left ventricular ejection fraction, by estimation, is 55 to 60%   12/16/2021 Imaging   [place]  lower extremities 1. No evidence of deep venous thrombosis in either lower extremity. 2. 1.9 cm left popliteal fossa Baker's cyst.   12/23/2021 Echocardiogram   LVEF 55%   12/26/2021 Imaging   CT chest abdomen pelvis w contrast   Chest Impression:   1. New RIGHT pleural effusion and new mild nodularity along the RIGHT oblique fissure. Recommend close attention on follow-up for potential pleural metastasis in the RIGHT hemithorax. 2. Stable LEFT lobe pulmonary nodule.3. No mediastinal lymphadenopathy.   Abdomen / Pelvis Impression:   1. Stable soft tissue lesion in the deep RIGHT pelvis. 2. Mass stable small  periaortic lymph nodes. 3. No intraperitoneal free fluid the abdomen or pelvis.   01/20/2022 - 01/23/2022 Hospital Admission   Hospitalized due to acute on chronic diastolic CHF, hypertension urgency, She was seen by cardiology and nephrology. BNP was significantly elevated at 4500 She was discharged home with Losartan, Aldactone, Lasix, beta blocker and hydralazine.    02/21/2022 Imaging   CT chest abdomen pelvis w contrast Stable small soft tissue lesion in the right perirectal lesion. Stable mild retroperitoneal and retrocrural lymphadenopathy.Stable 6 mm left lower lobe pulmonary nodule.No new or progressive disease within the chest, abdomen, or pelvis   02/22/2022 Imaging   CT chest abdomen pelvis w contrast Stable small soft tissue lesion in the right perirectal lesion. Stable mild retroperitoneal and retrocrural lymphadenopathy. Stable 6 mm left lower lobe pulmonary nodule. No new or progressive disease within the chest, abdomen, or pelvis.   05/11/2022 Imaging   CT chest abdomen pelvis without contrast showed 1. Stable examination without convincing evidence of new or progressive disease in the chest, abdomen or pelvis on this noncontrast enhanced CT. 2. Stable small soft tissue right perirectal soft tissue lesion.3. Stable 6 mm left lower lobe pulmonary nodule. 4. Stable prominent left supraclavicular and retroperitoneal lymph nodes. 5. Large volume of formed stool in the colon. Correlate for constipation. 6.  Aortic Atherosclerosis   07/21/2022 Bone Marrow Biopsy   bone marrow is slightly hypercellular at best with trilineage hematopoiesis.  There are generally mild dyspoietic changes present particularly in the erythroid and megakaryocytic cell lines.  No increase in blastic cells identified.  The overall changes are limited and not considered specific or diagnostic of a myeloid neoplasm and may be secondary in nature in this setting.  Nonetheless, correlation with cytogenetic  and FISH studies is recommended.  There is no diagnostic evidence of a lymphoproliferative process, metastatic carcinoma or plasma cell neoplasm.  MDS FISH negative.  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    12/12/2022 Imaging   CT chest abdomen pelvis w contrast showed 1. Similar size of the soft tissue nodule adjacent to the rectum in the posterior pelvis measures similar prior at 20 x 12 mm previously 17 x 13 mm and most recent remote imaging 19 x 11 mm. 2. Stable solid 6 mm left lower lobe pulmonary nodule. 3. No new or progressive findings in the chest, abdomen or pelvis. 4.  Aortic Atherosclerosis (ICD10-I70.0)   High grade ovarian cancer [facility])    INTERVAL HISTORY Katie Hill is a 72 y.o. female who has above history reviewed by me today presents for follow up visit for recurrent Ovarian cancer Patient reports no new problems since last visit.  She denies feeling very tired or fatigued. CHF is controlled well.  Denies weight loss, fever, chills, fatigue, night sweats.   [name]  Review of Systems  Constitutional:  Positive for fatigue. Negative for appetite change, chills and fever.  HENT:   Negative for hearing loss and voice change.   Eyes:  Negative for eye problems.  Respiratory:  Negative for chest tightness and cough.   Cardiovascular:  Negative for chest pain and leg swelling.  Gastrointestinal:  Negative for abdominal distention, abdominal pain and blood in stool.  Endocrine: Negative for hot flashes.  Genitourinary:  Negative for difficulty urinating and frequency.   Musculoskeletal:  Negative for arthralgias.  Skin:  Negative for itching and rash.  Neurological:  Negative for extremity weakness.  Hematological:  Negative for adenopathy.  Psychiatric/Behavioral:  Negative for confusion.      [name] MEDICAL HISTORY: Past Medical History:  Diagnosis Date   (HFpEF) heart  failure with preserved ejection fraction (HCC)    a. 12/2021 Echo: EF 55%, no rwma, nl RV fxn, mild MR, mild-mod TR. Triv AI.   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   Pelvic mass in female     SURGICAL HISTORY: Past Surgical History:  Procedure Laterality Date   APPENDECTOMY     IR BONE MARROW BIOPSY & ASPIRATION  07/21/2022   LAPAROSCOPY N/A 03/14/2017   Procedure: LAPAROSCOPY OPERATIVE;  Surgeon: Leida Lauth, MD;  Location: [facility] OR[facility];  Service: Gynecology;  Laterality: N/A;   LAPAROTOMY N/A 03/14/2017   Procedure: LAPAROTOMY;  Surgeon: Leida Lauth, MD;  Location: [facility];  Service: Gynecology;  Laterality: N/A;   LYMPH NODE DISSECTION N/A 03/14/2017   Procedure: LYMPH NODE DISSECTION;  Surgeon: Leida Lauth, MD;  Location: [facility];  Service: Gynecology;  Laterality: N/A;   OMENTECTOMY N/A 03/14/2017   Procedure: OMENTECTOMY;  Surgeon: Leida Lauth, MD;  Location: [facility];  Service: Gynecology;  Laterality: N/A;   PORTA CATH INSERTION N/A 11/27/2016   Procedure: Shelda Pal Cath Insertion;  Surgeon: Annice Needy, MD;  Location: [facility];  Service: Cardiovascular;  Laterality: N/A;    SOCIAL HISTORY: Social History   Tobacco Use   Smoking status: Never   Smokeless tobacco: Never  Vaping Use   Vaping status: Never Used  Substance Use Topics   Alcohol use: Not Currently   Drug use: No     FAMILY HISTORY Family History  Problem Relation Age of Onset   Throat cancer Cousin    Throat cancer Cousin    Leukemia Cousin     ALLERGIES:  is allergic to omeprazole.  MEDICATIONS:  Current Outpatient Medications  Medication Sig Dispense Refill   aspirin 81 MG chewable tablet Chew 1 tablet (81 mg total) by mouth daily. 30 tablet 0    cholecalciferol (VITAMIN D3) 25 MCG (1000 UNIT) tablet Take 2,000 Units by mouth daily.     Cyanocobalamin (B-12) 1000 MCG CAPS Take 1,000 mg by mouth daily.     empagliflozin (JARDIANCE) 10 MG TABS tablet Take 1 tablet (10 mg total) by mouth daily before breakfast. 90 tablet 3   feeding supplement (ENSURE ENLIVE / ENSURE PLUS) LIQD Take 237 mLs by mouth 3 (three) times daily between meals. 14220 mL 0   Ferrous Sulfate (IRON) 325 (65 Fe) MG TABS Take 325 mg by mouth daily.     furosemide (LASIX) 40 MG tablet  Take one tablet daily (can take extra tab in afternoon if gain 3 lbs in a day or 5 lbs in a week) (Patient taking differently: Take 40 mg by mouth daily. Take one tablet daily (can take extra tab in afternoon if gain 3 lbs in a day or 5 lbs in a week)) 90 tablet 3   hydrALAZINE (APRESOLINE) 100 MG tablet Take 1 tablet (100 mg total) by mouth 3 (three) times daily. 30 tablet 0   levothyroxine (SYNTHROID) 100 MCG tablet Take 1 tablet (100 mcg total) by mouth daily. 90 tablet 3   lidocaine-prilocaine (EMLA) cream Apply 1 application topically as needed. Apply small amount to port site at least 1 hour prior to it being accessed, cover with plastic wrap 30 g 1   ondansetron (ZOFRAN) 4 MG tablet Take 1 tablet (4 mg total) by mouth every 8 (eight) hours as needed for nausea or vomiting. 60 tablet 3   sacubitril-valsartan (ENTRESTO) 49-51 MG Take 1 tablet by mouth 2 (two) times daily. 180 tablet 3   spironolactone (ALDACTONE) 25 MG tablet Take 1 tablet (25 mg total) by mouth daily. (Patient taking differently: Take 25 mg by mouth every Monday, Wednesday, and Friday at 6 PM.) 90 tablet 3   spironolactone (ALDACTONE) 25 MG tablet Take by mouth.     No current facility-administered medications for this visit.   Facility-Administered Medications Ordered in Other Visits  Medication Dose Route Frequency Provider Last Rate Last Admin   pegfilgrastim (NEULASTA ONPRO KIT) 6 MG/0.6ML injection              PHYSICAL EXAMINATION:  ECOG PERFORMANCE STATUS: 0 - Asymptomatic Vitals:   12/25/22 0935  BP: 122/64  Pulse: 75  Resp: 18  Temp: 97.6 F (36.4 C)  SpO2: 100%     Filed Weights   12/25/22 0935  Weight: 107 lb 3.2 oz (48.6 kg)      Physical Exam Constitutional:      General: She is not in acute distress.    Appearance: She is not diaphoretic.     Comments: Thin built, she walks independantly  HENT:     Head: Normocephalic.  Eyes:     General: No scleral icterus.    Pupils: Pupils are equal, round, and reactive to light.  Neck:     Vascular: No JVD.  Cardiovascular:     Rate and Rhythm: Normal rate.  Pulmonary:     Effort: Pulmonary effort is normal. No respiratory distress.     Breath sounds: Normal breath sounds.  Abdominal:     General: Bowel sounds are normal. There is no distension.     Palpations: Abdomen is soft.  Musculoskeletal:        General: Normal range of motion.     Cervical back: Normal range of motion and neck supple.     Right lower leg: No edema.     Left lower leg: No edema.  Lymphadenopathy:     Cervical: No cervical adenopathy.  Skin:    General: Skin is warm and dry.     Findings: No rash.     Comments: Right anterior medi port +   Neurological:     Mental Status: She is alert and oriented to person, place, and time. Mental status is at baseline.     Motor: No abnormal muscle tone.  Psychiatric:        Mood and Affect: Mood and affect normal.        LABORATORY DATA: I  have personally reviewed the data as listed:     Latest Ref Rng & Units 12/25/2022    9:27 AM 09/11/2022    1:35 PM 08/09/2022    1:07 PM  CBC  WBC 4.0 - 10.5 K/uL 3.1  3.7  3.2   Hemoglobin 12.0 - 15.0 g/dL 7.7  8.3  8.3   Hematocrit 36.0 - 46.0 % 24.1  25.6  26.3   Platelets 150 - 400 K/uL 128  134  135       Latest Ref Rng & Units 12/25/2022    9:27 AM 09/11/2022    1:35 PM 07/11/2022    9:22 AM  CMP  Glucose 70 - 99 mg/dL 94  643  76   BUN 8 - 23  mg/dL 35  38  28   Creatinine 0.44 - 1.00 mg/dL 3.29  5.18  8.41   Sodium 135 - 145 mmol/L 131  131  132   Potassium 3.5 - 5.1 mmol/L 4.4  4.5  4.2   Chloride 98 - 111 mmol/L 105  105  104   CO2 22 - 32 mmol/L 19  21  23    Calcium 8.9 - 10.3 mg/dL 9.6  9.2  9.4   Total Protein 6.5 - 8.1 g/dL 7.3  7.6  7.1   Total Bilirubin 0.3 - 1.2 mg/dL 0.4  0.5  0.3   Alkaline Phos 38 - 126 U/L 65  72  64   AST 15 - 41 U/L 15  14  17    ALT 0 - 44 U/L 9  8  9       RADIOGRAPHIC STUDIES: I have personally reviewed the radiological images as listed and agreed with the findings in the report. CT CHEST ABDOMEN PELVIS WO CONTRAST  Result Date: 12/12/2022 CLINICAL DATA:  History of ovarian cancer, surveillance. * Tracking Code: BO * EXAM: CT CHEST, ABDOMEN AND PELVIS WITHOUT CONTRAST TECHNIQUE: Multidetector CT imaging of the chest, abdomen and pelvis was performed following the standard protocol without IV contrast. RADIATION DOSE REDUCTION: This exam was performed according to the departmental dose-optimization program which includes automated exposure control, adjustment of the mA and/or kV according to patient size and/or use of iterative reconstruction technique. COMPARISON:  Multiple priors including most recent CT Aug 23, 2022 FINDINGS: CT CHEST FINDINGS Cardiovascular: Right chest Port-A-Cath with tip at the superior cavoatrial junction. Normal caliber thoracic aorta. Normal size heart. No significant pericardial effusion/thickening. Mediastinum/Nodes: No suspicious thyroid nodule. No pathologically enlarged mediastinal, hilar or axillary lymph nodes. The esophagus is grossly unremarkable. Lungs/Pleura: Biapical pleuroparenchymal scarring. Stable solid 6 mm left lower lobe pulmonary nodule on image 111/4. No new suspicious pulmonary nodules or masses. Scattered atelectasis/scarring. Musculoskeletal: No suspicious lytic or blastic lesion of bone. CT ABDOMEN PELVIS FINDINGS Hepatobiliary: Unremarkable noncontrast  enhanced appearance of the hepatic parenchyma. Gallbladder is unremarkable. No biliary ductal dilation. Pancreas: No pancreatic ductal dilation or evidence of acute inflammation. Spleen: No splenomegaly. Adrenals/Urinary Tract: Bilateral adrenal glands appear normal. No hydronephrosis. Urinary bladder is nondistended Stomach/Bowel: Radiopaque enteric contrast material traverses the hepatic flexure. Stomach is unremarkable for degree of distension. No pathologic dilation of small or large bowel. Moderate volume of formed stool in the colon. No evidence of acute bowel inflammation. Vascular/Lymphatic: Aortic atherosclerosis. Normal caliber abdominal aorta. Smooth IVC contours. Retroperitoneal surgical clips. No pathologically enlarged abdominal or pelvic lymph nodes. Reproductive: Uterus is surgically absent without suspicious nodularity along the vaginal cuff. No discrete adnexal mass. Other: Soft tissue nodule adjacent to the  rectum in the posterior pelvis measures 20 x 12 mm on image 112/2 previously 17 x 13 mm. No significant abdominopelvic free fluid. Musculoskeletal: No aggressive lytic or blastic lesion of bone. IMPRESSION: 1. Similar size of the soft tissue nodule adjacent to the rectum in the posterior pelvis measures similar prior at 20 x 12 mm previously 17 x 13 mm and most recent remote imaging 19 x 11 mm. 2. Stable solid 6 mm left lower lobe pulmonary nodule. 3. No new or progressive findings in the chest, abdomen or pelvis. 4.  Aortic Atherosclerosis (ICD10-I70.0). Electronically Signed   By: Maudry Mayhew M.D.   On: 12/12/2022 11:58

## 2022-12-25 NOTE — Assessment & Plan Note (Signed)
Follow up with cardiology

## 2022-12-26 LAB — CA 125: Cancer Antigen (CA) 125: 16.9 U/mL (ref 0.0–38.1)

## 2023-01-16 DIAGNOSIS — E875 Hyperkalemia: Secondary | ICD-10-CM | POA: Diagnosis not present

## 2023-01-16 DIAGNOSIS — N1831 Chronic kidney disease, stage 3a: Secondary | ICD-10-CM | POA: Diagnosis not present

## 2023-01-16 DIAGNOSIS — I129 Hypertensive chronic kidney disease with stage 1 through stage 4 chronic kidney disease, or unspecified chronic kidney disease: Secondary | ICD-10-CM | POA: Diagnosis not present

## 2023-01-16 DIAGNOSIS — N1832 Chronic kidney disease, stage 3b: Secondary | ICD-10-CM | POA: Diagnosis not present

## 2023-01-16 DIAGNOSIS — N2581 Secondary hyperparathyroidism of renal origin: Secondary | ICD-10-CM | POA: Diagnosis not present

## 2023-01-16 DIAGNOSIS — R600 Localized edema: Secondary | ICD-10-CM | POA: Diagnosis not present

## 2023-01-16 DIAGNOSIS — R809 Proteinuria, unspecified: Secondary | ICD-10-CM | POA: Diagnosis not present

## 2023-01-16 DIAGNOSIS — I10 Essential (primary) hypertension: Secondary | ICD-10-CM | POA: Diagnosis not present

## 2023-01-23 DIAGNOSIS — I10 Essential (primary) hypertension: Secondary | ICD-10-CM | POA: Diagnosis not present

## 2023-01-23 DIAGNOSIS — N2581 Secondary hyperparathyroidism of renal origin: Secondary | ICD-10-CM | POA: Diagnosis not present

## 2023-01-23 DIAGNOSIS — N1832 Chronic kidney disease, stage 3b: Secondary | ICD-10-CM | POA: Diagnosis not present

## 2023-01-23 DIAGNOSIS — I129 Hypertensive chronic kidney disease with stage 1 through stage 4 chronic kidney disease, or unspecified chronic kidney disease: Secondary | ICD-10-CM | POA: Diagnosis not present

## 2023-02-01 DIAGNOSIS — I5032 Chronic diastolic (congestive) heart failure: Secondary | ICD-10-CM | POA: Diagnosis not present

## 2023-02-01 DIAGNOSIS — D631 Anemia in chronic kidney disease: Secondary | ICD-10-CM | POA: Diagnosis not present

## 2023-02-01 DIAGNOSIS — E039 Hypothyroidism, unspecified: Secondary | ICD-10-CM | POA: Diagnosis not present

## 2023-02-01 DIAGNOSIS — I130 Hypertensive heart and chronic kidney disease with heart failure and stage 1 through stage 4 chronic kidney disease, or unspecified chronic kidney disease: Secondary | ICD-10-CM | POA: Diagnosis not present

## 2023-02-01 DIAGNOSIS — N1832 Chronic kidney disease, stage 3b: Secondary | ICD-10-CM | POA: Diagnosis not present

## 2023-02-01 DIAGNOSIS — E440 Moderate protein-calorie malnutrition: Secondary | ICD-10-CM | POA: Diagnosis not present

## 2023-02-05 ENCOUNTER — Inpatient Hospital Stay: Payer: Medicare Other | Attending: Oncology

## 2023-02-05 DIAGNOSIS — C561 Malignant neoplasm of right ovary: Secondary | ICD-10-CM | POA: Insufficient documentation

## 2023-02-05 DIAGNOSIS — Z452 Encounter for adjustment and management of vascular access device: Secondary | ICD-10-CM | POA: Diagnosis not present

## 2023-02-05 DIAGNOSIS — C569 Malignant neoplasm of unspecified ovary: Secondary | ICD-10-CM

## 2023-02-05 LAB — CBC WITH DIFFERENTIAL (CANCER CENTER ONLY)
Abs Immature Granulocytes: 0.01 10*3/uL (ref 0.00–0.07)
Basophils Absolute: 0 10*3/uL (ref 0.0–0.1)
Basophils Relative: 1 %
Eosinophils Absolute: 0.1 10*3/uL (ref 0.0–0.5)
Eosinophils Relative: 2 %
HCT: 23.6 % — ABNORMAL LOW (ref 36.0–46.0)
Hemoglobin: 7.7 g/dL — ABNORMAL LOW (ref 12.0–15.0)
Immature Granulocytes: 0 %
Lymphocytes Relative: 21 %
Lymphs Abs: 0.7 10*3/uL (ref 0.7–4.0)
MCH: 32.8 pg (ref 26.0–34.0)
MCHC: 32.6 g/dL (ref 30.0–36.0)
MCV: 100.4 fL — ABNORMAL HIGH (ref 80.0–100.0)
Monocytes Absolute: 0.4 10*3/uL (ref 0.1–1.0)
Monocytes Relative: 12 %
Neutro Abs: 2 10*3/uL (ref 1.7–7.7)
Neutrophils Relative %: 64 %
Platelet Count: 127 10*3/uL — ABNORMAL LOW (ref 150–400)
RBC: 2.35 MIL/uL — ABNORMAL LOW (ref 3.87–5.11)
RDW: 12.4 % (ref 11.5–15.5)
WBC Count: 3.2 10*3/uL — ABNORMAL LOW (ref 4.0–10.5)
nRBC: 0 % (ref 0.0–0.2)

## 2023-02-05 LAB — SAMPLE TO BLOOD BANK

## 2023-02-05 MED ORDER — HEPARIN SOD (PORK) LOCK FLUSH 100 UNIT/ML IV SOLN
500.0000 [IU] | Freq: Once | INTRAVENOUS | Status: AC
  Administered 2023-02-05: 500 [IU] via INTRAVENOUS
  Filled 2023-02-05: qty 5

## 2023-02-05 MED ORDER — SODIUM CHLORIDE 0.9% FLUSH
10.0000 mL | INTRAVENOUS | Status: DC | PRN
  Administered 2023-02-05: 10 mL via INTRAVENOUS
  Filled 2023-02-05: qty 10

## 2023-02-19 ENCOUNTER — Inpatient Hospital Stay: Payer: Medicare Other

## 2023-02-28 ENCOUNTER — Other Ambulatory Visit: Payer: Self-pay | Admitting: Family

## 2023-03-26 ENCOUNTER — Inpatient Hospital Stay: Payer: Medicare Other | Attending: Oncology

## 2023-03-26 ENCOUNTER — Encounter: Payer: Self-pay | Admitting: Oncology

## 2023-03-26 ENCOUNTER — Inpatient Hospital Stay (HOSPITAL_BASED_OUTPATIENT_CLINIC_OR_DEPARTMENT_OTHER): Payer: Medicare Other | Admitting: Oncology

## 2023-03-26 VITALS — BP 126/67 | HR 64 | Temp 98.2°F | Resp 18 | Wt 107.5 lb

## 2023-03-26 DIAGNOSIS — Z452 Encounter for adjustment and management of vascular access device: Secondary | ICD-10-CM | POA: Diagnosis not present

## 2023-03-26 DIAGNOSIS — C569 Malignant neoplasm of unspecified ovary: Secondary | ICD-10-CM

## 2023-03-26 DIAGNOSIS — C561 Malignant neoplasm of right ovary: Secondary | ICD-10-CM | POA: Insufficient documentation

## 2023-03-26 DIAGNOSIS — N189 Chronic kidney disease, unspecified: Secondary | ICD-10-CM | POA: Diagnosis not present

## 2023-03-26 DIAGNOSIS — R1900 Intra-abdominal and pelvic swelling, mass and lump, unspecified site: Secondary | ICD-10-CM | POA: Diagnosis not present

## 2023-03-26 DIAGNOSIS — I5031 Acute diastolic (congestive) heart failure: Secondary | ICD-10-CM

## 2023-03-26 DIAGNOSIS — I130 Hypertensive heart and chronic kidney disease with heart failure and stage 1 through stage 4 chronic kidney disease, or unspecified chronic kidney disease: Secondary | ICD-10-CM | POA: Insufficient documentation

## 2023-03-26 DIAGNOSIS — D539 Nutritional anemia, unspecified: Secondary | ICD-10-CM

## 2023-03-26 LAB — CMP (CANCER CENTER ONLY)
ALT: 8 U/L (ref 0–44)
AST: 13 U/L — ABNORMAL LOW (ref 15–41)
Albumin: 4.4 g/dL (ref 3.5–5.0)
Alkaline Phosphatase: 65 U/L (ref 38–126)
Anion gap: 8 (ref 5–15)
BUN: 32 mg/dL — ABNORMAL HIGH (ref 8–23)
CO2: 21 mmol/L — ABNORMAL LOW (ref 22–32)
Calcium: 9.6 mg/dL (ref 8.9–10.3)
Chloride: 102 mmol/L (ref 98–111)
Creatinine: 1.38 mg/dL — ABNORMAL HIGH (ref 0.44–1.00)
GFR, Estimated: 41 mL/min — ABNORMAL LOW (ref >60)
Glucose, Bld: 90 mg/dL (ref 70–99)
Potassium: 4.9 mmol/L (ref 3.5–5.1)
Sodium: 131 mmol/L — ABNORMAL LOW (ref 135–145)
Total Bilirubin: 0.5 mg/dL (ref <1.2)
Total Protein: 7.3 g/dL (ref 6.5–8.1)

## 2023-03-26 LAB — CBC WITH DIFFERENTIAL (CANCER CENTER ONLY)
Abs Immature Granulocytes: 0.01 10*3/uL (ref 0.00–0.07)
Basophils Absolute: 0 10*3/uL (ref 0.0–0.1)
Basophils Relative: 1 %
Eosinophils Absolute: 0.1 10*3/uL (ref 0.0–0.5)
Eosinophils Relative: 2 %
HCT: 24.1 % — ABNORMAL LOW (ref 36.0–46.0)
Hemoglobin: 8 g/dL — ABNORMAL LOW (ref 12.0–15.0)
Immature Granulocytes: 0 %
Lymphocytes Relative: 23 %
Lymphs Abs: 0.8 10*3/uL (ref 0.7–4.0)
MCH: 32.9 pg (ref 26.0–34.0)
MCHC: 33.2 g/dL (ref 30.0–36.0)
MCV: 99.2 fL (ref 80.0–100.0)
Monocytes Absolute: 0.4 10*3/uL (ref 0.1–1.0)
Monocytes Relative: 12 %
Neutro Abs: 2.3 10*3/uL (ref 1.7–7.7)
Neutrophils Relative %: 62 %
Platelet Count: 130 10*3/uL — ABNORMAL LOW (ref 150–400)
RBC: 2.43 MIL/uL — ABNORMAL LOW (ref 3.87–5.11)
RDW: 12.9 % (ref 11.5–15.5)
WBC Count: 3.7 10*3/uL — ABNORMAL LOW (ref 4.0–10.5)
nRBC: 0 % (ref 0.0–0.2)

## 2023-03-26 LAB — IRON AND TIBC
Iron: 81 ug/dL (ref 28–170)
Saturation Ratios: 25 % (ref 10.4–31.8)
TIBC: 319 ug/dL (ref 250–450)
UIBC: 238 ug/dL

## 2023-03-26 LAB — RETIC PANEL
Immature Retic Fract: 3.3 % (ref 2.3–15.9)
RBC.: 2.41 MIL/uL — ABNORMAL LOW (ref 3.87–5.11)
Retic Count, Absolute: 24.8 10*3/uL (ref 19.0–186.0)
Retic Ct Pct: 1 % (ref 0.4–3.1)
Reticulocyte Hemoglobin: 34.6 pg (ref >27.9)

## 2023-03-26 LAB — FERRITIN: Ferritin: 267 ng/mL (ref 11–307)

## 2023-03-26 MED ORDER — SODIUM CHLORIDE 0.9% FLUSH
10.0000 mL | INTRAVENOUS | Status: DC | PRN
  Administered 2023-03-26: 10 mL via INTRAVENOUS
  Filled 2023-03-26: qty 10

## 2023-03-26 MED ORDER — HEPARIN SOD (PORK) LOCK FLUSH 100 UNIT/ML IV SOLN
500.0000 [IU] | Freq: Once | INTRAVENOUS | Status: AC
  Administered 2023-03-26: 500 [IU] via INTRAVENOUS
  Filled 2023-03-26: qty 5

## 2023-03-26 NOTE — Assessment & Plan Note (Addendum)
No significant improvement after holding chemotherapy.  No significant improvement after IV Venofer treatments and B12 injections. 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  continue observation. I will hold off erythropoietin replacement therapy due to its angiogenesis effects and ovarian cancer history.  She can get blood transfusion if symptomatic.  Lab Results  Component Value Date   HGB 8.0 (L) 03/26/2023   TIBC 319 03/26/2023   IRONPCTSAT 25 03/26/2023   FERRITIN 267 03/26/2023    Ferritin is at goal. Hb stable.

## 2023-03-26 NOTE — Assessment & Plan Note (Signed)
Continue port flush Q6-8 weeks.

## 2023-03-26 NOTE — Assessment & Plan Note (Signed)
Follow up with cardiology

## 2023-03-26 NOTE — Progress Notes (Signed)
Hematology/Oncology Progress note [phone] Fax:([phone]) [phone]       CHIEF COMPLAINTS/PURPOSE OF VISIT Follow up for ovarian cancer.  ASSESSMENT & PLAN:   Cancer Staging  Malignant neoplasm of ovary (HCC) Staging form: Ovary, Fallopian Tube, and Primary Peritoneal Carcinoma, AJCC 8th Edition - Clinical stage from 11/22/2016: Stage IIIC (cT3c, cN1b, cM0) - Signed by Rickard Patience, MD on 11/22/2016   Malignant neoplasm of ovary (HCC) 05/11/2022 CT chest abdomen pelvis w contrast showed stable disease.   Labs are reviewed and discussed with patient. 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 Folate receptor alpha positive   Recommend continue observation, and in the future, when she progresses, consider Mirvetuximab  Obtain repeat CT scan    Macrocytic anemia No significant improvement after holding chemotherapy.  No significant improvement after IV Venofer treatments and B12 injections. 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  continue observation. I will hold off erythropoietin replacement therapy due to its angiogenesis effects and ovarian cancer history.  She can get blood transfusion if symptomatic.  Lab Results  Component Value Date   HGB 8.0 (L) 03/26/2023   TIBC 319 03/26/2023   IRONPCTSAT 25 03/26/2023   FERRITIN 267 03/26/2023    Ferritin is at goal. Hb stable.   Port-A-Cath in place Continue port flush Q6-8 weeks  CHF (congestive heart failure) (HCC) Follow up with cardiology    Orders Placed This Encounter  Procedures   CT CHEST ABDOMEN PELVIS WO CONTRAST    Standing Status:   Future    Expected Date:   04/02/2023    Expiration Date:   03/25/2024    Preferred imaging location?:   [facility]    If indicated for the ordered procedure, I authorize the administration of oral contrast media per Radiology protocol:   Yes    Does the patient have a contrast media/X-ray dye allergy?:   No   CBC with  Differential (Cancer Center Only)    Standing Status:   Future    Expected Date:   06/24/2023    Expiration Date:   03/25/2024   CMP (Cancer Center only)    Standing Status:   Future    Expected Date:   06/24/2023    Expiration Date:   03/25/2024   Iron and TIBC    Standing Status:   Future    Expected Date:   06/24/2023    Expiration Date:   03/25/2024   Ferritin    Standing Status:   Future    Expected Date:   06/24/2023    Expiration Date:   03/25/2024   Retic Panel    Standing Status:   Future    Expected Date:   06/24/2023    Expiration Date:   03/25/2024   CA 125    Standing Status:   Future    Expected Date:   06/24/2023    Expiration Date:   03/25/2024   Follow-up in 3 months.   All questions were answered. The patient knows to call the clinic with any problems, questions or concerns.  Rickard Patience, MD, PhD [facility] Hematology Oncology 03/26/2023      HISTORY OF PRESENTING ILLNESS: Katie Hill 72 y.o. female presents for follow up of management of recurrent  ovarian cancer. Oncology History  Malignant neoplasm of ovary (HCC)  09/20/2011 Imaging   CT chest abdomen pelvis w contrast 1. No significant interval changed compared with the previous exam.2. Stable appearance of soft tissue  attenuating lesion in the posterolateral right hemipelvis. This may represent a treated site of peritoneal disease.3. Unchanged appearance of borderline enlarged left periaortic and aortocaval retroperitoneal lymph nodes.4. Unchanged 6 mm left lower lobe lung nodule. 5. No new signs of metastatic disease within the chest, abdomen or pelvis. 6. Aortic Atherosclerosis (ICD10-I70.0) and Emphysema    11/20/2016 Initial Diagnosis   Malignant neoplasm of ovary   Pathology 11/16/2016 Surgical Pathology  CASE: [id number]  PATIENT: Katie Hill  Surgical Pathology Report   SPECIMEN SUBMITTED:  A. Retroperitoneal adenopathy, left  DIAGNOSIS:  A. LYMPH NODE, LEFT RETROPERITONEAL;  CT-GUIDED CORE BIOPSY:  - METASTATIC HIGH-GRADE SEROUS CARCINOMA.  Pathology 03/14/2017   DIAGNOSIS:  A. OMENTUM; OMENTECTOMY:  - NO TUMOR SEEN.  - ONE NEGATIVE LYMPH NODE (0/1).   B.  RIGHT FALLOPIAN TUBE AND OVARY; SALPINGO-OOPHORECTOMY:  - SMALL FOCI OF HIGH GRADE SEROUS CARCINOMA INVOLVING THE OVARY.  - MARKED THERAPY RELATED CHANGE.  - NO TUMOR SEEN IN THE FALLOPIAN TUBE.   C.  UTERUS, CERVIX, LEFT FALLOPIAN TUBE AND OVARY; HYSTERECTOMY AND LEFT  SALPINGO-OOPHORECTOMY:  - NABOTHIAN CYSTS.  - CYSTIC ATROPHY OF THE ENDOMETRIUM.  - SEROSAL ADHESIONS.  - UNREMARKABLE FALLOPIAN TUBE.  - OVARY SHOWING TREATMENT RELATED CHANGE.   D.  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    12/01/2016 -  Chemotherapy   Carboplatin and taxol x 4 neoadjuvant chemotherapy    03/14/2017 Surgery   Patient had debulking surgery on 03/14/2017. She had a laparoscopy with conversion to laparotomy, total hysterectomy, with bilateral salpingo oophorectomy, right aortic lymph node dissection, omentectomy. Pathology showed small foci of residual disease in ovary.   HRD positive, declined Olarparib maintenance trial    03/28/2017 -  Chemotherapy   Adjuvant carbo and taxol x3 cycles   02/28/2018 Progression   Local Recurrence. CA125 146 CT abdomen pelvis w contrast showed interval enlargement of an aortocaval lymph node which currently measures 1.4 cm in short axis (axial image 75 of series 2). This lymph node is in close proximity to the previously resected retroperitoneal lymphadenopathy and is very concerning for an additional nodal metastasis.    03/15/2018 -  Chemotherapy   03/15/2018-05/17/2018 Carboplatin and Taxol x 4.  CA125 decrease from 49.7 to 6 after 4 cycles of treatment.  05/17/2018-10/29/20   Olarparib 300mg  BID    11/05/2018 Imaging   MRI abdomenPelvis on 11/05/2018 showed Interval progression of, now measuring 2.7 x  2.3 cm and concerning for progression of metastatic disease.retrocaval lymph node in the abdomen Olaparib was held for short period of time. Her CA125 trended down again.  Dr. Johnnette Litter recommending resume olaparib and continue monitor.   10/06/2020 Progression    CT chest abdomen pelvis with contrast showed new lymph nodes in the prevascular space of the upper anterior mediastinum most consistent with metastasis.  Single new right lower lobe pulmonary nodule is concerning for early pulmonary metastasis.  Interval enlargement of the left periaortic retroperitoneal lymph node.  Stable adjacent aorto caval adenopathy.    Genetic Testing   Genetic testing negative for 83 genes on [facility]'s Multi-Cancer panel (ALK, APC, ATM, AXIN2, BAP1, BARD1, BLM, BMPR1A, BRCA1, BRCA2, BRIP1, CASR, CDC73, CDH1, CDK4, CDKN1B, CDKN1C, CDKN2A, CEBPA, CHEK2, CTNNA1, DICER1, DIS3L2, EGFR, EPCAM, FH, FLCN, GATA2, GPC3, GREM1, HOXB13, HRAS, KIT, MAX, MEN1, MET, MITF, MLH1, MSH2, MSH3, MSH6, MUTYH, NBN, NF1, NF2, NTHL1, PALB2, PDGFRA, PHOX2B, PMS2, POLD1, POLE, POT1, PRKAR1A, PTCH1, PTEN, RAD50, RAD51C, RAD51D, RB1,  RECQL4, RET, RUNX1, SDHA, SDHAF2, SDHB, SDHC, SDHD, SMAD4, SMARCA4, SMARCB1, SMARCE1, STK11, SUFU, TERC, TERT, TMEM127, TP53, TSC1, TSC2, VHL, WRN, WT1).   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  Myraid testing negative for somatic BRACA1/2, positive for HRD.    11/03/2020 - 11/18/2021 Chemotherapy   Liposomal Doxorubicin + Bevacizumab q28d   11/03/20 -05/20/21 Doxil + Bevacizumab q28d  06/03/21 Doxil only due to proteinuria.  07/01/21-07/15/21  Resumed on Doxil + Bevacizumab 07/29/21 Doxil only due to proteinuria.  11/18/21 last dose of Doxil. Stopped due to CHF   10/14/2021 Echocardiogram    Left ventricular ejection fraction, by estimation, is 55 to 60%   12/16/2021 Imaging   [place] lower extremities 1. No evidence of deep venous thrombosis in either lower extremity. 2. 1.9 cm left  popliteal fossa Baker's cyst.   12/23/2021 Echocardiogram   LVEF 55%   12/26/2021 Imaging   CT chest abdomen pelvis w contrast   Chest Impression:   1. New RIGHT pleural effusion and new mild nodularity along the RIGHT oblique fissure. Recommend close attention on follow-up for potential pleural metastasis in the RIGHT hemithorax. 2. Stable LEFT lobe pulmonary nodule.3. No mediastinal lymphadenopathy.   Abdomen / Pelvis Impression:   1. Stable soft tissue lesion in the deep RIGHT pelvis. 2. Mass stable small periaortic lymph nodes. 3. No intraperitoneal free fluid the abdomen or pelvis.   01/20/2022 - 01/23/2022 Hospital Admission   Hospitalized due to acute on chronic diastolic CHF, hypertension urgency, She was seen by cardiology and nephrology. BNP was significantly elevated at 4500 She was discharged home with Losartan, Aldactone, Lasix, beta blocker and hydralazine.    02/21/2022 Imaging   CT chest abdomen pelvis w contrast Stable small soft tissue lesion in the right perirectal lesion. Stable mild retroperitoneal and retrocrural lymphadenopathy.Stable 6 mm left lower lobe pulmonary nodule.No new or progressive disease within the chest, abdomen, or pelvis   02/22/2022 Imaging   CT chest abdomen pelvis w contrast Stable small soft tissue lesion in the right perirectal lesion. Stable mild retroperitoneal and retrocrural lymphadenopathy. Stable 6 mm left lower lobe pulmonary nodule. No new or progressive disease within the chest, abdomen, or pelvis.   05/11/2022 Imaging   CT chest abdomen pelvis without contrast showed 1. Stable examination without convincing evidence of new or progressive disease in the chest, abdomen or pelvis on this noncontrast enhanced CT. 2. Stable small soft tissue right perirectal soft tissue lesion.3. Stable 6 mm left lower lobe pulmonary nodule. 4. Stable prominent left supraclavicular and retroperitoneal lymph nodes. 5. Large volume of formed stool  in the colon. Correlate for constipation. 6.  Aortic Atherosclerosis   07/21/2022 Bone Marrow Biopsy   bone marrow is slightly hypercellular at best with trilineage hematopoiesis.  There are generally mild dyspoietic changes present particularly in the erythroid and megakaryocytic cell lines.  No increase in blastic cells identified.  The overall changes are limited and not considered specific or diagnostic of a myeloid neoplasm and may be secondary in nature in this setting.  Nonetheless, correlation with cytogenetic and FISH studies is recommended.  There is no diagnostic evidence of a lymphoproliferative process, metastatic carcinoma or plasma cell neoplasm.  MDS FISH negative.  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    12/12/2022 Imaging   CT chest abdomen pelvis w contrast showed 1. Similar size of the soft tissue nodule adjacent to the rectum  in the posterior pelvis measures similar prior at 20 x 12 mm previously 17 x 13 mm and most recent remote imaging 19 x 11 mm. 2. Stable solid 6 mm left lower lobe pulmonary nodule. 3. No new or progressive findings in the chest, abdomen or pelvis. 4.  Aortic Atherosclerosis (ICD10-I70.0)   High grade ovarian cancer [facility])    INTERVAL HISTORY Katie Hill is a 72 y.o. female who has above history reviewed by me today presents for follow up visit for recurrent Ovarian cancer Patient reports no new problems since last visit.  She denies feeling very tired or fatigued. CHF is controlled well.  Denies weight loss, fever, chills, fatigue, night sweats.   . Review of Systems  Constitutional:  Positive for fatigue. Negative for appetite change, chills and fever.  HENT:   Negative for hearing loss and voice change.   Eyes:  Negative for eye problems.  Respiratory:  Negative for chest tightness and cough.   Cardiovascular:  Negative for chest pain and leg  swelling.  Gastrointestinal:  Negative for abdominal distention, abdominal pain and blood in stool.  Endocrine: Negative for hot flashes.  Genitourinary:  Negative for difficulty urinating and frequency.   Musculoskeletal:  Negative for arthralgias.  Skin:  Negative for itching and rash.  Neurological:  Negative for extremity weakness.  Hematological:  Negative for adenopathy.  Psychiatric/Behavioral:  Negative for confusion.      [name] MEDICAL HISTORY: Past Medical History:  Diagnosis Date   (HFpEF) heart failure with preserved ejection fraction (HCC)    a. 12/2021 Echo: EF 55%, no rwma, nl RV fxn, mild MR, mild-mod TR. Triv AI.   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   Pelvic mass in female     SURGICAL HISTORY: Past Surgical History:  Procedure Laterality Date   APPENDECTOMY     IR BONE MARROW BIOPSY & ASPIRATION  07/21/2022   LAPAROSCOPY N/A 03/14/2017   Procedure: LAPAROSCOPY OPERATIVE;  Surgeon: Leida Lauth, MD;  Location: [facility] OR[facility];  Service: Gynecology;  Laterality: N/A;   LAPAROTOMY N/A 03/14/2017   Procedure: LAPAROTOMY;  Surgeon: Leida Lauth, MD;  Location: [facility];  Service: Gynecology;  Laterality: N/A;   LYMPH NODE DISSECTION N/A 03/14/2017   Procedure: LYMPH NODE DISSECTION;  Surgeon: Leida Lauth, MD;  Location: [facility] OR[facility];  Service: Gynecology;  Laterality: N/A;   OMENTECTOMY N/A 03/14/2017   Procedure: OMENTECTOMY;  Surgeon: Leida Lauth, MD;  Location: [facility];  Service: Gynecology;  Laterality: N/A;   PORTA CATH INSERTION N/A 11/27/2016   Procedure: Shelda Pal Cath Insertion;  Surgeon: Annice Needy, MD;  Location: [facility];  Service: Cardiovascular;  Laterality: N/A;    SOCIAL HISTORY: Social History   Tobacco  Use   Smoking status: Never   Smokeless tobacco: Never  Vaping Use   Vaping status: Never Used  Substance Use Topics   Alcohol use: Not Currently   Drug use: No     FAMILY HISTORY Family History  Problem Relation Age of Onset   Throat cancer Cousin    Throat cancer Cousin    Leukemia Cousin     ALLERGIES:  is allergic to omeprazole.  MEDICATIONS:  Current Outpatient Medications  Medication Sig Dispense Refill  aspirin 81 MG chewable tablet Chew 1 tablet (81 mg total) by mouth daily. 30 tablet 0   cholecalciferol (VITAMIN D3) 25 MCG (1000 UNIT) tablet Take 2,000 Units by mouth daily.     Cyanocobalamin (B-12) 1000 MCG CAPS Take 1,000 mg by mouth daily.     empagliflozin (JARDIANCE) 10 MG TABS tablet Take 1 tablet (10 mg total) by mouth daily before breakfast. 90 tablet 3   feeding supplement (ENSURE ENLIVE / ENSURE PLUS) LIQD Take 237 mLs by mouth 3 (three) times daily between meals. 14220 mL 0   Ferrous Sulfate (IRON) 325 (65 Fe) MG TABS Take 325 mg by mouth daily.     furosemide (LASIX) 40 MG tablet TAKE ONE TABLET BY MOUTH EVERY DAY (CAN TAKE EXTRA TABLET IN AFTERNOON IF GAIN 3 POUNDS IN A DAY OR 5 POUNDS IN A WEEK) 90 tablet 2   hydrALAZINE (APRESOLINE) 100 MG tablet Take 1 tablet (100 mg total) by mouth 3 (three) times daily. 30 tablet 0   levothyroxine (SYNTHROID) 100 MCG tablet Take 1 tablet (100 mcg total) by mouth daily. 90 tablet 3   lidocaine-prilocaine (EMLA) cream Apply 1 application topically as needed. Apply small amount to port site at least 1 hour prior to it being accessed, cover with plastic wrap 30 g 1   sacubitril-valsartan (ENTRESTO) 49-51 MG Take 1 tablet by mouth 2 (two) times daily. 180 tablet 3   spironolactone (ALDACTONE) 25 MG tablet Take 1 tablet (25 mg total) by mouth daily. (Patient taking differently: Take 25 mg by mouth every Monday, Wednesday, and Friday at 6 PM.) 90 tablet 3   ondansetron (ZOFRAN) 4 MG tablet Take 1 tablet (4 mg total) by mouth  every 8 (eight) hours as needed for nausea or vomiting. (Patient not taking: Reported on 03/26/2023) 60 tablet 3   No current facility-administered medications for this visit.   Facility-Administered Medications Ordered in Other Visits  Medication Dose Route Frequency Provider Last Rate Last Admin   pegfilgrastim (NEULASTA ONPRO KIT) 6 MG/0.6ML injection             PHYSICAL EXAMINATION:  ECOG PERFORMANCE STATUS: 0 - Asymptomatic Vitals:   03/26/23 1004  BP: 126/67  Pulse: 64  Resp: 18  Temp: 98.2 F (36.8 C)     Filed Weights   03/26/23 1004  Weight: 107 lb 8 oz (48.8 kg)      Physical Exam Constitutional:      General: She is not in acute distress.    Appearance: She is not diaphoretic.     Comments: Thin built, she walks independantly  HENT:     Head: Normocephalic.  Eyes:     General: No scleral icterus.    Pupils: Pupils are equal, round, and reactive to light.  Neck:     Vascular: No JVD.  Cardiovascular:     Rate and Rhythm: Normal rate.  Pulmonary:     Effort: Pulmonary effort is normal. No respiratory distress.     Breath sounds: Normal breath sounds.  Abdominal:     General: Bowel sounds are normal. There is no distension.     Palpations: Abdomen is soft.  Musculoskeletal:        General: Normal range of motion.     Cervical back: Normal range of motion and neck supple.     Right lower leg: No edema.     Left lower leg: No edema.  Lymphadenopathy:     Cervical: No cervical adenopathy.  Skin:  General: Skin is warm and dry.     Findings: No rash.     Comments: Right anterior medi port +   Neurological:     Mental Status: She is alert and oriented to person, place, and time. Mental status is at baseline.     Motor: No abnormal muscle tone.  Psychiatric:        Mood and Affect: Mood and affect normal.        LABORATORY DATA: I have personally reviewed the data as listed:     Latest Ref Rng & Units 03/26/2023    9:48 AM 02/05/2023     9:13 AM 12/25/2022    9:27 AM  CBC  WBC 4.0 - 10.5 K/uL 3.7  3.2  3.1   Hemoglobin 12.0 - 15.0 g/dL 8.0  7.7  7.7   Hematocrit 36.0 - 46.0 % 24.1  23.6  24.1   Platelets 150 - 400 K/uL 130  127  128       Latest Ref Rng & Units 03/26/2023    9:48 AM 12/25/2022    9:27 AM 09/11/2022    1:35 PM  CMP  Glucose 70 - 99 mg/dL 90  94  161   BUN 8 - 23 mg/dL 32  35  38   Creatinine 0.44 - 1.00 mg/dL 0.96  0.45  4.09   Sodium 135 - 145 mmol/L 131  131  131   Potassium 3.5 - 5.1 mmol/L 4.9  4.4  4.5   Chloride 98 - 111 mmol/L 102  105  105   CO2 22 - 32 mmol/L 21  19  21    Calcium 8.9 - 10.3 mg/dL 9.6  9.6  9.2   Total Protein 6.5 - 8.1 g/dL 7.3  7.3  7.6   Total Bilirubin <1.2 mg/dL 0.5  0.4  0.5   Alkaline Phos 38 - 126 U/L 65  65  72   AST 15 - 41 U/L 13  15  14    ALT 0 - 44 U/L 8  9  8       RADIOGRAPHIC STUDIES: I have personally reviewed the radiological images as listed and agreed with the findings in the report. No results found.

## 2023-03-26 NOTE — Assessment & Plan Note (Addendum)
05/11/2022 CT chest abdomen pelvis w contrast showed stable disease.   Labs are reviewed and discussed with patient. 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 Folate receptor alpha positive   Recommend continue observation, and in the future, when she progresses, consider Mirvetuximab  Obtain repeat CT scan

## 2023-03-27 LAB — CA 125: Cancer Antigen (CA) 125: 11.6 U/mL (ref 0.0–38.1)

## 2023-03-29 DIAGNOSIS — K1121 Acute sialoadenitis: Secondary | ICD-10-CM | POA: Diagnosis not present

## 2023-04-02 ENCOUNTER — Ambulatory Visit
Admission: RE | Admit: 2023-04-02 | Discharge: 2023-04-02 | Disposition: A | Discharge status: Home / Self Care | Payer: Medicare Other | Source: Ambulatory Visit | Attending: Oncology | Admitting: Oncology

## 2023-04-02 DIAGNOSIS — R1900 Intra-abdominal and pelvic swelling, mass and lump, unspecified site: Secondary | ICD-10-CM | POA: Diagnosis not present

## 2023-04-02 DIAGNOSIS — C569 Malignant neoplasm of unspecified ovary: Secondary | ICD-10-CM | POA: Diagnosis not present

## 2023-04-02 DIAGNOSIS — I700 Atherosclerosis of aorta: Secondary | ICD-10-CM | POA: Diagnosis not present

## 2023-04-02 DIAGNOSIS — Z8543 Personal history of malignant neoplasm of ovary: Secondary | ICD-10-CM | POA: Diagnosis not present

## 2023-04-02 DIAGNOSIS — R911 Solitary pulmonary nodule: Secondary | ICD-10-CM | POA: Diagnosis not present

## 2023-05-07 ENCOUNTER — Inpatient Hospital Stay: Payer: Medicare Other | Attending: Oncology

## 2023-05-07 DIAGNOSIS — Z452 Encounter for adjustment and management of vascular access device: Secondary | ICD-10-CM | POA: Insufficient documentation

## 2023-05-07 DIAGNOSIS — C561 Malignant neoplasm of right ovary: Secondary | ICD-10-CM | POA: Insufficient documentation

## 2023-05-07 DIAGNOSIS — Z95828 Presence of other vascular implants and grafts: Secondary | ICD-10-CM

## 2023-05-07 MED ORDER — HEPARIN SOD (PORK) LOCK FLUSH 100 UNIT/ML IV SOLN
500.0000 [IU] | Freq: Once | INTRAVENOUS | Status: AC
  Administered 2023-05-07: 500 [IU] via INTRAVENOUS
  Filled 2023-05-07: qty 5

## 2023-05-07 MED ORDER — SODIUM CHLORIDE 0.9% FLUSH
10.0000 mL | Freq: Once | INTRAVENOUS | Status: AC
  Administered 2023-05-07: 10 mL via INTRAVENOUS
  Filled 2023-05-07: qty 10

## 2023-06-25 ENCOUNTER — Inpatient Hospital Stay: Payer: Medicare Other | Attending: Oncology

## 2023-06-25 ENCOUNTER — Inpatient Hospital Stay (HOSPITAL_BASED_OUTPATIENT_CLINIC_OR_DEPARTMENT_OTHER): Payer: Medicare Other | Admitting: Oncology

## 2023-06-25 ENCOUNTER — Encounter: Payer: Self-pay | Admitting: Oncology

## 2023-06-25 VITALS — BP 110/59 | HR 81 | Temp 98.4°F | Resp 18 | Wt 107.6 lb

## 2023-06-25 DIAGNOSIS — Z452 Encounter for adjustment and management of vascular access device: Secondary | ICD-10-CM | POA: Insufficient documentation

## 2023-06-25 DIAGNOSIS — Z08 Encounter for follow-up examination after completed treatment for malignant neoplasm: Secondary | ICD-10-CM | POA: Insufficient documentation

## 2023-06-25 DIAGNOSIS — D539 Nutritional anemia, unspecified: Secondary | ICD-10-CM | POA: Insufficient documentation

## 2023-06-25 DIAGNOSIS — I5033 Acute on chronic diastolic (congestive) heart failure: Secondary | ICD-10-CM | POA: Insufficient documentation

## 2023-06-25 DIAGNOSIS — I130 Hypertensive heart and chronic kidney disease with heart failure and stage 1 through stage 4 chronic kidney disease, or unspecified chronic kidney disease: Secondary | ICD-10-CM | POA: Diagnosis not present

## 2023-06-25 DIAGNOSIS — R1900 Intra-abdominal and pelvic swelling, mass and lump, unspecified site: Secondary | ICD-10-CM | POA: Diagnosis not present

## 2023-06-25 DIAGNOSIS — C569 Malignant neoplasm of unspecified ovary: Secondary | ICD-10-CM

## 2023-06-25 DIAGNOSIS — Z8543 Personal history of malignant neoplasm of ovary: Secondary | ICD-10-CM | POA: Diagnosis present

## 2023-06-25 LAB — CMP (CANCER CENTER ONLY)
ALT: 10 U/L (ref 0–44)
AST: 18 U/L (ref 15–41)
Albumin: 3.9 g/dL (ref 3.5–5.0)
Alkaline Phosphatase: 54 U/L (ref 38–126)
Anion gap: 5 (ref 5–15)
BUN: 31 mg/dL — ABNORMAL HIGH (ref 8–23)
CO2: 22 mmol/L (ref 22–32)
Calcium: 9.3 mg/dL (ref 8.9–10.3)
Chloride: 105 mmol/L (ref 98–111)
Creatinine: 1.42 mg/dL — ABNORMAL HIGH (ref 0.44–1.00)
GFR, Estimated: 40 mL/min — ABNORMAL LOW (ref >60)
Glucose, Bld: 123 mg/dL — ABNORMAL HIGH (ref 70–99)
Potassium: 4.3 mmol/L (ref 3.5–5.1)
Sodium: 132 mmol/L — ABNORMAL LOW (ref 135–145)
Total Bilirubin: 0.5 mg/dL (ref 0.0–1.2)
Total Protein: 7.1 g/dL (ref 6.5–8.1)

## 2023-06-25 LAB — CBC WITH DIFFERENTIAL (CANCER CENTER ONLY)
Abs Immature Granulocytes: 0.01 10*3/uL (ref 0.00–0.07)
Basophils Absolute: 0 10*3/uL (ref 0.0–0.1)
Basophils Relative: 1 %
Eosinophils Absolute: 0.1 10*3/uL (ref 0.0–0.5)
Eosinophils Relative: 2 %
HCT: 25.5 % — ABNORMAL LOW (ref 36.0–46.0)
Hemoglobin: 8.2 g/dL — ABNORMAL LOW (ref 12.0–15.0)
Immature Granulocytes: 0 %
Lymphocytes Relative: 23 %
Lymphs Abs: 0.9 10*3/uL (ref 0.7–4.0)
MCH: 32 pg (ref 26.0–34.0)
MCHC: 32.2 g/dL (ref 30.0–36.0)
MCV: 99.6 fL (ref 80.0–100.0)
Monocytes Absolute: 0.4 10*3/uL (ref 0.1–1.0)
Monocytes Relative: 11 %
Neutro Abs: 2.5 10*3/uL (ref 1.7–7.7)
Neutrophils Relative %: 63 %
Platelet Count: 186 10*3/uL (ref 150–400)
RBC: 2.56 MIL/uL — ABNORMAL LOW (ref 3.87–5.11)
RDW: 12.5 % (ref 11.5–15.5)
WBC Count: 4 10*3/uL (ref 4.0–10.5)
nRBC: 0 % (ref 0.0–0.2)

## 2023-06-25 LAB — IRON AND TIBC
Iron: 78 ug/dL (ref 28–170)
Saturation Ratios: 27 % (ref 10.4–31.8)
TIBC: 291 ug/dL (ref 250–450)
UIBC: 213 ug/dL

## 2023-06-25 LAB — RETIC PANEL
Immature Retic Fract: 4.6 % (ref 2.3–15.9)
RBC.: 2.52 MIL/uL — ABNORMAL LOW (ref 3.87–5.11)
Retic Count, Absolute: 28.7 10*3/uL (ref 19.0–186.0)
Retic Ct Pct: 1.1 % (ref 0.4–3.1)
Reticulocyte Hemoglobin: 33.7 pg (ref >27.9)

## 2023-06-25 LAB — FERRITIN: Ferritin: 414 ng/mL — ABNORMAL HIGH (ref 11–307)

## 2023-06-25 MED ORDER — HEPARIN SOD (PORK) LOCK FLUSH 100 UNIT/ML IV SOLN
500.0000 [IU] | Freq: Once | INTRAVENOUS | Status: AC
  Administered 2023-06-25: 500 [IU] via INTRAVENOUS
  Filled 2023-06-25: qty 5

## 2023-06-25 NOTE — Assessment & Plan Note (Signed)
Continue port flush Q6-8 weeks.

## 2023-06-25 NOTE — Progress Notes (Signed)
 Hematology/Oncology Progress note [phone] [phone]       CHIEF COMPLAINTS/PURPOSE OF VISIT Follow up for ovarian cancer.  ASSESSMENT & PLAN:   Cancer Staging  Malignant neoplasm of ovary (HCC) Staging form: Ovary, Fallopian Tube, and Primary Peritoneal Carcinoma, AJCC 8th Edition - Clinical stage from 11/22/2016: Stage IIIC (cT3c, cN1b, cM0) - Signed by Rickard Patience, MD on 11/22/2016   CHF (congestive heart failure) (HCC) Follow up with cardiology  Port-A-Cath in place Continue port flush Q6-8 weeks  Malignant neoplasm of ovary (HCC) 05/11/2022 CT chest abdomen pelvis w contrast showed stable disease.   Labs are reviewed and discussed with patient. 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 Folate receptor alpha positive   Recommend continue observation, and in the future, when she progresses, consider Mirvetuximab  Obtain repeat CT scan    Macrocytic anemia No significant improvement after holding chemotherapy.  No significant improvement after IV Venofer treatments and B12 injections. 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  continue observation. I will hold off erythropoietin replacement therapy due to its angiogenesis effects and ovarian cancer history.  She can get blood transfusion if symptomatic.  Lab Results  Component Value Date   HGB 8.2 (L) 06/25/2023   TIBC 291 06/25/2023   IRONPCTSAT 27 06/25/2023   FERRITIN 414 (H) 06/25/2023    Ferritin is at goal. Hb stable.     Orders Placed This Encounter  Procedures   CT CHEST ABDOMEN PELVIS WO CONTRAST    Standing Status:   Future    Expected Date:   07/02/2023    Expiration Date:   06/24/2024    Preferred imaging location?:   [facility]    If indicated for the ordered procedure, I authorize the administration of oral contrast media per Radiology protocol:   Yes    Does the patient have a contrast media/X-ray dye allergy?:   No   CMP (Cancer  Center only)    Standing Status:   Future    Expected Date:   09/25/2023    Expiration Date:   06/24/2024   CBC with Differential (Cancer Center Only)    Standing Status:   Future    Expected Date:   09/25/2023    Expiration Date:   06/24/2024   Iron and TIBC    Standing Status:   Future    Expected Date:   09/25/2023    Expiration Date:   06/24/2024   Ferritin    Standing Status:   Future    Expected Date:   09/25/2023    Expiration Date:   06/24/2024   CA 125    Standing Status:   Future    Expected Date:   09/25/2023    Expiration Date:   06/24/2024   Retic Panel    Standing Status:   Future    Expected Date:   09/25/2023    Expiration Date:   06/24/2024   Follow-up in 3 months.   All questions were answered. The patient knows to call the clinic with any problems, questions or concerns.  Rickard Patience, MD, PhD [facility] Hematology Oncology 06/25/2023      HISTORY OF PRESENTING ILLNESS: Katie Hill 73 y.o. female presents for follow up of management of recurrent  ovarian cancer. Oncology History  Malignant neoplasm of ovary (HCC)  09/20/2011 Imaging   CT chest abdomen pelvis w contrast 1. No significant interval changed compared with the previous exam.2. Stable appearance of soft  tissue attenuating lesion in the posterolateral right hemipelvis. This may represent a treated site of peritoneal disease.3. Unchanged appearance of borderline enlarged left periaortic and aortocaval retroperitoneal lymph nodes.4. Unchanged 6 mm left lower lobe lung nodule. 5. No new signs of metastatic disease within the chest, abdomen or pelvis. 6. Aortic Atherosclerosis (ICD10-I70.0) and Emphysema    11/20/2016 Initial Diagnosis   Malignant neoplasm of ovary   Pathology 11/16/2016 Surgical Pathology  CASE: [id number]  PATIENT: Katie Hill  Surgical Pathology Report   SPECIMEN SUBMITTED:  A. Retroperitoneal adenopathy, left  DIAGNOSIS:  A. LYMPH NODE, LEFT RETROPERITONEAL; CT-GUIDED  CORE BIOPSY:  - METASTATIC HIGH-GRADE SEROUS CARCINOMA.  Pathology 03/14/2017   DIAGNOSIS:  A. OMENTUM; OMENTECTOMY:  - NO TUMOR SEEN.  - ONE NEGATIVE LYMPH NODE (0/1).   B.  RIGHT FALLOPIAN TUBE AND OVARY; SALPINGO-OOPHORECTOMY:  - SMALL FOCI OF HIGH GRADE SEROUS CARCINOMA INVOLVING THE OVARY.  - MARKED THERAPY RELATED CHANGE.  - NO TUMOR SEEN IN THE FALLOPIAN TUBE.   C.  UTERUS, CERVIX, LEFT FALLOPIAN TUBE AND OVARY; HYSTERECTOMY AND LEFT  SALPINGO-OOPHORECTOMY:  - NABOTHIAN CYSTS.  - CYSTIC ATROPHY OF THE ENDOMETRIUM.  - SEROSAL ADHESIONS.  - UNREMARKABLE FALLOPIAN TUBE.  - OVARY SHOWING TREATMENT RELATED CHANGE.   D.  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    12/01/2016 -  Chemotherapy   Carboplatin and taxol x 4 neoadjuvant chemotherapy    03/14/2017 Surgery   Patient had debulking surgery on 03/14/2017. She had a laparoscopy with conversion to laparotomy, total hysterectomy, with bilateral salpingo oophorectomy, right aortic lymph node dissection, omentectomy. Pathology showed small foci of residual disease in ovary.   HRD positive, declined Olarparib maintenance trial    03/28/2017 -  Chemotherapy   Adjuvant carbo and taxol x3 cycles   02/28/2018 Progression   Local Recurrence. CA125 146 CT abdomen pelvis w contrast showed interval enlargement of an aortocaval lymph node which currently measures 1.4 cm in short axis (axial image 75 of series 2). This lymph node is in close proximity to the previously resected retroperitoneal lymphadenopathy and is very concerning for an additional nodal metastasis.    03/15/2018 -  Chemotherapy   03/15/2018-05/17/2018 Carboplatin and Taxol x 4.  CA125 decrease from 49.7 to 6 after 4 cycles of treatment.  05/17/2018-10/29/20   Olarparib 300mg  BID    11/05/2018 Imaging   MRI abdomenPelvis on 11/05/2018 showed Interval progression of, now measuring 2.7 x 2.3 cm and  concerning for progression of metastatic disease.retrocaval lymph node in the abdomen Olaparib was held for short period of time. Her CA125 trended down again.  Dr. Johnnette Litter recommending resume olaparib and continue monitor.   10/06/2020 Progression    CT chest abdomen pelvis with contrast showed new lymph nodes in the prevascular space of the upper anterior mediastinum most consistent with metastasis.  Single new right lower lobe pulmonary nodule is concerning for early pulmonary metastasis.  Interval enlargement of the left periaortic retroperitoneal lymph node.  Stable adjacent aorto caval adenopathy.    Genetic Testing   Genetic testing negative for 83 genes on [facility]'s Multi-Cancer panel (ALK, APC, ATM, AXIN2, BAP1, BARD1, BLM, BMPR1A, BRCA1, BRCA2, BRIP1, CASR, CDC73, CDH1, CDK4, CDKN1B, CDKN1C, CDKN2A, CEBPA, CHEK2, CTNNA1, DICER1, DIS3L2, EGFR, EPCAM, FH, FLCN, GATA2, GPC3, GREM1, HOXB13, HRAS, KIT, MAX, MEN1, MET, MITF, MLH1, MSH2, MSH3, MSH6, MUTYH, NBN, NF1, NF2, NTHL1, PALB2, PDGFRA, PHOX2B, PMS2, POLD1, POLE, POT1, PRKAR1A, PTCH1, PTEN, RAD50, RAD51C, RAD51D,  RB1, RECQL4, RET, RUNX1, SDHA, SDHAF2, SDHB, SDHC, SDHD, SMAD4, SMARCA4, SMARCB1, SMARCE1, STK11, SUFU, TERC, TERT, TMEM127, TP53, TSC1, TSC2, VHL, WRN, WT1).   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  Myraid testing negative for somatic BRACA1/2, positive for HRD.    11/03/2020 - 11/18/2021 Chemotherapy   Liposomal Doxorubicin + Bevacizumab q28d   11/03/20 -05/20/21 Doxil + Bevacizumab q28d  06/03/21 Doxil only due to proteinuria.  07/01/21-07/15/21  Resumed on Doxil + Bevacizumab 07/29/21 Doxil only due to proteinuria.  11/18/21 last dose of Doxil. Stopped due to CHF   10/14/2021 Echocardiogram    Left ventricular ejection fraction, by estimation, is 55 to 60%   12/16/2021 Imaging   [place] lower extremities 1. No evidence of deep venous thrombosis in either lower extremity. 2. 1.9 cm left popliteal  fossa Baker's cyst.   12/23/2021 Echocardiogram   LVEF 55%   12/26/2021 Imaging   CT chest abdomen pelvis w contrast   Chest Impression:   1. New RIGHT pleural effusion and new mild nodularity along the RIGHT oblique fissure. Recommend close attention on follow-up for potential pleural metastasis in the RIGHT hemithorax. 2. Stable LEFT lobe pulmonary nodule.3. No mediastinal lymphadenopathy.   Abdomen / Pelvis Impression:   1. Stable soft tissue lesion in the deep RIGHT pelvis. 2. Mass stable small periaortic lymph nodes. 3. No intraperitoneal free fluid the abdomen or pelvis.   01/20/2022 - 01/23/2022 Hospital Admission   Hospitalized due to acute on chronic diastolic CHF, hypertension urgency, She was seen by cardiology and nephrology. BNP was significantly elevated at 4500 She was discharged home with Losartan, Aldactone, Lasix, beta blocker and hydralazine.    02/21/2022 Imaging   CT chest abdomen pelvis w contrast Stable small soft tissue lesion in the right perirectal lesion. Stable mild retroperitoneal and retrocrural lymphadenopathy.Stable 6 mm left lower lobe pulmonary nodule.No new or progressive disease within the chest, abdomen, or pelvis   02/22/2022 Imaging   CT chest abdomen pelvis w contrast Stable small soft tissue lesion in the right perirectal lesion. Stable mild retroperitoneal and retrocrural lymphadenopathy. Stable 6 mm left lower lobe pulmonary nodule. No new or progressive disease within the chest, abdomen, or pelvis.   05/11/2022 Imaging   CT chest abdomen pelvis without contrast showed 1. Stable examination without convincing evidence of new or progressive disease in the chest, abdomen or pelvis on this noncontrast enhanced CT. 2. Stable small soft tissue right perirectal soft tissue lesion.3. Stable 6 mm left lower lobe pulmonary nodule. 4. Stable prominent left supraclavicular and retroperitoneal lymph nodes. 5. Large volume of formed stool in the  colon. Correlate for constipation. 6.  Aortic Atherosclerosis   07/21/2022 Bone Marrow Biopsy   bone marrow is slightly hypercellular at best with trilineage hematopoiesis.  There are generally mild dyspoietic changes present particularly in the erythroid and megakaryocytic cell lines.  No increase in blastic cells identified.  The overall changes are limited and not considered specific or diagnostic of a myeloid neoplasm and may be secondary in nature in this setting.  Nonetheless, correlation with cytogenetic and FISH studies is recommended.  There is no diagnostic evidence of a lymphoproliferative process, metastatic carcinoma or plasma cell neoplasm.  MDS FISH negative.  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    12/12/2022 Imaging   CT chest abdomen pelvis w contrast showed 1. Similar size of the soft tissue nodule adjacent to the  rectum in the posterior pelvis measures similar prior at 20 x 12 mm previously 17 x 13 mm and most recent remote imaging 19 x 11 mm. 2. Stable solid 6 mm left lower lobe pulmonary nodule. 3. No new or progressive findings in the chest, abdomen or pelvis. 4.  Aortic Atherosclerosis (ICD10-I70.0)   High grade ovarian cancer [facility])    INTERVAL HISTORY Katie Hill is a 73 y.o. female who has above history reviewed by me today presents for follow up visit for recurrent Ovarian cancer Patient reports no new problems since last visit.  She denies feeling very tired or fatigued. CHF is controlled well.  Denies weight loss, fever, chills, fatigue, night sweats.   . Review of Systems  Constitutional:  Positive for fatigue. Negative for appetite change, chills and fever.  HENT:   Negative for hearing loss and voice change.   Eyes:  Negative for eye problems.  Respiratory:  Negative for chest tightness and cough.   Cardiovascular:  Negative for chest pain and leg swelling.   Gastrointestinal:  Negative for abdominal distention, abdominal pain and blood in stool.  Endocrine: Negative for hot flashes.  Genitourinary:  Negative for difficulty urinating and frequency.   Musculoskeletal:  Negative for arthralgias.  Skin:  Negative for itching and rash.  Neurological:  Negative for extremity weakness.  Hematological:  Negative for adenopathy.  Psychiatric/Behavioral:  Negative for confusion.      [name] MEDICAL HISTORY: Past Medical History:  Diagnosis Date   (HFpEF) heart failure with preserved ejection fraction (HCC)    a. 12/2021 Echo: EF 55%, no rwma, nl RV fxn, mild MR, mild-mod TR. Triv AI.   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   Pelvic mass in female     SURGICAL HISTORY: Past Surgical History:  Procedure Laterality Date   APPENDECTOMY     IR BONE MARROW BIOPSY & ASPIRATION  07/21/2022   LAPAROSCOPY N/A 03/14/2017   Procedure: LAPAROSCOPY OPERATIVE;  Surgeon: Leida Lauth, MD;  Location: [facility];  Service: Gynecology;  Laterality: N/A;   LAPAROTOMY N/A 03/14/2017   Procedure: LAPAROTOMY;  Surgeon: Leida Lauth, MD;  Location: [facility];  Service: Gynecology;  Laterality: N/A;   LYMPH NODE DISSECTION N/A 03/14/2017   Procedure: LYMPH NODE DISSECTION;  Surgeon: Leida Lauth, MD;  Location: [facility];  Service: Gynecology;  Laterality: N/A;   OMENTECTOMY N/A 03/14/2017   Procedure: OMENTECTOMY;  Surgeon: Leida Lauth, MD;  Location: [facility];  Service: Gynecology;  Laterality: N/A;   PORTA CATH INSERTION N/A 11/27/2016   Procedure: Shelda Pal Cath Insertion;  Surgeon: Annice Needy, MD;  Location: [facility];  Service: Cardiovascular;  Laterality: N/A;    SOCIAL HISTORY: Social History   Tobacco Use    Smoking status: Never   Smokeless tobacco: Never  Vaping Use   Vaping status: Never Used  Substance Use Topics   Alcohol use: Not Currently   Drug use: No     FAMILY HISTORY Family History  Problem Relation Age of Onset   Throat cancer Cousin    Throat cancer Cousin    Leukemia Cousin     ALLERGIES:  is allergic to omeprazole.  MEDICATIONS:  Current Outpatient Medications  Medication Sig Dispense Refill  aspirin 81 MG chewable tablet Chew 1 tablet (81 mg total) by mouth daily. 30 tablet 0   cholecalciferol (VITAMIN D3) 25 MCG (1000 UNIT) tablet Take 2,000 Units by mouth daily.     Cyanocobalamin (B-12) 1000 MCG CAPS Take 1,000 mg by mouth daily.     empagliflozin (JARDIANCE) 10 MG TABS tablet Take 1 tablet (10 mg total) by mouth daily before breakfast. 90 tablet 3   feeding supplement (ENSURE ENLIVE / ENSURE PLUS) LIQD Take 237 mLs by mouth 3 (three) times daily between meals. 14220 mL 0   Ferrous Sulfate (IRON) 325 (65 Fe) MG TABS Take 325 mg by mouth daily.     furosemide (LASIX) 40 MG tablet TAKE ONE TABLET BY MOUTH EVERY DAY (CAN TAKE EXTRA TABLET IN AFTERNOON IF GAIN 3 POUNDS IN A DAY OR 5 POUNDS IN A WEEK) 90 tablet 2   hydrALAZINE (APRESOLINE) 100 MG tablet Take 1 tablet (100 mg total) by mouth 3 (three) times daily. 30 tablet 0   levothyroxine (SYNTHROID) 100 MCG tablet Take 1 tablet (100 mcg total) by mouth daily. 90 tablet 3   lidocaine-prilocaine (EMLA) cream Apply 1 application topically as needed. Apply small amount to port site at least 1 hour prior to it being accessed, cover with plastic wrap 30 g 1   sacubitril-valsartan (ENTRESTO) 49-51 MG Take 1 tablet by mouth 2 (two) times daily. 180 tablet 3   spironolactone (ALDACTONE) 25 MG tablet Take 1 tablet (25 mg total) by mouth daily. (Patient taking differently: Take 25 mg by mouth every Monday, Wednesday, and Friday at 6 PM.) 90 tablet 3   ondansetron (ZOFRAN) 4 MG tablet Take 1 tablet (4 mg total) by mouth every 8  (eight) hours as needed for nausea or vomiting. (Patient not taking: Reported on 06/25/2023) 60 tablet 3   No current facility-administered medications for this visit.   Facility-Administered Medications Ordered in Other Visits  Medication Dose Route Frequency Provider Last Rate Last Admin   pegfilgrastim (NEULASTA ONPRO KIT) 6 MG/0.6ML injection             PHYSICAL EXAMINATION:  ECOG PERFORMANCE STATUS: 0 - Asymptomatic Vitals:   06/25/23 1005  BP: (!) 110/59  Pulse: 81  Resp: 18  Temp: 98.4 F (36.9 C)  SpO2: 100%     Filed Weights   06/25/23 1005  Weight: 107 lb 9.6 oz (48.8 kg)      Physical Exam Constitutional:      General: She is not in acute distress.    Appearance: She is not diaphoretic.     Comments: Thin built, she walks independantly  HENT:     Head: Normocephalic.  Eyes:     General: No scleral icterus.    Pupils: Pupils are equal, round, and reactive to light.  Neck:     Vascular: No JVD.  Cardiovascular:     Rate and Rhythm: Normal rate.  Pulmonary:     Effort: Pulmonary effort is normal. No respiratory distress.     Breath sounds: Normal breath sounds.  Abdominal:     General: Bowel sounds are normal. There is no distension.     Palpations: Abdomen is soft.  Musculoskeletal:        General: Normal range of motion.     Cervical back: Normal range of motion and neck supple.     Right lower leg: No edema.     Left lower leg: No edema.  Lymphadenopathy:     Cervical: No cervical adenopathy.  Skin:    General: Skin is warm and dry.     Findings: No rash.     Comments: Right anterior medi port +   Neurological:     Mental Status: She is alert and oriented to person, place, and time. Mental status is at baseline.     Motor: No abnormal muscle tone.  Psychiatric:        Mood and Affect: Mood and affect normal.        LABORATORY DATA: I have personally reviewed the data as listed:     Latest Ref Rng & Units 06/25/2023    9:53 AM  03/26/2023    9:48 AM 02/05/2023    9:13 AM  CBC  WBC 4.0 - 10.5 K/uL 4.0  3.7  3.2   Hemoglobin 12.0 - 15.0 g/dL 8.2  8.0  7.7   Hematocrit 36.0 - 46.0 % 25.5  24.1  23.6   Platelets 150 - 400 K/uL 186  130  127       Latest Ref Rng & Units 06/25/2023    9:53 AM 03/26/2023    9:48 AM 12/25/2022    9:27 AM  CMP  Glucose 70 - 99 mg/dL 253  90  94   BUN 8 - 23 mg/dL 31  32  35   Creatinine 0.44 - 1.00 mg/dL 6.64  4.03  4.74   Sodium 135 - 145 mmol/L 132  131  131   Potassium 3.5 - 5.1 mmol/L 4.3  4.9  4.4   Chloride 98 - 111 mmol/L 105  102  105   CO2 22 - 32 mmol/L 22  21  19    Calcium 8.9 - 10.3 mg/dL 9.3  9.6  9.6   Total Protein 6.5 - 8.1 g/dL 7.1  7.3  7.3   Total Bilirubin 0.0 - 1.2 mg/dL 0.5  0.5  0.4   Alkaline Phos 38 - 126 U/L 54  65  65   AST 15 - 41 U/L 18  13  15    ALT 0 - 44 U/L 10  8  9       RADIOGRAPHIC STUDIES: I have personally reviewed the radiological images as listed and agreed with the findings in the report. CT CHEST ABDOMEN PELVIS WO CONTRAST Result Date: 04/04/2023 CLINICAL DATA:  History of ovarian cancer, monitor. * Tracking Code: BO * EXAM: CT CHEST, ABDOMEN AND PELVIS WITHOUT CONTRAST TECHNIQUE: Multidetector CT imaging of the chest, abdomen and pelvis was performed following the standard protocol without IV contrast. RADIATION DOSE REDUCTION: This exam was performed according to the departmental dose-optimization program which includes automated exposure control, adjustment of the mA and/or kV according to patient size and/or use of iterative reconstruction technique. COMPARISON:  Multiple priors including most recent CT December 12, 2022 FINDINGS: CT CHEST FINDINGS Cardiovascular: Right chest Port-A-Cath with tip at the superior cavoatrial junction. Aortic atherosclerosis. Normal size heart. No significant pericardial effusion/thickening. Mediastinum/Nodes: No suspicious thyroid nodule. No pathologically enlarged mediastinal, hilar or axillary lymph nodes  noting limited evaluation of the hilar structures on noncontrast enhanced examination. Mild symmetric distal esophageal wall thickening. Lungs/Pleura: Stable 6 mm left lower lobe pulmonary nodule on image 109/4. No new suspicious pulmonary nodules or masses. Biapical pleuroparenchymal scarring. No pleural effusion. No pneumothorax. Scattered atelectasis/scarring. Musculoskeletal: No aggressive lytic or blastic lesion of bone. Multilevel degenerative changes spine. CT ABDOMEN PELVIS FINDINGS Hepatobiliary: Unremarkable noncontrast enhanced appearance of the hepatic parenchyma. Gallbladder is unremarkable. No biliary ductal dilation. Pancreas: No pancreatic ductal dilation  or evidence of acute inflammation. Spleen: No splenomegaly. Adrenals/Urinary Tract: No suspicious adrenal mass/nodule. No hydronephrosis. No renal, ureteral or bladder calculi. Urinary bladder is unremarkable for degree of distension. Stomach/Bowel: Radiopaque enteric contrast material traverses the hepatic flexure. Stomach is unremarkable for degree of distension. No pathologic dilation of small or large bowel. Moderate volume of formed stool in the colon. No evidence of acute bowel inflammation. Vascular/Lymphatic: Aortic atherosclerosis. Normal caliber abdominal aorta. Smooth IVC contours. Retroperitoneal surgical clips. No pathologically enlarged abdominal or pelvic lymph nodes noting limited evaluation given lack of intravenous contrast material and paucity of peritoneal fat. Reproductive: Uterus is surgically absent without new suspicious nodularity along the vaginal cuff. No discrete adnexal mass. Other: Soft tissue nodule in the pelvis measures 21 x 10 mm on image 112/2 previously 20 x 12 mm. No significant abdominopelvic ascites. Musculoskeletal: No aggressive lytic or blastic lesion of bone. IMPRESSION: 1. Stable soft tissue nodule in the pelvis. 2. Stable 6 mm left lower lobe pulmonary nodule. 3. No new or progressive findings identified  in the chest, abdomen or pelvis on noncontrast enhanced CT. 4. Mild symmetric distal esophageal wall thickening, nonspecific but can be seen in the setting of esophagitis. 5. Moderate volume of formed stool in the colon. Correlate for constipation. 6.  Aortic Atherosclerosis (ICD10-I70.0). Electronically Signed   By: Maudry Mayhew M.D.   On: 04/04/2023 07:10

## 2023-06-25 NOTE — Assessment & Plan Note (Signed)
 Follow up with cardiology

## 2023-06-25 NOTE — Assessment & Plan Note (Signed)
 No significant improvement after holding chemotherapy.  No significant improvement after IV Venofer treatments and B12 injections. 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  continue observation. I will hold off erythropoietin replacement therapy due to its angiogenesis effects and ovarian cancer history.  She can get blood transfusion if symptomatic.  Lab Results  Component Value Date   HGB 8.2 (L) 06/25/2023   TIBC 291 06/25/2023   IRONPCTSAT 27 06/25/2023   FERRITIN 414 (H) 06/25/2023    Ferritin is at goal. Hb stable.

## 2023-06-25 NOTE — Assessment & Plan Note (Signed)
 05/11/2022 CT chest abdomen pelvis w contrast showed stable disease.   Labs are reviewed and discussed with patient. 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 Folate receptor alpha positive   Recommend continue observation, and in the future, when she progresses, consider Mirvetuximab  Obtain repeat CT scan

## 2023-06-26 LAB — CA 125: Cancer Antigen (CA) 125: 9.7 U/mL (ref 0.0–38.1)

## 2023-07-02 ENCOUNTER — Ambulatory Visit
Admission: RE | Admit: 2023-07-02 | Discharge: 2023-07-02 | Disposition: A | Discharge status: Home / Self Care | Source: Ambulatory Visit | Attending: Oncology | Admitting: Oncology

## 2023-07-02 DIAGNOSIS — R1900 Intra-abdominal and pelvic swelling, mass and lump, unspecified site: Secondary | ICD-10-CM | POA: Diagnosis present

## 2023-07-02 DIAGNOSIS — C569 Malignant neoplasm of unspecified ovary: Secondary | ICD-10-CM | POA: Insufficient documentation

## 2023-08-14 ENCOUNTER — Inpatient Hospital Stay: Attending: Oncology

## 2023-08-14 DIAGNOSIS — Z8543 Personal history of malignant neoplasm of ovary: Secondary | ICD-10-CM | POA: Insufficient documentation

## 2023-08-14 DIAGNOSIS — Z95828 Presence of other vascular implants and grafts: Secondary | ICD-10-CM

## 2023-08-14 DIAGNOSIS — Z452 Encounter for adjustment and management of vascular access device: Secondary | ICD-10-CM | POA: Diagnosis not present

## 2023-08-14 MED ORDER — HEPARIN SOD (PORK) LOCK FLUSH 100 UNIT/ML IV SOLN
500.0000 [IU] | Freq: Once | INTRAVENOUS | Status: AC
Start: 2023-08-14 — End: 2023-08-14
  Administered 2023-08-14: 500 [IU] via INTRAVENOUS
  Filled 2023-08-14: qty 5

## 2023-08-14 MED ORDER — SODIUM CHLORIDE 0.9% FLUSH
10.0000 mL | INTRAVENOUS | Status: DC | PRN
  Administered 2023-08-14: 10 mL via INTRAVENOUS
  Filled 2023-08-14: qty 10

## 2023-08-14 NOTE — Patient Instructions (Signed)

## 2023-08-23 ENCOUNTER — Ambulatory Visit: Attending: Cardiology | Admitting: Cardiology

## 2023-08-23 ENCOUNTER — Encounter: Payer: Self-pay | Admitting: Oncology

## 2023-08-23 ENCOUNTER — Encounter: Payer: Self-pay | Admitting: Cardiology

## 2023-08-23 VITALS — BP 95/64 | HR 69 | Ht 68.0 in | Wt 105.8 lb

## 2023-08-23 DIAGNOSIS — I10 Essential (primary) hypertension: Secondary | ICD-10-CM | POA: Insufficient documentation

## 2023-08-23 DIAGNOSIS — I5032 Chronic diastolic (congestive) heart failure: Secondary | ICD-10-CM | POA: Diagnosis present

## 2023-08-23 MED ORDER — SPIRONOLACTONE 25 MG PO TABS: 25.0000 mg | ORAL_TABLET | Freq: Every day | ORAL | 3 refills | Status: DC

## 2023-08-23 NOTE — Progress Notes (Signed)
 Cardiology Office Note:    Date:  08/23/2023   ID:  Katie Hill, DOB 1950/05/27, MRN [id number]  PCP:  Little Riff, MD   [facility] Providers Cardiologist:  Constancia Delton, MD     Referring MD: Little Riff, MD   No chief complaint on file.   History of Present Illness:    Katie Hill is a 73 y.o. female with a hx of hypertension, HFpEF, ovarian cancer, who presents for follow-up.    Doing okay, denies chest pain or shortness of breath.  Blood pressures at home ranges in the 90s to low 100s.  Denies dizziness or syncope.  Compliant with medications as prescribed.  Denies edema.  Prior notes/studies Echocardiogram 12/2021 EF 55% History of bradycardia with beta-blocker.  Past Medical History:  Diagnosis Date   (HFpEF) heart failure with preserved ejection fraction (HCC)    a. 12/2021 Echo: EF 55%, no rwma, nl RV fxn, mild MR, mild-mod TR. Triv AI.   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   Pelvic mass in female     Past Surgical History:  Procedure Laterality Date   APPENDECTOMY     IR BONE MARROW BIOPSY & ASPIRATION  07/21/2022   LAPAROSCOPY N/A 03/14/2017   Procedure: LAPAROSCOPY OPERATIVE;  Surgeon: Hermine Loots, MD;  Location: [facility];  Service: Gynecology;  Laterality: N/A;   LAPAROTOMY N/A 03/14/2017   Procedure: LAPAROTOMY;  Surgeon: Hermine Loots, MD;  Location: [facility];  Service: Gynecology;  Laterality: N/A;   LYMPH NODE DISSECTION N/A 03/14/2017   Procedure: LYMPH NODE DISSECTION;  Surgeon: Hermine Loots, MD;  Location: [facility];  Service: Gynecology;  Laterality: N/A;   OMENTECTOMY N/A 03/14/2017   Procedure: OMENTECTOMY;  Surgeon: Hermine Loots, MD;  Location: [facility];  Service: Gynecology;   Laterality: N/A;   PORTA CATH INSERTION N/A 11/27/2016   Procedure: Melville Stade Cath Insertion;  Surgeon: Celso College, MD;  Location: [facility];  Service: Cardiovascular;  Laterality: N/A;    Current Medications: Current Meds  Medication Sig   aspirin  81 MG chewable tablet Chew 1 tablet (81 mg total) by mouth daily.   cholecalciferol (VITAMIN D3) 25 MCG (1000 UNIT) tablet Take 2,000 Units by mouth daily.   Cyanocobalamin  (B-12) 1000 MCG CAPS Take 1,000 mg by mouth daily.   empagliflozin  (JARDIANCE ) 10 MG TABS tablet Take 1 tablet (10 mg total) by mouth daily before breakfast.   feeding supplement (ENSURE ENLIVE / ENSURE PLUS) LIQD Take 237 mLs by mouth 3 (three) times daily between meals.   Ferrous Sulfate (IRON ) 325 (65 Fe) MG TABS Take 325 mg by mouth daily.   furosemide  (LASIX ) 40 MG tablet TAKE ONE TABLET BY MOUTH EVERY DAY (CAN TAKE EXTRA TABLET IN AFTERNOON IF GAIN 3 POUNDS IN A DAY OR 5 POUNDS IN A WEEK)   levothyroxine  (SYNTHROID ) 100 MCG tablet Take 1 tablet (100 mcg total) by mouth daily.   lidocaine -prilocaine  (EMLA ) cream Apply 1 application topically as needed. Apply small amount to port site at least 1 hour prior to it being accessed, cover with plastic wrap   ondansetron  (ZOFRAN ) 4 MG tablet Take 1 tablet (4 mg total) by mouth every 8 (eight) hours as needed for  nausea or vomiting.   sacubitril -valsartan  (ENTRESTO ) 49-51 MG Take 1 tablet by mouth 2 (two) times daily.   [DISCONTINUED] hydrALAZINE  (APRESOLINE ) 100 MG tablet Take 1 tablet (100 mg total) by mouth 3 (three) times daily.   [DISCONTINUED] spironolactone  (ALDACTONE ) 25 MG tablet Take 1 tablet (25 mg total) by mouth daily. (Patient taking differently: Take 25 mg by mouth every Monday, Wednesday, and Friday at 6 PM.)     Allergies:   Omeprazole    Social History   Socioeconomic History   Marital status: Married    Spouse name: Not on file   Number of children: Not on file   Years of education: Not on file    Highest education level: Not on file  Occupational History   Not on file  Tobacco Use   Smoking status: Never   Smokeless tobacco: Never  Vaping Use   Vaping status: Never Used  Substance and Sexual Activity   Alcohol  use: Not Currently   Drug use: No   Sexual activity: Yes    Birth control/protection: Post-menopausal  Other Topics Concern   Not on file  Social History Narrative   Lives locally with husband.  Was fairly active.   Social Drivers of Corporate investment banker Strain: Low Risk  (04/21/2021)   Overall Financial Resource Strain (CARDIA)    Difficulty of Paying Living Expenses: Not hard at all  Food Insecurity: No Food Insecurity (01/22/2022)   Hunger Vital Sign    Worried About Running Out of Food in the Last Year: Never true    Ran Out of Food in the Last Year: Never true  Transportation Needs: No Transportation Needs (01/22/2022)   PRAPARE - Administrator, Civil Service (Medical): No    Lack of Transportation (Non-Medical): No  Physical Activity: Insufficiently Active (04/21/2021)   Exercise Vital Sign    Days of Exercise per Week: 6 days    Minutes of Exercise per Session: 20 min  Stress: No Stress Concern Present (04/21/2021)   [employer] of Occupational Health - Occupational Stress Questionnaire    Feeling of Stress : Not at all  Social Connections: Moderately Isolated (04/21/2021)   Social Connection and Isolation Panel [NHANES]    Frequency of Communication with Friends and Family: Three times a week    Frequency of Social Gatherings with Friends and Family: Twice a week    Attends Religious Services: Never    Database administrator or Organizations: No    Attends Engineer, structural: Never    Marital Status: Married     Family History: The patient's family history includes Leukemia in her cousin; Throat cancer in her cousin and cousin.  ROS:   Please see the history of present illness.     All other systems reviewed  and are negative.  EKGs/Labs/Other Studies Reviewed:    The following studies were reviewed today:   EKG Interpretation Date/Time:  Thursday Aug 23 2023 11:24:15 EDT Ventricular Rate:  69 PR Interval:  146 QRS Duration:  80 QT Interval:  374 QTC Calculation: 400 R Axis:   76  Text Interpretation: Normal sinus rhythm with sinus arrhythmia Low voltage QRS Confirmed by Constancia Delton ([id number]) on 08/23/2023 11:27:45 AM    Recent Labs: 06/25/2023: ALT 10; BUN 31; Creatinine 1.42; Hemoglobin 8.2; Platelet Count 186; Potassium 4.3; Sodium 132  Recent Lipid Panel No results found for: "CHOL", "TRIG", "HDL", "CHOLHDL", "VLDL", "LDLCALC", "LDLDIRECT"   Risk Assessment/Calculations:  Physical Exam:    VS:  BP 95/64 (BP Location: Left Arm, Patient Position: Sitting, Cuff Size: Normal)   Pulse 69   Ht 5\' 8"  (1.727 m)   Wt 105 lb 12.8 oz (48 kg)   SpO2 99%   BMI 16.09 kg/m     Wt Readings from Last 3 Encounters:  08/23/23 105 lb 12.8 oz (48 kg)  06/25/23 107 lb 9.6 oz (48.8 kg)  03/26/23 107 lb 8 oz (48.8 kg)     GEN:  Well nourished, well developed in no acute distress HEENT: Normal NECK: No JVD; No carotid bruits CARDIAC: RRR, no murmurs, rubs, gallops RESPIRATORY:  Clear to auscultation without rales, wheezing or rhonchi  ABDOMEN: Soft, non-tender, non-distended MUSCULOSKELETAL: no edema; No deformity  SKIN: Warm and dry NEUROLOGIC:  Alert and oriented x 3 PSYCHIATRIC:  Normal affect   ASSESSMENT:    1. Primary hypertension   2. Chronic diastolic heart failure (HCC)    PLAN:    In order of problems listed above:  Hypertension, BP controlled/low normal, stop hydralazine .  Increase Aldactone  to 25 mg daily, continue Entresto  49-51 mg twice daily, Aldactone  25 mg daily. HFpEF, appears euvolemic.  Continue Lasix , Aldactone .  Echo 9/23 EF 55%.  describes NYHA class II symptoms.  Follow-up in 3 months.  For BP check.     Medication Adjustments/Labs and  Tests Ordered: Current medicines are reviewed at length with the patient today.  Concerns regarding medicines are outlined above.  Orders Placed This Encounter  Procedures   EKG 12-Lead   Meds ordered this encounter  Medications   spironolactone  (ALDACTONE ) 25 MG tablet    Sig: Take 1 tablet (25 mg total) by mouth daily.    Dispense:  90 tablet    Refill:  3    Patient Instructions  Medication Instructions:  Your physician recommends the following medication changes.  STOP TAKING: Hydralazine   INCREASE: Spironolactone  25 mg daily  *If you need a refill on your cardiac medications before your next appointment, please call your pharmacy*  Lab Work: None ordered at this time   Follow-Up: At [facility], you and your health needs are our priority.  As part of our continuing mission to provide you with exceptional heart care, our providers are all part of one team.  This team includes your primary Cardiologist (physician) and Advanced Practice Providers or APPs (Physician Assistants and Nurse Practitioners) who all work together to provide you with the care you need, when you need it.  Your next appointment:   3 month(s)  Provider:   You may see Constancia Delton, MD or one of the following Advanced Practice Providers on your designated Care Team:   Laneta Pintos, NP Gildardo Labrador, PA-C Varney Gentleman, PA-C Cadence [place], PA-C Ronald Cockayne, NP Morey Ar, NP    We recommend signing up for the patient portal called "MyChart".  Sign up information is provided on this After Visit Summary.  MyChart is used to connect with patients for Virtual Visits (Telemedicine).  Patients are able to view lab/test results, encounter notes, upcoming appointments, etc.  Non-urgent messages can be sent to your provider as well.   To learn more about what you can do with MyChart, go to [email or web address].    Signed, Constancia Delton, MD  08/23/2023 12:10 PM    [facility]

## 2023-08-23 NOTE — Patient Instructions (Signed)
 Medication Instructions:  Your physician recommends the following medication changes.  STOP TAKING: Hydralazine   INCREASE: Spironolactone  25 mg daily  *If you need a refill on your cardiac medications before your next appointment, please call your pharmacy*  Lab Work: None ordered at this time   Follow-Up: At [facility], you and your health needs are our priority.  As part of our continuing mission to provide you with exceptional heart care, our providers are all part of one team.  This team includes your primary Cardiologist (physician) and Advanced Practice Providers or APPs (Physician Assistants and Nurse Practitioners) who all work together to provide you with the care you need, when you need it.  Your next appointment:   3 month(s)  Provider:   You may see Constancia Delton, MD or one of the following Advanced Practice Providers on your designated Care Team:   Laneta Pintos, NP Gildardo Labrador, PA-C Varney Gentleman, PA-C Cadence [place], PA-C Ronald Cockayne, NP Morey Ar, NP    We recommend signing up for the patient portal called "MyChart".  Sign up information is provided on this After Visit Summary.  MyChart is used to connect with patients for Virtual Visits (Telemedicine).  Patients are able to view lab/test results, encounter notes, upcoming appointments, etc.  Non-urgent messages can be sent to your provider as well.   To learn more about what you can do with MyChart, go to [email or web address].

## 2023-09-26 ENCOUNTER — Inpatient Hospital Stay: Attending: Oncology

## 2023-09-26 ENCOUNTER — Inpatient Hospital Stay (HOSPITAL_BASED_OUTPATIENT_CLINIC_OR_DEPARTMENT_OTHER): Admitting: Oncology

## 2023-09-26 ENCOUNTER — Encounter: Payer: Self-pay | Admitting: Oncology

## 2023-09-26 VITALS — BP 131/78 | HR 59 | Temp 97.7°F | Resp 15 | Wt 106.9 lb

## 2023-09-26 DIAGNOSIS — I5031 Acute diastolic (congestive) heart failure: Secondary | ICD-10-CM

## 2023-09-26 DIAGNOSIS — C563 Malignant neoplasm of bilateral ovaries: Secondary | ICD-10-CM

## 2023-09-26 DIAGNOSIS — Z95828 Presence of other vascular implants and grafts: Secondary | ICD-10-CM

## 2023-09-26 DIAGNOSIS — D539 Nutritional anemia, unspecified: Secondary | ICD-10-CM

## 2023-09-26 DIAGNOSIS — Z8543 Personal history of malignant neoplasm of ovary: Secondary | ICD-10-CM | POA: Insufficient documentation

## 2023-09-26 DIAGNOSIS — Z452 Encounter for adjustment and management of vascular access device: Secondary | ICD-10-CM | POA: Insufficient documentation

## 2023-09-26 DIAGNOSIS — N189 Chronic kidney disease, unspecified: Secondary | ICD-10-CM | POA: Insufficient documentation

## 2023-09-26 DIAGNOSIS — Z08 Encounter for follow-up examination after completed treatment for malignant neoplasm: Secondary | ICD-10-CM | POA: Insufficient documentation

## 2023-09-26 DIAGNOSIS — C569 Malignant neoplasm of unspecified ovary: Secondary | ICD-10-CM

## 2023-09-26 DIAGNOSIS — T63441A Toxic effect of venom of bees, accidental (unintentional), initial encounter: Secondary | ICD-10-CM | POA: Insufficient documentation

## 2023-09-26 DIAGNOSIS — I5033 Acute on chronic diastolic (congestive) heart failure: Secondary | ICD-10-CM | POA: Diagnosis not present

## 2023-09-26 DIAGNOSIS — I130 Hypertensive heart and chronic kidney disease with heart failure and stage 1 through stage 4 chronic kidney disease, or unspecified chronic kidney disease: Secondary | ICD-10-CM | POA: Diagnosis not present

## 2023-09-26 LAB — CBC WITH DIFFERENTIAL (CANCER CENTER ONLY)
Abs Immature Granulocytes: 0 10*3/uL (ref 0.00–0.07)
Basophils Absolute: 0 10*3/uL (ref 0.0–0.1)
Basophils Relative: 0 %
Eosinophils Absolute: 0.1 10*3/uL (ref 0.0–0.5)
Eosinophils Relative: 2 %
HCT: 26.8 % — ABNORMAL LOW (ref 36.0–46.0)
Hemoglobin: 8.9 g/dL — ABNORMAL LOW (ref 12.0–15.0)
Immature Granulocytes: 0 %
Lymphocytes Relative: 19 %
Lymphs Abs: 0.8 10*3/uL (ref 0.7–4.0)
MCH: 32.5 pg (ref 26.0–34.0)
MCHC: 33.2 g/dL (ref 30.0–36.0)
MCV: 97.8 fL (ref 80.0–100.0)
Monocytes Absolute: 0.4 10*3/uL (ref 0.1–1.0)
Monocytes Relative: 10 %
Neutro Abs: 2.8 10*3/uL (ref 1.7–7.7)
Neutrophils Relative %: 69 %
Platelet Count: 150 10*3/uL (ref 150–400)
RBC: 2.74 MIL/uL — ABNORMAL LOW (ref 3.87–5.11)
RDW: 12.2 % (ref 11.5–15.5)
WBC Count: 4.1 10*3/uL (ref 4.0–10.5)
nRBC: 0 % (ref 0.0–0.2)

## 2023-09-26 LAB — CMP (CANCER CENTER ONLY)
ALT: 10 U/L (ref 0–44)
AST: 17 U/L (ref 15–41)
Albumin: 4.1 g/dL (ref 3.5–5.0)
Alkaline Phosphatase: 77 U/L (ref 38–126)
Anion gap: 7 (ref 5–15)
BUN: 35 mg/dL — ABNORMAL HIGH (ref 8–23)
CO2: 21 mmol/L — ABNORMAL LOW (ref 22–32)
Calcium: 9.5 mg/dL (ref 8.9–10.3)
Chloride: 103 mmol/L (ref 98–111)
Creatinine: 1.4 mg/dL — ABNORMAL HIGH (ref 0.44–1.00)
GFR, Estimated: 40 mL/min — ABNORMAL LOW (ref >60)
Glucose, Bld: 85 mg/dL (ref 70–99)
Potassium: 4.6 mmol/L (ref 3.5–5.1)
Sodium: 131 mmol/L — ABNORMAL LOW (ref 135–145)
Total Bilirubin: 0.9 mg/dL (ref 0.0–1.2)
Total Protein: 7.1 g/dL (ref 6.5–8.1)

## 2023-09-26 LAB — IRON AND TIBC
Iron: 66 ug/dL (ref 28–170)
Saturation Ratios: 21 % (ref 10.4–31.8)
TIBC: 322 ug/dL (ref 250–450)
UIBC: 256 ug/dL

## 2023-09-26 LAB — RETIC PANEL
Immature Retic Fract: 2.3 % (ref 2.3–15.9)
RBC.: 2.75 MIL/uL — ABNORMAL LOW (ref 3.87–5.11)
Retic Count, Absolute: 21.2 10*3/uL (ref 19.0–186.0)
Retic Ct Pct: 0.8 % (ref 0.4–3.1)
Reticulocyte Hemoglobin: 32.9 pg (ref >27.9)

## 2023-09-26 LAB — FERRITIN: Ferritin: 326 ng/mL — ABNORMAL HIGH (ref 11–307)

## 2023-09-26 MED ORDER — SODIUM CHLORIDE 0.9% FLUSH
10.0000 mL | INTRAVENOUS | Status: DC | PRN
  Administered 2023-09-26: 10 mL via INTRAVENOUS
  Filled 2023-09-26: qty 10

## 2023-09-26 MED ORDER — HEPARIN SOD (PORK) LOCK FLUSH 100 UNIT/ML IV SOLN
500.0000 [IU] | Freq: Once | INTRAVENOUS | Status: AC
  Administered 2023-09-26: 500 [IU] via INTRAVENOUS
  Filled 2023-09-26: qty 5

## 2023-09-26 NOTE — Assessment & Plan Note (Signed)
 Follow up with cardiology

## 2023-09-26 NOTE — Assessment & Plan Note (Signed)
 Recommend topical steroid and benadryl  cream for symptom control.

## 2023-09-26 NOTE — Assessment & Plan Note (Signed)
 05/11/2022 CT chest abdomen pelvis w contrast showed stable disease.   Labs are reviewed and discussed with patient. 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 Folate receptor alpha positive   Recommend continue observation, and in the future, when she progresses, consider Mirvetuximab  Obtain repeat CT scan in Sept 2025

## 2023-09-26 NOTE — Assessment & Plan Note (Signed)
Continue port flush Q6-8 weeks.

## 2023-09-26 NOTE — Assessment & Plan Note (Signed)
 No significant improvement after holding chemotherapy.  No significant improvement after IV Venofer  treatments and B12 injections. 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  continue observation. I will hold off erythropoietin replacement therapy due to its angiogenesis effects and ovarian cancer history.  She can get blood transfusion if symptomatic.  Lab Results  Component Value Date   HGB 8.9 (L) 09/26/2023   TIBC 322 09/26/2023   IRONPCTSAT 21 09/26/2023   FERRITIN 326 (H) 09/26/2023    Ferritin is at goal. Hb stable.

## 2023-09-26 NOTE — Progress Notes (Signed)
 Hematology/Oncology Progress note [phone] Fax:([phone]) [phone]       CHIEF COMPLAINTS/PURPOSE OF VISIT Follow up for ovarian cancer.  ASSESSMENT & PLAN:   Cancer Staging  Malignant neoplasm of ovary (HCC) Staging form: Ovary, Fallopian Tube, and Primary Peritoneal Carcinoma, AJCC 8th Edition - Clinical stage from 11/22/2016: Stage IIIC (cT3c, cN1b, cM0) - Signed by Timmy Forbes, MD on 11/22/2016   Malignant neoplasm of ovary (HCC) 05/11/2022 CT chest abdomen pelvis w contrast showed stable disease.   Labs are reviewed and discussed with patient. 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 Folate receptor alpha positive   Recommend continue observation, and in the future, when she progresses, consider Mirvetuximab  Obtain repeat CT scan in Sept 2025   Port-A-Cath in place Continue port flush Q6-8 weeks  Macrocytic anemia No significant improvement after holding chemotherapy.  No significant improvement after IV Venofer  treatments and B12 injections. 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  continue observation. I will hold off erythropoietin replacement therapy due to its angiogenesis effects and ovarian cancer history.  She can get blood transfusion if symptomatic.  Lab Results  Component Value Date   HGB 8.9 (L) 09/26/2023   TIBC 322 09/26/2023   IRONPCTSAT 21 09/26/2023   FERRITIN 326 (H) 09/26/2023    Ferritin is at goal. Hb stable.   CHF (congestive heart failure) (HCC) Follow up with cardiology    Orders Placed This Encounter  Procedures   CT CHEST ABDOMEN PELVIS WO CONTRAST    Standing Status:   Future    Expected Date:   01/03/2024    Expiration Date:   09/25/2024    Preferred imaging location?:   [facility]    If indicated for the ordered procedure, I authorize the administration of oral contrast media per Radiology protocol:   Yes    Does the patient have a contrast media/X-ray dye allergy?:   No    CBC with Differential (Cancer Center Only)    Standing Status:   Future    Expected Date:   12/27/2023    Expiration Date:   03/26/2024   CMP (Cancer Center only)    Standing Status:   Future    Expected Date:   12/27/2023    Expiration Date:   03/26/2024   Retic Panel    Standing Status:   Future    Expected Date:   12/27/2023    Expiration Date:   03/26/2024   CA 125    Standing Status:   Future    Expected Date:   12/27/2023    Expiration Date:   03/26/2024   Follow-up in 3 months.   All questions were answered. The patient knows to call the clinic with any problems, questions or concerns.  Timmy Forbes, MD, PhD [facility] Hematology Oncology 09/26/2023      HISTORY OF PRESENTING ILLNESS: Katie Hill 73 y.o. female presents for follow up of management of recurrent  ovarian cancer. Oncology History  Malignant neoplasm of ovary (HCC)  09/20/2011 Imaging   CT chest abdomen pelvis w contrast 1. No significant interval changed compared with the previous exam.2. Stable appearance of soft tissue attenuating lesion in the posterolateral right hemipelvis. This may represent a treated site of peritoneal disease.3. Unchanged appearance of borderline enlarged left periaortic and aortocaval retroperitoneal lymph nodes.4. Unchanged 6 mm left lower lobe lung nodule. 5. No new signs of metastatic disease within the chest, abdomen or pelvis. 6. Aortic Atherosclerosis (ICD10-I70.0)  and Emphysema    11/20/2016 Initial Diagnosis   Malignant neoplasm of ovary   Pathology 11/16/2016 Surgical Pathology  CASE: [id number]  PATIENT: Yesika Spearing  Surgical Pathology Report   SPECIMEN SUBMITTED:  A. Retroperitoneal adenopathy, left  DIAGNOSIS:  A. LYMPH NODE, LEFT RETROPERITONEAL; CT-GUIDED CORE BIOPSY:  - METASTATIC HIGH-GRADE SEROUS CARCINOMA.  Pathology 03/14/2017   DIAGNOSIS:  A. OMENTUM; OMENTECTOMY:  - NO TUMOR SEEN.  - ONE NEGATIVE LYMPH NODE (0/1).   B.  RIGHT FALLOPIAN  TUBE AND OVARY; SALPINGO-OOPHORECTOMY:  - SMALL FOCI OF HIGH GRADE SEROUS CARCINOMA INVOLVING THE OVARY.  - MARKED THERAPY RELATED CHANGE.  - NO TUMOR SEEN IN THE FALLOPIAN TUBE.   C.  UTERUS, CERVIX, LEFT FALLOPIAN TUBE AND OVARY; HYSTERECTOMY AND LEFT  SALPINGO-OOPHORECTOMY:  - NABOTHIAN CYSTS.  - CYSTIC ATROPHY OF THE ENDOMETRIUM.  - SEROSAL ADHESIONS.  - UNREMARKABLE FALLOPIAN TUBE.  - OVARY SHOWING TREATMENT RELATED CHANGE.   D.  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    12/01/2016 -  Chemotherapy   Carboplatin  and taxol  x 4 neoadjuvant chemotherapy    03/14/2017 Surgery   Patient had debulking surgery on 03/14/2017. She had a laparoscopy with conversion to laparotomy, total hysterectomy, with bilateral salpingo oophorectomy, right aortic lymph node dissection, omentectomy. Pathology showed small foci of residual disease in ovary.   HRD positive, declined Olarparib maintenance trial    03/28/2017 -  Chemotherapy   Adjuvant carbo and taxol  x3 cycles   02/28/2018 Progression   Local Recurrence. CA125 146 CT abdomen pelvis w contrast showed interval enlargement of an aortocaval lymph node which currently measures 1.4 cm in short axis (axial image 75 of series 2). This lymph node is in close proximity to the previously resected retroperitoneal lymphadenopathy and is very concerning for an additional nodal metastasis.    03/15/2018 -  Chemotherapy   03/15/2018-05/17/2018 Carboplatin  and Taxol  x 4.  CA125 decrease from 49.7 to 6 after 4 cycles of treatment.  05/17/2018-10/29/20   Olarparib 300mg  BID    11/05/2018 Imaging   MRI abdomenPelvis on 11/05/2018 showed Interval progression of, now measuring 2.7 x 2.3 cm and concerning for progression of metastatic disease.retrocaval lymph node in the abdomen Olaparib  was held for short period of time. Her CA125 trended down again.  Dr. Marella Shams recommending resume  olaparib  and continue monitor.   10/06/2020 Progression    CT chest abdomen pelvis with contrast showed new lymph nodes in the prevascular space of the upper anterior mediastinum most consistent with metastasis.  Single new right lower lobe pulmonary nodule is concerning for early pulmonary metastasis.  Interval enlargement of the left periaortic retroperitoneal lymph node.  Stable adjacent aorto caval adenopathy.    Genetic Testing   Genetic testing negative for 83 genes on [facility]'s Multi-Cancer panel (ALK, APC, ATM, AXIN2, BAP1, BARD1, BLM, BMPR1A, BRCA1, BRCA2, BRIP1, CASR, CDC73, CDH1, CDK4, CDKN1B, CDKN1C, CDKN2A, CEBPA, CHEK2, CTNNA1, DICER1, DIS3L2, EGFR, EPCAM, FH, FLCN, GATA2, GPC3, GREM1, HOXB13, HRAS, KIT, MAX, MEN1, MET, MITF, MLH1, MSH2, MSH3, MSH6, MUTYH, NBN, NF1, NF2, NTHL1, PALB2, PDGFRA, PHOX2B, PMS2, POLD1, POLE, POT1, PRKAR1A, PTCH1, PTEN, RAD50, RAD51C, RAD51D, RB1, RECQL4, RET, RUNX1, SDHA, SDHAF2, SDHB, SDHC, SDHD, SMAD4, SMARCA4, SMARCB1, SMARCE1, STK11, SUFU, TERC, TERT, TMEM127, TP53, TSC1, TSC2, VHL, WRN, WT1).   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  Myraid testing negative for somatic BRACA1/2, positive for HRD.    11/03/2020 - 11/18/2021 Chemotherapy  Liposomal Doxorubicin  + Bevacizumab  q28d   11/03/20 -05/20/21 Doxil  + Bevacizumab  q28d  06/03/21 Doxil  only due to proteinuria.  07/01/21-07/15/21  Resumed on Doxil  + Bevacizumab  07/29/21 Doxil  only due to proteinuria.  11/18/21 last dose of Doxil . Stopped due to CHF   10/14/2021 Echocardiogram    Left ventricular ejection fraction, by estimation, is 55 to 60%   12/16/2021 Imaging   US  lower extremities 1. No evidence of deep venous thrombosis in either lower extremity. 2. 1.9 cm left popliteal fossa Baker's cyst.   12/23/2021 Echocardiogram   LVEF 55%   12/26/2021 Imaging   CT chest abdomen pelvis w contrast   Chest Impression:   1. New RIGHT pleural effusion and new mild  nodularity along the RIGHT oblique fissure. Recommend close attention on follow-up for potential pleural metastasis in the RIGHT hemithorax. 2. Stable LEFT lobe pulmonary nodule.3. No mediastinal lymphadenopathy.   Abdomen / Pelvis Impression:   1. Stable soft tissue lesion in the deep RIGHT pelvis. 2. Mass stable small periaortic lymph nodes. 3. No intraperitoneal free fluid the abdomen or pelvis.   01/20/2022 - 01/23/2022 Hospital Admission   Hospitalized due to acute on chronic diastolic CHF, hypertension urgency, She was seen by cardiology and nephrology. BNP was significantly elevated at 4500 She was discharged home with Losartan , Aldactone , Lasix , beta blocker and hydralazine .    02/21/2022 Imaging   CT chest abdomen pelvis w contrast Stable small soft tissue lesion in the right perirectal lesion. Stable mild retroperitoneal and retrocrural lymphadenopathy.Stable 6 mm left lower lobe pulmonary nodule.No new or progressive disease within the chest, abdomen, or pelvis   02/22/2022 Imaging   CT chest abdomen pelvis w contrast Stable small soft tissue lesion in the right perirectal lesion. Stable mild retroperitoneal and retrocrural lymphadenopathy. Stable 6 mm left lower lobe pulmonary nodule. No new or progressive disease within the chest, abdomen, or pelvis.   05/11/2022 Imaging   CT chest abdomen pelvis without contrast showed 1. Stable examination without convincing evidence of new or progressive disease in the chest, abdomen or pelvis on this noncontrast enhanced CT. 2. Stable small soft tissue right perirectal soft tissue lesion.3. Stable 6 mm left lower lobe pulmonary nodule. 4. Stable prominent left supraclavicular and retroperitoneal lymph nodes. 5. Large volume of formed stool in the colon. Correlate for constipation. 6.  Aortic Atherosclerosis   07/21/2022 Bone Marrow Biopsy   bone marrow is slightly hypercellular at best with trilineage hematopoiesis.  There are  generally mild dyspoietic changes present particularly in the erythroid and megakaryocytic cell lines.  No increase in blastic cells identified.  The overall changes are limited and not considered specific or diagnostic of a myeloid neoplasm and may be secondary in nature in this setting.  Nonetheless, correlation with cytogenetic and FISH studies is recommended.  There is no diagnostic evidence of a lymphoproliferative process, metastatic carcinoma or plasma cell neoplasm.  MDS FISH negative.  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    12/12/2022 Imaging   CT chest abdomen pelvis w contrast showed 1. Similar size of the soft tissue nodule adjacent to the rectum in the posterior pelvis measures similar prior at 20 x 12 mm previously 17 x 13 mm and most recent remote imaging 19 x 11 mm. 2. Stable solid 6 mm left lower lobe pulmonary nodule. 3. No new or progressive findings in the chest, abdomen or pelvis. 4.  Aortic Atherosclerosis (ICD10-I70.0)  High grade ovarian cancer [facility]    INTERVAL HISTORY KAMIYA ACORD is a 73 y.o. female who has above history reviewed by me today presents for follow up visit for recurrent Ovarian cancer Patient reports no new problems since last visit.  She denies feeling very tired or fatigued. CHF is controlled well.  Denies weight loss, fever, chills, fatigue, night sweats.   + stung by bee, left elbow and right forearm focal swelling and itchiness. No sob, wheezing etc.  . Review of Systems  Constitutional:  Positive for fatigue. Negative for appetite change, chills and fever.  HENT:   Negative for hearing loss and voice change.   Eyes:  Negative for eye problems.  Respiratory:  Negative for chest tightness and cough.   Cardiovascular:  Negative for chest pain and leg swelling.  Gastrointestinal:  Negative for abdominal distention, abdominal pain and blood in  stool.  Endocrine: Negative for hot flashes.  Genitourinary:  Negative for difficulty urinating and frequency.   Musculoskeletal:  Negative for arthralgias.  Skin:  Negative for itching and rash.  Neurological:  Negative for extremity weakness.  Hematological:  Negative for adenopathy.  Psychiatric/Behavioral:  Negative for confusion.      Katie Hill MEDICAL HISTORY: Past Medical History:  Diagnosis Date   (HFpEF) heart failure with preserved ejection fraction (HCC)    a. 12/2021 Echo: EF 55%, no rwma, nl RV fxn, mild MR, mild-mod TR. Triv AI.   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   Pelvic mass in female     SURGICAL HISTORY: Past Surgical History:  Procedure Laterality Date   APPENDECTOMY     IR BONE MARROW BIOPSY & ASPIRATION  07/21/2022   LAPAROSCOPY N/A 03/14/2017   Procedure: LAPAROSCOPY OPERATIVE;  Surgeon: Hermine Loots, MD;  Location: [facility];  Service: Gynecology;  Laterality: N/A;   LAPAROTOMY N/A 03/14/2017   Procedure: LAPAROTOMY;  Surgeon: Hermine Loots, MD;  Location: [facility];  Service: Gynecology;  Laterality: N/A;   LYMPH NODE DISSECTION N/A 03/14/2017   Procedure: LYMPH NODE DISSECTION;  Surgeon: Hermine Loots, MD;  Location: [facility];  Service: Gynecology;  Laterality: N/A;   OMENTECTOMY N/A 03/14/2017   Procedure: OMENTECTOMY;  Surgeon: Hermine Loots, MD;  Location: [facility];  Service: Gynecology;  Laterality: N/A;   PORTA CATH INSERTION N/A 11/27/2016   Procedure: Melville Stade Cath Insertion;  Surgeon: Celso College, MD;  Location: [facility];  Service: Cardiovascular;  Laterality: N/A;    SOCIAL HISTORY: Social History   Tobacco Use   Smoking status: Never   Smokeless tobacco: Never  Vaping Use   Vaping status: Never  Used  Substance Use Topics   Alcohol  use: Not Currently   Drug use: No     FAMILY HISTORY Family History  Problem Relation Age of Onset   Throat cancer Cousin    Throat cancer Cousin    Leukemia Cousin     ALLERGIES:  is allergic to omeprazole .  MEDICATIONS:  Current Outpatient Medications  Medication Sig Dispense Refill   aspirin  81 MG chewable tablet Chew 1 tablet (81 mg total) by mouth daily. 30 tablet 0   cholecalciferol (VITAMIN D3) 25 MCG (1000 UNIT) tablet Take 2,000 Units by mouth daily.  Cyanocobalamin  (B-12) 1000 MCG CAPS Take 1,000 mg by mouth daily.     empagliflozin  (JARDIANCE ) 10 MG TABS tablet Take 1 tablet (10 mg total) by mouth daily before breakfast. 90 tablet 3   feeding supplement (ENSURE ENLIVE / ENSURE PLUS) LIQD Take 237 mLs by mouth 3 (three) times daily between meals. 14220 mL 0   Ferrous Sulfate (IRON ) 325 (65 Fe) MG TABS Take 325 mg by mouth daily.     furosemide  (LASIX ) 40 MG tablet TAKE ONE TABLET BY MOUTH EVERY DAY (CAN TAKE EXTRA TABLET IN AFTERNOON IF GAIN 3 POUNDS IN A DAY OR 5 POUNDS IN A WEEK) 90 tablet 2   levothyroxine  (SYNTHROID ) 100 MCG tablet Take 1 tablet (100 mcg total) by mouth daily. 90 tablet 3   lidocaine -prilocaine  (EMLA ) cream Apply 1 application topically as needed. Apply small amount to port site at least 1 hour prior to it being accessed, cover with plastic wrap 30 g 1   ondansetron  (ZOFRAN ) 4 MG tablet Take 1 tablet (4 mg total) by mouth every 8 (eight) hours as needed for nausea or vomiting. 60 tablet 3   sacubitril -valsartan  (ENTRESTO ) 49-51 MG Take 1 tablet by mouth 2 (two) times daily. 180 tablet 3   spironolactone  (ALDACTONE ) 25 MG tablet Take 1 tablet (25 mg total) by mouth daily. 90 tablet 3   No current facility-administered medications for this visit.   Facility-Administered Medications Ordered in Other Visits  Medication Dose Route Frequency Provider Last Rate Last Admin   pegfilgrastim  (NEULASTA  ONPRO KIT) 6  MG/0.6ML injection             PHYSICAL EXAMINATION:  ECOG PERFORMANCE STATUS: 0 - Asymptomatic Vitals:   09/26/23 0952  BP: 131/78  Pulse: (!) 59  Resp: 15  Temp: 97.7 F (36.5 C)  SpO2: 100%     Filed Weights   09/26/23 0952  Weight: 106 lb 14.4 oz (48.5 kg)      Physical Exam Constitutional:      General: She is not in acute distress.    Appearance: She is not diaphoretic.     Comments: Thin built, she walks independantly  HENT:     Head: Normocephalic.   Eyes:     General: No scleral icterus.    Pupils: Pupils are equal, round, and reactive to light.   Neck:     Vascular: No JVD.   Cardiovascular:     Rate and Rhythm: Normal rate.  Pulmonary:     Effort: Pulmonary effort is normal. No respiratory distress.     Breath sounds: Normal breath sounds.  Abdominal:     General: Bowel sounds are normal. There is no distension.     Palpations: Abdomen is soft.   Musculoskeletal:        General: Normal range of motion.     Cervical back: Normal range of motion and neck supple.     Right lower leg: No edema.     Left lower leg: No edema.  Lymphadenopathy:     Cervical: No cervical adenopathy.   Skin:    General: Skin is warm and dry.     Findings: No rash.     Comments: Right anterior medi port +  Bee sting    Neurological:     Mental Status: She is alert and oriented to person, place, and time. Mental status is at baseline.     Motor: No abnormal muscle tone.   Psychiatric:        Mood  and Affect: Mood and affect normal.        LABORATORY DATA: I have personally reviewed the data as listed:     Latest Ref Rng & Units 09/26/2023    9:39 AM 06/25/2023    9:53 AM 03/26/2023    9:48 AM  CBC  WBC 4.0 - 10.5 K/uL 4.1  4.0  3.7   Hemoglobin 12.0 - 15.0 g/dL 8.9  8.2  8.0   Hematocrit 36.0 - 46.0 % 26.8  25.5  24.1   Platelets 150 - 400 K/uL 150  186  130       Latest Ref Rng & Units 09/26/2023    9:39 AM 06/25/2023    9:53 AM 03/26/2023     9:48 AM  CMP  Glucose 70 - 99 mg/dL 85  161  90   BUN 8 - 23 mg/dL 35  31  32   Creatinine 0.44 - 1.00 mg/dL 0.96  0.45  4.09   Sodium 135 - 145 mmol/L 131  132  131   Potassium 3.5 - 5.1 mmol/L 4.6  4.3  4.9   Chloride 98 - 111 mmol/L 103  105  102   CO2 22 - 32 mmol/L 21  22  21    Calcium  8.9 - 10.3 mg/dL 9.5  9.3  9.6   Total Protein 6.5 - 8.1 g/dL 7.1  7.1  7.3   Total Bilirubin 0.0 - 1.2 mg/dL 0.9  0.5  0.5   Alkaline Phos 38 - 126 U/L 77  54  65   AST 15 - 41 U/L 17  18  13    ALT 0 - 44 U/L 10  10  8       RADIOGRAPHIC STUDIES: I have personally reviewed the radiological images as listed and agreed with the findings in the report. CT CHEST ABDOMEN PELVIS WO CONTRAST Result Date: 07/12/2023 CLINICAL DATA:  Follow-up ovarian cancer EXAM: CT CHEST, ABDOMEN AND PELVIS WITHOUT CONTRAST TECHNIQUE: Multidetector CT imaging of the chest, abdomen and pelvis was performed following the standard protocol without IV contrast. RADIATION DOSE REDUCTION: This exam was performed according to the departmental dose-optimization program which includes automated exposure control, adjustment of the mA and/or kV according to patient size and/or use of iterative reconstruction technique. COMPARISON:  04/02/2023 FINDINGS: CT CHEST FINDINGS Cardiovascular: Heart is normal in size.  No pericardial effusion. No evidence of thoracic aortic aneurysm. Right chest port terminates at the cavoatrial junction. Mediastinum/Nodes: No suspicious mediastinal lymphadenopathy. Visualized thyroid  is unremarkable. Lungs/Pleura: Biapical pleural-parenchymal scarring. Stable calcified granuloma in the left lower lobe (series 4/image 86). No new/suspicious pulmonary nodules. No focal consolidation. No pleural effusion or pneumothorax. Musculoskeletal: Visualized osseous structures are within normal limits. CT ABDOMEN PELVIS FINDINGS Hepatobiliary: Unenhanced liver is unremarkable. Gallbladder is unremarkable. No intrahepatic or  extrahepatic ductal dilatation. Pancreas: Within normal limits. Spleen: Within normal limits. Adrenals/Urinary Tract: Adrenal glands are within normal limits. Kidneys are within limits. No renal, ureteral, or bladder calculi. No hydronephrosis. Bladder is underdistended but unremarkable. Stomach/Bowel: Stomach is within normal limits. No evidence of bowel obstruction. Appendix is not discretely visualized. No colonic wall thickening or inflammatory changes. Vascular/Lymphatic: No evidence of abdominal aortic aneurysm. Surgical clips in the right periaortic/aortocaval region. No suspicious abdominopelvic lymphadenopathy. Reproductive: Status post hysterectomy and bilateral salpingo oophorectomy. Other: No abdominopelvic ascites. 9 x 18 mm pleural-based nodule in the right deep pelvis (series 2/image 108), previously 10 x 21 mm. Musculoskeletal: Visualized osseous structures are within normal limits. IMPRESSION: Status post hysterectomy  and bilateral salpingo-oophorectomy. 9 x 18 mm pleural-based nodule in the right deep pelvis, mildly improved. Otherwise, no findings suspicious for recurrent or metastatic disease. Electronically Signed   By: Zadie Herter M.D.   On: 07/12/2023 23:47

## 2023-09-27 LAB — CA 125: Cancer Antigen (CA) 125: 10.4 U/mL (ref 0.0–38.1)

## 2023-11-15 ENCOUNTER — Other Ambulatory Visit (INDEPENDENT_AMBULATORY_CARE_PROVIDER_SITE_OTHER): Payer: Self-pay | Admitting: Vascular Surgery

## 2023-11-15 DIAGNOSIS — I6523 Occlusion and stenosis of bilateral carotid arteries: Secondary | ICD-10-CM

## 2023-11-16 ENCOUNTER — Ambulatory Visit (INDEPENDENT_AMBULATORY_CARE_PROVIDER_SITE_OTHER): Admitting: Vascular Surgery

## 2023-11-16 ENCOUNTER — Other Ambulatory Visit (INDEPENDENT_AMBULATORY_CARE_PROVIDER_SITE_OTHER): Payer: Medicare Other

## 2023-11-16 VITALS — BP 110/68 | HR 69 | Ht 68.0 in | Wt 108.0 lb

## 2023-11-16 DIAGNOSIS — I10 Essential (primary) hypertension: Secondary | ICD-10-CM | POA: Diagnosis not present

## 2023-11-16 DIAGNOSIS — I6523 Occlusion and stenosis of bilateral carotid arteries: Secondary | ICD-10-CM

## 2023-11-16 DIAGNOSIS — C569 Malignant neoplasm of unspecified ovary: Secondary | ICD-10-CM

## 2023-11-16 NOTE — Assessment & Plan Note (Signed)
 Carotid duplex today reveals minimal carotid disease bilaterally on the lower end of the 1 to 39% range.  On a baby aspirin  a day.  Recheck in 2 years.

## 2023-11-16 NOTE — Progress Notes (Signed)
 MRN : [id number]  Katie Hill is a 73 y.o. (06-28-1950) female who presents with chief complaint of No chief complaint on file. SABRA  History of Present Illness: Patient returns in follow-up of her mild carotid disease.  She has done well and had no major issues since her last visit 2 years ago.  She denies focal neurologic symptoms. Specifically, the patient denies amaurosis fugax, speech or swallowing difficulties, or arm or leg weakness or numbness.  Carotid duplex today reveals minimal carotid disease bilaterally on the lower end of the 1 to 39% range.  Current Outpatient Medications  Medication Sig Dispense Refill   aspirin  81 MG chewable tablet Chew 1 tablet (81 mg total) by mouth daily. 30 tablet 0   cholecalciferol (VITAMIN D3) 25 MCG (1000 UNIT) tablet Take 2,000 Units by mouth daily.     Cyanocobalamin  (B-12) 1000 MCG CAPS Take 1,000 mg by mouth daily.     empagliflozin  (JARDIANCE ) 10 MG TABS tablet Take 1 tablet (10 mg total) by mouth daily before breakfast. 90 tablet 3   feeding supplement (ENSURE ENLIVE / ENSURE PLUS) LIQD Take 237 mLs by mouth 3 (three) times daily between meals. 14220 mL 0   Ferrous Sulfate (IRON ) 325 (65 Fe) MG TABS Take 325 mg by mouth daily.     furosemide  (LASIX ) 40 MG tablet TAKE ONE TABLET BY MOUTH EVERY DAY (CAN TAKE EXTRA TABLET IN AFTERNOON IF GAIN 3 POUNDS IN A DAY OR 5 POUNDS IN A WEEK) 90 tablet 2   levothyroxine  (SYNTHROID ) 100 MCG tablet Take 1 tablet (100 mcg total) by mouth daily. 90 tablet 3   lidocaine -prilocaine  (EMLA ) cream Apply 1 application topically as needed. Apply small amount to port site at least 1 hour prior to it being accessed, cover with plastic wrap 30 g 1   ondansetron  (ZOFRAN ) 4 MG tablet Take 1 tablet (4 mg total) by mouth every 8 (eight) hours as needed for nausea or vomiting. 60 tablet 3   sacubitril -valsartan  (ENTRESTO ) 49-51 MG Take 1 tablet by mouth 2 (two) times daily. 180 tablet 3   spironolactone  (ALDACTONE ) 25 MG  tablet Take 1 tablet (25 mg total) by mouth daily. 90 tablet 3   No current facility-administered medications for this visit.   Facility-Administered Medications Ordered in Other Visits  Medication Dose Route Frequency Provider Last Rate Last Admin   pegfilgrastim  (NEULASTA  ONPRO KIT) 6 MG/0.6ML injection             Past Medical History:  Diagnosis Date   (HFpEF) heart failure with preserved ejection fraction (HCC)    a. 12/2021 Echo: EF 55%, no rwma, nl RV fxn, mild MR, mild-mod TR. Triv AI.   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   Pelvic mass in female     Past Surgical History:  Procedure Laterality Date   APPENDECTOMY     IR BONE MARROW BIOPSY & ASPIRATION  07/21/2022   LAPAROSCOPY N/A 03/14/2017   Procedure: LAPAROSCOPY OPERATIVE;  Surgeon: Mancil Barter, MD;  Location: [facility];  Service: Gynecology;  Laterality: N/A;   LAPAROTOMY N/A 03/14/2017   Procedure: LAPAROTOMY;  Surgeon: Mancil Barter, MD;  Location: [facility];  Service: Gynecology;  Laterality: N/A;   LYMPH NODE  DISSECTION N/A 03/14/2017   Procedure: LYMPH NODE DISSECTION;  Surgeon: Mancil Barter, MD;  Location: [facility];  Service: Gynecology;  Laterality: N/A;   OMENTECTOMY N/A 03/14/2017   Procedure: OMENTECTOMY;  Surgeon: Mancil Barter, MD;  Location: [facility];  Service: Gynecology;  Laterality: N/A;   PORTA CATH INSERTION N/A 11/27/2016   Procedure: Pat Cath Insertion;  Surgeon: Marea Selinda RAMAN, MD;  Location: [facility];  Service: Cardiovascular;  Laterality: N/A;     Social History   Tobacco Use   Smoking status: Never   Smokeless tobacco: Never  Vaping Use   Vaping status: Never Used  Substance Use Topics   Alcohol  use: Not Currently   Drug use: No       Family History  Problem Relation Age of Onset   Throat cancer Cousin    Throat cancer Cousin    Leukemia Cousin      Allergies  Allergen Reactions   Omeprazole  Rash     REVIEW OF SYSTEMS (Negative unless checked)  Constitutional: [x] Weight loss  [] Fever  [] Chills Cardiac: [] Chest pain   [] Chest pressure   [x] Palpitations   [] Shortness of breath when laying flat   [] Shortness of breath at rest   [] Shortness of breath with exertion. Vascular:  [] Pain in legs with walking   [] Pain in legs at rest   [] Pain in legs when laying flat   [] Claudication   [] Pain in feet when walking  [] Pain in feet at rest  [] Pain in feet when laying flat   [] History of DVT   [] Phlebitis   [] Swelling in legs   [] Varicose veins   [] Non-healing ulcers Pulmonary:   [] Uses home oxygen   [] Productive cough   [] Hemoptysis   [] Wheeze  [] COPD   [] Asthma Neurologic:  [] Dizziness  [] Blackouts   [] Seizures   [] History of stroke   [] History of TIA  [] Aphasia   [] Temporary blindness   [] Dysphagia   [] Weakness or numbness in arms   [] Weakness or numbness in legs Musculoskeletal:  [] Arthritis   [] Joint swelling   [] Joint pain   [] Low back pain Hematologic:  [] Easy bruising  [] Easy bleeding   [] Hypercoagulable state   [] Anemic  [] Hepatitis Gastrointestinal:  [] Blood in stool   [] Vomiting blood  [] Gastroesophageal reflux/heartburn   [] Difficulty swallowing. Genitourinary:  [] Chronic kidney disease   [] Difficult urination  [] Frequent urination  [] Burning with urination   [] Blood in urine Skin:  [] Rashes   [] Ulcers   [] Wounds Psychological:  [] History of anxiety   []  History of major depression.  Physical Examination  Vitals:   11/16/23 0857  BP: 110/68  Pulse: 69  Weight: 108 lb (49 kg)  Height: 5' 8 (1.727 m)   Body mass index is 16.42 kg/m. Gen:  NAD, thin Head: [place]/AT, No temporalis wasting. Ear/Nose/Throat: Hearing grossly intact, nares w/o erythema or drainage, trachea midline Eyes: Conjunctiva clear.  Sclera non-icteric Neck: Supple.  Soft left carotid bruit  Pulmonary:  Good air movement, equal and clear to auscultation bilaterally.  Cardiac: RRR, No JVD Vascular: Port in right chest without erythema or drainage Vessel Right Left  Radial Palpable Palpable           Musculoskeletal: M/S 5/5 throughout.  No deformity or atrophy.  No edema. Neurologic: CN 2-12 intact. Sensation grossly intact in extremities.  Symmetrical.  Speech is fluent. Motor exam as listed above. Psychiatric: Judgment intact, Mood & affect appropriate for pt's clinical situation. Dermatologic: No rashes or ulcers noted.  No cellulitis or open wounds.  CBC Lab Results  Component Value Date   WBC 4.1 09/26/2023   HGB 8.9 (L) 09/26/2023   HCT 26.8 (L) 09/26/2023   MCV 97.8 09/26/2023   PLT 150 09/26/2023    BMET    Component Value Date/Time   NA 131 (L) 09/26/2023 0939   K 4.6 09/26/2023 0939   CL 103 09/26/2023 0939   CO2 21 (L) 09/26/2023 0939   GLUCOSE 85 09/26/2023 0939   BUN 35 (H) 09/26/2023 0939   CREATININE 1.40 (H) 09/26/2023 0939   CALCIUM  9.5 09/26/2023 0939   GFRNONAA 40 (L) 09/26/2023 0939   GFRAA >60 01/01/2020 1251   CrCl cannot be calculated (Patient's most recent lab result is older than the maximum 21 days allowed.).  COAG Lab Results  Component Value Date   INR 1.07 11/16/2016    Radiology No results found.   Assessment/Plan Carotid stenosis Carotid duplex today reveals minimal carotid disease bilaterally on the lower end of the 1 to 39% range.  On a baby aspirin  a day.  Recheck in 2 years.  Hypertension blood pressure control important in reducing the progression of atherosclerotic disease. On appropriate oral medications.     Malignant neoplasm of ovary (HCC) I placed a port back in 2018 and is still working well.  Not getting chemo at this time.  Gets her port flushed regularly.  Selinda Gu, MD  11/16/2023 9:29 AM    This note was created with Dragon  medical transcription system.  Any errors from dictation are purely unintentional

## 2023-11-21 ENCOUNTER — Inpatient Hospital Stay: Attending: Oncology

## 2023-11-21 DIAGNOSIS — Z452 Encounter for adjustment and management of vascular access device: Secondary | ICD-10-CM | POA: Insufficient documentation

## 2023-11-21 DIAGNOSIS — Z8543 Personal history of malignant neoplasm of ovary: Secondary | ICD-10-CM | POA: Diagnosis present

## 2023-11-23 ENCOUNTER — Ambulatory Visit: Attending: Cardiology | Admitting: Cardiology

## 2023-11-23 ENCOUNTER — Encounter: Payer: Self-pay | Admitting: Cardiology

## 2023-11-23 VITALS — BP 94/50 | HR 71 | Ht 67.0 in | Wt 107.6 lb

## 2023-11-23 DIAGNOSIS — I5032 Chronic diastolic (congestive) heart failure: Secondary | ICD-10-CM | POA: Diagnosis present

## 2023-11-23 DIAGNOSIS — I10 Essential (primary) hypertension: Secondary | ICD-10-CM | POA: Insufficient documentation

## 2023-11-23 MED ORDER — SPIRONOLACTONE 25 MG PO TABS
12.5000 mg | ORAL_TABLET | Freq: Every day | ORAL | Status: AC
Start: 2023-11-23 — End: ?

## 2023-11-23 NOTE — Progress Notes (Signed)
 Cardiology Office Note:    Date:  11/23/2023   ID:  Katie Hill, DOB 07-13-1950, MRN [id number]  PCP:  Rudolpho Norleen BIRCH, MD   [facility] Providers Cardiologist:  Redell Cave, MD     Referring MD: Rudolpho Norleen BIRCH, MD   Chief Complaint  Patient presents with   Follow-up    Pt doing good.    History of Present Illness:    Katie Hill is a 73 y.o. female with a hx of hypertension, HFpEF, ovarian cancer, who presents for follow-up.    Previously seen for low normal BP, hydralazine  was stopped.  Aldactone  increased to 25 mg daily.  Feels well, denies edema, chest pain or shortness of breath.  Denies dizziness or syncope.  Compliant with medications as prescribed.  BP at home still ranges in the 90s to low 100s.   Prior notes/studies Echocardiogram 12/2021 EF 55% History of bradycardia with beta-blocker.  Past Medical History:  Diagnosis Date   (HFpEF) heart failure with preserved ejection fraction (HCC)    a. 12/2021 Echo: EF 55%, no rwma, nl RV fxn, mild MR, mild-mod TR. Triv AI.   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   Pelvic mass in female     Past Surgical History:  Procedure Laterality Date   APPENDECTOMY     IR BONE MARROW BIOPSY & ASPIRATION  07/21/2022   LAPAROSCOPY N/A 03/14/2017   Procedure: LAPAROSCOPY OPERATIVE;  Surgeon: Mancil Barter, MD;  Location: [facility];  Service: Gynecology;  Laterality: N/A;   LAPAROTOMY N/A 03/14/2017   Procedure: LAPAROTOMY;  Surgeon: Mancil Barter, MD;  Location: [facility];  Service: Gynecology;  Laterality: N/A;   LYMPH NODE DISSECTION N/A 03/14/2017   Procedure: LYMPH NODE DISSECTION;  Surgeon: Mancil Barter, MD;  Location: [facility];  Service: Gynecology;  Laterality: N/A;    OMENTECTOMY N/A 03/14/2017   Procedure: OMENTECTOMY;  Surgeon: Mancil Barter, MD;  Location: [facility];  Service: Gynecology;  Laterality: N/A;   PORTA CATH INSERTION N/A 11/27/2016   Procedure: Pat Cath Insertion;  Surgeon: Marea Selinda RAMAN, MD;  Location: [facility];  Service: Cardiovascular;  Laterality: N/A;    Current Medications: Current Meds  Medication Sig   aspirin  81 MG chewable tablet Chew 1 tablet (81 mg total) by mouth daily.   cholecalciferol (VITAMIN D3) 25 MCG (1000 UNIT) tablet Take 2,000 Units by mouth daily.   Cyanocobalamin  (B-12) 1000 MCG CAPS Take 1,000 mg by mouth daily.   empagliflozin  (JARDIANCE ) 10 MG TABS tablet Take 1 tablet (10 mg total) by mouth daily before breakfast.   feeding supplement (ENSURE ENLIVE / ENSURE PLUS) LIQD Take 237 mLs by mouth 3 (three) times daily between meals.   Ferrous Sulfate (IRON ) 325 (65 Fe) MG TABS Take 325 mg by mouth daily.   furosemide  (LASIX ) 40 MG tablet TAKE ONE TABLET BY MOUTH EVERY DAY (CAN TAKE EXTRA TABLET IN AFTERNOON IF GAIN 3 POUNDS IN A DAY OR 5 POUNDS IN A WEEK)   levothyroxine  (SYNTHROID ) 100 MCG tablet Take 1 tablet (100 mcg total) by mouth daily.   lidocaine -prilocaine  (EMLA ) cream Apply 1 application topically as needed. Apply small amount to port site at least 1 hour prior to it being  accessed, cover with plastic wrap   ondansetron  (ZOFRAN ) 4 MG tablet Take 1 tablet (4 mg total) by mouth every 8 (eight) hours as needed for nausea or vomiting.   sacubitril -valsartan  (ENTRESTO ) 49-51 MG Take 1 tablet by mouth 2 (two) times daily.   [DISCONTINUED] spironolactone  (ALDACTONE ) 25 MG tablet Take 1 tablet (25 mg total) by mouth daily.     Allergies:   Omeprazole    Social History   Socioeconomic History   Marital status: Married    Spouse name: Not on file   Number of children: Not on file   Years of education: Not on file   Highest education level: Not on file  Occupational History   Not on file   Tobacco Use   Smoking status: Never   Smokeless tobacco: Never  Vaping Use   Vaping status: Never Used  Substance and Sexual Activity   Alcohol  use: Not Currently   Drug use: No   Sexual activity: Yes    Birth control/protection: Post-menopausal  Other Topics Concern   Not on file  Social History Narrative   Lives locally with husband.  Was fairly active.   Social Drivers of Corporate investment banker Strain: Low Risk  (04/21/2021)   Overall Financial Resource Strain (CARDIA)    Difficulty of Paying Living Expenses: Not hard at all  Food Insecurity: No Food Insecurity (01/22/2022)   Hunger Vital Sign    Worried About Running Out of Food in the Last Year: Never true    Ran Out of Food in the Last Year: Never true  Transportation Needs: No Transportation Needs (01/22/2022)   PRAPARE - Administrator, Civil Service (Medical): No    Lack of Transportation (Non-Medical): No  Physical Activity: Insufficiently Active (04/21/2021)   Exercise Vital Sign    Days of Exercise per Week: 6 days    Minutes of Exercise per Session: 20 min  Stress: No Stress Concern Present (04/21/2021)   [employer] of Occupational Health - Occupational Stress Questionnaire    Feeling of Stress : Not at all  Social Connections: Moderately Isolated (04/21/2021)   Social Connection and Isolation Panel    Frequency of Communication with Friends and Family: Three times a week    Frequency of Social Gatherings with Friends and Family: Twice a week    Attends Religious Services: Never    Database administrator or Organizations: No    Attends Engineer, structural: Never    Marital Status: Married     Family History: The patient's family history includes Leukemia in her cousin; Throat cancer in her cousin and cousin.  ROS:   Please see the history of present illness.     All other systems reviewed and are negative.  EKGs/Labs/Other Studies Reviewed:    The following studies were  reviewed today:        Recent Labs: 09/26/2023: ALT 10; BUN 35; Creatinine 1.40; Hemoglobin 8.9; Platelet Count 150; Potassium 4.6; Sodium 131  Recent Lipid Panel No results found for: CHOL, TRIG, HDL, CHOLHDL, VLDL, LDLCALC, LDLDIRECT   Risk Assessment/Calculations:         Physical Exam:    VS:  BP (!) 94/50 (BP Location: Left Arm, Patient Position: Sitting, Cuff Size: Normal)   Pulse 71   Ht 5' 7 (1.702 m)   Wt 107 lb 9.6 oz (48.8 kg)   SpO2 99%   BMI 16.85 kg/m     Wt Readings from Last 3 Encounters:  11/23/23 107 lb 9.6 oz (48.8 kg)  11/16/23 108 lb (49 kg)  09/26/23 106 lb 14.4 oz (48.5 kg)     GEN:  Well nourished, well developed in no acute distress HEENT: Normal NECK: No JVD; No carotid bruits CARDIAC: RRR, no murmurs, rubs, gallops RESPIRATORY:  Clear to auscultation without rales, wheezing or rhonchi  ABDOMEN: Soft, non-tender, non-distended MUSCULOSKELETAL: no edema; No deformity  SKIN: Warm and dry NEUROLOGIC:  Alert and oriented x 3 PSYCHIATRIC:  Normal affect   ASSESSMENT:    1. Primary hypertension   2. Chronic diastolic heart failure (HCC)    PLAN:    In order of problems listed above:  Hypertension, BP controlled/low normal, reduce Aldactone  to 12.5 mg daily,continue Entresto  49-51 mg twice daily. HFpEF, appears euvolemic.  Continue Lasix  40 mg daily, Aldactone  12.5 mg daily.  Echo 9/23 EF 55%.  describes NYHA class II symptoms.  Follow-up in 6 months.     Medication Adjustments/Labs and Tests Ordered: Current medicines are reviewed at length with the patient today.  Concerns regarding medicines are outlined above.  No orders of the defined types were placed in this encounter.  Meds ordered this encounter  Medications   spironolactone  (ALDACTONE ) 25 MG tablet    Sig: Take 0.5 tablets (12.5 mg total) by mouth daily.    Patient Instructions  Medication Instructions:  - DECREASE spironolactone  to 12.5 daily (half a  tablet) *If you need a refill on your cardiac medications before your next appointment, please call your pharmacy*  Lab Work: No labs ordered today  If you have labs (blood work) drawn today and your tests are completely normal, you will receive your results only by: MyChart Message (if you have MyChart) OR A paper copy in the mail If you have any lab test that is abnormal or we need to change your treatment, we will call you to review the results.  Testing/Procedures: No test ordered today   Follow-Up: At [facility], you and your health needs are our priority.  As part of our continuing mission to provide you with exceptional heart care, our providers are all part of one team.  This team includes your primary Cardiologist (physician) and Advanced Practice Providers or APPs (Physician Assistants and Nurse Practitioners) who all work together to provide you with the care you need, when you need it.  Your next appointment:   6 month(s)  Provider:   You may see Redell Cave, MD or one of the following Advanced Practice Providers on your designated Care Team:   Lonni Meager, NP Lesley Maffucci, PA-C Bernardino Bring, PA-C Cadence [place], PA-C Tylene Lunch, NP Barnie Hila, NP    We recommend signing up for the patient portal called MyChart.  Sign up information is provided on this After Visit Summary.  MyChart is used to connect with patients for Virtual Visits (Telemedicine).  Patients are able to view lab/test results, encounter notes, upcoming appointments, etc.  Non-urgent messages can be sent to your provider as well.   To learn more about what you can do with MyChart, go to [email or web address].          Signed, Redell Cave, MD  11/23/2023 9:34 AM    [facility]

## 2023-11-23 NOTE — Patient Instructions (Signed)
 Medication Instructions:  - DECREASE spironolactone  to 12.5 daily (half a tablet) *If you need a refill on your cardiac medications before your next appointment, please call your pharmacy*  Lab Work: No labs ordered today  If you have labs (blood work) drawn today and your tests are completely normal, you will receive your results only by: MyChart Message (if you have MyChart) OR A paper copy in the mail If you have any lab test that is abnormal or we need to change your treatment, we will call you to review the results.  Testing/Procedures: No test ordered today   Follow-Up: At [facility], you and your health needs are our priority.  As part of our continuing mission to provide you with exceptional heart care, our providers are all part of one team.  This team includes your primary Cardiologist (physician) and Advanced Practice Providers or APPs (Physician Assistants and Nurse Practitioners) who all work together to provide you with the care you need, when you need it.  Your next appointment:   6 month(s)  Provider:   You may see Redell Cave, MD or one of the following Advanced Practice Providers on your designated Care Team:   Lonni Meager, NP Lesley Maffucci, PA-C Bernardino Bring, PA-C Cadence [place], PA-C Tylene Lunch, NP Barnie Hila, NP    We recommend signing up for the patient portal called MyChart.  Sign up information is provided on this After Visit Summary.  MyChart is used to connect with patients for Virtual Visits (Telemedicine).  Patients are able to view lab/test results, encounter notes, upcoming appointments, etc.  Non-urgent messages can be sent to your provider as well.   To learn more about what you can do with MyChart, go to [email or web address].

## 2024-01-03 ENCOUNTER — Ambulatory Visit
Admission: RE | Admit: 2024-01-03 | Discharge: 2024-01-03 | Disposition: A | Discharge status: Home / Self Care | Source: Ambulatory Visit | Attending: Oncology | Admitting: Oncology

## 2024-01-03 DIAGNOSIS — C563 Malignant neoplasm of bilateral ovaries: Secondary | ICD-10-CM | POA: Insufficient documentation

## 2024-01-16 ENCOUNTER — Inpatient Hospital Stay (HOSPITAL_BASED_OUTPATIENT_CLINIC_OR_DEPARTMENT_OTHER): Admitting: Oncology

## 2024-01-16 ENCOUNTER — Inpatient Hospital Stay: Attending: Oncology

## 2024-01-16 ENCOUNTER — Encounter: Payer: Self-pay | Admitting: Oncology

## 2024-01-16 VITALS — BP 114/67 | HR 70 | Temp 97.4°F | Resp 18 | Wt 108.5 lb

## 2024-01-16 DIAGNOSIS — Z95828 Presence of other vascular implants and grafts: Secondary | ICD-10-CM | POA: Diagnosis not present

## 2024-01-16 DIAGNOSIS — N189 Chronic kidney disease, unspecified: Secondary | ICD-10-CM | POA: Diagnosis not present

## 2024-01-16 DIAGNOSIS — C569 Malignant neoplasm of unspecified ovary: Secondary | ICD-10-CM

## 2024-01-16 DIAGNOSIS — J439 Emphysema, unspecified: Secondary | ICD-10-CM | POA: Insufficient documentation

## 2024-01-16 DIAGNOSIS — I5033 Acute on chronic diastolic (congestive) heart failure: Secondary | ICD-10-CM | POA: Insufficient documentation

## 2024-01-16 DIAGNOSIS — I700 Atherosclerosis of aorta: Secondary | ICD-10-CM | POA: Insufficient documentation

## 2024-01-16 DIAGNOSIS — D539 Nutritional anemia, unspecified: Secondary | ICD-10-CM

## 2024-01-16 DIAGNOSIS — R911 Solitary pulmonary nodule: Secondary | ICD-10-CM | POA: Insufficient documentation

## 2024-01-16 DIAGNOSIS — D649 Anemia, unspecified: Secondary | ICD-10-CM | POA: Insufficient documentation

## 2024-01-16 DIAGNOSIS — I5031 Acute diastolic (congestive) heart failure: Secondary | ICD-10-CM | POA: Diagnosis not present

## 2024-01-16 DIAGNOSIS — C561 Malignant neoplasm of right ovary: Secondary | ICD-10-CM | POA: Insufficient documentation

## 2024-01-16 LAB — CBC WITH DIFFERENTIAL (CANCER CENTER ONLY)
Abs Immature Granulocytes: 0.01 K/uL (ref 0.00–0.07)
Basophils Absolute: 0 K/uL (ref 0.0–0.1)
Basophils Relative: 1 %
Eosinophils Absolute: 0.2 K/uL (ref 0.0–0.5)
Eosinophils Relative: 4 %
HCT: 25.2 % — ABNORMAL LOW (ref 36.0–46.0)
Hemoglobin: 8.5 g/dL — ABNORMAL LOW (ref 12.0–15.0)
Immature Granulocytes: 0 %
Lymphocytes Relative: 23 %
Lymphs Abs: 0.8 K/uL (ref 0.7–4.0)
MCH: 32.2 pg (ref 26.0–34.0)
MCHC: 33.7 g/dL (ref 30.0–36.0)
MCV: 95.5 fL (ref 80.0–100.0)
Monocytes Absolute: 0.4 K/uL (ref 0.1–1.0)
Monocytes Relative: 12 %
Neutro Abs: 2.1 K/uL (ref 1.7–7.7)
Neutrophils Relative %: 60 %
Platelet Count: 138 K/uL — ABNORMAL LOW (ref 150–400)
RBC: 2.64 MIL/uL — ABNORMAL LOW (ref 3.87–5.11)
RDW: 12.5 % (ref 11.5–15.5)
WBC Count: 3.5 K/uL — ABNORMAL LOW (ref 4.0–10.5)
nRBC: 0 % (ref 0.0–0.2)

## 2024-01-16 LAB — CMP (CANCER CENTER ONLY)
ALT: 7 U/L (ref 0–44)
AST: 15 U/L (ref 15–41)
Albumin: 4.2 g/dL (ref 3.5–5.0)
Alkaline Phosphatase: 67 U/L (ref 38–126)
Anion gap: 7 (ref 5–15)
BUN: 32 mg/dL — ABNORMAL HIGH (ref 8–23)
CO2: 21 mmol/L — ABNORMAL LOW (ref 22–32)
Calcium: 9.8 mg/dL (ref 8.9–10.3)
Chloride: 102 mmol/L (ref 98–111)
Creatinine: 1.53 mg/dL — ABNORMAL HIGH (ref 0.44–1.00)
GFR, Estimated: 36 mL/min — ABNORMAL LOW (ref >60)
Glucose, Bld: 88 mg/dL (ref 70–99)
Potassium: 4.5 mmol/L (ref 3.5–5.1)
Sodium: 130 mmol/L — ABNORMAL LOW (ref 135–145)
Total Bilirubin: 0.5 mg/dL (ref 0.0–1.2)
Total Protein: 7.1 g/dL (ref 6.5–8.1)

## 2024-01-16 LAB — RETIC PANEL
Immature Retic Fract: 1.8 % — ABNORMAL LOW (ref 2.3–15.9)
RBC.: 2.62 MIL/uL — ABNORMAL LOW (ref 3.87–5.11)
Retic Count, Absolute: 26.7 K/uL (ref 19.0–186.0)
Retic Ct Pct: 1 % (ref 0.4–3.1)
Reticulocyte Hemoglobin: 33.9 pg (ref >27.9)

## 2024-01-16 NOTE — Assessment & Plan Note (Signed)
 No significant improvement after holding chemotherapy.  No significant improvement after IV Venofer  treatments and B12 injections. 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  continue observation. I will hold off erythropoietin replacement therapy due to its angiogenesis effects and ovarian cancer history.  She can get blood transfusion if symptomatic.  Lab Results  Component Value Date   HGB 8.5 (L) 01/16/2024   TIBC 322 09/26/2023   IRONPCTSAT 21 09/26/2023   FERRITIN 326 (H) 09/26/2023    Ferritin is at goal. Hb stable.

## 2024-01-16 NOTE — Assessment & Plan Note (Signed)
Continue port flush Q6-8 weeks.

## 2024-01-16 NOTE — Progress Notes (Signed)
 Hematology/Oncology Progress note [phone] [phone]       CHIEF COMPLAINTS/PURPOSE OF VISIT Follow up for ovarian cancer.  ASSESSMENT & PLAN:   Cancer Staging  Malignant neoplasm of ovary (HCC) Staging form: Ovary, Fallopian Tube, and Primary Peritoneal Carcinoma, AJCC 8th Edition - Clinical stage from 11/22/2016: Stage IIIC (cT3c, cN1b, cM0) - Signed by Babara Call, MD on 11/22/2016   Malignant neoplasm of ovary (HCC) 01/03/2024 CT chest abdomen pelvis w contrast showed stable disease.   Labs are reviewed and discussed with patient. 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 Continue surveillance.  Recommend continue observation, and in the future, when she progresses, consider Mirvetuximab [Folate receptor alpha positive]    CHF (congestive heart failure) (HCC) Follow up with cardiology  Macrocytic anemia No significant improvement after holding chemotherapy.  No significant improvement after IV Venofer  treatments and B12 injections. 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  continue observation. I will hold off erythropoietin replacement therapy due to its angiogenesis effects and ovarian cancer Hill.  She can get blood transfusion if symptomatic.  Lab Results  Component Value Date   HGB 8.5 (L) 01/16/2024   TIBC 322 09/26/2023   IRONPCTSAT 21 09/26/2023   FERRITIN 326 (H) 09/26/2023    Ferritin is at goal. Hb stable.   Port-A-Cath in place Continue port flush Q6-8 weeks    Orders Placed This Encounter  Procedures   CBC with Differential (Cancer Hill Only)    Standing Status:   Future    Expected Date:   04/17/2024    Expiration Date:   07/16/2024   CMP (Cancer Hill only)    Standing Status:   Future    Expected Date:   04/17/2024    Expiration Date:   07/16/2024   CA 125    Standing Status:   Future    Expected Date:   04/17/2024    Expiration Date:   07/16/2024   Retic Panel    Standing Status:    Future    Expected Date:   04/17/2024    Expiration Date:   07/16/2024   Iron  and TIBC    Standing Status:   Future    Expected Date:   04/17/2024    Expiration Date:   07/16/2024   Ferritin    Standing Status:   Future    Expected Date:   04/17/2024    Expiration Date:   07/16/2024   Follow-up in 3 months.   All questions were answered. The patient knows to call the clinic with any problems, questions or concerns.  Call Babara, MD, PhD [facility] Hematology Oncology 01/16/2024      Hill OF PRESENTING ILLNESS: Katie Hill 73 y.o. female presents for follow up of management of recurrent  ovarian cancer. Oncology Hill  Malignant neoplasm of ovary (HCC)  09/20/2011 Imaging   CT chest abdomen pelvis w contrast 1. No significant interval changed compared with the previous exam.2. Stable appearance of soft tissue attenuating lesion in the posterolateral right hemipelvis. This may represent a treated site of peritoneal disease.3. Unchanged appearance of borderline enlarged left periaortic and aortocaval retroperitoneal lymph nodes.4. Unchanged 6 mm left lower lobe lung nodule. 5. No new signs of metastatic disease within the chest, abdomen or pelvis. 6. Aortic Atherosclerosis (ICD10-I70.0) and Emphysema    11/20/2016 Initial Diagnosis   Malignant neoplasm of ovary   Pathology 11/16/2016 Surgical Pathology  CASE: [id number]  PATIENT: Katie Hill  Surgical Pathology  Report   SPECIMEN SUBMITTED:  A. Retroperitoneal adenopathy, left  DIAGNOSIS:  A. LYMPH NODE, LEFT RETROPERITONEAL; CT-GUIDED CORE BIOPSY:  - METASTATIC HIGH-GRADE SEROUS CARCINOMA.  Pathology 03/14/2017   DIAGNOSIS:  A. OMENTUM; OMENTECTOMY:  - NO TUMOR SEEN.  - ONE NEGATIVE LYMPH NODE (0/1).   B.  RIGHT FALLOPIAN TUBE AND OVARY; SALPINGO-OOPHORECTOMY:  - SMALL FOCI OF HIGH GRADE SEROUS CARCINOMA INVOLVING THE OVARY.  - MARKED THERAPY RELATED CHANGE.  - NO TUMOR SEEN IN THE FALLOPIAN TUBE.   C.   UTERUS, CERVIX, LEFT FALLOPIAN TUBE AND OVARY; HYSTERECTOMY AND LEFT  SALPINGO-OOPHORECTOMY:  - NABOTHIAN CYSTS.  - CYSTIC ATROPHY OF THE ENDOMETRIUM.  - SEROSAL ADHESIONS.  - UNREMARKABLE FALLOPIAN TUBE.  - OVARY SHOWING TREATMENT RELATED CHANGE.   D.  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    12/01/2016 -  Chemotherapy   Carboplatin  and taxol  x 4 neoadjuvant chemotherapy    03/14/2017 Surgery   Patient had debulking surgery on 03/14/2017. She had a laparoscopy with conversion to laparotomy, total hysterectomy, with bilateral salpingo oophorectomy, right aortic lymph node dissection, omentectomy. Pathology showed small foci of residual disease in ovary.   HRD positive, declined Olarparib maintenance trial    03/28/2017 -  Chemotherapy   Adjuvant carbo and taxol  x3 cycles   02/28/2018 Progression   Local Recurrence. CA125 146 CT abdomen pelvis w contrast showed interval enlargement of an aortocaval lymph node which currently measures 1.4 cm in short axis (axial image 75 of series 2). This lymph node is in close proximity to the previously resected retroperitoneal lymphadenopathy and is very concerning for an additional nodal metastasis.    03/15/2018 -  Chemotherapy   03/15/2018-05/17/2018 Carboplatin  and Taxol  x 4.  CA125 decrease from 49.7 to 6 after 4 cycles of treatment.  05/17/2018-10/29/20   Olarparib 300mg  BID    11/05/2018 Imaging   MRI abdomenPelvis on 11/05/2018 showed Interval progression of, now measuring 2.7 x 2.3 cm and concerning for progression of metastatic disease.retrocaval lymph node in the abdomen Olaparib  was held for short period of time. Her CA125 trended down again.  Dr. Mancil recommending resume olaparib  and continue monitor.   10/06/2020 Progression    CT chest abdomen pelvis with contrast showed new lymph nodes in the prevascular space of the upper anterior mediastinum most  consistent with metastasis.  Single new right lower lobe pulmonary nodule is concerning for early pulmonary metastasis.  Interval enlargement of the left periaortic retroperitoneal lymph node.  Stable adjacent aorto caval adenopathy.    Genetic Testing   Genetic testing negative for 83 genes on [facility]'s Multi-Cancer panel (ALK, APC, ATM, AXIN2, BAP1, BARD1, BLM, BMPR1A, BRCA1, BRCA2, BRIP1, CASR, CDC73, CDH1, CDK4, CDKN1B, CDKN1C, CDKN2A, CEBPA, CHEK2, CTNNA1, DICER1, DIS3L2, EGFR, EPCAM, FH, FLCN, GATA2, GPC3, GREM1, HOXB13, HRAS, KIT, MAX, MEN1, MET, MITF, MLH1, MSH2, MSH3, MSH6, MUTYH, NBN, NF1, NF2, NTHL1, PALB2, PDGFRA, PHOX2B, PMS2, POLD1, POLE, POT1, PRKAR1A, PTCH1, PTEN, RAD50, RAD51C, RAD51D, RB1, RECQL4, RET, RUNX1, SDHA, SDHAF2, SDHB, SDHC, SDHD, SMAD4, SMARCA4, SMARCB1, SMARCE1, STK11, SUFU, TERC, TERT, TMEM127, TP53, TSC1, TSC2, VHL, WRN, WT1).   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  Myraid testing negative for somatic BRACA1/2, positive for HRD.    11/03/2020 - 11/18/2021 Chemotherapy   Liposomal Doxorubicin  + Bevacizumab  q28d   11/03/20 -05/20/21 Doxil  + Bevacizumab  q28d  06/03/21 Doxil  only due to proteinuria.  07/01/21-07/15/21  Resumed on Doxil  + Bevacizumab  07/29/21  Doxil  only due to proteinuria.  11/18/21 last dose of Doxil . Stopped due to CHF   10/14/2021 Echocardiogram    Left ventricular ejection fraction, by estimation, is 55 to 60%   12/16/2021 Imaging   US  lower extremities 1. No evidence of deep venous thrombosis in either lower extremity. 2. 1.9 cm left popliteal fossa Baker's cyst.   12/23/2021 Echocardiogram   LVEF 55%   12/26/2021 Imaging   CT chest abdomen pelvis w contrast   Chest Impression:   1. New RIGHT pleural effusion and new mild nodularity along the RIGHT oblique fissure. Recommend close attention on follow-up for potential pleural metastasis in the RIGHT hemithorax. 2. Stable LEFT lobe pulmonary nodule.3. No  mediastinal lymphadenopathy.   Abdomen / Pelvis Impression:   1. Stable soft tissue lesion in the deep RIGHT pelvis. 2. Mass stable small periaortic lymph nodes. 3. No intraperitoneal free fluid the abdomen or pelvis.   01/20/2022 - 01/23/2022 Hospital Admission   Hospitalized due to acute on chronic diastolic CHF, hypertension urgency, She was seen by cardiology and nephrology. BNP was significantly elevated at 4500 She was discharged home with Losartan , Aldactone , Lasix , beta blocker and hydralazine .    02/21/2022 Imaging   CT chest abdomen pelvis w contrast Stable small soft tissue lesion in the right perirectal lesion. Stable mild retroperitoneal and retrocrural lymphadenopathy.Stable 6 mm left lower lobe pulmonary nodule.No new or progressive disease within the chest, abdomen, or pelvis   02/22/2022 Imaging   CT chest abdomen pelvis w contrast Stable small soft tissue lesion in the right perirectal lesion. Stable mild retroperitoneal and retrocrural lymphadenopathy. Stable 6 mm left lower lobe pulmonary nodule. No new or progressive disease within the chest, abdomen, or pelvis.   05/11/2022 Imaging   CT chest abdomen pelvis without contrast showed 1. Stable examination without convincing evidence of new or progressive disease in the chest, abdomen or pelvis on this noncontrast enhanced CT. 2. Stable small soft tissue right perirectal soft tissue lesion.3. Stable 6 mm left lower lobe pulmonary nodule. 4. Stable prominent left supraclavicular and retroperitoneal lymph nodes. 5. Large volume of formed stool in the colon. Correlate for constipation. 6.  Aortic Atherosclerosis   07/21/2022 Bone Marrow Biopsy   bone marrow is slightly hypercellular at best with trilineage hematopoiesis.  There are generally mild dyspoietic changes present particularly in the erythroid and megakaryocytic cell lines.  No increase in blastic cells identified.  The overall changes are limited and not  considered specific or diagnostic of a myeloid neoplasm and may be secondary in nature in this setting.  Nonetheless, correlation with cytogenetic and FISH studies is recommended.  There is no diagnostic evidence of a lymphoproliferative process, metastatic carcinoma or plasma cell neoplasm.  MDS FISH negative.  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    12/12/2022 Imaging   CT chest abdomen pelvis w contrast showed 1. Similar size of the soft tissue nodule adjacent to the rectum in the posterior pelvis measures similar prior at 20 x 12 mm previously 17 x 13 mm and most recent remote imaging 19 x 11 mm. 2. Stable solid 6 mm left lower lobe pulmonary nodule. 3. No new or progressive findings in the chest, abdomen or pelvis. 4.  Aortic Atherosclerosis (ICD10-I70.0)   01/03/2024 Imaging   CT chest abdomen pelvis wo contrast showed   1. Hysterectomy, salpingooophorectomy, and omentectomy. 2. No significant change in an elongated peritoneal nodule of  the deep right hemipelvis measuring 1.8 x 0.8 cm. 3. Unchanged 0.6 cm nodule of the dependent left lower lobe, which may be faintly calcified. Multiple additional tiny nodules and scattered bronchiolar plugs are not significantly changed. 4. No new noncontrast evidence of lymphadenopathy or metastatic disease in the chest, abdomen, or pelvis.     High grade ovarian cancer [facility])    INTERVAL Hill Katie Hill is a 73 y.o. female who has above Hill reviewed by me today presents for follow up visit for recurrent Ovarian cancer Patient reports no new problems since last visit.  She denies feeling very tired or fatigued. CHF is controlled well.  Denies weight loss, fever, chills, fatigue, night sweats.     . Review of Systems  Constitutional:  Positive for fatigue. Negative for appetite change, chills and fever.  HENT:   Negative for hearing  loss and voice change.   Eyes:  Negative for eye problems.  Respiratory:  Negative for chest tightness and cough.   Cardiovascular:  Negative for chest pain and leg swelling.  Gastrointestinal:  Negative for abdominal distention, abdominal pain and blood in stool.  Endocrine: Negative for hot flashes.  Genitourinary:  Negative for difficulty urinating and frequency.   Musculoskeletal:  Negative for arthralgias.  Skin:  Negative for itching and rash.  Neurological:  Negative for extremity weakness.  Hematological:  Negative for adenopathy.  Psychiatric/Behavioral:  Negative for confusion.      Katie Hill: Past Medical Hill:  Diagnosis Date   (HFpEF) heart failure with preserved ejection fraction (HCC)    a. 12/2021 Echo: EF 55%, no rwma, nl RV fxn, mild MR, mild-mod TR. Triv AI.   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   Pelvic mass in female     SURGICAL Hill: Past Surgical Hill:  Procedure Laterality Date   APPENDECTOMY     IR BONE MARROW BIOPSY & ASPIRATION  07/21/2022   LAPAROSCOPY N/A 03/14/2017   Procedure: LAPAROSCOPY OPERATIVE;  Surgeon: Mancil Barter, MD;  Location: [facility] ORS;  Service: Gynecology;  Laterality: N/A;   LAPAROTOMY N/A 03/14/2017   Procedure: LAPAROTOMY;  Surgeon: Mancil Barter, MD;  Location: [facility] ORS;  Service: Gynecology;  Laterality: N/A;   LYMPH NODE DISSECTION N/A 03/14/2017   Procedure: LYMPH NODE DISSECTION;  Surgeon: Mancil Barter, MD;  Location: [facility] ORS;  Service: Gynecology;  Laterality: N/A;   OMENTECTOMY N/A 03/14/2017   Procedure: OMENTECTOMY;  Surgeon: Mancil Barter, MD;  Location: [facility] OR[facility];  Service: Gynecology;  Laterality: N/A;   PORTA CATH INSERTION N/A 11/27/2016   Procedure: Pat Cath  Insertion;  Surgeon: Marea Selinda RAMAN, MD;  Location: [facility];  Service: Cardiovascular;  Laterality: N/A;    SOCIAL Hill: Social Hill   Tobacco Use   Smoking status: Never   Smokeless tobacco: Never  Vaping Use   Vaping status: Never Used  Substance Use Topics   Alcohol  use: Not Currently   Drug use: No     FAMILY Hill Family Hill  Problem Relation Age of Onset   Throat cancer Cousin    Throat cancer Cousin    Leukemia Cousin     ALLERGIES:  is allergic to omeprazole .  MEDICATIONS:  Current Outpatient Medications  Medication  Sig Dispense Refill   aspirin  81 MG chewable tablet Chew 1 tablet (81 mg total) by mouth daily. 30 tablet 0   cholecalciferol (VITAMIN D3) 25 MCG (1000 UNIT) tablet Take 2,000 Units by mouth daily.     Cyanocobalamin  (B-12) 1000 MCG CAPS Take 1,000 mg by mouth daily.     empagliflozin  (JARDIANCE ) 10 MG TABS tablet Take 1 tablet (10 mg total) by mouth daily before breakfast. 90 tablet 3   feeding supplement (ENSURE ENLIVE / ENSURE PLUS) LIQD Take 237 mLs by mouth 3 (three) times daily between meals. 14220 mL 0   Ferrous Sulfate (IRON ) 325 (65 Fe) MG TABS Take 325 mg by mouth daily.     furosemide  (LASIX ) 40 MG tablet TAKE ONE TABLET BY MOUTH EVERY DAY (CAN TAKE EXTRA TABLET IN AFTERNOON IF GAIN 3 POUNDS IN A DAY OR 5 POUNDS IN A WEEK) 90 tablet 2   levothyroxine  (SYNTHROID ) 100 MCG tablet Take 1 tablet (100 mcg total) by mouth daily. 90 tablet 3   lidocaine -prilocaine  (EMLA ) cream Apply 1 application topically as needed. Apply small amount to port site at least 1 hour prior to it being accessed, cover with plastic wrap 30 g 1   ondansetron  (ZOFRAN ) 4 MG tablet Take 1 tablet (4 mg total) by mouth every 8 (eight) hours as needed for nausea or vomiting. 60 tablet 3   sacubitril -valsartan  (ENTRESTO ) 49-51 MG Take 1 tablet by mouth 2 (two) times daily. 180 tablet 3   spironolactone  (ALDACTONE ) 25 MG tablet Take 0.5 tablets (12.5 mg total)  by mouth daily.     No current facility-administered medications for this visit.   Facility-Administered Medications Ordered in Other Visits  Medication Dose Route Frequency Provider Last Rate Last Admin   pegfilgrastim  (NEULASTA  ONPRO KIT) 6 MG/0.6ML injection             PHYSICAL EXAMINATION:  ECOG PERFORMANCE STATUS: 0 - Asymptomatic Vitals:   01/16/24 1043  BP: 114/67  Pulse: 70  Resp: 18  Temp: (!) 97.4 F (36.3 C)  SpO2: 100%     Filed Weights   01/16/24 1043  Weight: 108 lb 8 oz (49.2 kg)      Physical Exam Constitutional:      General: She is not in acute distress.    Appearance: She is not diaphoretic.     Comments: Thin built, she walks independantly  HENT:     Head: Normocephalic.  Eyes:     General: No scleral icterus.    Pupils: Pupils are equal, round, and reactive to light.  Neck:     Vascular: No JVD.  Cardiovascular:     Rate and Rhythm: Normal rate.  Pulmonary:     Effort: Pulmonary effort is normal. No respiratory distress.     Breath sounds: Normal breath sounds.  Abdominal:     General: Bowel sounds are normal. There is no distension.     Palpations: Abdomen is soft.  Musculoskeletal:        General: Normal range of motion.     Cervical back: Normal range of motion and neck supple.     Right lower leg: No edema.     Left lower leg: No edema.  Lymphadenopathy:     Cervical: No cervical adenopathy.  Skin:    General: Skin is warm and dry.     Findings: No rash.     Comments: Right anterior medi port +  Bee sting   Neurological:     Mental Status:  She is alert and oriented to person, place, and time. Mental status is at baseline.     Motor: No abnormal muscle tone.  Psychiatric:        Mood and Affect: Mood and affect normal.        LABORATORY DATA: I have personally reviewed the data as listed:     Latest Ref Rng & Units 01/16/2024   10:26 AM 09/26/2023    9:39 AM 06/25/2023    9:53 AM  CBC  WBC 4.0 - 10.5 K/uL 3.5  4.1   4.0   Hemoglobin 12.0 - 15.0 g/dL 8.5  8.9  8.2   Hematocrit 36.0 - 46.0 % 25.2  26.8  25.5   Platelets 150 - 400 K/uL 138  150  186       Latest Ref Rng & Units 01/16/2024   10:26 AM 09/26/2023    9:39 AM 06/25/2023    9:53 AM  CMP  Glucose 70 - 99 mg/dL 88  85  876   BUN 8 - 23 mg/dL 32  35  31   Creatinine 0.44 - 1.00 mg/dL 8.46  8.59  8.57   Sodium 135 - 145 mmol/L 130  131  132   Potassium 3.5 - 5.1 mmol/L 4.5  4.6  4.3   Chloride 98 - 111 mmol/L 102  103  105   CO2 22 - 32 mmol/L 21  21  22    Calcium  8.9 - 10.3 mg/dL 9.8  9.5  9.3   Total Protein 6.5 - 8.1 g/dL 7.1  7.1  7.1   Total Bilirubin 0.0 - 1.2 mg/dL 0.5  0.9  0.5   Alkaline Phos 38 - 126 U/L 67  77  54   AST 15 - 41 U/L 15  17  18    ALT 0 - 44 U/L 7  10  10       RADIOGRAPHIC STUDIES: I have personally reviewed the radiological images as listed and agreed with the findings in the report. CT CHEST ABDOMEN PELVIS WO CONTRAST Result Date: 01/03/2024 CLINICAL DATA:  High-grade ovarian cancer * Tracking Code: BO * EXAM: CT CHEST, ABDOMEN AND PELVIS WITHOUT CONTRAST TECHNIQUE: Multidetector CT imaging of the chest, abdomen and pelvis was performed following the standard protocol without IV contrast. Oral enteric contrast was administered. RADIATION DOSE REDUCTION: This exam was performed according to the departmental dose-optimization program which includes automated exposure control, adjustment of the mA and/or kV according to patient size and/or use of iterative reconstruction technique. COMPARISON:  07/02/2023 FINDINGS: CT CHEST FINDINGS Cardiovascular: Right chest port catheter. Scattered aortic atherosclerosis. Normal heart size. No pericardial effusion. Mediastinum/Nodes: No enlarged mediastinal, hilar, or axillary lymph nodes. Thyroid  gland, trachea, and esophagus demonstrate no significant findings. Lungs/Pleura: Unchanged 0.6 cm nodule of the dependent left lower lobe, which may be faintly calcified (series 4, image 102).  Multiple additional tiny nodules and scattered bronchiolar plugs are not significantly changed. No pleural effusion or pneumothorax. Musculoskeletal: No chest wall abnormality. No acute osseous findings. CT ABDOMEN PELVIS FINDINGS Hepatobiliary: No solid liver abnormality is seen. No gallstones, gallbladder wall thickening, or biliary dilatation. Pancreas: Unremarkable. No pancreatic ductal dilatation or surrounding inflammatory changes. Spleen: Normal in size without significant abnormality. Adrenals/Urinary Tract: Adrenal glands are unremarkable. Kidneys are normal, without renal calculi, solid lesion, or hydronephrosis. Bladder is unremarkable. Stomach/Bowel: Stomach is within normal limits. Appendix not clearly visualized. No evidence of bowel wall thickening, distention, or inflammatory changes. Omentectomy. Vascular/Lymphatic: Aortic atherosclerosis. No enlarged abdominal or  pelvic lymph nodes. Reproductive: Hysterectomy and salpingooophorectomy. Other: No abdominal wall hernia or abnormality. No ascites. No significant change in an elongated peritoneal nodule of the deep right hemipelvis measuring 1.8 x 0.8 cm (series 2, image 109). Musculoskeletal: No acute osseous findings. IMPRESSION: 1. Hysterectomy, salpingooophorectomy, and omentectomy. 2. No significant change in an elongated peritoneal nodule of the deep right hemipelvis measuring 1.8 x 0.8 cm. 3. Unchanged 0.6 cm nodule of the dependent left lower lobe, which may be faintly calcified. Multiple additional tiny nodules and scattered bronchiolar plugs are not significantly changed. 4. No new noncontrast evidence of lymphadenopathy or metastatic disease in the chest, abdomen, or pelvis. Aortic Atherosclerosis (ICD10-I70.0). Electronically Signed   By: Marolyn JONETTA Jaksch M.D.   On: 01/03/2024 13:07   VAS US  CAROTID Result Date: 11/19/2023 Carotid Arterial Duplex Study Patient Name:  Katie Hill  Date of Exam:   11/16/2023 Medical Rec #: [id number]          Accession #:    [id number] Date of Birth: [birth year]         Patient Gender: F Patient Age:   74 years Exam Location:  [facility] Procedure:      VAS US  CAROTID Referring Phys: SELINDA GU --------------------------------------------------------------------------------  Indications:       Left bruit. Comparison Study:  11/15/2021 Performing Technologist: Leafy Gibes RVS  Examination Guidelines: A complete evaluation includes B-mode imaging, spectral Doppler, color Doppler, and power Doppler as needed of all accessible portions of each vessel. Bilateral testing is considered an integral part of a complete examination. Limited examinations for reoccurring indications may be performed as noted.  Right Carotid Findings: +----------+--------+--------+--------+------------------+--------+           PSV cm/sEDV cm/sStenosisPlaque DescriptionComments +----------+--------+--------+--------+------------------+--------+ CCA Prox  93      19                                         +----------+--------+--------+--------+------------------+--------+ CCA Mid   76      15                                         +----------+--------+--------+--------+------------------+--------+ CCA Distal68      17                                         +----------+--------+--------+--------+------------------+--------+ ICA Prox  50      17                                         +----------+--------+--------+--------+------------------+--------+ ICA Mid   79      25                                         +----------+--------+--------+--------+------------------+--------+ ICA Distal80      28                                         +----------+--------+--------+--------+------------------+--------+  ECA       67      0                                          +----------+--------+--------+--------+------------------+--------+  +----------+--------+-------+--------+-------------------+           PSV cm/sEDV cmsDescribeArm Pressure (mmHG) +----------+--------+-------+--------+-------------------+ Subclavian55      0                                  +----------+--------+-------+--------+-------------------+ +---------+--------+--+--------+--+ VertebralPSV cm/s42EDV cm/s13 +---------+--------+--+--------+--+  Left Carotid Findings: +----------+--------+--------+--------+------------------+--------+           PSV cm/sEDV cm/sStenosisPlaque DescriptionComments +----------+--------+--------+--------+------------------+--------+ CCA Prox  69      15                                         +----------+--------+--------+--------+------------------+--------+ CCA Mid   91      23                                         +----------+--------+--------+--------+------------------+--------+ CCA Distal68      20                                         +----------+--------+--------+--------+------------------+--------+ ICA Prox  45      15                                         +----------+--------+--------+--------+------------------+--------+ ICA Mid   56      19                                         +----------+--------+--------+--------+------------------+--------+ ICA Distal63      25                                         +----------+--------+--------+--------+------------------+--------+ ECA       49      10                                         +----------+--------+--------+--------+------------------+--------+ +----------+--------+--------+--------+-------------------+           PSV cm/sEDV cm/sDescribeArm Pressure (mmHG) +----------+--------+--------+--------+-------------------+ [id number]      0                                   +----------+--------+--------+--------+-------------------+ +---------+--------+--+--------+--+ VertebralPSV cm/s56EDV  cm/s18 +---------+--------+--+--------+--+   Summary: Right Carotid: Velocities in the right ICA are consistent with a 1-39% stenosis. Left Carotid: Velocities in the left ICA are consistent with a 1-39% stenosis. Vertebrals:  Bilateral vertebral arteries demonstrate antegrade  flow. Subclavians: Normal flow hemodynamics were seen in bilateral subclavian              arteries. *See table(s) above for measurements and observations.  Electronically signed by Selinda Gu MD on 11/19/2023 at 7:53:52 AM.    Final

## 2024-01-16 NOTE — Assessment & Plan Note (Addendum)
 01/03/2024 CT chest abdomen pelvis w contrast showed stable disease.   Labs are reviewed and discussed with patient. 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 Continue surveillance.  Recommend continue observation, and in the future, when she progresses, consider Mirvetuximab [Folate receptor alpha positive]

## 2024-01-16 NOTE — Assessment & Plan Note (Signed)
 Follow up with cardiology

## 2024-01-17 LAB — CA 125: Cancer Antigen (CA) 125: 13.1 U/mL (ref 0.0–38.1)

## 2024-02-27 ENCOUNTER — Inpatient Hospital Stay: Attending: Oncology

## 2024-02-27 DIAGNOSIS — Z452 Encounter for adjustment and management of vascular access device: Secondary | ICD-10-CM | POA: Diagnosis not present

## 2024-02-27 DIAGNOSIS — C561 Malignant neoplasm of right ovary: Secondary | ICD-10-CM | POA: Insufficient documentation

## 2024-04-21 ENCOUNTER — Ambulatory Visit: Payer: Self-pay | Admitting: Oncology

## 2024-04-21 ENCOUNTER — Encounter: Payer: Self-pay | Admitting: Oncology

## 2024-04-21 ENCOUNTER — Inpatient Hospital Stay: Attending: Oncology | Admitting: Oncology

## 2024-04-21 ENCOUNTER — Inpatient Hospital Stay: Attending: Oncology

## 2024-04-21 VITALS — BP 121/70 | HR 65 | Temp 96.0°F | Resp 18 | Wt 108.0 lb

## 2024-04-21 DIAGNOSIS — C569 Malignant neoplasm of unspecified ovary: Secondary | ICD-10-CM

## 2024-04-21 DIAGNOSIS — D539 Nutritional anemia, unspecified: Secondary | ICD-10-CM | POA: Diagnosis not present

## 2024-04-21 DIAGNOSIS — Z95828 Presence of other vascular implants and grafts: Secondary | ICD-10-CM | POA: Diagnosis not present

## 2024-04-21 DIAGNOSIS — C563 Malignant neoplasm of bilateral ovaries: Secondary | ICD-10-CM | POA: Diagnosis not present

## 2024-04-21 DIAGNOSIS — I5031 Acute diastolic (congestive) heart failure: Secondary | ICD-10-CM | POA: Diagnosis not present

## 2024-04-21 LAB — CMP (CANCER CENTER ONLY)
ALT: <5 U/L (ref 0–44)
AST: 15 U/L (ref 15–41)
Albumin: 4.4 g/dL (ref 3.5–5.0)
Alkaline Phosphatase: 79 U/L (ref 38–126)
Anion gap: 11 (ref 5–15)
BUN: 28 mg/dL — ABNORMAL HIGH (ref 8–23)
CO2: 19 mmol/L — ABNORMAL LOW (ref 22–32)
Calcium: 10 mg/dL (ref 8.9–10.3)
Chloride: 101 mmol/L (ref 98–111)
Creatinine: 1.44 mg/dL — ABNORMAL HIGH (ref 0.44–1.00)
GFR, Estimated: 38 mL/min — ABNORMAL LOW
Glucose, Bld: 86 mg/dL (ref 70–99)
Potassium: 4.3 mmol/L (ref 3.5–5.1)
Sodium: 130 mmol/L — ABNORMAL LOW (ref 135–145)
Total Bilirubin: 0.3 mg/dL (ref 0.0–1.2)
Total Protein: 7.2 g/dL (ref 6.5–8.1)

## 2024-04-21 LAB — CBC WITH DIFFERENTIAL (CANCER CENTER ONLY)
Abs Immature Granulocytes: 0.01 K/uL (ref 0.00–0.07)
Basophils Absolute: 0 K/uL (ref 0.0–0.1)
Basophils Relative: 1 %
Eosinophils Absolute: 0.1 K/uL (ref 0.0–0.5)
Eosinophils Relative: 3 %
HCT: 26.9 % — ABNORMAL LOW (ref 36.0–46.0)
Hemoglobin: 9 g/dL — ABNORMAL LOW (ref 12.0–15.0)
Immature Granulocytes: 0 %
Lymphocytes Relative: 28 %
Lymphs Abs: 1.1 K/uL (ref 0.7–4.0)
MCH: 31.7 pg (ref 26.0–34.0)
MCHC: 33.5 g/dL (ref 30.0–36.0)
MCV: 94.7 fL (ref 80.0–100.0)
Monocytes Absolute: 0.5 K/uL (ref 0.1–1.0)
Monocytes Relative: 11 %
Neutro Abs: 2.4 K/uL (ref 1.7–7.7)
Neutrophils Relative %: 57 %
Platelet Count: 158 K/uL (ref 150–400)
RBC: 2.84 MIL/uL — ABNORMAL LOW (ref 3.87–5.11)
RDW: 12.2 % (ref 11.5–15.5)
WBC Count: 4.1 K/uL (ref 4.0–10.5)
nRBC: 0 % (ref 0.0–0.2)

## 2024-04-21 LAB — RETIC PANEL
Immature Retic Fract: 3.8 % (ref 2.3–15.9)
RBC.: 2.85 MIL/uL — ABNORMAL LOW (ref 3.87–5.11)
Retic Count, Absolute: 29.9 K/uL (ref 19.0–186.0)
Retic Ct Pct: 1.1 % (ref 0.4–3.1)
Reticulocyte Hemoglobin: 34.4 pg

## 2024-04-21 LAB — IRON AND TIBC
Iron: 63 ug/dL (ref 28–170)
Saturation Ratios: 21 % (ref 10.4–31.8)
TIBC: 300 ug/dL (ref 250–450)
UIBC: 236 ug/dL

## 2024-04-21 LAB — FERRITIN: Ferritin: 589 ng/mL — ABNORMAL HIGH (ref 11–307)

## 2024-04-21 NOTE — Assessment & Plan Note (Signed)
 No significant improvement after holding chemotherapy.  No significant improvement after IV Venofer  treatments and B12 injections. 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  continue observation. I will hold off erythropoietin replacement therapy due to its angiogenesis effects and ovarian cancer history.  She can get blood transfusion if symptomatic.  Lab Results  Component Value Date   HGB 9.0 (L) 04/21/2024   TIBC 300 04/21/2024   IRONPCTSAT 21 04/21/2024   FERRITIN 589 (H) 04/21/2024    Ferritin is at goal. Hb stable.

## 2024-04-21 NOTE — Progress Notes (Signed)
 Hematology/Oncology Progress note [phone] [phone]       CHIEF COMPLAINTS/PURPOSE OF VISIT Follow up for ovarian cancer.  ASSESSMENT & PLAN:   Cancer Staging  Malignant neoplasm of ovary (HCC) Staging form: Ovary, Fallopian Tube, and Primary Peritoneal Carcinoma, AJCC 8th Edition - Clinical stage from 11/22/2016: Stage IIIC (cT3c, cN1b, cM0) - Signed by Babara Call, MD on 11/22/2016   Malignant neoplasm of ovary (HCC) 01/03/2024 CT chest abdomen pelvis w contrast showed stable disease.   Labs are reviewed and discussed with patient. 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 Continue surveillance. Repeat CT prior to next visit.   Recommend continue observation, and in the future, when she progresses, consider Mirvetuximab [Folate receptor alpha positive]    CHF (congestive heart failure) (HCC) Follow up with cardiology  Macrocytic anemia No significant improvement after holding chemotherapy.  No significant improvement after IV Venofer  treatments and B12 injections. 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  continue observation. I will hold off erythropoietin replacement therapy due to its angiogenesis effects and ovarian cancer Hill.  She can get blood transfusion if symptomatic.  Lab Results  Component Value Date   HGB 9.0 (L) 04/21/2024   TIBC 300 04/21/2024   IRONPCTSAT 21 04/21/2024   FERRITIN 589 (H) 04/21/2024    Ferritin is at goal. Hb stable.   Port-A-Cath in place Continue port flush Q6-8 weeks    Orders Placed This Encounter  Procedures   CT CHEST ABDOMEN PELVIS WO CONTRAST    Standing Status:   Future    Expected Date:   07/02/2024    Expiration Date:   04/21/2025    Preferred imaging location?:   [facility]    If indicated for the ordered procedure, I authorize the administration of oral contrast media per Radiology protocol:   Yes    Does the patient have a contrast media/X-ray  dye allergy?:   No   CBC with Differential (Cancer Center Only)    Standing Status:   Future    Expected Date:   07/20/2024    Expiration Date:   10/18/2024   CMP (Cancer Center only)    Standing Status:   Future    Expected Date:   07/20/2024    Expiration Date:   10/18/2024   CA 125    Standing Status:   Future    Expected Date:   07/20/2024    Expiration Date:   10/18/2024   Iron  and TIBC    Standing Status:   Future    Expected Date:   07/20/2024    Expiration Date:   10/18/2024   Ferritin    Standing Status:   Future    Expected Date:   07/20/2024    Expiration Date:   10/18/2024   Follow-up in 3 months.   All questions were answered. The patient knows to call the clinic with any problems, questions or concerns.  Call Babara, MD, PhD [facility] Hematology Oncology 04/21/2024      Hill OF PRESENTING ILLNESS: Katie Hill 74 y.o. female presents for follow up of management of recurrent  ovarian cancer. Oncology Hill  Malignant neoplasm of ovary (HCC)  09/20/2011 Imaging   CT chest abdomen pelvis w contrast 1. No significant interval changed compared with the previous exam.2. Stable appearance of soft tissue attenuating lesion in the posterolateral right hemipelvis. This may represent a treated site of peritoneal disease.3. Unchanged appearance of borderline enlarged left periaortic and  aortocaval retroperitoneal lymph nodes.4. Unchanged 6 mm left lower lobe lung nodule. 5. No new signs of metastatic disease within the chest, abdomen or pelvis. 6. Aortic Atherosclerosis (ICD10-I70.0) and Emphysema    11/20/2016 Initial Diagnosis   Malignant neoplasm of ovary   Pathology 11/16/2016 Surgical Pathology  CASE: [id number]  PATIENT: Katie Hill  Surgical Pathology Report   SPECIMEN SUBMITTED:  A. Retroperitoneal adenopathy, left  DIAGNOSIS:  A. LYMPH NODE, LEFT RETROPERITONEAL; CT-GUIDED CORE BIOPSY:  - METASTATIC HIGH-GRADE SEROUS CARCINOMA.  Pathology  03/14/2017   DIAGNOSIS:  A. OMENTUM; OMENTECTOMY:  - NO TUMOR SEEN.  - ONE NEGATIVE LYMPH NODE (0/1).   B.  RIGHT FALLOPIAN TUBE AND OVARY; SALPINGO-OOPHORECTOMY:  - SMALL FOCI OF HIGH GRADE SEROUS CARCINOMA INVOLVING THE OVARY.  - MARKED THERAPY RELATED CHANGE.  - NO TUMOR SEEN IN THE FALLOPIAN TUBE.   C.  UTERUS, CERVIX, LEFT FALLOPIAN TUBE AND OVARY; HYSTERECTOMY AND LEFT  SALPINGO-OOPHORECTOMY:  - NABOTHIAN CYSTS.  - CYSTIC ATROPHY OF THE ENDOMETRIUM.  - SEROSAL ADHESIONS.  - UNREMARKABLE FALLOPIAN TUBE.  - OVARY SHOWING TREATMENT RELATED CHANGE.   D.  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    12/01/2016 -  Chemotherapy   Carboplatin  and taxol  x 4 neoadjuvant chemotherapy    03/14/2017 Surgery   Patient had debulking surgery on 03/14/2017. She had a laparoscopy with conversion to laparotomy, total hysterectomy, with bilateral salpingo oophorectomy, right aortic lymph node dissection, omentectomy. Pathology showed small foci of residual disease in ovary.   HRD positive, declined Olarparib maintenance trial    03/28/2017 -  Chemotherapy   Adjuvant carbo and taxol  x3 cycles   02/28/2018 Progression   Local Recurrence. CA125 146 CT abdomen pelvis w contrast showed interval enlargement of an aortocaval lymph node which currently measures 1.4 cm in short axis (axial image 75 of series 2). This lymph node is in close proximity to the previously resected retroperitoneal lymphadenopathy and is very concerning for an additional nodal metastasis.    03/15/2018 -  Chemotherapy   03/15/2018-05/17/2018 Carboplatin  and Taxol  x 4.  CA125 decrease from 49.7 to 6 after 4 cycles of treatment.  05/17/2018-10/29/20   Olarparib 300mg  BID    11/05/2018 Imaging   MRI abdomenPelvis on 11/05/2018 showed Interval progression of, now measuring 2.7 x 2.3 cm and concerning for progression of metastatic disease.retrocaval lymph  node in the abdomen Olaparib  was held for short period of time. Her CA125 trended down again.  Dr. Mancil recommending resume olaparib  and continue monitor.   10/06/2020 Progression    CT chest abdomen pelvis with contrast showed new lymph nodes in the prevascular space of the upper anterior mediastinum most consistent with metastasis.  Single new right lower lobe pulmonary nodule is concerning for early pulmonary metastasis.  Interval enlargement of the left periaortic retroperitoneal lymph node.  Stable adjacent aorto caval adenopathy.    Genetic Testing   Genetic testing negative for 83 genes on [facility]'s Multi-Cancer panel (ALK, APC, ATM, AXIN2, BAP1, BARD1, BLM, BMPR1A, BRCA1, BRCA2, BRIP1, CASR, CDC73, CDH1, CDK4, CDKN1B, CDKN1C, CDKN2A, CEBPA, CHEK2, CTNNA1, DICER1, DIS3L2, EGFR, EPCAM, FH, FLCN, GATA2, GPC3, GREM1, HOXB13, HRAS, KIT, MAX, MEN1, MET, MITF, MLH1, MSH2, MSH3, MSH6, MUTYH, NBN, NF1, NF2, NTHL1, PALB2, PDGFRA, PHOX2B, PMS2, POLD1, POLE, POT1, PRKAR1A, PTCH1, PTEN, RAD50, RAD51C, RAD51D, RB1, RECQL4, RET, RUNX1, SDHA, SDHAF2, SDHB, SDHC, SDHD, SMAD4, SMARCA4, SMARCB1, SMARCE1, STK11, SUFU, TERC, TERT, TMEM127, TP53, TSC1, TSC2, VHL, WRN, WT1).  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  Myraid testing negative for somatic BRACA1/2, positive for HRD.    11/03/2020 - 11/18/2021 Chemotherapy   Liposomal Doxorubicin  + Bevacizumab  q28d   11/03/20 -05/20/21 Doxil  + Bevacizumab  q28d  06/03/21 Doxil  only due to proteinuria.  07/01/21-07/15/21  Resumed on Doxil  + Bevacizumab  07/29/21 Doxil  only due to proteinuria.  11/18/21 last dose of Doxil . Stopped due to CHF   10/14/2021 Echocardiogram    Left ventricular ejection fraction, by estimation, is 55 to 60%   12/16/2021 Imaging   US  lower extremities 1. No evidence of deep venous thrombosis in either lower extremity. 2. 1.9 cm left popliteal fossa Baker's cyst.   12/23/2021 Echocardiogram   LVEF 55%    12/26/2021 Imaging   CT chest abdomen pelvis w contrast   Chest Impression:   1. New RIGHT pleural effusion and new mild nodularity along the RIGHT oblique fissure. Recommend close attention on follow-up for potential pleural metastasis in the RIGHT hemithorax. 2. Stable LEFT lobe pulmonary nodule.3. No mediastinal lymphadenopathy.   Abdomen / Pelvis Impression:   1. Stable soft tissue lesion in the deep RIGHT pelvis. 2. Mass stable small periaortic lymph nodes. 3. No intraperitoneal free fluid the abdomen or pelvis.   01/20/2022 - 01/23/2022 Hospital Admission   Hospitalized due to acute on chronic diastolic CHF, hypertension urgency, She was seen by cardiology and nephrology. BNP was significantly elevated at 4500 She was discharged home with Losartan , Aldactone , Lasix , beta blocker and hydralazine .    02/21/2022 Imaging   CT chest abdomen pelvis w contrast Stable small soft tissue lesion in the right perirectal lesion. Stable mild retroperitoneal and retrocrural lymphadenopathy.Stable 6 mm left lower lobe pulmonary nodule.No new or progressive disease within the chest, abdomen, or pelvis   02/22/2022 Imaging   CT chest abdomen pelvis w contrast Stable small soft tissue lesion in the right perirectal lesion. Stable mild retroperitoneal and retrocrural lymphadenopathy. Stable 6 mm left lower lobe pulmonary nodule. No new or progressive disease within the chest, abdomen, or pelvis.   05/11/2022 Imaging   CT chest abdomen pelvis without contrast showed 1. Stable examination without convincing evidence of new or progressive disease in the chest, abdomen or pelvis on this noncontrast enhanced CT. 2. Stable small soft tissue right perirectal soft tissue lesion.3. Stable 6 mm left lower lobe pulmonary nodule. 4. Stable prominent left supraclavicular and retroperitoneal lymph nodes. 5. Large volume of formed stool in the colon. Correlate for constipation. 6.  Aortic Atherosclerosis    07/21/2022 Bone Marrow Biopsy   bone marrow is slightly hypercellular at best with trilineage hematopoiesis.  There are generally mild dyspoietic changes present particularly in the erythroid and megakaryocytic cell lines.  No increase in blastic cells identified.  The overall changes are limited and not considered specific or diagnostic of a myeloid neoplasm and may be secondary in nature in this setting.  Nonetheless, correlation with cytogenetic and FISH studies is recommended.  There is no diagnostic evidence of a lymphoproliferative process, metastatic carcinoma or plasma cell neoplasm.  MDS FISH negative.  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    12/12/2022 Imaging   CT chest abdomen pelvis w contrast showed 1. Similar size of the soft tissue nodule adjacent to the rectum in the posterior pelvis measures similar prior at 20 x 12 mm previously 17 x 13 mm and most recent remote imaging 19 x 11  mm. 2. Stable solid 6 mm left lower lobe pulmonary nodule. 3. No new or progressive findings in the chest, abdomen or pelvis. 4.  Aortic Atherosclerosis (ICD10-I70.0)   01/03/2024 Imaging   CT chest abdomen pelvis wo contrast showed   1. Hysterectomy, salpingooophorectomy, and omentectomy. 2. No significant change in an elongated peritoneal nodule of the deep right hemipelvis measuring 1.8 x 0.8 cm. 3. Unchanged 0.6 cm nodule of the dependent left lower lobe, which may be faintly calcified. Multiple additional tiny nodules and scattered bronchiolar plugs are not significantly changed. 4. No new noncontrast evidence of lymphadenopathy or metastatic disease in the chest, abdomen, or pelvis.     High grade ovarian cancer [facility])    INTERVAL Hill Katie Hill is a 74 y.o. female who has above Hill reviewed by me today presents for follow up visit for recurrent Ovarian cancer Patient reports no  new problems since last visit.  She denies feeling very tired or fatigued. CHF is controlled well.  Denies weight loss, fever, chills, fatigue, night sweats.     . Review of Systems  Constitutional:  Positive for fatigue. Negative for appetite change, chills and fever.  HENT:   Negative for hearing loss and voice change.   Eyes:  Negative for eye problems.  Respiratory:  Negative for chest tightness and cough.   Cardiovascular:  Negative for chest pain and leg swelling.  Gastrointestinal:  Negative for abdominal distention, abdominal pain and blood in stool.  Endocrine: Negative for hot flashes.  Genitourinary:  Negative for difficulty urinating and frequency.   Musculoskeletal:  Negative for arthralgias.  Skin:  Negative for itching and rash.  Neurological:  Negative for extremity weakness.  Hematological:  Negative for adenopathy.  Psychiatric/Behavioral:  Negative for confusion.      Katie Hill: Past Medical Hill:  Diagnosis Date   (HFpEF) heart failure with preserved ejection fraction (HCC)    a. 12/2021 Echo: EF 55%, no rwma, nl RV fxn, mild MR, mild-mod TR. Triv AI.   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   Pelvic mass in female     SURGICAL Hill: Past Surgical Hill:  Procedure Laterality Date   APPENDECTOMY     IR BONE MARROW BIOPSY & ASPIRATION  07/21/2022   LAPAROSCOPY N/A 03/14/2017   Procedure: LAPAROSCOPY OPERATIVE;  Surgeon: Mancil Barter, MD;  Location: [facility] ORS;  Service: Gynecology;  Laterality: N/A;   LAPAROTOMY N/A 03/14/2017   Procedure: LAPAROTOMY;  Surgeon: Mancil Barter, MD;  Location: [facility] OR[facility];  Service: Gynecology;  Laterality: N/A;   LYMPH NODE DISSECTION N/A 03/14/2017   Procedure: LYMPH NODE  DISSECTION;  Surgeon: Mancil Barter, MD;  Location: [facility] ORS;  Service: Gynecology;  Laterality: N/A;   OMENTECTOMY N/A 03/14/2017   Procedure: OMENTECTOMY;  Surgeon: Mancil Barter, MD;  Location: [facility] OR[facility];  Service: Gynecology;  Laterality: N/A;   PORTA CATH INSERTION N/A 11/27/2016   Procedure: Pat Cath Insertion;  Surgeon: Marea Selinda RAMAN, MD;  Location: [facility];  Service: Cardiovascular;  Laterality: N/A;    SOCIAL Hill: Social Hill   Tobacco Use   Smoking status: Never   Smokeless tobacco: Never  Vaping Use   Vaping status: Never Used  Substance Use Topics  Alcohol  use: Not Currently   Drug use: No     FAMILY Hill Family Hill  Problem Relation Age of Onset   Throat cancer Cousin    Throat cancer Cousin    Leukemia Cousin     ALLERGIES:  is allergic to omeprazole .  MEDICATIONS:  Current Outpatient Medications  Medication Sig Dispense Refill   aspirin  81 MG chewable tablet Chew 1 tablet (81 mg total) by mouth daily. 30 tablet 0   cholecalciferol (VITAMIN D3) 25 MCG (1000 UNIT) tablet Take 2,000 Units by mouth daily.     Cyanocobalamin  (B-12) 1000 MCG CAPS Take 1,000 mg by mouth daily.     empagliflozin  (JARDIANCE ) 10 MG TABS tablet Take 1 tablet (10 mg total) by mouth daily before breakfast. 90 tablet 3   feeding supplement (ENSURE ENLIVE / ENSURE PLUS) LIQD Take 237 mLs by mouth 3 (three) times daily between meals. 14220 mL 0   Ferrous Sulfate (IRON ) 325 (65 Fe) MG TABS Take 325 mg by mouth daily.     furosemide  (LASIX ) 40 MG tablet TAKE ONE TABLET BY MOUTH EVERY DAY (CAN TAKE EXTRA TABLET IN AFTERNOON IF GAIN 3 POUNDS IN A DAY OR 5 POUNDS IN A WEEK) 90 tablet 2   levothyroxine  (SYNTHROID ) 100 MCG tablet Take 1 tablet (100 mcg total) by mouth daily. 90 tablet 3   lidocaine -prilocaine  (EMLA ) cream Apply 1 application topically as needed. Apply small amount to port site at least 1 hour prior to it being accessed, cover with plastic wrap 30 g 1    ondansetron  (ZOFRAN ) 4 MG tablet Take 1 tablet (4 mg total) by mouth every 8 (eight) hours as needed for nausea or vomiting. 60 tablet 3   sacubitril -valsartan  (ENTRESTO ) 49-51 MG Take 1 tablet by mouth 2 (two) times daily. 180 tablet 3   spironolactone  (ALDACTONE ) 25 MG tablet Take 0.5 tablets (12.5 mg total) by mouth daily.     No current facility-administered medications for this visit.   Facility-Administered Medications Ordered in Other Visits  Medication Dose Route Frequency Provider Last Rate Last Admin   pegfilgrastim  (NEULASTA  ONPRO KIT) 6 MG/0.6ML injection             PHYSICAL EXAMINATION:  ECOG PERFORMANCE STATUS: 0 - Asymptomatic Vitals:   04/21/24 1042  BP: 121/70  Pulse: 65  Resp: 18  Temp: (!) 96 F (35.6 C)  SpO2: 100%     Filed Weights   04/21/24 1042  Weight: 108 lb (49 kg)      Physical Exam Constitutional:      General: She is not in acute distress.    Appearance: She is not diaphoretic.     Comments: Thin built, she walks independantly  HENT:     Head: Normocephalic.  Eyes:     General: No scleral icterus.    Pupils: Pupils are equal, round, and reactive to light.  Neck:     Vascular: No JVD.  Cardiovascular:     Rate and Rhythm: Normal rate.  Pulmonary:     Effort: Pulmonary effort is normal. No respiratory distress.     Breath sounds: Normal breath sounds.  Abdominal:     General: Bowel sounds are normal. There is no distension.     Palpations: Abdomen is soft.  Musculoskeletal:        General: Normal range of motion.     Cervical back: Normal range of motion and neck supple.     Right lower leg: No edema.  Left lower leg: No edema.  Lymphadenopathy:     Cervical: No cervical adenopathy.  Skin:    General: Skin is warm and dry.     Findings: No rash.     Comments: Right anterior medi port +  Bee sting   Neurological:     Mental Status: She is alert and oriented to person, place, and time. Mental status is at baseline.      Motor: No abnormal muscle tone.  Psychiatric:        Mood and Affect: Mood and affect normal.        LABORATORY DATA: I have personally reviewed the data as listed:     Latest Ref Rng & Units 04/21/2024   10:33 AM 01/16/2024   10:26 AM 09/26/2023    9:39 AM  CBC  WBC 4.0 - 10.5 K/uL 4.1  3.5  4.1   Hemoglobin 12.0 - 15.0 g/dL 9.0  8.5  8.9   Hematocrit 36.0 - 46.0 % 26.9  25.2  26.8   Platelets 150 - 400 K/uL 158  138  150       Latest Ref Rng & Units 04/21/2024   10:33 AM 01/16/2024   10:26 AM 09/26/2023    9:39 AM  CMP  Glucose 70 - 99 mg/dL 86  88  85   BUN 8 - 23 mg/dL 28  32  35   Creatinine 0.44 - 1.00 mg/dL 8.55  8.46  8.59   Sodium 135 - 145 mmol/L 130  130  131   Potassium 3.5 - 5.1 mmol/L 4.3  4.5  4.6   Chloride 98 - 111 mmol/L 101  102  103   CO2 22 - 32 mmol/L 19  21  21    Calcium  8.9 - 10.3 mg/dL 89.9  9.8  9.5   Total Protein 6.5 - 8.1 g/dL 7.2  7.1  7.1   Total Bilirubin 0.0 - 1.2 mg/dL 0.3  0.5  0.9   Alkaline Phos 38 - 126 U/L 79  67  77   AST 15 - 41 U/L 15  15  17    ALT 0 - 44 U/L 5  7  10       RADIOGRAPHIC STUDIES: I have personally reviewed the radiological images as listed and agreed with the findings in the report. No results found.

## 2024-04-21 NOTE — Assessment & Plan Note (Signed)
 Follow up with cardiology

## 2024-04-21 NOTE — Assessment & Plan Note (Addendum)
 01/03/2024 CT chest abdomen pelvis w contrast showed stable disease.   Labs are reviewed and discussed with patient. 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 Continue surveillance. Repeat CT prior to next visit.   Recommend continue observation, and in the future, when she progresses, consider Mirvetuximab [Folate receptor alpha positive]

## 2024-04-21 NOTE — Assessment & Plan Note (Signed)
Continue port flush Q6-8 weeks.

## 2024-04-22 LAB — CA 125: Cancer Antigen (CA) 125: 13 U/mL (ref 0.0–38.1)

## 2024-04-22 NOTE — Progress Notes (Signed)
 Patient notified and verbalized understanding.

## 2024-06-03 ENCOUNTER — Inpatient Hospital Stay

## 2024-07-08 ENCOUNTER — Other Ambulatory Visit

## 2024-07-22 ENCOUNTER — Inpatient Hospital Stay: Admitting: Oncology

## 2024-07-22 ENCOUNTER — Inpatient Hospital Stay

## 2025-11-17 ENCOUNTER — Ambulatory Visit (INDEPENDENT_AMBULATORY_CARE_PROVIDER_SITE_OTHER): Admitting: Vascular Surgery

## 2025-11-17 ENCOUNTER — Encounter (INDEPENDENT_AMBULATORY_CARE_PROVIDER_SITE_OTHER)
